# Patient Record
Sex: Male | Born: 1945 | Race: Black or African American | Hispanic: No | Marital: Single | Smoking: Never smoker
Health system: Southern US, Community
[De-identification: ages and names within clinical notes are randomized; demographics above are authoritative.]

## PROBLEM LIST (undated history)

## (undated) DIAGNOSIS — E058 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: Secondary | ICD-10-CM

## (undated) DIAGNOSIS — I639 Cerebral infarction, unspecified: Secondary | ICD-10-CM

## (undated) DIAGNOSIS — Z9981 Dependence on supplemental oxygen: Secondary | ICD-10-CM

## (undated) DIAGNOSIS — R279 Unspecified lack of coordination: Secondary | ICD-10-CM

## (undated) DIAGNOSIS — K649 Unspecified hemorrhoids: Secondary | ICD-10-CM

## (undated) DIAGNOSIS — K5900 Constipation, unspecified: Secondary | ICD-10-CM

## (undated) DIAGNOSIS — D72829 Elevated white blood cell count, unspecified: Secondary | ICD-10-CM

## (undated) DIAGNOSIS — J189 Pneumonia, unspecified organism: Secondary | ICD-10-CM

## (undated) DIAGNOSIS — E870 Hyperosmolality and hypernatremia: Secondary | ICD-10-CM

## (undated) DIAGNOSIS — I2699 Other pulmonary embolism without acute cor pulmonale: Secondary | ICD-10-CM

## (undated) DIAGNOSIS — R0981 Nasal congestion: Secondary | ICD-10-CM

## (undated) DIAGNOSIS — A419 Sepsis, unspecified organism: Secondary | ICD-10-CM

## (undated) DIAGNOSIS — I10 Essential (primary) hypertension: Secondary | ICD-10-CM

## (undated) DIAGNOSIS — H179 Unspecified corneal scar and opacity: Secondary | ICD-10-CM

## (undated) DIAGNOSIS — E1165 Type 2 diabetes mellitus with hyperglycemia: Secondary | ICD-10-CM

## (undated) DIAGNOSIS — M6281 Muscle weakness (generalized): Secondary | ICD-10-CM

## (undated) DIAGNOSIS — J9601 Acute respiratory failure with hypoxia: Secondary | ICD-10-CM

## (undated) DIAGNOSIS — R509 Fever, unspecified: Secondary | ICD-10-CM

## (undated) DIAGNOSIS — N390 Urinary tract infection, site not specified: Secondary | ICD-10-CM

## (undated) DIAGNOSIS — K760 Fatty (change of) liver, not elsewhere classified: Secondary | ICD-10-CM

## (undated) DIAGNOSIS — E785 Hyperlipidemia, unspecified: Secondary | ICD-10-CM

## (undated) DIAGNOSIS — K562 Volvulus: Secondary | ICD-10-CM

## (undated) DIAGNOSIS — R4702 Dysphasia: Secondary | ICD-10-CM

## (undated) DIAGNOSIS — Z741 Need for assistance with personal care: Secondary | ICD-10-CM

## (undated) DIAGNOSIS — G8190 Hemiplegia, unspecified affecting unspecified side: Secondary | ICD-10-CM

## (undated) HISTORY — DX: Constipation, unspecified: K59.00

## (undated) HISTORY — DX: Sepsis, unspecified organism: A41.9

## (undated) HISTORY — DX: Unspecified corneal scar and opacity: H17.9

## (undated) HISTORY — DX: Muscle weakness (generalized): M62.81

## (undated) HISTORY — DX: Hyperosmolality and hypernatremia: E87.0

## (undated) HISTORY — DX: 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: E05.80

## (undated) HISTORY — DX: Hyperlipidemia, unspecified: E78.5

## (undated) HISTORY — DX: Unspecified lack of coordination: R27.9

## (undated) HISTORY — DX: Nasal congestion: R09.81

## (undated) HISTORY — PX: OTHER SURGICAL HISTORY: SHX169

## (undated) HISTORY — DX: Elevated white blood cell count, unspecified: D72.829

## (undated) HISTORY — DX: Fever, unspecified: R50.9

## (undated) HISTORY — DX: Volvulus: K56.2

## (undated) HISTORY — DX: Dependence on supplemental oxygen: Z99.81

## (undated) HISTORY — DX: Unspecified hemorrhoids: K64.9

## (undated) HISTORY — DX: Acute respiratory failure with hypoxia: J96.01

## (undated) HISTORY — DX: Need for assistance with personal care: Z74.1

## (undated) HISTORY — DX: Urinary tract infection, site not specified: N39.0

## (undated) HISTORY — DX: Type 2 diabetes mellitus with hyperglycemia: E11.65

---

## 2002-05-25 ENCOUNTER — Encounter: Payer: Self-pay | Admitting: Internal Medicine

## 2002-05-25 ENCOUNTER — Emergency Department (HOSPITAL_COMMUNITY): Admission: EM | Admit: 2002-05-25 | Discharge: 2002-05-25 | Payer: Self-pay | Admitting: Internal Medicine

## 2002-12-04 ENCOUNTER — Encounter: Payer: Self-pay | Admitting: Internal Medicine

## 2002-12-04 ENCOUNTER — Ambulatory Visit (HOSPITAL_COMMUNITY): Admission: RE | Admit: 2002-12-04 | Discharge: 2002-12-04 | Payer: Self-pay | Admitting: Internal Medicine

## 2002-12-14 ENCOUNTER — Encounter: Payer: Self-pay | Admitting: *Deleted

## 2002-12-14 ENCOUNTER — Emergency Department (HOSPITAL_COMMUNITY): Admission: EM | Admit: 2002-12-14 | Discharge: 2002-12-15 | Payer: Self-pay | Admitting: *Deleted

## 2003-03-22 ENCOUNTER — Ambulatory Visit (HOSPITAL_COMMUNITY): Admission: RE | Admit: 2003-03-22 | Discharge: 2003-03-22 | Payer: Self-pay | Admitting: Internal Medicine

## 2003-03-22 ENCOUNTER — Encounter: Payer: Self-pay | Admitting: Internal Medicine

## 2003-03-27 ENCOUNTER — Ambulatory Visit (HOSPITAL_COMMUNITY): Admission: RE | Admit: 2003-03-27 | Discharge: 2003-03-27 | Payer: Self-pay | Admitting: Internal Medicine

## 2003-03-27 ENCOUNTER — Encounter: Payer: Self-pay | Admitting: Internal Medicine

## 2003-05-09 ENCOUNTER — Inpatient Hospital Stay: Admission: AD | Admit: 2003-05-09 | Discharge: 2003-07-26 | Payer: Self-pay | Admitting: Internal Medicine

## 2003-07-11 ENCOUNTER — Ambulatory Visit (HOSPITAL_COMMUNITY): Admission: RE | Admit: 2003-07-11 | Discharge: 2003-07-11 | Payer: Self-pay | Admitting: Internal Medicine

## 2003-07-26 ENCOUNTER — Inpatient Hospital Stay (HOSPITAL_COMMUNITY): Admission: EM | Admit: 2003-07-26 | Discharge: 2003-08-01 | Payer: Self-pay | Admitting: Emergency Medicine

## 2003-07-26 ENCOUNTER — Ambulatory Visit (HOSPITAL_COMMUNITY): Admission: RE | Admit: 2003-07-26 | Discharge: 2003-07-26 | Payer: Self-pay | Admitting: Internal Medicine

## 2003-08-01 ENCOUNTER — Inpatient Hospital Stay: Admission: AD | Admit: 2003-08-01 | Discharge: 2005-01-03 | Payer: Self-pay | Admitting: Internal Medicine

## 2003-09-10 ENCOUNTER — Emergency Department (HOSPITAL_COMMUNITY): Admission: EM | Admit: 2003-09-10 | Discharge: 2003-09-10 | Payer: Self-pay | Admitting: Emergency Medicine

## 2003-10-24 ENCOUNTER — Ambulatory Visit (HOSPITAL_COMMUNITY): Admission: RE | Admit: 2003-10-24 | Discharge: 2003-10-24 | Payer: Self-pay | Admitting: Internal Medicine

## 2003-11-04 ENCOUNTER — Ambulatory Visit (HOSPITAL_COMMUNITY): Admission: RE | Admit: 2003-11-04 | Discharge: 2003-11-04 | Payer: Self-pay | Admitting: Internal Medicine

## 2004-04-14 ENCOUNTER — Ambulatory Visit (HOSPITAL_COMMUNITY): Admission: RE | Admit: 2004-04-14 | Discharge: 2004-04-14 | Payer: Self-pay | Admitting: Internal Medicine

## 2004-04-16 ENCOUNTER — Ambulatory Visit (HOSPITAL_COMMUNITY): Admission: RE | Admit: 2004-04-16 | Discharge: 2004-04-16 | Payer: Self-pay | Admitting: Internal Medicine

## 2004-04-17 ENCOUNTER — Ambulatory Visit (HOSPITAL_COMMUNITY): Admission: RE | Admit: 2004-04-17 | Discharge: 2004-04-17 | Payer: Self-pay | Admitting: Internal Medicine

## 2004-04-20 ENCOUNTER — Ambulatory Visit (HOSPITAL_COMMUNITY): Admission: RE | Admit: 2004-04-20 | Discharge: 2004-04-20 | Payer: Self-pay | Admitting: Ophthalmology

## 2005-01-03 ENCOUNTER — Inpatient Hospital Stay (HOSPITAL_COMMUNITY): Admission: EM | Admit: 2005-01-03 | Discharge: 2005-01-11 | Payer: Self-pay | Admitting: Emergency Medicine

## 2005-01-11 ENCOUNTER — Inpatient Hospital Stay: Admission: AD | Admit: 2005-01-11 | Discharge: 2009-03-23 | Payer: Self-pay | Admitting: Internal Medicine

## 2005-01-11 DIAGNOSIS — I2782 Chronic pulmonary embolism: Secondary | ICD-10-CM

## 2005-01-18 ENCOUNTER — Emergency Department (HOSPITAL_COMMUNITY): Admission: EM | Admit: 2005-01-18 | Discharge: 2005-01-18 | Payer: Self-pay | Admitting: *Deleted

## 2005-08-23 ENCOUNTER — Ambulatory Visit (HOSPITAL_COMMUNITY): Admission: RE | Admit: 2005-08-23 | Discharge: 2005-08-23 | Payer: Self-pay | Admitting: Ophthalmology

## 2005-08-31 ENCOUNTER — Emergency Department (HOSPITAL_COMMUNITY): Admission: EM | Admit: 2005-08-31 | Discharge: 2005-08-31 | Payer: Self-pay | Admitting: Emergency Medicine

## 2005-09-08 ENCOUNTER — Ambulatory Visit (HOSPITAL_COMMUNITY): Admission: RE | Admit: 2005-09-08 | Discharge: 2005-09-08 | Payer: Self-pay | Admitting: Internal Medicine

## 2006-04-13 ENCOUNTER — Ambulatory Visit: Payer: Self-pay | Admitting: Internal Medicine

## 2006-05-18 ENCOUNTER — Ambulatory Visit: Payer: Self-pay | Admitting: Internal Medicine

## 2006-06-29 ENCOUNTER — Ambulatory Visit: Payer: Self-pay | Admitting: Internal Medicine

## 2006-08-10 ENCOUNTER — Ambulatory Visit: Payer: Self-pay | Admitting: Internal Medicine

## 2007-01-09 ENCOUNTER — Ambulatory Visit (HOSPITAL_COMMUNITY): Admission: RE | Admit: 2007-01-09 | Discharge: 2007-01-09 | Payer: Self-pay | Admitting: Ophthalmology

## 2007-06-22 ENCOUNTER — Encounter: Payer: Self-pay | Admitting: Family Medicine

## 2007-08-17 ENCOUNTER — Ambulatory Visit (HOSPITAL_COMMUNITY): Admission: RE | Admit: 2007-08-17 | Discharge: 2007-08-17 | Payer: Self-pay | Admitting: Internal Medicine

## 2007-11-18 ENCOUNTER — Ambulatory Visit (HOSPITAL_COMMUNITY): Admission: RE | Admit: 2007-11-18 | Discharge: 2007-11-18 | Payer: Self-pay | Admitting: Internal Medicine

## 2007-12-24 ENCOUNTER — Ambulatory Visit (HOSPITAL_COMMUNITY): Admission: RE | Admit: 2007-12-24 | Discharge: 2007-12-24 | Payer: Self-pay | Admitting: Internal Medicine

## 2007-12-28 ENCOUNTER — Emergency Department (HOSPITAL_COMMUNITY): Admission: EM | Admit: 2007-12-28 | Discharge: 2007-12-28 | Payer: Self-pay | Admitting: Emergency Medicine

## 2008-02-06 ENCOUNTER — Ambulatory Visit (HOSPITAL_COMMUNITY): Admission: RE | Admit: 2008-02-06 | Discharge: 2008-02-06 | Payer: Self-pay | Admitting: Internal Medicine

## 2008-02-06 ENCOUNTER — Encounter: Payer: Self-pay | Admitting: Orthopedic Surgery

## 2008-02-13 ENCOUNTER — Ambulatory Visit (HOSPITAL_COMMUNITY): Admission: RE | Admit: 2008-02-13 | Discharge: 2008-02-13 | Payer: Self-pay | Admitting: Internal Medicine

## 2008-02-14 ENCOUNTER — Ambulatory Visit: Payer: Self-pay | Admitting: Orthopedic Surgery

## 2008-02-14 DIAGNOSIS — M7580 Other shoulder lesions, unspecified shoulder: Secondary | ICD-10-CM

## 2008-06-04 ENCOUNTER — Ambulatory Visit (HOSPITAL_COMMUNITY): Admission: RE | Admit: 2008-06-04 | Discharge: 2008-06-04 | Payer: Self-pay | Admitting: Internal Medicine

## 2008-06-13 ENCOUNTER — Ambulatory Visit (HOSPITAL_COMMUNITY): Admission: RE | Admit: 2008-06-13 | Discharge: 2008-06-13 | Payer: Self-pay | Admitting: Internal Medicine

## 2008-06-22 LAB — HEMOGLOBIN A1C: Hemoglobin A1C: 6

## 2008-11-05 ENCOUNTER — Ambulatory Visit (HOSPITAL_COMMUNITY): Admission: RE | Admit: 2008-11-05 | Discharge: 2008-11-05 | Payer: Self-pay | Admitting: Internal Medicine

## 2008-11-08 ENCOUNTER — Ambulatory Visit (HOSPITAL_COMMUNITY): Admission: RE | Admit: 2008-11-08 | Discharge: 2008-11-08 | Payer: Self-pay | Admitting: Internal Medicine

## 2009-03-23 ENCOUNTER — Inpatient Hospital Stay (HOSPITAL_COMMUNITY): Admission: EM | Admit: 2009-03-23 | Discharge: 2009-03-27 | Payer: Self-pay | Admitting: Emergency Medicine

## 2009-03-27 ENCOUNTER — Inpatient Hospital Stay: Admission: AD | Admit: 2009-03-27 | Discharge: 2009-10-31 | Payer: Self-pay | Admitting: Internal Medicine

## 2009-04-26 LAB — HEMOGLOBIN A1C: Hgb A1c MFr Bld: 6 (ref 4.0–6.0)

## 2009-10-30 ENCOUNTER — Ambulatory Visit (HOSPITAL_COMMUNITY): Admission: RE | Admit: 2009-10-30 | Discharge: 2009-10-30 | Payer: Self-pay | Admitting: Internal Medicine

## 2009-10-31 ENCOUNTER — Inpatient Hospital Stay (HOSPITAL_COMMUNITY): Admission: EM | Admit: 2009-10-31 | Discharge: 2009-11-03 | Payer: Self-pay | Admitting: Emergency Medicine

## 2009-11-03 ENCOUNTER — Inpatient Hospital Stay
Admission: AD | Admit: 2009-11-03 | Discharge: 2012-02-03 | Disposition: A | Discharge status: Nursing Home (Includes ICF) | Payer: Self-pay | Source: Home / Self Care | Attending: Internal Medicine | Admitting: Internal Medicine

## 2010-07-12 ENCOUNTER — Encounter: Payer: Self-pay | Admitting: Internal Medicine

## 2010-07-23 NOTE — Letter (Signed)
Summary: rpc chart  rpc chart   Imported By: Curtis Sites 02/06/2010 11:25:19  _____________________________________________________________________  External Attachment:    Type:   Image     Comment:   External Document

## 2010-09-07 LAB — CBC
HCT: 41 % (ref 39.0–52.0)
MCHC: 34 g/dL (ref 30.0–36.0)
MCHC: 34.4 g/dL (ref 30.0–36.0)
MCV: 91.3 fL (ref 78.0–100.0)
Platelets: 254 10*3/uL (ref 150–400)
RBC: 4.45 MIL/uL (ref 4.22–5.81)
RDW: 13.5 % (ref 11.5–15.5)

## 2010-09-07 LAB — PROTIME-INR: Prothrombin Time: 24.3 seconds — ABNORMAL HIGH (ref 11.6–15.2)

## 2010-09-07 LAB — DIFFERENTIAL
Basophils Absolute: 0 10*3/uL (ref 0.0–0.1)
Basophils Absolute: 0 10*3/uL (ref 0.0–0.1)
Basophils Relative: 0 % (ref 0–1)
Eosinophils Relative: 5 % (ref 0–5)
Lymphs Abs: 1.4 10*3/uL (ref 0.7–4.0)
Monocytes Relative: 11 % (ref 3–12)
Neutro Abs: 5.6 10*3/uL (ref 1.7–7.7)
Neutrophils Relative %: 67 % (ref 43–77)

## 2010-09-07 LAB — BASIC METABOLIC PANEL
BUN: 13 mg/dL (ref 6–23)
BUN: 14 mg/dL (ref 6–23)
CO2: 27 mEq/L (ref 19–32)
Calcium: 8.4 mg/dL (ref 8.4–10.5)
Chloride: 103 mEq/L (ref 96–112)
GFR calc Af Amer: >60 mL/min (ref >60)
Potassium: 4.1 mEq/L (ref 3.5–5.1)
Sodium: 137 mEq/L (ref 135–145)

## 2010-09-07 LAB — VANCOMYCIN, TROUGH: Vancomycin Tr: 8.4 ug/mL — ABNORMAL LOW (ref 10.0–20.0)

## 2010-09-08 LAB — CBC
HCT: 41 % (ref 39.0–52.0)
Hemoglobin: 14.1 g/dL (ref 13.0–17.0)
Hemoglobin: 15.2 g/dL (ref 13.0–17.0)
MCHC: 34.3 g/dL (ref 30.0–36.0)
MCHC: 34.3 g/dL (ref 30.0–36.0)
MCV: 91.5 fL (ref 78.0–100.0)
RBC: 4.48 MIL/uL (ref 4.22–5.81)
RBC: 4.84 MIL/uL (ref 4.22–5.81)
WBC: 11.4 10*3/uL — ABNORMAL HIGH (ref 4.0–10.5)

## 2010-09-08 LAB — DIFFERENTIAL
Basophils Relative: 0 % (ref 0–1)
Basophils Relative: 0 % (ref 0–1)
Eosinophils Absolute: 0.1 10*3/uL (ref 0.0–0.7)
Eosinophils Relative: 1 % (ref 0–5)
Eosinophils Relative: 5 % (ref 0–5)
Lymphs Abs: 0.9 10*3/uL (ref 0.7–4.0)
Monocytes Absolute: 0.9 10*3/uL (ref 0.1–1.0)
Monocytes Relative: 10 % (ref 3–12)
Monocytes Relative: 7 % (ref 3–12)
Neutro Abs: 6.4 10*3/uL (ref 1.7–7.7)

## 2010-09-08 LAB — BASIC METABOLIC PANEL
CO2: 27 mEq/L (ref 19–32)
CO2: 28 mEq/L (ref 19–32)
Calcium: 8 mg/dL — ABNORMAL LOW (ref 8.4–10.5)
Calcium: 8.6 mg/dL (ref 8.4–10.5)
Chloride: 101 mEq/L (ref 96–112)
Chloride: 101 mEq/L (ref 96–112)
GFR calc Af Amer: 50 mL/min — ABNORMAL LOW (ref >60)
GFR calc Af Amer: >60 mL/min (ref >60)
Potassium: 3.9 mEq/L (ref 3.5–5.1)
Sodium: 134 mEq/L — ABNORMAL LOW (ref 135–145)
Sodium: 136 mEq/L (ref 135–145)

## 2010-09-08 LAB — CULTURE, BLOOD (ROUTINE X 2)
Culture: NO GROWTH
Culture: NO GROWTH
Report Status: 5182011

## 2010-09-08 LAB — POCT CARDIAC MARKERS: Myoglobin, poc: 211 ng/mL (ref 12–200)

## 2010-09-24 LAB — GLUCOSE, CAPILLARY
Glucose-Capillary: 100 mg/dL — ABNORMAL HIGH (ref 70–99)
Glucose-Capillary: 101 mg/dL — ABNORMAL HIGH (ref 70–99)
Glucose-Capillary: 115 mg/dL — ABNORMAL HIGH (ref 70–99)
Glucose-Capillary: 119 mg/dL — ABNORMAL HIGH (ref 70–99)
Glucose-Capillary: 124 mg/dL — ABNORMAL HIGH (ref 70–99)
Glucose-Capillary: 125 mg/dL — ABNORMAL HIGH (ref 70–99)
Glucose-Capillary: 153 mg/dL — ABNORMAL HIGH (ref 70–99)
Glucose-Capillary: 160 mg/dL — ABNORMAL HIGH (ref 70–99)
Glucose-Capillary: 162 mg/dL — ABNORMAL HIGH (ref 70–99)
Glucose-Capillary: 79 mg/dL (ref 70–99)
Glucose-Capillary: 86 mg/dL (ref 70–99)
Glucose-Capillary: 88 mg/dL (ref 70–99)
Glucose-Capillary: 88 mg/dL (ref 70–99)
Glucose-Capillary: 91 mg/dL (ref 70–99)
Glucose-Capillary: 94 mg/dL (ref 70–99)

## 2010-09-24 LAB — CBC
HCT: 41.8 % (ref 39.0–52.0)
HCT: 42.8 % (ref 39.0–52.0)
Hemoglobin: 14 g/dL (ref 13.0–17.0)
Hemoglobin: 14.5 g/dL (ref 13.0–17.0)
Hemoglobin: 16.5 g/dL (ref 13.0–17.0)
MCHC: 33.4 g/dL (ref 30.0–36.0)
MCHC: 33.8 g/dL (ref 30.0–36.0)
MCHC: 33.9 g/dL (ref 30.0–36.0)
MCHC: 33.9 g/dL (ref 30.0–36.0)
MCV: 92.8 fL (ref 78.0–100.0)
MCV: 93.5 fL (ref 78.0–100.0)
MCV: 94.2 fL (ref 78.0–100.0)
Platelets: 251 K/uL (ref 150–400)
Platelets: 257 K/uL (ref 150–400)
RBC: 4.44 MIL/uL (ref 4.22–5.81)
RBC: 4.61 MIL/uL (ref 4.22–5.81)
RBC: 5.22 MIL/uL (ref 4.22–5.81)
RDW: 13.2 % (ref 11.5–15.5)
RDW: 13.3 % (ref 11.5–15.5)
RDW: 13.6 % (ref 11.5–15.5)
WBC: 10.2 K/uL (ref 4.0–10.5)
WBC: 10.5 K/uL (ref 4.0–10.5)

## 2010-09-24 LAB — PROTIME-INR
INR: 2.3 — ABNORMAL HIGH (ref 0.00–1.49)
INR: 2.4 — ABNORMAL HIGH (ref 0.00–1.49)
INR: 2.8 — ABNORMAL HIGH (ref 0.00–1.49)
INR: 2.83 — ABNORMAL HIGH (ref 0.00–1.49)
INR: 3.25 — ABNORMAL HIGH (ref 0.00–1.49)
Prothrombin Time: 25.1 seconds — ABNORMAL HIGH (ref 11.6–15.2)
Prothrombin Time: 29.3 s — ABNORMAL HIGH (ref 11.6–15.2)
Prothrombin Time: 29.5 s — ABNORMAL HIGH (ref 11.6–15.2)
Prothrombin Time: 32.9 s — ABNORMAL HIGH (ref 11.6–15.2)

## 2010-09-24 LAB — COMPREHENSIVE METABOLIC PANEL WITH GFR
ALT: 142 U/L — ABNORMAL HIGH (ref 0–53)
ALT: 89 U/L — ABNORMAL HIGH (ref 0–53)
AST: 39 U/L — ABNORMAL HIGH (ref 0–37)
AST: 82 U/L — ABNORMAL HIGH (ref 0–37)
Albumin: 3.2 g/dL — ABNORMAL LOW (ref 3.5–5.2)
Albumin: 3.2 g/dL — ABNORMAL LOW (ref 3.5–5.2)
Alkaline Phosphatase: 68 U/L (ref 39–117)
Alkaline Phosphatase: 68 U/L (ref 39–117)
BUN: 12 mg/dL (ref 6–23)
BUN: 14 mg/dL (ref 6–23)
CO2: 27 meq/L (ref 19–32)
CO2: 29 meq/L (ref 19–32)
Calcium: 8.5 mg/dL (ref 8.4–10.5)
Calcium: 8.6 mg/dL (ref 8.4–10.5)
Chloride: 105 meq/L (ref 96–112)
Chloride: 105 meq/L (ref 96–112)
Creatinine, Ser: 1.09 mg/dL (ref 0.4–1.5)
Creatinine, Ser: 1.24 mg/dL (ref 0.4–1.5)
GFR calc non Af Amer: 59 mL/min — ABNORMAL LOW
GFR calc non Af Amer: >60 mL/min
Glucose, Bld: 86 mg/dL (ref 70–99)
Glucose, Bld: 93 mg/dL (ref 70–99)
Potassium: 3.7 meq/L (ref 3.5–5.1)
Potassium: 3.9 meq/L (ref 3.5–5.1)
Sodium: 138 meq/L (ref 135–145)
Sodium: 140 meq/L (ref 135–145)
Total Bilirubin: 1 mg/dL (ref 0.3–1.2)
Total Bilirubin: 1.1 mg/dL (ref 0.3–1.2)
Total Protein: 6.9 g/dL (ref 6.0–8.3)
Total Protein: 7 g/dL (ref 6.0–8.3)

## 2010-09-24 LAB — DIFFERENTIAL
Basophils Absolute: 0 10*3/uL (ref 0.0–0.1)
Basophils Absolute: 0 K/uL (ref 0.0–0.1)
Basophils Relative: 0 % (ref 0–1)
Basophils Relative: 0 % (ref 0–1)
Basophils Relative: 0 % (ref 0–1)
Eosinophils Absolute: 0 10*3/uL (ref 0.0–0.7)
Eosinophils Absolute: 0.1 10*3/uL (ref 0.0–0.7)
Eosinophils Absolute: 0.3 K/uL (ref 0.0–0.7)
Eosinophils Relative: 3 % (ref 0–5)
Eosinophils Relative: 3 % (ref 0–5)
Lymphocytes Relative: 19 % (ref 12–46)
Lymphs Abs: 1.3 10*3/uL (ref 0.7–4.0)
Lymphs Abs: 2 K/uL (ref 0.7–4.0)
Monocytes Absolute: 0.7 K/uL (ref 0.1–1.0)
Monocytes Absolute: 0.8 10*3/uL (ref 0.1–1.0)
Monocytes Absolute: 0.8 10*3/uL (ref 0.1–1.0)
Monocytes Relative: 5 % (ref 3–12)
Monocytes Relative: 6 % (ref 3–12)
Monocytes Relative: 7 % (ref 3–12)
Neutro Abs: 7.2 K/uL (ref 1.7–7.7)
Neutrophils Relative %: 71 % (ref 43–77)
Neutrophils Relative %: 84 % — ABNORMAL HIGH (ref 43–77)

## 2010-09-24 LAB — HEPATITIS PANEL, ACUTE
Hep A IgM: NEGATIVE
Hep B C IgM: NEGATIVE

## 2010-09-24 LAB — HEMOGLOBIN A1C
Hgb A1c MFr Bld: 6 % (ref 4.6–6.1)
Mean Plasma Glucose: 126 mg/dL

## 2010-09-24 LAB — APTT
aPTT: 40 seconds — ABNORMAL HIGH (ref 24–37)
aPTT: 40 seconds — ABNORMAL HIGH (ref 24–37)

## 2010-09-24 LAB — COMPREHENSIVE METABOLIC PANEL
ALT: 212 U/L — ABNORMAL HIGH (ref 0–53)
AST: 47 U/L — ABNORMAL HIGH (ref 0–37)
BUN: 14 mg/dL (ref 6–23)
CO2: 29 mEq/L (ref 19–32)
Calcium: 8.8 mg/dL (ref 8.4–10.5)
Chloride: 106 mEq/L (ref 96–112)
Creatinine, Ser: 1.19 mg/dL (ref 0.4–1.5)
GFR calc Af Amer: >60 mL/min (ref >60)
GFR calc Af Amer: >60 mL/min (ref >60)
GFR calc non Af Amer: >60 mL/min (ref >60)
Glucose, Bld: 168 mg/dL — ABNORMAL HIGH (ref 70–99)
Sodium: 137 mEq/L (ref 135–145)
Total Bilirubin: 0.9 mg/dL (ref 0.3–1.2)
Total Protein: 7 g/dL (ref 6.0–8.3)

## 2010-09-24 LAB — CARDIAC PANEL(CRET KIN+CKTOT+MB+TROPI)
CK, MB: 1.9 ng/mL (ref 0.3–4.0)
CK, MB: 2 ng/mL (ref 0.3–4.0)
Relative Index: 1.6 (ref 0.0–2.5)
Total CK: 122 U/L (ref 7–232)
Total CK: 126 U/L (ref 7–232)
Troponin I: 0.04 ng/mL (ref 0.00–0.06)

## 2010-09-24 LAB — BASIC METABOLIC PANEL
CO2: 29 mEq/L (ref 19–32)
Chloride: 103 mEq/L (ref 96–112)
Creatinine, Ser: 1.25 mg/dL (ref 0.4–1.5)
GFR calc Af Amer: >60 mL/min (ref >60)
Sodium: 136 mEq/L (ref 135–145)

## 2010-09-24 LAB — PHOSPHORUS: Phosphorus: 2.3 mg/dL (ref 2.3–4.6)

## 2010-11-03 NOTE — Consult Note (Signed)
[name] [name], DILLS             ACCOUNT NO.:  [id number]   MEDICAL RECORD NO.:  [id number]          PATIENT TYPE:  EMS   LOCATION:  ED                            FACILITY:  [facility]   PHYSICIAN:  Dalia Heading, M.D.  DATE OF BIRTH:  08-24-1945   DATE OF CONSULTATION:  12/28/2007  DATE OF DISCHARGE:  12/28/2007                                 CONSULTATION   REASON FOR CONSULTATION:  Cholelithiasis, question cholecystitis.   HISTORY OF PRESENT ILLNESS:  The patient is a 65 year old black male who  resides at the [facility] in [place] who was referred to the  emergency room for further evaluation and treatment due to nonspecific  lower abdominal pain.  He states that he has had no nausea or vomiting.  He describes this nonspecific transitory right-sided abdominal pain,  though points to the right lower portion of the abdomen.  He denies any  fever, chills, or jaundice.  He is hungry and is requesting something to  eat.   PAST MEDICAL HISTORY:  1. CHF.  2. CVA with right-sided hemiplegia, partial.  3. History of pulmonary embolus with DVT.  4. Hypertension.  5. Chronic constipation.  6. Diabetes mellitus.   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   CURRENT MEDICATIONS:  Zestril, Spiriva, Normodyne, lactulose, Coumadin,  Aqua Tears, Lipitor, Vicodin, Tylenol, Antivert, Claritin, Levaquin, and  Mucinex.   ALLERGIES:  No known drug allergies.   REVIEW OF SYSTEMS:  Noncontributory.   PHYSICAL EXAMINATION:  The patient is a pleasant black male who is able  to talk with some slurring but is understandable.  His temperature is 98.9, blood pressure 154/94, respirations 24, and  pulse 89.  HEENT examination reveals sight in both eyes, though the left eye has  movement dysfunction.  No scleral icterus is noted.  Lung exam reveals diffuse rhonchi in both lung fields.  Heart examination reveals regular rate and rhythm without S3, S4, or  murmurs.  The abdomen is  distended to soft.  No specific tenderness is noted,  especially in the right upper quadrant.  No hepatosplenomegaly, masses,  or hernia is identified.   White blood cell count 13.1, hematocrit 43, platelet count 395.  MET-7  is unremarkable.  SGOT and SGPT are within normal limits., Lipase is  within normal limits.  Albumin is 2.9.   Ultrasound of the gallbladder revealed question nonmobile gallstones  versus gallbladder polyps.  No other definite upper abnormality was  noted.  No pericholecystic fluid was noted.  No Murphy's sign was noted.  Chest x-ray revealed bibasilar atelectasis with very low lung volumes.  CT scan of the abdomen and pelvis revealed questionable upper limit of  normal gallbladder wall thickening, but again no pericholecystic fluid.  Gaseous distention of the colon was noted.   INR is 2.5.   IMPRESSION:  Abdominal pain of unknown etiology, resolved.  At this  point, I doubt that the patient is having acute cholecystitis given the  physical findings and x-ray examinations.  Given his multiple medical  problems, more positive results would be needed prior to any further  surgical intervention.   PLAN:  Given the patient's multiple medical problems, he is undergoing a  breathing treatment.  He will be evaluated by Dr. Lilian Kapur of [facility]  to see whether or not he needs any further medical intervention.  If he  does not need further medical intervention, he would be transferred back  to the [facility].  Should any questions arise given or concerning his  abdomen, please do not hesitate to call me.      Dalia Heading, M.D.  Electronically Signed     MAJ/MEDQ  D:  12/28/2007  T:  12/29/2007  Job:  [id number]   cc:   [facility]

## 2010-11-03 NOTE — Consult Note (Signed)
[name] [name], [name] [name]             ACCOUNT NO.:  [id number]   MEDICAL RECORD NO.:  [id number]          PATIENT TYPE:  EMS   LOCATION:  ED                            FACILITY:  [facility]   PHYSICIAN:  Skeet Latch, DO    DATE OF BIRTH:  1946-06-21   DATE OF CONSULTATION:  12/28/2007  DATE OF DISCHARGE:  12/28/2007                                 CONSULTATION   CHIEF COMPLAINT:  Abdominal pain.   HISTORY OF PRESENT ILLNESS:  This is a 65 year old male who presents to  the ER with abdominal pain.  [facility]  and was brought to [facility] ER with concerns of abdominal pain.  Abdominal pain is located in his right abdomen.  Upon the exam in the  emergency room, the patient had acute abdominal series performed, which  showed a very little lung volumes with bibasilar atelectasis greater on  left, next light gaseous distention of sigmoid colon, which decompresses  on left lateral decubitus view, and slightly increased stool present in  half the colon without definite signs of bowel obstruction and also  recommended a CT of the abdomen and pelvis.  The patient then had an  ultrasound performed, which was limited due to the bowel gas causing  nonvisualization of multiple intraabdominal contents, question of  nonmobile gallstones versus gallbladder polyps.  CT of his abdomen and  pelvis had questionable cholecystitis.  General surgery was consulted.  I feel that the patient does not need a surgical intervention at this  time.  The patient has been not having severe abdominal pain while in  the emergency room.  He has not received any pain medications secondary  to him complaining of major abdominal pain other than when you have deep  palpation.   PAST MEDICAL HISTORY:  1. CVA with right-sided hemiplegia.  2. History of pulmonary embolus.  3. History of hypertension.  4. Dysphagia.  5. Congestive heart failure.  6. Diabetes.   ALLERGIES:  PENICILLIN.   MEDICATIONS:  1. Lipitor 10 mg daily.  2. Vicodin 5/500 q.6h. as needed.  3. Robitussin DM 15 mL q.4 h. as needed.  4. Fleet Enema as needed.  5. Tylenol 500 mg q.6 h. as needed.  6. Antivert 25 mg 4 times daily as needed.  7. Claritin 10 mg as needed.  8. DuoNeb q.4h. as needed.  9. Lisinopril 10 mg daily.  10.Spiriva 1 inhalation daily.  11.Labetalol 200 mg twice a day.  12.Lactulose 30 mL twice a day.  13.Coumadin 3 mg daily.  14.Aqua Tears apply to left eye four times a day.  15.Levaquin 500 mg daily for 7 days, starting on December 24, 2007.  16.Mucinex 1200 mg b.i.d. for 10 days, started on December 25, 2007.   REVIEW OF SYSTEMS:  Positive for abdominal pain.  NEURO:  Slurred speech  from stroke.  MUSCULOSKELETAL:  Right leg weakness.   PHYSICAL EXAMINATION:  GENERAL:  He is a well developed, well nourished,  well hydrated in no acute distress.  HEENT:  Atraumatic and normocephalic.  NECK:  Soft, supple, nontender, nondistended.  CARDIOVASCULAR:  Regular rate and rhythm.  No murmurs, rubs, or gallops.  RESPIRATORY:  Lungs, coarse breath sounds heard bilaterally, no wheeze.  ABDOMEN:  He has some generalized lower abdominal discomfort on deep  palpation.  No rigidity or guarding.  Positive bowel sounds.  NEURO:  Cranial nerves II-XII grossly intact.  PSYCHIATRIC:  The patient is alert and oriented x3.   LABORATORY DATA:  Lipase is 20, sodium 136, potassium 4.7, chloride 102,  PCO2 27, glucose 116, BUN 18, creatinine 1.46, and calcium 9.9.  White  count 13.1, hemoglobin 14.5, hematocrit 43.0, and platelet count 395.   RADIOLOGIC STUDIES:  Please see HPI.   ASSESSMENT:  1. Abdominal pain.  2. History of cerebrovascular accident.  3. History of hypertension.  4. History of diabetes.  5. History of congestive heart failure.  6. History of chronic constipation.  7. History of questionable pneumonia, back on December 24, 2007, for chest      x-ray.   PLAN:  At this time, we will send  the [facility] back to [facility].  We  will increase his dose of Levaquin to 750 mg at this time.  The patient  does have leukocytosis and his white count needs to be repeated in next  2 days to see if it decreases.  The patient should be continued on  nebulized treatments every 4-6 hours as needed.  Also, we will continue  him on his Mucinex twice a day.  For his abdominal pain, I again spoke  with general surgery, no intervention is needed at this time.  The  patient probably needs a good bowel regimen.  We will continue  lactulose.  The patient probably needs enemas as needed for pain as well  as possibly some MiraLax on a daily basis.  If the patient continues to  have increasing abdominal pain and his breathing does not improve, the  patient can be brought back to the emergency room for another  evaluation.     Skeet Latch, DO  Electronically Signed    SM/MEDQ  D:  12/28/2007  T:  12/29/2007  Job:  [id number]

## 2010-11-06 NOTE — Group Therapy Note (Signed)
[name] [name], [name] [name]             ACCOUNT NO.:  [id number]   MEDICAL RECORD NO.:  [id number]          PATIENT TYPE:  INP   LOCATION:  A209                          FACILITY:  [facility]   PHYSICIAN:  Edward L. Juanetta Gosling, M.D.DATE OF BIRTH:  Jun 28, 1945   DATE OF PROCEDURE:  01/08/2005  DATE OF DISCHARGE:                                   PROGRESS NOTE   PROBLEM LIST:  1.  Pneumonia.  2.  Hypoxemia.  3.  Renal failure.  4.  Status post stroke.   SUBJECTIVE:  [name] seems to be improving.  He has significant response  to Lasix.  The difficulty discussed with Dr. Felecia Shelling is that his Lasix may of  course interfere with his renal function, so it is a balance between his  chest and his renal function.  Otherwise, [name] [name] says he is doing well.   OBJECTIVE:  VITAL SIGNS:  Temperature is 97.7, pulse 84, respirations 20,  blood pressure 112/80.  His O2 saturations 95% on 4 liters.  EXTREMITIES:  He did show some old clot in his leg which we new he had.   ASSESSMENT/PLAN:  His D-dimer is only very slightly elevated and since he  has improved so much clinically,  I do not think he has clinically  significant pulmonary emboli.  I have discussed this at length with Dr.  Felecia Shelling and the plan is to go ahead with his current treatments.  Hold on  anticoagulation and follow.  I am going to be out-of-town over the weekend,  but will be on Monday, July 24.       ELH/MEDQ  D:  01/08/2005  T:  01/08/2005  Job:  [id number]

## 2010-11-06 NOTE — H&P (Signed)
[name] [name] [name]             ACCOUNT NO.:  [id number]   MEDICAL RECORD NO.:  [id number]          PATIENT TYPE:  INP   LOCATION:  A306                          FACILITY:  [facility]   PHYSICIAN:  Tesfaye D. Felecia Shelling, MD   DATE OF BIRTH:  06-07-1946   DATE OF ADMISSION:  01/03/2005  DATE OF DISCHARGE:  LH                                HISTORY & PHYSICAL   CHIEF COMPLAINT:  Weakness and change in mental status.   HISTORY OF PRESENT ILLNESS:  This is a 65 year old male patient with history  of multiple medical illnesses who is currently a resident of [facility] and  brought to the emergency room with above complaint.  The patient was in his  usual state of health until the day of admission when the patient was found  to have more weakness and was unable to feed himself.  He was also more  confused and disoriented.  The patient also had a low-grade fever.  He was  brought to the emergency room where he was evaluated.  His white blood test  showed a leukocytosis of 27,000.  His urinalysis was also abnormal.  The  patient had hypotension with blood pressure ranging systolically below 80.  The patient was given a bolus of fluid and started on IV antibiotics.  He  was admitted as a case of possible urosepsis.   REVIEW OF SYSTEMS:  Currently lethargic and unable to give any history.   PAST MEDICAL HISTORY:  1.  Cerebrovascular accident and right-sided hemiplegia.  2.  History of pulmonary embolism.  3.  History of pneumonia.  4.  Hypertension.  5.  Dysphagia.  6.  History of respiratory failure.   CURRENT MEDICATIONS:  1.  Senokot one tablet p.o. daily.  2.  Colace one tablet daily.  3.  Norvasc 5 mg p.o. daily.  4.  Prevacid 20 mg p.o. daily.  5.  Restoril 10 mg daily.  6.  MiraLax 17 g p.o. daily.  7.  Normodyne 300 mg p.o. b.i.d.  8.  Coumadin 3 mg p.o. daily alternating with 2.5 mg p.o. daily.   SOCIAL HISTORY:  The patient is currently a resident of nursing home.  He  has no  recent history of alcohol or substance abuse.   PHYSICAL EXAMINATION:  GENERAL:  The patient is acutely sick-looking and  lethargic.  VITAL SIGNS:  Blood pressure 87/58, pulse 90, respirations 24, temperature  99.8 degrees Fahrenheit.  HEENT:  Enucleation of the left eye.  NECK:  Supple.  CHEST:  Fair air entry.  Few rhonchi.  HEART:  S1, S2 heard.  No gallops.  ABDOMEN:  Bowel sounds positive.  No organomegaly.  EXTREMITIES:  Left-sided hemiplegia.   LABORATORY DATA AND X-RAY FINDINGS:  On admission, WBC with CBC 27.8,  hemoglobin 12.9, hematocrit 37.6 and platelets 388.  PT 13.3, INR 2.9.  Sodium 135, potassium 4.8, chloride 100, CO2 25, BUN 32, glucose 161,  creatinine 3.1.  Total bilirubin 0.2, Alk phos 67, AST 19, ALT 19, total  protein 7.5, albumin 2.8, calcium 8.2.  Urinalysis with specific gravity  1.030, pH  5.0, wbc 11-20, bacteria many.   ASSESSMENT:  1.  Urosepsis with hypotension.  2.  Acute renal failure.  3.  History of cerebrovascular accident with left-sided hemiplegia.  4.  History of pulmonary embolism on anticoagulation.  5.  Pneumonia.  6.  History of hypertension.   PLAN:  1.  Will continue patient on IV fluids.  2.  Will monitor his electrolytes.  3.  Continue IV antibiotics.  4.  Will do nephrology consult.  5.  Will continue the patient on his regular medications.  6.  Will follow culture results.       TDF/MEDQ  D:  01/04/2005  T:  01/04/2005  Job:  [id number]

## 2010-11-06 NOTE — Group Therapy Note (Signed)
NAMEFERMAN, BASILIO             ACCOUNT NO.:  [id number]   MEDICAL RECORD NO.:  [id number]          PATIENT TYPE:  INP   LOCATION:  A322                          FACILITY:  [facility]   PHYSICIAN:  Edward L. Juanetta Gosling, M.D.DATE OF BIRTH:  July 30, 1945   DATE OF PROCEDURE:  01/07/2005  DATE OF DISCHARGE:                                   PROGRESS NOTE   PROBLEM LIST:  1.  Pneumonia.  2.  Hypoxemia.  3.  Possible pulmonary embolus.   SUBJECTIVE:  Mr. Shropshire says he is doing a little better.  He has no new  complaints.  He is a little more short of breath and the nursing staff had  called thinking he had flash pulmonary edema and ask for another dose of  Lasix and I did give them that order.  Otherwise, his lung scan yesterday  showed some perfusion defects in the right lung which is not where is  pneumonias.  However, his oxygenation is better.   OBJECTIVE:  VITAL SIGNS:  His temperature 98.6, pulse 81, respirations 18,  blood pressure 101/71, O2 saturations 98% now on 50% O2.   His chest x-ray did look a little worse.   PLAN:  Considering all that, rather than put him through a filter unless we  have a little more evidence of a embolus, especially since he has had  improving oxygenation, I have ordered a D-dimer which was 1.38, that is  slightly high, and a venous study of his legs.  If his venous study does not  show anything then I think we should probably hold on having him undergo a  filter unless we have some other evidence of a embolus.       ELH/MEDQ  D:  01/07/2005  T:  01/07/2005  Job:  [id number]

## 2010-11-06 NOTE — Consult Note (Signed)
NAMECOOLIDGE, GOSSARD             ACCOUNT NO.:  [id number]   MEDICAL RECORD NO.:  [id number]          PATIENT TYPE:  INP   LOCATION:  A322                          FACILITY:  [facility]   PHYSICIAN:  Edward L. Juanetta Gosling, M.D.DATE OF BIRTH:  [birth year]   DATE OF CONSULTATION:  01/06/2005  DATE OF DISCHARGE:                                   CONSULTATION   REFERRING PHYSICIAN:  Tesfaye D. Felecia Shelling, M.D.   REASON FOR CONSULTATION:  Hypoxemia.   HISTORY OF PRESENT ILLNESS:  This is a 65 year old, African-American male  who lives at the [facility] and who is brought to the emergency room  because of weakness and change in mental status.  He was not able to feed  himself, was confused, disoriented and had low-grade fever.  When he was  seen in the emergency room, his white blood count was 27,000, urinalysis was  abnormal and he had possible urosepsis, but also has an abnormal chest x-  ray.  Since that time, he has had been markedly hypoxemic and difficult to  oxygenate.   PAST MEDICAL HISTORY:  1.  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  2.  Pulmonary embolism in the past.  3.  Pneumonia in the past.  4.  Hypertension.  5.  Dysphagia.  6.  Respiratory failure.   CURRENT MEDICATIONS:  At the nursing home, Senokot, Colace, Norvasc,  Prevacid, Restoril, MiraLax, Normodyne, Coumadin.   SOCIAL HISTORY:  He has no use of tobacco or alcohol in several years, but  apparently did use tobacco to some extent before that.   PHYSICAL EXAMINATION:  HEENT:  Left eye of his glasses darkened because he  has difficulty with vision.  Mucous membranes are dry.  Nose and throat are  clear.  GENERAL:  He looks acutely ill.  NECK:  Supple without masses.  CHEST:  Fairly clear with markedly decreased breath sounds.  HEART:  Regular.  ABDOMEN:  Soft.  He has left hemiparesis.  GENITOURINARY:  He apparently had worsening renal failure which has been  improving.   ASSESSMENT:  He has hypoxemia.  He does have an  infiltrate on chest x-ray.  He is on 100% oxygen and his oxygen saturations have been better.   RECOMMENDATIONS:  I think we can try to taper his oxygen.  Even though he is  anticoagulated, I think we need to go ahead and get a CT of the chest to  rule out pulmonary embolus, but with his renal function, we are not going to  be able to do that.  I do not think he will be able to fully cooperate for a  ventilation perfusion scan, so I think we should simply treat him as if he  has a pulmonary embolus at this point and perhaps go back later with a  diagnostic look.  The difficulty being that if he has pulmonary emboli on  full anticoagulation, of course, he is going to need a filter.  I will  discuss with radiology the possibility of a ventilation perfusion lung scan,  but I am concerned that he may not be able to do  that.  I want to go ahead  and tentatively write an order and try to reduce his FIO2.  Continue his  other treatments and follow.       ELH/MEDQ  D:  01/06/2005  T:  01/06/2005  Job:  [id number]

## 2010-11-06 NOTE — Group Therapy Note (Signed)
NAMECOHL, BEHRENS             ACCOUNT NO.:  [id number]   MEDICAL RECORD NO.:  [id number]           PATIENT TYPE:  ORB   LOCATION:  S112                          FACILITY:  [facility]   PHYSICIAN:  Hanley Hays. Dechurch, M.D.DATE OF BIRTH:  1946-05-10   DATE OF PROCEDURE:  DATE OF DISCHARGE:                                   PROGRESS NOTE   SUBJECTIVE:  Mr. Obara is seen for routine followup. Overall, he has  remained clinically stable without any new events. He notes that he is  having difficulty hearing, seems to be more the left ear than the right ear,  though he notes both are depressed. He states he has been seen in the past  for his ears, though I do not have any records pertaining to such. He states  he is sleeping well. His p.o. intake has been good. His weight has been  stable. He denies any bowel or bladder complaints. Medication regimen is  reviewed.   OBJECTIVE:  VITAL SIGNS:  Systolic blood pressure is 140/70, pulse is 68 and  regular, respirations are unlabored.  LUNGS:  Clear though diminished.  HEART:  Regular without murmur.  ABDOMEN:  Obese.  EXTREMITIES:  Without clubbing or cyanosis. He has some trace edema on the  left side. He had the left hemiparesis.  HEENT:  His left eye, he is unable to close completely, but no evidence of  irritation. Unfortunately, I am unable to do an ear exam as the otoscope is  not functioning.   ASSESSMENT AND PLAN:  1.  History of cerebral vascular accident with residual left hemiparesis,      remains clinically stable.  2.  Chronic anticoagulation therapy, stable on the same dose, followup has      been arranged.  3.  History of chronic renal insufficiency, will followup BMP.  4.  Hypertension, good control. He may benefit from changing his Norvasc and      Zestril to [place] which may reduce the pill load. I will have to see if      it is less expensive, and if so, we will make that change.  5.  Decrease hearing. Will schedule an  audiology evaluation.  6.  History of anemia. His hemoglobin is 12.9. Will not make any further      changes in his regimen at this time.      Hanley Hays [name], M.D.  Electronically Signed     FED/MEDQ  D:  04/14/2005  T:  04/15/2005  Job:  [id number]

## 2010-11-06 NOTE — Discharge Summary (Signed)
NAME:  [name] [name], [name] [name]                       ACCOUNT NO.:  [id number]   MEDICAL RECORD NO.:  [id number]                   PATIENT TYPE:  INP   LOCATION:  A217                                 FACILITY:  [facility]   PHYSICIAN:  Tesfaye D. Felecia Shelling, M.D.              DATE OF BIRTH:  [birth year]   DATE OF ADMISSION:  07/26/2003  DATE OF DISCHARGE:  08/01/2003                                 DISCHARGE SUMMARY   DISCHARGE DIAGNOSES:  1. Pulmonary embolism.  2. Pneumonia.  3. Status post cerebrovascular accident with right-sided hemiplegia.  4. Hypertension.  5. History of dysphagia.  6. History of respiratory failure.   DISCHARGE MEDICATIONS:  1. Atrovent and Proventil nebulizer q.4h. p.r.n.  2. Pepcid 20 mg p.o. daily.  3. Labetalol 300 mg p.o. t.i.d.  4. Norvasc 5 mg p.o. daily.  5. Lisinopril 10 mg p.o. daily.  6. Colace 100 mg p.o. daily.  7. Senokot 1 tablet p.o. daily.  8. MiraLax 17 gm p.o. daily.  9. Prednisolone eye drop 1 drop q.i.d. to the left eye.  10.      Heliotropin 5% eye drop 1 drop q.i.d. to the left eye.  11.      Zymar eye drop q.i.d.  12.      Coumadin 5 mg p.o. daily.  13.      Sulfizin 500 mg p.o. b.i.d. for 5 days.   DISPOSITION:  The patient will be discharged to the nursing home.   HOSPITAL COURSE:  This is a 65 year old male patient with a history of CVA  with a right-sided hemiplegia.  He was brought to the emergency room due to  shortness of breath and fever.  The patient was evaluated in the emergency  room and was found to have multiple pulmonary embolism and pneumonia.  He  was started on IV antibiotics and nebulizer treatment.  The patient also  received anticoagulation.  Currently he is fully anticoagulated and on  discharge his INR is 2.6.  The patient will be discharged back to the  nursing home and continue oral antibiotics and anticoagulation.     ___________________________________________                                         Eustaquio Maize  Felecia Shelling, M.D.   TDF/MEDQ  D:  08/01/2003  T:  08/01/2003  Job:  [id number]

## 2010-11-06 NOTE — Consult Note (Signed)
NAME:  [name] [name], [name] [name]                       ACCOUNT NO.:  [id number]   MEDICAL RECORD NO.:  [id number]                   PATIENT TYPE:  INP   LOCATION:  IC06                                 FACILITY:  [facility]   PHYSICIAN:  Edward L. Juanetta Gosling, M.D.             DATE OF BIRTH:  [birth year]   DATE OF CONSULTATION:  07/29/2003  DATE OF DISCHARGE:                                   CONSULTATION   REFERRING PHYSICIAN:  Tesfaye D. Felecia Shelling, M.D.   REASON FOR CONSULTATION:  Pulmonary embolism.   HISTORY OF PRESENT ILLNESS:  Mr. Bodmer is a 65 year old who lives at the  [facility].  He had a previous stroke and has had difficulties  for some years.  He has been essentially bed bound for sometime.  He had  started having a fever.  He had spit up a small amount of blood and was sent  to the emergency room.  When he was seen in the emergency room, he had a  temperature of 100.3.  His white count was 15,700, hemoglobin 15.7,  electrolytes essentially normal.  D-dimer was elevated and this prompted a  CT scan of the chest which was performed that showed evidence of multiple  lower lobe bilateral pulmonary emboli.  He did not have DVT in the  extremities.  He was started on heparin and sent to the intensive care unit.  His family says that according to his physician, Dr. Felecia Shelling, he seems to be  doing a little better and plans are made to transfer him from the ICU today.   SOCIAL HISTORY:  He is a resident of the [facility].  Nonsmoker.  He does not drink any alcohol.   FAMILY HISTORY:  Not clear, but apparently includes some problem with COPD.   PAST MEDICAL HISTORY:  1. 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  2. Hypertension.  3. Previous history of pneumonia.  4. Previous history of respiratory failure.  5. PEG tube and tracheostomy in the past.   CURRENT MEDICATIONS:  1. Norvasc 5 mg daily.  2. Lisinopril 5 mg daily.  3. Zantac 150 mg b.i.d.  4. Labetalol 300 mg t.i.d.  5. Senokot one daily.  6. Colace 100 mg daily.  7. Multivitamin daily.  8. Eye drops in left eye.   REVIEW OF SYMPTOMS:  Except as mentioned is essentially negative.   PHYSICAL EXAMINATION:  GENERAL:  Well-developed, well-nourished male who is  in no acute distress now.  VITAL SIGNS:  Blood pressure 144/90, pulse 79 in sinus rhythm.  NECK:  Supple without masses.  CHEST:  Fairly clear now.  HEART:  Regular without gallop.  HEENT:  He has a patch over his left eye.  ABDOMEN:  Soft.  He does have a PEG tube.  NEUROLOGIC:  Changes from his previous stroke and somewhat slurred speech.   ASSESSMENT:  He has a pneumonia, plus bilateral pulmonary emboli with  history of cerebrovascular accident.   RECOMMENDATIONS:  1. Go ahead with full anticoagulation.  He will need anticoagulation for a     period of at least six months.  Considering his situation with difficulty     with ambulation, he may need life long anticoagulation.  2. I would plan to start Coumadin if this has not already been done.  3. Continue with heparin.  4. Continue with antibiotics.      ___________________________________________                                            Oneal Deputy Juanetta Gosling, M.D.   Gwenlyn Found  D:  07/29/2003  T:  07/29/2003  Job:  [id number]

## 2010-11-06 NOTE — Group Therapy Note (Signed)
[name] [name], [name] [name]             ACCOUNT NO.:  [id number]   MEDICAL RECORD NO.:  [id number]          PATIENT TYPE:  ORB   LOCATION:  S112                          FACILITY:  [facility]   PHYSICIAN:  Hanley Hays. Dechurch, M.D.DATE OF BIRTH:  01/23/1946   DATE OF PROCEDURE:  DATE OF DISCHARGE:                                   PROGRESS NOTE   COMPREHENSIVE NOTE:  A 65 year old African-American gentleman with multiple  co-morbidities who is being changed to my service for his ongoing care.   PAST MEDICAL HISTORY:  Remarkable for history of pulmonary embolism and  bilateral lower extremities DVT, status post CVA with left hemiplegia,  history of hypertension, acute and chronic renal failure diagnosed during  last hospital stay, recent urinary tract infection, significant hypoxemia,  and left hypoxemic respiratory failure thought secondary to a left lower  lobe pneumonia.  He apparently required mechanical ventilation during this  episode with a pontine hemorrhage in 2003 and has basically been in the long  term care setting since.   FAMILY MEDICAL HISTORY:  Pertinent for stroke.   SOCIAL HISTORY:  He has been a nursing home resident since his stroke.  Previous history of tobacco abuse but none recently.   MEDICATION REGIMEN:  1.  Senokot daily.  2.  Colace daily.  3.  Norvasc 20 mg daily.  4.  Zestril 10 mg daily.  5.  Zymar eye drops to the left eye daily.  6.  MiraLax daily.  7.  Normodyne 300 mg t.i.d.  8.  Lacrilube four times daily.  9.  Pepcid 20 mg daily.  10. Nebulizer therapy while awake.  11. Antivert p.r.n.  12. Hydrocodone.  13. Tylenol.  14. Coumadin.   REVIEW OF SYSTEMS:  The patient is usually alert, able to make his needs  known.  He participates in activities.  He goes to the dining room.  He is  wheelchair independent.  Apparently, he has had problems with constipation.  He has gained a fair amount of weight since being in the facility.  He has  had no  behavior issues per se.   ALLERGIES:  PENICILLIN reaction unknown.   PHYSICAL EXAMINATION:  Alert, dysarthricbut intelligible speech.  130/78  pulse 76 resp unlabored.  Lungs are diminished with a few scattered rhonchi.  Heart regular abdomen protuberant and obese, soft and nontender. Extremities  without edema.  Left hemiparesis.  Oriented to self, time and circumstance.  Skin without lesion or breakdown   ASSESSMENT/PLAN:  16.  A 65 year old with a remote history of cerebrovascular accident with      residual left hemiparesis who continues needing nursing home assistance,      who has actually remained clinically stable.  He is on chronic Coumadin      therapy whose INR today is 2.1 which will be followed up in one week and      then as needed.  2.  Acute and chronic renal insufficiency during recent hospital stay.  Will      follow with a CMP.  3.  History of pulmonary embolus and bilateral deep venous thromboses.  He      again remains on chronic anticoagulation therapy.  I am not sure if      there is a history of atrial fibrillation or not.  I cannot find it      documented in the record.  4.  Chronic obstructive pulmonary disease with recent hypoxemic respiratory      failure and left lower lobe pneumonia.  He has completed a course of      antibiotics and has returned to his baseline state.  I am going to      change his nebulizers to b.i.d. and q.4h. p.r.n.  5.  Anemia.  I am not sure this has been evaluated in the past.  Will follow      up as an outpatient and further evaluation is indicated.  His anemia is      mild with his most recent hemoglobin being 11.9.  6.  Hypertension.  Blood pressure in this facility are in the 90s to 110s.      I am going to change his Normodyne to b.i.d. and monitor.   The patient has an Scientist, water quality.  Full resuscitative efforts which  will be honored during his stay.  Should there be a change in condition,  this of course, will be  revisited.      Hanley Hays [name], M.D.  Electronically Signed     FED/MEDQ  D:  02/02/2005  T:  02/02/2005  Job:  [id number]

## 2010-11-06 NOTE — Group Therapy Note (Signed)
NAME:  [name] [name], [name] [name]                       ACCOUNT NO.:  [id number]   MEDICAL RECORD NO.:  [id number]                   PATIENT TYPE:  INP   LOCATION:  A217                                 FACILITY:  [facility]   PHYSICIAN:  Edward L. Juanetta Gosling, M.D.             DATE OF BIRTH:  [birth year]   DATE OF PROCEDURE:  DATE OF DISCHARGE:                                   PROGRESS NOTE   PROBLEMS:  Pulmonary embolism.   SUBJECTIVE:  [name] [name] says he is feeling fairly well.  He has no new  complaints.  He is not short of breath right now.   OBJECTIVE:  His hemiparesis is unchanged.  Temperature 99.5, pulse 85,  respirations 24, blood pressure 146/91, O2 saturation 96%.  Chest is clear.  Heart is regular.  Abdomen is soft.   ASSESSMENT:  He seems to have improved greatly.  He is certainly oxygenating  better.  I believe that he is going to need lifetime Coumadin therapy.  At  this point, I will plan to go ahead and sign off as he seems to be doing  better, and plans are well underway.  But, I will be glad to see him, of  course, at any time at your request.      ___________________________________________                                            Oneal Deputy. Juanetta Gosling, M.D.   Gwenlyn Found  D:  07/30/2003  T:  07/30/2003  Job:  [id number]

## 2010-11-06 NOTE — Group Therapy Note (Signed)
[name] [name], [name] [name]             ACCOUNT NO.:  [id number]   MEDICAL RECORD NO.:  [id number]          PATIENT TYPE:  ORB   LOCATION:  S112                          FACILITY:  [facility]   PHYSICIAN:  Hanley Hays. Dechurch, M.D.DATE OF BIRTH:  December 25, 1945   DATE OF PROCEDURE:  06/30/2005  DATE OF DISCHARGE:                                   PROGRESS NOTE   HISTORY OF PRESENT ILLNESS:  [name] [name] has been complaining of some  dizziness.  Specifically, he notes he is fine in his room, but when he is in  the hall, he states things jump up and down.  He has trouble focusing.  He  has no true vertigo.  No nausea.  It seems to be worse with turning of his  head.  Cannot reproduce this in the room, but in the bright hall, it seems  to be an issue.  He, otherwise, feels fine.  There has been no mental status  changes.  He has chronic left hemiparesis and severe dysarthria, but this  has not changed.  He has no new findings neurologically.   PHYSICAL EXAMINATION:  GENERAL APPEARANCE:  He is alert and pleasant.  VITAL SIGNS:  Blood pressures are in the 110-120's.  There is no true  orthostasis noted on formal orthostatic pressures.  HEENT:  Extraocular muscles on his right eye, he appears to have trouble  with lateral abduction, but otherwise, intact.  Pupils are about 1 mm.  The  left eye is fixed with left facial weakness.  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  He  has no nystagmus, and otherwise, unchanged exam.  I cannot do a useful  fundal exam secondary to his pupils being so small.  Unfortunately, we do  not have any other equipment or drops here, and I am not comfortable with  the pupils being constricted.  Otherwise, the patient has remained stable.  LUNGS:  Clear.  ABDOMEN:  Soft.  EXTREMITIES:  No dependent edema in the left lower extremity, but no  significant edema overall, and again, no other changes.   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  Unclear if this is a new finding on his exam or not.  He was seen by  an ophthalmologist or optometrist about a month ago.  We will see if we can  get those records to see if this is more pronounced now or the same.  To be  frank, I did not note it in the past.  We did not have these symptoms.  Given the fact it is exacerbated in late conditions, it may just be a matter  of extraocular muscle issues, and it is difficult to accommodate.  He  continues on Coumadin and he will have a trial of PT/INR due next week.  He  has really had no visual loss per se.  Therefore, I do not think this is an  acute issue.  His blood pressures are well controlled.  He is due  for a  follow up BMP.  I will continue his other medications as we are at this  time.     Hanley Hays [name], M.D.  Electronically Signed    FED/MEDQ  D:  06/30/2005  T:  07/01/2005  Job:  [id number]

## 2010-11-06 NOTE — H&P (Signed)
NAME:  [name] [name], [name] [name]                       ACCOUNT NO.:  [id number]   MEDICAL RECORD NO.:  [id number]                   PATIENT TYPE:  INP   LOCATION:  IC06                                 FACILITY:  [facility]   PHYSICIAN:  Angus G. Renard Matter, M.D.              DATE OF BIRTH:  23-Aug-1945   DATE OF ADMISSION:  07/26/2003  DATE OF DISCHARGE:                                HISTORY & PHYSICAL   A 65 year old African-American male who resides in a nursing facility, [facility].  Apparently, this patient had been running a fever and had spit up a  small quantity of blood, stated that it was a small amount of dark blood  which came out of his mouth.  The patient was evaluated by ED physicians.  Was noted to have a fever of 100.3.  X-rays were obtained, which showed  evidence of cardiomegaly, aortic elongation, and calcification, left basilar  infiltrate.  The patient had lab studies done.  CBC:  WBC 15,700 with  hemoglobin of 15.7 and hematocrit of 47.0.  The patient's chemistries are  sodium 138, potassium 4.1, chloride 107, CO2 23 mEq per liter.  Glucose 172,  BUN 32, creatinine 1.7, calcium 8.8.  Liver enzymes:  SGOT 24, SGPT 27,  alkaline phosphatase 69, bilirubin 1, direct bilirubin 0.1, indirect 0.9.  ABGs:  A pH of 7.407 with a pCO2 of 34.4 and a pO2 of 59.8.  Bicarbonate  21.2.  Patient had D-dimer, which was elevated subsequently.  A CT of the  chest was performed and showed evidence of multiple lower lobe bilateral  pulmonary emboli, atelectasis or infiltrate in the base.  No DVT in the  extremities.  The patient was given in the ED IV Rocephin 1 gm and IV  heparin 5000 units as a bolus and then started on 5000 units per hour.  The  patient was subsequently admitted to ICU with diagnosis of pulmonary  embolization, bronchial pneumonia, history of CVA.   SOCIAL HISTORY:  The patient is a resident of [facility].   FAMILY HISTORY:  See previous record.   PAST  MEDICAL/SURGICAL HISTORY:  Patient has a history of CVA, a pontine  hemorrhage, hypertension, history of pneumonia.  History of respiratory  failure.  Surgical procedures include tracheostomy on May 01, 2002, PEG  tube placement, May 31, 2002.   MEDICATIONS:  1. Norvasc 5 mg daily.  2. Lisinopril 5 mg daily.  3. Zantac 150 mg b.i.d.  4. Labetalol 300 mg t.i.d.  5. Senokot daily.  6. Colace 100 mg daily.  7. Multivitamin daily.  8. Norvasc 5 mg daily.  9. Eye drops to the left eye.   REVIEW OF SYSTEMS:  LUNGS:  Patient either coughed or spit up a small  quantity of blood.  GI:  No diarrhea or vomiting.  GU:  No dysuria or  hematuria.   PHYSICAL EXAMINATION:  VITAL SIGNS:  Blood pressure  127/103, respirations  20, pulse 103.  GENERAL:  An acute and chronically ill male.  HEENT:  Left eye opaque.  NECK:  Supple.  No JVD or thyromegaly.  LUNGS:  Rales over lung fields.  HEART:  Regular rhythm.  No murmurs.  No cardiomegaly.  ABDOMEN:  Patient has a gastrostomy tube in the left upper quadrant.  NEUROLOGIC:  Patient has weakness on the entire right side with slurred  speech.  Facial weakness.  SKIN:  Warm and dry.   DIAGNOSIS:  1. Bronchopneumonia.  2. Multiple bilateral pulmonary emboli.  3. History of cerebrovascular accident.     ___________________________________________                                         Ishmael Holter. Renard Matter, M.D.   AGM/MEDQ  D:  07/26/2003  T:  07/26/2003  Job:  [id number]

## 2010-11-06 NOTE — Op Note (Signed)
NAME:  [name] [name], [name] [name]                       ACCOUNT NO.:  [id number]   MEDICAL RECORD NO.:  [id number]                   PATIENT TYPE:  AMB   LOCATION:  DAY                                  FACILITY:  [facility]   PHYSICIAN:  Lionel December, M.D.                 DATE OF BIRTH:  April 06, 1946   DATE OF PROCEDURE:  11/04/2003  DATE OF DISCHARGE:                                 OPERATIVE REPORT   PROCEDURE PERFORMED:  Endoscopic removal of percutaneous endoscopic  gastrostomy.   INDICATIONS FOR PROCEDURE:  [name] is a 65 year old African-American male  with history of hemorrhagic CVA who had PEG placement December 2003.  His  swallowing function is recovered.  The tube is not being used.  He is having  local infection intermittently.  Therefore Dr. Felecia Shelling requested that it be  removed.  I saw the patient last week in the nursing facility.  He has a  traction pull type tube but it could not be removed.  He is therefore having  it removed endoscopically.  The procedure was reviewed with his son [name]  and brother Mr. Thorner.  Informed consent was obtained from them as he is  felt to be competent.   PREOP MEDICATIONS:  Cetacaine spray for pharyngeal topical anesthesia.  Versed 2 mg IV.   DESCRIPTION OF PROCEDURE:  Procedure performed in endoscopy suite.  The  patient's vital signs and oxygen saturations were monitored during the  procedure and remained stable.  The patient was placed in left lateral  condition and the Olympus video scope was passed per oropharynx without  difficulty into the esophagus.   FINDINGS:  Mucosa of the esophagus normal.  There was a small sliding hiatal  hernia.  Fairly large stomach.  G-tube was in place.  Pyloric channel was  patent.  Angularis, fundus and cardia examined by retroflexion of the scope  and were normal.  Examination of bulbar and postbulbar duodenal mucosa was  also normal.  G-tube was loosened by pushing it inside.  Snare was placed  around it and  tightened.  The G-tube was cut with scissors on outside.  Scope was then withdrawn along with the remnant of this G-tube.  The patient  tolerated the procedure well.   The G-site was covered with sterile dressing.   FINAL DIAGNOSES:  1. Gastrostomy tube removed endoscopically.  2. Small sliding hiatal hernia.   RECOMMENDATIONS:  Usual meds and diet will be resumed this morning.  Will  resume his Lovenox and continue for six doses.      ___________________________________________                                            Lionel December, M.D.   NR/MEDQ  D:  11/04/2003  T:  11/04/2003  Job:  [id number]  cc:   Tesfaye D. Felecia Shelling, M.D.  [address]  [place]  [place] [address]  Fax: [phone]

## 2010-11-06 NOTE — Group Therapy Note (Signed)
NAMEBUCKY, GRIGG             ACCOUNT NO.:  [id number]   MEDICAL RECORD NO.:  [id number]          PATIENT TYPE:  INP   LOCATION:  A209                          FACILITY:  [facility]   PHYSICIAN:  Edward L. Juanetta Gosling, M.D.DATE OF BIRTH:  January 06, 1946   DATE OF PROCEDURE:  01/11/2005  DATE OF DISCHARGE:                                   PROGRESS NOTE   SUBJECTIVE:  Mr. Isidore seems to be generally slowly improving.  His chest x-  ray done on July 22, was better.  His O2 saturation today is excellent. He  has no new complaints.   ASSESSMENT:  He does seem to be improving gradually.   PLAN:  Continue his medications and follow.       ELH/MEDQ  D:  01/11/2005  T:  01/11/2005  Job:  [id number]

## 2010-11-06 NOTE — Group Therapy Note (Signed)
NAMEISSIAC, JAMAR             ACCOUNT NO.:  [id number]   MEDICAL RECORD NO.:  [id number]          PATIENT TYPE:  ORB   LOCATION:  S112                          FACILITY:  [facility]   PHYSICIAN:  Hanley Hays. Dechurch, M.D.DATE OF BIRTH:  [birth year]   DATE OF PROCEDURE:  10/23/2005  DATE OF DISCHARGE:                                   PROGRESS NOTE   Mr. Justiss asked to be seen.  He is complaining of constipation.  He states  he has not had a bowel movement in a week.  He continues to eat.  He denies  any pain or bloating.  He also complains of his right eye jumping.  He  states it is going all the time and complains of it as this moment.  He has  limited vision in his left eye secondary to cataracts and his CVA.  He  otherwise states he is feeling fine.   He is alert, appropriate, able to provide adequate history.  He has a left  hemiparesis, which is unchanged.  His lungs are clear.  His heart is  regular.  His abdomen is soft, flat and nontender.  He has some trace edema  of his left lower extremity, otherwise unremarkable.  The right eye extraocular muscles are intact.  There is no blepharospasm.  The pupils are reactive.  On close evaluation, vision appears intact.   ASSESSMENT AND PLAN:  1.  Constipation.  Will add lactulose to his regimen.  2.  Right eye complaints may be related to the fact that the patient has      monocular vision.  He has been evaluated by ophthalmology and certainly      is not having blepharospasm, which is my initial concern.  I reassured      the patient.  He may benefit from patching the left eye.  I told him it      is unlikely to have any effect on his current vision, and he was      reassured.  3.  History of cerebrovascular accident, now on Lipitor.  Liver and lipid      profile are pending for sometime in June, I believe.  4.  Chronic anticoagulation therapy.  Stable dose.  He has a follow-up INR      due in about four weeks.  5.  Lung lesion.   Unchanged.  Will be monitored by CT.      Hanley Hays [name], M.D.  Electronically Signed     FED/MEDQ  D:  10/23/2005  T:  10/25/2005  Job:  [id number]

## 2010-11-06 NOTE — Consult Note (Signed)
[name] [name], [name] [name]             ACCOUNT NO.:  [id number]   MEDICAL RECORD NO.:  [id number]          PATIENT TYPE:  INP   LOCATION:  A306                          FACILITY:  [facility]   PHYSICIAN:  Jorja Loa, M.D.DATE OF BIRTH:  [birth year]   DATE OF CONSULTATION:  01/04/2005  DATE OF DISCHARGE:                                   CONSULTATION   ATTENDING PHYSICIAN:  Dr. Felecia Shelling.   REASON FOR CONSULTATION:  Renal insufficiency.   [name] [name] is a 65 year old African-American with past medical history of  hypertension, CVA and history of PE from a nursing home.  Presently, was  brought to the hospital because of increased fever and hypertension and was  found to have urosepsis and was admitted to the hospital.  During his  admission, his BUN and creatinine were found to be elevated.  Hence, a  consult is called.  From his note, there is no previous history of renal  insufficiency and no history of, also, kidney stone.  At this moment, even  though the patient is able to talk, he denies any previous __________ renal  insufficiency.   PAST MEDICAL HISTORY:  1.  He has a history of CVA with some dysphagia and bedridden.  2.  History of hypertension.  3.  History of previous respiratory failure, status post trach.  4.  History of also PE and PEG placement.   PAST SURGICAL HISTORY:  As stated above, he has a history of a PEG placement  and history of thoracostomy.   SOCIAL HISTORY:  He is an inhabitant of a nursing home.   MEDICATIONS:  1.  Rocephin 1 gm IV q.24h.  2.  Pepcid 20 mg p.o. daily.  3.  Senna 1 tablet p.o. daily.  4.  He is getting IV fluid at 100 mL per hour.  5.  Warfarin 4 mg p.o. daily.   ALLERGIES:  He is allergic to PENICILLIN.   REVIEW OF SYSTEMS:  At this moment, no complaint.  The patient denies any  shortness of breath, dizziness or lightheadedness.  He denies also any  urgency or frequency.   PHYSICAL EXAMINATION:  VITAL SIGNS:  Temperature 98.8, pulse  88, blood  pressure 112/72, respiratory rate 20.  HEENT:  The patient has lost his vision on the left side.  Dry oral mucosa.  Alert.  CHEST:  Decreased breath sounds, otherwise seems to be clear with no rales,  no rhonchi.  No egophony.  HEART:  Reveals a regular rate and rhythm.  No murmur.  ABDOMEN:  Soft.  Positive bowel sounds.  EXTREMITIES:  No edema.   His input is 600 mL.  Output is 800 mL with -200 mL.  This is of fluid  balance.  His blood work:  His white blood cell count is 26.1, hemoglobin  12.4, hematocrit 36, platelets 361.  His PT and INR are 30.3 and 2.9.  Sodium 134, potassium 4.6, BUN 26, creatinine 2.5.  When he came, his BUN  was 32, and his creatinine was at 3.1.  Calcium 7.9.  UA:  Specific gravity  is 1.003.  He has  trace ketones, moderate blood, 11-20 white blood cells, 11-  20 red blood cells and some bacteria.  A blood culture at this moment is  pending.   ASSESSMENT:  1.  Renal insufficiency.  Seems to be at this moment acute as the patient      has no previous history of renal insufficiency.  The etiology for his      renal failure at this moment seems to be prerenal syndrome versus HTN;      however, because of his high urine specific gravity, most likely it is      prerenal syndrome.  At this moment, his BUN and creatinine seem to be      improving.  2.  Urosepsis.  He is on antibiotics, and his white blood cell count is      improving.  Blood pressure is also better.  He is afebrile.  3.  History of cerebrovascular accident.  The patient is bedbound.  4.  History of hypertension.  BP seems to be stable.  5.  History of pulmonary embolism.  He is on Coumadin with a PT/INR recent      drop.   RECOMMENDATIONS:  I agree with the present management.  We will continue  monitoring his input/output and blood work.  If his renal function does not  improve, probably we will do ultrasound of the kidney.  At this moment, we  will hold further workup.       Jorja Loa, M.D.  Electronically Signed     BB/MEDQ  D:  01/04/2005  T:  01/04/2005  Job:  [id number]

## 2010-11-06 NOTE — Group Therapy Note (Signed)
NAMEBULMARO, FEAGANS             ACCOUNT NO.:  [id number]   MEDICAL RECORD NO.:  [id number]          PATIENT TYPE:  ORB   LOCATION:  S112                          FACILITY:  [facility]   PHYSICIAN:  Hanley Hays. Dechurch, M.D.DATE OF BIRTH:  1945-11-15   DATE OF PROCEDURE:  DATE OF DISCHARGE:                                   PROGRESS NOTE   SUBJECTIVE:  [name] overall is doing well. 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 He overall states he is doing  well. He has no new complaints. He denies any shortness of breath. He has  had occasional sputum production but nothing that has increased. He is  sleeping well and eating well. His stress level is markedly decreased since  his roommate situation has changed. He had a CT to followup question of some  abnormal findings on his chest x-ray but there was no evidence of frank  neoplasm. He did have some pulmonary nodules which are due for followup in 6  months. He carries a diagnosis of diabetes mellitus but I do not have a  recent hemoglobin A1c. He is not on any medications for that. Blood  pressures are well controlled. He has had no hypotension. His medications  include Norvasc 5 daily, Zestril 10 daily, Pepcid 20 daily, Coumadin daily,  Labetalol 300 b.i.d. Albuterol and Atrovent nebs b.i.d., Lacrilube to his  left eye q.i.d., O2 at 2 liters.   PHYSICAL EXAMINATION:  GENERAL:  He is well-developed, well-nourished,  alert, left hemiparesis and facial weakness as in the past, no changes. He  is able to follow commands, verbal he is dysarthric but fluent. LUNGS:  Reveal a few scattered rhonchi but equal breath sounds bilaterally. HEART:  Regular. ABDOMEN:  Soft. EXTREMITIES:  Without clubbing or cyanosis. He has  no edema. NEUROLOGIC:  He has the left hemiparesis as  noted above as well as  left facial weakness.   ASSESSMENT/PLAN:  1.  Diabetes mellitus by history. Will follow with hemoglobin A1c. I see the      patient is not on a statin which may be in his best interest. He denies      any previous history of intolerance. Will check a lipid profile and      liver profile for baseline and proceed from there.  2.  Chronic anticoagulation therapy secondary to multiple CVAs. His INR has      been therapeutic and for the most part stable.  3.  History of cataract status post extraction about 1 month ago. Follows      with Dr. Lita Mains. He seems to have had a good result.  4.  Pulmonary nodules. Followup CT will be due in September 2007.      Hanley Hays [name], M.D.  Electronically Signed     FED/MEDQ  D:  10/05/2005  T:  10/06/2005  Job:  [id number]

## 2010-11-06 NOTE — Discharge Summary (Signed)
[name] [name], [name] [name]             ACCOUNT NO.:  [id number]   MEDICAL RECORD NO.:  [id number]          PATIENT TYPE:  INP   LOCATION:  A209                          FACILITY:  [facility]   PHYSICIAN:  Tesfaye D. Felecia Shelling, MD   DATE OF BIRTH:  May 19, 1946   DATE OF ADMISSION:  01/03/2005  DATE OF DISCHARGE:  07/24/2006LH                                 DISCHARGE SUMMARY   DISCHARGE DIAGNOSES:  1.  Left lower lobe pneumonia.  2.  Hypoxemia.  3.  Urinary tract infection.  4.  Hypotension secondary to multiple medical causes.  5.  Acute renal failure.  6.  History of pulmonary embolism.  7.  History of bilateral lower extremity deep venous thrombosis.  8.  Status post cerebrovascular accident with left-sided hemiplegia.  9.  History of hypertension.   DISCHARGE MEDICATIONS:  1.  Atrovent and Proventil nebulizers q.4h. while awake.  2.  Oxygen 2 L through nasal cannula.  3.  Senokot one tablet p.o. daily.  4.  Colace 100 mg p.o. daily.  5.  Prevacid 20 mg p.o. daily.  6.  Rocephin 1 g IV piggyback x5 more days.  7.  Zymar 0.3% eye drop one drop in the left eye.  8.  Lacri-Lube SOP eye ointment apply to the left eye four times a day.  9.  Norvasc 5 mg p.o. q.d.  10. Labetalol 300 mg t.i.d.  11. Lisinopril 10 mg p.o. q.d.  12. Tylenol 500 mg p.o. q.6h. p.r.n.   DISPOSITION:  Discharged back to [facility].   HOSPITAL COURSE:  This is a 65 year old male patient who was admitted due to  change in mental status and weakness.  The patient was found to have  hypotension, abnormal urinalysis and acute renal failure on admission.  The  patient was admitted with urosepsis with hypotension and acute renal  failure.  He was started on IV antibiotics and IV fluid.  Nephrology consult  was done and his renal function was treated.  While patient was in the  hospital, he started having respiratory symptoms including congestion, cough  and shortness of breath.  His pO2 was very low.  Chest x-ray showed  left  lower lobe infiltrate with a pleural effusion.  VQ scan was suspicious for  pulmonary embolism.  However, the patient was already fully anticoagulated.  He was treated with oxygen therapy and nebulizer treatment.  Over the  hospital stay, the patient gradually improved.  His oxygenation improved to  94 on 4  L.  His oxygen saturation was above 95%.  The patient is back to his  baseline.  Since he was supratherapeutic on his INR, his Coumadin will be on  hold.  Will repeat PT/INR tomorrow and will decide the dose of his Coumadin.  Will continue the patient on IV antibiotics x5 more days.       TDF/MEDQ  D:  01/11/2005  T:  01/11/2005  Job:  [id number]

## 2011-01-11 ENCOUNTER — Ambulatory Visit (HOSPITAL_COMMUNITY): Payer: Medicaid Other | Attending: Internal Medicine

## 2011-01-11 DIAGNOSIS — R059 Cough, unspecified: Secondary | ICD-10-CM | POA: Insufficient documentation

## 2011-01-11 DIAGNOSIS — R0602 Shortness of breath: Secondary | ICD-10-CM | POA: Insufficient documentation

## 2011-01-11 DIAGNOSIS — R05 Cough: Secondary | ICD-10-CM | POA: Insufficient documentation

## 2011-03-18 LAB — PROTIME-INR
INR: 2.5 — ABNORMAL HIGH
Prothrombin Time: 28.3 — ABNORMAL HIGH

## 2011-03-18 LAB — CBC
HCT: 43
MCHC: 33.6
MCV: 92
Platelets: 395

## 2011-03-18 LAB — DIFFERENTIAL
Basophils Absolute: 0.1
Eosinophils Relative: 3
Lymphocytes Relative: 9 — ABNORMAL LOW
Lymphs Abs: 1.2
Neutro Abs: 10.3 — ABNORMAL HIGH

## 2011-03-18 LAB — COMPREHENSIVE METABOLIC PANEL
AST: 34
Albumin: 2.9 — ABNORMAL LOW
BUN: 18
CO2: 27
Calcium: 9
Creatinine, Ser: 1.46
GFR calc Af Amer: 59 — ABNORMAL LOW
GFR calc non Af Amer: 49 — ABNORMAL LOW

## 2011-03-18 LAB — LIPASE, BLOOD: Lipase: 20

## 2011-06-19 ENCOUNTER — Ambulatory Visit (HOSPITAL_COMMUNITY): Payer: Medicare Other | Attending: Internal Medicine

## 2011-06-19 DIAGNOSIS — R059 Cough, unspecified: Secondary | ICD-10-CM | POA: Insufficient documentation

## 2011-06-19 DIAGNOSIS — R05 Cough: Secondary | ICD-10-CM | POA: Insufficient documentation

## 2011-06-19 DIAGNOSIS — R0602 Shortness of breath: Secondary | ICD-10-CM | POA: Insufficient documentation

## 2012-01-20 ENCOUNTER — Ambulatory Visit (HOSPITAL_COMMUNITY): Payer: Medicare Other | Attending: Internal Medicine

## 2012-01-20 DIAGNOSIS — R059 Cough, unspecified: Secondary | ICD-10-CM | POA: Insufficient documentation

## 2012-01-20 DIAGNOSIS — R05 Cough: Secondary | ICD-10-CM | POA: Insufficient documentation

## 2012-02-03 ENCOUNTER — Emergency Department (HOSPITAL_COMMUNITY)
Admission: EM | Admit: 2012-02-03 | Discharge: 2012-02-03 | Disposition: A | Discharge status: Home / Self Care | Payer: Medicare Other | Attending: Emergency Medicine | Admitting: Emergency Medicine

## 2012-02-03 ENCOUNTER — Inpatient Hospital Stay
Admission: RE | Admit: 2012-02-03 | Discharge: 2012-09-05 | Disposition: A | Discharge status: Nursing Home (Includes ICF) | Payer: Medicaid Other | Source: Ambulatory Visit | Attending: Internal Medicine | Admitting: Internal Medicine

## 2012-02-03 ENCOUNTER — Encounter (HOSPITAL_COMMUNITY): Payer: Self-pay | Admitting: *Deleted

## 2012-02-03 DIAGNOSIS — Z7901 Long term (current) use of anticoagulants: Secondary | ICD-10-CM | POA: Insufficient documentation

## 2012-02-03 DIAGNOSIS — S01502A Unspecified open wound of oral cavity, initial encounter: Secondary | ICD-10-CM | POA: Insufficient documentation

## 2012-02-03 DIAGNOSIS — Z86718 Personal history of other venous thrombosis and embolism: Secondary | ICD-10-CM | POA: Insufficient documentation

## 2012-02-03 DIAGNOSIS — I10 Essential (primary) hypertension: Secondary | ICD-10-CM | POA: Insufficient documentation

## 2012-02-03 DIAGNOSIS — R609 Edema, unspecified: Principal | ICD-10-CM

## 2012-02-03 DIAGNOSIS — L819 Disorder of pigmentation, unspecified: Secondary | ICD-10-CM

## 2012-02-03 DIAGNOSIS — S01512A Laceration without foreign body of oral cavity, initial encounter: Secondary | ICD-10-CM

## 2012-02-03 DIAGNOSIS — Z8673 Personal history of transient ischemic attack (TIA), and cerebral infarction without residual deficits: Secondary | ICD-10-CM | POA: Insufficient documentation

## 2012-02-03 DIAGNOSIS — W268XXA Contact with other sharp object(s), not elsewhere classified, initial encounter: Secondary | ICD-10-CM | POA: Insufficient documentation

## 2012-02-03 DIAGNOSIS — Z79899 Other long term (current) drug therapy: Secondary | ICD-10-CM | POA: Insufficient documentation

## 2012-02-03 HISTORY — DX: Essential (primary) hypertension: I10

## 2012-02-03 HISTORY — DX: Dysphasia: R47.02

## 2012-02-03 HISTORY — DX: Pneumonia, unspecified organism: J18.9

## 2012-02-03 HISTORY — DX: Cerebral infarction, unspecified: I63.9

## 2012-02-03 HISTORY — DX: Fatty (change of) liver, not elsewhere classified: K76.0

## 2012-02-03 HISTORY — DX: Other pulmonary embolism without acute cor pulmonale: I26.99

## 2012-02-03 LAB — CBC WITH DIFFERENTIAL/PLATELET
Basophils Absolute: 0 10*3/uL (ref 0.0–0.1)
Lymphocytes Relative: 22 % (ref 12–46)
Lymphs Abs: 1.9 10*3/uL (ref 0.7–4.0)
MCV: 92.2 fL (ref 78.0–100.0)
Neutro Abs: 5.7 10*3/uL (ref 1.7–7.7)
Neutrophils Relative %: 68 % (ref 43–77)
Platelets: 265 10*3/uL (ref 150–400)
RBC: 5.03 MIL/uL (ref 4.22–5.81)
RDW: 13.4 % (ref 11.5–15.5)
WBC: 8.5 10*3/uL (ref 4.0–10.5)

## 2012-02-03 LAB — BASIC METABOLIC PANEL
CO2: 27 mEq/L (ref 19–32)
Calcium: 9.5 mg/dL (ref 8.4–10.5)
Chloride: 103 mEq/L (ref 96–112)
Glucose, Bld: 97 mg/dL (ref 70–99)
Potassium: 4.5 mEq/L (ref 3.5–5.1)
Sodium: 138 mEq/L (ref 135–145)

## 2012-02-03 LAB — PROTIME-INR
INR: 2.06 — ABNORMAL HIGH (ref 0.00–1.49)
Prothrombin Time: 23.6 seconds — ABNORMAL HIGH (ref 11.6–15.2)

## 2012-02-03 LAB — APTT: aPTT: 36 seconds (ref 24–37)

## 2012-02-03 MED ORDER — LIDOCAINE-EPINEPHRINE 2 %-1:100000 IJ SOLN: 1.7000 mL | Freq: Once | INTRAMUSCULAR | Status: DC

## 2012-02-03 MED ORDER — LIDOCAINE-EPINEPHRINE (PF) 2 %-1:200000 IJ SOLN
INTRAMUSCULAR | Status: AC
  Filled 2012-02-03: qty 20

## 2012-02-03 NOTE — ED Notes (Signed)
Staff noticed bleeding to pts mouth around 1030. They have been unable to determine where bleeding is coming from. Staff states pt has new dentures that aren't fitting properly and think this may be the cause. Pt is on coumadin

## 2012-02-03 NOTE — ED Provider Notes (Signed)
History  This chart was scribed for [name] Cooper III, MD by Erskine Emery. This patient was seen in room APA03/APA03 and the patient's care was started at 13:25.   CSN: [id number]  Arrival date & time 02/03/12  1207   First MD Initiated Contact with Patient 02/03/12 1325      Chief Complaint  Patient presents with  . Mouth Injury    (Consider location/radiation/quality/duration/timing/severity/associated sxs/prior Treatment)  Caveat: Pt was unable to talk due to his mouth being full of gauze for the bleeding.  HPI SALAR [name] is a 66 y.o. male who presents to the Emergency Department complaining of bleeding from the mouth since this morning. The staff reports the pt has new dentures that are not fitting properly. Pt has a h/o stroke, pulmonary embolism, HTN, and dysphagia. Pt is on Coumadin.   Past Medical History  Diagnosis Date  . Stroke   . Fatty liver   . Pneumonia   . Dysphasia   . Pulmonary embolism   . Hypertension     Past Surgical History  Procedure Date  . Unable     No family history on file.  History  Substance Use Topics  . Smoking status: Not on file  . Smokeless tobacco: Not on file  . Alcohol Use: No      Review of Systems  Unable to perform ROS: Other  Constitutional: Negative for fever.  HENT: Positive for dental problem.        Bleeding from mouth  Respiratory: Negative for shortness of breath.   Gastrointestinal: Negative for nausea and vomiting.  Skin: Negative for rash.  Neurological: Negative for syncope and weakness.  Psychiatric/Behavioral: The patient is not nervous/anxious.     Allergies  Penicillins  Home Medications  No current outpatient prescriptions on file.  Triage Vitals: BP 142/89  Pulse 80  Temp 98.2 F (36.8 C) (Oral)  Resp 19  Ht 5\' 9"  (1.753 m)  Wt 213 lb (96.616 kg)  BMI 31.45 kg/m2  SpO2 95%  Physical Exam  Nursing note and vitals reviewed. Constitutional: He is oriented to person, place, and  time. He appears well-developed and well-nourished. No distress.  HENT:  Head: Normocephalic and atraumatic.       0.5 cm laceration on mucosa surface above the left lower central molar.  Eyes: EOM are normal. Pupils are equal, round, and reactive to light.  Neck: Neck supple. No tracheal deviation present.  Cardiovascular: Normal rate.   Pulmonary/Chest: Effort normal. No respiratory distress.  Abdominal: Soft. He exhibits no distension.  Musculoskeletal: Normal range of motion. He exhibits no edema.  Neurological: He is alert and oriented to person, place, and time.  Skin: Skin is warm and dry.  Psychiatric: He has a normal mood and affect.    ED Course  Procedures (including critical care time) DIAGNOSTIC STUDIES: Oxygen Saturation is 95% on room air, adequate by my interpretation.    COORDINATION OF CARE: 13:31--I evaluated the patient and we discussed a treatment plan including laceration repair and blood work to which the pt agreed.   13:50--I performed laceration repair. LACERATION REPAIR Performed by: [name] Cooper Consent: Verbal consent obtained. Risks and benefits: risks, benefits and alternatives were discussed Patient identity confirmed: provided demographic data Time out performed prior to procedure Prepped and Draped in normal sterile fashion Wound explored Laceration Location: on the mucosa surface above the left lower central molar Laceration Length: 0.5 cm No Foreign Bodies seen or palpated Anesthesia: local infiltration Local anesthetic: lidocaine  2% with epinephrine Anesthetic total: 4 cc Irrigation method: syringe Amount of cleaning: standard Number of sutures or staples: 3 Technique: interrupted Patient tolerance: Patient tolerated the procedure well with no immediate complications.  I notified the pt that these are the kind of stitches that dissolve and the pt does not need to have them removed.   3:23 PM Results for orders placed during the  hospital encounter of 02/03/12  CBC WITH DIFFERENTIAL      Component Value Range   WBC 8.5  4.0 - 10.5 K/uL   RBC 5.03  4.22 - 5.81 MIL/uL   Hemoglobin 15.7  13.0 - 17.0 g/dL   HCT 14.7  82.9 - 56.2 %   MCV 92.2  78.0 - 100.0 fL   MCH 31.2  26.0 - 34.0 pg   MCHC 33.8  30.0 - 36.0 g/dL   RDW 13.0  86.5 - 78.4 %   Platelets 265  150 - 400 K/uL   Neutrophils Relative 68  43 - 77 %   Neutro Abs 5.7  1.7 - 7.7 K/uL   Lymphocytes Relative 22  12 - 46 %   Lymphs Abs 1.9  0.7 - 4.0 K/uL   Monocytes Relative 7  3 - 12 %   Monocytes Absolute 0.6  0.1 - 1.0 K/uL   Eosinophils Relative 3  0 - 5 %   Eosinophils Absolute 0.2  0.0 - 0.7 K/uL   Basophils Relative 0  0 - 1 %   Basophils Absolute 0.0  0.0 - 0.1 K/uL  BASIC METABOLIC PANEL      Component Value Range   Sodium 138  135 - 145 mEq/L   Potassium 4.5  3.5 - 5.1 mEq/L   Chloride 103  96 - 112 mEq/L   CO2 27  19 - 32 mEq/L   Glucose, Bld 97  70 - 99 mg/dL   BUN 17  6 - 23 mg/dL   Creatinine, Ser 6.96  0.50 - 1.35 mg/dL   Calcium 9.5  8.4 - 29.5 mg/dL   GFR calc non Af Amer 64 (*) >90 mL/min   GFR calc Af Amer 74 (*) >90 mL/min  PROTIME-INR      Component Value Range   Prothrombin Time 23.6 (*) 11.6 - 15.2 seconds   INR 2.06 (*) 0.00 - 1.49  APTT      Component Value Range   aPTT 36  24 - 37 seconds   INR is therapeutic at 2.06.  Other labs good. Not anemic.    15:27--I rechecked the pt who is no longer bleeding. I told him to leave the dentures out because they cut him. I let him know that he would be discharged.   1. Intraoral laceration     I personally performed the services described in this documentation, which was scribed in my presence. The recorded information has been reviewed and considered.  Osvaldo Human, MD      [name] Cooper III, MD 02/03/12 [phone]

## 2012-02-03 NOTE — ED Notes (Signed)
PT called wanting assistance, needing something to absorb continuous bleeding from the mouth.  Gauze given, Pts  Nurse informed.

## 2012-08-10 ENCOUNTER — Ambulatory Visit (INDEPENDENT_AMBULATORY_CARE_PROVIDER_SITE_OTHER): Payer: Self-pay | Admitting: Otolaryngology

## 2012-08-11 ENCOUNTER — Ambulatory Visit (HOSPITAL_COMMUNITY): Payer: Medicare Other | Attending: Internal Medicine

## 2012-08-11 DIAGNOSIS — M79609 Pain in unspecified limb: Secondary | ICD-10-CM | POA: Insufficient documentation

## 2012-09-05 ENCOUNTER — Inpatient Hospital Stay
Admission: RE | Admit: 2012-09-05 | Discharge: 2016-10-21 | Disposition: A | Discharge status: Still In Hospital | Payer: Medicaid Other | Source: Ambulatory Visit | Attending: Internal Medicine | Admitting: Internal Medicine

## 2012-09-05 ENCOUNTER — Emergency Department (HOSPITAL_COMMUNITY): Payer: Medicare Other

## 2012-09-05 ENCOUNTER — Emergency Department (HOSPITAL_COMMUNITY)
Admission: EM | Admit: 2012-09-05 | Discharge: 2012-09-05 | Disposition: A | Discharge status: Home / Self Care | Payer: Medicare Other | Attending: Emergency Medicine | Admitting: Emergency Medicine

## 2012-09-05 ENCOUNTER — Encounter (HOSPITAL_COMMUNITY): Payer: Self-pay | Admitting: Emergency Medicine

## 2012-09-05 DIAGNOSIS — R209 Unspecified disturbances of skin sensation: Secondary | ICD-10-CM | POA: Insufficient documentation

## 2012-09-05 DIAGNOSIS — I10 Essential (primary) hypertension: Secondary | ICD-10-CM | POA: Insufficient documentation

## 2012-09-05 DIAGNOSIS — Z8719 Personal history of other diseases of the digestive system: Secondary | ICD-10-CM | POA: Insufficient documentation

## 2012-09-05 DIAGNOSIS — Z86711 Personal history of pulmonary embolism: Secondary | ICD-10-CM | POA: Insufficient documentation

## 2012-09-05 DIAGNOSIS — Z7901 Long term (current) use of anticoagulants: Secondary | ICD-10-CM | POA: Insufficient documentation

## 2012-09-05 DIAGNOSIS — Z8701 Personal history of pneumonia (recurrent): Secondary | ICD-10-CM | POA: Insufficient documentation

## 2012-09-05 DIAGNOSIS — M79601 Pain in right arm: Secondary | ICD-10-CM

## 2012-09-05 DIAGNOSIS — Z8673 Personal history of transient ischemic attack (TIA), and cerebral infarction without residual deficits: Secondary | ICD-10-CM | POA: Insufficient documentation

## 2012-09-05 DIAGNOSIS — Z79899 Other long term (current) drug therapy: Secondary | ICD-10-CM | POA: Insufficient documentation

## 2012-09-05 LAB — DIFFERENTIAL
Basophils Relative: 0 % (ref 0–1)
Lymphocytes Relative: 29 % (ref 12–46)
Lymphs Abs: 2 10*3/uL (ref 0.7–4.0)
Monocytes Relative: 6 % (ref 3–12)
Neutro Abs: 4.2 10*3/uL (ref 1.7–7.7)
Neutrophils Relative %: 60 % (ref 43–77)

## 2012-09-05 LAB — COMPREHENSIVE METABOLIC PANEL
Albumin: 3.2 g/dL — ABNORMAL LOW (ref 3.5–5.2)
Alkaline Phosphatase: 47 U/L (ref 39–117)
BUN: 15 mg/dL (ref 6–23)
CO2: 28 mEq/L (ref 19–32)
Chloride: 101 mEq/L (ref 96–112)
Glucose, Bld: 95 mg/dL (ref 70–99)
Potassium: 4.1 mEq/L (ref 3.5–5.1)
Total Bilirubin: 0.2 mg/dL — ABNORMAL LOW (ref 0.3–1.2)

## 2012-09-05 LAB — CBC
Hemoglobin: 14.7 g/dL (ref 13.0–17.0)
RBC: 4.87 MIL/uL (ref 4.22–5.81)
WBC: 7 10*3/uL (ref 4.0–10.5)

## 2012-09-05 LAB — APTT: aPTT: 46 seconds — ABNORMAL HIGH (ref 24–37)

## 2012-09-05 LAB — PROTIME-INR: INR: 2.42 — ABNORMAL HIGH (ref 0.00–1.49)

## 2012-09-05 LAB — TROPONIN I: Troponin I: <0.3 ng/mL (ref <0.30)

## 2012-09-05 NOTE — ED Provider Notes (Signed)
History     CSN: [id number]  Arrival date & time 09/05/12  0226   First MD Initiated Contact with Patient 09/05/12 0244      Chief Complaint  Patient presents with  . Numbness    (Consider location/radiation/quality/duration/timing/severity/associated sxs/prior treatment) HPI Comments: 67 year old male who presents from the nursing facility with a complaint of right-sided numbness. According to the medical record the patient has had a history of prior thromboembolic events such as pulmonary embolisms as well as a large stroke which caused significant right-sided deficits as well as facial droop. In fact the history and physical exam from 2005 was recorded by Dr. Renard Matter stating that the patient had a large territory CVA causing facial weakness as well as weakness to the right side of his body and slurred speech.  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 He has resided in a nursing facility since that time. I am unable to get a hold of the nurse at the nursing facility who sent him to the hospital but per the paramedics report the patient had woken this evening with numbness of his right upper extremity. The patient states that when he went to bed earlier in the evening he did not have these symptoms, he does not remember the exact time he went to sleep.  Currently the patient takes Coumadin  The history is provided by the patient, the EMS personnel, the nursing home and medical records.    Past Medical History  Diagnosis Date  . Stroke   . Fatty liver   . Pneumonia   . Dysphasia   . Pulmonary embolism   . Hypertension     Past Surgical History  Procedure Laterality Date  . Unable      No family history on file.  History  Substance Use Topics  . Smoking status: Not on file  . Smokeless tobacco: Not on file  . Alcohol Use: No      Review of Systems  All other systems reviewed and are negative.    Allergies  Penicillins  Home  Medications   Current Outpatient Rx  Name  Route  Sig  Dispense  Refill  . Artificial Tear Ointment (LACRI-LUBE S.O.P.) OINT   Left Eye   Place 1 application into the left eye 2 (two) times daily.          . hydroxypropyl methylcellulose (ISOPTO TEARS) 2.5 % ophthalmic solution   Both Eyes   Place 3 drops into both eyes 2 (two) times daily.         Marland Kitchen ipratropium-albuterol (DUONEB) 0.5-2.5 (3) MG/3ML SOLN   Nebulization   Take 3 mLs by nebulization 2 (two) times daily.         Marland Kitchen labetalol (NORMODYNE) 200 MG tablet   Oral   Take 200 mg by mouth daily.         Marland Kitchen lactulose (CHRONULAC) 10 GM/15ML solution   Oral   Take 10 g by mouth 2 (two) times daily.         Marland Kitchen lisinopril (PRINIVIL,ZESTRIL) 10 MG tablet   Oral   Take 10 mg by mouth daily.         Marland Kitchen warfarin (COUMADIN) 1 MG tablet   Oral   Take by mouth every Monday, Wednesday, and Friday. Takes along with a 3 mg tablet to make 3.5 mg.         . warfarin (COUMADIN) 3 MG tablet   Oral  Take 3 mg by mouth daily.         . sennosides-docusate sodium (SENOKOT-S) 8.6-50 MG tablet   Oral   Take 1 tablet by mouth at bedtime.           BP 148/104  Pulse 83  Temp(Src) 98.2 F (36.8 C) (Oral)  Resp 14  Ht 5\' 10"  (1.778 m)  Wt 215 lb (97.523 kg)  BMI 30.85 kg/m2  SpO2 94%  Physical Exam  Nursing note and vitals reviewed. Constitutional: He appears well-developed and well-nourished. No distress.  HENT:  Head: Normocephalic and atraumatic.  Mouth/Throat: Oropharynx is clear and moist. No oropharyngeal exudate.  Left-sided facial droop  Eyes: Conjunctivae and EOM are normal. Pupils are equal, round, and reactive to light. Right eye exhibits no discharge. Left eye exhibits no discharge. No scleral icterus.  Left eye slightly opaque, pupillary exam normal bilaterally  Neck: Normal range of motion. Neck supple. No JVD present. No thyromegaly present.  Cardiovascular: Normal rate, regular rhythm, normal  heart sounds and intact distal pulses.  Exam reveals no gallop and no friction rub.   No murmur heard. Pulmonary/Chest: Effort normal and breath sounds normal. No respiratory distress. He has no wheezes. He has no rales.  Abdominal: Soft. Bowel sounds are normal. He exhibits no distension and no mass. There is no tenderness.  Musculoskeletal: He exhibits no edema and no tenderness.  Lymphadenopathy:    He has no cervical adenopathy.  Neurological: He is alert.  Left arm and leg with normal grip and strength, right upper extremity with some spasticity, right lower extremity with relatively normal strength, normal sensation to the bilateral lower extremities, slight decreased sensation to the right upper tremor he compared to the normal left upper extremity. Facial droop, patient unable to close his left eye, facial droop and some left  Skin: Skin is warm and dry. No rash noted. No erythema.  Psychiatric: He has a normal mood and affect. His behavior is normal.    ED Course  Procedures (including critical care time)  Labs Reviewed  PROTIME-INR - Abnormal; Notable for the following:    Prothrombin Time 25.2 (*)    INR 2.42 (*)    All other components within normal limits  APTT - Abnormal; Notable for the following:    aPTT 46 (*)    All other components within normal limits  COMPREHENSIVE METABOLIC PANEL - Abnormal; Notable for the following:    Albumin 3.2 (*)    Total Bilirubin 0.2 (*)    GFR calc non Af Amer 60 (*)    GFR calc Af Amer 70 (*)    All other components within normal limits  CBC  DIFFERENTIAL  TROPONIN I  POCT I-STAT TROPONIN I   Ct Head Wo Contrast  09/05/2012  *RADIOLOGY REPORT*  Clinical Data: Patient woke up with right-sided numbness and right- sided weakness.  Previous strokes.  CT HEAD WITHOUT CONTRAST  Technique:  Contiguous axial images were obtained from the base of the skull through the vertex without contrast.  Comparison: 01/03/2005  Findings: Diffuse  cerebral atrophy.  Mild ventricular dilatation consistent with central atrophy.  Low attenuation changes in the deep white matter consistent with small vessel ischemia.  Prominent cisterna magna in the posterior fossa.  No mass effect or midline shift.  No abnormal extra-axial fluid collections.  Low attenuation change and atrophy in the cerebellar hemispheres consistent with old cerebellar infarcts.  Gray-white matter junctions are distinct. Basal cisterns are not effaced.  No  evidence of acute intracranial hemorrhage.  Tortuous and ectatic basilar artery.  Calcifications in the internal carotid arteries.  No depressed skull fractures. Opacification of the right maxillary antrum with membrane thickening in the sphenoid sinuses.  No opacification of the mastoid air cells.  IMPRESSION: Chronic atrophy and small vessel ischemic changes.  No acute intracranial abnormalities.  Probable inflammatory changes in the paranasal sinuses.   Original Report Authenticated By: Burman Nieves, M.D.      1. Right arm numbness       MDM  The patient admits that he is not ambulatory at baseline, he does have a significant right-sided stroke in the past which has left him significantly debilitated. At this time I am unsure how many of the symptoms that we're seeing with right-sided weakness and facial droop he has are new versus old findings however prior medical records suggest that his right-sided weakness is an old finding and the facial droop as old as well. The sensory deficit he is experiencing involves his right upper extremity in a stocking glove distribution. He is still able to move the arm around fairly well. He is unsure how much of this deficiency is related to a prior stroke. Vital signs relatively normal other than mild hypertension, the patient is already on Coumadin. Will check labs and a CT scan to rule out recurrent hemorrhage.   ED ECG REPORT  I personally interpreted this EKG   Date: 09/05/2012    Rate: 79  Rhythm: normal sinus rhythm  QRS Axis: left  Intervals: normal  ST/T Wave abnormalities: normal  Conduction Disutrbances:none  Narrative Interpretation:   Old EKG Reviewed: no prior for comparison  The pt is already on coumadin and therapeutic based on tonight's labs.  He has minimal findings on exam c/w prior documented exam.  There is no indication for further inpatient evaluation but seems it can occur as an outpatient.  His CT shows no signs of acute hemorrhage and has no large area acute infarcts.  Labs unremarkable, stable for d/c back to facility.    Vida Roller, MD 09/05/12 [phone]

## 2012-09-05 NOTE — ED Notes (Signed)
Patient is a resident at the [facility]; patient states he woke up with right sided numbness.

## 2012-09-26 ENCOUNTER — Non-Acute Institutional Stay (SKILLED_NURSING_FACILITY): Payer: Medicare Other | Admitting: Internal Medicine

## 2012-09-26 DIAGNOSIS — J690 Pneumonitis due to inhalation of food and vomit: Secondary | ICD-10-CM | POA: Insufficient documentation

## 2012-09-26 DIAGNOSIS — I639 Cerebral infarction, unspecified: Secondary | ICD-10-CM | POA: Insufficient documentation

## 2012-09-26 DIAGNOSIS — K5900 Constipation, unspecified: Secondary | ICD-10-CM | POA: Insufficient documentation

## 2012-09-26 DIAGNOSIS — J189 Pneumonia, unspecified organism: Secondary | ICD-10-CM

## 2012-09-26 DIAGNOSIS — I2782 Chronic pulmonary embolism: Secondary | ICD-10-CM

## 2012-09-26 DIAGNOSIS — I6350 Cerebral infarction due to unspecified occlusion or stenosis of unspecified cerebral artery: Secondary | ICD-10-CM

## 2012-09-26 DIAGNOSIS — I82409 Acute embolism and thrombosis of unspecified deep veins of unspecified lower extremity: Secondary | ICD-10-CM

## 2012-09-26 DIAGNOSIS — I10 Essential (primary) hypertension: Secondary | ICD-10-CM

## 2012-09-26 HISTORY — DX: Acute embolism and thrombosis of unspecified deep veins of unspecified lower extremity: I82.409

## 2012-09-26 NOTE — Progress Notes (Signed)
Patient ID: [name] [name], male   DOB: 1945-09-18, 67 y.o.   MRN: [id number] Chief complaint-medical management of CVA late effect-hypertension-history DVT-history of pulmonary embolism-history pneumonia-.  History of present illness.  Patient is a pleasant elderly resident with the above diagnosis-he continues to be quite stable.  To the ER recently complaining of right arm numbness-workup in the ER was negative for any acute process-there has been no recurrence ---h speaking with his nurse today he laid on his arm during the night there is some suspicion this is what led to the numbness  In regards to other issues these appear to be stable his weight is stable.  Blood pressure appears to be stable recent systolics in the 120s.  He continues on labetalol lisinopril.  He does have a history of DVT and pulmonary embolism he is on chronic Coumadin and this has been stable as well.-Recent INR was 2.11.  He also has a history of pneumonia but this has been stable for some time he is on nebulizers.  He also has a history of borderline hyperlipidemia he also has a history of fatty liver disease so this has been somewhat conservative treatment recent lipid panel showed cholesterol 151 LDL minimally elevated at 846.  Today he has no acute complaints he does have a dermatology appointment scheduled for some well demarcated dry-looking areas on his skin that apparently have popped up on his arms and back.  He was treated for a fungal infection of his foot recently as well.  Family medical social history as been reviewed.  Medications have been reviewed per MAR.  Review of systems.  As stated in history of present illness.  In general no complaints of fever or chills.  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  Does not complaining of any sore throat.  Respiratory-denies any shortness of breath or cough.  Cardiac-no complaints of chest pain.  GI-no  complaints of nausea vomiting diarrhea constipation or abdominal discomfort.  Muscle skeletal-he does have some right-sided weakness status post CVA this is baseline and unchanged.--He does not complain of any joint pain  Neurologic-- does not complain of headache or dizziness-has not complained of numbness since the ER visit   Physical exam.  Temperature is 97.1 pulse 75 respirations 20 blood pressure 123/70-weight is stable at 212.  In general this is a pleasant elderly male in no distress ambulating in his wheelchair.  The skin is warm and dry he does have some well demarcated slightly raised areas that appeared dry on his arms and back there is no surrounding erythema or drainage.  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  Oropharynx is clear mucous membranes moist.  Chest-does have a minimal amount of rhonchi on expiration-no labored breathing this is baseline.  Heart is regular rate and rhythm without murmur gallop or rub he has trace lower extremity edema with obese changes.  Abdomen obese soft nontender with positive bowel sounds.  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  Neurologic-as stated above that she is able to ambulate in his wheelchair and this is his baseline.  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  Labs.  09/18/2012.  Cholesterol 151 triglycerides 71 LDL 105 HDL 32.  09/18/2012.  INR-2.11.  Liver function  tests within normal limits except albumin of 3.0.  05/17/2012.  The CBC 6.2 hemoglobin 15.5 platelets 271.  Sodium 140 potassium 4 BUN 24 creatinine 1.2.  Assessment and plan.  Number -1-- CVA late effect-this appears to be baseline he does continue on Coumadin updated INR is pending this  appears to be relatively stable-suspect recent numbness was caused by patient's body position-there has been no reoccurrence continue to monitor certainly for any recurrence.--Will update CBC secondary to history high-risk meds--update INR has been scheduled for later this month  #2-hypertension-this appears stable on lisinopril as well as labetalol.--Update BMP  #3 history DVT-again continues on anticoagulation at the INR is pending.  #4-history of pneumonia-this is a stable he is on routine nebulizers  #5-constipation-this appears stable current medications.  He is on lactulose.  #6-hyperlipidemia-again conservative here secondary to history of fatty liver disease-LDL is minimally elevated we'll continue to monitor as needed but at this point we'll not be aggressive

## 2012-10-19 ENCOUNTER — Non-Acute Institutional Stay (SKILLED_NURSING_FACILITY): Payer: Medicare Other | Admitting: Internal Medicine

## 2012-10-19 DIAGNOSIS — I2782 Chronic pulmonary embolism: Secondary | ICD-10-CM

## 2012-10-19 DIAGNOSIS — I6350 Cerebral infarction due to unspecified occlusion or stenosis of unspecified cerebral artery: Secondary | ICD-10-CM

## 2012-10-19 DIAGNOSIS — Z7901 Long term (current) use of anticoagulants: Secondary | ICD-10-CM

## 2012-10-19 DIAGNOSIS — I639 Cerebral infarction, unspecified: Secondary | ICD-10-CM

## 2012-10-19 DIAGNOSIS — I82409 Acute embolism and thrombosis of unspecified deep veins of unspecified lower extremity: Secondary | ICD-10-CM

## 2012-10-21 DIAGNOSIS — Z7901 Long term (current) use of anticoagulants: Secondary | ICD-10-CM | POA: Insufficient documentation

## 2012-10-21 NOTE — Progress Notes (Signed)
Patient ID: [name] [name], male   DOB: [birth year], 67 y.o.   MRN: [id number]  . Chief complaint acute visit secondary to anticoagulation management with history of DVT and pulmonary embolism.  History of present illness.  Patient is a pleasant elderly resident with a history of DVT and pulmonary embolism as well as CVA-he is on chronic anticoagulation management with Coumadin.  INR today is 2.38.  A month ago his INR was 2.1 He continues on alternating doses of 3-3.5 mg a day.  No reports of increased bruising or bleeding.  Family medical social history is reviewed.  Medications reviewed per MAR.  Review of systems.  Gen. no complaints of fever or chills.  Her skin-no reports of increased bruising or bleeding.  Respiratory-does not complaining of shortness of breath or cough.  Cardiac-no complaints of chest pain history of DVT as well as pulmonary embolism.  Muscle skeletal-does not complaining of joint pain today.  Physical exam.  Temperature is 97.0 pulse 81 respirations 20 blood pressure 112/78.  In general this is a well-nourished elderly male in no distress sitting to be in his wheelchair.  The skin is warm and dry did not note any increased bruising or bleeding.  Oropharynx clear mucous membranes moist  Eyes he does have an opacity of his left eye this is baseline I did not note any erythema-or drainage pupil on right appears reactive visual acuity intact.  Heart is regular rate and rhythm without murmur gallop or rub.  Chest he does have some scattered rhonchi this appears to be relatively baseline no labored breathing.  Labs.  As stated in history of present illness.  09/27/2012.  WBC 7.5 hemoglobin 14.9 platelets 252.  Sodium 139 potassium 4.3 BUN 14 creatinine 1.33.  Assessment and plan.  #1-history DVT pulmonary embolism on chronic anticoagulation with Coumadin-this appears to be stable will continue current dose of 3 alternating with 3.5 mg a  day and recheck this in one month.  I note he is not on an antibiotic-hemoglobin has been stable  [id number]

## 2012-11-09 ENCOUNTER — Other Ambulatory Visit: Payer: Self-pay | Admitting: *Deleted

## 2012-11-09 MED ORDER — LACRI-LUBE S.O.P. OP OINT: TOPICAL_OINTMENT | OPHTHALMIC | Status: DC

## 2012-12-13 ENCOUNTER — Non-Acute Institutional Stay (SKILLED_NURSING_FACILITY): Payer: Medicare Other | Admitting: Internal Medicine

## 2012-12-13 DIAGNOSIS — K5900 Constipation, unspecified: Secondary | ICD-10-CM

## 2012-12-13 DIAGNOSIS — Z7901 Long term (current) use of anticoagulants: Secondary | ICD-10-CM

## 2012-12-13 DIAGNOSIS — I6350 Cerebral infarction due to unspecified occlusion or stenosis of unspecified cerebral artery: Secondary | ICD-10-CM

## 2012-12-13 DIAGNOSIS — I2782 Chronic pulmonary embolism: Secondary | ICD-10-CM

## 2012-12-13 DIAGNOSIS — I639 Cerebral infarction, unspecified: Secondary | ICD-10-CM

## 2012-12-13 DIAGNOSIS — I10 Essential (primary) hypertension: Secondary | ICD-10-CM

## 2012-12-13 DIAGNOSIS — I82409 Acute embolism and thrombosis of unspecified deep veins of unspecified lower extremity: Secondary | ICD-10-CM

## 2012-12-13 NOTE — Progress Notes (Signed)
Patient ID: [name] [name], male   DOB: 1945-08-28, 67 y.o.   MRN: [id number]        Chief complaint-medical management of CVA late effect-hypertension-history DVT-history of pulmonary embolism-history pneumonia-.   History of present illness.   Patient is a pleasant elderly resident with the above diagnosis-he continues to be quite stable. His weight is stable hovering around 210 this has been the case for several months. He appears to have variable blood pressures recently a. 162/58 144/98 126/80   He continues on labetalol lisinopril.   He does have a history of DVT and pulmonary embolism he is on chronic Coumadin and this has been stable --INR today is 2.58.   He also has a history of pneumonia but this has been stable for some time he is on nebulizers.   He also has a history of borderline hyperlipidemia he also has a history of fatty liver disease so this has been somewhat conservative treatment recent lipid panel showed cholesterol 151 LDL minimally elevated at 284.   Today he has no acute complaints denies any chest pain shortness of breath or discomfort-continues to ambulate in his wheelchair around the facility    family medical social history has been reviewed per history and physical on 11/05/2009   Medications have been reviewed per [facility].   Review of systems.   As stated in history of present illness.   In general no complaints of fever or chills.   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   Does not complaining of any sore throat.   Respiratory-denies any shortness of breath or cough.   Cardiac-no complaints of chest pain.   GI-no complaints of nausea vomiting diarrhea constipation or abdominal discomfort.   Muscle skeletal-he does have some right-sided weakness status post CVA this is baseline and unchanged.--He does not complain of any joint pain   Neurologic-- does not complain of headache or  dizziness-t     Physical exam.   He is afebrile pulse is 73 respirations 20 blood pressure. 115/78 manual--occasionally has high readings as noted above Weight is stable at 210   In general this is a pleasant elderly male in no distress ambulating in his wheelchair.   The skin is warm and dry  .   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   Oropharynx is clear mucous membranes moist.   Chest-does have a minimal amount of rhonchi on expiration-no labored breathing this is baseline.   Heart is regular rate and rhythm without murmur gallop or rub he has trace lower extremity edema with obese changes.   Abdomen obese soft nontender with positive bowel sounds.   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   Neurologic-as stated above that she is able to ambulate in his wheelchair and this is his baseline.   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   Labs.  12/13/2012.  INR-2.58.  09/27/2012.  WBC 7.5 hemoglobin 14.9 platelets 252.  Sodium 139 potassium 4.3 BUN 14 creatinine 1.33   09/18/2012.   Cholesterol 151 triglycerides 71 LDL 105 HDL 32.   09/18/2012.   INR-2.11.   Liver function tests within normal limits except albumin of 3.0.   05/17/2012.   The CBC 6.2 hemoglobin 15.5 platelets  271.   Sodium 140 potassium 4 BUN 24 creatinine 1.2.   Assessment and plan.   Number -1-- CVA update ate effect-this appears to be baseline he does continue on Coumadin updated INR is pending \--also a CBC since he is on anticoagulation   #2-hypertension-at times does have elevated readings- but this does not appear consistent manually today.115/78-will write order to obtain blood pressures manually x3  with log for review--he does continue on lisinopril as well as labetalol --also will update BMP   #3 history DVT-again continues on anticoagulation at the INR is pending.   #4-history of pneumonia-this is a stable he is on routine nebulizers   #5-constipation-this appears stable current medications.   He is on lactulose.   #6-hyperlipidemia-again conservative here secondary to history of fatty liver disease-LDL is minimally elevated we'll continue to monitor as needed but at this point we'll not be aggressive  Of note Will repeat CBC and BMP for updated values  [id number]

## 2012-12-15 ENCOUNTER — Non-Acute Institutional Stay (SKILLED_NURSING_FACILITY): Payer: Medicare Other | Admitting: Internal Medicine

## 2012-12-15 DIAGNOSIS — I10 Essential (primary) hypertension: Secondary | ICD-10-CM

## 2012-12-15 NOTE — Progress Notes (Signed)
Patient ID: [name] [name], male   DOB: 02/14/1946, 67 y.o.   MRN: [id number]   This is an acute visit.  Level care skilled.  Facility [facility].  Chief complaint-acute visit followup hypertension.  History of present illness.  Patient is a pleasant elderly resident with a history of CVA as well as DVT and pulmonary embolism-.  He also has a history of hypertension he is on labetalol 200 mg a day as well as lisinopril 10 mg a day.  During a recent routine visit noted at times elevated systolics all the blood pressure when taken manually appeared to be stable.  I did order more frequent blood pressure checks they are listed as 140/86 150/88-152/78-I did take it manually today and it was 110/80.  All these apparently were done manually.  He does not complaining of any headache dizziness he appears to be at his baseline.  Family medical social history reviewed.  Medications reviewed  Review of systems-as stated in history of present illness.  Physical exam.  He is afebrile pulse is 72 respirations 18 blood pressure as noted above.  In general this is a well-nourished elderly male in no distress sitting in his wheelchair.  Skin is warm and dry.  Chest no labored breathing some scattered rhonchi that clear somewhat with cough this is baseline.  Heart is regular rate and rhythm without murmur gallop or rub.  Assessment and plan Hypertension.  At this point will order blood pressure checks 2 times a day-still getting variable readings but the blood pressure this afternoon certainly was not elevated-would like to see how he does through out various parts of the day before changing any medications.  [id number]  .  Hypertension

## 2013-02-28 ENCOUNTER — Non-Acute Institutional Stay (SKILLED_NURSING_FACILITY): Payer: Medicare Other | Admitting: Internal Medicine

## 2013-02-28 ENCOUNTER — Ambulatory Visit (HOSPITAL_COMMUNITY)
Admit: 2013-02-28 | Discharge: 2013-02-28 | Disposition: A | Discharge status: Skilled Nursing Facility | Payer: Medicare Other | Source: Skilled Nursing Facility | Attending: Internal Medicine | Admitting: Internal Medicine

## 2013-02-28 DIAGNOSIS — R042 Hemoptysis: Secondary | ICD-10-CM

## 2013-02-28 DIAGNOSIS — R05 Cough: Secondary | ICD-10-CM | POA: Insufficient documentation

## 2013-02-28 DIAGNOSIS — R059 Cough, unspecified: Secondary | ICD-10-CM | POA: Insufficient documentation

## 2013-02-28 DIAGNOSIS — I2782 Chronic pulmonary embolism: Secondary | ICD-10-CM

## 2013-02-28 DIAGNOSIS — Z7901 Long term (current) use of anticoagulants: Secondary | ICD-10-CM

## 2013-02-28 NOTE — Progress Notes (Signed)
Patient ID: [name] [name], male   DOB: [birth year], 67 y.o.   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 He also has a history of a pulmonary embolism and has remained on chronic Coumadin. He has remained stable for quite a period of time.  Apparently overnight he started coughing sputum mixed with blood. He is not complaining of chest pain shortness of breath or palpitations. His INR was last checked on 91 it was 2.24 on 4 mg of Coumadin. There is no suggestion that this is epistaxis, hematemesis. His vital signs are stable.   Past Medical History  Diagnosis Date  . Stroke   . Fatty liver   . Pneumonia   . Dysphasia   . Pulmonary embolism   . Hypertension    Medications; labetalol 200 mg daily lactulose 15 cc twice daily [constipation] duo nebs twice daily, lisinopril 10 mg daily, Coumadin 4 mg daily,   Review of systems; Gen; no weight loss Respiratory; no shortness of breath, no pleuritic chest Cardiac; no exertional chest pain, no palpitations GI; no abnormal pain, no change in bowel habits. No nausea or vomiting GU; no dysuria, no voiding difficulties Musculoskeletal; no joint pain  Physical exam Blood pressure 152/92, temperature is normal, respirations 18, O2 sat 93% HEENT, no evidence of blood coming from either nostril., Oral exam is completely normal Respiratory; shallow air entry at both bases worse on the right Cardiac; heart sounds are normal no murmurs. Abdomen distended no liver no spleen no stigmata of chronic liver disease Extremities; scant edema bilaterally but no evidence of a DVT  Impression/plan #1 hemoptysis this is actually mixed with sputum. Common things would be a bronchitis and/or pneumonitis. I don't see this obviously is coming from another non-pulmonary source. I am going to ask for blood work including a stat PT and  inr. I will order a portable chest x-ray. At this point I am going to try to manage this in the facility. I'm going to start him empirically on doxycycline as it is less likely to have any interaction with Coumadin. I'm going to monitor him carefully over the course of today to see if he needs hospitalization. #2 history of a pulmonary embolism on chronic Coumadin; and looking at his chart I just don't see the reason for the chronic Coumadin vis--vis antiplatelet agents etc. Nevertheless I am certainly not going to alter that today. His INR one week ago was 2.24 not suggestive of this being the obvious cause of his presenting problem

## 2013-03-05 ENCOUNTER — Non-Acute Institutional Stay (SKILLED_NURSING_FACILITY): Payer: Medicare Other | Admitting: Internal Medicine

## 2013-03-05 DIAGNOSIS — R042 Hemoptysis: Secondary | ICD-10-CM

## 2013-03-14 NOTE — Progress Notes (Signed)
Patient ID: [name] [name], male   DOB: 11-26-1945, 67 y.o.   MRN: [id number]           PROGRESS NOTE  DATE:  03/05/2013  FACILITY: [facility]    LEVEL OF CARE:   SNF   Acute Visit   CHIEF COMPLAINT:  Follow up hemoptysis.    HISTORY OF PRESENT ILLNESS:   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  He also has a history of pulmonary embolism and has remained on chronic Coumadin.    Last week, I saw him after he started coughing sputum mixed with blood clots.  He seemed stable at the time.  I went on to do a chest x-ray which showed chronic asymmetric coarse opacity in the left lung.  He remains on Coumadin.  His INR is therapeutic.  Out of concern for acute bronchitis, I put him on doxycycline last week.   The nurses report he tends to eat quickly and ignore his aspiration precautions, and they wondered if at least the coughing part of this was an aspiration type event.    REVIEW OF SYSTEMS:   CHEST/RESPIRATORY:  Both the staff and the patient say he is not coughing.  There have been no further issues.   CARDIAC:   No clear complaints of chest pain.    PHYSICAL EXAMINATION:   GENERAL APPEARANCE:  He looks well.   CHEST/RESPIRATORY:  Clear air entry bilaterally.   CARDIOVASCULAR:  CARDIAC:   Heart sounds are normal.  There are no murmurs.    ASSESSMENT/PLAN:  Hemoptysis/blood clots mixed with sputum.   He seems to be stable right now.  I have reviewed his chest x-ray.  I do not see anything on at least this portable PA x-ray that looks ominous.  For now, I am going to continue to monitor this.  If this is recurrent, he may need a CT scan of the chest.    CPT CODE: 78295

## 2013-04-10 ENCOUNTER — Non-Acute Institutional Stay (SKILLED_NURSING_FACILITY): Payer: Medicare Other | Admitting: Internal Medicine

## 2013-04-10 DIAGNOSIS — I6350 Cerebral infarction due to unspecified occlusion or stenosis of unspecified cerebral artery: Secondary | ICD-10-CM

## 2013-04-10 DIAGNOSIS — L0291 Cutaneous abscess, unspecified: Secondary | ICD-10-CM

## 2013-04-10 DIAGNOSIS — I639 Cerebral infarction, unspecified: Secondary | ICD-10-CM

## 2013-04-10 DIAGNOSIS — I10 Essential (primary) hypertension: Secondary | ICD-10-CM

## 2013-04-10 DIAGNOSIS — L0390 Cellulitis, unspecified: Secondary | ICD-10-CM

## 2013-04-10 DIAGNOSIS — I2782 Chronic pulmonary embolism: Secondary | ICD-10-CM

## 2013-04-10 DIAGNOSIS — I82409 Acute embolism and thrombosis of unspecified deep veins of unspecified lower extremity: Secondary | ICD-10-CM

## 2013-04-10 DIAGNOSIS — Z7901 Long term (current) use of anticoagulants: Secondary | ICD-10-CM

## 2013-04-10 NOTE — Progress Notes (Signed)
Patient ID: [name] [name], male   DOB: [birth year], 67 y.o.   MRN: [id number] This is a routine visit.  Level of care skilled.  Facility [facility].     Chief complaint-medical management of CVA late effect-hypertension-history DVT-history of pulmonary embolism--acute visit right second toe infection? .  History of present illness.  Patient is a pleasant elderly resident with the above diagnosis-he continues to be quite stable.  His weight is stable hovering around 208 this has been the case for several months.  He appears to have variable blood pressures I got 138/92 today I see listed 152/77 there has been variability in the past as well He continues on labetalol lisinopril.  He does have a history of DVT and pulmonary embolism he is on chronic Coumadin and this has been stable --INR today is 2.42.  He also has a history of pneumonia but this has been stable for some time he is on nebulizers Dr. Leanord Hawking did see him last month for some blood-tinged sputum-apparently this has not reoccurred chest x-ray was not alarming we have been monitoring this he has been stable.  He also has a history of borderline hyperlipidemia he also has a history of fatty liver disease so this has been somewhat conservative treatment recent lipid panel showed cholesterol of 161 triglycerides 79 HDL 34 LDL 111.  Today he has no acute complaints denies any chest pain shortness of breath or discomfort-continues to ambulate in his wheelchair around the facility Nursing staff has noted a small lesion that apparently is draining small amount of exudate distal aspect of his second right toe they feel he shoes may need adjustment   family medical social history has been reviewed per history and physical on 11/05/2009   Medications have been reviewed per [facility] .  Review of systems.  As stated in history of present illness.  In general no complaints of fever or chills Skin does not complain of any pain however total apparently   right second toe is draining a small amount of exudate.  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  Does not complaining of any sore throat.  Respiratory-denies any shortness of breath or cough.  Cardiac-no complaints of chest pain.  GI-no complaints of nausea vomiting diarrhea constipation or abdominal discomfort.  Muscle skeletal-he does have some right-sided weakness status post CVA this is baseline and unchanged.--He does not complain of any joint pain  Neurologic-- does not complain of headache or dizziness t  Physical exam.   Temperature is 97.1 pulse 63 respirations 20 blood pressure 138/92 manually today weight is stable at 208.4  Weight is stable at 210  In general this is a pleasant elderly male in no distress ambulating in his wheelchair.  The skin is warm and dry--right second toe there is a small open area distal aspect looks like it's drained a small amount of exudate although I cannot really tell the color this is more on the bandage.    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  Oropharynx is clear mucous membranes moist.  Chest-does have a minimal amount of rhonchi on expiration-no labored breathing this is baseline.  Heart is regular rate and rhythm without murmur gallop or rub he has trace lower extremity edema with obese changes.  Abdomen obese soft nontender with positive bowel sounds.  Muscle skeletal I did not note any deformities he does have right-sided weakness upper and lower extremities does have  contracture of his right hand he does have some movement-this is baseline.  Neurologic-as stated above that she is able to ambulate in his wheelchair and this is his baseline.  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   Labs 04/10/2013.  INR-2.42.  03/14/2013.  Cholesterol 161 triglycerides 79 HDL 34 LDL 111.  Liver function  tests within normal limits except albumin of 3.2.  02/28/2013.  WBC 6.9 hemoglobin 15.4 platelets 254.  Sodium 140 potassium 3.8 BUN 15 creatinine 1.04.  .  12/13/2012.  INR-2.58.  09/27/2012.  WBC 7.5 hemoglobin 14.9 platelets 252.  Sodium 139 potassium 4.3 BUN 14 creatinine 1.33  09/18/2012.  Cholesterol 151 triglycerides 71 LDL 105 HDL 32.  09/18/2012.  INR-2.11.  Liver function tests within normal limits except albumin of 3.0.  05/17/2012.  The CBC 6.2 hemoglobin 15.5 platelets 271.  Sodium 140 potassium 4 BUN 24 creatinine 1.2 .  Assessment and plan.  Number -1-- CVA update ate effect-this appears to be baseline-he continues on Coumadin for anticoagulation-Will recheck INR in 2 weeks #2-hypertension-at times does have elevated readings--will write order to obtain blood pressures manually xone week with log for review--he does continue on lisinopril as well as labetalol - when we have doenthis  before blood pressures were not consistently elevated we'll see what we find this time #3 history DVT-again continues on anticoagulation as noted above  #4-history of pneumonia-this is a stable he is on routine nebulizers  #5-constipation-this appears stable current medications.  He is on lactulose.  #6-hyperlipidemia-again conservative here secondary to history of fatty liver disease-LDL is minimally elevated we'll continue to monitor as needed but at this point we'll not be aggressive  #7-cellulitis infection left toe-Will treat with a short course of doxycycline for 7 days and monitor also nursing will discuss with family about getting some different shoes   [phone]

## 2013-06-22 ENCOUNTER — Ambulatory Visit (HOSPITAL_COMMUNITY): Admission: AD | Admit: 2013-06-22 | Payer: Medicare Other | Source: Ambulatory Visit | Admitting: Internal Medicine

## 2013-06-22 ENCOUNTER — Ambulatory Visit (HOSPITAL_COMMUNITY): Payer: Medicare Other | Attending: Internal Medicine

## 2013-06-22 ENCOUNTER — Non-Acute Institutional Stay (SKILLED_NURSING_FACILITY): Payer: Medicare Other | Admitting: Internal Medicine

## 2013-06-22 DIAGNOSIS — M79609 Pain in unspecified limb: Secondary | ICD-10-CM | POA: Insufficient documentation

## 2013-06-22 DIAGNOSIS — I6350 Cerebral infarction due to unspecified occlusion or stenosis of unspecified cerebral artery: Secondary | ICD-10-CM

## 2013-06-22 DIAGNOSIS — I10 Essential (primary) hypertension: Secondary | ICD-10-CM

## 2013-06-22 DIAGNOSIS — Z7901 Long term (current) use of anticoagulants: Secondary | ICD-10-CM

## 2013-06-22 DIAGNOSIS — I639 Cerebral infarction, unspecified: Secondary | ICD-10-CM

## 2013-06-22 DIAGNOSIS — M25539 Pain in unspecified wrist: Secondary | ICD-10-CM | POA: Insufficient documentation

## 2013-06-22 DIAGNOSIS — I82409 Acute embolism and thrombosis of unspecified deep veins of unspecified lower extremity: Secondary | ICD-10-CM

## 2013-06-22 DIAGNOSIS — M79641 Pain in right hand: Secondary | ICD-10-CM

## 2013-06-24 NOTE — Progress Notes (Signed)
Patient ID: EMIEL KIELTY, male   DOB: 06/08/1946, 68 y.o.   MRN: [id number]     This is a routine visit.  Level of care skilled.  Facility [facility].  Chief complaint-medical management of CVA late effect-hypertension-history DVT-history of pulmonary embolism--acute visit right second toe infection?  .  History of present illness.  Patient is a pleasant elderly resident with the above diagnosis-he continues to be quite stable.  His weight is stable hovering around 209 this has been the case for several months  Blood.  Pressures t appear to be fairly stable with systolics in the 130s most recent diastolic was in the 80s Will continue to monitor   He continues on labetalol lisinopril.  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  He also has a history of borderline hyperlipidemia he also has a history of fatty liver disease so this has been somewhat conservative treatment recent lipid panel showed cholesterol of 161 triglycerides 79 HDL 34 LDL 111.  Today he has no acute complaints denies any chest pain shortness of breath or discomfort other than right hand issues as noted above also says this extends somewhat up his arm at times-continues to ambulate in his wheelchair around the facility    family medical social history has been reviewed per history and physical on 11/05/2009  Medications have been reviewed per [facility]  .  Review of systems.  As stated in history of present illness.  In general no complaints of fever or chills  Skin does not complain of any pain  .  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  Does not complaining of any sore throat.  Respiratory-denies any shortness of breath or cough.  Cardiac-no complaints of chest pain.  GI-no complaints of nausea vomiting diarrhea constipation or abdominal discomfort.  Muscle skeletal-he does have some right-sided weakness status post CVA this is baseline and unchanged.-- complaints of right arm and right hand issues as noted above  Neurologic-- does not complain of headache or dizziness  t  Physical exam.  Temperature 97.5 pulse 77 respirations 18 blood pressure 138/89 weight is stable at 209.8   In general this is a pleasant elderly male in no distress ambulating in his wheelchair.  The skin is warm and dry- e.  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  Oropharynx is clear mucous membranes moist.  Chest-does have a minimal amount of rhonchi on expiration-no labored breathing this is baseline.  Heart is regular rate and rhythm without murmur gallop or rub he has trace lower extremity edema with obese changes.  Abdomen obese soft nontender with positive bowel sounds.  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   Labs  06/20/2013.  INR-2.54.   04/10/2013.  INR-2.42.  03/14/2013.  Cholesterol 161 triglycerides 79  HDL 34 LDL 111.  Liver function tests within normal limits except albumin of 3.2.  02/28/2013.  WBC 6.9 hemoglobin 15.4 platelets 254.  Sodium 140 potassium 3.8 BUN 15 creatinine 1.04. Liver function tests within normal limits except albumin of 3.1  .  12/13/2012.  INR-2.58.  09/27/2012.  WBC 7.5 hemoglobin 14.9 platelets 252.  Sodium 139 potassium 4.3 BUN 14 creatinine 1.33  09/18/2012.  Cholesterol 151 triglycerides 71 LDL 105 HDL 32.  09/18/2012.  INR-2.11.  Liver function tests within normal limits except albumin of 3.0.  05/17/2012.  The CBC 6.2 hemoglobin 15.5 platelets 271.  Sodium 140 potassium 4 BUN 24 creatinine 1.2  .  Assessment and plan.  Number -1-- CVA update ate effect-this appears to be baseline-he continues on Coumadin for anticoagulation-update INR has been ordered  #2-hypertension- Continues to have some variability but I do not see consistent elevations continue to monitor #3 history DVT-again continues on anticoagulation as noted above  #4-history of pneumonia-this is a stable he is on routine nebulizers  #5-constipation-this appears stable current medications.  He is on lactulose.  #6-hyperlipidemia-again conservative here secondary to history of fatty liver disease-LDL is minimally elevated we'll continue to monitor as needed but at this point we'll not be aggressive --Will update CMP #7-right hand and arm issues-Will obtain an x-ray I do not really see any evidence of a DVT or acute changes --he does receive Tylenol for pain which appears to be effective  Of note we'll update CBC CMP for updated values  [id number]

## 2013-09-03 ENCOUNTER — Encounter: Payer: Self-pay | Admitting: *Deleted

## 2013-10-16 ENCOUNTER — Non-Acute Institutional Stay (SKILLED_NURSING_FACILITY): Payer: Medicare Other | Admitting: Internal Medicine

## 2013-10-16 DIAGNOSIS — Z7901 Long term (current) use of anticoagulants: Secondary | ICD-10-CM

## 2013-10-16 DIAGNOSIS — I6350 Cerebral infarction due to unspecified occlusion or stenosis of unspecified cerebral artery: Secondary | ICD-10-CM

## 2013-10-16 DIAGNOSIS — I10 Essential (primary) hypertension: Secondary | ICD-10-CM

## 2013-10-16 DIAGNOSIS — J189 Pneumonia, unspecified organism: Secondary | ICD-10-CM

## 2013-10-16 DIAGNOSIS — I82409 Acute embolism and thrombosis of unspecified deep veins of unspecified lower extremity: Secondary | ICD-10-CM

## 2013-10-16 DIAGNOSIS — I2782 Chronic pulmonary embolism: Secondary | ICD-10-CM

## 2013-10-16 DIAGNOSIS — I639 Cerebral infarction, unspecified: Secondary | ICD-10-CM

## 2013-10-16 NOTE — Progress Notes (Signed)
Patient ID: [name] [name], male   DOB: 08/27/1945, 68 y.o.   MRN: [id number]   This is a routine visit.  Level of care skilled.  [facility]  Chief complaint-medical management of CVA late effect-hypertension-history DVT-history of pulmonary embolism--a  .  History of present illness.  Patient is a pleasant elderly resident with the above diagnosis-he continues to be quite stable.  His weight is stable hovering around 210 this has been the case for several months Blood.  Pressures t appear to be fairly stable with systolics in the 130s most recent diastolic was in the 80s Will continue to monitor -- 138/88  taken manually this evening He continues on labetalol lisinopril.  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  He also has a history of borderline hyperlipidemia he also has a history of fatty liver disease so this has been somewhat conservative treatment recent lipid panel showed cholesterol of 161 triglycerides 79 HDL 34 LDL 111.  Today he has no acute complaints denies any chest pain shortness of breath or discomfort --  -continues to ambulate in his wheelchair around the facility   Family medical social history has been reviewed per history and physical on 11/05/2009   [facility]  [facility]  Review of systems.  As stated in history of present illness.  In general no complaints of fever or chills  Skin does not complain of any pain .  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  Does not complaining of any sore throat.  Respiratory-denies any shortness of breath or cough.  Cardiac-no complaints of chest pain.  GI-no complaints of nausea vomiting diarrhea constipation or  abdominal discomfort.  Muscle skeletal-he does have some right-sided weakness status post CVA this is baseline and unchanged.-- complaints of right arm and right hand issues as noted above  Neurologic-- does not complain of headache or dizziness  t  Physical exam. Temperature 97.2 pulse 72 respirations 20 blood pressure 138/88-weight 210.8 this is stable    In general this is a pleasant elderly male in no distress ambulating in his wheelchair.  The skin is warm and dry-  e.  [name].  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  Oropharynx is clear mucous membranes moist.  Chest-does have a minimal amount of rhonchi on expiration-no labored breathing this is baseline.  Heart is regular rate and rhythm without murmur gallop or rub he has trace lower extremity edema with obese changes.  Abdomen obese soft nontender with positive bowel sounds.  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   Labs 10/08/2013.  INR 2.49.  06/25/2013.  WBC 7.0 hemoglobin 15.0 platelets 273.  Sodium 141 potassium 4.1 BUN 16 creatinine 1.17.  Liver function tests within normal limits except  albumin of 3.2.       06/20/2013.  INR-2.54.  04/10/2013.  INR-2.42.  03/14/2013.  Cholesterol 161 triglycerides 79 HDL 34 LDL 111.  Liver function tests within normal limits except albumin of 3.2.  02/28/2013.  WBC 6.9 hemoglobin 15.4 platelets 254.  Sodium 140 potassium 3.8 BUN 15 creatinine 1.04.  Liver function tests within normal limits except albumin of 3.1  .  12/13/2012.  INR-2.58.  09/27/2012.  WBC 7.5 hemoglobin 14.9 platelets  252.  Sodium 139 potassium 4.3 BUN 14 creatinine 1.33  09/18/2012.  Cholesterol 151 triglycerides 71 LDL 105 HDL 32.  09/18/2012.  INR-2.11.  Liver function tests within normal limits except albumin of 3.0.  05/17/2012.  The CBC 6.2 hemoglobin 15.5 platelets 271.  Sodium 140 potassium 4 BUN 24 creatinine 1.2  .  Assessment and plan.  Number -1-- CVA update ate effect-this appears to be baseline-he continues on Coumadin for anticoagulation-update INR has been ordered  #2-hypertension-  Continues to have some variability but I do not see consistent elevations continue to monitor  #3 history DVT-again continues on anticoagulation as noted above  #4-history of pneumonia-this is a stable he is on routine nebulizers  #5-constipation-this appears stable current medications.  He is on lactulose.  #6-hyperlipidemia-again conservative here secondary to history of fatty liver disease-LDL is minimally elevated we'll continue to monitor as needed but at this point we'll not be aggressive --Will update lipid panel and liver function tests    Of note we'll update CBC CMP lipid panel for updated values   [phone]

## 2013-12-06 ENCOUNTER — Non-Acute Institutional Stay (SKILLED_NURSING_FACILITY): Payer: Medicare Other | Admitting: Internal Medicine

## 2013-12-06 DIAGNOSIS — I10 Essential (primary) hypertension: Secondary | ICD-10-CM

## 2013-12-06 DIAGNOSIS — I2782 Chronic pulmonary embolism: Secondary | ICD-10-CM

## 2013-12-06 DIAGNOSIS — K5900 Constipation, unspecified: Secondary | ICD-10-CM

## 2013-12-06 DIAGNOSIS — I6359 Cerebral infarction due to unspecified occlusion or stenosis of other cerebral artery: Secondary | ICD-10-CM

## 2013-12-06 DIAGNOSIS — I82409 Acute embolism and thrombosis of unspecified deep veins of unspecified lower extremity: Secondary | ICD-10-CM

## 2013-12-06 DIAGNOSIS — Z7901 Long term (current) use of anticoagulants: Secondary | ICD-10-CM

## 2013-12-06 DIAGNOSIS — I6350 Cerebral infarction due to unspecified occlusion or stenosis of unspecified cerebral artery: Secondary | ICD-10-CM

## 2013-12-06 NOTE — Progress Notes (Signed)
Patient ID: [place] T B[place], male   DOB: 1946/04/24, 68 y.o.   MRN: [id number]   This is a routine visit.  Level of care skilled.  [facility]   Chief complaint-medical management of CVA late effect-hypertension-history DVT-history of pulmonary embolism--  .  History of present illness.  Patient is a pleasant elderly resident with the above diagnosis-he continues to be quite stable.  His weight is stable hovering around 205-210 this has been the case for several months Blood.  Pressures t appear to be fairly stable most recently 127/61  He continues on labetalol lisinopril.  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  He also has a history of borderline hyperlipidemia he also has a history of fatty liver disease so this has been somewhat conservative treatment recent lipid panel showed cholesterol of 162 triglycerides 61 HDL 34 LDL 116.  Today he has no acute complaints denies any chest pain shortness of breath or discomfort --  -continues to ambulate in his wheelchair around the facility   Family medical social history has been reviewed per history and physical on 11/05/2009   [facility]  [facility]  Review of systems.  As stated in history of present illness.  In general no complaints of fever or chills  Skin does not complain of any pain .  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  Does not complaining of any sore throat.  Respiratory-denies any shortness of breath or cough.  Cardiac-no complaints of chest pain.  GI-no complaints of nausea vomiting diarrhea constipation or abdominal discomfort.  Muscle skeletal-he does have some right-sided weakness status post CVA this is baseline and unchanged.--  Neurologic-- does  not complain of headache or dizziness  t  Physical exam.  Temperature 97.1 pulse 71 respirations 19 blood pressure 127/61 weight is 206  In general this is a pleasant elderly male in no distress ambulating in his wheelchair.   skin is warm and dry-  e.  [name].  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  Oropharynx is clear mucous membranes moist.  Chest-does have a minimal amount of rhonchi on expiration-no labored breathing this is baseline.  Heart is regular rate and rhythm without murmur gallop or rub he has trace lower extremity edema with obese changes.  Abdomen obese soft nontender with positive bowel sounds.  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  Labs 11/21/2013.  INR-2.42  10/17/2013.  Cholesterol 162 triglycerides 61 HDL 34 LDL 116.  White blood cells 6.8 hemoglobin 15.2 platelets 275.  Sodium 141 potassium 4.1 BUN 16 creatinine 1.16.  Liver function tests within normal limits except albumin of 3.2   10/08/2013.  INR 2.49.  06/25/2013.  WBC 7.0 hemoglobin 15.0 platelets 273.  Sodium 141 potassium 4.1 BUN 16 creatinine 1.17.  Liver function tests within normal limits except  albumin of 3.2.  06/20/2013.  INR-2.54.  04/10/2013.  INR-2.42.  03/14/2013.  Cholesterol 161 triglycerides 79 HDL 34 LDL 111.  Liver function tests within normal limits except albumin of 3.2.  02/28/2013.  WBC 6.9 hemoglobin 15.4 platelets 254.  Sodium 140 potassium 3.8 BUN 15 creatinine 1.04.  Liver function tests within normal limits except albumin of 3.1  .  12/13/2012.  INR-2.58.  09/27/2012.  WBC 7.5  hemoglobin 14.9 platelets 252.  Sodium 139 potassium 4.3 BUN 14 creatinine 1.33  09/18/2012.  Cholesterol 151 triglycerides 71 LDL 105 HDL 32.  09/18/2012.  INR-2.11.  Liver function tests within normal limits except albumin of 3.0.  05/17/2012.  The CBC 6.2 hemoglobin 15.5 platelets 271.  Sodium 140 potassium 4 BUN 24 creatinine 1.2  .  Assessment and plan.  Number -1-- CVA update  late baseline-he continues on Coumadin for anticoagulation-update INR has been ordered  #2-hypertension-  Continues to have some variability but I do not see consistent elevations continue to monitor  #3 history DVT-again continues on anticoagulation as noted above  #4-history of pneumonia-this is a stable he is on routine nebulizers  #5-constipation-this appears stable current medications.  He is on lactulose.  #6-hyperlipidemia-again conservative here secondary to history of fatty liver disease-LDL is minimally elevated we'll continue to monitor as needed but at this point we'll not be aggressive --    [phone]

## 2014-03-20 ENCOUNTER — Non-Acute Institutional Stay (SKILLED_NURSING_FACILITY): Payer: Medicare Other | Admitting: Internal Medicine

## 2014-03-20 DIAGNOSIS — I639 Cerebral infarction, unspecified: Secondary | ICD-10-CM | POA: Insufficient documentation

## 2014-03-20 DIAGNOSIS — Z7901 Long term (current) use of anticoagulants: Secondary | ICD-10-CM

## 2014-03-20 DIAGNOSIS — I82409 Acute embolism and thrombosis of unspecified deep veins of unspecified lower extremity: Secondary | ICD-10-CM

## 2014-03-20 DIAGNOSIS — I10 Essential (primary) hypertension: Secondary | ICD-10-CM

## 2014-03-20 DIAGNOSIS — I6350 Cerebral infarction due to unspecified occlusion or stenosis of unspecified cerebral artery: Secondary | ICD-10-CM

## 2014-03-20 NOTE — Progress Notes (Signed)
Patient ID: [name] [name], male   DOB: [birth year], 68 y.o.   MRN: [id number] This is a routine visit.  Level of care skilled.  Facility [facility].   Chief complaint-medical management of CVA late effect-hypertension-history DVT-history of pulmonary embolism--  .  History of present illness.  Patient is a pleasant elderly resident with the above diagnosis-he continues to be quite stable.  His weight is stable hovering around 205-210 this has been the case for several months .  Pressures t appear to be somewhat variable-most recently 142/93-153/95-123/84-I did take it manually today and and pressure was 140/96  He continues on labetalol 200 mg a day as well as lisinopril 10 mg a day.  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  He also has a history of borderline hyperlipidemia he also has a history of fatty liver disease so this has been somewhat conservative treatment recent lipid panel showed cholesterol of 162 triglycerides 61 HDL 34 LDL 116.   Today he has no acute complaints denies any chest pain shortness of breath or discomfort --  -continues to ambulate in his wheelchair around the facility   Family medical social history has been reviewed per history and physical on 11/05/2009  Medications have been reviewed per [facility]  .  Review of systems.  As stated in history of present illness.  In general no complaints of fever or chills  Skin does not complain of any pain .  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  Does not complaining of any sore throat.  Respiratory-denies any shortness of breath or cough.  Cardiac-no complaints of chest pain.  GI-no complaints of nausea vomiting diarrhea constipation or abdominal discomfort.  Muscle skeletal-he does have some right-sided weakness  status post CVA this is baseline and unchanged.--  Neurologic-- does not complain of headache or dizziness  t  Physical exam.   Temperature 96.0 pulse 76 respirations 18 blood pressure taken manually 140/96 weight is stable at 206  In general this is a pleasant elderly male in no distress ambulating in his wheelchair.  skin is warm and dry-  e.  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  Oropharynx is clear mucous membranes moist.  Chest-does have a minimal amount of rhonchi on expiration-no labored breathing this is baseline.  Heart is regular rate and rhythm without murmur gallop or rub he has trace lower extremity edema with obese changes.  Abdomen obese soft nontender with positive bowel sounds.  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  Labs 03/20/2014.  INR 2.69     11/21/2013.  INR-2.42  10/17/2013.  Cholesterol 162 triglycerides 61 HDL 34 LDL 116.  White blood cells 6.8 hemoglobin 15.2 platelets 275.  Sodium 141 potassium 4.1 BUN 16 creatinine 1.16.  Liver function tests within normal limits except albumin of 3.2   10/08/2013.  INR 2.49.  06/25/2013.  WBC  7.0 hemoglobin 15.0 platelets 273.  Sodium 141 potassium 4.1 BUN 16 creatinine 1.17.  Liver function tests within normal limits except albumin of 3.2.   06/20/2013.  INR-2.54.  04/10/2013.  INR-2.42.  03/14/2013.  Cholesterol 161 triglycerides 79 HDL 34 LDL 111.  Liver function tests within normal limits except albumin of 3.2.  02/28/2013.  WBC 6.9 hemoglobin 15.4 platelets 254.  Sodium 140 potassium 3.8 BUN 15 creatinine 1.04.   Liver function tests within normal limits except albumin of 3.1  .  12/13/2012.  INR-2.58.  09/27/2012.  WBC 7.5 hemoglobin 14.9 platelets 252.  Sodium 139 potassium 4.3 BUN 14 creatinine 1.33  09/18/2012.  Cholesterol 151 triglycerides 71 LDL 105 HDL 32.  09/18/2012.  INR-2.11.  Liver function tests within normal limits except albumin of 3.0.  05/17/2012.  The CBC 6.2 hemoglobin 15.5 platelets 271.  Sodium 140 potassium 4 BUN 24 creatinine 1.2  .  Assessment and plan.   Number -1-- CVA u--there is a baseline--he continues on Coumadin for anticoagulation-update INR has been ordered-continue current Coumadin dose--also will update CBC   #2-hypertension-  Continues to have some variability but appears to be more on the higher end systolically and diastolically-will increase lisinopril to 20 mg a day-continue labetalol-check blood pressures twice a day with a log in provider for review next week   #3 history DVT-again continues on anticoagulation as noted above   #4-history of pneumonia-this is a stable he is on routine nebulizers   #5-constipation-this appears stable current medications.  He is on lactulose.   #6-hyperlipidemia-again conservative here secondary to history of fatty liver disease-LDL is minimally elevated we'll continue to monitor as needed but at this point we'll not be aggressive -- Will update liver function tests--and lipid panel  [phone]

## 2014-05-06 ENCOUNTER — Other Ambulatory Visit: Payer: Self-pay

## 2014-05-06 MED ORDER — IPRATROPIUM-ALBUTEROL 0.5-2.5 (3) MG/3ML IN SOLN: RESPIRATORY_TRACT | Status: DC

## 2014-05-06 NOTE — Telephone Encounter (Signed)
RX faxed to [facility] @ [phone]. Phone number [phone]  

## 2014-07-24 ENCOUNTER — Non-Acute Institutional Stay (SKILLED_NURSING_FACILITY): Payer: Medicare Other | Admitting: Internal Medicine

## 2014-07-24 ENCOUNTER — Encounter (HOSPITAL_COMMUNITY)
Admission: RE | Admit: 2014-07-24 | Discharge: 2014-07-24 | Disposition: A | Discharge status: Home / Self Care | Payer: Medicare Other | Source: Skilled Nursing Facility | Attending: Internal Medicine | Admitting: Internal Medicine

## 2014-07-24 DIAGNOSIS — I10 Essential (primary) hypertension: Secondary | ICD-10-CM

## 2014-07-24 DIAGNOSIS — I639 Cerebral infarction, unspecified: Secondary | ICD-10-CM

## 2014-07-24 DIAGNOSIS — Z7901 Long term (current) use of anticoagulants: Secondary | ICD-10-CM | POA: Insufficient documentation

## 2014-07-24 DIAGNOSIS — I82409 Acute embolism and thrombosis of unspecified deep veins of unspecified lower extremity: Secondary | ICD-10-CM

## 2014-07-24 LAB — PROTIME-INR
INR: 2.02 — ABNORMAL HIGH (ref 0.00–1.49)
PROTHROMBIN TIME: 23 s — AB (ref 11.6–15.2)

## 2014-07-24 NOTE — Progress Notes (Signed)
Patient ID: [name] [name], male   DOB: 1946-02-13, 69 y.o.   MRN: [id number]   This is a routine visit.  Level of care skilled.  [facility]   Chief complaint-medical management of CVA late effect-hypertension-history DVT-history of pulmonary embolism--  .  History of present illness.  Patient is a pleasant elderly resident with the above diagnosis-he continues to be quite stable.  His weight is stable hovering around 205-210 this has been the case for several months .  Pressures t appear to be somewhat variable-most recently 141/85-143/92-I did take it manually today and and pressure was 118/80 He continues on labetalol 200 mg a day as well as lisinopril 20 mg a day.  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  He also has a history of borderline hyperlipidemia he also has a history of fatty liver disease so this has been somewhat conservative treatment recent lipid panel showed cholesterol of 156 triglycerides 62 HDL 33 LDL 111.   Today he has no acute complaints denies any chest pain shortness of breath or discomfort --  -continues to ambulate in his wheelchair around the facility   Family medical social history has been reviewed per history and physical on 11/05/2009   [facility]  [facility]  Review of systems.  As stated in history of present illness.  In general no complaints of fever or chills  Skin does not complain of any pain .  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  Does not complaining of any sore throat.  Respiratory-denies any shortness of breath or cough.  Cardiac-no complaints of chest pain.  GI-no complaints of nausea vomiting diarrhea constipation or abdominal discomfort.  Muscle skeletal-he does have some right-sided weakness status  post CVA this is baseline and unchanged.--  Neurologic-- does not complain of headache or dizziness  t  Physical exam.   Temperature 97.0 pulse 78 respirations 16 blood pressure 118/80 weight is stable at 205.8  In general this is a pleasant elderly male in no distress ambulating in his wheelchair.  skin is warm and dry-  e.  [name].  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  Oropharynx is clear mucous membranes moist.  Chest-does have a minimal amount of rhonchi on expiration-no labored breathing this is baseline.  Heart is regular rate and rhythm without murmur gallop or rub he has trace lower extremity edema with obese changes.  Abdomen obese soft nontender with positive bowel sounds.  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  Labs  Jan 23rd 2016.  INR-2.02.  03/21/2014.  WBC 11.0 hemoglobin 15.1 platelets 280.  Sodium 140 potassium 4.4 BUN 20 creatinine 1.22.  Liver function tests within normal limits except albumin of 3.1.     03/20/2014.  INR 2.69     11/21/2013.  INR-2.42  10/17/2013.  Cholesterol 162 triglycerides 61 HDL 34 LDL 116.  White blood cells 6.8 hemoglobin 15.2 platelets 275.  Sodium 141 potassium 4.1 BUN 16 creatinine 1.16.  Liver function tests within normal limits except albumin of 3.2   10/08/2013.  INR 2.49.  06/25/2013.  WBC 7.0 hemoglobin 15.0 platelets 273.  Sodium 141 potassium 4.1 BUN 16 creatinine 1.17.  Liver function tests within normal limits except albumin of 3.2.   06/20/2013.  INR-2.54.  04/10/2013.  INR-2.42.  03/14/2013.  Cholesterol 161  triglycerides 79 HDL 34 LDL 111.  Liver function tests within normal limits except albumin of 3.2.  02/28/2013.  WBC 6.9 hemoglobin 15.4 platelets 254.  Sodium 140 potassium 3.8 BUN 15 creatinine 1.04.  Liver function tests within normal limits except albumin of 3.1  .  12/13/2012.  INR-2.58.  09/27/2012.  WBC 7.5 hemoglobin 14.9 platelets 252.  Sodium 139 potassium 4.3 BUN 14 creatinine 1.33  09/18/2012.  Cholesterol 151 triglycerides 71 LDL 105 HDL 32.  09/18/2012.  INR-2.11.  Liver function tests within normal limits except albumin of 3.0.  05/17/2012.  The CBC 6.2 hemoglobin 15.5 platelets 271.  Sodium 140 potassium 4 BUN 24 creatinine 1.2  .  Assessment and plan.   Number -1-- CVA --this is a baseline--he continues on Coumadin for anticoagulation-INR is therapeutic this is been quite stable for some time will update this in 2 weeks   #2-hypertension- continues on labetalol and lisinopril-appears to have some variable systolics however manual  pressure this evening was quite unremarkable at this point will monitor-   #3 history DVT-again continues on anticoagulation as noted above--will update an INR next week   #4-history of pneumonia-this is a stable he is on routine nebulizers   #5-constipation-this appears stable current medications.  He is on lactulose.   #6-hyperlipidemia-again conservative here secondary to history of fatty liver disease-LDL is minimally elevated we'll continue to monitor as needed but at this point we'll not be aggressive --   Update a CBC and CMP for updated values  [id number]

## 2014-07-31 ENCOUNTER — Encounter (HOSPITAL_COMMUNITY)
Admission: RE | Admit: 2014-07-31 | Discharge: 2014-07-31 | Disposition: A | Discharge status: Home / Self Care | Payer: Medicare Other | Source: Skilled Nursing Facility | Attending: Internal Medicine | Admitting: Internal Medicine

## 2014-07-31 ENCOUNTER — Other Ambulatory Visit (HOSPITAL_COMMUNITY)
Admission: RE | Admit: 2014-07-31 | Discharge: 2014-07-31 | Disposition: A | Discharge status: Home / Self Care | Payer: Medicare Other | Source: Ambulatory Visit | Attending: Internal Medicine | Admitting: Internal Medicine

## 2014-07-31 DIAGNOSIS — Z7901 Long term (current) use of anticoagulants: Secondary | ICD-10-CM | POA: Diagnosis not present

## 2014-07-31 LAB — COMPREHENSIVE METABOLIC PANEL
ALT: 13 U/L (ref 0–53)
AST: 17 U/L (ref 0–37)
Albumin: 3.3 g/dL — ABNORMAL LOW (ref 3.5–5.2)
Alkaline Phosphatase: 48 U/L (ref 39–117)
Anion gap: 7 (ref 5–15)
BUN: 16 mg/dL (ref 6–23)
CHLORIDE: 103 mmol/L (ref 96–112)
CO2: 30 mmol/L (ref 19–32)
Calcium: 8.7 mg/dL (ref 8.4–10.5)
Creatinine, Ser: 1.15 mg/dL (ref 0.50–1.35)
GFR, EST AFRICAN AMERICAN: 74 mL/min — AB (ref >90)
GFR, EST NON AFRICAN AMERICAN: 64 mL/min — AB (ref >90)
GLUCOSE: 72 mg/dL (ref 70–99)
Potassium: 3.8 mmol/L (ref 3.5–5.1)
SODIUM: 140 mmol/L (ref 135–145)
Total Bilirubin: 0.8 mg/dL (ref 0.3–1.2)
Total Protein: 7 g/dL (ref 6.0–8.3)

## 2014-07-31 LAB — CBC
HCT: 45.1 % (ref 39.0–52.0)
Hemoglobin: 14.7 g/dL (ref 13.0–17.0)
MCH: 30.7 pg (ref 26.0–34.0)
MCHC: 32.6 g/dL (ref 30.0–36.0)
MCV: 94.2 fL (ref 78.0–100.0)
Platelets: 280 10*3/uL (ref 150–400)
RBC: 4.79 MIL/uL (ref 4.22–5.81)
RDW: 13.7 % (ref 11.5–15.5)
WBC: 8.5 10*3/uL (ref 4.0–10.5)

## 2014-07-31 LAB — PROTIME-INR
INR: 2.26 — ABNORMAL HIGH (ref 0.00–1.49)
Prothrombin Time: 25.2 seconds — ABNORMAL HIGH (ref 11.6–15.2)

## 2014-08-13 ENCOUNTER — Encounter (HOSPITAL_COMMUNITY)
Admission: RE | Admit: 2014-08-13 | Discharge: 2014-08-13 | Disposition: A | Discharge status: Home / Self Care | Payer: Medicare Other | Source: Skilled Nursing Facility | Attending: Internal Medicine | Admitting: Internal Medicine

## 2014-08-13 DIAGNOSIS — Z7901 Long term (current) use of anticoagulants: Secondary | ICD-10-CM | POA: Diagnosis not present

## 2014-08-13 LAB — PROTIME-INR
INR: 2.15 — ABNORMAL HIGH (ref 0.00–1.49)
Prothrombin Time: 24.2 seconds — ABNORMAL HIGH (ref 11.6–15.2)

## 2014-08-23 ENCOUNTER — Other Ambulatory Visit (HOSPITAL_COMMUNITY)
Admission: AD | Admit: 2014-08-23 | Discharge: 2014-08-23 | Disposition: A | Discharge status: Home / Self Care | Payer: Medicare Other | Source: Skilled Nursing Facility | Attending: Internal Medicine | Admitting: Internal Medicine

## 2014-08-23 DIAGNOSIS — Z7901 Long term (current) use of anticoagulants: Secondary | ICD-10-CM | POA: Diagnosis present

## 2014-08-23 LAB — PROTIME-INR
INR: 2.5 — ABNORMAL HIGH (ref 0.00–1.49)
PROTHROMBIN TIME: 27.2 s — AB (ref 11.6–15.2)

## 2014-08-27 ENCOUNTER — Encounter (HOSPITAL_COMMUNITY)
Admission: AD | Admit: 2014-08-27 | Discharge: 2014-08-27 | Disposition: A | Discharge status: Home / Self Care | Payer: Medicare Other | Source: Skilled Nursing Facility | Attending: Internal Medicine | Admitting: Internal Medicine

## 2014-08-27 DIAGNOSIS — R69 Illness, unspecified: Secondary | ICD-10-CM | POA: Insufficient documentation

## 2014-08-27 DIAGNOSIS — I82409 Acute embolism and thrombosis of unspecified deep veins of unspecified lower extremity: Secondary | ICD-10-CM | POA: Insufficient documentation

## 2014-08-27 LAB — PROTIME-INR
INR: 2.48 — ABNORMAL HIGH (ref 0.00–1.49)
Prothrombin Time: 27 seconds — ABNORMAL HIGH (ref 11.6–15.2)

## 2014-09-06 ENCOUNTER — Other Ambulatory Visit (HOSPITAL_COMMUNITY)
Admission: AD | Admit: 2014-09-06 | Discharge: 2014-09-06 | Disposition: A | Discharge status: Home / Self Care | Payer: Medicare Other | Source: Skilled Nursing Facility | Attending: Internal Medicine | Admitting: Internal Medicine

## 2014-09-06 DIAGNOSIS — R69 Illness, unspecified: Secondary | ICD-10-CM | POA: Insufficient documentation

## 2014-09-06 DIAGNOSIS — Z7901 Long term (current) use of anticoagulants: Secondary | ICD-10-CM | POA: Diagnosis not present

## 2014-09-06 LAB — PROTIME-INR
INR: 2.05 — ABNORMAL HIGH (ref 0.00–1.49)
Prothrombin Time: 23.3 seconds — ABNORMAL HIGH (ref 11.6–15.2)

## 2014-09-10 ENCOUNTER — Non-Acute Institutional Stay (SKILLED_NURSING_FACILITY): Payer: Medicare Other | Admitting: Internal Medicine

## 2014-09-10 DIAGNOSIS — I82409 Acute embolism and thrombosis of unspecified deep veins of unspecified lower extremity: Secondary | ICD-10-CM | POA: Diagnosis not present

## 2014-09-10 DIAGNOSIS — I10 Essential (primary) hypertension: Secondary | ICD-10-CM

## 2014-09-10 DIAGNOSIS — I2782 Chronic pulmonary embolism: Secondary | ICD-10-CM

## 2014-09-10 DIAGNOSIS — L03031 Cellulitis of right toe: Secondary | ICD-10-CM | POA: Diagnosis not present

## 2014-09-10 DIAGNOSIS — Z8673 Personal history of transient ischemic attack (TIA), and cerebral infarction without residual deficits: Secondary | ICD-10-CM | POA: Diagnosis not present

## 2014-09-10 NOTE — Progress Notes (Signed)
Patient ID: [name] [name], male   DOB: Aug 23, 1945, 69 y.o.   MRN: [id number]   This is a routine visit.  Level of care skilled.  Facility [facility].   Chief complaint-medical management of CVA late effect-hypertension-history DVT-history of pulmonary embolism--  .  History of present illness.  Patient is a pleasant elderly resident with the above diagnosis-he continues to be quite stable.  His weight is stable hovering around 205-210 this has been the case for several months .  His blood pressure appears to be relatively stable most recently 128/72 today-at times he will have somewhat variable systolics but has not really had consistent elevations in the past He continues on labetalol 200 mg a day as well as lisinopril 20 mg a day.  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  He also has a history of borderline hyperlipidemia he also has a history of fatty liver disease so this has been somewhat conservative treatment recent lipid panel showed cholesterol of 156 triglycerides 62 HDL 33 LDL 111.   Today he has no acute complaints denies any chest pain shortness of breath or discomfort --  -continues to ambulate in his wheelchair around the facility Nursing staff has noted a small open area on his right second toe   Family medical social history has been reviewed per history and physical on 11/05/2009   Medications have been reviewed per [facility]  .  Review of systems.  As stated in history of present illness.  In general no complaints of fever or chills  Skin does not complain of any pain--open area on right second toe noted .  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  Does not complaining of any sore throat.  Respiratory-denies any shortness of breath or  cough.  Cardiac-no complaints of chest pain.  GI-no complaints of nausea vomiting diarrhea constipation or abdominal discomfort.  Muscle skeletal-he does have some right-sided weakness status post CVA this is baseline and unchanged.--  Neurologic-- does not complain of headache or dizziness  t  Physical exam.   Thank you 97.3 pulse 73 respirations 18 blood pressure 128/72 weight is stable at 205 O2 sats rations 93% on room air  In general this is a pleasant elderly male in no distress ambulating in his wheelchair.  skin is warm and dry--- on her right second toe I do note on top there is approximately 1 cm in diameter open area that has an erythematous wound bed I do not see any drainage or bleeding possibly some mild tenderness to palpation and possibly a small amount of surrounding erythema  e.  .  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  Oropharynx is clear mucous membranes moist.  Chest-does have a minimal amount of rhonchi on expiration-no labored breathing this is baseline.  Heart is regular rate and rhythm without murmur gallop or rub he has trace lower extremity edema with obese changes.  Abdomen obese soft nontender with positive bowel sounds.  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  Labs  09/06/2014.  INR 2.05.  08/23/2014.  INR 2.5.  02/10/206  Sodium 140 potassium 3.8 BUN 16 creatinine 1.15.  Albumin 3.3 otherwise liver function tests within normal limits.  WBC 8.5 hemoglobin 14.7 platelets 280.    Jan 23rd 2016.  INR-2.02.  03/21/2014.  WBC 11.0 hemoglobin 15.1 platelets  280.  Sodium 140 potassium 4.4 BUN 20 creatinine 1.22.  Liver function tests within normal limits except albumin of 3.1.     03/20/2014.  INR 2.69     11/21/2013.  INR-2.42  10/17/2013.  Cholesterol 162 triglycerides 61 HDL 34 LDL 116.  White blood cells 6.8 hemoglobin 15.2 platelets 275.  Sodium 141 potassium 4.1 BUN 16 creatinine 1.16.  Liver function tests within normal limits except albumin of 3.2   10/08/2013.  INR 2.49.  06/25/2013.  WBC 7.0 hemoglobin 15.0 platelets 273.  Sodium 141 potassium 4.1 BUN 16 creatinine 1.17.  Liver function tests within normal limits except albumin of 3.2.   06/20/2013.  INR-2.54.  04/10/2013.  INR-2.42.  03/14/2013.  Cholesterol 161 triglycerides 79 HDL 34 LDL 111.  Liver function tests within normal limits except albumin of 3.2.  02/28/2013.  WBC 6.9 hemoglobin 15.4 platelets 254.  Sodium 140 potassium 3.8 BUN 15 creatinine 1.04.  Liver function tests within normal limits except albumin of 3.1  .  12/13/2012.  INR-2.58.  09/27/2012.  WBC 7.5 hemoglobin 14.9 platelets 252.  Sodium 139 potassium 4.3 BUN 14 creatinine 1.33  09/18/2012.  Cholesterol 151 triglycerides 71 LDL 105 HDL 32.  09/18/2012.  INR-2.11.  Liver function tests within normal limits except albumin of 3.0.  05/17/2012.  The CBC 6.2 hemoglobin 15.5 platelets 271.  Sodium 140 potassium 4 BUN 24 creatinine 1.2  .  Assessment and plan.   Number -1-- CVA --this is a baseline--he continues on Coumadin for anticoagulation-INR is therapeutic this has been quite stable for some time will update this this week   #2-hypertension- continues on labetalol and lisinopril-this point appears stable -   #3 history DVT-again continues on anticoagulation as noted above--will update an INR this week   #4-history of pneumonia-this is a stable he is on routine nebulizers   #5-constipation-this appears stable current medications.  He is on lactulose.     #6-hyperlipidemia-again conservative here secondary to history of fatty liver disease-LDL is minimally elevated we'll continue to monitor as needed but at this point we'll not be aggressive --    #7-right toe wound-he is receiving a topical antibiotic-this will need monitoring by nursing-will start doxycycline 100 mg twice a day for 7 days and have this monitored closely  [id number]     Patient ID: [name] [name], male   DOB: October 11, 1945, 69 y.o.   MRN: [id number]

## 2014-09-13 ENCOUNTER — Other Ambulatory Visit (HOSPITAL_COMMUNITY)
Admission: AD | Admit: 2014-09-13 | Discharge: 2014-09-13 | Disposition: A | Discharge status: Home / Self Care | Payer: Medicare Other | Source: Skilled Nursing Facility | Attending: Internal Medicine | Admitting: Internal Medicine

## 2014-09-13 DIAGNOSIS — Z7901 Long term (current) use of anticoagulants: Secondary | ICD-10-CM | POA: Insufficient documentation

## 2014-09-13 LAB — PROTIME-INR
INR: 2.17 — ABNORMAL HIGH (ref 0.00–1.49)
Prothrombin Time: 24.4 seconds — ABNORMAL HIGH (ref 11.6–15.2)

## 2014-09-16 ENCOUNTER — Encounter (HOSPITAL_COMMUNITY)
Admission: RE | Admit: 2014-09-16 | Discharge: 2014-09-16 | Disposition: A | Discharge status: Home / Self Care | Payer: Medicare Other | Source: Skilled Nursing Facility | Attending: Internal Medicine | Admitting: Internal Medicine

## 2014-09-16 DIAGNOSIS — R69 Illness, unspecified: Secondary | ICD-10-CM | POA: Diagnosis not present

## 2014-09-16 LAB — PROTIME-INR
INR: 2.07 — ABNORMAL HIGH (ref 0.00–1.49)
Prothrombin Time: 23.4 seconds — ABNORMAL HIGH (ref 11.6–15.2)

## 2014-09-23 ENCOUNTER — Encounter (HOSPITAL_COMMUNITY)
Admission: RE | Admit: 2014-09-23 | Discharge: 2014-09-23 | Disposition: A | Discharge status: Home / Self Care | Payer: Medicare Other | Source: Skilled Nursing Facility | Attending: Internal Medicine | Admitting: Internal Medicine

## 2014-09-23 DIAGNOSIS — Z79899 Other long term (current) drug therapy: Secondary | ICD-10-CM | POA: Insufficient documentation

## 2014-09-23 DIAGNOSIS — Z7901 Long term (current) use of anticoagulants: Secondary | ICD-10-CM | POA: Diagnosis present

## 2014-09-23 LAB — PROTIME-INR
INR: 2.6 — ABNORMAL HIGH (ref 0.00–1.49)
PROTHROMBIN TIME: 28 s — AB (ref 11.6–15.2)

## 2014-10-07 ENCOUNTER — Encounter (HOSPITAL_COMMUNITY)
Admission: RE | Admit: 2014-10-07 | Discharge: 2014-10-07 | Disposition: A | Discharge status: Home / Self Care | Payer: Medicare Other | Source: Skilled Nursing Facility | Attending: Internal Medicine | Admitting: Internal Medicine

## 2014-10-07 DIAGNOSIS — Z7901 Long term (current) use of anticoagulants: Secondary | ICD-10-CM | POA: Diagnosis not present

## 2014-10-07 LAB — PROTIME-INR
INR: 2.17 — ABNORMAL HIGH (ref 0.00–1.49)
PROTHROMBIN TIME: 24.4 s — AB (ref 11.6–15.2)

## 2014-10-08 ENCOUNTER — Non-Acute Institutional Stay (SKILLED_NURSING_FACILITY): Payer: Medicare Other | Admitting: Internal Medicine

## 2014-10-08 DIAGNOSIS — Z7901 Long term (current) use of anticoagulants: Secondary | ICD-10-CM

## 2014-10-08 DIAGNOSIS — I82409 Acute embolism and thrombosis of unspecified deep veins of unspecified lower extremity: Secondary | ICD-10-CM | POA: Diagnosis not present

## 2014-10-08 DIAGNOSIS — I10 Essential (primary) hypertension: Secondary | ICD-10-CM

## 2014-10-08 DIAGNOSIS — I639 Cerebral infarction, unspecified: Secondary | ICD-10-CM

## 2014-10-08 NOTE — Progress Notes (Signed)
Patient ID: ZAKARIAH URWIN, male   DOB: [birth year], 69 y.o.   MRN: [id number]      This is a routine visit.  Level of care skilled.  Facility [facility].   Chief complaint-medical management of CVA late effect-hypertension-history DVT-history of pulmonary embolism--  .  History of present illness.  Patient is a pleasant elderly resident with the above diagnosis-he continues to be quite stable.  His weight is stable hovering around 205-210 this has been the case for several months .  His blood pressure appears to be relatively stable most recently 138/80 today-at times he will have somewhat variable systolics but has not really had consistent elevations in the past He continues on labetalol 200 mg a day as well as lisinopril 20 mg a day.  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  He also has a history of borderline hyperlipidemia he also has a history of fatty liver disease so this has been somewhat conservative treatment recent lipid panel showed cholesterol of 156 triglycerides 62 HDL 33 LDL 111.   Today he has no acute complaints denies any chest pain shortness of breath or discomfort --  -continues to ambulate in his wheelchair around the facility   Family medical social history has been reviewed per history and physical on 11/05/2009   Medications have been reviewed per [facility]  .  Review of systems.  As stated in history of present illness.  In general no complaints of fever or chills  Skin does not complain of any pain--has a small open area on his right second toe that has been followed by nursing this appears to be stable .  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  Does not complaining of any sore throat.  Respiratory-denies any shortness of breath or cough.    Cardiac-no complaints of chest pain.  GI-no complaints of nausea vomiting diarrhea constipation or abdominal discomfort.  Muscle skeletal-he does have some right-sided weakness status post CVA this is baseline and unchanged.--  Neurologic-- does not complain of headache or dizziness  t  Physical exam.    Tincher 98.1 pulse 76 respirations 20 blood pressure 138/80 appears ranges 149/87 2 systolic in the 120s baseline appears to be more the 130s systolically and the 80s diastolically-most recent weight 205 which is stable  In general this is a pleasant elderly male in no distress ambulating in his wheelchair.  skin is warm and dry--- on his right second toe there is a small open area there is no surrounding erythema drainage or sign of infection   .  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  Oropharynx is clear mucous membranes moist.  Chest--Lshallowair entry but I could not really appreciate any congestion this evening-no labored breathing this is baseline.  Heart is regular rate and rhythm without murmur gallop or rub he has trace lower extremity edema wi.  Abdomen obese soft nontender with positive bowel sounds.  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  Neurologic-history  of nerve palsy on the left-again status post CVA some right-sided weakness which is baseline  .  Labs  10/07/2014.  INR 2.17.  07/31/2014.  Sodium 140 potassium 3.8 BUN 16 creatinine 1.15-liver function tests within normal limits except albumin of 3.3.  WBC 8.5 hemoglobin 14.7 platelets 280.    09/06/2014.  INR 2.05.  08/23/2014.  INR 2.5.  02/10/206  Sodium 140 potassium 3.8 BUN 16 creatinine 1.15.  Albumin 3.3 otherwise liver  function tests within normal limits.  WBC 8.5 hemoglobin 14.7 platelets 280.    Jan 23rd 2016.  INR-2.02.  03/21/2014.  WBC 11.0 hemoglobin 15.1 platelets 280.  Sodium 140 potassium 4.4 BUN 20 creatinine 1.22.  Liver function tests within normal limits except albumin of 3.1.     03/20/2014.  INR 2.69     11/21/2013.  INR-2.42  10/17/2013.  Cholesterol 162 triglycerides 61 HDL 34 LDL 116.  White blood cells 6.8 hemoglobin 15.2 platelets 275.  Sodium 141 potassium 4.1 BUN 16 creatinine 1.16.  Liver function tests within normal limits except albumin of 3.2   10/08/2013.  INR 2.49.  06/25/2013.  WBC 7.0 hemoglobin 15.0 platelets 273.  Sodium 141 potassium 4.1 BUN 16 creatinine 1.17.  Liver function tests within normal limits except albumin of 3.2.   06/20/2013.  INR-2.54.  04/10/2013.  INR-2.42.  03/14/2013.  Cholesterol 161 triglycerides 79 HDL 34 LDL 111.  Liver function tests within normal limits except albumin of 3.2.  02/28/2013.  WBC 6.9 hemoglobin 15.4 platelets 254.  Sodium 140 potassium 3.8 BUN 15 creatinine 1.04.  Liver function tests within normal limits except albumin of 3.1  .  12/13/2012.  INR-2.58.  09/27/2012.  WBC 7.5 hemoglobin 14.9 platelets 252.  Sodium 139 potassium 4.3 BUN 14 creatinine 1.33  09/18/2012.  Cholesterol 151 triglycerides 71 LDL 105 HDL 32.  09/18/2012.  INR-2.11.  Liver function tests within normal limits except albumin of 3.0.  05/17/2012.  The CBC 6.2 hemoglobin 15.5 platelets 271.  Sodium 140 potassium 4 BUN 24 creatinine 1.2  .  Assessment and plan.   Number -1-- CVA --this is a baseline--he continues on Coumadin for anticoagulation-INR is therapeutic this has been quite stable for some time --update is pending   #2-hypertension- continues on labetalol and lisinopril-this point appears stable -- variable systolics but I do not see consistent elevations -   #3 history DVT-again continues on anticoagulation  as noted above--update INR is pending   #4-history of pneumonia-this is a stable he is on routine nebulizers   #5-constipation-this appears stable current medications.  He is on lactulose.   #6-hyperlipidemia-again conservative here secondary to history of fatty liver disease-LDL is minimally elevated we'll continue to monitor and update this Day-    #7-open area right second toe-this appears to be fairly stable-wound care is following this   Will update a CBC and metabolic panel as well as a lipid panel  [phone]     Patient ID: [place] T Lacross, male   DOB: 09/12/1945, 69 y.o.   MRN: [id number]

## 2014-10-09 ENCOUNTER — Encounter (HOSPITAL_COMMUNITY)
Admission: RE | Admit: 2014-10-09 | Discharge: 2014-10-09 | Disposition: A | Discharge status: Home / Self Care | Payer: Medicare Other | Source: Skilled Nursing Facility | Attending: Internal Medicine | Admitting: Internal Medicine

## 2014-10-09 DIAGNOSIS — Z7901 Long term (current) use of anticoagulants: Secondary | ICD-10-CM | POA: Diagnosis not present

## 2014-10-09 LAB — BASIC METABOLIC PANEL
ANION GAP: 5 (ref 5–15)
BUN: 17 mg/dL (ref 6–23)
CALCIUM: 8.7 mg/dL (ref 8.4–10.5)
CO2: 30 mmol/L (ref 19–32)
CREATININE: 1.1 mg/dL (ref 0.50–1.35)
Chloride: 104 mmol/L (ref 96–112)
GFR, EST AFRICAN AMERICAN: 78 mL/min — AB (ref >90)
GFR, EST NON AFRICAN AMERICAN: 67 mL/min — AB (ref >90)
Glucose, Bld: 74 mg/dL (ref 70–99)
Potassium: 4.2 mmol/L (ref 3.5–5.1)
Sodium: 139 mmol/L (ref 135–145)

## 2014-10-09 LAB — LIPID PANEL
CHOL/HDL RATIO: 4.6 ratio
Cholesterol: 156 mg/dL (ref 0–200)
HDL: 34 mg/dL — ABNORMAL LOW (ref >39)
LDL CALC: 110 mg/dL — AB (ref 0–99)
Triglycerides: 61 mg/dL (ref <150)
VLDL: 12 mg/dL (ref 0–40)

## 2014-10-09 LAB — CBC WITH DIFFERENTIAL/PLATELET
BASOS ABS: 0 10*3/uL (ref 0.0–0.1)
Basophils Relative: 0 % (ref 0–1)
Eosinophils Absolute: 0.4 10*3/uL (ref 0.0–0.7)
Eosinophils Relative: 5 % (ref 0–5)
HCT: 46.1 % (ref 39.0–52.0)
Hemoglobin: 15.2 g/dL (ref 13.0–17.0)
Lymphocytes Relative: 32 % (ref 12–46)
Lymphs Abs: 2.5 10*3/uL (ref 0.7–4.0)
MCH: 31.2 pg (ref 26.0–34.0)
MCHC: 33 g/dL (ref 30.0–36.0)
MCV: 94.7 fL (ref 78.0–100.0)
Monocytes Absolute: 0.5 10*3/uL (ref 0.1–1.0)
Monocytes Relative: 7 % (ref 3–12)
NEUTROS PCT: 56 % (ref 43–77)
Neutro Abs: 4.3 10*3/uL (ref 1.7–7.7)
Platelets: 229 10*3/uL (ref 150–400)
RBC: 4.87 MIL/uL (ref 4.22–5.81)
RDW: 13.5 % (ref 11.5–15.5)
WBC: 7.7 10*3/uL (ref 4.0–10.5)

## 2014-11-06 ENCOUNTER — Encounter (HOSPITAL_COMMUNITY)
Admission: AD | Admit: 2014-11-06 | Discharge: 2014-11-06 | Disposition: A | Discharge status: Home / Self Care | Payer: Medicare Other | Source: Skilled Nursing Facility | Attending: Internal Medicine | Admitting: Internal Medicine

## 2014-11-06 DIAGNOSIS — I10 Essential (primary) hypertension: Secondary | ICD-10-CM | POA: Insufficient documentation

## 2014-11-06 DIAGNOSIS — Z7901 Long term (current) use of anticoagulants: Secondary | ICD-10-CM | POA: Diagnosis not present

## 2014-11-06 LAB — PROTIME-INR
INR: 2.37 — ABNORMAL HIGH (ref 0.00–1.49)
Prothrombin Time: 26.1 seconds — ABNORMAL HIGH (ref 11.6–15.2)

## 2014-11-15 ENCOUNTER — Non-Acute Institutional Stay (SKILLED_NURSING_FACILITY): Payer: Medicare Other | Admitting: Internal Medicine

## 2014-11-15 ENCOUNTER — Other Ambulatory Visit (HOSPITAL_COMMUNITY)
Admission: AD | Admit: 2014-11-15 | Discharge: 2014-11-15 | Disposition: A | Discharge status: Home / Self Care | Payer: Medicare Other | Source: Skilled Nursing Facility | Attending: Internal Medicine | Admitting: Internal Medicine

## 2014-11-15 DIAGNOSIS — I10 Essential (primary) hypertension: Secondary | ICD-10-CM

## 2014-11-15 DIAGNOSIS — I639 Cerebral infarction, unspecified: Secondary | ICD-10-CM | POA: Diagnosis not present

## 2014-11-15 DIAGNOSIS — I2782 Chronic pulmonary embolism: Secondary | ICD-10-CM

## 2014-11-15 DIAGNOSIS — I82409 Acute embolism and thrombosis of unspecified deep veins of unspecified lower extremity: Secondary | ICD-10-CM | POA: Diagnosis not present

## 2014-11-15 DIAGNOSIS — L03031 Cellulitis of right toe: Secondary | ICD-10-CM

## 2014-11-15 NOTE — Progress Notes (Signed)
Patient ID: [name] [name], male   DOB: 1945/10/19, 69 y.o.   MRN: [id number]      This is a routine visit.  Level of care skilled.  Facility [facility].   Chief complaint-medical management of CVA late effect-hypertension-history DVT-history of pulmonary embolism--  .  History of present illness.  Patient is a pleasant elderly resident with the above diagnosis-he continues to be quite stable.  His weight is stable -- recently 203.6 this has been the case for several months .  His blood pressure appears to be relatively stable most recently 128/77 today-at times he will have somewhat variable systolics but has not really had consistent elevations in the past He continues on labetalol 200 mg a day as well as lisinopril 20 mg a day.  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  He also has a history of borderline hyperlipidemia he also has a history of fatty liver disease so this has been somewhat conservative treatment recent lipid panel showed cholesterol of 156 triglycerides 61 HDL 34 LDL 110  Today he has no acute complaints denies any chest pain shortness of breath or discomfort --  -continues to ambulate in his wheelchair around the facility  Nursing staff has noted a small open area on his right second toe-this was previous treated at one point with doxycycline and appeared to improve-however this appears to have come back now with a small amount of drainage  He has been afebrile does not complain of any acute pain with this  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 :.  Review of systems.  As stated in history of present  illness.  In general no complaints of fever or chills  Skin does not complain of any pain-- Has reappearing open area right second toe.  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  Does not complaining of any sore throat.  Respiratory-denies any shortness of breath or cough.  Cardiac-no complaints of chest pain.  GI-no complaints of nausea vomiting diarrhea constipation or abdominal discomfort.  Muscle skeletal-he does have some right-sided weakness status post CVA this is baseline and unchanged.--  Neurologic-- does not complain of headache or dizziness  t  Physical exam.    Temperature 97.1 pulse 80 respirations 18 blood pressure 128/77 O2 saturation 96% on room air weight is stable at 203.6  In general this is a pleasant elderly male in no distress  skin is warm and dry--- on his right second toe there is an open area this appears to have enlarged-there is a scant amount of drainage some very mild tenderness to palpation of the area there is no surrounding erythema from the lesion .  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  Oropharynx is clear mucous membranes moist.  Chest--shallowair entry some initial respiratory rhonchi cleared largely with cough-no labored breathing this is baseline.  Heart is regular rate and rhythm without murmur gallop or rub he has trace lower extremity edema this is somewhat more pronounced on his right side which is not  new pedal pulse is palpable  Abdomen obese soft nontender with positive bowel sounds.  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    Labs  11/06/2014.  INR-2.37.  10/09/2014.  Cholesterol 156-triglycerides 61-HDL 34-LDL 110.  Sodium 139 potassium 4.2 BUN 17 creatinine 1.1.  WBC 7.7 hemoglobin 15.2 platelets 229.     10/07/2014.  INR 2.17.  07/31/2014.  Sodium 140 potassium 3.8 BUN 16 creatinine 1.15-liver function tests within normal limits except albumin of 3.3.  WBC 8.5 hemoglobin 14.7 platelets 280.    09/06/2014.  INR 2.05.  08/23/2014.  INR 2.5.  02/10/206  Sodium 140 potassium 3.8 BUN 16 creatinine 1.15.  Albumin 3.3 otherwise liver function tests within normal limits.  WBC 8.5 hemoglobin 14.7 platelets 280.    Jan 23rd 2016.  INR-2.02.  03/21/2014.  WBC 11.0 hemoglobin 15.1 platelets 280.  Sodium 140 potassium 4.4 BUN 20 creatinine 1.22.  Liver function tests within normal limits except albumin of 3.1.     03/20/2014.  INR 2.69       .  Assessment and plan.   Number -1-- CVA --this is a baseline--he continues on Coumadin for anticoagulation-INR is therapeutic this has been quite stable for some time --update is pending   #2-hypertension- continues on labetalol and lisinopril-this point appears stable -- variable systolics but I do not see consistent elevations--recent blood pressures 128/77-137/87 -   #3 history DVT-again continues on anticoagulation as noted above--update INR is pending   #4-history of pneumonia-this is a stable he is on routine nebulizers   #5-constipation-this appears stable current medications.  He is on lactulose.   #6-hyperlipidemia-again conservative here secondary to history of fatty liver disease-LDL is minimally elevated we'll continue to monitor --will update liver function tests     #7-open area right second toe-this appears to have regressed now has a small amount of drainage--will culture any drainage----will start doxycycline 100 mg twice a day for 10 days also topical treatment with antibiotics ointment and protect  with dressing     [id number]     Patient ID: [name] [name], male   DOB: [birth year], 69 y.o.   MRN: [id number]

## 2014-11-16 ENCOUNTER — Encounter (HOSPITAL_COMMUNITY)
Admission: RE | Admit: 2014-11-16 | Discharge: 2014-11-16 | Disposition: A | Discharge status: Home / Self Care | Payer: Medicare Other | Source: Skilled Nursing Facility | Attending: Internal Medicine | Admitting: Internal Medicine

## 2014-11-19 ENCOUNTER — Non-Acute Institutional Stay (SKILLED_NURSING_FACILITY): Payer: Medicare Other | Admitting: Internal Medicine

## 2014-11-19 ENCOUNTER — Other Ambulatory Visit (HOSPITAL_COMMUNITY)
Admission: AD | Admit: 2014-11-19 | Discharge: 2014-11-19 | Disposition: A | Discharge status: Home / Self Care | Payer: Medicare Other | Source: Skilled Nursing Facility | Attending: Internal Medicine | Admitting: Internal Medicine

## 2014-11-19 DIAGNOSIS — H538 Other visual disturbances: Secondary | ICD-10-CM

## 2014-11-19 DIAGNOSIS — L03031 Cellulitis of right toe: Secondary | ICD-10-CM | POA: Diagnosis not present

## 2014-11-19 DIAGNOSIS — I69998 Other sequelae following unspecified cerebrovascular disease: Secondary | ICD-10-CM | POA: Diagnosis present

## 2014-11-19 LAB — HEPATIC FUNCTION PANEL
ALT: 18 U/L (ref 17–63)
AST: 20 U/L (ref 15–41)
Albumin: 3.1 g/dL — ABNORMAL LOW (ref 3.5–5.0)
Alkaline Phosphatase: 44 U/L (ref 38–126)
BILIRUBIN DIRECT: 0.1 mg/dL (ref 0.1–0.5)
BILIRUBIN TOTAL: 0.7 mg/dL (ref 0.3–1.2)
Indirect Bilirubin: 0.6 mg/dL (ref 0.3–0.9)
Total Protein: 6.9 g/dL (ref 6.5–8.1)

## 2014-11-19 LAB — WOUND CULTURE: Gram Stain: NONE SEEN

## 2014-11-19 NOTE — Progress Notes (Signed)
Patient ID: [name] [name], male   DOB: [birth year], 69 y.o.   MRN [id number]      This is an acute visit.  Level of care skilled.  Facility [facility].   Chief complaint- Acute visit follow-up right second toe wound -some blurred vision right eye .  History of present illness.  Patient is a pleasant 69 year old male who has a history of a small open area on his right second toe-this was treated several months ago with doxycycline and appeared to have resolved however last week the area had opened up again and had a small amount of drainage-he was started empirically on doxycycline and antibiotic ointment.  The culture has returned showing moderate MRSA-as well as a few Pseudomonas.  He is afebrile  --still has some tenderness around the toe.  Toe itself appears relatively baseline with what I saw last week he still has an erythematous base in tenderness there is no surrounding erythema--. Continues to have a small amount of bloody drainage.  Patient also apparently complained to staff of some recent blurred vision-apparently this is been going on.  several days it has not worsened in that timeframe-he does not complain of any dizziness headache or changes in that respect.  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    -         Family medical social history has been reviewed per history and physical on 11/05/2009   Medications have been reviewed per [facility]  . :.  Review of systems.  As stated in history of present illness.  In general no complaints of fever or chills  Skin right second toe wound as noted above.  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  Does not complaining of any sore throat.  Respiratory-denies any shortness of breath or cough.  Cardiac-no complaints of chest pain.  GI-no  complaints of nausea vomiting diarrhea constipation or abdominal discomfort.  Muscle skeletal-he does have some right-sided weakness status post CVA this is baseline and unchanged.--  Neurologic-- does not complain of headache or dizziness  t  Physical exam.    He is afebrile--98.2 pulse of 80 respirations 20--114/81  In general this is a pleasant elderly male in no distress  skin is warm and dry--- on his right second toe there is an open area approximately a bit larger than 1 cm in diameter there is a small amount of bloody drainage and has an erythematous wound bed cannot really appreciate much surrounding erythema there is some tenderness to palpation of the area   .  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 Has prescription lenses-right eye pupil appears reactive to light sclerae and conjunctivae are clear-visual acuity appears grossly intact although he states he is seeing some blurriness at times  Oropharynx is clear mucous membranes moist.  Chest--shallowair entry some initial respiratory rhonchi cleared largely with cough-no labored breathing this is baseline.  Heart is regular rate and rhythm without murmur gallop or rub he has trace lower extremity edema this is somewhat more pronounced on his right side which is not new pedal pulse is palpable .  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  Labs    11/06/2014.  INR-2.37.  10/09/2014.  Cholesterol 156-triglycerides 61-HDL 34-LDL 110.  Sodium 139 potassium 4.2 BUN 17 creatinine 1.1.  WBC 7.7 hemoglobin 15.2  platelets 229.     10/07/2014.  INR 2.17.  07/31/2014.  Sodium 140 potassium 3.8 BUN 16 creatinine 1.15-liver function tests within normal limits except albumin of 3.3.  WBC 8.5 hemoglobin 14.7 platelets 280.    09/06/2014.  INR 2.05.  08/23/2014.  INR 2.5.  02/10/206  Sodium 140 potassium 3.8 BUN 16 creatinine 1.15.  Albumin 3.3 otherwise liver function tests within normal limits.  WBC 8.5 hemoglobin 14.7 platelets 280.    Jan 23rd 2016.  INR-2.02.  03/21/2014.  WBC 11.0 hemoglobin 15.1 platelets 280.  Sodium 140 potassium 4.4 BUN 20 creatinine 1.22.  Liver function tests within normal limits except albumin of 3.1.     03/20/2014.  INR 2.69       .  Assessment and plan  #1-right second toe wound-at this point will continue the doxycycline as well as topical treatment-he does not really appear to be improving or regressing from previous exam-this will be followed by Dr. Leanord Hawkingobson tomorrow-clinically he appears stable he is afebrile but this will have to be watched carefully again with follow-up by Dr. Leanord Hawkingobson tomorrow--this was discussed with Dr. Leanord Hawkingobson via phone.  He is being treated again with the doxycycline for MRSA   #2 right eye blurrines. --we are arranging follow-up with optometrist as well as ophthalmology-clinically appears stable in this regards apparently this has been somewhat of a gradual situation   [id number]      Patient ID: [name] [name], male   DOB: [birth year], 69 y.o.   MRN: [id number]

## 2014-11-20 ENCOUNTER — Non-Acute Institutional Stay (SKILLED_NURSING_FACILITY): Payer: Medicare Other | Admitting: Internal Medicine

## 2014-11-20 DIAGNOSIS — I739 Peripheral vascular disease, unspecified: Secondary | ICD-10-CM

## 2014-11-20 DIAGNOSIS — L03031 Cellulitis of right toe: Secondary | ICD-10-CM

## 2014-11-20 NOTE — Progress Notes (Signed)
Patient ID: [name] [name], [name] [name]   DOB: Jul 30, 1945, 69 y.o.   MRN: [id number] Facility: [facility] CC: Wound over the Rt. Second toe History: apparently the patient has had a wound on his right second toe may be dating back a month however this healed over only to open again 2 weeks ago. This is been reviewed by our service a culture of this wound grew both Pseudomonas [quinolone resistant] and MRSA [sulfa resistant]. The patient has not been systemically unwell area and he is not a diabetic. I cannot see previous vascular studies. Although he is very physically challenged he does use that foot to push himself around in the wheelchair also using his arm to pull himself along on the rails. Complicating any biotic choices is the fact that he is on Coumadin  Past Medical History  Diagnosis Date  . Stroke   . Fatty liver   . Pneumonia   . Dysphasia   . Pulmonary embolism   . Hypertension    Medications  Guaifenesin when necessary June abscess twice a day and when necessary Labetalol 200 mg twice a day Lactulose 15 ML's twice a day Senokot as at bedtime Lisinopril 20 daily Coumadin 3 mg daily    Physical examination Respiratory clear entry bilaterally Cardiac no signs of congestive heart failure Extremities; I cannot feel pulses below the femoral. Even the femoral was hardly feel although he was up in the chair. Right foot. The there is an ulcer over the PIP of the right second toe. The second toe is swollen and very painful. I'm concerned that there could be underlying infection and the PIP joint here compatible with septic arthritis. There is discoloration over the proximal foot perhaps some cellulitis here as well  Impression/plan #1 wound over the right second toe dorsal aspect of his PIP joint. Culture of this has grown MRSA and pseudomonas. I'm going to give him linezolid and an advanced generation oral Cephalosporium. IV anti-biotics may be necessary #2 likely significant PAD.  He  will need vascular studies and an x-ray. I am concerned that the infection may be outside of the toe and may involve the joint itself. . This Pseudomonas was sensitive to [place]. There is no evidence of renal insufficiency. Careful attention to PT/INR

## 2014-11-21 ENCOUNTER — Ambulatory Visit (HOSPITAL_COMMUNITY): Payer: Medicare Other

## 2014-11-21 ENCOUNTER — Encounter (HOSPITAL_COMMUNITY)
Admission: AD | Admit: 2014-11-21 | Discharge: 2014-11-21 | Disposition: A | Discharge status: Home / Self Care | Payer: Medicare Other | Source: Skilled Nursing Facility | Attending: Internal Medicine | Admitting: Internal Medicine

## 2014-11-21 ENCOUNTER — Ambulatory Visit (HOSPITAL_COMMUNITY)
Admit: 2014-11-21 | Discharge: 2014-11-21 | Disposition: A | Discharge status: Home / Self Care | Payer: Medicare Other | Source: Ambulatory Visit | Attending: Internal Medicine | Admitting: Internal Medicine

## 2014-11-21 DIAGNOSIS — Z7901 Long term (current) use of anticoagulants: Secondary | ICD-10-CM | POA: Diagnosis present

## 2014-11-21 DIAGNOSIS — I745 Embolism and thrombosis of iliac artery: Secondary | ICD-10-CM | POA: Insufficient documentation

## 2014-11-21 DIAGNOSIS — I999 Unspecified disorder of circulatory system: Secondary | ICD-10-CM | POA: Diagnosis present

## 2014-11-21 DIAGNOSIS — I748 Embolism and thrombosis of other arteries: Secondary | ICD-10-CM | POA: Insufficient documentation

## 2014-11-21 LAB — PROTIME-INR
INR: 2.06 — AB (ref 0.00–1.49)
Prothrombin Time: 23.1 seconds — ABNORMAL HIGH (ref 11.6–15.2)

## 2014-11-25 ENCOUNTER — Non-Acute Institutional Stay (SKILLED_NURSING_FACILITY): Payer: Medicare Other | Admitting: Internal Medicine

## 2014-11-25 ENCOUNTER — Encounter (HOSPITAL_COMMUNITY)
Admission: RE | Admit: 2014-11-25 | Discharge: 2014-11-25 | Disposition: A | Discharge status: Home / Self Care | Payer: Medicare Other | Source: Skilled Nursing Facility | Attending: Internal Medicine | Admitting: Internal Medicine

## 2014-11-25 DIAGNOSIS — Z7901 Long term (current) use of anticoagulants: Secondary | ICD-10-CM

## 2014-11-25 DIAGNOSIS — I82409 Acute embolism and thrombosis of unspecified deep veins of unspecified lower extremity: Secondary | ICD-10-CM | POA: Diagnosis not present

## 2014-11-25 DIAGNOSIS — L03031 Cellulitis of right toe: Secondary | ICD-10-CM | POA: Diagnosis not present

## 2014-11-25 LAB — PROTIME-INR
INR: 2.88 — ABNORMAL HIGH (ref 0.00–1.49)
Prothrombin Time: 29.6 seconds — ABNORMAL HIGH (ref 11.6–15.2)

## 2014-11-25 NOTE — Progress Notes (Signed)
Patient ID: [name] [name], male   DOB: [birth year], 69 y.o.   MRN: [id number] This is an acute visit Facility: [facility]  Chief complaint acute visit follow-up anticoagulation management with history of pulmonary embolism DVT-on antibiotic secondary to right second toe wound   History: apparently the patient has had a wound on his right second toe may be dating back a month however this healed over only to open again recentlyo. a culture of this wound grew both Pseudomonas [quinolone resistant] and MRSA [sulfa resistant]  \This is been followed by Dr. Leanord [name] in per nursing appears to be slightly improved today-he is on Zyvox as well as a Cephalosporium.  He continues on Coumadin again with his history of DVT and pulmonary embolism-he is been usually quite stable-and INR today is 2.8 dated appears to be slowly rising he will have to keep an eye on that-there is been no increased bruising or bleeding.    Past Medical History  Diagnosis Date  . Stroke   . Fatty liver   . Pneumonia   . Dysphasia   . Pulmonary embolism   . Hypertension    Med[facility]  Review of systems.  In general does not any fever or chills.  Resp--no shortness of breath or cough.  Cardiac does not complain of chest pain.  Skin as noted in history of present illness there's been no increased bruising or bleeding  GI there's been no nausea vomiting or complaints of abdominal pain.  Muscle skeletal he does not really complain of joint pain or significant toe pain today this has been in issue at times.       Physical examination  Temperature 98.1 pulse 96 respirations 20 blood pressure 138/72 In general pleasant elderly male in no distress sitting comfortably in his wheelchair Respiratory clear entry bilaterally Cardiac no signs of congestive heart failure   Right foot. The there is an ulcer over the PIP of the right second toe. The ulcer appears to be a bit less inflamed appears to be  less tender to palpation than on previous exam-I do not see active drainage-ulcer might be a bit smaller than when I saw initially Neurologic  ntinues again with right-sided weakness status post CVA.  Psych he is alert and oriented pleasant and appropriate does usual  Labs-INR as noted above.-Today 2.88-was 2.06 on June 2  10/09/2014.  WBC 7.5 hemoglobin 15.2 platelets 229.  Sodium 139 potassium 4.2 BUN 17 creatinine 1.1  Impression/plan   #1-chronic anticoagulation history DVT and PE-INR is therapeutic but trending slightly up-this will have to be watched at this point continue current Coumadin dose and recheck in 2 days- #2wound over the right second toe dorsal aspect of his PIP joint. Culture of this has grown MRSA and pseudomonason  linezolid and an advanced generation oral Cephalosporin .   At this point appears stabilized but will have to be watched-Dr. Leanord [name] did order arterial Dopplers which appeared to show significant arterial disease-x-ray was negative for osteomyelitis.  We will await Dr. Jannetta [name]'s input-he may need a vascular consult   [phone]

## 2014-11-27 ENCOUNTER — Encounter (HOSPITAL_COMMUNITY)
Admission: AD | Admit: 2014-11-27 | Discharge: 2014-11-27 | Disposition: A | Discharge status: Home / Self Care | Payer: Medicare Other | Source: Skilled Nursing Facility | Attending: Internal Medicine | Admitting: Internal Medicine

## 2014-11-27 DIAGNOSIS — Z7901 Long term (current) use of anticoagulants: Secondary | ICD-10-CM | POA: Diagnosis not present

## 2014-11-27 LAB — PROTIME-INR
INR: 3.14 — ABNORMAL HIGH (ref 0.00–1.49)
Prothrombin Time: 31.7 seconds — ABNORMAL HIGH (ref 11.6–15.2)

## 2014-11-29 ENCOUNTER — Encounter (HOSPITAL_COMMUNITY)
Admission: AD | Admit: 2014-11-29 | Discharge: 2014-11-29 | Disposition: A | Discharge status: Home / Self Care | Payer: Medicare Other | Source: Skilled Nursing Facility | Attending: Internal Medicine | Admitting: Internal Medicine

## 2014-11-29 DIAGNOSIS — Z7901 Long term (current) use of anticoagulants: Secondary | ICD-10-CM | POA: Diagnosis not present

## 2014-11-29 LAB — PROTIME-INR
INR: 3.07 — AB (ref 0.00–1.49)
Prothrombin Time: 31.1 seconds — ABNORMAL HIGH (ref 11.6–15.2)

## 2014-12-02 DIAGNOSIS — Z7901 Long term (current) use of anticoagulants: Secondary | ICD-10-CM | POA: Diagnosis not present

## 2014-12-02 LAB — PROTIME-INR
INR: 2.78 — ABNORMAL HIGH (ref 0.00–1.49)
Prothrombin Time: 28.9 seconds — ABNORMAL HIGH (ref 11.6–15.2)

## 2014-12-03 ENCOUNTER — Non-Acute Institutional Stay (SKILLED_NURSING_FACILITY): Payer: Medicare Other | Admitting: Internal Medicine

## 2014-12-03 DIAGNOSIS — L299 Pruritus, unspecified: Secondary | ICD-10-CM

## 2014-12-04 ENCOUNTER — Non-Acute Institutional Stay (SKILLED_NURSING_FACILITY): Payer: Medicare Other | Admitting: Internal Medicine

## 2014-12-04 DIAGNOSIS — L03031 Cellulitis of right toe: Secondary | ICD-10-CM | POA: Diagnosis not present

## 2014-12-04 DIAGNOSIS — L97501 Non-pressure chronic ulcer of other part of unspecified foot limited to breakdown of skin: Secondary | ICD-10-CM | POA: Diagnosis not present

## 2014-12-04 DIAGNOSIS — Z7901 Long term (current) use of anticoagulants: Secondary | ICD-10-CM | POA: Diagnosis not present

## 2014-12-04 LAB — COMPREHENSIVE METABOLIC PANEL
ALT: 56 U/L (ref 17–63)
AST: 40 U/L (ref 15–41)
Albumin: 3.2 g/dL — ABNORMAL LOW (ref 3.5–5.0)
Alkaline Phosphatase: 42 U/L (ref 38–126)
Anion gap: 5 (ref 5–15)
BUN: 16 mg/dL (ref 6–20)
CALCIUM: 8.3 mg/dL — AB (ref 8.9–10.3)
CO2: 30 mmol/L (ref 22–32)
CREATININE: 1.1 mg/dL (ref 0.61–1.24)
Chloride: 103 mmol/L (ref 101–111)
GFR calc Af Amer: >60 mL/min (ref >60)
GFR calc non Af Amer: >60 mL/min (ref >60)
Glucose, Bld: 75 mg/dL (ref 65–99)
Potassium: 3.9 mmol/L (ref 3.5–5.1)
Sodium: 138 mmol/L (ref 135–145)
Total Bilirubin: 0.7 mg/dL (ref 0.3–1.2)
Total Protein: 6.7 g/dL (ref 6.5–8.1)

## 2014-12-04 NOTE — Progress Notes (Signed)
Patient ID: [name] [name], male   DOB: [birth year], 69 y.o.   MRN: [id number]     This is an acute visit.  The date is 12/03/2014.  Level care skilled.  Facility [facility].  Chief complaint acute visit follow-up itching.  History of present was.  Patient is a pleasant elderly resident to has been quite stable he is being followed for a right second toe wound completed anabiotic's in the wound apparently is doing better.  Patient has complained of some itching-more so his right arm.  Apparently this been going on for a couple weeks and apparently proceeded his anti-biotics.  He is receiving ciprofloxacin as well as a Linezolin and Cefdinir  He does have a listed history to penicillin but apparently is tolerating his anabiotic's well and again has finished them.  He does not complain of any shortness of breath throat tightening or respiratory distress.  Family medical social history as been reviewed most recently progress note 11/25/2014.  Medications have been reviewed per [facility].  Review of systems.  In general does not complain of any fever or chills back skin again does have some scratch marks on his right arm as well as a right second toe wound that apparently is looking better.  Head ears eyes nose mouth and throat does not complain of any throat tightening or tongue swelling.  Respiratory no complaints of shortness breath or cough.  Cardiac no chest pain.  Neurologic does not complain of dizziness headache.  Skin again is complaining of some itching appears more so his arms right greater than left.  Physical exam.  He is afebrile pulse of 80 respirations of 18.  In general this is a pleasant elderly male in no distress sitting comfortably in his wheelchair.  His skin is warm and dry he does have some excoriation marks on his lower right arm do not see any signs of infection however otherwise a do not see any rashes or abnormalities.  Wound at the distal  aspect of the right second toe appears to be improving toe is less tender I do not see any active drainage the wound appears to be smaller   Oropharynx is clear mucous membranes moist his tongue is not swollen.  Lips there is no edema.  Chest is clear to auscultation there is no labored breathing.  Heart is regular rate and rhythm without murmur gallop or rub.  Extremities I do not note any increased edema from baseline.  Labs.  12/02/2014-INR 2.78.  11/19/2014.  Liver function tests within normal limits except albumen of 3.1.  Peripheral 20th 2016.  Sodium 139 potassium 4.2 BUN 17 creatinine 1.1.  CBC 7.7 hemoglobin 15.2 platelets 229  Assessment and plan.  #1-pruritus itching-it appears he tolerated his antibiotic's well-he is now off them-at this point--will prescribe Benadryl when necessary for itching-I do not see any signs of cellulitis here.  Also he does have a previous history of fatty liver will check liver function tests to make sure they are stable and not contributing to this  If this progresses will have to reevaluate cannot rule out that possibly this is a residual effect of the antibiotic's will have to be monitored  [id number]

## 2014-12-05 DIAGNOSIS — Z7901 Long term (current) use of anticoagulants: Secondary | ICD-10-CM | POA: Diagnosis not present

## 2014-12-05 LAB — PROTIME-INR
INR: 1.78 — ABNORMAL HIGH (ref 0.00–1.49)
Prothrombin Time: 20.6 seconds — ABNORMAL HIGH (ref 11.6–15.2)

## 2014-12-09 ENCOUNTER — Encounter (HOSPITAL_COMMUNITY)
Admission: RE | Admit: 2014-12-09 | Discharge: 2014-12-09 | Disposition: A | Discharge status: Home / Self Care | Payer: Medicare Other | Source: Skilled Nursing Facility | Attending: Internal Medicine | Admitting: Internal Medicine

## 2014-12-09 DIAGNOSIS — Z7901 Long term (current) use of anticoagulants: Secondary | ICD-10-CM | POA: Diagnosis not present

## 2014-12-09 LAB — PROTIME-INR
INR: 1.45 (ref 0.00–1.49)
Prothrombin Time: 17.7 seconds — ABNORMAL HIGH (ref 11.6–15.2)

## 2014-12-13 DIAGNOSIS — Z7901 Long term (current) use of anticoagulants: Secondary | ICD-10-CM | POA: Diagnosis not present

## 2014-12-13 LAB — PROTIME-INR
INR: 1.34 (ref 0.00–1.49)
Prothrombin Time: 16.7 seconds — ABNORMAL HIGH (ref 11.6–15.2)

## 2014-12-15 NOTE — Progress Notes (Signed)
Patient ID: [name] [name], male   DOB: [birth year], 69 y.o.   MRN: [id number]    Facility: [facility] CC: Wound over the Rt. Second toe History: apparently the patient has had a wound on his right second toe may be dating back a month however this healed over only to open again 2 weeks ago. This is been reviewed by our service a culture of this wound grew both Pseudomonas [quinolone resistant] and MRSA [sulfa resistant]. The patient has not been systemically unwell area and he is not a diabetic. I cannot see previous vascular studies. Although he is very physically challenged he does use that foot to push himself around in the wheelchair also using his arm to pull himself along on the rails. Complicating any biotic choices is the fact that he is on Coumadin. We previoulsy gave him a combination of linezolid and an oral third generation cephalosporin. I am seeing th toe in follow up   Past Medical History  Diagnosis Date  . Stroke   . Fatty liver   . Pneumonia   . Dysphasia   . Pulmonary embolism   . Hypertension    Medications  Guaifenesin when necessary June abscess twice a day and when necessary Labetalol 200 mg twice a day Lactulose 15 ML's twice a day Senokot as at bedtime Lisinopril 20 daily Coumadin 3 mg daily    Physical examination Respiratory clear entry bilaterally Cardiac no signs of congestive heart failure Extremities; I cannot feel pulses below the femoral. Even the femoral was hardly feel although he was up in the chair. Right foot. The there is an ulcer over the PIP of the right second toe. This has an escar.. The degree of swelling and pian in the toe is much improved also the erythema.   Impression/plan #1 wound over the right second toe dorsal aspect of his PIP joint. Culture of this has grown MRSA and pseudomonas. The patient has completed antibiotics and the wound and toe are much improved. The wound will need debridement next week.  #2 PAD: his arterial  dopplers wee surprisingly good.   The toe looks much better. Will need a surface debridement next week.

## 2014-12-16 DIAGNOSIS — Z7901 Long term (current) use of anticoagulants: Secondary | ICD-10-CM | POA: Diagnosis not present

## 2014-12-16 LAB — PROTIME-INR
INR: 1.62 — AB (ref 0.00–1.49)
Prothrombin Time: 19.3 seconds — ABNORMAL HIGH (ref 11.6–15.2)

## 2014-12-23 ENCOUNTER — Encounter (HOSPITAL_COMMUNITY)
Admission: AD | Admit: 2014-12-23 | Discharge: 2014-12-23 | Disposition: A | Discharge status: Home / Self Care | Payer: Medicare Other | Source: Skilled Nursing Facility | Attending: Internal Medicine | Admitting: Internal Medicine

## 2014-12-23 DIAGNOSIS — Z7901 Long term (current) use of anticoagulants: Secondary | ICD-10-CM | POA: Insufficient documentation

## 2014-12-23 LAB — PROTIME-INR
INR: 2.33 — ABNORMAL HIGH (ref 0.00–1.49)
PROTHROMBIN TIME: 25.3 s — AB (ref 11.6–15.2)

## 2014-12-25 ENCOUNTER — Encounter (HOSPITAL_COMMUNITY)
Admission: RE | Admit: 2014-12-25 | Discharge: 2014-12-25 | Disposition: A | Discharge status: Home / Self Care | Payer: Medicare Other | Source: Skilled Nursing Facility | Attending: Internal Medicine | Admitting: Internal Medicine

## 2014-12-25 DIAGNOSIS — Z7901 Long term (current) use of anticoagulants: Secondary | ICD-10-CM | POA: Insufficient documentation

## 2014-12-25 LAB — PROTIME-INR
INR: 2.3 — AB (ref 0.00–1.49)
Prothrombin Time: 25.1 seconds — ABNORMAL HIGH (ref 11.6–15.2)

## 2014-12-30 DIAGNOSIS — Z7901 Long term (current) use of anticoagulants: Secondary | ICD-10-CM | POA: Diagnosis not present

## 2014-12-30 LAB — PROTIME-INR
INR: 2.55 — ABNORMAL HIGH (ref 0.00–1.49)
Prothrombin Time: 27.1 seconds — ABNORMAL HIGH (ref 11.6–15.2)

## 2015-01-02 DIAGNOSIS — Z7901 Long term (current) use of anticoagulants: Secondary | ICD-10-CM | POA: Diagnosis not present

## 2015-01-02 LAB — PROTIME-INR
INR: 2.63 — ABNORMAL HIGH (ref 0.00–1.49)
Prothrombin Time: 27.7 seconds — ABNORMAL HIGH (ref 11.6–15.2)

## 2015-01-03 DIAGNOSIS — Z7901 Long term (current) use of anticoagulants: Secondary | ICD-10-CM | POA: Diagnosis not present

## 2015-01-03 LAB — PROTIME-INR
INR: 2.56 — ABNORMAL HIGH (ref 0.00–1.49)
PROTHROMBIN TIME: 27.2 s — AB (ref 11.6–15.2)

## 2015-01-06 DIAGNOSIS — Z7901 Long term (current) use of anticoagulants: Secondary | ICD-10-CM | POA: Diagnosis not present

## 2015-01-06 LAB — PROTIME-INR
INR: 2.62 — ABNORMAL HIGH (ref 0.00–1.49)
PROTHROMBIN TIME: 27.7 s — AB (ref 11.6–15.2)

## 2015-01-13 DIAGNOSIS — Z7901 Long term (current) use of anticoagulants: Secondary | ICD-10-CM | POA: Diagnosis not present

## 2015-01-13 LAB — PROTIME-INR
INR: 3.09 — AB (ref 0.00–1.49)
Prothrombin Time: 31.3 seconds — ABNORMAL HIGH (ref 11.6–15.2)

## 2015-01-16 DIAGNOSIS — Z7901 Long term (current) use of anticoagulants: Secondary | ICD-10-CM | POA: Diagnosis not present

## 2015-01-16 LAB — PROTIME-INR
INR: 2.64 — ABNORMAL HIGH (ref 0.00–1.49)
PROTHROMBIN TIME: 27.8 s — AB (ref 11.6–15.2)

## 2015-01-20 ENCOUNTER — Encounter (HOSPITAL_COMMUNITY)
Admission: AD | Admit: 2015-01-20 | Discharge: 2015-01-20 | Disposition: A | Discharge status: Home / Self Care | Payer: Medicare Other | Source: Skilled Nursing Facility | Attending: Internal Medicine | Admitting: Internal Medicine

## 2015-01-20 DIAGNOSIS — Z7901 Long term (current) use of anticoagulants: Secondary | ICD-10-CM | POA: Insufficient documentation

## 2015-01-20 LAB — PROTIME-INR
INR: 2.37 — AB (ref 0.00–1.49)
Prothrombin Time: 25.6 seconds — ABNORMAL HIGH (ref 11.6–15.2)

## 2015-01-23 DIAGNOSIS — Z7901 Long term (current) use of anticoagulants: Secondary | ICD-10-CM | POA: Diagnosis not present

## 2015-01-23 LAB — PROTIME-INR
INR: 2.2 — AB (ref 0.00–1.49)
Prothrombin Time: 24.3 seconds — ABNORMAL HIGH (ref 11.6–15.2)

## 2015-01-30 DIAGNOSIS — Z7901 Long term (current) use of anticoagulants: Secondary | ICD-10-CM | POA: Diagnosis not present

## 2015-01-30 LAB — PROTIME-INR
INR: 2.07 — AB (ref 0.00–1.49)
Prothrombin Time: 23.2 seconds — ABNORMAL HIGH (ref 11.6–15.2)

## 2015-02-06 DIAGNOSIS — Z7901 Long term (current) use of anticoagulants: Secondary | ICD-10-CM | POA: Diagnosis not present

## 2015-02-06 LAB — PROTIME-INR
INR: 1.97 — ABNORMAL HIGH (ref 0.00–1.49)
Prothrombin Time: 22.3 seconds — ABNORMAL HIGH (ref 11.6–15.2)

## 2015-02-10 ENCOUNTER — Encounter (HOSPITAL_COMMUNITY)
Admission: RE | Admit: 2015-02-10 | Discharge: 2015-02-10 | Disposition: A | Discharge status: Home / Self Care | Payer: Medicare Other | Source: Skilled Nursing Facility | Attending: Internal Medicine | Admitting: Internal Medicine

## 2015-02-10 DIAGNOSIS — Z7901 Long term (current) use of anticoagulants: Secondary | ICD-10-CM | POA: Diagnosis not present

## 2015-02-10 LAB — PROTIME-INR
INR: 2.07 — ABNORMAL HIGH (ref 0.00–1.49)
Prothrombin Time: 23.2 seconds — ABNORMAL HIGH (ref 11.6–15.2)

## 2015-02-14 ENCOUNTER — Non-Acute Institutional Stay (SKILLED_NURSING_FACILITY): Payer: Medicare Other | Admitting: Internal Medicine

## 2015-02-14 DIAGNOSIS — Z7901 Long term (current) use of anticoagulants: Secondary | ICD-10-CM

## 2015-02-14 DIAGNOSIS — I639 Cerebral infarction, unspecified: Secondary | ICD-10-CM | POA: Diagnosis not present

## 2015-02-14 DIAGNOSIS — I10 Essential (primary) hypertension: Secondary | ICD-10-CM

## 2015-02-14 DIAGNOSIS — I2782 Chronic pulmonary embolism: Secondary | ICD-10-CM | POA: Diagnosis not present

## 2015-02-14 NOTE — Progress Notes (Signed)
Patient ID: [name] [name], male   DOB: 01-29-1946, 69 y.o.   MRN: [id number]       This is a routine visit.  Level of care skilled.  Facility [facility].   Chief complaint-medical management of CVA late effect-hypertension-history DVT-history of pulmonary embolism--  .  History of present illness.  Patient is a pleasant elderly resident with the above diagnosis-he continues to be quite stable.  His weight is stable -- recently 203.2 this has been the case for several months .  His blood pressure appears to be relatively stable most recently 127/87--132/85--times he will have somewhat variable systolics but has not really had consistent elevations in the past He continues on labetalol 200 mg a day as well as lisinopril 20 mg a day.  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  He also has a history of borderline hyperlipidemia he also has a history of fatty liver disease so this has been somewhat conservative treatment recent lipid panel showed cholesterol of 156 triglycerides 61 HDL 34 LDL 110  Today he has no acute complaints denies any chest pain shortness of breath or discomfort --  -continues to ambulate in his wheelchair around the facility  Patient was r treated aggressively for An wound on his right second toe-this was followed closely by Dr. Leanord Hawking and wound care-this has essentially resolved-he did have arterial Doppler studies done which  Looked stablerelatively  He has been afebrile does not complain of any acute pain with this  He does have a history CVA with right-sided hemiparesis    Family medical social history has been reviewed per history and physical on 11/05/2009   Medications have been reviewed per [facility]  . :.  Review of systems.  As stated in history of  present illness.  In general no complaints of fever or chills  Skin complaints of rash or itching-right toe issue as noted above   Head ears eyes nose mouth and throat-does have blindness of his left eye.  Does not complaining of any sore throat.  Respiratory-denies any shortness of breath or cough.  Cardiac-no complaints of chest pain.  GI-no complaints of nausea vomiting diarrhea constipation or abdominal discomfort.  Muscle skeletal-he does have some right-sided weakness status post CVA this is baseline and unchanged.--  Neurologic-- does not complain of headache or dizziness  t  Physical exam.     Temperature 97.7 pulse 82 respirations 16 blood pressure 127/87 weight is stable at 203.2   In general this is a pleasant elderly male in no distress  skin is warm and dry---  .  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  Oropharynx is clear mucous membranes moist.  Chest--shallowair entry some initial respiratory rhonchi cleared largely with cough-no labored breathing this is baseline.  Heart is regular rate and rhythm without murmur gallop or rub he has trace lower extremity edema this is somewhat more pronounced on his right side which is not new pedal pulse is palpable  Abdomen obese soft nontender with positive bowel sounds.  Muscle skeletal I did not note any deformities he does have right-sided weakness upper and lower extremities does have contracture of his right hand he does have some movement-  Neurologic-as stated  above that -- is able to ambulate in his wheelchair and this is his baseline.  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  Labs  02/10/2015.  INR 2.0 7  12/04/2014.  Sodium 138 potassium 3.9 BUN 16 creatinine 1.10 CO2 level XXX.  Albumin 3.2 otherwise liver function tests within normal limits.  WBC 7.7 hemoglobin 15.2 platelets 229    11/06/2014.  INR-2.37.  10/09/2014.  Cholesterol 156-triglycerides 61-HDL 34-LDL 110.  Sodium 139 potassium 4.2 BUN 17 creatinine 1.1.  WBC 7.7 hemoglobin 15.2 platelets 229.     10/07/2014.  INR 2.17.  07/31/2014.  Sodium 140 potassium 3.8 BUN 16 creatinine 1.15-liver function tests within normal limits except albumin of 3.3.  WBC 8.5 hemoglobin 14.7 platelets 280.    09/06/2014.  INR 2.05.  08/23/2014.  INR 2.5.  02/10/206  Sodium 140 potassium 3.8 BUN 16 creatinine 1.15.  Albumin 3.3 otherwise liver function tests within normal limits.  WBC 8.5 hemoglobin 14.7 platelets 280.    Jan 23rd 2016.  INR-2.02.  03/21/2014.  WBC 11.0 hemoglobin 15.1 platelets 280.  Sodium 140 potassium 4.4 BUN 20 creatinine 1.22.  Liver function tests within normal limits except albumin of 3.1.     03/20/2014.  INR 2.69       .  Assessment and plan.   Number -1-- CVA --this is a baseline--he continues on Coumadin for anticoagulation-INR is therapeutic this has been quite stable for some time --update is pending   #2-hypertension- continues on labetalol and lisinopril-this point appears stable -- variable systolics but I do not see consistent elevations--recent blood pressures 127/87-132/85  -   #3 history DVT-again continues on anticoagulation as noted above--update INR is pending   #4-history of pneumonia-this is a stable he is on routine nebulizers   #5-constipation-this appears stable current medications.  He is on lactulose.   #6-hyperlipidemia-again conservative here secondary to history of fatty liver disease-LDL is minimally elevated we'll continue to monitor --her function tests continued to be stable  Secondary to patient's anticoagulation use on Coumadin-and history of hypertension will update a CBC and BMP  [id number]

## 2015-02-22 ENCOUNTER — Encounter (HOSPITAL_COMMUNITY): Admit: 2015-02-22 | Payer: Self-pay

## 2015-02-24 ENCOUNTER — Encounter (HOSPITAL_COMMUNITY)
Admission: AD | Admit: 2015-02-24 | Discharge: 2015-02-24 | Disposition: A | Discharge status: Home / Self Care | Payer: Medicare Other | Source: Skilled Nursing Facility | Attending: Internal Medicine | Admitting: Internal Medicine

## 2015-02-24 DIAGNOSIS — Z7901 Long term (current) use of anticoagulants: Secondary | ICD-10-CM | POA: Diagnosis not present

## 2015-02-24 LAB — PROTIME-INR
INR: 1.78 — ABNORMAL HIGH (ref 0.00–1.49)
Prothrombin Time: 20.6 seconds — ABNORMAL HIGH (ref 11.6–15.2)

## 2015-02-24 LAB — CBC WITH DIFFERENTIAL/PLATELET
Basophils Absolute: 0 10*3/uL (ref 0.0–0.1)
Basophils Relative: 0 % (ref 0–1)
EOS PCT: 5 % (ref 0–5)
Eosinophils Absolute: 0.4 10*3/uL (ref 0.0–0.7)
HCT: 44.6 % (ref 39.0–52.0)
HEMOGLOBIN: 14.6 g/dL (ref 13.0–17.0)
LYMPHS ABS: 2.2 10*3/uL (ref 0.7–4.0)
LYMPHS PCT: 27 % (ref 12–46)
MCH: 30.7 pg (ref 26.0–34.0)
MCHC: 32.7 g/dL (ref 30.0–36.0)
MCV: 93.9 fL (ref 78.0–100.0)
MONOS PCT: 9 % (ref 3–12)
Monocytes Absolute: 0.7 10*3/uL (ref 0.1–1.0)
Neutro Abs: 4.9 10*3/uL (ref 1.7–7.7)
Neutrophils Relative %: 59 % (ref 43–77)
PLATELETS: 268 10*3/uL (ref 150–400)
RBC: 4.75 MIL/uL (ref 4.22–5.81)
RDW: 13.6 % (ref 11.5–15.5)
WBC: 8.2 10*3/uL (ref 4.0–10.5)

## 2015-02-24 LAB — BASIC METABOLIC PANEL
Anion gap: 3 — ABNORMAL LOW (ref 5–15)
BUN: 21 mg/dL — ABNORMAL HIGH (ref 6–20)
CHLORIDE: 104 mmol/L (ref 101–111)
CO2: 30 mmol/L (ref 22–32)
Calcium: 8.3 mg/dL — ABNORMAL LOW (ref 8.9–10.3)
Creatinine, Ser: 1.09 mg/dL (ref 0.61–1.24)
GFR calc Af Amer: >60 mL/min (ref >60)
GFR calc non Af Amer: >60 mL/min (ref >60)
Glucose, Bld: 79 mg/dL (ref 65–99)
POTASSIUM: 4 mmol/L (ref 3.5–5.1)
Sodium: 137 mmol/L (ref 135–145)

## 2015-03-03 ENCOUNTER — Encounter (HOSPITAL_COMMUNITY)
Admission: AD | Admit: 2015-03-03 | Discharge: 2015-03-03 | Disposition: A | Discharge status: Home / Self Care | Payer: Medicare Other | Source: Skilled Nursing Facility | Attending: Internal Medicine | Admitting: Internal Medicine

## 2015-03-03 DIAGNOSIS — Z7901 Long term (current) use of anticoagulants: Secondary | ICD-10-CM | POA: Diagnosis not present

## 2015-03-03 LAB — PROTIME-INR
INR: 1.84 — ABNORMAL HIGH (ref 0.00–1.49)
Prothrombin Time: 21.2 seconds — ABNORMAL HIGH (ref 11.6–15.2)

## 2015-03-04 ENCOUNTER — Inpatient Hospital Stay (HOSPITAL_COMMUNITY)
Admit: 2015-03-04 | Discharge: 2015-03-04 | Disposition: A | Discharge status: Home / Self Care | Payer: Medicare Other | Attending: Internal Medicine | Admitting: Internal Medicine

## 2015-03-06 ENCOUNTER — Ambulatory Visit (HOSPITAL_COMMUNITY)
Admission: RE | Admit: 2015-03-06 | Discharge: 2015-03-06 | Disposition: A | Discharge status: Home / Self Care | Payer: Medicare Other | Source: Ambulatory Visit | Attending: Internal Medicine | Admitting: Internal Medicine

## 2015-03-06 ENCOUNTER — Encounter (HOSPITAL_COMMUNITY)
Admission: AD | Admit: 2015-03-06 | Discharge: 2015-03-06 | Disposition: A | Discharge status: Home / Self Care | Payer: Medicare Other | Source: Skilled Nursing Facility | Attending: Internal Medicine | Admitting: Internal Medicine

## 2015-03-06 DIAGNOSIS — M7989 Other specified soft tissue disorders: Secondary | ICD-10-CM | POA: Insufficient documentation

## 2015-03-06 DIAGNOSIS — Z7901 Long term (current) use of anticoagulants: Secondary | ICD-10-CM | POA: Diagnosis not present

## 2015-03-06 DIAGNOSIS — M79601 Pain in right arm: Secondary | ICD-10-CM | POA: Insufficient documentation

## 2015-03-06 LAB — PROTIME-INR
INR: 2.01 — ABNORMAL HIGH (ref 0.00–1.49)
PROTHROMBIN TIME: 22.7 s — AB (ref 11.6–15.2)

## 2015-03-07 ENCOUNTER — Encounter (HOSPITAL_COMMUNITY)
Admission: RE | Admit: 2015-03-07 | Discharge: 2015-03-07 | Disposition: A | Discharge status: Home / Self Care | Payer: Medicare Other | Source: Skilled Nursing Facility | Attending: Internal Medicine | Admitting: Internal Medicine

## 2015-03-07 ENCOUNTER — Non-Acute Institutional Stay (SKILLED_NURSING_FACILITY): Payer: Medicare Other | Admitting: Internal Medicine

## 2015-03-07 DIAGNOSIS — I82409 Acute embolism and thrombosis of unspecified deep veins of unspecified lower extremity: Secondary | ICD-10-CM | POA: Diagnosis not present

## 2015-03-07 DIAGNOSIS — Z7901 Long term (current) use of anticoagulants: Secondary | ICD-10-CM

## 2015-03-07 DIAGNOSIS — R609 Edema, unspecified: Secondary | ICD-10-CM

## 2015-03-07 DIAGNOSIS — Z8673 Personal history of transient ischemic attack (TIA), and cerebral infarction without residual deficits: Secondary | ICD-10-CM

## 2015-03-07 DIAGNOSIS — I10 Essential (primary) hypertension: Secondary | ICD-10-CM | POA: Diagnosis not present

## 2015-03-07 LAB — PROTIME-INR
INR: 2.07 — AB (ref 0.00–1.49)
PROTHROMBIN TIME: 23.2 s — AB (ref 11.6–15.2)

## 2015-03-07 NOTE — Progress Notes (Signed)
Patient ID: [name] [name], male   DOB: 06/23/1945, 69 y.o.   MRN: [id number]        This is a routine-acute  visit.  Level of care skilled.  Facility [facility].   Chief complaint-medical management of CVA late effect-hypertension-history DVT-history of pulmonary embolism--   acute visit secondary to right arm edema cellulitis? .  History of present illness.  Patient is a pleasant elderly resident with the above diagnosis-  acute issue was some concern about right upper extremity edema cellulitis earlier this week-a venous Doppler was obtained which did not show any DVT.   he has been started on doxycycline empirically -.  He apparently is not really having any pain with this from what I can see today.   His vital signs are stable  .  His blood pressure appears to be relatively stable -- most recently 108/75- 144/94 I do not see consistent elevations have somewhat variable systolics but has not really had consistent elevations in the past He continues on labetalol 200 mg a day as well as lisinopril 20 mg a day.  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  He also has a history of borderline hyperlipidemia he also has a history of fatty liver disease so this has been somewhat conservative treatment recent lipid panel showed cholesterol of 156 triglycerides 61 HDL 34 LDL 110  Today he has no acute complaints denies any chest pain shortness of breath or discomfort --  -continues to ambulate in his wheelchair around the facility  Patientpreviouslyr treated aggressively for An wound on his right second toe-this was followed closely by Dr. 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  Doxycycline 100 mg twice a day until September 20.  DuoNeb nebs when necessary.  Labetalol 200 mg daily.  Lisinopril 20 mg daily.    . :.  Review of systems.  As stated in history of present illness.  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  Does not complaining of any sore throat.  Respiratory-denies any shortness of breath or cough.  Cardiac-no complaints of chest pain.  GI-no complaints of nausea vomiting diarrhea constipation or abdominal discomfort.  Muscle skeletal-he does have some right-sided weakness status post CVA this is baseline and unchanged.-- concerns about some right upper extremity edema erythema  Neurologic-- does not complain of headache or dizziness  t  Physical exam.       temperature 98.2 pulse 92 respirations 22 blood pressure 108/75 weight is 209.6 this appears to gain of about 5 pounds over the past month this is thought to be appetite related  In general this is a pleasant elderly male in no distress  skin is warm and dry--- did review his arm I did not se much erythema possibly a small amount mid right arm but this  is fairly minimal it is not really tender to palpation there is some edema here I suspect this might be dependent  .  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  Oropharynx is clear mucous membranes moist.  Chest--shallowair entry   some scattered rhonchi this this appears to be baseline -there is no labored breathing  Heart is regular rate and rhythm without murmur gallop or rub he has trace lower extremity edema this is somewhat more pronounced on his right side which is not new   Abdomen  obese soft nontender with positive bowel sounds.  Muscle skeletal I did not note any deformities he does have right-sided weakness upper and lower extremities does have contracture of his right hand he does have some movement- grip strength is baseline radial pulse intact  Neurologic-as stated above that -- is able to ambulate in his wheelchair and this is his baseline  he is able to move his right arm some grip strength appears to be intact and at baseline there is a positive radial pulse.  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  Labs   02/24/2015.  Sodium 137 potassium 4 BUN 21 creatinine 1.09.  WBC 8.2 hemoglobin 14.6 platelets 268.    02/10/2015.  INR 2.0 7  12/04/2014.  Sodium 138 potassium 3.9 BUN 16 creatinine 1.10 CO2 level XXX.  Albumin 3.2 otherwise liver function tests within normal limits.  WBC 7.7 hemoglobin 15.2 platelets 229   11/06/2014.  INR-2.37.  10/09/2014.  Cholesterol 156-triglycerides 61-HDL 34-LDL 110.  Sodium 139 potassium 4.2 BUN 17 creatinine 1.1.  WBC 7.7 hemoglobin 15.2 platelets 229.     10/07/2014.  INR 2.17.  07/31/2014.  Sodium 140 potassium 3.8 BUN 16 creatinine 1.15-liver function tests within normal limits except albumin of 3.3.  WBC 8.5 hemoglobin 14.7 platelets 280.    09/06/2014.  INR 2.05.  08/23/2014.  INR 2.5.  02/10/206  Sodium 140 potassium 3.8 BUN 16 creatinine 1.15.  Albumin 3.3 otherwise liver function tests within normal limits.  WBC 8.5 hemoglobin 14.7 platelets 280.    Jan 23rd 2016.  INR-2.02.  03/21/2014.  WBC 11.0 hemoglobin 15.1 platelets 280.  Sodium 140 potassium 4.4 BUN 20 creatinine 1.22.  Liver function tests within normal limits except albumin of 3.1.     03/20/2014.  INR 2.69       .  Assessment and plan.   Number -1-- CVA  --this is a baseline--he continues on Coumadin for anticoagulation-INR is therapeutic this has been quite stable for some time --update is pending   #2-hypertension- continues on labetalol and lisinopril-this point appears stable -- variable systolics but I do not see consistent elevations--  -   #3 history DVT-again continues on anticoagulation as noted above--update INR is pending   #4-history of pneumonia-this is a stable he is on routine nebulizers   #5-constipation-this appears stable current medications.  He is on lactulose.   #6-hyperlipidemia-again conservative here secondary to history of fatty liver disease-LDL is minimally elevated we'll continue to monitor --her function tests continue to be stable    #7 right arm cellulitis edema-at this point does not appear really remarkable he is finishing a course of doxycycline will complete this-we'll have to monitor his INR closely since he is on antibiotic - venous Doppler was negative for any clot x-ray of the arm also did not show  anything acute at this point monitor encourage arm elevation  [phone]

## 2015-03-10 ENCOUNTER — Encounter (HOSPITAL_COMMUNITY)
Admission: RE | Admit: 2015-03-10 | Discharge: 2015-03-10 | Disposition: A | Discharge status: Home / Self Care | Payer: Medicare Other | Source: Skilled Nursing Facility | Attending: Internal Medicine | Admitting: Internal Medicine

## 2015-03-10 DIAGNOSIS — Z7901 Long term (current) use of anticoagulants: Secondary | ICD-10-CM | POA: Diagnosis not present

## 2015-03-10 LAB — PROTIME-INR
INR: 2.26 — AB (ref 0.00–1.49)
PROTHROMBIN TIME: 24.8 s — AB (ref 11.6–15.2)

## 2015-03-20 ENCOUNTER — Ambulatory Visit (HOSPITAL_COMMUNITY)
Admission: RE | Admit: 2015-03-20 | Discharge: 2015-03-20 | Disposition: A | Discharge status: Home / Self Care | Payer: Medicare Other | Source: Ambulatory Visit | Attending: Internal Medicine | Admitting: Internal Medicine

## 2015-03-20 DIAGNOSIS — R05 Cough: Secondary | ICD-10-CM | POA: Diagnosis not present

## 2015-03-21 ENCOUNTER — Non-Acute Institutional Stay (SKILLED_NURSING_FACILITY): Payer: Medicare Other | Admitting: Internal Medicine

## 2015-03-21 DIAGNOSIS — J209 Acute bronchitis, unspecified: Secondary | ICD-10-CM | POA: Insufficient documentation

## 2015-03-21 DIAGNOSIS — R05 Cough: Secondary | ICD-10-CM | POA: Diagnosis not present

## 2015-03-21 DIAGNOSIS — R0989 Other specified symptoms and signs involving the circulatory and respiratory systems: Secondary | ICD-10-CM

## 2015-03-21 DIAGNOSIS — Z7901 Long term (current) use of anticoagulants: Secondary | ICD-10-CM

## 2015-03-21 DIAGNOSIS — R059 Cough, unspecified: Secondary | ICD-10-CM

## 2015-03-21 NOTE — Progress Notes (Signed)
Patient ID: [name] [name], male   DOB: July 24, 1945, 69 y.o.   MRN: [id number]         This is an-acute  visit.  Level of care skilled.  Facility [facility].   Chief complaint-  acute visit secondary to cough congestion  .  History of present illness.    patient is a pleasant elderly resident whose been quite stable recently he did recently comport a course of antibiotic for suspected right arm cellulitis which appears to have resolved unremarkably he does have some right-sided weakness status post CVA.  Also has a history COPD and pneumonia which has been quite stable for a considerable amount of time.  He is on duo nebs when necessary.  Apparently yesterday developed some increased cough and congestion and a chest  X-ray was obtained which did not show any acute process.  He was started on duo nebs routinely he does have when necessary Robitussin as well as following up on this.  Currently he denies any fever chills still has a cough productive apparently of some yellowish phlegm-does not complain of increased shortness breath is a ambulating about the facility in his wheelchair which is his baseline.  Vital signs are stable he is afebrile.         Family medical social history has been reviewed per history and physical on 11/05/2009   Medications have been reviewed per [facility]  this includes Coumadin 4 mg daily. .  DuoNeb nebs when necessary.  Labetalol 200 mg daily.  Lisinopril 20 mg daily   has DuoNeb's these have been made routine for short period of time.    . :.  Review of systems.  As stated in history of present illness.  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  Does not complaining of any sore throat.  Respiratory-denies any shortness of breath  -does have a cough with some increased chest congestion per nursing.  Cardiac-no complaints of  chest pain.  GI-no complaints of nausea vomiting diarrhea constipation or abdominal discomfort.  Muscle skeletal-he does have some right-sided weakness status post CVA this is baseline  Neurologic-- does not complain of headache or dizziness  t  Physical exam.      temperature is 97.6 pulse 78 respirations 20 blood pressure 142/60 O2 saturation 93% on room air  In general this is a pleasant elderly male in no distress  Sitting comfortably in his wheelchair skin is warm and dry---   erythema right arm  Appears to have resolved  .  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  Oropharynx is clear mucous membranes moist.  Chest--shallowair entry    continues with diffuse coarse breath sounds--more so on expiration this improved somewhat with cough-there is no labored breathing  Heart is regular rate and rhythm without murmur gallop or rub he has trace lower extremity edema this is somewhat more pronounced on his right side which is not new   Abdomen obese soft nontender with positive bowel sounds.  Muscle skeletal I did not note any deformities he does have right-sided weakness upper and lower extremities does have contracture of his right hand he does have some movement- grip strength is baseline radial pulse intact  Neurologic-as stated above that -- is able to ambulate in his wheelchair and this is his baseline   .  Neurologic-history of nerve  palsy on the left-again status post CVA some right-sided weakness which is baseline   psych he is alert and oriented pleasant and appropriate .  Labs  03/10/2015.  INR 2.26.      02/24/2015.  Sodium 137 potassium 4 BUN 21 creatinine 1.09.  WBC 8.2 hemoglobin 14.6 platelets 268.    02/10/2015.  INR 2.0 7  12/04/2014.  Sodium 138 potassium 3.9 BUN 16 creatinine 1.10 CO2 level XXX.  Albumin 3.2 otherwise liver function tests within normal limits.  WBC 7.7 hemoglobin 15.2 platelets 229    11/06/2014.  INR-2.37.  10/09/2014.  Cholesterol 156-triglycerides 61-HDL 34-LDL 110.  Sodium 139 potassium 4.2 BUN 17 creatinine 1.1.  WBC 7.7 hemoglobin 15.2 platelets 229.     10/07/2014.  INR 2.17.  07/31/2014.  Sodium 140 potassium 3.8 BUN 16 creatinine 1.15-liver function tests within normal limits except albumin of 3.3.  WBC 8.5 hemoglobin 14.7 platelets 280.    09/06/2014.  INR 2.05.  08/23/2014.  INR 2.5.  02/10/206  Sodium 140 potassium 3.8 BUN 16 creatinine 1.15.  Albumin 3.3 otherwise liver function tests within normal limits.  WBC 8.5 hemoglobin 14.7 platelets 280.    Jan 23rd 2016.  INR-2.02.  03/21/2014.  WBC 11.0 hemoglobin 15.1 platelets 280.  Sodium 140 potassium 4.4 BUN 20 creatinine 1.22.  Liver function tests within normal limits except albumin of 3.1.     03/20/2014.  INR 2.69       .  Assessment and plan.    #1- cough-this does continue although chest x-ray does not show any acute process will make Robitussin routine for 72 hours every 6 hour and then when necessary -.  Also will extend routine duo nebs   Every 6 hours for an additional 48 hours and then when necessary   also monitor closely vital signs pulse ox every shift for 72 hours- except for the cough patient appears to be at his baseline today ambulating extensively about the facility in his wheelchair does not complain of shortness of breath but this will have to be watched   #2 anticoagulation management with history of DVT and pulmonary embolism-INR is therapeutic at 2.26 update INR has been ordered       [id number]-- of note greater than 25 minutes spent assessing patient-discussing his status with nursing staff reviewing his chart-- discussing his status previously over the phone with nursing staff- and coordinating formulating a plan of care -of note greater than 50% of time spent coordinating plan of care

## 2015-03-24 ENCOUNTER — Encounter (HOSPITAL_COMMUNITY)
Admission: RE | Admit: 2015-03-24 | Discharge: 2015-03-24 | Disposition: A | Discharge status: Home / Self Care | Payer: Medicare Other | Source: Skilled Nursing Facility | Attending: Internal Medicine | Admitting: Internal Medicine

## 2015-03-24 DIAGNOSIS — Z7901 Long term (current) use of anticoagulants: Secondary | ICD-10-CM | POA: Diagnosis present

## 2015-03-24 LAB — PROTIME-INR
INR: 2.66 — AB (ref 0.00–1.49)
PROTHROMBIN TIME: 28 s — AB (ref 11.6–15.2)

## 2015-04-07 ENCOUNTER — Encounter (HOSPITAL_COMMUNITY)
Admission: RE | Admit: 2015-04-07 | Discharge: 2015-04-07 | Disposition: A | Discharge status: Home / Self Care | Payer: Medicare Other | Source: Skilled Nursing Facility | Attending: Internal Medicine | Admitting: Internal Medicine

## 2015-04-07 DIAGNOSIS — Z7901 Long term (current) use of anticoagulants: Secondary | ICD-10-CM | POA: Diagnosis not present

## 2015-04-07 LAB — PROTIME-INR
INR: 2.85 — ABNORMAL HIGH (ref 0.00–1.49)
PROTHROMBIN TIME: 29.4 s — AB (ref 11.6–15.2)

## 2015-04-21 ENCOUNTER — Encounter (HOSPITAL_COMMUNITY)
Admission: RE | Admit: 2015-04-21 | Discharge: 2015-04-21 | Disposition: A | Discharge status: Home / Self Care | Payer: Medicare Other | Source: Skilled Nursing Facility | Attending: Internal Medicine | Admitting: Internal Medicine

## 2015-04-21 DIAGNOSIS — Z7901 Long term (current) use of anticoagulants: Secondary | ICD-10-CM | POA: Diagnosis not present

## 2015-04-21 LAB — PROTIME-INR
INR: 2.67 — ABNORMAL HIGH (ref 0.00–1.49)
Prothrombin Time: 28 seconds — ABNORMAL HIGH (ref 11.6–15.2)

## 2015-04-28 ENCOUNTER — Encounter (HOSPITAL_COMMUNITY)
Admission: RE | Admit: 2015-04-28 | Discharge: 2015-04-28 | Disposition: A | Discharge status: Home / Self Care | Payer: Medicare Other | Source: Skilled Nursing Facility | Attending: Internal Medicine | Admitting: Internal Medicine

## 2015-04-28 DIAGNOSIS — Z7901 Long term (current) use of anticoagulants: Secondary | ICD-10-CM | POA: Diagnosis not present

## 2015-04-28 LAB — PROTIME-INR
INR: 2.37 — ABNORMAL HIGH (ref 0.00–1.49)
Prothrombin Time: 25.6 seconds — ABNORMAL HIGH (ref 11.6–15.2)

## 2015-05-28 ENCOUNTER — Encounter (HOSPITAL_COMMUNITY)
Admission: RE | Admit: 2015-05-28 | Discharge: 2015-05-28 | Disposition: A | Discharge status: Home / Self Care | Payer: Medicare Other | Source: Skilled Nursing Facility | Attending: Internal Medicine | Admitting: Internal Medicine

## 2015-05-28 ENCOUNTER — Other Ambulatory Visit (HOSPITAL_COMMUNITY)
Admission: RE | Admit: 2015-05-28 | Discharge: 2015-05-28 | Disposition: A | Discharge status: Home / Self Care | Payer: Medicare Other | Source: Skilled Nursing Facility | Attending: Internal Medicine | Admitting: Internal Medicine

## 2015-05-28 DIAGNOSIS — D649 Anemia, unspecified: Secondary | ICD-10-CM | POA: Insufficient documentation

## 2015-05-28 DIAGNOSIS — E785 Hyperlipidemia, unspecified: Secondary | ICD-10-CM | POA: Insufficient documentation

## 2015-05-28 DIAGNOSIS — Z7901 Long term (current) use of anticoagulants: Secondary | ICD-10-CM | POA: Diagnosis present

## 2015-05-28 LAB — PROTIME-INR
INR: 3.33 — ABNORMAL HIGH (ref 0.00–1.49)
Prothrombin Time: 33.1 seconds — ABNORMAL HIGH (ref 11.6–15.2)

## 2015-05-30 ENCOUNTER — Encounter (HOSPITAL_COMMUNITY)
Admission: RE | Admit: 2015-05-30 | Discharge: 2015-05-30 | Disposition: A | Discharge status: Home / Self Care | Payer: Medicare Other | Source: Skilled Nursing Facility | Attending: Internal Medicine | Admitting: Internal Medicine

## 2015-05-30 DIAGNOSIS — E785 Hyperlipidemia, unspecified: Secondary | ICD-10-CM | POA: Diagnosis not present

## 2015-05-30 LAB — PROTIME-INR
INR: 3.85 — AB (ref 0.00–1.49)
PROTHROMBIN TIME: 36.9 s — AB (ref 11.6–15.2)

## 2015-05-31 ENCOUNTER — Other Ambulatory Visit (HOSPITAL_COMMUNITY)
Admission: RE | Admit: 2015-05-31 | Discharge: 2015-05-31 | Disposition: A | Discharge status: Home / Self Care | Payer: Medicare Other | Source: Skilled Nursing Facility | Attending: Internal Medicine | Admitting: Internal Medicine

## 2015-05-31 DIAGNOSIS — D500 Iron deficiency anemia secondary to blood loss (chronic): Secondary | ICD-10-CM | POA: Diagnosis present

## 2015-05-31 DIAGNOSIS — I10 Essential (primary) hypertension: Secondary | ICD-10-CM | POA: Insufficient documentation

## 2015-05-31 LAB — PROTIME-INR
INR: 3.64 — AB (ref 0.00–1.49)
Prothrombin Time: 35.4 seconds — ABNORMAL HIGH (ref 11.6–15.2)

## 2015-06-01 ENCOUNTER — Encounter (HOSPITAL_COMMUNITY)
Admission: RE | Admit: 2015-06-01 | Discharge: 2015-06-01 | Disposition: A | Discharge status: Home / Self Care | Payer: Medicare Other | Source: Skilled Nursing Facility | Attending: Internal Medicine | Admitting: Internal Medicine

## 2015-06-01 DIAGNOSIS — E785 Hyperlipidemia, unspecified: Secondary | ICD-10-CM | POA: Diagnosis not present

## 2015-06-01 LAB — PROTIME-INR
INR: 2.9 — ABNORMAL HIGH (ref 0.00–1.49)
Prothrombin Time: 29.8 seconds — ABNORMAL HIGH (ref 11.6–15.2)

## 2015-06-03 ENCOUNTER — Non-Acute Institutional Stay (SKILLED_NURSING_FACILITY): Payer: Medicare Other | Admitting: Internal Medicine

## 2015-06-03 DIAGNOSIS — Z7901 Long term (current) use of anticoagulants: Secondary | ICD-10-CM

## 2015-06-03 DIAGNOSIS — I10 Essential (primary) hypertension: Secondary | ICD-10-CM

## 2015-06-03 DIAGNOSIS — K5909 Other constipation: Secondary | ICD-10-CM | POA: Diagnosis not present

## 2015-06-03 DIAGNOSIS — Z8673 Personal history of transient ischemic attack (TIA), and cerebral infarction without residual deficits: Secondary | ICD-10-CM | POA: Diagnosis not present

## 2015-06-03 DIAGNOSIS — I2782 Chronic pulmonary embolism: Secondary | ICD-10-CM

## 2015-06-03 NOTE — Progress Notes (Signed)
Patient ID: [name] [name], male   DOB: 06/24/1945, 69 y.o.   MRN: [id number]         This is a routine-  visit.  Level of care skilled.  [facility]   Chief complaint-medical management of CVA late effect-hypertension-history DVT-history of pulmonary embolism--    .  History of present illness.  Patient is a pleasant elderly resident with the above diagnosis-  He has been relatively stable recently-she was treated recently for right arm cellulitis which appears to have resolved venous Doppler was negative for any DVT he does have a history of lower extremity DVT and pulmonary embolisms-he is on chronic Coumadin for this INR therapeutic on December 11 at 2.9 and it had been elevated up to 3.85 on December 9 Coumadin was held he's been restarted on a lower dose at 2.5 mg a day and update INR is pending  He does have a history of hypertension is on lisinopril 20 mg a day as well as labetalol 200 mg daily recent list of blood pressures 144/84-156/89---however I got 134/82 on manual exam this evening this will have to be monitore   Also has a history of pneumonia but this has been stable for some time he is on nebulizers  .  He also has a history of borderline hyperlipidemia he also has a history of fatty liver disease so this has been somewhat conservative treatment recent lipid panel showed cholesterol of 156 triglycerides 61 HDL 34 LDL 110  Today he has no acute complaints denies any chest pain shortness of breath or discomfort --  -continues to ambulate in his wheelchair around the facility    He does have a history CVA with right-sided hemiparesis    Family medical social history has been reviewed per history and physical on 11/05/2009   [facility]  this includes Coumadin 2.5 mg daily.   .  Labetalol 200 mg daily.  Lisinopril 20 mg daily  Artificial tears twice a day.  Guaifenesin DM 10 mL every 6 hours when necessary duo nebs every 6  hours when necessary and routinely twice a day.  Lactulose 15 mL twice a day milk of magnesia daily when necessary.  Senokot S daily.  Tylenol 650 mg every 6 hours when necessary.  Benadryl 12.5 mg every 6 hours when necessary.    . :.  Review of systems.  As stated in history of present illness.  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  Does not complaining of any sore throat.  Respiratory-denies any shortness of breath or cough.  Cardiac-no complaints of chest pain.  GI-no complaints of nausea vomiting diarrhea constipation or abdominal discomfort.  Muscle skeletal-he does have some right-sided weakness status post CVA this is baseline and unchanged.--  Neurologic-- does not complain of headache or dizziness  t  Physical exam.     He is afebrile pulse 64 respirations 18 blood pressure 134/82 taken manually-weight 216.80 continues to gain weight slowly suspect this is appetite related he eats quite well  In general this is a pleasant elderly male in no distress  skin is warm and dry---    .  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  Oropharynx is clear mucous membranes moist.  Chest--shallowair entry   some scattered rhonchi this this appears to be baseline -there  is no labored breathing  Heart is regular rate and rhythm without murmur gallop or rub he has trace lower extremity edema this is somewhat more pronounced on his right side which is not new   Abdomen obese soft nontender with positive bowel sounds.  Muscle skeletal I did not note any deformities he does have right-sided weakness upper and lower extremities does have contracture of his right hand he does have some movement- grip strength is baseline radial pulse intact  Neurologic-as stated above that -- is able to ambulate in his wheelchair and this is his  baseline  he is able to move his right arm some grip strength appears to be intact and at baseline there is a positive radial pulse.  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  Labs  06/01/2015.  INR 2.9 again had been up to 3.85 on December 9.     02/24/2015.  Sodium 137 potassium 4 BUN 21 creatinine 1.09.  WBC 8.2 hemoglobin 14.6 platelets 268.    02/10/2015.  INR 2.0 7  12/04/2014.  Sodium 138 potassium 3.9 BUN 16 creatinine 1.10 CO2 level XXX.  Albumin 3.2 otherwise liver function tests within normal limits.  WBC 7.7 hemoglobin 15.2 platelets 229   11/06/2014.  INR-2.37.  10/09/2014.  Cholesterol 156-triglycerides 61-HDL 34-LDL 110.  Sodium 139 potassium 4.2 BUN 17 creatinine 1.1.  WBC 7.7 hemoglobin 15.2 platelets 229.     10/07/2014.  INR 2.17.  07/31/2014.  Sodium 140 potassium 3.8 BUN 16 creatinine 1.15-liver function tests within normal limits except albumin of 3.3.  WBC 8.5 hemoglobin 14.7 platelets 280.    09/06/2014.  INR 2.05.  08/23/2014.  INR 2.5.  02/10/206  Sodium 140 potassium 3.8 BUN 16 creatinine 1.15.  Albumin 3.3 otherwise liver function tests within normal limits.  WBC 8.5 hemoglobin 14.7 platelets 280.    Jan 23rd 2016.  INR-2.02.  03/21/2014.  WBC 11.0 hemoglobin 15.1 platelets 280.  Sodium 140 potassium 4.4 BUN 20 creatinine 1.22.  Liver function tests within normal limits except albumin of 3.1.     03/20/2014.  INR 2.69       .  Assessment and plan.   Number -1-- CVA --this is a baseline--he continues on Coumadin for anticoagulation-INR is therapeutic now after being slightly supratherapeuticNow on lower dose of Coumadin 2.5 mg a day update INR is pending   #2-hypertension- continues on labetalol and lisinopril-manual reading this evening 134/82 I see  somewhat elevated systolics at times listed in the computer-will order blood pressure checks daily with a log for review he does have a history somewhat variable blood pressures   -   #3 history DVT-again continues on anticoagulation as noted above--update INR is pending   #4-history of pneumonia-this is a stable he is on routine nebulizers as well as when necessary and is on a when necessary expectorant  #5-constipation-this appears stable current medications.  He is on lactulose.   #6-hyperlipidemia-again conservative here secondary to history of fatty liver disease-LDL is minimally elevated we'll continue to monitor -- will update lipid panel and liver function tests      [phone]

## 2015-06-04 ENCOUNTER — Encounter (HOSPITAL_COMMUNITY)
Admission: AD | Admit: 2015-06-04 | Discharge: 2015-06-04 | Disposition: A | Discharge status: Home / Self Care | Payer: Medicare Other | Source: Skilled Nursing Facility | Attending: Internal Medicine | Admitting: Internal Medicine

## 2015-06-04 DIAGNOSIS — E785 Hyperlipidemia, unspecified: Secondary | ICD-10-CM | POA: Diagnosis not present

## 2015-06-04 LAB — COMPREHENSIVE METABOLIC PANEL
ALK PHOS: 47 U/L (ref 38–126)
ALT: 27 U/L (ref 17–63)
AST: 24 U/L (ref 15–41)
Albumin: 3.2 g/dL — ABNORMAL LOW (ref 3.5–5.0)
Anion gap: 6 (ref 5–15)
BILIRUBIN TOTAL: 0.7 mg/dL (ref 0.3–1.2)
BUN: 15 mg/dL (ref 6–20)
CALCIUM: 8.4 mg/dL — AB (ref 8.9–10.3)
CO2: 29 mmol/L (ref 22–32)
CREATININE: 1.17 mg/dL (ref 0.61–1.24)
Chloride: 104 mmol/L (ref 101–111)
GFR calc Af Amer: >60 mL/min (ref >60)
GLUCOSE: 78 mg/dL (ref 65–99)
POTASSIUM: 3.8 mmol/L (ref 3.5–5.1)
Sodium: 139 mmol/L (ref 135–145)
TOTAL PROTEIN: 7.1 g/dL (ref 6.5–8.1)

## 2015-06-04 LAB — CBC WITH DIFFERENTIAL/PLATELET
BASOS ABS: 0 10*3/uL (ref 0.0–0.1)
BASOS PCT: 0 %
EOS ABS: 0.5 10*3/uL (ref 0.0–0.7)
EOS PCT: 6 %
HCT: 47.2 % (ref 39.0–52.0)
Hemoglobin: 15.6 g/dL (ref 13.0–17.0)
Lymphocytes Relative: 29 %
Lymphs Abs: 2.4 10*3/uL (ref 0.7–4.0)
MCH: 30.9 pg (ref 26.0–34.0)
MCHC: 33.1 g/dL (ref 30.0–36.0)
MCV: 93.5 fL (ref 78.0–100.0)
MONO ABS: 0.7 10*3/uL (ref 0.1–1.0)
Monocytes Relative: 9 %
Neutro Abs: 4.7 10*3/uL (ref 1.7–7.7)
Neutrophils Relative %: 56 %
PLATELETS: 277 10*3/uL (ref 150–400)
RBC: 5.05 MIL/uL (ref 4.22–5.81)
RDW: 14 % (ref 11.5–15.5)
WBC: 8.3 10*3/uL (ref 4.0–10.5)

## 2015-06-04 LAB — LIPID PANEL
CHOL/HDL RATIO: 4.3 ratio
CHOLESTEROL: 138 mg/dL (ref 0–200)
HDL: 32 mg/dL — AB (ref >40)
LDL Cholesterol: 93 mg/dL (ref 0–99)
Triglycerides: 66 mg/dL (ref <150)
VLDL: 13 mg/dL (ref 0–40)

## 2015-06-04 LAB — PROTIME-INR
INR: 2.21 — ABNORMAL HIGH (ref 0.00–1.49)
PROTHROMBIN TIME: 24.4 s — AB (ref 11.6–15.2)

## 2015-06-09 ENCOUNTER — Encounter (HOSPITAL_COMMUNITY)
Admission: RE | Admit: 2015-06-09 | Discharge: 2015-06-09 | Disposition: A | Discharge status: Home / Self Care | Payer: Medicare Other | Source: Skilled Nursing Facility | Attending: Internal Medicine | Admitting: Internal Medicine

## 2015-06-09 DIAGNOSIS — E785 Hyperlipidemia, unspecified: Secondary | ICD-10-CM | POA: Diagnosis not present

## 2015-06-09 LAB — PROTIME-INR
INR: 1.62 — ABNORMAL HIGH (ref 0.00–1.49)
Prothrombin Time: 19.3 seconds — ABNORMAL HIGH (ref 11.6–15.2)

## 2015-06-16 ENCOUNTER — Encounter (HOSPITAL_COMMUNITY)
Admission: RE | Admit: 2015-06-16 | Discharge: 2015-06-16 | Disposition: A | Discharge status: Home / Self Care | Payer: Medicare Other | Source: Skilled Nursing Facility | Attending: Internal Medicine | Admitting: Internal Medicine

## 2015-06-16 DIAGNOSIS — E785 Hyperlipidemia, unspecified: Secondary | ICD-10-CM | POA: Diagnosis not present

## 2015-06-16 LAB — PROTIME-INR
INR: 2 — ABNORMAL HIGH (ref 0.00–1.49)
PROTHROMBIN TIME: 22.6 s — AB (ref 11.6–15.2)

## 2015-06-25 DIAGNOSIS — Z961 Presence of intraocular lens: Secondary | ICD-10-CM | POA: Diagnosis not present

## 2015-06-25 DIAGNOSIS — H5203 Hypermetropia, bilateral: Secondary | ICD-10-CM | POA: Diagnosis not present

## 2015-06-25 DIAGNOSIS — H524 Presbyopia: Secondary | ICD-10-CM | POA: Diagnosis not present

## 2015-06-25 DIAGNOSIS — H52223 Regular astigmatism, bilateral: Secondary | ICD-10-CM | POA: Diagnosis not present

## 2015-06-30 ENCOUNTER — Encounter (HOSPITAL_COMMUNITY)
Admission: RE | Admit: 2015-06-30 | Discharge: 2015-06-30 | Disposition: A | Discharge status: Home / Self Care | Payer: Medicare Other | Source: Skilled Nursing Facility | Attending: Internal Medicine | Admitting: Internal Medicine

## 2015-06-30 DIAGNOSIS — Z7901 Long term (current) use of anticoagulants: Secondary | ICD-10-CM | POA: Insufficient documentation

## 2015-06-30 LAB — PROTIME-INR
INR: 1.81 — ABNORMAL HIGH (ref 0.00–1.49)
Prothrombin Time: 20.9 seconds — ABNORMAL HIGH (ref 11.6–15.2)

## 2015-07-07 ENCOUNTER — Encounter (HOSPITAL_COMMUNITY)
Admission: RE | Admit: 2015-07-07 | Discharge: 2015-07-07 | Disposition: A | Discharge status: Home / Self Care | Payer: Medicare Other | Source: Skilled Nursing Facility | Attending: Internal Medicine | Admitting: Internal Medicine

## 2015-07-07 DIAGNOSIS — Z7901 Long term (current) use of anticoagulants: Secondary | ICD-10-CM | POA: Diagnosis not present

## 2015-07-07 LAB — PROTIME-INR
INR: 2.23 — AB (ref 0.00–1.49)
PROTHROMBIN TIME: 24.5 s — AB (ref 11.6–15.2)

## 2015-07-14 ENCOUNTER — Encounter (HOSPITAL_COMMUNITY)
Admission: RE | Admit: 2015-07-14 | Discharge: 2015-07-14 | Disposition: A | Discharge status: Home / Self Care | Payer: Medicare Other | Source: Skilled Nursing Facility | Attending: Internal Medicine | Admitting: Internal Medicine

## 2015-07-14 DIAGNOSIS — B351 Tinea unguium: Secondary | ICD-10-CM | POA: Diagnosis not present

## 2015-07-14 DIAGNOSIS — Z7901 Long term (current) use of anticoagulants: Secondary | ICD-10-CM | POA: Diagnosis not present

## 2015-07-14 DIAGNOSIS — M79672 Pain in left foot: Secondary | ICD-10-CM | POA: Diagnosis not present

## 2015-07-14 DIAGNOSIS — I739 Peripheral vascular disease, unspecified: Secondary | ICD-10-CM | POA: Diagnosis not present

## 2015-07-14 DIAGNOSIS — M79671 Pain in right foot: Secondary | ICD-10-CM | POA: Diagnosis not present

## 2015-07-14 LAB — PROTIME-INR
INR: 2.91 — AB (ref 0.00–1.49)
PROTHROMBIN TIME: 29.9 s — AB (ref 11.6–15.2)

## 2015-07-21 ENCOUNTER — Encounter (HOSPITAL_COMMUNITY)
Admission: RE | Admit: 2015-07-21 | Discharge: 2015-07-21 | Disposition: A | Discharge status: Home / Self Care | Payer: Medicare Other | Source: Skilled Nursing Facility | Attending: Internal Medicine | Admitting: Internal Medicine

## 2015-07-21 DIAGNOSIS — Z7901 Long term (current) use of anticoagulants: Secondary | ICD-10-CM | POA: Diagnosis not present

## 2015-07-21 LAB — PROTIME-INR
INR: 2.26 — AB (ref 0.00–1.49)
PROTHROMBIN TIME: 24.7 s — AB (ref 11.6–15.2)

## 2015-07-28 ENCOUNTER — Encounter (HOSPITAL_COMMUNITY)
Admission: RE | Admit: 2015-07-28 | Discharge: 2015-07-28 | Disposition: A | Discharge status: Home / Self Care | Payer: Medicare Other | Source: Skilled Nursing Facility | Attending: Internal Medicine | Admitting: Internal Medicine

## 2015-07-28 DIAGNOSIS — Z7901 Long term (current) use of anticoagulants: Secondary | ICD-10-CM | POA: Insufficient documentation

## 2015-07-28 DIAGNOSIS — I10 Essential (primary) hypertension: Secondary | ICD-10-CM | POA: Diagnosis not present

## 2015-07-28 LAB — PROTIME-INR
INR: 2.13 — AB (ref 0.00–1.49)
Prothrombin Time: 23.6 seconds — ABNORMAL HIGH (ref 11.6–15.2)

## 2015-08-04 ENCOUNTER — Encounter (HOSPITAL_COMMUNITY)
Admission: RE | Admit: 2015-08-04 | Discharge: 2015-08-04 | Disposition: A | Discharge status: Home / Self Care | Payer: Medicare Other | Source: Skilled Nursing Facility | Attending: Internal Medicine | Admitting: Internal Medicine

## 2015-08-04 DIAGNOSIS — Z7901 Long term (current) use of anticoagulants: Secondary | ICD-10-CM | POA: Diagnosis not present

## 2015-08-04 DIAGNOSIS — I10 Essential (primary) hypertension: Secondary | ICD-10-CM | POA: Diagnosis not present

## 2015-08-04 LAB — PROTIME-INR
INR: 1.89 — AB (ref 0.00–1.49)
Prothrombin Time: 21.6 seconds — ABNORMAL HIGH (ref 11.6–15.2)

## 2015-08-07 ENCOUNTER — Encounter (HOSPITAL_COMMUNITY)
Admission: AD | Admit: 2015-08-07 | Discharge: 2015-08-07 | Disposition: A | Discharge status: Home / Self Care | Payer: Medicare Other | Source: Skilled Nursing Facility | Attending: Internal Medicine | Admitting: Internal Medicine

## 2015-08-07 DIAGNOSIS — Z7901 Long term (current) use of anticoagulants: Secondary | ICD-10-CM | POA: Diagnosis not present

## 2015-08-07 DIAGNOSIS — I10 Essential (primary) hypertension: Secondary | ICD-10-CM | POA: Diagnosis not present

## 2015-08-07 LAB — PROTIME-INR
INR: 2.02 — ABNORMAL HIGH (ref 0.00–1.49)
Prothrombin Time: 22.8 seconds — ABNORMAL HIGH (ref 11.6–15.2)

## 2015-08-11 ENCOUNTER — Encounter (HOSPITAL_COMMUNITY)
Admission: RE | Admit: 2015-08-11 | Discharge: 2015-08-11 | Disposition: A | Discharge status: Home / Self Care | Payer: Medicare Other | Source: Skilled Nursing Facility | Attending: Internal Medicine | Admitting: Internal Medicine

## 2015-08-11 DIAGNOSIS — Z7901 Long term (current) use of anticoagulants: Secondary | ICD-10-CM | POA: Diagnosis not present

## 2015-08-11 DIAGNOSIS — I10 Essential (primary) hypertension: Secondary | ICD-10-CM | POA: Diagnosis not present

## 2015-08-11 LAB — PROTIME-INR
INR: 2.49 — AB (ref 0.00–1.49)
PROTHROMBIN TIME: 26.6 s — AB (ref 11.6–15.2)

## 2015-08-18 ENCOUNTER — Encounter (HOSPITAL_COMMUNITY)
Admission: RE | Admit: 2015-08-18 | Discharge: 2015-08-18 | Disposition: A | Discharge status: Home / Self Care | Payer: Medicare Other | Source: Skilled Nursing Facility | Attending: Internal Medicine | Admitting: Internal Medicine

## 2015-08-18 DIAGNOSIS — I10 Essential (primary) hypertension: Secondary | ICD-10-CM | POA: Diagnosis not present

## 2015-08-18 DIAGNOSIS — Z7901 Long term (current) use of anticoagulants: Secondary | ICD-10-CM | POA: Diagnosis not present

## 2015-08-18 LAB — PROTIME-INR
INR: 2.61 — AB (ref 0.00–1.49)
Prothrombin Time: 27.6 seconds — ABNORMAL HIGH (ref 11.6–15.2)

## 2015-08-18 LAB — APTT: APTT: 41 s — AB (ref 24–37)

## 2015-08-25 ENCOUNTER — Encounter (HOSPITAL_COMMUNITY)
Admission: RE | Admit: 2015-08-25 | Discharge: 2015-08-25 | Disposition: A | Discharge status: Home / Self Care | Payer: Medicare Other | Source: Skilled Nursing Facility | Attending: Internal Medicine | Admitting: Internal Medicine

## 2015-08-25 DIAGNOSIS — G8191 Hemiplegia, unspecified affecting right dominant side: Secondary | ICD-10-CM | POA: Diagnosis not present

## 2015-08-25 DIAGNOSIS — I69998 Other sequelae following unspecified cerebrovascular disease: Secondary | ICD-10-CM | POA: Diagnosis not present

## 2015-08-25 DIAGNOSIS — Z7901 Long term (current) use of anticoagulants: Secondary | ICD-10-CM | POA: Insufficient documentation

## 2015-08-25 DIAGNOSIS — Z79899 Other long term (current) drug therapy: Secondary | ICD-10-CM | POA: Diagnosis not present

## 2015-08-25 DIAGNOSIS — I10 Essential (primary) hypertension: Secondary | ICD-10-CM | POA: Insufficient documentation

## 2015-08-25 DIAGNOSIS — R293 Abnormal posture: Secondary | ICD-10-CM | POA: Insufficient documentation

## 2015-08-25 LAB — PROTIME-INR
INR: 3.5 — ABNORMAL HIGH (ref 0.00–1.49)
Prothrombin Time: 34.4 seconds — ABNORMAL HIGH (ref 11.6–15.2)

## 2015-08-26 ENCOUNTER — Encounter (HOSPITAL_COMMUNITY)
Admission: AD | Admit: 2015-08-26 | Discharge: 2015-08-26 | Disposition: A | Discharge status: Home / Self Care | Payer: Medicare Other | Source: Skilled Nursing Facility | Attending: Internal Medicine | Admitting: Internal Medicine

## 2015-08-26 DIAGNOSIS — I69998 Other sequelae following unspecified cerebrovascular disease: Secondary | ICD-10-CM | POA: Diagnosis not present

## 2015-08-26 DIAGNOSIS — I10 Essential (primary) hypertension: Secondary | ICD-10-CM | POA: Diagnosis not present

## 2015-08-26 DIAGNOSIS — Z79899 Other long term (current) drug therapy: Secondary | ICD-10-CM | POA: Diagnosis not present

## 2015-08-26 DIAGNOSIS — Z7901 Long term (current) use of anticoagulants: Secondary | ICD-10-CM | POA: Diagnosis not present

## 2015-08-26 DIAGNOSIS — G8191 Hemiplegia, unspecified affecting right dominant side: Secondary | ICD-10-CM | POA: Diagnosis not present

## 2015-08-26 DIAGNOSIS — R293 Abnormal posture: Secondary | ICD-10-CM | POA: Diagnosis not present

## 2015-08-26 LAB — PROTIME-INR
INR: 2.86 — AB (ref 0.00–1.49)
PROTHROMBIN TIME: 29.6 s — AB (ref 11.6–15.2)

## 2015-08-27 ENCOUNTER — Encounter (HOSPITAL_COMMUNITY)
Admission: RE | Admit: 2015-08-27 | Discharge: 2015-08-27 | Disposition: A | Discharge status: Home / Self Care | Payer: Medicare Other | Source: Skilled Nursing Facility | Attending: Internal Medicine | Admitting: Internal Medicine

## 2015-08-28 ENCOUNTER — Encounter (HOSPITAL_COMMUNITY)
Admission: RE | Admit: 2015-08-28 | Discharge: 2015-08-28 | Disposition: A | Discharge status: Home / Self Care | Payer: Medicare Other | Source: Skilled Nursing Facility | Attending: Internal Medicine | Admitting: Internal Medicine

## 2015-08-28 DIAGNOSIS — Z79899 Other long term (current) drug therapy: Secondary | ICD-10-CM | POA: Diagnosis not present

## 2015-08-28 DIAGNOSIS — G8191 Hemiplegia, unspecified affecting right dominant side: Secondary | ICD-10-CM | POA: Diagnosis not present

## 2015-08-28 DIAGNOSIS — I69998 Other sequelae following unspecified cerebrovascular disease: Secondary | ICD-10-CM | POA: Diagnosis not present

## 2015-08-28 DIAGNOSIS — Z7901 Long term (current) use of anticoagulants: Secondary | ICD-10-CM | POA: Diagnosis not present

## 2015-08-28 DIAGNOSIS — R293 Abnormal posture: Secondary | ICD-10-CM | POA: Diagnosis not present

## 2015-08-28 DIAGNOSIS — I10 Essential (primary) hypertension: Secondary | ICD-10-CM | POA: Diagnosis not present

## 2015-08-28 LAB — PROTIME-INR
INR: 2.43 — ABNORMAL HIGH (ref 0.00–1.49)
Prothrombin Time: 26.1 seconds — ABNORMAL HIGH (ref 11.6–15.2)

## 2015-09-01 ENCOUNTER — Other Ambulatory Visit (HOSPITAL_COMMUNITY)
Admission: RE | Admit: 2015-09-01 | Discharge: 2015-09-01 | Disposition: A | Discharge status: Home / Self Care | Payer: Medicare Other | Source: Ambulatory Visit | Attending: Internal Medicine | Admitting: Internal Medicine

## 2015-09-01 DIAGNOSIS — Z79899 Other long term (current) drug therapy: Secondary | ICD-10-CM | POA: Insufficient documentation

## 2015-09-01 LAB — PROTIME-INR
INR: 1.91 — AB (ref 0.00–1.49)
PROTHROMBIN TIME: 21.8 s — AB (ref 11.6–15.2)

## 2015-09-04 ENCOUNTER — Encounter (HOSPITAL_COMMUNITY)
Admission: RE | Admit: 2015-09-04 | Discharge: 2015-09-04 | Disposition: A | Discharge status: Home / Self Care | Payer: Medicare Other | Source: Skilled Nursing Facility | Attending: Internal Medicine | Admitting: Internal Medicine

## 2015-09-04 DIAGNOSIS — I69998 Other sequelae following unspecified cerebrovascular disease: Secondary | ICD-10-CM | POA: Diagnosis not present

## 2015-09-04 DIAGNOSIS — G8191 Hemiplegia, unspecified affecting right dominant side: Secondary | ICD-10-CM | POA: Diagnosis not present

## 2015-09-04 DIAGNOSIS — Z7901 Long term (current) use of anticoagulants: Secondary | ICD-10-CM | POA: Diagnosis not present

## 2015-09-04 DIAGNOSIS — Z79899 Other long term (current) drug therapy: Secondary | ICD-10-CM | POA: Diagnosis not present

## 2015-09-04 DIAGNOSIS — I10 Essential (primary) hypertension: Secondary | ICD-10-CM | POA: Diagnosis not present

## 2015-09-04 DIAGNOSIS — R293 Abnormal posture: Secondary | ICD-10-CM | POA: Diagnosis not present

## 2015-09-04 LAB — PROTIME-INR
INR: 1.98 — AB (ref 0.00–1.49)
PROTHROMBIN TIME: 22.4 s — AB (ref 11.6–15.2)

## 2015-09-08 ENCOUNTER — Encounter (HOSPITAL_COMMUNITY)
Admission: RE | Admit: 2015-09-08 | Discharge: 2015-09-08 | Disposition: A | Discharge status: Home / Self Care | Payer: Medicare Other | Source: Skilled Nursing Facility | Attending: Internal Medicine | Admitting: Internal Medicine

## 2015-09-08 DIAGNOSIS — I10 Essential (primary) hypertension: Secondary | ICD-10-CM | POA: Diagnosis not present

## 2015-09-08 DIAGNOSIS — R293 Abnormal posture: Secondary | ICD-10-CM | POA: Diagnosis not present

## 2015-09-08 DIAGNOSIS — Z7901 Long term (current) use of anticoagulants: Secondary | ICD-10-CM | POA: Diagnosis not present

## 2015-09-08 DIAGNOSIS — Z79899 Other long term (current) drug therapy: Secondary | ICD-10-CM | POA: Diagnosis not present

## 2015-09-08 DIAGNOSIS — G8191 Hemiplegia, unspecified affecting right dominant side: Secondary | ICD-10-CM | POA: Diagnosis not present

## 2015-09-08 DIAGNOSIS — I69998 Other sequelae following unspecified cerebrovascular disease: Secondary | ICD-10-CM | POA: Diagnosis not present

## 2015-09-08 LAB — PROTIME-INR
INR: 2.19 — AB (ref 0.00–1.49)
PROTHROMBIN TIME: 24.2 s — AB (ref 11.6–15.2)

## 2015-09-12 ENCOUNTER — Encounter (HOSPITAL_COMMUNITY)
Admission: RE | Admit: 2015-09-12 | Discharge: 2015-09-12 | Disposition: A | Discharge status: Home / Self Care | Payer: Medicare Other | Source: Skilled Nursing Facility | Attending: *Deleted | Admitting: *Deleted

## 2015-09-12 DIAGNOSIS — I69998 Other sequelae following unspecified cerebrovascular disease: Secondary | ICD-10-CM | POA: Diagnosis not present

## 2015-09-12 DIAGNOSIS — Z79899 Other long term (current) drug therapy: Secondary | ICD-10-CM | POA: Diagnosis not present

## 2015-09-12 DIAGNOSIS — I10 Essential (primary) hypertension: Secondary | ICD-10-CM | POA: Diagnosis not present

## 2015-09-12 DIAGNOSIS — Z7901 Long term (current) use of anticoagulants: Secondary | ICD-10-CM | POA: Diagnosis not present

## 2015-09-12 DIAGNOSIS — G8191 Hemiplegia, unspecified affecting right dominant side: Secondary | ICD-10-CM | POA: Diagnosis not present

## 2015-09-12 DIAGNOSIS — R293 Abnormal posture: Secondary | ICD-10-CM | POA: Diagnosis not present

## 2015-09-12 LAB — CBC WITH DIFFERENTIAL/PLATELET
BASOS ABS: 0 10*3/uL (ref 0.0–0.1)
BASOS PCT: 0 %
EOS PCT: 6 %
Eosinophils Absolute: 0.5 10*3/uL (ref 0.0–0.7)
HEMATOCRIT: 44.9 % (ref 39.0–52.0)
Hemoglobin: 14.5 g/dL (ref 13.0–17.0)
LYMPHS PCT: 24 %
Lymphs Abs: 1.9 10*3/uL (ref 0.7–4.0)
MCH: 30.5 pg (ref 26.0–34.0)
MCHC: 32.3 g/dL (ref 30.0–36.0)
MCV: 94.5 fL (ref 78.0–100.0)
MONO ABS: 0.6 10*3/uL (ref 0.1–1.0)
Monocytes Relative: 7 %
NEUTROS ABS: 4.9 10*3/uL (ref 1.7–7.7)
Neutrophils Relative %: 63 %
PLATELETS: 292 10*3/uL (ref 150–400)
RBC: 4.75 MIL/uL (ref 4.22–5.81)
RDW: 13.8 % (ref 11.5–15.5)
WBC: 8 10*3/uL (ref 4.0–10.5)

## 2015-09-12 LAB — COMPREHENSIVE METABOLIC PANEL
ALBUMIN: 3.2 g/dL — AB (ref 3.5–5.0)
ALT: 22 U/L (ref 17–63)
AST: 20 U/L (ref 15–41)
Alkaline Phosphatase: 48 U/L (ref 38–126)
Anion gap: 8 (ref 5–15)
BUN: 18 mg/dL (ref 6–20)
CALCIUM: 8.5 mg/dL — AB (ref 8.9–10.3)
CO2: 29 mmol/L (ref 22–32)
Chloride: 105 mmol/L (ref 101–111)
Creatinine, Ser: 1.07 mg/dL (ref 0.61–1.24)
GFR calc Af Amer: >60 mL/min (ref >60)
GFR calc non Af Amer: >60 mL/min (ref >60)
GLUCOSE: 76 mg/dL (ref 65–99)
POTASSIUM: 4 mmol/L (ref 3.5–5.1)
SODIUM: 142 mmol/L (ref 135–145)
TOTAL PROTEIN: 6.9 g/dL (ref 6.5–8.1)
Total Bilirubin: 0.6 mg/dL (ref 0.3–1.2)

## 2015-09-12 LAB — BASIC METABOLIC PANEL
BUN: 18 mg/dL (ref 4–21)
Creatinine: 1.1 mg/dL (ref <=1.3)
GLUCOSE: 76 mg/dL
Sodium: 142 mmol/L (ref 137–147)

## 2015-09-12 LAB — CBC AND DIFFERENTIAL
Neutrophils Absolute: 63 /uL
WBC: 8 10^3/mL

## 2015-09-12 LAB — HEPATIC FUNCTION PANEL: Bilirubin, Total: 0.6 mg/dL

## 2015-09-15 ENCOUNTER — Encounter (HOSPITAL_COMMUNITY)
Admission: RE | Admit: 2015-09-15 | Discharge: 2015-09-15 | Disposition: A | Discharge status: Home / Self Care | Payer: Medicare Other | Source: Skilled Nursing Facility | Attending: Internal Medicine | Admitting: Internal Medicine

## 2015-09-15 DIAGNOSIS — I10 Essential (primary) hypertension: Secondary | ICD-10-CM | POA: Diagnosis not present

## 2015-09-15 DIAGNOSIS — Z7901 Long term (current) use of anticoagulants: Secondary | ICD-10-CM | POA: Diagnosis not present

## 2015-09-15 DIAGNOSIS — R293 Abnormal posture: Secondary | ICD-10-CM | POA: Diagnosis not present

## 2015-09-15 DIAGNOSIS — G8191 Hemiplegia, unspecified affecting right dominant side: Secondary | ICD-10-CM | POA: Diagnosis not present

## 2015-09-15 DIAGNOSIS — Z79899 Other long term (current) drug therapy: Secondary | ICD-10-CM | POA: Diagnosis not present

## 2015-09-15 DIAGNOSIS — I69998 Other sequelae following unspecified cerebrovascular disease: Secondary | ICD-10-CM | POA: Diagnosis not present

## 2015-09-15 LAB — PROTIME-INR
INR: 2.24 — ABNORMAL HIGH (ref 0.00–1.49)
PROTHROMBIN TIME: 24.6 s — AB (ref 11.6–15.2)
Protime: 24.6 seconds — AB (ref 10.0–13.8)

## 2015-09-22 ENCOUNTER — Encounter (HOSPITAL_COMMUNITY)
Admission: RE | Admit: 2015-09-22 | Discharge: 2015-09-22 | Disposition: A | Discharge status: Home / Self Care | Payer: Medicare Other | Source: Skilled Nursing Facility | Attending: Internal Medicine | Admitting: Internal Medicine

## 2015-09-22 DIAGNOSIS — Z7901 Long term (current) use of anticoagulants: Secondary | ICD-10-CM | POA: Diagnosis not present

## 2015-09-22 DIAGNOSIS — I69998 Other sequelae following unspecified cerebrovascular disease: Secondary | ICD-10-CM | POA: Diagnosis not present

## 2015-09-22 DIAGNOSIS — R293 Abnormal posture: Secondary | ICD-10-CM | POA: Insufficient documentation

## 2015-09-22 DIAGNOSIS — I10 Essential (primary) hypertension: Secondary | ICD-10-CM | POA: Diagnosis not present

## 2015-09-22 DIAGNOSIS — G8191 Hemiplegia, unspecified affecting right dominant side: Secondary | ICD-10-CM | POA: Insufficient documentation

## 2015-09-22 LAB — PROTIME-INR
INR: 2.18 — ABNORMAL HIGH (ref 0.00–1.49)
PROTHROMBIN TIME: 24.1 s — AB (ref 11.6–15.2)

## 2015-09-26 ENCOUNTER — Encounter (HOSPITAL_COMMUNITY)
Admission: RE | Admit: 2015-09-26 | Discharge: 2015-09-26 | Disposition: A | Discharge status: Home / Self Care | Payer: Medicare Other | Source: Skilled Nursing Facility | Attending: Internal Medicine | Admitting: Internal Medicine

## 2015-09-26 DIAGNOSIS — I69998 Other sequelae following unspecified cerebrovascular disease: Secondary | ICD-10-CM | POA: Diagnosis not present

## 2015-09-26 DIAGNOSIS — I10 Essential (primary) hypertension: Secondary | ICD-10-CM | POA: Diagnosis not present

## 2015-09-26 DIAGNOSIS — G8191 Hemiplegia, unspecified affecting right dominant side: Secondary | ICD-10-CM | POA: Diagnosis not present

## 2015-09-26 DIAGNOSIS — R293 Abnormal posture: Secondary | ICD-10-CM | POA: Diagnosis not present

## 2015-09-26 DIAGNOSIS — Z7901 Long term (current) use of anticoagulants: Secondary | ICD-10-CM | POA: Diagnosis not present

## 2015-09-26 LAB — PROTIME-INR
INR: 1.35 (ref 0.00–1.49)
Prothrombin Time: 16.8 seconds — ABNORMAL HIGH (ref 11.6–15.2)

## 2015-09-29 ENCOUNTER — Encounter (HOSPITAL_COMMUNITY)
Admission: RE | Admit: 2015-09-29 | Discharge: 2015-09-29 | Disposition: A | Discharge status: Home / Self Care | Payer: Medicare Other | Source: Skilled Nursing Facility | Attending: Internal Medicine | Admitting: Internal Medicine

## 2015-09-29 DIAGNOSIS — G8191 Hemiplegia, unspecified affecting right dominant side: Secondary | ICD-10-CM | POA: Diagnosis not present

## 2015-09-29 DIAGNOSIS — R293 Abnormal posture: Secondary | ICD-10-CM | POA: Diagnosis not present

## 2015-09-29 DIAGNOSIS — Z7901 Long term (current) use of anticoagulants: Secondary | ICD-10-CM | POA: Diagnosis not present

## 2015-09-29 DIAGNOSIS — I10 Essential (primary) hypertension: Secondary | ICD-10-CM | POA: Diagnosis not present

## 2015-09-29 DIAGNOSIS — I69998 Other sequelae following unspecified cerebrovascular disease: Secondary | ICD-10-CM | POA: Diagnosis not present

## 2015-09-29 LAB — PROTIME-INR
INR: 1.74 — AB (ref 0.00–1.49)
Prothrombin Time: 20.3 seconds — ABNORMAL HIGH (ref 11.6–15.2)

## 2015-10-01 ENCOUNTER — Non-Acute Institutional Stay (SKILLED_NURSING_FACILITY): Payer: Medicare Other | Admitting: Internal Medicine

## 2015-10-01 ENCOUNTER — Encounter (HOSPITAL_COMMUNITY)
Admission: RE | Admit: 2015-10-01 | Discharge: 2015-10-01 | Disposition: A | Discharge status: Home / Self Care | Payer: Medicare Other | Source: Skilled Nursing Facility | Attending: Internal Medicine | Admitting: Internal Medicine

## 2015-10-01 DIAGNOSIS — I2782 Chronic pulmonary embolism: Secondary | ICD-10-CM

## 2015-10-01 DIAGNOSIS — Z8673 Personal history of transient ischemic attack (TIA), and cerebral infarction without residual deficits: Secondary | ICD-10-CM | POA: Diagnosis not present

## 2015-10-01 DIAGNOSIS — Z7901 Long term (current) use of anticoagulants: Secondary | ICD-10-CM

## 2015-10-01 DIAGNOSIS — I69998 Other sequelae following unspecified cerebrovascular disease: Secondary | ICD-10-CM | POA: Diagnosis not present

## 2015-10-01 DIAGNOSIS — I10 Essential (primary) hypertension: Secondary | ICD-10-CM | POA: Diagnosis not present

## 2015-10-01 DIAGNOSIS — R293 Abnormal posture: Secondary | ICD-10-CM | POA: Diagnosis not present

## 2015-10-01 DIAGNOSIS — G8191 Hemiplegia, unspecified affecting right dominant side: Secondary | ICD-10-CM | POA: Diagnosis not present

## 2015-10-01 LAB — PROTIME-INR
INR: 1.94 — ABNORMAL HIGH (ref 0.00–1.49)
PROTHROMBIN TIME: 22 s — AB (ref 11.6–15.2)

## 2015-10-01 NOTE — Progress Notes (Signed)
Patient ID: [name] [name], male   DOB: 1945-12-16, 70 y.o.   MRN: [id number]

## 2015-10-03 ENCOUNTER — Encounter (HOSPITAL_COMMUNITY)
Admission: RE | Admit: 2015-10-03 | Discharge: 2015-10-03 | Disposition: A | Discharge status: Home / Self Care | Payer: Medicare Other | Source: Skilled Nursing Facility | Attending: Internal Medicine | Admitting: Internal Medicine

## 2015-10-03 DIAGNOSIS — I10 Essential (primary) hypertension: Secondary | ICD-10-CM | POA: Diagnosis not present

## 2015-10-03 DIAGNOSIS — R293 Abnormal posture: Secondary | ICD-10-CM | POA: Diagnosis not present

## 2015-10-03 DIAGNOSIS — Z7901 Long term (current) use of anticoagulants: Secondary | ICD-10-CM | POA: Diagnosis not present

## 2015-10-03 DIAGNOSIS — G8191 Hemiplegia, unspecified affecting right dominant side: Secondary | ICD-10-CM | POA: Diagnosis not present

## 2015-10-03 DIAGNOSIS — I69998 Other sequelae following unspecified cerebrovascular disease: Secondary | ICD-10-CM | POA: Diagnosis not present

## 2015-10-03 LAB — PROTIME-INR
INR: 2.11 — ABNORMAL HIGH (ref 0.00–1.49)
Prothrombin Time: 23.5 seconds — ABNORMAL HIGH (ref 11.6–15.2)

## 2015-10-05 DIAGNOSIS — Z8709 Personal history of other diseases of the respiratory system: Secondary | ICD-10-CM | POA: Insufficient documentation

## 2015-10-05 NOTE — Progress Notes (Signed)
Patient ID: [name] [name], male   DOB: [birth year], 70 y.o.   MRN: [id number]          This is a routine-  visit.  Level of care skilled.  [facility]   Chief complaint-medical management of CVA late effect-hypertension-history DVT-history of pulmonary embolism--    .  History of present illness.  Patient is a pleasant elderly resident with the above diagnosis-  He has been relatively stable recently-most acute issue recently was late last year he had some cellulitis of his left arm that resolved with antibiotics.  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     He does have a history of hypertension is on lisinopril 20 mg a day as well as labetalol 200 mg daily recent list of blood pressures 138/88-141/76-I took it manually and got 112/80   Also has a history of pneumonia but this has been stable for some time he is on nebulizers  .  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   .  Labetalol 200 mg daily.  Lisinopril 20 mg daily  Artificial tears twice a day.  Guaifenesin DM 10 mL every 6 hours when necessary duo nebs every 6 hours when necessary and routinely twice a day.  Lactulose 15 mL twice a day milk  of magnesia daily when necessary.  Senokot S daily.  Tylenol 650 mg every 6 hours when necessary.  Benadryl 12.5 mg every 6 hours when necessary.    . :.  Review of systems.  As stated in history of present illness.  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  Does not complaining of any sore throat.  Respiratory-denies any shortness of breath or cough.  Cardiac-no complaints of chest pain.  GI-no complaints of nausea vomiting diarrhea constipation or abdominal discomfort.  Muscle skeletal-he does have some right-sided weakness status post CVA this is baseline and unchanged.--  Neurologic-- does not complain of headache or dizziness  t  Physical exam.     Temperature is 98.0 pulse 80 respirations 18 blood pressure taken manually 112/80 weight is 211.8 this appears to be down about 5 pounds since December and I suspect is desired ll  In general this is a pleasant elderly male in no distress  skin is warm and dry---    .  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  Oropharynx is clear mucous membranes moist.  Chest--shallowair entry   some scattered rhonchi this this appears to be baseline -there is no labored breathing  Heart is regular  rate and rhythm without murmur gallop or rub he has trace lower extremity edema this is somewhat more pronounced on his right side which is not new   Abdomen obese soft nontender with positive bowel sounds.  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  Labs  09/12/2015.  Sodium 142 potassium 4 BUN 18 creatinine 1.073  Albumin 3.2 otherwise liver function tests within normal limits.  WBC 8.0 hemoglobin 14.5 platelets 292.    06/01/2015.  INR 2.9 again had been up to 3.85 on December 9.     02/24/2015.  Sodium 137 potassium 4 BUN 21 creatinine 1.09.  WBC 8.2 hemoglobin 14.6 platelets 268.    02/10/2015.  INR 2.0 7  12/04/2014.  Sodium 138 potassium 3.9 BUN 16 creatinine 1.10 CO2 level XXX.  Albumin 3.2 otherwise liver function tests within normal limits.  WBC 7.7 hemoglobin 15.2 platelets 229   11/06/2014.  INR-2.37.  10/09/2014.  Cholesterol 156-triglycerides 61-HDL 34-LDL 110.  Sodium 139 potassium 4.2 BUN 17 creatinine 1.1.  WBC 7.7 hemoglobin 15.2 platelets 229.     10/07/2014.  INR 2.17.  07/31/2014.  Sodium 140 potassium 3.8 BUN 16 creatinine 1.15-liver function tests within normal limits except albumin of 3.3.  WBC 8.5 hemoglobin 14.7 platelets 280.    09/06/2014.  INR 2.05.  08/23/2014.  INR 2.5.  02/10/206  Sodium 140 potassium 3.8 BUN 16 creatinine 1.15.  Albumin 3.3 otherwise liver function tests within normal limits.  WBC 8.5 hemoglobin 14.7 platelets 280.    Jan 23rd 2016.  INR-2.02.  03/21/2014.  WBC 11.0 hemoglobin 15.1 platelets 280.  Sodium 140 potassium 4.4 BUN 20 creatinine 1.22.  Liver function tests within normal limits except albumin of 3.1.     03/20/2014.  INR 2.69       .  Assessment and plan.   Number -1-- CVA --this is a baseline--he continues on Coumadin for anticoagulation-INR has been stable generally but slightly subtherapeutic recently however it is now rising back in the normal range at 1.94  will continue current dose of 4 mg since it appears to be rising-this will have to be watched however so he does not go supratherapeutic-update INR is scheduled for Friday, April 14    #2-hypertension- continues on labetalol and lisinopril I do not see consistent elevations blood pressure taken manually today 112/80 at this point monitor--renal function appears stable per lab done late last month  -   #3 history DVT-again continues on anticoagulation as noted above--update INR is pending   #4-history of pneumonia-this is a stable he is on routine nebulizers as well as when necessary and is on a when necessary expectorant  #5-constipation-this appears stable current medications.  He is on lactulose.   #6-hyperlipidemia-again conservative here secondary to history of fatty liver disease-LDL is minimally elevated we'll continue to monitor -- will update liver function tests      [phone]

## 2015-10-07 ENCOUNTER — Non-Acute Institutional Stay (SKILLED_NURSING_FACILITY): Payer: Medicare Other | Admitting: Internal Medicine

## 2015-10-07 ENCOUNTER — Encounter (HOSPITAL_COMMUNITY)
Admission: RE | Admit: 2015-10-07 | Discharge: 2015-10-07 | Disposition: A | Discharge status: Home / Self Care | Payer: Medicare Other | Source: Skilled Nursing Facility | Attending: Internal Medicine | Admitting: Internal Medicine

## 2015-10-07 DIAGNOSIS — I69998 Other sequelae following unspecified cerebrovascular disease: Secondary | ICD-10-CM | POA: Diagnosis not present

## 2015-10-07 DIAGNOSIS — Z7901 Long term (current) use of anticoagulants: Secondary | ICD-10-CM | POA: Diagnosis not present

## 2015-10-07 DIAGNOSIS — R29898 Other symptoms and signs involving the musculoskeletal system: Secondary | ICD-10-CM | POA: Diagnosis not present

## 2015-10-07 DIAGNOSIS — Z5181 Encounter for therapeutic drug level monitoring: Secondary | ICD-10-CM | POA: Diagnosis not present

## 2015-10-07 DIAGNOSIS — R293 Abnormal posture: Secondary | ICD-10-CM | POA: Diagnosis not present

## 2015-10-07 DIAGNOSIS — I10 Essential (primary) hypertension: Secondary | ICD-10-CM | POA: Diagnosis not present

## 2015-10-07 DIAGNOSIS — L989 Disorder of the skin and subcutaneous tissue, unspecified: Secondary | ICD-10-CM | POA: Diagnosis not present

## 2015-10-07 DIAGNOSIS — G8191 Hemiplegia, unspecified affecting right dominant side: Secondary | ICD-10-CM | POA: Diagnosis not present

## 2015-10-07 LAB — LIPID PANEL
Cholesterol: 132 mg/dL (ref 0–200)
HDL: 33 mg/dL — ABNORMAL LOW (ref >40)
LDL CALC: 87 mg/dL (ref 0–99)
TRIGLYCERIDES: 62 mg/dL (ref <150)
Total CHOL/HDL Ratio: 4 RATIO
VLDL: 12 mg/dL (ref 0–40)

## 2015-10-07 LAB — PROTIME-INR
INR: 2.59 — AB (ref 0.00–1.49)
Prothrombin Time: 27.4 seconds — ABNORMAL HIGH (ref 11.6–15.2)

## 2015-10-07 NOTE — Progress Notes (Signed)
Patient ID: Scio DesanctisScoville T Bolger, male   DOB: Apr 26, 1946, 70 y.o.   MRN: [id number]                     This is an acute-  visit.  Level of care skilled.  [facility]   Chief complaint- Acute visit secondary to left leg skin lesion-also complaint of right arm weakness--anticoagulation management   .  History of present illness.  Patient is a pleasant 70 year old male who has been quite stable he does have a history of CVA with right-sided weakness he is also on chronic Coumadin with a history of DVT pulmonary embolism and CVA.  INR was recently somewhat subtherapeutic and we have increased his Coumadin up to 4 mg a day and this appears to have risen into normal range was 2.11 on April 14 and today it is 2.59.  Patient also apparently recently has complained of some right arm weakness-at times he has complained of upper extremity weakness and exams of been fairly benign and that was the case today as well as.  Strength appears to be intact but this will have to be monitored  Is not complaining of any pain numbness or distress certainly.  Persists and is also noted a small skin lesion on his inner lower right leg-he does have some venous stasis changes here I don't really see any sign of infection or tenderness in fact he didn't realize it was clear nursing staff just noted a small raised area-appears to be somewhat fluid-related       .  He also has a history of borderline hyperlipidemia he also has a history of fatty liver disease so this has been somewhat conservative treatment recent lipid panel done today actually shows improvement with an LDL of 87 cholesterol 132 triglyceride 62 and HDL 33      Family medical social history has been reviewed per history and physical on 11/05/2009   [facility]  this includes Coumadin 4 mg daily.   .  Labetalol 200 mg daily.  Lisinopril 20 mg daily  Artificial tears twice a day.  Guaifenesin  DM 10 mL every 6 hours when necessary duo nebs every 6 hours when necessary and routinely twice a day.  Lactulose 15 mL twice a day milk of magnesia daily when necessary.  Senokot S daily.  Tylenol 650 mg every 6 hours when necessary.  Benadryl 12.5 mg every 6 hours when necessary.    . :.  Review of systems.  As stated in history of present illness.  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   Respiratory-denies any shortness of breath or cough.  Cardiac-no complaints of chest pain.   Muscle skeletal-he does have some right-sided weakness status post CVA this is baseline and unchanged.--Patient had complain of some increased weakness apparently recently but could not really pick up on this during exam  Neurologic-- does not complain of headache or dizziness  t  Physical exam.     skin is warm and dry--- on inner lower left leg there is a small area that is somewhat raised and appears to be somewhat fluid consistency-this appears to be more of a venous stasis issue it does not appear to be infected it is not tender not warm or erythematous significantly    .  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  Labs  10/07/2015.  Cholesterol 132 triglyceride 62 HDL 33 LDL 87.  INR 2.59.    09/12/2015.  Sodium 142 potassium 4 BUN 18 creatinine 1.073  Albumin 3.2 otherwise liver function tests within normal limits.  WBC 8.0 hemoglobin 14.5 platelets 292.    06/01/2015.  INR 2.9 again had been up to 3.85 on December 9.     02/24/2015.  Sodium 137 potassium 4 BUN 21 creatinine 1.09.  WBC 8.2 hemoglobin 14.6 platelets 268.    02/10/2015.  INR 2.0 7  12/04/2014.  Sodium 138 potassium 3.9 BUN 16 creatinine 1.10 CO2 level XXX.  Albumin 3.2 otherwise liver function tests within normal limits.  WBC 7.7 hemoglobin 15.2 platelets 229   11/06/2014.  INR-2.37.  10/09/2014.  Cholesterol 156-triglycerides 61-HDL 34-LDL 110.  Sodium 139 potassium 4.2 BUN 17 creatinine 1.1.  WBC 7.7 hemoglobin 15.2 platelets 229.     10/07/2014.  INR 2.17.  07/31/2014.  Sodium 140 potassium 3.8 BUN 16 creatinine 1.15-liver function tests within normal limits except albumin of 3.3.  WBC 8.5 hemoglobin 14.7 platelets 280.    09/06/2014.  INR 2.05.  08/23/2014.  INR 2.5.  02/10/206  Sodium 140 potassium 3.8 BUN 16 creatinine 1.15.  Albumin 3.3 otherwise liver function tests within normal limits.  WBC 8.5 hemoglobin 14.7 platelets 280.    Jan 23rd  2016.  INR-2.02.  03/21/2014.  WBC 11.0 hemoglobin 15.1 platelets 280.  Sodium 140 potassium 4.4 BUN 20 creatinine 1.22.  Liver function tests within normal limits except albumin of 3.1.     03/20/2014.  INR 2.69       .  Assessment and plan.   Number -1--left leg lesion-at this point I do not see any sign of cellulitis this was discussed with nursing they will apply Tegaderm and monitor this for any changes this could be venous stasis related.  #2-history of anticoagulation with Coumadin with history of CVA DVT and pulmonary embolism-INR recently has become therapeutic currently 2.59 on 4 mg of Coumadin will recheck this on Friday to ensure stability.  #3 history of right arm weakness-physical exam was quite benign strength appears to be intact-at this point will monitor he's  not having any pain patient at times will complain of this and then resolves-    #4-hyperlipidemia-again conservative here secondary to history of fatty liver disease- LDL now is 24 which has shown improvement-currently on no therapy      [phone]

## 2015-10-10 ENCOUNTER — Encounter (HOSPITAL_COMMUNITY)
Admission: RE | Admit: 2015-10-10 | Discharge: 2015-10-10 | Disposition: A | Discharge status: Home / Self Care | Payer: Medicare Other | Source: Skilled Nursing Facility | Attending: Internal Medicine | Admitting: Internal Medicine

## 2015-10-10 DIAGNOSIS — Z7901 Long term (current) use of anticoagulants: Secondary | ICD-10-CM | POA: Diagnosis not present

## 2015-10-10 DIAGNOSIS — G8191 Hemiplegia, unspecified affecting right dominant side: Secondary | ICD-10-CM | POA: Diagnosis not present

## 2015-10-10 DIAGNOSIS — R293 Abnormal posture: Secondary | ICD-10-CM | POA: Diagnosis not present

## 2015-10-10 DIAGNOSIS — I10 Essential (primary) hypertension: Secondary | ICD-10-CM | POA: Diagnosis not present

## 2015-10-10 DIAGNOSIS — I69998 Other sequelae following unspecified cerebrovascular disease: Secondary | ICD-10-CM | POA: Diagnosis not present

## 2015-10-10 LAB — PROTIME-INR
INR: 2.29 — AB (ref 0.00–1.49)
Prothrombin Time: 25 seconds — ABNORMAL HIGH (ref 11.6–15.2)

## 2015-10-17 ENCOUNTER — Encounter (HOSPITAL_COMMUNITY)
Admission: RE | Admit: 2015-10-17 | Discharge: 2015-10-17 | Disposition: A | Discharge status: Home / Self Care | Payer: Medicare Other | Source: Skilled Nursing Facility | Attending: Internal Medicine | Admitting: Internal Medicine

## 2015-10-17 DIAGNOSIS — I69998 Other sequelae following unspecified cerebrovascular disease: Secondary | ICD-10-CM | POA: Diagnosis not present

## 2015-10-17 DIAGNOSIS — G8191 Hemiplegia, unspecified affecting right dominant side: Secondary | ICD-10-CM | POA: Diagnosis not present

## 2015-10-17 DIAGNOSIS — R293 Abnormal posture: Secondary | ICD-10-CM | POA: Diagnosis not present

## 2015-10-17 DIAGNOSIS — I10 Essential (primary) hypertension: Secondary | ICD-10-CM | POA: Diagnosis not present

## 2015-10-17 DIAGNOSIS — Z7901 Long term (current) use of anticoagulants: Secondary | ICD-10-CM | POA: Diagnosis not present

## 2015-10-17 LAB — PROTIME-INR
INR: 2.69 — AB (ref 0.00–1.49)
PROTHROMBIN TIME: 28.2 s — AB (ref 11.6–15.2)

## 2015-10-24 ENCOUNTER — Encounter (HOSPITAL_COMMUNITY)
Admission: RE | Admit: 2015-10-24 | Discharge: 2015-10-24 | Disposition: A | Discharge status: Home / Self Care | Payer: Medicare Other | Source: Skilled Nursing Facility | Attending: Internal Medicine | Admitting: Internal Medicine

## 2015-10-24 DIAGNOSIS — I10 Essential (primary) hypertension: Secondary | ICD-10-CM | POA: Insufficient documentation

## 2015-10-24 DIAGNOSIS — G8191 Hemiplegia, unspecified affecting right dominant side: Secondary | ICD-10-CM | POA: Diagnosis not present

## 2015-10-24 DIAGNOSIS — R293 Abnormal posture: Secondary | ICD-10-CM | POA: Diagnosis not present

## 2015-10-24 DIAGNOSIS — I69998 Other sequelae following unspecified cerebrovascular disease: Secondary | ICD-10-CM | POA: Diagnosis not present

## 2015-10-24 DIAGNOSIS — Z7901 Long term (current) use of anticoagulants: Secondary | ICD-10-CM | POA: Diagnosis not present

## 2015-10-24 LAB — PROTIME-INR
INR: 2.08 — ABNORMAL HIGH (ref 0.00–1.49)
PROTHROMBIN TIME: 23.3 s — AB (ref 11.6–15.2)

## 2015-10-31 ENCOUNTER — Encounter: Payer: Self-pay | Admitting: Internal Medicine

## 2015-10-31 ENCOUNTER — Non-Acute Institutional Stay (SKILLED_NURSING_FACILITY): Payer: Medicare Other | Admitting: Internal Medicine

## 2015-10-31 ENCOUNTER — Encounter (HOSPITAL_COMMUNITY)
Admission: AD | Admit: 2015-10-31 | Discharge: 2015-10-31 | Disposition: A | Discharge status: Home / Self Care | Payer: Medicare Other | Source: Skilled Nursing Facility | Attending: *Deleted | Admitting: *Deleted

## 2015-10-31 DIAGNOSIS — I69998 Other sequelae following unspecified cerebrovascular disease: Secondary | ICD-10-CM | POA: Diagnosis not present

## 2015-10-31 DIAGNOSIS — G8191 Hemiplegia, unspecified affecting right dominant side: Secondary | ICD-10-CM | POA: Diagnosis not present

## 2015-10-31 DIAGNOSIS — Z7901 Long term (current) use of anticoagulants: Secondary | ICD-10-CM | POA: Diagnosis not present

## 2015-10-31 DIAGNOSIS — I2782 Chronic pulmonary embolism: Secondary | ICD-10-CM

## 2015-10-31 DIAGNOSIS — R293 Abnormal posture: Secondary | ICD-10-CM | POA: Diagnosis not present

## 2015-10-31 DIAGNOSIS — I2609 Other pulmonary embolism with acute cor pulmonale: Secondary | ICD-10-CM

## 2015-10-31 DIAGNOSIS — I10 Essential (primary) hypertension: Secondary | ICD-10-CM | POA: Diagnosis not present

## 2015-10-31 DIAGNOSIS — I639 Cerebral infarction, unspecified: Secondary | ICD-10-CM | POA: Diagnosis not present

## 2015-10-31 LAB — PROTIME-INR
INR: 1.91 — AB (ref 0.00–1.49)
Prothrombin Time: 21.8 seconds — ABNORMAL HIGH (ref 11.6–15.2)

## 2015-10-31 NOTE — Progress Notes (Signed)
Location:  [facility] Room Number: 112/W Place of Service:  SNF [phone](31) Provider:  Edmon CrapeArlo Maylani Embree  Terald SleeperOBSON,MICHAEL GAVIN, MD  Patient Care Team: Maxwell CaulMichael G Robson, MD as PCP - General (Internal Medicine)  Extended Emergency Contact Information Primary Emergency Contact: Venetia MaxonWalker,[name] Jr Address: [address]          [place], [place] [address] [place] Home Phone: [phone] Work Phone: [phone] Relation: Brother  Goals of care: Advanced Directive information Advanced Directives 10/31/2015  Does patient have an advance directive? Yes  Type of Advance Directive -  Does patient want to make changes to advanced directive? No - Patient declined  Copy of advanced directive(s) in chart? Yes     Chief Complaint  Patient presents with  . Acute Visit    For anticoagulants  With history of CVA-DVT-pulmonary embolism  HPI:  Pt is a 70 y.o. 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  He is currently on 4 mg of Coumadin the day-INR today is 1.96-last week it was 2.08 and 2 weeks ago was 2.69.  Clinically he appears to be stable usually is quite stable with his Coumadin dosing as well but lately we've had to make some minor adjustments.  He does not report any increase shortness of breath chest pain bruising or bleeding appears to be at his baseline Past Medical History  Diagnosis Date  . Stroke (HCC)   . Fatty liver   . Pneumonia   . Dysphasia   . Pulmonary embolism (HCC)   . Hypertension    Past Surgical History  Procedure Laterality Date  . Unable      Allergies  Allergen Reactions  . Penicillins     Current Outpatient Prescriptions on File Prior to Visit  Medication Sig Dispense Refill  . Eyelid Cleansers (OCUSOFT EYELID CLEANSING EX) Apply topically. OU two times daily before administering eye drops    . labetalol (NORMODYNE) 200  MG tablet Take 200 mg by mouth daily. For HTN    . lactulose (CHRONULAC) 10 GM/15ML solution Give 15cc by mouth twice daily For constipation    . Liniments (BEN GAY EX) Apply to affected area as needed for pain    . lisinopril (PRINIVIL,ZESTRIL) 10 MG tablet Take 20 mg by mouth daily. For HTN    . sennosides-docusate sodium (SENOKOT-S) 8.6-50 MG tablet Take 1 tablet by mouth at bedtime. For constipation     No current facility-administered medications on file prior to visit.      Review of Systems   General no complaints of fever or chills.  Skin does not report any increased bruising or bleeding.  Eyes does not complain of visual changes Respiratory shortness breath or cough.  Cardiac no chest pain baseline lower extremity edema.  GI does not complain nausea vomiting diarrhea constipation or abdominal discomfort.  Neurologic continues with some right-sided weakness upper and lower extremities with some contracture of his right hand this is baseline does not complain of headache or dizziness.  Psych is not complaining of depression or anxiety continues to ambulate about the facility in his wheelchair  Immunization History  Administered Date(s) Administered  . Influenza-Unspecified 03/28/2014   Pertinent  Health Maintenance Due  Topic Date Due  . PNA vac Low Risk Adult (1 of 2 - PCV13) 10/30/2016 (Originally 11/08/2010)  . COLONOSCOPY  10/30/2025 (Originally 11/08/1995)  . INFLUENZA VACCINE  01/20/2016  No flowsheet data found. Functional Status Survey:    Filed Vitals:   10/31/15 1521  BP: 137/80  Pulse: 80  Temp: 95.9 F (35.5 C)  TempSrc: Oral  Resp: 24  Height:  (1.778 m)  Weight: 213 lb 6.4 oz (96.798 kg)   Body mass index is 30.62 kg/(m^2). Physical Exam   In general this is a pleasant elderly male in no distress.  His skin is warm and dry.  I do not see any evidence of increased bruising or bleeding.  Oropharynx clear mucous membranes  moist.  Chest some slight expiratory rhonchi this is baseline otherwise clear to auscultation no labored breathing.  Heart is regular rate and rhythm somewhat distant heart sounds without murmur gallop or rub has mild baseline lower extremity edema.  Abdomen soft nontender protuberant positive bowel soundsbaseline as well.  Neurologic is grossly intact continues with right-sided weakness which is not new. With some dysarthric speech which is baseline    Psych continues to be alert and oriented pleasant and appropriate  Labs reviewed:  Recent Labs  02/24/15 0625 06/04/15 0715 09/12/15 09/12/15 0715  NA 137 139 142 142  K 4.0 3.8  --  4.0  CL 104 104  --  105  CO2 30 29  --  29  GLUCOSE 79 78  --  76  BUN 21* CREATININE 1.09 1.17 1.1 1.07  CALCIUM 8.3* 8.4*  --  8.5*    Recent Labs  12/04/14 0745 06/04/15 0715 09/12/15 0715  AST 40 24 20  ALT 56 27 22  ALKPHOS 42 47 48  BILITOT 0.7 0.7 0.6  PROT 6.7 7.1 6.9  ALBUMIN 3.2* 3.2* 3.2*    Recent Labs  02/24/15 0625 06/04/15 0715 09/12/15 09/12/15 0715  WBC 8.2 8.3 8.0 8.0  NEUTROABS 4.9 4.7 63 4.9  HGB 14.6 15.6  --  14.5  HCT 44.6 47.2  --  44.9  MCV 93.9 93.5  --  94.5  PLT 268 277  --  292   No results found for: TSH Lab Results  Component Value Date   HGBA1C  03/27/2009    6.0 (NOTE) The ADA recommends the following therapeutic goal for glycemic control related to Hgb A1c measurement: Goal of therapy: <6.5 Hgb A1c  Reference: American Diabetes Association: Clinical Practice Recommendations 2010, Diabetes Care, 2010, 33: (Suppl  1).   Lab Results  Component Value Date   CHOL 132 10/07/2015   HDL 33* 10/07/2015   LDLCALC 87 10/07/2015   TRIG 62 10/07/2015   CHOLHDL 4.0 10/07/2015    Significant Diagnostic Results in last 30 days:  No results found.  Assessment/Plan #1-history of CVA-with distant history DVT and pulmonary embolism-this appears to be stable he is on Coumadin for  anticoagulation continues with baseline right-sided weakness and dysarthric speech-INR slightly subtherapeutic at 1.96 will increase Coumadin slightly up to 4.5 mg on Monday Wednesday Friday continue 4 mg a day all other days-recheck PT/INR in 1 week.  [id number]      London Sheer, CMA [phone]

## 2015-11-07 ENCOUNTER — Encounter (HOSPITAL_COMMUNITY)
Admission: RE | Admit: 2015-11-07 | Discharge: 2015-11-07 | Disposition: A | Discharge status: Home / Self Care | Payer: Medicare Other | Source: Skilled Nursing Facility | Attending: *Deleted | Admitting: *Deleted

## 2015-11-07 DIAGNOSIS — G8191 Hemiplegia, unspecified affecting right dominant side: Secondary | ICD-10-CM | POA: Diagnosis not present

## 2015-11-07 DIAGNOSIS — I10 Essential (primary) hypertension: Secondary | ICD-10-CM | POA: Diagnosis not present

## 2015-11-07 DIAGNOSIS — R293 Abnormal posture: Secondary | ICD-10-CM | POA: Diagnosis not present

## 2015-11-07 DIAGNOSIS — I69998 Other sequelae following unspecified cerebrovascular disease: Secondary | ICD-10-CM | POA: Diagnosis not present

## 2015-11-07 DIAGNOSIS — Z7901 Long term (current) use of anticoagulants: Secondary | ICD-10-CM | POA: Diagnosis not present

## 2015-11-07 LAB — PROTIME-INR
INR: 2.22 — ABNORMAL HIGH (ref 0.00–1.49)
Prothrombin Time: 24.4 seconds — ABNORMAL HIGH (ref 11.6–15.2)

## 2015-11-14 ENCOUNTER — Encounter (HOSPITAL_COMMUNITY)
Admission: RE | Admit: 2015-11-14 | Discharge: 2015-11-14 | Disposition: A | Discharge status: Home / Self Care | Payer: Medicare Other | Source: Skilled Nursing Facility | Attending: *Deleted | Admitting: *Deleted

## 2015-11-14 ENCOUNTER — Non-Acute Institutional Stay (SKILLED_NURSING_FACILITY): Payer: Medicare Other | Admitting: Internal Medicine

## 2015-11-14 DIAGNOSIS — Z7901 Long term (current) use of anticoagulants: Secondary | ICD-10-CM

## 2015-11-14 DIAGNOSIS — G8191 Hemiplegia, unspecified affecting right dominant side: Secondary | ICD-10-CM | POA: Diagnosis not present

## 2015-11-14 DIAGNOSIS — I69998 Other sequelae following unspecified cerebrovascular disease: Secondary | ICD-10-CM | POA: Diagnosis not present

## 2015-11-14 DIAGNOSIS — I638 Other cerebral infarction: Secondary | ICD-10-CM

## 2015-11-14 DIAGNOSIS — I10 Essential (primary) hypertension: Secondary | ICD-10-CM | POA: Diagnosis not present

## 2015-11-14 DIAGNOSIS — I2782 Chronic pulmonary embolism: Secondary | ICD-10-CM

## 2015-11-14 DIAGNOSIS — I6389 Other cerebral infarction: Secondary | ICD-10-CM

## 2015-11-14 DIAGNOSIS — R293 Abnormal posture: Secondary | ICD-10-CM | POA: Diagnosis not present

## 2015-11-14 LAB — PROTIME-INR
INR: 2.93 — AB (ref 0.00–1.49)
Prothrombin Time: 30.1 seconds — ABNORMAL HIGH (ref 11.6–15.2)

## 2015-11-14 NOTE — Progress Notes (Signed)
Patient ID: [name] [name], male   DOB: [birth year], 70 y.o.   MRN: [id number]  Location:  [facility]   Place of Service:  SNF [phone](31) Provider:  Edmon CrapeArlo Dossie Swor  Terald SleeperOBSON,MICHAEL GAVIN, MD  Patient Care Team: Maxwell CaulMichael G Robson, MD as PCP - General (Internal Medicine)  Extended Emergency Contact Information Primary Emergency Contact: Venetia MaxonWalker,[name] Jr Address: [address]          [place], [place] [address] [place] Home Phone: [phone] Work Phone: [phone] Relation: Brother  Goals of care: Advanced Directive information Advanced Directives 10/31/2015  Does patient have an advance directive? Yes  Type of Advance Directive -  Does patient want to make changes to advanced directive? No - Patient declined  Copy of advanced directive(s) in chart? Yes     Chief Complaint  Patient presents with  . Acute Visit  Anticoagulation management with history of CVA-DVT-pulmonary embolism  HPI:  Pt is a 70 y.o. 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  He is currently on 4 mg of Coumadin 4 days a week and 4.5 mg on Monday Wednesday and Friday.  This was secondary to an INR of 1.96--2 weeks ago.  INR has slowly risen and is now up to 2.93 on lab done today  Clinically he appears to be stable usually is quite stable with his Coumadin dosing as well but lately we've had to make some  adjustments.  He does not report any increase shortness of breath chest pain bruising or bleeding appears to be at his baseline Past Medical History  Diagnosis Date  . Stroke (HCC)   . Fatty liver   . Pneumonia   . Dysphasia   . Pulmonary embolism (HCC)   . Hypertension    Past Surgical History  Procedure Laterality Date  . Unable      Allergies  Allergen Reactions  . Penicillins     Current Outpatient Prescriptions on File Prior to Visit  Medication Sig Dispense Refill    . Artificial Tear Ointment (REFRESH LACRI-LUBE OP) Apply thin layer to left eye at bedtime    . Eyelid Cleansers (OCUSOFT EYELID CLEANSING EX) Apply topically. OU two times daily before administering eye drops    . Hypromellose 0.4 % SOLN Artificial tears  Apply one drop in both eyes twice a day    . ipratropium-albuterol (DUONEB) 0.5-2.5 (3) MG/3ML SOLN 1 unit dose vial twice a day VIA NEBULIZER (DX; CHRONIC HYPOXIA )    . labetalol (NORMODYNE) 200 MG tablet Take 200 mg by mouth daily. For HTN    . lactulose (CHRONULAC) 10 GM/15ML solution Give 15cc by mouth twice daily For constipation    . Liniments (BEN GAY EX) Apply to affected area as needed for pain    . lisinopril (PRINIVIL,ZESTRIL) 10 MG tablet Take 20 mg by mouth daily. For HTN    . sennosides-docusate sodium (SENOKOT-S) 8.6-50 MG tablet Take 1 tablet by mouth at bedtime. For constipation    . Vitamins A & D (VITAMIN A & D) ointment Apply to bilat feet and legs q day prn. Once a day    . warfarin (COUMADIN) 4 MG tablet 4 mg. 4 mg by mouth daily     No current facility-administered medications on file prior to visit.      Review of Systems   General no complaints of fever or chills.  Skin does  not report any increased bruising or bleeding.  Eyes does not complain of visual changes Respiratory shortness breath or cough.  Cardiac no chest pain baseline lower extremity edema.  GI does not complain nausea vomiting diarrhea constipation or abdominal discomfort.  Neurologic continues with some right-sided weakness upper and lower extremities with some contracture of his right hand this is baseline does not complain of headache or dizziness.  Psych is not complaining of depression or anxiety continues to ambulate about the facility in his wheelchair  Immunization History  Administered Date(s) Administered  . Influenza-Unspecified 03/28/2014   Pertinent  Health Maintenance Due  Topic Date Due  . PNA vac Low Risk Adult (1 of  2 - PCV13) 10/30/2016 (Originally 11/08/2010)  . COLONOSCOPY  10/30/2025 (Originally 11/08/1995)  . INFLUENZA VACCINE  01/20/2016   No flowsheet data found. Functional Status Survey:    Filed Vitals:   11/16/15 0829  BP: 130/93  Pulse: 82  Temp: 98.5 F (36.9 C)  Resp: 24   There is no weight on file to calculate BMI. Physical Exam   In general this is a pleasant elderly male in no distress.  His skin is warm and dry.  I do not see any evidence of increased bruising or bleeding.  Oropharynx clear mucous membranes moist.  Chest some slight expiratory rhonchi this is baseline and clears somewhat with cough otherwise clear to auscultation no labored breathing.  Heart is regular rate and rhythm somewhat distant heart sounds without murmur gallop or rub has mild baseline lower extremity edema.  Abdomen soft nontender protuberant positive bowel soundsbaseline as well.  Neurologic is grossly intact continues with right-sided weakness which is not new. With some dysarthric speech which is baseline    Psych continues to be alert and oriented pleasant and appropriate  Labs reviewed:  11/14/2015.  INR 2.93.  11/07/2015-INR 2.22.  Previous INR 2 weeks ago was 1.91    Recent Labs  02/24/15 0625 06/04/15 0715 09/12/15 09/12/15 0715  NA 137 139 142 142  K 4.0 3.8  --  4.0  CL 104 104  --  105  CO2 30 29  --  29  GLUCOSE 79 78  --  76  BUN 21* 15 18 18   CREATININE 1.09 1.17 1.1 1.07  CALCIUM 8.3* 8.4*  --  8.5*    Recent Labs  12/04/14 0745 06/04/15 0715 09/12/15 0715  AST 40 24 20  ALT 56 27 22  ALKPHOS 42 47 48  BILITOT 0.7 0.7 0.6  PROT 6.7 7.1 6.9  ALBUMIN 3.2* 3.2* 3.2*    Recent Labs  02/24/15 0625 06/04/15 0715 09/12/15 09/12/15 0715  WBC 8.2 8.3 8.0 8.0  NEUTROABS 4.9 4.7 63 4.9  HGB 14.6 15.6  --  14.5  HCT 44.6 47.2  --  44.9  MCV 93.9 93.5  --  94.5  PLT 268 277  --  292   No results found for: TSH Lab Results  Component Value Date    HGBA1C  03/27/2009    6.0 (NOTE) The ADA recommends the following therapeutic goal for glycemic control related to Hgb A1c measurement: Goal of therapy: <6.5 Hgb A1c  Reference: American Diabetes Association: Clinical Practice Recommendations 2010, Diabetes Care, 2010, 33: (Suppl  1).   Lab Results  Component Value Date   CHOL 132 10/07/2015   HDL 33* 10/07/2015   LDLCALC 87 10/07/2015   TRIG 62 10/07/2015   CHOLHDL 4.0 10/07/2015    Significant Diagnostic Results in last 30  days:  No results found.  Assessment/Plan #1-history of CVA-with distant history DVT and pulmonary embolism-this appears to be stable he is on Coumadin for anticoagulation continues with baseline right-sided weakness and dysarthric speech-INR was slightly subtherapeutic and minor adjustments were made however now INR is high normal-we will reduce Coumadin dose back to 4 mg a day and update a PT INR early next week.    [id number]      Edmon Crape, PA-C [phone]

## 2015-11-16 ENCOUNTER — Encounter: Payer: Self-pay | Admitting: Internal Medicine

## 2015-11-17 ENCOUNTER — Other Ambulatory Visit (HOSPITAL_COMMUNITY)
Admission: RE | Admit: 2015-11-17 | Discharge: 2015-11-17 | Disposition: A | Discharge status: Home / Self Care | Payer: Medicare Other | Source: Skilled Nursing Facility | Attending: Internal Medicine | Admitting: Internal Medicine

## 2015-11-17 DIAGNOSIS — Z7901 Long term (current) use of anticoagulants: Secondary | ICD-10-CM | POA: Insufficient documentation

## 2015-11-17 LAB — PROTIME-INR
INR: 2.87 — ABNORMAL HIGH (ref 0.00–1.49)
Prothrombin Time: 29.6 seconds — ABNORMAL HIGH (ref 11.6–15.2)

## 2015-11-24 ENCOUNTER — Encounter (HOSPITAL_COMMUNITY)
Admission: RE | Admit: 2015-11-24 | Discharge: 2015-11-24 | Disposition: A | Discharge status: Home / Self Care | Payer: Medicare Other | Source: Skilled Nursing Facility | Attending: Internal Medicine | Admitting: Internal Medicine

## 2015-11-24 DIAGNOSIS — I10 Essential (primary) hypertension: Secondary | ICD-10-CM | POA: Diagnosis not present

## 2015-11-24 DIAGNOSIS — Z7901 Long term (current) use of anticoagulants: Secondary | ICD-10-CM | POA: Diagnosis not present

## 2015-11-24 DIAGNOSIS — R293 Abnormal posture: Secondary | ICD-10-CM | POA: Diagnosis not present

## 2015-11-24 DIAGNOSIS — I69998 Other sequelae following unspecified cerebrovascular disease: Secondary | ICD-10-CM | POA: Insufficient documentation

## 2015-11-24 DIAGNOSIS — G8191 Hemiplegia, unspecified affecting right dominant side: Secondary | ICD-10-CM | POA: Insufficient documentation

## 2015-11-24 LAB — PROTIME-INR
INR: 2.14 — AB (ref 0.00–1.49)
PROTHROMBIN TIME: 23.8 s — AB (ref 11.6–15.2)

## 2015-12-01 ENCOUNTER — Other Ambulatory Visit (HOSPITAL_COMMUNITY)
Admission: RE | Admit: 2015-12-01 | Discharge: 2015-12-01 | Disposition: A | Discharge status: Home / Self Care | Payer: Medicare Other | Source: Skilled Nursing Facility | Attending: Internal Medicine | Admitting: Internal Medicine

## 2015-12-01 DIAGNOSIS — I10 Essential (primary) hypertension: Secondary | ICD-10-CM | POA: Diagnosis not present

## 2015-12-01 LAB — PROTIME-INR
INR: 2.59 — AB (ref 0.00–1.49)
PROTHROMBIN TIME: 27.4 s — AB (ref 11.6–15.2)

## 2015-12-08 ENCOUNTER — Encounter (HOSPITAL_COMMUNITY)
Admission: RE | Admit: 2015-12-08 | Discharge: 2015-12-08 | Disposition: A | Discharge status: Home / Self Care | Payer: Medicare Other | Source: Skilled Nursing Facility | Attending: *Deleted | Admitting: *Deleted

## 2015-12-08 DIAGNOSIS — I10 Essential (primary) hypertension: Secondary | ICD-10-CM | POA: Diagnosis not present

## 2015-12-08 DIAGNOSIS — R293 Abnormal posture: Secondary | ICD-10-CM | POA: Diagnosis not present

## 2015-12-08 DIAGNOSIS — Z7901 Long term (current) use of anticoagulants: Secondary | ICD-10-CM | POA: Diagnosis not present

## 2015-12-08 DIAGNOSIS — G8191 Hemiplegia, unspecified affecting right dominant side: Secondary | ICD-10-CM | POA: Diagnosis not present

## 2015-12-08 DIAGNOSIS — I69998 Other sequelae following unspecified cerebrovascular disease: Secondary | ICD-10-CM | POA: Diagnosis not present

## 2015-12-08 LAB — PROTIME-INR
INR: 2.69 — ABNORMAL HIGH (ref 0.00–1.49)
PROTHROMBIN TIME: 28.2 s — AB (ref 11.6–15.2)

## 2015-12-18 ENCOUNTER — Non-Acute Institutional Stay (SKILLED_NURSING_FACILITY): Payer: Medicare Other | Admitting: Internal Medicine

## 2015-12-18 ENCOUNTER — Encounter: Payer: Self-pay | Admitting: Internal Medicine

## 2015-12-18 DIAGNOSIS — Z7901 Long term (current) use of anticoagulants: Secondary | ICD-10-CM

## 2015-12-18 DIAGNOSIS — Z8673 Personal history of transient ischemic attack (TIA), and cerebral infarction without residual deficits: Secondary | ICD-10-CM

## 2015-12-18 DIAGNOSIS — I10 Essential (primary) hypertension: Secondary | ICD-10-CM

## 2015-12-18 NOTE — Progress Notes (Signed)
    Facility Location: [facility] Room Number: 112-W   This is a nursing facility follow up for of chronic medical diagnoses  Interim medical record and care since last [facility] visit was updated with review of diagnostic studies and change in clinical status since last visit were documented.   HPI: The patient has medical diagnoses of hypertension, prior cerebrovascular accident, and history of deep venous thrombosis and pulmonary embolism. He is on warfarin chronic anticoagulation. PT/INR ranges over the last month have been 2.14-2.93. Lipids are current. On 4/18 LDL was 87 and HDL 33. He is not on a statin. Annual monitor was recommended based on stability over the last year. He's had mildly decreased albumin and calcium levels. Most recent labs were 3/24. CBC and differential at that time was normal. No TSH on record.  Comprehensive review of systems is completely negative except for decreased hearing. Constitutional: No fever,significant weight change, fatigue  Eyes: No redness, discharge, pain, vision change ENT/mouth: No nasal congestion,  purulent discharge, earache,change in hearing ,sore throat  Cardiovascular: No chest pain, palpitations,paroxysmal nocturnal dyspnea, claudication, edema  Respiratory: No cough, sputum production,hemoptysis, DOE , significant snoring,apnea  Gastrointestinal: No heartburn,dysphagia,abdominal pain, nausea / vomiting,rectal bleeding, melena,change in bowels Genitourinary: No dysuria,hematuria, pyuria,  incontinence, nocturia Musculoskeletal: No joint stiffness, joint swelling, weakness,pain Dermatologic: No rash, pruritus, change in appearance of skin Neurologic: No dizziness,headache,syncope, seizures, numbness , tingling Psychiatric: No significant anxiety , depression, insomnia, anorexia Endocrine: No change in hair/skin/ nails, excessive thirst, excessive hunger, excessive urination  Hematologic/lymphatic: No significant  bruising, lymphadenopathy,abnormal bleeding Allergy/immunology: No itchy/ watery eyes, significant sneezing, urticaria, angioedema  Physical exam:  Pertinent or positive findings: He is in a wheelchair. His speech is garbled and slurred. Facial asymmetry is present with sagging on the left.  There is significant ptosis on the left. There is also esotropia on the left. He has an upper plate. Hearing is indeed decreased. His hair is very long. He has a mustache. There is decreased range of motion of the right lower and right upper extremity. He has flexion contractures of the right hand he has 1/2+ edema at the sock line.  General appearance:Adequately nourished; no acute distress , increased work of breathing is present.   Lymphatic: No lymphadenopathy about the head, neck, axilla . Ears:  External ear exam shows no significant lesions or deformities.   Nose:  External nasal examination shows no deformity or inflammation. Nasal mucosa are pink and moist without lesions ,exudates Oral exam: lips and gums are healthy appearing. Neck:  No thyromegaly, masses, tenderness noted.    Heart:  Normal rate and regular rhythm. S1 and S2 normal without gallop, murmur, click, rub .  Lungs:Chest clear to auscultation without wheezes, rhonchi,rales , rubs. He does exhibit some upper airway congestion. Abdomen:Bowel sounds are normal. Abdomen is soft and nontender with no organomegaly, hernias,masses. GU: deferred. Extremities:  No cyanosis, clubbing  Neurologic exam : Balance,Rhomberg,finger to nose testing could not be completed due to clinical state Deep tendon reflexes are unequal, hyperreflexic in the right upper extremity Skin: Warm & dry w/o tenting. No significant lesions or rash.    See summary under each active problem in the Problem List with associated updated therapeutic plan

## 2015-12-18 NOTE — Assessment & Plan Note (Addendum)
PT/INR is being maintained in therapeutic range without significant variation in view of cost issues; novel oral anticoagulation will not be initiated CBC

## 2015-12-18 NOTE — Patient Instructions (Addendum)
New orders for Matrix entry. CBC and differential  BMET  TSH  

## 2015-12-18 NOTE — Assessment & Plan Note (Addendum)
Blood pressure adequately controlled; no change BMET

## 2015-12-18 NOTE — Assessment & Plan Note (Signed)
The patient is very mobile in his wheelchair Deficits are fixed and not progressive

## 2015-12-19 ENCOUNTER — Encounter (HOSPITAL_COMMUNITY)
Admission: RE | Admit: 2015-12-19 | Discharge: 2015-12-19 | Disposition: A | Discharge status: Home / Self Care | Payer: Medicare Other | Source: Skilled Nursing Facility | Attending: *Deleted | Admitting: *Deleted

## 2015-12-19 DIAGNOSIS — I10 Essential (primary) hypertension: Secondary | ICD-10-CM | POA: Diagnosis not present

## 2015-12-19 DIAGNOSIS — Z7901 Long term (current) use of anticoagulants: Secondary | ICD-10-CM | POA: Diagnosis not present

## 2015-12-19 DIAGNOSIS — R293 Abnormal posture: Secondary | ICD-10-CM | POA: Diagnosis not present

## 2015-12-19 DIAGNOSIS — I69998 Other sequelae following unspecified cerebrovascular disease: Secondary | ICD-10-CM | POA: Diagnosis not present

## 2015-12-19 DIAGNOSIS — G8191 Hemiplegia, unspecified affecting right dominant side: Secondary | ICD-10-CM | POA: Diagnosis not present

## 2015-12-19 LAB — BASIC METABOLIC PANEL
ANION GAP: 4 — AB (ref 5–15)
BUN: 20 mg/dL (ref 6–20)
CHLORIDE: 103 mmol/L (ref 101–111)
CO2: 30 mmol/L (ref 22–32)
CREATININE: 1.12 mg/dL (ref 0.61–1.24)
Calcium: 8.3 mg/dL — ABNORMAL LOW (ref 8.9–10.3)
GFR calc non Af Amer: >60 mL/min (ref >60)
Glucose, Bld: 75 mg/dL (ref 65–99)
Potassium: 3.8 mmol/L (ref 3.5–5.1)
SODIUM: 137 mmol/L (ref 135–145)

## 2015-12-19 LAB — CBC WITH DIFFERENTIAL/PLATELET
BASOS ABS: 0 10*3/uL (ref 0.0–0.1)
BASOS PCT: 0 %
Eosinophils Absolute: 0.4 10*3/uL (ref 0.0–0.7)
Eosinophils Relative: 4 %
HEMATOCRIT: 45.6 % (ref 39.0–52.0)
HEMOGLOBIN: 15 g/dL (ref 13.0–17.0)
LYMPHS PCT: 29 %
Lymphs Abs: 2.5 10*3/uL (ref 0.7–4.0)
MCH: 31 pg (ref 26.0–34.0)
MCHC: 32.9 g/dL (ref 30.0–36.0)
MCV: 94.2 fL (ref 78.0–100.0)
Monocytes Absolute: 0.7 10*3/uL (ref 0.1–1.0)
Monocytes Relative: 8 %
NEUTROS ABS: 5 10*3/uL (ref 1.7–7.7)
NEUTROS PCT: 59 %
Platelets: 277 10*3/uL (ref 150–400)
RBC: 4.84 MIL/uL (ref 4.22–5.81)
RDW: 13.5 % (ref 11.5–15.5)
WBC: 8.6 10*3/uL (ref 4.0–10.5)

## 2015-12-19 LAB — TSH: TSH: 1.285 u[IU]/mL (ref 0.350–4.500)

## 2015-12-22 ENCOUNTER — Encounter (HOSPITAL_COMMUNITY)
Admission: RE | Admit: 2015-12-22 | Discharge: 2015-12-22 | Disposition: A | Discharge status: Home / Self Care | Payer: Medicare Other | Source: Skilled Nursing Facility | Attending: Internal Medicine | Admitting: Internal Medicine

## 2015-12-22 DIAGNOSIS — R293 Abnormal posture: Secondary | ICD-10-CM | POA: Diagnosis not present

## 2015-12-22 DIAGNOSIS — Z7901 Long term (current) use of anticoagulants: Secondary | ICD-10-CM | POA: Diagnosis not present

## 2015-12-22 DIAGNOSIS — G8191 Hemiplegia, unspecified affecting right dominant side: Secondary | ICD-10-CM | POA: Insufficient documentation

## 2015-12-22 DIAGNOSIS — I10 Essential (primary) hypertension: Secondary | ICD-10-CM | POA: Diagnosis not present

## 2015-12-22 DIAGNOSIS — I69998 Other sequelae following unspecified cerebrovascular disease: Secondary | ICD-10-CM | POA: Insufficient documentation

## 2015-12-22 LAB — PROTIME-INR
INR: 2.04 — AB (ref 0.00–1.49)
Prothrombin Time: 22.9 seconds — ABNORMAL HIGH (ref 11.6–15.2)

## 2015-12-29 ENCOUNTER — Encounter (HOSPITAL_COMMUNITY)
Admission: RE | Admit: 2015-12-29 | Discharge: 2015-12-29 | Disposition: A | Discharge status: Home / Self Care | Payer: Medicare Other | Source: Skilled Nursing Facility | Attending: *Deleted | Admitting: *Deleted

## 2015-12-29 DIAGNOSIS — I10 Essential (primary) hypertension: Secondary | ICD-10-CM | POA: Diagnosis not present

## 2015-12-29 DIAGNOSIS — G8191 Hemiplegia, unspecified affecting right dominant side: Secondary | ICD-10-CM | POA: Diagnosis not present

## 2015-12-29 DIAGNOSIS — B351 Tinea unguium: Secondary | ICD-10-CM | POA: Diagnosis not present

## 2015-12-29 DIAGNOSIS — M79671 Pain in right foot: Secondary | ICD-10-CM | POA: Diagnosis not present

## 2015-12-29 DIAGNOSIS — M79672 Pain in left foot: Secondary | ICD-10-CM | POA: Diagnosis not present

## 2015-12-29 DIAGNOSIS — I69998 Other sequelae following unspecified cerebrovascular disease: Secondary | ICD-10-CM | POA: Diagnosis not present

## 2015-12-29 DIAGNOSIS — Z7901 Long term (current) use of anticoagulants: Secondary | ICD-10-CM | POA: Diagnosis not present

## 2015-12-29 DIAGNOSIS — R293 Abnormal posture: Secondary | ICD-10-CM | POA: Diagnosis not present

## 2015-12-29 LAB — PROTIME-INR
INR: 2.82 — AB (ref 0.00–1.49)
PROTHROMBIN TIME: 29.2 s — AB (ref 11.6–15.2)

## 2015-12-30 DIAGNOSIS — R1312 Dysphagia, oropharyngeal phase: Secondary | ICD-10-CM | POA: Diagnosis not present

## 2015-12-30 DIAGNOSIS — I10 Essential (primary) hypertension: Secondary | ICD-10-CM | POA: Diagnosis not present

## 2015-12-30 DIAGNOSIS — I69998 Other sequelae following unspecified cerebrovascular disease: Secondary | ICD-10-CM | POA: Diagnosis not present

## 2015-12-30 DIAGNOSIS — G8191 Hemiplegia, unspecified affecting right dominant side: Secondary | ICD-10-CM | POA: Diagnosis not present

## 2015-12-31 DIAGNOSIS — I10 Essential (primary) hypertension: Secondary | ICD-10-CM | POA: Diagnosis not present

## 2015-12-31 DIAGNOSIS — G8191 Hemiplegia, unspecified affecting right dominant side: Secondary | ICD-10-CM | POA: Diagnosis not present

## 2015-12-31 DIAGNOSIS — I69998 Other sequelae following unspecified cerebrovascular disease: Secondary | ICD-10-CM | POA: Diagnosis not present

## 2015-12-31 DIAGNOSIS — R1312 Dysphagia, oropharyngeal phase: Secondary | ICD-10-CM | POA: Diagnosis not present

## 2016-01-01 ENCOUNTER — Encounter (HOSPITAL_COMMUNITY)
Admission: RE | Admit: 2016-01-01 | Discharge: 2016-01-01 | Disposition: A | Discharge status: Home / Self Care | Payer: Medicare Other | Source: Skilled Nursing Facility | Attending: *Deleted | Admitting: *Deleted

## 2016-01-01 DIAGNOSIS — G8191 Hemiplegia, unspecified affecting right dominant side: Secondary | ICD-10-CM | POA: Diagnosis not present

## 2016-01-01 DIAGNOSIS — I69998 Other sequelae following unspecified cerebrovascular disease: Secondary | ICD-10-CM | POA: Diagnosis not present

## 2016-01-01 DIAGNOSIS — I10 Essential (primary) hypertension: Secondary | ICD-10-CM | POA: Diagnosis not present

## 2016-01-01 DIAGNOSIS — Z7901 Long term (current) use of anticoagulants: Secondary | ICD-10-CM | POA: Diagnosis not present

## 2016-01-01 DIAGNOSIS — R1312 Dysphagia, oropharyngeal phase: Secondary | ICD-10-CM | POA: Diagnosis not present

## 2016-01-01 DIAGNOSIS — R293 Abnormal posture: Secondary | ICD-10-CM | POA: Diagnosis not present

## 2016-01-01 LAB — PROTIME-INR
INR: 2.74 — ABNORMAL HIGH (ref 0.00–1.49)
Prothrombin Time: 28.6 seconds — ABNORMAL HIGH (ref 11.6–15.2)

## 2016-01-02 DIAGNOSIS — R1312 Dysphagia, oropharyngeal phase: Secondary | ICD-10-CM | POA: Diagnosis not present

## 2016-01-02 DIAGNOSIS — I69998 Other sequelae following unspecified cerebrovascular disease: Secondary | ICD-10-CM | POA: Diagnosis not present

## 2016-01-02 DIAGNOSIS — I10 Essential (primary) hypertension: Secondary | ICD-10-CM | POA: Diagnosis not present

## 2016-01-02 DIAGNOSIS — G8191 Hemiplegia, unspecified affecting right dominant side: Secondary | ICD-10-CM | POA: Diagnosis not present

## 2016-01-05 DIAGNOSIS — R1312 Dysphagia, oropharyngeal phase: Secondary | ICD-10-CM | POA: Diagnosis not present

## 2016-01-05 DIAGNOSIS — I69998 Other sequelae following unspecified cerebrovascular disease: Secondary | ICD-10-CM | POA: Diagnosis not present

## 2016-01-05 DIAGNOSIS — I10 Essential (primary) hypertension: Secondary | ICD-10-CM | POA: Diagnosis not present

## 2016-01-05 DIAGNOSIS — G8191 Hemiplegia, unspecified affecting right dominant side: Secondary | ICD-10-CM | POA: Diagnosis not present

## 2016-01-06 DIAGNOSIS — I69998 Other sequelae following unspecified cerebrovascular disease: Secondary | ICD-10-CM | POA: Diagnosis not present

## 2016-01-06 DIAGNOSIS — I10 Essential (primary) hypertension: Secondary | ICD-10-CM | POA: Diagnosis not present

## 2016-01-06 DIAGNOSIS — R1312 Dysphagia, oropharyngeal phase: Secondary | ICD-10-CM | POA: Diagnosis not present

## 2016-01-06 DIAGNOSIS — G8191 Hemiplegia, unspecified affecting right dominant side: Secondary | ICD-10-CM | POA: Diagnosis not present

## 2016-01-07 ENCOUNTER — Encounter: Payer: Self-pay | Admitting: Internal Medicine

## 2016-01-07 ENCOUNTER — Non-Acute Institutional Stay (SKILLED_NURSING_FACILITY): Payer: Medicare Other | Admitting: Internal Medicine

## 2016-01-07 DIAGNOSIS — K5900 Constipation, unspecified: Secondary | ICD-10-CM | POA: Diagnosis not present

## 2016-01-07 DIAGNOSIS — Z8673 Personal history of transient ischemic attack (TIA), and cerebral infarction without residual deficits: Secondary | ICD-10-CM

## 2016-01-07 DIAGNOSIS — I10 Essential (primary) hypertension: Secondary | ICD-10-CM | POA: Diagnosis not present

## 2016-01-07 DIAGNOSIS — I69998 Other sequelae following unspecified cerebrovascular disease: Secondary | ICD-10-CM | POA: Diagnosis not present

## 2016-01-07 DIAGNOSIS — G8191 Hemiplegia, unspecified affecting right dominant side: Secondary | ICD-10-CM | POA: Diagnosis not present

## 2016-01-07 DIAGNOSIS — Z7901 Long term (current) use of anticoagulants: Secondary | ICD-10-CM | POA: Diagnosis not present

## 2016-01-07 DIAGNOSIS — R1312 Dysphagia, oropharyngeal phase: Secondary | ICD-10-CM | POA: Diagnosis not present

## 2016-01-07 NOTE — Progress Notes (Signed)
Location:   [facility] Room Number: 112/W Place of Service:  SNF [phone]) Provider:  Abigail Miyamoto, MD  Patient Care Team: Pecola Lawless, MD as PCP - General (Internal Medicine)  Extended Emergency Contact Information Primary Emergency Contact: [name] Address: [address]          [place], [place] [address] [place] of [place] Home Phone: [phone] Work Phone: [phone] Relation: Brother  Code Status: Full Code  Goals of care: Advanced Directive information Advanced Directives 01/07/2016  Does patient have an advance directive? Yes  Type of Advance Directive (No Data)  Does patient want to make changes to advanced directive? No - Patient declined  Copy of advanced directive(s) in chart? Yes     Chief Complaint  Patient presents with  . Medical Management of Chronic Issues    Riutine visit    HPI:  Pt is a 70 y.o. male seen today for Medical management of chronic medical issues including history of CVA-pulmonary embolism with DVT-history of chronic anticoagulation management.  He continues to be quite stable ambulates about the facility in his wheelchair-he does have a history of hypertension blood pressure appears to be stable on when available as well as incentive pale most recently 133/87.  He is not complaining of any shortness of breath or chest pain.  In regards to anticoagulation management this is been quite stable on Coumadin most recently 2.7 for a lab done 01/01/2016 updated INR is pending.     Past Medical History  Diagnosis Date  . Stroke (HCC)   . Fatty liver   . Pneumonia   . Dysphasia   . Pulmonary embolism (HCC)   . Hypertension    Past Surgical History  Procedure Laterality Date  . Unable      Allergies  Allergen Reactions  . Penicillins     Outpatient Encounter Prescriptions as of 01/07/2016  Medication Sig  . Eyelid Cleansers (SYSTANE LID WIPES EX) Start occusoft lid scrubs: clean  eyelids of oil and debris BID OU ( each eye ) before administering eye drops.  Marland Kitchen ipratropium-albuterol (DUONEB) 0.5-2.5 (3) MG/3ML SOLN 1 unit dose vial twice a day VIA NEBULIZER (DX; CHRONIC HYPOXIA )  . labetalol (NORMODYNE) 200 MG tablet Take 200 mg by mouth daily. For HTN  . lactulose (CHRONULAC) 10 GM/15ML solution Give 15cc by mouth twice daily For constipation  . Liniments (BEN GAY EX) Apply to affected area as needed for pain  . lisinopril (PRINIVIL,ZESTRIL) 10 MG tablet Take 20 mg by mouth daily. For HTN  . Propylene Glycol-Glycerin (ARTIFICIAL TEARS) 1-0.3 % SOLN Apply to eye. 1 drop in both eyes twice daily  . sennosides-docusate sodium (SENOKOT-S) 8.6-50 MG tablet Take 1 tablet by mouth at bedtime. For constipation  . Vitamins A & D (VITAMIN A & D) ointment Apply to bilat feet and legs q day prn. Once a day  . warfarin (COUMADIN) 1 MG tablet ake 1/2 a tablet along with 3 mg tablet to equal 3.5 mg once a day.  . warfarin (COUMADIN) 3 MG tablet Take  tablet along with the 1/2 mg tablet to have 3.5 mg take once a day.  Cliffton Asters Petrolatum-Mineral Oil (SYSTANE NIGHTTIME) OINT Apply to eye. Apply to left eye at night  . [DISCONTINUED] Eyelid Cleansers (OCUSOFT EYELID CLEANSING EX) Apply topically. OU two times daily before administering eye drops  . [DISCONTINUED] warfarin (COUMADIN) 4 MG tablet 4 mg. 4 mg by mouth daily  No facility-administered encounter medications on file as of 01/07/2016.    Review of Systems   General no complaints of fever or chills.  Skin does not complaining of any increased bruising bleeding or rashes.  Eyes does not complain of visual changes from baseline.  Her straight does not complain of shortness breath or cough.  Cardiac no chest pain has mild lower extremity edema which is baseline.  GI does not complain nausea vomiting diarrhea constipation or abdominal discomfort. Muscle skeletal does not complain of joint pain currently.  Neurologic  continues with right-sided weakness which is baseline upper and lower extremities with some contractures of his right hand-does not complain of headache dizziness or syncopal-type feelings.  Psych continues to be in good spirits pleasant does not complain of depression or anxiety  Immunization History  Administered Date(s) Administered  . Influenza-Unspecified 03/28/2014  . Pneumococcal-Unspecified 11/01/2009   Pertinent  Health Maintenance Due  Topic Date Due  . PNA vac Low Risk Adult (1 of 2 - PCV13) 10/30/2016 (Originally 11/08/2010)  . COLONOSCOPY  10/30/2025 (Originally 11/08/1995)  . INFLUENZA VACCINE  01/20/2016   No flowsheet data found. Functional Status Survey:    Filed Vitals:   01/07/16 1611  BP: 133/87  Pulse: 82  Temp: 98.4 F (36.9 C)  TempSrc: Oral  Resp: 18  Height: 5\' 10"  (1.778 m)  Weight: 213 lb 9.6 oz (96.888 kg)   Body mass index is 30.65 kg/(m^2). Physical Exam   In general this is a pleasant elderly male in no distress.  His skin is warm and dry.  I do not see any evidence of increased bruising or bleeding.  Oropharynx clear mucous membranes moist.  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  Heart is regular rate and rhythm somewhat distant heart sounds without murmur gallop or rub has mild baseline lower extremity edema.  Abdomen soft nontender protuberant positive bowel soundsbaseline as well.  Muscle skeletal-continues with right-sided weakness contracture of his right hand to some extent-this is baseline strength appears preserved left of her lower extremities is ambulatory and wheelchair  Neurologic is grossly intact continues with right-sided weakness which is not new. With some dysarthric speech which is baseline    Psych continues to be alert and oriented pleasant and appropriate  Labs  reviewed:  Recent Labs  06/04/15 0715 09/12/15 09/12/15 0715 12/19/15 0735  NA 139 142 142 137  K 3.8  --  4.0 3.8  CL 104  --  105 103  CO2 29  --  29 30  GLUCOSE 78  --  76 75  BUN 15 18 18 20   CREATININE 1.17 1.1 1.07 1.12  CALCIUM 8.4*  --  8.5* 8.3*    Recent Labs  06/04/15 0715 09/12/15 0715  AST 24 20  ALT 27 22  ALKPHOS 47 48  BILITOT 0.7 0.6  PROT 7.1 6.9  ALBUMIN 3.2* 3.2*    Recent Labs  06/04/15 0715 09/12/15 09/12/15 0715 12/19/15 0735  WBC 8.3 8.0 8.0 8.6  NEUTROABS 4.7 63 4.9 5.0  HGB 15.6  --  14.5 15.0  HCT 47.2  --  44.9 45.6  MCV 93.5  --  94.5 94.2  PLT 277  --  292 277   Lab Results  Component Value Date   TSH 1.285 12/19/2015   Lab Results  Component Value Date   HGBA1C  03/27/2009    6.0 (NOTE) The ADA recommends the following therapeutic goal for glycemic control related to Hgb A1c measurement: Goal of therapy: <6.5 Hgb A1c  Reference: American Diabetes Association: Clinical Practice Recommendations 2010, Diabetes Care, 2010, 33: (Suppl  1).   Lab Results  Component Value Date   CHOL 132 10/07/2015   HDL 33* 10/07/2015   LDLCALC 87 10/07/2015   TRIG 62 10/07/2015   CHOLHDL 4.0 10/07/2015    Significant Diagnostic Results in last 30 days:  No results found.  Assessment/Plan . Number -1-- CVA --this is a baseline--he continues on Coumadin for anticoagulation-INR has been stable  Most recently 2.74 on July 13 Update INR is pending   #2-hypertension- continues on labetalol and lisinopril I do not see consistent elevations    -   #3 history DVT-again continues on anticoagulation as noted above--update INR is pending  #4-history of pneumonia-this is a stable he is on routine nebulizers as well as when necessary and is on a when necessary expectorant  #5-constipation-this appears stable current medications.  He is on lactulose.  #6-hyperlipidemia-again conservative here secondary to history of fatty liver  disease--continue to monitor at periodic intervals     London SheerLuster, Sally C, CMA [phone]

## 2016-01-07 NOTE — Progress Notes (Deleted)
Patient ID: [name] [name], male   DOB: October 06, 1945, 70 y.o.   MRN: [id number]  Location:      Place of Service:    Provider:  ***  Marga Melnick, MD  Patient Care Team: Pecola Lawless, MD as PCP - General (Internal Medicine)  Extended Emergency Contact Information Primary Emergency Contact: [name] Address: [address]          [place], [place] [address] D[place] of [place] Home Phone: [phone] Work Phone: [phone] Relation: Brother  Code Status:  *** Goals of care: Advanced Directive information Advanced Directives 12/18/2015  Does patient have an advance directive? Yes  Type of Advance Directive (No Data)  Does patient want to make changes to advanced directive? No - Patient declined  Copy of advanced directive(s) in chart? Yes     No chief complaint on file.   HPI:  Pt is a 70 y.o. male seen today for medical management of chronic diseases.     Past Medical History  Diagnosis Date  . Stroke (HCC)   . Fatty liver   . Pneumonia   . Dysphasia   . Pulmonary embolism (HCC)   . Hypertension    Past Surgical History  Procedure Laterality Date  . Unable      Allergies  Allergen Reactions  . Penicillins       Medication List    Notice    This visit is during an admission. Changes to the med list made in this visit will be reflected in the After Visit Summary of the admission.      Review of Systems  Immunization History  Administered Date(s) Administered  . Influenza-Unspecified 03/28/2014  . Pneumococcal-Unspecified 11/01/2009   Pertinent  Health Maintenance Due  Topic Date Due  . PNA vac Low Risk Adult (1 of 2 - PCV13) 10/30/2016 (Originally 11/08/2010)  . COLONOSCOPY  10/30/2025 (Originally 11/08/1995)  . INFLUENZA VACCINE  01/20/2016   No flowsheet data found. Functional Status Survey:    There were no vitals filed for this visit. There is no weight on file to calculate BMI. Physical Exam  Labs reviewed:  Recent  Labs  06/04/15 0715 09/12/15 09/12/15 0715 12/19/15 0735  NA 139 142 142 137  K 3.8  --  4.0 3.8  CL 104  --  105 103  CO2 29  --  29 30  GLUCOSE 78  --  76 75  BUN CREATININE 1.17 1.1 1.07 1.12  CALCIUM 8.4*  --  8.5* 8.3*    Recent Labs  06/04/15 0715 09/12/15 0715  AST 24 20  ALT 27 22  ALKPHOS 47 48  BILITOT 0.7 0.6  PROT 7.1 6.9  ALBUMIN 3.2* 3.2*    Recent Labs  06/04/15 0715 09/12/15 09/12/15 0715 12/19/15 0735  WBC 8.3 8.0 8.0 8.6  NEUTROABS 4.7 63 4.9 5.0  HGB 15.6  --  14.5 15.0  HCT 47.2  --  44.9 45.6  MCV 93.5  --  94.5 94.2  PLT 277  --  292 277   Lab Results  Component Value Date   TSH 1.285 12/19/2015   Lab Results  Component Value Date   HGBA1C  03/27/2009    6.0 (NOTE) The ADA recommends the following therapeutic goal for glycemic control related to Hgb A1c measurement: Goal of therapy: <6.5 Hgb A1c  Reference: American Diabetes Association: Clinical Practice Recommendations 2010, Diabetes Care, 2010, 33: (Suppl  1).   Lab Results  Component  Value Date   CHOL 132 10/07/2015   HDL 33* 10/07/2015   LDLCALC 87 10/07/2015   TRIG 62 10/07/2015   CHOLHDL 4.0 10/07/2015    Significant Diagnostic Results in last 30 days:  No results found.  Assessment/Plan There are no diagnoses linked to this encounter.   Family/ staff Communication: ***  Labs/tests ordered:  ***

## 2016-01-07 NOTE — Progress Notes (Signed)
This is a acute visit This encounter was created in error - please disregard.

## 2016-01-08 ENCOUNTER — Encounter (HOSPITAL_COMMUNITY)
Admission: RE | Admit: 2016-01-08 | Discharge: 2016-01-08 | Disposition: A | Discharge status: Home / Self Care | Payer: Medicare Other | Source: Skilled Nursing Facility | Attending: *Deleted | Admitting: *Deleted

## 2016-01-08 DIAGNOSIS — R1312 Dysphagia, oropharyngeal phase: Secondary | ICD-10-CM | POA: Diagnosis not present

## 2016-01-08 DIAGNOSIS — Z7901 Long term (current) use of anticoagulants: Secondary | ICD-10-CM | POA: Diagnosis not present

## 2016-01-08 DIAGNOSIS — I10 Essential (primary) hypertension: Secondary | ICD-10-CM | POA: Diagnosis not present

## 2016-01-08 DIAGNOSIS — I69998 Other sequelae following unspecified cerebrovascular disease: Secondary | ICD-10-CM | POA: Diagnosis not present

## 2016-01-08 DIAGNOSIS — R293 Abnormal posture: Secondary | ICD-10-CM | POA: Diagnosis not present

## 2016-01-08 DIAGNOSIS — G8191 Hemiplegia, unspecified affecting right dominant side: Secondary | ICD-10-CM | POA: Diagnosis not present

## 2016-01-08 LAB — PROTIME-INR
INR: 2.43 — ABNORMAL HIGH (ref 0.00–1.49)
Prothrombin Time: 26.1 seconds — ABNORMAL HIGH (ref 11.6–15.2)

## 2016-01-09 DIAGNOSIS — I69998 Other sequelae following unspecified cerebrovascular disease: Secondary | ICD-10-CM | POA: Diagnosis not present

## 2016-01-09 DIAGNOSIS — R1312 Dysphagia, oropharyngeal phase: Secondary | ICD-10-CM | POA: Diagnosis not present

## 2016-01-09 DIAGNOSIS — I10 Essential (primary) hypertension: Secondary | ICD-10-CM | POA: Diagnosis not present

## 2016-01-09 DIAGNOSIS — G8191 Hemiplegia, unspecified affecting right dominant side: Secondary | ICD-10-CM | POA: Diagnosis not present

## 2016-01-12 DIAGNOSIS — R1312 Dysphagia, oropharyngeal phase: Secondary | ICD-10-CM | POA: Diagnosis not present

## 2016-01-12 DIAGNOSIS — I69998 Other sequelae following unspecified cerebrovascular disease: Secondary | ICD-10-CM | POA: Diagnosis not present

## 2016-01-12 DIAGNOSIS — I10 Essential (primary) hypertension: Secondary | ICD-10-CM | POA: Diagnosis not present

## 2016-01-12 DIAGNOSIS — G8191 Hemiplegia, unspecified affecting right dominant side: Secondary | ICD-10-CM | POA: Diagnosis not present

## 2016-01-13 DIAGNOSIS — I10 Essential (primary) hypertension: Secondary | ICD-10-CM | POA: Diagnosis not present

## 2016-01-13 DIAGNOSIS — I69998 Other sequelae following unspecified cerebrovascular disease: Secondary | ICD-10-CM | POA: Diagnosis not present

## 2016-01-13 DIAGNOSIS — G8191 Hemiplegia, unspecified affecting right dominant side: Secondary | ICD-10-CM | POA: Diagnosis not present

## 2016-01-13 DIAGNOSIS — R1312 Dysphagia, oropharyngeal phase: Secondary | ICD-10-CM | POA: Diagnosis not present

## 2016-01-14 DIAGNOSIS — G8191 Hemiplegia, unspecified affecting right dominant side: Secondary | ICD-10-CM | POA: Diagnosis not present

## 2016-01-14 DIAGNOSIS — I69998 Other sequelae following unspecified cerebrovascular disease: Secondary | ICD-10-CM | POA: Diagnosis not present

## 2016-01-14 DIAGNOSIS — I10 Essential (primary) hypertension: Secondary | ICD-10-CM | POA: Diagnosis not present

## 2016-01-14 DIAGNOSIS — R1312 Dysphagia, oropharyngeal phase: Secondary | ICD-10-CM | POA: Diagnosis not present

## 2016-01-15 DIAGNOSIS — R1312 Dysphagia, oropharyngeal phase: Secondary | ICD-10-CM | POA: Diagnosis not present

## 2016-01-15 DIAGNOSIS — I10 Essential (primary) hypertension: Secondary | ICD-10-CM | POA: Diagnosis not present

## 2016-01-15 DIAGNOSIS — G8191 Hemiplegia, unspecified affecting right dominant side: Secondary | ICD-10-CM | POA: Diagnosis not present

## 2016-01-15 DIAGNOSIS — I69998 Other sequelae following unspecified cerebrovascular disease: Secondary | ICD-10-CM | POA: Diagnosis not present

## 2016-01-16 DIAGNOSIS — R1312 Dysphagia, oropharyngeal phase: Secondary | ICD-10-CM | POA: Diagnosis not present

## 2016-01-16 DIAGNOSIS — G8191 Hemiplegia, unspecified affecting right dominant side: Secondary | ICD-10-CM | POA: Diagnosis not present

## 2016-01-16 DIAGNOSIS — I10 Essential (primary) hypertension: Secondary | ICD-10-CM | POA: Diagnosis not present

## 2016-01-16 DIAGNOSIS — I69998 Other sequelae following unspecified cerebrovascular disease: Secondary | ICD-10-CM | POA: Diagnosis not present

## 2016-01-19 DIAGNOSIS — I10 Essential (primary) hypertension: Secondary | ICD-10-CM | POA: Diagnosis not present

## 2016-01-19 DIAGNOSIS — R1312 Dysphagia, oropharyngeal phase: Secondary | ICD-10-CM | POA: Diagnosis not present

## 2016-01-19 DIAGNOSIS — I69998 Other sequelae following unspecified cerebrovascular disease: Secondary | ICD-10-CM | POA: Diagnosis not present

## 2016-01-19 DIAGNOSIS — G8191 Hemiplegia, unspecified affecting right dominant side: Secondary | ICD-10-CM | POA: Diagnosis not present

## 2016-01-20 DIAGNOSIS — G8191 Hemiplegia, unspecified affecting right dominant side: Secondary | ICD-10-CM | POA: Diagnosis not present

## 2016-01-20 DIAGNOSIS — I69998 Other sequelae following unspecified cerebrovascular disease: Secondary | ICD-10-CM | POA: Diagnosis not present

## 2016-01-20 DIAGNOSIS — R1312 Dysphagia, oropharyngeal phase: Secondary | ICD-10-CM | POA: Diagnosis not present

## 2016-01-20 DIAGNOSIS — I10 Essential (primary) hypertension: Secondary | ICD-10-CM | POA: Diagnosis not present

## 2016-01-21 DIAGNOSIS — I69998 Other sequelae following unspecified cerebrovascular disease: Secondary | ICD-10-CM | POA: Diagnosis not present

## 2016-01-21 DIAGNOSIS — G8191 Hemiplegia, unspecified affecting right dominant side: Secondary | ICD-10-CM | POA: Diagnosis not present

## 2016-01-21 DIAGNOSIS — R1312 Dysphagia, oropharyngeal phase: Secondary | ICD-10-CM | POA: Diagnosis not present

## 2016-01-21 DIAGNOSIS — I10 Essential (primary) hypertension: Secondary | ICD-10-CM | POA: Diagnosis not present

## 2016-01-22 ENCOUNTER — Encounter (HOSPITAL_COMMUNITY)
Admission: RE | Admit: 2016-01-22 | Discharge: 2016-01-22 | Disposition: A | Discharge status: Home / Self Care | Payer: Medicare Other | Source: Skilled Nursing Facility | Attending: Internal Medicine | Admitting: Internal Medicine

## 2016-01-22 DIAGNOSIS — G8191 Hemiplegia, unspecified affecting right dominant side: Secondary | ICD-10-CM | POA: Insufficient documentation

## 2016-01-22 DIAGNOSIS — I10 Essential (primary) hypertension: Secondary | ICD-10-CM | POA: Diagnosis not present

## 2016-01-22 DIAGNOSIS — Z7901 Long term (current) use of anticoagulants: Secondary | ICD-10-CM | POA: Diagnosis not present

## 2016-01-22 DIAGNOSIS — R293 Abnormal posture: Secondary | ICD-10-CM | POA: Insufficient documentation

## 2016-01-22 DIAGNOSIS — R1312 Dysphagia, oropharyngeal phase: Secondary | ICD-10-CM | POA: Diagnosis not present

## 2016-01-22 DIAGNOSIS — I69998 Other sequelae following unspecified cerebrovascular disease: Secondary | ICD-10-CM | POA: Diagnosis not present

## 2016-01-22 LAB — PROTIME-INR
INR: 2.72
PROTHROMBIN TIME: 29.4 s — AB (ref 11.4–15.2)

## 2016-02-05 ENCOUNTER — Encounter (HOSPITAL_COMMUNITY)
Admission: RE | Admit: 2016-02-05 | Discharge: 2016-02-05 | Disposition: A | Discharge status: Home / Self Care | Payer: Medicare Other | Source: Skilled Nursing Facility | Attending: *Deleted | Admitting: *Deleted

## 2016-02-05 DIAGNOSIS — I10 Essential (primary) hypertension: Secondary | ICD-10-CM | POA: Diagnosis not present

## 2016-02-05 DIAGNOSIS — G8191 Hemiplegia, unspecified affecting right dominant side: Secondary | ICD-10-CM | POA: Diagnosis not present

## 2016-02-05 DIAGNOSIS — Z7901 Long term (current) use of anticoagulants: Secondary | ICD-10-CM | POA: Diagnosis not present

## 2016-02-05 DIAGNOSIS — R293 Abnormal posture: Secondary | ICD-10-CM | POA: Diagnosis not present

## 2016-02-05 DIAGNOSIS — I69998 Other sequelae following unspecified cerebrovascular disease: Secondary | ICD-10-CM | POA: Diagnosis not present

## 2016-02-05 LAB — PROTIME-INR
INR: 2.86
Prothrombin Time: 30.6 seconds — ABNORMAL HIGH (ref 11.4–15.2)

## 2016-02-12 ENCOUNTER — Encounter (HOSPITAL_COMMUNITY)
Admission: RE | Admit: 2016-02-12 | Discharge: 2016-02-12 | Disposition: A | Discharge status: Home / Self Care | Payer: Medicare Other | Source: Skilled Nursing Facility | Attending: *Deleted | Admitting: *Deleted

## 2016-02-12 DIAGNOSIS — I69998 Other sequelae following unspecified cerebrovascular disease: Secondary | ICD-10-CM | POA: Diagnosis not present

## 2016-02-12 DIAGNOSIS — Z7901 Long term (current) use of anticoagulants: Secondary | ICD-10-CM | POA: Diagnosis not present

## 2016-02-12 DIAGNOSIS — I10 Essential (primary) hypertension: Secondary | ICD-10-CM | POA: Diagnosis not present

## 2016-02-12 DIAGNOSIS — G8191 Hemiplegia, unspecified affecting right dominant side: Secondary | ICD-10-CM | POA: Diagnosis not present

## 2016-02-12 DIAGNOSIS — R293 Abnormal posture: Secondary | ICD-10-CM | POA: Diagnosis not present

## 2016-02-12 LAB — PROTIME-INR
INR: 2.86
Prothrombin Time: 30.6 seconds — ABNORMAL HIGH (ref 11.4–15.2)

## 2016-02-16 ENCOUNTER — Encounter: Payer: Self-pay | Admitting: Internal Medicine

## 2016-02-16 ENCOUNTER — Non-Acute Institutional Stay (SKILLED_NURSING_FACILITY): Payer: Medicare Other | Admitting: Internal Medicine

## 2016-02-16 DIAGNOSIS — I10 Essential (primary) hypertension: Secondary | ICD-10-CM

## 2016-02-16 DIAGNOSIS — K5900 Constipation, unspecified: Secondary | ICD-10-CM | POA: Diagnosis not present

## 2016-02-16 DIAGNOSIS — I2782 Chronic pulmonary embolism: Secondary | ICD-10-CM

## 2016-02-16 NOTE — Progress Notes (Signed)
Location:   [facility] Room Number: 112/W Place of Service:  SNF [id number](31) Provider:  Clearnce SorrelAnjali,Johnjoseph Rolfe  William Hopper, MD  Patient Care Team: Pecola LawlessWilliam F Hopper, MD as PCP - General (Internal Medicine)  Extended Emergency Contact Information Primary Emergency Contact: Venetia MaxonWalker,[name] Jr Address: [address]          [place], [place] [address] [place] Home Phone: [phone] Work Phone: [phone] Relation: Brother  Code Status:  Full Code Goals of care: Advanced Directive information Advanced Directives 02/16/2016  Does patient have an advance directive? Yes  Type of Advance Directive (No Data)  Does patient want to make changes to advanced directive? No - Patient declined  Copy of advanced directive(s) in chart? Yes     Chief Complaint  Patient presents with  . Medical Management of Chronic Issues    Routine Visit    HPI:  Pt is a 70 y.o. male seen today for medical management of chronic diseases.  Patient is doing good. Did not have any complaints. He said his appetite is good and he is sleeping well.   Past Medical History:  Diagnosis Date  . 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 Fatty liver   . Hypertension   . Pneumonia   . Pulmonary embolism (HCC)   . Stroke [facility](HCC)      Allergies  Allergen Reactions  . Penicillins     Current Outpatient Prescriptions on File Prior to Visit  Medication Sig Dispense Refill  . Eyelid Cleansers (SYSTANE LID WIPES EX) Start occusoft lid scrubs: clean eyelids of oil and debris BID OU ( each eye ) before administering eye drops.    Marland Kitchen. ipratropium-albuterol (DUONEB) 0.5-2.5 (3) MG/3ML SOLN 1 unit dose vial twice a day VIA NEBULIZER (DX; CHRONIC HYPOXIA )    . labetalol (NORMODYNE) 200 MG tablet Take 200 mg by mouth daily. For HTN    . lactulose (CHRONULAC) 10 GM/15ML solution Give 15cc by mouth twice daily For constipation    . Liniments (BEN GAY EX) Apply to affected area as needed for pain    . lisinopril  (PRINIVIL,ZESTRIL) 10 MG tablet Take 20 mg by mouth daily. For HTN    . Propylene Glycol-Glycerin (ARTIFICIAL TEARS) 1-0.3 % SOLN Apply to eye. 1 drop in both eyes twice daily    . sennosides-docusate sodium (SENOKOT-S) 8.6-50 MG tablet Take 1 tablet by mouth at bedtime. For constipation    . Vitamins A & D (VITAMIN A & D) ointment Apply to bilat feet and legs q day prn. Once a day    . warfarin (COUMADIN) 1 MG tablet ake 1/2 a tablet along with 3 mg tablet to equal 3.5 mg once a day.    . warfarin (COUMADIN) 3 MG tablet Take 3mg  tablet along with the 1/2 mg tablet to have 3.5 mg take once a day.    Cliffton Asters. White Petrolatum-Mineral Oil (SYSTANE NIGHTTIME) OINT Apply to eye. Apply to left eye at night     No current facility-administered medications on file prior to visit.     Review of Systems  Constitutional: Negative.   Eyes: Negative for pain, redness and itching.  Respiratory: Negative.   Cardiovascular: Negative.   Gastrointestinal: Negative.     Immunization History  Administered Date(s) Administered  . Influenza-Unspecified 03/28/2014  . Pneumococcal-Unspecified 11/01/2009   Pertinent  Health Maintenance Due  Topic Date Due  . INFLUENZA VACCINE  05/21/2016 (Originally 01/20/2016)  . PNA vac Low Risk Adult (1 of  2 - PCV13) 10/30/2016 (Originally 11/08/2010)  . COLONOSCOPY  10/30/2025 (Originally 11/08/1995)   No flowsheet data found. Functional Status Survey:    Vitals:   02/16/16 1559  BP: 125/90  Pulse: 73  Resp: 20  Temp: 98.3 F (36.8 C)  TempSrc: Oral  Weight: 208 lb 6.4 oz (94.5 kg)   Body mass index is 29.9 kg/m. Physical Exam  Constitutional: He is oriented to person, place, and time. He appears well-developed and well-nourished.  HENT:  Head: Normocephalic.  Cardiovascular: Normal rate, regular rhythm and normal heart sounds.   Pulmonary/Chest: Effort normal and breath sounds normal. No respiratory distress. He has no wheezes. He has no rales.  Abdominal:  Soft. Bowel sounds are normal. He exhibits no distension. There is no tenderness. There is no rebound.  Neurological: He is alert and oriented to person, place, and time.  Patient has 2/5 Rt UE and Rt Le. 4/5 Lt LE and Lt Ue. Severe Dysarthria present.    Labs reviewed:  Recent Labs  06/04/15 0715 09/12/15 09/12/15 0715 12/19/15 0735  NA 139 142 142 137  K 3.8  --  4.0 3.8  CL 104  --  105 103  CO2 29  --  29 30  GLUCOSE 78  --  76 75  BUN 15 18 18 20   CREATININE 1.17 1.1 1.07 1.12  CALCIUM 8.4*  --  8.5* 8.3*    Recent Labs  06/04/15 0715 09/12/15 0715  AST 24 20  ALT 27 22  ALKPHOS 47 48  BILITOT 0.7 0.6  PROT 7.1 6.9  ALBUMIN 3.2* 3.2*    Recent Labs  06/04/15 0715 09/12/15 09/12/15 0715 12/19/15 0735  WBC 8.3 8.0 8.0 8.6  NEUTROABS 4.7 63 4.9 5.0  HGB 15.6  --  14.5 15.0  HCT 47.2  --  44.9 45.6  MCV 93.5  --  94.5 94.2  PLT 277  --  292 277   Lab Results  Component Value Date   TSH 1.285 12/19/2015   Lab Results  Component Value Date   HGBA1C  03/27/2009    6.0 (NOTE) The ADA recommends the following therapeutic goal for glycemic control related to Hgb A1c measurement: Goal of therapy: <6.5 Hgb A1c  Reference: American Diabetes Association: Clinical Practice Recommendations 2010, Diabetes Care, 2010, 33: (Suppl  1).   Lab Results  Component Value Date   CHOL 132 10/07/2015   HDL 33 (L) 10/07/2015   LDLCALC 87 10/07/2015   TRIG 62 10/07/2015   CHOLHDL 4.0 10/07/2015   Assessment/Plan  Essential hypertension  Patient BP is running good. Stable with no change required.  Other chronic pulmonary embolism (HCC) Patient continues to be on Coumadin. Last PT/INR was 2.86  Constipation, unspecified constipation type Doing well Lactulose.  S/P Right hemiplegia  Doing well . Continue supportive therapy.

## 2016-02-26 ENCOUNTER — Encounter (HOSPITAL_COMMUNITY)
Admission: RE | Admit: 2016-02-26 | Discharge: 2016-02-26 | Disposition: A | Discharge status: Home / Self Care | Payer: Medicare Other | Source: Skilled Nursing Facility | Attending: Internal Medicine | Admitting: Internal Medicine

## 2016-02-26 ENCOUNTER — Non-Acute Institutional Stay (SKILLED_NURSING_FACILITY): Payer: Medicare Other | Admitting: Internal Medicine

## 2016-02-26 DIAGNOSIS — Z8673 Personal history of transient ischemic attack (TIA), and cerebral infarction without residual deficits: Secondary | ICD-10-CM | POA: Diagnosis not present

## 2016-02-26 DIAGNOSIS — Z7901 Long term (current) use of anticoagulants: Secondary | ICD-10-CM

## 2016-02-26 DIAGNOSIS — G8191 Hemiplegia, unspecified affecting right dominant side: Secondary | ICD-10-CM | POA: Insufficient documentation

## 2016-02-26 DIAGNOSIS — I10 Essential (primary) hypertension: Secondary | ICD-10-CM | POA: Diagnosis not present

## 2016-02-26 DIAGNOSIS — R293 Abnormal posture: Secondary | ICD-10-CM | POA: Diagnosis not present

## 2016-02-26 DIAGNOSIS — I69998 Other sequelae following unspecified cerebrovascular disease: Secondary | ICD-10-CM | POA: Insufficient documentation

## 2016-02-26 LAB — PROTIME-INR
INR: 3.32
PROTHROMBIN TIME: 34.5 s — AB (ref 11.4–15.2)

## 2016-02-26 NOTE — Progress Notes (Signed)
This is an acute visit.  Level care skilled.  [facility].  Chief complaint-acute visit for anticoagulation management with history of CVA DVT and pulmonary embolism.  History of present illness.  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  He is currently on 3 .5mg  of Coumadin a day and has been stable on this for some time however INR today is mildly elevated at 3.32.  On August 24 he was 2.86.  He was 2.7 to August 3.  There's been no increased bruising or bleeding he appears to be at his baseline and ambulating about facility in his wheelchair.  He has no complaints today   Past Medical History  Diagnosis Date  . Stroke (HCC)   . Fatty liver   . Pneumonia   . Dysphasia   . Pulmonary embolism (HCC)   . Hypertension         Past Surgical History  Procedure Laterality Date  . Unable          Allergies  Allergen Reactions  . Penicillins           Current Outpatient Prescriptions on File Prior to Visit  Medication Sig Dispense Refill  . Artificial Tear Ointment (REFRESH LACRI-LUBE OP) Apply thin layer to left eye at bedtime    . Eyelid Cleansers (OCUSOFT EYELID CLEANSING EX) Apply topically. OU two times daily before administering eye drops    . Hypromellose 0.4 % SOLN Artificial tears  Apply one drop in both eyes twice a day    . ipratropium-albuterol (DUONEB) 0.5-2.5 (3) MG/3ML SOLN 1 unit dose vial twice a day VIA NEBULIZER (DX; CHRONIC HYPOXIA )    . labetalol (NORMODYNE) 200 MG tablet Take 200 mg by mouth daily. For HTN    . lactulose (CHRONULAC) 10 GM/15ML solution Give 15cc by mouth twice daily For constipation    . Liniments (BEN GAY EX) Apply to affected area as needed for pain    . lisinopril (PRINIVIL,ZESTRIL) 10 MG tablet Take 20 mg by mouth daily. For HTN      . sennosides-docusate sodium (SENOKOT-S) 8.6-50 MG tablet Take 1 tablet by mouth at bedtime. For constipation    . Vitamins A & D (VITAMIN A & D) ointment Apply to bilat feet and legs q day prn. Once a day    . warfarin (COUMADIN)3.5 tablet 3.5 mg by mouth daily     No current facility-administered medications on file prior to visit.      Review of Systems   General no complaints of fever or chills.  Skin does not report any increased bruising or bleeding.  Eyes does not complain of visual changes Respiratory shortness breath or cough.  Cardiac no chest pain baseline lower extremity edema.  GI does not complain nausea vomiting diarrhea constipation or abdominal discomfort.  Neurologic continues with some right-sided weakness upper and lower extremities with some contracture of his right hand this is baseline does not complain of headache or dizziness.  Psych is not complaining of depression or anxiety continues to ambulate about the facility in his wheelchair      Immunization History  Administered Date(s) Administered  . Influenza-Unspecified 03/28/2014       Pertinent  Health Maintenance Due  Topic Date Due  . PNA vac Low Risk Adult (1 of 2 - PCV13) 10/30/2016 (Originally 11/08/2010)  . COLONOSCOPY  10/30/2025 (Originally 11/08/1995)  . INFLUENZA VACCINE  01/20/2016   No flowsheet data found. Functional Status Survey:    Temperature is 97.7 pulse 82 respirations 24 blood pressure 109/77  Physical Exam   In general this is a pleasant elderly male in no distress. Ambulatory in his wheelchair  His skin is warm and dry.  I do not see any evidence of increased bruising or bleeding.  Oropharynx clear mucous membranes moist.  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  Heart is regular rate and rhythm somewhat distant heart sounds without murmur gallop or rub has mild baseline lower extremity edema.     Neurologic is grossly intact continues with right-sided weakness which is not new. With some dysarthric speech which is baseline    Psych continues to be alert and oriented pleasant and appropriate  Labs reviewed:  02/26/2016.  INR 3.32.  02/12/2016.  INR 2.86.  02/05/2016.  INR 2.86.    11/14/2015.  INR 2.93.  11/07/2015-INR 2.22.  Previous INR 2 weeks ago was 1.91    Recent Labs (within last 365 days)   Recent Labs  02/24/15 0625 06/04/15 0715 09/12/15 09/12/15 0715  NA 137 139 142 142  K 4.0 3.8  --  4.0  CL 104 104  --  105  CO2 30 29  --  29  GLUCOSE 79 78  --  76  BUN 21* 15 18 18   CREATININE 1.09 1.17 1.1 1.07  CALCIUM 8.3* 8.4*  --  8.5*      Recent Labs (within last 365 days)   Recent Labs  12/04/14 0745 06/04/15 0715 09/12/15 0715  AST 40 24 20  ALT 56 27 22  ALKPHOS 42 47 48  BILITOT 0.7 0.7 0.6  PROT 6.7 7.1 6.9  ALBUMIN 3.2* 3.2* 3.2*      Recent Labs (within last 365 days)   Recent Labs  02/24/15 0625 06/04/15 0715 09/12/15 09/12/15 0715  WBC 8.2 8.3 8.0 8.0  NEUTROABS 4.9 4.7 63 4.9  HGB 14.6 15.6  --  14.5  HCT 44.6 47.2  --  44.9  MCV 93.9 93.5  --  94.5  PLT 268 277  --  292     Recent Labs  No results found for: TSH   Recent Labs        Lab Results  Component Value Date   HGBA1C  03/27/2009    6.0 (NOTE) The ADA recommends the following therapeutic goal for glycemic control related to Hgb A1c measurement: Goal of therapy: <6.5 Hgb A1c  Reference: American Diabetes Association: Clinical Practice Recommendations 2010, Diabetes Care, 2010, 33: (Suppl  1).     Recent Labs       Lab Results  Component Value Date   CHOL 132 10/07/2015   HDL 33* 10/07/2015   LDLCALC 87  10/07/2015   TRIG 62 10/07/2015   CHOLHDL 4.0 10/07/2015      Significant Diagnostic Results in last 30 days:  Imaging Results  No results found.    Assessment/Plan #1-history of CVA-with distant history DVT and pulmonary embolism-this appears to be stable he is on Coumadin for anticoagulation continues with baseline right-sided weakness and dysarthric speech-I INR slightly supratherapeutic today-will hold Coumadin tonight and restart on alternating doses of 3-and 3.5  mg a day-recheck PT/INR on Monday, September 11-this was discussed with Dr. Chales Abrahams.  [id number]

## 2016-03-01 ENCOUNTER — Other Ambulatory Visit (HOSPITAL_COMMUNITY)
Admission: RE | Admit: 2016-03-01 | Discharge: 2016-03-01 | Disposition: A | Discharge status: Home / Self Care | Payer: Medicare Other | Source: Skilled Nursing Facility | Attending: Internal Medicine | Admitting: Internal Medicine

## 2016-03-01 ENCOUNTER — Non-Acute Institutional Stay (SKILLED_NURSING_FACILITY): Payer: Medicare Other | Admitting: Internal Medicine

## 2016-03-01 DIAGNOSIS — I10 Essential (primary) hypertension: Secondary | ICD-10-CM | POA: Diagnosis not present

## 2016-03-01 DIAGNOSIS — Z7901 Long term (current) use of anticoagulants: Secondary | ICD-10-CM | POA: Diagnosis not present

## 2016-03-01 DIAGNOSIS — I638 Other cerebral infarction: Secondary | ICD-10-CM

## 2016-03-01 DIAGNOSIS — I6389 Other cerebral infarction: Secondary | ICD-10-CM

## 2016-03-01 LAB — PROTIME-INR
INR: 2.54
Prothrombin Time: 27.8 seconds — ABNORMAL HIGH (ref 11.4–15.2)

## 2016-03-02 NOTE — Progress Notes (Signed)
This is an acute visit.  Level care skilled.  Facility [facility].  Chief complaint-acute visit for anticoagulation management with history of CVA DVT and pulmonary embolism.  History of present illness.  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  Previously had been on 3.5 mg a day but INR rose up to 3.324 days ago-his Coumadin was held for one day and Coumadin restarted at alternating doses of 3 and 3.5 mg a day INR has normalized at 2.54 today.     There's been no increased bruising or bleeding he appears to be at his baseline and ambulating about facility in his wheelchair.  He has no complaints today       Past Medical History  Diagnosis Date  . Stroke (HCC)   . Fatty liver   . Pneumonia   . Dysphasia   . Pulmonary embolism (HCC)   . Hypertension         Past Surgical History  Procedure Laterality Date  . Unable          Allergies  Allergen Reactions  . Penicillins           Current Outpatient Prescriptions on File Prior to Visit  Medication Sig Dispense Refill  . Artificial Tear Ointment (REFRESH LACRI-LUBE OP) Apply thin layer to left eye at bedtime    . Eyelid Cleansers (OCUSOFT EYELID CLEANSING EX) Apply topically. OU two times daily before administering eye drops    . Hypromellose 0.4 % SOLN Artificial tears Apply one drop in both eyes twice a day    . ipratropium-albuterol (DUONEB) 0.5-2.5 (3) MG/3ML SOLN 1 unit dose vial twice a day VIA NEBULIZER (DX; CHRONIC HYPOXIA )    . labetalol (NORMODYNE) 200 MG tablet Take 200 mg by mouth daily. For HTN    . lactulose (CHRONULAC) 10 GM/15ML solution Give 15cc by mouth twice daily For constipation    . Liniments (BEN GAY EX) Apply to affected area as needed for pain    . lisinopril (PRINIVIL,ZESTRIL) 10 MG  tablet Take 20 mg by mouth daily. For HTN    . sennosides-docusate sodium (SENOKOT-S) 8.6-50 MG tablet Take 1 tablet by mouth at bedtime. For constipation    . Vitamins A & D (VITAMIN A & D) ointment Apply to bilat feet and legs q day prn. Once a day    . Coumadin-3 and 3.5 mg a day -- alternating doses       urina No current facility-administered medications on file prior to visit.      Review of Systems   General no complaints of fever or chills.  Skin does not report any increased bruising or bleeding.  Eyes does not complain of visual changes Respiratory shortness breath or cough.  Cardiac no chest pain baseline lower extremity edema.  GI does not complain nausea vomiting diarrhea constipation or abdominal discomfort.  Neurologic continues with some right-sided weakness upper and lower extremities with some contracture of his right hand this is baseline does not complain of headache or dizziness.  Psych is not complaining of depression or anxiety continues to ambulate about the facility in his wheelchair      Immunization History  Administered Date(s) Administered  . Influenza-Unspecified 03/28/2014       Pertinent Health Maintenance Due  Topic Date Due  . PNA vac Low Risk Adult (1 of 2 -  PCV13) 10/30/2016 (Originally 11/08/2010)  . COLONOSCOPY  10/30/2025 (Originally 11/08/1995)  . INFLUENZA VACCINE  01/20/2016   No flowsheet data found. Functional Status Survey:    He is afebrile pulse 62 respirations 20 blood pressure 128/76  Physical Exam   In general this is a pleasant elderly male in no distress. Ambulatory in his wheelchair  His skin is warm and dry.  I do not see any evidence of increased bruising or bleeding.  Oropharynx clear mucous membranes moist.  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  Heart is regular rate and rhythm somewhat distant heart sounds without murmur gallop or rub has mild baseline lower extremity edema.     Neurologic is grossly intact continues with right-sided weakness which is not new. With some dysarthric speech which is baseline--continues to ambulate well in his wheelchair    Psych continues to be alert and oriented pleasant and appropriate  Labs reviewed:  03/01/2016.  INR 2.54.  02/26/2016-INR 3.32.    02/26/2016.  INR 3.32.  02/12/2016.  INR 2.86.  02/05/2016.  INR 2.86.    11/14/2015.  INR 2.93.  11/07/2015-INR 2.22.  Previous INR 2 weeks ago was 1.91    Recent Labs (within last 365 days)   Recent Labs  02/24/15 0625 06/04/15 0715 09/12/15 09/12/15 0715  NA 137 139 142 142  K 4.0 3.8 --  4.0  CL 104 104 --  105  CO2 30 29 --  29  GLUCOSE 79 78 --  76  BUN 21* 15 18 18   CREATININE 1.09 1.17 1.1 1.07  CALCIUM 8.3* 8.4* --  8.5*      Recent Labs (within last 365 days)   Recent Labs  12/04/14 0745 06/04/15 0715 09/12/15 0715  AST 40 24 20  ALT 56 27 22  ALKPHOS 42 47 48  BILITOT 0.7 0.7 0.6  PROT 6.7 7.1 6.9  ALBUMIN 3.2* 3.2* 3.2*      Recent Labs (within last 365 days)   Recent Labs  02/24/15 0625 06/04/15 0715 09/12/15 09/12/15 0715  WBC 8.2 8.3 8.0 8.0  NEUTROABS 4.9 4.7 63 4.9  HGB 14.6 15.6 --  14.5  HCT 44.6 47.2 --  44.9  MCV 93.9 93.5 --  94.5  PLT 268 277 --  292     Recent Labs  No results found for: TSH   Recent Labs        Lab Results  Component Value Date   HGBA1C  03/27/2009    6.0 (NOTE) The ADA recommends the following therapeutic goal for glycemic control related to Hgb A1c measurement: Goal of therapy: <6.5 Hgb A1c Reference: American Diabetes Association: Clinical Practice  Recommendations 2010, Diabetes Care, 2010, 33: (Suppl 1).     Recent Labs       Lab Results  Component Value Date   CHOL 132 10/07/2015   HDL 33* 10/07/2015   LDLCALC 87 10/07/2015   TRIG 62 10/07/2015   CHOLHDL 4.0 10/07/2015      Significant Diagnostic Results in last 30 days:  Imaging Results  No results found.    Assessment/Plan #1-history of CVA-with distant history DVT and pulmonary embolism-t General he has been quite stable with occasional variations-INR has normalized on alternating doses of 3-1/2-and 3 mg a day  will continue this and recheck an INR in 1 week  2 hypertension I see listed variable systolics ranging from 140s to 109-I checked manually and got 128/76 today I do not see consistent elevations at this point continue current medications including labetalol 200 mg a day and lisinopril 20 mg a day  [id number]

## 2016-03-08 ENCOUNTER — Other Ambulatory Visit (HOSPITAL_COMMUNITY)
Admission: RE | Admit: 2016-03-08 | Discharge: 2016-03-08 | Disposition: A | Discharge status: Home / Self Care | Payer: Medicare Other | Source: Skilled Nursing Facility | Attending: Internal Medicine | Admitting: Internal Medicine

## 2016-03-08 ENCOUNTER — Non-Acute Institutional Stay (SKILLED_NURSING_FACILITY): Payer: Medicare Other | Admitting: Internal Medicine

## 2016-03-08 ENCOUNTER — Encounter: Payer: Self-pay | Admitting: Internal Medicine

## 2016-03-08 DIAGNOSIS — Z8673 Personal history of transient ischemic attack (TIA), and cerebral infarction without residual deficits: Secondary | ICD-10-CM

## 2016-03-08 DIAGNOSIS — I10 Essential (primary) hypertension: Secondary | ICD-10-CM | POA: Insufficient documentation

## 2016-03-08 DIAGNOSIS — Z7901 Long term (current) use of anticoagulants: Secondary | ICD-10-CM

## 2016-03-08 DIAGNOSIS — R791 Abnormal coagulation profile: Secondary | ICD-10-CM | POA: Diagnosis not present

## 2016-03-08 LAB — PROTIME-INR
INR: 3.27
PROTHROMBIN TIME: 34.1 s — AB (ref 11.4–15.2)

## 2016-03-08 NOTE — Progress Notes (Signed)
Location:   [facility] Room Number: 112/W Place of Service:  SNF [phone]) Provider:  Abigail Miyamoto, MD  Patient Care Team: Pecola Lawless, MD as PCP - General (Internal Medicine)  Extended Emergency Contact Information Primary Emergency Contact: [name] Address: [address]          [place], [place] [address] [place] of [place] Home Phone: [phone] Work Phone: [phone] Relation: Brother  Code Status:  Full Code Goals of care: Advanced Directive information Advanced Directives 03/08/2016  Does patient have an advance directive? Yes  Type of Advance Directive (No Data)  Does patient want to make changes to advanced directive? No - Patient declined  Copy of advanced directive(s) in chart? Yes    Chief complaint acute visit for anticoagulation management with history of CVA DVT and pulmonary embolism.--With elevated INR     HPI:  Pt is a 70 y.o. 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  At one point had been on 3.5 mg a day and quite stable but INR rose recently up to 3.32 on 02/26/2016 creatinine was held for a day and restarted on alternating doses of 3 and 3-1/2 mg a day and he did normalize at 2.54 last week.  However it is again elevated today at 3.27.  There is again no increased bruising or bleeding again is at his baseline ambulating about the facility in his wheelchair   Past Medical History:  Diagnosis Date  . Dysphasia   . Fatty liver   . Hypertension   . Pneumonia   . Pulmonary embolism (HCC)   . Stroke [facility])    Past Surgical History:  Procedure Laterality Date  . unable      Allergies  Allergen Reactions  . Penicillins     Current Outpatient Prescriptions on File Prior to Visit  Medication Sig Dispense Refill  . Eyelid Cleansers (SYSTANE LID WIPES EX) Start occusoft lid scrubs: clean  eyelids of oil and debris BID OU ( each eye ) before administering eye drops.    Marland Kitchen ipratropium-albuterol (DUONEB) 0.5-2.5 (3) MG/3ML SOLN 1 unit dose vial twice a day VIA NEBULIZER (DX; CHRONIC HYPOXIA )    . labetalol (NORMODYNE) 200 MG tablet Take 200 mg by mouth daily. For HTN    . lactulose (CHRONULAC) 10 GM/15ML solution Give 15cc by mouth twice daily For constipation    . Liniments (BEN GAY EX) Apply to affected area as needed for pain    . lisinopril (PRINIVIL,ZESTRIL) 10 MG tablet Take 20 mg by mouth daily. For HTN    . Propylene Glycol-Glycerin (ARTIFICIAL TEARS) 1-0.3 % SOLN Apply to eye. 1 drop in both eyes twice daily    . sennosides-docusate sodium (SENOKOT-S) 8.6-50 MG tablet Take 1 tablet by mouth at bedtime. For constipation    . Vitamins A & D (VITAMIN A & D) ointment Apply to bilat feet and legs q day prn. Once a day    . White Petrolatum-Mineral Oil (SYSTANE NIGHTTIME) OINT Apply to eye. Apply to left eye at night     No current facility-administered medications on file prior to visit.     Review of Systems    General no complaints of fever or chills.  Skin does not report any increased bruising or bleeding.  Eyes does not complain of visual changes Respiratory shortness breath or cough.  Cardiac no chest pain baseline lower  extremity edema.  GI does not complain nausea vomiting diarrhea constipation or abdominal discomfort.  Neurologic continues with some right-sided weakness upper and lower extremities with some contracture of his right hand this is baseline does not complain of headache or dizziness.  Psych is not complaining of depression or anxiety continues to ambulate about the facility in his wheelchair  Immunization History  Administered Date(s) Administered  . Influenza-Unspecified 03/28/2014  . Pneumococcal-Unspecified 11/01/2009   Pertinent  Health Maintenance Due  Topic Date Due  . INFLUENZA VACCINE  05/21/2016 (Originally 01/20/2016)  .  PNA vac Low Risk Adult (1 of 2 - PCV13) 10/30/2016 (Originally 11/08/2010)  . COLONOSCOPY  10/30/2025 (Originally 11/08/1995)   No flowsheet data found. Functional Status Survey:    Vitals:   03/08/16 1401  BP: 106/71  Pulse: 81  Resp: (!) 22  Temp: 97.6 F (36.4 C)  TempSrc: Oral   There is no height or weight on file to calculate BMI. Physical Exam   In general this is a pleasant elderly male in no distress. Ambulatory in his wheelchair  His skin is warm and dry.  I do not see any evidence of increased bruising or bleeding.  Oropharynx clear mucous membranes moist.  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  Heart is regular rate and rhythm somewhat distant heart sounds without murmur gallop or rub has mild baseline lower extremity edema.     Neurologic is grossly intact continues with right-sided weakness which is not new. With some dysarthric speech which is baseline--continues to ambulate well in his wheelchair    Psych continues to be alert and oriented pleasant and appropriate  Labs reviewed  INR is as noted above:  Recent Labs  06/04/15 0715 09/12/15 09/12/15 0715 12/19/15 0735  NA 139 142 142 137  K 3.8  --  4.0 3.8  CL 104  --  105 103  CO2 29  --  29 30  GLUCOSE 78  --  76 75  BUN 15 18 18 20   CREATININE 1.17 1.1 1.07 1.12  CALCIUM 8.4*  --  8.5* 8.3*    Recent Labs  06/04/15 0715 09/12/15 0715  AST 24 20  ALT 27 22  ALKPHOS 47 48  BILITOT 0.7 0.6  PROT 7.1 6.9  ALBUMIN 3.2* 3.2*    Recent Labs  06/04/15 0715 09/12/15 09/12/15 0715 12/19/15 0735  WBC 8.3 8.0 8.0 8.6  NEUTROABS 4.7 63 4.9 5.0  HGB 15.6  --  14.5 15.0  HCT 47.2  --  44.9 45.6  MCV 93.5  --  94.5 94.2  PLT 277   --  292 277   Lab Results  Component Value Date   TSH 1.285 12/19/2015   Lab Results  Component Value Date   HGBA1C  03/27/2009    6.0 (NOTE) The ADA recommends the following therapeutic goal for glycemic control related to Hgb A1c measurement: Goal of therapy: <6.5 Hgb A1c  Reference: American Diabetes Association: Clinical Practice Recommendations 2010, Diabetes Care, 2010, 33: (Suppl  1).   Lab Results  Component Value Date   CHOL 132 10/07/2015   HDL 33 (L) 10/07/2015   LDLCALC 87 10/07/2015   TRIG 62 10/07/2015   CHOLHDL 4.0 10/07/2015    Significant Diagnostic Results in last  30 days:  No results found.  Assessment/Plan  1 history CVA with distant history DVT and pulmonary embolism-on chronic Coumadin-patient actually has been stable  usuallywith his Coumadin dose however has had some recent fluctuations-INR is supratherapeutic today at 3.27 despite a slight reduction in his Coumadin dose-will hold his Coumadin tonight and then restart at 3 mg a day have this rechecked on Friday, September 22-.  [id number] .      London Sheer, [place] [phone]

## 2016-03-12 ENCOUNTER — Encounter (HOSPITAL_COMMUNITY)
Admission: RE | Admit: 2016-03-12 | Discharge: 2016-03-12 | Disposition: A | Discharge status: Home / Self Care | Payer: Medicare Other | Source: Skilled Nursing Facility | Attending: *Deleted | Admitting: *Deleted

## 2016-03-12 ENCOUNTER — Encounter: Payer: Self-pay | Admitting: Internal Medicine

## 2016-03-12 DIAGNOSIS — I10 Essential (primary) hypertension: Secondary | ICD-10-CM | POA: Diagnosis not present

## 2016-03-12 DIAGNOSIS — R293 Abnormal posture: Secondary | ICD-10-CM | POA: Diagnosis not present

## 2016-03-12 DIAGNOSIS — I69998 Other sequelae following unspecified cerebrovascular disease: Secondary | ICD-10-CM | POA: Diagnosis not present

## 2016-03-12 DIAGNOSIS — Z7901 Long term (current) use of anticoagulants: Secondary | ICD-10-CM | POA: Diagnosis not present

## 2016-03-12 DIAGNOSIS — G8191 Hemiplegia, unspecified affecting right dominant side: Secondary | ICD-10-CM | POA: Diagnosis not present

## 2016-03-12 LAB — PROTIME-INR
INR: 1.96
Prothrombin Time: 22.6 seconds — ABNORMAL HIGH (ref 11.4–15.2)

## 2016-03-15 ENCOUNTER — Encounter (HOSPITAL_COMMUNITY)
Admission: RE | Admit: 2016-03-15 | Discharge: 2016-03-15 | Disposition: A | Discharge status: Home / Self Care | Payer: Medicare Other | Source: Skilled Nursing Facility | Attending: *Deleted | Admitting: *Deleted

## 2016-03-15 DIAGNOSIS — I10 Essential (primary) hypertension: Secondary | ICD-10-CM | POA: Diagnosis not present

## 2016-03-15 DIAGNOSIS — G8191 Hemiplegia, unspecified affecting right dominant side: Secondary | ICD-10-CM | POA: Diagnosis not present

## 2016-03-15 DIAGNOSIS — Z7901 Long term (current) use of anticoagulants: Secondary | ICD-10-CM | POA: Diagnosis not present

## 2016-03-15 DIAGNOSIS — R293 Abnormal posture: Secondary | ICD-10-CM | POA: Diagnosis not present

## 2016-03-15 DIAGNOSIS — I69998 Other sequelae following unspecified cerebrovascular disease: Secondary | ICD-10-CM | POA: Diagnosis not present

## 2016-03-15 LAB — PROTIME-INR
INR: 1.88
Prothrombin Time: 21.9 seconds — ABNORMAL HIGH (ref 11.4–15.2)

## 2016-03-18 ENCOUNTER — Encounter (HOSPITAL_COMMUNITY)
Admission: RE | Admit: 2016-03-18 | Discharge: 2016-03-18 | Disposition: A | Discharge status: Home / Self Care | Payer: Medicare Other | Source: Skilled Nursing Facility | Attending: *Deleted | Admitting: *Deleted

## 2016-03-18 DIAGNOSIS — Z7901 Long term (current) use of anticoagulants: Secondary | ICD-10-CM | POA: Diagnosis not present

## 2016-03-18 DIAGNOSIS — R293 Abnormal posture: Secondary | ICD-10-CM | POA: Diagnosis not present

## 2016-03-18 DIAGNOSIS — G8191 Hemiplegia, unspecified affecting right dominant side: Secondary | ICD-10-CM | POA: Diagnosis not present

## 2016-03-18 DIAGNOSIS — I69998 Other sequelae following unspecified cerebrovascular disease: Secondary | ICD-10-CM | POA: Diagnosis not present

## 2016-03-18 DIAGNOSIS — I10 Essential (primary) hypertension: Secondary | ICD-10-CM | POA: Diagnosis not present

## 2016-03-18 LAB — PROTIME-INR
INR: 2.18
PROTHROMBIN TIME: 24.6 s — AB (ref 11.4–15.2)

## 2016-03-22 ENCOUNTER — Non-Acute Institutional Stay (SKILLED_NURSING_FACILITY): Payer: Medicare Other | Admitting: Internal Medicine

## 2016-03-22 ENCOUNTER — Encounter: Payer: Self-pay | Admitting: Internal Medicine

## 2016-03-22 DIAGNOSIS — K5900 Constipation, unspecified: Secondary | ICD-10-CM

## 2016-03-22 DIAGNOSIS — Z8673 Personal history of transient ischemic attack (TIA), and cerebral infarction without residual deficits: Secondary | ICD-10-CM

## 2016-03-22 DIAGNOSIS — I10 Essential (primary) hypertension: Secondary | ICD-10-CM

## 2016-03-22 DIAGNOSIS — Z8709 Personal history of other diseases of the respiratory system: Secondary | ICD-10-CM | POA: Diagnosis not present

## 2016-03-22 DIAGNOSIS — Z86718 Personal history of other venous thrombosis and embolism: Secondary | ICD-10-CM

## 2016-03-22 NOTE — Progress Notes (Signed)
Location:   [facility] Room Number: 112/W Place of Service:  SNF [phone]) Provider:  Abigail Miyamoto, MD  Patient Care Team: Pecola Lawless, MD as PCP - General (Internal Medicine)  Extended Emergency Contact Information Primary Emergency Contact: [name] Address: [address]          [place], [place] [address] [place] of [place] Home Phone: [phone] Work Phone: [phone] Relation: Brother  Code Status:  Full Code Goals of care: Advanced Directive information Advanced Directives 03/22/2016  Does patient have an advance directive? Yes  Type of Advance Directive (No Data)  Does patient want to make changes to advanced directive? No - Patient declined  Copy of advanced directive(s) in chart? Yes     Chief Complaint  Patient presents with  . Medical Management of Chronic Issues    Routine Visit   For medical management of chronic medical issues including history CVA-pulmonary embolism with DVT-hypertension-constipation- HPI:  Pt is a 70 y.o. male seen today for medical conditions as noted above.  He continues to be very stable-continues to ambulate about the facility in his wheelchair he has no complaints today does not complain of shortness breath chest pain appears to be at baseline.  Does have a history of hypertension and is on labetalol 200 mg daily as well as lisinopril 20 mg a day recent blood pressures 130/86-119/69-109/77-the highest one that I see is 143/87   He continues on Coumadin with a history of pulmonary embolism DVT and CVA with right-sided hemiparalysis-most recent INR 2.18 on 03/18/2016 updated INR is pending.          Past Medical History:  Diagnosis Date  . Dysphasia   . Fatty liver   . Hypertension   . Pneumonia   . Pulmonary embolism (HCC)   . Stroke [facility])    Past Surgical History:  Procedure Laterality Date  . unable      Allergies  Allergen Reactions  . Penicillins     Current  Outpatient Prescriptions on File Prior to Visit  Medication Sig Dispense Refill  . Eyelid Cleansers (SYSTANE LID WIPES EX) Start occusoft lid scrubs: clean eyelids of oil and debris BID OU ( each eye ) before administering eye drops.    Marland Kitchen ipratropium-albuterol (DUONEB) 0.5-2.5 (3) MG/3ML SOLN 1 unit dose vial twice a day VIA NEBULIZER (DX; CHRONIC HYPOXIA )    . labetalol (NORMODYNE) 200 MG tablet Take 200 mg by mouth daily. For HTN    . lactulose (CHRONULAC) 10 GM/15ML solution Give 15cc by mouth twice daily For constipation    . Liniments (BEN GAY EX) Apply to affected area as needed for pain    . lisinopril (PRINIVIL,ZESTRIL) 10 MG tablet Take 20 mg by mouth daily. For HTN    . Propylene Glycol-Glycerin (ARTIFICIAL TEARS) 1-0.3 % SOLN Apply to eye. 1 drop in both eyes twice daily    . sennosides-docusate sodium (SENOKOT-S) 8.6-50 MG tablet Take 1 tablet by mouth at bedtime. For constipation    . Vitamins A & D (VITAMIN A & D) ointment Apply to bilat feet and legs q day prn. Once a day    . warfarin (COUMADIN) 3 MG tablet Take 3 mg by mouth daily.    Cliffton Asters Petrolatum-Mineral Oil (SYSTANE NIGHTTIME) OINT Apply to eye. Apply to left eye at night     No current facility-administered medications on file prior to visit.     Review of Systems  General no complaints of fever or chills.  Skin does not complaining of any increased bruising bleeding or rashes.  Eyes does not complain of visual changes from baseline.  Resp--does not complain of shortness breath or cough.-  Cardiac no chest pain has mild lower extremity edema which is baseline.  GI does not complain nausea vomiting diarrhea constipation or abdominal discomfort.  Muscle skeletal does not complain of joint pain .  Neurologic continues with right-sided weakness which is baseline upper and lower extremities with some contractures of his right hand-does not complain of headache dizziness or syncopal-type  feelings.  Psych continues to be in good spirits pleasant does not complain of depression or anxiety   Immunization History  Administered Date(s) Administered  . Influenza-Unspecified 03/28/2014  . Pneumococcal-Unspecified 11/01/2009   Pertinent  Health Maintenance Due  Topic Date Due  . INFLUENZA VACCINE  05/21/2016 (Originally 01/20/2016)  . PNA vac Low Risk Adult (1 of 2 - PCV13) 10/30/2016 (Originally 11/08/2010)  . COLONOSCOPY  10/30/2025 (Originally 11/08/1995)   No flowsheet data found. Functional Status Survey:    Vitals:   03/22/16 1545  BP: 130/86  Pulse: 83  Resp: 20  Temp: 97.9 F (36.6 C)  TempSrc: Oral  SpO2: 98%  Weight: 208 lb 6.4 oz (94.5 kg)  Height: 5' 8.5" (1.74 m)   Body mass index is 31.23 kg/m. Physical Exam   In general this is a pleasant elderly male in no distress. Ambulating comfortably in his wheelchair  His skin is warm and dry.  I do not see any evidence of increased bruising or bleeding.  Oropharynx clear mucous membranes moist.  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  Heart is regular rate and rhythm  without murmur gallop or rub has mild baseline lower extremity edema.  Abdomen soft nontender protuberant positive bowel sounds  Muscle skeletal-continues with right-sided weakness contracture of his right hand to some extent but is able to move right upper and lower extremities-this is baseline--- strength appears preserved left upper and  lower extremities is ambulatory and wheelchair  Neurologic is grossly intact continues with right-sided weakness which is not new. With some dysarthric speech which is baseline    Psych continues to be alert and oriented pleasant and appropriate   Labs reviewed:  Recent Labs  06/04/15 0715 09/12/15 09/12/15 0715 12/19/15 0735  NA 139 142  142 137  K 3.8  --  4.0 3.8  CL 104  --  105 103  CO2 29  --  29 30  GLUCOSE 78  --  76 75  BUN 15 18 18 20   CREATININE 1.17 1.1 1.07 1.12  CALCIUM 8.4*  --  8.5* 8.3*    Recent Labs  06/04/15 0715 09/12/15 0715  AST 24 20  ALT 27 22  ALKPHOS 47 48  BILITOT 0.7 0.6  PROT 7.1 6.9  ALBUMIN 3.2* 3.2*    Recent Labs  06/04/15 0715 09/12/15 09/12/15 0715 12/19/15 0735  WBC 8.3 8.0 8.0 8.6  NEUTROABS 4.7 63 4.9 5.0  HGB 15.6  --  14.5 15.0  HCT 47.2  --  44.9 45.6  MCV 93.5  --  94.5 94.2  PLT 277  --  292 277   Lab Results  Component Value Date   TSH 1.285 12/19/2015   Lab Results  Component Value Date   HGBA1C  03/27/2009  6.0 (NOTE) The ADA recommends the following therapeutic goal for glycemic control related to Hgb A1c measurement: Goal of therapy: <6.5 Hgb A1c  Reference: American Diabetes Association: Clinical Practice Recommendations 2010, Diabetes Care, 2010, 33: (Suppl  1).   Lab Results  Component Value Date   CHOL 132 10/07/2015   HDL 33 (L) 10/07/2015   LDLCALC 87 10/07/2015   TRIG 62 10/07/2015   CHOLHDL 4.0 10/07/2015    Significant Diagnostic Results in last 30 days:  No results found.  Assessment/Plan Number -1-- CVA --this is a baseline--he continues on Coumadin for anticoagulation-INR has been stable  Most recently 2.74 on July 13 Update INR is pending   #2-hypertension- continues on labetalol and lisinopril I do not see consistent elevations  -will update a BMP  -   #3 history DVT-again continues on anticoagulation as noted above--update INR is pending-also will update a CBC to make sure hemoglobin is stable  #4-history of pneumonia---bronchitis--this is a stable he is on routine nebulizers as well as when necessary and is on a when necessary expectorant  #5-constipation-this appears stable current medications.  He is on lactulose.  #6-hyperlipidemia-again conservative here secondary to history of fatty liver  disease--continue to monitor at periodic intervals  [phone]

## 2016-03-23 ENCOUNTER — Encounter (HOSPITAL_COMMUNITY)
Admission: RE | Admit: 2016-03-23 | Discharge: 2016-03-23 | Disposition: A | Discharge status: Home / Self Care | Payer: Medicare Other | Source: Skilled Nursing Facility | Attending: Internal Medicine | Admitting: Internal Medicine

## 2016-03-23 DIAGNOSIS — I10 Essential (primary) hypertension: Secondary | ICD-10-CM | POA: Diagnosis not present

## 2016-03-23 DIAGNOSIS — G8191 Hemiplegia, unspecified affecting right dominant side: Secondary | ICD-10-CM | POA: Insufficient documentation

## 2016-03-23 DIAGNOSIS — I69998 Other sequelae following unspecified cerebrovascular disease: Secondary | ICD-10-CM | POA: Insufficient documentation

## 2016-03-23 DIAGNOSIS — Z7901 Long term (current) use of anticoagulants: Secondary | ICD-10-CM | POA: Insufficient documentation

## 2016-03-23 DIAGNOSIS — R293 Abnormal posture: Secondary | ICD-10-CM | POA: Diagnosis not present

## 2016-03-23 LAB — CBC
HEMATOCRIT: 46.5 % (ref 39.0–52.0)
Hemoglobin: 15.5 g/dL (ref 13.0–17.0)
MCH: 31.3 pg (ref 26.0–34.0)
MCHC: 33.3 g/dL (ref 30.0–36.0)
MCV: 93.8 fL (ref 78.0–100.0)
PLATELETS: 262 10*3/uL (ref 150–400)
RBC: 4.96 MIL/uL (ref 4.22–5.81)
RDW: 13.3 % (ref 11.5–15.5)
WBC: 7.4 10*3/uL (ref 4.0–10.5)

## 2016-03-23 LAB — BASIC METABOLIC PANEL
Anion gap: 4 — ABNORMAL LOW (ref 5–15)
BUN: 15 mg/dL (ref 6–20)
CHLORIDE: 106 mmol/L (ref 101–111)
CO2: 28 mmol/L (ref 22–32)
CREATININE: 1.08 mg/dL (ref 0.61–1.24)
Calcium: 8.3 mg/dL — ABNORMAL LOW (ref 8.9–10.3)
GFR calc Af Amer: >60 mL/min (ref >60)
GFR calc non Af Amer: >60 mL/min (ref >60)
Glucose, Bld: 77 mg/dL (ref 65–99)
Potassium: 4.3 mmol/L (ref 3.5–5.1)
SODIUM: 138 mmol/L (ref 135–145)

## 2016-03-25 ENCOUNTER — Other Ambulatory Visit (HOSPITAL_COMMUNITY)
Admission: AD | Admit: 2016-03-25 | Discharge: 2016-03-25 | Disposition: A | Discharge status: Home / Self Care | Payer: Medicare Other | Source: Skilled Nursing Facility | Attending: Internal Medicine | Admitting: Internal Medicine

## 2016-03-25 DIAGNOSIS — I10 Essential (primary) hypertension: Secondary | ICD-10-CM | POA: Diagnosis not present

## 2016-03-25 DIAGNOSIS — G8191 Hemiplegia, unspecified affecting right dominant side: Secondary | ICD-10-CM | POA: Diagnosis not present

## 2016-03-25 LAB — PROTIME-INR
INR: 2.44
PROTHROMBIN TIME: 27 s — AB (ref 11.4–15.2)

## 2016-04-15 ENCOUNTER — Encounter (HOSPITAL_COMMUNITY)
Admission: RE | Admit: 2016-04-15 | Discharge: 2016-04-15 | Disposition: A | Discharge status: Home / Self Care | Payer: Medicare Other | Source: Skilled Nursing Facility | Attending: *Deleted | Admitting: *Deleted

## 2016-04-15 DIAGNOSIS — I69998 Other sequelae following unspecified cerebrovascular disease: Secondary | ICD-10-CM | POA: Diagnosis not present

## 2016-04-15 DIAGNOSIS — R293 Abnormal posture: Secondary | ICD-10-CM | POA: Diagnosis not present

## 2016-04-15 DIAGNOSIS — Z7901 Long term (current) use of anticoagulants: Secondary | ICD-10-CM | POA: Diagnosis not present

## 2016-04-15 DIAGNOSIS — I10 Essential (primary) hypertension: Secondary | ICD-10-CM | POA: Diagnosis not present

## 2016-04-15 DIAGNOSIS — G8191 Hemiplegia, unspecified affecting right dominant side: Secondary | ICD-10-CM | POA: Diagnosis not present

## 2016-04-15 LAB — PROTIME-INR
INR: 3.64
Prothrombin Time: 37.1 seconds — ABNORMAL HIGH (ref 11.4–15.2)

## 2016-04-19 ENCOUNTER — Encounter: Payer: Self-pay | Admitting: Internal Medicine

## 2016-04-19 ENCOUNTER — Non-Acute Institutional Stay (SKILLED_NURSING_FACILITY): Payer: Medicare Other | Admitting: Internal Medicine

## 2016-04-19 ENCOUNTER — Encounter (HOSPITAL_COMMUNITY)
Admission: RE | Admit: 2016-04-19 | Discharge: 2016-04-19 | Disposition: A | Discharge status: Home / Self Care | Payer: Medicare Other | Source: Skilled Nursing Facility | Attending: *Deleted | Admitting: *Deleted

## 2016-04-19 DIAGNOSIS — I10 Essential (primary) hypertension: Secondary | ICD-10-CM | POA: Diagnosis not present

## 2016-04-19 DIAGNOSIS — I824Y9 Acute embolism and thrombosis of unspecified deep veins of unspecified proximal lower extremity: Secondary | ICD-10-CM | POA: Diagnosis not present

## 2016-04-19 DIAGNOSIS — I2782 Chronic pulmonary embolism: Secondary | ICD-10-CM | POA: Diagnosis not present

## 2016-04-19 DIAGNOSIS — J410 Simple chronic bronchitis: Secondary | ICD-10-CM

## 2016-04-19 DIAGNOSIS — I69998 Other sequelae following unspecified cerebrovascular disease: Secondary | ICD-10-CM | POA: Diagnosis not present

## 2016-04-19 DIAGNOSIS — I639 Cerebral infarction, unspecified: Secondary | ICD-10-CM

## 2016-04-19 DIAGNOSIS — G8191 Hemiplegia, unspecified affecting right dominant side: Secondary | ICD-10-CM | POA: Diagnosis not present

## 2016-04-19 DIAGNOSIS — R293 Abnormal posture: Secondary | ICD-10-CM | POA: Diagnosis not present

## 2016-04-19 DIAGNOSIS — Z7901 Long term (current) use of anticoagulants: Secondary | ICD-10-CM

## 2016-04-19 LAB — PROTIME-INR
INR: 1.55
PROTHROMBIN TIME: 18.7 s — AB (ref 11.4–15.2)

## 2016-04-19 NOTE — Progress Notes (Signed)
Location:   [facility] Room Number: 112/W Place of Service:  SNF [phone](31) Provider:  Pollyann SavoyAnjali,Gupta  Anjali L Gupta, MD  Patient Care Team: Mahlon GammonAnjali L Gupta, [name]  Extended Emergency Contact Information Primary Emergency Contact: Venetia MaxonWalker,[name] Jr Address: [address]          [place], [place] [address] [place] Home Phone: [phone] Work Phone: [phone] Relation: Brother  Code Status:  Full Code Goals of care: Advanced Directive information Advanced Directives 04/19/2016  Does patient have an advance directive? Yes  Type of Advance Directive (No Data)  Does patient want to make changes to advanced directive? No - Patient declined  Copy of advanced directive(s) in chart? Yes     Chief Complaint  Patient presents with  . Acute Visit    F/U Coumadin    HPI:  Pt is a 70 y.o. male seen today for an acute visit for Adjusting the Dose of Coumadin. Patient had been stable INR on 3.5 mg of Coumadin since 09/24. But Last Thurs his INR was 3.64. His coumadin was held and today his INR is 1.55 Patient is On chronic Coumadin for DVT , PE and H/O CVA. Patient also has h/o Hypertensiion. Patient was only c/o Dry cough and says he has Cold In his chest. No fever chills or SOB.    Past Medical History:  Diagnosis Date  . Dysphasia   . Fatty liver   . Hypertension   . Pneumonia   . Pulmonary embolism (HCC)   . Stroke [facility](HCC)    Past Surgical History:  Procedure Laterality Date  . unable      Allergies  Allergen Reactions  . Penicillins     Current Outpatient Prescriptions on File Prior to Visit  Medication Sig Dispense Refill  . Eyelid Cleansers (SYSTANE LID WIPES EX) Start occusoft lid scrubs: clean eyelids of oil and debris BID OU ( each eye ) before administering eye drops.    Marland Kitchen. ipratropium-albuterol (DUONEB) 0.5-2.5 (3) MG/3ML SOLN 1 unit dose vial twice a day VIA NEBULIZER (DX; CHRONIC HYPOXIA )    .  labetalol (NORMODYNE) 200 MG tablet Take 200 mg by mouth daily. For HTN    . lactulose (CHRONULAC) 10 GM/15ML solution Give 15cc by mouth twice daily For constipation    . Liniments (BEN GAY EX) Apply to affected area as needed for pain    . lisinopril (PRINIVIL,ZESTRIL) 10 MG tablet Take 20 mg by mouth daily. For HTN    . sennosides-docusate sodium (SENOKOT-S) 8.6-50 MG tablet Take 1 tablet by mouth at bedtime. For constipation    . Vitamins A & D (VITAMIN A & D) ointment Apply to bilat feet and legs q day prn. Once a day    . White Petrolatum-Mineral Oil (SYSTANE NIGHTTIME) OINT Apply to eye. Apply to left eye at night     No current facility-administered medications on file prior to visit.      Review of Systems  Constitutional: Negative for activity change, appetite change, chills, diaphoresis, fatigue, fever and unexpected weight change.  Respiratory: Positive for cough and chest tightness. Negative for apnea, shortness of breath, wheezing and stridor.   Cardiovascular: Positive for leg swelling. Negative for chest pain.    Immunization History  Administered Date(s) Administered  . Influenza-Unspecified 03/28/2014, 03/23/2016  . Pneumococcal-Unspecified 11/01/2009, 03/30/2016   Pertinent  Health Maintenance Due  Topic Date Due  . COLONOSCOPY  10/30/2025 (Originally 11/08/1995)  .  PNA vac Low Risk Adult (2 of 2 - PCV13) 03/30/2017  . INFLUENZA VACCINE  Completed   No flowsheet data found. Functional Status Survey:    Vitals:   04/19/16 1342  BP: 129/85  Pulse: 73  Resp: 18  Temp: 97.9 F (36.6 C)  TempSrc: Oral   There is no height or weight on file to calculate BMI. Physical Exam  Constitutional: He appears well-developed and well-nourished.  HENT:  Head: Normocephalic.  Mouth/Throat: Oropharynx is clear and moist.  Cardiovascular: Normal rate, regular rhythm and normal heart sounds.   Pulmonary/Chest: Effort normal.  Rales B/L  Abdominal: Soft. Bowel sounds are  normal. He exhibits no distension. There is no tenderness. There is no rebound.  Musculoskeletal:  Edema with chronic changes Left LE more then Right LE.    Labs reviewed:  Recent Labs  09/12/15 0715 12/19/15 0735 03/23/16 0730  NA 142 137 138  K 4.0 3.8 4.3  CL 105 103 106  CO2 29 30 28   GLUCOSE 76 75 77  BUN 18 20 15   CREATININE 1.07 1.12 1.08  CALCIUM 8.5* 8.3* 8.3*    Recent Labs  06/04/15 0715 09/12/15 0715  AST 24 20  ALT 27 22  ALKPHOS 47 48  BILITOT 0.7 0.6  PROT 7.1 6.9  ALBUMIN 3.2* 3.2*    Recent Labs  09/12/15 09/12/15 0715 12/19/15 0735 03/23/16 0730  WBC 8.0 8.0 8.6 7.4  NEUTROABS 63 4.9 5.0  --   HGB  --  14.5 15.0 15.5  HCT  --  44.9 45.6 46.5  MCV  --  94.5 94.2 93.8  PLT  --  292 277 262   Lab Results  Component Value Date   TSH 1.285 12/19/2015   Lab Results  Component Value Date   HGBA1C  03/27/2009    6.0 (NOTE) The ADA recommends the following therapeutic goal for glycemic control related to Hgb A1c measurement: Goal of therapy: <6.5 Hgb A1c  Reference: American Diabetes Association: Clinical Practice Recommendations 2010, Diabetes Care, 2010, 33: (Suppl  1).   Lab Results  Component Value Date   CHOL 132 10/07/2015   HDL 33 (L) 10/07/2015   LDLCALC 87 10/07/2015   TRIG 62 10/07/2015   CHOLHDL 4.0 10/07/2015    Significant Diagnostic Results in last 30 days:  No results found.  Assessment/Plan  Chronic Coumadin therapy due to H/O DVT and PE and CVA.  Looking at his dose will decrease the dose by almost 2mg  over the week. So will start him back on 3 Mg fro 5 days and 3.5 mg for 2 days. Review INR in 1 week.  Chronic Bronchitis Will increase his Nebs for 4 times a day for few days.  Hypertension Well Controlled.  Family/ staff Communication:   Labs/tests ordered:

## 2016-04-21 ENCOUNTER — Encounter: Payer: Self-pay | Admitting: Internal Medicine

## 2016-04-21 NOTE — Progress Notes (Signed)
This encounter was created in error - please disregard.

## 2016-04-22 ENCOUNTER — Encounter: Payer: Self-pay | Admitting: Internal Medicine

## 2016-04-22 NOTE — Progress Notes (Signed)
This encounter was created in error - please disregard.

## 2016-04-26 ENCOUNTER — Encounter (HOSPITAL_COMMUNITY)
Admission: RE | Admit: 2016-04-26 | Discharge: 2016-04-26 | Disposition: A | Discharge status: Home / Self Care | Payer: Medicare Other | Source: Skilled Nursing Facility | Attending: Internal Medicine | Admitting: Internal Medicine

## 2016-04-26 DIAGNOSIS — Z7901 Long term (current) use of anticoagulants: Secondary | ICD-10-CM | POA: Insufficient documentation

## 2016-04-26 DIAGNOSIS — I69998 Other sequelae following unspecified cerebrovascular disease: Secondary | ICD-10-CM | POA: Insufficient documentation

## 2016-04-26 DIAGNOSIS — G8191 Hemiplegia, unspecified affecting right dominant side: Secondary | ICD-10-CM | POA: Insufficient documentation

## 2016-04-26 DIAGNOSIS — I10 Essential (primary) hypertension: Secondary | ICD-10-CM | POA: Diagnosis not present

## 2016-04-26 DIAGNOSIS — R293 Abnormal posture: Secondary | ICD-10-CM | POA: Diagnosis not present

## 2016-04-26 LAB — PROTIME-INR
INR: 2.22
PROTHROMBIN TIME: 25 s — AB (ref 11.4–15.2)

## 2016-04-29 ENCOUNTER — Encounter: Payer: Self-pay | Admitting: Internal Medicine

## 2016-04-29 ENCOUNTER — Non-Acute Institutional Stay (SKILLED_NURSING_FACILITY): Payer: Medicare Other | Admitting: Internal Medicine

## 2016-04-29 DIAGNOSIS — J411 Mucopurulent chronic bronchitis: Secondary | ICD-10-CM | POA: Diagnosis not present

## 2016-04-29 DIAGNOSIS — I10 Essential (primary) hypertension: Secondary | ICD-10-CM

## 2016-04-29 DIAGNOSIS — I2782 Chronic pulmonary embolism: Secondary | ICD-10-CM

## 2016-04-29 DIAGNOSIS — I639 Cerebral infarction, unspecified: Secondary | ICD-10-CM | POA: Diagnosis not present

## 2016-04-29 NOTE — Progress Notes (Signed)
Location:   [facility] Room Number: 112/W Place of Service:  SNF [phone](31) Provider:  Pollyann SavoyAnjali,Vi Whitesel L Rei Medlen, MD  Patient Care Team: Mahlon GammonAnjali L Jalynne Persico, [name]  Extended Emergency Contact Information Primary Emergency Contact: Venetia MaxonWalker,[name] Jr Address: [address]          [place], [place] [address] [place] Home Phone: [phone] Work Phone: [phone] Relation: Brother  Code Status:  Full Code Goals of care: Advanced Directive information Advanced Directives 04/29/2016  Does patient have an advance directive? Yes  Type of Advance Directive (No Data)  Does patient want to make changes to advanced directive? No - Patient declined  Copy of advanced directive(s) in chart? Yes     Chief Complaint  Patient presents with  . Medical Management of Chronic Issues    Routine Visit    HPI:  Pt is a 70 y.o. male seen today for medical management of chronic diseases.  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 Hypertension and COPD. Patient Coumadin was adjusted as her INR was high but only complain patient has is of cough productive white sputum. He denies any Chest pain or fever or chills or SOB with cough. It doesn't look like Nebs help him that much. He is doing well other wise with weight has been stable at 210-204 lbs. Appetite is good. He gets around in his wheel chair. BP mostly well controlled.   Past Medical History:  Diagnosis Date  . Dysphasia   . Fatty liver   . Hypertension   . Pneumonia   . Pulmonary embolism (HCC)   . Stroke [facility](HCC)    Past Surgical History:  Procedure Laterality Date  . unable      Allergies  Allergen Reactions  . Penicillins     Current Outpatient Prescriptions on File Prior to Visit  Medication Sig Dispense Refill  . Eyelid Cleansers (SYSTANE LID WIPES EX) Start occusoft lid scrubs: clean eyelids of oil and  debris BID OU ( each eye ) before administering eye drops.    Marland Kitchen. ipratropium-albuterol (DUONEB) 0.5-2.5 (3) MG/3ML SOLN 1 unit dose vial twice a day VIA NEBULIZER (DX; CHRONIC HYPOXIA )    . labetalol (NORMODYNE) 200 MG tablet Take 200 mg by mouth daily. For HTN    . lactulose (CHRONULAC) 10 GM/15ML solution Give 15cc by mouth twice daily For constipation    . Liniments (BEN GAY EX) Apply to affected area as needed for pain    . lisinopril (PRINIVIL,ZESTRIL) 10 MG tablet Take 20 mg by mouth daily. For HTN    . polyvinyl alcohol (LIQUIFILM TEARS) 1.4 % ophthalmic solution Place 1 drop into both eyes 2 (two) times daily.    . sennosides-docusate sodium (SENOKOT-S) 8.6-50 MG tablet Take 1 tablet by mouth at bedtime. For constipation    . Vitamins A & D (VITAMIN A & D) ointment Apply to bilat feet and legs q day prn. Once a day    . White Petrolatum-Mineral Oil (SYSTANE NIGHTTIME) OINT Apply to eye. Apply to left eye at night     No current facility-administered medications on file prior to visit.     Review of Systems  Constitutional: Negative for activity change, appetite change, chills, diaphoresis, fatigue, fever and unexpected weight change.  HENT: Positive for congestion, drooling and postnasal drip. Negative for rhinorrhea, sinus pain and sinus pressure.   Respiratory:  Positive for cough. Negative for apnea, choking, chest tightness, shortness of breath and stridor.   Cardiovascular: Positive for leg swelling. Negative for chest pain and palpitations.  Gastrointestinal: Negative for abdominal distention, abdominal pain, anal bleeding, constipation, diarrhea and nausea.  Musculoskeletal: Negative.   Neurological: Negative.   Psychiatric/Behavioral: Negative.     Immunization History  Administered Date(s) Administered  . Influenza-Unspecified 03/28/2014, 03/23/2016  . Pneumococcal-Unspecified 11/01/2009, 03/30/2016   Pertinent  Health Maintenance Due  Topic Date Due  . COLONOSCOPY   10/30/2025 (Originally 11/08/1995)  . PNA vac Low Risk Adult (2 of 2 - PCV13) 03/30/2017  . INFLUENZA VACCINE  Completed   No flowsheet data found. Functional Status Survey:    Vitals:   04/29/16 1223  BP: (!) 152/88  Pulse: 74  Resp: (!) 22  Temp: 98.7 F (37.1 C)  TempSrc: Oral  SpO2: 93%   There is no height or weight on file to calculate BMI. Physical Exam  Constitutional: He is oriented to person, place, and time. He appears well-developed and well-nourished.  HENT:  Head: Normocephalic.  Mouth/Throat: Oropharynx is clear and moist.  Cardiovascular: Normal rate, regular rhythm and normal heart sounds.   No murmur heard. Pulmonary/Chest: Effort normal. No respiratory distress. He has wheezes. He has no rales. He exhibits no tenderness.  Abdominal: Soft. He exhibits no distension. There is no tenderness. There is no rebound and no guarding.  Musculoskeletal: He exhibits edema.  Neurological: He is alert and oriented to person, place, and time.  Psychiatric: He has a normal mood and affect. His behavior is normal. Judgment and thought content normal.    Labs reviewed:  Recent Labs  09/12/15 0715 12/19/15 0735 03/23/16 0730  NA 142 137 138  K 4.0 3.8 4.3  CL 105 103 106  CO2 29 30 28   GLUCOSE 76 75 77  BUN 18 20 15   CREATININE 1.07 1.12 1.08  CALCIUM 8.5* 8.3* 8.3*    Recent Labs  06/04/15 0715 09/12/15 0715  AST 24 20  ALT 27 22  ALKPHOS 47 48  BILITOT 0.7 0.6  PROT 7.1 6.9  ALBUMIN 3.2* 3.2*    Recent Labs  09/12/15 09/12/15 0715 12/19/15 0735 03/23/16 0730  WBC 8.0 8.0 8.6 7.4  NEUTROABS 63 4.9 5.0  --   HGB  --  14.5 15.0 15.5  HCT  --  44.9 45.6 46.5  MCV  --  94.5 94.2 93.8  PLT  --  292 277 262   Lab Results  Component Value Date   TSH 1.285 12/19/2015   Lab Results  Component Value Date   HGBA1C  03/27/2009    6.0 (NOTE) The ADA recommends the following therapeutic goal for glycemic control related to Hgb A1c measurement: Goal  of therapy: <6.5 Hgb A1c  Reference: American Diabetes Association: Clinical Practice Recommendations 2010, Diabetes Care, 2010, 33: (Suppl  1).   Lab Results  Component Value Date   CHOL 132 10/07/2015   HDL 33 (L) 10/07/2015   LDLCALC 87 10/07/2015   TRIG 62 10/07/2015   CHOLHDL 4.0 10/07/2015    Significant Diagnostic Results in last 30 days:  No results found.  Assessment/Plan   Chronic bronchitis (HCC) Will start him on Zithromax Also claritin and Mucinex as needed for cough.   Essential hypertension BP well controlled. No changes required.  S/P CVA Stable Continue Coumadin and supportive care   Chronic pulmonary embolism  On chronic Coumadin Repeat INR pending.     Family/ staff Communication:  Labs/tests ordered:

## 2016-05-01 ENCOUNTER — Encounter (HOSPITAL_COMMUNITY)
Admission: RE | Admit: 2016-05-01 | Discharge: 2016-05-01 | Disposition: A | Discharge status: Home / Self Care | Payer: Medicare Other | Source: Skilled Nursing Facility | Attending: *Deleted | Admitting: *Deleted

## 2016-05-01 DIAGNOSIS — I10 Essential (primary) hypertension: Secondary | ICD-10-CM | POA: Diagnosis not present

## 2016-05-01 DIAGNOSIS — R293 Abnormal posture: Secondary | ICD-10-CM | POA: Diagnosis not present

## 2016-05-01 DIAGNOSIS — I69998 Other sequelae following unspecified cerebrovascular disease: Secondary | ICD-10-CM | POA: Diagnosis not present

## 2016-05-01 DIAGNOSIS — Z7901 Long term (current) use of anticoagulants: Secondary | ICD-10-CM | POA: Diagnosis not present

## 2016-05-01 DIAGNOSIS — G8191 Hemiplegia, unspecified affecting right dominant side: Secondary | ICD-10-CM | POA: Diagnosis not present

## 2016-05-01 LAB — PROTIME-INR
INR: 2.73
Prothrombin Time: 29.5 seconds — ABNORMAL HIGH (ref 11.4–15.2)

## 2016-05-03 ENCOUNTER — Encounter (HOSPITAL_COMMUNITY)
Admission: RE | Admit: 2016-05-03 | Discharge: 2016-05-03 | Disposition: A | Discharge status: Home / Self Care | Payer: Medicare Other | Source: Skilled Nursing Facility | Attending: *Deleted | Admitting: *Deleted

## 2016-05-03 ENCOUNTER — Encounter: Payer: Self-pay | Admitting: Internal Medicine

## 2016-05-03 DIAGNOSIS — G8191 Hemiplegia, unspecified affecting right dominant side: Secondary | ICD-10-CM | POA: Diagnosis not present

## 2016-05-03 DIAGNOSIS — I10 Essential (primary) hypertension: Secondary | ICD-10-CM | POA: Diagnosis not present

## 2016-05-03 DIAGNOSIS — R293 Abnormal posture: Secondary | ICD-10-CM | POA: Diagnosis not present

## 2016-05-03 DIAGNOSIS — Z7901 Long term (current) use of anticoagulants: Secondary | ICD-10-CM | POA: Diagnosis not present

## 2016-05-03 DIAGNOSIS — I69998 Other sequelae following unspecified cerebrovascular disease: Secondary | ICD-10-CM | POA: Diagnosis not present

## 2016-05-03 LAB — PROTIME-INR
INR: 2.33
Prothrombin Time: 26 seconds — ABNORMAL HIGH (ref 11.4–15.2)

## 2016-05-10 ENCOUNTER — Encounter (HOSPITAL_COMMUNITY)
Admission: RE | Admit: 2016-05-10 | Discharge: 2016-05-10 | Disposition: A | Discharge status: Home / Self Care | Payer: Medicare Other | Source: Skilled Nursing Facility | Attending: Internal Medicine | Admitting: Internal Medicine

## 2016-05-10 DIAGNOSIS — Z7901 Long term (current) use of anticoagulants: Secondary | ICD-10-CM | POA: Diagnosis not present

## 2016-05-10 DIAGNOSIS — R293 Abnormal posture: Secondary | ICD-10-CM | POA: Diagnosis not present

## 2016-05-10 DIAGNOSIS — I69998 Other sequelae following unspecified cerebrovascular disease: Secondary | ICD-10-CM | POA: Diagnosis not present

## 2016-05-10 DIAGNOSIS — I10 Essential (primary) hypertension: Secondary | ICD-10-CM | POA: Diagnosis not present

## 2016-05-10 DIAGNOSIS — G8191 Hemiplegia, unspecified affecting right dominant side: Secondary | ICD-10-CM | POA: Diagnosis not present

## 2016-05-10 LAB — PROTIME-INR
INR: 3.24
Prothrombin Time: 33.8 seconds — ABNORMAL HIGH (ref 11.4–15.2)

## 2016-05-11 ENCOUNTER — Encounter (HOSPITAL_COMMUNITY)
Admission: RE | Admit: 2016-05-11 | Discharge: 2016-05-11 | Disposition: A | Discharge status: Home / Self Care | Payer: Medicare Other | Source: Skilled Nursing Facility | Attending: *Deleted | Admitting: *Deleted

## 2016-05-11 DIAGNOSIS — Z7901 Long term (current) use of anticoagulants: Secondary | ICD-10-CM | POA: Diagnosis not present

## 2016-05-11 DIAGNOSIS — R293 Abnormal posture: Secondary | ICD-10-CM | POA: Diagnosis not present

## 2016-05-11 DIAGNOSIS — I69998 Other sequelae following unspecified cerebrovascular disease: Secondary | ICD-10-CM | POA: Diagnosis not present

## 2016-05-11 DIAGNOSIS — G8191 Hemiplegia, unspecified affecting right dominant side: Secondary | ICD-10-CM | POA: Diagnosis not present

## 2016-05-11 DIAGNOSIS — I10 Essential (primary) hypertension: Secondary | ICD-10-CM | POA: Diagnosis not present

## 2016-05-11 LAB — PROTIME-INR
INR: 3.5
Prothrombin Time: 36 seconds — ABNORMAL HIGH (ref 11.4–15.2)

## 2016-05-13 ENCOUNTER — Other Ambulatory Visit (HOSPITAL_COMMUNITY)
Admission: RE | Admit: 2016-05-13 | Discharge: 2016-05-13 | Disposition: A | Discharge status: Home / Self Care | Payer: Medicare Other | Source: Skilled Nursing Facility | Attending: Internal Medicine | Admitting: Internal Medicine

## 2016-05-13 ENCOUNTER — Non-Acute Institutional Stay (SKILLED_NURSING_FACILITY): Payer: Medicare Other | Admitting: Internal Medicine

## 2016-05-13 DIAGNOSIS — Z7901 Long term (current) use of anticoagulants: Secondary | ICD-10-CM | POA: Insufficient documentation

## 2016-05-13 DIAGNOSIS — I2782 Chronic pulmonary embolism: Secondary | ICD-10-CM | POA: Diagnosis not present

## 2016-05-13 DIAGNOSIS — I10 Essential (primary) hypertension: Secondary | ICD-10-CM | POA: Diagnosis not present

## 2016-05-13 DIAGNOSIS — Z8673 Personal history of transient ischemic attack (TIA), and cerebral infarction without residual deficits: Secondary | ICD-10-CM | POA: Diagnosis not present

## 2016-05-13 LAB — PROTIME-INR
INR: 1.94
PROTHROMBIN TIME: 22.4 s — AB (ref 11.4–15.2)

## 2016-05-13 NOTE — Progress Notes (Signed)
This is an acute visit.  Level care skilled.  [facility]  Chief complaint--acute visit follow-up anticoagulation management.  History of present illness.  Patient is a pleasant 70 year old male who is on chronic anticoagulation with Coumadin with a history of chronic pulmonary embolism as well as CVA.  Usually he's been quite stable but recently was noted his INR went up to-3.5 on 05/11/2016 previous INRs had largely been within normal range 10 days ago was 2.33.  He had been on 3 mg a day for 5 days a week with 3.5 mg 2 days a week--.  His Coumadin was held 2 days ago secondary to the elevated INR in it has come down to 1.94 today.  There's been no increased bruising or bleeding patient appears to be at his baseline vital signs have been stable Past Medical History:  Diagnosis Date  . Dysphasia   . Fatty liver   . Hypertension   . Pneumonia   . Pulmonary embolism (HCC)   . Stroke [facility](HCC)         Past Surgical History:  Procedure Laterality Date  . unable          Allergies  Allergen Reactions  . Penicillins           Current Outpatient Prescriptions on File Prior to Visit  Medication Sig Dispense Refill  . Eyelid Cleansers (SYSTANE LID WIPES EX) Start occusoft lid scrubs: clean eyelids of oil and debris BID OU ( each eye ) before administering eye drops.    Marland Kitchen. ipratropium-albuterol (DUONEB) 0.5-2.5 (3) MG/3ML SOLN 1 unit dose vial twice a day VIA NEBULIZER (DX; CHRONIC HYPOXIA )    . labetalol (NORMODYNE) 200 MG tablet Take 200 mg by mouth daily. For HTN    . lactulose (CHRONULAC) 10 GM/15ML solution Give 15cc by mouth twice daily For constipation    . Liniments (BEN GAY EX) Apply to affected area as needed for pain    . lisinopril (PRINIVIL,ZESTRIL) 10 MG tablet Take 20 mg by mouth daily. For HTN    . polyvinyl alcohol (LIQUIFILM TEARS) 1.4 % ophthalmic solution Place 1 drop into both eyes 2 (two) times daily.    .  sennosides-docusate sodium (SENOKOT-S) 8.6-50 MG tablet Take 1 tablet by mouth at bedtime. For constipation    . Vitamins A & D (VITAMIN A & D) ointment Apply to bilat feet and legs q day prn. Once a day    . White Petrolatum-Mineral Oil (SYSTANE NIGHTTIME) OINT Apply to eye. Apply to left eye at night     No current facility-administered medications on file prior to visit.     Review of Systems  Constitutional: Negative for activity change, appetite change, chills, diaphoresis, fatigue, fever and unexpected weight change.  HENT: Positive for  drooling  . Negative for rhinorrhea, sinus pain and sinus pressure.   Respiratory: Negative for cough Negative for apnea, choking, chest tightness, shortness of breath and stridor.   Cardiovascular: Positive for leg swelling. Negative for chest pain and palpitations.  Gastrointestinal: Negative for abdominal distention, abdominal pain, anal bleeding, constipation, diarrhea and nausea.  Musculoskeletal: Negative.   Neurological: Negative.   Psychiatric/Behavioral: Negative.         Immunization History  Administered Date(s) Administered  . Influenza-Unspecified 03/28/2014, 03/23/2016  . Pneumococcal-Unspecified 11/01/2009, 03/30/2016       Pertinent  Health Maintenance Due  Topic Date Due  . COLONOSCOPY  10/30/2025 (Originally 11/08/1995)  . PNA vac Low Risk Adult (2 of  2 - PCV13) 03/30/2017  . INFLUENZA VACCINE  Completed   No flowsheet data found. Functional Status Survey:      Temperature 97.0 pulse 74 respirations 20 blood pressure 115/74 Physical Exam  Constitutional: . He appears well-developed and well-nourished.  Skin is warm and dry I do not note any increased bruising or bleeding HENT:  Head: Normocephalic.  Mouth/Throat: Oropharynx is clear and moist.  Cardiovascular: Normal rate, regular rhythm and normal heart sounds.   No murmur heard. Pulmonary/Chest: Effort normal. No respiratory distress. He has rhonci  which are baseline. He has no rales. He exhibits no tenderness.  Abdominal: Soft. He exhibits no distension. There is no tenderness. There is no rebound and no guarding.  Musculoskeletal: He exhibits edema.  Neurological: He is alert and oriented to person, place, and time.  Has chronic right-sided weakness with contracture of his right hand but is capable some movement on the right side  Psychiatric: He has a normal mood and affect. His behavior is normal. Judgment and thought content normal.    Labs reviewed:  05/13/2016.  INR 1.94.  05/11/2016.  INR 3.5.    Recent Labs (within last 365 days)   Recent Labs  09/12/15 0715 12/19/15 0735 03/23/16 0730  NA 142 137 138  K 4.0 3.8 4.3  CL 105 103 106  CO2 29 30 28   GLUCOSE 76 75 77  BUN 18 20 15   CREATININE 1.07 1.12 1.08  CALCIUM 8.5* 8.3* 8.3*      Recent Labs (within last 365 days)   Recent Labs  06/04/15 0715 09/12/15 0715  AST 24 20  ALT 27 22  ALKPHOS 47 48  BILITOT 0.7 0.6  PROT 7.1 6.9  ALBUMIN 3.2* 3.2*      Recent Labs (within last 365 days)   Recent Labs  09/12/15 09/12/15 0715 12/19/15 0735 03/23/16 0730  WBC 8.0 8.0 8.6 7.4  NEUTROABS 63 4.9 5.0  --   HGB  --  14.5 15.0 15.5  HCT  --  44.9 45.6 46.5  MCV  --  94.5 94.2 93.8  PLT  --  292 277 262     Recent Labs       Lab Results  Component Value Date   TSH 1.285 12/19/2015     Recent Labs        Lab Results  Component Value Date   HGBA1C  03/27/2009    6.0 (NOTE) The ADA recommends the following therapeutic goal for glycemic control related to Hgb A1c measurement: Goal of therapy: <6.5 Hgb A1c  Reference: American Diabetes Association: Clinical Practice Recommendations 2010, Diabetes Care, 2010, 33: (Suppl  1).     Recent Labs       Lab Results  Component Value Date   CHOL 132 10/07/2015   HDL 33 (L) 10/07/2015   LDLCALC 87 10/07/2015   TRIG 62 10/07/2015   CHOLHDL 4.0 10/07/2015      Assessment and plan.  Anticoagulation management with history of CVA and chronic pulmonary embolus INR was supratherapeutic 2 days ago 3.5  Coumadin was held and INR is now down to 1.94-will restart Coumadin at 3 mg a day-and recheck this in 2 days  Hypertension this appears stable on labetalol 200 mg a day-lisinopril 20 mg a day-recent blood pressures 115/74-133/82-at this point will monitor  [id number]

## 2016-05-15 ENCOUNTER — Encounter (HOSPITAL_COMMUNITY)
Admission: RE | Admit: 2016-05-15 | Discharge: 2016-05-15 | Disposition: A | Discharge status: Home / Self Care | Payer: Medicare Other | Source: Skilled Nursing Facility | Attending: Pediatrics | Admitting: Pediatrics

## 2016-05-15 DIAGNOSIS — I69998 Other sequelae following unspecified cerebrovascular disease: Secondary | ICD-10-CM | POA: Diagnosis not present

## 2016-05-15 DIAGNOSIS — I10 Essential (primary) hypertension: Secondary | ICD-10-CM | POA: Diagnosis not present

## 2016-05-15 DIAGNOSIS — G8191 Hemiplegia, unspecified affecting right dominant side: Secondary | ICD-10-CM | POA: Diagnosis not present

## 2016-05-15 DIAGNOSIS — Z7901 Long term (current) use of anticoagulants: Secondary | ICD-10-CM | POA: Diagnosis not present

## 2016-05-15 DIAGNOSIS — R293 Abnormal posture: Secondary | ICD-10-CM | POA: Diagnosis not present

## 2016-05-15 LAB — PROTIME-INR
INR: 1.76
Prothrombin Time: 20.8 s — ABNORMAL HIGH (ref 11.4–15.2)

## 2016-05-17 ENCOUNTER — Encounter (HOSPITAL_COMMUNITY)
Admission: RE | Admit: 2016-05-17 | Discharge: 2016-05-17 | Disposition: A | Discharge status: Home / Self Care | Payer: Medicare Other | Source: Skilled Nursing Facility | Attending: *Deleted | Admitting: *Deleted

## 2016-05-17 DIAGNOSIS — Z7901 Long term (current) use of anticoagulants: Secondary | ICD-10-CM | POA: Diagnosis not present

## 2016-05-17 DIAGNOSIS — I10 Essential (primary) hypertension: Secondary | ICD-10-CM | POA: Diagnosis not present

## 2016-05-17 DIAGNOSIS — R293 Abnormal posture: Secondary | ICD-10-CM | POA: Diagnosis not present

## 2016-05-17 DIAGNOSIS — I69998 Other sequelae following unspecified cerebrovascular disease: Secondary | ICD-10-CM | POA: Diagnosis not present

## 2016-05-17 DIAGNOSIS — G8191 Hemiplegia, unspecified affecting right dominant side: Secondary | ICD-10-CM | POA: Diagnosis not present

## 2016-05-17 LAB — PROTIME-INR
INR: 1.99
Prothrombin Time: 22.9 seconds — ABNORMAL HIGH (ref 11.4–15.2)

## 2016-05-24 ENCOUNTER — Encounter (HOSPITAL_COMMUNITY)
Admission: AD | Admit: 2016-05-24 | Discharge: 2016-05-24 | Disposition: A | Discharge status: Home / Self Care | Payer: Medicare Other | Source: Skilled Nursing Facility | Attending: Internal Medicine | Admitting: Internal Medicine

## 2016-05-24 DIAGNOSIS — I10 Essential (primary) hypertension: Secondary | ICD-10-CM | POA: Insufficient documentation

## 2016-05-24 DIAGNOSIS — R293 Abnormal posture: Secondary | ICD-10-CM | POA: Diagnosis not present

## 2016-05-24 DIAGNOSIS — G8191 Hemiplegia, unspecified affecting right dominant side: Secondary | ICD-10-CM | POA: Insufficient documentation

## 2016-05-24 DIAGNOSIS — Z7901 Long term (current) use of anticoagulants: Secondary | ICD-10-CM | POA: Diagnosis not present

## 2016-05-24 DIAGNOSIS — I69998 Other sequelae following unspecified cerebrovascular disease: Secondary | ICD-10-CM | POA: Insufficient documentation

## 2016-05-24 LAB — PROTIME-INR
INR: 2.27
PROTHROMBIN TIME: 25.4 s — AB (ref 11.4–15.2)

## 2016-05-28 ENCOUNTER — Non-Acute Institutional Stay (SKILLED_NURSING_FACILITY): Payer: Medicare Other | Admitting: Internal Medicine

## 2016-05-28 ENCOUNTER — Encounter: Payer: Self-pay | Admitting: Internal Medicine

## 2016-05-28 DIAGNOSIS — K5900 Constipation, unspecified: Secondary | ICD-10-CM

## 2016-05-28 DIAGNOSIS — Z8673 Personal history of transient ischemic attack (TIA), and cerebral infarction without residual deficits: Secondary | ICD-10-CM

## 2016-05-28 DIAGNOSIS — I10 Essential (primary) hypertension: Secondary | ICD-10-CM

## 2016-05-28 DIAGNOSIS — Z8709 Personal history of other diseases of the respiratory system: Secondary | ICD-10-CM

## 2016-05-28 DIAGNOSIS — I824Y9 Acute embolism and thrombosis of unspecified deep veins of unspecified proximal lower extremity: Secondary | ICD-10-CM

## 2016-05-28 NOTE — Progress Notes (Signed)
Location:    [facility] Room Number: 112/W Place of Service:  SNF [phone](31) Provider:  Pollyann SavoyAnjali,Gupta  Anjali L Gupta, MD  Patient Care Team: Mahlon GammonAnjali L Gupta, [name]  Extended Emergency Contact Information Primary Emergency Contact: Venetia MaxonWalker,[name] Jr Address: [address]          [place], [place] [address] [place] Home Phone: [phone] Work Phone: [phone] Relation: Brother  Code Status:  Full Code Goals of care: Advanced Directive information Advanced Directives 05/28/2016  Does Patient Have a Medical Advance Directive? Yes  Type of Advance Directive (No Data)  Does patient want to make changes to medical advance directive? No - Patient declined  Copy of Healthcare Power of Attorney in Chart? -     Chief Complaint  Patient presents with  . Medical Management of Chronic Issues    Routine Visit  for medical m chronic anticoagulation with Coumadin with history of DVT and pulmonary embolism as well as CVA with right-sided hemiparalysis.  Hypertension-COPD-  HPI:  Pt is a 70 y.o. male seen today for medical management of chronic diseases. as noted above.  He continues to be quite stable.  He ambulates about facility in his wheelchair continues to be pleasant-she denies any issues today nursing staff has not noted any recent either.  Last month he did receive a short course of Zithromax for suspected URI with increased cough and congestion as well as nasal drainage this appears to have resolved.  He is also on Coumadin with a history again of DVT and pulmonary embolism-usually he's been quite stable recently he has had some fluctuations in Coumadin actually had to be held for short period-it has been restarted at 3.5 mg a day and this appears to be stable with an INR of 27 on lab done on December 4-and updated INR is pending.       Past Medical History:  Diagnosis Date  . Dysphasia   . Fatty liver   .  Hypertension   . Pneumonia   . Pulmonary embolism (HCC)   . Stroke [facility](HCC)    Past Surgical History:  Procedure Laterality Date  . unable      Allergies  Allergen Reactions  . Penicillins     Current Outpatient Prescriptions on File Prior to Visit  Medication Sig Dispense Refill  . Eyelid Cleansers (SYSTANE LID WIPES EX) Start occusoft lid scrubs: clean eyelids of oil and debris BID OU ( each eye ) before administering eye drops.    Marland Kitchen. ipratropium-albuterol (DUONEB) 0.5-2.5 (3) MG/3ML SOLN 1 unit dose vial twice a day VIA NEBULIZER (DX; CHRONIC HYPOXIA )    . labetalol (NORMODYNE) 200 MG tablet Take 200 mg by mouth daily. For HTN    . lactulose (CHRONULAC) 10 GM/15ML solution Give 15cc by mouth twice daily For constipation    . Liniments (BEN GAY EX) Apply to affected area as needed for pain    . lisinopril (PRINIVIL,ZESTRIL) 10 MG tablet Take 20 mg by mouth daily. For HTN    . polyvinyl alcohol (LIQUIFILM TEARS) 1.4 % ophthalmic solution Place 1 drop into both eyes 2 (two) times daily.    . sennosides-docusate sodium (SENOKOT-S) 8.6-50 MG tablet Take 1 tablet by mouth at bedtime. For constipation    . warfarin (COUMADIN) 1 MG tablet Give 1/2 tablet by mouth along with 3 mg once a day on Mon    . warfarin (COUMADIN) 3 MG tablet Give 3 mg  by mouth along with 0.5 mg on Mon    . warfarin (COUMADIN) 3 MG tablet Give 3 mg by mouth once a day on Sun,Tue,Wed,Thu,Fri,Sat    . White Petrolatum-Mineral Oil (SYSTANE NIGHTTIME) OINT Apply to eye. Apply to left eye at night     No current facility-administered medications on file prior to visit.      Review of Systems   General no complaints of fever or chills.  Skin does not complaining of any increased bruising bleeding or rashes.  Eyes does not complain of visual changes from baseline.  Resp--does not complain of shortness breath or cough.-Recently treated for a URI  Cardiac no chest pain has mild lower extremity edema which is  baseline.  GI does not complain nausea vomiting diarrhea constipation or abdominal discomfort.  Muscle skeletal does not complain of joint pain .  Neurologic continues with right-sided weakness which is baseline upper and lower extremities with some contractures of his right hand-does not complain of headache dizziness or syncopal-type feelings.  Psych continues to be in good spirits pleasant does not complain of depression or anxiety   Immunization History  Administered Date(s) Administered  . Influenza-Unspecified 03/28/2014, 03/23/2016  . Pneumococcal-Unspecified 11/01/2009, 03/30/2016   Pertinent  Health Maintenance Due  Topic Date Due  . COLONOSCOPY  10/30/2025 (Originally 11/08/1995)  . PNA vac Low Risk Adult (2 of 2 - PCV13) 03/30/2017  . INFLUENZA VACCINE  Completed   No flowsheet data found. Functional Status Survey:     Temperature 97.2 pulse 84 respirations 20 blood pressure 129/84-132/86-weight is stable at 206.6 pounds  Physical Exam In general this is a pleasant elderly male in no distress. Ambulating comfortably in his wheelchair  His skin is warm and dry.  I do not see any evidence of increased bruising or bleeding.  Oropharynx clear mucous membranes moist.  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  Heart is regular rate and rhythm  without murmur gallop or rub has mild baseline lower extremity edema  Heart sounds are somewhat distant a.  Abdomen soft nontender protuberant positive bowel sounds  Muscle skeletal-continues with right-sided weakness contracture of his right hand to some extent but is able to move right upper and lower extremities-this is baseline--- strength appears preserved left upper and  lower extremities is ambulatory in wheelchair  Neurologic is grossly intact continues  with right-sided weakness which is not new. With some dysarthric speech which is baseline    Psych continues to be alert and oriented pleasant and appropriate   Labs reviewed:  Recent Labs  09/12/15 0715 12/19/15 0735 03/23/16 0730  NA 142 137 138  K 4.0 3.8 4.3  CL 105 103 106  CO2 29 30 28   GLUCOSE 76 75 77  BUN 18 20 15   CREATININE 1.07 1.12 1.08  CALCIUM 8.5* 8.3* 8.3*    Recent Labs  06/04/15 0715 09/12/15 0715  AST 24 20  ALT 27 22  ALKPHOS 47 48  BILITOT 0.7 0.6  PROT 7.1 6.9  ALBUMIN 3.2* 3.2*    Recent Labs  09/12/15 09/12/15 0715 12/19/15 0735 03/23/16 0730  WBC 8.0 8.0 8.6 7.4  NEUTROABS 63 4.9 5.0  --   HGB  --  14.5 15.0 15.5  HCT  --  44.9 45.6 46.5  MCV  --  94.5 94.2 93.8  PLT  --  292 277  262   Lab Results  Component Value Date   TSH 1.285 12/19/2015   Lab Results  Component Value Date   HGBA1C  03/27/2009    6.0 (NOTE) The ADA recommends the following therapeutic goal for glycemic control related to Hgb A1c measurement: Goal of therapy: <6.5 Hgb A1c  Reference: American Diabetes Association: Clinical Practice Recommendations 2010, Diabetes Care, 2010, 33: (Suppl  1).   Lab Results  Component Value Date   CHOL 132 10/07/2015   HDL 33 (L) 10/07/2015   LDLCALC 87 10/07/2015   TRIG 62 10/07/2015   CHOLHDL 4.0 10/07/2015    Significant Diagnostic Results in last 30 days:  No results found.  Assessment/Plan :   Number -1-- CVA --this is a baseline--he continues on Coumadin for anticoagulation--INR appears to have stabilized Most recently 2.27-- Update INR is pending   #2-hypertension- continues on labetalol and lisinopril I do not see consistent elevations -recent blood pressures 132/86-133/82  -   #3 history DVT-again continues on anticoagulation as noted above--update INR is pending-  #4-history of pneumonia---bronchitis--this is a stable he is on routine nebulizers as well as when necessary and is on a  when necessary expectorant--- recently completed a course of Zithromax for suspected URI  #5-constipation-this appears stable current medications.  He is on lactulose.  #6-hyperlipidemia-again conservative here secondary to history of fatty liver disease--continue to monitor at periodic intervals--last lipid panel in April 2017 shows stability cholesterol 132 triglyceride 62 LDL 87 HDL of 33 recommendation was to check these annually  [id number]

## 2016-06-07 ENCOUNTER — Encounter (HOSPITAL_COMMUNITY)
Admission: RE | Admit: 2016-06-07 | Discharge: 2016-06-07 | Disposition: A | Discharge status: Home / Self Care | Payer: Medicare Other | Source: Skilled Nursing Facility | Attending: Internal Medicine | Admitting: Internal Medicine

## 2016-06-07 DIAGNOSIS — I10 Essential (primary) hypertension: Secondary | ICD-10-CM | POA: Diagnosis not present

## 2016-06-07 DIAGNOSIS — I69998 Other sequelae following unspecified cerebrovascular disease: Secondary | ICD-10-CM | POA: Diagnosis not present

## 2016-06-07 DIAGNOSIS — Z7901 Long term (current) use of anticoagulants: Secondary | ICD-10-CM | POA: Insufficient documentation

## 2016-06-07 DIAGNOSIS — R293 Abnormal posture: Secondary | ICD-10-CM | POA: Diagnosis not present

## 2016-06-07 DIAGNOSIS — G8191 Hemiplegia, unspecified affecting right dominant side: Secondary | ICD-10-CM | POA: Diagnosis not present

## 2016-06-07 LAB — PROTIME-INR
INR: 2.49
Prothrombin Time: 27.4 seconds — ABNORMAL HIGH (ref 11.4–15.2)

## 2016-06-17 DIAGNOSIS — M79675 Pain in left toe(s): Secondary | ICD-10-CM | POA: Diagnosis not present

## 2016-06-17 DIAGNOSIS — B351 Tinea unguium: Secondary | ICD-10-CM | POA: Diagnosis not present

## 2016-06-17 DIAGNOSIS — M79674 Pain in right toe(s): Secondary | ICD-10-CM | POA: Diagnosis not present

## 2016-06-17 DIAGNOSIS — I70203 Unspecified atherosclerosis of native arteries of extremities, bilateral legs: Secondary | ICD-10-CM | POA: Diagnosis not present

## 2016-06-21 ENCOUNTER — Encounter (HOSPITAL_COMMUNITY)
Admission: RE | Admit: 2016-06-21 | Discharge: 2016-06-21 | Disposition: A | Discharge status: Home / Self Care | Payer: Medicare Other | Source: Skilled Nursing Facility | Attending: Internal Medicine | Admitting: Internal Medicine

## 2016-06-21 DIAGNOSIS — I69998 Other sequelae following unspecified cerebrovascular disease: Secondary | ICD-10-CM | POA: Diagnosis not present

## 2016-06-21 DIAGNOSIS — G8191 Hemiplegia, unspecified affecting right dominant side: Secondary | ICD-10-CM | POA: Insufficient documentation

## 2016-06-21 DIAGNOSIS — I10 Essential (primary) hypertension: Secondary | ICD-10-CM | POA: Diagnosis not present

## 2016-06-21 DIAGNOSIS — Z7901 Long term (current) use of anticoagulants: Secondary | ICD-10-CM | POA: Insufficient documentation

## 2016-06-21 DIAGNOSIS — R293 Abnormal posture: Secondary | ICD-10-CM | POA: Insufficient documentation

## 2016-06-21 LAB — PROTIME-INR
INR: 4.28 — AB
Prothrombin Time: 43.3 seconds — ABNORMAL HIGH (ref 11.4–15.2)

## 2016-06-22 ENCOUNTER — Encounter (HOSPITAL_COMMUNITY)
Admission: RE | Admit: 2016-06-22 | Discharge: 2016-06-22 | Disposition: A | Discharge status: Home / Self Care | Payer: Medicare Other | Source: Skilled Nursing Facility | Attending: Internal Medicine | Admitting: Internal Medicine

## 2016-06-22 DIAGNOSIS — Z7901 Long term (current) use of anticoagulants: Secondary | ICD-10-CM | POA: Diagnosis not present

## 2016-06-22 DIAGNOSIS — I69998 Other sequelae following unspecified cerebrovascular disease: Secondary | ICD-10-CM | POA: Diagnosis not present

## 2016-06-22 DIAGNOSIS — R293 Abnormal posture: Secondary | ICD-10-CM | POA: Diagnosis not present

## 2016-06-22 DIAGNOSIS — G8191 Hemiplegia, unspecified affecting right dominant side: Secondary | ICD-10-CM | POA: Diagnosis not present

## 2016-06-22 DIAGNOSIS — I10 Essential (primary) hypertension: Secondary | ICD-10-CM | POA: Diagnosis not present

## 2016-06-22 LAB — PROTIME-INR
INR: 3.38
Prothrombin Time: 35 seconds — ABNORMAL HIGH (ref 11.4–15.2)

## 2016-06-24 ENCOUNTER — Encounter (HOSPITAL_COMMUNITY)
Admission: RE | Admit: 2016-06-24 | Discharge: 2016-06-24 | Disposition: A | Discharge status: Home / Self Care | Payer: Medicare Other | Source: Skilled Nursing Facility | Attending: Internal Medicine | Admitting: Internal Medicine

## 2016-06-24 DIAGNOSIS — G8191 Hemiplegia, unspecified affecting right dominant side: Secondary | ICD-10-CM | POA: Diagnosis not present

## 2016-06-24 DIAGNOSIS — I10 Essential (primary) hypertension: Secondary | ICD-10-CM | POA: Diagnosis not present

## 2016-06-24 DIAGNOSIS — I69998 Other sequelae following unspecified cerebrovascular disease: Secondary | ICD-10-CM | POA: Diagnosis not present

## 2016-06-24 DIAGNOSIS — L308 Other specified dermatitis: Secondary | ICD-10-CM | POA: Diagnosis not present

## 2016-06-24 DIAGNOSIS — I872 Venous insufficiency (chronic) (peripheral): Secondary | ICD-10-CM | POA: Diagnosis not present

## 2016-06-24 DIAGNOSIS — R293 Abnormal posture: Secondary | ICD-10-CM | POA: Diagnosis not present

## 2016-06-24 DIAGNOSIS — Z7901 Long term (current) use of anticoagulants: Secondary | ICD-10-CM | POA: Diagnosis not present

## 2016-06-24 LAB — PROTIME-INR
INR: 1.96
PROTHROMBIN TIME: 22.6 s — AB (ref 11.4–15.2)

## 2016-06-28 ENCOUNTER — Encounter (HOSPITAL_COMMUNITY)
Admission: RE | Admit: 2016-06-28 | Discharge: 2016-06-28 | Disposition: A | Discharge status: Home / Self Care | Payer: Medicare Other | Source: Skilled Nursing Facility | Attending: Internal Medicine | Admitting: Internal Medicine

## 2016-06-28 DIAGNOSIS — Z7901 Long term (current) use of anticoagulants: Secondary | ICD-10-CM | POA: Diagnosis not present

## 2016-06-28 DIAGNOSIS — I10 Essential (primary) hypertension: Secondary | ICD-10-CM | POA: Diagnosis not present

## 2016-06-28 DIAGNOSIS — G8191 Hemiplegia, unspecified affecting right dominant side: Secondary | ICD-10-CM | POA: Diagnosis not present

## 2016-06-28 DIAGNOSIS — R293 Abnormal posture: Secondary | ICD-10-CM | POA: Diagnosis not present

## 2016-06-28 DIAGNOSIS — I69998 Other sequelae following unspecified cerebrovascular disease: Secondary | ICD-10-CM | POA: Diagnosis not present

## 2016-06-28 LAB — PROTIME-INR
INR: 2.06
Prothrombin Time: 23.5 seconds — ABNORMAL HIGH (ref 11.4–15.2)

## 2016-07-05 ENCOUNTER — Encounter (HOSPITAL_COMMUNITY)
Admission: RE | Admit: 2016-07-05 | Discharge: 2016-07-05 | Disposition: A | Discharge status: Home / Self Care | Payer: Medicare Other | Source: Skilled Nursing Facility | Attending: Internal Medicine | Admitting: Internal Medicine

## 2016-07-05 DIAGNOSIS — Z7901 Long term (current) use of anticoagulants: Secondary | ICD-10-CM | POA: Diagnosis not present

## 2016-07-05 DIAGNOSIS — I10 Essential (primary) hypertension: Secondary | ICD-10-CM | POA: Diagnosis not present

## 2016-07-05 DIAGNOSIS — I69998 Other sequelae following unspecified cerebrovascular disease: Secondary | ICD-10-CM | POA: Diagnosis not present

## 2016-07-05 DIAGNOSIS — G8191 Hemiplegia, unspecified affecting right dominant side: Secondary | ICD-10-CM | POA: Diagnosis not present

## 2016-07-05 DIAGNOSIS — R293 Abnormal posture: Secondary | ICD-10-CM | POA: Diagnosis not present

## 2016-07-05 LAB — PROTIME-INR
INR: 2.48
PROTHROMBIN TIME: 27.3 s — AB (ref 11.4–15.2)

## 2016-07-09 DIAGNOSIS — G8191 Hemiplegia, unspecified affecting right dominant side: Secondary | ICD-10-CM | POA: Diagnosis not present

## 2016-07-09 DIAGNOSIS — Z993 Dependence on wheelchair: Secondary | ICD-10-CM | POA: Diagnosis not present

## 2016-07-09 DIAGNOSIS — I69998 Other sequelae following unspecified cerebrovascular disease: Secondary | ICD-10-CM | POA: Diagnosis not present

## 2016-07-09 DIAGNOSIS — R293 Abnormal posture: Secondary | ICD-10-CM | POA: Diagnosis not present

## 2016-07-09 DIAGNOSIS — I10 Essential (primary) hypertension: Secondary | ICD-10-CM | POA: Diagnosis not present

## 2016-07-13 DIAGNOSIS — G8191 Hemiplegia, unspecified affecting right dominant side: Secondary | ICD-10-CM | POA: Diagnosis not present

## 2016-07-13 DIAGNOSIS — R293 Abnormal posture: Secondary | ICD-10-CM | POA: Diagnosis not present

## 2016-07-13 DIAGNOSIS — I69998 Other sequelae following unspecified cerebrovascular disease: Secondary | ICD-10-CM | POA: Diagnosis not present

## 2016-07-13 DIAGNOSIS — Z993 Dependence on wheelchair: Secondary | ICD-10-CM | POA: Diagnosis not present

## 2016-07-13 DIAGNOSIS — I10 Essential (primary) hypertension: Secondary | ICD-10-CM | POA: Diagnosis not present

## 2016-07-14 DIAGNOSIS — I10 Essential (primary) hypertension: Secondary | ICD-10-CM | POA: Diagnosis not present

## 2016-07-14 DIAGNOSIS — Z993 Dependence on wheelchair: Secondary | ICD-10-CM | POA: Diagnosis not present

## 2016-07-14 DIAGNOSIS — R293 Abnormal posture: Secondary | ICD-10-CM | POA: Diagnosis not present

## 2016-07-14 DIAGNOSIS — G8191 Hemiplegia, unspecified affecting right dominant side: Secondary | ICD-10-CM | POA: Diagnosis not present

## 2016-07-14 DIAGNOSIS — I69998 Other sequelae following unspecified cerebrovascular disease: Secondary | ICD-10-CM | POA: Diagnosis not present

## 2016-07-16 DIAGNOSIS — G8191 Hemiplegia, unspecified affecting right dominant side: Secondary | ICD-10-CM | POA: Diagnosis not present

## 2016-07-16 DIAGNOSIS — I10 Essential (primary) hypertension: Secondary | ICD-10-CM | POA: Diagnosis not present

## 2016-07-16 DIAGNOSIS — Z993 Dependence on wheelchair: Secondary | ICD-10-CM | POA: Diagnosis not present

## 2016-07-16 DIAGNOSIS — R293 Abnormal posture: Secondary | ICD-10-CM | POA: Diagnosis not present

## 2016-07-16 DIAGNOSIS — I69998 Other sequelae following unspecified cerebrovascular disease: Secondary | ICD-10-CM | POA: Diagnosis not present

## 2016-07-19 ENCOUNTER — Encounter (HOSPITAL_COMMUNITY)
Admission: RE | Admit: 2016-07-19 | Discharge: 2016-07-19 | Disposition: A | Discharge status: Home / Self Care | Payer: Medicare Other | Source: Skilled Nursing Facility | Attending: *Deleted | Admitting: *Deleted

## 2016-07-19 DIAGNOSIS — I10 Essential (primary) hypertension: Secondary | ICD-10-CM | POA: Diagnosis not present

## 2016-07-19 DIAGNOSIS — I69998 Other sequelae following unspecified cerebrovascular disease: Secondary | ICD-10-CM | POA: Diagnosis not present

## 2016-07-19 DIAGNOSIS — Z7901 Long term (current) use of anticoagulants: Secondary | ICD-10-CM | POA: Diagnosis not present

## 2016-07-19 DIAGNOSIS — G8191 Hemiplegia, unspecified affecting right dominant side: Secondary | ICD-10-CM | POA: Diagnosis not present

## 2016-07-19 DIAGNOSIS — R293 Abnormal posture: Secondary | ICD-10-CM | POA: Diagnosis not present

## 2016-07-19 DIAGNOSIS — Z993 Dependence on wheelchair: Secondary | ICD-10-CM | POA: Diagnosis not present

## 2016-07-19 LAB — PROTIME-INR
INR: 2.98
PROTHROMBIN TIME: 31.6 s — AB (ref 11.4–15.2)

## 2016-07-21 DIAGNOSIS — Z993 Dependence on wheelchair: Secondary | ICD-10-CM | POA: Diagnosis not present

## 2016-07-21 DIAGNOSIS — I69998 Other sequelae following unspecified cerebrovascular disease: Secondary | ICD-10-CM | POA: Diagnosis not present

## 2016-07-21 DIAGNOSIS — I10 Essential (primary) hypertension: Secondary | ICD-10-CM | POA: Diagnosis not present

## 2016-07-21 DIAGNOSIS — R293 Abnormal posture: Secondary | ICD-10-CM | POA: Diagnosis not present

## 2016-07-21 DIAGNOSIS — G8191 Hemiplegia, unspecified affecting right dominant side: Secondary | ICD-10-CM | POA: Diagnosis not present

## 2016-07-22 ENCOUNTER — Encounter (HOSPITAL_COMMUNITY)
Admission: RE | Admit: 2016-07-22 | Discharge: 2016-07-22 | Disposition: A | Discharge status: Home / Self Care | Payer: Medicare Other | Source: Skilled Nursing Facility | Attending: Internal Medicine | Admitting: Internal Medicine

## 2016-07-22 DIAGNOSIS — R293 Abnormal posture: Secondary | ICD-10-CM | POA: Insufficient documentation

## 2016-07-22 DIAGNOSIS — Z7901 Long term (current) use of anticoagulants: Secondary | ICD-10-CM | POA: Insufficient documentation

## 2016-07-22 DIAGNOSIS — I69998 Other sequelae following unspecified cerebrovascular disease: Secondary | ICD-10-CM | POA: Diagnosis not present

## 2016-07-22 DIAGNOSIS — G8191 Hemiplegia, unspecified affecting right dominant side: Secondary | ICD-10-CM | POA: Diagnosis not present

## 2016-07-22 DIAGNOSIS — I10 Essential (primary) hypertension: Secondary | ICD-10-CM | POA: Diagnosis not present

## 2016-07-22 LAB — PROTIME-INR
INR: 2.83
PROTHROMBIN TIME: 30.3 s — AB (ref 11.4–15.2)

## 2016-07-23 DIAGNOSIS — I10 Essential (primary) hypertension: Secondary | ICD-10-CM | POA: Diagnosis not present

## 2016-07-23 DIAGNOSIS — G8191 Hemiplegia, unspecified affecting right dominant side: Secondary | ICD-10-CM | POA: Diagnosis not present

## 2016-07-23 DIAGNOSIS — Z993 Dependence on wheelchair: Secondary | ICD-10-CM | POA: Diagnosis not present

## 2016-07-23 DIAGNOSIS — I69998 Other sequelae following unspecified cerebrovascular disease: Secondary | ICD-10-CM | POA: Diagnosis not present

## 2016-07-23 DIAGNOSIS — R293 Abnormal posture: Secondary | ICD-10-CM | POA: Diagnosis not present

## 2016-07-26 DIAGNOSIS — R293 Abnormal posture: Secondary | ICD-10-CM | POA: Diagnosis not present

## 2016-07-26 DIAGNOSIS — I69998 Other sequelae following unspecified cerebrovascular disease: Secondary | ICD-10-CM | POA: Diagnosis not present

## 2016-07-26 DIAGNOSIS — I10 Essential (primary) hypertension: Secondary | ICD-10-CM | POA: Diagnosis not present

## 2016-07-26 DIAGNOSIS — G8191 Hemiplegia, unspecified affecting right dominant side: Secondary | ICD-10-CM | POA: Diagnosis not present

## 2016-07-26 DIAGNOSIS — Z993 Dependence on wheelchair: Secondary | ICD-10-CM | POA: Diagnosis not present

## 2016-07-27 ENCOUNTER — Non-Acute Institutional Stay (SKILLED_NURSING_FACILITY): Payer: Medicare Other | Admitting: Internal Medicine

## 2016-07-27 ENCOUNTER — Encounter: Payer: Self-pay | Admitting: Internal Medicine

## 2016-07-27 DIAGNOSIS — J208 Acute bronchitis due to other specified organisms: Secondary | ICD-10-CM

## 2016-07-27 DIAGNOSIS — I2782 Chronic pulmonary embolism: Secondary | ICD-10-CM | POA: Diagnosis not present

## 2016-07-27 DIAGNOSIS — R509 Fever, unspecified: Secondary | ICD-10-CM

## 2016-07-27 DIAGNOSIS — I639 Cerebral infarction, unspecified: Secondary | ICD-10-CM | POA: Diagnosis not present

## 2016-07-27 DIAGNOSIS — R05 Cough: Secondary | ICD-10-CM | POA: Diagnosis not present

## 2016-07-27 DIAGNOSIS — R0989 Other specified symptoms and signs involving the circulatory and respiratory systems: Secondary | ICD-10-CM | POA: Diagnosis not present

## 2016-07-27 DIAGNOSIS — I10 Essential (primary) hypertension: Secondary | ICD-10-CM | POA: Diagnosis not present

## 2016-07-27 NOTE — Progress Notes (Signed)
Location:   [facility] Room Number: 112/W Place of Service:  SNF [phone]) Provider:  Pollyann Savoy, MD  [name], [name]  Extended Emergency Contact Information Primary Emergency Contact: [name] Address: [address]          [place], [place] [address] [place] Home Phone: [phone] Work Phone: [phone] Relation: Brother  Code Status:  Full Code Goals of care: Advanced Directive information Advanced Directives 07/27/2016  Does Patient Have a Medical Advance Directive? Yes  Type of Advance Directive (No Data)  Does patient want to make changes to medical advance directive? No - Patient declined  Copy of Healthcare Power of Attorney in Chart? -     Chief Complaint  Patient presents with  . Medical Management of Chronic Issues    Routine Visit    HPI:  Pt is a 71 y.o. male seen today for medical management of chronic diseases.    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 Hypertension and COPD.On reviewing his Chart he has been on Chronic Coumadin since 2014 and has been continued due to his CVA .  Patient has been doing well in facility. He did not have any new complains today. He did have low grade fever this morning and has this productive cough again. He denies any SOB, Chest pain. He is eating well and has no Chills or Myalgia. Patient weight has been around 199 lbs which is 2 lbs less then his baseline weight. He moves around in the wheel chair. Past Medical History:  Diagnosis Date  . Dysphasia   . Fatty liver   . Hypertension   . Pneumonia   . Pulmonary embolism (HCC)   . Stroke [facility])    Past Surgical History:  Procedure Laterality Date  . unable      Allergies  Allergen Reactions  . Penicillins     Current Outpatient Prescriptions on File Prior to Visit  Medication Sig  Dispense Refill  . dextromethorphan 15 MG/5ML syrup Take 5 mLs by mouth every 6 (six) hours as needed for cough.    . Eyelid Cleansers (SYSTANE LID WIPES EX) Start occusoft lid scrubs: clean eyelids of oil and debris BID OU ( each eye ) before administering eye drops.    Marland Kitchen ipratropium-albuterol (DUONEB) 0.5-2.5 (3) MG/3ML SOLN 1 unit dose vial twice a day VIA NEBULIZER (DX; CHRONIC HYPOXIA )    . labetalol (NORMODYNE) 200 MG tablet Take 200 mg by mouth daily. For HTN    . lactulose (CHRONULAC) 10 GM/15ML solution Give 15cc by mouth twice daily For constipation    . lisinopril (PRINIVIL,ZESTRIL) 10 MG tablet Take 20 mg by mouth daily. For HTN    . loratadine (CLARITIN) 10 MG tablet Take 10 mg by mouth daily.    . polyvinyl alcohol (LIQUIFILM TEARS) 1.4 % ophthalmic solution Place 1 drop into both eyes 2 (two) times daily.    . sennosides-docusate sodium (SENOKOT-S) 8.6-50 MG tablet Take 1 tablet by mouth at bedtime. For constipation    . White Petrolatum-Mineral Oil (SYSTANE NIGHTTIME) OINT Apply to eye. Apply to left eye at night     No current facility-administered medications on file prior to visit.      Review of Systems  Constitutional: Negative.   HENT: Positive for congestion, drooling, postnasal drip and rhinorrhea.   Respiratory:  Positive for cough. Negative for apnea, chest tightness and shortness of breath.   Cardiovascular: Positive for leg swelling. Negative for chest pain and palpitations.  Gastrointestinal: Negative.   Genitourinary: Negative for dysuria and frequency.  Musculoskeletal: Negative.   Skin: Negative.   Neurological: Negative.   Psychiatric/Behavioral: Negative.     Immunization History  Administered Date(s) Administered  . Influenza-Unspecified 03/28/2014, 03/23/2016  . Pneumococcal-Unspecified 11/01/2009, 03/30/2016   Pertinent  Health Maintenance Due  Topic Date Due  . COLONOSCOPY  10/30/2025 (Originally 11/08/1995)  . PNA vac Low Risk Adult (2 of 2 -  PCV13) 03/30/2017  . INFLUENZA VACCINE  Completed   No flowsheet data found. Functional Status Survey:    Vitals:   07/27/16 1010  BP: (!) 142/78  Pulse: 74  Resp: 20  Temp: 99.1 F (37.3 C)  TempSrc: Oral  SpO2: 99%   There is no height or weight on file to calculate BMI. Physical Exam  Constitutional: He appears well-developed and well-nourished.  HENT:  Head: Normocephalic.  Mouth/Throat: Oropharynx is clear and moist.  Eyes: Pupils are equal, round, and reactive to light.  Neck: Neck supple.  Cardiovascular: Normal rate, regular rhythm and normal heart sounds.   No murmur heard. Pulmonary/Chest: Effort normal.  Bilateral Expiratory Wheezing  Abdominal: Soft. Bowel sounds are normal. He exhibits no distension. There is no tenderness. There is no rebound.  Musculoskeletal:  Bilateral Edema with Chronic venous stasis.  Lymphadenopathy:    He has no cervical adenopathy.  Neurological: He is alert.  Patient has  2/5 on his Right upper and Lower extremities.  Skin:  Chronic dryness and Venous stasis changes in LE  Psychiatric: He has a normal mood and affect. His behavior is normal.    Labs reviewed:  Recent Labs  09/12/15 0715 12/19/15 0735 03/23/16 0730  NA 142 137 138  K 4.0 3.8 4.3  CL 105 103 106  CO2 29 30 28   GLUCOSE 76 75 77  BUN 18 20 15   CREATININE 1.07 1.12 1.08  CALCIUM 8.5* 8.3* 8.3*    Recent Labs  09/12/15 0715  AST 20  ALT 22  ALKPHOS 48  BILITOT 0.6  PROT 6.9  ALBUMIN 3.2*    Recent Labs  09/12/15 09/12/15 0715 12/19/15 0735 03/23/16 0730  WBC 8.0 8.0 8.6 7.4  NEUTROABS 63 4.9 5.0  --   HGB  --  14.5 15.0 15.5  HCT  --  44.9 45.6 46.5  MCV  --  94.5 94.2 93.8  PLT  --  292 277 262   Lab Results  Component Value Date   TSH 1.285 12/19/2015   Lab Results  Component Value Date   HGBA1C  03/27/2009    6.0 (NOTE) The ADA recommends the following therapeutic goal for glycemic control related to Hgb A1c measurement: Goal  of therapy: <6.5 Hgb A1c  Reference: American Diabetes Association: Clinical Practice Recommendations 2010, Diabetes Care, 2010, 33: (Suppl  1).   Lab Results  Component Value Date   CHOL 132 10/07/2015   HDL 33 (L) 10/07/2015   LDLCALC 87 10/07/2015   TRIG 62 10/07/2015   CHOLHDL 4.0 10/07/2015    Significant Diagnostic Results in last 30 days:  No results found.  Assessment/Plan  Low grade fever  With worsening Cough and Low grade temp will get C Xray to rule out Pneumonia. Check CBC If patient spike again will do Flu screening . Right now patient seems stable.  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 His INR  Has been stable on adjusted dose of Coumadin.  S/P CVA with Right Hemiplegia BP stable. On Chronic Coumadin Check Fasting lipid profile to monitor LDL was less then 100 in 09/2015. Not on any statin.    Family/ staff Communication:   Labs/tests ordered:  CBC, CMP, Fasting Lipid, Chest Xray.

## 2016-07-28 ENCOUNTER — Encounter (HOSPITAL_COMMUNITY)
Admission: RE | Admit: 2016-07-28 | Discharge: 2016-07-28 | Disposition: A | Discharge status: Home / Self Care | Payer: Medicare Other | Source: Skilled Nursing Facility | Attending: Internal Medicine | Admitting: Internal Medicine

## 2016-07-28 DIAGNOSIS — Z993 Dependence on wheelchair: Secondary | ICD-10-CM | POA: Diagnosis not present

## 2016-07-28 DIAGNOSIS — G8191 Hemiplegia, unspecified affecting right dominant side: Secondary | ICD-10-CM | POA: Diagnosis not present

## 2016-07-28 DIAGNOSIS — R293 Abnormal posture: Secondary | ICD-10-CM | POA: Insufficient documentation

## 2016-07-28 DIAGNOSIS — I10 Essential (primary) hypertension: Secondary | ICD-10-CM | POA: Insufficient documentation

## 2016-07-28 DIAGNOSIS — Z7901 Long term (current) use of anticoagulants: Secondary | ICD-10-CM | POA: Insufficient documentation

## 2016-07-28 DIAGNOSIS — I69998 Other sequelae following unspecified cerebrovascular disease: Secondary | ICD-10-CM | POA: Insufficient documentation

## 2016-07-28 LAB — LIPID PANEL
Cholesterol: 117 mg/dL (ref 0–200)
HDL: 33 mg/dL — ABNORMAL LOW (ref >40)
LDL CALC: 75 mg/dL (ref 0–99)
TRIGLYCERIDES: 45 mg/dL (ref <150)
Total CHOL/HDL Ratio: 3.5 RATIO
VLDL: 9 mg/dL (ref 0–40)

## 2016-07-28 LAB — CBC WITH DIFFERENTIAL/PLATELET
Basophils Absolute: 0 10*3/uL (ref 0.0–0.1)
Basophils Relative: 0 %
EOS ABS: 0.3 10*3/uL (ref 0.0–0.7)
EOS PCT: 4 %
HCT: 44.8 % (ref 39.0–52.0)
Hemoglobin: 14.9 g/dL (ref 13.0–17.0)
LYMPHS ABS: 2.1 10*3/uL (ref 0.7–4.0)
LYMPHS PCT: 25 %
MCH: 30.8 pg (ref 26.0–34.0)
MCHC: 33.3 g/dL (ref 30.0–36.0)
MCV: 92.8 fL (ref 78.0–100.0)
MONO ABS: 0.6 10*3/uL (ref 0.1–1.0)
Monocytes Relative: 8 %
Neutro Abs: 5.4 10*3/uL (ref 1.7–7.7)
Neutrophils Relative %: 63 %
PLATELETS: 278 10*3/uL (ref 150–400)
RBC: 4.83 MIL/uL (ref 4.22–5.81)
RDW: 13.4 % (ref 11.5–15.5)
WBC: 8.4 10*3/uL (ref 4.0–10.5)

## 2016-07-28 LAB — COMPREHENSIVE METABOLIC PANEL
ALT: 29 U/L (ref 17–63)
AST: 23 U/L (ref 15–41)
Albumin: 3.3 g/dL — ABNORMAL LOW (ref 3.5–5.0)
Alkaline Phosphatase: 59 U/L (ref 38–126)
Anion gap: 9 (ref 5–15)
BUN: 19 mg/dL (ref 6–20)
CHLORIDE: 102 mmol/L (ref 101–111)
CO2: 27 mmol/L (ref 22–32)
CREATININE: 1.07 mg/dL (ref 0.61–1.24)
Calcium: 8.5 mg/dL — ABNORMAL LOW (ref 8.9–10.3)
GFR calc Af Amer: >60 mL/min (ref >60)
Glucose, Bld: 87 mg/dL (ref 65–99)
POTASSIUM: 4 mmol/L (ref 3.5–5.1)
SODIUM: 138 mmol/L (ref 135–145)
Total Bilirubin: 0.4 mg/dL (ref 0.3–1.2)
Total Protein: 7 g/dL (ref 6.5–8.1)

## 2016-07-28 LAB — PROTIME-INR
INR: 3.96
PROTHROMBIN TIME: 39.7 s — AB (ref 11.4–15.2)

## 2016-07-29 ENCOUNTER — Encounter (HOSPITAL_COMMUNITY)
Admission: RE | Admit: 2016-07-29 | Discharge: 2016-07-29 | Disposition: A | Discharge status: Home / Self Care | Payer: Medicare Other | Source: Skilled Nursing Facility | Attending: Pediatrics | Admitting: Pediatrics

## 2016-07-29 DIAGNOSIS — Z7901 Long term (current) use of anticoagulants: Secondary | ICD-10-CM | POA: Insufficient documentation

## 2016-07-29 DIAGNOSIS — G8191 Hemiplegia, unspecified affecting right dominant side: Secondary | ICD-10-CM | POA: Insufficient documentation

## 2016-07-29 DIAGNOSIS — I10 Essential (primary) hypertension: Secondary | ICD-10-CM | POA: Diagnosis not present

## 2016-07-29 DIAGNOSIS — R293 Abnormal posture: Secondary | ICD-10-CM | POA: Insufficient documentation

## 2016-07-29 DIAGNOSIS — I69998 Other sequelae following unspecified cerebrovascular disease: Secondary | ICD-10-CM | POA: Insufficient documentation

## 2016-07-29 LAB — PROTIME-INR
INR: 3.13
PROTHROMBIN TIME: 32.9 s — AB (ref 11.4–15.2)

## 2016-07-30 ENCOUNTER — Encounter (HOSPITAL_COMMUNITY)
Admission: RE | Admit: 2016-07-30 | Discharge: 2016-07-30 | Disposition: A | Discharge status: Home / Self Care | Payer: Medicare Other | Source: Skilled Nursing Facility | Attending: Internal Medicine | Admitting: Internal Medicine

## 2016-07-30 DIAGNOSIS — Z993 Dependence on wheelchair: Secondary | ICD-10-CM | POA: Diagnosis not present

## 2016-07-30 DIAGNOSIS — Z7901 Long term (current) use of anticoagulants: Secondary | ICD-10-CM | POA: Insufficient documentation

## 2016-07-30 DIAGNOSIS — I69998 Other sequelae following unspecified cerebrovascular disease: Secondary | ICD-10-CM | POA: Diagnosis not present

## 2016-07-30 DIAGNOSIS — I10 Essential (primary) hypertension: Secondary | ICD-10-CM | POA: Insufficient documentation

## 2016-07-30 DIAGNOSIS — G8191 Hemiplegia, unspecified affecting right dominant side: Secondary | ICD-10-CM | POA: Insufficient documentation

## 2016-07-30 DIAGNOSIS — R293 Abnormal posture: Secondary | ICD-10-CM | POA: Diagnosis not present

## 2016-07-30 LAB — PROTIME-INR
INR: 2.47
PROTHROMBIN TIME: 27.2 s — AB (ref 11.4–15.2)

## 2016-08-02 ENCOUNTER — Encounter (HOSPITAL_COMMUNITY)
Admission: RE | Admit: 2016-08-02 | Discharge: 2016-08-02 | Disposition: A | Discharge status: Home / Self Care | Payer: Medicare Other | Source: Skilled Nursing Facility | Attending: Internal Medicine | Admitting: Internal Medicine

## 2016-08-02 DIAGNOSIS — G8191 Hemiplegia, unspecified affecting right dominant side: Secondary | ICD-10-CM | POA: Insufficient documentation

## 2016-08-02 DIAGNOSIS — Z7901 Long term (current) use of anticoagulants: Secondary | ICD-10-CM | POA: Diagnosis not present

## 2016-08-02 DIAGNOSIS — I10 Essential (primary) hypertension: Secondary | ICD-10-CM | POA: Insufficient documentation

## 2016-08-02 DIAGNOSIS — R293 Abnormal posture: Secondary | ICD-10-CM | POA: Insufficient documentation

## 2016-08-02 DIAGNOSIS — I69998 Other sequelae following unspecified cerebrovascular disease: Secondary | ICD-10-CM | POA: Diagnosis not present

## 2016-08-02 DIAGNOSIS — Z993 Dependence on wheelchair: Secondary | ICD-10-CM | POA: Diagnosis not present

## 2016-08-02 LAB — PROTIME-INR
INR: 1.6
Prothrombin Time: 19.2 seconds — ABNORMAL HIGH (ref 11.4–15.2)

## 2016-08-06 DIAGNOSIS — G8191 Hemiplegia, unspecified affecting right dominant side: Secondary | ICD-10-CM | POA: Diagnosis not present

## 2016-08-06 DIAGNOSIS — I69998 Other sequelae following unspecified cerebrovascular disease: Secondary | ICD-10-CM | POA: Diagnosis not present

## 2016-08-06 DIAGNOSIS — I10 Essential (primary) hypertension: Secondary | ICD-10-CM | POA: Diagnosis not present

## 2016-08-06 DIAGNOSIS — Z993 Dependence on wheelchair: Secondary | ICD-10-CM | POA: Diagnosis not present

## 2016-08-06 DIAGNOSIS — R293 Abnormal posture: Secondary | ICD-10-CM | POA: Diagnosis not present

## 2016-08-09 DIAGNOSIS — I69998 Other sequelae following unspecified cerebrovascular disease: Secondary | ICD-10-CM | POA: Diagnosis not present

## 2016-08-09 DIAGNOSIS — Z993 Dependence on wheelchair: Secondary | ICD-10-CM | POA: Diagnosis not present

## 2016-08-09 DIAGNOSIS — R293 Abnormal posture: Secondary | ICD-10-CM | POA: Diagnosis not present

## 2016-08-09 DIAGNOSIS — I10 Essential (primary) hypertension: Secondary | ICD-10-CM | POA: Diagnosis not present

## 2016-08-09 DIAGNOSIS — G8191 Hemiplegia, unspecified affecting right dominant side: Secondary | ICD-10-CM | POA: Diagnosis not present

## 2016-08-11 DIAGNOSIS — Z993 Dependence on wheelchair: Secondary | ICD-10-CM | POA: Diagnosis not present

## 2016-08-11 DIAGNOSIS — R293 Abnormal posture: Secondary | ICD-10-CM | POA: Diagnosis not present

## 2016-08-11 DIAGNOSIS — G8191 Hemiplegia, unspecified affecting right dominant side: Secondary | ICD-10-CM | POA: Diagnosis not present

## 2016-08-11 DIAGNOSIS — I10 Essential (primary) hypertension: Secondary | ICD-10-CM | POA: Diagnosis not present

## 2016-08-11 DIAGNOSIS — I69998 Other sequelae following unspecified cerebrovascular disease: Secondary | ICD-10-CM | POA: Diagnosis not present

## 2016-08-13 DIAGNOSIS — G8191 Hemiplegia, unspecified affecting right dominant side: Secondary | ICD-10-CM | POA: Diagnosis not present

## 2016-08-13 DIAGNOSIS — Z993 Dependence on wheelchair: Secondary | ICD-10-CM | POA: Diagnosis not present

## 2016-08-13 DIAGNOSIS — I69998 Other sequelae following unspecified cerebrovascular disease: Secondary | ICD-10-CM | POA: Diagnosis not present

## 2016-08-13 DIAGNOSIS — R293 Abnormal posture: Secondary | ICD-10-CM | POA: Diagnosis not present

## 2016-08-13 DIAGNOSIS — I10 Essential (primary) hypertension: Secondary | ICD-10-CM | POA: Diagnosis not present

## 2016-08-16 ENCOUNTER — Encounter (HOSPITAL_COMMUNITY)
Admission: RE | Admit: 2016-08-16 | Discharge: 2016-08-16 | Disposition: A | Discharge status: Home / Self Care | Payer: Medicare Other | Source: Skilled Nursing Facility | Attending: Internal Medicine | Admitting: Internal Medicine

## 2016-08-16 DIAGNOSIS — R293 Abnormal posture: Secondary | ICD-10-CM | POA: Diagnosis not present

## 2016-08-16 DIAGNOSIS — I69998 Other sequelae following unspecified cerebrovascular disease: Secondary | ICD-10-CM | POA: Diagnosis not present

## 2016-08-16 DIAGNOSIS — Z7901 Long term (current) use of anticoagulants: Secondary | ICD-10-CM | POA: Insufficient documentation

## 2016-08-16 DIAGNOSIS — G8191 Hemiplegia, unspecified affecting right dominant side: Secondary | ICD-10-CM | POA: Insufficient documentation

## 2016-08-16 DIAGNOSIS — Z993 Dependence on wheelchair: Secondary | ICD-10-CM | POA: Diagnosis not present

## 2016-08-16 DIAGNOSIS — I10 Essential (primary) hypertension: Secondary | ICD-10-CM | POA: Insufficient documentation

## 2016-08-16 LAB — PROTIME-INR
INR: 2.25
Prothrombin Time: 25.2 seconds — ABNORMAL HIGH (ref 11.4–15.2)

## 2016-08-18 DIAGNOSIS — I10 Essential (primary) hypertension: Secondary | ICD-10-CM | POA: Diagnosis not present

## 2016-08-18 DIAGNOSIS — R293 Abnormal posture: Secondary | ICD-10-CM | POA: Diagnosis not present

## 2016-08-18 DIAGNOSIS — I69998 Other sequelae following unspecified cerebrovascular disease: Secondary | ICD-10-CM | POA: Diagnosis not present

## 2016-08-18 DIAGNOSIS — G8191 Hemiplegia, unspecified affecting right dominant side: Secondary | ICD-10-CM | POA: Diagnosis not present

## 2016-08-18 DIAGNOSIS — Z993 Dependence on wheelchair: Secondary | ICD-10-CM | POA: Diagnosis not present

## 2016-08-23 ENCOUNTER — Encounter (HOSPITAL_COMMUNITY)
Admission: RE | Admit: 2016-08-23 | Discharge: 2016-08-23 | Disposition: A | Discharge status: Home / Self Care | Payer: Medicare Other | Source: Skilled Nursing Facility | Attending: Internal Medicine | Admitting: Internal Medicine

## 2016-08-23 DIAGNOSIS — Z993 Dependence on wheelchair: Secondary | ICD-10-CM | POA: Diagnosis not present

## 2016-08-23 DIAGNOSIS — R791 Abnormal coagulation profile: Secondary | ICD-10-CM | POA: Diagnosis not present

## 2016-08-23 DIAGNOSIS — I69998 Other sequelae following unspecified cerebrovascular disease: Secondary | ICD-10-CM | POA: Diagnosis not present

## 2016-08-23 DIAGNOSIS — R293 Abnormal posture: Secondary | ICD-10-CM | POA: Diagnosis not present

## 2016-08-23 DIAGNOSIS — G8191 Hemiplegia, unspecified affecting right dominant side: Secondary | ICD-10-CM | POA: Diagnosis not present

## 2016-08-23 DIAGNOSIS — I10 Essential (primary) hypertension: Secondary | ICD-10-CM | POA: Diagnosis not present

## 2016-08-23 LAB — PROTIME-INR
INR: 2.23
Prothrombin Time: 25.1 seconds — ABNORMAL HIGH (ref 11.4–15.2)

## 2016-08-25 DIAGNOSIS — R293 Abnormal posture: Secondary | ICD-10-CM | POA: Diagnosis not present

## 2016-08-25 DIAGNOSIS — I10 Essential (primary) hypertension: Secondary | ICD-10-CM | POA: Diagnosis not present

## 2016-08-25 DIAGNOSIS — I69998 Other sequelae following unspecified cerebrovascular disease: Secondary | ICD-10-CM | POA: Diagnosis not present

## 2016-08-25 DIAGNOSIS — Z993 Dependence on wheelchair: Secondary | ICD-10-CM | POA: Diagnosis not present

## 2016-08-25 DIAGNOSIS — G8191 Hemiplegia, unspecified affecting right dominant side: Secondary | ICD-10-CM | POA: Diagnosis not present

## 2016-08-27 DIAGNOSIS — I69998 Other sequelae following unspecified cerebrovascular disease: Secondary | ICD-10-CM | POA: Diagnosis not present

## 2016-08-27 DIAGNOSIS — Z993 Dependence on wheelchair: Secondary | ICD-10-CM | POA: Diagnosis not present

## 2016-08-27 DIAGNOSIS — R293 Abnormal posture: Secondary | ICD-10-CM | POA: Diagnosis not present

## 2016-08-27 DIAGNOSIS — I10 Essential (primary) hypertension: Secondary | ICD-10-CM | POA: Diagnosis not present

## 2016-08-27 DIAGNOSIS — G8191 Hemiplegia, unspecified affecting right dominant side: Secondary | ICD-10-CM | POA: Diagnosis not present

## 2016-08-30 ENCOUNTER — Encounter (HOSPITAL_COMMUNITY)
Admission: RE | Admit: 2016-08-30 | Discharge: 2016-08-30 | Disposition: A | Discharge status: Home / Self Care | Payer: Medicare Other | Source: Skilled Nursing Facility | Attending: Internal Medicine | Admitting: Internal Medicine

## 2016-08-30 DIAGNOSIS — I69998 Other sequelae following unspecified cerebrovascular disease: Secondary | ICD-10-CM | POA: Diagnosis not present

## 2016-08-30 DIAGNOSIS — I10 Essential (primary) hypertension: Secondary | ICD-10-CM | POA: Diagnosis not present

## 2016-08-30 DIAGNOSIS — G8191 Hemiplegia, unspecified affecting right dominant side: Secondary | ICD-10-CM | POA: Diagnosis not present

## 2016-08-30 DIAGNOSIS — Z7901 Long term (current) use of anticoagulants: Secondary | ICD-10-CM | POA: Insufficient documentation

## 2016-08-30 DIAGNOSIS — Z993 Dependence on wheelchair: Secondary | ICD-10-CM | POA: Diagnosis not present

## 2016-08-30 DIAGNOSIS — R293 Abnormal posture: Secondary | ICD-10-CM | POA: Diagnosis not present

## 2016-08-30 LAB — PROTIME-INR
INR: 2.88
Prothrombin Time: 30.8 seconds — ABNORMAL HIGH (ref 11.4–15.2)

## 2016-09-01 DIAGNOSIS — I69998 Other sequelae following unspecified cerebrovascular disease: Secondary | ICD-10-CM | POA: Diagnosis not present

## 2016-09-01 DIAGNOSIS — Z993 Dependence on wheelchair: Secondary | ICD-10-CM | POA: Diagnosis not present

## 2016-09-01 DIAGNOSIS — R293 Abnormal posture: Secondary | ICD-10-CM | POA: Diagnosis not present

## 2016-09-01 DIAGNOSIS — G8191 Hemiplegia, unspecified affecting right dominant side: Secondary | ICD-10-CM | POA: Diagnosis not present

## 2016-09-01 DIAGNOSIS — I10 Essential (primary) hypertension: Secondary | ICD-10-CM | POA: Diagnosis not present

## 2016-09-02 ENCOUNTER — Encounter (HOSPITAL_COMMUNITY)
Admission: RE | Admit: 2016-09-02 | Discharge: 2016-09-02 | Disposition: A | Discharge status: Home / Self Care | Payer: Medicare Other | Source: Skilled Nursing Facility | Attending: Internal Medicine | Admitting: Internal Medicine

## 2016-09-02 DIAGNOSIS — Z7901 Long term (current) use of anticoagulants: Secondary | ICD-10-CM | POA: Insufficient documentation

## 2016-09-02 DIAGNOSIS — G8191 Hemiplegia, unspecified affecting right dominant side: Secondary | ICD-10-CM | POA: Insufficient documentation

## 2016-09-02 DIAGNOSIS — I10 Essential (primary) hypertension: Secondary | ICD-10-CM | POA: Diagnosis not present

## 2016-09-02 LAB — PROTIME-INR
INR: 3.02
Prothrombin Time: 32 seconds — ABNORMAL HIGH (ref 11.4–15.2)

## 2016-09-03 DIAGNOSIS — R293 Abnormal posture: Secondary | ICD-10-CM | POA: Diagnosis not present

## 2016-09-03 DIAGNOSIS — G8191 Hemiplegia, unspecified affecting right dominant side: Secondary | ICD-10-CM | POA: Diagnosis not present

## 2016-09-03 DIAGNOSIS — I69998 Other sequelae following unspecified cerebrovascular disease: Secondary | ICD-10-CM | POA: Diagnosis not present

## 2016-09-03 DIAGNOSIS — Z993 Dependence on wheelchair: Secondary | ICD-10-CM | POA: Diagnosis not present

## 2016-09-03 DIAGNOSIS — I10 Essential (primary) hypertension: Secondary | ICD-10-CM | POA: Diagnosis not present

## 2016-09-06 ENCOUNTER — Encounter (HOSPITAL_COMMUNITY)
Admission: RE | Admit: 2016-09-06 | Discharge: 2016-09-06 | Disposition: A | Discharge status: Home / Self Care | Payer: Medicare Other | Source: Skilled Nursing Facility | Attending: Internal Medicine | Admitting: Internal Medicine

## 2016-09-06 DIAGNOSIS — Z7901 Long term (current) use of anticoagulants: Secondary | ICD-10-CM | POA: Diagnosis not present

## 2016-09-06 LAB — PROTIME-INR
INR: 2.85
Prothrombin Time: 30.5 seconds — ABNORMAL HIGH (ref 11.4–15.2)

## 2016-09-07 DIAGNOSIS — Z993 Dependence on wheelchair: Secondary | ICD-10-CM | POA: Diagnosis not present

## 2016-09-07 DIAGNOSIS — G8191 Hemiplegia, unspecified affecting right dominant side: Secondary | ICD-10-CM | POA: Diagnosis not present

## 2016-09-07 DIAGNOSIS — I69998 Other sequelae following unspecified cerebrovascular disease: Secondary | ICD-10-CM | POA: Diagnosis not present

## 2016-09-07 DIAGNOSIS — R293 Abnormal posture: Secondary | ICD-10-CM | POA: Diagnosis not present

## 2016-09-07 DIAGNOSIS — I10 Essential (primary) hypertension: Secondary | ICD-10-CM | POA: Diagnosis not present

## 2016-09-08 ENCOUNTER — Encounter (HOSPITAL_COMMUNITY)
Admission: RE | Admit: 2016-09-08 | Discharge: 2016-09-08 | Disposition: A | Discharge status: Home / Self Care | Payer: Medicare Other | Source: Skilled Nursing Facility | Attending: Internal Medicine | Admitting: Internal Medicine

## 2016-09-08 DIAGNOSIS — I69998 Other sequelae following unspecified cerebrovascular disease: Secondary | ICD-10-CM | POA: Diagnosis not present

## 2016-09-08 DIAGNOSIS — I10 Essential (primary) hypertension: Secondary | ICD-10-CM | POA: Diagnosis not present

## 2016-09-08 DIAGNOSIS — Z7901 Long term (current) use of anticoagulants: Secondary | ICD-10-CM | POA: Insufficient documentation

## 2016-09-08 DIAGNOSIS — G8191 Hemiplegia, unspecified affecting right dominant side: Secondary | ICD-10-CM | POA: Diagnosis not present

## 2016-09-08 DIAGNOSIS — Z993 Dependence on wheelchair: Secondary | ICD-10-CM | POA: Diagnosis not present

## 2016-09-08 DIAGNOSIS — R293 Abnormal posture: Secondary | ICD-10-CM | POA: Diagnosis not present

## 2016-09-08 LAB — PROTIME-INR
INR: 2.53
PROTHROMBIN TIME: 27.7 s — AB (ref 11.4–15.2)

## 2016-09-15 ENCOUNTER — Encounter (HOSPITAL_COMMUNITY)
Admission: RE | Admit: 2016-09-15 | Discharge: 2016-09-15 | Disposition: A | Discharge status: Home / Self Care | Payer: Medicare Other | Source: Skilled Nursing Facility | Attending: Internal Medicine | Admitting: Internal Medicine

## 2016-09-15 DIAGNOSIS — Z791 Long term (current) use of non-steroidal anti-inflammatories (NSAID): Secondary | ICD-10-CM | POA: Insufficient documentation

## 2016-09-15 LAB — PROTIME-INR
INR: 1.93
Prothrombin Time: 22.4 seconds — ABNORMAL HIGH (ref 11.4–15.2)

## 2016-09-16 DIAGNOSIS — I10 Essential (primary) hypertension: Secondary | ICD-10-CM | POA: Diagnosis not present

## 2016-09-16 DIAGNOSIS — G8191 Hemiplegia, unspecified affecting right dominant side: Secondary | ICD-10-CM | POA: Diagnosis not present

## 2016-09-16 DIAGNOSIS — Z993 Dependence on wheelchair: Secondary | ICD-10-CM | POA: Diagnosis not present

## 2016-09-16 DIAGNOSIS — I69998 Other sequelae following unspecified cerebrovascular disease: Secondary | ICD-10-CM | POA: Diagnosis not present

## 2016-09-16 DIAGNOSIS — R293 Abnormal posture: Secondary | ICD-10-CM | POA: Diagnosis not present

## 2016-09-17 DIAGNOSIS — Z993 Dependence on wheelchair: Secondary | ICD-10-CM | POA: Diagnosis not present

## 2016-09-17 DIAGNOSIS — G8191 Hemiplegia, unspecified affecting right dominant side: Secondary | ICD-10-CM | POA: Diagnosis not present

## 2016-09-17 DIAGNOSIS — R293 Abnormal posture: Secondary | ICD-10-CM | POA: Diagnosis not present

## 2016-09-17 DIAGNOSIS — I10 Essential (primary) hypertension: Secondary | ICD-10-CM | POA: Diagnosis not present

## 2016-09-17 DIAGNOSIS — I69998 Other sequelae following unspecified cerebrovascular disease: Secondary | ICD-10-CM | POA: Diagnosis not present

## 2016-09-20 DIAGNOSIS — I639 Cerebral infarction, unspecified: Secondary | ICD-10-CM | POA: Diagnosis not present

## 2016-09-20 DIAGNOSIS — I69328 Other speech and language deficits following cerebral infarction: Secondary | ICD-10-CM | POA: Diagnosis not present

## 2016-09-20 DIAGNOSIS — G8191 Hemiplegia, unspecified affecting right dominant side: Secondary | ICD-10-CM | POA: Diagnosis not present

## 2016-09-20 DIAGNOSIS — R293 Abnormal posture: Secondary | ICD-10-CM | POA: Diagnosis not present

## 2016-09-20 DIAGNOSIS — I69998 Other sequelae following unspecified cerebrovascular disease: Secondary | ICD-10-CM | POA: Diagnosis not present

## 2016-09-20 DIAGNOSIS — Z993 Dependence on wheelchair: Secondary | ICD-10-CM | POA: Diagnosis not present

## 2016-09-20 DIAGNOSIS — I10 Essential (primary) hypertension: Secondary | ICD-10-CM | POA: Diagnosis not present

## 2016-09-21 DIAGNOSIS — I10 Essential (primary) hypertension: Secondary | ICD-10-CM | POA: Diagnosis not present

## 2016-09-21 DIAGNOSIS — I69998 Other sequelae following unspecified cerebrovascular disease: Secondary | ICD-10-CM | POA: Diagnosis not present

## 2016-09-21 DIAGNOSIS — I69328 Other speech and language deficits following cerebral infarction: Secondary | ICD-10-CM | POA: Diagnosis not present

## 2016-09-21 DIAGNOSIS — R293 Abnormal posture: Secondary | ICD-10-CM | POA: Diagnosis not present

## 2016-09-21 DIAGNOSIS — G8191 Hemiplegia, unspecified affecting right dominant side: Secondary | ICD-10-CM | POA: Diagnosis not present

## 2016-09-21 DIAGNOSIS — I639 Cerebral infarction, unspecified: Secondary | ICD-10-CM | POA: Diagnosis not present

## 2016-09-22 ENCOUNTER — Encounter (HOSPITAL_COMMUNITY)
Admission: AD | Admit: 2016-09-22 | Discharge: 2016-09-22 | Disposition: A | Discharge status: Home / Self Care | Payer: Medicare Other | Source: Skilled Nursing Facility | Attending: Internal Medicine | Admitting: Internal Medicine

## 2016-09-22 DIAGNOSIS — G8191 Hemiplegia, unspecified affecting right dominant side: Secondary | ICD-10-CM | POA: Insufficient documentation

## 2016-09-22 DIAGNOSIS — I10 Essential (primary) hypertension: Secondary | ICD-10-CM | POA: Insufficient documentation

## 2016-09-22 DIAGNOSIS — I69328 Other speech and language deficits following cerebral infarction: Secondary | ICD-10-CM | POA: Diagnosis not present

## 2016-09-22 DIAGNOSIS — I69998 Other sequelae following unspecified cerebrovascular disease: Secondary | ICD-10-CM | POA: Insufficient documentation

## 2016-09-22 DIAGNOSIS — R293 Abnormal posture: Secondary | ICD-10-CM | POA: Diagnosis not present

## 2016-09-22 DIAGNOSIS — I639 Cerebral infarction, unspecified: Secondary | ICD-10-CM | POA: Diagnosis not present

## 2016-09-22 DIAGNOSIS — Z7901 Long term (current) use of anticoagulants: Secondary | ICD-10-CM | POA: Diagnosis not present

## 2016-09-22 LAB — PROTIME-INR
INR: 2.29
PROTHROMBIN TIME: 25.6 s — AB (ref 11.4–15.2)

## 2016-09-23 DIAGNOSIS — I69328 Other speech and language deficits following cerebral infarction: Secondary | ICD-10-CM | POA: Diagnosis not present

## 2016-09-23 DIAGNOSIS — R293 Abnormal posture: Secondary | ICD-10-CM | POA: Diagnosis not present

## 2016-09-23 DIAGNOSIS — I69998 Other sequelae following unspecified cerebrovascular disease: Secondary | ICD-10-CM | POA: Diagnosis not present

## 2016-09-23 DIAGNOSIS — G8191 Hemiplegia, unspecified affecting right dominant side: Secondary | ICD-10-CM | POA: Diagnosis not present

## 2016-09-23 DIAGNOSIS — I10 Essential (primary) hypertension: Secondary | ICD-10-CM | POA: Diagnosis not present

## 2016-09-23 DIAGNOSIS — I639 Cerebral infarction, unspecified: Secondary | ICD-10-CM | POA: Diagnosis not present

## 2016-09-24 DIAGNOSIS — I69328 Other speech and language deficits following cerebral infarction: Secondary | ICD-10-CM | POA: Diagnosis not present

## 2016-09-24 DIAGNOSIS — I10 Essential (primary) hypertension: Secondary | ICD-10-CM | POA: Diagnosis not present

## 2016-09-24 DIAGNOSIS — I639 Cerebral infarction, unspecified: Secondary | ICD-10-CM | POA: Diagnosis not present

## 2016-09-24 DIAGNOSIS — I69998 Other sequelae following unspecified cerebrovascular disease: Secondary | ICD-10-CM | POA: Diagnosis not present

## 2016-09-24 DIAGNOSIS — R293 Abnormal posture: Secondary | ICD-10-CM | POA: Diagnosis not present

## 2016-09-24 DIAGNOSIS — G8191 Hemiplegia, unspecified affecting right dominant side: Secondary | ICD-10-CM | POA: Diagnosis not present

## 2016-09-27 ENCOUNTER — Non-Acute Institutional Stay (SKILLED_NURSING_FACILITY): Payer: Medicare Other | Admitting: Internal Medicine

## 2016-09-27 ENCOUNTER — Encounter: Payer: Self-pay | Admitting: Internal Medicine

## 2016-09-27 DIAGNOSIS — G8191 Hemiplegia, unspecified affecting right dominant side: Secondary | ICD-10-CM | POA: Diagnosis not present

## 2016-09-27 DIAGNOSIS — I69998 Other sequelae following unspecified cerebrovascular disease: Secondary | ICD-10-CM | POA: Diagnosis not present

## 2016-09-27 DIAGNOSIS — I824Y9 Acute embolism and thrombosis of unspecified deep veins of unspecified proximal lower extremity: Secondary | ICD-10-CM

## 2016-09-27 DIAGNOSIS — I10 Essential (primary) hypertension: Secondary | ICD-10-CM

## 2016-09-27 DIAGNOSIS — I69328 Other speech and language deficits following cerebral infarction: Secondary | ICD-10-CM | POA: Diagnosis not present

## 2016-09-27 DIAGNOSIS — I639 Cerebral infarction, unspecified: Secondary | ICD-10-CM

## 2016-09-27 DIAGNOSIS — Z8709 Personal history of other diseases of the respiratory system: Secondary | ICD-10-CM

## 2016-09-27 DIAGNOSIS — Z8719 Personal history of other diseases of the digestive system: Secondary | ICD-10-CM

## 2016-09-27 DIAGNOSIS — R293 Abnormal posture: Secondary | ICD-10-CM | POA: Diagnosis not present

## 2016-09-27 NOTE — Progress Notes (Signed)
Location:   [facility] Room Number: 112/W Place of Service:  SNF [phone]) Provider:  Sabino Dick, MD  [name], [name]  Extended Emergency Contact Information Primary Emergency Contact: [name] Address: [address]          [place], [place] [address] [place] Home Phone: [phone] Work Phone: [phone] Relation: Brother  Code Status:  Full Code Goals of care: Advanced Directive information Advanced Directives 09/27/2016  Does Patient Have a Medical Advance Directive? Yes  Type of Advance Directive (No Data)  Does patient want to make changes to medical advance directive? No - Patient declined  Copy of Healthcare Power of Attorney in Chart? -     Chief Complaint  Patient presents with  . Medical Management of Chronic Issues    Routine Visit  For medical management of chronic medical issues including history of CVA-pulmonary embolism DVT-hypertension-COPD-constipation-  HPI:  Pt is a 71 y.o. male seen today for medical management of chronic diseases as noted above.  Patient has been a long-term resident of this facility continues to be quite stable he is on chronic Coumadin with a history of CVA as well as pulmonary embolism and DVT INR most recently therapeutic at 2.29 update INR is pending.  He does have a history of hypertension this has been stable as well on labetalol as well as lisinopril was recent blood pressure 106/47.  There is a COPD at times he does have an URI but has not had one in quite a while he is on duo nebs occasionally will need antibiotic for URI but this is not really an issue currently.  Is also had some mild weight loss which actually I feel is desired he's lost about 5 pounds in the last several weeks he had had fairly significant weight gain previously.  Does have a history of constipation he is on lactulose and Senokot and appears  to be tolerating this well.  Currently patient has no complaints he continues with right-sided weakness from CVA but is very motivated and ambulates about in his wheelchair using his left arm largely to propel himself.  .     Past Medical History:  Diagnosis Date  . Dysphasia   . Fatty liver   . Hypertension   . Pneumonia   . Pulmonary embolism (HCC)   . Stroke [facility])    Past Surgical History:  Procedure Laterality Date  . unable      Allergies  Allergen Reactions  . Penicillins     Current Outpatient Prescriptions on File Prior to Visit  Medication Sig Dispense Refill  . augmented betamethasone dipropionate (DIPROLENE AF) 0.05 % cream Apply only to legs and hands    . Eyelid Cleansers (SYSTANE LID WIPES EX) Start occusoft lid scrubs: clean eyelids of oil and debris BID OU ( each eye ) before administering eye drops.    Marland Kitchen ipratropium-albuterol (DUONEB) 0.5-2.5 (3) MG/3ML SOLN 1 unit dose vial twice a day VIA NEBULIZER (DX; CHRONIC HYPOXIA )    . labetalol (NORMODYNE) 200 MG tablet Take 200 mg by mouth daily. For HTN    . lactulose (CHRONULAC) 10 GM/15ML solution Give 15cc by mouth twice daily For constipation    . lisinopril (PRINIVIL,ZESTRIL) 10 MG tablet Take 20 mg by mouth daily. For HTN    . loratadine (CLARITIN) 10 MG tablet Take 10 mg by mouth daily.    Marland Kitchen  polyvinyl alcohol (LIQUIFILM TEARS) 1.4 % ophthalmic solution Place 1 drop into both eyes 2 (two) times daily.    . sennosides-docusate sodium (SENOKOT-S) 8.6-50 MG tablet Take 1 tablet by mouth at bedtime. For constipation    . White Petrolatum-Mineral Oil (SYSTANE NIGHTTIME) OINT Apply to eye. Apply to left eye at night     No current facility-administered medications on file prior to visit.      Review of Systems  General no complaints of fever or chills. Has gradually lost a small amount of weight which is desired  Skin does not complaining of any increased bruising bleeding or rashes.  Eyes does not  complain of visual changes from baseline.  Resp--does not complain of shortness breath or cough.--  Cardiac no chest pain has mild lower extremity edema which is baseline.  GI does not complain nausea vomiting diarrhea constipation or abdominal discomfort.  Muscle skeletal does not complain of joint pain .  Neurologic continues with right-sided weakness which is baseline upper and lower extremities with some contractures of his right hand-does not complain of headache dizziness or syncopal-type feelings.  Psych continues to be in good spirits pleasant does not complain of depression or anxiety    Immunization History  Administered Date(s) Administered  . Influenza-Unspecified 03/28/2014, 03/23/2016  . Pneumococcal-Unspecified 11/01/2009, 03/30/2016   Pertinent  Health Maintenance Due  Topic Date Due  . COLONOSCOPY  10/30/2025 (Originally 11/08/1995)  . INFLUENZA VACCINE  01/19/2017  . PNA vac Low Risk Adult (2 of 2 - PCV13) 03/30/2017   No flowsheet data found. Functional Status Survey:    Vitals:   09/27/16 1437  BP: (!) 106/47  Pulse: 86  Resp: (!) 22  Temp: 99.4 F (37.4 C)  TempSrc: Oral  SpO2: 92%  Weight is 194 pounds There is no height or weight on file to calculate BMI. Physical Exam In general this is a pleasant elderly male in no distress.Ambulating comfortably in his wheelchair  His skin is warm and dry.  I do not see any evidence of increased bruising or bleeding.  Oropharynx clear mucous membranes moist.  Eyes continues to have opacity of left eye with history of palsy right eye acuity appears grossly intact  Chest  Somewhat shallow air entry with some bronchial sounds there is no labored breathing this is relatively baseline exam  Heart is regular rate and rhythm without murmur gallop or rub has mild baseline lower extremity edema     Abdomen soft nontender protuberant positive bowel sounds  Muscle skeletal-continues with  right-sided weakness contracture of his right hand to some extentbut is able to move right upper and lower extremities-this is baseline---strength appears preserved left upper and lower extremities is ambulatory in wheelchair  Neurologic is grossly intact continues with right-sided weakness which is not new. With some dysarthric speech which is baseline    Labs reviewed:  Recent Labs  12/19/15 0735 03/23/16 0730 07/28/16 0700  NA 137 138 138  K 3.8 4.3 4.0  CL 103 106 102  CO2 GLUCOSE 75 77 87  BUN CREATININE 1.12 1.08 1.07  CALCIUM 8.3* 8.3* 8.5*    Recent Labs  07/28/16 0700  AST 23  ALT 29  ALKPHOS 59  BILITOT 0.4  PROT 7.0  ALBUMIN 3.3*    Recent Labs  12/19/15 0735 03/23/16 0730 07/28/16 0700  WBC 8.6 7.4 8.4  NEUTROABS 5.0  --  5.4  HGB 15.0 15.5 14.9  HCT 45.6  46.5 44.8  MCV 94.2 93.8 92.8  PLT 277 262 278   Lab Results  Component Value Date   TSH 1.285 12/19/2015   Lab Results  Component Value Date   HGBA1C  03/27/2009    6.0 (NOTE) The ADA recommends the following therapeutic goal for glycemic control related to Hgb A1c measurement: Goal of therapy: <6.5 Hgb A1c  Reference: American Diabetes Association: Clinical Practice Recommendations 2010, Diabetes Care, 2010, 33: (Suppl  1).   Lab Results  Component Value Date   CHOL 117 07/28/2016   HDL 33 (L) 07/28/2016   LDLCALC 75 07/28/2016   TRIG 45 07/28/2016   CHOLHDL 3.5 07/28/2016    Significant Diagnostic Results in last 30 days:  No results found.  Assessment/Plan  1 history CVA this appears stable continues with left-sided deficits which are baseline-he is on Coumadin for anticoagulation.  #2 history of PE and DVT again he continues on Coumadin which has been therapeutic as tolerated this well long term.  #3 hypertension this appears stable on labetalol and lisinopril at this point will monitor.  #5 history COPD continues with duo nebs-this is stable  currently lung exam actually appeared stable to slightly improved from previous exams.  #6 history of mild weight loss again this appears to be desired.  #7 history of constipation is on lactulose and Senokot this appears to be stable as well.  Number 8-- history of hyperlipidemia? He does have a history of fatty liver disease-lipid panel done in February 2018 showed an LDL of 75 which appears to be stable he is not on a statin:     [id number]

## 2016-09-28 DIAGNOSIS — I69328 Other speech and language deficits following cerebral infarction: Secondary | ICD-10-CM | POA: Diagnosis not present

## 2016-09-28 DIAGNOSIS — G8191 Hemiplegia, unspecified affecting right dominant side: Secondary | ICD-10-CM | POA: Diagnosis not present

## 2016-09-28 DIAGNOSIS — I639 Cerebral infarction, unspecified: Secondary | ICD-10-CM | POA: Diagnosis not present

## 2016-09-28 DIAGNOSIS — I69998 Other sequelae following unspecified cerebrovascular disease: Secondary | ICD-10-CM | POA: Diagnosis not present

## 2016-09-28 DIAGNOSIS — R293 Abnormal posture: Secondary | ICD-10-CM | POA: Diagnosis not present

## 2016-09-28 DIAGNOSIS — I10 Essential (primary) hypertension: Secondary | ICD-10-CM | POA: Diagnosis not present

## 2016-09-29 DIAGNOSIS — I639 Cerebral infarction, unspecified: Secondary | ICD-10-CM | POA: Diagnosis not present

## 2016-09-29 DIAGNOSIS — I69998 Other sequelae following unspecified cerebrovascular disease: Secondary | ICD-10-CM | POA: Diagnosis not present

## 2016-09-29 DIAGNOSIS — R293 Abnormal posture: Secondary | ICD-10-CM | POA: Diagnosis not present

## 2016-09-29 DIAGNOSIS — I69328 Other speech and language deficits following cerebral infarction: Secondary | ICD-10-CM | POA: Diagnosis not present

## 2016-09-29 DIAGNOSIS — I10 Essential (primary) hypertension: Secondary | ICD-10-CM | POA: Diagnosis not present

## 2016-09-29 DIAGNOSIS — G8191 Hemiplegia, unspecified affecting right dominant side: Secondary | ICD-10-CM | POA: Diagnosis not present

## 2016-09-30 DIAGNOSIS — I639 Cerebral infarction, unspecified: Secondary | ICD-10-CM | POA: Diagnosis not present

## 2016-09-30 DIAGNOSIS — I69328 Other speech and language deficits following cerebral infarction: Secondary | ICD-10-CM | POA: Diagnosis not present

## 2016-09-30 DIAGNOSIS — I69998 Other sequelae following unspecified cerebrovascular disease: Secondary | ICD-10-CM | POA: Diagnosis not present

## 2016-09-30 DIAGNOSIS — I10 Essential (primary) hypertension: Secondary | ICD-10-CM | POA: Diagnosis not present

## 2016-09-30 DIAGNOSIS — G8191 Hemiplegia, unspecified affecting right dominant side: Secondary | ICD-10-CM | POA: Diagnosis not present

## 2016-09-30 DIAGNOSIS — R293 Abnormal posture: Secondary | ICD-10-CM | POA: Diagnosis not present

## 2016-10-01 DIAGNOSIS — R293 Abnormal posture: Secondary | ICD-10-CM | POA: Diagnosis not present

## 2016-10-01 DIAGNOSIS — G8191 Hemiplegia, unspecified affecting right dominant side: Secondary | ICD-10-CM | POA: Diagnosis not present

## 2016-10-01 DIAGNOSIS — I639 Cerebral infarction, unspecified: Secondary | ICD-10-CM | POA: Diagnosis not present

## 2016-10-01 DIAGNOSIS — I69998 Other sequelae following unspecified cerebrovascular disease: Secondary | ICD-10-CM | POA: Diagnosis not present

## 2016-10-01 DIAGNOSIS — I69328 Other speech and language deficits following cerebral infarction: Secondary | ICD-10-CM | POA: Diagnosis not present

## 2016-10-01 DIAGNOSIS — I10 Essential (primary) hypertension: Secondary | ICD-10-CM | POA: Diagnosis not present

## 2016-10-04 DIAGNOSIS — I639 Cerebral infarction, unspecified: Secondary | ICD-10-CM | POA: Diagnosis not present

## 2016-10-04 DIAGNOSIS — R293 Abnormal posture: Secondary | ICD-10-CM | POA: Diagnosis not present

## 2016-10-04 DIAGNOSIS — I69998 Other sequelae following unspecified cerebrovascular disease: Secondary | ICD-10-CM | POA: Diagnosis not present

## 2016-10-04 DIAGNOSIS — I10 Essential (primary) hypertension: Secondary | ICD-10-CM | POA: Diagnosis not present

## 2016-10-04 DIAGNOSIS — G8191 Hemiplegia, unspecified affecting right dominant side: Secondary | ICD-10-CM | POA: Diagnosis not present

## 2016-10-04 DIAGNOSIS — I69328 Other speech and language deficits following cerebral infarction: Secondary | ICD-10-CM | POA: Diagnosis not present

## 2016-10-05 DIAGNOSIS — I69328 Other speech and language deficits following cerebral infarction: Secondary | ICD-10-CM | POA: Diagnosis not present

## 2016-10-05 DIAGNOSIS — I69998 Other sequelae following unspecified cerebrovascular disease: Secondary | ICD-10-CM | POA: Diagnosis not present

## 2016-10-05 DIAGNOSIS — I10 Essential (primary) hypertension: Secondary | ICD-10-CM | POA: Diagnosis not present

## 2016-10-05 DIAGNOSIS — G8191 Hemiplegia, unspecified affecting right dominant side: Secondary | ICD-10-CM | POA: Diagnosis not present

## 2016-10-05 DIAGNOSIS — R293 Abnormal posture: Secondary | ICD-10-CM | POA: Diagnosis not present

## 2016-10-05 DIAGNOSIS — I639 Cerebral infarction, unspecified: Secondary | ICD-10-CM | POA: Diagnosis not present

## 2016-10-06 ENCOUNTER — Encounter (HOSPITAL_COMMUNITY)
Admission: RE | Admit: 2016-10-06 | Discharge: 2016-10-06 | Disposition: A | Discharge status: Home / Self Care | Payer: Medicare Other | Source: Skilled Nursing Facility | Attending: Internal Medicine | Admitting: Internal Medicine

## 2016-10-06 DIAGNOSIS — Z7901 Long term (current) use of anticoagulants: Secondary | ICD-10-CM | POA: Insufficient documentation

## 2016-10-06 DIAGNOSIS — Z993 Dependence on wheelchair: Secondary | ICD-10-CM | POA: Diagnosis not present

## 2016-10-06 DIAGNOSIS — I69998 Other sequelae following unspecified cerebrovascular disease: Secondary | ICD-10-CM | POA: Insufficient documentation

## 2016-10-06 DIAGNOSIS — G8191 Hemiplegia, unspecified affecting right dominant side: Secondary | ICD-10-CM | POA: Diagnosis not present

## 2016-10-06 DIAGNOSIS — I10 Essential (primary) hypertension: Secondary | ICD-10-CM | POA: Insufficient documentation

## 2016-10-06 DIAGNOSIS — I69328 Other speech and language deficits following cerebral infarction: Secondary | ICD-10-CM | POA: Diagnosis not present

## 2016-10-06 DIAGNOSIS — R293 Abnormal posture: Secondary | ICD-10-CM | POA: Diagnosis not present

## 2016-10-06 DIAGNOSIS — I639 Cerebral infarction, unspecified: Secondary | ICD-10-CM | POA: Diagnosis not present

## 2016-10-06 LAB — PROTIME-INR
INR: 2.68
PROTHROMBIN TIME: 29.1 s — AB (ref 11.4–15.2)

## 2016-10-07 DIAGNOSIS — I10 Essential (primary) hypertension: Secondary | ICD-10-CM | POA: Diagnosis not present

## 2016-10-07 DIAGNOSIS — I639 Cerebral infarction, unspecified: Secondary | ICD-10-CM | POA: Diagnosis not present

## 2016-10-07 DIAGNOSIS — R293 Abnormal posture: Secondary | ICD-10-CM | POA: Diagnosis not present

## 2016-10-07 DIAGNOSIS — I69998 Other sequelae following unspecified cerebrovascular disease: Secondary | ICD-10-CM | POA: Diagnosis not present

## 2016-10-07 DIAGNOSIS — G8191 Hemiplegia, unspecified affecting right dominant side: Secondary | ICD-10-CM | POA: Diagnosis not present

## 2016-10-07 DIAGNOSIS — I69328 Other speech and language deficits following cerebral infarction: Secondary | ICD-10-CM | POA: Diagnosis not present

## 2016-10-11 DIAGNOSIS — G8191 Hemiplegia, unspecified affecting right dominant side: Secondary | ICD-10-CM | POA: Diagnosis not present

## 2016-10-11 DIAGNOSIS — I639 Cerebral infarction, unspecified: Secondary | ICD-10-CM | POA: Diagnosis not present

## 2016-10-11 DIAGNOSIS — I10 Essential (primary) hypertension: Secondary | ICD-10-CM | POA: Diagnosis not present

## 2016-10-11 DIAGNOSIS — I69998 Other sequelae following unspecified cerebrovascular disease: Secondary | ICD-10-CM | POA: Diagnosis not present

## 2016-10-11 DIAGNOSIS — R293 Abnormal posture: Secondary | ICD-10-CM | POA: Diagnosis not present

## 2016-10-11 DIAGNOSIS — I69328 Other speech and language deficits following cerebral infarction: Secondary | ICD-10-CM | POA: Diagnosis not present

## 2016-10-12 DIAGNOSIS — I10 Essential (primary) hypertension: Secondary | ICD-10-CM | POA: Diagnosis not present

## 2016-10-12 DIAGNOSIS — G8191 Hemiplegia, unspecified affecting right dominant side: Secondary | ICD-10-CM | POA: Diagnosis not present

## 2016-10-12 DIAGNOSIS — I69998 Other sequelae following unspecified cerebrovascular disease: Secondary | ICD-10-CM | POA: Diagnosis not present

## 2016-10-12 DIAGNOSIS — I69328 Other speech and language deficits following cerebral infarction: Secondary | ICD-10-CM | POA: Diagnosis not present

## 2016-10-12 DIAGNOSIS — I639 Cerebral infarction, unspecified: Secondary | ICD-10-CM | POA: Diagnosis not present

## 2016-10-12 DIAGNOSIS — R293 Abnormal posture: Secondary | ICD-10-CM | POA: Diagnosis not present

## 2016-10-13 ENCOUNTER — Encounter (HOSPITAL_COMMUNITY)
Admission: RE | Admit: 2016-10-13 | Discharge: 2016-10-13 | Disposition: A | Discharge status: Home / Self Care | Payer: Medicare Other | Source: Skilled Nursing Facility | Attending: *Deleted | Admitting: *Deleted

## 2016-10-13 DIAGNOSIS — I10 Essential (primary) hypertension: Secondary | ICD-10-CM | POA: Diagnosis not present

## 2016-10-13 DIAGNOSIS — I639 Cerebral infarction, unspecified: Secondary | ICD-10-CM | POA: Diagnosis not present

## 2016-10-13 DIAGNOSIS — I69998 Other sequelae following unspecified cerebrovascular disease: Secondary | ICD-10-CM | POA: Diagnosis not present

## 2016-10-13 DIAGNOSIS — Z7901 Long term (current) use of anticoagulants: Secondary | ICD-10-CM | POA: Diagnosis not present

## 2016-10-13 DIAGNOSIS — R293 Abnormal posture: Secondary | ICD-10-CM | POA: Diagnosis not present

## 2016-10-13 DIAGNOSIS — I69328 Other speech and language deficits following cerebral infarction: Secondary | ICD-10-CM | POA: Diagnosis not present

## 2016-10-13 DIAGNOSIS — G8191 Hemiplegia, unspecified affecting right dominant side: Secondary | ICD-10-CM | POA: Diagnosis not present

## 2016-10-13 LAB — PROTIME-INR
INR: 3.02
Prothrombin Time: 32 seconds — ABNORMAL HIGH (ref 11.4–15.2)

## 2016-10-14 DIAGNOSIS — R293 Abnormal posture: Secondary | ICD-10-CM | POA: Diagnosis not present

## 2016-10-14 DIAGNOSIS — I69998 Other sequelae following unspecified cerebrovascular disease: Secondary | ICD-10-CM | POA: Diagnosis not present

## 2016-10-14 DIAGNOSIS — I10 Essential (primary) hypertension: Secondary | ICD-10-CM | POA: Diagnosis not present

## 2016-10-14 DIAGNOSIS — I639 Cerebral infarction, unspecified: Secondary | ICD-10-CM | POA: Diagnosis not present

## 2016-10-14 DIAGNOSIS — I69328 Other speech and language deficits following cerebral infarction: Secondary | ICD-10-CM | POA: Diagnosis not present

## 2016-10-14 DIAGNOSIS — G8191 Hemiplegia, unspecified affecting right dominant side: Secondary | ICD-10-CM | POA: Diagnosis not present

## 2016-10-15 ENCOUNTER — Encounter (HOSPITAL_COMMUNITY)
Admission: RE | Admit: 2016-10-15 | Discharge: 2016-10-15 | Disposition: A | Discharge status: Home / Self Care | Payer: Medicare Other | Source: Skilled Nursing Facility | Attending: *Deleted | Admitting: *Deleted

## 2016-10-15 DIAGNOSIS — I69998 Other sequelae following unspecified cerebrovascular disease: Secondary | ICD-10-CM | POA: Diagnosis not present

## 2016-10-15 DIAGNOSIS — I69328 Other speech and language deficits following cerebral infarction: Secondary | ICD-10-CM | POA: Diagnosis not present

## 2016-10-15 DIAGNOSIS — G8191 Hemiplegia, unspecified affecting right dominant side: Secondary | ICD-10-CM | POA: Diagnosis not present

## 2016-10-15 DIAGNOSIS — I639 Cerebral infarction, unspecified: Secondary | ICD-10-CM | POA: Diagnosis not present

## 2016-10-15 DIAGNOSIS — Z7901 Long term (current) use of anticoagulants: Secondary | ICD-10-CM | POA: Diagnosis not present

## 2016-10-15 DIAGNOSIS — R293 Abnormal posture: Secondary | ICD-10-CM | POA: Diagnosis not present

## 2016-10-15 DIAGNOSIS — I10 Essential (primary) hypertension: Secondary | ICD-10-CM | POA: Diagnosis not present

## 2016-10-15 LAB — PROTIME-INR
INR: 2.81
PROTHROMBIN TIME: 30.2 s — AB (ref 11.4–15.2)

## 2016-10-16 DIAGNOSIS — R0902 Hypoxemia: Secondary | ICD-10-CM | POA: Diagnosis not present

## 2016-10-16 DIAGNOSIS — R0602 Shortness of breath: Secondary | ICD-10-CM | POA: Diagnosis not present

## 2016-10-18 ENCOUNTER — Encounter: Payer: Self-pay | Admitting: Internal Medicine

## 2016-10-18 ENCOUNTER — Encounter (HOSPITAL_COMMUNITY)
Admission: RE | Admit: 2016-10-18 | Discharge: 2016-10-18 | Disposition: A | Discharge status: Home / Self Care | Payer: Medicare Other | Source: Skilled Nursing Facility | Attending: *Deleted | Admitting: *Deleted

## 2016-10-18 ENCOUNTER — Non-Acute Institutional Stay (SKILLED_NURSING_FACILITY): Payer: Medicare Other | Admitting: Internal Medicine

## 2016-10-18 DIAGNOSIS — J208 Acute bronchitis due to other specified organisms: Secondary | ICD-10-CM

## 2016-10-18 DIAGNOSIS — R938 Abnormal findings on diagnostic imaging of other specified body structures: Secondary | ICD-10-CM

## 2016-10-18 DIAGNOSIS — Z7901 Long term (current) use of anticoagulants: Secondary | ICD-10-CM

## 2016-10-18 DIAGNOSIS — R9389 Abnormal findings on diagnostic imaging of other specified body structures: Secondary | ICD-10-CM

## 2016-10-18 LAB — PROTIME-INR
INR: 2.58
PROTHROMBIN TIME: 28.2 s — AB (ref 11.4–15.2)

## 2016-10-18 NOTE — Progress Notes (Signed)
Location:   [facility] Room Number: 112/W Place of Service:  SNF [phone]) Provider:  Edmon Crape  Mahlon Gammon, MD  [name], [name]  Extended Emergency Contact Information Primary Emergency Contact: [name] Address: [address]          [place], [place] [address] [place] of [place] Home Phone: [phone] Work Phone: [phone] Relation: Brother  Code Status:  Full Code Goals of care: Advanced Directive information Advanced Directives 10/18/2016  Does Patient Have a Medical Advance Directive? Yes  Type of Advance Directive (No Data)  Does patient want to make changes to medical advance directive? No - Patient declined  Copy of Healthcare Power of Attorney in Chart? -    Chief complaint-acute visit secondary to suspected pneumonia HPI:  Pt is a 71 y.o. male seen today for an acute visit for follow-up of increased cough and congestion-x-ray was done over the weekend which showed reticular nodular opacities in the right lower lobe that suggested developing infiltrate.  He does have a history of COPD is on nebulizers routinely twice day as well as Robitussin when necessary-is also on Claritin with a history of allergic rhinitis.  Currently his vital signs are stable continues to ambulate about facility in his wheelchair does not appear to have increased shortness of breath from baseline he is afebrile O2 sats ration is in the low 90s on room air  He has been started on azithromycin he responded well to this last time.  He does not really have any complaints today   He is on Coumadin with a history of CVA as well as DVT pulmonary embolism in the past-INR is therapeutic today at 2.58 he is currently on 2 mg of Coumadin a day   Past Medical History:  Diagnosis Date  . Dysphasia   . Fatty liver   . Hypertension   . Pneumonia   . Pulmonary embolism (HCC)   . Stroke [facility])    Past  Surgical History:  Procedure Laterality Date  . unable      Allergies  Allergen Reactions  . Penicillins     Outpatient Encounter Prescriptions as of 10/18/2016  Medication Sig  . acetaminophen (TYLENOL) 325 MG tablet Take 650 mg by mouth every 6 (six) hours as needed.  Marland Kitchen azithromycin (ZITHROMAX) 250 MG tablet Take 250 mg by mouth daily.  . betamethasone dipropionate (DIPROLENE) 0.05 % cream Apply to legs and hands NOT TO FACE, GROIN,AXILLA TRY CERAVAE FIRST twice  day  . Dextromethorphan-Guaifenesin (ROBITUSSIN DM PO) Give 15 ml by mouth for cough every 6 hours as needed prn  . Eyelid Cleansers (SYSTANE LID WIPES EX) Start occusoft lid scrubs: clean eyelids of oil and debris BID OU ( each eye ) before administering eye drops.  Marland Kitchen ipratropium-albuterol (DUONEB) 0.5-2.5 (3) MG/3ML SOLN 1 unit dose vial twice a day VIA NEBULIZER (DX; CHRONIC HYPOXIA )  . labetalol (NORMODYNE) 200 MG tablet Take 200 mg by mouth daily. For HTN  . lactulose (CHRONULAC) 10 GM/15ML solution Give 15cc by mouth twice daily For constipation  . lisinopril (PRINIVIL,ZESTRIL) 10 MG tablet Take 20 mg by mouth daily. For HTN  . loratadine (CLARITIN) 10 MG tablet Take 10 mg by mouth daily.  . polyvinyl alcohol (LIQUIFILM TEARS) 1.4 % ophthalmic solution Place 1 drop into both eyes 2 (two) times daily.  . sennosides-docusate sodium (SENOKOT-S) 8.6-50 MG tablet Take 1 tablet by mouth at bedtime.  For constipation  . warfarin (COUMADIN) 2 MG tablet Give 2 mg by mouth once a day  . White Petrolatum-Mineral Oil (SYSTANE NIGHTTIME) OINT Apply to eye. Apply to left eye at night  . [DISCONTINUED] augmented betamethasone dipropionate (DIPROLENE AF) 0.05 % cream Apply only to legs and hands  . [DISCONTINUED] warfarin (COUMADIN) 2.5 MG tablet Give 2.5 mg by mouth once a day on Mon, Wed, Fri   No facility-administered encounter medications on file as of 10/18/2016.     Review of Systems   General no complaints of fever or chills.     Skin does not complaining of any increased bruising bleeding or rashes.  Eyes does not complain of visual changes from baseline.  Resp--does not complain of shortness breath or greatly increased cough-again chest x-ray was concerning for pneumonia however--  Cardiac no chest pain has mild lower extremity edema which is baseline.  GI does not complain nausea vomiting diarrhea constipation or abdominal discomfort.  Muscle skeletal does not complain of joint pain .  Neurologic continues with right-sided weakness which is baseline upper and lower extremities with some contractures of his right hand-does not complain of headache dizziness or syncopal-type feelings.  Psych continues to be in good spirits pleasant does not complain of depression or anxiety   Immunization History  Administered Date(s) Administered  . Influenza-Unspecified 03/28/2014, 03/23/2016  . Pneumococcal-Unspecified 11/01/2009, 03/30/2016   Pertinent  Health Maintenance Due  Topic Date Due  . COLONOSCOPY  10/30/2025 (Originally 11/08/1995)  . INFLUENZA VACCINE  01/19/2017  . PNA vac Low Risk Adult (2 of 2 - PCV13) 03/30/2017   No flowsheet data found. Functional Status Survey:    Vitals:   10/18/16 1140  BP: (!) 107/55  Pulse: 87  Resp: 18  Temp: 97.5 F (36.4 C)  TempSrc: Oral  SpO2: 91%    Physical Exam   In general this is a pleasant elderly male in no distress.Ambulating comfortably in his wheelchair  His skin is warm and dry.  I do not see any evidence of increased bruising or bleeding.  Oropharynx clear mucous membranes moist.  Eyes continues to have opacity of left eye with history of palsy right eye acuity appears grossly intact  Chest  Somewhat shallow air entry with increased rhonchi and chest congestion  Diffuse  -there is no labored breathing or sign of distress   Heart is regular rate and rhythm without murmur gallop or rub has mild baseline lower extremity  edema     Abdomen soft nontender protuberant positive bowel sounds  Muscle skeletal-continues with right-sided weakness contracture of his right hand to some extentbut is able to move right upper and lower extremities-this is baseline---strength appears preserved left upper and lower extremities is ambulatory inwheelchair  Neurologic is grossly intact continues with right-sided weakness which is not new. With some dysarthric speech which is baseline  Psych he is alert and oriented pleasant and appropriate  Labs reviewed:  Recent Labs  12/19/15 0735 03/23/16 0730 07/28/16 0700  NA 137 138 138  K 3.8 4.3 4.0  CL 103 106 102  CO2 GLUCOSE 75 77 87  BUN CREATININE 1.12 1.08 1.07  CALCIUM 8.3* 8.3* 8.5*    Recent Labs  07/28/16 0700  AST 23  ALT 29  ALKPHOS 59  BILITOT 0.4  PROT 7.0  ALBUMIN 3.3*    Recent Labs  12/19/15 0735 03/23/16 0730 07/28/16 0700  WBC 8.6 7.4 8.4  NEUTROABS 5.0  --  5.4  HGB 15.0 15.5 14.9  HCT 45.6 46.5 44.8  MCV 94.2 93.8 92.8  PLT 277 262 278   Lab Results  Component Value Date   TSH 1.285 12/19/2015   Lab Results  Component Value Date   HGBA1C  03/27/2009    6.0 (NOTE) The ADA recommends the following therapeutic goal for glycemic control related to Hgb A1c measurement: Goal of therapy: <6.5 Hgb A1c  Reference: American Diabetes Association: Clinical Practice Recommendations 2010, Diabetes Care, 2010, 33: (Suppl  1).   Lab Results  Component Value Date   CHOL 117 07/28/2016   HDL 33 (L) 07/28/2016   LDLCALC 75 07/28/2016   TRIG 45 07/28/2016   CHOLHDL 3.5 07/28/2016    Significant Diagnostic Results in last 30 days:  No results found.  Assessment/Plan  Infiltrate suspicious for pneumonia on chest x-ray-he has been started on azithromycin will be on this through May 2-also has duo nebs routinely twice a day Will increased this to every 6 hours routinely for 72 hours then back to twice a  day routine and every 6 hours when necessary.  Continues on Robitussin   every 6 hours when necessary this will have to be encouraged.  #2 in regards anticoagulation management-INR is therapeutic but this will update but since he is on an antibiotic will update an INR on Thursday, May 3 INR is therapeutic.  [id number]      London Sheer, CMA [phone]

## 2016-10-19 ENCOUNTER — Non-Acute Institutional Stay (SKILLED_NURSING_FACILITY): Payer: Medicare Other | Admitting: Internal Medicine

## 2016-10-19 ENCOUNTER — Encounter (HOSPITAL_COMMUNITY)
Admission: RE | Admit: 2016-10-19 | Discharge: 2016-10-19 | Disposition: A | Discharge status: Home / Self Care | Payer: Medicare Other | Source: Skilled Nursing Facility | Attending: Internal Medicine | Admitting: Internal Medicine

## 2016-10-19 ENCOUNTER — Encounter: Payer: Self-pay | Admitting: Internal Medicine

## 2016-10-19 DIAGNOSIS — I69328 Other speech and language deficits following cerebral infarction: Secondary | ICD-10-CM | POA: Diagnosis not present

## 2016-10-19 DIAGNOSIS — I639 Cerebral infarction, unspecified: Secondary | ICD-10-CM | POA: Diagnosis not present

## 2016-10-19 DIAGNOSIS — J441 Chronic obstructive pulmonary disease with (acute) exacerbation: Secondary | ICD-10-CM

## 2016-10-19 DIAGNOSIS — I638 Other cerebral infarction: Secondary | ICD-10-CM

## 2016-10-19 DIAGNOSIS — I6389 Other cerebral infarction: Secondary | ICD-10-CM

## 2016-10-19 DIAGNOSIS — Z993 Dependence on wheelchair: Secondary | ICD-10-CM | POA: Diagnosis not present

## 2016-10-19 DIAGNOSIS — R0902 Hypoxemia: Secondary | ICD-10-CM | POA: Diagnosis not present

## 2016-10-19 DIAGNOSIS — I10 Essential (primary) hypertension: Secondary | ICD-10-CM | POA: Insufficient documentation

## 2016-10-19 DIAGNOSIS — J449 Chronic obstructive pulmonary disease, unspecified: Secondary | ICD-10-CM | POA: Insufficient documentation

## 2016-10-19 DIAGNOSIS — I2782 Chronic pulmonary embolism: Secondary | ICD-10-CM

## 2016-10-19 DIAGNOSIS — D500 Iron deficiency anemia secondary to blood loss (chronic): Secondary | ICD-10-CM | POA: Insufficient documentation

## 2016-10-19 DIAGNOSIS — I69998 Other sequelae following unspecified cerebrovascular disease: Secondary | ICD-10-CM | POA: Diagnosis not present

## 2016-10-19 DIAGNOSIS — Z7901 Long term (current) use of anticoagulants: Secondary | ICD-10-CM | POA: Insufficient documentation

## 2016-10-19 DIAGNOSIS — G8191 Hemiplegia, unspecified affecting right dominant side: Secondary | ICD-10-CM | POA: Diagnosis not present

## 2016-10-19 DIAGNOSIS — R293 Abnormal posture: Secondary | ICD-10-CM | POA: Diagnosis not present

## 2016-10-19 DIAGNOSIS — R0602 Shortness of breath: Secondary | ICD-10-CM | POA: Diagnosis not present

## 2016-10-19 LAB — CBC WITH DIFFERENTIAL/PLATELET
BASOS ABS: 0 10*3/uL (ref 0.0–0.1)
Basophils Relative: 0 %
EOS ABS: 0 10*3/uL (ref 0.0–0.7)
EOS PCT: 0 %
HCT: 50.2 % (ref 39.0–52.0)
HEMOGLOBIN: 16.4 g/dL (ref 13.0–17.0)
LYMPHS PCT: 12 %
Lymphs Abs: 1.4 10*3/uL (ref 0.7–4.0)
MCH: 30.1 pg (ref 26.0–34.0)
MCHC: 32.7 g/dL (ref 30.0–36.0)
MCV: 92.1 fL (ref 78.0–100.0)
Monocytes Absolute: 0.4 10*3/uL (ref 0.1–1.0)
Monocytes Relative: 4 %
NEUTROS PCT: 85 %
Neutro Abs: 9.7 10*3/uL — ABNORMAL HIGH (ref 1.7–7.7)
PLATELETS: 349 10*3/uL (ref 150–400)
RBC: 5.45 MIL/uL (ref 4.22–5.81)
RDW: 13.4 % (ref 11.5–15.5)
WBC: 11.5 10*3/uL — AB (ref 4.0–10.5)

## 2016-10-19 LAB — COMPREHENSIVE METABOLIC PANEL
ALBUMIN: 4 g/dL (ref 3.5–5.0)
ALK PHOS: 74 U/L (ref 38–126)
ALT: 39 U/L (ref 17–63)
AST: 29 U/L (ref 15–41)
Anion gap: 12 (ref 5–15)
BUN: 29 mg/dL — ABNORMAL HIGH (ref 6–20)
CALCIUM: 9.6 mg/dL (ref 8.9–10.3)
CO2: 25 mmol/L (ref 22–32)
Chloride: 103 mmol/L (ref 101–111)
Creatinine, Ser: 1.13 mg/dL (ref 0.61–1.24)
GFR calc non Af Amer: >60 mL/min (ref >60)
Glucose, Bld: 172 mg/dL — ABNORMAL HIGH (ref 65–99)
Potassium: 4.3 mmol/L (ref 3.5–5.1)
SODIUM: 140 mmol/L (ref 135–145)
TOTAL PROTEIN: 9 g/dL — AB (ref 6.5–8.1)
Total Bilirubin: 0.7 mg/dL (ref 0.3–1.2)

## 2016-10-19 LAB — PROTIME-INR
INR: 2.43
Prothrombin Time: 26.8 seconds — ABNORMAL HIGH (ref 11.4–15.2)

## 2016-10-19 NOTE — Progress Notes (Signed)
Location:   [facility] Room Number: 112/W Place of Service:  SNF [phone]) Provider:  Pollyann Savoy, MD  [name], [name]  Extended Emergency Contact Information Primary Emergency Contact: [name] Address: [address]          [place], [place] [address] [place] of [place] Home Phone: [phone] Work Phone: [phone] Relation: Brother  Code Status:  Full Code Goals of care: Advanced Directive information Advanced Directives 10/19/2016  Does Patient Have a Medical Advance Directive? Yes  Type of Advance Directive (No Data)  Does patient want to make changes to medical advance directive? No - Patient declined  Copy of Healthcare Power of Attorney in Chart? -     Chief Complaint  Patient presents with  . Acute Visit    Increased drooling and right side of mouth drooping with Coughing     HPI:  Pt is a 71 y.o. male seen today for an acute visit for increased Coughing.  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 Hypertension and COPD.On reviewing his Chart he has been on Chronic Coumadin since 2014 and has been continued due to his CVA .  Patient is usually stable in facility but over the weekend they noticed increasing cough and Chest Xray was ordered. It showed ? Infiltrate in Right Lower lobe. He was seen by Arlo yesterday and started on Z pack. But Nurse noticed this morning that he is little Hypoxic with POX of 88% on RA. Coughing more and sounds more congested. patient is Afebrile. Has Productive cough. Denies any SOB. No chest pain. He was not complaining of anything else.   Past Medical History:  Diagnosis Date  . Dysphasia   . Fatty liver   . Hypertension   . Pneumonia   . Pulmonary embolism (HCC)   . Stroke [facility])    Past Surgical History:  Procedure Laterality Date  . unable       Allergies  Allergen Reactions  . Penicillins     Outpatient Encounter Prescriptions as of 10/19/2016  Medication Sig  . acetaminophen (TYLENOL) 325 MG tablet Take 650 mg by mouth every 6 (six) hours as needed.  Marland Kitchen azithromycin (ZITHROMAX) 250 MG tablet Take 250 mg by mouth daily.  . betamethasone dipropionate (DIPROLENE) 0.05 % cream Apply to legs and hands NOT TO FACE, GROIN,AXILLA TRY CERAVAE FIRST twice  day  . Dextromethorphan-Guaifenesin (ROBITUSSIN DM PO) Give 15 ml by mouth for cough every 6 hours as needed prn  . Eyelid Cleansers (SYSTANE LID WIPES EX) Start occusoft lid scrubs: clean eyelids of oil and debris BID OU ( each eye ) before administering eye drops.  Marland Kitchen ipratropium-albuterol (DUONEB) 0.5-2.5 (3) MG/3ML SOLN Give 1 inhalation every 6 hours from 10/18/2016-10/20/2016 then give twice a day  . labetalol (NORMODYNE) 200 MG tablet Take 200 mg by mouth daily. For HTN  . lactulose (CHRONULAC) 10 GM/15ML solution Give 15cc by mouth twice daily For constipation  . lisinopril (PRINIVIL,ZESTRIL) 10 MG tablet Take 20 mg by mouth daily. For HTN  . loratadine (CLARITIN) 10 MG tablet Take 10 mg by mouth daily.  . polyvinyl alcohol (LIQUIFILM TEARS) 1.4 % ophthalmic solution Place 1 drop into both eyes 2 (two) times daily.  . sennosides-docusate sodium (SENOKOT-S) 8.6-50 MG tablet Take 1 tablet by mouth at bedtime. For constipation  . warfarin (  COUMADIN) 2 MG tablet Give 2 mg by mouth once a day  . White Petrolatum-Mineral Oil (SYSTANE NIGHTTIME) OINT Apply to eye. Apply to left eye at night  . [DISCONTINUED] ipratropium-albuterol (DUONEB) 0.5-2.5 (3) MG/3ML SOLN 1 unit dose vial twice a day VIA NEBULIZER (DX; CHRONIC HYPOXIA )   No facility-administered encounter medications on file as of 10/19/2016.      Review of Systems  Review of Systems  Constitutional: Negative for activity change, appetite change, chills, diaphoresis, fatigue and fever.  HENT: Negative for mouth sores, postnasal  drip, rhinorrhea, sinus pain and sore throat.   Respiratory: Negative for apnea, , chest tightness, shortness of breath and wheezing.   Cardiovascular: Negative for chest pain, palpitations and leg swelling.  Gastrointestinal: Negative for abdominal distention, abdominal pain, constipation, diarrhea, nausea and vomiting.  Genitourinary: Negative for dysuria and frequency.  Musculoskeletal: Negative for arthralgias, joint swelling and myalgias.  Skin: Negative for rash.  Neurological: Negative for dizziness, syncope, weakness, light-headedness and numbness.  Psychiatric/Behavioral: Negative for behavioral problems, confusion and sleep disturbance.     Immunization History  Administered Date(s) Administered  . Influenza-Unspecified 03/28/2014, 03/23/2016  . Pneumococcal-Unspecified 11/01/2009, 03/30/2016   Pertinent  Health Maintenance Due  Topic Date Due  . COLONOSCOPY  10/30/2025 (Originally 11/08/1995)  . INFLUENZA VACCINE  01/19/2017  . PNA vac Low Risk Adult (2 of 2 - PCV13) 03/30/2017   No flowsheet data found. Functional Status Survey:    Vitals:   10/19/16 1031  BP: (!) 140/102 148/99  Pulse: (!) 113/118  Resp: (!) 24  Temp: 97.5 F (36.4 C)  TempSrc: Axillary  SpO2: (!) 88%/95%   There is no height or weight on file to calculate BMI. Physical Exam  Constitutional: He appears well-developed and well-nourished.  HENT:  Head: Normocephalic.  Mouth/Throat: Oropharynx is clear and moist.  Eyes: Pupils are equal, round, and reactive to light.  Neck: Neck supple.  Cardiovascular: Regular rhythm.  Tachycardia present.   No murmur heard. Pulmonary/Chest: Effort normal.  Patient has Rales in Both Lower lung bases.  Abdominal: Soft. Bowel sounds are normal. He exhibits no distension. There is no tenderness. There is no rebound.  Musculoskeletal: He exhibits edema.  Neurological: He is alert.  Patient continues to have Right Upper and Lower extremity deficit. Left UE  and LE was normal strength Did not see any new neuro Deficit.  Skin: Skin is warm and dry.  Psychiatric: He has a normal mood and affect. His behavior is normal. Thought content normal.    Labs reviewed:  Recent Labs  12/19/15 0735 03/23/16 0730 07/28/16 0700  NA 137 138 138  K 3.8 4.3 4.0  CL 103 106 102  CO2 GLUCOSE 75 77 87  BUN CREATININE 1.12 1.08 1.07  CALCIUM 8.3* 8.3* 8.5*    Recent Labs  07/28/16 0700  AST 23  ALT 29  ALKPHOS 59  BILITOT 0.4  PROT 7.0  ALBUMIN 3.3*    Recent Labs  12/19/15 0735 03/23/16 0730 07/28/16 0700  WBC 8.6 7.4 8.4  NEUTROABS 5.0  --  5.4  HGB 15.0 15.5 14.9  HCT 45.6 46.5 44.8  MCV 94.2 93.8 92.8  PLT 277 262 278   Lab Results  Component Value Date   TSH 1.285 12/19/2015   Lab Results  Component Value Date   HGBA1C  03/27/2009    6.0 (NOTE) The ADA recommends the following therapeutic goal for glycemic control related to Hgb  A1c measurement: Goal of therapy: <6.5 Hgb A1c  Reference: American Diabetes Association: Clinical Practice Recommendations 2010, Diabetes Care, 2010, 33: (Suppl  1).   Lab Results  Component Value Date   CHOL 117 07/28/2016   HDL 33 (L) 07/28/2016   LDLCALC 75 07/28/2016   TRIG 45 07/28/2016   CHOLHDL 3.5 07/28/2016    Significant Diagnostic Results in last 30 days:  No results found.  Assessment/Plan  COPD exacerbation Stat Labs were done including Chest Xray Chest xray did not show any Infiltrate. CBC showed increased White Count with left shift.  Will start him On Levaquin to give Broader Coverage.  Start on prednisone 40 mg for 5 days Continue Oxygen  Follow Vitals and POX. Reevaluate him tomorrow. Chronic PE On Coumadin and is therapeutic.     Family/ staff Communication:   Labs/tests ordered:

## 2016-10-20 ENCOUNTER — Non-Acute Institutional Stay (SKILLED_NURSING_FACILITY): Payer: Medicare Other | Admitting: Internal Medicine

## 2016-10-20 ENCOUNTER — Encounter: Payer: Self-pay | Admitting: Internal Medicine

## 2016-10-20 ENCOUNTER — Encounter (HOSPITAL_COMMUNITY)
Admission: AD | Admit: 2016-10-20 | Discharge: 2016-10-20 | Disposition: A | Discharge status: Home / Self Care | Payer: Medicare Other | Source: Skilled Nursing Facility | Attending: *Deleted | Admitting: *Deleted

## 2016-10-20 DIAGNOSIS — I69328 Other speech and language deficits following cerebral infarction: Secondary | ICD-10-CM | POA: Diagnosis not present

## 2016-10-20 DIAGNOSIS — D72829 Elevated white blood cell count, unspecified: Secondary | ICD-10-CM | POA: Diagnosis not present

## 2016-10-20 DIAGNOSIS — J441 Chronic obstructive pulmonary disease with (acute) exacerbation: Secondary | ICD-10-CM | POA: Diagnosis not present

## 2016-10-20 DIAGNOSIS — R000 Tachycardia, unspecified: Secondary | ICD-10-CM

## 2016-10-20 DIAGNOSIS — I69998 Other sequelae following unspecified cerebrovascular disease: Secondary | ICD-10-CM | POA: Diagnosis not present

## 2016-10-20 DIAGNOSIS — R293 Abnormal posture: Secondary | ICD-10-CM | POA: Diagnosis not present

## 2016-10-20 DIAGNOSIS — G8191 Hemiplegia, unspecified affecting right dominant side: Secondary | ICD-10-CM | POA: Diagnosis not present

## 2016-10-20 DIAGNOSIS — I639 Cerebral infarction, unspecified: Secondary | ICD-10-CM | POA: Diagnosis not present

## 2016-10-20 DIAGNOSIS — I10 Essential (primary) hypertension: Secondary | ICD-10-CM | POA: Diagnosis not present

## 2016-10-20 LAB — URINALYSIS, ROUTINE W REFLEX MICROSCOPIC
BILIRUBIN URINE: NEGATIVE
GLUCOSE, UA: NEGATIVE mg/dL
Leukocytes, UA: NEGATIVE
NITRITE: POSITIVE — AB
PH: 5.5 (ref 5.0–8.0)
Protein, ur: 100 mg/dL — AB
Specific Gravity, Urine: 1.025 (ref 1.005–1.030)

## 2016-10-20 LAB — URINALYSIS, MICROSCOPIC (REFLEX)

## 2016-10-20 NOTE — Progress Notes (Signed)
Location:   [facility] Room Number: 112/W Place of Service:  SNF [phone]) Provider:  Edmon Crape  Mahlon Gammon, MD  [name], [name]  Extended Emergency Contact Information Primary Emergency Contact: [name] Address: [address]          [place], [place] [address] D[place] of [place] Home Phone: [phone] Work Phone: [phone] Relation: Brother  Code Status:  Full Code Goals of care: Advanced Directive information Advanced Directives 10/20/2016  Does Patient Have a Medical Advance Directive? Yes  Type of Advance Directive (No Data)  Does patient want to make changes to medical advance directive? No - Patient declined  Copy of Healthcare Power of Attorney in Chart? -     Acute visit-follow-up suspected COPD exacerbation  HPI:  Pt is a 71 y.o. male seen today for an acute visit for follow-up of COPD.  He was seen earlier this week for chest x-ray which showed a possible infiltrate in the right lower lobe.  I saw him yesterday and started him on a Z-Pak which she responded to apparently well in the past.  However the next day he was noted to have some increase in drooling and some hypoxia.  O2 sat was in the high 80s.  He was coughing more and sounded more congested.  Dr. Chales Abrahams did assess him and did switch his antibiotic to Levaquin he was also started on a short course of prednisone for the increased congestion.   He was continued on his nebulizers every 6 hours routinely as well as when necessary Robitussin.  Today's vital signs are stable O2 saturations are in the 90s nursing feels he is doing better he's back ambulating in his wheelchair in the hallway which is his baseline-when asked he says he feels fine.  He is on Coumadin with a history DVT pulmonary embolism and CVA--INR has been therapeutic but we will need to keep an eye on this since he is on Levaquin.  Is also  noted his pulse at times is above 100 he is receiving duo nebs-suspect this is possibly related to his nebulizers.  He is not complaining of any chest pain or palpitations     Past Medical History:  Diagnosis Date  . Dysphasia   . Fatty liver   . Hypertension   . Pneumonia   . Pulmonary embolism (HCC)   . Stroke [facility])    Past Surgical History:  Procedure Laterality Date  . unable      Allergies  Allergen Reactions  . Penicillins     Outpatient Encounter Prescriptions as of 10/20/2016  Medication Sig  . acetaminophen (TYLENOL) 325 MG tablet Take 650 mg by mouth every 6 (six) hours as needed.  Marland Kitchen azithromycin (ZITHROMAX) 250 MG tablet Take 250 mg by mouth daily.  . betamethasone dipropionate (DIPROLENE) 0.05 % cream Apply to legs and hands NOT TO FACE, GROIN,AXILLA TRY CERAVAE FIRST twice  day  . Dextromethorphan-Guaifenesin (ROBITUSSIN DM PO) Give 15 ml by mouth for cough every 6 hours as needed prn  . Eyelid Cleansers (SYSTANE LID WIPES EX) Start occusoft lid scrubs: clean eyelids of oil and debris BID OU ( each eye ) before administering eye drops.  Marland Kitchen ipratropium-albuterol (DUONEB) 0.5-2.5 (3) MG/3ML SOLN Give 1 inhalation every 6 hours from 10/18/2016-10/20/2016 then give twice a day  . labetalol (NORMODYNE) 200 MG tablet Take 200 mg by mouth daily. For HTN  . lactulose (CHRONULAC)  10 GM/15ML solution Give 15cc by mouth twice daily For constipation  . levofloxacin (LEVAQUIN) 500 MG tablet Take 500 mg by mouth daily.  Marland Kitchen lisinopril (PRINIVIL,ZESTRIL) 10 MG tablet Take 20 mg by mouth daily. For HTN  . loratadine (CLARITIN) 10 MG tablet Take 10 mg by mouth daily.  . polyvinyl alcohol (LIQUIFILM TEARS) 1.4 % ophthalmic solution Place 1 drop into both eyes 2 (two) times daily.  . predniSONE (DELTASONE) 20 MG tablet Take 40 mg by mouth daily with breakfast.  . sennosides-docusate sodium (SENOKOT-S) 8.6-50 MG tablet Take 1 tablet by mouth at bedtime. For constipation  . warfarin  (COUMADIN) 2 MG tablet Give 2 mg by mouth once a day  . White Petrolatum-Mineral Oil (SYSTANE NIGHTTIME) OINT Apply to eye. Apply to left eye at night   No facility-administered encounter medications on file as of 10/20/2016.     Review of Systems General no complaints of fever or chills.  Skin does not complaining of any increased bruising bleeding or rashes.  Eyes does not complain of visual changes from baseline.  Resp--does not complain of shortness breath or greatly increased cough-still has congestion--  Cardiac no chest pain has mild lower extremity edema which is baseline.  GI does not complain nausea vomiting diarrhea constipation or abdominal discomfort.  Muscle skeletal does not complain of joint pain is back ambulating in the hallway in his wheelchair.  Neurologic continues with right-sided weakness which is baseline upper and lower extremities with some contractures of his right hand-does not complain of headache dizziness or syncopal-type feelings.  Psych continues to be in good spirits pleasant does not complain of depression or anxiety  Immunization History  Administered Date(s) Administered  . Influenza-Unspecified 03/28/2014, 03/23/2016  . Pneumococcal-Unspecified 11/01/2009, 03/30/2016   Pertinent  Health Maintenance Due  Topic Date Due  . COLONOSCOPY  10/30/2025 (Originally 11/08/1995)  . INFLUENZA VACCINE  01/19/2017  . PNA vac Low Risk Adult (2 of 2 - PCV13) 03/30/2017   No flowsheet data found. Functional Status Survey:    Vitals:   10/20/16 1235  BP: (!) 135/92  Pulse: (!) 112  Resp: (!) 24  Temp: 97.9 F (36.6 C)  TempSrc: Oral  SpO2: 94%  Note I took his pulse manually and got 94  Physical Exam  In general this is a pleasant elderly male in no distress.Ambulating comfortably in his wheelchair  His skin is warm and dry.  I do not see any evidence of increased bruising or bleeding.  Oropharynx clear mucous membranes  moist.  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  He is back ambulating in the hallway today in his wheelchair  Neurologic is grossly intact continues with right-sided weakness which is not new. With some dysarthric speech which is baseline  Psych he is alert and oriented pleasant and appropriate   Labs reviewed:  Recent Labs  03/23/16 0730 07/28/16 0700 10/19/16 1150  NA 138 138 140  K 4.3 4.0 4.3  CL  106 102 103  CO2 GLUCOSE 77 87 172*  BUN 15 19 29*  CREATININE 1.08 1.07 1.13  CALCIUM 8.3* 8.5* 9.6    Recent Labs  07/28/16 0700 10/19/16 1150  AST 23 29  ALT 29 39  ALKPHOS 59 74  BILITOT 0.4 0.7  PROT 7.0 9.0*  ALBUMIN 3.3* 4.0    Recent Labs  12/19/15 0735 03/23/16 0730 07/28/16 0700 10/19/16 1150  WBC 8.6 7.4 8.4 11.5*  NEUTROABS 5.0  --  5.4 9.7*  HGB 15.0 15.5 14.9 16.4  HCT 45.6 46.5 44.8 50.2  MCV 94.2 93.8 92.8 92.1  PLT 277 262 278 349   Lab Results  Component Value Date   TSH 1.285 12/19/2015   Lab Results  Component Value Date   HGBA1C  03/27/2009    6.0 (NOTE) The ADA recommends the following therapeutic goal for glycemic control related to Hgb A1c measurement: Goal of therapy: <6.5 Hgb A1c  Reference: American Diabetes Association: Clinical Practice Recommendations 2010, Diabetes Care, 2010, 33: (Suppl  1).   Lab Results   Component Value Date   CHOL 117 07/28/2016   HDL 33 (L) 07/28/2016   LDLCALC 75 07/28/2016   TRIG 45 07/28/2016   CHOLHDL 3.5 07/28/2016    Significant Diagnostic Results in last 30 days:  No results found.  Assessment/Plan .COPD exacerbation O2 saturations have improved he is back ambulating in the hallway in his wheelchair-he still has congestion-he has on day 2 of 5 of prednisone will continue-is also been started on Levaquin   Continues on nebulizers at times is somewhat tachycardic will switch nebulizers from duo nebs to Atrovent every 6 hours routinely for for 72 hours and then when necessary.  Also he is on Coumadin again with a history CVA DVT and PE-will check an INR tomorrow since he is on Levaquin-last INR was therapeutic at 2.43 on lab done yesterday.  He also has a mildly elevated white count of 11.5 on lab done yesterday-urinalysis was done which shows positive nitrites and negative leukocytes 0-5 white blood cells and few bacteria will await final culture results.  .  Also will update a CBC with differential tomorrow.  [id number]       London Sheer, CMA [phone]

## 2016-10-21 ENCOUNTER — Telehealth: Payer: Self-pay

## 2016-10-21 ENCOUNTER — Emergency Department (HOSPITAL_COMMUNITY): Payer: Medicare Other

## 2016-10-21 ENCOUNTER — Encounter: Payer: Self-pay | Admitting: Internal Medicine

## 2016-10-21 ENCOUNTER — Inpatient Hospital Stay (HOSPITAL_COMMUNITY)
Admission: EM | Admit: 2016-10-21 | Discharge: 2016-10-23 | DRG: 388 | Disposition: A | Discharge status: Skilled Nursing Facility | Payer: Medicare Other | Attending: Family Medicine | Admitting: Family Medicine

## 2016-10-21 ENCOUNTER — Encounter (HOSPITAL_COMMUNITY)
Admission: RE | Admit: 2016-10-21 | Discharge: 2016-10-21 | Disposition: A | Discharge status: Home / Self Care | Payer: Medicare Other | Source: Skilled Nursing Facility | Attending: Internal Medicine | Admitting: Internal Medicine

## 2016-10-21 ENCOUNTER — Encounter (HOSPITAL_COMMUNITY)
Admission: EM | Disposition: A | Discharge status: Skilled Nursing Facility | Payer: Self-pay | Source: Home / Self Care | Attending: Family Medicine

## 2016-10-21 ENCOUNTER — Encounter (HOSPITAL_COMMUNITY): Payer: Self-pay

## 2016-10-21 ENCOUNTER — Other Ambulatory Visit: Payer: Self-pay

## 2016-10-21 DIAGNOSIS — K567 Ileus, unspecified: Secondary | ICD-10-CM | POA: Diagnosis present

## 2016-10-21 DIAGNOSIS — H02402 Unspecified ptosis of left eyelid: Secondary | ICD-10-CM | POA: Diagnosis present

## 2016-10-21 DIAGNOSIS — Z7951 Long term (current) use of inhaled steroids: Secondary | ICD-10-CM

## 2016-10-21 DIAGNOSIS — R778 Other specified abnormalities of plasma proteins: Secondary | ICD-10-CM

## 2016-10-21 DIAGNOSIS — R471 Dysarthria and anarthria: Secondary | ICD-10-CM | POA: Diagnosis present

## 2016-10-21 DIAGNOSIS — I82409 Acute embolism and thrombosis of unspecified deep veins of unspecified lower extremity: Secondary | ICD-10-CM | POA: Diagnosis present

## 2016-10-21 DIAGNOSIS — R0602 Shortness of breath: Secondary | ICD-10-CM | POA: Diagnosis not present

## 2016-10-21 DIAGNOSIS — R748 Abnormal levels of other serum enzymes: Secondary | ICD-10-CM

## 2016-10-21 DIAGNOSIS — I824Y9 Acute embolism and thrombosis of unspecified deep veins of unspecified proximal lower extremity: Secondary | ICD-10-CM | POA: Diagnosis not present

## 2016-10-21 DIAGNOSIS — J189 Pneumonia, unspecified organism: Secondary | ICD-10-CM | POA: Diagnosis present

## 2016-10-21 DIAGNOSIS — Z880 Allergy status to penicillin: Secondary | ICD-10-CM | POA: Diagnosis not present

## 2016-10-21 DIAGNOSIS — R000 Tachycardia, unspecified: Secondary | ICD-10-CM | POA: Diagnosis present

## 2016-10-21 DIAGNOSIS — Z7901 Long term (current) use of anticoagulants: Secondary | ICD-10-CM | POA: Diagnosis not present

## 2016-10-21 DIAGNOSIS — N390 Urinary tract infection, site not specified: Secondary | ICD-10-CM | POA: Diagnosis present

## 2016-10-21 DIAGNOSIS — R7989 Other specified abnormal findings of blood chemistry: Secondary | ICD-10-CM | POA: Diagnosis not present

## 2016-10-21 DIAGNOSIS — Z86718 Personal history of other venous thrombosis and embolism: Secondary | ICD-10-CM | POA: Diagnosis not present

## 2016-10-21 DIAGNOSIS — R0682 Tachypnea, not elsewhere classified: Secondary | ICD-10-CM | POA: Diagnosis not present

## 2016-10-21 DIAGNOSIS — I10 Essential (primary) hypertension: Secondary | ICD-10-CM | POA: Insufficient documentation

## 2016-10-21 DIAGNOSIS — R293 Abnormal posture: Secondary | ICD-10-CM | POA: Insufficient documentation

## 2016-10-21 DIAGNOSIS — K562 Volvulus: Secondary | ICD-10-CM | POA: Diagnosis not present

## 2016-10-21 DIAGNOSIS — I69354 Hemiplegia and hemiparesis following cerebral infarction affecting left non-dominant side: Secondary | ICD-10-CM

## 2016-10-21 DIAGNOSIS — G8191 Hemiplegia, unspecified affecting right dominant side: Secondary | ICD-10-CM | POA: Insufficient documentation

## 2016-10-21 DIAGNOSIS — J440 Chronic obstructive pulmonary disease with acute lower respiratory infection: Secondary | ICD-10-CM | POA: Diagnosis present

## 2016-10-21 DIAGNOSIS — K6389 Other specified diseases of intestine: Secondary | ICD-10-CM | POA: Diagnosis not present

## 2016-10-21 DIAGNOSIS — J441 Chronic obstructive pulmonary disease with (acute) exacerbation: Secondary | ICD-10-CM

## 2016-10-21 DIAGNOSIS — R1900 Intra-abdominal and pelvic swelling, mass and lump, unspecified site: Secondary | ICD-10-CM | POA: Diagnosis present

## 2016-10-21 DIAGNOSIS — R140 Abdominal distension (gaseous): Secondary | ICD-10-CM | POA: Diagnosis not present

## 2016-10-21 DIAGNOSIS — R791 Abnormal coagulation profile: Secondary | ICD-10-CM | POA: Diagnosis present

## 2016-10-21 DIAGNOSIS — N179 Acute kidney failure, unspecified: Secondary | ICD-10-CM | POA: Diagnosis present

## 2016-10-21 DIAGNOSIS — I69328 Other speech and language deficits following cerebral infarction: Secondary | ICD-10-CM | POA: Insufficient documentation

## 2016-10-21 DIAGNOSIS — K5900 Constipation, unspecified: Secondary | ICD-10-CM | POA: Diagnosis present

## 2016-10-21 DIAGNOSIS — Z79899 Other long term (current) drug therapy: Secondary | ICD-10-CM | POA: Diagnosis not present

## 2016-10-21 DIAGNOSIS — Z1623 Resistance to quinolones and fluoroquinolones: Secondary | ICD-10-CM | POA: Diagnosis present

## 2016-10-21 DIAGNOSIS — R1312 Dysphagia, oropharyngeal phase: Secondary | ICD-10-CM | POA: Diagnosis present

## 2016-10-21 DIAGNOSIS — Z7952 Long term (current) use of systemic steroids: Secondary | ICD-10-CM

## 2016-10-21 DIAGNOSIS — R509 Fever, unspecified: Secondary | ICD-10-CM | POA: Diagnosis not present

## 2016-10-21 DIAGNOSIS — A419 Sepsis, unspecified organism: Secondary | ICD-10-CM | POA: Diagnosis not present

## 2016-10-21 DIAGNOSIS — J449 Chronic obstructive pulmonary disease, unspecified: Secondary | ICD-10-CM | POA: Diagnosis present

## 2016-10-21 DIAGNOSIS — Z86711 Personal history of pulmonary embolism: Secondary | ICD-10-CM

## 2016-10-21 DIAGNOSIS — Z993 Dependence on wheelchair: Secondary | ICD-10-CM | POA: Insufficient documentation

## 2016-10-21 HISTORY — PX: FLEXIBLE SIGMOIDOSCOPY: SHX5431

## 2016-10-21 HISTORY — DX: Hemiplegia, unspecified affecting unspecified side: G81.90

## 2016-10-21 LAB — COMPREHENSIVE METABOLIC PANEL
ALT: 45 U/L (ref 17–63)
AST: 38 U/L (ref 15–41)
Albumin: 3.5 g/dL (ref 3.5–5.0)
Alkaline Phosphatase: 59 U/L (ref 38–126)
Anion gap: 9 (ref 5–15)
BILIRUBIN TOTAL: 0.7 mg/dL (ref 0.3–1.2)
BUN: 51 mg/dL — AB (ref 6–20)
CO2: 26 mmol/L (ref 22–32)
Calcium: 9.1 mg/dL (ref 8.9–10.3)
Chloride: 106 mmol/L (ref 101–111)
Creatinine, Ser: 1.72 mg/dL — ABNORMAL HIGH (ref 0.61–1.24)
GFR, EST AFRICAN AMERICAN: 45 mL/min — AB (ref >60)
GFR, EST NON AFRICAN AMERICAN: 39 mL/min — AB (ref >60)
Glucose, Bld: 163 mg/dL — ABNORMAL HIGH (ref 65–99)
POTASSIUM: 3.7 mmol/L (ref 3.5–5.1)
Sodium: 141 mmol/L (ref 135–145)
TOTAL PROTEIN: 8.2 g/dL — AB (ref 6.5–8.1)

## 2016-10-21 LAB — CBC WITH DIFFERENTIAL/PLATELET
BASOS ABS: 0 10*3/uL (ref 0.0–0.1)
BASOS ABS: 0 10*3/uL (ref 0.0–0.1)
BASOS PCT: 0 %
Basophils Relative: 0 %
EOS ABS: 0 10*3/uL (ref 0.0–0.7)
EOS ABS: 0 10*3/uL (ref 0.0–0.7)
EOS PCT: 0 %
EOS PCT: 0 %
HCT: 47 % (ref 39.0–52.0)
HEMATOCRIT: 46.5 % (ref 39.0–52.0)
Hemoglobin: 15.2 g/dL (ref 13.0–17.0)
Hemoglobin: 15.4 g/dL (ref 13.0–17.0)
LYMPHS PCT: 12 %
Lymphocytes Relative: 14 %
Lymphs Abs: 2 10*3/uL (ref 0.7–4.0)
Lymphs Abs: 2 10*3/uL (ref 0.7–4.0)
MCH: 30.2 pg (ref 26.0–34.0)
MCH: 30.3 pg (ref 26.0–34.0)
MCHC: 32.7 g/dL (ref 30.0–36.0)
MCHC: 32.8 g/dL (ref 30.0–36.0)
MCV: 92.3 fL (ref 78.0–100.0)
MCV: 92.5 fL (ref 78.0–100.0)
MONO ABS: 1.2 10*3/uL — AB (ref 0.1–1.0)
MONOS PCT: 8 %
Monocytes Absolute: 1.4 10*3/uL — ABNORMAL HIGH (ref 0.1–1.0)
Monocytes Relative: 8 %
NEUTROS PCT: 80 %
Neutro Abs: 11.4 10*3/uL — ABNORMAL HIGH (ref 1.7–7.7)
Neutro Abs: 13 10*3/uL — ABNORMAL HIGH (ref 1.7–7.7)
Neutrophils Relative %: 78 %
PLATELETS: 334 10*3/uL (ref 150–400)
PLATELETS: 347 10*3/uL (ref 150–400)
RBC: 5.04 MIL/uL (ref 4.22–5.81)
RBC: 5.08 MIL/uL (ref 4.22–5.81)
RDW: 13.7 % (ref 11.5–15.5)
RDW: 13.6 % (ref 11.5–15.5)
WBC: 14.5 10*3/uL — ABNORMAL HIGH (ref 4.0–10.5)
WBC: 16.4 10*3/uL — AB (ref 4.0–10.5)

## 2016-10-21 LAB — I-STAT CG4 LACTIC ACID, ED
LACTIC ACID, VENOUS: 1.2 mmol/L (ref 0.5–1.9)
Lactic Acid, Venous: 1.51 mmol/L (ref 0.5–1.9)

## 2016-10-21 LAB — TROPONIN I
Troponin I: 0.06 ng/mL (ref <0.03)
Troponin I: 0.05 ng/mL (ref <0.03)

## 2016-10-21 LAB — PROTIME-INR
INR: 2.9
Prothrombin Time: 30.9 seconds — ABNORMAL HIGH (ref 11.4–15.2)

## 2016-10-21 LAB — MRSA PCR SCREENING: MRSA BY PCR: NEGATIVE

## 2016-10-21 LAB — I-STAT CREATININE, ED: Creatinine, Ser: 0.6 mg/dL — ABNORMAL LOW (ref 0.61–1.24)

## 2016-10-21 LAB — BRAIN NATRIURETIC PEPTIDE: B Natriuretic Peptide: 58 pg/mL (ref 0.0–100.0)

## 2016-10-21 SURGERY — SIGMOIDOSCOPY, FLEXIBLE
Anesthesia: Moderate Sedation

## 2016-10-21 MED ORDER — WARFARIN - PHARMACIST DOSING INPATIENT: Freq: Every day | Status: DC

## 2016-10-21 MED ORDER — IPRATROPIUM-ALBUTEROL 0.5-2.5 (3) MG/3ML IN SOLN
3.0000 mL | RESPIRATORY_TRACT | Status: DC
Start: 2016-10-21 — End: 2016-10-21
  Administered 2016-10-21 (×2): 3 mL via RESPIRATORY_TRACT
  Filled 2016-10-21 (×2): qty 3

## 2016-10-21 MED ORDER — IPRATROPIUM-ALBUTEROL 0.5-2.5 (3) MG/3ML IN SOLN
3.0000 mL | Freq: Three times a day (TID) | RESPIRATORY_TRACT | Status: DC
  Administered 2016-10-21 – 2016-10-23 (×6): 3 mL via RESPIRATORY_TRACT
  Filled 2016-10-21 (×6): qty 3

## 2016-10-21 MED ORDER — IPRATROPIUM-ALBUTEROL 0.5-2.5 (3) MG/3ML IN SOLN: 3.0000 mL | Freq: Three times a day (TID) | RESPIRATORY_TRACT | Status: DC

## 2016-10-21 MED ORDER — LABETALOL HCL 200 MG PO TABS
200.0000 mg | ORAL_TABLET | Freq: Every day | ORAL | Status: DC
  Administered 2016-10-21 – 2016-10-23 (×3): 200 mg via ORAL
  Filled 2016-10-21 (×3): qty 1

## 2016-10-21 MED ORDER — LISINOPRIL 10 MG PO TABS
20.0000 mg | ORAL_TABLET | Freq: Every day | ORAL | Status: DC
  Administered 2016-10-21 – 2016-10-23 (×3): 20 mg via ORAL
  Filled 2016-10-21 (×3): qty 2

## 2016-10-21 MED ORDER — SODIUM CHLORIDE 0.9 % IV SOLN: INTRAVENOUS | Status: DC

## 2016-10-21 MED ORDER — SODIUM CHLORIDE 0.9 % IV SOLN
INTRAVENOUS | Status: AC
  Administered 2016-10-21: 19:00:00 via INTRAVENOUS

## 2016-10-21 MED ORDER — CIPROFLOXACIN IN D5W 400 MG/200ML IV SOLN
400.0000 mg | Freq: Two times a day (BID) | INTRAVENOUS | Status: DC
  Administered 2016-10-21 – 2016-10-22 (×2): 400 mg via INTRAVENOUS
  Filled 2016-10-21 (×2): qty 200

## 2016-10-21 MED ORDER — IPRATROPIUM BROMIDE 0.02 % IN SOLN: 0.5000 mg | Freq: Four times a day (QID) | RESPIRATORY_TRACT | Status: DC

## 2016-10-21 MED ORDER — VANCOMYCIN HCL IN DEXTROSE 1-5 GM/200ML-% IV SOLN
1000.0000 mg | Freq: Once | INTRAVENOUS | Status: AC
Start: 2016-10-21 — End: 2016-10-21
  Administered 2016-10-21: 1000 mg via INTRAVENOUS
  Filled 2016-10-21: qty 200

## 2016-10-21 MED ORDER — SODIUM CHLORIDE 0.9 % IV BOLUS (SEPSIS)
2000.0000 mL | Freq: Once | INTRAVENOUS | Status: AC
  Administered 2016-10-21: 2000 mL via INTRAVENOUS

## 2016-10-21 MED ORDER — SODIUM CHLORIDE 0.9 % IV BOLUS (SEPSIS)
1000.0000 mL | Freq: Once | INTRAVENOUS | Status: AC
  Administered 2016-10-21: 1000 mL via INTRAVENOUS

## 2016-10-21 MED ORDER — DEXTROSE 5 % IV SOLN
2.0000 g | Freq: Once | INTRAVENOUS | Status: AC
  Administered 2016-10-21: 2 g via INTRAVENOUS
  Filled 2016-10-21 (×2): qty 2

## 2016-10-21 MED ORDER — ACETAMINOPHEN 650 MG RE SUPP
650.0000 mg | Freq: Once | RECTAL | Status: AC
  Administered 2016-10-21: 650 mg via RECTAL
  Filled 2016-10-21: qty 1

## 2016-10-21 MED ORDER — WARFARIN SODIUM 1 MG PO TABS
1.0000 mg | ORAL_TABLET | Freq: Once | ORAL | Status: AC
  Administered 2016-10-21: 1 mg via ORAL
  Filled 2016-10-21: qty 1

## 2016-10-21 NOTE — ED Notes (Signed)
Dr. Karilyn Cotaehman requesting pt be brought to endo asap.  He said he would give the nurses report.

## 2016-10-21 NOTE — Progress Notes (Signed)
ANTICOAGULATION CONSULT NOTE - Initial Consult  Pharmacy Consult for Coumadin Indication: History DVT  Allergies  Allergen Reactions  . Penicillins     Has patient had a PCN reaction causing immediate rash, facial/tongue/throat swelling, SOB or lightheadedness with hypotension: unknown Has patient had a PCN reaction causing severe rash involving mucus membranes or skin necrosis: unknown Has patient had a PCN reaction that required hospitalization: unknown Has patient had a PCN reaction occurring within the last 10 years: unknown If all of the above answers are "NO", then may proceed with Cephalosporin use.     Patient Measurements: Height: 5\' 10"  (177.8 cm) Weight: 208 lb (94.3 kg) IBW/kg (Calculated) : 73 Heparin Dosing Weight:   Vital Signs: Temp: 97.8 F (36.6 C) (05/03 1413) Temp Source: Oral (05/03 1413) BP: 122/84 (05/03 1525) Pulse Rate: 94 (05/03 1525)  Labs:  Recent Labs  10/19/16 1150 10/21/16 0700 10/21/16 0914 10/21/16 1401  HGB 16.4 15.2 15.4  --   HCT 50.2 46.5 47.0  --   PLT 349 334 347  --   LABPROT 26.8* 30.9*  --   --   INR 2.43 2.90  --   --   CREATININE 1.13  --  1.72* 0.60*  TROPONINI  --   --  0.06*  --     Estimated Creatinine Clearance: 99 mL/min (A) (by C-G formula based on SCr of 0.6 mg/dL (L)).   Medical History: Past Medical History:  Diagnosis Date  . Dysphasia   . Fatty liver   . Hemiplegia (HCC)   . Hypertension   . Pneumonia   . Pulmonary embolism (HCC)   . Stroke [facility](HCC)     Medications:  Prescriptions Prior to Admission  Medication Sig Dispense Refill Last Dose  . acetaminophen (TYLENOL) 325 MG tablet Take 650 mg by mouth every 6 (six) hours as needed for moderate pain.    unknown  . betamethasone dipropionate (DIPROLENE) 0.05 % cream Apply 1 application topically 2 (two) times daily. Apply to legs and hands NOT TO FACE, GROIN,AXILLA TRY CERAVAE FIRST twice  day    10/20/2016 at Unknown time  .  Dextromethorphan-Guaifenesin (ROBITUSSIN DM PO) Take 15 mLs by mouth every 6 (six) hours as needed (cough).    unknown  . Eyelid Cleansers (SYSTANE LID WIPES EX) Apply 1 application topically 2 (two) times daily. Start occusoft lid scrubs: clean eyelids of oil and debris BID OU ( each eye ) before administering eye drops.    10/20/2016 at Unknown time  . ipratropium (ATROVENT) 0.02 % nebulizer solution Take 0.5 mg by nebulization every 6 (six) hours.   10/21/2016 at Unknown time  . labetalol (NORMODYNE) 200 MG tablet Take 200 mg by mouth daily. For HTN   10/20/2016 at 0800  . lactulose (CHRONULAC) 10 GM/15ML solution Give 15cc by mouth twice daily For constipation   10/20/2016 at Unknown time  . levofloxacin (LEVAQUIN) 500 MG tablet Take 500 mg by mouth daily.   10/20/2016 at Unknown time  . lisinopril (PRINIVIL,ZESTRIL) 20 MG tablet Take 20 mg by mouth daily.   10/20/2016 at Unknown time  . loratadine (CLARITIN) 10 MG tablet Take 10 mg by mouth daily.   10/20/2016 at Unknown time  . polyvinyl alcohol (LIQUIFILM TEARS) 1.4 % ophthalmic solution Place 1 drop into both eyes 2 (two) times daily.   10/20/2016 at Unknown time  . Pramoxine HCl (CERAVE ITCH RELIEF EX) Apply 1 application topically daily as needed (itching).   unknown  . predniSONE (DELTASONE) 20  MG tablet Take 40 mg by mouth daily with breakfast.   10/20/2016 at Unknown time  . sennosides-docusate sodium (SENOKOT-S) 8.6-50 MG tablet Take 1 tablet by mouth at bedtime. For constipation   10/20/2016 at Unknown time  . warfarin (COUMADIN) 2 MG tablet Take 2 mg by mouth daily at 6 PM. Give 2 mg by mouth once a day   10/20/2016 at 1700  . White Petrolatum-Mineral Oil (SYSTANE NIGHTTIME) OINT Place 1 application into the left eye at bedtime. Apply to left eye at night    10/20/2016 at Unknown time    Assessment: Continuation of Coumadin PTA INR 2.9 today  Goal of Therapy:  INR 2-3 Monitor platelets by anticoagulation protocol: Yes   Plan:  Coumadin 1 mg po x 1  dose tonight (lower than home dose due to INR higher range of therapeutic INR/PT daily  Raquel James, Brenda Cowher [place] 10/21/2016,6:38 PM

## 2016-10-21 NOTE — Consult Note (Signed)
Reason for Consult: 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Referring Physician: ER, Dr. Shirley Friar is an 71 y.o. male.  HPI: Patient is a 71 year old black male who was referred to the emergency room from the [facility] for evaluation and treatment of abdominal swelling and discomfort. A CT scan of the abdomen was being performed for kidney disease and the patient was found to have a sigmoid volvulus. I was consulted for further evaluation and treatment. Patient is not very communicative. In talking to the son, the patient has not had an episode like this before. He has had episodes of constipation in the past.  Past Medical History:  Diagnosis Date  . Dysphasia   . Fatty liver   . Hemiplegia ([place])   . Hypertension   . Pneumonia   . Pulmonary embolism ([place])   . Stroke [facility])     Past Surgical History:  Procedure Laterality Date  . unable      No family history on file.  Social History:  reports that he has never smoked. He has never used smokeless tobacco. He reports that he does not drink alcohol or use drugs.  Allergies:  Allergies  Allergen Reactions  . Penicillins     Has patient had a PCN reaction causing immediate rash, facial/tongue/throat swelling, SOB or lightheadedness with hypotension: unknown Has patient had a PCN reaction causing severe rash involving mucus membranes or skin necrosis: unknown Has patient had a PCN reaction that required hospitalization: unknown Has patient had a PCN reaction occurring within the last 10 years: unknown If all of the above answers are "NO", then may proceed with Cephalosporin use.     Medications:  Prior to Admission:  (Not in a hospital admission) Scheduled: . ipratropium-albuterol  3 mL Nebulization Q4H    Results for orders placed or performed during the hospital encounter of 10/21/16 (from the past 48 hour(s))  Comprehensive metabolic panel     Status: Abnormal   Collection Time: 10/21/16  9:14 AM  Result Value Ref  Range   Sodium 141 135 - 145 mmol/L   Potassium 3.7 3.5 - 5.1 mmol/L   Chloride 106 101 - 111 mmol/L   CO2 26 22 - 32 mmol/L   Glucose, Bld 163 (H) 65 - 99 mg/dL   BUN 51 (H) 6 - 20 mg/dL   Creatinine, Ser 1.72 (H) 0.61 - 1.24 mg/dL   Calcium 9.1 8.9 - 10.3 mg/dL   Total Protein 8.2 (H) 6.5 - 8.1 g/dL   Albumin 3.5 3.5 - 5.0 g/dL   AST 38 15 - 41 U/L   ALT 45 17 - 63 U/L   Alkaline Phosphatase 59 38 - 126 U/L   Total Bilirubin 0.7 0.3 - 1.2 mg/dL   GFR calc non Af Amer 39 (L) >60 mL/min   GFR calc Af Amer 45 (L) >60 mL/min    Comment: (NOTE) The eGFR has been calculated using the CKD EPI equation. This calculation has not been validated in all clinical situations. eGFR's persistently <60 mL/min signify possible Chronic Kidney Disease.    Anion gap 9 5 - 15  CBC WITH DIFFERENTIAL     Status: Abnormal   Collection Time: 10/21/16  9:14 AM  Result Value Ref Range   WBC 16.4 (H) 4.0 - 10.5 K/uL   RBC 5.08 4.22 - 5.81 MIL/uL   Hemoglobin 15.4 13.0 - 17.0 g/dL   HCT 47.0 39.0 - 52.0 %   MCV 92.5 78.0 - 100.0 fL  MCH 30.3 26.0 - 34.0 pg   MCHC 32.8 30.0 - 36.0 g/dL   RDW 13.6 11.5 - 15.5 %   Platelets 347 150 - 400 K/uL   Neutrophils Relative % 80 %   Neutro Abs 13.0 (H) 1.7 - 7.7 K/uL   Lymphocytes Relative 12 %   Lymphs Abs 2.0 0.7 - 4.0 K/uL   Monocytes Relative 8 %   Monocytes Absolute 1.4 (H) 0.1 - 1.0 K/uL   Eosinophils Relative 0 %   Eosinophils Absolute 0.0 0.0 - 0.7 K/uL   Basophils Relative 0 %   Basophils Absolute 0.0 0.0 - 0.1 K/uL  Blood Culture (routine x 2)     Status: None (Preliminary result)   Collection Time: 10/21/16  9:14 AM  Result Value Ref Range   Specimen Description BLOOD RIGHT HAND    Special Requests      BOTTLES DRAWN AEROBIC AND ANAEROBIC Blood Culture adequate volume   Culture PENDING    Report Status PENDING   Troponin I     Status: Abnormal   Collection Time: 10/21/16  9:14 AM  Result Value Ref Range   Troponin I 0.06 (HH) <0.03 ng/mL     Comment: CRITICAL RESULT CALLED TO, READ BACK BY AND VERIFIED WITH: MITCHELL,M AT 9:55AM ON 10/21/16 BY FESTERMAN,C   Brain natriuretic peptide     Status: None   Collection Time: 10/21/16  9:14 AM  Result Value Ref Range   B Natriuretic Peptide 58.0 0.0 - 100.0 pg/mL  I-Stat CG4 Lactic Acid, ED  (not at  [facility])     Status: None   Collection Time: 10/21/16  9:26 AM  Result Value Ref Range   Lactic Acid, Venous 1.51 0.5 - 1.9 mmol/L  Blood Culture (routine x 2)     Status: None (Preliminary result)   Collection Time: 10/21/16  9:27 AM  Result Value Ref Range   Specimen Description BLOOD RIGHT FOREARM    Special Requests      BOTTLES DRAWN AEROBIC AND ANAEROBIC Blood Culture adequate volume   Culture PENDING    Report Status PENDING   I-Stat CG4 Lactic Acid, ED  (not at  [facility])     Status: None   Collection Time: 10/21/16 12:36 PM  Result Value Ref Range   Lactic Acid, Venous 1.20 0.5 - 1.9 mmol/L    Dg Chest Port 1 View  Result Date: 10/21/2016 CLINICAL DATA:  Shortness of breath and fevers EXAM: PORTABLE CHEST 1 VIEW COMPARISON:  03/20/2015 FINDINGS: Cardiac shadow is mildly enlarged but stable. Overall poor inspiratory effort is noted. Some basilar atelectasis is seen likely related to the poor inspiratory effort. No sizable effusion is seen. No bony abnormality is noted. IMPRESSION: Poor inspiratory effort with basilar atelectasis. Electronically Signed   By: Inez Catalina M.D.   On: 10/21/2016 09:47   Ct Renal Stone Study  Result Date: 10/21/2016 CLINICAL DATA:  Abdominal distention and shortness of Breath EXAM: CT ABDOMEN AND PELVIS WITHOUT CONTRAST TECHNIQUE: Multidetector CT imaging of the abdomen and pelvis was performed following the standard protocol without IV contrast. COMPARISON:  12/28/2007 FINDINGS: Lower chest: Bibasilar consolidation is noted right greater than left. Hepatobiliary: With the exception of a small cyst in the right lobe of the liver the liver and  gallbladder are within normal limits. Pancreas: Unremarkable. No pancreatic ductal dilatation or surrounding inflammatory changes. Spleen: Normal in size without focal abnormality. Adrenals/Urinary Tract: The adrenal glands are within normal limits. Renal vascular calcifications are noted. Mild  fullness of the left collecting system is seen which may be related to edema from recently passed stone as no definitive stone is identified. The bladder is well distended. Stomach/Bowel: The colon is distended with air and fecal material. There is an area of focal narrowing in the sigmoid colon best seen on image number 54 of series 2 and image 53 of series 7. There are changes consistent with a mobile sigmoid and early sigmoid volvulus. The colon proximal to the area of narrowing in the sigmoid is significantly dilated. On the coronal imaging significant rotation of the sigmoid and its associated vascularity is noted. The appendix is within normal limits. No other bowel abnormality is seen. Vascular/Lymphatic: Aortic atherosclerosis. No enlarged abdominal or pelvic lymph nodes. Reproductive: Prostate is unremarkable. Other: Small fat containing umbilical hernia is noted. Musculoskeletal: Degenerative changes of lumbar spine are seen. No acute bony abnormality is noted. IMPRESSION: Changes consistent with sigmoid volume ileus and proximal colonic obstructive change. The distal most: Beyond the volvulus is decompressed. Surgical consultation is recommended. Mild fullness of the left renal collecting system without definitive stone. These results were called by telephone at the time of interpretation on 10/21/2016 at 11:32 am to Dr. Merrily Pew , who verbally acknowledged these results. Electronically Signed   By: Inez Catalina M.D.   On: 10/21/2016 11:33    ROS:  Review of systems not obtained due to patient factors.  Blood pressure 126/83, pulse (!) 107, temperature 97.8 F (36.6 C), temperature source Rectal, resp.  rate (!) 21, height 5' 10"  (1.778 m), weight 208 lb (94.3 kg), SpO2 96 %. Physical Exam: Comfortable appearing black male in no acute distress. Head is normocephalic, atraumatic. Lungs clear auscultation with equal breath sounds bilaterally. Heart examination reveals a regular rate and rhythm without S3, S4, murmurs. Mild tachycardia is noted. Abdomen is distended but not tense. No tenderness was elicited on examination. Limited bowel sounds are present. An umbilical hernia is present. Could not assess hepatosplenomegaly due to body habitus. CT scan images personally reviewed. Labs reviewed.  Assessment/Plan: Impression: Sigmoid volvulus, history of CVA, chronic anticoagulation, history of pulmonary embolism Plan: Dr. Laural Golden of GI has been consulted and will try to do a decompressive colonoscopy to reduce the volvulus. I did have an extensive discussion with the son that if surgery was required, he would have a colostomy.  The patient has a high risk for surgical intervention due to his comorbidities. The son understood this. He is a full code at the present time. INR is elevated, and this will be addressed should the patient require surgery. Further management is pending colonoscopy results.  Aviva Signs 10/21/2016, 1:11 PM

## 2016-10-21 NOTE — ED Notes (Signed)
Pt states he does not know reaction to penicillin. States that his throat has never closed because of it. Dr. Christia ReadingMesner Okay with starting maxipime.

## 2016-10-21 NOTE — ED Notes (Signed)
EKG given to Dr. Clayborne DanaMesner by nurse tech.

## 2016-10-21 NOTE — Telephone Encounter (Signed)
This is a patient of [facility], who was [facility]. Hospital discharge from [facility] on 10/21/2016

## 2016-10-21 NOTE — ED Notes (Signed)
Dr. Mesner at bedside   

## 2016-10-21 NOTE — Consult Note (Signed)
Reason for Consult: Volvulus Referring Physician:  Patient is on Coumadin  [name] [name] is an 71 y.o. male.  HPI: Patient seen in the ED. Brother in room. Patient is a resident of [facility].  Hx of old CVA. Paralysis left side. Brother states patient presently being treated for pneumonia at the [facility]. Covered with Z pak Noticed abdominal distention Sunday. Transferred to [facility]. CT scan revealed: IMPRESSION: Changes consistent with sigmoid volume ileus and proximal colonic obstructive change. The distal most: Beyond the volvulus is decompressed. Surgical consultation is recommended. No nausea or vomiting. Did have one BM yesterday. Has had fever.     Past Medical History:  Diagnosis Date  . Dysphasia   . Fatty liver   . Hemiplegia ([place])   . Hypertension   . Pneumonia   . Pulmonary embolism ([place])   . Stroke [facility])     Past Surgical History:  Procedure Laterality Date  . unable      No family history on file.  Social History:  reports that he has never smoked. He has never used smokeless tobacco. He reports that he does not drink alcohol or use drugs.  Allergies:  Allergies  Allergen Reactions  . Penicillins     Has patient had a PCN reaction causing immediate rash, facial/tongue/throat swelling, SOB or lightheadedness with hypotension: unknown Has patient had a PCN reaction causing severe rash involving mucus membranes or skin necrosis: unknown Has patient had a PCN reaction that required hospitalization: unknown Has patient had a PCN reaction occurring within the last 10 years: unknown If all of the above answers are "NO", then may proceed with Cephalosporin use.     Medications: I have reviewed the patient's current medications.  Results for orders placed or performed during the hospital encounter of 10/21/16 (from the past 48 hour(s))  Comprehensive metabolic panel     Status: Abnormal   Collection Time: 10/21/16  9:14 AM  Result Value Ref Range   Sodium  141 135 - 145 mmol/L   Potassium 3.7 3.5 - 5.1 mmol/L   Chloride 106 101 - 111 mmol/L   CO2 26 22 - 32 mmol/L   Glucose, Bld 163 (H) 65 - 99 mg/dL   BUN 51 (H) 6 - 20 mg/dL   Creatinine, Ser 1.72 (H) 0.61 - 1.24 mg/dL   Calcium 9.1 8.9 - 10.3 mg/dL   Total Protein 8.2 (H) 6.5 - 8.1 g/dL   Albumin 3.5 3.5 - 5.0 g/dL   AST 38 15 - 41 U/L   ALT 45 17 - 63 U/L   Alkaline Phosphatase 59 38 - 126 U/L   Total Bilirubin 0.7 0.3 - 1.2 mg/dL   GFR calc non Af Amer 39 (L) >60 mL/min   GFR calc Af Amer 45 (L) >60 mL/min    Comment: (NOTE) The eGFR has been calculated using the CKD EPI equation. This calculation has not been validated in all clinical situations. eGFR's persistently <60 mL/min signify possible Chronic Kidney Disease.    Anion gap 9 5 - 15  CBC WITH DIFFERENTIAL     Status: Abnormal   Collection Time: 10/21/16  9:14 AM  Result Value Ref Range   WBC 16.4 (H) 4.0 - 10.5 K/uL   RBC 5.08 4.22 - 5.81 MIL/uL   Hemoglobin 15.4 13.0 - 17.0 g/dL   HCT 47.0 39.0 - 52.0 %   MCV 92.5 78.0 - 100.0 fL   MCH 30.3 26.0 - 34.0 pg   MCHC 32.8 30.0 -  36.0 g/dL   RDW 13.6 11.5 - 15.5 %   Platelets 347 150 - 400 K/uL   Neutrophils Relative % 80 %   Neutro Abs 13.0 (H) 1.7 - 7.7 K/uL   Lymphocytes Relative 12 %   Lymphs Abs 2.0 0.7 - 4.0 K/uL   Monocytes Relative 8 %   Monocytes Absolute 1.4 (H) 0.1 - 1.0 K/uL   Eosinophils Relative 0 %   Eosinophils Absolute 0.0 0.0 - 0.7 K/uL   Basophils Relative 0 %   Basophils Absolute 0.0 0.0 - 0.1 K/uL  Blood Culture (routine x 2)     Status: None (Preliminary result)   Collection Time: 10/21/16  9:14 AM  Result Value Ref Range   Specimen Description BLOOD RIGHT HAND    Special Requests      BOTTLES DRAWN AEROBIC AND ANAEROBIC Blood Culture adequate volume   Culture PENDING    Report Status PENDING   Troponin I     Status: Abnormal   Collection Time: 10/21/16  9:14 AM  Result Value Ref Range   Troponin I 0.06 (HH) <0.03 ng/mL    Comment:  CRITICAL RESULT CALLED TO, READ BACK BY AND VERIFIED WITH: MITCHELL,M AT 9:55AM ON 10/21/16 BY FESTERMAN,C   Brain natriuretic peptide     Status: None   Collection Time: 10/21/16  9:14 AM  Result Value Ref Range   B Natriuretic Peptide 58.0 0.0 - 100.0 pg/mL  I-Stat CG4 Lactic Acid, ED  (not at  [facility])     Status: None   Collection Time: 10/21/16  9:26 AM  Result Value Ref Range   Lactic Acid, Venous 1.51 0.5 - 1.9 mmol/L  Blood Culture (routine x 2)     Status: None (Preliminary result)   Collection Time: 10/21/16  9:27 AM  Result Value Ref Range   Specimen Description BLOOD RIGHT FOREARM    Special Requests      BOTTLES DRAWN AEROBIC AND ANAEROBIC Blood Culture adequate volume   Culture PENDING    Report Status PENDING   I-Stat CG4 Lactic Acid, ED  (not at  [facility])     Status: None   Collection Time: 10/21/16 12:36 PM  Result Value Ref Range   Lactic Acid, Venous 1.20 0.5 - 1.9 mmol/L    Dg Chest Port 1 View  Result Date: 10/21/2016 CLINICAL DATA:  Shortness of breath and fevers EXAM: PORTABLE CHEST 1 VIEW COMPARISON:  03/20/2015 FINDINGS: Cardiac shadow is mildly enlarged but stable. Overall poor inspiratory effort is noted. Some basilar atelectasis is seen likely related to the poor inspiratory effort. No sizable effusion is seen. No bony abnormality is noted. IMPRESSION: Poor inspiratory effort with basilar atelectasis. Electronically Signed   By: Inez Catalina M.D.   On: 10/21/2016 09:47   Ct Renal Stone Study  Result Date: 10/21/2016 CLINICAL DATA:  Abdominal distention and shortness of Breath EXAM: CT ABDOMEN AND PELVIS WITHOUT CONTRAST TECHNIQUE: Multidetector CT imaging of the abdomen and pelvis was performed following the standard protocol without IV contrast. COMPARISON:  12/28/2007 FINDINGS: Lower chest: Bibasilar consolidation is noted right greater than left. Hepatobiliary: With the exception of a small cyst in the right lobe of the liver the liver and gallbladder are within  normal limits. Pancreas: Unremarkable. No pancreatic ductal dilatation or surrounding inflammatory changes. Spleen: Normal in size without focal abnormality. Adrenals/Urinary Tract: The adrenal glands are within normal limits. Renal vascular calcifications are noted. Mild fullness of the left collecting system is seen which may be related  to edema from recently passed stone as no definitive stone is identified. The bladder is well distended. Stomach/Bowel: The colon is distended with air and fecal material. There is an area of focal narrowing in the sigmoid colon best seen on image number 54 of series 2 and image 53 of series 7. There are changes consistent with a mobile sigmoid and early sigmoid volvulus. The colon proximal to the area of narrowing in the sigmoid is significantly dilated. On the coronal imaging significant rotation of the sigmoid and its associated vascularity is noted. The appendix is within normal limits. No other bowel abnormality is seen. Vascular/Lymphatic: Aortic atherosclerosis. No enlarged abdominal or pelvic lymph nodes. Reproductive: Prostate is unremarkable. Other: Small fat containing umbilical hernia is noted. Musculoskeletal: Degenerative changes of lumbar spine are seen. No acute bony abnormality is noted. IMPRESSION: Changes consistent with sigmoid volume ileus and proximal colonic obstructive change. The distal most: Beyond the volvulus is decompressed. Surgical consultation is recommended. Mild fullness of the left renal collecting system without definitive stone. These results were called by telephone at the time of interpretation on 10/21/2016 at 11:32 am to Dr. Merrily Pew , who verbally acknowledged these results. Electronically Signed   By: Inez Catalina M.D.   On: 10/21/2016 11:33    ROS Blood pressure 110/67, pulse (!) 110, temperature 97.8 F (36.6 C), temperature source Rectal, resp. rate (!) 26, height 5' 10" (1.778 m), weight 208 lb (94.3 kg), SpO2 94 %. Physical  Exam Alert and oriented. Skin warm and dry. Oral mucosa is moist.   . Sclera anicteric, conjunctivae is pink. Thyroid not enlarged. No cervical lymphadenopathy. Lungs: Rhonchi noted.  Heart regular rate and rhythm.  Abdomen is soft. Bowel sounds are positive. No hepatomegaly.Abdominal distention, slightly tense. masses felt. No tenderness.  No edema to lower extremities.        Assessment/Plan:Volvulus. Dr. Laural Golden is aware. Plan for colonoscopy today.    , W 10/21/2016, 12:51 PM

## 2016-10-21 NOTE — Progress Notes (Signed)
Informed by ED nurse, Eustace QuailMegan Mitchell, that creatinine result 0.6 was not correct for this patient. Apparently, this patient's information was matched with another patient's sample.

## 2016-10-21 NOTE — ED Notes (Signed)
I-stat creatinine resulted in pt chart five minutes before transport to Endoscopy, no one in ED recalls running this lab, nor was one ordered on pt. Unable to recall who ran I-stat on pt. Endoscopy denies running I-stat on pt.

## 2016-10-21 NOTE — ED Notes (Signed)
Carla DrapeUpdated Clare on Dept 300 floor that creatinine that resulted on this patient at 1357 today was ran in error by a tech in ED.  Lowella DandyClare will update attending on this issue.

## 2016-10-21 NOTE — Op Note (Addendum)
[facility] Patient Name: [name] AlbertsScoville Vivar Procedure Date: 10/21/2016 2:44 PM MRN: [id number] Date of Birth: [birth year] Attending MD: Lionel DecemberNajeeb Adeleine Pask , MD CSN: [id number] Age: 71 Admit Type: Inpatient Procedure:                Flexible Sigmoidoscopy Indications:              For therapy of sigmoid volvulus Providers:                Lionel DecemberNajeeb Lovenia Debruler, MD, Nena PolioLisa Moore, RN, Burke Keelsrisann Tilley,                            Technician Referring MD:             Marily MemosJason Mesner, MD Medicines:                no sedation administered for the procedure. Complications:            No immediate complications. Estimated Blood Loss:     Estimated blood loss: none. Procedure:                Pre-Anesthesia Assessment:                           - Prior to the procedure, a History and Physical                            was performed, and patient medications and                            allergies were reviewed. The patient's tolerance of                            previous anesthesia was also reviewed. The risks                            and benefits of the procedure and the sedation                            options and risks were discussed with the patient.                            All questions were answered, and informed consent                            was obtained. Prior Anticoagulants: The patient                            last took Coumadin (warfarin) 1 day prior to the                            procedure. ASA Grade Assessment: IV - A patient                            with severe systemic disease that is a constant  threat to life. After reviewing the risks and                            benefits, the patient was deemed in satisfactory                            condition to undergo the procedure.                           After obtaining informed consent, the scope was                            passed under direct vision. The EC-[id number] ([id number])                             scope was introduced through the anus and advanced                            to the the sigmoid colon. The flexible                            sigmoidoscopy was accomplished without difficulty.                            The patient tolerated the procedure well. The                            quality of the bowel preparation was adequate. Scope In: 3:12:39 PM Scope Out: 3:18:55 PM Total Procedure Duration: 0 hours 6 minutes 16 seconds  Findings:      The perianal and digital rectal examinations were normal.      A volvulus, with viable appearing mucosa, was found in the recto-sigmoid       colon. Decompression of the volvulus was attempted and was successful,       with complete decompression achieved.      A few medium-mouthed diverticula were found in the sigmoid colon. Impression:               - Volvulus. Successful complete decompression                            achieved.                           - Few diverticula at sigmoid col                           - No specimens collected. Moderate Sedation:      None Recommendation:           - Admit the patient to hospital ward for ongoing                            care.                           - Resume previous diet today.                           -  polyethylene glycol 17 g by mouth daily.                           - Repeat intervention on as-needed basis Procedure Code(s):        --- Professional ---                           [phone], Sigmoidoscopy, flexible; with decompression                            (for pathologic distention) (eg, volvulus,                            megacolon), including placement of decompression                            tube, when performed Diagnosis Code(s):        --- Professional ---                           U04.5, Volvulus CPT copyright 2016 [employer]. All rights reserved. The codes documented in this report are preliminary and upon coder review may  be revised to meet  current compliance requirements. Lionel December, MD Lionel December, MD 10/21/2016 3:31:58 PM This report has been signed electronically. Number of Addenda: 0

## 2016-10-21 NOTE — ED Notes (Signed)
CRITICAL VALUE ALERT  Critical value received: troponin 0.06 Date of notification:  10/21/16 Time of notification:  0959  Critical value read back:Yes.    Nurse who received alert:  Gertha CalkinMeagan Chey Cho.,rn   MD notified (1st page):  Dr. Clayborne DanaMesner

## 2016-10-21 NOTE — Progress Notes (Signed)
Pharmacy Antibiotic Note  [name] [name] is a 71 y.o. male admitted on 10/21/2016 with UTI.  Pharmacy has been consulted for Cipro dosing.  Plan: Cipro 400 mg IV every 12 hours  Height: 5\' 10"  (177.8 cm) Weight: 208 lb (94.3 kg) IBW/kg (Calculated) : 73  Temp (24hrs), Avg:98.8 F (37.1 C), Min:97.8 F (36.6 C), Max:101.2 F (38.4 C)   Recent Labs Lab 10/19/16 1150 10/21/16 0700 10/21/16 0914 10/21/16 0926 10/21/16 1236 10/21/16 1401  WBC 11.5* 14.5* 16.4*  --   --   --   CREATININE 1.13  --  1.72*  --   --  0.60*  LATICACIDVEN  --   --   --  1.51 1.20  --     Estimated Creatinine Clearance: 99 mL/min (A) (by C-G formula based on SCr of 0.6 mg/dL (L)).    Allergies  Allergen Reactions  . Penicillins     Has patient had a PCN reaction causing immediate rash, facial/tongue/throat swelling, SOB or lightheadedness with hypotension: unknown Has patient had a PCN reaction causing severe rash involving mucus membranes or skin necrosis: unknown Has patient had a PCN reaction that required hospitalization: unknown Has patient had a PCN reaction occurring within the last 10 years: unknown If all of the above answers are "NO", then may proceed with Cephalosporin use.     Antimicrobials this admission: Cipro  5/3 >>    >>     Thank you for allowing pharmacy to be a part of this patient's care.  Josephine Igoittman, Israel Werts Bennett 10/21/2016 6:37 PM

## 2016-10-21 NOTE — H&P (Signed)
History and Physical    [place] DesanctisScoville T Haddaway ZOX:[id number] DOB: Oct 05, 1945 DOA: 10/21/2016  PCP: Mahlon GammonAnjali L Gupta, MD  Patient coming from: [facility]  Chief Complaint: abdominal distention  HPI: [name] [name] is a 71 y.o. male with medical history significant of CVA with left sided hemiparesis, HTN, DVT that presents with abdominal distention.  Brother is bedside and does not know how long this distention has been going on.  Per son patient was recently diagnosed with pneumonia for which he was getting treatment at [facility].  Brother states this morning a nurse from [facility] called him and stated patient's pneumonia was worsening.    Brother does not know if patient had any fevers, chills, abdominal pain, shortness of breath.  Patient has a history of constipation for which his brother states he needs to be monitored for stool frequently.  Is on coumadin chronically for history of DVT.   ED Course: Laboratory values showing WBC of 16.4, BUN/Cr of 51/0.60, lactic acid of 1.20, UA showing positive nitrite, few bacteria, Urine Culture on 10/20/16 showing 60k colonies of GNR, blood culture pending.  CT renal study showing volvulus.  General surgery consulted as well gastroenterology.  Patient underwent decompression colonoscopy on 10/21/16.  INR of 2.9  Review of Systems: Unable to elicit ROS 2/2 drowsiness as patient is s/p colonoscopy and seen immediately following procedure.   Past Medical History:  Diagnosis Date  . Dysphasia   . Fatty liver   . Hemiplegia (HCC)   . Hypertension   . Pneumonia   . Pulmonary embolism (HCC)   . Stroke [facility](HCC)     Past Surgical History:  Procedure Laterality Date  . unable       reports that he has never smoked. He has never used smokeless tobacco. He reports that he does not drink alcohol or use drugs.  Allergies  Allergen Reactions  . Penicillins     Has patient had a PCN reaction causing immediate rash, facial/tongue/throat  swelling, SOB or lightheadedness with hypotension: unknown Has patient had a PCN reaction causing severe rash involving mucus membranes or skin necrosis: unknown Has patient had a PCN reaction that required hospitalization: unknown Has patient had a PCN reaction occurring within the last 10 years: unknown If all of the above answers are "NO", then may proceed with Cephalosporin use.     No family history on file.   Prior to Admission medications   Medication Sig Start Date End Date Taking? Authorizing Provider  acetaminophen (TYLENOL) 325 MG tablet Take 650 mg by mouth every 6 (six) hours as needed for moderate pain.    Yes Historical Provider, MD  betamethasone dipropionate (DIPROLENE) 0.05 % cream Apply 1 application topically 2 (two) times daily. Apply to legs and hands NOT TO FACE, GROIN,AXILLA TRY CERAVAE FIRST twice  day    Yes Historical Provider, MD  Dextromethorphan-Guaifenesin (ROBITUSSIN DM PO) Take 15 mLs by mouth every 6 (six) hours as needed (cough).    Yes Historical Provider, MD  Eyelid Cleansers (SYSTANE LID WIPES EX) Apply 1 application topically 2 (two) times daily. Start occusoft lid scrubs: clean eyelids of oil and debris BID OU ( each eye ) before administering eye drops.    Yes Historical Provider, MD  ipratropium (ATROVENT) 0.02 % nebulizer solution Take 0.5 mg by nebulization every 6 (six) hours.   Yes Historical Provider, MD  labetalol (NORMODYNE) 200 MG tablet Take 200 mg by mouth daily. For HTN   Yes Historical  Provider, MD  lactulose (CHRONULAC) 10 GM/15ML solution Give 15cc by mouth twice daily For constipation   Yes Historical Provider, MD  levofloxacin (LEVAQUIN) 500 MG tablet Take 500 mg by mouth daily.   Yes Historical Provider, MD  lisinopril (PRINIVIL,ZESTRIL) 20 MG tablet Take 20 mg by mouth daily.   Yes Historical Provider, MD  loratadine (CLARITIN) 10 MG tablet Take 10 mg by mouth daily.   Yes Historical Provider, MD  polyvinyl alcohol (LIQUIFILM TEARS)  1.4 % ophthalmic solution Place 1 drop into both eyes 2 (two) times daily.   Yes Historical Provider, MD  Pramoxine HCl (CERAVE ITCH RELIEF EX) Apply 1 application topically daily as needed (itching).   Yes Historical Provider, MD  predniSONE (DELTASONE) 20 MG tablet Take 40 mg by mouth daily with breakfast.   Yes Historical Provider, MD  sennosides-docusate sodium (SENOKOT-S) 8.6-50 MG tablet Take 1 tablet by mouth at bedtime. For constipation   Yes Historical Provider, MD  warfarin (COUMADIN) 2 MG tablet Take 2 mg by mouth daily at 6 PM. Give 2 mg by mouth once a day   Yes Historical Provider, MD  White Petrolatum-Mineral Oil (SYSTANE NIGHTTIME) OINT Place 1 application into the left eye at bedtime. Apply to left eye at night    Yes Historical Provider, MD    Physical Exam: Vitals:   10/21/16 1515 10/21/16 1517 10/21/16 1520 10/21/16 1525  BP: 115/71  107/65   Pulse: 98  96 94  Resp: (!) 21  (!) 24 (!) 24  Temp:      TempSrc:      SpO2: 94% 94% 94% 95%  Weight:      Height:          Constitutional: NAD, calm, comfortable Vitals:   10/21/16 1515 10/21/16 1517 10/21/16 1520 10/21/16 1525  BP: 115/71  107/65   Pulse: 98  96 94  Resp: (!) 21  (!) 24 (!) 24  Temp:      TempSrc:      SpO2: 94% 94% 94% 95%  Weight:      Height:       Eyes: PERRL, lids and conjunctivae normal ENMT: Mucous membranes are moist. Posterior pharynx clear of any exudate or lesions.Normal dentition.  Neck: normal, supple, no masses, no thyromegaly Respiratory: rales in the middle and lower left lung field  Cardiovascular: Regular rate and rhythm, no murmurs / rubs / gallops. No extremity edema. 2+ pedal pulses. No carotid bruits.  Abdomen: no tenderness, no masses palpated. No hepatosplenomegaly. Bowel sounds positive. Still slightly distended Musculoskeletal: contracture of the left upper extremity, paralysis of left upper and lower extremity Skin: no rashes, lesions, ulcers. No induration Neurologic:  left side facial droop.  Left side paralysis.  Speech is mumbled.Marland Kitchen  Psychiatric: unable to elicit 2/2 anesthesia     Labs on Admission: I have personally reviewed following labs and imaging studies  CBC:  Recent Labs Lab 10/19/16 1150 10/21/16 0700 10/21/16 0914  WBC 11.5* 14.5* 16.4*  NEUTROABS 9.7* 11.4* 13.0*  HGB 16.4 15.2 15.4  HCT 50.2 46.5 47.0  MCV 92.1 92.3 92.5  PLT 349 334 347   Basic Metabolic Panel:  Recent Labs Lab 10/19/16 1150 10/21/16 0914 10/21/16 1401  NA 140 141  --   K 4.3 3.7  --   CL 103 106  --   CO2 25 26  --   GLUCOSE 172* 163*  --   BUN 29* 51*  --   CREATININE 1.13 1.72* 0.60*  CALCIUM 9.6 9.1  --    GFR: Estimated Creatinine Clearance: 99 mL/min (A) (by C-G formula based on SCr of 0.6 mg/dL (L)). Liver Function Tests:  Recent Labs Lab 10/19/16 1150 10/21/16 0914  AST 29 38  ALT 39 45  ALKPHOS 74 59  BILITOT 0.7 0.7  PROT 9.0* 8.2*  ALBUMIN 4.0 3.5   No results for input(s): LIPASE, AMYLASE in the last 168 hours. No results for input(s): AMMONIA in the last 168 hours. Coagulation Profile:  Recent Labs Lab 10/15/16 1015 10/18/16 0800 10/19/16 1150 10/21/16 0700  INR 2.81 2.58 2.43 2.90   Cardiac Enzymes:  Recent Labs Lab 10/21/16 0914  TROPONINI 0.06*   BNP (last 3 results) No results for input(s): PROBNP in the last 8760 hours. HbA1C: No results for input(s): HGBA1C in the last 72 hours. CBG: No results for input(s): GLUCAP in the last 168 hours. Lipid Profile: No results for input(s): CHOL, HDL, LDLCALC, TRIG, CHOLHDL, LDLDIRECT in the last 72 hours. Thyroid Function Tests: No results for input(s): TSH, T4TOTAL, FREET4, T3FREE, THYROIDAB in the last 72 hours. Anemia Panel: No results for input(s): VITAMINB12, FOLATE, FERRITIN, TIBC, IRON, RETICCTPCT in the last 72 hours. Urine analysis:    Component Value Date/Time   COLORURINE YELLOW 10/20/2016 0230   APPEARANCEUR CLEAR 10/20/2016 0230   LABSPEC  1.025 10/20/2016 0230   PHURINE 5.5 10/20/2016 0230   GLUCOSEU NEGATIVE 10/20/2016 0230   HGBUR SMALL (A) 10/20/2016 0230   BILIRUBINUR NEGATIVE 10/20/2016 0230   KETONESUR TRACE (A) 10/20/2016 0230   PROTEINUR 100 (A) 10/20/2016 0230   NITRITE POSITIVE (A) 10/20/2016 0230   LEUKOCYTESUR NEGATIVE 10/20/2016 0230   Sepsis Labs: !!!!!!!!!!!!!!!!!!!!!!!!!!!!!!!!!!!!!!!!!!!! @LABRCNTIP (procalcitonin:4,lacticidven:4) ) Recent Results (from the past 240 hour(s))  Culture, Urine     Status: Abnormal (Preliminary result)   Collection Time: 10/20/16  2:30 AM  Result Value Ref Range Status   Specimen Description URINE, CLEAN CATCH  Final   Special Requests NONE  Final   Culture 60,000 COLONIES/mL CITROBACTER KOSERI (A)  Final   Report Status PENDING  Incomplete  Blood Culture (routine x 2)     Status: None (Preliminary result)   Collection Time: 10/21/16  9:14 AM  Result Value Ref Range Status   Specimen Description BLOOD RIGHT HAND  Final   Special Requests   Final    BOTTLES DRAWN AEROBIC AND ANAEROBIC Blood Culture adequate volume   Culture PENDING  Incomplete   Report Status PENDING  Incomplete  Blood Culture (routine x 2)     Status: None (Preliminary result)   Collection Time: 10/21/16  9:27 AM  Result Value Ref Range Status   Specimen Description BLOOD RIGHT FOREARM  Final   Special Requests   Final    BOTTLES DRAWN AEROBIC AND ANAEROBIC Blood Culture adequate volume   Culture PENDING  Incomplete   Report Status PENDING  Incomplete     Radiological Exams on Admission: Dg Chest Port 1 View  Result Date: 10/21/2016 CLINICAL DATA:  Shortness of breath and fevers EXAM: PORTABLE CHEST 1 VIEW COMPARISON:  03/20/2015 FINDINGS: Cardiac shadow is mildly enlarged but stable. Overall poor inspiratory effort is noted. Some basilar atelectasis is seen likely related to the poor inspiratory effort. No sizable effusion is seen. No bony abnormality is noted. IMPRESSION: Poor inspiratory effort  with basilar atelectasis. Electronically Signed   By: Alcide Clever M.D.   On: 10/21/2016 09:47   Ct Renal Stone Study  Result Date: 10/21/2016 CLINICAL DATA:  Abdominal distention  and shortness of Breath EXAM: CT ABDOMEN AND PELVIS WITHOUT CONTRAST TECHNIQUE: Multidetector CT imaging of the abdomen and pelvis was performed following the standard protocol without IV contrast. COMPARISON:  12/28/2007 FINDINGS: Lower chest: Bibasilar consolidation is noted right greater than left. Hepatobiliary: With the exception of a small cyst in the right lobe of the liver the liver and gallbladder are within normal limits. Pancreas: Unremarkable. No pancreatic ductal dilatation or surrounding inflammatory changes. Spleen: Normal in size without focal abnormality. Adrenals/Urinary Tract: The adrenal glands are within normal limits. Renal vascular calcifications are noted. Mild fullness of the left collecting system is seen which may be related to edema from recently passed stone as no definitive stone is identified. The bladder is well distended. Stomach/Bowel: The colon is distended with air and fecal material. There is an area of focal narrowing in the sigmoid colon best seen on image number 54 of series 2 and image 53 of series 7. There are changes consistent with a mobile sigmoid and early sigmoid volvulus. The colon proximal to the area of narrowing in the sigmoid is significantly dilated. On the coronal imaging significant rotation of the sigmoid and its associated vascularity is noted. The appendix is within normal limits. No other bowel abnormality is seen. Vascular/Lymphatic: Aortic atherosclerosis. No enlarged abdominal or pelvic lymph nodes. Reproductive: Prostate is unremarkable. Other: Small fat containing umbilical hernia is noted. Musculoskeletal: Degenerative changes of lumbar spine are seen. No acute bony abnormality is noted. IMPRESSION: Changes consistent with sigmoid volume ileus and proximal colonic  obstructive change. The distal most: Beyond the volvulus is decompressed. Surgical consultation is recommended. Mild fullness of the left renal collecting system without definitive stone. These results were called by telephone at the time of interpretation on 10/21/2016 at 11:32 am to Dr. Marily Memos , who verbally acknowledged these results. Electronically Signed   By: Alcide Clever M.D.   On: 10/21/2016 11:33    EKG: Independently reviewed. Sinus tachycardia  Assessment/Plan Active Problems:   HTN (hypertension)   DVT (deep venous thrombosis) (HCC)   Constipation   COPD (chronic obstructive pulmonary disease) (HCC)   Volvulus (HCC)     Volvulus - Gen Surg and Gastroenterology consulted - colonoscope decompression - per Dr. Karilyn Cota starting miralax and senna - can start soft diet with nectar thick liquids - monitor for recurrence of volvulus  HTN - can restart home medications in am pending BP  DVT - pharmacy to dose coumadin - monitor INR - may need vitamin K if patient needs reversal for surgery  COPD - unclear what baseline is - currently on supplemental oxygen for previously diagnosed pneumonia  UTI - will start cipro for UTI - follow up on culture and sensitivites  Elevated troponin - repeat troponin pending - troponins x 3 - ekg in am  AKI - likely related to UTI and volvulus as well as poor PO intake - repeat BMP in am - continue with IVF at 68ml/hr  H/o CVA and hemiplegia - PT/OT/SLP ordered     DVT prophylaxis:  coumadin  Code Status: Full code  Family Communication: Brother bedside  Disposition Plan: likely discharge back to [facility] when improved  Consults called: Gastroenterology, Gen Surg  Admission status: Admit to telemetry, inpatient    Katrinka Blazing MD [facility] Pager [phone]  If 7PM-7AM, please contact night-coverage [email or web address] Password [email or web address]  10/21/2016, 4:01 PM

## 2016-10-21 NOTE — Progress Notes (Signed)
Brief flexible sigmoidoscopy note.  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decompressed.   Can begin oral feeding/ Use polyethylene glycol in place of lactulose.

## 2016-10-21 NOTE — ED Provider Notes (Addendum)
[facility]-EMERG[facility]Y DEPT Provider Note   CSN: [id number] Arrival date & time: 10/21/16  4098  By signing my name below, I, Majel Homer, attest that this documentation has been prepared under the direction and in the presence of Marily Memos, MD . Electronically Signed: Majel Homer, Scribe. 10/21/2016. 2:55 PM.  History   Chief Complaint Chief Complaint  Patient presents with  . Shortness of Breath  . Code Sepsis   The history is provided by the patient, the EMS personnel and the nursing home. No language interpreter was used.   HPI Comments: [name] [name] is a 71 y.o. male with PMHx of HTN, pneumonia, PE, and stroke in 2002, brought in by EMS to the Emergency Department from [facility] for an evaluation of gradually worsening, shortness of breath that began ~1 week ago and worsened this morning. Per EMS, pt was seen by his PCP on 10/19/16 and was prescribed Levaquin for a URI. Pt was evaluated by nursing staff this morning and was noted to be increasingly short of breath with associated diaphoresis, congestion and tactile fever (TMAX 101.2 rectally in the ED). Pt also reports associated abdominal distension. He states he does not normally use any home O2 at baseline. He denies any abdominal pain, nausea, vomiting or leg swelling.   Past Medical History:  Diagnosis Date  . Dysphasia   . Fatty liver   . Hemiplegia (HCC)   . Hypertension   . Pneumonia   . Pulmonary embolism (HCC)   . Stroke [facility])     Patient Active Problem List   Diagnosis Date Noted  . Volvulus (HCC) 10/21/2016  . COPD (chronic obstructive pulmonary disease) (HCC) 10/19/2016  . Arm weakness 10/07/2015  . Skin lesion 10/07/2015  . History of bronchitis 10/05/2015  . Cough 03/21/2015  . Acute bronchitis 03/21/2015  . Edema 03/07/2015  . History of stroke 09/10/2014  . Stroke/cerebrovascular accident (HCC) 03/20/2014  . Cellulitis 04/10/2013  . Long term current use of anticoagulant therapy 10/21/2012  .  Cerebral infarction (HCC) 09/26/2012  . HTN (hypertension) 09/26/2012  . DVT (deep venous thrombosis) (HCC) 09/26/2012  . Chronic pulmonary embolism (HCC) 09/26/2012  . Pneumonia 09/26/2012  . Constipation 09/26/2012  . IMPINGEMENT SYNDROME 02/14/2008    Past Surgical History:  Procedure Laterality Date  . unable      Home Medications    Prior to Admission medications   Medication Sig Start Date End Date Taking? Authorizing Provider  acetaminophen (TYLENOL) 325 MG tablet Take 650 mg by mouth every 6 (six) hours as needed for moderate pain.    Yes Historical Provider, MD  betamethasone dipropionate (DIPROLENE) 0.05 % cream Apply 1 application topically 2 (two) times daily. Apply to legs and hands NOT TO FACE, GROIN,AXILLA TRY CERAVAE FIRST twice  day    Yes Historical Provider, MD  Dextromethorphan-Guaifenesin (ROBITUSSIN DM PO) Take 15 mLs by mouth every 6 (six) hours as needed (cough).    Yes Historical Provider, MD  Eyelid Cleansers (SYSTANE LID WIPES EX) Apply 1 application topically 2 (two) times daily. Start occusoft lid scrubs: clean eyelids of oil and debris BID OU ( each eye ) before administering eye drops.    Yes Historical Provider, MD  ipratropium (ATROVENT) 0.02 % nebulizer solution Take 0.5 mg by nebulization every 6 (six) hours.   Yes Historical Provider, MD  labetalol (NORMODYNE) 200 MG tablet Take 200 mg by mouth daily. For HTN   Yes Historical Provider, MD  lactulose (CHRONULAC) 10 GM/15ML solution Give 15cc  by mouth twice daily For constipation   Yes Historical Provider, MD  levofloxacin (LEVAQUIN) 500 MG tablet Take 500 mg by mouth daily.   Yes Historical Provider, MD  lisinopril (PRINIVIL,ZESTRIL) 20 MG tablet Take 20 mg by mouth daily.   Yes Historical Provider, MD  loratadine (CLARITIN) 10 MG tablet Take 10 mg by mouth daily.   Yes Historical Provider, MD  polyvinyl alcohol (LIQUIFILM TEARS) 1.4 % ophthalmic solution Place 1 drop into both eyes 2 (two) times daily.    Yes Historical Provider, MD  Pramoxine HCl (CERAVE ITCH RELIEF EX) Apply 1 application topically daily as needed (itching).   Yes Historical Provider, MD  predniSONE (DELTASONE) 20 MG tablet Take 40 mg by mouth daily with breakfast.   Yes Historical Provider, MD  sennosides-docusate sodium (SENOKOT-S) 8.6-50 MG tablet Take 1 tablet by mouth at bedtime. For constipation   Yes Historical Provider, MD  warfarin (COUMADIN) 2 MG tablet Take 2 mg by mouth daily at 6 PM. Give 2 mg by mouth once a day   Yes Historical Provider, MD  White Petrolatum-Mineral Oil (SYSTANE NIGHTTIME) OINT Place 1 application into the left eye at bedtime. Apply to left eye at night    Yes Historical Provider, MD    Family History No family history on file.  Social History Social History  Substance Use Topics  . Smoking status: Never Smoker  . Smokeless tobacco: Never Used  . Alcohol use No     Allergies   Penicillins   Review of Systems Review of Systems  Constitutional: Positive for diaphoresis and fever.  HENT: Positive for congestion.   Respiratory: Positive for shortness of breath.   Cardiovascular: Negative for leg swelling.  Gastrointestinal: Positive for abdominal distention. Negative for abdominal pain, nausea and vomiting.  All other systems reviewed and are negative.  Physical Exam Updated Vital Signs BP (!) 130/102 (BP Location: Left Arm)   Pulse (!) 136   Temp (!) 101.2 F (38.4 C) (Rectal)   Resp (!) 28   Ht 5\' 10"  (1.778 m)   Wt 208 lb (94.3 kg)   SpO2 100%   BMI 29.84 kg/m   Physical Exam  Constitutional: He is oriented to person, place, and time. He appears well-developed and well-nourished.  HENT:  Head: Normocephalic and atraumatic.  Eyes: EOM are normal.  Neck: Normal range of motion.  Cardiovascular: Normal heart sounds and intact distal pulses.  Tachycardia present.   Tachycardic  Pulmonary/Chest:  Rhonchorous breath sounds, left bigger than right. Tachypnea.     Abdominal: Soft. He exhibits distension. There is no tenderness.  Abdomen distended but non-tender.   Musculoskeletal: Normal range of motion.  Neurological: He is alert and oriented to person, place, and time.  Right sided residual weakness   Skin: Skin is warm and dry.  Skin is warm to the touch  Psychiatric: He has a normal mood and affect. Judgment normal.  Nursing note and vitals reviewed.  ED Treatments / Results  DIAGNOSTIC STUDIES:  Oxygen Saturation is 100% on N/C, normal by my interpretation.    COORDINATION OF CARE:  9:10 AM Discussed treatment plan with pt at bedside and pt agreed to plan.  Labs (all labs ordered are listed, but only abnormal results are displayed) Labs Reviewed  COMPREHENSIVE METABOLIC PANEL - Abnormal; Notable for the following:       Result Value   Glucose, Bld 163 (*)    BUN 51 (*)    Creatinine, Ser 1.72 (*)  Total Protein 8.2 (*)    GFR calc non Af Amer 39 (*)    GFR calc Af Amer 45 (*)    All other components within normal limits  CBC WITH DIFFERENTIAL/PLATELET - Abnormal; Notable for the following:    WBC 16.4 (*)    Neutro Abs 13.0 (*)    Monocytes Absolute 1.4 (*)    All other components within normal limits  TROPONIN I - Abnormal; Notable for the following:    Troponin I 0.06 (*)    All other components within normal limits  I-STAT CREATININE, ED - Abnormal; Notable for the following:    Creatinine, Ser 0.60 (*)    All other components within normal limits  CULTURE, BLOOD (ROUTINE X 2)  CULTURE, BLOOD (ROUTINE X 2)  BRAIN NATRIURETIC PEPTIDE  URINALYSIS, ROUTINE W REFLEX MICROSCOPIC  I-STAT CG4 LACTIC ACID, ED  I-STAT CG4 LACTIC ACID, ED    EKG  EKG Interpretation None       Radiology Dg Chest Port 1 View  Result Date: 10/21/2016 CLINICAL DATA:  Shortness of breath and fevers EXAM: PORTABLE CHEST 1 VIEW COMPARISON:  03/20/2015 FINDINGS: Cardiac shadow is mildly enlarged but stable. Overall poor inspiratory effort  is noted. Some basilar atelectasis is seen likely related to the poor inspiratory effort. No sizable effusion is seen. No bony abnormality is noted. IMPRESSION: Poor inspiratory effort with basilar atelectasis. Electronically Signed   By: Alcide CleverMark  Lukens M.D.   On: 10/21/2016 09:47   Ct Renal Stone Study  Result Date: 10/21/2016 CLINICAL DATA:  Abdominal distention and shortness of Breath EXAM: CT ABDOMEN AND PELVIS WITHOUT CONTRAST TECHNIQUE: Multidetector CT imaging of the abdomen and pelvis was performed following the standard protocol without IV contrast. COMPARISON:  12/28/2007 FINDINGS: Lower chest: Bibasilar consolidation is noted right greater than left. Hepatobiliary: With the exception of a small cyst in the right lobe of the liver the liver and gallbladder are within normal limits. Pancreas: Unremarkable. No pancreatic ductal dilatation or surrounding inflammatory changes. Spleen: Normal in size without focal abnormality. Adrenals/Urinary Tract: The adrenal glands are within normal limits. Renal vascular calcifications are noted. Mild fullness of the left collecting system is seen which may be related to edema from recently passed stone as no definitive stone is identified. The bladder is well distended. Stomach/Bowel: The colon is distended with air and fecal material. There is an area of focal narrowing in the sigmoid colon best seen on image number 54 of series 2 and image 53 of series 7. There are changes consistent with a mobile sigmoid and early sigmoid volvulus. The colon proximal to the area of narrowing in the sigmoid is significantly dilated. On the coronal imaging significant rotation of the sigmoid and its associated vascularity is noted. The appendix is within normal limits. No other bowel abnormality is seen. Vascular/Lymphatic: Aortic atherosclerosis. No enlarged abdominal or pelvic lymph nodes. Reproductive: Prostate is unremarkable. Other: Small fat containing umbilical hernia is noted.  Musculoskeletal: Degenerative changes of lumbar spine are seen. No acute bony abnormality is noted. IMPRESSION: Changes consistent with sigmoid volume ileus and proximal colonic obstructive change. The distal most: Beyond the volvulus is decompressed. Surgical consultation is recommended. Mild fullness of the left renal collecting system without definitive stone. These results were called by telephone at the time of interpretation on 10/21/2016 at 11:32 am to Dr. Marily MemosJASON Toddy Boyd , who verbally acknowledged these results. Electronically Signed   By: Alcide CleverMark  Lukens M.D.   On: 10/21/2016 11:33  Procedures Procedures (including critical care time)  CRITICAL CARE Performed by: Marily Memos Total critical care time: 32 minutes Critical care time was exclusive of separately billable procedures and treating other patients. Critical care was necessary to treat or prevent imminent or life-threatening deterioration. Critical care was time spent personally by me on the following activities: development of treatment plan with patient and/or surrogate as well as nursing, discussions with consultants, evaluation of patient's response to treatment, examination of patient, obtaining history from patient or surrogate, ordering and performing treatments and interventions, ordering and review of laboratory studies, ordering and review of radiographic studies, pulse oximetry and re-evaluation of patient's condition.   Medications Ordered in ED Medications  ipratropium-albuterol (DUONEB) 0.5-2.5 (3) MG/3ML nebulizer solution 3 mL ( Nebulization Automatically Held 10/29/16 2000)  0.9 %  sodium chloride infusion (not administered)  vancomycin (VANCOCIN) IVPB 1000 mg/200 mL premix (0 mg Intravenous Stopped 10/21/16 1043)  ceFEPIme (MAXIPIME) 2 g in dextrose 5 % 50 mL IVPB (0 g Intravenous Stopped 10/21/16 1019)  acetaminophen (TYLENOL) suppository 650 mg (650 mg Rectal Given 10/21/16 0932)  sodium chloride 0.9 % bolus 2,000 mL (0  mLs Intravenous Stopped 10/21/16 1032)  sodium chloride 0.9 % bolus 1,000 mL (0 mLs Intravenous Stopped 10/21/16 1347)    Initial Impression / Assessment and Plan / ED Course  I have reviewed the triage vital signs and the nursing notes.  Pertinent labs & imaging results that were available during my care of the patient were reviewed by me and considered in my medical decision making (see chart for details).     71 year old male here for sepsis while on antibiotics secondary to presumed pneumonia as an outpatient. Hass history of stroke. He had last BM yesterday but he is found to have sigmoid volvulus here. Discussed case with Dr. Lovell Sheehan with surgery who recommends gastroenterology consultation and attempted decompression prior to any surgical intervention. I discussed with Dr. Karilyn Cota who will take to colonoscopy later today. I will discuss with the hospitalist for admission. Troponin elevated but suspect it is more from demand second to his significant dehydration/AKI/tachycardia. Doubt ACS.  HR improved with fluids and antibiotics. Patient appears well on reexam.  I personally performed the services described in this documentation, which was scribed in my presence. The recorded information has been reviewed and is accurate.    Final Clinical Impressions(s) / ED Diagnoses   Final diagnoses:  Sepsis, due to unspecified organism (HCC)  Volvulus (HCC)  Elevated troponin     Marily Memos, MD 10/21/16 1455    Marily Memos, MD 10/21/16 1456

## 2016-10-21 NOTE — ED Triage Notes (Signed)
PT sent by EMS from [facility] for SOB and fever. Nursing at SNF reports pt was started on Levaquin this week for URI. PT hx of CVA and is [facility]. PT had an Atrovent nebulizer this am with no relief.

## 2016-10-22 ENCOUNTER — Encounter (HOSPITAL_COMMUNITY): Payer: Self-pay | Admitting: Internal Medicine

## 2016-10-22 DIAGNOSIS — K5900 Constipation, unspecified: Secondary | ICD-10-CM

## 2016-10-22 DIAGNOSIS — A419 Sepsis, unspecified organism: Secondary | ICD-10-CM

## 2016-10-22 LAB — BASIC METABOLIC PANEL
Anion gap: 6 (ref 5–15)
BUN: 35 mg/dL — AB (ref 6–20)
CO2: 26 mmol/L (ref 22–32)
Calcium: 8.4 mg/dL — ABNORMAL LOW (ref 8.9–10.3)
Chloride: 112 mmol/L — ABNORMAL HIGH (ref 101–111)
Creatinine, Ser: 1.09 mg/dL (ref 0.61–1.24)
GFR calc Af Amer: >60 mL/min (ref >60)
GLUCOSE: 97 mg/dL (ref 65–99)
POTASSIUM: 4.1 mmol/L (ref 3.5–5.1)
Sodium: 144 mmol/L (ref 135–145)

## 2016-10-22 LAB — CBC
HCT: 40.7 % (ref 39.0–52.0)
Hemoglobin: 13.1 g/dL (ref 13.0–17.0)
MCH: 29.9 pg (ref 26.0–34.0)
MCHC: 32.2 g/dL (ref 30.0–36.0)
MCV: 92.9 fL (ref 78.0–100.0)
PLATELETS: 254 10*3/uL (ref 150–400)
RBC: 4.38 MIL/uL (ref 4.22–5.81)
RDW: 13.7 % (ref 11.5–15.5)
WBC: 10.6 10*3/uL — ABNORMAL HIGH (ref 4.0–10.5)

## 2016-10-22 LAB — URINE CULTURE

## 2016-10-22 LAB — TROPONIN I
TROPONIN I: 0.03 ng/mL — AB (ref <0.03)
TROPONIN I: 0.05 ng/mL — AB (ref <0.03)

## 2016-10-22 LAB — PROTIME-INR
INR: 4.28
Prothrombin Time: 41.9 seconds — ABNORMAL HIGH (ref 11.4–15.2)

## 2016-10-22 MED ORDER — SODIUM CHLORIDE 0.9 % IV SOLN: 250.0000 mL | INTRAVENOUS | Status: DC | PRN

## 2016-10-22 MED ORDER — SODIUM CHLORIDE 0.9 % IV SOLN
1.0000 g | Freq: Three times a day (TID) | INTRAVENOUS | Status: DC
  Administered 2016-10-22 – 2016-10-23 (×3): 1 g via INTRAVENOUS
  Filled 2016-10-22 (×10): qty 1

## 2016-10-22 MED ORDER — RESOURCE THICKENUP CLEAR PO POWD
ORAL | Status: DC | PRN
  Administered 2016-10-22: 18:00:00 via ORAL
  Filled 2016-10-22: qty 125

## 2016-10-22 MED ORDER — SODIUM CHLORIDE 0.9% FLUSH: 3.0000 mL | INTRAVENOUS | Status: DC | PRN

## 2016-10-22 MED ORDER — FLEET ENEMA 7-19 GM/118ML RE ENEM
1.0000 | ENEMA | Freq: Once | RECTAL | Status: AC
  Administered 2016-10-22: 1 via RECTAL

## 2016-10-22 MED ORDER — SODIUM CHLORIDE 0.9% FLUSH
3.0000 mL | Freq: Two times a day (BID) | INTRAVENOUS | Status: DC
  Administered 2016-10-22 – 2016-10-23 (×3): 3 mL via INTRAVENOUS

## 2016-10-22 MED ORDER — POLYETHYLENE GLYCOL 3350 17 G PO PACK
17.0000 g | PACK | Freq: Every day | ORAL | Status: DC
  Administered 2016-10-22 – 2016-10-23 (×2): 17 g via ORAL
  Filled 2016-10-22 (×2): qty 1

## 2016-10-22 NOTE — Progress Notes (Signed)
PROGRESS NOTE    [name] [name]  FAO:[id number] DOB: 1946/02/08 DOA: 10/21/2016 PCP: Mahlon Gammon, MD    Brief Narrative:  [name] [name] is a 71 y.o. male with medical history significant of CVA with left sided hemiparesis, HTN, DVT that presents with abdominal distention.  Brother is bedside and does not know how long this distention has been going on.  Per son patient was recently diagnosed with pneumonia for which he was getting treatment at [facility].  Brother states this morning a nurse from [facility] called him and stated patient's pneumonia was worsening.    Brother does not know if patient had any fevers, chills, abdominal pain, shortness of breath.  Patient has a history of constipation for which his brother states he needs to be monitored for stool frequently.  Is on coumadin chronically for history of DVT.    Assessment & Plan:   Active Problems:   HTN (hypertension)   DVT (deep venous thrombosis) (HCC)   Constipation   COPD (chronic obstructive pulmonary disease) (HCC)   Volvulus (HCC)   Volvulus - Gen Surg and Gastroenterology consulted - colonoscope decompression yesterday - continue miralax and senna - can start soft diet with nectar thick liquids - will need to discontinue lactulose permanently  HTN - can restart home medications in am pending BP  DVT - pharmacy to dose coumadin - INR of 4.28 today - will need to consider vitamin K if patient requires surgical intervention  COPD - unclear what baseline is in terms of supplemental oxygen - currently on supplemental oxygen for previously diagnosed pneumonia  UTI - resistant to cipro - will place on merropenem  Elevated troponin - decreased - likely related to tachycardia and acute volvulus  AKI - Cr improved today to 1.09 - repeat BMP in am  H/o CVA and hemiplegia - PT/OT/SLP ordered - may need to consider barium swallow outpatient    DVT prophylaxis:  coumadin    Code Status: Full code  Family Communication: Brother bedside  Disposition Plan: likely discharge back to [facility] when improved    Consultants:   Gastroenterology  Gen Surg  Procedures:   Colonoscopy with decompression  Antimicrobials:   Ciprofloxacin 5/3>5/4  Meropenem 5/4>    Subjective: Patient is awake.  Abdomen is nontender.  Patient asking to eat.  Patient brother stating he is concerned patient is aspirating secondary to eating too quickly.  Objective: Vitals:   10/21/16 2143 10/22/16 0654 10/22/16 0811 10/22/16 1335  BP: 123/74 134/72    Pulse: 97 89    Resp: 18 18    Temp: 97.9 F (36.6 C) 98.1 F (36.7 C)    TempSrc: Oral Oral    SpO2: 96% 96% 96% 95%  Weight:      Height:        Intake/Output Summary (Last 24 hours) at 10/22/16 1624 Last data filed at 10/22/16 1334  Gross per 24 hour  Intake              723 ml  Output                0 ml  Net              723 ml   Filed Weights   10/21/16 0909  Weight: 94.3 kg (208 lb)    Examination:  General exam: Appears calm and comfortable  Respiratory system: Respiratory effort normal. Bibasilar crackles Cardiovascular system: S1 & S2 heard, RRR. No JVD,  murmurs, rubs, gallops or clicks. No pedal edema. Gastrointestinal system: Abdomen is slightly distended, soft and nontender. No organomegaly or masses felt. Normal bowel sounds heard. Central nervous system: Alert. Left side facial droop (chronic) Extremities: contracture of the right upper extremity at elbow Skin: No rashes, lesions or ulcers Psychiatry:  Mood & affect appropriate. Questionable insight    Data Reviewed: I have personally reviewed following labs and imaging studies  CBC:  Recent Labs Lab 10/19/16 1150 10/21/16 0700 10/21/16 0914 10/22/16 0612  WBC 11.5* 14.5* 16.4* 10.6*  NEUTROABS 9.7* 11.4* 13.0*  --   HGB 16.4 15.2 15.4 13.1  HCT 50.2 46.5 47.0 40.7  MCV 92.1 92.3 92.5 92.9  PLT 349 334 347 254   Basic  Metabolic Panel:  Recent Labs Lab 10/19/16 1150 10/21/16 0914 10/21/16 1401 10/22/16 0612  NA 140 141  --  144  K 4.3 3.7  --  4.1  CL 103 106  --  112*  CO2 25 26  --  26  GLUCOSE 172* 163*  --  97  BUN 29* 51*  --  35*  CREATININE 1.13 1.72* 0.60* 1.09  CALCIUM 9.6 9.1  --  8.4*   GFR: Estimated Creatinine Clearance: 72.7 mL/min (by C-G formula based on SCr of 1.09 mg/dL). Liver Function Tests:  Recent Labs Lab 10/19/16 1150 10/21/16 0914  AST 29 38  ALT 39 45  ALKPHOS 74 59  BILITOT 0.7 0.7  PROT 9.0* 8.2*  ALBUMIN 4.0 3.5   No results for input(s): LIPASE, AMYLASE in the last 168 hours. No results for input(s): AMMONIA in the last 168 hours. Coagulation Profile:  Recent Labs Lab 10/18/16 0800 10/19/16 1150 10/21/16 0700 10/22/16 0612  INR 2.58 2.43 2.90 4.28*   Cardiac Enzymes:  Recent Labs Lab 10/21/16 0914 10/21/16 1940 10/22/16 0015 10/22/16 0612  TROPONINI 0.06* 0.05* 0.05* 0.03*   BNP (last 3 results) No results for input(s): PROBNP in the last 8760 hours. HbA1C: No results for input(s): HGBA1C in the last 72 hours. CBG: No results for input(s): GLUCAP in the last 168 hours. Lipid Profile: No results for input(s): CHOL, HDL, LDLCALC, TRIG, CHOLHDL, LDLDIRECT in the last 72 hours. Thyroid Function Tests: No results for input(s): TSH, T4TOTAL, FREET4, T3FREE, THYROIDAB in the last 72 hours. Anemia Panel: No results for input(s): VITAMINB12, FOLATE, FERRITIN, TIBC, IRON, RETICCTPCT in the last 72 hours. Sepsis Labs:  Recent Labs Lab 10/21/16 1610 10/21/16 1236  LATICACIDVEN 1.51 1.20    Recent Results (from the past 240 hour(s))  Culture, Urine     Status: Abnormal   Collection Time: 10/20/16  2:30 AM  Result Value Ref Range Status   Specimen Description URINE, CLEAN CATCH  Final   Special Requests NONE  Final   Culture 60,000 COLONIES/mL CITROBACTER KOSERI (A)  Final   Report Status 10/22/2016 FINAL  Final   Organism ID,  Bacteria CITROBACTER KOSERI (A)  Final      Susceptibility   Citrobacter koseri - MIC*    CEFAZOLIN >=64 RESISTANT Resistant     CEFTRIAXONE 32 INTERMEDIATE Intermediate     CIPROFLOXACIN >=4 RESISTANT Resistant     GENTAMICIN <=1 SENSITIVE Sensitive     IMIPENEM 1 SENSITIVE Sensitive     NITROFURANTOIN 64 INTERMEDIATE Intermediate     TRIMETH/SULFA <=20 SENSITIVE Sensitive     PIP/TAZO 32 INTERMEDIATE Intermediate     * 60,000 COLONIES/mL CITROBACTER KOSERI  Blood Culture (routine x 2)     Status: None (Preliminary result)  Collection Time: 10/21/16  9:14 AM  Result Value Ref Range Status   Specimen Description BLOOD RIGHT HAND  Final   Special Requests   Final    BOTTLES DRAWN AEROBIC AND ANAEROBIC Blood Culture adequate volume   Culture NO GROWTH < 24 HOURS  Final   Report Status PENDING  Incomplete  Blood Culture (routine x 2)     Status: None (Preliminary result)   Collection Time: 10/21/16  9:27 AM  Result Value Ref Range Status   Specimen Description BLOOD RIGHT FOREARM  Final   Special Requests   Final    BOTTLES DRAWN AEROBIC AND ANAEROBIC Blood Culture adequate volume   Culture NO GROWTH < 24 HOURS  Final   Report Status PENDING  Incomplete  MRSA PCR Screening     Status: None   Collection Time: 10/21/16  4:43 PM  Result Value Ref Range Status   MRSA by PCR NEGATIVE NEGATIVE Final    Comment:        The GeneXpert MRSA Assay (FDA approved for NASAL specimens only), is one component of a comprehensive MRSA colonization surveillance program. It is not intended to diagnose MRSA infection nor to guide or monitor treatment for MRSA infections.          Radiology Studies: Dg Chest Port 1 View  Result Date: 10/21/2016 CLINICAL DATA:  Shortness of breath and fevers EXAM: PORTABLE CHEST 1 VIEW COMPARISON:  03/20/2015 FINDINGS: Cardiac shadow is mildly enlarged but stable. Overall poor inspiratory effort is noted. Some basilar atelectasis is seen likely related to  the poor inspiratory effort. No sizable effusion is seen. No bony abnormality is noted. IMPRESSION: Poor inspiratory effort with basilar atelectasis. Electronically Signed   By: Alcide CleverMark  Lukens M.D.   On: 10/21/2016 09:47   Ct Renal Stone Study  Result Date: 10/21/2016 CLINICAL DATA:  Abdominal distention and shortness of Breath EXAM: CT ABDOMEN AND PELVIS WITHOUT CONTRAST TECHNIQUE: Multidetector CT imaging of the abdomen and pelvis was performed following the standard protocol without IV contrast. COMPARISON:  12/28/2007 FINDINGS: Lower chest: Bibasilar consolidation is noted right greater than left. Hepatobiliary: With the exception of a small cyst in the right lobe of the liver the liver and gallbladder are within normal limits. Pancreas: Unremarkable. No pancreatic ductal dilatation or surrounding inflammatory changes. Spleen: Normal in size without focal abnormality. Adrenals/Urinary Tract: The adrenal glands are within normal limits. Renal vascular calcifications are noted. Mild fullness of the left collecting system is seen which may be related to edema from recently passed stone as no definitive stone is identified. The bladder is well distended. Stomach/Bowel: The colon is distended with air and fecal material. There is an area of focal narrowing in the sigmoid colon best seen on image number 54 of series 2 and image 53 of series 7. There are changes consistent with a mobile sigmoid and early sigmoid volvulus. The colon proximal to the area of narrowing in the sigmoid is significantly dilated. On the coronal imaging significant rotation of the sigmoid and its associated vascularity is noted. The appendix is within normal limits. No other bowel abnormality is seen. Vascular/Lymphatic: Aortic atherosclerosis. No enlarged abdominal or pelvic lymph nodes. Reproductive: Prostate is unremarkable. Other: Small fat containing umbilical hernia is noted. Musculoskeletal: Degenerative changes of lumbar spine are  seen. No acute bony abnormality is noted. IMPRESSION: Changes consistent with sigmoid volume ileus and proximal colonic obstructive change. The distal most: Beyond the volvulus is decompressed. Surgical consultation is recommended. Mild fullness of the  left renal collecting system without definitive stone. These results were called by telephone at the time of interpretation on 10/21/2016 at 11:32 am to Dr. Marily Memos , who verbally acknowledged these results. Electronically Signed   By: Alcide Clever M.D.   On: 10/21/2016 11:33        Scheduled Meds: . ipratropium-albuterol  3 mL Nebulization TID  . labetalol  200 mg Oral Daily  . lisinopril  20 mg Oral Daily  . sodium chloride flush  3 mL Intravenous Q12H  . Warfarin - Pharmacist Dosing Inpatient   Does not apply q1800   Continuous Infusions: . sodium chloride    . meropenem (MERREM) IV 1 g (10/22/16 1334)     LOS: 1 day    Time spent: 30 minutes    Katrinka Blazing, MD [facility] Pager [phone]  If 7PM-7AM, please contact night-coverage [email or web address] Password [email or web address] 10/22/2016, 4:24 PM

## 2016-10-22 NOTE — Evaluation (Signed)
Clinical/Bedside Swallow Evaluation Patient Details  Name: [name] [name] MRN: [id number] Date of Birth: Nov 26, 1945  Today's Date: 10/22/2016 Time: SLP Start Time (ACUTE ONLY): 1540 SLP Stop Time (ACUTE ONLY): 1605 SLP Time Calculation (min) (ACUTE ONLY): 25 min  Past Medical History:  Past Medical History:  Diagnosis Date  . Dysphasia   . Fatty liver   . Hemiplegia (HCC)   . Hypertension   . Pneumonia   . Pulmonary embolism (HCC)   . Stroke [facility](HCC)    Past Surgical History:  Past Surgical History:  Procedure Laterality Date  . FLEXIBLE SIGMOIDOSCOPY N/A 10/21/2016   Procedure: FLEXIBLE SIGMOIDOSCOPY;  Surgeon: Malissa Hippoehman, Najeeb U, MD;  Location: [facility];  Service: Endoscopy;  Laterality: N/A;  . unable     HPI:  Pt is a 71 y.o. male with PMH of CVA with L side hemiparesis, HTN, DVT who presented to ED on 5/3 with abdominal distention. Pt was recently diagnosed with PNA and was being treated at [facility]. CXR showed poor inspiratory effort with basilar atelectassis. Pt currently on a soft diet with honey-thick liquids; hx of dysphagia is noted but no previous SLP visits in chart. Bedside swallow eval and cog-linguistic evals ordered.  Pt [facility] He recently advanced to Dysphagia 2 diet and honey thick liquids. They have worked on the following compensatory strategies: slow rate, small bites, alternate bites/ sips. She recommended full supervision due to impulsivity with meals.   Assessment / Plan / Recommendation Clinical Impression  Pt demonstrated overt s/s of aspiration, primarily during initial trials of honey thick liquids by cup. Noted that pt was somewhat impulsive with taking fast sips, followed by prolonged, productive cough. Just prior to beginning eval, pt reportedly had a choking episode with lunch tray (chopped meat). **Pt was previously on dysphagia 1/ honey thick liquid diet and had just recently begun dysphagia 2 diet per SLP at SNF. Pt  later provided 1/2 tsp sized trials of honey thick liquids and puree consistency resulting in a delayed cough x1 which was unproductive. This pt with chronic dysphagia is at an increased risk of aspiration. Recommend downgrading diet to dysphagia 1 (puree) and honey-thick liquids by 1/2 teaspoon, meds crushed in puree, FULL supervision for feeding as pt is impulsive and with decreased safety awareness when it comes to swallow function. Pt would benefit from an MBS to objectively evaluate swallow function; however, this may not be completed until after the weekend. If pt is discharged prior to that, he may have an MBS as an outpatient. Will continue to follow for diet tolerance.  SLP Visit Diagnosis: Dysphagia, unspecified (R13.10)    Aspiration Risk  Moderate aspiration risk    Diet Recommendation Dysphagia 1 (Puree);Honey-thick liquid   Liquid Administration via: Spoon Medication Administration: Crushed with puree Supervision: Staff to assist with self feeding;Full supervision/cueing for compensatory strategies Compensations: Slow rate;Small sips/bites;Follow solids with liquid Postural Changes: Seated upright at 90 degrees;Remain upright for at least 30 minutes after po intake    Other  Recommendations Oral Care Recommendations: Oral care before and after PO Other Recommendations: Order thickener from pharmacy;Have oral suction available   Follow up Recommendations Skilled Nursing facility      Frequency and Duration min 2x/week  2 weeks       Prognosis Prognosis for Safe Diet Advancement: Guarded Barriers to Reach Goals: Time post onset;Severity of deficits      Swallow Study   General HPI: Pt is a 71 y.o.  male with PMH of CVA with L side hemiparesis, HTN, DVT who presented to ED on 5/3 with abdominal distention. Pt was recently diagnosed with PNA and was being treated at [facility] CXR showed poor inspiratory effort with basilar atelectassis. Pt currently on a soft diet with  honey-thick liquids; hx of dysphagia is noted but no previous SLP visits in chart. Bedside swallow eval and cog-linguistic evals ordered.  Pt is followed by SLP Lauris Poag at [facility]. He recently advanced to Dysphagia 2 diet and honey thick liquids. They have worked on the following compensatory strategies: slow rate, small bites, alternate bites/ sips. She recommended full supervision due to impulsivity with meals. Type of Study: Bedside Swallow Evaluation Previous Swallow Assessment: none in chart, receiving SLP services at SNF Diet Prior to this Study: Honey-thick liquids;Other (Comment) ("soft") Temperature Spikes Noted: No Respiratory Status: Nasal cannula History of Recent Intubation: No Behavior/Cognition: Alert;Cooperative Oral Cavity Assessment: Excessive secretions Oral Care Completed by SLP: No Oral Cavity - Dentition: Poor condition Vision: Functional for self-feeding Self-Feeding Abilities: Needs assist Patient Positioning: Upright in bed Baseline Vocal Quality: Wet Volitional Cough: Strong Volitional Swallow: Able to elicit    Oral/Motor/Sensory Function Overall Oral Motor/Sensory Function: Moderate impairment Facial ROM: Reduced left;Suspected CN VII (facial) dysfunction Facial Symmetry: Abnormal symmetry left Facial Strength: Reduced left Lingual ROM: Within Functional Limits   Ice Chips Ice chips: Not tested   Thin Liquid Thin Liquid: Not tested    Nectar Thick Nectar Thick Liquid: Not tested   Honey Thick Honey Thick Liquid: Impaired Presentation: Cup;Spoon Pharyngeal Phase Impairments: Wet Vocal Quality;Cough - Immediate;Cough - Delayed   Puree Puree: Impaired Presentation: Spoon Pharyngeal Phase Impairments: Cough - Delayed   Solid   GO   Solid: Not tested        Metro Kung, MA, CCC-SLP 10/22/2016,4:20 PM

## 2016-10-22 NOTE — Progress Notes (Signed)
ANTICOAGULATION CONSULT NOTE   Pharmacy Consult for Coumadin Indication: History DVT  Allergies  Allergen Reactions  . Penicillins     Has patient had a PCN reaction causing immediate rash, facial/tongue/throat swelling, SOB or lightheadedness with hypotension: unknown Has patient had a PCN reaction causing severe rash involving mucus membranes or skin necrosis: unknown Has patient had a PCN reaction that required hospitalization: unknown Has patient had a PCN reaction occurring within the last 10 years: unknown If all of the above answers are "NO", then may proceed with Cephalosporin use.     Patient Measurements: Height: 5\' 10"  (177.8 cm) Weight: 208 lb (94.3 kg) IBW/kg (Calculated) : 73  Vital Signs: Temp: 98.1 F (36.7 C) (05/04 0654) Temp Source: Oral (05/04 0654) BP: 134/72 (05/04 0654) Pulse Rate: 89 (05/04 0654)  Labs:  Recent Labs  10/19/16 1150 10/21/16 0700  10/21/16 0914 10/21/16 1401 10/21/16 1940 10/22/16 0015 10/22/16 0612  HGB 16.4 15.2  --  15.4  --   --   --  13.1  HCT 50.2 46.5  --  47.0  --   --   --  40.7  PLT 349 334  --  347  --   --   --  254  LABPROT 26.8* 30.9*  --   --   --   --   --  41.9*  INR 2.43 2.90  --   --   --   --   --  4.28*  CREATININE 1.13  --   --  1.72* 0.60*  --   --  1.09  TROPONINI  --   --   < > 0.06*  --  0.05* 0.05* 0.03*  < > = values in this interval not displayed.  Estimated Creatinine Clearance: 72.7 mL/min (by C-G formula based on SCr of 1.09 mg/dL).   Medical History: Past Medical History:  Diagnosis Date  . Dysphasia   . Fatty liver   . Hemiplegia (HCC)   . Hypertension   . Pneumonia   . Pulmonary embolism (HCC)   . Stroke [facility](HCC)     Medications:  Prescriptions Prior to Admission  Medication Sig Dispense Refill Last Dose  . acetaminophen (TYLENOL) 325 MG tablet Take 650 mg by mouth every 6 (six) hours as needed for moderate pain.    unknown  . betamethasone dipropionate (DIPROLENE) 0.05 % cream  Apply 1 application topically 2 (two) times daily. Apply to legs and hands NOT TO FACE, GROIN,AXILLA TRY CERAVAE FIRST twice  day    10/20/2016 at Unknown time  . Dextromethorphan-Guaifenesin (ROBITUSSIN DM PO) Take 15 mLs by mouth every 6 (six) hours as needed (cough).    unknown  . Eyelid Cleansers (SYSTANE LID WIPES EX) Apply 1 application topically 2 (two) times daily. Start occusoft lid scrubs: clean eyelids of oil and debris BID OU ( each eye ) before administering eye drops.    10/20/2016 at Unknown time  . ipratropium (ATROVENT) 0.02 % nebulizer solution Take 0.5 mg by nebulization every 6 (six) hours.   10/21/2016 at Unknown time  . labetalol (NORMODYNE) 200 MG tablet Take 200 mg by mouth daily. For HTN   10/20/2016 at 0800  . lactulose (CHRONULAC) 10 GM/15ML solution Give 15cc by mouth twice daily For constipation   10/20/2016 at Unknown time  . levofloxacin (LEVAQUIN) 500 MG tablet Take 500 mg by mouth daily.   10/20/2016 at Unknown time  . lisinopril (PRINIVIL,ZESTRIL) 20 MG tablet Take 20 mg by mouth daily.  10/20/2016 at Unknown time  . loratadine (CLARITIN) 10 MG tablet Take 10 mg by mouth daily.   10/20/2016 at Unknown time  . polyvinyl alcohol (LIQUIFILM TEARS) 1.4 % ophthalmic solution Place 1 drop into both eyes 2 (two) times daily.   10/20/2016 at Unknown time  . Pramoxine HCl (CERAVE ITCH RELIEF EX) Apply 1 application topically daily as needed (itching).   unknown  . predniSONE (DELTASONE) 20 MG tablet Take 40 mg by mouth daily with breakfast.   10/20/2016 at Unknown time  . sennosides-docusate sodium (SENOKOT-S) 8.6-50 MG tablet Take 1 tablet by mouth at bedtime. For constipation   10/20/2016 at Unknown time  . warfarin (COUMADIN) 2 MG tablet Take 2 mg by mouth daily at 6 PM. Give 2 mg by mouth once a day   10/20/2016 at 1700  . White Petrolatum-Mineral Oil (SYSTANE NIGHTTIME) OINT Place 1 application into the left eye at bedtime. Apply to left eye at night    10/20/2016 at Unknown time     Assessment: Continuation of Coumadin for history of DVT. INR today is supratherapeutic. Lower than home dose given yesterday but patient also started on antibiotics.   Goal of Therapy:  INR 2-3 Monitor platelets by anticoagulation protocol: Yes   Plan:  No Coumadin today INR/PT daily Monitor for s/s of bleeding  Elder Cyphers, BS Loura Back, BCPS Clinical Pharmacist Pager [phone] 10/22/2016,11:21 AM

## 2016-10-22 NOTE — Progress Notes (Addendum)
Pharmacy Antibiotic Note  [name] [name] is a 71 y.o. male admitted on 10/21/2016 with UTI.  Pharmacy has been consulted for Merrem dosing. [facility]ed [facility] on levaquin for PNA and last dose documented given 5/2. Urine cx 5/2 shows Citrobacter 60,000 CFU/ml which is resistant to cipro. Antibiotics changed according to sensitivities.  Plan: Merrem 1gm IV q8h F/U cxs and clinical progress Monitor V/S and labs Deescalate as indicated  Height: 5\' 10"  (177.8 cm) Weight: 208 lb (94.3 kg) IBW/kg (Calculated) : 73  Temp (24hrs), Avg:98 F (36.7 C), Min:97.8 F (36.6 C), Max:98.2 F (36.8 C)   Recent Labs Lab 10/19/16 1150 10/21/16 0700 10/21/16 0914 10/21/16 0926 10/21/16 1236 10/21/16 1401 10/22/16 0612  WBC 11.5* 14.5* 16.4*  --   --   --  10.6*  CREATININE 1.13  --  1.72*  --   --  0.60* 1.09  LATICACIDVEN  --   --   --  1.51 1.20  --   --     Estimated Creatinine Clearance: 72.7 mL/min (by C-G formula based on SCr of 1.09 mg/dL).    Allergies  Allergen Reactions  . Penicillins     Has patient had a PCN reaction causing immediate rash, facial/tongue/throat swelling, SOB or lightheadedness with hypotension: unknown Has patient had a PCN reaction causing severe rash involving mucus membranes or skin necrosis: unknown Has patient had a PCN reaction that required hospitalization: unknown Has patient had a PCN reaction occurring within the last 10 years: unknown If all of the above answers are "NO", then may proceed with Cephalosporin use. 5/3 Tolerated Cefepime x 1 dose in ED.     Antimicrobials this admission: Cipro  5/3 >> 5/4 Merrem 5/4  >>   Microbiology: 5/2 UCx: Citrobacter s- Gentamicin, imipenem, and septra I- Ceftriaxone and nitrofurantioin R- Cipro, Cefazolin 5/3 BCx: ngtd 5/3 MRSA PCR: negative   Thank you for allowing pharmacy to be a part of this patient's care.  Elder CyphersLorie Helyne Genther, BS Pharm D, [place] Clinical Pharmacist Pager  [phone] 10/22/2016 11:40 AM

## 2016-10-22 NOTE — Care Management Important Message (Signed)
Important Message  Patient Details  Name: [name] [name] MRN: [id number] Date of Birth: 02-Jan-1946   Medicare Important Message Given:  Yes    Malcolm MetroChildress, Jaydeen Odor Demske, RN 10/22/2016, 9:44 AM

## 2016-10-22 NOTE — Progress Notes (Signed)
Dr. Patty Sermonsehman's successful decompression endoscopically appreciated. Will follow peripherally with you.

## 2016-10-22 NOTE — Clinical Social Work Note (Signed)
Clinical Social Work Assessment  Patient Details  Name: [name] [name] MRN: [id number] Date of Birth: 1946-05-25  Date of referral:  10/22/16               Reason for consult:  Discharge Planning (return to facility)                Permission sought to share information with:  Case Manager, Magazine features editoracility Contact Representative, Family Supports Permission granted to share information::  Yes, Verbal Permission Granted  Name::        Agency::  [facility]   Relationship::  brother  Contact Information:     Housing/Transportation Living arrangements for the past 2 months:  Skilled Building surveyorursing Facility Source of Information:  Patient, Medical Team, Sports coachCase Manager, Facility Patient Interpreter Needed:  None Criminal Activity/Legal Involvement Pertinent to Current Situation/Hospitalization:  No - Comment as needed Significant Relationships:  Adult Children, Other Family Members Lives with:  Facility Resident Do you feel safe going back to the place where you live?  Yes Need for family participation in patient care:  Yes (Comment)  Care giving concerns:  [facility]. Plans to return at discharge.   Social Worker assessment / plan:  Assessment completed. No concerns with care or needs. [facility]. Updated [facility] of patient progress, no barriers with return.  Employment status:  Retired Health and safety inspectornsurance information:  Medicare PT Recommendations:  Not assessed at this time Information / Referral to community resources:     Patient/Family's Response to care:  Agreeable  Patient/Family's Understanding of and Emotional Response to Diagnosis, Current Treatment, and Prognosis:  Appears to understand reason for admission and needs for treatment.   Emotional Assessment Appearance:  Appears stated age Attitude/Demeanor/Rapport:    Affect (typically observed):  Accepting, Adaptable Orientation:  Oriented to Self, Oriented to Place Alcohol / Substance use:   Not Applicable Psych involvement (Current and /or in the community):  No (Comment)  Discharge Needs  Concerns to be addressed:  No discharge needs identified Readmission within the last 30 days:  No Current discharge risk:  None Barriers to Discharge:  No Barriers Identified   Raye SorrowCoble, Adanya Sosinski N, LCSW 10/22/2016, 9:53 AM

## 2016-10-22 NOTE — Evaluation (Signed)
Physical Therapy Evaluation Patient Details Name: [name] [name] MRN: [id number] DOB: Aug 02, 1945 Today's Date: 10/22/2016   History of Present Illness  71 y.o. male with medical history significant of CVA with left sided hemiparesis, HTN, DVT that presents with abdominal distention.  Brother is bedside and does not know how long this distention has been going on.  Per son patient was recently diagnosed with pneumonia for which he was getting treatment at [facility].  Brother states this morning a nurse from [facility] called him and stated patient's pneumonia was worsening.    Brother does not know if patient had any fevers, chills, abdominal pain, shortness of breath.  Patient has a history of constipation for which his brother states he needs to be monitored for stool frequently.  Is on coumadin chronically for history of DVT. CT renal study showing volvulus s/p 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decompressed. 10/21/2016  Clinical Impression  Pt received in bed, and was agreeable to PT evaluation.  Pt lives at [facility], where he states that he receives assistance for bathing, but is independent with dressing.  He also states that he mobilizes independently in the w/c, and is able to transfer into the w/c independently.  Brother arrived at end of evaluation, and is unable to confirm this information.  During PT evaluation today, he required Max A for supine<>sit, and Total A for sit<>supine due to dense chronic R hemiplegia.  He required assistance for static sitting balance due to LOB to the right.  Recommend he return to [facility] with PT.      Follow Up Recommendations SNF    Equipment Recommendations  None recommended by PT    Recommendations for Other Services       Precautions / Restrictions Precautions Precautions: Fall Precaution Comments: due to immobility Restrictions Weight Bearing Restrictions: No      Mobility  Bed Mobility Overal bed mobility: Needs  Assistance Bed Mobility: Rolling;Supine to Sit;Sit to Supine Rolling: Max assist   Supine to sit: Max assist;HOB elevated Sit to supine: Total assist   General bed mobility comments: Pt demonstrates righ sided lean when sitting on the EOB.    Transfers Overall transfer level:  (NA due to poor static sitting balance)                  Ambulation/Gait                Stairs            Wheelchair Mobility    Modified Rankin (Stroke Patients Only)       Balance                                             Pertinent Vitals/Pain Pain Assessment: No/denies pain    Home Living     Available Help at Discharge: Skilled Nursing Facility           Home Equipment: Wheelchair - manual      Prior Function     Gait / Transfers Assistance Needed: Pt mobilizes in the w/c using both UE and LE.  Pt reports that he is independent with transfers in/out of the w/c.   ADL's / Homemaking Assistance Needed: pt reports that he is independent with dressing, assistance for bathing.          Hand Dominance  Extremity/Trunk Assessment   Upper Extremity Assessment Upper Extremity Assessment: RUE deficits/detail;LUE deficits/detail RUE Deficits / Details: hemiparesis LUE Deficits / Details: Ataxic    Lower Extremity Assessment Lower Extremity Assessment: RLE deficits/detail;LLE deficits/detail RLE Deficits / Details: hemiparesis with foot drop LLE Deficits / Details: Ataxic       Communication      Cognition Arousal/Alertness: Awake/alert Behavior During Therapy: WFL for tasks assessed/performed                                          General Comments      Exercises General Exercises - Upper Extremity Shoulder Flexion: AROM;Left;5 reps General Exercises - Lower Extremity Long Arc Quad: Left;5 reps;AROM;Limitations Long Arc Quad Limitations: limited ROM due to poor sitting balance.    Assessment/Plan     PT Assessment Patient needs continued PT services  PT Problem List Decreased strength;Decreased range of motion;Decreased activity tolerance;Decreased balance;Decreased mobility;Decreased coordination;Decreased knowledge of use of DME;Decreased safety awareness;Decreased knowledge of precautions       PT Treatment Interventions DME instruction;Gait training;Functional mobility training;Therapeutic activities;Therapeutic exercise;Balance training;Patient/family education    PT Goals (Current goals can be found in the Care Plan section)  Acute Rehab PT Goals Patient Stated Goal: [facility] PT Goal Formulation: With patient/family Time For Goal Achievement: 11/05/16 Potential to Achieve Goals: Fair    Frequency Min 3X/week   Barriers to discharge Decreased caregiver support      Co-evaluation               AM-PAC PT "6 Clicks" Daily Activity  Outcome Measure Difficulty turning over in bed (including adjusting bedclothes, sheets and blankets)?: A Lot Difficulty moving from lying on back to sitting on the side of the bed? : A Lot Difficulty sitting down on and standing up from a chair with arms (e.g., wheelchair, bedside commode, etc,.)?: Total Help needed moving to and from a bed to chair (including a wheelchair)?: Total Help needed walking in hospital room?: Total Help needed climbing 3-5 steps with a railing? : Total 6 Click Score: 8    End of Session Equipment Utilized During Treatment: Oxygen Activity Tolerance: Patient limited by fatigue Patient left: in bed;with call bell/phone within reach;with family/visitor present Nurse Communication: Mobility status PT Visit Diagnosis: History of falling (Z91.81);Muscle weakness (generalized) (M62.81);Other abnormalities of gait and mobility (R26.89)    Time: 1432-1510 PT Time Calculation (min) (ACUTE ONLY): 38 min   Charges:   PT Evaluation $PT Eval Low Complexity: 1 Procedure PT Treatments $Therapeutic  Activity: 23-37 mins   PT G Codes:   PT G-Codes **NOT FOR INPATIENT CLASS** Functional Assessment Tool Used: AM-PAC 6 Clicks Basic Mobility;Clinical judgement Functional Limitation: Mobility: Walking and moving around Mobility: Walking and Moving Around Current Status (W1191(G8978): At least 40 percent but less than 60 percent impaired, limited or restricted Mobility: Walking and Moving Around Goal Status [phone](G8979): At least 20 percent but less than 40 percent impaired, limited or restricted    Beth Denim Kalmbach, PT, DPT X: [phone]

## 2016-10-22 NOTE — Progress Notes (Signed)
  Subjective:  Patient states he did not eat much of his lunch because he had tickle in his throat. He denies abdominal pain. He says he did pass small amount of flatus earlier today and needed have a small bowel movement earlier today.   Objective: Blood pressure 134/72, pulse 89, temperature 98.1 F (36.7 C), temperature source Oral, resp. rate 18, height 5\' 10"  (1.778 m), weight 208 lb (94.3 kg), SpO2 95 %. Patient is alert and in no acute distress.  He has drooping of left eyelid and ectropion of lower eyelid. Abdomen is distended but not as much as yesterday. Bowel sounds are normal. On palpation it is soft and nontender.  Labs/studies Results:   Recent Labs  10/21/16 0700 10/21/16 0914 10/22/16 0612  WBC 14.5* 16.4* 10.6*  HGB 15.2 15.4 13.1  HCT 46.5 47.0 40.7  PLT 334 347 254    BMET   Recent Labs  10/21/16 0914 10/21/16 1401 10/22/16 0612  NA 141  --  144  K 3.7  --  4.1  CL 106  --  112*  CO2 26  --  26  GLUCOSE 163*  --  97  BUN 51*  --  35*  CREATININE 1.72* 0.60* 1.09  CALCIUM 9.1  --  8.4*    LFT   Recent Labs  10/21/16 0914  PROT 8.2*  ALBUMIN 3.5  AST 38  ALT 45  ALKPHOS 59  BILITOT 0.7    PT/INR   Recent Labs  10/21/16 0700 10/22/16 0612  LABPROT 30.9* 41.9*  INR 2.90 4.28*     Assessment:  #1. Sigmoid volvulus. Status post flexible sigmoidoscopy with decompression. Abdomen is more distended than yesterday but not tense or tender. He is at risk for recurrent volvulus. He needs to be washed for another 24 hours before he is discharged. #2. Oropharyngeal dysphagia. Appears to be chronic problem but they have gotten worse lately. Being addressed by Dr. Rinaldo RatelKadolph.   Recommendations:  Polyethylene glycol 17 g by mouth daily at bedtime. Fleet enema tonight. Encourage patient to use bedside commode twice daily. KUB in a.m.

## 2016-10-22 NOTE — Progress Notes (Addendum)
PT GIVEN FLEETS ENEMA THEN UP WITH HOYER LIFT TO BSC. PT HAD MOD LIGHT BROWN LIQUID STOOL THAT CONTAINED SOME MUCUS.

## 2016-10-23 ENCOUNTER — Inpatient Hospital Stay (HOSPITAL_COMMUNITY): Payer: Medicare Other

## 2016-10-23 LAB — PROTIME-INR
INR: 3.16
Prothrombin Time: 33.2 seconds — ABNORMAL HIGH (ref 11.4–15.2)

## 2016-10-23 MED ORDER — SODIUM CHLORIDE 0.9 % IV SOLN
1.0000 g | Freq: Three times a day (TID) | INTRAVENOUS | Status: DC
  Administered 2016-10-23: 1 g via INTRAVENOUS
  Filled 2016-10-23 (×7): qty 1

## 2016-10-23 MED ORDER — POLYETHYLENE GLYCOL 3350 17 G PO PACK: 17.0000 g | PACK | Freq: Every day | ORAL | 0 refills | Status: DC

## 2016-10-23 MED ORDER — SULFAMETHOXAZOLE-TRIMETHOPRIM 200-40 MG/5ML PO SUSP
20.0000 mL | Freq: Two times a day (BID) | ORAL | Status: DC
  Filled 2016-10-23 (×4): qty 20

## 2016-10-23 MED ORDER — SULFAMETHOXAZOLE-TRIMETHOPRIM 200-40 MG/5ML PO SUSP
20.0000 mL | Freq: Two times a day (BID) | ORAL | Status: DC
  Filled 2016-10-23 (×5): qty 20

## 2016-10-23 MED ORDER — SULFAMETHOXAZOLE-TRIMETHOPRIM 200-40 MG/5ML PO SUSP: 20.0000 mL | Freq: Two times a day (BID) | ORAL | 0 refills | Status: DC

## 2016-10-23 MED ORDER — WARFARIN SODIUM 2 MG PO TABS: 2.0000 mg | ORAL_TABLET | Freq: Every day | ORAL | Status: DC

## 2016-10-23 NOTE — Progress Notes (Signed)
RT (R) arrived to pt room at 9 am to do portable KUB but pt. Was eating breakfast and is a high aspiration risk per nurse.  Nurse said that pt would need at least 15 minutes of sitting upright before being able to have exam done

## 2016-10-23 NOTE — Progress Notes (Signed)
[facility]  [facility]  Transfer cleared by SW.  Belongings and copy of AVS sent with patient, patient in NAD at this time.

## 2016-10-23 NOTE — Progress Notes (Signed)
ANTICOAGULATION CONSULT NOTE   Pharmacy Consult for Coumadin Indication: History DVT  Allergies  Allergen Reactions  . Penicillins     Has patient had a PCN reaction causing immediate rash, facial/tongue/throat swelling, SOB or lightheadedness with hypotension: unknown Has patient had a PCN reaction causing severe rash involving mucus membranes or skin necrosis: unknown Has patient had a PCN reaction that required hospitalization: unknown Has patient had a PCN reaction occurring within the last 10 years: unknown If all of the above answers are "NO", then may proceed with Cephalosporin use. 5/3 Tolerated Cefepime x 1 dose in ED.     Patient Measurements: Height: 5\' 10"  (177.8 cm) Weight: 208 lb (94.3 kg) IBW/kg (Calculated) : 73  Vital Signs: Temp: 98.4 F (36.9 C) (05/05 0551) Temp Source: Oral (05/05 0551) BP: 144/81 (05/05 0551) Pulse Rate: 95 (05/05 0551)  Labs:  Recent Labs  10/21/16 0700  10/21/16 0914 10/21/16 1401 10/21/16 1940 10/22/16 0015 10/22/16 0612 10/23/16 0632  HGB 15.2  --  15.4  --   --   --  13.1  --   HCT 46.5  --  47.0  --   --   --  40.7  --   PLT 334  --  347  --   --   --  254  --   LABPROT 30.9*  --   --   --   --   --  41.9* 33.2*  INR 2.90  --   --   --   --   --  4.28* 3.16  CREATININE  --   --  1.72* 0.60*  --   --  1.09  --   TROPONINI  --   < > 0.06*  --  0.05* 0.05* 0.03*  --   < > = values in this interval not displayed.  Estimated Creatinine Clearance: 72.7 mL/min (by C-G formula based on SCr of 1.09 mg/dL).   Medical History: Past Medical History:  Diagnosis Date  . Dysphasia   . Fatty liver   . Hemiplegia (HCC)   . Hypertension   . Pneumonia   . Pulmonary embolism (HCC)   . Stroke [facility](HCC)     Medications:  Prescriptions Prior to Admission  Medication Sig Dispense Refill Last Dose  . acetaminophen (TYLENOL) 325 MG tablet Take 650 mg by mouth every 6 (six) hours as needed for moderate pain.    unknown  . betamethasone  dipropionate (DIPROLENE) 0.05 % cream Apply 1 application topically 2 (two) times daily. Apply to legs and hands NOT TO FACE, GROIN,AXILLA TRY CERAVAE FIRST twice  day    10/20/2016 at Unknown time  . Dextromethorphan-Guaifenesin (ROBITUSSIN DM PO) Take 15 mLs by mouth every 6 (six) hours as needed (cough).    unknown  . Eyelid Cleansers (SYSTANE LID WIPES EX) Apply 1 application topically 2 (two) times daily. Start occusoft lid scrubs: clean eyelids of oil and debris BID OU ( each eye ) before administering eye drops.    10/20/2016 at Unknown time  . ipratropium (ATROVENT) 0.02 % nebulizer solution Take 0.5 mg by nebulization every 6 (six) hours.   10/21/2016 at Unknown time  . labetalol (NORMODYNE) 200 MG tablet Take 200 mg by mouth daily. For HTN   10/20/2016 at 0800  . lactulose (CHRONULAC) 10 GM/15ML solution Give 15cc by mouth twice daily For constipation   10/20/2016 at Unknown time  . levofloxacin (LEVAQUIN) 500 MG tablet Take 500 mg by mouth daily.   10/20/2016 at Unknown  time  . lisinopril (PRINIVIL,ZESTRIL) 20 MG tablet Take 20 mg by mouth daily.   10/20/2016 at Unknown time  . loratadine (CLARITIN) 10 MG tablet Take 10 mg by mouth daily.   10/20/2016 at Unknown time  . polyvinyl alcohol (LIQUIFILM TEARS) 1.4 % ophthalmic solution Place 1 drop into both eyes 2 (two) times daily.   10/20/2016 at Unknown time  . Pramoxine HCl (CERAVE ITCH RELIEF EX) Apply 1 application topically daily as needed (itching).   unknown  . predniSONE (DELTASONE) 20 MG tablet Take 40 mg by mouth daily with breakfast.   10/20/2016 at Unknown time  . sennosides-docusate sodium (SENOKOT-S) 8.6-50 MG tablet Take 1 tablet by mouth at bedtime. For constipation   10/20/2016 at Unknown time  . warfarin (COUMADIN) 2 MG tablet Take 2 mg by mouth daily at 6 PM. Give 2 mg by mouth once a day   10/20/2016 at 1700  . White Petrolatum-Mineral Oil (SYSTANE NIGHTTIME) OINT Place 1 application into the left eye at bedtime. Apply to left eye at night     10/20/2016 at Unknown time    Assessment: Continuation of Coumadin for history of DVT. INR today is supratherapeutic. Lower than home dose given yesterday but patient also started on antibiotics. INR is 3.16 and trending down now. Will use caution in dosing coumadin with start of septra DS for UTI( citrobacter in urine,limited treatment options orally based on susceptibilities) which has significant drug interaction resulting in increased warfarin exposure.   Goal of Therapy:  INR 2-3 Monitor platelets by anticoagulation protocol: Yes   Plan:  No Coumadin today INR/PT daily Monitor for s/s of bleeding  Elder Cyphers, BS Loura Back, BCPS Clinical Pharmacist Pager [phone] 10/23/2016,10:12 AM

## 2016-10-23 NOTE — Progress Notes (Signed)
  Subjective:  Patient denies abdominal pain nausea or vomiting. He did pass flatus and had small bowel movement when he was assisted to bedside commode. According to nursing staff he ate all of his breakfast.  Objective: Blood pressure (!) 144/81, pulse 95, temperature 98.4 F (36.9 C), temperature source Oral, resp. rate 18, height 5\' 10"  (1.778 m), weight 208 lb (94.3 kg), SpO2 93 %. Patient is alert and in no acute distress. He has dysarthria. He also has left-sided facial weakness with drooping of left upper eyelid. Abdomen is distended. Bowel sounds are normal. It is soft and nontender with small umbilicus hernia which is reducible. Rectal examination reveals distended rectum without stool.   Labs/studies Results:   Recent Labs  10/21/16 0700 10/21/16 0914 10/22/16 0612  WBC 14.5* 16.4* 10.6*  HGB 15.2 15.4 13.1  HCT 46.5 47.0 40.7  PLT 334 347 254    BMET   Recent Labs  10/21/16 0914 10/21/16 1401 10/22/16 0612  NA 141  --  144  K 3.7  --  4.1  CL 106  --  112*  CO2 26  --  26  GLUCOSE 163*  --  97  BUN 51*  --  35*  CREATININE 1.72* 0.60* 1.09  CALCIUM 9.1  --  8.4*     PT/INR   Recent Labs  10/22/16 0612 10/23/16 0632  LABPROT 41.9* 33.2*  INR 4.28* 3.16    Portable KUB reveals diffuse colonic distention.  Assessment:  #1. Sigmoid volvulus. Patient underwent endoscopic decompression 2 days ago. He is having bowel movement and passing some flatus. 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 Patient is at increased risk for recurrent sigmoid volvulus.   Recommendations:  Continue polyethylene glycol daily. Patient will need to be turned in bed frequently and encouraged to pass flatus. Rectal tube if available can be used on as needed basis. Patient should be assisted to bedside commode twice daily for at least 15 minutes each time.

## 2016-10-23 NOTE — Discharge Summary (Addendum)
Physician Discharge Summary  [name] [name]:[id number] DOB: 1946/01/13 DOA: 10/21/2016  PCP: Mahlon Gammon, MD  Admit date: 10/21/2016 Discharge date: 10/23/2016  Admitted From: SNF- [facility] Disposition:  SNF- [facility]  Recommendations for Outpatient Follow-up:  1. Follow up with PCP in 1-2 weeks 2. Get INR checked within 2 days of starting Septra 3. Patient must be placed on bedside commode at least twice a day for at least 15 minutes each time 4. Please obtain BMP/CBC in one week 5. Patient needs outpatient barium swallow study 6. Dysphagia 2 diet 7. 5 more days of septra  Home Health: No  Equipment/Devices: Oxygen 2L   Discharge Condition: Stable CODE STATUS: Full Code Diet recommendation: Heart Healthy   Brief/Interim Summary: [name] [name] a 71 y.o.malewith medical history significant of CVA with left sided hemiparesis, HTN, DVT that presents with abdominal distention. Brother is bedside and does not know how long this distention has been going on. Per son patient was recently diagnosed with pneumonia for which he was getting treatment at [facility]. Brother states this morning a nurse from [facility] called him and stated patient's pneumonia was worsening. Brother does not know if patient had any fevers, chills, abdominal pain, shortness of breath. Patient has a history of constipation for which his brother states he needs to be monitored for stool frequently. Is on coumadin chronically for history of DVT.  Patient found to have volvulus on CT scan of abdomen.  General surgery and Gastroenterology consulted.  Patient underwent colonoscope decompression on 10/21/16.  He was treated with Meropenem for his urinary tract infection.  KUB performed on 10/23/16 showed dilated loops of bowel.  Discussed findings with Dr. Karilyn Cota and believe this could be a chronic issue.  Patient was stable for discharge with new bowel regimen and prescription for antibiotics for  urinary tract infection.  Discharge Diagnoses:  Active Problems:   HTN (hypertension)   DVT (deep venous thrombosis) (HCC)   Constipation   COPD (chronic obstructive pulmonary disease) (HCC)   Volvulus (HCC)    Discharge Instructions  Discharge Instructions    Call MD for:  difficulty breathing, headache or visual disturbances    Complete by:  As directed    Call MD for:  extreme fatigue    Complete by:  As directed    Call MD for:  hives    Complete by:  As directed    Call MD for:  persistant dizziness or light-headedness    Complete by:  As directed    Call MD for:  persistant nausea and vomiting    Complete by:  As directed    Call MD for:  severe uncontrolled pain    Complete by:  As directed    Call MD for:  temperature >100.4    Complete by:  As directed    Discharge instructions    Complete by:  As directed    Need referral for barium swallow Patient needs to be placed on bedside commode 2x/day for at least 15 minutes each time Stop lactulose Continue miralax and senna Patient at risk for recurrence of volvulus- please monitor for abdominal distention and pain Hold coumadin on 5/5 and 5/6- get INR on 5/7 and restart according (needs close monitoring on septra)   Increase activity slowly    Complete by:  As directed      Allergies as of 10/23/2016      Reactions   Penicillins    Has patient had  a PCN reaction causing immediate rash, facial/tongue/throat swelling, SOB or lightheadedness with hypotension: unknown Has patient had a PCN reaction causing severe rash involving mucus membranes or skin necrosis: unknown Has patient had a PCN reaction that required hospitalization: unknown Has patient had a PCN reaction occurring within the last 10 years: unknown If all of the above answers are "NO", then may proceed with Cephalosporin use. 5/3 Tolerated Cefepime x 1 dose in ED.      Medication List    STOP taking these medications   lactulose 10 GM/15ML  solution Commonly known as:  CHRONULAC   levofloxacin 500 MG tablet Commonly known as:  LEVAQUIN     TAKE these medications   acetaminophen 325 MG tablet Commonly known as:  TYLENOL Take 650 mg by mouth every 6 (six) hours as needed for moderate pain.   betamethasone dipropionate 0.05 % cream Commonly known as:  DIPROLENE Apply 1 application topically 2 (two) times daily. Apply to legs and hands NOT TO FACE, GROIN,AXILLA TRY CERAVAE FIRST twice  day   CERAVE [facility] RELIEF EX Apply 1 application topically daily as needed (itching).   ipratropium 0.02 % nebulizer solution Commonly known as:  ATROVENT Take 0.5 mg by nebulization every 6 (six) hours.   labetalol 200 MG tablet Commonly known as:  NORMODYNE Take 200 mg by mouth daily. For HTN   lisinopril 20 MG tablet Commonly known as:  PRINIVIL,ZESTRIL Take 20 mg by mouth daily.   loratadine 10 MG tablet Commonly known as:  CLARITIN Take 10 mg by mouth daily.   polyethylene glycol packet Commonly known as:  MIRALAX / GLYCOLAX Take 17 g by mouth daily. Start taking on:  10/24/2016   polyvinyl alcohol 1.4 % ophthalmic solution Commonly known as:  LIQUIFILM TEARS Place 1 drop into both eyes 2 (two) times daily.   predniSONE 20 MG tablet Commonly known as:  DELTASONE Take 40 mg by mouth daily with breakfast.   ROBITUSSIN DM PO Take 15 mLs by mouth every 6 (six) hours as needed (cough).   sennosides-docusate sodium 8.6-50 MG tablet Commonly known as:  SENOKOT-S Take 1 tablet by mouth at bedtime. For constipation   sulfamethoxazole-trimethoprim 200-40 MG/5ML suspension Commonly known as:  BACTRIM,SEPTRA Take 20 mLs by mouth every 12 (twelve) hours.   SYSTANE LID WIPES EX Apply 1 application topically 2 (two) times daily. Start occusoft lid scrubs: clean eyelids of oil and debris BID OU ( each eye ) before administering eye drops.   SYSTANE NIGHTTIME Oint Place 1 application into the left eye at bedtime. Apply to left  eye at night   warfarin 2 MG tablet Commonly known as:  COUMADIN Take 1 tablet (2 mg total) by mouth daily at 6 PM. Give 2 mg by mouth once a day Start taking on:  10/25/2016       Allergies  Allergen Reactions  . Penicillins     Has patient had a PCN reaction causing immediate rash, facial/tongue/throat swelling, SOB or lightheadedness with hypotension: unknown Has patient had a PCN reaction causing severe rash involving mucus membranes or skin necrosis: unknown Has patient had a PCN reaction that required hospitalization: unknown Has patient had a PCN reaction occurring within the last 10 years: unknown If all of the above answers are "NO", then may proceed with Cephalosporin use. 5/3 Tolerated Cefepime x 1 dose in ED.     Consultations:  General Surgery  Gastroenterology   Procedures/Studies: Dg Chest Port 1 View  Result Date: 10/21/2016 CLINICAL  DATA:  Shortness of breath and fevers EXAM: PORTABLE CHEST 1 VIEW COMPARISON:  03/20/2015 FINDINGS: Cardiac shadow is mildly enlarged but stable. Overall poor inspiratory effort is noted. Some basilar atelectasis is seen likely related to the poor inspiratory effort. No sizable effusion is seen. No bony abnormality is noted. IMPRESSION: Poor inspiratory effort with basilar atelectasis. Electronically Signed   By: Alcide Clever M.D.   On: 10/21/2016 09:47   Dg Abd Portable 1v  Result Date: 10/23/2016 CLINICAL DATA:  Followup sigmoid volvulus. EXAM: PORTABLE ABDOMEN - 1 VIEW COMPARISON:  None. FINDINGS: There is continued diffuse abnormal gaseous distension of the large bowel with marked gaseous distension of the sigmoid colon. No free intraperitoneal air identified. IMPRESSION: 1. Persistent abnormal distension of the bowel loops particularly the sigmoid colon. Recurrent sigmoid volvulus would be difficult to exclude based on plain film radiographs and if the patient is clinically obstructed consider repeat CT of the abdomen and pelvis.  Electronically Signed   By: Signa Kell M.D.   On: 10/23/2016 10:24   Ct Renal Stone Study  Result Date: 10/21/2016 CLINICAL DATA:  Abdominal distention and shortness of Breath EXAM: CT ABDOMEN AND PELVIS WITHOUT CONTRAST TECHNIQUE: Multidetector CT imaging of the abdomen and pelvis was performed following the standard protocol without IV contrast. COMPARISON:  12/28/2007 FINDINGS: Lower chest: Bibasilar consolidation is noted right greater than left. Hepatobiliary: With the exception of a small cyst in the right lobe of the liver the liver and gallbladder are within normal limits. Pancreas: Unremarkable. No pancreatic ductal dilatation or surrounding inflammatory changes. Spleen: Normal in size without focal abnormality. Adrenals/Urinary Tract: The adrenal glands are within normal limits. Renal vascular calcifications are noted. Mild fullness of the left collecting system is seen which may be related to edema from recently passed stone as no definitive stone is identified. The bladder is well distended. Stomach/Bowel: The colon is distended with air and fecal material. There is an area of focal narrowing in the sigmoid colon best seen on image number 54 of series 2 and image 53 of series 7. There are changes consistent with a mobile sigmoid and early sigmoid volvulus. The colon proximal to the area of narrowing in the sigmoid is significantly dilated. On the coronal imaging significant rotation of the sigmoid and its associated vascularity is noted. The appendix is within normal limits. No other bowel abnormality is seen. Vascular/Lymphatic: Aortic atherosclerosis. No enlarged abdominal or pelvic lymph nodes. Reproductive: Prostate is unremarkable. Other: Small fat containing umbilical hernia is noted. Musculoskeletal: Degenerative changes of lumbar spine are seen. No acute bony abnormality is noted. IMPRESSION: Changes consistent with sigmoid volume ileus and proximal colonic obstructive change. The distal  most: Beyond the volvulus is decompressed. Surgical consultation is recommended. Mild fullness of the left renal collecting system without definitive stone. These results were called by telephone at the time of interpretation on 10/21/2016 at 11:32 am to Dr. Marily Memos , who verbally acknowledged these results. Electronically Signed   By: Alcide Clever M.D.   On: 10/21/2016 11:33      Subjective: Patient seen this am.  Reports he is getting up to use the bathroom.  Denies any abdominal pain.  Reports he passed flatus last night.  Discharge Exam: Vitals:   10/22/16 2147 10/23/16 0551  BP: 115/73 (!) 144/81  Pulse: 96 95  Resp: 18 18  Temp: 98.4 F (36.9 C) 98.4 F (36.9 C)   Vitals:   10/22/16 2037 10/22/16 2147 10/23/16 0551 10/23/16 1511  BP:  115/73 (!) 144/81   Pulse:  96 95   Resp:  18 18   Temp:  98.4 F (36.9 C) 98.4 F (36.9 C)   TempSrc:  Oral Oral   SpO2: 92% 96% 93% (!) 87%  Weight:      Height:        General: Pt is alert, awake, not in acute distress Cardiovascular: RRR, S1/S2 +, no rubs, no gallops Respiratory: CTA bilaterally, no wheezing, no rhonchi Abdominal: Soft, NT, slightly distended, bowel sounds + Extremities: no edema, no cyanosis, contracture of the right side most prominent of the upper extremity at the elbow, right sided hemiplegia    The results of significant diagnostics from this hospitalization (including imaging, microbiology, ancillary and laboratory) are listed below for reference.     Microbiology: Recent Results (from the past 240 hour(s))  Culture, Urine     Status: Abnormal   Collection Time: 10/20/16  2:30 AM  Result Value Ref Range Status   Specimen Description URINE, CLEAN CATCH  Final   Special Requests NONE  Final   Culture 60,000 COLONIES/mL CITROBACTER KOSERI (A)  Final   Report Status 10/22/2016 FINAL  Final   Organism ID, Bacteria CITROBACTER KOSERI (A)  Final      Susceptibility   Citrobacter koseri - MIC*     CEFAZOLIN >=64 RESISTANT Resistant     CEFTRIAXONE 32 INTERMEDIATE Intermediate     CIPROFLOXACIN >=4 RESISTANT Resistant     GENTAMICIN <=1 SENSITIVE Sensitive     IMIPENEM 1 SENSITIVE Sensitive     NITROFURANTOIN 64 INTERMEDIATE Intermediate     TRIMETH/SULFA <=20 SENSITIVE Sensitive     PIP/TAZO 32 INTERMEDIATE Intermediate     * 60,000 COLONIES/mL CITROBACTER KOSERI  Blood Culture (routine x 2)     Status: None (Preliminary result)   Collection Time: 10/21/16  9:14 AM  Result Value Ref Range Status   Specimen Description BLOOD RIGHT HAND  Final   Special Requests   Final    BOTTLES DRAWN AEROBIC AND ANAEROBIC Blood Culture adequate volume   Culture NO GROWTH 2 DAYS  Final   Report Status PENDING  Incomplete  Blood Culture (routine x 2)     Status: None (Preliminary result)   Collection Time: 10/21/16  9:27 AM  Result Value Ref Range Status   Specimen Description BLOOD RIGHT FOREARM  Final   Special Requests   Final    BOTTLES DRAWN AEROBIC AND ANAEROBIC Blood Culture adequate volume   Culture NO GROWTH 2 DAYS  Final   Report Status PENDING  Incomplete  MRSA PCR Screening     Status: None   Collection Time: 10/21/16  4:43 PM  Result Value Ref Range Status   MRSA by PCR NEGATIVE NEGATIVE Final    Comment:        The GeneXpert MRSA Assay (FDA approved for NASAL specimens only), is one component of a comprehensive MRSA colonization surveillance program. It is not intended to diagnose MRSA infection nor to guide or monitor treatment for MRSA infections.      Labs: BNP (last 3 results)  Recent Labs  10/21/16 0914  BNP 58.0   Basic Metabolic Panel:  Recent Labs Lab 10/19/16 1150 10/21/16 0914 10/21/16 1401 10/22/16 0612  NA 140 141  --  144  K 4.3 3.7  --  4.1  CL 103 106  --  112*  CO2 25 26  --  26  GLUCOSE 172* 163*  --  97  BUN  29* 51*  --  35*  CREATININE 1.13 1.72* 0.60* 1.09  CALCIUM 9.6 9.1  --  8.4*   Liver Function Tests:  Recent Labs Lab  10/19/16 1150 10/21/16 0914  AST 29 38  ALT 39 45  ALKPHOS 74 59  BILITOT 0.7 0.7  PROT 9.0* 8.2*  ALBUMIN 4.0 3.5   No results for input(s): LIPASE, AMYLASE in the last 168 hours. No results for input(s): AMMONIA in the last 168 hours. CBC:  Recent Labs Lab 10/19/16 1150 10/21/16 0700 10/21/16 0914 10/22/16 0612  WBC 11.5* 14.5* 16.4* 10.6*  NEUTROABS 9.7* 11.4* 13.0*  --   HGB 16.4 15.2 15.4 13.1  HCT 50.2 46.5 47.0 40.7  MCV 92.1 92.3 92.5 92.9  PLT 349 334 347 254   Cardiac Enzymes:  Recent Labs Lab 10/21/16 0914 10/21/16 1940 10/22/16 0015 10/22/16 0612  TROPONINI 0.06* 0.05* 0.05* 0.03*   BNP: Invalid input(s): POCBNP CBG: No results for input(s): GLUCAP in the last 168 hours. D-Dimer No results for input(s): DDIMER in the last 72 hours. Hgb A1c No results for input(s): HGBA1C in the last 72 hours. Lipid Profile No results for input(s): CHOL, HDL, LDLCALC, TRIG, CHOLHDL, LDLDIRECT in the last 72 hours. Thyroid function studies No results for input(s): TSH, T4TOTAL, T3FREE, THYROIDAB in the last 72 hours.  Invalid input(s): FREET3 Anemia work up No results for input(s): VITAMINB12, FOLATE, FERRITIN, TIBC, IRON, RETICCTPCT in the last 72 hours. Urinalysis    Component Value Date/Time   COLORURINE YELLOW 10/20/2016 0230   APPEARANCEUR CLEAR 10/20/2016 0230   LABSPEC 1.025 10/20/2016 0230   PHURINE 5.5 10/20/2016 0230   GLUCOSEU NEGATIVE 10/20/2016 0230   HGBUR SMALL (A) 10/20/2016 0230   BILIRUBINUR NEGATIVE 10/20/2016 0230   KETONESUR TRACE (A) 10/20/2016 0230   PROTEINUR 100 (A) 10/20/2016 0230   NITRITE POSITIVE (A) 10/20/2016 0230   LEUKOCYTESUR NEGATIVE 10/20/2016 0230   Sepsis Labs Invalid input(s): PROCALCITONIN,  WBC,  LACTICIDVEN Microbiology Recent Results (from the past 240 hour(s))  Culture, Urine     Status: Abnormal   Collection Time: 10/20/16  2:30 AM  Result Value Ref Range Status   Specimen Description URINE, CLEAN CATCH   Final   Special Requests NONE  Final   Culture 60,000 COLONIES/mL CITROBACTER KOSERI (A)  Final   Report Status 10/22/2016 FINAL  Final   Organism ID, Bacteria CITROBACTER KOSERI (A)  Final      Susceptibility   Citrobacter koseri - MIC*    CEFAZOLIN >=64 RESISTANT Resistant     CEFTRIAXONE 32 INTERMEDIATE Intermediate     CIPROFLOXACIN >=4 RESISTANT Resistant     GENTAMICIN <=1 SENSITIVE Sensitive     IMIPENEM 1 SENSITIVE Sensitive     NITROFURANTOIN 64 INTERMEDIATE Intermediate     TRIMETH/SULFA <=20 SENSITIVE Sensitive     PIP/TAZO 32 INTERMEDIATE Intermediate     * 60,000 COLONIES/mL CITROBACTER KOSERI  Blood Culture (routine x 2)     Status: None (Preliminary result)   Collection Time: 10/21/16  9:14 AM  Result Value Ref Range Status   Specimen Description BLOOD RIGHT HAND  Final   Special Requests   Final    BOTTLES DRAWN AEROBIC AND ANAEROBIC Blood Culture adequate volume   Culture NO GROWTH 2 DAYS  Final   Report Status PENDING  Incomplete  Blood Culture (routine x 2)     Status: None (Preliminary result)   Collection Time: 10/21/16  9:27 AM  Result Value Ref Range Status   Specimen  Description BLOOD RIGHT FOREARM  Final   Special Requests   Final    BOTTLES DRAWN AEROBIC AND ANAEROBIC Blood Culture adequate volume   Culture NO GROWTH 2 DAYS  Final   Report Status PENDING  Incomplete  MRSA PCR Screening     Status: None   Collection Time: 10/21/16  4:43 PM  Result Value Ref Range Status   MRSA by PCR NEGATIVE NEGATIVE Final    Comment:        The GeneXpert MRSA Assay (FDA approved for NASAL specimens only), is one component of a comprehensive MRSA colonization surveillance program. It is not intended to diagnose MRSA infection nor to guide or monitor treatment for MRSA infections.      Time coordinating discharge: 40 minutes  SIGNED:   Katrinka Blazing, MD  [facility] 10/23/2016, 3:39 PM Pager [phone] If 7PM-7AM, please contact  night-coverage [email or web address] Password [email or web address]

## 2016-10-23 NOTE — Clinical Social Work Note (Signed)
[facility] Report number given to [employer] [facility]. [facility]. CSW is signing off as no further needs identified.  Corlis HoveJeneya Briya Lookabaugh, LCSWA, LCASA Weekend Social Work [phone]

## 2016-10-25 ENCOUNTER — Encounter (HOSPITAL_COMMUNITY)
Admission: RE | Disposition: A | Discharge status: Home / Self Care | Payer: Self-pay | Source: Ambulatory Visit | Attending: Internal Medicine

## 2016-10-25 ENCOUNTER — Encounter: Payer: Self-pay | Admitting: Internal Medicine

## 2016-10-25 ENCOUNTER — Encounter (HOSPITAL_COMMUNITY)
Admission: RE | Admit: 2016-10-25 | Discharge: 2016-10-25 | Disposition: A | Discharge status: Home / Self Care | Payer: Medicare Other | Source: Skilled Nursing Facility | Attending: *Deleted | Admitting: *Deleted

## 2016-10-25 ENCOUNTER — Encounter (HOSPITAL_COMMUNITY): Payer: Self-pay | Admitting: *Deleted

## 2016-10-25 ENCOUNTER — Ambulatory Visit (HOSPITAL_COMMUNITY)
Admission: RE | Admit: 2016-10-25 | Discharge: 2016-10-25 | Disposition: A | Discharge status: Home / Self Care | Payer: Medicare Other | Source: Ambulatory Visit | Attending: Internal Medicine | Admitting: Internal Medicine

## 2016-10-25 ENCOUNTER — Other Ambulatory Visit (INDEPENDENT_AMBULATORY_CARE_PROVIDER_SITE_OTHER): Payer: Self-pay | Admitting: Internal Medicine

## 2016-10-25 ENCOUNTER — Non-Acute Institutional Stay (SKILLED_NURSING_FACILITY): Payer: Medicare Other | Admitting: Internal Medicine

## 2016-10-25 DIAGNOSIS — K760 Fatty (change of) liver, not elsewhere classified: Secondary | ICD-10-CM | POA: Insufficient documentation

## 2016-10-25 DIAGNOSIS — R140 Abdominal distension (gaseous): Secondary | ICD-10-CM | POA: Diagnosis not present

## 2016-10-25 DIAGNOSIS — I69359 Hemiplegia and hemiparesis following cerebral infarction affecting unspecified side: Secondary | ICD-10-CM | POA: Insufficient documentation

## 2016-10-25 DIAGNOSIS — E876 Hypokalemia: Secondary | ICD-10-CM | POA: Diagnosis not present

## 2016-10-25 DIAGNOSIS — K562 Volvulus: Secondary | ICD-10-CM | POA: Insufficient documentation

## 2016-10-25 DIAGNOSIS — Z7901 Long term (current) use of anticoagulants: Secondary | ICD-10-CM

## 2016-10-25 DIAGNOSIS — I10 Essential (primary) hypertension: Secondary | ICD-10-CM

## 2016-10-25 DIAGNOSIS — I2782 Chronic pulmonary embolism: Secondary | ICD-10-CM

## 2016-10-25 DIAGNOSIS — Z79899 Other long term (current) drug therapy: Secondary | ICD-10-CM | POA: Insufficient documentation

## 2016-10-25 DIAGNOSIS — Z9889 Other specified postprocedural states: Secondary | ICD-10-CM | POA: Insufficient documentation

## 2016-10-25 DIAGNOSIS — K429 Umbilical hernia without obstruction or gangrene: Secondary | ICD-10-CM | POA: Diagnosis not present

## 2016-10-25 DIAGNOSIS — K598 Other specified functional intestinal disorders: Secondary | ICD-10-CM | POA: Diagnosis not present

## 2016-10-25 DIAGNOSIS — Z7952 Long term (current) use of systemic steroids: Secondary | ICD-10-CM | POA: Insufficient documentation

## 2016-10-25 DIAGNOSIS — K6389 Other specified diseases of intestine: Secondary | ICD-10-CM | POA: Diagnosis not present

## 2016-10-25 DIAGNOSIS — Z86711 Personal history of pulmonary embolism: Secondary | ICD-10-CM | POA: Insufficient documentation

## 2016-10-25 DIAGNOSIS — J441 Chronic obstructive pulmonary disease with (acute) exacerbation: Secondary | ICD-10-CM

## 2016-10-25 DIAGNOSIS — R1900 Intra-abdominal and pelvic swelling, mass and lump, unspecified site: Secondary | ICD-10-CM | POA: Diagnosis not present

## 2016-10-25 DIAGNOSIS — I2699 Other pulmonary embolism without acute cor pulmonale: Secondary | ICD-10-CM | POA: Diagnosis not present

## 2016-10-25 DIAGNOSIS — K624 Stenosis of anus and rectum: Secondary | ICD-10-CM | POA: Diagnosis not present

## 2016-10-25 DIAGNOSIS — Z880 Allergy status to penicillin: Secondary | ICD-10-CM | POA: Insufficient documentation

## 2016-10-25 DIAGNOSIS — K5939 Other megacolon: Secondary | ICD-10-CM | POA: Diagnosis not present

## 2016-10-25 DIAGNOSIS — K56699 Other intestinal obstruction unspecified as to partial versus complete obstruction: Secondary | ICD-10-CM | POA: Diagnosis not present

## 2016-10-25 DIAGNOSIS — E785 Hyperlipidemia, unspecified: Secondary | ICD-10-CM | POA: Diagnosis not present

## 2016-10-25 DIAGNOSIS — I69354 Hemiplegia and hemiparesis following cerebral infarction affecting left non-dominant side: Secondary | ICD-10-CM | POA: Diagnosis not present

## 2016-10-25 HISTORY — PX: FLEXIBLE SIGMOIDOSCOPY: SHX5431

## 2016-10-25 LAB — PROTIME-INR
INR: 1.75
Prothrombin Time: 20.7 seconds — ABNORMAL HIGH (ref 11.4–15.2)

## 2016-10-25 SURGERY — SIGMOIDOSCOPY, FLEXIBLE
Anesthesia: Moderate Sedation

## 2016-10-25 MED ORDER — STERILE WATER FOR IRRIGATION IR SOLN
Status: DC | PRN
  Administered 2016-10-25: 18:00:00

## 2016-10-25 NOTE — Progress Notes (Signed)
This encounter was created in error - please disregard.

## 2016-10-25 NOTE — Progress Notes (Addendum)
Provider: Einar Crow  Location:   [facility] Room Number: 112/W Place of Service:  SNF (31)  PCP: Mahlon Gammon, MD [name], [name]  Extended Emergency Contact Information Primary Emergency Contact: [name] Address: [address]          [place], [place] [address] [place] of [place] Home Phone: [phone] Mobile Phone: [phone] Relation: Brother  Code Status: Full Code Goals of Care: Advanced Directive information Advanced Directives 10/25/2016  Does Patient Have a Medical Advance Directive? Yes  Type of Advance Directive (No Data)  Does patient want to make changes to medical advance directive? No - Patient declined  Copy of Healthcare Power of Attorney in Chart? -      Chief Complaint  Patient presents with  . Readmit To SNF    HPI: Patient is a 71 y.o. male seen today for Readmission to SNF for Long term Placement Patient is Long term resident of facility.  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 Hypertension and COPD.He has been on Chronic Coumadin since 2014 and has been continued due to his CVA .  Patient was evaluated initially in the facility for COPD . He was started on Antibiotics and Prednisone. But he started having Distension and abdominal discomfort with worsening SOB. Patient had CT scan done which showed Sigmoid Volvulus. GI was consulted and they were able to Decompress the Colon done by Dr Karilyn Cota without him needing surgery. Patient also had KUB which showed dilated loops of Bowel. 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 Patient also had some problems with swallowing in the hospital and now is on Dysphagia diet. He is also on Antibiotics for ? UTI. Patient is sitting in the wheelchair. Denies any Nausea or  vomiting. Ate very good breakfast this morning. He also had bowel movement this morning. He says his breathing is better. But continous to have cough with mucus.     Past Medical History:  Diagnosis Date  . Dysphasia   . Fatty liver   . Hemiplegia (HCC)   . Hypertension   . Pneumonia   . Pulmonary embolism (HCC)   . Stroke [facility])    Past Surgical History:  Procedure Laterality Date  . FLEXIBLE SIGMOIDOSCOPY N/A 10/21/2016   Procedure: FLEXIBLE SIGMOIDOSCOPY;  Surgeon: Malissa Hippo, MD;  Location: [facility];  Service: Endoscopy;  Laterality: N/A;  . unable      reports that he has never smoked. He has never used smokeless tobacco. He reports that he does not drink alcohol or use drugs. Social History   Social History  . Marital status: Single    Spouse name: N/A  . Number of children: N/A  . Years of education: N/A   Occupational History  . Not on file.   Social History Main Topics  . Smoking status: Never Smoker  . Smokeless tobacco: Never Used  . Alcohol use No  . Drug use: No  . Sexual activity: Not on file   Other Topics Concern  . Not on file   Social History Narrative  . No narrative on file    Functional Status Survey:    History reviewed. No pertinent family history.  Health Maintenance  Topic Date Due  . COLONOSCOPY  10/30/2025 (Originally 11/08/1995)  .  TETANUS/TDAP  10/30/2025 (Originally 11/07/1964)  . INFLUENZA VACCINE  01/19/2017  . PNA vac Low Risk Adult (2 of 2 - PCV13) 03/30/2017  . Hepatitis C Screening  Completed    Allergies  Allergen Reactions  . Penicillins     Has patient had a PCN reaction causing immediate rash, facial/tongue/throat swelling, SOB or lightheadedness with hypotension: unknown Has patient had a PCN reaction causing severe rash involving mucus membranes or skin necrosis: unknown Has patient had a PCN reaction that required hospitalization: unknown Has patient had a PCN reaction occurring within the last 10  years: unknown If all of the above answers are "NO", then may proceed with Cephalosporin use. 5/3 Tolerated Cefepime x 1 dose in ED.     Allergies as of 10/25/2016      Reactions   Penicillins    Has patient had a PCN reaction causing immediate rash, facial/tongue/throat swelling, SOB or lightheadedness with hypotension: unknown Has patient had a PCN reaction causing severe rash involving mucus membranes or skin necrosis: unknown Has patient had a PCN reaction that required hospitalization: unknown Has patient had a PCN reaction occurring within the last 10 years: unknown If all of the above answers are "NO", then may proceed with Cephalosporin use. 5/3 Tolerated Cefepime x 1 dose in ED.      Medication List       Accurate as of 10/25/16 10:31 AM. Always use your most recent med list.          acetaminophen 325 MG tablet Commonly known as:  TYLENOL Take 650 mg by mouth every 6 (six) hours as needed for moderate pain.   betamethasone dipropionate 0.05 % cream Commonly known as:  DIPROLENE Apply 1 application topically 2 (two) times daily. Apply to legs and hands NOT TO FACE, GROIN,AXILLA TRY CERAVAE FIRST twice  day   CERAVE [facility] RELIEF EX Apply 1 application topically daily as needed (itching).   ipratropium 0.02 % nebulizer solution Commonly known as:  ATROVENT Take 0.5 mg by nebulization every 6 (six) hours.   labetalol 200 MG tablet Commonly known as:  NORMODYNE Take 200 mg by mouth daily. For HTN   lisinopril 20 MG tablet Commonly known as:  PRINIVIL,ZESTRIL Take 20 mg by mouth daily.   loratadine 10 MG tablet Commonly known as:  CLARITIN Take 10 mg by mouth daily.   polyethylene glycol packet Commonly known as:  MIRALAX / GLYCOLAX Take 17 g by mouth daily.   polyvinyl alcohol 1.4 % ophthalmic solution Commonly known as:  LIQUIFILM TEARS Place 1 drop into both eyes 2 (two) times daily.   predniSONE 20 MG tablet Commonly known as:  DELTASONE Take 40 mg by  mouth daily with breakfast.   ROBITUSSIN DM PO Take 15 mLs by mouth every 6 (six) hours as needed (cough).   sennosides-docusate sodium 8.6-50 MG tablet Commonly known as:  SENOKOT-S Take 1 tablet by mouth at bedtime. For constipation   sulfamethoxazole-trimethoprim 200-40 MG/5ML suspension Commonly known as:  BACTRIM,SEPTRA Take 20 mLs by mouth every 12 (twelve) hours.   SYSTANE LID WIPES EX Apply 1 application topically 2 (two) times daily. Start occusoft lid scrubs: clean eyelids of oil and debris BID OU ( each eye ) before administering eye drops.   SYSTANE NIGHTTIME Oint Place 1 application into the left eye at bedtime. Apply to left eye at night   warfarin 2 MG tablet Commonly known as:  COUMADIN Take 1 tablet (2 mg total) by mouth daily at 6 PM.  Give 2 mg by mouth once a day       Review of Systems  Review of Systems  Constitutional: Negative for activity change, appetite change, chills, diaphoresis, fatigue and fever.  HENT: Negative for mouth sores, postnasal drip, rhinorrhea, sinus pain and sore throat.   Respiratory: Negative for apnea, chest tightness, shortness of breath and wheezing.   Cardiovascular: Negative for chest pain, palpitations and leg swelling.  Gastrointestinal: Negative for  constipation, diarrhea, nausea and vomiting.  Genitourinary: Negative for dysuria and frequency.  Musculoskeletal: Negative for arthralgias, joint swelling and myalgias.  Skin: Negative for rash.  Neurological: Negative for dizziness, syncope, weakness, light-headedness and numbness.  Psychiatric/Behavioral: Negative for behavioral problems, confusion and sleep disturbance.     Vitals:   10/25/16 1002  BP: (!) 153/95  Pulse: 99  Resp: (!) 24  Temp: 98.5 F (36.9 C)  TempSrc: Oral  SpO2: 94%   There is no height or weight on file to calculate BMI. Physical Exam  Constitutional: He appears well-developed and well-nourished.  HENT:  Head: Normocephalic.    Mouth/Throat: Oropharynx is clear and moist.  Has Facial droop.  Eyes: Right eye exhibits no discharge. Left eye exhibits no discharge.  Cardiovascular: Normal rate, regular rhythm and normal heart sounds.   Pulmonary/Chest: Effort normal.  Pateint Contiue to have b/l rales and Expiratory Wheezing.  Abdominal:  His Abdomen stays distended. Has some lower abdomen tenderness. BS present.  Musculoskeletal: He exhibits no edema.  Neurological: He is alert.  Continues to have left Hemapheresis and facial droop.  Skin: Skin is warm and dry. No rash noted. No erythema.  Psychiatric: He has a normal mood and affect. His behavior is normal. Thought content normal.    Labs reviewed: Basic Metabolic Panel:  Recent Labs  86/57/8403/07/09 1150 10/21/16 0914 10/21/16 1401 10/22/16 0612  NA 140 141  --  144  K 4.3 3.7  --  4.1  CL 103 106  --  112*  CO2 25 26  --  26  GLUCOSE 172* 163*  --  97  BUN 29* 51*  --  35*  CREATININE 1.13 1.72* 0.60* 1.09  CALCIUM 9.6 9.1  --  8.4*   Liver Function Tests:  Recent Labs  07/28/16 0700 10/19/16 1150 10/21/16 0914  AST 23 29 38  ALT 29 39 45  ALKPHOS 59 74 59  BILITOT 0.4 0.7 0.7  PROT 7.0 9.0* 8.2*  ALBUMIN 3.3* 4.0 3.5   No results for input(s): LIPASE, AMYLASE in the last 8760 hours. No results for input(s): AMMONIA in the last 8760 hours. CBC:  Recent Labs  10/19/16 1150 10/21/16 0700 10/21/16 0914 10/22/16 0612  WBC 11.5* 14.5* 16.4* 10.6*  NEUTROABS 9.7* 11.4* 13.0*  --   HGB 16.4 15.2 15.4 13.1  HCT 50.2 46.5 47.0 40.7  MCV 92.1 92.3 92.5 92.9  PLT 349 334 347 254   Cardiac Enzymes:  Recent Labs  10/21/16 1940 10/22/16 0015 10/22/16 0612  TROPONINI 0.05* 0.05* 0.03*   BNP: Invalid input(s): POCBNP Lab Results  Component Value Date   HGBA1C  03/27/2009    6.0 (NOTE) The ADA recommends the following therapeutic goal for glycemic control related to Hgb A1c measurement: Goal of therapy: <6.5 Hgb A1c  Reference:  American Diabetes Association: Clinical Practice Recommendations 2010, Diabetes Care, 2010, 33: (Suppl  1).   Lab Results  Component Value Date   TSH 1.285 12/19/2015   No results found for: VITAMINB12 No results found for: FOLATE No  results found for: IRON, TIBC, FERRITIN  Imaging and Procedures obtained prior to SNF admission: No results found.  Assessment/Plan Volvulus  Patient continues to have distended Abdomen.  Will repeat Xray of abdomen. He is suppose to be using Bedside commode which he is.  H/O PE and DVT On coumadin INR was 1.75 He is back on his Baseline dose. Would not make any changes to his dose. Follow his INR.   COPD  Patient continues to have expiratory wheezing. Continue Duo Neb Will taper his steroid.  UTI He is on Septra for 3 more days.  Essential hypertension BP mildly elevated. But continue Labetalol and Lisinopril. Acute renal insufficiency BUN and Creat coming to baseline.will follow labs Dyphagia D/W the Speech therapy. At this time he is on highest Dysphagic diet and according to her he has not had any problems with Dysphagia.And at this time for him there is nothing else can be done with his Oral diet .So will not do Barium swallow and continue to monitor him clinically.     Family/ staff Communication:   Labs/tests ordered: Xray of abdomen, CBC,BMP, INR  Total time spent in this patient care encounter was 45_ minutes; greater than 50% of the visit spent counseling patient and coordinating care for problems addressed at this encounter.

## 2016-10-25 NOTE — Op Note (Signed)
[facility] Patient Name: [name] [name] Procedure Date: 10/25/2016 5:09 PM MRN: [id number] Date of Birth: May 14, 1946 Attending MD: Lionel December , MD CSN: [id number] Age: 71 Admit Type: Inpatient Procedure:                Flexible Sigmoidoscopy Indications:              For therapy of sigmoid volvulus Providers:                Lionel December, MD, Nena Polio, RN, Ina Homes,                            Technician Referring MD:             Einar Crow, MD Medicines:                None Complications:            No immediate complications. Estimated Blood Loss:     Estimated blood loss: none. Procedure:                Pre-Anesthesia Assessment:                           - Prior to the procedure, a History and Physical                            was performed, and patient medications and                            allergies were reviewed. The patient's tolerance of                            previous anesthesia was also reviewed. The risks                            and benefits of the procedure and the sedation                            options and risks were discussed with the patient.                            All questions were answered, and informed consent                            was obtained. Prior Anticoagulants: The patient                            last took Coumadin (warfarin) on the day of the                            procedure. ASA Grade Assessment: III - A patient                            with severe systemic disease. After reviewing the  risks and benefits, the patient was deemed in                            satisfactory condition to undergo the procedure.                           After obtaining informed consent, the scope was                            passed under direct vision. The EC-[id number]                            ([id number]) scope was introduced through the anus                            and advanced to the the  descending colon. The                            flexible sigmoidoscopy was accomplished without                            difficulty. The patient tolerated the procedure                            well. The quality of the bowel preparation was and                            prepped exam. Scope In: 5:46:04 PM Scope Out: 5:52:51 PM Total Procedure Duration: 0 hours 6 minutes 47 seconds  Findings:      The perianal and digital rectal examinations were normal.      A volvulus, with viable appearing mucosa, was found in the recto-sigmoid       colon. Decompression of the volvulus was attempted and was successful,       with complete decompression achieved. No biopsies or other specimens       were collected for this exam.      The lumen of the sigmoid colon was significantly dilated. Impression:               - Volvulus. Successful complete decompression                            achieved. No specimens collected.                           - Dilated in the sigmoid colon. Moderate Sedation:      none Recommendation:           - Return patient to [facility] for ongoing care.                           - Continue present medications.                           - Resume Coumadin (warfarin) at prior dose today.                           -  Post sigmoid volvulus decompression KUB.                           - Rectal tube every night for one hour for 1 week.                           Comment: Patient is at increased risk for recurrent                            volvulus. Procedure Code(s):        --- Professional ---                           [phone], Sigmoidoscopy, flexible; with decompression                            (for pathologic distention) (eg, volvulus,                            megacolon), including placement of decompression                            tube, when performed Diagnosis Code(s):        --- Professional ---                           K56.2, Volvulus                            K59.39, Other megacolon CPT copyright 2016 American Medical Association. All rights reserved. The codes documented in this report are preliminary and upon coder review may  be revised to meet current compliance requirements. Lionel DecemberNajeeb Rehman, MD Lionel DecemberNajeeb Rehman, MD 10/25/2016 6:06:13 PM This report has been signed electronically. Number of Addenda: 0

## 2016-10-25 NOTE — Discharge Instructions (Signed)
Resume usual diet and medications. Rectal tube every evening for 1 hour for 1 week. Encouraged patient to pass flatus. Continue turning patient in bed every 2-3 hours and assist to bedside commode at least twice daily..   Flexible Sigmoidoscopy, Care After This sheet gives you information about how to care for yourself after your procedure. Your health care provider may also give you more specific instructions. If you have problems or questions, contact your health care provider. What can I expect after the procedure? After the procedure, it is common to have:  Abdominal cramping or pain.  Bloating.  A small amount of rectal bleeding if you had a biopsy. Follow these instructions at home:  Take over-the-counter and prescription medicines only as told by your health care provider.  Do not drive for 24 hours if you received a medicine to help you relax (sedative).  Keep all follow-up visits as told by your health care provider. This is important. Contact a health care provider if:  You have abdominal pain or cramping that gets worse or is not helped with medicine.  You continue to have small amounts of rectal bleeding after 24 hours.  You have nausea or vomiting.  You feel weak or dizzy.  You have a fever. Get help right away if:  You pass large blood clots or see a large amount of blood in the toilet after having a bowel movement.  You have nausea or vomiting for more than 24 hours after the procedure.

## 2016-10-25 NOTE — H&P (Addendum)
[name] [name] is an 71 y.o. male.   Chief Complaint: Patient is here for flexible sigmoidoscopy to decompress sigmoid volvulus. HPI: Patient is 71 year old African-American male with history of CVA and limited physically activity was admitted this facility last week with sigmoid volvulus. It was easily decompressed on 10/21/2016. He did fine for next 2 days and was discharged back to nursing home two days ago. Today he was noted to have a distended abdomen. He has been passing liquid stools but no flatus. Nursing staff have been turning over in bed and also get him on bedside commode twice a day. He ate maybe 50% of his breakfast and on office lunch. She has not had nausea or vomiting. He denies abdominal pain but he complains of back pain. Patient is anticoagulated.   Past Medical History:  Diagnosis Date  . Dysphasia   . Fatty liver   . Hemiplegia (HCC)   . Hypertension   . Pneumonia   . Pulmonary embolism (HCC)   . Stroke [facility])     Past Surgical History:  Procedure Laterality Date  . FLEXIBLE SIGMOIDOSCOPY N/A 10/21/2016   Procedure: FLEXIBLE SIGMOIDOSCOPY;  Surgeon: Malissa Hippo, MD;  [facility];  Service: Endoscopy;  Laterality: N/A;  . unable      No family history on file. Social History:  reports that he has never smoked. He has never used smokeless tobacco. He reports that he does not drink alcohol or use drugs.  Allergies:  Allergies  Allergen Reactions  . Penicillins     Has patient had a PCN reaction causing immediate rash, facial/tongue/throat swelling, SOB or lightheadedness with hypotension: unknown Has patient had a PCN reaction causing severe rash involving mucus membranes or skin necrosis: unknown Has patient had a PCN reaction that required hospitalization: unknown Has patient had a PCN reaction occurring within the last 10 years: unknown If all of the above answers are "NO", then may proceed with Cephalosporin use. 5/3 Tolerated Cefepime x 1  dose in ED.     Medications Prior to Admission  Medication Sig Dispense Refill  . acetaminophen (TYLENOL) 325 MG tablet Take 650 mg by mouth every 6 (six) hours as needed for moderate pain.     Marland Kitchen warfarin (COUMADIN) 2 MG tablet Take 1 tablet (2 mg total) by mouth daily at 6 PM. Give 2 mg by mouth once a day    . betamethasone dipropionate (DIPROLENE) 0.05 % cream Apply 1 application topically 2 (two) times daily. Apply to legs and hands NOT TO FACE, GROIN,AXILLA TRY CERAVAE FIRST twice  day     . Dextromethorphan-Guaifenesin (ROBITUSSIN DM PO) Take 15 mLs by mouth every 6 (six) hours as needed (cough).     . Eyelid Cleansers (SYSTANE LID WIPES EX) Apply 1 application topically 2 (two) times daily. Start occusoft lid scrubs: clean eyelids of oil and debris BID OU ( each eye ) before administering eye drops.     Marland Kitchen ipratropium (ATROVENT) 0.02 % nebulizer solution Take 0.5 mg by nebulization every 6 (six) hours.    Marland Kitchen labetalol (NORMODYNE) 200 MG tablet Take 200 mg by mouth daily. For HTN    . lisinopril (PRINIVIL,ZESTRIL) 20 MG tablet Take 20 mg by mouth daily.    Marland Kitchen loratadine (CLARITIN) 10 MG tablet Take 10 mg by mouth daily.    . polyethylene glycol (MIRALAX / GLYCOLAX) packet Take 17 g by mouth daily. 14 each 0  . polyvinyl alcohol (LIQUIFILM TEARS) 1.4 % ophthalmic solution Place  1 drop into both eyes 2 (two) times daily.    . Pramoxine HCl (CERAVE ITCH RELIEF EX) Apply 1 application topically daily as needed (itching).    . predniSONE (DELTASONE) 20 MG tablet Take 40 mg by mouth daily with breakfast.    . sennosides-docusate sodium (SENOKOT-S) 8.6-50 MG tablet Take 1 tablet by mouth at bedtime. For constipation    . sulfamethoxazole-trimethoprim (BACTRIM,SEPTRA) 200-40 MG/5ML suspension Take 20 mLs by mouth every 12 (twelve) hours. 100 mL 0  . White Petrolatum-Mineral Oil (SYSTANE NIGHTTIME) OINT Place 1 application into the left eye at bedtime. Apply to left eye at night       Results for  orders placed or performed during the hospital encounter of 10/25/16 (from the past 48 hour(s))  Protime-INR     Status: Abnormal   Collection Time: 10/25/16  7:00 AM  Result Value Ref Range   Prothrombin Time 20.7 (H) 11.4 - 15.2 seconds   INR 1.75    No results found.  ROS  There were no vitals taken for this visit. Physical Exam  Constitutional: He appears well-developed and well-nourished.  HENT:  He has drooping of left eyelid. He has left-sided facial weakness.  Eyes: Conjunctivae are normal. No scleral icterus.  Neck: No thyromegaly present.  Cardiovascular: Normal rate, regular rhythm and normal heart sounds.   No murmur heard. Respiratory: Effort normal and breath sounds normal.  Breath sounds diminished at bases but no rales or rhonchi.  GI:  Abdomen is markedly distended with small umbilical hernia. Percussion note is very tympanitic but it is nontender. Bowel sounds are hypoactive.  Musculoskeletal: He exhibits edema.  He has 1+ pitting edema to his left slightly more on the left side.  Lymphadenopathy:    He has no cervical adenopathy.  Neurological: He is alert.  Patient has dysarthria and his speech is somewhat difficult to comprehend.  Skin: Skin is warm and dry.     Assessment/Plan Sigmoid volvulus. Second episode. Patient also has underlying medical. Flexible sigmoidoscopy via decompression of sigmoid volvulus.  Patient's condition reviewed with his brother Gabriel RungJoe over the phone.  Lionel DecemberNajeeb Melford Tullier, MD 10/25/2016, 5:30 PM

## 2016-10-26 ENCOUNTER — Encounter: Payer: Self-pay | Admitting: Internal Medicine

## 2016-10-26 LAB — CULTURE, BLOOD (ROUTINE X 2)
CULTURE: NO GROWTH
Culture: NO GROWTH
Special Requests: ADEQUATE
Special Requests: ADEQUATE

## 2016-10-26 NOTE — Progress Notes (Signed)
Location:   [facility] Room Number: 112/W Place of Service:  SNF [phone]) Provider:  Lenon CurtAnjali,Gupta  Gupta, Freddie BreechAnjali L, MD  Patient Care Team: Mahlon GammonGupta, Anjali L, [name]  Extended Emergency Contact Information Primary Emergency Contact: Venetia MaxonWalker,[name] Jr Address: [address]          [place], [place] [address] [place] Home Phone: [phone] Mobile Phone: [phone] Relation: Brother  Code Status:  Full Code Goals of care: Advanced Directive information Advanced Directives 10/26/2016  Does Patient Have a Medical Advance Directive? Yes  Type of Advance Directive (No Data)  Does patient want to make changes to medical advance directive? No - Patient declined  Copy of Healthcare Power of Attorney in Chart? -     Chief Complaint  Patient presents with  . Acute Visit    F/U from ER visit    HPI:  Pt is a 71 y.o. male seen today for an acute visit for    Past Medical History:  Diagnosis Date  . Dysphasia   . Fatty liver   . Hemiplegia (HCC)   . Hypertension   . Pneumonia   . Pulmonary embolism (HCC)   . Stroke [facility](HCC)    Past Surgical History:  Procedure Laterality Date  . FLEXIBLE SIGMOIDOSCOPY N/A 10/21/2016   Procedure: FLEXIBLE SIGMOIDOSCOPY;  Surgeon: Malissa Hippoehman, Najeeb U, MD;  Location: [facility];  Service: Endoscopy;  Laterality: N/A;  . unable      Allergies  Allergen Reactions  . Penicillins     Has patient had a PCN reaction causing immediate rash, facial/tongue/throat swelling, SOB or lightheadedness with hypotension: unknown Has patient had a PCN reaction causing severe rash involving mucus membranes or skin necrosis: unknown Has patient had a PCN reaction that required hospitalization: unknown Has patient had a PCN reaction occurring within the last 10 years: unknown If all of the above answers are "NO", then may proceed with Cephalosporin use. 5/3 Tolerated Cefepime x 1 dose in ED.      Outpatient Encounter Prescriptions as of 10/26/2016  Medication Sig  . acetaminophen (TYLENOL) 325 MG tablet Take 650 mg by mouth every 6 (six) hours as needed for moderate pain.   Marland Kitchen. betamethasone dipropionate (DIPROLENE) 0.05 % cream Apply 1 application topically 2 (two) times daily. Apply to legs and hands NOT TO FACE, GROIN,AXILLA TRY CERAVAE FIRST twice  day   . Dextromethorphan-Guaifenesin (ROBITUSSIN DM PO) Take 15 mLs by mouth every 6 (six) hours as needed (cough).   . Emollient (CERAVE) CREA Apply 1 application topically daily as needed for itching once a day  . Eyelid Cleansers (SYSTANE LID WIPES EX) Apply 1 application topically 2 (two) times daily. Start occusoft lid scrubs: clean eyelids of oil and debris BID OU ( each eye ) before administering eye drops.   Marland Kitchen. ipratropium (ATROVENT) 0.02 % nebulizer solution Take 0.5 mg by nebulization every 6 (six) hours.  Marland Kitchen. labetalol (NORMODYNE) 200 MG tablet Take 200 mg by mouth daily. For HTN  . lisinopril (PRINIVIL,ZESTRIL) 20 MG tablet Take 20 mg by mouth daily.  Marland Kitchen. loratadine (CLARITIN) 10 MG tablet Take 10 mg by mouth daily.  . polyethylene glycol (MIRALAX / GLYCOLAX) packet Take 17 g by mouth daily.  . polyvinyl alcohol (LIQUIFILM TEARS) 1.4 % ophthalmic solution Place 1 drop into both eyes 2 (two) times daily.  . predniSONE (DELTASONE) 20 MG tablet Take 40 mg by mouth daily with breakfast.  . sennosides-docusate sodium (  SENOKOT-S) 8.6-50 MG tablet Take 1 tablet by mouth at bedtime. For constipation  . sulfamethoxazole-trimethoprim (BACTRIM,SEPTRA) 200-40 MG/5ML suspension Take 20 mLs by mouth every 12 (twelve) hours.  Marland Kitchen warfarin (COUMADIN) 2 MG tablet Take 1 tablet (2 mg total) by mouth daily at 6 PM. Give 2 mg by mouth once a day  . White Petrolatum-Mineral Oil (SYSTANE NIGHTTIME) OINT Place 1 application into the left eye at bedtime. Apply to left eye at night   . [DISCONTINUED] Pramoxine HCl (CERAVE ITCH RELIEF EX) Apply 1 application  topically daily as needed (itching).   No facility-administered encounter medications on file as of 10/26/2016.     Review of Systems  Immunization History  Administered Date(s) Administered  . Influenza-Unspecified 03/28/2014, 03/23/2016  . Pneumococcal-Unspecified 11/01/2009, 03/30/2016   Pertinent  Health Maintenance Due  Topic Date Due  . COLONOSCOPY  10/30/2025 (Originally 11/08/1995)  . INFLUENZA VACCINE  01/19/2017  . PNA vac Low Risk Adult (2 of 2 - PCV13) 03/30/2017   No flowsheet data found. Functional Status Survey:    Vitals:   10/26/16 0854  BP: 122/77  Pulse: 92  Resp: 20  Temp: 97.6 F (36.4 C)  TempSrc: Oral  SpO2: 95%   There is no height or weight on file to calculate BMI. Physical Exam  Labs reviewed:  Recent Labs  10/19/16 1150 10/21/16 0914 10/21/16 1401 10/22/16 0612  NA 140 141  --  144  K 4.3 3.7  --  4.1  CL 103 106  --  112*  CO2 25 26  --  26  GLUCOSE 172* 163*  --  97  BUN 29* 51*  --  35*  CREATININE 1.13 1.72* 0.60* 1.09  CALCIUM 9.6 9.1  --  8.4*    Recent Labs  07/28/16 0700 10/19/16 1150 10/21/16 0914  AST 23 29 38  ALT 29 39 45  ALKPHOS 59 74 59  BILITOT 0.4 0.7 0.7  PROT 7.0 9.0* 8.2*  ALBUMIN 3.3* 4.0 3.5    Recent Labs  10/19/16 1150 10/21/16 0700 10/21/16 0914 10/22/16 0612  WBC 11.5* 14.5* 16.4* 10.6*  NEUTROABS 9.7* 11.4* 13.0*  --   HGB 16.4 15.2 15.4 13.1  HCT 50.2 46.5 47.0 40.7  MCV 92.1 92.3 92.5 92.9  PLT 349 334 347 254   Lab Results  Component Value Date   TSH 1.285 12/19/2015   Lab Results  Component Value Date   HGBA1C  03/27/2009    6.0 (NOTE) The ADA recommends the following therapeutic goal for glycemic control related to Hgb A1c measurement: Goal of therapy: <6.5 Hgb A1c  Reference: American Diabetes Association: Clinical Practice Recommendations 2010, Diabetes Care, 2010, 33: (Suppl  1).   Lab Results  Component Value Date   CHOL 117 07/28/2016   HDL 33 (L) 07/28/2016    LDLCALC 75 07/28/2016   TRIG 45 07/28/2016   CHOLHDL 3.5 07/28/2016    Significant Diagnostic Results in last 30 days:  Dg Abd 1 View  Result Date: 10/25/2016 CLINICAL DATA:  Status post sigmoid volvulus decompression. EXAM: ABDOMEN - 1 VIEW COMPARISON:  10/23/2016.  Scout image from abdomen CT of 10/31/2016. FINDINGS: Diffuse gaseous colonic distention persists but appears slightly improved in the interval. Degenerative changes noted lumbar spine. Bones are demineralized. Phleboliths are seen over the anatomic pelvis. IMPRESSION: Persistent but slightly decreased diffuse gaseous colonic dilatation. Electronically Signed   By: Kennith Center M.D.   On: 10/25/2016 18:34   Dg [facility] 1 View  Result  Date: 10/21/2016 CLINICAL DATA:  Shortness of breath and fevers EXAM: PORTABLE CHEST 1 VIEW COMPARISON:  03/20/2015 FINDINGS: Cardiac shadow is mildly enlarged but stable. Overall poor inspiratory effort is noted. Some basilar atelectasis is seen likely related to the poor inspiratory effort. No sizable effusion is seen. No bony abnormality is noted. IMPRESSION: Poor inspiratory effort with basilar atelectasis. Electronically Signed   By: Alcide Clever M.D.   On: 10/21/2016 09:47   Dg Abd Portable 1v  Result Date: 10/23/2016 CLINICAL DATA:  Followup sigmoid volvulus. EXAM: PORTABLE ABDOMEN - 1 VIEW COMPARISON:  None. FINDINGS: There is continued diffuse abnormal gaseous distension of the large bowel with marked gaseous distension of the sigmoid colon. No free intraperitoneal air identified. IMPRESSION: 1. Persistent abnormal distension of the bowel loops particularly the sigmoid colon. Recurrent sigmoid volvulus would be difficult to exclude based on plain film radiographs and if the patient is clinically obstructed consider repeat CT of the abdomen and pelvis. Electronically Signed   By: Signa Kell M.D.   On: 10/23/2016 10:24   Ct Renal Stone Study  Result Date: 10/21/2016 CLINICAL DATA:  Abdominal  distention and shortness of Breath EXAM: CT ABDOMEN AND PELVIS WITHOUT CONTRAST TECHNIQUE: Multidetector CT imaging of the abdomen and pelvis was performed following the standard protocol without IV contrast. COMPARISON:  12/28/2007 FINDINGS: Lower chest: Bibasilar consolidation is noted right greater than left. Hepatobiliary: With the exception of a small cyst in the right lobe of the liver the liver and gallbladder are within normal limits. Pancreas: Unremarkable. No pancreatic ductal dilatation or surrounding inflammatory changes. Spleen: Normal in size without focal abnormality. Adrenals/Urinary Tract: The adrenal glands are within normal limits. Renal vascular calcifications are noted. Mild fullness of the left collecting system is seen which may be related to edema from recently passed stone as no definitive stone is identified. The bladder is well distended. Stomach/Bowel: The colon is distended with air and fecal material. There is an area of focal narrowing in the sigmoid colon best seen on image number 54 of series 2 and image 53 of series 7. There are changes consistent with a mobile sigmoid and early sigmoid volvulus. The colon proximal to the area of narrowing in the sigmoid is significantly dilated. On the coronal imaging significant rotation of the sigmoid and its associated vascularity is noted. The appendix is within normal limits. No other bowel abnormality is seen. Vascular/Lymphatic: Aortic atherosclerosis. No enlarged abdominal or pelvic lymph nodes. Reproductive: Prostate is unremarkable. Other: Small fat containing umbilical hernia is noted. Musculoskeletal: Degenerative changes of lumbar spine are seen. No acute bony abnormality is noted. IMPRESSION: Changes consistent with sigmoid volume ileus and proximal colonic obstructive change. The distal most: Beyond the volvulus is decompressed. Surgical consultation is recommended. Mild fullness of the left renal collecting system without definitive  stone. These results were called by telephone at the time of interpretation on 10/21/2016 at 11:32 am to Dr. Marily Memos , who verbally acknowledged these results. Electronically Signed   By: Alcide Clever M.D.   On: 10/21/2016 11:33    Assessment/Plan There are no diagnoses linked to this encounter.   Family/ staff Communication:   Labs/tests ordered:     This encounter was created in error - please disregard.

## 2016-10-27 ENCOUNTER — Inpatient Hospital Stay (HOSPITAL_COMMUNITY)
Admission: EM | Admit: 2016-10-27 | Discharge: 2016-10-31 | DRG: 388 | Disposition: A | Discharge status: Skilled Nursing Facility | Payer: Medicare Other | Attending: Internal Medicine | Admitting: Internal Medicine

## 2016-10-27 ENCOUNTER — Emergency Department (HOSPITAL_COMMUNITY): Payer: Medicare Other

## 2016-10-27 ENCOUNTER — Encounter (HOSPITAL_COMMUNITY): Payer: Self-pay | Admitting: Emergency Medicine

## 2016-10-27 DIAGNOSIS — Z79899 Other long term (current) drug therapy: Secondary | ICD-10-CM | POA: Diagnosis not present

## 2016-10-27 DIAGNOSIS — K429 Umbilical hernia without obstruction or gangrene: Secondary | ICD-10-CM | POA: Diagnosis present

## 2016-10-27 DIAGNOSIS — I639 Cerebral infarction, unspecified: Secondary | ICD-10-CM | POA: Diagnosis present

## 2016-10-27 DIAGNOSIS — K624 Stenosis of anus and rectum: Secondary | ICD-10-CM | POA: Diagnosis not present

## 2016-10-27 DIAGNOSIS — R140 Abdominal distension (gaseous): Secondary | ICD-10-CM | POA: Diagnosis not present

## 2016-10-27 DIAGNOSIS — I679 Cerebrovascular disease, unspecified: Secondary | ICD-10-CM

## 2016-10-27 DIAGNOSIS — I2699 Other pulmonary embolism without acute cor pulmonale: Secondary | ICD-10-CM | POA: Diagnosis present

## 2016-10-27 DIAGNOSIS — K6389 Other specified diseases of intestine: Secondary | ICD-10-CM | POA: Diagnosis not present

## 2016-10-27 DIAGNOSIS — I10 Essential (primary) hypertension: Secondary | ICD-10-CM | POA: Diagnosis present

## 2016-10-27 DIAGNOSIS — I69354 Hemiplegia and hemiparesis following cerebral infarction affecting left non-dominant side: Secondary | ICD-10-CM | POA: Diagnosis not present

## 2016-10-27 DIAGNOSIS — Z880 Allergy status to penicillin: Secondary | ICD-10-CM

## 2016-10-27 DIAGNOSIS — E876 Hypokalemia: Secondary | ICD-10-CM | POA: Diagnosis not present

## 2016-10-27 DIAGNOSIS — Z86711 Personal history of pulmonary embolism: Secondary | ICD-10-CM | POA: Diagnosis not present

## 2016-10-27 DIAGNOSIS — E785 Hyperlipidemia, unspecified: Secondary | ICD-10-CM | POA: Diagnosis present

## 2016-10-27 DIAGNOSIS — Z7901 Long term (current) use of anticoagulants: Secondary | ICD-10-CM | POA: Diagnosis not present

## 2016-10-27 DIAGNOSIS — Z993 Dependence on wheelchair: Secondary | ICD-10-CM | POA: Diagnosis not present

## 2016-10-27 DIAGNOSIS — K56699 Other intestinal obstruction unspecified as to partial versus complete obstruction: Principal | ICD-10-CM | POA: Diagnosis present

## 2016-10-27 DIAGNOSIS — K56609 Unspecified intestinal obstruction, unspecified as to partial versus complete obstruction: Secondary | ICD-10-CM | POA: Diagnosis not present

## 2016-10-27 DIAGNOSIS — K562 Volvulus: Secondary | ICD-10-CM | POA: Diagnosis present

## 2016-10-27 DIAGNOSIS — R293 Abnormal posture: Secondary | ICD-10-CM | POA: Diagnosis not present

## 2016-10-27 DIAGNOSIS — J189 Pneumonia, unspecified organism: Secondary | ICD-10-CM | POA: Diagnosis not present

## 2016-10-27 DIAGNOSIS — J449 Chronic obstructive pulmonary disease, unspecified: Secondary | ICD-10-CM | POA: Diagnosis not present

## 2016-10-27 DIAGNOSIS — L8990 Pressure ulcer of unspecified site, unspecified stage: Secondary | ICD-10-CM | POA: Insufficient documentation

## 2016-10-27 DIAGNOSIS — G8191 Hemiplegia, unspecified affecting right dominant side: Secondary | ICD-10-CM

## 2016-10-27 DIAGNOSIS — K5939 Other megacolon: Secondary | ICD-10-CM | POA: Diagnosis not present

## 2016-10-27 LAB — CBC WITH DIFFERENTIAL/PLATELET
BASOS ABS: 0 10*3/uL (ref 0.0–0.1)
BASOS PCT: 0 %
EOS ABS: 0.4 10*3/uL (ref 0.0–0.7)
Eosinophils Relative: 3 %
HCT: 41.7 % (ref 39.0–52.0)
Hemoglobin: 13.5 g/dL (ref 13.0–17.0)
Lymphocytes Relative: 16 %
Lymphs Abs: 2.3 10*3/uL (ref 0.7–4.0)
MCH: 29.5 pg (ref 26.0–34.0)
MCHC: 32.4 g/dL (ref 30.0–36.0)
MCV: 91.2 fL (ref 78.0–100.0)
MONOS PCT: 8 %
Monocytes Absolute: 1.1 10*3/uL — ABNORMAL HIGH (ref 0.1–1.0)
NEUTROS ABS: 10.3 10*3/uL — AB (ref 1.7–7.7)
NEUTROS PCT: 73 %
Platelets: 282 10*3/uL (ref 150–400)
RBC: 4.57 MIL/uL (ref 4.22–5.81)
RDW: 13.4 % (ref 11.5–15.5)
WBC: 14.2 10*3/uL — AB (ref 4.0–10.5)

## 2016-10-27 LAB — PROTIME-INR
INR: 2.13
PROTHROMBIN TIME: 24.2 s — AB (ref 11.4–15.2)

## 2016-10-27 LAB — TSH: TSH: <0.01 u[IU]/mL — ABNORMAL LOW (ref 0.350–4.500)

## 2016-10-27 LAB — BASIC METABOLIC PANEL
Anion gap: 8 (ref 5–15)
BUN: 29 mg/dL — ABNORMAL HIGH (ref 6–20)
CALCIUM: 8.6 mg/dL — AB (ref 8.9–10.3)
CO2: 27 mmol/L (ref 22–32)
CREATININE: 1.33 mg/dL — AB (ref 0.61–1.24)
Chloride: 105 mmol/L (ref 101–111)
GFR calc Af Amer: >60 mL/min (ref >60)
GFR, EST NON AFRICAN AMERICAN: 53 mL/min — AB (ref >60)
Glucose, Bld: 110 mg/dL — ABNORMAL HIGH (ref 65–99)
Potassium: 2.9 mmol/L — ABNORMAL LOW (ref 3.5–5.1)
Sodium: 140 mmol/L (ref 135–145)

## 2016-10-27 MED ORDER — SYSTANE LID WIPES EX PADS: MEDICATED_PAD | Freq: Two times a day (BID) | CUTANEOUS | Status: DC

## 2016-10-27 MED ORDER — ARTIFICIAL TEARS OPHTHALMIC OINT
1.0000 "application " | TOPICAL_OINTMENT | Freq: Every day | OPHTHALMIC | Status: DC
  Administered 2016-10-27 – 2016-10-30 (×4): 1 via OPHTHALMIC
  Filled 2016-10-27: qty 3.5

## 2016-10-27 MED ORDER — KCL IN DEXTROSE-NACL 40-5-0.9 MEQ/L-%-% IV SOLN
INTRAVENOUS | Status: DC
  Administered 2016-10-27 – 2016-10-30 (×5): via INTRAVENOUS

## 2016-10-27 MED ORDER — IPRATROPIUM BROMIDE 0.02 % IN SOLN
0.5000 mg | Freq: Four times a day (QID) | RESPIRATORY_TRACT | Status: DC
  Administered 2016-10-27 – 2016-10-31 (×14): 0.5 mg via RESPIRATORY_TRACT
  Filled 2016-10-27 (×15): qty 2.5

## 2016-10-27 MED ORDER — IOPAMIDOL (ISOVUE-300) INJECTION 61%: 50.0000 mL | Freq: Once | INTRAVENOUS | Status: DC | PRN

## 2016-10-27 MED ORDER — LISINOPRIL 10 MG PO TABS
20.0000 mg | ORAL_TABLET | Freq: Every day | ORAL | Status: DC
  Administered 2016-10-27 – 2016-10-31 (×4): 20 mg via ORAL
  Filled 2016-10-27 (×4): qty 2

## 2016-10-27 MED ORDER — SULFAMETHOXAZOLE-TRIMETHOPRIM 200-40 MG/5ML PO SUSP
20.0000 mL | Freq: Two times a day (BID) | ORAL | Status: DC
  Filled 2016-10-27 (×6): qty 20

## 2016-10-27 MED ORDER — LIDOCAINE HCL 2 % EX GEL
CUTANEOUS | Status: AC
  Filled 2016-10-27: qty 10

## 2016-10-27 MED ORDER — PREDNISONE 20 MG PO TABS
40.0000 mg | ORAL_TABLET | Freq: Every day | ORAL | Status: DC
  Administered 2016-10-29 – 2016-10-31 (×3): 40 mg via ORAL
  Filled 2016-10-27 (×3): qty 2

## 2016-10-27 MED ORDER — ORAL CARE MOUTH RINSE
15.0000 mL | Freq: Two times a day (BID) | OROMUCOSAL | Status: DC
  Administered 2016-10-27 – 2016-10-31 (×8): 15 mL via OROMUCOSAL

## 2016-10-27 MED ORDER — POTASSIUM CHLORIDE 10 MEQ/100ML IV SOLN
10.0000 meq | INTRAVENOUS | Status: AC
  Administered 2016-10-27 (×3): 10 meq via INTRAVENOUS
  Filled 2016-10-27: qty 100

## 2016-10-27 MED ORDER — ACETAMINOPHEN 325 MG PO TABS: 650.0000 mg | ORAL_TABLET | Freq: Four times a day (QID) | ORAL | Status: DC | PRN

## 2016-10-27 MED ORDER — SODIUM CHLORIDE 0.9 % IV SOLN
INTRAVENOUS | Status: DC
  Administered 2016-10-27: 06:00:00 via INTRAVENOUS

## 2016-10-27 MED ORDER — POLYVINYL ALCOHOL 1.4 % OP SOLN
1.0000 [drp] | Freq: Two times a day (BID) | OPHTHALMIC | Status: DC
  Administered 2016-10-27 – 2016-10-31 (×7): 1 [drp] via OPHTHALMIC
  Filled 2016-10-27 (×2): qty 15

## 2016-10-27 MED ORDER — FENTANYL CITRATE (PF) 100 MCG/2ML IJ SOLN
50.0000 ug | Freq: Once | INTRAMUSCULAR | Status: AC
  Administered 2016-10-27: 50 ug via INTRAVENOUS
  Filled 2016-10-27: qty 2

## 2016-10-27 MED ORDER — LABETALOL HCL 200 MG PO TABS
200.0000 mg | ORAL_TABLET | Freq: Every day | ORAL | Status: DC
  Administered 2016-10-27 – 2016-10-31 (×4): 200 mg via ORAL
  Filled 2016-10-27 (×4): qty 1

## 2016-10-27 NOTE — Progress Notes (Signed)
Patient denies abdominal pain. He is passing flatus and had a large bowel movement. Abdomen is distended but much softer. Advance diet to full liquids. Portable KUB in a.m.

## 2016-10-27 NOTE — Progress Notes (Signed)
Patient ID: [name] [name], [name] [name]   DOB: [birth year], 71 y.o.   MRN: [id number] [facility] with abdominal distention. Patient states he had a BM yesterday. Has not passed flatus. Underwent a Flex sigmoid 10/25/2016 by Dr. Karilyn Cotaehman for decompression of volvulus. Underwent flex sigmoid 10/21/2016 for same.  Coumadin on hold per records Hx of CVA Blood pressure 138/89, pulse 99, temperature 98.2 F (36.8 C), temperature source Oral, resp. rate 18, height 5\' 10"  (1.778 m), weight 205 lb (93 kg), SpO2 97 %. Alert. Skin dry. Abdomen distended but does have BS.  Assessment: Hx of volvulus.Colonic stricture.  Plan: Patient needs surgical consult. Rectal tube. I discussed with Dr. Karilyn Cotaehman.

## 2016-10-27 NOTE — ED Notes (Signed)
Report given to Megan RN

## 2016-10-27 NOTE — ED Notes (Signed)
Patient transported to CT 

## 2016-10-27 NOTE — Progress Notes (Signed)
Patient seen in emergency room. 18 French red rubber catheter advanced without any difficulty. Significant amount of air escaped suggesting that catheter-tipped this proximal to sigmoid volvulus. Will leave catheter in for now. Clear liquids.

## 2016-10-27 NOTE — ED Provider Notes (Signed)
[facility]Y DEPT Provider Note   CSN: [id number] Arrival date & time: 10/27/16  0340  Time seen 04:10 AM   History   Chief Complaint Chief Complaint  Patient presents with  . 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 male.  HPI  patient was sent to the emergency department by his nursing facility for complaints of abdominal pain. When I go in the room and asked the patient how he is feeling he states "I need to use the bathroom". He denies any nausea or vomiting. Review of his record shows he has a history of volvulus. He was admitted on May 3 and Dr. Karilyn Cota did a colonoscope  decompression on 5/3. He was discharged on May 5. He had recurrence of the volvulus on May 7 and was decompressed again by flexible sigmoidoscopy. 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  PCP Mahlon Gammon, MD   Past Medical History:  Diagnosis Date  . Dysphasia   . Fatty liver   . Hemiplegia (HCC)   . Hypertension   . Pneumonia   . Pulmonary embolism (HCC)   . Stroke [facility])     Patient Active Problem List   Diagnosis Date Noted  . 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 Volvulus (HCC) 10/21/2016  . COPD (chronic obstructive pulmonary disease) (HCC) 10/19/2016  . Skin lesion 10/07/2015  . History of bronchitis 10/05/2015  . Acute bronchitis 03/21/2015  . Edema 03/07/2015  . Stroke/cerebrovascular accident (HCC) 03/20/2014  . Cellulitis 04/10/2013  . Long term current use of anticoagulant therapy 10/21/2012  . Cerebral infarction (HCC) 09/26/2012  . HTN (hypertension) 09/26/2012  . DVT (deep venous thrombosis) (HCC) 09/26/2012  . Chronic pulmonary embolism (HCC) 09/26/2012  . Pneumonia 09/26/2012  . Constipation 09/26/2012  . IMPINGEMENT SYNDROME 02/14/2008    Past Surgical History:  Procedure Laterality Date  . FLEXIBLE SIGMOIDOSCOPY N/A 10/21/2016   Procedure: FLEXIBLE SIGMOIDOSCOPY;  Surgeon:  Malissa Hippo, MD;  Location: [facility] ENDO SUITE;  Service: Endoscopy;  Laterality: N/A;  . unable         Home Medications    Prior to Admission medications   Medication Sig Start Date End Date Taking? Authorizing Provider  acetaminophen (TYLENOL) 325 MG tablet Take 650 mg by mouth every 6 (six) hours as needed for moderate pain.     [provider]  betamethasone dipropionate (DIPROLENE) 0.05 % cream Apply 1 application topically 2 (two) times daily. Apply to legs and hands NOT TO FACE, GROIN,AXILLA TRY CERAVAE FIRST twice  day     [provider]  Dextromethorphan-Guaifenesin (ROBITUSSIN DM PO) Take 15 mLs by mouth every 6 (six) hours as needed (cough).     [provider]  Emollient (CERAVE) CREA Apply 1 application topically daily as needed for itching once a day    [provider]  Eyelid Cleansers (SYSTANE LID WIPES EX) Apply 1 application topically 2 (two) times daily. Start occusoft lid scrubs: clean eyelids of oil and debris BID OU ( each eye ) before administering eye drops.     [provider]  ipratropium (ATROVENT) 0.02 % nebulizer solution Take 0.5 mg by nebulization every 6 (six) hours.    [provider]  labetalol (NORMODYNE) 200 MG tablet Take 200 mg by mouth daily. For HTN    [provider]  lisinopril (PRINIVIL,ZESTRIL) 20 MG tablet Take 20 mg by mouth daily.  [provider]  loratadine (CLARITIN) 10 MG tablet Take 10 mg by mouth daily.    [provider]  polyethylene glycol (MIRALAX / GLYCOLAX) packet Take 17 g by mouth daily. 10/24/16   Filbert Schilder, MD  polyvinyl alcohol (LIQUIFILM TEARS) 1.4 % ophthalmic solution Place 1 drop into both eyes 2 (two) times daily.    [provider]  predniSONE (DELTASONE) 20 MG tablet Take 40 mg by mouth daily with breakfast.    [provider]  sennosides-docusate sodium (SENOKOT-S) 8.6-50 MG tablet Take 1 tablet by mouth at  bedtime. For constipation    [provider]  sulfamethoxazole-trimethoprim (BACTRIM,SEPTRA) 200-40 MG/5ML suspension Take 20 mLs by mouth every 12 (twelve) hours. 10/23/16 10/28/16  Filbert Schilder, MD  warfarin (COUMADIN) 2 MG tablet Take 1 tablet (2 mg total) by mouth daily at 6 PM. Give 2 mg by mouth once a day 10/25/16   Filbert Schilder, MD  White Petrolatum-Mineral Oil (SYSTANE NIGHTTIME) OINT Place 1 application into the left eye at bedtime. Apply to left eye at night     [provider]    Family History History reviewed. No pertinent family history.  Social History Social History  Substance Use Topics  . Smoking status: Never Smoker  . Smokeless tobacco: Never Used  . Alcohol use No  lives in a NH Uses a wheelchair   Allergies   Penicillins   Review of Systems Review of Systems  Unable to perform ROS: Other     Physical Exam Updated Vital Signs BP 116/81 (BP Location: Left Arm)   Pulse 98   Temp 98.2 F (36.8 C) (Oral)   Resp 19   Ht 5\' 10"  (1.778 m)   Wt 205 lb (93 kg)   SpO2 95%   BMI 29.41 kg/m   Vital signs normal    Physical Exam  Constitutional: He appears well-developed and well-nourished. He appears distressed.  HENT:  Head: Normocephalic and atraumatic.  Left Ear: External ear normal.  Mouth/Throat: Oropharynx is clear and moist.  Eyes: Conjunctivae and EOM are normal. Pupils are equal, round, and reactive to light.  Neck: Normal range of motion. Neck supple.  Cardiovascular: Normal rate and regular rhythm.  Exam reveals no gallop and no friction rub.   No murmur heard. Pulmonary/Chest: Effort normal and breath sounds normal. No respiratory distress. He has no wheezes. He has no rales.  Abdominal: He exhibits distension.  Patient has marked distention of his abdomen which is extremely tight to palpation. He has marked diminished bowel sounds diffusely.  Musculoskeletal:  Patient has left hemiparesis  Neurological:  He is alert.  Patient has left facial droop, he has left hemiparesis     ED Treatments / Results  Labs (all labs ordered are listed, but only abnormal results are displayed) Results for orders placed or performed during the hospital encounter of 10/27/16  Basic metabolic panel  Result Value Ref Range   Sodium 140 135 - 145 mmol/L   Potassium 2.9 (L) 3.5 - 5.1 mmol/L   Chloride 105 101 - 111 mmol/L   CO2 27 22 - 32 mmol/L   Glucose, Bld 110 (H) 65 - 99 mg/dL   BUN 29 (H) 6 - 20 mg/dL   Creatinine, Ser 9.14 (H) 0.61 - 1.24 mg/dL   Calcium 8.6 (L) 8.9 - 10.3 mg/dL   GFR calc non Af Amer 53 (L) >60 mL/min   GFR calc Af Amer >60 >60 mL/min   Anion gap  8 5 - 15  CBC with Differential  Result Value Ref Range   WBC 14.2 (H) 4.0 - 10.5 K/uL   RBC 4.57 4.22 - 5.81 MIL/uL   Hemoglobin 13.5 13.0 - 17.0 g/dL   HCT 16.141.7 09.639.0 - 04.552.0 %   MCV 91.2 78.0 - 100.0 fL   MCH 29.5 26.0 - 34.0 pg   MCHC 32.4 30.0 - 36.0 g/dL   RDW 40.913.4 81.111.5 - 91.415.5 %   Platelets 282 150 - 400 K/uL   Neutrophils Relative % 73 %   Neutro Abs 10.3 (H) 1.7 - 7.7 K/uL   Lymphocytes Relative 16 %   Lymphs Abs 2.3 0.7 - 4.0 K/uL   Monocytes Relative 8 %   Monocytes Absolute 1.1 (H) 0.1 - 1.0 K/uL   Eosinophils Relative 3 %   Eosinophils Absolute 0.4 0.0 - 0.7 K/uL   Basophils Relative 0 %   Basophils Absolute 0.0 0.0 - 0.1 K/uL   Laboratory interpretation all normal except leukocytosis, hypokalemia   EKG  EKG Interpretation None       Radiology  Ct Abdomen Pelvis Wo Contrast  Result Date: 10/27/2016 CLINICAL DATA:  Abdominal pain and distention.  Recurrent volvulus. EXAM: CT ABDOMEN AND PELVIS WITHOUT CONTRAST TECHNIQUE: Multidetector CT imaging of the abdomen and pelvis was performed following the standard protocol without IV contrast. COMPARISON:  10/21/2016 FINDINGS: Lower chest: Atelectasis or consolidation in the lung bases, greater on the right. Can't exclude pneumonia. Coronary artery and aortic  calcifications. Hepatobiliary: No focal liver abnormality is seen. No gallstones, gallbladder wall thickening, or biliary dilatation. Pancreas: Unremarkable. No pancreatic ductal dilatation or surrounding inflammatory changes. Spleen: Normal in size without focal abnormality. Adrenals/Urinary Tract: Adrenal glands are unremarkable. Kidneys are normal, without renal calculi, focal lesion, or hydronephrosis. Bladder is unremarkable. Stomach/Bowel: Stomach and small bowel are decompressed. There is prominent gaseous distention of the colon with scattered stool throughout the colon. Contrast material was instilled through a rectal tube. There is only a small amount of contrast material in the colon. There is a fairly tight area of narrowing at the rectosigmoid junction through which the catheter passes. The rectum itself is decompressed. Transition zone does not appear to be acutely twisted at this time and may have been reduced by the rectal catheter. There is suggestion of a stricture at this location with prominent proximal distention similar to previous study. No discrete mass lesion is appreciated. Vascular/Lymphatic: Aortic atherosclerosis. No enlarged abdominal or pelvic lymph nodes. Reproductive: Prostate is unremarkable. Other: No free air or free fluid in the abdomen. Moderate-sized periumbilical hernia containing fat. No bowel herniation. Musculoskeletal: Degenerative changes in the spine. No destructive bone lesions. IMPRESSION: Prominent gaseous distention of the colon to the rectosigmoid junction where there appears to be a focal stricture. No acute twisting at the level of the stricture at this time. A rectal catheter was placed for contrast administration in the catheter passes through the stricture into the dilated colon. The rectum is decompressed. Electronically Signed   By: Burman NievesWilliam  Stevens M.D.   On: 10/27/2016 06:43     Dg Abdomen Acute W/chest  Result Date: 10/27/2016 CLINICAL DATA:   Abdominal pain and distention.  History of volvulus. EXAM: DG ABDOMEN ACUTE W/ 1V CHEST COMPARISON:  10/25/2016 FINDINGS: Shallow inspiration with linear atelectasis in the lung bases. Heart size and pulmonary vascularity are normal for technique. No pneumothorax. Calcified aorta. Diffuse gaseous distention of the colon is similar to previous study. 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is not excluded. No  free intra-abdominal air is demonstrated. No radiopaque stones. Calcified phleboliths in the pelvis. Degenerative changes in the spine. IMPRESSION: Shallow inspiration with atelectasis in the lung bases. Persistent diffuse gaseous distention of the colon similar to previous studies. Electronically Signed   By: Burman Nieves M.D.   On: 10/27/2016 04:41     Dg Abd 1 View  Result Date: 10/25/2016 CLINICAL DATA:  Status post sigmoid volvulus decompression.IMPRESSION: Persistent but slightly decreased diffuse gaseous colonic dilatation. Electronically Signed   By: Kennith Center M.D.   On: 10/25/2016 18:34     Dg Chest Port 1 View  Result Date: 10/21/2016 CLINICAL DATA:  Shortness of breath and fevers EXAM: PORTABLE CHEST 1 VIEW COMPARISON:  03/20/2015 FINDINGS: Cardiac shadow is mildly enlarged but stable. Overall poor inspiratory effort is noted. Some basilar atelectasis is seen likely related to the poor inspiratory effort. No sizable effusion is seen. No bony abnormality is noted. IMPRESSION: Poor inspiratory effort with basilar atelectasis. Electronically Signed   By: Alcide Clever M.D.   On: 10/21/2016 09:47    Ct Renal Stone Study  Result Date: 10/21/2016 CLINICAL DATA:  Abdominal distention and shortness of Breath  IMPRESSION: Changes consistent with sigmoid volume ileus and proximal colonic obstructive change. The distal most: Beyond the volvulus is decompressed. Surgical consultation is recommended. Mild fullness of the left renal collecting system without definitive stone. These results were called by telephone at  the time of interpretation on 10/21/2016 at 11:32 am to Dr. Marily Memos , who verbally acknowledged these results. Electronically Signed   By: Alcide Clever M.D.   On: 10/21/2016 11:33       Procedures Procedures (including critical care time)  Medications Ordered in ED Medications  0.9 %  sodium chloride infusion ( Intravenous New Bag/Given 10/27/16 0608)  iopamidol (ISOVUE-300) 61 % injection 50 mL (not administered)  potassium chloride 10 mEq in 100 mL IVPB (not administered)  fentaNYL (SUBLIMAZE) injection 50 mcg (50 mcg Intravenous Given 10/27/16 0608)     Initial Impression / Assessment and Plan / ED Course  I have reviewed the triage vital signs and the nursing notes.  Pertinent labs & imaging results that were available during my care of the patient were reviewed by me and considered in my medical decision making (see chart for details).  05:33 AM Dr Darrick Penna, GI, we discussed his xrays including the last 2 and his CT. There is no measurement of the degree of dilatation of his bowel. She is going to call radiologist then call me back.   05:40 AM Dr Darrick Penna, states she has discussed with the radiologist and they want to get an Abd/Pelvis CT scan with rectal contrast  05:47 AM Dr Darrick Penna, called back and asks to have the hospitalist admit.  06:32 AM Dr Darrick Penna, called back and states the preliminary reading on the CT scan is that the patient has a stricture. Dr Karilyn Cota will see this morning, most likely will need surgery consult.   Patient was started on IV potassium runs for his hypokalemia since he is currently nothing by mouth.  07:06 AM Dr Conley Rolls, hospitalist will admit  Final Clinical Impressions(s) / ED Diagnoses   Final diagnoses:  Rectal stricture  Hypokalemia    Plan admission  Devoria Albe, MD, Concha Pyo, MD 10/27/16 [phone]

## 2016-10-27 NOTE — H&P (Addendum)
History and Physical    [name] [name] ZOX:[id number] DOB: June 14, 1946 DOA: 10/27/2016  PCP: Mahlon Gammon, MD  Patient coming from: SNF.   Chief Complaint:  Abdominal distention.  HPI: [name] [name] is an 71 y.o. 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  He was seen by Dr Lovell Sheehan and Dr Renae Fickle, and had Flex sig with successful decompression of his volvulus.  At that time, he may have had requried surgery, but would be a poor surgical candidate given multiple co morbities.  Evaluation in the ER today showed persistent leukocytosis with WBC of 14K, Cr of 1.3 and K of 2.9.  He was given IV K supplement. A CT showed suspicious for rectosigmoid stricture, though the catheter was able to be passed thru, and no evidence of volvulus.  EDP spoke with Dr Renae Fickle, who will see patient today, and hospitalist was asked to admit him for same.    ED Course:  See above.  Rewiew of Systems: Unable.   Past Medical History:  Diagnosis Date  . Dysphasia   . Fatty liver   . Hemiplegia (HCC)   . Hypertension   . Pneumonia   . Pulmonary embolism (HCC)   . Stroke [facility])      Past Surgical History:  Procedure Laterality Date  . FLEXIBLE SIGMOIDOSCOPY N/A 10/21/2016   Procedure: FLEXIBLE SIGMOIDOSCOPY;  Surgeon: Malissa Hippo, MD;  Location: [facility];  Service: Endoscopy;  Laterality: N/A;  . unable       reports that he has never smoked. He has never used smokeless tobacco. He reports that he does not drink alcohol or use drugs.  Allergies  Allergen Reactions  . Penicillins     Has patient had a PCN reaction causing immediate rash, facial/tongue/throat swelling, SOB or lightheadedness with hypotension: unknown Has patient had a PCN reaction causing severe rash involving mucus membranes or skin necrosis: unknown Has patient had a PCN reaction that required hospitalization:  unknown Has patient had a PCN reaction occurring within the last 10 years: unknown If all of the above answers are "NO", then may proceed with Cephalosporin use. 5/3 Tolerated Cefepime x 1 dose in ED.     History reviewed. No pertinent family history.   Prior to Admission medications   Medication Sig Start Date End Date Taking? Authorizing Provider  acetaminophen (TYLENOL) 325 MG tablet Take 650 mg by mouth every 6 (six) hours as needed for moderate pain.     [provider]  betamethasone dipropionate (DIPROLENE) 0.05 % cream Apply 1 application topically 2 (two) times daily. Apply to legs and hands NOT TO FACE, GROIN,AXILLA TRY CERAVAE FIRST twice  day     [provider]  Dextromethorphan-Guaifenesin (ROBITUSSIN DM PO) Take 15 mLs by mouth every 6 (six) hours as needed (cough).     [provider]  Emollient (CERAVE) CREA Apply 1 application topically daily as needed for itching once a day    [provider]  Eyelid Cleansers (SYSTANE LID WIPES EX) Apply 1 application topically 2 (two) times daily. Start occusoft lid scrubs: clean eyelids of oil and debris BID OU ( each eye ) before administering eye drops.     [provider]  ipratropium (ATROVENT) 0.02 % nebulizer solution Take 0.5 mg by nebulization every 6 (six) hours.    [provider]  labetalol (NORMODYNE) 200 MG tablet Take  200 mg by mouth daily. For HTN    [provider]  lisinopril (PRINIVIL,ZESTRIL) 20 MG tablet Take 20 mg by mouth daily.    [provider]  loratadine (CLARITIN) 10 MG tablet Take 10 mg by mouth daily.    [provider]  polyethylene glycol (MIRALAX / GLYCOLAX) packet Take 17 g by mouth daily. 10/24/16   Filbert Schilder, MD  polyvinyl alcohol (LIQUIFILM TEARS) 1.4 % ophthalmic solution Place 1 drop into both eyes 2 (two) times daily.    [provider]  predniSONE (DELTASONE) 20 MG tablet Take 40 mg by mouth daily  with breakfast.    [provider]  sennosides-docusate sodium (SENOKOT-S) 8.6-50 MG tablet Take 1 tablet by mouth at bedtime. For constipation    [provider]  sulfamethoxazole-trimethoprim (BACTRIM,SEPTRA) 200-40 MG/5ML suspension Take 20 mLs by mouth every 12 (twelve) hours. 10/23/16 10/28/16  Filbert Schilder, MD  warfarin (COUMADIN) 2 MG tablet Take 1 tablet (2 mg total) by mouth daily at 6 PM. Give 2 mg by mouth once a day 10/25/16   Filbert Schilder, MD  White Petrolatum-Mineral Oil (SYSTANE NIGHTTIME) OINT Place 1 application into the left eye at bedtime. Apply to left eye at night     [provider]    Physical Exam: Vitals:   10/27/16 0400 10/27/16 0507 10/27/16 0530 10/27/16 0600  BP: 128/84 120/83 129/89 117/88  Pulse: 99 94 92 90  Resp: 18 20 17 18   Temp:      TempSrc:      SpO2: 95% 93% 97% 97%  Weight:      Height:          Constitutional: NAD, calm, comfortable Vitals:   10/27/16 0400 10/27/16 0507 10/27/16 0530 10/27/16 0600  BP: 128/84 120/83 129/89 117/88  Pulse: 99 94 92 90  Resp: 18 20 17 18   Temp:      TempSrc:      SpO2: 95% 93% 97% 97%  Weight:      Height:       Eyes: PERRL, lids and conjunctivae normal ENMT: Mucous membranes are moist. Posterior pharynx clear of any exudate or lesions.Normal dentition.  Neck: normal, supple, no masses, no thyromegaly Respiratory: clear to auscultation bilaterally, no wheezing, no crackles. Normal respiratory effort. No accessory muscle use.  Cardiovascular: Regular rate and rhythm, no murmurs / rubs / gallops. No extremity edema. 2+ pedal pulses. No carotid bruits.  Abdomen: distended.  Tender diffusely.  No hepatosplenomegaly. Bowel sounds positive.  Musculoskeletal: no clubbing / cyanosis. No joint deformity upper and lower extremities. Good ROM, no contractures. Normal muscle tone.  Skin: no rashes, lesions, ulcers. No induration Neurologic: CN 2-12 grossly intact. Sensation  intact, DTR normal. Strength 5/5 in all 4.  Psychiatric: Normal judgment and insight. Alert and oriented x 3. Normal mood.    Labs on Admission: I have personally reviewed following labs and imaging studies  CBC:  Recent Labs Lab 10/21/16 0700 10/21/16 0914 10/22/16 0612 10/27/16 0527  WBC 14.5* 16.4* 10.6* 14.2*  NEUTROABS 11.4* 13.0*  --  10.3*  HGB 15.2 15.4 13.1 13.5  HCT 46.5 47.0 40.7 41.7  MCV 92.3 92.5 92.9 91.2  PLT 334 347 254 282   Basic Metabolic Panel:  Recent Labs Lab 10/21/16 0914 10/21/16 1401 10/22/16 0612 10/27/16 0527  NA 141  --  144 140  K 3.7  --  4.1 2.9*  CL 106  --  112* 105  CO2 26  --  26 27  GLUCOSE 163*  --  97 110*  BUN 51*  --  35* 29*  CREATININE 1.72* 0.60* 1.09 1.33*  CALCIUM 9.1  --  8.4* 8.6*   GFR: Estimated Creatinine Clearance: 59.2 mL/min (A) (by C-G formula based on SCr of 1.33 mg/dL (H)). Liver Function Tests:  Recent Labs Lab 10/21/16 0914  AST 38  ALT 45  ALKPHOS 59  BILITOT 0.7  PROT 8.2*  ALBUMIN 3.5   Coagulation Profile:  Recent Labs Lab 10/21/16 0700 10/22/16 0612 10/23/16 0632 10/25/16 0700  INR 2.90 4.28* 3.16 1.75   Cardiac Enzymes:  Recent Labs Lab 10/21/16 0914 10/21/16 1940 10/22/16 0015 10/22/16 0612  TROPONINI 0.06* 0.05* 0.05* 0.03*   Urine analysis:    Component Value Date/Time   COLORURINE YELLOW 10/20/2016 0230   APPEARANCEUR CLEAR 10/20/2016 0230   LABSPEC 1.025 10/20/2016 0230   PHURINE 5.5 10/20/2016 0230   GLUCOSEU NEGATIVE 10/20/2016 0230   HGBUR SMALL (A) 10/20/2016 0230   BILIRUBINUR NEGATIVE 10/20/2016 0230   KETONESUR TRACE (A) 10/20/2016 0230   PROTEINUR 100 (A) 10/20/2016 0230   NITRITE POSITIVE (A) 10/20/2016 0230   LEUKOCYTESUR NEGATIVE 10/20/2016 0230   Recent Results (from the past 240 hour(s))  Culture, Urine     Status: Abnormal   Collection Time: 10/20/16  2:30 AM  Result Value Ref Range Status   Specimen Description URINE, CLEAN CATCH  Final    Special Requests NONE  Final   Culture 60,000 COLONIES/mL CITROBACTER KOSERI (A)  Final   Report Status 10/22/2016 FINAL  Final   Organism ID, Bacteria CITROBACTER KOSERI (A)  Final      Susceptibility   Citrobacter koseri - MIC*    CEFAZOLIN >=64 RESISTANT Resistant     CEFTRIAXONE 32 INTERMEDIATE Intermediate     CIPROFLOXACIN >=4 RESISTANT Resistant     GENTAMICIN <=1 SENSITIVE Sensitive     IMIPENEM 1 SENSITIVE Sensitive     NITROFURANTOIN 64 INTERMEDIATE Intermediate     TRIMETH/SULFA <=20 SENSITIVE Sensitive     PIP/TAZO 32 INTERMEDIATE Intermediate     * 60,000 COLONIES/mL CITROBACTER KOSERI  Blood Culture (routine x 2)     Status: None   Collection Time: 10/21/16  9:14 AM  Result Value Ref Range Status   Specimen Description BLOOD RIGHT HAND  Final   Special Requests   Final    BOTTLES DRAWN AEROBIC AND ANAEROBIC Blood Culture adequate volume   Culture NO GROWTH 5 DAYS  Final   Report Status 10/26/2016 FINAL  Final  Blood Culture (routine x 2)     Status: None   Collection Time: 10/21/16  9:27 AM  Result Value Ref Range Status   Specimen Description BLOOD RIGHT FOREARM  Final   Special Requests   Final    BOTTLES DRAWN AEROBIC AND ANAEROBIC Blood Culture adequate volume   Culture NO GROWTH 5 DAYS  Final   Report Status 10/26/2016 FINAL  Final  MRSA PCR Screening     Status: None   Collection Time: 10/21/16  4:43 PM  Result Value Ref Range Status   MRSA by PCR NEGATIVE NEGATIVE Final    Comment:        The GeneXpert MRSA Assay (FDA approved for NASAL specimens only), is one component of a comprehensive MRSA colonization surveillance program. It is not intended to diagnose MRSA infection nor to guide or monitor treatment for MRSA infections.      Radiological Exams on Admission: Ct Abdomen Pelvis Wo Contrast  Result Date: 10/27/2016 CLINICAL DATA:  Abdominal pain and distention.  Recurrent volvulus. EXAM: CT ABDOMEN AND PELVIS WITHOUT CONTRAST TECHNIQUE:  Multidetector CT imaging of the abdomen and pelvis was performed following the standard protocol without IV contrast. COMPARISON:  10/21/2016 FINDINGS: Lower chest: Atelectasis or consolidation in the lung bases, greater on the right. Can't exclude pneumonia. Coronary artery and aortic calcifications. Hepatobiliary: No focal liver abnormality is seen. No gallstones, gallbladder wall thickening, or biliary dilatation. Pancreas: Unremarkable. No pancreatic ductal dilatation or surrounding inflammatory changes. Spleen: Normal in size without focal abnormality. Adrenals/Urinary Tract: Adrenal glands are unremarkable. Kidneys are normal, without renal calculi, focal lesion, or hydronephrosis. Bladder is unremarkable. Stomach/Bowel: Stomach and small bowel are decompressed. There is prominent gaseous distention of the colon with scattered stool throughout the colon. Contrast material was instilled through a rectal tube. There is only a small amount of contrast material in the colon. There is a fairly tight area of narrowing at the rectosigmoid junction through which the catheter passes. The rectum itself is decompressed. Transition zone does not appear to be acutely twisted at this time and may have been reduced by the rectal catheter. There is suggestion of a stricture at this location with prominent proximal distention similar to previous study. No discrete mass lesion is appreciated. Vascular/Lymphatic: Aortic atherosclerosis. No enlarged abdominal or pelvic lymph nodes. Reproductive: Prostate is unremarkable. Other: No free air or free fluid in the abdomen. Moderate-sized periumbilical hernia containing fat. No bowel herniation. Musculoskeletal: Degenerative changes in the spine. No destructive bone lesions. IMPRESSION: Prominent gaseous distention of the colon to the rectosigmoid junction where there appears to be a focal stricture. No acute twisting at the level of the stricture at this time. A rectal catheter was  placed for contrast administration in the catheter passes through the stricture into the dilated colon. The rectum is decompressed. Electronically Signed   By: Burman Nieves M.D.   On: 10/27/2016 06:43   Dg Abd 1 View  Result Date: 10/25/2016 CLINICAL DATA:  Status post sigmoid volvulus decompression. EXAM: ABDOMEN - 1 VIEW COMPARISON:  10/23/2016.  Scout image from abdomen CT of 10/31/2016. FINDINGS: Diffuse gaseous colonic distention persists but appears slightly improved in the interval. Degenerative changes noted lumbar spine. Bones are demineralized. Phleboliths are seen over the anatomic pelvis. IMPRESSION: Persistent but slightly decreased diffuse gaseous colonic dilatation. Electronically Signed   By: Kennith Center M.D.   On: 10/25/2016 18:34   Dg Abdomen Acute W/chest  Result Date: 10/27/2016 CLINICAL DATA:  Abdominal pain and distention.  History of volvulus. EXAM: DG ABDOMEN ACUTE W/ 1V CHEST COMPARISON:  10/25/2016 FINDINGS: Shallow inspiration with linear atelectasis in the lung bases. Heart size and pulmonary vascularity are normal for technique. No pneumothorax. Calcified aorta. Diffuse gaseous distention of the colon is similar to previous study. 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is not excluded. No free intra-abdominal air is demonstrated. No radiopaque stones. Calcified phleboliths in the pelvis. Degenerative changes in the spine. IMPRESSION: Shallow inspiration with atelectasis in the lung bases. Persistent diffuse gaseous distention of the colon similar to previous studies. Electronically Signed   By: Burman Nieves M.D.   On: 10/27/2016 04:41    EKG: Independently reviewed.   Assessment/Plan Principal Problem:   Colonic obstruction (HCC) Active Problems:   Stroke (HCC)   Pulmonary embolism (HCC)   Hypertension   Hemiplegia (HCC)   Hypokalemia    PLAN:   Colonic obstruction:  Recent resolved volvulus.  CT today showed suspicion of distal colonic stricture with  significant gasous  distention.  Will make NPO, place NGT, and give IVF.  Dr Renae Fickleheman will see him.  Will hold Coumadin in case he will need surgical intervention.  WIll consult Dr Lovell SheehanJenkins who had seen him last admission.  He is a poor surgical candidate.    Hypokalemia;  Will supplement.    HTN:  Continue with his meds.   Place on telemetry.  Prior CVA:  Verbal paucity.  Slight dysarthria.    Hx of PE:  Will benefit with longterm anticoagulation.  Coumadin being held at this time.    DVT prophylaxis: SCD.  Code Status: FULL CODE.  Family Communication: None at bedside.  Disposition Plan: SNF. Consults called: GI.  Dr Jettie BoozeField and Dr Lovell SheehanJenkins. Admission status: Inpatient.    Witt Plitt MD FACP. [facility]  If 7PM-7AM, please contact night-coverage [email or web address] Password [email or web address]  10/27/2016, 7:55 AM

## 2016-10-27 NOTE — ED Notes (Signed)
Pt back from x-ray.

## 2016-10-27 NOTE — ED Notes (Signed)
Pt back from CT

## 2016-10-27 NOTE — ED Triage Notes (Signed)
Pt sent over by [facility] for abd pain. Nursing staff states pt does not have any bowel sound and history of ileus.

## 2016-10-27 NOTE — ED Notes (Signed)
Patient transported to X-ray 

## 2016-10-27 NOTE — Progress Notes (Signed)
3:06 PM Patient is a readmission from a recent DC. Patient from [facility]. Plans for return at discharge. Will Need FL2 at discharge. Patient planning for surgical consult.  Will follow up with discharge planning and assist with transition.     Clinical Social Work Assessment  Patient Details  Name: [name] [name] MRN: [id number] Date of Birth: 1945/12/20  Date of referral:  10/22/16               Reason for consult:  Discharge Planning (return to facility)                       Permission sought to share information with:  Case Manager, Magazine features editoracility Contact Representative, Family Supports Permission granted to share information::  Yes, Verbal Permission Granted             Name::                   Agency::  [facility]              Relationship::  brother             Contact Information:     Housing/Transportation Living arrangements for the past 2 months:  Skilled Building surveyorursing Facility Source of Information:  Patient, Medical Team, Sports coachCase Manager, Facility Patient Interpreter Needed:  None Criminal Activity/Legal Involvement Pertinent to Current Situation/Hospitalization:  No - Comment as needed Significant Relationships:  Adult Children, Other Family Members Lives with:  Facility Resident Do you feel safe going back to the place where you live?  Yes Need for family participation in patient care:  Yes (Comment)  Care giving concerns:  Patient is a L[facility]. Plans to return at discharge.   Social Worker assessment / plan:  Assessment completed. No concerns with care or needs. [facility]. Updated [facility] of patient progress, no barriers with return.  Employment status:  Retired Health and safety inspectornsurance information:  Medicare PT Recommendations:  Not assessed at this time Information / Referral to community resources:     Patient/Family's Response to care:  Agreeable  Patient/Family's Understanding of and Emotional Response to Diagnosis,  Current Treatment, and Prognosis:  Appears to understand reason for admission and needs for treatment.   Emotional Assessment Appearance:  Appears stated age Attitude/Demeanor/Rapport:    Affect (typically observed):  Accepting, Adaptable Orientation:  Oriented to Self, Oriented to Place Alcohol / Substance use:  Not Applicable Psych involvement (Current and /or in the community):  No (Comment)  Discharge Needs  Concerns to be addressed:  No discharge needs identified Readmission within the last 30 days:  No Current discharge risk:  None Barriers to Discharge:  No Barriers Identified   Raye SorrowCoble, Lowen Mansouri N, LCSW 10/22/2016, 9:53 AM

## 2016-10-28 ENCOUNTER — Inpatient Hospital Stay (HOSPITAL_COMMUNITY): Payer: Medicare Other

## 2016-10-28 ENCOUNTER — Encounter (HOSPITAL_COMMUNITY)
Admission: RE | Admit: 2016-10-28 | Discharge: 2016-10-28 | Disposition: A | Discharge status: Home / Self Care | Payer: Medicare Other | Source: Skilled Nursing Facility | Attending: Internal Medicine | Admitting: Internal Medicine

## 2016-10-28 DIAGNOSIS — I69328 Other speech and language deficits following cerebral infarction: Secondary | ICD-10-CM | POA: Insufficient documentation

## 2016-10-28 DIAGNOSIS — J189 Pneumonia, unspecified organism: Secondary | ICD-10-CM | POA: Insufficient documentation

## 2016-10-28 DIAGNOSIS — G8191 Hemiplegia, unspecified affecting right dominant side: Secondary | ICD-10-CM | POA: Insufficient documentation

## 2016-10-28 DIAGNOSIS — K624 Stenosis of anus and rectum: Secondary | ICD-10-CM

## 2016-10-28 DIAGNOSIS — Z993 Dependence on wheelchair: Secondary | ICD-10-CM | POA: Insufficient documentation

## 2016-10-28 DIAGNOSIS — Z7901 Long term (current) use of anticoagulants: Secondary | ICD-10-CM | POA: Insufficient documentation

## 2016-10-28 DIAGNOSIS — L8990 Pressure ulcer of unspecified site, unspecified stage: Secondary | ICD-10-CM | POA: Insufficient documentation

## 2016-10-28 DIAGNOSIS — I10 Essential (primary) hypertension: Secondary | ICD-10-CM | POA: Insufficient documentation

## 2016-10-28 LAB — BASIC METABOLIC PANEL
Anion gap: 3 — ABNORMAL LOW (ref 5–15)
BUN: 16 mg/dL (ref 6–20)
CALCIUM: 7.9 mg/dL — AB (ref 8.9–10.3)
CO2: 31 mmol/L (ref 22–32)
CREATININE: 0.97 mg/dL (ref 0.61–1.24)
Chloride: 110 mmol/L (ref 101–111)
GFR calc non Af Amer: >60 mL/min (ref >60)
GLUCOSE: 109 mg/dL — AB (ref 65–99)
Potassium: 3.1 mmol/L — ABNORMAL LOW (ref 3.5–5.1)
Sodium: 144 mmol/L (ref 135–145)

## 2016-10-28 LAB — CBC
HCT: 37.6 % — ABNORMAL LOW (ref 39.0–52.0)
Hemoglobin: 11.8 g/dL — ABNORMAL LOW (ref 13.0–17.0)
MCH: 29.4 pg (ref 26.0–34.0)
MCHC: 31.4 g/dL (ref 30.0–36.0)
MCV: 93.8 fL (ref 78.0–100.0)
PLATELETS: 247 10*3/uL (ref 150–400)
RBC: 4.01 MIL/uL — AB (ref 4.22–5.81)
RDW: 13.4 % (ref 11.5–15.5)
WBC: 13.2 10*3/uL — ABNORMAL HIGH (ref 4.0–10.5)

## 2016-10-28 LAB — PROTIME-INR
INR: 2.34
PROTHROMBIN TIME: 26.1 s — AB (ref 11.4–15.2)

## 2016-10-28 MED ORDER — WARFARIN SODIUM 1 MG PO TABS
1.0000 mg | ORAL_TABLET | Freq: Once | ORAL | Status: AC
  Administered 2016-10-28: 1 mg via ORAL
  Filled 2016-10-28: qty 1

## 2016-10-28 MED ORDER — WARFARIN - PHARMACIST DOSING INPATIENT
Freq: Every day | Status: DC
  Administered 2016-10-28: 18:00:00

## 2016-10-28 NOTE — Progress Notes (Signed)
PROGRESS NOTE    [name] [name]  AVW:[id number] DOB: 10/16/1945 DOA: 10/27/2016 PCP: Mahlon Gammon, MD    Brief Narrative: [name] [name] is an 71 y.o. 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  He is unlikely to require surgery, but he did have aspiration yesterday and was made NPO pending speech re evaluation.    Assessment & Plan:   Principal Problem:   Colonic obstruction (HCC) Active Problems:   Stroke (HCC)   Pulmonary embolism (HCC)   Hypertension   Hemiplegia (HCC)   Hypokalemia   Pressure injury of skin   Colonic obstruction:  Recent resolved volvulus.  CT today showed suspicion of distal colonic stricture with significant gasous distention, but he has improved, and did not require NGT.  He was advanced on his diet, unfortunately, he may have some aspiration.   He was made NPO again, and we will request speech's re-eval and wil follow recommendation.   He is a poor surgical candidate, and unlikely to require surgery, so will restart his Coumadin per pharmacy's dosing.   Hypokalemia;  Will supplement.    HTN:  Continue with his meds.   Place on telemetry.  Prior CVA:  Verbal paucity.  Slight dysarthria.    Hx of PE:  Will benefit with longterm anticoagulation. Coumadin held, will restart it today.  DVT prophylaxis: Coumadin.  Code Status: FULL CODE.  Family Communication: None.  Disposition Plan: SNF.  Consultants:   GI and surgery.   Procedures:   None.   Antimicrobials: Anti-infectives    Start     Dose/Rate Route Frequency Ordered Stop   10/27/16 1130  sulfamethoxazole-trimethoprim (BACTRIM,SEPTRA) 200-40 MG/5ML suspension 20 mL     20 mL Oral Every 12 hours 10/27/16 1116         Subjective:  Wants to eat.  Objective: Vitals:   10/27/16 2227 10/28/16 0307 10/28/16 0651 10/28/16 0904  BP:  106/62  124/73   Pulse: 77  86   Resp: 18  18   Temp: 98.2 F (36.8 C)  98.1 F (36.7 C)   TempSrc: Oral  Oral   SpO2: 100% 100% 100% 99%  Weight:      Height:        Intake/Output Summary (Last 24 hours) at 10/28/16 1205 Last data filed at 10/28/16 0843  Gross per 24 hour  Intake           353.75 ml  Output                0 ml  Net           353.75 ml   Filed Weights   10/27/16 0342 10/27/16 1053  Weight: 93 kg (205 lb) 93 kg (205 lb)    Examination:  General exam: Appears calm and comfortable  Respiratory system: Clear to auscultation. Respiratory effort normal. Cardiovascular system: S1 & S2 heard, RRR. No JVD, murmurs, rubs, gallops or clicks. No pedal edema. Gastrointestinal system: Abdomen is nondistended, soft and nontender. No organomegaly or masses felt. Normal bowel sounds heard. Central nervous system: Alert and oriented. No focal neurological deficits. Extremities: Symmetric 5 x 5 power. Skin: No rashes, lesions or ulcers Psychiatry: Judgement and insight appear normal. Mood & affect appropriate.   Data Reviewed: I have personally reviewed following labs and imaging studies  CBC:  Recent Labs Lab 10/22/16 0612  10/27/16 0527 10/28/16 0505  WBC 10.6* 14.2* 13.2*  NEUTROABS  --  10.3*  --   HGB 13.1 13.5 11.8*  HCT 40.7 41.7 37.6*  MCV 92.9 91.2 93.8  PLT 254 282 247   Basic Metabolic Panel:  Recent Labs Lab 10/21/16 1401 10/22/16 0612 10/27/16 0527 10/28/16 0505  NA  --  144 140 144  K  --  4.1 2.9* 3.1*  CL  --  112* 105 110  CO2  --  26 27 31   GLUCOSE  --  97 110* 109*  BUN  --  35* 29* 16  CREATININE 0.60* 1.09 1.33* 0.97  CALCIUM  --  8.4* 8.6* 7.9*   GFR: Coagulation Profile:  Recent Labs Lab 10/22/16 0612 10/23/16 0632 10/25/16 0700 10/27/16 0527 10/28/16 0505  INR 4.28* 3.16 1.75 2.13 2.34   Cardiac Enzymes:  Recent Labs Lab 10/21/16 1940 10/22/16 0015 10/22/16 0612  TROPONINI 0.05* 0.05* 0.03*   Thyroid  Function Tests:  Recent Labs  10/27/16 0527  TSH <0.010*   Sepsis Labs:  Recent Labs Lab 10/21/16 1236  LATICACIDVEN 1.20    Recent Results (from the past 240 hour(s))  Culture, Urine     Status: Abnormal   Collection Time: 10/20/16  2:30 AM  Result Value Ref Range Status   Specimen Description URINE, CLEAN CATCH  Final   Special Requests NONE  Final   Culture 60,000 COLONIES/mL CITROBACTER KOSERI (A)  Final   Report Status 10/22/2016 FINAL  Final   Organism ID, Bacteria CITROBACTER KOSERI (A)  Final      Susceptibility   Citrobacter koseri - MIC*    CEFAZOLIN >=64 RESISTANT Resistant     CEFTRIAXONE 32 INTERMEDIATE Intermediate     CIPROFLOXACIN >=4 RESISTANT Resistant     GENTAMICIN <=1 SENSITIVE Sensitive     IMIPENEM 1 SENSITIVE Sensitive     NITROFURANTOIN 64 INTERMEDIATE Intermediate     TRIMETH/SULFA <=20 SENSITIVE Sensitive     PIP/TAZO 32 INTERMEDIATE Intermediate     * 60,000 COLONIES/mL CITROBACTER KOSERI  Blood Culture (routine x 2)     Status: None   Collection Time: 10/21/16  9:14 AM  Result Value Ref Range Status   Specimen Description BLOOD RIGHT HAND  Final   Special Requests   Final    BOTTLES DRAWN AEROBIC AND ANAEROBIC Blood Culture adequate volume   Culture NO GROWTH 5 DAYS  Final   Report Status 10/26/2016 FINAL  Final  Blood Culture (routine x 2)     Status: None   Collection Time: 10/21/16  9:27 AM  Result Value Ref Range Status   Specimen Description BLOOD RIGHT FOREARM  Final   Special Requests   Final    BOTTLES DRAWN AEROBIC AND ANAEROBIC Blood Culture adequate volume   Culture NO GROWTH 5 DAYS  Final   Report Status 10/26/2016 FINAL  Final  MRSA PCR Screening     Status: None   Collection Time: 10/21/16  4:43 PM  Result Value Ref Range Status   MRSA by PCR NEGATIVE NEGATIVE Final    Comment:        The GeneXpert MRSA Assay (FDA approved for NASAL specimens only), is one component of a comprehensive MRSA colonization surveillance  program. It is not intended to diagnose MRSA infection nor to guide or monitor treatment for MRSA infections.      Radiology Studies: Ct Abdomen Pelvis Wo Contrast  Result Date: 10/27/2016 CLINICAL DATA:  Abdominal pain and distention.  Recurrent volvulus.  EXAM: CT ABDOMEN AND PELVIS WITHOUT CONTRAST TECHNIQUE: Multidetector CT imaging of the abdomen and pelvis was performed following the standard protocol without IV contrast. COMPARISON:  10/21/2016 FINDINGS: Lower chest: Atelectasis or consolidation in the lung bases, greater on the right. Can't exclude pneumonia. Coronary artery and aortic calcifications. Hepatobiliary: No focal liver abnormality is seen. No gallstones, gallbladder wall thickening, or biliary dilatation. Pancreas: Unremarkable. No pancreatic ductal dilatation or surrounding inflammatory changes. Spleen: Normal in size without focal abnormality. Adrenals/Urinary Tract: Adrenal glands are unremarkable. Kidneys are normal, without renal calculi, focal lesion, or hydronephrosis. Bladder is unremarkable. Stomach/Bowel: Stomach and small bowel are decompressed. There is prominent gaseous distention of the colon with scattered stool throughout the colon. Contrast material was instilled through a rectal tube. There is only a small amount of contrast material in the colon. There is a fairly tight area of narrowing at the rectosigmoid junction through which the catheter passes. The rectum itself is decompressed. Transition zone does not appear to be acutely twisted at this time and may have been reduced by the rectal catheter. There is suggestion of a stricture at this location with prominent proximal distention similar to previous study. No discrete mass lesion is appreciated. Vascular/Lymphatic: Aortic atherosclerosis. No enlarged abdominal or pelvic lymph nodes. Reproductive: Prostate is unremarkable. Other: No free air or free fluid in the abdomen. Moderate-sized periumbilical hernia  containing fat. No bowel herniation. Musculoskeletal: Degenerative changes in the spine. No destructive bone lesions. IMPRESSION: Prominent gaseous distention of the colon to the rectosigmoid junction where there appears to be a focal stricture. No acute twisting at the level of the stricture at this time. A rectal catheter was placed for contrast administration in the catheter passes through the stricture into the dilated colon. The rectum is decompressed. Electronically Signed   By: Burman NievesWilliam  Stevens M.D.   On: 10/27/2016 06:43   Dg Abd 1 View  Result Date: 10/28/2016 CLINICAL DATA:  Abdominal distension. EXAM: ABDOMEN - 1 VIEW COMPARISON:  10/27/2016 FINDINGS: There is persistent prominent gaseous distension of the sigmoid colon, estimated at 13 cm maximal diameter. The maximal of the sigmoid colon diameter has not significantly changed from yesterday's radiographs, however the descending and more proximal colon now appear nondilated with only a relatively small amount of gas present. Limited assessment for free air on this supine study. No dilated small bowel loops are seen. Bibasilar lung opacities are again partially visualized. Lumbar spondylosis is noted. IMPRESSION: Persistent gaseous distension of the sigmoid colon. Interval resolution of more proximal colonic distention. Electronically Signed   By: Sebastian AcheAllen  Grady M.D.   On: 10/28/2016 08:08   Dg Abdomen Acute W/chest  Result Date: 10/27/2016 CLINICAL DATA:  Abdominal pain and distention.  History of volvulus. EXAM: DG ABDOMEN ACUTE W/ 1V CHEST COMPARISON:  10/25/2016 FINDINGS: Shallow inspiration with linear atelectasis in the lung bases. Heart size and pulmonary vascularity are normal for technique. No pneumothorax. Calcified aorta. Diffuse gaseous distention of the colon is similar to previous study. 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is not excluded. No free intra-abdominal air is demonstrated. No radiopaque stones. Calcified phleboliths in the pelvis. Degenerative  changes in the spine. IMPRESSION: Shallow inspiration with atelectasis in the lung bases. Persistent diffuse gaseous distention of the colon similar to previous studies. Electronically Signed   By: Burman NievesWilliam  Stevens M.D.   On: 10/27/2016 04:41    Scheduled Meds: . artificial tears  1 application Left Eye QHS  . ipratropium  0.5 mg Nebulization Q6H  . labetalol  200 mg  Oral Daily  . lisinopril  20 mg Oral Daily  . mouth rinse  15 mL Mouth Rinse BID  . polyvinyl alcohol  1 drop Both Eyes BID  . predniSONE  40 mg Oral Q breakfast  . sulfamethoxazole-trimethoprim  20 mL Oral Q12H   Continuous Infusions: . dextrose 5 % and 0.9 % NaCl with KCl 40 mEq/L 75 mL/hr at 10/27/16 2330     LOS: 1 day   Navid Lenzen, MD FACP Hospitalist.   If 7PM-7AM, please contact night-coverage [email or web address] Password [email or web address] 10/28/2016, 12:05 PM

## 2016-10-28 NOTE — Progress Notes (Signed)
Patient made NPO last night - patient got very strangled with full liquid dinner (honey thick liquids).  Crackles in bilat lungs - wet, congested cough.  Awaiting speech eval

## 2016-10-28 NOTE — Progress Notes (Signed)
ANTICOAGULATION CONSULT NOTE - Initial Consult  Pharmacy Consult for coumadin Indication: DVT  Allergies  Allergen Reactions  . Penicillins     Has patient had a PCN reaction causing immediate rash, facial/tongue/throat swelling, SOB or lightheadedness with hypotension: unknown Has patient had a PCN reaction causing severe rash involving mucus membranes or skin necrosis: unknown Has patient had a PCN reaction that required hospitalization: unknown Has patient had a PCN reaction occurring within the last 10 years: unknown If all of the above answers are "NO", then may proceed with Cephalosporin use. 5/3 Tolerated Cefepime x 1 dose in ED.     Patient Measurements: Height: 5\' 10"  (177.8 cm) Weight: 205 lb (93 kg) IBW/kg (Calculated) : 73   Vital Signs: Temp: 98.1 F (36.7 C) (05/10 0651) Temp Source: Oral (05/10 0651) BP: 124/73 (05/10 0651) Pulse Rate: 86 (05/10 0651)  Labs:  Recent Labs  10/27/16 0527 10/28/16 0505  HGB 13.5 11.8*  HCT 41.7 37.6*  PLT 282 247  LABPROT 24.2* 26.1*  INR 2.13 2.34  CREATININE 1.33* 0.97    Estimated Creatinine Clearance: 81.2 mL/min (by C-G formula based on SCr of 0.97 mg/dL).   Medical History: Past Medical History:  Diagnosis Date  . Dysphasia   . Fatty liver   . Hemiplegia (HCC)   . Hypertension   . Pneumonia   . Pulmonary embolism (HCC)   . Stroke [facility](HCC)     Medications:  Prescriptions Prior to Admission  Medication Sig Dispense Refill Last Dose  . acetaminophen (TYLENOL) 325 MG tablet Take 650 mg by mouth every 6 (six) hours as needed for moderate pain.    10/25/2016 at Unknown time  . Eyelid Cleansers (SYSTANE LID WIPES EX) Apply 1 application topically 2 (two) times daily. Start occusoft lid scrubs: clean eyelids of oil and debris BID OU ( each eye ) before administering eye drops.    10/26/2016 at Unknown time  . ipratropium (ATROVENT) 0.02 % nebulizer solution Take 0.5 mg by nebulization every 6 (six) hours.   10/27/2016  at 0000  . labetalol (NORMODYNE) 200 MG tablet Take 200 mg by mouth daily. For HTN   10/26/2016 at Unknown time  . lactulose (CHRONULAC) 10 GM/15ML solution Take 10 g by mouth 2 (two) times daily.   10/26/2016 at Unknown time  . lisinopril (PRINIVIL,ZESTRIL) 20 MG tablet Take 20 mg by mouth daily.   10/26/2016 at Unknown time  . loratadine (CLARITIN) 10 MG tablet Take 10 mg by mouth daily.   10/26/2016 at Unknown time  . polyvinyl alcohol (LIQUIFILM TEARS) 1.4 % ophthalmic solution Place 1 drop into both eyes 2 (two) times daily.   10/26/2016 at 0900  . predniSONE (DELTASONE) 20 MG tablet Take 40 mg by mouth daily with breakfast.   10/26/2016 at Unknown time  . sennosides-docusate sodium (SENOKOT-S) 8.6-50 MG tablet Take 1 tablet by mouth at bedtime. For constipation   10/26/2016 at Unknown time  . sulfamethoxazole-trimethoprim (BACTRIM,SEPTRA) 200-40 MG/5ML suspension Take 20 mLs by mouth every 12 (twelve) hours. 100 mL 0 10/26/2016 at Unknown time  . warfarin (COUMADIN) 2 MG tablet Take 1 tablet (2 mg total) by mouth daily at 6 PM. Give 2 mg by mouth once a day   10/26/2016 at 1800  . Dextromethorphan-Guaifenesin (ROBITUSSIN DM PO) Take 15 mLs by mouth every 6 (six) hours as needed (cough).    unknown  . Emollient (CERAVE) CREA Apply 1 application topically daily as needed for itching once a day   unknown  .  polyethylene glycol (MIRALAX / GLYCOLAX) packet Take 17 g by mouth daily. 14 each 0 10/24/2016  . White Petrolatum-Mineral Oil (SYSTANE NIGHTTIME) OINT Place 1 application into the left eye at bedtime. Apply to left eye at night    10/21/2016    Assessment: 71 yo man to continue coumadin for h/o PE.  INR today 2.34.  Last dose of coumadin 5/8.  He is also on Bactrim starting 5/5 but has not been given here due to NPO status.    Goal of Therapy:  INR 2-3 Monitor platelets by anticoagulation protocol: Yes   Plan:  Coumadin 1mg  po today Daily PT/INR Monitor for bleeding complications D/C Bactrim?  Talbert Cage Poteet 10/28/2016,12:27 PM

## 2016-10-28 NOTE — Progress Notes (Signed)
Modified Barium Swallow Progress Note  Patient Details  Name: [name] [name] T Andujo MRN: [id number] Date of Birth: Feb 12, 1946  Today's Date: 10/28/2016  Modified Barium Swallow completed.  Full report located under Chart Review in the Imaging Section.  Brief recommendations include the following:  Clinical Impression  Pt presents with moderate oral phase dysphagia with reduced lip closure resulting in labial spillage with liquids, left buccal and lingual weakness resulting in decreased oral bolus cohesiveness with premature spillage of liquids; moderate pharyngeal phase dysphagia characterized by delay in swallow initiation with swallow trigger after filling the valleculae and spilling to pyriforms across consistencies and textures, reduced tongue base retraction, and reduced laryngeal closure resulting in penetration with nectars before and during the swallow as well as gross, silent aspiration of nectars after the swallow (delayed cough eventually elicited). SLP instructed Pt to cough and he was able to produce strong cough, however it is unlikely that he was able to remove all of aspirated material. Pt with improved safety with honey-thick liquids (no penetration or aspiration) and puree. Swallow trigger was at the level of the pyriforms with mechanical soft textures. Pt with mild vallecular and pyriform residue across consistencies/textures and benefited from repeat/dry swallow after initial bite/sip. Recommend D1/puree with HTL via cup sips. Crush meds as able in puree and no straws. Pt will need 100% supervision with all po intake. Recommend f/u at SNF for diet tolerance and reinforcement of strategies.    Swallow Evaluation Recommendations       SLP Diet Recommendations: Dysphagia 1 (Puree) solids;Honey thick liquids   Liquid Administration via: Cup;No straw   Medication Administration: Crushed with puree   Supervision: Full supervision/cueing for compensatory strategies   Compensations:  Slow rate;Multiple dry swallows after each bite/sip;Clear throat intermittently   Postural Changes: Remain semi-upright after after feeds/meals (Comment);Seated upright at 90 degrees   Oral Care Recommendations: Oral care BID;Staff/trained caregiver to provide oral care   Other Recommendations: Order thickener from pharmacy;Prohibited food (jello, ice cream, thin soups);Clarify dietary restrictions  Thank you,  Havery MorosDabney Porter, CCC-SLP [phone]  PORTER,DABNEY 10/28/2016,6:53 PM

## 2016-10-28 NOTE — Progress Notes (Signed)
  Subjective:  Patient denies nausea vomiting or abdominal pain. According to nursing staff he has been passing flatus and also pass large amount of liquid stool.  Objective: Blood pressure 124/66, pulse 84, temperature 97.5 F (36.4 C), temperature source Oral, resp. rate 18, height 5\' 10"  (1.778 m), weight 205 lb (93 kg), SpO2 100 %. Patient is alert and in no acute distress. He has ptosis involving left eye and left-sided facial weakness. His speech is impaired. Abdomen is full but not distended or tense anymore. Bowel sounds are normal. On palpation it's very soft and nontender. He has small umbilicus hernia which is easily reducible.  Labs/studies Results:   Recent Labs  10/27/16 0527 10/28/16 0505  WBC 14.2* 13.2*  HGB 13.5 11.8*  HCT 41.7 37.6*  PLT 282 247    BMET   Recent Labs  10/27/16 0527 10/28/16 0505  NA 140 144  K 2.9* 3.1*  CL 105 110  CO2 27 31  GLUCOSE 110* 109*  BUN 29* 16  CREATININE 1.33* 0.97  CALCIUM 8.6* 7.9*      Recent Labs  10/27/16 0527 10/28/16 0505  LABPROT 24.2* 26.1*  INR 2.13 2.34    KUB from this morning reveals significant reduction in colonic gas. Some gas noted in sigmoid colon.  Assessment:  #1. Recurrent sigmoid volvulus. He underwent endoscopic decompression twice and third time rectal tube did the trick. Rectal tube came out with his bowel movement. Abdominal exam is now normal. He is at risk for recurrent sigmoid volvulus. Patient is high risk for sigmoid colon resection. #2. Patient is nothing by mouth and speech pathology consultation has been requested as there is concern for aspiration. #3. Hypokalemia. He is receiving KCl and IV fluids.  Recommendations:  Place rectal tube while he is having diarrhea.

## 2016-10-28 NOTE — Care Management Note (Signed)
Case Management Note  Patient Details  Name: [name] [name] MRN: [id number] Date of Birth: 1945/07/04  Subjective/Objective:                  Pt [facility] with obstruction. [facility]   Action[facility]Plan[facility] CSW will follow and arrange for return to facility when ready.   Expected Discharge Date:       10/29/2016           Expected Discharge Plan:  Skilled Nursing Facility  In-House Referral:  Clinical Social Work  Discharge planning Services  CM Consult  Post Acute Care Choice:  NA Choice offered to:  NA  Status of Service:  Completed, signed off  Malcolm MetroChildress, Petrice Beedy Demske, RN 10/28/2016, 12:54 PM

## 2016-10-29 ENCOUNTER — Encounter (HOSPITAL_COMMUNITY)
Admission: RE | Admit: 2016-10-29 | Discharge: 2016-10-29 | Disposition: A | Discharge status: Home / Self Care | Payer: Medicare Other | Source: Skilled Nursing Facility | Attending: Internal Medicine | Admitting: Internal Medicine

## 2016-10-29 DIAGNOSIS — Z993 Dependence on wheelchair: Secondary | ICD-10-CM | POA: Insufficient documentation

## 2016-10-29 DIAGNOSIS — Z7901 Long term (current) use of anticoagulants: Secondary | ICD-10-CM | POA: Insufficient documentation

## 2016-10-29 DIAGNOSIS — R293 Abnormal posture: Secondary | ICD-10-CM | POA: Insufficient documentation

## 2016-10-29 DIAGNOSIS — G8191 Hemiplegia, unspecified affecting right dominant side: Secondary | ICD-10-CM | POA: Insufficient documentation

## 2016-10-29 DIAGNOSIS — R140 Abdominal distension (gaseous): Secondary | ICD-10-CM

## 2016-10-29 DIAGNOSIS — I10 Essential (primary) hypertension: Secondary | ICD-10-CM | POA: Insufficient documentation

## 2016-10-29 DIAGNOSIS — I69328 Other speech and language deficits following cerebral infarction: Secondary | ICD-10-CM | POA: Insufficient documentation

## 2016-10-29 LAB — BASIC METABOLIC PANEL
ANION GAP: 4 — AB (ref 5–15)
BUN: 9 mg/dL (ref 6–20)
CALCIUM: 7.9 mg/dL — AB (ref 8.9–10.3)
CO2: 28 mmol/L (ref 22–32)
CREATININE: 0.89 mg/dL (ref 0.61–1.24)
Chloride: 115 mmol/L — ABNORMAL HIGH (ref 101–111)
GFR calc Af Amer: >60 mL/min (ref >60)
GLUCOSE: 96 mg/dL (ref 65–99)
Potassium: 2.6 mmol/L — CL (ref 3.5–5.1)
Sodium: 147 mmol/L — ABNORMAL HIGH (ref 135–145)

## 2016-10-29 LAB — MAGNESIUM: Magnesium: 1.6 mg/dL — ABNORMAL LOW (ref 1.7–2.4)

## 2016-10-29 LAB — CBC
HEMATOCRIT: 37.4 % — AB (ref 39.0–52.0)
Hemoglobin: 12.1 g/dL — ABNORMAL LOW (ref 13.0–17.0)
MCH: 30.1 pg (ref 26.0–34.0)
MCHC: 32.4 g/dL (ref 30.0–36.0)
MCV: 93 fL (ref 78.0–100.0)
PLATELETS: 263 10*3/uL (ref 150–400)
RBC: 4.02 MIL/uL — ABNORMAL LOW (ref 4.22–5.81)
RDW: 13.5 % (ref 11.5–15.5)
WBC: 9.9 10*3/uL (ref 4.0–10.5)

## 2016-10-29 LAB — PROTIME-INR
INR: 3.02
Prothrombin Time: 32 seconds — ABNORMAL HIGH (ref 11.4–15.2)

## 2016-10-29 MED ORDER — MAGNESIUM SULFATE 2 GM/50ML IV SOLN
2.0000 g | Freq: Once | INTRAVENOUS | Status: AC
  Administered 2016-10-29: 2 g via INTRAVENOUS
  Filled 2016-10-29: qty 50

## 2016-10-29 MED ORDER — POTASSIUM CHLORIDE 10 MEQ/100ML IV SOLN
10.0000 meq | INTRAVENOUS | Status: AC
  Administered 2016-10-29 (×3): 10 meq via INTRAVENOUS
  Filled 2016-10-29 (×3): qty 100

## 2016-10-29 MED ORDER — POTASSIUM CHLORIDE CRYS ER 20 MEQ PO TBCR
30.0000 meq | EXTENDED_RELEASE_TABLET | Freq: Once | ORAL | Status: AC
  Administered 2016-10-29: 07:00:00 30 meq via ORAL
  Filled 2016-10-29: qty 1

## 2016-10-29 NOTE — Progress Notes (Signed)
ANTICOAGULATION CONSULT NOTE   Pharmacy Consult for coumadin Indication: DVT  Allergies  Allergen Reactions  . Penicillins     Has patient had a PCN reaction causing immediate rash, facial/tongue/throat swelling, SOB or lightheadedness with hypotension: unknown Has patient had a PCN reaction causing severe rash involving mucus membranes or skin necrosis: unknown Has patient had a PCN reaction that required hospitalization: unknown Has patient had a PCN reaction occurring within the last 10 years: unknown If all of the above answers are "NO", then may proceed with Cephalosporin use. 5/3 Tolerated Cefepime x 1 dose in ED.     Patient Measurements: Height: 5\' 10"  (177.8 cm) Weight: 205 lb (93 kg) IBW/kg (Calculated) : 73   Vital Signs: Temp: 98.7 F (37.1 C) (05/11 0431) Temp Source: Oral (05/11 0431) BP: 142/72 (05/11 0431) Pulse Rate: 84 (05/11 0431)  Labs:  Recent Labs  10/27/16 0527 10/28/16 0505 10/29/16 0412  HGB 13.5 11.8* 12.1*  HCT 41.7 37.6* 37.4*  PLT 282 247 263  LABPROT 24.2* 26.1* 32.0*  INR 2.13 2.34 3.02  CREATININE 1.33* 0.97 0.89    Estimated Creatinine Clearance: 88.5 mL/min (by C-G formula based on SCr of 0.89 mg/dL).   Medical History: Past Medical History:  Diagnosis Date  . Dysphasia   . Fatty liver   . Hemiplegia (HCC)   . Hypertension   . Pneumonia   . Pulmonary embolism (HCC)   . Stroke [facility])     Medications:  Prescriptions Prior to Admission  Medication Sig Dispense Refill Last Dose  . acetaminophen (TYLENOL) 325 MG tablet Take 650 mg by mouth every 6 (six) hours as needed for moderate pain.    10/25/2016 at Unknown time  . Eyelid Cleansers (SYSTANE LID WIPES EX) Apply 1 application topically 2 (two) times daily. Start occusoft lid scrubs: clean eyelids of oil and debris BID OU ( each eye ) before administering eye drops.    10/27/2016 at Unknown time  . ipratropium (ATROVENT) 0.02 % nebulizer solution Take 0.5 mg by nebulization  every 6 (six) hours.   10/27/2016 at 0000  . labetalol (NORMODYNE) 200 MG tablet Take 200 mg by mouth daily. For HTN   10/26/2016 at Unknown time  . lactulose (CHRONULAC) 10 GM/15ML solution Take 10 g by mouth 2 (two) times daily.   10/26/2016 at Unknown time  . lisinopril (PRINIVIL,ZESTRIL) 20 MG tablet Take 20 mg by mouth daily.   10/26/2016 at Unknown time  . loratadine (CLARITIN) 10 MG tablet Take 10 mg by mouth daily.   10/26/2016 at Unknown time  . polyvinyl alcohol (LIQUIFILM TEARS) 1.4 % ophthalmic solution Place 1 drop into both eyes 2 (two) times daily.   10/26/2016 at 0900  . predniSONE (DELTASONE) 20 MG tablet Take 40 mg by mouth daily with breakfast.   10/26/2016 at Unknown time  . sennosides-docusate sodium (SENOKOT-S) 8.6-50 MG tablet Take 1 tablet by mouth at bedtime. For constipation   10/26/2016 at Unknown time  . [EXPIRED] sulfamethoxazole-trimethoprim (BACTRIM,SEPTRA) 200-40 MG/5ML suspension Take 20 mLs by mouth every 12 (twelve) hours. 100 mL 0 10/26/2016 at Unknown time  . warfarin (COUMADIN) 2 MG tablet Take 1 tablet (2 mg total) by mouth daily at 6 PM. Give 2 mg by mouth once a day   10/26/2016 at 1800  . Dextromethorphan-Guaifenesin (ROBITUSSIN DM PO) Take 15 mLs by mouth every 6 (six) hours as needed (cough).    unknown  . Emollient (CERAVE) CREA Apply 1 application topically daily as needed for itching  once a day   unknown  . polyethylene glycol (MIRALAX / GLYCOLAX) packet Take 17 g by mouth daily. 14 each 0 10/24/2016  . White Petrolatum-Mineral Oil (SYSTANE NIGHTTIME) OINT Place 1 application into the left eye at bedtime. Apply to left eye at night    10/21/2016    Assessment: 71 yo man to continue coumadin for h/o PE.  INR today 3.02.  He is very sensitive to coumadin with his GI issues and NPO status  Goal of Therapy:  INR 2-3 Monitor platelets by anticoagulation protocol: Yes   Plan:  No coumadin today Daily PT/INR Monitor for bleeding complications  [name] [name]  Poteet 10/29/2016,8:34 AM

## 2016-10-29 NOTE — Progress Notes (Signed)
Critical Potassium 2.6-informed MD. New orders placed & followed.

## 2016-10-29 NOTE — Progress Notes (Signed)
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  He is unlikely to require surgery, but he did have aspiration yesterday and was made NPO pending speech re evaluation.  Speech made D1/HTL and he was able to tolerate it.  He has some liquid stool, and Dr Renae Fickleheman is discontinuing his rectal tube today.  He will likely be able to be discharged tomorrow.    Assessment & Plan:   Principal Problem:   Colonic obstruction (HCC) Active Problems:   Stroke (HCC)   Pulmonary embolism (HCC)   Hypertension   Hemiplegia (HCC)   Hypokalemia   Pressure injury of skin  Colonic obstruction: Recent resolved volvulus. CT today showed suspicion of distal colonic stricture with significant gasous distention, but he has improved, and did not require NGT.  He was advanced on his diet, unfortunately, he may have some aspiration.   He was made NPO again, and we will request speech's re-eval and wil follow recommendation. He is a poor surgical candidate, and unlikely to require surgery, so will restart his Coumadin per pharmacy's dosing.   Hypokalemia; Will supplement.   HTN: Continue with his meds. Place on telemetry.  Prior CVA: Verbal paucity. Slight dysarthria.   Hx of PE: Will benefit with longterm anticoagulation. Coumadin held, will restart it today.  DVT prophylaxis: Coumadin.  Code Status: FULL CODE.  Family Communication: None.  Disposition Plan: SNF.  Consultants:   GI and surgery.    Procedures:   Rectal tube decompression.  Antimicrobials: Anti-infectives    Start     Dose/Rate Route Frequency Ordered Stop   10/27/16 1130  sulfamethoxazole-trimethoprim (BACTRIM,SEPTRA)  200-40 MG/5ML suspension 20 mL  Status:  Discontinued     20 mL Oral Every 12 hours 10/27/16 1116 10/28/16 1256       Subjective:  No complaints.   Objective: Vitals:   10/29/16 0431 10/29/16 0819 10/29/16 1349 10/29/16 1400  BP: (!) 142/72   128/74  Pulse: 84   78  Resp: 18   19  Temp: 98.7 F (37.1 C)   98.7 F (37.1 C)  TempSrc: Oral   Oral  SpO2: 97% 95% 95% 98%  Weight:      Height:        Intake/Output Summary (Last 24 hours) at 10/29/16 1657 Last data filed at 10/29/16 1549  Gross per 24 hour  Intake          4313.75 ml  Output                0 ml  Net          4313.75 ml   Filed Weights   10/27/16 0342 10/27/16 1053  Weight: 93 kg (205 lb) 93 kg (205 lb)    Examination:  General exam: Appears calm and comfortable  Respiratory system: Clear to auscultation. Respiratory effort normal. Cardiovascular system: S1 & S2 heard, RRR. No JVD, murmurs, rubs, gallops or clicks. No pedal edema. Gastrointestinal system: Abdomen is nondistended, soft and nontender. No organomegaly or masses felt. Normal bowel sounds heard. Central nervous system: Alert and oriented. No focal neurological deficits. Extremities: Symmetric 5 x 5 power. Skin: No rashes, lesions or ulcers Psychiatry: Judgement and insight appear normal. Mood &  affect appropriate.   Data Reviewed: I have personally reviewed following labs and imaging studies  CBC:  Recent Labs Lab 10/27/16 0527 10/28/16 0505 10/29/16 0412  WBC 14.2* 13.2* 9.9  NEUTROABS 10.3*  --   --   HGB 13.5 11.8* 12.1*  HCT 41.7 37.6* 37.4*  MCV 91.2 93.8 93.0  PLT 282 247 263   Basic Metabolic Panel:  Recent Labs Lab 10/27/16 0527 10/28/16 0505 10/29/16 0412  NA 140 144 147*  K 2.9* 3.1* 2.6*  CL 105 110 115*  CO2 27 31 28   GLUCOSE 110* 109* 96  BUN 29* 16 9  CREATININE 1.33* 0.97 0.89  CALCIUM 8.6* 7.9* 7.9*  MG  --   --  1.6*   Coagulation Profile:  Recent Labs Lab 10/23/16 0632 10/25/16 0700  10/27/16 0527 10/28/16 0505 10/29/16 0412  INR 3.16 1.75 2.13 2.34 3.02   Thyroid Function Tests:  Recent Labs  10/27/16 0527  TSH <0.010*    Recent Results (from the past 240 hour(s))  Culture, Urine     Status: Abnormal   Collection Time: 10/20/16  2:30 AM  Result Value Ref Range Status   Specimen Description URINE, CLEAN CATCH  Final   Special Requests NONE  Final   Culture 60,000 COLONIES/mL CITROBACTER KOSERI (A)  Final   Report Status 10/22/2016 FINAL  Final   Organism ID, Bacteria CITROBACTER KOSERI (A)  Final      Susceptibility   Citrobacter koseri - MIC*    CEFAZOLIN >=64 RESISTANT Resistant     CEFTRIAXONE 32 INTERMEDIATE Intermediate     CIPROFLOXACIN >=4 RESISTANT Resistant     GENTAMICIN <=1 SENSITIVE Sensitive     IMIPENEM 1 SENSITIVE Sensitive     NITROFURANTOIN 64 INTERMEDIATE Intermediate     TRIMETH/SULFA <=20 SENSITIVE Sensitive     PIP/TAZO 32 INTERMEDIATE Intermediate     * 60,000 COLONIES/mL CITROBACTER KOSERI  Blood Culture (routine x 2)     Status: None   Collection Time: 10/21/16  9:14 AM  Result Value Ref Range Status   Specimen Description BLOOD RIGHT HAND  Final   Special Requests   Final    BOTTLES DRAWN AEROBIC AND ANAEROBIC Blood Culture adequate volume   Culture NO GROWTH 5 DAYS  Final   Report Status 10/26/2016 FINAL  Final  Blood Culture (routine x 2)     Status: None   Collection Time: 10/21/16  9:27 AM  Result Value Ref Range Status   Specimen Description BLOOD RIGHT FOREARM  Final   Special Requests   Final    BOTTLES DRAWN AEROBIC AND ANAEROBIC Blood Culture adequate volume   Culture NO GROWTH 5 DAYS  Final   Report Status 10/26/2016 FINAL  Final  MRSA PCR Screening     Status: None   Collection Time: 10/21/16  4:43 PM  Result Value Ref Range Status   MRSA by PCR NEGATIVE NEGATIVE Final    Comment:        The GeneXpert MRSA Assay (FDA approved for NASAL specimens only), is one component of a comprehensive MRSA  colonization surveillance program. It is not intended to diagnose MRSA infection nor to guide or monitor treatment for MRSA infections.      Radiology Studies: Dg Abd 1 View  Result Date: 10/28/2016 CLINICAL DATA:  Abdominal distension. EXAM: ABDOMEN - 1 VIEW COMPARISON:  10/27/2016 FINDINGS: There is persistent prominent gaseous distension of the sigmoid colon, estimated at 13 cm maximal diameter. The maximal of the sigmoid colon  diameter has not significantly changed from yesterday's radiographs, however the descending and more proximal colon now appear nondilated with only a relatively small amount of gas present. Limited assessment for free air on this supine study. No dilated small bowel loops are seen. Bibasilar lung opacities are again partially visualized. Lumbar spondylosis is noted. IMPRESSION: Persistent gaseous distension of the sigmoid colon. Interval resolution of more proximal colonic distention. Electronically Signed   By: Sebastian Ache M.D.   On: 10/28/2016 08:08   Dg Swallowing Func-speech Pathology  Result Date: 10/28/2016 Objective Swallowing Evaluation: Type of Study: MBS-Modified Barium Swallow Study Patient Details Name: [name] [name] MRN: [id number] Date of Birth: 07-03-1945 Today's Date: 10/28/2016 Time: SLP Start Time (ACUTE ONLY): 1704-SLP Stop Time (ACUTE ONLY): 1736 SLP Time Calculation (min) (ACUTE ONLY): 32 min Past Medical History: Past Medical History: Diagnosis Date . Dysphasia  . Fatty liver  . Hemiplegia (HCC)  . Hypertension  . Pneumonia  . Pulmonary embolism (HCC)  . Stroke [facility])  Past Surgical History: Past Surgical History: Procedure Laterality Date . FLEXIBLE SIGMOIDOSCOPY N/A 10/21/2016  Procedure: FLEXIBLE SIGMOIDOSCOPY;  Surgeon: Malissa Hippo, MD;  Location: [facility];  Service: Endoscopy;  Laterality: N/A; . unable   HPI: [employer] is an 71 y.o. 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  He is unlikely to require surgery, but he did have aspiration yesterday and was made NPO pending speech re evaluation.   Subjective: "Ok" Assessment / Plan / Recommendation CHL IP CLINICAL IMPRESSIONS 10/28/2016 Clinical Impression Pt presents with moderate oral phase dysphagia with reduced lip closure resulting in labial spillage with liquids, left buccal and lingual weakness resulting in decreased oral bolus cohesiveness with premature spillage of liquids; moderate pharyngeal phase dysphagia characterized by delay in swallow initiation with swallow trigger after filling the valleculae and spilling to pyriforms across consistencies and textures, reduced tongue base retraction, and reduced laryngeal closure resulting in penetration with nectars before and during the swallow as well as gross, silent aspiration of nectars after the swallow (delayed cough eventually elicited). SLP instructed Pt to cough and he was able to produce strong cough, however it is unlikely that he was able to remove all of aspirated material. Pt with improved safety with honey-thick liquids (no penetration or aspiration) and puree. Swallow trigger was at the level of the pyriforms with mechanical soft textures. Pt with mild vallecular and pyriform residue across consistencies/textures and benefited from repeat/dry swallow after initial bite/sip. Recommend D1/puree with HTL via cup sips. Crush meds as able in puree and no straws. Pt will need 100% supervision with all po intake. Recommend f/u at SNF for diet tolerance and reinforcement of strategies.  SLP Visit Diagnosis Dysphagia, oropharyngeal phase (R13.12) Attention and concentration deficit following -- Frontal lobe and executive function deficit following -- Impact on safety and function Moderate aspiration risk   CHL IP TREATMENT RECOMMENDATION 10/28/2016 Treatment Recommendations Defer treatment  plan to f/u with SLP   Prognosis 10/28/2016 Prognosis for Safe Diet Advancement Guarded Barriers to Reach Goals Time post onset;Severity of deficits Barriers/Prognosis Comment -- CHL IP DIET RECOMMENDATION 10/28/2016 SLP Diet Recommendations Dysphagia 1 (Puree) solids;Honey thick liquids Liquid Administration via Cup;No straw Medication Administration Crushed with puree Compensations Slow rate;Multiple dry swallows after each bite/sip;Clear throat intermittently Postural Changes Remain semi-upright after after feeds/meals (Comment);Seated upright at 90 degrees  CHL IP OTHER RECOMMENDATIONS 10/28/2016 Recommended Consults -- Oral Care Recommendations Oral care BID;Staff/trained caregiver to provide oral care Other Recommendations Order thickener from pharmacy;Prohibited food (jello, ice cream, thin soups);Clarify dietary restrictions   CHL IP FOLLOW UP RECOMMENDATIONS 10/28/2016 Follow up Recommendations Skilled Nursing facility   [facility] IP FREQUENCY AND DURATION 10/22/2016 Speech Therapy Frequency (ACUTE ONLY) min 2x/week Treatment Duration 2 weeks      CHL IP ORAL PHASE 10/28/2016 Oral Phase Impaired Oral - Pudding Teaspoon -- Oral - Pudding Cup -- Oral - Honey Teaspoon -- Oral - Honey Cup -- Oral - Nectar Teaspoon -- Oral - Nectar Cup Lingual/palatal residue;Piecemeal swallowing;Decreased bolus cohesion;Premature spillage Oral - Nectar Straw -- Oral - Thin Teaspoon -- Oral - Thin Cup -- Oral - Thin Straw -- Oral - Puree -- Oral - Mech Soft Impaired mastication;Piecemeal swallowing;Delayed oral transit;Decreased bolus cohesion Oral - Regular -- Oral - Multi-Consistency -- Oral - Pill -- Oral Phase - Comment --  CHL IP PHARYNGEAL PHASE 10/28/2016 Pharyngeal Phase Impaired Pharyngeal- Pudding Teaspoon -- Pharyngeal -- Pharyngeal- Pudding Cup -- Pharyngeal -- Pharyngeal- Honey Teaspoon -- Pharyngeal -- Pharyngeal- Honey Cup Delayed swallow initiation-vallecula;Pharyngeal residue - valleculae;Reduced tongue base retraction  Pharyngeal -- Pharyngeal- Nectar Teaspoon -- Pharyngeal -- Pharyngeal- Nectar Cup Delayed swallow initiation-vallecula;Delayed swallow initiation-pyriform sinuses;Reduced epiglottic inversion;Reduced airway/laryngeal closure;Reduced tongue base retraction;Penetration/Aspiration before swallow;Penetration/Aspiration during swallow;Penetration/Apiration after swallow;Significant aspiration (Amount);Pharyngeal residue - valleculae;Pharyngeal residue - pyriform Pharyngeal Material enters airway, passes BELOW cords without attempt by patient to eject out (silent aspiration);Material enters airway, passes BELOW cords and not ejected out despite cough attempt by patient;Material enters airway, CONTACTS cords and then ejected out Pharyngeal- Nectar Straw -- Pharyngeal -- Pharyngeal- Thin Teaspoon -- Pharyngeal -- Pharyngeal- Thin Cup -- Pharyngeal -- Pharyngeal- Thin Straw -- Pharyngeal -- Pharyngeal- Puree Delayed swallow initiation-vallecula;Reduced tongue base retraction;Pharyngeal residue - valleculae Pharyngeal -- Pharyngeal- Mechanical Soft Pharyngeal residue - valleculae;Delayed swallow initiation-pyriform sinuses;Reduced epiglottic inversion;Reduced tongue base retraction Pharyngeal -- Pharyngeal- Regular -- Pharyngeal -- Pharyngeal- Multi-consistency -- Pharyngeal -- Pharyngeal- Pill -- Pharyngeal -- Pharyngeal Comment --  CHL IP CERVICAL ESOPHAGEAL PHASE 10/28/2016 Cervical Esophageal Phase WFL Pudding Teaspoon -- Pudding Cup -- Honey Teaspoon -- Honey Cup -- Nectar Teaspoon -- Nectar Cup -- Nectar Straw -- Thin Teaspoon -- Thin Cup -- Thin Straw -- Puree -- Mechanical Soft -- Regular -- Multi-consistency -- Pill -- Cervical Esophageal Comment -- Thank you, Havery Moros, CCC-SLP [phone] No flowsheet data found. PORTER,DABNEY 10/28/2016, 6:55 PM               Scheduled Meds: . artificial tears  1 application Left Eye QHS  . ipratropium  0.5 mg Nebulization Q6H  . labetalol  200 mg Oral Daily  .  lisinopril  20 mg Oral Daily  . mouth rinse  15 mL Mouth Rinse BID  . polyvinyl alcohol  1 drop Both Eyes BID  . predniSONE  40 mg Oral Q breakfast  . Warfarin - Pharmacist Dosing Inpatient   Does not apply q1800   Continuous Infusions: . dextrose 5 % and 0.9 % NaCl with KCl 40 mEq/L Stopped (10/29/16 1549)     LOS: 2 days   Mechel Haggard, MD FACP Hospitalist.   If 7PM-7AM, please contact night-coverage [email or web address] Password [facility] 10/29/2016, 4:57 PM

## 2016-10-29 NOTE — Progress Notes (Signed)
Patient has no complaints. He denies nausea vomiting abdominal pain. According to nursing staff he ate all of his lunch. Abdomen is full with normal bowel sounds. On palpation is soft and nontender. 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  Rectal tube has thick liquid stool.  Assessment:   Recurrent sigmoid volvulus. He underwent endoscopic decompression twice and third time he was decompressed with red rubber catheter. Rectal tube will be removed later today..  Recommendations:  Continue polyethylene glycol 17 g by mouth daily at bedtime. No lactulose or stimulant laxatives. Patient needs to be assisted to bedside commode twice daily. Can use 18 French red rubber catheter for decompression if volvulus recurs.  Will sign off.

## 2016-10-30 LAB — CBC
HCT: 37.6 % — ABNORMAL LOW (ref 39.0–52.0)
Hemoglobin: 12.1 g/dL — ABNORMAL LOW (ref 13.0–17.0)
MCH: 29.7 pg (ref 26.0–34.0)
MCHC: 32.2 g/dL (ref 30.0–36.0)
MCV: 92.2 fL (ref 78.0–100.0)
Platelets: 280 10*3/uL (ref 150–400)
RBC: 4.08 MIL/uL — ABNORMAL LOW (ref 4.22–5.81)
RDW: 13.3 % (ref 11.5–15.5)
WBC: 12.3 10*3/uL — ABNORMAL HIGH (ref 4.0–10.5)

## 2016-10-30 LAB — BASIC METABOLIC PANEL
Anion gap: 4 — ABNORMAL LOW (ref 5–15)
BUN: 12 mg/dL (ref 6–20)
CHLORIDE: 114 mmol/L — AB (ref 101–111)
CO2: 27 mmol/L (ref 22–32)
Calcium: 7.9 mg/dL — ABNORMAL LOW (ref 8.9–10.3)
Creatinine, Ser: 0.79 mg/dL (ref 0.61–1.24)
GFR calc Af Amer: >60 mL/min (ref >60)
GFR calc non Af Amer: >60 mL/min (ref >60)
GLUCOSE: 104 mg/dL — AB (ref 65–99)
POTASSIUM: 2.7 mmol/L — AB (ref 3.5–5.1)
Sodium: 145 mmol/L (ref 135–145)

## 2016-10-30 LAB — PROTIME-INR
INR: 1.9
Prothrombin Time: 22.1 seconds — ABNORMAL HIGH (ref 11.4–15.2)

## 2016-10-30 LAB — POTASSIUM: Potassium: 3.5 mmol/L (ref 3.5–5.1)

## 2016-10-30 MED ORDER — MAGNESIUM SULFATE 2 GM/50ML IV SOLN
2.0000 g | Freq: Once | INTRAVENOUS | Status: AC
  Administered 2016-10-30: 2 g via INTRAVENOUS
  Filled 2016-10-30: qty 50

## 2016-10-30 MED ORDER — POTASSIUM CHLORIDE 10 MEQ/100ML IV SOLN
10.0000 meq | INTRAVENOUS | Status: AC
  Administered 2016-10-30 (×4): 10 meq via INTRAVENOUS
  Filled 2016-10-30 (×4): qty 100

## 2016-10-30 MED ORDER — WARFARIN SODIUM 1 MG PO TABS
1.0000 mg | ORAL_TABLET | Freq: Once | ORAL | Status: AC
  Administered 2016-10-30: 1 mg via ORAL
  Filled 2016-10-30: qty 1

## 2016-10-30 MED ORDER — WARFARIN - PHARMACIST DOSING INPATIENT
Status: DC
  Administered 2016-10-30: 16:00:00

## 2016-10-30 MED ORDER — POTASSIUM CHLORIDE ER 10 MEQ PO TBCR: 10.0000 meq | EXTENDED_RELEASE_TABLET | Freq: Three times a day (TID) | ORAL | 1 refills | Status: DC

## 2016-10-30 NOTE — Progress Notes (Addendum)
Pt is discharged per Dr.  Jeanene Erballed SW on call for the weekend, left VM, [facility]   Paged Dr Conley RollsLe via Loretha StaplerAMION to notify awaiting SW call back  No return call back from SW, will work on in the AM.  Dr Conley RollsLe notified.

## 2016-10-30 NOTE — Progress Notes (Signed)
ANTICOAGULATION CONSULT NOTE   Pharmacy Consult for coumadin Indication: DVT  Allergies  Allergen Reactions  . Penicillins     Has patient had a PCN reaction causing immediate rash, facial/tongue/throat swelling, SOB or lightheadedness with hypotension: unknown Has patient had a PCN reaction causing severe rash involving mucus membranes or skin necrosis: unknown Has patient had a PCN reaction that required hospitalization: unknown Has patient had a PCN reaction occurring within the last 10 years: unknown If all of the above answers are "NO", then may proceed with Cephalosporin use. 5/3 Tolerated Cefepime x 1 dose in ED.     Patient Measurements: Height: 5\' 10"  (177.8 cm) Weight: 205 lb (93 kg) IBW/kg (Calculated) : 73   Vital Signs: Temp: 97.4 F (36.3 C) (05/12 0550) Temp Source: Oral (05/12 0550) BP: 118/56 (05/12 0550) Pulse Rate: 77 (05/12 0550)  Labs:  Recent Labs  10/28/16 0505 10/29/16 0412 10/30/16 0603  HGB 11.8* 12.1* 12.1*  HCT 37.6* 37.4* 37.6*  PLT 247 263 280  LABPROT 26.1* 32.0* 22.1*  INR 2.34 3.02 1.90  CREATININE 0.97 0.89 0.79    Estimated Creatinine Clearance: 98.4 mL/min (by C-G formula based on SCr of 0.79 mg/dL).   Medical History: Past Medical History:  Diagnosis Date  . Dysphasia   . Fatty liver   . Hemiplegia (HCC)   . Hypertension   . Pneumonia   . Pulmonary embolism (HCC)   . Stroke [facility])     Medications:  Prescriptions Prior to Admission  Medication Sig Dispense Refill Last Dose  . acetaminophen (TYLENOL) 325 MG tablet Take 650 mg by mouth every 6 (six) hours as needed for moderate pain.    10/25/2016 at Unknown time  . Eyelid Cleansers (SYSTANE LID WIPES EX) Apply 1 application topically 2 (two) times daily. Start occusoft lid scrubs: clean eyelids of oil and debris BID OU ( each eye ) before administering eye drops.    10/27/2016 at Unknown time  . ipratropium (ATROVENT) 0.02 % nebulizer solution Take 0.5 mg by nebulization  every 6 (six) hours.   10/27/2016 at 0000  . labetalol (NORMODYNE) 200 MG tablet Take 200 mg by mouth daily. For HTN   10/26/2016 at Unknown time  . lactulose (CHRONULAC) 10 GM/15ML solution Take 10 g by mouth 2 (two) times daily.   10/26/2016 at Unknown time  . lisinopril (PRINIVIL,ZESTRIL) 20 MG tablet Take 20 mg by mouth daily.   10/26/2016 at Unknown time  . loratadine (CLARITIN) 10 MG tablet Take 10 mg by mouth daily.   10/26/2016 at Unknown time  . polyvinyl alcohol (LIQUIFILM TEARS) 1.4 % ophthalmic solution Place 1 drop into both eyes 2 (two) times daily.   10/26/2016 at 0900  . predniSONE (DELTASONE) 20 MG tablet Take 40 mg by mouth daily with breakfast.   10/26/2016 at Unknown time  . sennosides-docusate sodium (SENOKOT-S) 8.6-50 MG tablet Take 1 tablet by mouth at bedtime. For constipation   10/26/2016 at Unknown time  . [EXPIRED] sulfamethoxazole-trimethoprim (BACTRIM,SEPTRA) 200-40 MG/5ML suspension Take 20 mLs by mouth every 12 (twelve) hours. 100 mL 0 10/26/2016 at Unknown time  . warfarin (COUMADIN) 2 MG tablet Take 1 tablet (2 mg total) by mouth daily at 6 PM. Give 2 mg by mouth once a day   10/26/2016 at 1800  . Dextromethorphan-Guaifenesin (ROBITUSSIN DM PO) Take 15 mLs by mouth every 6 (six) hours as needed (cough).    unknown  . Emollient (CERAVE) CREA Apply 1 application topically daily as needed for itching  once a day   unknown  . polyethylene glycol (MIRALAX / GLYCOLAX) packet Take 17 g by mouth daily. 14 each 0 10/24/2016  . White Petrolatum-Mineral Oil (SYSTANE NIGHTTIME) OINT Place 1 application into the left eye at bedtime. Apply to left eye at night    10/21/2016    Assessment: 71 yo man to continue coumadin for h/o PE.   He is very sensitive to coumadin with his GI issues and NPO status. Diet - DYS 1, aspiration cautions. INR 1.9 today after no Coumadin yesterday.   Goal of Therapy:  INR 2-3 Monitor platelets by anticoagulation protocol: Yes   Plan:  Coumadin 1 mg po x 1 dose  today. Daily PT/INR Monitor for bleeding complications  Raquel Jamesittman, Neriyah Cercone Bennett 10/30/2016,9:27 AM

## 2016-10-30 NOTE — Discharge Summary (Signed)
Physician Discharge Summary  [name] [name] ZOX:[id number] DOB: Apr 19, 1946 DOA: 10/27/2016  PCP: Mahlon Gammon, MD  Admit date: 10/27/2016 Discharge date: 10/30/2016  Admitted From: SNF Disposition:  SNF  Recommendations for Outpatient Follow-up:  1. Follow up with PCP in 1-2 weeks 2. Check Basic metabolic profile, Mag, and INR on Monday.   Home Health: None.  Equipment/Devices: None.  Discharge Condition: No abdominal pain, distention, nausea, or vomiting.  K of 3.5 mE/L.  CODE STATUS:FULL CODE>  Diet recommendation: D1, with HTL.   Watch patient while eating.  Sit upright.  Eat slow and clear mouth with each bites.   Brief/Interim Summary: Patient was admitted by me on Oct 27, 2016 for abdominal distention.  As per my prior H and P:  " [name] [name] is an 71 y.o. 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  He was seen by Dr Lovell Sheehan and Dr Renae Fickle, and had Flex sig with successful decompression of his volvulus.  At that time, he may have had requried surgery, but would be a poor surgical candidate given multiple co morbities.  Evaluation in the ER today showed persistent leukocytosis with WBC of 14K, Cr of 1.3 and K of 2.9.  He was given IV K supplement. A CT showed suspicious for rectosigmoid stricture, though the catheter was able to be passed thru, and no evidence of volvulus.  EDP spoke with Dr Renae Fickle, who will see patient today, and hospitalist was asked to admit him for same.    HOSPITAL COURSE:  Patient was admitted and we had surgery and GI consultations.  Dr Renae Fickle felt that his CT scan was the same as previously, and that he has current volvulus.  He did not require surgery, and his Coumadin, held on admission, was resumed.  Pharmacy warned that he is sensitive to coumadin, especially when he was NPO, so he was given 1mg  of Coumadin.  He will resume his 2mg  per day as  before, now that he can eat.  But we should check it carefully.  His abdominal distention resolved when GI placed a rectal tube.  Dr Renae Fickle OF GI RECOMMENDED THAT A FRENCH 18 G RED RUBBER CATHETER BE PLACED FOR DECOMPRESSION IF HE SHOULD HAVE VOLVULUS AGAIN.  He should be assisted to bedside commode BID, and continue miralax at bedtime daily.   Don't use lactulose or stimulant laxative.  He was found to have low K, and he was repleted.  His last K was 3.5.  But he will need daily K supplement, as his total body K deficit is huge.  He was given 2 g of IV Mag, though his level was not that low.  I spoke with his brother Gabriel Rung, and we are in agreement he is now ready for discharge back to his SNF.  GI has signed off as well.  Thank you for allowing me to participate in his care.   Good Day.    Discharge Diagnoses:  Principal Problem:   Colonic obstruction (HCC) Active Problems:   Stroke (HCC)   Pulmonary embolism (HCC)   Hypertension   Hemiplegia (HCC)   Hypokalemia   Pressure injury of skin  Discharge Instructions  Discharge Instructions    Diet - low sodium heart healthy    Complete by:  As directed    Discharge instructions    Complete by:  As directed    Follow  up with PCP Next week.  Will need to have Basic metabolic profile check early next week, and Magesium, along with routine INR for Coumadin.  If abdominal distention is found, Dr Renae Fickle recommended insertion of the red rectal tube for decompression.   Increase activity slowly    Complete by:  As directed      Allergies as of 10/30/2016      Reactions   Penicillins    Has patient had a PCN reaction causing immediate rash, facial/tongue/throat swelling, SOB or lightheadedness with hypotension: unknown Has patient had a PCN reaction causing severe rash involving mucus membranes or skin necrosis: unknown Has patient had a PCN reaction that required hospitalization: unknown Has patient had a PCN reaction occurring within the last 10  years: unknown If all of the above answers are "NO", then may proceed with Cephalosporin use. 5/3 Tolerated Cefepime x 1 dose in ED.      Medication List    STOP taking these medications   azithromycin 250 MG tablet Commonly known as:  ZITHROMAX   loratadine 10 MG tablet Commonly known as:  CLARITIN   ROBITUSSIN DM PO   sulfamethoxazole-trimethoprim 200-40 MG/5ML suspension Commonly known as:  BACTRIM,SEPTRA   Lactulose.    TAKE these medications   acetaminophen 325 MG tablet Commonly known as:  TYLENOL Take 650 mg by mouth every 6 (six) hours as needed for moderate pain.   CERAVE Crea Apply 1 application topically daily as needed for itching once a day   ipratropium 0.02 % nebulizer solution Commonly known as:  ATROVENT Take 0.5 mg by nebulization every 6 (six) hours.   labetalol 200 MG tablet Commonly known as:  NORMODYNE Take 200 mg by mouth daily. For HTN     lisinopril 20 MG tablet Commonly known as:  PRINIVIL,ZESTRIL Take 20 mg by mouth daily.   polyethylene glycol packet Commonly known as:  MIRALAX / GLYCOLAX Take 17 g by mouth daily.   polyvinyl alcohol 1.4 % ophthalmic solution Commonly known as:  LIQUIFILM TEARS Place 1 drop into both eyes 2 (two) times daily.   potassium chloride 10 MEQ tablet Commonly known as:  K-DUR Take 1 tablet (10 mEq total) by mouth 3 (three) times daily.   predniSONE 20 MG tablet Commonly known as:  DELTASONE Take 40 mg by mouth daily with breakfast.   sennosides-docusate sodium 8.6-50 MG tablet Commonly known as:  SENOKOT-S Take 1 tablet by mouth at bedtime. For constipation   SYSTANE LID WIPES EX Apply 1 application topically 2 (two) times daily. Start occusoft lid scrubs: clean eyelids of oil and debris BID OU ( each eye ) before administering eye drops.   SYSTANE NIGHTTIME Oint Place 1 application into the left eye at bedtime. Apply to left eye at night   warfarin 2 MG tablet Commonly known as:   COUMADIN Take 1 tablet (2 mg total) by mouth daily at 6 PM. Give 2 mg by mouth once a day       Allergies  Allergen Reactions  . Penicillins     Has patient had a PCN reaction causing immediate rash, facial/tongue/throat swelling, SOB or lightheadedness with hypotension: unknown Has patient had a PCN reaction causing severe rash involving mucus membranes or skin necrosis: unknown Has patient had a PCN reaction that required hospitalization: unknown Has patient had a PCN reaction occurring within the last 10 years: unknown If all of the above answers are "NO", then may proceed with Cephalosporin use. 5/3 Tolerated Cefepime x 1  dose in ED.     Consultations:  GI and Surgery.    Procedures/Studies: Ct Abdomen Pelvis Wo Contrast  Result Date: 10/27/2016 CLINICAL DATA:  Abdominal pain and distention.  Recurrent volvulus. EXAM: CT ABDOMEN AND PELVIS WITHOUT CONTRAST TECHNIQUE: Multidetector CT imaging of the abdomen and pelvis was performed following the standard protocol without IV contrast. COMPARISON:  10/21/2016 FINDINGS: Lower chest: Atelectasis or consolidation in the lung bases, greater on the right. Can't exclude pneumonia. Coronary artery and aortic calcifications. Hepatobiliary: No focal liver abnormality is seen. No gallstones, gallbladder wall thickening, or biliary dilatation. Pancreas: Unremarkable. No pancreatic ductal dilatation or surrounding inflammatory changes. Spleen: Normal in size without focal abnormality. Adrenals/Urinary Tract: Adrenal glands are unremarkable. Kidneys are normal, without renal calculi, focal lesion, or hydronephrosis. Bladder is unremarkable. Stomach/Bowel: Stomach and small bowel are decompressed. There is prominent gaseous distention of the colon with scattered stool throughout the colon. Contrast material was instilled through a rectal tube. There is only a small amount of contrast material in the colon. There is a fairly tight area of narrowing at the  rectosigmoid junction through which the catheter passes. The rectum itself is decompressed. Transition zone does not appear to be acutely twisted at this time and may have been reduced by the rectal catheter. There is suggestion of a stricture at this location with prominent proximal distention similar to previous study. No discrete mass lesion is appreciated. Vascular/Lymphatic: Aortic atherosclerosis. No enlarged abdominal or pelvic lymph nodes. Reproductive: Prostate is unremarkable. Other: No free air or free fluid in the abdomen. Moderate-sized periumbilical hernia containing fat. No bowel herniation. Musculoskeletal: Degenerative changes in the spine. No destructive bone lesions. IMPRESSION: Prominent gaseous distention of the colon to the rectosigmoid junction where there appears to be a focal stricture. No acute twisting at the level of the stricture at this time. A rectal catheter was placed for contrast administration in the catheter passes through the stricture into the dilated colon. The rectum is decompressed. Electronically Signed   By: Burman Nieves M.D.   On: 10/27/2016 06:43   Dg Abd 1 View  Result Date: 10/28/2016 CLINICAL DATA:  Abdominal distension. EXAM: ABDOMEN - 1 VIEW COMPARISON:  10/27/2016 FINDINGS: There is persistent prominent gaseous distension of the sigmoid colon, estimated at 13 cm maximal diameter. The maximal of the sigmoid colon diameter has not significantly changed from yesterday's radiographs, however the descending and more proximal colon now appear nondilated with only a relatively small amount of gas present. Limited assessment for free air on this supine study. No dilated small bowel loops are seen. Bibasilar lung opacities are again partially visualized. Lumbar spondylosis is noted. IMPRESSION: Persistent gaseous distension of the sigmoid colon. Interval resolution of more proximal colonic distention. Electronically Signed   By: Sebastian Ache M.D.   On: 10/28/2016  08:08   Dg Abd 1 View  Result Date: 10/25/2016 CLINICAL DATA:  Status post sigmoid volvulus decompression. EXAM: ABDOMEN - 1 VIEW COMPARISON:  10/23/2016.  Scout image from abdomen CT of 10/31/2016. FINDINGS: Diffuse gaseous colonic distention persists but appears slightly improved in the interval. Degenerative changes noted lumbar spine. Bones are demineralized. Phleboliths are seen over the anatomic pelvis. IMPRESSION: Persistent but slightly decreased diffuse gaseous colonic dilatation. Electronically Signed   By: Kennith Center M.D.   On: 10/25/2016 18:34   Dg Chest Port 1 View  Result Date: 10/21/2016 CLINICAL DATA:  Shortness of breath and fevers EXAM: PORTABLE CHEST 1 VIEW COMPARISON:  03/20/2015 FINDINGS: Cardiac shadow is  mildly enlarged but stable. Overall poor inspiratory effort is noted. Some basilar atelectasis is seen likely related to the poor inspiratory effort. No sizable effusion is seen. No bony abnormality is noted. IMPRESSION: Poor inspiratory effort with basilar atelectasis. Electronically Signed   By: Alcide CleverMark  Lukens M.D.   On: 10/21/2016 09:47   Dg Abdomen Acute W/chest  Result Date: 10/27/2016 CLINICAL DATA:  Abdominal pain and distention.  History of volvulus. EXAM: DG ABDOMEN ACUTE W/ 1V CHEST COMPARISON:  10/25/2016 FINDINGS: Shallow inspiration with linear atelectasis in the lung bases. Heart size and pulmonary vascularity are normal for technique. No pneumothorax. Calcified aorta. Diffuse gaseous distention of the colon is similar to previous study. 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is not excluded. No free intra-abdominal air is demonstrated. No radiopaque stones. Calcified phleboliths in the pelvis. Degenerative changes in the spine. IMPRESSION: Shallow inspiration with atelectasis in the lung bases. Persistent diffuse gaseous distention of the colon similar to previous studies. Electronically Signed   By: Burman NievesWilliam  Stevens M.D.   On: 10/27/2016 04:41   Dg Abd Portable 1v  Result Date:  10/23/2016 CLINICAL DATA:  Followup sigmoid volvulus. EXAM: PORTABLE ABDOMEN - 1 VIEW COMPARISON:  None. FINDINGS: There is continued diffuse abnormal gaseous distension of the large bowel with marked gaseous distension of the sigmoid colon. No free intraperitoneal air identified. IMPRESSION: 1. Persistent abnormal distension of the bowel loops particularly the sigmoid colon. Recurrent sigmoid volvulus would be difficult to exclude based on plain film radiographs and if the patient is clinically obstructed consider repeat CT of the abdomen and pelvis. Electronically Signed   By: Signa Kellaylor  Stroud M.D.   On: 10/23/2016 10:24   Dg Swallowing Func-speech Pathology  Result Date: 10/28/2016 Objective Swallowing Evaluation: Type of Study: MBS-Modified Barium Swallow Study Patient Details Name: [place] T Mickley MRN: [id number] Date of Birth: Jul 06, 1945 Today's Date: 10/28/2016 Time: SLP Start Time (ACUTE ONLY): 1704-SLP Stop Time (ACUTE ONLY): 1736 SLP Time Calculation (min) (ACUTE ONLY): 32 min Past Medical History: Past Medical History: Diagnosis Date . Dysphasia  . Fatty liver  . Hemiplegia (HCC)  . Hypertension  . Pneumonia  . Pulmonary embolism (HCC)  . Stroke [facility](HCC)  Past Surgical History: Past Surgical History: Procedure Laterality Date . FLEXIBLE SIGMOIDOSCOPY N/A 10/21/2016  Procedure: FLEXIBLE SIGMOIDOSCOPY;  Surgeon: Malissa Hippoehman, Najeeb U, MD;  Location: [facility];  Service: Endoscopy;  Laterality: N/A; . unable   HPI: Mickie KayScoville T Dan HumphreysWalker is an 71 y.o. 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  He is unlikely to require surgery, but he did have aspiration yesterday and was made NPO pending speech re evaluation.   Subjective: "Ok" Assessment / Plan / Recommendation CHL IP CLINICAL IMPRESSIONS 10/28/2016 Clinical Impression Pt presents with moderate oral phase dysphagia with  reduced lip closure resulting in labial spillage with liquids, left buccal and lingual weakness resulting in decreased oral bolus cohesiveness with premature spillage of liquids; moderate pharyngeal phase dysphagia characterized by delay in swallow initiation with swallow trigger after filling the valleculae and spilling to pyriforms across consistencies and textures, reduced tongue base retraction, and reduced laryngeal closure resulting in penetration with nectars before and during the swallow as well as gross, silent aspiration of nectars after the swallow (delayed cough eventually elicited). SLP instructed Pt to cough and he was able to produce strong cough, however it is unlikely that he was able to remove  all of aspirated material. Pt with improved safety with honey-thick liquids (no penetration or aspiration) and puree. Swallow trigger was at the level of the pyriforms with mechanical soft textures. Pt with mild vallecular and pyriform residue across consistencies/textures and benefited from repeat/dry swallow after initial bite/sip. Recommend D1/puree with HTL via cup sips. Crush meds as able in puree and no straws. Pt will need 100% supervision with all po intake. Recommend f/u at SNF for diet tolerance and reinforcement of strategies.  SLP Visit Diagnosis Dysphagia, oropharyngeal phase (R13.12) Attention and concentration deficit following -- Frontal lobe and executive function deficit following -- Impact on safety and function Moderate aspiration risk   CHL IP TREATMENT RECOMMENDATION 10/28/2016 Treatment Recommendations Defer treatment plan to f/u with SLP   Prognosis 10/28/2016 Prognosis for Safe Diet Advancement Guarded Barriers to Reach Goals Time post onset;Severity of deficits Barriers/Prognosis Comment -- CHL IP DIET RECOMMENDATION 10/28/2016 SLP Diet Recommendations Dysphagia 1 (Puree) solids;Honey thick liquids Liquid Administration via Cup;No straw Medication Administration Crushed with puree  Compensations Slow rate;Multiple dry swallows after each bite/sip;Clear throat intermittently Postural Changes Remain semi-upright after after feeds/meals (Comment);Seated upright at 90 degrees   CHL IP OTHER RECOMMENDATIONS 10/28/2016 Recommended Consults -- Oral Care Recommendations Oral care BID;Staff/trained caregiver to provide oral care Other Recommendations Order thickener from pharmacy;Prohibited food (jello, ice cream, thin soups);Clarify dietary restrictions   [facility] IP FOLLOW UP RECOMMENDATIONS 10/28/2016 Follow up Recommendations Skilled Nursing facility   [facility] IP FREQUENCY AND DURATION 10/22/2016 Speech Therapy Frequency (ACUTE ONLY) min 2x/week Treatment Duration 2 weeks      CHL IP ORAL PHASE 10/28/2016 Oral Phase Impaired Oral - Pudding Teaspoon -- Oral - Pudding Cup -- Oral - Honey Teaspoon -- Oral - Honey Cup -- Oral - Nectar Teaspoon -- Oral - Nectar Cup Lingual/palatal residue;Piecemeal swallowing;Decreased bolus cohesion;Premature spillage Oral - Nectar Straw -- Oral - Thin Teaspoon -- Oral - Thin Cup -- Oral - Thin Straw -- Oral - Puree -- Oral - Mech Soft Impaired mastication;Piecemeal swallowing;Delayed oral transit;Decreased bolus cohesion Oral - Regular -- Oral - Multi-Consistency -- Oral - Pill -- Oral Phase - Comment --  CHL IP PHARYNGEAL PHASE 10/28/2016 Pharyngeal Phase Impaired Pharyngeal- Pudding Teaspoon -- Pharyngeal -- Pharyngeal- Pudding Cup -- Pharyngeal -- Pharyngeal- Honey Teaspoon -- Pharyngeal -- Pharyngeal- Honey Cup Delayed swallow initiation-vallecula;Pharyngeal residue - valleculae;Reduced tongue base retraction Pharyngeal -- Pharyngeal- Nectar Teaspoon -- Pharyngeal -- Pharyngeal- Nectar Cup Delayed swallow initiation-vallecula;Delayed swallow initiation-pyriform sinuses;Reduced epiglottic inversion;Reduced airway/laryngeal closure;Reduced tongue base retraction;Penetration/Aspiration before swallow;Penetration/Aspiration during swallow;Penetration/Apiration after  swallow;Significant aspiration (Amount);Pharyngeal residue - valleculae;Pharyngeal residue - pyriform Pharyngeal Material enters airway, passes BELOW cords without attempt by patient to eject out (silent aspiration);Material enters airway, passes BELOW cords and not ejected out despite cough attempt by patient;Material enters airway, CONTACTS cords and then ejected out Pharyngeal- Nectar Straw -- Pharyngeal -- Pharyngeal- Thin Teaspoon -- Pharyngeal -- Pharyngeal- Thin Cup -- Pharyngeal -- Pharyngeal- Thin Straw -- Pharyngeal -- Pharyngeal- Puree Delayed swallow initiation-vallecula;Reduced tongue base retraction;Pharyngeal residue - valleculae Pharyngeal -- Pharyngeal- Mechanical Soft Pharyngeal residue - valleculae;Delayed swallow initiation-pyriform sinuses;Reduced epiglottic inversion;Reduced tongue base retraction Pharyngeal -- Pharyngeal- Regular -- Pharyngeal -- Pharyngeal- Multi-consistency -- Pharyngeal -- Pharyngeal- Pill -- Pharyngeal -- Pharyngeal Comment --  CHL IP CERVICAL ESOPHAGEAL PHASE 10/28/2016 Cervical Esophageal Phase WFL Pudding Teaspoon -- Pudding Cup -- Honey Teaspoon -- Honey Cup -- Nectar Teaspoon -- Nectar Cup -- Nectar Straw -- Thin Teaspoon -- Thin Cup -- Thin Straw -- Puree -- Mechanical Soft --  Regular -- Multi-consistency -- Pill -- Cervical Esophageal Comment -- Thank you, Havery Moros, CCC-SLP [phone] No flowsheet data found. PORTER,DABNEY 10/28/2016, 6:55 PM              Ct Renal Stone Study  Result Date: 10/21/2016 CLINICAL DATA:  Abdominal distention and shortness of Breath EXAM: CT ABDOMEN AND PELVIS WITHOUT CONTRAST TECHNIQUE: Multidetector CT imaging of the abdomen and pelvis was performed following the standard protocol without IV contrast. COMPARISON:  12/28/2007 FINDINGS: Lower chest: Bibasilar consolidation is noted right greater than left. Hepatobiliary: With the exception of a small cyst in the right lobe of the liver the liver and gallbladder are within normal  limits. Pancreas: Unremarkable. No pancreatic ductal dilatation or surrounding inflammatory changes. Spleen: Normal in size without focal abnormality. Adrenals/Urinary Tract: The adrenal glands are within normal limits. Renal vascular calcifications are noted. Mild fullness of the left collecting system is seen which may be related to edema from recently passed stone as no definitive stone is identified. The bladder is well distended. Stomach/Bowel: The colon is distended with air and fecal material. There is an area of focal narrowing in the sigmoid colon best seen on image number 54 of series 2 and image 53 of series 7. There are changes consistent with a mobile sigmoid and early sigmoid volvulus. The colon proximal to the area of narrowing in the sigmoid is significantly dilated. On the coronal imaging significant rotation of the sigmoid and its associated vascularity is noted. The appendix is within normal limits. No other bowel abnormality is seen. Vascular/Lymphatic: Aortic atherosclerosis. No enlarged abdominal or pelvic lymph nodes. Reproductive: Prostate is unremarkable. Other: Small fat containing umbilical hernia is noted. Musculoskeletal: Degenerative changes of lumbar spine are seen. No acute bony abnormality is noted. IMPRESSION: Changes consistent with sigmoid volume ileus and proximal colonic obstructive change. The distal most: Beyond the volvulus is decompressed. Surgical consultation is recommended. Mild fullness of the left renal collecting system without definitive stone. These results were called by telephone at the time of interpretation on 10/21/2016 at 11:32 am to Dr. Marily Memos , who verbally acknowledged these results. Electronically Signed   By: Alcide Clever M.D.   On: 10/21/2016 11:33       Subjective:   Discharge Exam: Vitals:   10/30/16 0550 10/30/16 1300  BP: (!) 118/56 130/62  Pulse: 77 81  Resp: 18 20  Temp: 97.4 F (36.3 C) 98 F (36.7 C)   Vitals:   10/30/16  0550 10/30/16 0717 10/30/16 1300 10/30/16 1402  BP: (!) 118/56  130/62   Pulse: 77  81   Resp: 18  20   Temp: 97.4 F (36.3 C)  98 F (36.7 C)   TempSrc: Oral  Oral   SpO2: 97% 97% 99% 97%  Weight:      Height:        General: Pt is alert, awake, not in acute distress Cardiovascular: RRR, S1/S2 +, no rubs, no gallops Respiratory: CTA bilaterally, no wheezing, no rhonchi Abdominal: Soft, NT, ND, bowel sounds + Extremities: no edema, no cyanosis    The results of significant diagnostics from this hospitalization (including imaging, microbiology, ancillary and laboratory) are listed below for reference.     Microbiology: Recent Results (from the past 240 hour(s))  Blood Culture (routine x 2)     Status: None   Collection Time: 10/21/16  9:14 AM  Result Value Ref Range Status   Specimen Description BLOOD RIGHT HAND  Final   Special Requests  Final    BOTTLES DRAWN AEROBIC AND ANAEROBIC Blood Culture adequate volume   Culture NO GROWTH 5 DAYS  Final   Report Status 10/26/2016 FINAL  Final  Blood Culture (routine x 2)     Status: None   Collection Time: 10/21/16  9:27 AM  Result Value Ref Range Status   Specimen Description BLOOD RIGHT FOREARM  Final   Special Requests   Final    BOTTLES DRAWN AEROBIC AND ANAEROBIC Blood Culture adequate volume   Culture NO GROWTH 5 DAYS  Final   Report Status 10/26/2016 FINAL  Final  MRSA PCR Screening     Status: None   Collection Time: 10/21/16  4:43 PM  Result Value Ref Range Status   MRSA by PCR NEGATIVE NEGATIVE Final    Comment:        The GeneXpert MRSA Assay (FDA approved for NASAL specimens only), is one component of a comprehensive MRSA colonization surveillance program. It is not intended to diagnose MRSA infection nor to guide or monitor treatment for MRSA infections.      Labs: BNP (last 3 results)  Recent Labs  10/21/16 0914  BNP 58.0   Basic Metabolic Panel:  Recent Labs Lab 10/27/16 0527  10/28/16 0505 10/29/16 0412 10/30/16 0603 10/30/16 1523  NA 140 144 147* 145  --   K 2.9* 3.1* 2.6* 2.7* 3.5  CL 105 110 115* 114*  --   CO2 27 31 28 27   --   GLUCOSE 110* 109* 96 104*  --   BUN 29* 16 9 12   --   CREATININE 1.33* 0.97 0.89 0.79  --   CALCIUM 8.6* 7.9* 7.9* 7.9*  --   MG  --   --  1.6*  --   --    Liver Function Tests: No results for input(s): AST, ALT, ALKPHOS, BILITOT, PROT, ALBUMIN in the last 168 hours. No results for input(s): LIPASE, AMYLASE in the last 168 hours. No results for input(s): AMMONIA in the last 168 hours. CBC:  Recent Labs Lab 10/27/16 0527 10/28/16 0505 10/29/16 0412 10/30/16 0603  WBC 14.2* 13.2* 9.9 12.3*  NEUTROABS 10.3*  --   --   --   HGB 13.5 11.8* 12.1* 12.1*  HCT 41.7 37.6* 37.4* 37.6*  MCV 91.2 93.8 93.0 92.2  PLT 282 247 263 280   Cardiac Enzymes: No results for input(s): CKTOTAL, CKMB, CKMBINDEX, TROPONINI in the last 168 hours. BNP: Invalid input(s): POCBNP CBG: No results for input(s): GLUCAP in the last 168 hours. D-Dimer No results for input(s): DDIMER in the last 72 hours. Hgb A1c No results for input(s): HGBA1C in the last 72 hours. Lipid Profile No results for input(s): CHOL, HDL, LDLCALC, TRIG, CHOLHDL, LDLDIRECT in the last 72 hours. Thyroid function studies No results for input(s): TSH, T4TOTAL, T3FREE, THYROIDAB in the last 72 hours.  Invalid input(s): FREET3 Anemia work up No results for input(s): VITAMINB12, FOLATE, FERRITIN, TIBC, IRON, RETICCTPCT in the last 72 hours. Urinalysis    Component Value Date/Time   COLORURINE YELLOW 10/20/2016 0230   APPEARANCEUR CLEAR 10/20/2016 0230   LABSPEC 1.025 10/20/2016 0230   PHURINE 5.5 10/20/2016 0230   GLUCOSEU NEGATIVE 10/20/2016 0230   HGBUR SMALL (A) 10/20/2016 0230   BILIRUBINUR NEGATIVE 10/20/2016 0230   KETONESUR TRACE (A) 10/20/2016 0230   PROTEINUR 100 (A) 10/20/2016 0230   NITRITE POSITIVE (A) 10/20/2016 0230   LEUKOCYTESUR NEGATIVE  10/20/2016 0230   Sepsis Labs Invalid input(s): PROCALCITONIN,  WBC,  LACTICIDVEN  Microbiology Recent Results (from the past 240 hour(s))  Blood Culture (routine x 2)     Status: None   Collection Time: 10/21/16  9:14 AM  Result Value Ref Range Status   Specimen Description BLOOD RIGHT HAND  Final   Special Requests   Final    BOTTLES DRAWN AEROBIC AND ANAEROBIC Blood Culture adequate volume   Culture NO GROWTH 5 DAYS  Final   Report Status 10/26/2016 FINAL  Final  Blood Culture (routine x 2)     Status: None   Collection Time: 10/21/16  9:27 AM  Result Value Ref Range Status   Specimen Description BLOOD RIGHT FOREARM  Final   Special Requests   Final    BOTTLES DRAWN AEROBIC AND ANAEROBIC Blood Culture adequate volume   Culture NO GROWTH 5 DAYS  Final   Report Status 10/26/2016 FINAL  Final  MRSA PCR Screening     Status: None   Collection Time: 10/21/16  4:43 PM  Result Value Ref Range Status   MRSA by PCR NEGATIVE NEGATIVE Final    Comment:        The GeneXpert MRSA Assay (FDA approved for NASAL specimens only), is one component of a comprehensive MRSA colonization surveillance program. It is not intended to diagnose MRSA infection nor to guide or monitor treatment for MRSA infections.      Time coordinating discharge: Over 30 minutes  SIGNED:   Houston Siren, MD FACP [facility] 10/30/2016, 4:43 PM   If 7PM-7AM, please contact night-coverage [email or web address] Password [email or web address]

## 2016-10-30 NOTE — Progress Notes (Signed)
CRITICAL VALUE ALERT  Critical value received: Potassium 2.7  Date of notification:10/30/2016  Time of notification:  0738  Critical value read back:Yes  Nurse who received alert:  Karl LukeLisa Calyse Murcia, RN  MD notified (1st page):  Dr.Le  Time of first page:  0740  MD notified (2nd page):  Time of second page:  Responding MD:   Time MD responded:

## 2016-10-31 ENCOUNTER — Inpatient Hospital Stay
Admission: RE | Admit: 2016-10-31 | Discharge: 2016-11-03 | Disposition: A | Discharge status: Other Acute Inpatient Hospital | Payer: Medicare Other | Source: Ambulatory Visit | Attending: Internal Medicine | Admitting: Internal Medicine

## 2016-10-31 DIAGNOSIS — Z880 Allergy status to penicillin: Secondary | ICD-10-CM | POA: Diagnosis not present

## 2016-10-31 DIAGNOSIS — G8191 Hemiplegia, unspecified affecting right dominant side: Secondary | ICD-10-CM | POA: Diagnosis not present

## 2016-10-31 DIAGNOSIS — I2782 Chronic pulmonary embolism: Secondary | ICD-10-CM | POA: Diagnosis not present

## 2016-10-31 DIAGNOSIS — R109 Unspecified abdominal pain: Secondary | ICD-10-CM | POA: Diagnosis not present

## 2016-10-31 DIAGNOSIS — Z86718 Personal history of other venous thrombosis and embolism: Secondary | ICD-10-CM | POA: Diagnosis not present

## 2016-10-31 DIAGNOSIS — G8190 Hemiplegia, unspecified affecting unspecified side: Secondary | ICD-10-CM | POA: Diagnosis present

## 2016-10-31 DIAGNOSIS — I10 Essential (primary) hypertension: Secondary | ICD-10-CM | POA: Diagnosis not present

## 2016-10-31 DIAGNOSIS — Z993 Dependence on wheelchair: Secondary | ICD-10-CM | POA: Diagnosis not present

## 2016-10-31 DIAGNOSIS — Z79899 Other long term (current) drug therapy: Secondary | ICD-10-CM | POA: Diagnosis not present

## 2016-10-31 DIAGNOSIS — J189 Pneumonia, unspecified organism: Secondary | ICD-10-CM | POA: Diagnosis not present

## 2016-10-31 DIAGNOSIS — I639 Cerebral infarction, unspecified: Secondary | ICD-10-CM | POA: Diagnosis not present

## 2016-10-31 DIAGNOSIS — Z8709 Personal history of other diseases of the respiratory system: Secondary | ICD-10-CM | POA: Diagnosis not present

## 2016-10-31 DIAGNOSIS — K8020 Calculus of gallbladder without cholecystitis without obstruction: Secondary | ICD-10-CM | POA: Diagnosis not present

## 2016-10-31 DIAGNOSIS — J441 Chronic obstructive pulmonary disease with (acute) exacerbation: Secondary | ICD-10-CM | POA: Diagnosis not present

## 2016-10-31 DIAGNOSIS — R293 Abnormal posture: Secondary | ICD-10-CM | POA: Diagnosis not present

## 2016-10-31 DIAGNOSIS — J449 Chronic obstructive pulmonary disease, unspecified: Secondary | ICD-10-CM | POA: Diagnosis present

## 2016-10-31 DIAGNOSIS — Z86711 Personal history of pulmonary embolism: Secondary | ICD-10-CM | POA: Diagnosis not present

## 2016-10-31 DIAGNOSIS — R471 Dysarthria and anarthria: Secondary | ICD-10-CM | POA: Diagnosis present

## 2016-10-31 DIAGNOSIS — R140 Abdominal distension (gaseous): Secondary | ICD-10-CM | POA: Diagnosis not present

## 2016-10-31 DIAGNOSIS — E876 Hypokalemia: Secondary | ICD-10-CM | POA: Diagnosis not present

## 2016-10-31 DIAGNOSIS — K56609 Unspecified intestinal obstruction, unspecified as to partial versus complete obstruction: Secondary | ICD-10-CM | POA: Diagnosis not present

## 2016-10-31 DIAGNOSIS — R4702 Dysphasia: Secondary | ICD-10-CM | POA: Diagnosis present

## 2016-10-31 DIAGNOSIS — Z7901 Long term (current) use of anticoagulants: Secondary | ICD-10-CM | POA: Diagnosis not present

## 2016-10-31 DIAGNOSIS — K760 Fatty (change of) liver, not elsewhere classified: Secondary | ICD-10-CM | POA: Diagnosis present

## 2016-10-31 DIAGNOSIS — I69391 Dysphagia following cerebral infarction: Secondary | ICD-10-CM | POA: Diagnosis not present

## 2016-10-31 DIAGNOSIS — K562 Volvulus: Secondary | ICD-10-CM | POA: Diagnosis not present

## 2016-10-31 DIAGNOSIS — Z7952 Long term (current) use of systemic steroids: Secondary | ICD-10-CM | POA: Diagnosis not present

## 2016-10-31 DIAGNOSIS — Q438 Other specified congenital malformations of intestine: Secondary | ICD-10-CM | POA: Diagnosis not present

## 2016-10-31 LAB — PROTIME-INR
INR: 1.49
Prothrombin Time: 18.2 seconds — ABNORMAL HIGH (ref 11.4–15.2)

## 2016-10-31 LAB — CBC
HEMATOCRIT: 38.2 % — AB (ref 39.0–52.0)
HEMOGLOBIN: 12.3 g/dL — AB (ref 13.0–17.0)
MCH: 29.7 pg (ref 26.0–34.0)
MCHC: 32.2 g/dL (ref 30.0–36.0)
MCV: 92.3 fL (ref 78.0–100.0)
Platelets: 277 10*3/uL (ref 150–400)
RBC: 4.14 MIL/uL — ABNORMAL LOW (ref 4.22–5.81)
RDW: 13.7 % (ref 11.5–15.5)
WBC: 11.8 10*3/uL — ABNORMAL HIGH (ref 4.0–10.5)

## 2016-10-31 MED ORDER — WARFARIN SODIUM 2 MG PO TABS: 2.0000 mg | ORAL_TABLET | Freq: Once | ORAL | Status: DC

## 2016-10-31 NOTE — Progress Notes (Signed)
Informed by SW on call for weekend that she [facility] today [facility] Social worker has indicated that she will contact me at approximately 10.00 a.m to update on patient's discharge status. Will continue to follow up on patient's discharge.

## 2016-10-31 NOTE — Clinical Social Work Note (Addendum)
Clinical Social Worker facilitated patient discharge including contacting patient family and facility to confirm patient discharge plans. Clinical information faxed to facility and family agreeable with plan. Pt to be transported  Via tunnel. RN to call report to [phone] prior to discharge.  Clinical Social Worker will sign off for now as social work intervention is no longer needed. Please consult us again if new need arises.  Abbee Cremeens B. Gean QuintBrown,MSW, LCSWA Clinical Social Work Dept Weekend Social Worker [phone] 10:26 AM

## 2016-10-31 NOTE — Clinical Social Work Placement (Signed)
   CLINICAL SOCIAL WORK PLACEMENT  NOTE  Date:  10/31/2016  Patient Details  Name: [name] [name] MRN: [id number] Date of Birth: October 08, 1945  Clinical Social Work is seeking post-discharge placement for this patient at the Skilled  Nursing Facility level of care (*CSW will initial, date and re-position this form in  chart as items are completed):  Yes   Patient/family provided with [facility] Clinical Social Work Department's list of facilities offering this level of care within the geographic area requested by the patient (or if unable, by the patient's family).  Yes   Patient/family informed of their freedom to choose among providers that offer the needed level of care, that participate in Medicare, Medicaid or managed care program needed by the patient, have an available bed and are willing to accept the patient.  Yes   Patient/family informed of [facility] ownership interest [facility] and [facility], as well as of the fact that they are under no obligation to receive care at these facilities.  PASRR submitted to EDS on       PASRR number received on       Existing PASRR number confirmed on 10/31/16     FL2 transmitted to all facilities in geographic area requested by pt/family on       FL2 transmitted to all facilities within larger geographic area on       Patient informed that his/her managed care company has contracts with or will negotiate with certain facilities, including the following:            Patient/family informed of bed offers received.  Patient [facility] bed at  [facility]     Physician recommends and patient chooses bed at      [facility]  [facility] on 10/31/16.  Patient to be transferred to facility by  Sharin Mons(PTAR)     Patient family notified on 10/31/16 of transfer.  Name of family member notified:  Emergency Contact     PHYSICIAN       Additional Comment:     _______________________________________________ Norlene DuelBROWN, Carlethia Mesquita B, LCSWA 10/31/2016, 10:29 AM

## 2016-10-31 NOTE — Progress Notes (Signed)
ANTICOAGULATION CONSULT NOTE   Pharmacy Consult for coumadin Indication: DVT  Allergies  Allergen Reactions  . Penicillins     Has patient had a PCN reaction causing immediate rash, facial/tongue/throat swelling, SOB or lightheadedness with hypotension: unknown Has patient had a PCN reaction causing severe rash involving mucus membranes or skin necrosis: unknown Has patient had a PCN reaction that required hospitalization: unknown Has patient had a PCN reaction occurring within the last 10 years: unknown If all of the above answers are "NO", then may proceed with Cephalosporin use. 5/3 Tolerated Cefepime x 1 dose in ED.     Patient Measurements: Height: 5\' 10"  (177.8 cm) Weight: 205 lb (93 kg) IBW/kg (Calculated) : 73   Vital Signs: Temp: 97.9 F (36.6 C) (05/13 0637) Temp Source: Oral (05/13 0637) BP: 144/79 (05/13 6962) Pulse Rate: 85 (05/13 0637)  Labs:  Recent Labs  10/29/16 0412 10/30/16 0603 10/31/16 0638  HGB 12.1* 12.1* 12.3*  HCT 37.4* 37.6* 38.2*  PLT 263 280 277  LABPROT 32.0* 22.1* 18.2*  INR 3.02 1.90 1.49  CREATININE 0.89 0.79  --     Estimated Creatinine Clearance: 98.4 mL/min (by C-G formula based on SCr of 0.79 mg/dL).   Medical History: Past Medical History:  Diagnosis Date  . Dysphasia   . Fatty liver   . Hemiplegia (HCC)   . Hypertension   . Pneumonia   . Pulmonary embolism (HCC)   . Stroke [facility])     Medications:  Prescriptions Prior to Admission  Medication Sig Dispense Refill Last Dose  . acetaminophen (TYLENOL) 325 MG tablet Take 650 mg by mouth every 6 (six) hours as needed for moderate pain.    10/25/2016 at Unknown time  . Eyelid Cleansers (SYSTANE LID WIPES EX) Apply 1 application topically 2 (two) times daily. Start occusoft lid scrubs: clean eyelids of oil and debris BID OU ( each eye ) before administering eye drops.    10/27/2016 at Unknown time  . ipratropium (ATROVENT) 0.02 % nebulizer solution Take 0.5 mg by nebulization  every 6 (six) hours.   10/27/2016 at 0000  . labetalol (NORMODYNE) 200 MG tablet Take 200 mg by mouth daily. For HTN   10/26/2016 at Unknown time  . lactulose (CHRONULAC) 10 GM/15ML solution Take 10 g by mouth 2 (two) times daily.   10/26/2016 at Unknown time  . lisinopril (PRINIVIL,ZESTRIL) 20 MG tablet Take 20 mg by mouth daily.   10/26/2016 at Unknown time  . loratadine (CLARITIN) 10 MG tablet Take 10 mg by mouth daily.   10/26/2016 at Unknown time  . polyvinyl alcohol (LIQUIFILM TEARS) 1.4 % ophthalmic solution Place 1 drop into both eyes 2 (two) times daily.   10/26/2016 at 0900  . predniSONE (DELTASONE) 20 MG tablet Take 40 mg by mouth daily with breakfast.   10/26/2016 at Unknown time  . sennosides-docusate sodium (SENOKOT-S) 8.6-50 MG tablet Take 1 tablet by mouth at bedtime. For constipation   10/26/2016 at Unknown time  . [EXPIRED] sulfamethoxazole-trimethoprim (BACTRIM,SEPTRA) 200-40 MG/5ML suspension Take 20 mLs by mouth every 12 (twelve) hours. 100 mL 0 10/26/2016 at Unknown time  . warfarin (COUMADIN) 2 MG tablet Take 1 tablet (2 mg total) by mouth daily at 6 PM. Give 2 mg by mouth once a day   10/26/2016 at 1800  . Dextromethorphan-Guaifenesin (ROBITUSSIN DM PO) Take 15 mLs by mouth every 6 (six) hours as needed (cough).    unknown  . Emollient (CERAVE) CREA Apply 1 application topically daily as needed  for itching once a day   unknown  . polyethylene glycol (MIRALAX / GLYCOLAX) packet Take 17 g by mouth daily. 14 each 0 10/24/2016  . White Petrolatum-Mineral Oil (SYSTANE NIGHTTIME) OINT Place 1 application into the left eye at bedtime. Apply to left eye at night    10/21/2016    Assessment: 71 yo man to continue coumadin for h/o PE.   He is very sensitive to coumadin with his GI issues and NPO status. Diet - DYS 1, aspiration cautions. Patient now able to eat, plan to restart Coumadin 2 mg po daily INR 1.49 today after Coumadin 1 mg yesterday.    Goal of Therapy:  INR 2-3 Monitor platelets by  anticoagulation protocol: Yes   Plan:  Coumadin 2 mg po x 1 dose today. Daily PT/INR Monitor for bleeding complications  Raquel Jamesittman, Marnette Perkins Bennett 10/31/2016,8:38 AM

## 2016-10-31 NOTE — Progress Notes (Signed)
Pt's IV catheter removed and intact. Pt's IV site clean dry and intact. Report called and given to Trey PaulaBetty Ash, [facility]. All questions were answered and no further questions at this time. Pt in stable condition and in no acute distress at time of discharge. [facility] escorted to [facility] by nurse tech.

## 2016-11-01 ENCOUNTER — Non-Acute Institutional Stay (SKILLED_NURSING_FACILITY): Payer: Medicare Other | Admitting: Internal Medicine

## 2016-11-01 ENCOUNTER — Telehealth: Payer: Self-pay

## 2016-11-01 ENCOUNTER — Encounter (HOSPITAL_COMMUNITY)
Admission: RE | Admit: 2016-11-01 | Discharge: 2016-11-01 | Disposition: A | Discharge status: Home / Self Care | Payer: Medicare Other | Source: Skilled Nursing Facility | Attending: Internal Medicine | Admitting: Internal Medicine

## 2016-11-01 ENCOUNTER — Encounter (HOSPITAL_COMMUNITY): Payer: Self-pay | Admitting: Internal Medicine

## 2016-11-01 DIAGNOSIS — K562 Volvulus: Secondary | ICD-10-CM | POA: Diagnosis not present

## 2016-11-01 DIAGNOSIS — I2782 Chronic pulmonary embolism: Secondary | ICD-10-CM | POA: Diagnosis not present

## 2016-11-01 DIAGNOSIS — I10 Essential (primary) hypertension: Secondary | ICD-10-CM | POA: Insufficient documentation

## 2016-11-01 DIAGNOSIS — E876 Hypokalemia: Secondary | ICD-10-CM

## 2016-11-01 DIAGNOSIS — J189 Pneumonia, unspecified organism: Secondary | ICD-10-CM | POA: Insufficient documentation

## 2016-11-01 DIAGNOSIS — Z8709 Personal history of other diseases of the respiratory system: Secondary | ICD-10-CM | POA: Diagnosis not present

## 2016-11-01 DIAGNOSIS — Z7901 Long term (current) use of anticoagulants: Secondary | ICD-10-CM | POA: Insufficient documentation

## 2016-11-01 DIAGNOSIS — G8191 Hemiplegia, unspecified affecting right dominant side: Secondary | ICD-10-CM | POA: Insufficient documentation

## 2016-11-01 DIAGNOSIS — I69328 Other speech and language deficits following cerebral infarction: Secondary | ICD-10-CM | POA: Insufficient documentation

## 2016-11-01 DIAGNOSIS — Z993 Dependence on wheelchair: Secondary | ICD-10-CM | POA: Insufficient documentation

## 2016-11-01 LAB — CBC WITH DIFFERENTIAL/PLATELET
BASOS ABS: 0 10*3/uL (ref 0.0–0.1)
Basophils Relative: 0 %
EOS PCT: 1 %
Eosinophils Absolute: 0.1 10*3/uL (ref 0.0–0.7)
HEMATOCRIT: 40.2 % (ref 39.0–52.0)
HEMOGLOBIN: 13.2 g/dL (ref 13.0–17.0)
LYMPHS PCT: 25 %
Lymphs Abs: 2.6 10*3/uL (ref 0.7–4.0)
MCH: 30.1 pg (ref 26.0–34.0)
MCHC: 32.8 g/dL (ref 30.0–36.0)
MCV: 91.6 fL (ref 78.0–100.0)
Monocytes Absolute: 0.6 10*3/uL (ref 0.1–1.0)
Monocytes Relative: 6 %
NEUTROS ABS: 7.1 10*3/uL (ref 1.7–7.7)
NEUTROS PCT: 68 %
PLATELETS: 310 10*3/uL (ref 150–400)
RBC: 4.39 MIL/uL (ref 4.22–5.81)
RDW: 13.7 % (ref 11.5–15.5)
WBC: 10.5 10*3/uL (ref 4.0–10.5)

## 2016-11-01 LAB — BASIC METABOLIC PANEL
Anion gap: 7 (ref 5–15)
BUN: 14 mg/dL (ref 6–20)
CALCIUM: 8.1 mg/dL — AB (ref 8.9–10.3)
CO2: 29 mmol/L (ref 22–32)
CREATININE: 0.85 mg/dL (ref 0.61–1.24)
Chloride: 110 mmol/L (ref 101–111)
GFR calc Af Amer: >60 mL/min (ref >60)
Glucose, Bld: 82 mg/dL (ref 65–99)
Potassium: 2.8 mmol/L — ABNORMAL LOW (ref 3.5–5.1)
SODIUM: 146 mmol/L — AB (ref 135–145)

## 2016-11-01 LAB — PROTIME-INR
INR: 1.31
Prothrombin Time: 16.4 seconds — ABNORMAL HIGH (ref 11.4–15.2)

## 2016-11-01 LAB — MAGNESIUM: MAGNESIUM: 1.8 mg/dL (ref 1.7–2.4)

## 2016-11-01 NOTE — Telephone Encounter (Signed)
This is a patient of [facility], who [facility]. Hospital discharge from [facility] on 10/31/2016

## 2016-11-01 NOTE — Progress Notes (Signed)
Location:   [facility] Room Number: 112/W Place of Service:  SNF [phone]) Provider:  Sabino Dick, MD  [name], [name]  Extended Emergency Contact Information Primary Emergency Contact: [name] Address: [address]          [place], [place] [address] D[place] of [place] Home Phone: [phone] Mobile Phone: [phone] Relation: Brother  Code Status:  Full Code Goals of care: Advanced Directive information Advanced Directives 11/01/2016  Does Patient Have a Medical Advance Directive? Yes  Type of Advance Directive (No Data)  Does patient want to make changes to medical advance directive? No - Patient declined  Copy of Healthcare Power of Attorney in Chart? -  Would patient like information on creating a medical advance directive? No - Patient declined     Chief Complaint  Patient presents with  . Acute Visit  posthospitalization for sigmoid volvulus  HPI:  Pt is a 71 y.o. 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  He is status post decompression with rectal tube.  He is followed by gastroenterology.  And apparently clinical status has improved with successful passing  his stool.  He was seen by surgery and refused any surgical intervention.  Recommendation for nightly decompression with rectal tube when he is back in skilled nursing.  Continue on MiraLAX.  He also had significant hypokalemia this was replaced and will need follow-up. 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  He also has a history of COPD which is been relatively stable as well as hypertension blood pressures apparently were well controlled  in the hospital blood pressures 137/57 today.  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  Continues to be at risk for aspiration.  Currently he has no complaints he still continues to have somewhat of a protuberant abdomen but at this point is not complaining of abdominal pain although he says he does not quite feel like himself.       Past Medical History:  Diagnosis Date  . Dysphasia   . Fatty liver   . Hemiplegia (HCC)   . Hypertension   . Pneumonia   . Pulmonary embolism (HCC)   . Stroke [facility])    Past Surgical History:  Procedure Laterality Date  . FLEXIBLE SIGMOIDOSCOPY N/A 10/21/2016   Procedure: FLEXIBLE SIGMOIDOSCOPY;  Surgeon: Malissa Hippo, MD;  [facility]: [facility];  Service: Endoscopy;  Laterality: N/A;  . FLEXIBLE SIGMOIDOSCOPY N/A 10/25/2016   Procedure: FLEXIBLE SIGMOIDOSCOPY;  Surgeon: Malissa Hippo, MD;  Location: [facility];  Service: Endoscopy;  Laterality: N/A;  . unable      Allergies  Allergen Reactions  . Penicillins     Has patient had a PCN reaction causing immediate rash, facial/tongue/throat swelling, SOB or lightheadedness with hypotension: unknown Has patient had a PCN reaction causing severe rash involving mucus membranes or skin necrosis: unknown Has patient had a PCN reaction that required hospitalization: unknown Has patient had a PCN reaction occurring within the last 10 years: unknown If all of the above answers are "NO", then may proceed with Cephalosporin use. 5/3  Tolerated Cefepime x 1 dose in ED.     Outpatient Encounter Prescriptions as of 11/01/2016  Medication Sig  . acetaminophen (TYLENOL) 325 MG tablet Take 650 mg by mouth every 6 (six) hours as needed for moderate pain.   Marland Kitchen betamethasone dipropionate (DIPROLENE) 0.05 % cream Apply to legs and hands. Do not apply to face groin, axilla. Try Cerave first twice a day.  . Emollient (CERAVE) CREA Apply 1  application topically daily as needed for itching once a day  . Eyelid Cleansers (SYSTANE LID WIPES EX) Apply 1 application topically 2 (two) times daily. Start occusoft lid scrubs: clean eyelids of oil and debris BID OU ( each eye ) before administering eye drops.   Marland Kitchen ipratropium (ATROVENT) 0.02 % nebulizer solution Take 0.5 mg by nebulization every 6 (six) hours.  Marland Kitchen labetalol (NORMODYNE) 200 MG tablet Take 200 mg by mouth daily. For HTN  . lisinopril (PRINIVIL,ZESTRIL) 20 MG tablet Take 20 mg by mouth daily.  . polyvinyl alcohol (LIQUIFILM TEARS) 1.4 % ophthalmic solution Place 1 drop into both eyes 2 (two) times daily.  . potassium chloride (K-DUR) 10 MEQ tablet Take 1 tablet (10 mEq total) by mouth 3 (three) times daily.  . predniSONE (DELTASONE) 20 MG tablet Take 40 mg by mouth daily with breakfast.  . sennosides-docusate sodium (SENOKOT-S) 8.6-50 MG tablet Take 1 tablet by mouth at bedtime. For constipation  . warfarin (COUMADIN) 3 MG tablet Take 3 mg by mouth daily.  Cliffton Asters Petrolatum-Mineral Oil (SYSTANE NIGHTTIME) OINT Place 1 application into the left eye at bedtime. Apply to left eye at night   . [DISCONTINUED] polyethylene glycol (MIRALAX / GLYCOLAX) packet Take 17 g by mouth daily.  . [DISCONTINUED] warfarin (COUMADIN) 2 MG tablet Take 1 tablet (2 mg total) by mouth daily at 6 PM. Give 2 mg by mouth once a day   No facility-administered encounter medications on file as of 11/01/2016.     Review of Systems  General no complaints of fever or chills.Says he just does not feel quite like himself  Skin does not complaining of any increased bruising bleeding or rashes. Has a small open area on his right heel   Eyes does not complain of visual changes from baseline.  Resp--does not complain of shortness breath or greatly increased cough-still has congestion-appears relatively baseline-  Cardiac no chest pain has mild lower extremity edema which is baseline.  GI E history as  noted above complains of some mild abdominal discomfort does not describe this as acute apparently he has had bowel movements-does not complain of nausea or vomiting  Muscle skeletal does not complain of joint pain .  Neurologic continues with right-sided weakness which is baseline upper and lower extremities with some contractures of his right hand-does not complain of headache dizziness or syncopal-type feelings.  Psych continues to be in good spirits pleasant does not complain of depression or anxiety    Immunization History  Administered Date(s) Administered  . Influenza-Unspecified 03/28/2014, 03/23/2016  . Pneumococcal-Unspecified 11/01/2009, 03/30/2016   Pertinent  Health Maintenance Due  Topic Date Due  . COLONOSCOPY  10/30/2025 (Originally 11/08/1995)  . INFLUENZA VACCINE  01/19/2017  . PNA vac Low Risk Adult (2 of 2 - PCV13) 03/30/2017   No flowsheet data found. Functional Status Survey:    Vitals:   11/01/16 1453  BP: (!) 137/57  Pulse: 77  Resp: (!) 22  Temp: 98.3 F (36.8 C)  TempSrc: Oral  SpO2: 98%    Physical  Exam   In general this is a pleasant elderly male in no distress. But appears weaker than I seen him previously  His skin is warm and dry.-She does have a small open area on his right heel with an erythematous wound bed I do not really see any drainage surrounding erythema odor or sign of infection  I do not see any evidence of increased bruising or bleeding.  Oropharynx clear mucous membranes moist.  Eyes continues to have opacity of left eye with history of palsyright eye acuity appears grossly intact  Chest  Somewhat shallow air entry w With continued congestion some rales/rhonci--no labored breathing   Heart is regular rate and rhythm without murmur gallop or rub has mild baseline lower extremity edema     Abdomen soft nontender protuberant positive bowel sounds--- tympanic sounding in all 4 fields  Muscle  skeletal-continues with right-sided weakness contracture of his right hand to some extentbut is able to move right upper and lower extremities-this is baseline---strength appears preserved left upper and lower extremities is ambulatory inwheelchair.  He is back ambulating in the hallway today in his wheelchair  Neurologic is grossly intact continues with right-sided weakness which is not new. With some dysarthric speech which is baseline  Psych he is alert and oriented pleasant and appropriate  Labs reviewed:  Recent Labs  10/29/16 0412 10/30/16 0603 10/30/16 1523 11/01/16 0700  NA 147* 145  --  146*  K 2.6* 2.7* 3.5 2.8*  CL 115* 114*  --  110  CO2 28 27  --  29  GLUCOSE 96 104*  --  82  BUN 9 12  --  14  CREATININE 0.89 0.79  --  0.85  CALCIUM 7.9* 7.9*  --  8.1*  MG 1.6*  --   --  1.8    Recent Labs  07/28/16 0700 10/19/16 1150 10/21/16 0914  AST 23 29 38  ALT 29 39 45  ALKPHOS 59 74 59  BILITOT 0.4 0.7 0.7  PROT 7.0 9.0* 8.2*  ALBUMIN 3.3* 4.0 3.5    Recent Labs  10/21/16 0914  10/27/16 0527  10/30/16 0603 10/31/16 0638 11/01/16 0700  WBC 16.4*  < > 14.2*  < > 12.3* 11.8* 10.5  NEUTROABS 13.0*  --  10.3*  --   --   --  7.1  HGB 15.4  < > 13.5  < > 12.1* 12.3* 13.2  HCT 47.0  < > 41.7  < > 37.6* 38.2* 40.2  MCV 92.5  < > 91.2  < > 92.2 92.3 91.6  PLT 347  < > 282  < > 280 277 310  < > = values in this interval not displayed. Lab Results  Component Value Date   TSH <0.010 (L) 10/27/2016   Lab Results  Component Value Date   HGBA1C  03/27/2009    6.0 (NOTE) The ADA recommends the following therapeutic goal for glycemic control related to Hgb A1c measurement: Goal of therapy: <6.5 Hgb A1c  Reference: American Diabetes Association: Clinical Practice Recommendations 2010, Diabetes Care, 2010, 33: (Suppl  1).   Lab Results  Component Value Date   CHOL 117 07/28/2016   HDL 33 (L) 07/28/2016   LDLCALC 75 07/28/2016   TRIG 45 07/28/2016    CHOLHDL 3.5 07/28/2016    Significant Diagnostic Results in last 30 days:  Ct Abdomen Pelvis Wo Contrast  Result Date: 10/27/2016 CLINICAL DATA:  Abdominal pain and distention.  Recurrent volvulus. EXAM: CT ABDOMEN AND PELVIS WITHOUT  CONTRAST TECHNIQUE: Multidetector CT imaging of the abdomen and pelvis was performed following the standard protocol without IV contrast. COMPARISON:  10/21/2016 FINDINGS: Lower chest: Atelectasis or consolidation in the lung bases, greater on the right. Can't exclude pneumonia. Coronary artery and aortic calcifications. Hepatobiliary: No focal liver abnormality is seen. No gallstones, gallbladder wall thickening, or biliary dilatation. Pancreas: Unremarkable. No pancreatic ductal dilatation or surrounding inflammatory changes. Spleen: Normal in size without focal abnormality. Adrenals/Urinary Tract: Adrenal glands are unremarkable. Kidneys are normal, without renal calculi, focal lesion, or hydronephrosis. Bladder is unremarkable. Stomach/Bowel: Stomach and small bowel are decompressed. There is prominent gaseous distention of the colon with scattered stool throughout the colon. Contrast material was instilled through a rectal tube. There is only a small amount of contrast material in the colon. There is a fairly tight area of narrowing at the rectosigmoid junction through which the catheter passes. The rectum itself is decompressed. Transition zone does not appear to be acutely twisted at this time and may have been reduced by the rectal catheter. There is suggestion of a stricture at this location with prominent proximal distention similar to previous study. No discrete mass lesion is appreciated. Vascular/Lymphatic: Aortic atherosclerosis. No enlarged abdominal or pelvic lymph nodes. Reproductive: Prostate is unremarkable. Other: No free air or free fluid in the abdomen. Moderate-sized periumbilical hernia containing fat. No bowel herniation. Musculoskeletal: Degenerative  changes in the spine. No destructive bone lesions. IMPRESSION: Prominent gaseous distention of the colon to the rectosigmoid junction where there appears to be a focal stricture. No acute twisting at the level of the stricture at this time. A rectal catheter was placed for contrast administration in the catheter passes through the stricture into the dilated colon. The rectum is decompressed. Electronically Signed   By: Burman Nieves M.D.   On: 10/27/2016 06:43   Dg Abd 1 View  Result Date: 10/28/2016 CLINICAL DATA:  Abdominal distension. EXAM: ABDOMEN - 1 VIEW COMPARISON:  10/27/2016 FINDINGS: There is persistent prominent gaseous distension of the sigmoid colon, estimated at 13 cm maximal diameter. The maximal of the sigmoid colon diameter has not significantly changed from yesterday's radiographs, however the descending and more proximal colon now appear nondilated with only a relatively small amount of gas present. Limited assessment for free air on this supine study. No dilated small bowel loops are seen. Bibasilar lung opacities are again partially visualized. Lumbar spondylosis is noted. IMPRESSION: Persistent gaseous distension of the sigmoid colon. Interval resolution of more proximal colonic distention. Electronically Signed   By: Sebastian Ache M.D.   On: 10/28/2016 08:08   Dg Abd 1 View  Result Date: 10/25/2016 CLINICAL DATA:  Status post sigmoid volvulus decompression. EXAM: ABDOMEN - 1 VIEW COMPARISON:  10/23/2016.  Scout image from abdomen CT of 10/31/2016. FINDINGS: Diffuse gaseous colonic distention persists but appears slightly improved in the interval. Degenerative changes noted lumbar spine. Bones are demineralized. Phleboliths are seen over the anatomic pelvis. IMPRESSION: Persistent but slightly decreased diffuse gaseous colonic dilatation. Electronically Signed   By: Kennith Center M.D.   On: 10/25/2016 18:34   Dg Chest Port 1 View  Result Date: 10/21/2016 CLINICAL DATA:  Shortness of  breath and fevers EXAM: PORTABLE CHEST 1 VIEW COMPARISON:  03/20/2015 FINDINGS: Cardiac shadow is mildly enlarged but stable. Overall poor inspiratory effort is noted. Some basilar atelectasis is seen likely related to the poor inspiratory effort. No sizable effusion is seen. No bony abnormality is noted. IMPRESSION: Poor inspiratory effort with basilar atelectasis. Electronically Signed  By: Alcide CleverMark  Lukens M.D.   On: 10/21/2016 09:47   Dg Abdomen Acute W/chest  Result Date: 10/27/2016 CLINICAL DATA:  Abdominal pain and distention.  History of volvulus. EXAM: DG ABDOMEN ACUTE W/ 1V CHEST COMPARISON:  10/25/2016 FINDINGS: Shallow inspiration with linear atelectasis in the lung bases. Heart size and pulmonary vascularity are normal for technique. No pneumothorax. Calcified aorta. Diffuse gaseous distention of the colon is similar to previous study. 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is not excluded. No free intra-abdominal air is demonstrated. No radiopaque stones. Calcified phleboliths in the pelvis. Degenerative changes in the spine. IMPRESSION: Shallow inspiration with atelectasis in the lung bases. Persistent diffuse gaseous distention of the colon similar to previous studies. Electronically Signed   By: Burman NievesWilliam  Stevens M.D.   On: 10/27/2016 04:41   Dg Abd Portable 1v  Result Date: 10/23/2016 CLINICAL DATA:  Followup sigmoid volvulus. EXAM: PORTABLE ABDOMEN - 1 VIEW COMPARISON:  None. FINDINGS: There is continued diffuse abnormal gaseous distension of the large bowel with marked gaseous distension of the sigmoid colon. No free intraperitoneal air identified. IMPRESSION: 1. Persistent abnormal distension of the bowel loops particularly the sigmoid colon. Recurrent sigmoid volvulus would be difficult to exclude based on plain film radiographs and if the patient is clinically obstructed consider repeat CT of the abdomen and pelvis. Electronically Signed   By: Signa Kellaylor  Stroud M.D.   On: 10/23/2016 10:24   Dg Swallowing  Func-speech Pathology  Result Date: 10/28/2016 Objective Swallowing Evaluation: Type of Study: MBS-Modified Barium Swallow Study Patient Details Name: [place] DesanctisScoville T Behunin MRN: [id number] Date of Birth: [birth year] Today's Date: 10/28/2016 Time: SLP Start Time (ACUTE ONLY): 1704-SLP Stop Time (ACUTE ONLY): 1736 SLP Time Calculation (min) (ACUTE ONLY): 32 min Past Medical History: Past Medical History: Diagnosis Date . Dysphasia  . Fatty liver  . Hemiplegia (HCC)  . Hypertension  . Pneumonia  . Pulmonary embolism (HCC)  . Stroke [facility](HCC)  Past Surgical History: Past Surgical History: Procedure Laterality Date . FLEXIBLE SIGMOIDOSCOPY N/A 10/21/2016  Procedure: FLEXIBLE SIGMOIDOSCOPY;  Surgeon: Malissa Hippoehman, Najeeb U, MD;  [facility];  Service: Endoscopy;  Laterality: N/A; . unable   HPI: Mickie KayScoville T Dan HumphreysWalker is an 71 y.o. 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  He is unlikely to require surgery, but he did have aspiration yesterday and was made NPO pending speech re evaluation.   Subjective: "Ok" Assessment / Plan / Recommendation CHL IP CLINICAL IMPRESSIONS 10/28/2016 Clinical Impression Pt presents with moderate oral phase dysphagia with reduced lip closure resulting in labial spillage with liquids, left buccal and lingual weakness resulting in decreased oral bolus cohesiveness with premature spillage of liquids; moderate pharyngeal phase dysphagia characterized by delay in swallow initiation with swallow trigger after filling the valleculae and spilling to pyriforms across consistencies and textures, reduced tongue base retraction, and reduced laryngeal closure resulting in penetration with nectars before and during the swallow as well as gross, silent aspiration of nectars after the swallow (delayed cough eventually elicited). SLP instructed Pt to cough and he was able  to produce strong cough, however it is unlikely that he was able to remove all of aspirated material. Pt with improved safety with honey-thick liquids (no penetration or aspiration) and puree. Swallow trigger was at the level of the pyriforms with mechanical soft textures. Pt with mild vallecular and pyriform residue across consistencies/textures and benefited from repeat/dry  swallow after initial bite/sip. Recommend D1/puree with HTL via cup sips. Crush meds as able in puree and no straws. Pt will need 100% supervision with all po intake. Recommend f/u at SNF for diet tolerance and reinforcement of strategies.  SLP Visit Diagnosis Dysphagia, oropharyngeal phase (R13.12) Attention and concentration deficit following -- Frontal lobe and executive function deficit following -- Impact on safety and function Moderate aspiration risk   CHL IP TREATMENT RECOMMENDATION 10/28/2016 Treatment Recommendations Defer treatment plan to f/u with SLP   Prognosis 10/28/2016 Prognosis for Safe Diet Advancement Guarded Barriers to Reach Goals Time post onset;Severity of deficits Barriers/Prognosis Comment -- CHL IP DIET RECOMMENDATION 10/28/2016 SLP Diet Recommendations Dysphagia 1 (Puree) solids;Honey thick liquids Liquid Administration via Cup;No straw Medication Administration Crushed with puree Compensations Slow rate;Multiple dry swallows after each bite/sip;Clear throat intermittently Postural Changes Remain semi-upright after after feeds/meals (Comment);Seated upright at 90 degrees   CHL IP OTHER RECOMMENDATIONS 10/28/2016 Recommended Consults -- Oral Care Recommendations Oral care BID;Staff/trained caregiver to provide oral care Other Recommendations Order thickener from pharmacy;Prohibited food (jello, ice cream, thin soups);Clarify dietary restrictions   CHL IP FOLLOW UP RECOMMENDATIONS 10/28/2016 Follow up Recommendations Skilled Nursing facility   [facility] IP FREQUENCY AND DURATION 10/22/2016 Speech Therapy Frequency (ACUTE ONLY) min  2x/week Treatment Duration 2 weeks      CHL IP ORAL PHASE 10/28/2016 Oral Phase Impaired Oral - Pudding Teaspoon -- Oral - Pudding Cup -- Oral - Honey Teaspoon -- Oral - Honey Cup -- Oral - Nectar Teaspoon -- Oral - Nectar Cup Lingual/palatal residue;Piecemeal swallowing;Decreased bolus cohesion;Premature spillage Oral - Nectar Straw -- Oral - Thin Teaspoon -- Oral - Thin Cup -- Oral - Thin Straw -- Oral - Puree -- Oral - Mech Soft Impaired mastication;Piecemeal swallowing;Delayed oral transit;Decreased bolus cohesion Oral - Regular -- Oral - Multi-Consistency -- Oral - Pill -- Oral Phase - Comment --  CHL IP PHARYNGEAL PHASE 10/28/2016 Pharyngeal Phase Impaired Pharyngeal- Pudding Teaspoon -- Pharyngeal -- Pharyngeal- Pudding Cup -- Pharyngeal -- Pharyngeal- Honey Teaspoon -- Pharyngeal -- Pharyngeal- Honey Cup Delayed swallow initiation-vallecula;Pharyngeal residue - valleculae;Reduced tongue base retraction Pharyngeal -- Pharyngeal- Nectar Teaspoon -- Pharyngeal -- Pharyngeal- Nectar Cup Delayed swallow initiation-vallecula;Delayed swallow initiation-pyriform sinuses;Reduced epiglottic inversion;Reduced airway/laryngeal closure;Reduced tongue base retraction;Penetration/Aspiration before swallow;Penetration/Aspiration during swallow;Penetration/Apiration after swallow;Significant aspiration (Amount);Pharyngeal residue - valleculae;Pharyngeal residue - pyriform Pharyngeal Material enters airway, passes BELOW cords without attempt by patient to eject out (silent aspiration);Material enters airway, passes BELOW cords and not ejected out despite cough attempt by patient;Material enters airway, CONTACTS cords and then ejected out Pharyngeal- Nectar Straw -- Pharyngeal -- Pharyngeal- Thin Teaspoon -- Pharyngeal -- Pharyngeal- Thin Cup -- Pharyngeal -- Pharyngeal- Thin Straw -- Pharyngeal -- Pharyngeal- Puree Delayed swallow initiation-vallecula;Reduced tongue base retraction;Pharyngeal residue - valleculae Pharyngeal --  Pharyngeal- Mechanical Soft Pharyngeal residue - valleculae;Delayed swallow initiation-pyriform sinuses;Reduced epiglottic inversion;Reduced tongue base retraction Pharyngeal -- Pharyngeal- Regular -- Pharyngeal -- Pharyngeal- Multi-consistency -- Pharyngeal -- Pharyngeal- Pill -- Pharyngeal -- Pharyngeal Comment --  CHL IP CERVICAL ESOPHAGEAL PHASE 10/28/2016 Cervical Esophageal Phase WFL Pudding Teaspoon -- Pudding Cup -- Honey Teaspoon -- Honey Cup -- Nectar Teaspoon -- Nectar Cup -- Nectar Straw -- Thin Teaspoon -- Thin Cup -- Thin Straw -- Puree -- Mechanical Soft -- Regular -- Multi-consistency -- Pill -- Cervical Esophageal Comment -- Thank you, Havery Moros, CCC-SLP [phone] No flowsheet data found. PORTER,DABNEY 10/28/2016, 6:55 PM              Ct Renal Stone Study  Result  Date: 10/21/2016 CLINICAL DATA:  Abdominal distention and shortness of Breath EXAM: CT ABDOMEN AND PELVIS WITHOUT CONTRAST TECHNIQUE: Multidetector CT imaging of the abdomen and pelvis was performed following the standard protocol without IV contrast. COMPARISON:  12/28/2007 FINDINGS: Lower chest: Bibasilar consolidation is noted right greater than left. Hepatobiliary: With the exception of a small cyst in the right lobe of the liver the liver and gallbladder are within normal limits. Pancreas: Unremarkable. No pancreatic ductal dilatation or surrounding inflammatory changes. Spleen: Normal in size without focal abnormality. Adrenals/Urinary Tract: The adrenal glands are within normal limits. Renal vascular calcifications are noted. Mild fullness of the left collecting system is seen which may be related to edema from recently passed stone as no definitive stone is identified. The bladder is well distended. Stomach/Bowel: The colon is distended with air and fecal material. There is an area of focal narrowing in the sigmoid colon best seen on image number 54 of series 2 and image 53 of series 7. There are changes consistent with a  mobile sigmoid and early sigmoid volvulus. The colon proximal to the area of narrowing in the sigmoid is significantly dilated. On the coronal imaging significant rotation of the sigmoid and its associated vascularity is noted. The appendix is within normal limits. No other bowel abnormality is seen. Vascular/Lymphatic: Aortic atherosclerosis. No enlarged abdominal or pelvic lymph nodes. Reproductive: Prostate is unremarkable. Other: Small fat containing umbilical hernia is noted. Musculoskeletal: Degenerative changes of lumbar spine are seen. No acute bony abnormality is noted. IMPRESSION: Changes consistent with sigmoid volume ileus and proximal colonic obstructive change. The distal most: Beyond the volvulus is decompressed. Surgical consultation is recommended. Mild fullness of the left renal collecting system without definitive stone. These results were called by telephone at the time of interpretation on 10/21/2016 at 11:32 am to Dr. Marily Memos , who verbally acknowledged these results. Electronically Signed   By: Alcide Clever M.D.   On: 10/21/2016 11:33    Assessment/Plan  #1 history of sigmoid volvulus--again there is for rectal tube as noted above-he is also on Senokot daily at bedtime with recommendation for low dose MiraLAX as well at this point will monitor it continues to be quite fragile in this regards.  #2 history of hypokalemia potassium is low at 2.8 today Will supplement with 40 mEq twice a day this will need to be rechecked tomorrow.  #3 history of hypertension this appears relatively stable on lisinopril as well as labetalol.  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  #5 history of COPD-bronchitis continues on Atrovent every 6 hours as well as prednisone this appears to be  relatively baseline.  [id number] note greater than 40 minutes spent assessing patient-discussing his status with nursing staff-reviewing his chart-reviewing his labs-and coordinating and formulating a plan of care-of note greater than 50% of time spent coordinating plan of care

## 2016-11-02 ENCOUNTER — Encounter: Payer: Self-pay | Admitting: Internal Medicine

## 2016-11-02 ENCOUNTER — Encounter (HOSPITAL_COMMUNITY)
Admission: RE | Admit: 2016-11-02 | Discharge: 2016-11-02 | Disposition: A | Discharge status: Home / Self Care | Payer: Medicare Other | Source: Skilled Nursing Facility | Attending: Internal Medicine | Admitting: Internal Medicine

## 2016-11-02 DIAGNOSIS — G8191 Hemiplegia, unspecified affecting right dominant side: Secondary | ICD-10-CM | POA: Insufficient documentation

## 2016-11-02 DIAGNOSIS — Z993 Dependence on wheelchair: Secondary | ICD-10-CM | POA: Insufficient documentation

## 2016-11-02 DIAGNOSIS — I69328 Other speech and language deficits following cerebral infarction: Secondary | ICD-10-CM | POA: Insufficient documentation

## 2016-11-02 DIAGNOSIS — J189 Pneumonia, unspecified organism: Secondary | ICD-10-CM | POA: Insufficient documentation

## 2016-11-02 DIAGNOSIS — I10 Essential (primary) hypertension: Secondary | ICD-10-CM | POA: Insufficient documentation

## 2016-11-02 DIAGNOSIS — E875 Hyperkalemia: Secondary | ICD-10-CM | POA: Insufficient documentation

## 2016-11-02 LAB — PROTIME-INR
INR: 1.23
PROTHROMBIN TIME: 15.6 s — AB (ref 11.4–15.2)

## 2016-11-02 LAB — BASIC METABOLIC PANEL
Anion gap: 6 (ref 5–15)
BUN: 12 mg/dL (ref 6–20)
CO2: 30 mmol/L (ref 22–32)
CREATININE: 0.86 mg/dL (ref 0.61–1.24)
Calcium: 8.4 mg/dL — ABNORMAL LOW (ref 8.9–10.3)
Chloride: 111 mmol/L (ref 101–111)
GFR calc Af Amer: >60 mL/min (ref >60)
Glucose, Bld: 101 mg/dL — ABNORMAL HIGH (ref 65–99)
Potassium: 3.3 mmol/L — ABNORMAL LOW (ref 3.5–5.1)
Sodium: 147 mmol/L — ABNORMAL HIGH (ref 135–145)

## 2016-11-02 NOTE — Progress Notes (Signed)
Provider:  Einar CrowAnjali,Jakala Herford Location:   [facility] Room Number: 112/W Place of Service:  SNF (31)  PCP: Mahlon GammonGupta, Shyquan Stallbaumer L, MD Patient Care Team: Mahlon GammonGupta, Omar Orrego L, [name]  Extended Emergency Contact Information Primary Emergency Contact: Venetia MaxonWalker,[name] Jr Address: [address]          [place], [place] [address] [place] Home Phone: [phone] Mobile Phone: [phone] Relation: Brother  Code Status: Full Code Goals of Care: Advanced Directive information Advanced Directives 11/02/2016  Does Patient Have a Medical Advance Directive? Yes  Type of Advance Directive (No Data)  Does patient want to make changes to medical advance directive? No - Patient declined  Copy of Healthcare Power of Attorney in Chart? -  Would patient like information on creating a medical advance directive? No - Patient declined      Chief Complaint  Patient presents with  . Readmit To SNF    HPI: Patient is a 71 y.o. male seen today for admission to  Past Medical History:  Diagnosis Date  . Dysphasia   . Fatty liver   . Hemiplegia (HCC)   . Hypertension   . Pneumonia   . Pulmonary embolism (HCC)   . Stroke [facility](HCC)    Past Surgical History:  Procedure Laterality Date  . FLEXIBLE SIGMOIDOSCOPY N/A 10/21/2016   Procedure: FLEXIBLE SIGMOIDOSCOPY;  Surgeon: Malissa Hippoehman, Najeeb U, MD;  Location: [facility];  Service: Endoscopy;  Laterality: N/A;  . FLEXIBLE SIGMOIDOSCOPY N/A 10/25/2016   Procedure: FLEXIBLE SIGMOIDOSCOPY;  Surgeon: Malissa Hippoehman, Najeeb U, MD;  Location: [facility];  Service: Endoscopy;  Laterality: N/A;  . unable      reports that he has never smoked. He has never used smokeless tobacco. He reports that he does not drink alcohol or use drugs. Social History   Social History  . Marital status: Single    Spouse name: N/A  . Number of children: N/A  . Years of education: N/A   Occupational History  . Not on file.   Social  History Main Topics  . Smoking status: Never Smoker  . Smokeless tobacco: Never Used  . Alcohol use No  . Drug use: No  . Sexual activity: Not on file   Other Topics Concern  . Not on file   Social History Narrative  . No narrative on file    Functional Status Survey:    History reviewed. No pertinent family history.  Health Maintenance  Topic Date Due  . COLONOSCOPY  10/30/2025 (Originally 11/08/1995)  . TETANUS/TDAP  10/30/2025 (Originally [birth year]/1964)  . INFLUENZA VACCINE  01/19/2017  . PNA vac Low Risk Adult (2 of 2 - PCV13) 03/30/2017  . Hepatitis C Screening  Completed    Allergies  Allergen Reactions  . Penicillins     Has patient had a PCN reaction causing immediate rash, facial/tongue/throat swelling, SOB or lightheadedness with hypotension: unknown Has patient had a PCN reaction causing severe rash involving mucus membranes or skin necrosis: unknown Has patient had a PCN reaction that required hospitalization: unknown Has patient had a PCN reaction occurring within the last 10 years: unknown If all of the above answers are "NO", then may proceed with Cephalosporin use. 5/3 Tolerated Cefepime x 1 dose in ED.     Outpatient Encounter Prescriptions as of 11/02/2016  Medication Sig  . acetaminophen (TYLENOL) 325 MG tablet Take 650 mg by mouth every 6 (six) hours as needed for moderate pain.   .Marland Kitchen  betamethasone dipropionate (DIPROLENE) 0.05 % cream Apply to legs and hands. Do not apply to face groin, axilla. Try Cerave first twice a day.  . Emollient (CERAVE) CREA Apply 1 application topically daily as needed for itching once a day  . Eyelid Cleansers (SYSTANE LID WIPES EX) Apply 1 application topically 2 (two) times daily. Start occusoft lid scrubs: clean eyelids of oil and debris BID OU ( each eye ) before administering eye drops.   Marland Kitchen ipratropium (ATROVENT) 0.02 % nebulizer solution Take 0.5 mg by nebulization every 6 (six) hours.  Marland Kitchen labetalol (NORMODYNE) 200 MG tablet  Take 200 mg by mouth daily. For HTN  . lisinopril (PRINIVIL,ZESTRIL) 20 MG tablet Take 20 mg by mouth daily.  . polyethylene glycol (MIRALAX / GLYCOLAX) packet Take 17 g by mouth at bedtime.  . polyvinyl alcohol (LIQUIFILM TEARS) 1.4 % ophthalmic solution Place 1 drop into both eyes 2 (two) times daily.  . potassium chloride (K-DUR) 10 MEQ tablet Take 1 tablet (10 mEq total) by mouth 3 (three) times daily.  . predniSONE (DELTASONE) 20 MG tablet Take 40 mg by mouth daily with breakfast.  . sennosides-docusate sodium (SENOKOT-S) 8.6-50 MG tablet Take 1 tablet by mouth at bedtime. For constipation  . warfarin (COUMADIN) 3 MG tablet Take 3 mg by mouth daily.  Cliffton Asters Petrolatum-Mineral Oil (SYSTANE NIGHTTIME) OINT Place 1 application into the left eye at bedtime. Apply to left eye at night    No facility-administered encounter medications on file as of 11/02/2016.      Review of Systems  Vitals:   11/02/16 0929  BP: (!) 178/84  Pulse: 80  Resp: (!) 22  Temp: 97.8 F (36.6 C)  TempSrc: Oral  SpO2: 93%   There is no height or weight on file to calculate BMI. Physical Exam  Labs reviewed: Basic Metabolic Panel:  Recent Labs  09/81/19 0412 10/30/16 0603 10/30/16 1523 11/01/16 0700 11/02/16 0730  NA 147* 145  --  146* 147*  K 2.6* 2.7* 3.5 2.8* 3.3*  CL 115* 114*  --  110 111  CO2 28 27  --  29 30  GLUCOSE 96 104*  --  82 101*  BUN 9 12  --  14 12  CREATININE 0.89 0.79  --  0.85 0.86  CALCIUM 7.9* 7.9*  --  8.1* 8.4*  MG 1.6*  --   --  1.8  --    Liver Function Tests:  Recent Labs  07/28/16 0700 10/19/16 1150 10/21/16 0914  AST 23 29 38  ALT 29 39 45  ALKPHOS 59 74 59  BILITOT 0.4 0.7 0.7  PROT 7.0 9.0* 8.2*  ALBUMIN 3.3* 4.0 3.5   No results for input(s): LIPASE, AMYLASE in the last 8760 hours. No results for input(s): AMMONIA in the last 8760 hours. CBC:  Recent Labs  10/21/16 0914  10/27/16 0527  10/30/16 0603 10/31/16 0638 11/01/16 0700  WBC 16.4*   < > 14.2*  < > 12.3* 11.8* 10.5  NEUTROABS 13.0*  --  10.3*  --   --   --  7.1  HGB 15.4  < > 13.5  < > 12.1* 12.3* 13.2  HCT 47.0  < > 41.7  < > 37.6* 38.2* 40.2  MCV 92.5  < > 91.2  < > 92.2 92.3 91.6  PLT 347  < > 282  < > 280 277 310  < > = values in this interval not displayed. Cardiac Enzymes:  Recent Labs  10/21/16 1940  10/22/16 0015 10/22/16 0612  TROPONINI 0.05* 0.05* 0.03*   BNP: Invalid input(s): POCBNP Lab Results  Component Value Date   HGBA1C  03/27/2009    6.0 (NOTE) The ADA recommends the following therapeutic goal for glycemic control related to Hgb A1c measurement: Goal of therapy: <6.5 Hgb A1c  Reference: American Diabetes Association: Clinical Practice Recommendations 2010, Diabetes Care, 2010, 33: (Suppl  1).   Lab Results  Component Value Date   TSH <0.010 (L) 10/27/2016   No results found for: VITAMINB12 No results found for: FOLATE No results found for: IRON, TIBC, FERRITIN  Imaging and Procedures obtained prior to SNF admission: No results found.  Assessment/Plan There are no diagnoses linked to this encounter.   Family/ staff Communication:   Labs/tests ordered:

## 2016-11-03 ENCOUNTER — Encounter (HOSPITAL_COMMUNITY): Payer: Self-pay | Admitting: *Deleted

## 2016-11-03 ENCOUNTER — Emergency Department (HOSPITAL_COMMUNITY): Payer: Medicare Other

## 2016-11-03 ENCOUNTER — Inpatient Hospital Stay (HOSPITAL_COMMUNITY)
Admission: EM | Admit: 2016-11-03 | Discharge: 2016-11-05 | DRG: 389 | Disposition: A | Discharge status: Skilled Nursing Facility | Payer: Medicare Other | Attending: Internal Medicine | Admitting: Internal Medicine

## 2016-11-03 DIAGNOSIS — Z7952 Long term (current) use of systemic steroids: Secondary | ICD-10-CM | POA: Diagnosis not present

## 2016-11-03 DIAGNOSIS — G8191 Hemiplegia, unspecified affecting right dominant side: Secondary | ICD-10-CM | POA: Diagnosis not present

## 2016-11-03 DIAGNOSIS — Z86711 Personal history of pulmonary embolism: Secondary | ICD-10-CM

## 2016-11-03 DIAGNOSIS — I69328 Other speech and language deficits following cerebral infarction: Secondary | ICD-10-CM | POA: Diagnosis not present

## 2016-11-03 DIAGNOSIS — I69391 Dysphagia following cerebral infarction: Secondary | ICD-10-CM | POA: Diagnosis not present

## 2016-11-03 DIAGNOSIS — Z86718 Personal history of other venous thrombosis and embolism: Secondary | ICD-10-CM | POA: Diagnosis not present

## 2016-11-03 DIAGNOSIS — Z79899 Other long term (current) drug therapy: Secondary | ICD-10-CM | POA: Diagnosis not present

## 2016-11-03 DIAGNOSIS — J441 Chronic obstructive pulmonary disease with (acute) exacerbation: Secondary | ICD-10-CM | POA: Diagnosis not present

## 2016-11-03 DIAGNOSIS — I639 Cerebral infarction, unspecified: Secondary | ICD-10-CM | POA: Diagnosis not present

## 2016-11-03 DIAGNOSIS — I10 Essential (primary) hypertension: Secondary | ICD-10-CM | POA: Diagnosis not present

## 2016-11-03 DIAGNOSIS — R109 Unspecified abdominal pain: Secondary | ICD-10-CM | POA: Diagnosis not present

## 2016-11-03 DIAGNOSIS — R140 Abdominal distension (gaseous): Secondary | ICD-10-CM

## 2016-11-03 DIAGNOSIS — Z7901 Long term (current) use of anticoagulants: Secondary | ICD-10-CM

## 2016-11-03 DIAGNOSIS — K56609 Unspecified intestinal obstruction, unspecified as to partial versus complete obstruction: Secondary | ICD-10-CM | POA: Diagnosis not present

## 2016-11-03 DIAGNOSIS — G8190 Hemiplegia, unspecified affecting unspecified side: Secondary | ICD-10-CM | POA: Diagnosis present

## 2016-11-03 DIAGNOSIS — K760 Fatty (change of) liver, not elsewhere classified: Secondary | ICD-10-CM | POA: Diagnosis present

## 2016-11-03 DIAGNOSIS — Q438 Other specified congenital malformations of intestine: Secondary | ICD-10-CM | POA: Diagnosis not present

## 2016-11-03 DIAGNOSIS — Z880 Allergy status to penicillin: Secondary | ICD-10-CM

## 2016-11-03 DIAGNOSIS — R293 Abnormal posture: Secondary | ICD-10-CM | POA: Diagnosis not present

## 2016-11-03 DIAGNOSIS — Z993 Dependence on wheelchair: Secondary | ICD-10-CM | POA: Diagnosis not present

## 2016-11-03 DIAGNOSIS — E876 Hypokalemia: Secondary | ICD-10-CM | POA: Diagnosis present

## 2016-11-03 DIAGNOSIS — K562 Volvulus: Secondary | ICD-10-CM | POA: Diagnosis not present

## 2016-11-03 DIAGNOSIS — R279 Unspecified lack of coordination: Secondary | ICD-10-CM | POA: Diagnosis not present

## 2016-11-03 DIAGNOSIS — R471 Dysarthria and anarthria: Secondary | ICD-10-CM | POA: Diagnosis present

## 2016-11-03 DIAGNOSIS — R4702 Dysphasia: Secondary | ICD-10-CM | POA: Diagnosis present

## 2016-11-03 DIAGNOSIS — J189 Pneumonia, unspecified organism: Secondary | ICD-10-CM | POA: Diagnosis not present

## 2016-11-03 DIAGNOSIS — J449 Chronic obstructive pulmonary disease, unspecified: Secondary | ICD-10-CM | POA: Diagnosis present

## 2016-11-03 DIAGNOSIS — R1312 Dysphagia, oropharyngeal phase: Secondary | ICD-10-CM | POA: Diagnosis not present

## 2016-11-03 DIAGNOSIS — K8020 Calculus of gallbladder without cholecystitis without obstruction: Secondary | ICD-10-CM | POA: Diagnosis not present

## 2016-11-03 DIAGNOSIS — M6281 Muscle weakness (generalized): Secondary | ICD-10-CM | POA: Diagnosis not present

## 2016-11-03 LAB — DIFFERENTIAL
Basophils Absolute: 0 10*3/uL (ref 0.0–0.1)
Basophils Relative: 0 %
EOS PCT: 2 %
Eosinophils Absolute: 0.3 10*3/uL (ref 0.0–0.7)
LYMPHS PCT: 13 %
Lymphs Abs: 2 10*3/uL (ref 0.7–4.0)
MONO ABS: 1.2 10*3/uL — AB (ref 0.1–1.0)
MONOS PCT: 8 %
Neutro Abs: 11.7 10*3/uL — ABNORMAL HIGH (ref 1.7–7.7)
Neutrophils Relative %: 77 %

## 2016-11-03 LAB — PROTIME-INR
INR: 1.4
Prothrombin Time: 17.3 seconds — ABNORMAL HIGH (ref 11.4–15.2)

## 2016-11-03 LAB — COMPREHENSIVE METABOLIC PANEL
ALT: 42 U/L (ref 17–63)
AST: 26 U/L (ref 15–41)
Albumin: 2.9 g/dL — ABNORMAL LOW (ref 3.5–5.0)
Alkaline Phosphatase: 51 U/L (ref 38–126)
Anion gap: 5 (ref 5–15)
BUN: 17 mg/dL (ref 6–20)
CHLORIDE: 110 mmol/L (ref 101–111)
CO2: 29 mmol/L (ref 22–32)
Calcium: 8.3 mg/dL — ABNORMAL LOW (ref 8.9–10.3)
Creatinine, Ser: 0.95 mg/dL (ref 0.61–1.24)
GFR calc Af Amer: >60 mL/min (ref >60)
GFR calc non Af Amer: >60 mL/min (ref >60)
Glucose, Bld: 147 mg/dL — ABNORMAL HIGH (ref 65–99)
POTASSIUM: 3.1 mmol/L — AB (ref 3.5–5.1)
Sodium: 144 mmol/L (ref 135–145)
Total Bilirubin: 0.4 mg/dL (ref 0.3–1.2)
Total Protein: 6.4 g/dL — ABNORMAL LOW (ref 6.5–8.1)

## 2016-11-03 LAB — CBC
HEMATOCRIT: 41.5 % (ref 39.0–52.0)
Hemoglobin: 13.5 g/dL (ref 13.0–17.0)
MCH: 29.8 pg (ref 26.0–34.0)
MCHC: 32.5 g/dL (ref 30.0–36.0)
MCV: 91.6 fL (ref 78.0–100.0)
Platelets: 300 10*3/uL (ref 150–400)
RBC: 4.53 MIL/uL (ref 4.22–5.81)
RDW: 13.8 % (ref 11.5–15.5)
WBC: 15.1 10*3/uL — ABNORMAL HIGH (ref 4.0–10.5)

## 2016-11-03 LAB — LIPASE, BLOOD: Lipase: 17 U/L (ref 11–51)

## 2016-11-03 MED ORDER — RESOURCE THICKENUP CLEAR PO POWD
ORAL | Status: DC | PRN
  Filled 2016-11-03: qty 125

## 2016-11-03 MED ORDER — SYSTANE LID WIPES EX PADS: MEDICATED_PAD | Freq: Two times a day (BID) | CUTANEOUS | Status: DC

## 2016-11-03 MED ORDER — ACETAMINOPHEN 325 MG PO TABS: 650.0000 mg | ORAL_TABLET | Freq: Four times a day (QID) | ORAL | Status: DC | PRN

## 2016-11-03 MED ORDER — LISINOPRIL 10 MG PO TABS
20.0000 mg | ORAL_TABLET | Freq: Every day | ORAL | Status: DC
  Administered 2016-11-03 – 2016-11-05 (×3): 20 mg via ORAL
  Filled 2016-11-03 (×3): qty 2

## 2016-11-03 MED ORDER — SENNOSIDES-DOCUSATE SODIUM 8.6-50 MG PO TABS
1.0000 | ORAL_TABLET | Freq: Every day | ORAL | Status: DC
  Administered 2016-11-03 – 2016-11-04 (×2): 1 via ORAL
  Filled 2016-11-03 (×2): qty 1

## 2016-11-03 MED ORDER — POTASSIUM CHLORIDE ER 10 MEQ PO TBCR
40.0000 meq | EXTENDED_RELEASE_TABLET | Freq: Two times a day (BID) | ORAL | Status: DC
  Administered 2016-11-03 – 2016-11-04 (×2): 40 meq via ORAL
  Filled 2016-11-03 (×6): qty 4

## 2016-11-03 MED ORDER — ARTIFICIAL TEARS OPHTHALMIC OINT
TOPICAL_OINTMENT | Freq: Every day | OPHTHALMIC | Status: DC
  Administered 2016-11-03 – 2016-11-04 (×2): via OPHTHALMIC
  Filled 2016-11-03: qty 3.5

## 2016-11-03 MED ORDER — ALBUTEROL SULFATE (2.5 MG/3ML) 0.083% IN NEBU: 2.5000 mg | INHALATION_SOLUTION | RESPIRATORY_TRACT | Status: DC | PRN

## 2016-11-03 MED ORDER — IPRATROPIUM-ALBUTEROL 0.5-2.5 (3) MG/3ML IN SOLN
3.0000 mL | Freq: Three times a day (TID) | RESPIRATORY_TRACT | Status: DC
  Administered 2016-11-04 – 2016-11-05 (×5): 3 mL via RESPIRATORY_TRACT
  Filled 2016-11-03 (×5): qty 3

## 2016-11-03 MED ORDER — POLYETHYLENE GLYCOL 3350 17 G PO PACK
17.0000 g | PACK | Freq: Every day | ORAL | Status: DC
  Administered 2016-11-03: 17 g via ORAL
  Filled 2016-11-03: qty 1

## 2016-11-03 MED ORDER — LACTULOSE 10 GM/15ML PO SOLN
20.0000 g | Freq: Two times a day (BID) | ORAL | Status: DC
  Administered 2016-11-03: 20 g via ORAL
  Filled 2016-11-03: qty 30

## 2016-11-03 MED ORDER — IPRATROPIUM-ALBUTEROL 0.5-2.5 (3) MG/3ML IN SOLN
3.0000 mL | Freq: Four times a day (QID) | RESPIRATORY_TRACT | Status: DC
  Administered 2016-11-03: 3 mL via RESPIRATORY_TRACT
  Filled 2016-11-03: qty 3

## 2016-11-03 MED ORDER — POTASSIUM CHLORIDE 10 MEQ/100ML IV SOLN
10.0000 meq | INTRAVENOUS | Status: AC
  Administered 2016-11-03 (×4): 10 meq via INTRAVENOUS
  Filled 2016-11-03: qty 100

## 2016-11-03 MED ORDER — SYSTANE NIGHTTIME OP OINT: 1.0000 "application " | TOPICAL_OINTMENT | Freq: Every day | OPHTHALMIC | Status: DC

## 2016-11-03 MED ORDER — POLYVINYL ALCOHOL 1.4 % OP SOLN
1.0000 [drp] | Freq: Two times a day (BID) | OPHTHALMIC | Status: DC
  Administered 2016-11-03 – 2016-11-04 (×3): 1 [drp] via OPHTHALMIC
  Filled 2016-11-03: qty 15

## 2016-11-03 MED ORDER — ONDANSETRON HCL 4 MG PO TABS: 4.0000 mg | ORAL_TABLET | Freq: Four times a day (QID) | ORAL | Status: DC | PRN

## 2016-11-03 MED ORDER — ACETAMINOPHEN 650 MG RE SUPP: 650.0000 mg | Freq: Four times a day (QID) | RECTAL | Status: DC | PRN

## 2016-11-03 MED ORDER — ENOXAPARIN SODIUM 100 MG/ML ~~LOC~~ SOLN
1.0000 mg/kg | Freq: Two times a day (BID) | SUBCUTANEOUS | Status: DC
  Administered 2016-11-03 – 2016-11-05 (×4): 95 mg via SUBCUTANEOUS
  Filled 2016-11-03 (×4): qty 1

## 2016-11-03 MED ORDER — LABETALOL HCL 200 MG PO TABS
200.0000 mg | ORAL_TABLET | Freq: Every day | ORAL | Status: DC
  Administered 2016-11-03 – 2016-11-05 (×3): 200 mg via ORAL
  Filled 2016-11-03 (×3): qty 1

## 2016-11-03 MED ORDER — POTASSIUM CHLORIDE IN NACL 40-0.9 MEQ/L-% IV SOLN
INTRAVENOUS | Status: DC
  Administered 2016-11-03: 50 mL/h via INTRAVENOUS

## 2016-11-03 MED ORDER — IPRATROPIUM BROMIDE 0.02 % IN SOLN: 0.5000 mg | Freq: Four times a day (QID) | RESPIRATORY_TRACT | Status: DC

## 2016-11-03 MED ORDER — ONDANSETRON HCL 4 MG/2ML IJ SOLN: 4.0000 mg | Freq: Four times a day (QID) | INTRAMUSCULAR | Status: DC | PRN

## 2016-11-03 NOTE — ED Notes (Signed)
1061f red rubber catheter with no balloon inserted into rectum without resistance with expulsion of gas and some liquid stool after removal of rectal tube.  Per Dr. Karilyn Cotaehman, leave this taped in place to continue to decompress.  Hooked to foley bag to allow drainage of stool.  Pt tolerated this well and reports pain is improving.

## 2016-11-03 NOTE — ED Notes (Signed)
Materials called for 5073f red rubber catheter.

## 2016-11-03 NOTE — Progress Notes (Signed)
ANTICOAGULATION CONSULT NOTE  Pharmacy Consult for Lovenox Indication: Hx pulmonary embolus and DVT  Allergies  Allergen Reactions  . Penicillins     Has patient had a PCN reaction causing immediate rash, facial/tongue/throat swelling, SOB or lightheadedness with hypotension: unknown Has patient had a PCN reaction causing severe rash involving mucus membranes or skin necrosis: unknown Has patient had a PCN reaction that required hospitalization: unknown Has patient had a PCN reaction occurring within the last 10 years: unknown If all of the above answers are "NO", then may proceed with Cephalosporin use. 5/3 Tolerated Cefepime x 1 dose in ED.    Patient Measurements: Height: 5\' 10"  (177.8 cm) Weight: 205 lb (93 kg) IBW/kg (Calculated) : 73  Vital Signs: Temp: 97.7 F (36.5 C) (05/16 1600) Temp Source: Oral (05/16 1600) BP: 149/81 (05/16 1600) Pulse Rate: 87 (05/16 1600)  Labs:  Recent Labs  11/01/16 0700 11/02/16 0730 11/03/16 0455 11/03/16 0721  HGB 13.2  --  13.5  --   HCT 40.2  --  41.5  --   PLT 310  --  300  --   LABPROT 16.4* 15.6*  --  17.3*  INR 1.31 1.23  --  1.40  CREATININE 0.85 0.86 0.95  --    Estimated Creatinine Clearance: 82.9 mL/min (by C-G formula based on SCr of 0.95 mg/dL).  Medical History: Past Medical History:  Diagnosis Date  . Dysphasia   . Fatty liver   . Hemiplegia (HCC)   . Hypertension   . Pneumonia   . Pulmonary embolism (HCC)   . Stroke [facility](HCC)    Medications:  Prescriptions Prior to Admission  Medication Sig Dispense Refill Last Dose  . acetaminophen (TYLENOL) 325 MG tablet Take 650 mg by mouth every 6 (six) hours as needed for moderate pain.    Past Week at Unknown time  . betamethasone dipropionate (DIPROLENE) 0.05 % cream Apply to legs and hands. Do not apply to face groin, axilla. Try Cerave first twice a day.   11/02/2016 at Unknown time  . Eyelid Cleansers (SYSTANE LID WIPES EX) Apply 1 application topically 2 (two)  times daily. Start occusoft lid scrubs: clean eyelids of oil and debris BID OU ( each eye ) before administering eye drops.    11/02/2016 at Unknown time  . ipratropium (ATROVENT) 0.02 % nebulizer solution Take 0.5 mg by nebulization every 6 (six) hours.   11/03/2016 at Unknown time  . labetalol (NORMODYNE) 200 MG tablet Take 200 mg by mouth daily. For HTN   11/02/2016 at Unknown time  . lactulose (CHRONULAC) 10 GM/15ML solution Take 20 g by mouth 2 (two) times daily.   Past Week at Unknown time  . lisinopril (PRINIVIL,ZESTRIL) 20 MG tablet Take 20 mg by mouth daily.   11/02/2016 at Unknown time  . loratadine (CLARITIN) 10 MG tablet Take 10 mg by mouth daily.   Past Week at Unknown time  . polyethylene glycol (MIRALAX / GLYCOLAX) packet Take 17 g by mouth at bedtime.   11/02/2016 at Unknown time  . polyvinyl alcohol (LIQUIFILM TEARS) 1.4 % ophthalmic solution Place 1 drop into both eyes 2 (two) times daily.   11/02/2016 at Unknown time  . potassium chloride (K-DUR) 10 MEQ tablet Take 1 tablet (10 mEq total) by mouth 3 (three) times daily. (Patient taking differently: Take 40 mEq by mouth 2 (two) times daily. ) 30 tablet 1 11/02/2016 at Unknown time  . predniSONE (DELTASONE) 20 MG tablet Take 40 mg by mouth daily with  breakfast.   11/02/2016 at Unknown time  . sennosides-docusate sodium (SENOKOT-S) 8.6-50 MG tablet Take 1 tablet by mouth at bedtime. For constipation   11/02/2016 at Unknown time  . warfarin (COUMADIN) 3 MG tablet Take 3 mg by mouth daily.   11/02/2016 at 1700  . White Petrolatum-Mineral Oil (SYSTANE NIGHTTIME) OINT Place 1 application into the left eye at bedtime. Apply to left eye at night    11/02/2016 at Unknown time  . Emollient (CERAVE) CREA Apply 1 application topically daily as needed for itching once a day   unknown   Assessment: Okay for Protocol, INR below goal. Lovenox bridge until potential surgical plans finalized.  Recent elevated INR last admission.   Goal of Therapy:  Systemic  Anticoagulation.  Monitor platelets by anticoagulation protocol: Yes   Plan:  Lovenox 1mg /kg SQ every 12 hours. Monitor for signs and symptoms of bleeding.  CBC every 48 hours.  Mady Gemma 11/03/2016,6:28 PM

## 2016-11-03 NOTE — Progress Notes (Signed)
Referring Provider: Michele Mcalpine, DO Primary Care Physician:  Mahlon Gammon, MD Primary Gastroenterologist:  Dr. Karilyn Cota  Reason for Consultation:    Recurrent sigmoid volvulus.  HPI:   Patient is 71 year old African-American male resident of pain center where he has been cared for for several years. 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 Patient was treated for sigmoid volvulus on 10/21/2016. He was easily decompressed at flexible sigmoidoscopy. He had to be decompressed again 4 days later. He returned last week and was easily decompressed with rectal catheter. He was discharged on 10/30/2016. His stools have been loose and he had flexiseal. in place. Patient was noted to have develop abdominal distention yesterday and he was also complaining of pain and he was not passing stool while Flexi-Seal like he was before. Patient was therefore transferred to emergency room for evaluation. He was felt to have recurrent sigmoid volvulus. Dr. Clarene Duke contacted me. I advised that decompression be attempted by placing rectal tube which she was able to do without any difficulty. Patient has been hospitalized for further management. He denies abdominal pain nausea or vomiting.    Past Medical History:  Diagnosis Date  . Dysphasia   . Fatty liver   . Hemiplegia (HCC)   . Hypertension   . Pneumonia   . Pulmonary embolism (HCC)   . Stroke [facility])     Past Surgical History:  Procedure Laterality Date  . FLEXIBLE SIGMOIDOSCOPY N/A 10/21/2016   Procedure: FLEXIBLE SIGMOIDOSCOPY;  Surgeon: Malissa Hippo, MD;  Location: [facility];  Service: Endoscopy;  Laterality: N/A;  . FLEXIBLE SIGMOIDOSCOPY N/A 10/25/2016   Procedure: FLEXIBLE SIGMOIDOSCOPY;  Surgeon: Malissa Hippo, MD;  Location: [facility];  Service: Endoscopy;  Laterality: N/A;  . unable      Prior to Admission medications   Medication Sig Start Date End Date Taking? Authorizing Provider   acetaminophen (TYLENOL) 325 MG tablet Take 650 mg by mouth every 6 (six) hours as needed for moderate pain.    Yes [provider]  betamethasone dipropionate (DIPROLENE) 0.05 % cream Apply to legs and hands. Do not apply to face groin, axilla. Try Cerave first twice a day.   Yes [provider]  Eyelid Cleansers (SYSTANE LID WIPES EX) Apply 1 application topically 2 (two) times daily. Start occusoft lid scrubs: clean eyelids of oil and debris BID OU ( each eye ) before administering eye drops.    Yes [provider]  ipratropium (ATROVENT) 0.02 % nebulizer solution Take 0.5 mg by nebulization every 6 (six) hours.   Yes [provider]  labetalol (NORMODYNE) 200 MG tablet Take 200 mg by mouth daily. For HTN   Yes [provider]  lactulose (CHRONULAC) 10 GM/15ML solution Take 20 g by mouth 2 (two) times daily.   Yes [provider]  lisinopril (PRINIVIL,ZESTRIL) 20 MG tablet Take 20 mg by mouth daily.   Yes [provider]  loratadine (CLARITIN) 10 MG tablet Take 10 mg by mouth daily.   Yes [provider]  polyethylene glycol (MIRALAX / GLYCOLAX) packet Take 17 g by mouth at bedtime.   Yes [provider]  polyvinyl alcohol (LIQUIFILM TEARS) 1.4 % ophthalmic solution Place 1 drop into both eyes 2 (two) times daily.   Yes [provider]  potassium chloride (K-DUR) 10 MEQ tablet Take 1 tablet (10 mEq total) by mouth 3 (three) times daily. Patient taking differently: Take 40 mEq by mouth 2 (  two) times daily.  10/30/16  Yes Houston Siren, MD  predniSONE (DELTASONE) 20 MG tablet Take 40 mg by mouth daily with breakfast.   Yes [provider]  sennosides-docusate sodium (SENOKOT-S) 8.6-50 MG tablet Take 1 tablet by mouth at bedtime. For constipation   Yes [provider]  warfarin (COUMADIN) 3 MG tablet Take 3 mg by mouth daily.   Yes [provider]  White Petrolatum-Mineral Oil (SYSTANE  NIGHTTIME) OINT Place 1 application into the left eye at bedtime. Apply to left eye at night    Yes [provider]  Emollient (CERAVE) CREA Apply 1 application topically daily as needed for itching once a day    [provider]    Current Facility-Administered Medications  Medication Dose Route Frequency Provider Last Rate Last Dose  . 0.9 % NaCl with KCl 40 mEq / L  infusion   Intravenous Continuous Erick Blinks, MD      . acetaminophen (TYLENOL) tablet 650 mg  650 mg Oral Q6H PRN Erick Blinks, MD       Or  . acetaminophen (TYLENOL) suppository 650 mg  650 mg Rectal Q6H PRN Erick Blinks, MD      . ipratropium (ATROVENT) nebulizer solution 0.5 mg  0.5 mg Nebulization Q6H Memon, Jehanzeb, MD      . labetalol (NORMODYNE) tablet 200 mg  200 mg Oral Daily Memon, Durward Mallard, MD      . lactulose (CHRONULAC) 10 GM/15ML solution 20 g  20 g Oral BID Erick Blinks, MD      . lisinopril (PRINIVIL,ZESTRIL) tablet 20 mg  20 mg Oral Daily Memon, Durward Mallard, MD      . ondansetron (ZOFRAN) tablet 4 mg  4 mg Oral Q6H PRN Erick Blinks, MD       Or  . ondansetron (ZOFRAN) injection 4 mg  4 mg Intravenous Q6H PRN Erick Blinks, MD      . polyethylene glycol (MIRALAX / GLYCOLAX) packet 17 g  17 g Oral QHS Memon, Jehanzeb, MD      . polyvinyl alcohol (LIQUIFILM TEARS) 1.4 % ophthalmic solution 1 drop  1 drop Both Eyes BID Memon, Durward Mallard, MD      . potassium chloride (K-DUR) CR tablet 40 mEq  40 mEq Oral BID Memon, Durward Mallard, MD      . potassium chloride 10 mEq in 100 mL IVPB  10 mEq Intravenous Q1 Hr x 4 Memon, Jehanzeb, MD      . sennosides-docusate sodium (SENOKOT-S) 8.6-50 MG tablet 1 tablet  1 tablet Oral QHS Erick Blinks, MD      . Frazier Butt LID WIPES PADS   Topical BID Erick Blinks, MD      . SYSTANE NIGHTTIME OINT 1 application  1 application Left Eye QHS Erick Blinks, MD        Allergies as of 11/03/2016 - Review Complete 11/03/2016  Allergen Reaction Noted  .  Penicillins      History reviewed. No pertinent family history.  Social History   Social History  . Marital status: Single    Spouse name: N/A  . Number of children: N/A  . Years of education: N/A   Occupational History  . Not on file.   Social History Main Topics  . Smoking status: Never Smoker  . Smokeless tobacco: Never Used  . Alcohol use No  . Drug use: No  . Sexual activity: Not on file   Other Topics Concern  . Not on file   Social History Narrative  .  No narrative on file    Review of Systems: See HPI, otherwise normal ROS  Physical Exam: Temp:  [98.3 F (36.8 C)] 98.3 F (36.8 C) (05/16 0442) Pulse Rate:  [86-98] 86 (05/16 1200) Resp:  [14-20] 14 (05/16 0900) BP: (104-169)/(73-117) 109/77 (05/16 1200) SpO2:  [91 %-99 %] 99 % (05/16 1200) Weight:  [205 lb (93 kg)] 205 lb (93 kg) (05/16 0440) Last BM Date: 11/03/16  Patient is alert and appears comfortable in bed. He has impaired difficult to understand speech. Left facial weakness noted. Abdomen is full but not tense or tender. Bowel sounds are normal. Aggression noticed tympanitic. No organomegaly or masses noted. 1+ pitting edema noted to both legs.  There is over 200 mL of thick liquid stool and plastic bag.  Lab Results:  Recent Labs  11/01/16 0700 11/03/16 0455  WBC 10.5 15.1*  HGB 13.2 13.5  HCT 40.2 41.5  PLT 310 300   BMET  Recent Labs  11/01/16 0700 11/02/16 0730 11/03/16 0455  NA 146* 147* 144  K 2.8* 3.3* 3.1*  CL 110 111 110  CO2 29 30 29   GLUCOSE 82 101* 147*  BUN 14 12 17   CREATININE 0.85 0.86 0.95  CALCIUM 8.1* 8.4* 8.3*   LFT  Recent Labs  11/03/16 0455  PROT 6.4*  ALBUMIN 2.9*  AST 26  ALT 42  ALKPHOS 51  BILITOT 0.4   PT/INR  Recent Labs  11/02/16 0730 11/03/16 0721  LABPROT 15.6* 17.3*  INR 1.23 1.40    Studies/Results: Dg Abd 1 View  Result Date: 11/03/2016 CLINICAL DATA:  Abdominal bloating. EXAM: ABDOMEN - 1 VIEW COMPARISON:  CT scan  and radiographs of same day. FINDINGS: There remains severe dilatation of the sigmoid colon consistent with sigmoid volvulus as described on prior CT scan. No small bowel dilatation is noted. Rectal tube is noted. Proximal colonic dilatation noted on prior exam appears to be significantly improved. IMPRESSION: Rectal tube is noted. Proximal colonic dilatation noted on prior exam appears to be significantly improved, although there is continued severe dilatation the sigmoid colon consistent with sigmoid volvulus as described on prior CT scan. Electronically Signed   By: Lupita RaiderJames  Green Jr, M.D.   On: 11/03/2016 10:54   Dg Abd Acute W/chest  Result Date: 11/03/2016 CLINICAL DATA:  Abdominal pain for 2 days.  Abdominal distension. EXAM: DG ABDOMEN ACUTE W/ 1V CHEST COMPARISON:  Abdominal radiograph 10/28/2016, abdominal CT 10/27/2016 FINDINGS: Very low lung volumes with bibasilar atelectasis. Heart size accentuated by low lung volumes. There is tortuosity of the thoracic aorta. No large pleural effusion. Diffuse gas just distention of colon, increased colonic distention from prior exam. Sigmoid distention is similar to prior exam. No evidence of free air. No small bowel dilatation. No radiopaque calculi. No acute osseous abnormalities are seen. IMPRESSION: Progressive proximal colonic distention, significant sigmoid distention is similar. Bowel gas pattern appears similar to CT 10/27/2016. No evidence of free air. Low lung volumes with bibasilar atelectasis. Electronically Signed   By: Rubye OaksMelanie  Ehinger M.D.   On: 11/03/2016 06:06   Ct Renal Stone Study  Result Date: 11/03/2016 CLINICAL DATA:  Abdominal pain 2 days. Distended abdomen. History of volvulus. EXAM: CT ABDOMEN AND PELVIS WITHOUT CONTRAST TECHNIQUE: Multidetector CT imaging of the abdomen and pelvis was performed following the standard protocol without IV contrast. COMPARISON:  KUB 11/03/2016.  CT abdomen pelvis 10/27/2016 FINDINGS: Lower chest: Mild  bibasilar atelectasis and small bilateral effusions. Cardiac enlargement.  Extensive coronary calcification. Hepatobiliary: Contracted  gallbladder with multiple gallstones. No gallbladder wall thickening or biliary dilatation. 15 mm hepatic cyst on the right. Pancreas: Negative Spleen: Negative Adrenals/Urinary Tract: Negative for renal mass or obstruction. Small bilateral renal calculi. Negative urinary bladder. Stomach/Bowel: Severe colonic dilatation with multiple air-fluid levels. Transition point in the rectosigmoid compatible with sigmoid volvulus. This is the same location with a similar appearance to the CT of 10/21/2016. Rectal catheter is in place. Rectum decompressed. Layering oral contrast is seen in the colon, from recent swallowing function study. Small hiatal hernia.  Negative for small bowel dilatation. Vascular/Lymphatic: Extensive atherosclerotic disease. No aortic aneurysm. Negative for lymphadenopathy. Reproductive: Normal prostate Other: Mild presacral soft tissue edema, increased from the prior study. Negative for abscess. Musculoskeletal: Disc degeneration and spondylosis in the lower lumbar spine. No acute skeletal abnormality. 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 This appearance is similar to the CT of 10/21/2016. Cholelithiasis Atherosclerotic disease.  Extensive coronary artery calcification Small hiatal hernia Small bilateral renal calculi without renal obstruction. Electronically Signed   By: Marlan Palau M.D.   On: 11/03/2016 08:26  CT images reviewed.  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 Initially he was decompressed at flexible sigmoidoscopy and last week rectal tube did the trick. He now returns with recurrence and has quickly decompressed with rectal tube that was placed by Dr. Clarene Duke in emergency room. Definite in therapy for this condition would be  sigmoid colon resection but patient is deemed to be high risk. Will wait for input from Dr. Lovell Sheehan of surgical service. In the meantime he will be treated with rectal tube when necessary.  Mild presacral soft tissue edema most likely related to flexi-seal.  Patient appears to have difficulty clearing his throat of secretions and will benefit from bedside oropharyngeal suction set up.  Patient's condition discussed with his brother Mr. Marc Leichter who is a power of attorney.  Recommendations;  Advance diet heart healthy. Leave rectal tube in for 24 hours. Oropharyngeal suction when necessary.   LOS: 0 days   Yeilyn Gent  11/03/2016, 4:27 PM

## 2016-11-03 NOTE — ED Notes (Signed)
Pt. To x-ray .

## 2016-11-03 NOTE — ED Notes (Signed)
Significant decompression of abdomen.  Abdomen is soft and nontender at this time with active bowel sounds.  Pt reports he is more comfortable.  Straight catheter left taped to leg hooked to drainage bag.  tan, liquid stool noted in bag.

## 2016-11-03 NOTE — ED Triage Notes (Signed)
Pt c/o abdominal pain x 2 days; pt has distended abdomen and states he is unable to have a BM; pt has flexiseal in with small amt of liquid brown stool

## 2016-11-03 NOTE — ED Provider Notes (Signed)
[facility] DEPT Provider Note   CSN: [id number] Arrival date & time: 11/03/16  0436     History   Chief Complaint Chief Complaint  Patient presents with  . Abdominal Pain    HPI [name] [name] is a 71 y.o. male.  The history is provided by the nursing home. 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  Abdominal Pain    Pt was seen at 0705. Per NH report: Pt c/o abd pain x2 days. Pt states he is unable to have a BM. Pt has significant hx of recurrent volvulus and was most recently discharged from the hospital 4 days ago.   Past Medical History:  Diagnosis Date  . Dysphasia   . Fatty liver   . Hemiplegia (HCC)   . Hypertension   . Pneumonia   . Pulmonary embolism (HCC)   . Stroke [facility])     Patient Active Problem List   Diagnosis Date Noted  . Pressure injury of skin 10/28/2016  . Colonic obstruction (HCC) 10/27/2016  . Stroke (HCC) 10/27/2016  . Pulmonary embolism (HCC) 10/27/2016  . Hypertension 10/27/2016  . Hemiplegia (HCC) 10/27/2016  . Hypokalemia 10/27/2016  . 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 Volvulus (HCC) 10/21/2016  . COPD (chronic obstructive pulmonary disease) (HCC) 10/19/2016  . Skin lesion 10/07/2015  . History of bronchitis 10/05/2015  . Acute bronchitis 03/21/2015  . Edema 03/07/2015  . Stroke/cerebrovascular accident (HCC) 03/20/2014  . Cellulitis 04/10/2013  . Long term current use of anticoagulant therapy 10/21/2012  . Cerebral infarction (HCC) 09/26/2012  . HTN (hypertension) 09/26/2012  . DVT (deep venous thrombosis) (HCC) 09/26/2012  . Chronic pulmonary embolism (HCC) 09/26/2012  . Pneumonia 09/26/2012  . Constipation 09/26/2012  . IMPINGEMENT SYNDROME 02/14/2008    Past Surgical History:  Procedure Laterality Date  . FLEXIBLE SIGMOIDOSCOPY N/A 10/21/2016   Procedure: FLEXIBLE SIGMOIDOSCOPY;  Surgeon: Malissa Hippo, MD;  Location: [facility];  Service: Endoscopy;   Laterality: N/A;  . FLEXIBLE SIGMOIDOSCOPY N/A 10/25/2016   Procedure: FLEXIBLE SIGMOIDOSCOPY;  Surgeon: Malissa Hippo, MD;  Location: [facility];  Service: Endoscopy;  Laterality: N/A;  . unable         Home Medications    Prior to Admission medications   Medication Sig Start Date End Date Taking? Authorizing Provider  acetaminophen (TYLENOL) 325 MG tablet Take 650 mg by mouth every 6 (six) hours as needed for moderate pain.     [provider]  betamethasone dipropionate (DIPROLENE) 0.05 % cream Apply to legs and hands. Do not apply to face groin, axilla. Try Cerave first twice a day.    [provider]  Emollient (CERAVE) CREA Apply 1 application topically daily as needed for itching once a day    [provider]  Eyelid Cleansers (SYSTANE LID WIPES EX) Apply 1 application topically 2 (two) times daily. Start occusoft lid scrubs: clean eyelids of oil and debris BID OU ( each eye ) before administering eye drops.     [provider]  ipratropium (ATROVENT) 0.02 % nebulizer solution Take 0.5 mg by nebulization every 6 (six) hours.    [provider]  labetalol (NORMODYNE) 200 MG tablet Take 200 mg by mouth daily. For HTN    [provider]  lisinopril (PRINIVIL,ZESTRIL) 20 MG tablet Take 20 mg by mouth daily.    [provider]  polyethylene glycol (MIRALAX / GLYCOLAX) packet Take 17 g by mouth at bedtime.  [provider]  polyvinyl alcohol (LIQUIFILM TEARS) 1.4 % ophthalmic solution Place 1 drop into both eyes 2 (two) times daily.    [provider]  potassium chloride (K-DUR) 10 MEQ tablet Take 1 tablet (10 mEq total) by mouth 3 (three) times daily. 10/30/16   Houston Siren, MD  predniSONE (DELTASONE) 20 MG tablet Take 40 mg by mouth daily with breakfast.    [provider]  sennosides-docusate sodium (SENOKOT-S) 8.6-50 MG tablet Take 1 tablet by mouth at bedtime. For constipation    [provider]  warfarin (COUMADIN) 3 MG tablet Take 3 mg by mouth daily.    [provider]  White Petrolatum-Mineral Oil (SYSTANE NIGHTTIME) OINT Place 1 application into the left eye at bedtime. Apply to left eye at night     [provider]    Family History History reviewed. No pertinent family history.  Social History Social History  Substance Use Topics  . Smoking status: Never Smoker  . Smokeless tobacco: Never Used  . Alcohol use No     Allergies   Penicillins   Review of Systems Review of Systems  Unable to perform ROS: Other  Gastrointestinal: Positive for abdominal pain.     Physical Exam Updated Vital Signs BP (!) 159/111 (BP Location: Right Arm)   Pulse 98   Temp 98.3 F (36.8 C) (Oral)   Resp 15   Ht 5\' 10"  (1.778 m)   Wt 205 lb (93 kg)   SpO2 98%   BMI 29.41 kg/m   Physical Exam 0705: Physical examination:  Nursing notes reviewed; Vital signs and O2 SAT reviewed;  Constitutional: Well developed, Well nourished, Well hydrated, In no acute distress; Head:  Normocephalic, atraumatic; Eyes: EOMI, PERRL, No scleral icterus; ENMT: Mouth and pharynx normal, Mucous membranes moist; Neck: Supple, Full range of motion, No lymphadenopathy; Cardiovascular: Regular rate and rhythm, No gallop; Respiratory: Breath sounds clear & equal bilaterally, No wheezes.  Speaking full sentences with ease, Normal respiratory effort/excursion; Chest: Nontender, Movement normal; Abdomen: +generalized TTP, tense, distended, high pitched bowel sounds. Flexi-seal with small amount liquid brown stool.; Genitourinary: No CVA tenderness; Extremities: Pulses normal, No tenderness, No edema, No calf edema or asymmetry.; Neuro: Awake, alert. +left facial droop, slurred speech and left sided hemiparesis per hx..; Skin: Color normal, Warm, Dry.   ED Treatments / Results  Labs (all labs ordered are listed, but only abnormal results are displayed)   EKG  EKG  Interpretation None       Radiology   Procedures Procedures (including critical care time)  Medications Ordered in ED Medications - No data to display   Initial Impression / Assessment and Plan / ED Course  I have reviewed the triage vital signs and the nursing notes.  Pertinent labs & imaging results that were available during my care of the patient were reviewed by me and considered in my medical decision making (see chart for details).  MDM Reviewed: previous chart, nursing note and vitals Reviewed previous: labs and CT scan Interpretation: labs, x-ray and CT scan    Results for orders placed or performed during the hospital encounter of 11/03/16  Lipase, blood  Result Value Ref Range   Lipase 17 11 - 51 U/L  Comprehensive metabolic panel  Result Value Ref Range   Sodium 144 135 - 145 mmol/L   Potassium 3.1 (L) 3.5 - 5.1 mmol/L   Chloride 110 101 - 111 mmol/L   CO2 29 22 - 32 mmol/L  Glucose, Bld 147 (H) 65 - 99 mg/dL   BUN 17 6 - 20 mg/dL   Creatinine, Ser 1.610.95 0.61 - 1.24 mg/dL   Calcium 8.3 (L) 8.9 - 10.3 mg/dL   Total Protein 6.4 (L) 6.5 - 8.1 g/dL   Albumin 2.9 (L) 3.5 - 5.0 g/dL   AST 26 15 - 41 U/L   ALT 42 17 - 63 U/L   Alkaline Phosphatase 51 38 - 126 U/L   Total Bilirubin 0.4 0.3 - 1.2 mg/dL   GFR calc non Af Amer >60 >60 mL/min   GFR calc Af Amer >60 >60 mL/min   Anion gap 5 5 - 15  CBC  Result Value Ref Range   WBC 15.1 (H) 4.0 - 10.5 K/uL   RBC 4.53 4.22 - 5.81 MIL/uL   Hemoglobin 13.5 13.0 - 17.0 g/dL   HCT 09.641.5 04.539.0 - 40.952.0 %   MCV 91.6 78.0 - 100.0 fL   MCH 29.8 26.0 - 34.0 pg   MCHC 32.5 30.0 - 36.0 g/dL   RDW 81.113.8 91.411.5 - 78.215.5 %   Platelets 300 150 - 400 K/uL  Protime-INR  Result Value Ref Range   Prothrombin Time 17.3 (H) 11.4 - 15.2 seconds   INR 1.40   Differential  Result Value Ref Range   Neutrophils Relative % 77 %   Neutro Abs 11.7 (H) 1.7 - 7.7 K/uL   Lymphocytes Relative 13 %   Lymphs Abs 2.0 0.7 - 4.0 K/uL    Monocytes Relative 8 %   Monocytes Absolute 1.2 (H) 0.1 - 1.0 K/uL   Eosinophils Relative 2 %   Eosinophils Absolute 0.3 0.0 - 0.7 K/uL   Basophils Relative 0 %   Basophils Absolute 0.0 0.0 - 0.1 K/uL   Dg Abd Acute W/chest Result Date: 11/03/2016 CLINICAL DATA:  Abdominal pain for 2 days.  Abdominal distension. EXAM: DG ABDOMEN ACUTE W/ 1V CHEST COMPARISON:  Abdominal radiograph 10/28/2016, abdominal CT 10/27/2016 FINDINGS: Very low lung volumes with bibasilar atelectasis. Heart size accentuated by low lung volumes. There is tortuosity of the thoracic aorta. No large pleural effusion. Diffuse gas just distention of colon, increased colonic distention from prior exam. Sigmoid distention is similar to prior exam. No evidence of free air. No small bowel dilatation. No radiopaque calculi. No acute osseous abnormalities are seen. IMPRESSION: Progressive proximal colonic distention, significant sigmoid distention is similar. Bowel gas pattern appears similar to CT 10/27/2016. No evidence of free air. Low lung volumes with bibasilar atelectasis. Electronically Signed   By: Rubye OaksMelanie  Ehinger M.D.   On: 11/03/2016 06:06      Ct Renal Stone Study Result Date: 11/03/2016 CLINICAL DATA:  Abdominal pain 2 days. Distended abdomen. History of volvulus. EXAM: CT ABDOMEN AND PELVIS WITHOUT CONTRAST TECHNIQUE: Multidetector CT imaging of the abdomen and pelvis was performed following the standard protocol without IV contrast. COMPARISON:  KUB 11/03/2016.  CT abdomen pelvis 10/27/2016 FINDINGS: Lower chest: Mild bibasilar atelectasis and small bilateral effusions. Cardiac enlargement.  Extensive coronary calcification. Hepatobiliary: Contracted gallbladder with multiple gallstones. No gallbladder wall thickening or biliary dilatation. 15 mm hepatic cyst on the right. Pancreas: Negative Spleen: Negative Adrenals/Urinary Tract: Negative for renal mass or obstruction. Small bilateral renal calculi. Negative urinary bladder.  Stomach/Bowel: Severe colonic dilatation with multiple air-fluid levels. Transition point in the rectosigmoid compatible with sigmoid volvulus. This is the same location with a similar appearance to the CT of 10/21/2016. Rectal catheter is in place. Rectum decompressed. Layering oral contrast is seen  in the colon, from recent swallowing function study. Small hiatal hernia.  Negative for small bowel dilatation. Vascular/Lymphatic: Extensive atherosclerotic disease. No aortic aneurysm. Negative for lymphadenopathy. Reproductive: Normal prostate Other: Mild presacral soft tissue edema, increased from the prior study. Negative for abscess. Musculoskeletal: Disc degeneration and spondylosis in the lower lumbar spine. No acute skeletal abnormality. 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 This appearance is similar to the CT of 10/21/2016. Cholelithiasis Atherosclerotic disease.  Extensive coronary artery calcification Small hiatal hernia Small bilateral renal calculi without renal obstruction. Electronically Signed   By: Marlan Palau M.D.   On: 11/03/2016 08:26    0900:  T/C to GI Dr. Karilyn Cota, case discussed, including:  HPI, pertinent PM/SHx, VS/PE, dx testing, ED course and treatment:  Requests to pass 37F red rubber catheter into rectum to release air, leave catheter in place, d/c pt back to NH.   1010:  Rubber catheter passed with large amount of air and stool passed. Abd now soft, NT, +umbilical hernia is easily reducible. Will obtain f/u AXR.   Dg Abd 1 View Result Date: 11/03/2016 CLINICAL DATA:  Abdominal bloating. EXAM: ABDOMEN - 1 VIEW COMPARISON:  CT scan and radiographs of same day. FINDINGS: There remains severe dilatation of the sigmoid colon consistent with sigmoid volvulus as described on prior CT scan. No small bowel dilatation is noted. Rectal tube is noted. Proximal colonic dilatation noted on prior exam appears to be significantly improved.  IMPRESSION: Rectal tube is noted. Proximal colonic dilatation noted on prior exam appears to be significantly improved, although there is continued severe dilatation the sigmoid colon consistent with sigmoid volvulus as described on prior CT scan. Electronically Signed   By: Lupita Raider, M.D.   On: 11/03/2016 10:54    1110:  Repeat AXR as above.  T/C to GI Dr. Karilyn Cota, case discussed, including:  HPI, pertinent PM/SHx, VS/PE, dx testing, ED course and treatment:  Requests to admit to [facility], pt will need surgical consult for definitive tx.    1140:  T/C to [facility] Dr. Kerry Hough, case discussed, including:  HPI, pertinent PM/SHx, VS/PE, dx testing, ED course and treatment:  Agreeable to admit.             Final Clinical Impressions(s) / ED Diagnoses   Final diagnoses:  None    New Prescriptions New Prescriptions   No medications on file      Samuel Jester, DO 11/09/16 [id number]

## 2016-11-03 NOTE — H&P (Signed)
History and Physical    SILVESTER REIERSON ZOX:[id number] DOB: 1945/11/30 DOA: 11/03/2016  PCP: Mahlon Gammon, MD  Patient coming from: SNF  I have personally briefly reviewed patient's old medical records in [facility]  Chief Complaint: abdominal pain  HPI: [name] [name] is a 71 y.o. male with medical history significant of sigmoid volvulus, who has had multiple admissions for the same. This will be his third admission in the past 2 weeks. Patient presents with worsening pain in his abdomen for the past 2 days. This is associated with abdominal distention. He has not had any bowel movements or passing flatus. He does not have any vomiting or nausea. He has not had any fever. No cough or worsening shortness of breath  ED Course: Emergency room evaluation indicated distended abdomen. Imaging indicated recurrent colonic distention volvulus. Case was discussed with gastroenterology who recommended placing a rectal tube. This was placed and patient had passage of flatus and stool. Symptoms had improved. Follow-up imaging showed persistent volvulus. Recommendations were for admission and surgical evaluation.  Review of Systems: As per HPI otherwise 10 point review of systems negative.    Past Medical History:  Diagnosis Date  . Dysphasia   . Fatty liver   . Hemiplegia (HCC)   . Hypertension   . Pneumonia   . Pulmonary embolism (HCC)   . Stroke [facility])     Past Surgical History:  Procedure Laterality Date  . FLEXIBLE SIGMOIDOSCOPY N/A 10/21/2016   Procedure: FLEXIBLE SIGMOIDOSCOPY;  Surgeon: Malissa Hippo, MD;  [facility];  Service: Endoscopy;  Laterality: N/A;  . FLEXIBLE SIGMOIDOSCOPY N/A 10/25/2016   Procedure: FLEXIBLE SIGMOIDOSCOPY;  Surgeon: Malissa Hippo, MD;  Location: [facility];  Service: Endoscopy;  Laterality: N/A;  . unable       reports that he has never smoked. He has never used smokeless tobacco. He reports that he does not drink alcohol or  use drugs.  Allergies  Allergen Reactions  . Penicillins     Has patient had a PCN reaction causing immediate rash, facial/tongue/throat swelling, SOB or lightheadedness with hypotension: unknown Has patient had a PCN reaction causing severe rash involving mucus membranes or skin necrosis: unknown Has patient had a PCN reaction that required hospitalization: unknown Has patient had a PCN reaction occurring within the last 10 years: unknown If all of the above answers are "NO", then may proceed with Cephalosporin use. 5/3 Tolerated Cefepime x 1 dose in ED.     Family history: Family history reviewed and not pertinent  Prior to Admission medications   Medication Sig Start Date End Date Taking? Authorizing Provider  acetaminophen (TYLENOL) 325 MG tablet Take 650 mg by mouth every 6 (six) hours as needed for moderate pain.    Yes [provider]  betamethasone dipropionate (DIPROLENE) 0.05 % cream Apply to legs and hands. Do not apply to face groin, axilla. Try Cerave first twice a day.   Yes [provider]  Eyelid Cleansers (SYSTANE LID WIPES EX) Apply 1 application topically 2 (two) times daily. Start occusoft lid scrubs: clean eyelids of oil and debris BID OU ( each eye ) before administering eye drops.    Yes [provider]  ipratropium (ATROVENT) 0.02 % nebulizer solution Take 0.5 mg by nebulization every 6 (six) hours.   Yes [provider]  labetalol (NORMODYNE) 200 MG tablet Take 200 mg by mouth daily. For HTN   Yes [provider]  lactulose (CHRONULAC)  10 GM/15ML solution Take 20 g by mouth 2 (two) times daily.   Yes [provider]  lisinopril (PRINIVIL,ZESTRIL) 20 MG tablet Take 20 mg by mouth daily.   Yes [provider]  loratadine (CLARITIN) 10 MG tablet Take 10 mg by mouth daily.   Yes [provider]  polyethylene glycol (MIRALAX / GLYCOLAX) packet Take 17 g by mouth at bedtime.   Yes [provider]  polyvinyl alcohol (LIQUIFILM TEARS) 1.4 % ophthalmic solution Place 1 drop into both eyes 2 (two) times daily.   Yes [provider]  potassium chloride (K-DUR) 10 MEQ tablet Take 1 tablet (10 mEq total) by mouth 3 (three) times daily. Patient taking differently: Take 40 mEq by mouth 2 (two) times daily.  10/30/16  Yes Houston SirenLe, Peter, MD  predniSONE (DELTASONE) 20 MG tablet Take 40 mg by mouth daily with breakfast.   Yes [provider]  sennosides-docusate sodium (SENOKOT-S) 8.6-50 MG tablet Take 1 tablet by mouth at bedtime. For constipation   Yes [provider]  warfarin (COUMADIN) 3 MG tablet Take 3 mg by mouth daily.   Yes [provider]  White Petrolatum-Mineral Oil (SYSTANE NIGHTTIME) OINT Place 1 application into the left eye at bedtime. Apply to left eye at night    Yes [provider]  Emollient (CERAVE) CREA Apply 1 application topically daily as needed for itching once a day    [provider]    Physical Exam: Vitals:   11/03/16 0700 11/03/16 0900 11/03/16 1100 11/03/16 1200  BP: (!) 159/111 (!) 168/106 104/73 109/77  Pulse: 98 92 89 86  Resp: 15 14    Temp:      TempSrc:      SpO2: 98% 98% 98% 99%  Weight:      Height:        Constitutional: NAD, calm, comfortable Vitals:   11/03/16 0700 11/03/16 0900 11/03/16 1100 11/03/16 1200  BP: (!) 159/111 (!) 168/106 104/73 109/77  Pulse: 98 92 89 86  Resp: 15 14    Temp:      TempSrc:      SpO2: 98% 98% 98% 99%  Weight:      Height:       Eyes: PERRL, lids and conjunctivae normal ENMT: Mucous membranes are moist. Posterior pharynx clear of any exudate or lesions.Normal dentition.  Neck: normal, supple, no masses, no thyromegaly Respiratory: clear to auscultation bilaterally, no wheezing, no crackles. Normal respiratory effort. No accessory muscle use.  Cardiovascular: Regular rate and rhythm, no murmurs / rubs / gallops. trace extremity edema. 2+ pedal  pulses. No carotid bruits.  Abdomen: no tenderness, no masses palpated. No hepatosplenomegaly. Bowel sounds positive.  Musculoskeletal: no clubbing / cyanosis. No joint deformity upper and lower extremities. Contracture of left upper extremity Skin: no rashes, lesions, ulcers. No induration Neurologic: left sided facial droop with left hemiparesis. Speech is dysarthric Psychiatric: Normal judgment and insight. Alert and oriented x 3. Normal mood.   Labs on Admission: I have personally reviewed following labs and imaging studies  CBC:  Recent Labs Lab 10/29/16 0412 10/30/16 0603 10/31/16 0638 11/01/16 0700 11/03/16 0455  WBC 9.9 12.3* 11.8* 10.5 15.1*  NEUTROABS  --   --   --  7.1 11.7*  HGB 12.1* 12.1* 12.3* 13.2 13.5  HCT 37.4* 37.6* 38.2* 40.2 41.5  MCV 93.0 92.2 92.3 91.6 91.6  PLT 263 280 277 310 300   Basic Metabolic Panel:  Recent Labs Lab 10/29/16 0412  10/30/16 0603 10/30/16 1523 11/01/16 0700 11/02/16 0730 11/03/16 0455  NA 147* 145  --  146* 147* 144  K 2.6* 2.7* 3.5 2.8* 3.3* 3.1*  CL 115* 114*  --  110 111 110  CO2 28 27  --  29 30 29   GLUCOSE 96 104*  --  82 101* 147*  BUN 9 12  --  14 12 17   CREATININE 0.89 0.79  --  0.85 0.86 0.95  CALCIUM 7.9* 7.9*  --  8.1* 8.4* 8.3*  MG 1.6*  --   --  1.8  --   --    GFR: Estimated Creatinine Clearance: 82.9 mL/min (by C-G formula based on SCr of 0.95 mg/dL). Liver Function Tests:  Recent Labs Lab 11/03/16 0455  AST 26  ALT 42  ALKPHOS 51  BILITOT 0.4  PROT 6.4*  ALBUMIN 2.9*    Recent Labs Lab 11/03/16 0455  LIPASE 17   No results for input(s): AMMONIA in the last 168 hours. Coagulation Profile:  Recent Labs Lab 10/30/16 0603 10/31/16 0638 11/01/16 0700 11/02/16 0730 11/03/16 0721  INR 1.90 1.49 1.31 1.23 1.40   Cardiac Enzymes: No results for input(s): CKTOTAL, CKMB, CKMBINDEX, TROPONINI in the last 168 hours. BNP (last 3 results) No results for input(s): PROBNP in the last 8760  hours. HbA1C: No results for input(s): HGBA1C in the last 72 hours. CBG: No results for input(s): GLUCAP in the last 168 hours. Lipid Profile: No results for input(s): CHOL, HDL, LDLCALC, TRIG, CHOLHDL, LDLDIRECT in the last 72 hours. Thyroid Function Tests: No results for input(s): TSH, T4TOTAL, FREET4, T3FREE, THYROIDAB in the last 72 hours. Anemia Panel: No results for input(s): VITAMINB12, FOLATE, FERRITIN, TIBC, IRON, RETICCTPCT in the last 72 hours. Urine analysis:    Component Value Date/Time   COLORURINE YELLOW 10/20/2016 0230   APPEARANCEUR CLEAR 10/20/2016 0230   LABSPEC 1.025 10/20/2016 0230   PHURINE 5.5 10/20/2016 0230   GLUCOSEU NEGATIVE 10/20/2016 0230   HGBUR SMALL (A) 10/20/2016 0230   BILIRUBINUR NEGATIVE 10/20/2016 0230   KETONESUR TRACE (A) 10/20/2016 0230   PROTEINUR 100 (A) 10/20/2016 0230   NITRITE POSITIVE (A) 10/20/2016 0230   LEUKOCYTESUR NEGATIVE 10/20/2016 0230    Radiological Exams on Admission: Dg Abd 1 View  Result Date: 11/03/2016 CLINICAL DATA:  Abdominal bloating. EXAM: ABDOMEN - 1 VIEW COMPARISON:  CT scan and radiographs of same day. FINDINGS: There remains severe dilatation of the sigmoid colon consistent with sigmoid volvulus as described on prior CT scan. No small bowel dilatation is noted. Rectal tube is noted. Proximal colonic dilatation noted on prior exam appears to be significantly improved. IMPRESSION: Rectal tube is noted. Proximal colonic dilatation noted on prior exam appears to be significantly improved, although there is continued severe dilatation the sigmoid colon consistent with sigmoid volvulus as described on prior CT scan. Electronically Signed   By: Lupita Raider, M.D.   On: 11/03/2016 10:54   Dg Abd Acute W/chest  Result Date: 11/03/2016 CLINICAL DATA:  Abdominal pain for 2 days.  Abdominal distension. EXAM: DG ABDOMEN ACUTE W/ 1V CHEST COMPARISON:  Abdominal radiograph 10/28/2016, abdominal CT 10/27/2016 FINDINGS: Very low  lung volumes with bibasilar atelectasis. Heart size accentuated by low lung volumes. There is tortuosity of the thoracic aorta. No large pleural effusion. Diffuse gas just distention of colon, increased colonic distention from prior exam. Sigmoid distention is similar to prior exam. No evidence of free air. No small bowel dilatation. No radiopaque calculi. No acute osseous  abnormalities are seen. IMPRESSION: Progressive proximal colonic distention, significant sigmoid distention is similar. Bowel gas pattern appears similar to CT 10/27/2016. No evidence of free air. Low lung volumes with bibasilar atelectasis. Electronically Signed   By: Rubye Oaks M.D.   On: 11/03/2016 06:06   Ct Renal Stone Study  Result Date: 11/03/2016 CLINICAL DATA:  Abdominal pain 2 days. Distended abdomen. History of volvulus. EXAM: CT ABDOMEN AND PELVIS WITHOUT CONTRAST TECHNIQUE: Multidetector CT imaging of the abdomen and pelvis was performed following the standard protocol without IV contrast. COMPARISON:  KUB 11/03/2016.  CT abdomen pelvis 10/27/2016 FINDINGS: Lower chest: Mild bibasilar atelectasis and small bilateral effusions. Cardiac enlargement.  Extensive coronary calcification. Hepatobiliary: Contracted gallbladder with multiple gallstones. No gallbladder wall thickening or biliary dilatation. 15 mm hepatic cyst on the right. Pancreas: Negative Spleen: Negative Adrenals/Urinary Tract: Negative for renal mass or obstruction. Small bilateral renal calculi. Negative urinary bladder. Stomach/Bowel: Severe colonic dilatation with multiple air-fluid levels. Transition point in the rectosigmoid compatible with sigmoid volvulus. This is the same location with a similar appearance to the CT of 10/21/2016. Rectal catheter is in place. Rectum decompressed. Layering oral contrast is seen in the colon, from recent swallowing function study. Small hiatal hernia.  Negative for small bowel dilatation. Vascular/Lymphatic: Extensive  atherosclerotic disease. No aortic aneurysm. Negative for lymphadenopathy. Reproductive: Normal prostate Other: Mild presacral soft tissue edema, increased from the prior study. Negative for abscess. Musculoskeletal: Disc degeneration and spondylosis in the lower lumbar spine. No acute skeletal abnormality. 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 This appearance is similar to the CT of 10/21/2016. Cholelithiasis Atherosclerotic disease.  Extensive coronary artery calcification Small hiatal hernia Small bilateral renal calculi without renal obstruction. Electronically Signed   By: Marlan Palau M.D.   On: 11/03/2016 08:26    Assessment/Plan Active Problems:   HTN (hypertension)   COPD (chronic obstructive pulmonary disease) (HCC)   Volvulus (HCC)   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(HCC)   Colonic obstruction (HCC)   Hypokalemia   1. Recurrent sigmoid volvulus. Status post decompression with rectal tube. Gastroenterology has been consulted. We'll also consult general surgery to see if surgical management may be indicated. 2. Hypokalemia. Replace, check magnesium 3. History of DVT and pulmonary embolus. Continue on anticoagulation with full dose Lovenox. 4. COPD . no evidence of wheezing. Continue on bronchodilators. Will discontinue steroids. 5. Hypertension. Continue on lisinopril and labetalol. 6. Dysphagia from previous stroke. Continue on dysphagia 1 and thick liquids.   DVT prophylaxis: lovenox Code Status: full code Family Communication: no family present Disposition Plan: return to SNF on discharge Consults called: gastroenterology, general surgery Admission status: inpatient, Link Snuffer MD [facility] Pager [phone]  If 7PM-7AM, please contact night-coverage [email or web address] Password [email or web address]  11/03/2016, 4:21 PM

## 2016-11-03 NOTE — ED Notes (Signed)
[facility] Phone Number   [phone] Home [phone] Cell

## 2016-11-03 NOTE — ED Notes (Signed)
Pt. Decompressed successfully

## 2016-11-04 ENCOUNTER — Encounter (HOSPITAL_COMMUNITY)
Admission: RE | Admit: 2016-11-04 | Discharge: 2016-11-04 | Disposition: A | Discharge status: Home / Self Care | Payer: Medicare Other | Source: Skilled Nursing Facility | Attending: Internal Medicine | Admitting: Internal Medicine

## 2016-11-04 DIAGNOSIS — Z993 Dependence on wheelchair: Secondary | ICD-10-CM | POA: Insufficient documentation

## 2016-11-04 DIAGNOSIS — I69328 Other speech and language deficits following cerebral infarction: Secondary | ICD-10-CM | POA: Insufficient documentation

## 2016-11-04 DIAGNOSIS — I10 Essential (primary) hypertension: Secondary | ICD-10-CM | POA: Insufficient documentation

## 2016-11-04 DIAGNOSIS — Z7901 Long term (current) use of anticoagulants: Secondary | ICD-10-CM | POA: Insufficient documentation

## 2016-11-04 DIAGNOSIS — J189 Pneumonia, unspecified organism: Secondary | ICD-10-CM | POA: Insufficient documentation

## 2016-11-04 DIAGNOSIS — K56609 Unspecified intestinal obstruction, unspecified as to partial versus complete obstruction: Secondary | ICD-10-CM

## 2016-11-04 DIAGNOSIS — G8191 Hemiplegia, unspecified affecting right dominant side: Secondary | ICD-10-CM | POA: Insufficient documentation

## 2016-11-04 DIAGNOSIS — E876 Hypokalemia: Secondary | ICD-10-CM | POA: Insufficient documentation

## 2016-11-04 LAB — CBC
HCT: 39.8 % (ref 39.0–52.0)
HEMOGLOBIN: 12.7 g/dL — AB (ref 13.0–17.0)
MCH: 29.7 pg (ref 26.0–34.0)
MCHC: 31.9 g/dL (ref 30.0–36.0)
MCV: 93 fL (ref 78.0–100.0)
Platelets: 264 10*3/uL (ref 150–400)
RBC: 4.28 MIL/uL (ref 4.22–5.81)
RDW: 13.9 % (ref 11.5–15.5)
WBC: 14 10*3/uL — ABNORMAL HIGH (ref 4.0–10.5)

## 2016-11-04 LAB — BASIC METABOLIC PANEL
ANION GAP: 5 (ref 5–15)
BUN: 12 mg/dL (ref 6–20)
CALCIUM: 7.9 mg/dL — AB (ref 8.9–10.3)
CO2: 31 mmol/L (ref 22–32)
Chloride: 107 mmol/L (ref 101–111)
Creatinine, Ser: 0.86 mg/dL (ref 0.61–1.24)
GFR calc Af Amer: >60 mL/min (ref >60)
GFR calc non Af Amer: >60 mL/min (ref >60)
GLUCOSE: 90 mg/dL (ref 65–99)
Potassium: 2.9 mmol/L — ABNORMAL LOW (ref 3.5–5.1)
Sodium: 143 mmol/L (ref 135–145)

## 2016-11-04 LAB — MAGNESIUM: MAGNESIUM: 1.6 mg/dL — AB (ref 1.7–2.4)

## 2016-11-04 MED ORDER — POTASSIUM CHLORIDE 10 MEQ/100ML IV SOLN
10.0000 meq | INTRAVENOUS | Status: AC
  Administered 2016-11-04 (×6): 10 meq via INTRAVENOUS
  Filled 2016-11-04 (×5): qty 100

## 2016-11-04 MED ORDER — MAGNESIUM SULFATE 2 GM/50ML IV SOLN
2.0000 g | Freq: Once | INTRAVENOUS | Status: AC
  Administered 2016-11-04: 2 g via INTRAVENOUS
  Filled 2016-11-04: qty 50

## 2016-11-04 MED ORDER — POTASSIUM CHLORIDE ER 10 MEQ PO TBCR
40.0000 meq | EXTENDED_RELEASE_TABLET | Freq: Two times a day (BID) | ORAL | Status: DC
  Filled 2016-11-04 (×7): qty 4

## 2016-11-04 MED ORDER — POTASSIUM CHLORIDE CRYS ER 20 MEQ PO TBCR
40.0000 meq | EXTENDED_RELEASE_TABLET | ORAL | Status: AC
Start: 2016-11-04 — End: 2016-11-04
  Administered 2016-11-04 (×3): 40 meq via ORAL
  Filled 2016-11-04 (×3): qty 2

## 2016-11-04 MED ORDER — WARFARIN - PHARMACIST DOSING INPATIENT
Status: DC
  Administered 2016-11-05: 16:00:00

## 2016-11-04 MED ORDER — WARFARIN SODIUM 2 MG PO TABS
3.0000 mg | ORAL_TABLET | Freq: Once | ORAL | Status: AC
  Administered 2016-11-04: 3 mg via ORAL
  Filled 2016-11-04: qty 1

## 2016-11-04 NOTE — Consult Note (Signed)
Reason for Consult: Recurrent sigmoid volvulus Referring Physician: Dr. Roderic Palau, Rehman  [name] [name] is an 71 y.o. male.  HPI: Patient is a 71 year old black male who I have seen in the recent past for sigmoid volvulus. He has been undergoing treatment with a rectal tube for decompression. He is having recurrent episodes of the volvulus. Patient currently has no pain. He has multiple medical problems including dysphasia, CVA, pulmonary embolism history, hemiplegia. Is on chronic anticoagulation. Patient has had multiple bowel movements.  Past Medical History:  Diagnosis Date  . Dysphasia   . Fatty liver   . Hemiplegia ([place])   . Hypertension   . Pneumonia   . Pulmonary embolism ([place])   . Stroke [facility])     Past Surgical History:  Procedure Laterality Date  . FLEXIBLE SIGMOIDOSCOPY N/A 10/21/2016   Procedure: FLEXIBLE SIGMOIDOSCOPY;  Surgeon: Rogene Houston, MD;  [facility];  Service: Endoscopy;  Laterality: N/A;  . FLEXIBLE SIGMOIDOSCOPY N/A 10/25/2016   Procedure: FLEXIBLE SIGMOIDOSCOPY;  Surgeon: Rogene Houston, MD;  [facility];  Service: Endoscopy;  Laterality: N/A;  . unable      History reviewed. No pertinent family history.  Social History:  reports that he has never smoked. He has never used smokeless tobacco. He reports that he does not drink alcohol or use drugs.  Allergies:  Allergies  Allergen Reactions  . Penicillins     Has patient had a PCN reaction causing immediate rash, facial/tongue/throat swelling, SOB or lightheadedness with hypotension: unknown Has patient had a PCN reaction causing severe rash involving mucus membranes or skin necrosis: unknown Has patient had a PCN reaction that required hospitalization: unknown Has patient had a PCN reaction occurring within the last 10 years: unknown If all of the above answers are "NO", then may proceed with Cephalosporin use. 5/3 Tolerated Cefepime x 1 dose in ED.     Medications:   Scheduled: . artificial tears   Left Eye QHS  . enoxaparin (LOVENOX) injection  1 mg/kg Subcutaneous Q12H  . ipratropium-albuterol  3 mL Nebulization TID  . labetalol  200 mg Oral Daily  . lisinopril  20 mg Oral Daily  . polyethylene glycol  17 g Oral QHS  . polyvinyl alcohol  1 drop Both Eyes BID  . potassium chloride  40 mEq Oral BID  . senna-docusate  1 tablet Oral QHS    Results for orders placed or performed during the hospital encounter of 11/03/16 (from the past 48 hour(s))  Lipase, blood     Status: None   Collection Time: 11/03/16  4:55 AM  Result Value Ref Range   Lipase 17 11 - 51 U/L  Comprehensive metabolic panel     Status: Abnormal   Collection Time: 11/03/16  4:55 AM  Result Value Ref Range   Sodium 144 135 - 145 mmol/L   Potassium 3.1 (L) 3.5 - 5.1 mmol/L   Chloride 110 101 - 111 mmol/L   CO2 29 22 - 32 mmol/L   Glucose, Bld 147 (H) 65 - 99 mg/dL   BUN 17 6 - 20 mg/dL   Creatinine, Ser 0.95 0.61 - 1.24 mg/dL   Calcium 8.3 (L) 8.9 - 10.3 mg/dL   Total Protein 6.4 (L) 6.5 - 8.1 g/dL   Albumin 2.9 (L) 3.5 - 5.0 g/dL   AST 26 15 - 41 U/L   ALT 42 17 - 63 U/L   Alkaline Phosphatase 51 38 - 126 U/L   Total Bilirubin  0.4 0.3 - 1.2 mg/dL   GFR calc non Af Amer >60 >60 mL/min   GFR calc Af Amer >60 >60 mL/min    Comment: (NOTE) The eGFR has been calculated using the CKD EPI equation. This calculation has not been validated in all clinical situations. eGFR's persistently <60 mL/min signify possible Chronic Kidney Disease.    Anion gap 5 5 - 15  CBC     Status: Abnormal   Collection Time: 11/03/16  4:55 AM  Result Value Ref Range   WBC 15.1 (H) 4.0 - 10.5 K/uL   RBC 4.53 4.22 - 5.81 MIL/uL   Hemoglobin 13.5 13.0 - 17.0 g/dL   HCT 41.5 39.0 - 52.0 %   MCV 91.6 78.0 - 100.0 fL   MCH 29.8 26.0 - 34.0 pg   MCHC 32.5 30.0 - 36.0 g/dL   RDW 13.8 11.5 - 15.5 %   Platelets 300 150 - 400 K/uL  Differential     Status: Abnormal   Collection Time: 11/03/16  4:55  AM  Result Value Ref Range   Neutrophils Relative % 77 %   Neutro Abs 11.7 (H) 1.7 - 7.7 K/uL   Lymphocytes Relative 13 %   Lymphs Abs 2.0 0.7 - 4.0 K/uL   Monocytes Relative 8 %   Monocytes Absolute 1.2 (H) 0.1 - 1.0 K/uL   Eosinophils Relative 2 %   Eosinophils Absolute 0.3 0.0 - 0.7 K/uL   Basophils Relative 0 %   Basophils Absolute 0.0 0.0 - 0.1 K/uL  Protime-INR     Status: Abnormal   Collection Time: 11/03/16  7:21 AM  Result Value Ref Range   Prothrombin Time 17.3 (H) 11.4 - 15.2 seconds   INR 1.40   Magnesium     Status: Abnormal   Collection Time: 11/04/16  4:38 AM  Result Value Ref Range   Magnesium 1.6 (L) 1.7 - 2.4 mg/dL  Basic metabolic panel     Status: Abnormal   Collection Time: 11/04/16  4:38 AM  Result Value Ref Range   Sodium 143 135 - 145 mmol/L   Potassium 2.9 (L) 3.5 - 5.1 mmol/L   Chloride 107 101 - 111 mmol/L   CO2 31 22 - 32 mmol/L   Glucose, Bld 90 65 - 99 mg/dL   BUN 12 6 - 20 mg/dL   Creatinine, Ser 0.86 0.61 - 1.24 mg/dL   Calcium 7.9 (L) 8.9 - 10.3 mg/dL   GFR calc non Af Amer >60 >60 mL/min   GFR calc Af Amer >60 >60 mL/min    Comment: (NOTE) The eGFR has been calculated using the CKD EPI equation. This calculation has not been validated in all clinical situations. eGFR's persistently <60 mL/min signify possible Chronic Kidney Disease.    Anion gap 5 5 - 15  CBC     Status: Abnormal   Collection Time: 11/04/16  4:38 AM  Result Value Ref Range   WBC 14.0 (H) 4.0 - 10.5 K/uL   RBC 4.28 4.22 - 5.81 MIL/uL   Hemoglobin 12.7 (L) 13.0 - 17.0 g/dL   HCT 39.8 39.0 - 52.0 %   MCV 93.0 78.0 - 100.0 fL   MCH 29.7 26.0 - 34.0 pg   MCHC 31.9 30.0 - 36.0 g/dL   RDW 13.9 11.5 - 15.5 %   Platelets 264 150 - 400 K/uL    Dg Abd 1 View  Result Date: 11/03/2016 CLINICAL DATA:  Abdominal bloating. EXAM: ABDOMEN - 1 VIEW COMPARISON:  CT scan  and radiographs of same day. FINDINGS: There remains severe dilatation of the sigmoid colon consistent with  sigmoid volvulus as described on prior CT scan. No small bowel dilatation is noted. Rectal tube is noted. Proximal colonic dilatation noted on prior exam appears to be significantly improved. IMPRESSION: Rectal tube is noted. Proximal colonic dilatation noted on prior exam appears to be significantly improved, although there is continued severe dilatation the sigmoid colon consistent with sigmoid volvulus as described on prior CT scan. Electronically Signed   By: Marijo Conception, M.D.   On: 11/03/2016 10:54   Dg Abd Acute W/chest  Result Date: 11/03/2016 CLINICAL DATA:  Abdominal pain for 2 days.  Abdominal distension. EXAM: DG ABDOMEN ACUTE W/ 1V CHEST COMPARISON:  Abdominal radiograph 10/28/2016, abdominal CT 10/27/2016 FINDINGS: Very low lung volumes with bibasilar atelectasis. Heart size accentuated by low lung volumes. There is tortuosity of the thoracic aorta. No large pleural effusion. Diffuse gas just distention of colon, increased colonic distention from prior exam. Sigmoid distention is similar to prior exam. No evidence of free air. No small bowel dilatation. No radiopaque calculi. No acute osseous abnormalities are seen. IMPRESSION: Progressive proximal colonic distention, significant sigmoid distention is similar. Bowel gas pattern appears similar to CT 10/27/2016. No evidence of free air. Low lung volumes with bibasilar atelectasis. Electronically Signed   By: Jeb Levering M.D.   On: 11/03/2016 06:06   Ct Renal Stone Study  Result Date: 11/03/2016 CLINICAL DATA:  Abdominal pain 2 days. Distended abdomen. History of volvulus. EXAM: CT ABDOMEN AND PELVIS WITHOUT CONTRAST TECHNIQUE: Multidetector CT imaging of the abdomen and pelvis was performed following the standard protocol without IV contrast. COMPARISON:  KUB 11/03/2016.  CT abdomen pelvis 10/27/2016 FINDINGS: Lower chest: Mild bibasilar atelectasis and small bilateral effusions. Cardiac enlargement.  Extensive coronary calcification.  Hepatobiliary: Contracted gallbladder with multiple gallstones. No gallbladder wall thickening or biliary dilatation. 15 mm hepatic cyst on the right. Pancreas: Negative Spleen: Negative Adrenals/Urinary Tract: Negative for renal mass or obstruction. Small bilateral renal calculi. Negative urinary bladder. Stomach/Bowel: Severe colonic dilatation with multiple air-fluid levels. Transition point in the rectosigmoid compatible with sigmoid volvulus. This is the same location with a similar appearance to the CT of 10/21/2016. Rectal catheter is in place. Rectum decompressed. Layering oral contrast is seen in the colon, from recent swallowing function study. Small hiatal hernia.  Negative for small bowel dilatation. Vascular/Lymphatic: Extensive atherosclerotic disease. No aortic aneurysm. Negative for lymphadenopathy. Reproductive: Normal prostate Other: Mild presacral soft tissue edema, increased from the prior study. Negative for abscess. Musculoskeletal: Disc degeneration and spondylosis in the lower lumbar spine. No acute skeletal abnormality. 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 This appearance is similar to the CT of 10/21/2016. Cholelithiasis Atherosclerotic disease.  Extensive coronary artery calcification Small hiatal hernia Small bilateral renal calculi without renal obstruction. Electronically Signed   By: Franchot Gallo M.D.   On: 11/03/2016 08:26    ROS:  Pertinent items are noted in HPI.  Blood pressure 110/75, pulse 77, temperature 98.5 F (36.9 C), temperature source Oral, resp. rate 18, height _0  (1.778 m), weight 205 lb (93 kg), SpO2 98 %. Physical Exam: Pleasant black male in no acute distress. Head is normocephalic, atraumatic. Does have a right facial droop. Lungs clear to auscultation with breath sounds bilaterally. Heart exam revealed a regular rate and rhythm with occasional dropped beats. Abdomen is soft, nontender,  nondistended. No masses are noted. Bowel sounds are present.  KUB images personally reviewed  Assessment/Plan: Recurrent sigmoid volvulus Plan: I have discussed surgical options with patient. He is at high risk for surgical intervention given his CVA, chronic anticoagulation, history of PE. Patient states that he wants no surgical intervention. He realizes that his situation could worsen and a rectal tube may not decompress him. I will follow peripherally with you.  Aviva Signs 11/04/2016, 8:32 AM

## 2016-11-04 NOTE — Progress Notes (Signed)
PROGRESS NOTE    [name] [name]  ZOX:[id number] DOB: [birth year] DOA: 11/03/2016 PCP: Mahlon Gammon, MD    Brief Narrative:  71 year old male with a history of recurrent sigmoid volvulus, presents to the emergency room with abdominal distention and pain. He was found to have recurrent volvulus which was decompressed with a rectal tube. He was seen by general surgery and refused any surgical management.   Assessment & Plan:   Active Problems:   HTN (hypertension)   COPD (chronic obstructive pulmonary disease) (HCC)   Volvulus (HCC)   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(HCC)   Colonic obstruction (HCC)   Hypokalemia   1. Recurrent sigmoid volvulus. Status post decompression with rectal tube. Gastroenterology following. Overall clinical status has improved and he is passing stool per rectal tube. He was seen by general surgery and refused any surgical intervention. Discussed with Dr. Karilyn Cota, and the patient may need nightly decompression with rectal tube at nursing facility. 2. Hypokalemia. Replace 3. History of DVT and pulmonary embolus. Continue on anticoagulation with Coumadin and Lovenox bridge 4. COPD . no evidence of wheezing. Continue on bronchodilators. Will discontinue steroids. 5. Hypertension. Continue on lisinopril and labetalol. 6. Dysphagia from previous stroke. Continue on dysphagia 1 and thick liquids. He is high risk for aspiration   DVT prophylaxis: lovenox/coumadin Code Status: full code Family Communication: no family present Disposition Plan: discharge back to SNF   Consultants:   Gastroenterology  Gen. Surgery  Procedures:    Antimicrobials:       Subjective: No abdominal pain or vomiting  Objective: Vitals:   11/04/16 0600 11/04/16 0739 11/04/16 1435 11/04/16 1500  BP: 110/75  124/68   Pulse: 77  87   Resp: 18  16   Temp: 98.5 F (36.9 C)  98.2 F (36.8 C)   TempSrc: Oral  Axillary   SpO2: 98% 98% 100% 98%  Weight:      Height:         Intake/Output Summary (Last 24 hours) at 11/04/16 1711 Last data filed at 11/04/16 1213  Gross per 24 hour  Intake             1569 ml  Output                0 ml  Net             1569 ml   Filed Weights   11/03/16 0440 11/03/16 1600  Weight: 93 kg (205 lb) 93 kg (205 lb)    Examination:  General exam: Appears calm and comfortable  Respiratory system: bilateral rhonchi. Respiratory effort normal. Cardiovascular system: S1 & S2 heard, RRR. No JVD, murmurs, rubs, gallops or clicks. No pedal edema. Gastrointestinal system: Abdomen is nondistended, soft and nontender. No organomegaly or masses felt. Normal bowel sounds heard. Extremities: no cyanosis or clubbing Skin: No rashes, lesions or ulcers Psychiatry: Judgement and insight appear normal. Mood & affect appropriate.     Data Reviewed: I have personally reviewed following labs and imaging studies  CBC:  Recent Labs Lab 10/30/16 0603 10/31/16 0638 11/01/16 0700 11/03/16 0455 11/04/16 0438  WBC 12.3* 11.8* 10.5 15.1* 14.0*  NEUTROABS  --   --  7.1 11.7*  --   HGB 12.1* 12.3* 13.2 13.5 12.7*  HCT 37.6* 38.2* 40.2 41.5 39.8  MCV 92.2 92.3 91.6 91.6 93.0  PLT 280 277 310 300 264   Basic Metabolic Panel:  Recent Labs Lab 10/29/16 0412 10/30/16 0603 10/30/16 1523 11/01/16 0700 11/02/16 0730 11/03/16 0455  11/04/16 0438  NA 147* 145  --  146* 147* 144 143  K 2.6* 2.7* 3.5 2.8* 3.3* 3.1* 2.9*  CL 115* 114*  --  110 111 110 107  CO2 28 27  --  29 30 29 31   GLUCOSE 96 104*  --  82 101* 147* 90  BUN 9 12  --  14 12 17 12   CREATININE 0.89 0.79  --  0.85 0.86 0.95 0.86  CALCIUM 7.9* 7.9*  --  8.1* 8.4* 8.3* 7.9*  MG 1.6*  --   --  1.8  --   --  1.6*   GFR: Estimated Creatinine Clearance: 91.6 mL/min (by C-G formula based on SCr of 0.86 mg/dL). Liver Function Tests:  Recent Labs Lab 11/03/16 0455  AST 26  ALT 42  ALKPHOS 51  BILITOT 0.4  PROT 6.4*  ALBUMIN 2.9*    Recent Labs Lab 11/03/16 0455   LIPASE 17   No results for input(s): AMMONIA in the last 168 hours. Coagulation Profile:  Recent Labs Lab 10/30/16 0603 10/31/16 0638 11/01/16 0700 11/02/16 0730 11/03/16 0721  INR 1.90 1.49 1.31 1.23 1.40   Cardiac Enzymes: No results for input(s): CKTOTAL, CKMB, CKMBINDEX, TROPONINI in the last 168 hours. BNP (last 3 results) No results for input(s): PROBNP in the last 8760 hours. HbA1C: No results for input(s): HGBA1C in the last 72 hours. CBG: No results for input(s): GLUCAP in the last 168 hours. Lipid Profile: No results for input(s): CHOL, HDL, LDLCALC, TRIG, CHOLHDL, LDLDIRECT in the last 72 hours. Thyroid Function Tests: No results for input(s): TSH, T4TOTAL, FREET4, T3FREE, THYROIDAB in the last 72 hours. Anemia Panel: No results for input(s): VITAMINB12, FOLATE, FERRITIN, TIBC, IRON, RETICCTPCT in the last 72 hours. Sepsis Labs: No results for input(s): PROCALCITON, LATICACIDVEN in the last 168 hours.  No results found for this or any previous visit (from the past 240 hour(s)).       Radiology Studies: Dg Abd 1 View  Result Date: 11/03/2016 CLINICAL DATA:  Abdominal bloating. EXAM: ABDOMEN - 1 VIEW COMPARISON:  CT scan and radiographs of same day. FINDINGS: There remains severe dilatation of the sigmoid colon consistent with sigmoid volvulus as described on prior CT scan. No small bowel dilatation is noted. Rectal tube is noted. Proximal colonic dilatation noted on prior exam appears to be significantly improved. IMPRESSION: Rectal tube is noted. Proximal colonic dilatation noted on prior exam appears to be significantly improved, although there is continued severe dilatation the sigmoid colon consistent with sigmoid volvulus as described on prior CT scan. Electronically Signed   By: Lupita Raider, M.D.   On: 11/03/2016 10:54   Dg Abd Acute W/chest  Result Date: 11/03/2016 CLINICAL DATA:  Abdominal pain for 2 days.  Abdominal distension. EXAM: DG ABDOMEN  ACUTE W/ 1V CHEST COMPARISON:  Abdominal radiograph 10/28/2016, abdominal CT 10/27/2016 FINDINGS: Very low lung volumes with bibasilar atelectasis. Heart size accentuated by low lung volumes. There is tortuosity of the thoracic aorta. No large pleural effusion. Diffuse gas just distention of colon, increased colonic distention from prior exam. Sigmoid distention is similar to prior exam. No evidence of free air. No small bowel dilatation. No radiopaque calculi. No acute osseous abnormalities are seen. IMPRESSION: Progressive proximal colonic distention, significant sigmoid distention is similar. Bowel gas pattern appears similar to CT 10/27/2016. No evidence of free air. Low lung volumes with bibasilar atelectasis. Electronically Signed   By: Rubye Oaks M.D.   On: 11/03/2016 06:06  Ct Renal Stone Study  Result Date: 11/03/2016 CLINICAL DATA:  Abdominal pain 2 days. Distended abdomen. History of volvulus. EXAM: CT ABDOMEN AND PELVIS WITHOUT CONTRAST TECHNIQUE: Multidetector CT imaging of the abdomen and pelvis was performed following the standard protocol without IV contrast. COMPARISON:  KUB 11/03/2016.  CT abdomen pelvis 10/27/2016 FINDINGS: Lower chest: Mild bibasilar atelectasis and small bilateral effusions. Cardiac enlargement.  Extensive coronary calcification. Hepatobiliary: Contracted gallbladder with multiple gallstones. No gallbladder wall thickening or biliary dilatation. 15 mm hepatic cyst on the right. Pancreas: Negative Spleen: Negative Adrenals/Urinary Tract: Negative for renal mass or obstruction. Small bilateral renal calculi. Negative urinary bladder. Stomach/Bowel: Severe colonic dilatation with multiple air-fluid levels. Transition point in the rectosigmoid compatible with sigmoid volvulus. This is the same location with a similar appearance to the CT of 10/21/2016. Rectal catheter is in place. Rectum decompressed. Layering oral contrast is seen in the colon, from recent swallowing  function study. Small hiatal hernia.  Negative for small bowel dilatation. Vascular/Lymphatic: Extensive atherosclerotic disease. No aortic aneurysm. Negative for lymphadenopathy. Reproductive: Normal prostate Other: Mild presacral soft tissue edema, increased from the prior study. Negative for abscess. Musculoskeletal: Disc degeneration and spondylosis in the lower lumbar spine. No acute skeletal abnormality. 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 This appearance is similar to the CT of 10/21/2016. Cholelithiasis Atherosclerotic disease.  Extensive coronary artery calcification Small hiatal hernia Small bilateral renal calculi without renal obstruction. Electronically Signed   By: Marlan Palauharles  Clark M.D.   On: 11/03/2016 08:26        Scheduled Meds: . artificial tears   Left Eye QHS  . enoxaparin (LOVENOX) injection  1 mg/kg Subcutaneous Q12H  . ipratropium-albuterol  3 mL Nebulization TID  . labetalol  200 mg Oral Daily  . lisinopril  20 mg Oral Daily  . polyvinyl alcohol  1 drop Both Eyes BID  . potassium chloride  40 mEq Oral BID  . potassium chloride  40 mEq Oral Q3H  . senna-docusate  1 tablet Oral QHS   Continuous Infusions: . magnesium sulfate 1 - 4 g bolus IVPB    . potassium chloride       LOS: 1 day    Time spent: 25mins    MEMON,JEHANZEB, MD [facility] Pager [phone]  If 7PM-7AM, please contact night-coverage [email or web address] Password [email or web address] 11/04/2016, 5:11 PM

## 2016-11-04 NOTE — Progress Notes (Signed)
ANTICOAGULATION CONSULT NOTE  Pharmacy Consult for Lovenox Indication: Hx pulmonary embolus and DVT  Allergies  Allergen Reactions  . Penicillins     Has patient had a PCN reaction causing immediate rash, facial/tongue/throat swelling, SOB or lightheadedness with hypotension: unknown Has patient had a PCN reaction causing severe rash involving mucus membranes or skin necrosis: unknown Has patient had a PCN reaction that required hospitalization: unknown Has patient had a PCN reaction occurring within the last 10 years: unknown If all of the above answers are "NO", then may proceed with Cephalosporin use. 5/3 Tolerated Cefepime x 1 dose in ED.    Patient Measurements: Height: 5\' 10"  (177.8 cm) Weight: 205 lb (93 kg) IBW/kg (Calculated) : 73  Vital Signs: Temp: 98.2 F (36.8 C) (05/17 1435) Temp Source: Axillary (05/17 1435) BP: 124/68 (05/17 1435) Pulse Rate: 87 (05/17 1435)  Labs:  Recent Labs  11/02/16 0730 11/03/16 0455 11/03/16 0721 11/04/16 0438  HGB  --  13.5  --  12.7*  HCT  --  41.5  --  39.8  PLT  --  300  --  264  LABPROT 15.6*  --  17.3*  --   INR 1.23  --  1.40  --   CREATININE 0.86 0.95  --  0.86   Estimated Creatinine Clearance: 91.6 mL/min (by C-G formula based on SCr of 0.86 mg/dL).  Medical History: Past Medical History:  Diagnosis Date  . Dysphasia   . Fatty liver   . Hemiplegia (HCC)   . Hypertension   . Pneumonia   . Pulmonary embolism (HCC)   . Stroke [facility])    Medications:  Prescriptions Prior to Admission  Medication Sig Dispense Refill Last Dose  . acetaminophen (TYLENOL) 325 MG tablet Take 650 mg by mouth every 6 (six) hours as needed for moderate pain.    Past Week at Unknown time  . betamethasone dipropionate (DIPROLENE) 0.05 % cream Apply to legs and hands. Do not apply to face groin, axilla. Try Cerave first twice a day.   11/02/2016 at Unknown time  . Eyelid Cleansers (SYSTANE LID WIPES EX) Apply 1 application topically 2  (two) times daily. Start occusoft lid scrubs: clean eyelids of oil and debris BID OU ( each eye ) before administering eye drops.    11/02/2016 at Unknown time  . ipratropium (ATROVENT) 0.02 % nebulizer solution Take 0.5 mg by nebulization every 6 (six) hours.   11/03/2016 at Unknown time  . labetalol (NORMODYNE) 200 MG tablet Take 200 mg by mouth daily. For HTN   11/02/2016 at Unknown time  . lactulose (CHRONULAC) 10 GM/15ML solution Take 20 g by mouth 2 (two) times daily.   Past Week at Unknown time  . lisinopril (PRINIVIL,ZESTRIL) 20 MG tablet Take 20 mg by mouth daily.   11/02/2016 at Unknown time  . loratadine (CLARITIN) 10 MG tablet Take 10 mg by mouth daily.   Past Week at Unknown time  . polyethylene glycol (MIRALAX / GLYCOLAX) packet Take 17 g by mouth at bedtime.   11/02/2016 at Unknown time  . polyvinyl alcohol (LIQUIFILM TEARS) 1.4 % ophthalmic solution Place 1 drop into both eyes 2 (two) times daily.   11/02/2016 at Unknown time  . potassium chloride (K-DUR) 10 MEQ tablet Take 1 tablet (10 mEq total) by mouth 3 (three) times daily. (Patient taking differently: Take 40 mEq by mouth 2 (two) times daily. ) 30 tablet 1 11/02/2016 at Unknown time  . predniSONE (DELTASONE) 20 MG tablet Take 40 mg  by mouth daily with breakfast.   11/02/2016 at Unknown time  . sennosides-docusate sodium (SENOKOT-S) 8.6-50 MG tablet Take 1 tablet by mouth at bedtime. For constipation   11/02/2016 at Unknown time  . warfarin (COUMADIN) 3 MG tablet Take 3 mg by mouth daily.   11/02/2016 at 1700  . White Petrolatum-Mineral Oil (SYSTANE NIGHTTIME) OINT Place 1 application into the left eye at bedtime. Apply to left eye at night    11/02/2016 at Unknown time  . Emollient (CERAVE) CREA Apply 1 application topically daily as needed for itching once a day   unknown   Assessment: Okay for Protocol, INR below goal. Lovenox bridge until potential surgical plans finalized.  Home Coumadin dose reportedly 3mg  daily.  Goal of Therapy:   Systemic Anticoagulation.  Monitor platelets by anticoagulation protocol: Yes   Plan:  Lovenox 1mg /kg SQ every 12 hours until INR at goal Coumadin 3mg  today x 1 INR daily Monitor for signs and symptoms of bleeding.  CBC every 48 hours.  Valrie HartHall, Juline Sanderford A 11/04/2016,5:20 PM

## 2016-11-04 NOTE — Clinical Social Work Note (Signed)
Clinical Social Work Assessment  Patient Details  Name: [place] Des[place]is[place] T Schiavi MRN: [id number] Date of Birth: 1946/01/18  Date of referral:  11/04/16               Reason for consult:  Discharge Planning                Permission sought to share information with:    Permission granted to share information::     Name::        Agency::  [facility]  Relationship::     Contact Information:     Housing/Transportation Living arrangements for the past 2 months:  [facility] Source of Information:  Facility Patient Interpreter Needed:  None Criminal Activity/Legal Involvement Pertinent to Current Situation/Hospitalization:  No - Comment as needed Significant Relationships:  Siblings Lives with:  Facility Resident Do you feel safe going back to the place where you live?  Yes Need for family participation in patient care:  Yes (Comment)  Care giving concerns:  None identified.    Social Worker assessment / plan:  [facility]  His brother is supportive and visits. Patient receives assistance with ADLs from staff. He is able to feed himself. He uses a wheelchair and is able to propel himself around the facility.  Patient can return.   Employment status:  Retired Health and safety inspectornsurance information:  Medicare PT Recommendations:  Not assessed at this time (Assessment pending) Information / Referral to community resources:     Patient/Family's Response to care:  Patient agreeble to return.   Patient/Family's Understanding of and Emotional Response to Diagnosis, Current Treatment, and Prognosis:  Patient understands diagnosis, treatment and prognosis.   Emotional Assessment Appearance:  Appears stated age Attitude/Demeanor/Rapport:    Affect (typically observed):  Calm Orientation:  Oriented to Self, Oriented to Place Alcohol / Substance use:  Not Applicable Psych involvement (Current and /or in the community):  No (Comment)  Discharge Needs   Concerns to be addressed:  Discharge Planning Concerns Readmission within the last 30 days:  Yes Current discharge risk:  None Barriers to Discharge:  No Barriers Identified   Annice NeedySettle, Julionna Marczak D, LCSW 11/04/2016, 1:29 PM

## 2016-11-04 NOTE — Progress Notes (Signed)
  Subjective:  Patient has no complaints. He states he ate all of his breakfast and lunch. He denies abdominal pain nausea or vomiting.  Objective: Blood pressure 124/68, pulse 87, temperature 98.2 F (36.8 C), temperature source Axillary, resp. rate 16, height 5\' 10"  (1.778 m), weight 205 lb (93 kg), SpO2 100 %. Patient is upright and reclining chair and appears to be comfortable. Abdomen is full with normal bowel sounds and soft on palpation. Lower extremity edema remains unchanged.  Labs/studies Results:   Recent Labs  11/03/16 0455 11/04/16 0438  WBC 15.1* 14.0*  HGB 13.5 12.7*  HCT 41.5 39.8  PLT 300 264    BMET   Recent Labs  11/02/16 0730 11/03/16 0455 11/04/16 0438  NA 147* 144 143  K 3.3* 3.1* 2.9*  CL 111 110 107  CO2 30 29 31   GLUCOSE 101* 147* 90  BUN 12 17 12   CREATININE 0.86 0.95 0.86  CALCIUM 8.4* 8.3* 7.9*    LFT   Recent Labs  11/03/16 0455  PROT 6.4*  ALBUMIN 2.9*  AST 26  ALT 42  ALKPHOS 51  BILITOT 0.4    PT/INR   Recent Labs  11/02/16 0730 11/03/16 0721  LABPROT 15.6* 17.3*  INR 1.23 1.40     Assessment:  #1. Sigmoid volvulus. It appears he has been effectively decompressed with rectal tube. He is still passing liquid stool and therefore polyethylene glycol can be held for a day or 2. I noted patient has been on lactulose even though it was recommended that this medicine should not be used as it is likely to increase risk of wall bolus volvulus. Since patient has elected not to undergo surgery he may have to be treated with intermittent or daily rectal tube for 1-2 hours.  #2. Hypokalemia. Patient is receiving KCl and IV fluids. Hypokalemia is corrected slowly because of GI losses.   Recommendations:  Lactulose has been discontinued. Hold MiraLAX for now. Will remove rectal tube in stool becomes semi-formed.

## 2016-11-04 NOTE — Care Management Note (Signed)
Case Management Note  Patient Details  Name: [name] [name] MRN: [id number] Date of Birth: June 07, 1946  Subjective/Objective:  Ad[facility] sigm[facility] and GI consults pending. [facility]                   Action/Plan: CSW consulted to make arrangements for return to SNF .   Expected Discharge Date:      11/05/2016            Expected Discharge Plan:  Skilled Nursing Facility  In-House Referral:  Clinical Social Work  Discharge planning Services  CM Consult  Post Acute Care Choice:    Choice offered to:     DME Arranged:    DME Agency:     HH Arranged:    HH Agency:     Status of Service:  In process, will continue to follow  If discussed at Long Length of Stay Meetings, dates discussed:    Additional Comments:  Lane Kjos, Chrystine OilerSharley Diane, RN 11/04/2016, 8:57 AM

## 2016-11-04 NOTE — Evaluation (Signed)
Physical Therapy Evaluation Patient Details Name: ANNA LIVERS MRN: [id number] DOB: 29-Jan-1946 Today's Date: 11/04/2016   History of Present Illness  71 year old black male who I have seen in the recent past for sigmoid volvulus. He has been undergoing treatment with a rectal tube for decompression. He is having recurrent episodes of the volvulus. Patient currently has no pain. He has multiple medical problems including dysphasia, CVA, pulmonary embolism history, Dense R hemiplegia. Is on chronic anticoagulation. Patient has had multiple bowel movements.  Imaging indicated recurrent colonic distention volvulus.  Rectal tube was placed and patient had passage of flatus and stool. Symptoms had improved.  He is not a candidate for surgery due to multiple co-morbidities.      Clinical Impression  Pt received in bed, and was agreeable to PT evaluation.  Pt has been at the [facility] and states that he uses a lift to stand and get into the w/c, and he then propel's the w/c on his own.  During today's PT evaluation, he required Max A for supine<>sit and demonstrated poor static sitting balance on the EOB.  Therefore, Maxi move utilized to transfer pt bed<>chair.  Pt is recommended to return to [facility] to continue to progress with strength and mobility .      Follow Up Recommendations SNF    Equipment Recommendations  None recommended by PT    Recommendations for Other Services       Precautions / Restrictions Precautions Precaution Comments: due to immobility Restrictions Weight Bearing Restrictions: No      Mobility  Bed Mobility Overal bed mobility: Needs Assistance Bed Mobility: Rolling Rolling: Min assist (Min A going to the right, and Max A going to the left. )   Supine to sit: Mod assist;HOB elevated (increased time with use of bed pad to assist hips to the EOB. ) Sit to supine: Total assist   General bed mobility comments: Pt demonstrates unsteadiness while sitting on  the EOB.  He requires constant assistance due to LOB either direction.  Due to this unsteadiness, he is not safe to attempt SPT to the chair, and therefore, he was returned to supine, and Maxi move utilized for transfer.   Transfers Overall transfer level: Needs assistance                  Ambulation/Gait                Stairs            Wheelchair Mobility    Modified Rankin (Stroke Patients Only)       Balance Overall balance assessment: Needs assistance Sitting-balance support: Single extremity supported;Feet supported Sitting balance-Leahy Scale: Poor Sitting balance - Comments: Pt is very unsteady, and quickly looses balance in any direction with limited righting reaction.                                       Pertinent Vitals/Pain Pain Assessment: 0-10 Pain Location: Abdominal discomfort    Home Living     Available Help at Discharge: Skilled Nursing Facility           Home Equipment: Wheelchair - manual      Prior Function     Gait / Transfers Assistance Needed: Pt mobilizes in the w/c using both UE and LE.  Pt reports that the staff have been using a lift to stand and assist him  into the w/c.   ADL's / Homemaking Assistance Needed: assistance for dressing and bathing.         Hand Dominance        Extremity/Trunk Assessment   Upper Extremity Assessment Upper Extremity Assessment: Generalized weakness RUE Deficits / Details: Dense R hemiparesis LUE Deficits / Details: Ataxic    Lower Extremity Assessment Lower Extremity Assessment: RLE deficits/detail;Generalized weakness RLE Deficits / Details: hemiparesis with foot drop LLE Deficits / Details: Ataxic       Communication   Communication: Other (comment) (dysphagia)  Cognition Arousal/Alertness: Awake/alert Behavior During Therapy: WFL for tasks assessed/performed Overall Cognitive Status: Within Functional Limits for tasks assessed                                         General Comments      Exercises     Assessment/Plan    PT Assessment Patient needs continued PT services  PT Problem List Decreased strength;Decreased range of motion;Decreased activity tolerance;Decreased balance;Decreased mobility;Decreased coordination;Decreased knowledge of use of DME;Decreased safety awareness;Decreased knowledge of precautions       PT Treatment Interventions DME instruction;Gait training;Functional mobility training;Therapeutic activities;Therapeutic exercise;Balance training;Patient/family education    PT Goals (Current goals can be found in the Care Plan section)  Acute Rehab PT Goals Patient Stated Goal: To go back to [facility] PT Goal Formulation: With patient/family Time For Goal Achievement: 11/05/16 Potential to Achieve Goals: Fair    Frequency Min 3X/week   Barriers to discharge        Co-evaluation               AM-PAC PT "6 Clicks" Daily Activity  Outcome Measure Difficulty turning over in bed (including adjusting bedclothes, sheets and blankets)?: A Lot Difficulty moving from lying on back to sitting on the side of the bed? : A Lot Difficulty sitting down on and standing up from a chair with arms (e.g., wheelchair, bedside commode, etc,.)?: Total Help needed moving to and from a bed to chair (including a wheelchair)?: Total Help needed walking in hospital room?: Total Help needed climbing 3-5 steps with a railing? : Total 6 Click Score: 8    End of Session Equipment Utilized During Treatment: Oxygen (rectal foley catheter) Activity Tolerance: Patient tolerated treatment well Patient left: in chair;with call bell/phone within reach Nurse Communication: Mobility status;Need for lift equipment Leotis Shames(Lauren, RN notified of pt's mobility status. Use Maxi move for transfer. ) PT Visit Diagnosis: Muscle weakness (generalized) (M62.81);Other abnormalities of gait and mobility (R26.89);History of falling  (Z91.81)    Time: [id number] PT Time Calculation (min) (ACUTE ONLY): 46 min   Charges:   PT Evaluation $PT Eval Low Complexity: 1 Procedure PT Treatments $Therapeutic Activity: 23-37 mins   PT G Codes:   PT G-Codes **NOT FOR INPATIENT CLASS** Functional Assessment Tool Used: AM-PAC 6 Clicks Basic Mobility Functional Limitation: Mobility: Walking and moving around Mobility: Walking and Moving Around Current Status ([id number](G8978): At least 60 percent but less than 80 percent impaired, limited or restricted Mobility: Walking and Moving Around Goal Status [phone](G8979): At least 40 percent but less than 60 percent impaired, limited or restricted    Beth Caidan Hubbert, PT, DPT X: [phone]

## 2016-11-05 ENCOUNTER — Inpatient Hospital Stay
Admission: RE | Admit: 2016-11-05 | Discharge: 2019-05-09 | Disposition: A | Discharge status: Nursing Home (Includes ICF) | Payer: Medicare Other | Source: Ambulatory Visit | Attending: Internal Medicine | Admitting: Internal Medicine

## 2016-11-05 ENCOUNTER — Inpatient Hospital Stay (HOSPITAL_COMMUNITY): Payer: Medicare Other

## 2016-11-05 DIAGNOSIS — H6123 Impacted cerumen, bilateral: Secondary | ICD-10-CM | POA: Diagnosis not present

## 2016-11-05 DIAGNOSIS — R293 Abnormal posture: Secondary | ICD-10-CM | POA: Diagnosis not present

## 2016-11-05 DIAGNOSIS — J441 Chronic obstructive pulmonary disease with (acute) exacerbation: Secondary | ICD-10-CM | POA: Diagnosis not present

## 2016-11-05 DIAGNOSIS — I10 Essential (primary) hypertension: Secondary | ICD-10-CM | POA: Diagnosis not present

## 2016-11-05 DIAGNOSIS — G8191 Hemiplegia, unspecified affecting right dominant side: Secondary | ICD-10-CM | POA: Diagnosis not present

## 2016-11-05 DIAGNOSIS — I639 Cerebral infarction, unspecified: Secondary | ICD-10-CM | POA: Diagnosis not present

## 2016-11-05 DIAGNOSIS — I2782 Chronic pulmonary embolism: Secondary | ICD-10-CM | POA: Diagnosis not present

## 2016-11-05 DIAGNOSIS — M6281 Muscle weakness (generalized): Secondary | ICD-10-CM | POA: Diagnosis not present

## 2016-11-05 DIAGNOSIS — Z5181 Encounter for therapeutic drug level monitoring: Secondary | ICD-10-CM | POA: Diagnosis not present

## 2016-11-05 DIAGNOSIS — Z993 Dependence on wheelchair: Secondary | ICD-10-CM | POA: Diagnosis not present

## 2016-11-05 DIAGNOSIS — R1312 Dysphagia, oropharyngeal phase: Secondary | ICD-10-CM | POA: Diagnosis not present

## 2016-11-05 DIAGNOSIS — L89612 Pressure ulcer of right heel, stage 2: Secondary | ICD-10-CM | POA: Diagnosis not present

## 2016-11-05 DIAGNOSIS — E876 Hypokalemia: Secondary | ICD-10-CM | POA: Diagnosis not present

## 2016-11-05 DIAGNOSIS — J449 Chronic obstructive pulmonary disease, unspecified: Secondary | ICD-10-CM | POA: Diagnosis not present

## 2016-11-05 DIAGNOSIS — R279 Unspecified lack of coordination: Secondary | ICD-10-CM | POA: Diagnosis not present

## 2016-11-05 DIAGNOSIS — R059 Cough, unspecified: Principal | ICD-10-CM

## 2016-11-05 DIAGNOSIS — K56609 Unspecified intestinal obstruction, unspecified as to partial versus complete obstruction: Secondary | ICD-10-CM | POA: Diagnosis not present

## 2016-11-05 DIAGNOSIS — J189 Pneumonia, unspecified organism: Secondary | ICD-10-CM | POA: Diagnosis not present

## 2016-11-05 DIAGNOSIS — I69328 Other speech and language deficits following cerebral infarction: Secondary | ICD-10-CM | POA: Diagnosis not present

## 2016-11-05 DIAGNOSIS — R197 Diarrhea, unspecified: Secondary | ICD-10-CM | POA: Diagnosis not present

## 2016-11-05 DIAGNOSIS — I638 Other cerebral infarction: Secondary | ICD-10-CM | POA: Diagnosis not present

## 2016-11-05 DIAGNOSIS — K562 Volvulus: Secondary | ICD-10-CM | POA: Diagnosis not present

## 2016-11-05 DIAGNOSIS — R05 Cough: Principal | ICD-10-CM

## 2016-11-05 DIAGNOSIS — Z7901 Long term (current) use of anticoagulants: Secondary | ICD-10-CM | POA: Diagnosis not present

## 2016-11-05 LAB — POTASSIUM: POTASSIUM: 3.2 mmol/L — AB (ref 3.5–5.1)

## 2016-11-05 LAB — BASIC METABOLIC PANEL
ANION GAP: 5 (ref 5–15)
BUN: 11 mg/dL (ref 6–20)
CALCIUM: 7.6 mg/dL — AB (ref 8.9–10.3)
CO2: 28 mmol/L (ref 22–32)
Chloride: 108 mmol/L (ref 101–111)
Creatinine, Ser: 0.89 mg/dL (ref 0.61–1.24)
Glucose, Bld: 83 mg/dL (ref 65–99)
POTASSIUM: 2.9 mmol/L — AB (ref 3.5–5.1)
Sodium: 141 mmol/L (ref 135–145)

## 2016-11-05 LAB — CBC
HEMATOCRIT: 35.7 % — AB (ref 39.0–52.0)
Hemoglobin: 11.8 g/dL — ABNORMAL LOW (ref 13.0–17.0)
MCH: 30.3 pg (ref 26.0–34.0)
MCHC: 33.1 g/dL (ref 30.0–36.0)
MCV: 91.5 fL (ref 78.0–100.0)
Platelets: 266 10*3/uL (ref 150–400)
RBC: 3.9 MIL/uL — AB (ref 4.22–5.81)
RDW: 14 % (ref 11.5–15.5)
WBC: 9.9 10*3/uL (ref 4.0–10.5)

## 2016-11-05 LAB — PROTIME-INR
INR: 1.59
Prothrombin Time: 19.1 seconds — ABNORMAL HIGH (ref 11.4–15.2)

## 2016-11-05 MED ORDER — WARFARIN SODIUM 2 MG PO TABS
3.0000 mg | ORAL_TABLET | Freq: Once | ORAL | Status: AC
  Administered 2016-11-05: 18:00:00 3 mg via ORAL
  Filled 2016-11-05: qty 1

## 2016-11-05 MED ORDER — POTASSIUM CHLORIDE CRYS ER 20 MEQ PO TBCR
40.0000 meq | EXTENDED_RELEASE_TABLET | ORAL | Status: AC
  Administered 2016-11-05 (×2): 40 meq via ORAL
  Filled 2016-11-05 (×2): qty 2

## 2016-11-05 MED ORDER — POTASSIUM CHLORIDE CRYS ER 20 MEQ PO TBCR
40.0000 meq | EXTENDED_RELEASE_TABLET | Freq: Two times a day (BID) | ORAL | Status: DC
  Filled 2016-11-05 (×6): qty 4

## 2016-11-05 MED ORDER — POTASSIUM CHLORIDE ER 10 MEQ PO TBCR: 40.0000 meq | EXTENDED_RELEASE_TABLET | Freq: Two times a day (BID) | ORAL | Status: DC

## 2016-11-05 MED ORDER — POTASSIUM CHLORIDE 10 MEQ/100ML IV SOLN
10.0000 meq | INTRAVENOUS | Status: AC
  Administered 2016-11-05 (×6): 10 meq via INTRAVENOUS
  Filled 2016-11-05: qty 100

## 2016-11-05 MED ORDER — ENOXAPARIN SODIUM 100 MG/ML ~~LOC~~ SOLN: 1.0000 mg/kg | Freq: Two times a day (BID) | SUBCUTANEOUS | Status: DC

## 2016-11-05 NOTE — Progress Notes (Signed)
ANTICOAGULATION CONSULT NOTE - follow up  Pharmacy Consult for Lovenox >> Warfarin (home med) Indication: Hx pulmonary embolus and DVT  Allergies  Allergen Reactions  . Penicillins     Has patient had a PCN reaction causing immediate rash, facial/tongue/throat swelling, SOB or lightheadedness with hypotension: unknown Has patient had a PCN reaction causing severe rash involving mucus membranes or skin necrosis: unknown Has patient had a PCN reaction that required hospitalization: unknown Has patient had a PCN reaction occurring within the last 10 years: unknown If all of the above answers are "NO", then may proceed with Cephalosporin use. 5/3 Tolerated Cefepime x 1 dose in ED.    Patient Measurements: Height: 5\' 10"  (177.8 cm) Weight: 205 lb (93 kg) IBW/kg (Calculated) : 73  Vital Signs: Temp: 97.5 F (36.4 C) (05/18 0500) Temp Source: Oral (05/18 0500) BP: 117/67 (05/18 0500) Pulse Rate: 66 (05/18 0500)  Labs:  Recent Labs  11/03/16 0455 11/03/16 0721 11/04/16 0438 11/05/16 0416  HGB 13.5  --  12.7* 11.8*  HCT 41.5  --  39.8 35.7*  PLT 300  --  264 266  LABPROT  --  17.3*  --  19.1*  INR  --  1.40  --  1.59  CREATININE 0.95  --  0.86 0.89   Estimated Creatinine Clearance: 88.5 mL/min (by C-G formula based on SCr of 0.89 mg/dL).  Medical History: Past Medical History:  Diagnosis Date  . Dysphasia   . Fatty liver   . Hemiplegia (HCC)   . Hypertension   . Pneumonia   . Pulmonary embolism (HCC)   . Stroke [facility])    Medications:  Prescriptions Prior to Admission  Medication Sig Dispense Refill Last Dose  . acetaminophen (TYLENOL) 325 MG tablet Take 650 mg by mouth every 6 (six) hours as needed for moderate pain.    Past Week at Unknown time  . betamethasone dipropionate (DIPROLENE) 0.05 % cream Apply to legs and hands. Do not apply to face groin, axilla. Try Cerave first twice a day.   11/02/2016 at Unknown time  . Eyelid Cleansers (SYSTANE LID WIPES EX) Apply 1  application topically 2 (two) times daily. Start occusoft lid scrubs: clean eyelids of oil and debris BID OU ( each eye ) before administering eye drops.    11/02/2016 at Unknown time  . ipratropium (ATROVENT) 0.02 % nebulizer solution Take 0.5 mg by nebulization every 6 (six) hours.   11/03/2016 at Unknown time  . labetalol (NORMODYNE) 200 MG tablet Take 200 mg by mouth daily. For HTN   11/02/2016 at Unknown time  . lactulose (CHRONULAC) 10 GM/15ML solution Take 20 g by mouth 2 (two) times daily.   Past Week at Unknown time  . lisinopril (PRINIVIL,ZESTRIL) 20 MG tablet Take 20 mg by mouth daily.   11/02/2016 at Unknown time  . loratadine (CLARITIN) 10 MG tablet Take 10 mg by mouth daily.   Past Week at Unknown time  . polyethylene glycol (MIRALAX / GLYCOLAX) packet Take 17 g by mouth at bedtime.   11/02/2016 at Unknown time  . polyvinyl alcohol (LIQUIFILM TEARS) 1.4 % ophthalmic solution Place 1 drop into both eyes 2 (two) times daily.   11/02/2016 at Unknown time  . potassium chloride (K-DUR) 10 MEQ tablet Take 1 tablet (10 mEq total) by mouth 3 (three) times daily. (Patient taking differently: Take 40 mEq by mouth 2 (two) times daily. ) 30 tablet 1 11/02/2016 at Unknown time  . predniSONE (DELTASONE) 20 MG tablet Take 40  mg by mouth daily with breakfast.   11/02/2016 at Unknown time  . sennosides-docusate sodium (SENOKOT-S) 8.6-50 MG tablet Take 1 tablet by mouth at bedtime. For constipation   11/02/2016 at Unknown time  . warfarin (COUMADIN) 3 MG tablet Take 3 mg by mouth daily.   11/02/2016 at 1700  . White Petrolatum-Mineral Oil (SYSTANE NIGHTTIME) OINT Place 1 application into the left eye at bedtime. Apply to left eye at night    11/02/2016 at Unknown time  . Emollient (CERAVE) CREA Apply 1 application topically daily as needed for itching once a day   unknown   Assessment: Okay for Protocol, INR below goal but now trending up.  Lovenox bridge until INR at goal.  Home Coumadin dose reportedly 3mg   daily.  No bleeding reported.  CBC appears stable.    Goal of Therapy:  INR 2-3 Monitor platelets by anticoagulation protocol: Yes   Plan:  Lovenox 1mg /kg SQ every 12 hours until INR at goal Coumadin 3mg  today x 1 INR daily Monitor for signs and symptoms of bleeding.  CBC every 48 hours.  Margo AyeHall, Zaynah Chawla A 11/05/2016,7:51 AM

## 2016-11-05 NOTE — Progress Notes (Signed)
MD notified of potassium level 2.9 via text.

## 2016-11-05 NOTE — Progress Notes (Signed)
Physical Therapy Treatment Patient Details Name: Rosedale DesanctisScoville T Calles MRN: [id number] DOB: [birth year] Today's Date: 11/05/2016    History of Present Illness 71 year old black male who I have seen in the recent past for sigmoid volvulus. He has been undergoing treatment with a rectal tube for decompression. He is having recurrent episodes of the volvulus. Patient currently has no pain. He has multiple medical problems including dysphasia, CVA, pulmonary embolism history, Dense R hemiplegia. Is on chronic anticoagulation. Patient has had multiple bowel movements.  Imaging indicated recurrent colonic distention volvulus.  Rectal tube was placed and patient had passage of flatus and stool. Symptoms had improved.  He is not a candidate for surgery due to multiple co-morbidities.      PT Comments    Worked on bed mobility today, however pt continues to require Min/Mod A for rolling each direction due to previous hemiparesis.  No w/c available today to practice w/c mobility, therefore Maxi move utilized to assist pt with t/f into the chair.  Continue to recommend SNF.   Follow Up Recommendations  SNF     Equipment Recommendations  None recommended by PT    Recommendations for Other Services       Precautions / Restrictions Precautions Precautions: Fall Precaution Comments: due to immobility Restrictions Weight Bearing Restrictions: No    Mobility  Bed Mobility Overal bed mobility: Needs Assistance Bed Mobility: Rolling Rolling: Min assist;Mod assist            Transfers Overall transfer level: Needs assistance                  Ambulation/Gait                 Stairs            Wheelchair Mobility    Modified Rankin (Stroke Patients Only)       Balance                                            Cognition Arousal/Alertness: Awake/alert Behavior During Therapy: WFL for tasks assessed/performed Overall Cognitive Status: Within  Functional Limits for tasks assessed                                        Exercises      General Comments        Pertinent Vitals/Pain Pain Assessment: No/denies pain    Home Living                      Prior Function            PT Goals (current goals can now be found in the care plan section) Acute Rehab PT Goals Patient Stated Goal: [facility] to [facility] PT Goal Formulation: With patient/family Time For Goal Achievement: 11/05/16 Potential to Achieve Goals: Fair Progress towards PT goals: Not progressing toward goals - comment    Frequency    Min 3X/week      PT Plan Current plan remains appropriate    Co-evaluation              AM-PAC PT "6 Clicks" Daily Activity  Outcome Measure  Difficulty turning over in bed (including adjusting bedclothes, sheets and blankets)?: A Lot Difficulty moving from lying on  back to sitting on the side of the bed? : A Lot Difficulty sitting down on and standing up from a chair with arms (e.g., wheelchair, bedside commode, etc,.)?: Total Help needed moving to and from a bed to chair (including a wheelchair)?: Total Help needed walking in hospital room?: Total Help needed climbing 3-5 steps with a railing? : Total 6 Click Score: 8    End of Session Equipment Utilized During Treatment: Oxygen Activity Tolerance: Patient tolerated treatment well Patient left: in chair;with call bell/phone within reach Nurse Communication: Mobility status;Need for lift equipment PT Visit Diagnosis: Muscle weakness (generalized) (M62.81);Other abnormalities of gait and mobility (R26.89);History of falling (Z91.81)     Time: 1610-9604 PT Time Calculation (min) (ACUTE ONLY): 31 min  Charges:  $Therapeutic Activity: 8-22 mins                    G Codes:       Beth Morrison Mcbryar, PT, DPT X: [phone]

## 2016-11-05 NOTE — Progress Notes (Signed)
  Subjective:  Patient has no complaints. He denies abdominal pain. He states he has very good appetite and ate all of his breakfast.  Objective: Blood pressure 117/67, pulse 66, temperature 97.5 F (36.4 C), temperature source Oral, resp. rate 18, height 5\' 10"  (1.778 m), weight 205 lb (93 kg), SpO2 93 %. He is alert and appears to be comfortable. He has dysarthria and his speech is difficult to comprehend. Abdomen is full and not distended or tense anymore. Bowel sounds are normal. On palpation abdomen is soft and nontender without organomegaly or masses. Rectal tube has been removed.  Labs/studies Results:   Recent Labs  11/03/16 0455 11/04/16 0438 11/05/16 0416  WBC 15.1* 14.0* 9.9  HGB 13.5 12.7* 11.8*  HCT 41.5 39.8 35.7*  PLT 300 264 266    BMET   Recent Labs  11/03/16 0455 11/04/16 0438 11/05/16 0416  NA 144 143 141  K 3.1* 2.9* 2.9*  CL 110 107 108  CO2 29 31 28   GLUCOSE 147* 90 83  BUN 17 12 11   CREATININE 0.95 0.86 0.89  CALCIUM 8.3* 7.9* 7.6*    LFT   Recent Labs  11/03/16 0455  PROT 6.4*  ALBUMIN 2.9*  AST 26  ALT 42  ALKPHOS 51  BILITOT 0.4    PT/INR   Recent Labs  11/03/16 0721 11/05/16 0416  LABPROT 17.3* 19.1*  INR 1.40 1.59     Assessment:  #1. Sigmoid volvulus. Abdominal exam is normal. Because of redundant sigmoid colon and he is at risk for recurrent volvulus. A rectal tube every night for a week or so might help. He should not be given lactulose as it will cause flatulence and increased risk of recurrent volvulus. He can go back on polyethylene glycol starting tomorrow.  #2. Hypokalemia.  Serum potassium remains low. He is getting IV potassium. He must of lost potassium via GI tract because of diarrhea.   Recommendations:  Resume polyethylene glycol at 17 g daily starting on 11/06/2016. Can titrate dose. Past rectal tube every evening for 1 hour for 7 days and then after as needed(18 French red rubber catheter). Patient  should be assisted to bedside commode twice daily and Colace to pass stool and flatus.

## 2016-11-05 NOTE — Clinical Social Work Note (Signed)
L[facility]. LCSW  Sent clinicals via epic hub.    LCSW advised attempted contact with patient's brother listed on chart and received a fast busy signal with each number.   LCSW signing off.       Tarshia Kot, Juleen ChinaHeather D, LCSW

## 2016-11-05 NOTE — Progress Notes (Signed)
[facility] given [facility]. Voiced to her that I would administer the patients coumadin and one more dose of potassium before I bring him over.  She verbalized understanding.  Patient is ready be transferred. He is in stable condition.

## 2016-11-05 NOTE — Discharge Summary (Signed)
Physician Discharge Summary  [name] [name] ZOX:[id number] DOB: 04/27/1946 DOA: 11/03/2016  PCP: Mahlon Gammon, MD  Admit date: 11/03/2016 Discharge date: 11/05/2016  Admitted From: SNF Disposition:  SNF  Recommendations for Outpatient Follow-up:  1. Follow up with PCP in 1-2 weeks 2. Please obtain BMP/CBC in one week, follow up potassium 3. Insert rectal tube (18 French red rubber catheter) every evening for 1 hour for 7 days and then after as needed 4. Discontinue lovenox once INR is therapeutic   Discharge Condition: stable CODE STATUS: full code Diet recommendation: Dysphagia 1 with honey thick liquids  Brief/Interim Summary: 71 year old male with a history of recurrent sigmoid volvulus, presents to the emergency room with abdominal distention and pain. He was found to have recurrent volvulus which was decompressed with a rectal tube. He was seen by general surgery and refused any surgical management.  Discharge Diagnoses:  Active Problems:   HTN (hypertension)   COPD (chronic obstructive pulmonary disease) (HCC)   Volvulus (HCC)   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(HCC)   Colonic obstruction (HCC)   Hypokalemia  1. Recurrent sigmoid volvulus. Status post decompression with rectal tube. Gastroenterology following. Overall clinical status has improved and he is passing stool. He was seen by general surgery and refused any surgical intervention. Discussed with Dr. Karilyn Cota, and the patient will need nightly decompression with rectal tube at nursing facility. Continue on daily miralax, which can be titrated for daily bowel moveements 2. Hypokalemia. Replaced, continue to follow at SNF 3. History of DVT and pulmonary embolus. Continue on anticoagulation with Coumadin and Lovenox bridge. Discontinue lovenox once INR is therapetuic 4. COPD . no evidence of wheezing. Continue on bronchodilators.  5. Hypertension. Continue on lisinopril and labetalol. 6. Dysphagia from previous stroke. Continue on  dysphagia 1 and thick liquids. He is high risk for aspiration  Discharge Instructions  Discharge Instructions    Diet - low sodium heart healthy    Complete by:  As directed    Increase activity slowly    Complete by:  As directed      Allergies as of 11/05/2016      Reactions   Penicillins    Has patient had a PCN reaction causing immediate rash, facial/tongue/throat swelling, SOB or lightheadedness with hypotension: unknown Has patient had a PCN reaction causing severe rash involving mucus membranes or skin necrosis: unknown Has patient had a PCN reaction that required hospitalization: unknown Has patient had a PCN reaction occurring within the last 10 years: unknown If all of the above answers are "NO", then may proceed with Cephalosporin use. 5/3 Tolerated Cefepime x 1 dose in ED.      Medication List    STOP taking these medications   lactulose 10 GM/15ML solution Commonly known as:  CHRONULAC   predniSONE 20 MG tablet Commonly known as:  DELTASONE     TAKE these medications   acetaminophen 325 MG tablet Commonly known as:  TYLENOL Take 650 mg by mouth every 6 (six) hours as needed for moderate pain.   betamethasone dipropionate 0.05 % cream Commonly known as:  DIPROLENE Apply to legs and hands. Do not apply to face groin, axilla. Try Cerave first twice a day.   CERAVE Crea Apply 1 application topically daily as needed for itching once a day   enoxaparin 100 MG/ML injection Commonly known as:  LOVENOX Inject 0.95 mLs (95 mg total) into the skin every 12 (twelve) hours.   ipratropium 0.02 % nebulizer solution Commonly known as:  ATROVENT Take  0.5 mg by nebulization every 6 (six) hours.   labetalol 200 MG tablet Commonly known as:  NORMODYNE Take 200 mg by mouth daily. For HTN   lisinopril 20 MG tablet Commonly known as:  PRINIVIL,ZESTRIL Take 20 mg by mouth daily.   loratadine 10 MG tablet Commonly known as:  CLARITIN Take 10 mg by mouth daily.    polyethylene glycol packet Commonly known as:  MIRALAX / GLYCOLAX Take 17 g by mouth at bedtime.   polyvinyl alcohol 1.4 % ophthalmic solution Commonly known as:  LIQUIFILM TEARS Place 1 drop into both eyes 2 (two) times daily.   potassium chloride 10 MEQ tablet Commonly known as:  K-DUR Take 4 tablets (40 mEq total) by mouth 2 (two) times daily.   sennosides-docusate sodium 8.6-50 MG tablet Commonly known as:  SENOKOT-S Take 1 tablet by mouth at bedtime. For constipation   SYSTANE LID WIPES EX Apply 1 application topically 2 (two) times daily. Start occusoft lid scrubs: clean eyelids of oil and debris BID OU ( each eye ) before administering eye drops.   SYSTANE NIGHTTIME Oint Place 1 application into the left eye at bedtime. Apply to left eye at night   warfarin 3 MG tablet Commonly known as:  COUMADIN Take 3 mg by mouth daily.      Contact information for after-discharge care    Destination    [facility] .   Specialty:  Skilled Nursing Facility Contact information: [address] [place] [address] [phone]             Allergies  Allergen Reactions  . Penicillins     Has patient had a PCN reaction causing immediate rash, facial/tongue/throat swelling, SOB or lightheadedness with hypotension: unknown Has patient had a PCN reaction causing severe rash involving mucus membranes or skin necrosis: unknown Has patient had a PCN reaction that required hospitalization: unknown Has patient had a PCN reaction occurring within the last 10 years: unknown If all of the above answers are "NO", then may proceed with Cephalosporin use. 5/3 Tolerated Cefepime x 1 dose in ED.     Consultations:  Gastroenterology  Gen Surgery   Procedures/Studies: Ct Abdomen Pelvis Wo Contrast  Result Date: 10/27/2016 CLINICAL DATA:  Abdominal pain and distention.  Recurrent volvulus. EXAM: CT ABDOMEN AND PELVIS WITHOUT CONTRAST TECHNIQUE:  Multidetector CT imaging of the abdomen and pelvis was performed following the standard protocol without IV contrast. COMPARISON:  10/21/2016 FINDINGS: Lower chest: Atelectasis or consolidation in the lung bases, greater on the right. Can't exclude pneumonia. Coronary artery and aortic calcifications. Hepatobiliary: No focal liver abnormality is seen. No gallstones, gallbladder wall thickening, or biliary dilatation. Pancreas: Unremarkable. No pancreatic ductal dilatation or surrounding inflammatory changes. Spleen: Normal in size without focal abnormality. Adrenals/Urinary Tract: Adrenal glands are unremarkable. Kidneys are normal, without renal calculi, focal lesion, or hydronephrosis. Bladder is unremarkable. Stomach/Bowel: Stomach and small bowel are decompressed. There is prominent gaseous distention of the colon with scattered stool throughout the colon. Contrast material was instilled through a rectal tube. There is only a small amount of contrast material in the colon. There is a fairly tight area of narrowing at the rectosigmoid junction through which the catheter passes. The rectum itself is decompressed. Transition zone does not appear to be acutely twisted at this time and may have been reduced by the rectal catheter. There is suggestion of a stricture at this location with prominent proximal distention similar to previous study. No discrete mass  lesion is appreciated. Vascular/Lymphatic: Aortic atherosclerosis. No enlarged abdominal or pelvic lymph nodes. Reproductive: Prostate is unremarkable. Other: No free air or free fluid in the abdomen. Moderate-sized periumbilical hernia containing fat. No bowel herniation. Musculoskeletal: Degenerative changes in the spine. No destructive bone lesions. IMPRESSION: Prominent gaseous distention of the colon to the rectosigmoid junction where there appears to be a focal stricture. No acute twisting at the level of the stricture at this time. A rectal catheter was  placed for contrast administration in the catheter passes through the stricture into the dilated colon. The rectum is decompressed. Electronically Signed   By: Burman Nieves M.D.   On: 10/27/2016 06:43   Dg Abd 1 View  Result Date: 11/05/2016 CLINICAL DATA:  Recent sigmoid volvulus EXAM: ABDOMEN - 1 VIEW COMPARISON:  Nov 03, 2016 FINDINGS: The previous sigmoid volvulus is no longer evident. There is mild dilatation of several loops of colon which may indicate a degree of colonic ileus. No free air. No air-fluid levels. There are phleboliths in the pelvis. IMPRESSION: Suspect a degree of colonic ileus. No frank volvulus evident by radiography. No bowel obstruction or free air apparent. Electronically Signed   By: Bretta Bang III M.D.   On: 11/05/2016 10:37   Dg Abd 1 View  Result Date: 11/03/2016 CLINICAL DATA:  Abdominal bloating. EXAM: ABDOMEN - 1 VIEW COMPARISON:  CT scan and radiographs of same day. FINDINGS: There remains severe dilatation of the sigmoid colon consistent with sigmoid volvulus as described on prior CT scan. No small bowel dilatation is noted. Rectal tube is noted. Proximal colonic dilatation noted on prior exam appears to be significantly improved. IMPRESSION: Rectal tube is noted. Proximal colonic dilatation noted on prior exam appears to be significantly improved, although there is continued severe dilatation the sigmoid colon consistent with sigmoid volvulus as described on prior CT scan. Electronically Signed   By: Lupita Raider, M.D.   On: 11/03/2016 10:54   Dg Abd 1 View  Result Date: 10/28/2016 CLINICAL DATA:  Abdominal distension. EXAM: ABDOMEN - 1 VIEW COMPARISON:  10/27/2016 FINDINGS: There is persistent prominent gaseous distension of the sigmoid colon, estimated at 13 cm maximal diameter. The maximal of the sigmoid colon diameter has not significantly changed from yesterday's radiographs, however the descending and more proximal colon now appear nondilated with  only a relatively small amount of gas present. Limited assessment for free air on this supine study. No dilated small bowel loops are seen. Bibasilar lung opacities are again partially visualized. Lumbar spondylosis is noted. IMPRESSION: Persistent gaseous distension of the sigmoid colon. Interval resolution of more proximal colonic distention. Electronically Signed   By: Sebastian Ache M.D.   On: 10/28/2016 08:08   Dg Abd 1 View  Result Date: 10/25/2016 CLINICAL DATA:  Status post sigmoid volvulus decompression. EXAM: ABDOMEN - 1 VIEW COMPARISON:  10/23/2016.  Scout image from abdomen CT of 10/31/2016. FINDINGS: Diffuse gaseous colonic distention persists but appears slightly improved in the interval. Degenerative changes noted lumbar spine. Bones are demineralized. Phleboliths are seen over the anatomic pelvis. IMPRESSION: Persistent but slightly decreased diffuse gaseous colonic dilatation. Electronically Signed   By: Kennith Center M.D.   On: 10/25/2016 18:34   Dg Chest Port 1 View  Result Date: 10/21/2016 CLINICAL DATA:  Shortness of breath and fevers EXAM: PORTABLE CHEST 1 VIEW COMPARISON:  03/20/2015 FINDINGS: Cardiac shadow is mildly enlarged but stable. Overall poor inspiratory effort is noted. Some basilar atelectasis is seen likely related to the poor  inspiratory effort. No sizable effusion is seen. No bony abnormality is noted. IMPRESSION: Poor inspiratory effort with basilar atelectasis. Electronically Signed   By: Alcide CleverMark  Lukens M.D.   On: 10/21/2016 09:47   Dg Abd Acute W/chest  Result Date: 11/03/2016 CLINICAL DATA:  Abdominal pain for 2 days.  Abdominal distension. EXAM: DG ABDOMEN ACUTE W/ 1V CHEST COMPARISON:  Abdominal radiograph 10/28/2016, abdominal CT 10/27/2016 FINDINGS: Very low lung volumes with bibasilar atelectasis. Heart size accentuated by low lung volumes. There is tortuosity of the thoracic aorta. No large pleural effusion. Diffuse gas just distention of colon, increased colonic  distention from prior exam. Sigmoid distention is similar to prior exam. No evidence of free air. No small bowel dilatation. No radiopaque calculi. No acute osseous abnormalities are seen. IMPRESSION: Progressive proximal colonic distention, significant sigmoid distention is similar. Bowel gas pattern appears similar to CT 10/27/2016. No evidence of free air. Low lung volumes with bibasilar atelectasis. Electronically Signed   By: Rubye OaksMelanie  Ehinger M.D.   On: 11/03/2016 06:06   Dg Abdomen Acute W/chest  Result Date: 10/27/2016 CLINICAL DATA:  Abdominal pain and distention.  History of volvulus. EXAM: DG ABDOMEN ACUTE W/ 1V CHEST COMPARISON:  10/25/2016 FINDINGS: Shallow inspiration with linear atelectasis in the lung bases. Heart size and pulmonary vascularity are normal for technique. No pneumothorax. Calcified aorta. Diffuse gaseous distention of the colon is similar to previous study. 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is not excluded. No free intra-abdominal air is demonstrated. No radiopaque stones. Calcified phleboliths in the pelvis. Degenerative changes in the spine. IMPRESSION: Shallow inspiration with atelectasis in the lung bases. Persistent diffuse gaseous distention of the colon similar to previous studies. Electronically Signed   By: Burman NievesWilliam  Stevens M.D.   On: 10/27/2016 04:41   Dg Abd Portable 1v  Result Date: 10/23/2016 CLINICAL DATA:  Followup sigmoid volvulus. EXAM: PORTABLE ABDOMEN - 1 VIEW COMPARISON:  None. FINDINGS: There is continued diffuse abnormal gaseous distension of the large bowel with marked gaseous distension of the sigmoid colon. No free intraperitoneal air identified. IMPRESSION: 1. Persistent abnormal distension of the bowel loops particularly the sigmoid colon. Recurrent sigmoid volvulus would be difficult to exclude based on plain film radiographs and if the patient is clinically obstructed consider repeat CT of the abdomen and pelvis. Electronically Signed   By: Signa Kellaylor  Stroud M.D.   On:  10/23/2016 10:24   Dg Swallowing Func-speech Pathology  Result Date: 10/28/2016 Objective Swallowing Evaluation: Type of Study: MBS-Modified Barium Swallow Study Patient Details Name: [place] Des[place]is[place] T Dunleavy MRN: [id number] Date of Birth: November 27, 1945 Today's Date: 10/28/2016 Time: SLP Start Time (ACUTE ONLY): 1704-SLP Stop Time (ACUTE ONLY): 1736 SLP Time Calculation (min) (ACUTE ONLY): 32 min Past Medical History: Past Medical History: Diagnosis Date . Dysphasia  . Fatty liver  . Hemiplegia (HCC)  . Hypertension  . Pneumonia  . Pulmonary embolism (HCC)  . Stroke [facility](HCC)  Past Surgical History: Past Surgical History: Procedure Laterality Date . FLEXIBLE SIGMOIDOSCOPY N/A 10/21/2016  Procedure: FLEXIBLE SIGMOIDOSCOPY;  Surgeon: Malissa Hippoehman, Najeeb U, MD;  Location: [facility];  Service: Endoscopy;  Laterality: N/A; . unable   HPI: Mickie KayScoville T Dan HumphreysWalker is an 71 y.o. 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  He is unlikely to require surgery, but he did have aspiration yesterday and was made NPO pending speech re evaluation.   Subjective: "Ok" Assessment / Plan /  Recommendation CHL IP CLINICAL IMPRESSIONS 10/28/2016 Clinical Impression Pt presents with moderate oral phase dysphagia with reduced lip closure resulting in labial spillage with liquids, left buccal and lingual weakness resulting in decreased oral bolus cohesiveness with premature spillage of liquids; moderate pharyngeal phase dysphagia characterized by delay in swallow initiation with swallow trigger after filling the valleculae and spilling to pyriforms across consistencies and textures, reduced tongue base retraction, and reduced laryngeal closure resulting in penetration with nectars before and during the swallow as well as gross, silent aspiration of nectars after the swallow (delayed cough eventually elicited). SLP  instructed Pt to cough and he was able to produce strong cough, however it is unlikely that he was able to remove all of aspirated material. Pt with improved safety with honey-thick liquids (no penetration or aspiration) and puree. Swallow trigger was at the level of the pyriforms with mechanical soft textures. Pt with mild vallecular and pyriform residue across consistencies/textures and benefited from repeat/dry swallow after initial bite/sip. Recommend D1/puree with HTL via cup sips. Crush meds as able in puree and no straws. Pt will need 100% supervision with all po intake. Recommend f/u at SNF for diet tolerance and reinforcement of strategies.  SLP Visit Diagnosis Dysphagia, oropharyngeal phase (R13.12) Attention and concentration deficit following -- Frontal lobe and executive function deficit following -- Impact on safety and function Moderate aspiration risk   CHL IP TREATMENT RECOMMENDATION 10/28/2016 Treatment Recommendations Defer treatment plan to f/u with SLP   Prognosis 10/28/2016 Prognosis for Safe Diet Advancement Guarded Barriers to Reach Goals Time post onset;Severity of deficits Barriers/Prognosis Comment -- CHL IP DIET RECOMMENDATION 10/28/2016 SLP Diet Recommendations Dysphagia 1 (Puree) solids;Honey thick liquids Liquid Administration via Cup;No straw Medication Administration Crushed with puree Compensations Slow rate;Multiple dry swallows after each bite/sip;Clear throat intermittently Postural Changes Remain semi-upright after after feeds/meals (Comment);Seated upright at 90 degrees   CHL IP OTHER RECOMMENDATIONS 10/28/2016 Recommended Consults -- Oral Care Recommendations Oral care BID;Staff/trained caregiver to provide oral care Other Recommendations Order thickener from pharmacy;Prohibited food (jello, ice cream, thin soups);Clarify dietary restrictions   CHL IP FOLLOW UP RECOMMENDATIONS 10/28/2016 Follow up Recommendations Skilled Nursing facility   [facility] IP FREQUENCY AND DURATION 10/22/2016  Speech Therapy Frequency (ACUTE ONLY) min 2x/week Treatment Duration 2 weeks      CHL IP ORAL PHASE 10/28/2016 Oral Phase Impaired Oral - Pudding Teaspoon -- Oral - Pudding Cup -- Oral - Honey Teaspoon -- Oral - Honey Cup -- Oral - Nectar Teaspoon -- Oral - Nectar Cup Lingual/palatal residue;Piecemeal swallowing;Decreased bolus cohesion;Premature spillage Oral - Nectar Straw -- Oral - Thin Teaspoon -- Oral - Thin Cup -- Oral - Thin Straw -- Oral - Puree -- Oral - Mech Soft Impaired mastication;Piecemeal swallowing;Delayed oral transit;Decreased bolus cohesion Oral - Regular -- Oral - Multi-Consistency -- Oral - Pill -- Oral Phase - Comment --  CHL IP PHARYNGEAL PHASE 10/28/2016 Pharyngeal Phase Impaired Pharyngeal- Pudding Teaspoon -- Pharyngeal -- Pharyngeal- Pudding Cup -- Pharyngeal -- Pharyngeal- Honey Teaspoon -- Pharyngeal -- Pharyngeal- Honey Cup Delayed swallow initiation-vallecula;Pharyngeal residue - valleculae;Reduced tongue base retraction Pharyngeal -- Pharyngeal- Nectar Teaspoon -- Pharyngeal -- Pharyngeal- Nectar Cup Delayed swallow initiation-vallecula;Delayed swallow initiation-pyriform sinuses;Reduced epiglottic inversion;Reduced airway/laryngeal closure;Reduced tongue base retraction;Penetration/Aspiration before swallow;Penetration/Aspiration during swallow;Penetration/Apiration after swallow;Significant aspiration (Amount);Pharyngeal residue - valleculae;Pharyngeal residue - pyriform Pharyngeal Material enters airway, passes BELOW cords without attempt by patient to eject out (silent aspiration);Material enters airway, passes BELOW cords and not ejected out despite cough attempt by patient;Material enters airway, CONTACTS cords  and then ejected out Pharyngeal- Nectar Straw -- Pharyngeal -- Pharyngeal- Thin Teaspoon -- Pharyngeal -- Pharyngeal- Thin Cup -- Pharyngeal -- Pharyngeal- Thin Straw -- Pharyngeal -- Pharyngeal- Puree Delayed swallow initiation-vallecula;Reduced tongue base  retraction;Pharyngeal residue - valleculae Pharyngeal -- Pharyngeal- Mechanical Soft Pharyngeal residue - valleculae;Delayed swallow initiation-pyriform sinuses;Reduced epiglottic inversion;Reduced tongue base retraction Pharyngeal -- Pharyngeal- Regular -- Pharyngeal -- Pharyngeal- Multi-consistency -- Pharyngeal -- Pharyngeal- Pill -- Pharyngeal -- Pharyngeal Comment --  CHL IP CERVICAL ESOPHAGEAL PHASE 10/28/2016 Cervical Esophageal Phase WFL Pudding Teaspoon -- Pudding Cup -- Honey Teaspoon -- Honey Cup -- Nectar Teaspoon -- Nectar Cup -- Nectar Straw -- Thin Teaspoon -- Thin Cup -- Thin Straw -- Puree -- Mechanical Soft -- Regular -- Multi-consistency -- Pill -- Cervical Esophageal Comment -- Thank you, Havery Moros, CCC-SLP [phone] No flowsheet data found. PORTER,DABNEY 10/28/2016, 6:55 PM              Ct Renal Stone Study  Result Date: 11/03/2016 CLINICAL DATA:  Abdominal pain 2 days. Distended abdomen. History of volvulus. EXAM: CT ABDOMEN AND PELVIS WITHOUT CONTRAST TECHNIQUE: Multidetector CT imaging of the abdomen and pelvis was performed following the standard protocol without IV contrast. COMPARISON:  KUB 11/03/2016.  CT abdomen pelvis 10/27/2016 FINDINGS: Lower chest: Mild bibasilar atelectasis and small bilateral effusions. Cardiac enlargement.  Extensive coronary calcification. Hepatobiliary: Contracted gallbladder with multiple gallstones. No gallbladder wall thickening or biliary dilatation. 15 mm hepatic cyst on the right. Pancreas: Negative Spleen: Negative Adrenals/Urinary Tract: Negative for renal mass or obstruction. Small bilateral renal calculi. Negative urinary bladder. Stomach/Bowel: Severe colonic dilatation with multiple air-fluid levels. Transition point in the rectosigmoid compatible with sigmoid volvulus. This is the same location with a similar appearance to the CT of 10/21/2016. Rectal catheter is in place. Rectum decompressed. Layering oral contrast is seen in the colon,  from recent swallowing function study. Small hiatal hernia.  Negative for small bowel dilatation. Vascular/Lymphatic: Extensive atherosclerotic disease. No aortic aneurysm. Negative for lymphadenopathy. Reproductive: Normal prostate Other: Mild presacral soft tissue edema, increased from the prior study. Negative for abscess. Musculoskeletal: Disc degeneration and spondylosis in the lower lumbar spine. No acute skeletal abnormality. 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 This appearance is similar to the CT of 10/21/2016. Cholelithiasis Atherosclerotic disease.  Extensive coronary artery calcification Small hiatal hernia Small bilateral renal calculi without renal obstruction. Electronically Signed   By: Marlan Palau M.D.   On: 11/03/2016 08:26   Ct Renal Stone Study  Result Date: 10/21/2016 CLINICAL DATA:  Abdominal distention and shortness of Breath EXAM: CT ABDOMEN AND PELVIS WITHOUT CONTRAST TECHNIQUE: Multidetector CT imaging of the abdomen and pelvis was performed following the standard protocol without IV contrast. COMPARISON:  12/28/2007 FINDINGS: Lower chest: Bibasilar consolidation is noted right greater than left. Hepatobiliary: With the exception of a small cyst in the right lobe of the liver the liver and gallbladder are within normal limits. Pancreas: Unremarkable. No pancreatic ductal dilatation or surrounding inflammatory changes. Spleen: Normal in size without focal abnormality. Adrenals/Urinary Tract: The adrenal glands are within normal limits. Renal vascular calcifications are noted. Mild fullness of the left collecting system is seen which may be related to edema from recently passed stone as no definitive stone is identified. The bladder is well distended. Stomach/Bowel: The colon is distended with air and fecal material. There is an area of focal narrowing in the sigmoid colon best seen on image number 54 of series 2 and image 53 of  series 7. There are changes consistent with a mobile sigmoid and early sigmoid volvulus. The colon proximal to the area of narrowing in the sigmoid is significantly dilated. On the coronal imaging significant rotation of the sigmoid and its associated vascularity is noted. The appendix is within normal limits. No other bowel abnormality is seen. Vascular/Lymphatic: Aortic atherosclerosis. No enlarged abdominal or pelvic lymph nodes. Reproductive: Prostate is unremarkable. Other: Small fat containing umbilical hernia is noted. Musculoskeletal: Degenerative changes of lumbar spine are seen. No acute bony abnormality is noted. IMPRESSION: Changes consistent with sigmoid volume ileus and proximal colonic obstructive change. The distal most: Beyond the volvulus is decompressed. Surgical consultation is recommended. Mild fullness of the left renal collecting system without definitive stone. These results were called by telephone at the time of interpretation on 10/21/2016 at 11:32 am to Dr. Marily Memos , who verbally acknowledged these results. Electronically Signed   By: Alcide Clever M.D.   On: 10/21/2016 11:33        Subjective: No abdominal pain, no vomiting  Discharge Exam: Vitals:   11/05/16 0500 11/05/16 1456  BP: 117/67 (!) 142/70  Pulse: 66 79  Resp: 18 18  Temp: 97.5 F (36.4 C) 98.6 F (37 C)   Vitals:   11/05/16 0500 11/05/16 0727 11/05/16 1444 11/05/16 1456  BP: 117/67   (!) 142/70  Pulse: 66   79  Resp: 18   18  Temp: 97.5 F (36.4 C)   98.6 F (37 C)  TempSrc: Oral   Oral  SpO2: 100% 93% 93% 97%  Weight:      Height:        General: Pt is alert, awake, not in acute distress Cardiovascular: RRR, S1/S2 +, no rubs, no gallops Respiratory: bilateral rhonchi Abdominal: Soft, NT, ND, bowel sounds + Extremities: no edema, no cyanosis    The results of significant diagnostics from this hospitalization (including imaging, microbiology, ancillary and laboratory) are listed below  for reference.     Microbiology: No results found for this or any previous visit (from the past 240 hour(s)).   Labs: BNP (last 3 results)  Recent Labs  10/21/16 0914  BNP 58.0   Basic Metabolic Panel:  Recent Labs Lab 11/01/16 0700 11/02/16 0730 11/03/16 0455 11/04/16 0438 11/05/16 0416 11/05/16 1456  NA 146* 147* 144 143 141  --   K 2.8* 3.3* 3.1* 2.9* 2.9* 3.2*  CL 110 111 110 107 108  --   CO2 29 30 29 31 28   --   GLUCOSE 82 101* 147* 90 83  --   BUN 14 12 17 12 11   --   CREATININE 0.85 0.86 0.95 0.86 0.89  --   CALCIUM 8.1* 8.4* 8.3* 7.9* 7.6*  --   MG 1.8  --   --  1.6*  --   --    Liver Function Tests:  Recent Labs Lab 11/03/16 0455  AST 26  ALT 42  ALKPHOS 51  BILITOT 0.4  PROT 6.4*  ALBUMIN 2.9*    Recent Labs Lab 11/03/16 0455  LIPASE 17   No results for input(s): AMMONIA in the last 168 hours. CBC:  Recent Labs Lab 10/31/16 0638 11/01/16 0700 11/03/16 0455 11/04/16 0438 11/05/16 0416  WBC 11.8* 10.5 15.1* 14.0* 9.9  NEUTROABS  --  7.1 11.7*  --   --   HGB 12.3* 13.2 13.5 12.7* 11.8*  HCT 38.2* 40.2 41.5 39.8 35.7*  MCV 92.3 91.6 91.6 93.0 91.5  PLT 277 310  300 264 266   Cardiac Enzymes: No results for input(s): CKTOTAL, CKMB, CKMBINDEX, TROPONINI in the last 168 hours. BNP: Invalid input(s): POCBNP CBG: No results for input(s): GLUCAP in the last 168 hours. D-Dimer No results for input(s): DDIMER in the last 72 hours. Hgb A1c No results for input(s): HGBA1C in the last 72 hours. Lipid Profile No results for input(s): CHOL, HDL, LDLCALC, TRIG, CHOLHDL, LDLDIRECT in the last 72 hours. Thyroid function studies No results for input(s): TSH, T4TOTAL, T3FREE, THYROIDAB in the last 72 hours.  Invalid input(s): FREET3 Anemia work up No results for input(s): VITAMINB12, FOLATE, FERRITIN, TIBC, IRON, RETICCTPCT in the last 72 hours. Urinalysis    Component Value Date/Time   COLORURINE YELLOW 10/20/2016 0230   APPEARANCEUR  CLEAR 10/20/2016 0230   LABSPEC 1.025 10/20/2016 0230   PHURINE 5.5 10/20/2016 0230   GLUCOSEU NEGATIVE 10/20/2016 0230   HGBUR SMALL (A) 10/20/2016 0230   BILIRUBINUR NEGATIVE 10/20/2016 0230   KETONESUR TRACE (A) 10/20/2016 0230   PROTEINUR 100 (A) 10/20/2016 0230   NITRITE POSITIVE (A) 10/20/2016 0230   LEUKOCYTESUR NEGATIVE 10/20/2016 0230   Sepsis Labs Invalid input(s): PROCALCITONIN,  WBC,  LACTICIDVEN Microbiology No results found for this or any previous visit (from the past 240 hour(s)).   Time coordinating discharge: Over 30 minutes  SIGNED:   Erick Blinks, MD  [facility] 11/05/2016, 3:30 PM Pager   If 7PM-7AM, please contact night-coverage [email or web address] Password [email or web address]

## 2016-11-05 NOTE — NC FL2 (Signed)
[place] MEDICAID FL2 LEVEL OF CARE SCREENING TOOL     IDENTIFICATION  Patient Name: [name] [name] Birthdate: 07-07-1945 Sex: male Admission Date (Current Location): 11/03/2016  [facility] [place]   Facility and Address:  [facility],  [address], [place] [id number]      Provider Number: [id number]  Attending Physician Name and Address:  Erick BlinksMemon, Jehanzeb, MD  Relative Name and Phone Number:       Current Level of Care: Hospital Recommended Level of Care: Skilled Nursing Facility Prior Approval Number:    Date Approved/Denied:   PASRR Number:    Discharge Plan: SNF    Current Diagnoses: Patient Active Problem List   Diagnosis Date Noted  . Pressure injury of skin 10/28/2016  . Colonic obstruction (HCC) 10/27/2016  . Stroke (HCC) 10/27/2016  . Pulmonary embolism (HCC) 10/27/2016  . Hypertension 10/27/2016  . Hemiplegia (HCC) 10/27/2016  . Hypokalemia 10/27/2016  . 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 Volvulus (HCC) 10/21/2016  . COPD (chronic obstructive pulmonary disease) (HCC) 10/19/2016  . Skin lesion 10/07/2015  . History of bronchitis 10/05/2015  . Acute bronchitis 03/21/2015  . Edema 03/07/2015  . Stroke/cerebrovascular accident (HCC) 03/20/2014  . Cellulitis 04/10/2013  . Long term current use of anticoagulant therapy 10/21/2012  . Cerebral infarction (HCC) 09/26/2012  . HTN (hypertension) 09/26/2012  . DVT (deep venous thrombosis) (HCC) 09/26/2012  . Chronic pulmonary embolism (HCC) 09/26/2012  . Pneumonia 09/26/2012  . Constipation 09/26/2012  . IMPINGEMENT SYNDROME 02/14/2008    Orientation RESPIRATION BLADDER Height & Weight     Self, Place  O2 (3.5L) Incontinent Weight: 205 lb (93 kg) Height:  5\' 10"  (177.8 cm)  BEHAVIORAL SYMPTOMS/MOOD NEUROLOGICAL BOWEL NUTRITION STATUS      Incontinent Diet (Diet Dys 1. Honey Thick)  AMBULATORY STATUS COMMUNICATION OF NEEDS Skin   Extensive Assist Verbally PU  Stage and Appropriate Care (Stage III, right foot lateral)                       Personal Care Assistance Level of Assistance  Bathing, Feeding, Dressing Bathing Assistance: Maximum assistance Feeding assistance: Independent Dressing Assistance: Maximum assistance     Functional Limitations Info  Sight, Hearing, Speech Sight Info: Adequate Hearing Info: Adequate Speech Info: Impaired    SPECIAL CARE FACTORS FREQUENCY  PT (By licensed PT)                    Contractures      Additional Factors Info  Allergies   Allergies Info: Penicillins           Current Medications (11/05/2016):  This is the current hospital active medication list Current Facility-Administered Medications  Medication Dose Route Frequency Provider Last Rate Last Dose  . acetaminophen (TYLENOL) tablet 650 mg  650 mg Oral Q6H PRN Erick BlinksMemon, Jehanzeb, MD       Or  . acetaminophen (TYLENOL) suppository 650 mg  650 mg Rectal Q6H PRN Erick BlinksMemon, Jehanzeb, MD      . albuterol (PROVENTIL) (2.5 MG/3ML) 0.083% nebulizer solution 2.5 mg  2.5 mg Nebulization Q2H PRN Erick BlinksMemon, Jehanzeb, MD      . artificial tears (LACRILUBE) ophthalmic ointment   Left Eye QHS Memon, Jehanzeb, MD      . enoxaparin (LOVENOX) injection 95 mg  1 mg/kg Subcutaneous Q12H Erick BlinksMemon, Jehanzeb, MD   95 mg at 11/05/16 1156  . ipratropium-albuterol (DUONEB) 0.5-2.5 (3) MG/3ML nebulizer solution 3 mL  3  mL Nebulization TID Erick Blinks, MD   3 mL at 11/05/16 1443  . labetalol (NORMODYNE) tablet 200 mg  200 mg Oral Daily Erick Blinks, MD   200 mg at 11/05/16 1157  . lisinopril (PRINIVIL,ZESTRIL) tablet 20 mg  20 mg Oral Daily Erick Blinks, MD   20 mg at 11/05/16 1157  . ondansetron (ZOFRAN) tablet 4 mg  4 mg Oral Q6H PRN Erick Blinks, MD       Or  . ondansetron (ZOFRAN) injection 4 mg  4 mg Intravenous Q6H PRN Erick Blinks, MD      . polyvinyl alcohol (LIQUIFILM TEARS) 1.4 % ophthalmic solution 1 drop  1 drop Both Eyes BID Erick Blinks, MD   1 drop at 11/04/16 2146  . potassium chloride SA (K-DUR,KLOR-CON) CR tablet 40 mEq  40 mEq Oral Q3H Erick Blinks, MD   40 mEq at 11/05/16 1156  . potassium chloride SA (K-DUR,KLOR-CON) CR tablet 40 mEq  40 mEq Oral BID Erick Blinks, MD      . RESOURCE THICKENUP CLEAR   Oral PRN Erick Blinks, MD      . senna-docusate (Senokot-S) tablet 1 tablet  1 tablet Oral QHS Erick Blinks, MD   1 tablet at 11/04/16 2146  . warfarin (COUMADIN) tablet 3 mg  3 mg Oral Once Erick Blinks, MD      . Warfarin - Pharmacist Dosing Inpatient   Does not apply Q24H Erick Blinks, MD         Discharge Medications: Please see discharge summary for a list of discharge medications.  Relevant Imaging Results:  Relevant Lab Results:   Additional Information    Avondre Richens, Juleen China, LCSW

## 2016-11-06 ENCOUNTER — Other Ambulatory Visit (HOSPITAL_COMMUNITY)
Admission: RE | Admit: 2016-11-06 | Discharge: 2016-11-06 | Disposition: A | Discharge status: Home / Self Care | Payer: No Typology Code available for payment source | Source: Skilled Nursing Facility | Attending: Internal Medicine | Admitting: Internal Medicine

## 2016-11-06 DIAGNOSIS — E876 Hypokalemia: Secondary | ICD-10-CM | POA: Insufficient documentation

## 2016-11-06 LAB — CBC
HEMATOCRIT: 40.2 % (ref 39.0–52.0)
Hemoglobin: 13.3 g/dL (ref 13.0–17.0)
MCH: 30.1 pg (ref 26.0–34.0)
MCHC: 33.1 g/dL (ref 30.0–36.0)
MCV: 91 fL (ref 78.0–100.0)
PLATELETS: 256 10*3/uL (ref 150–400)
RBC: 4.42 MIL/uL (ref 4.22–5.81)
RDW: 14 % (ref 11.5–15.5)
WBC: 7.3 10*3/uL (ref 4.0–10.5)

## 2016-11-06 LAB — PROTIME-INR
INR: 1.56
Prothrombin Time: 18.9 seconds — ABNORMAL HIGH (ref 11.4–15.2)

## 2016-11-06 LAB — BASIC METABOLIC PANEL
ANION GAP: 6 (ref 5–15)
BUN: 7 mg/dL (ref 6–20)
CHLORIDE: 105 mmol/L (ref 101–111)
CO2: 29 mmol/L (ref 22–32)
CREATININE: 0.85 mg/dL (ref 0.61–1.24)
Calcium: 7.9 mg/dL — ABNORMAL LOW (ref 8.9–10.3)
GFR calc non Af Amer: >60 mL/min (ref >60)
Glucose, Bld: 94 mg/dL (ref 65–99)
POTASSIUM: 2.7 mmol/L — AB (ref 3.5–5.1)
SODIUM: 140 mmol/L (ref 135–145)

## 2016-11-07 ENCOUNTER — Other Ambulatory Visit (HOSPITAL_COMMUNITY)
Admission: RE | Admit: 2016-11-07 | Discharge: 2016-11-07 | Disposition: A | Discharge status: Home / Self Care | Payer: No Typology Code available for payment source | Source: Skilled Nursing Facility | Attending: Internal Medicine | Admitting: Internal Medicine

## 2016-11-07 DIAGNOSIS — Z7901 Long term (current) use of anticoagulants: Secondary | ICD-10-CM | POA: Insufficient documentation

## 2016-11-07 LAB — PROTIME-INR
INR: 1.93
Prothrombin Time: 22.3 seconds — ABNORMAL HIGH (ref 11.4–15.2)

## 2016-11-07 LAB — BASIC METABOLIC PANEL
ANION GAP: 4 — AB (ref 5–15)
BUN: 9 mg/dL (ref 6–20)
CO2: 29 mmol/L (ref 22–32)
Calcium: 8 mg/dL — ABNORMAL LOW (ref 8.9–10.3)
Chloride: 111 mmol/L (ref 101–111)
Creatinine, Ser: 0.83 mg/dL (ref 0.61–1.24)
GFR calc Af Amer: >60 mL/min (ref >60)
Glucose, Bld: 84 mg/dL (ref 65–99)
POTASSIUM: 3 mmol/L — AB (ref 3.5–5.1)
SODIUM: 144 mmol/L (ref 135–145)

## 2016-11-08 ENCOUNTER — Encounter: Payer: Self-pay | Admitting: Internal Medicine

## 2016-11-08 ENCOUNTER — Encounter (HOSPITAL_COMMUNITY)
Admission: RE | Admit: 2016-11-08 | Discharge: 2016-11-08 | Disposition: A | Discharge status: Home / Self Care | Payer: No Typology Code available for payment source | Source: Skilled Nursing Facility | Attending: Pediatrics | Admitting: Pediatrics

## 2016-11-08 ENCOUNTER — Non-Acute Institutional Stay (SKILLED_NURSING_FACILITY): Payer: Medicare Other | Admitting: Internal Medicine

## 2016-11-08 DIAGNOSIS — J189 Pneumonia, unspecified organism: Secondary | ICD-10-CM | POA: Insufficient documentation

## 2016-11-08 DIAGNOSIS — Z7901 Long term (current) use of anticoagulants: Secondary | ICD-10-CM | POA: Diagnosis not present

## 2016-11-08 DIAGNOSIS — E876 Hypokalemia: Secondary | ICD-10-CM | POA: Diagnosis not present

## 2016-11-08 DIAGNOSIS — I639 Cerebral infarction, unspecified: Secondary | ICD-10-CM

## 2016-11-08 DIAGNOSIS — I2782 Chronic pulmonary embolism: Secondary | ICD-10-CM

## 2016-11-08 DIAGNOSIS — Z993 Dependence on wheelchair: Secondary | ICD-10-CM | POA: Insufficient documentation

## 2016-11-08 DIAGNOSIS — I69328 Other speech and language deficits following cerebral infarction: Secondary | ICD-10-CM | POA: Diagnosis not present

## 2016-11-08 DIAGNOSIS — I10 Essential (primary) hypertension: Secondary | ICD-10-CM | POA: Insufficient documentation

## 2016-11-08 DIAGNOSIS — K562 Volvulus: Secondary | ICD-10-CM | POA: Diagnosis not present

## 2016-11-08 DIAGNOSIS — J441 Chronic obstructive pulmonary disease with (acute) exacerbation: Secondary | ICD-10-CM

## 2016-11-08 DIAGNOSIS — L89612 Pressure ulcer of right heel, stage 2: Secondary | ICD-10-CM | POA: Diagnosis not present

## 2016-11-08 DIAGNOSIS — G8191 Hemiplegia, unspecified affecting right dominant side: Secondary | ICD-10-CM | POA: Insufficient documentation

## 2016-11-08 LAB — BASIC METABOLIC PANEL
Anion gap: 7 (ref 5–15)
BUN: 5 mg/dL — ABNORMAL LOW (ref 6–20)
CALCIUM: 8.1 mg/dL — AB (ref 8.9–10.3)
CHLORIDE: 104 mmol/L (ref 101–111)
CO2: 30 mmol/L (ref 22–32)
Creatinine, Ser: 0.79 mg/dL (ref 0.61–1.24)
GFR calc non Af Amer: >60 mL/min (ref >60)
Glucose, Bld: 73 mg/dL (ref 65–99)
POTASSIUM: 3.1 mmol/L — AB (ref 3.5–5.1)
SODIUM: 141 mmol/L (ref 135–145)

## 2016-11-08 LAB — PROTIME-INR
INR: 1.84
Prothrombin Time: 21.5 seconds — ABNORMAL HIGH (ref 11.4–15.2)

## 2016-11-08 NOTE — Progress Notes (Signed)
Provider: Einar Crow  Location:   [facility] Room Number: 112/W Place of Service:  SNF (31)  PCP: Mahlon Gammon, MD [name], [name]  Extended Emergency Contact Information Primary Emergency Contact: [name] Address: [address]          [place], [place] [address] [place] of [place] Home Phone: [phone] Mobile Phone: [phone] Relation: Brother  Code Status: Full Code Goals of Care: Advanced Directive information Advanced Directives 11/08/2016  Does Patient Have a Medical Advance Directive? Yes  Type of Advance Directive (No Data)  Does patient want to make changes to medical advance directive? No - Patient declined  Copy of Healthcare Power of Attorney in Chart? -  Would patient like information on creating a medical advance directive? No - Patient declined      Chief Complaint  Patient presents with  . Readmit To SNF    HPI: Patient is a 71 y.o. male seen today for Readmission to SNF for therapy and Long term care.  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 This is his fourth admission as patient  Gets better with Decompression and he has recurrence. He was evaluated by Surgery this admission and he refused surgery and Surgeons think he is high risk for surgery. He was managed Conservatively By Dr Karilyn Cota this admission. He had rectal tube placed for decompression. He was also maintained on Miralax and Lactulose was d;ced . He also had hypokalemia which is now supplemented with Potassium.  Patient is doing much better. He seems to be at his baseline now . He says his Pain is gone. He is also on high Dysphagic diet but tolerating well. Is still having Loose stools. Continues to have productive Cough. SOB is  resolved and better. Patient is eating well.   Past Medical History:  Diagnosis Date  . Dysphasia   . Fatty liver   . Hemiplegia (HCC)   . Hypertension   . Pneumonia   . Pulmonary embolism (HCC)   . Stroke [facility])    Past Surgical History:  Procedure Laterality Date  . FLEXIBLE SIGMOIDOSCOPY N/A 10/21/2016   Procedure: FLEXIBLE SIGMOIDOSCOPY;  Surgeon: Malissa Hippo, MD;  Location: [facility];  Service: Endoscopy;  Laterality: N/A;  . FLEXIBLE SIGMOIDOSCOPY N/A 10/25/2016   Procedure: FLEXIBLE SIGMOIDOSCOPY;  Surgeon: Malissa Hippo, MD;  Location: [facility];  Service: Endoscopy;  Laterality: N/A;  . unable      reports that he has never smoked. He has never used smokeless tobacco. He reports that he does not drink alcohol or use drugs. Social History   Social History  . Marital status: Single    Spouse name: N/A  . Number of children: N/A  . Years of education: N/A   Occupational History  . Not on file.   Social History Main Topics  . Smoking status: Never Smoker  . Smokeless tobacco: Never Used  . Alcohol use No  . Drug use: No  . Sexual activity: Not on file   Other Topics Concern  . Not on file   Social History Narrative  . No narrative on file    Functional Status Survey:    History reviewed. No pertinent family history.  Health Maintenance  Topic Date Due  . COLONOSCOPY  10/30/2025 (Originally 11/08/1995)  . TETANUS/TDAP  10/30/2025 (Originally 11/07/1964)  . INFLUENZA VACCINE  01/19/2017  . PNA vac Low Risk Adult (2 of 2 - PCV13) 03/30/2017  . Hepatitis C Screening  Completed    Allergies  Allergen Reactions  . Penicillins     Has patient had a PCN reaction causing immediate rash, facial/tongue/throat swelling, SOB or lightheadedness with hypotension: unknown Has patient had a PCN reaction causing severe rash involving mucus membranes or skin necrosis: unknown Has patient had a PCN reaction that required hospitalization: unknown Has  patient had a PCN reaction occurring within the last 10 years: unknown If all of the above answers are "NO", then may proceed with Cephalosporin use. 5/3 Tolerated Cefepime x 1 dose in ED.     Allergies as of 11/08/2016      Reactions   Penicillins    Has patient had a PCN reaction causing immediate rash, facial/tongue/throat swelling, SOB or lightheadedness with hypotension: unknown Has patient had a PCN reaction causing severe rash involving mucus membranes or skin necrosis: unknown Has patient had a PCN reaction that required hospitalization: unknown Has patient had a PCN reaction occurring within the last 10 years: unknown If all of the above answers are "NO", then may proceed with Cephalosporin use. 5/3 Tolerated Cefepime x 1 dose in ED.      Medication List    Notice   This visit is during an admission. Changes to the med list made in this visit will be reflected in the After Visit Summary of the admission.     Review of Systems  Constitutional: Negative.   HENT: Positive for drooling. Negative for congestion, postnasal drip and rhinorrhea.   Respiratory: Positive for cough and wheezing. Negative for apnea, shortness of breath and stridor.   Cardiovascular: Positive for leg swelling. Negative for chest pain and palpitations.  Gastrointestinal: Negative for abdominal distention, abdominal pain, constipation and nausea.  Genitourinary: Negative.   Musculoskeletal: Negative.   Skin: Negative.   Neurological: Negative.   Psychiatric/Behavioral: Negative.     Vitals:   11/08/16 1102  BP: 138/89  Pulse: 80  Resp: 20  Temp: 97.6 F (36.4 C)  TempSrc: Oral   There is no height or weight on file to calculate BMI. Physical Exam  Constitutional: He is oriented to person, place, and time. He appears well-developed and well-nourished.  HENT:  Head: Normocephalic.  Mouth/Throat: Oropharynx is clear and moist.  Eyes: Pupils are equal, round, and reactive to light.  Neck:  Neck supple.  Cardiovascular: Normal rate, regular rhythm and normal heart sounds.   No murmur heard. Pulmonary/Chest: Effort normal.  Has Expiratory wheezing B/L  Abdominal: Soft. Bowel sounds are normal. He exhibits no distension. There is no tenderness. There is no rebound.  Musculoskeletal:  Has Moderate edema B/L  Neurological: He is alert and oriented to person, place, and time.  Right sided Hemiparesis.  Skin: Skin is warm and dry.  Patient has stage 2 heel Ulcer on right leg  Psychiatric: He has a normal mood and affect. His behavior is normal. Thought content normal.    Labs reviewed: Basic Metabolic Panel:  Recent Labs  16/03/9604/11/18 0412  11/01/16 0700  11/04/16 0438  11/06/16 0900 11/07/16 0425 11/08/16 0700  NA 147*  < > 146*  < > 143  < > 140 144 141  K 2.6*  < > 2.8*  < > 2.9*  < > 2.7* 3.0* 3.1*  CL 115*  < > 110  < >  107  < > 105 111 104  CO2 28  < > 29  < > 31  < > 29 29 30   GLUCOSE 96  < > 82  < > 90  < > 94 84 73  BUN 9  < > 14  < > 12  < > 7 9 5*  CREATININE 0.89  < > 0.85  < > 0.86  < > 0.85 0.83 0.79  CALCIUM 7.9*  < > 8.1*  < > 7.9*  < > 7.9* 8.0* 8.1*  MG 1.6*  --  1.8  --  1.6*  --   --   --   --   < > = values in this interval not displayed. Liver Function Tests:  Recent Labs  10/19/16 1150 10/21/16 0914 11/03/16 0455  AST 29 38 26  ALT 39 45 42  ALKPHOS 74 59 51  BILITOT 0.7 0.7 0.4  PROT 9.0* 8.2* 6.4*  ALBUMIN 4.0 3.5 2.9*    Recent Labs  11/03/16 0455  LIPASE 17   No results for input(s): AMMONIA in the last 8760 hours. CBC:  Recent Labs  10/27/16 0527  11/01/16 0700 11/03/16 0455 11/04/16 0438 11/05/16 0416 11/06/16 0900  WBC 14.2*  < > 10.5 15.1* 14.0* 9.9 7.3  NEUTROABS 10.3*  --  7.1 11.7*  --   --   --   HGB 13.5  < > 13.2 13.5 12.7* 11.8* 13.3  HCT 41.7  < > 40.2 41.5 39.8 35.7* 40.2  MCV 91.2  < > 91.6 91.6 93.0 91.5 91.0  PLT 282  < > 310 300 264 266 256  < > = values in this interval not displayed. Cardiac  Enzymes:  Recent Labs  10/21/16 1940 10/22/16 0015 10/22/16 0612  TROPONINI 0.05* 0.05* 0.03*   BNP: Invalid input(s): POCBNP Lab Results  Component Value Date   HGBA1C  03/27/2009    6.0 (NOTE) The ADA recommends the following therapeutic goal for glycemic control related to Hgb A1c measurement: Goal of therapy: <6.5 Hgb A1c  Reference: American Diabetes Association: Clinical Practice Recommendations 2010, Diabetes Care, 2010, 33: (Suppl  1).   Lab Results  Component Value Date   TSH <0.010 (L) 10/27/2016   No results found for: VITAMINB12 No results found for: FOLATE No results found for: IRON, TIBC, FERRITIN  Imaging and Procedures obtained prior to SNF admission: No results found.  Assessment/Plan 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To be managed conservatively. He is on Miralax. And per Nurses is still having semisolid stools. He is also on Rectal tube at night. To be followed closely by Dr Karilyn Cota. Right Now patient is doing and feeling much better but stays at risk of recurrence.  COPD Contiues to have expiratory wheezing. Will restart his Nebs. Slowly tapering Oxygen  H/O DVT and PE On coumadin. Last INR today was 1.84. Continue Lovenox till tomorrow . Repeat INR.tomorrow. Continue on 3 mg of coumadin.  S/P CVA Patient stable. BP controlled. He is now on Dyphagic Diet and doing well.  Hypokalemia On 60 Meq QD Repeat BMP  LE edema Patient has more edema then usual. His weight from 2 months is slightly elevated from 194 to 198 lbs. Will not start lasix yet. Will Continue to follow him clinically and let him stabilize . Follow in few days.  Decubitus ulcer of right heel, stage 3 Per wound care nurse Trying to Keep Pressure off the Heel. On Dry Dressing.  Essential hypertension BP mildly elevated. But continue Labetalol  and Lisinopril   Family/ staff Communication:   Labs/tests ordered: BMP, CBC, INR Total time spent in this patient care encounter was 45_ minutes;  greater than 50% of the visit spent counseling patient and coordinating care for problems addressed at this encounter.

## 2016-11-08 NOTE — Progress Notes (Signed)
This encounter was created in error - please disregard.

## 2016-11-09 ENCOUNTER — Encounter (HOSPITAL_COMMUNITY)
Admission: RE | Admit: 2016-11-09 | Discharge: 2016-11-09 | Disposition: A | Discharge status: Home / Self Care | Payer: Medicare Other | Source: Skilled Nursing Facility | Attending: Internal Medicine | Admitting: Internal Medicine

## 2016-11-09 DIAGNOSIS — J189 Pneumonia, unspecified organism: Secondary | ICD-10-CM | POA: Insufficient documentation

## 2016-11-09 DIAGNOSIS — E876 Hypokalemia: Secondary | ICD-10-CM | POA: Insufficient documentation

## 2016-11-09 DIAGNOSIS — Z993 Dependence on wheelchair: Secondary | ICD-10-CM | POA: Insufficient documentation

## 2016-11-09 DIAGNOSIS — I69328 Other speech and language deficits following cerebral infarction: Secondary | ICD-10-CM | POA: Insufficient documentation

## 2016-11-09 DIAGNOSIS — I10 Essential (primary) hypertension: Secondary | ICD-10-CM | POA: Insufficient documentation

## 2016-11-09 DIAGNOSIS — G8191 Hemiplegia, unspecified affecting right dominant side: Secondary | ICD-10-CM | POA: Insufficient documentation

## 2016-11-09 LAB — BASIC METABOLIC PANEL
Anion gap: 7 (ref 5–15)
BUN: 6 mg/dL (ref 6–20)
CHLORIDE: 103 mmol/L (ref 101–111)
CO2: 31 mmol/L (ref 22–32)
CREATININE: 0.81 mg/dL (ref 0.61–1.24)
Calcium: 8.3 mg/dL — ABNORMAL LOW (ref 8.9–10.3)
GFR calc non Af Amer: >60 mL/min (ref >60)
Glucose, Bld: 88 mg/dL (ref 65–99)
POTASSIUM: 3 mmol/L — AB (ref 3.5–5.1)
Sodium: 141 mmol/L (ref 135–145)

## 2016-11-09 LAB — PROTIME-INR
INR: 1.8
Prothrombin Time: 21.1 seconds — ABNORMAL HIGH (ref 11.4–15.2)

## 2016-11-11 ENCOUNTER — Encounter (HOSPITAL_COMMUNITY)
Admission: RE | Admit: 2016-11-11 | Discharge: 2016-11-11 | Disposition: A | Discharge status: Home / Self Care | Payer: Medicare Other | Source: Skilled Nursing Facility | Attending: Internal Medicine | Admitting: Internal Medicine

## 2016-11-11 DIAGNOSIS — I10 Essential (primary) hypertension: Secondary | ICD-10-CM | POA: Insufficient documentation

## 2016-11-11 DIAGNOSIS — M6281 Muscle weakness (generalized): Secondary | ICD-10-CM | POA: Insufficient documentation

## 2016-11-11 DIAGNOSIS — R1312 Dysphagia, oropharyngeal phase: Secondary | ICD-10-CM | POA: Insufficient documentation

## 2016-11-11 DIAGNOSIS — R279 Unspecified lack of coordination: Secondary | ICD-10-CM | POA: Insufficient documentation

## 2016-11-11 DIAGNOSIS — I69328 Other speech and language deficits following cerebral infarction: Secondary | ICD-10-CM | POA: Insufficient documentation

## 2016-11-11 LAB — BASIC METABOLIC PANEL
Anion gap: 7 (ref 5–15)
BUN: 10 mg/dL (ref 6–20)
CALCIUM: 8.2 mg/dL — AB (ref 8.9–10.3)
CO2: 29 mmol/L (ref 22–32)
CREATININE: 0.9 mg/dL (ref 0.61–1.24)
Chloride: 105 mmol/L (ref 101–111)
GFR calc Af Amer: >60 mL/min (ref >60)
Glucose, Bld: 81 mg/dL (ref 65–99)
Potassium: 3.7 mmol/L (ref 3.5–5.1)
Sodium: 141 mmol/L (ref 135–145)

## 2016-11-13 ENCOUNTER — Other Ambulatory Visit (HOSPITAL_COMMUNITY)
Admission: RE | Admit: 2016-11-13 | Discharge: 2016-11-13 | Disposition: A | Discharge status: Home / Self Care | Payer: No Typology Code available for payment source | Source: Other Acute Inpatient Hospital | Attending: Internal Medicine | Admitting: Internal Medicine

## 2016-11-13 DIAGNOSIS — E876 Hypokalemia: Secondary | ICD-10-CM | POA: Insufficient documentation

## 2016-11-13 LAB — BASIC METABOLIC PANEL
ANION GAP: 6 (ref 5–15)
BUN: 14 mg/dL (ref 6–20)
CHLORIDE: 104 mmol/L (ref 101–111)
CO2: 28 mmol/L (ref 22–32)
CREATININE: 0.93 mg/dL (ref 0.61–1.24)
Calcium: 8.5 mg/dL — ABNORMAL LOW (ref 8.9–10.3)
GFR calc Af Amer: >60 mL/min (ref >60)
GFR calc non Af Amer: >60 mL/min (ref >60)
Glucose, Bld: 81 mg/dL (ref 65–99)
Potassium: 4.3 mmol/L (ref 3.5–5.1)
Sodium: 138 mmol/L (ref 135–145)

## 2016-11-14 ENCOUNTER — Other Ambulatory Visit (HOSPITAL_COMMUNITY)
Admission: RE | Admit: 2016-11-14 | Discharge: 2016-11-14 | Disposition: A | Discharge status: Home / Self Care | Payer: No Typology Code available for payment source | Source: Skilled Nursing Facility | Attending: Internal Medicine | Admitting: Internal Medicine

## 2016-11-14 ENCOUNTER — Encounter (HOSPITAL_COMMUNITY)
Admission: RE | Admit: 2016-11-14 | Discharge: 2016-11-14 | Disposition: A | Discharge status: Home / Self Care | Payer: Medicare Other | Source: Skilled Nursing Facility | Attending: *Deleted | Admitting: *Deleted

## 2016-11-14 DIAGNOSIS — E876 Hypokalemia: Secondary | ICD-10-CM | POA: Insufficient documentation

## 2016-11-14 LAB — BASIC METABOLIC PANEL
ANION GAP: 5 (ref 5–15)
BUN: 14 mg/dL (ref 6–20)
CHLORIDE: 103 mmol/L (ref 101–111)
CO2: 28 mmol/L (ref 22–32)
Calcium: 8.4 mg/dL — ABNORMAL LOW (ref 8.9–10.3)
Creatinine, Ser: 0.95 mg/dL (ref 0.61–1.24)
GFR calc Af Amer: >60 mL/min (ref >60)
Glucose, Bld: 81 mg/dL (ref 65–99)
POTASSIUM: 4.2 mmol/L (ref 3.5–5.1)
SODIUM: 136 mmol/L (ref 135–145)

## 2016-11-16 ENCOUNTER — Encounter (HOSPITAL_COMMUNITY)
Admission: RE | Admit: 2016-11-16 | Discharge: 2016-11-16 | Disposition: A | Discharge status: Home / Self Care | Payer: Medicare Other | Source: Skilled Nursing Facility | Attending: Internal Medicine | Admitting: Internal Medicine

## 2016-11-16 DIAGNOSIS — R1312 Dysphagia, oropharyngeal phase: Secondary | ICD-10-CM | POA: Insufficient documentation

## 2016-11-16 DIAGNOSIS — I69328 Other speech and language deficits following cerebral infarction: Secondary | ICD-10-CM | POA: Insufficient documentation

## 2016-11-16 DIAGNOSIS — E876 Hypokalemia: Secondary | ICD-10-CM | POA: Insufficient documentation

## 2016-11-16 DIAGNOSIS — I10 Essential (primary) hypertension: Secondary | ICD-10-CM | POA: Insufficient documentation

## 2016-11-16 DIAGNOSIS — M6281 Muscle weakness (generalized): Secondary | ICD-10-CM | POA: Insufficient documentation

## 2016-11-16 DIAGNOSIS — R279 Unspecified lack of coordination: Secondary | ICD-10-CM | POA: Insufficient documentation

## 2016-11-16 LAB — BASIC METABOLIC PANEL
ANION GAP: 7 (ref 5–15)
BUN: 13 mg/dL (ref 6–20)
CO2: 30 mmol/L (ref 22–32)
CREATININE: 0.97 mg/dL (ref 0.61–1.24)
Calcium: 9 mg/dL (ref 8.9–10.3)
Chloride: 103 mmol/L (ref 101–111)
GFR calc Af Amer: >60 mL/min (ref >60)
GLUCOSE: 90 mg/dL (ref 65–99)
Potassium: 4.3 mmol/L (ref 3.5–5.1)
Sodium: 140 mmol/L (ref 135–145)

## 2016-11-16 LAB — PROTIME-INR
INR: 1.59
PROTHROMBIN TIME: 19.1 s — AB (ref 11.4–15.2)

## 2016-11-20 ENCOUNTER — Encounter (HOSPITAL_COMMUNITY)
Admission: RE | Admit: 2016-11-20 | Discharge: 2016-11-20 | Disposition: A | Discharge status: Home / Self Care | Payer: Medicare Other | Source: Skilled Nursing Facility | Attending: Internal Medicine | Admitting: Internal Medicine

## 2016-11-20 DIAGNOSIS — R293 Abnormal posture: Secondary | ICD-10-CM | POA: Insufficient documentation

## 2016-11-20 DIAGNOSIS — I69998 Other sequelae following unspecified cerebrovascular disease: Secondary | ICD-10-CM | POA: Insufficient documentation

## 2016-11-20 DIAGNOSIS — I10 Essential (primary) hypertension: Secondary | ICD-10-CM | POA: Insufficient documentation

## 2016-11-20 DIAGNOSIS — G8191 Hemiplegia, unspecified affecting right dominant side: Secondary | ICD-10-CM | POA: Insufficient documentation

## 2016-11-20 DIAGNOSIS — Z7901 Long term (current) use of anticoagulants: Secondary | ICD-10-CM | POA: Insufficient documentation

## 2016-11-20 LAB — BASIC METABOLIC PANEL
ANION GAP: 6 (ref 5–15)
BUN: 15 mg/dL (ref 6–20)
CO2: 28 mmol/L (ref 22–32)
Calcium: 8.9 mg/dL (ref 8.9–10.3)
Chloride: 107 mmol/L (ref 101–111)
Creatinine, Ser: 0.97 mg/dL (ref 0.61–1.24)
Glucose, Bld: 91 mg/dL (ref 65–99)
POTASSIUM: 4.2 mmol/L (ref 3.5–5.1)
SODIUM: 141 mmol/L (ref 135–145)

## 2016-11-22 ENCOUNTER — Encounter (HOSPITAL_COMMUNITY)
Admission: RE | Admit: 2016-11-22 | Discharge: 2016-11-22 | Disposition: A | Discharge status: Home / Self Care | Payer: Medicare Other | Source: Skilled Nursing Facility | Attending: Internal Medicine | Admitting: Internal Medicine

## 2016-11-22 DIAGNOSIS — I69328 Other speech and language deficits following cerebral infarction: Secondary | ICD-10-CM | POA: Insufficient documentation

## 2016-11-22 DIAGNOSIS — Z7901 Long term (current) use of anticoagulants: Secondary | ICD-10-CM | POA: Insufficient documentation

## 2016-11-22 DIAGNOSIS — R279 Unspecified lack of coordination: Secondary | ICD-10-CM | POA: Insufficient documentation

## 2016-11-22 DIAGNOSIS — M6281 Muscle weakness (generalized): Secondary | ICD-10-CM | POA: Insufficient documentation

## 2016-11-22 DIAGNOSIS — R1312 Dysphagia, oropharyngeal phase: Secondary | ICD-10-CM | POA: Insufficient documentation

## 2016-11-22 DIAGNOSIS — I10 Essential (primary) hypertension: Secondary | ICD-10-CM | POA: Insufficient documentation

## 2016-11-22 LAB — BASIC METABOLIC PANEL
Anion gap: 9 (ref 5–15)
BUN: 18 mg/dL (ref 6–20)
CHLORIDE: 106 mmol/L (ref 101–111)
CO2: 27 mmol/L (ref 22–32)
Calcium: 9.1 mg/dL (ref 8.9–10.3)
Creatinine, Ser: 0.98 mg/dL (ref 0.61–1.24)
GFR calc Af Amer: >60 mL/min (ref >60)
GFR calc non Af Amer: >60 mL/min (ref >60)
Glucose, Bld: 83 mg/dL (ref 65–99)
POTASSIUM: 4 mmol/L (ref 3.5–5.1)
Sodium: 142 mmol/L (ref 135–145)

## 2016-11-22 LAB — PROTIME-INR
INR: 2.87
Prothrombin Time: 30.7 seconds — ABNORMAL HIGH (ref 11.4–15.2)

## 2016-11-23 ENCOUNTER — Encounter: Payer: Self-pay | Admitting: Internal Medicine

## 2016-11-23 ENCOUNTER — Non-Acute Institutional Stay (SKILLED_NURSING_FACILITY): Payer: Medicare Other | Admitting: Internal Medicine

## 2016-11-23 ENCOUNTER — Other Ambulatory Visit (HOSPITAL_COMMUNITY)
Admission: AD | Admit: 2016-11-23 | Discharge: 2016-11-23 | Disposition: A | Discharge status: Home / Self Care | Payer: Medicare Other | Source: Skilled Nursing Facility | Attending: Internal Medicine | Admitting: Internal Medicine

## 2016-11-23 DIAGNOSIS — J441 Chronic obstructive pulmonary disease with (acute) exacerbation: Secondary | ICD-10-CM | POA: Diagnosis not present

## 2016-11-23 DIAGNOSIS — K562 Volvulus: Secondary | ICD-10-CM | POA: Diagnosis not present

## 2016-11-23 DIAGNOSIS — R197 Diarrhea, unspecified: Secondary | ICD-10-CM

## 2016-11-23 DIAGNOSIS — I639 Cerebral infarction, unspecified: Secondary | ICD-10-CM | POA: Insufficient documentation

## 2016-11-23 LAB — C DIFFICILE QUICK SCREEN W PCR REFLEX
C DIFFICILE (CDIFF) INTERP: NOT DETECTED
C DIFFICILE (CDIFF) TOXIN: NEGATIVE
C DIFFICLE (CDIFF) ANTIGEN: NEGATIVE

## 2016-11-23 NOTE — Progress Notes (Signed)
Location:   [facility] Room Number: 112/W Place of Service:  SNF [phone]) Provider:  Sabino Dick, MD  [name], [name]  Extended Emergency Contact Information Primary Emergency Contact: [name] Address: [address]          [place], [place] [address] [place] of [place] Home Phone: [phone] Mobile Phone: [phone] Relation: Brother  Code Status:  Full Code Goals of care: Advanced Directive information Advanced Directives 11/08/2016  Does Patient Have a Medical Advance Directive? Yes  Type of Advance Directive (No Data)  Does patient want to make changes to medical advance directive? No - Patient declined  Copy of Healthcare Power of Attorney in Chart? -  Would patient like information on creating a medical advance directive? No - Patient declined     Chief Complaint  Patient presents with  . Acute Visit    Diarrhea    HPI:  Pt is a 71 y.o. male seen today for an acute visit for Complaints of diarrhea.  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  He was evaluated by surgery and he refused surgery and surgeons thought that he would be a poor surgical candidate.  He was managed conservatively by Dr. Margretta Sidle had a rectal tube placed as needed for decompression and was maintained on MiraLAX his lactulose was discontinued he is now onsennosides-docusate--once a day-in addition to the MiraLAX once a day.  He has enjoyed now. If stability which is encouraging-however he is complaining of diarrhea-it appears he is somewhat irritated with the diarrhea-he says he does not have any abdominal pain  his appetite appears to be good but he is bothered by the diarrhea.  He does have a history of significant hypokalemia this is been replenished aggressively and this appears to have normalize recently he is now on 40 mEq twice a day potassium and he has another metabolic panel scheduled for tomorrow-in regards to Coumadin his INR yesterday was 2.87 we reduced his dose slightly secondary to it rising and we have an update INR scheduled for tomorrow as well.  Currently he is back to his baseline vital signs appear to be stable he is afebrile is ambulating in the wheelchair in the hallway which he usually does at baseline   Past Medical History:  Diagnosis Date  . Dysphasia   . Fatty liver   . Hemiplegia (HCC)   . Hypertension   . Pneumonia   . Pulmonary embolism (HCC)   . Stroke [facility])    Past Surgical History:  Procedure Laterality Date  . FLEXIBLE SIGMOIDOSCOPY N/A 10/21/2016   Procedure: FLEXIBLE SIGMOIDOSCOPY;  Surgeon: Malissa Hippo, MD;  [facility];  Service: Endoscopy;  Laterality: N/A;  . FLEXIBLE SIGMOIDOSCOPY N/A 10/25/2016   Procedure: FLEXIBLE SIGMOIDOSCOPY;  Surgeon: Malissa Hippo, MD;  Location: [facility];  Service: Endoscopy;  Laterality: N/A;  . unable      Allergies  Allergen Reactions  . Penicillins     Has patient had a PCN reaction causing immediate rash, facial/tongue/throat swelling, SOB or lightheadedness with hypotension: unknown Has patient had a PCN reaction causing severe rash involving mucus membranes or skin  necrosis: unknown Has patient had a PCN reaction that required hospitalization: unknown Has patient had a PCN reaction occurring within the last 10 years: unknown If all of the above answers are "NO", then may proceed with Cephalosporin use. 5/3 Tolerated Cefepime x 1 dose in ED.     Outpatient Encounter Prescriptions as of 11/23/2016  Medication Sig  . acetaminophen (TYLENOL) 325 MG tablet Take 650 mg by mouth every 6 (six) hours  as needed for moderate pain.   Marland Kitchen betamethasone dipropionate (DIPROLENE) 0.05 % cream Apply to legs and hands. Do not apply to face groin, axilla. Try Cerave first twice a day.  . Emollient (CERAVE) CREA Apply 1 application topically daily as needed for itching once a day  . Eyelid Cleansers (SYSTANE LID WIPES EX) Apply 1 application topically 2 (two) times daily. Start occusoft lid scrubs: clean eyelids of oil and debris BID OU ( each eye ) before administering eye drops.   Marland Kitchen ipratropium (ATROVENT) 0.02 % nebulizer solution Take 0.5 mg by nebulization every 6 (six) hours.  Marland Kitchen labetalol (NORMODYNE) 200 MG tablet Take 200 mg by mouth daily. For HTN  . lisinopril (PRINIVIL,ZESTRIL) 20 MG tablet Take 20 mg by mouth daily.  Marland Kitchen loratadine (CLARITIN) 10 MG tablet Take 10 mg by mouth daily.  . polyethylene glycol (MIRALAX / GLYCOLAX) packet Take 17 g by mouth at bedtime.  . polyvinyl alcohol (LIQUIFILM TEARS) 1.4 % ophthalmic solution Place 1 drop into both eyes 2 (two) times daily.  . potassium chloride (K-DUR,KLOR-CON) 10 MEQ tablet Take 40 mEq by mouth 2 (two) times daily.   . sennosides-docusate sodium (SENOKOT-S) 8.6-50 MG tablet Take 1 tablet by mouth at bedtime. For constipation  . warfarin (COUMADIN) 3 MG tablet Take 3 mg by mouth daily.  Cliffton Asters Petrolatum-Mineral Oil (SYSTANE NIGHTTIME) OINT Place 1 application into the left eye at bedtime. Apply to left eye at night   . [DISCONTINUED] enoxaparin (LOVENOX) 100 MG/ML injection Inject 0.95 mLs (95 mg total) into the skin every 12 (twelve) hours.   No facility-administered encounter medications on file as of 11/23/2016.     Review of Systems   General no complaints of fever or chills.Says he feels well  Skin does not complaining of any increased bruising bleeding or rashes. Has a small open area on his right heel which is followed by nursing  Eyes does not complain of visual changes from baseline.  Resp--does not complain of shortness  breath or greatly increased cough-congestion appears improved with use of nebulizers  Cardiac no chest pain has mild lower extremity edema which is baseline.  GI Is complaining of diarrhea does not complain of any abdominal pain nausea vomiting or constipation  Muscle skeletal does not complain of joint pain .  Neurologic continues with right-sided weakness which is baseline upper and lower extremities with some contractures of his right hand-does not complain of headache dizziness or syncopal-type feelings.  Psych continues to be in good spirits pleasant does not complain of depression or anxiety  Immunization History  Administered Date(s) Administered  . Influenza-Unspecified 03/28/2014, 03/23/2016  . Pneumococcal-Unspecified 11/01/2009, 03/30/2016   Pertinent  Health Maintenance Due  Topic Date Due  . COLONOSCOPY  10/30/2025 (Originally 11/08/1995)  . INFLUENZA VACCINE  01/19/2017  . PNA vac Low Risk Adult (2 of 2 - PCV13) 03/30/2017   No flowsheet data found. Functional Status Survey:   Physical Exam Temperature is 96.6 pulse is 90 respirations are 24 blood pressure 124/71 weight is 187.2 apparently  had some fairly significant weight gain but now has lost some weight this appears to have moderated  In general this is a pleasant elderly male in no distress.  appears to be back at his baseline and ambulating with his wheelchair in the hallway  His skin is warm and dry.-Does have an ulcer on his right heel which is followed by nursing     Oropharynx clear mucous membranes moist.  Eyes continues to have opacity of left eye with history of palsyright eye acuity appears grossly intact  Chest  Somewhat shallow air entry with minimal congestion noted on exam this appears to have improved compared to previous exam there is no labored breathing   Heart is regular rate and rhythm without murmur gallop or rub has mild baseline lower extremity  edema     Abdomen soft nontender protuberant positive bowel sounds---   Muscle skeletal-continues with right-sided weakness contracture of his right hand to some extentbut is able to move right upper and lower extremities-this is baseline---strength appears preserved left upper and lower extremities is ambulatory inwheelchair.  r  Neurologic is grossly intact continues with right-sided weakness which is not new. With some dysarthric speech which is baseline  Psych he is alert and oriented pleasant and appropriate  Labs reviewed:  Recent Labs  10/29/16 0412  11/01/16 0700  11/04/16 0438  11/16/16 0715 11/20/16 0625 11/22/16 0700  NA 147*  < > 146*  < > 143  < > 140 141 142  K 2.6*  < > 2.8*  < > 2.9*  < > 4.3 4.2 4.0  CL 115*  < > 110  < > 107  < > 103 107 106  CO2 28  < > 29  < > 31  < > 30 28 27   GLUCOSE 96  < > 82  < > 90  < > 90 91 83  BUN 9  < > 14  < > 12  < > 13 15 18   CREATININE 0.89  < > 0.85  < > 0.86  < > 0.97 0.97 0.98  CALCIUM 7.9*  < > 8.1*  < > 7.9*  < > 9.0 8.9 9.1  MG 1.6*  --  1.8  --  1.6*  --   --   --   --   < > = values in this interval not displayed.  Recent Labs  10/19/16 1150 10/21/16 0914 11/03/16 0455  AST 29 38 26  ALT 39 45 42  ALKPHOS 74 59 51  BILITOT 0.7 0.7 0.4  PROT 9.0* 8.2* 6.4*  ALBUMIN 4.0 3.5 2.9*    Recent Labs  10/27/16 0527  11/01/16 0700 11/03/16 0455 11/04/16 0438 11/05/16 0416 11/06/16 0900  WBC 14.2*  < > 10.5 15.1* 14.0* 9.9 7.3  NEUTROABS 10.3*  --  7.1 11.7*  --   --   --   HGB 13.5  < > 13.2 13.5 12.7* 11.8* 13.3  HCT 41.7  < > 40.2 41.5 39.8 35.7* 40.2  MCV 91.2  < > 91.6 91.6 93.0 91.5 91.0  PLT 282  < > 310 300 264 266 256  < > = values in this interval not displayed. Lab Results  Component Value Date   TSH <0.010 (L) 10/27/2016   Lab Results  Component Value Date   HGBA1C  03/27/2009    6.0 (NOTE) The ADA recommends the following therapeutic goal for glycemic control related to Hgb  A1c measurement: Goal of therapy: <6.5 Hgb  A1c  Reference: American Diabetes Association: Clinical Practice Recommendations 2010, Diabetes Care, 2010, 33: (Suppl  1).   Lab Results  Component Value Date   CHOL 117 07/28/2016   HDL 33 (L) 07/28/2016   LDLCALC 75 07/28/2016   TRIG 45 07/28/2016   CHOLHDL 3.5 07/28/2016    Significant Diagnostic Results in last 30 days:  Ct Abdomen Pelvis Wo Contrast  Result Date: 10/27/2016 CLINICAL DATA:  Abdominal pain and distention.  Recurrent volvulus. EXAM: CT ABDOMEN AND PELVIS WITHOUT CONTRAST TECHNIQUE: Multidetector CT imaging of the abdomen and pelvis was performed following the standard protocol without IV contrast. COMPARISON:  10/21/2016 FINDINGS: Lower chest: Atelectasis or consolidation in the lung bases, greater on the right. Can't exclude pneumonia. Coronary artery and aortic calcifications. Hepatobiliary: No focal liver abnormality is seen. No gallstones, gallbladder wall thickening, or biliary dilatation. Pancreas: Unremarkable. No pancreatic ductal dilatation or surrounding inflammatory changes. Spleen: Normal in size without focal abnormality. Adrenals/Urinary Tract: Adrenal glands are unremarkable. Kidneys are normal, without renal calculi, focal lesion, or hydronephrosis. Bladder is unremarkable. Stomach/Bowel: Stomach and small bowel are decompressed. There is prominent gaseous distention of the colon with scattered stool throughout the colon. Contrast material was instilled through a rectal tube. There is only a small amount of contrast material in the colon. There is a fairly tight area of narrowing at the rectosigmoid junction through which the catheter passes. The rectum itself is decompressed. Transition zone does not appear to be acutely twisted at this time and may have been reduced by the rectal catheter. There is suggestion of a stricture at this location with prominent proximal distention similar to previous study. No discrete mass  lesion is appreciated. Vascular/Lymphatic: Aortic atherosclerosis. No enlarged abdominal or pelvic lymph nodes. Reproductive: Prostate is unremarkable. Other: No free air or free fluid in the abdomen. Moderate-sized periumbilical hernia containing fat. No bowel herniation. Musculoskeletal: Degenerative changes in the spine. No destructive bone lesions. IMPRESSION: Prominent gaseous distention of the colon to the rectosigmoid junction where there appears to be a focal stricture. No acute twisting at the level of the stricture at this time. A rectal catheter was placed for contrast administration in the catheter passes through the stricture into the dilated colon. The rectum is decompressed. Electronically Signed   By: Burman Nieves M.D.   On: 10/27/2016 06:43   Dg Abd 1 View  Result Date: 11/05/2016 CLINICAL DATA:  Recent sigmoid volvulus EXAM: ABDOMEN - 1 VIEW COMPARISON:  Nov 03, 2016 FINDINGS: The previous sigmoid volvulus is no longer evident. There is mild dilatation of several loops of colon which may indicate a degree of colonic ileus. No free air. No air-fluid levels. There are phleboliths in the pelvis. IMPRESSION: Suspect a degree of colonic ileus. No frank volvulus evident by radiography. No bowel obstruction or free air apparent. Electronically Signed   By: Bretta Bang III M.D.   On: 11/05/2016 10:37   Dg Abd 1 View  Result Date: 11/03/2016 CLINICAL DATA:  Abdominal bloating. EXAM: ABDOMEN - 1 VIEW COMPARISON:  CT scan and radiographs of same day. FINDINGS: There remains severe dilatation of the sigmoid colon consistent with sigmoid volvulus as described on prior CT scan. No small bowel dilatation is noted. Rectal tube is noted. Proximal colonic dilatation noted on prior exam appears to be significantly improved. IMPRESSION: Rectal tube is noted. Proximal colonic dilatation noted on prior exam appears to be significantly improved, although there is continued severe dilatation the sigmoid  colon consistent with sigmoid volvulus  as described on prior CT scan. Electronically Signed   By: Lupita Raider, M.D.   On: 11/03/2016 10:54   Dg Abd 1 View  Result Date: 10/28/2016 CLINICAL DATA:  Abdominal distension. EXAM: ABDOMEN - 1 VIEW COMPARISON:  10/27/2016 FINDINGS: There is persistent prominent gaseous distension of the sigmoid colon, estimated at 13 cm maximal diameter. The maximal of the sigmoid colon diameter has not significantly changed from yesterday's radiographs, however the descending and more proximal colon now appear nondilated with only a relatively small amount of gas present. Limited assessment for free air on this supine study. No dilated small bowel loops are seen. Bibasilar lung opacities are again partially visualized. Lumbar spondylosis is noted. IMPRESSION: Persistent gaseous distension of the sigmoid colon. Interval resolution of more proximal colonic distention. Electronically Signed   By: Sebastian Ache M.D.   On: 10/28/2016 08:08   Dg Abd 1 View  Result Date: 10/25/2016 CLINICAL DATA:  Status post sigmoid volvulus decompression. EXAM: ABDOMEN - 1 VIEW COMPARISON:  10/23/2016.  Scout image from abdomen CT of 10/31/2016. FINDINGS: Diffuse gaseous colonic distention persists but appears slightly improved in the interval. Degenerative changes noted lumbar spine. Bones are demineralized. Phleboliths are seen over the anatomic pelvis. IMPRESSION: Persistent but slightly decreased diffuse gaseous colonic dilatation. Electronically Signed   By: Kennith Center M.D.   On: 10/25/2016 18:34   Dg Abd Acute W/chest  Result Date: 11/03/2016 CLINICAL DATA:  Abdominal pain for 2 days.  Abdominal distension. EXAM: DG ABDOMEN ACUTE W/ 1V CHEST COMPARISON:  Abdominal radiograph 10/28/2016, abdominal CT 10/27/2016 FINDINGS: Very low lung volumes with bibasilar atelectasis. Heart size accentuated by low lung volumes. There is tortuosity of the thoracic aorta. No large pleural effusion. Diffuse  gas just distention of colon, increased colonic distention from prior exam. Sigmoid distention is similar to prior exam. No evidence of free air. No small bowel dilatation. No radiopaque calculi. No acute osseous abnormalities are seen. IMPRESSION: Progressive proximal colonic distention, significant sigmoid distention is similar. Bowel gas pattern appears similar to CT 10/27/2016. No evidence of free air. Low lung volumes with bibasilar atelectasis. Electronically Signed   By: Rubye Oaks M.D.   On: 11/03/2016 06:06   Dg Abdomen Acute W/chest  Result Date: 10/27/2016 CLINICAL DATA:  Abdominal pain and distention.  History of volvulus. EXAM: DG ABDOMEN ACUTE W/ 1V CHEST COMPARISON:  10/25/2016 FINDINGS: Shallow inspiration with linear atelectasis in the lung bases. Heart size and pulmonary vascularity are normal for technique. No pneumothorax. Calcified aorta. Diffuse gaseous distention of the colon is similar to previous study. 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is not excluded. No free intra-abdominal air is demonstrated. No radiopaque stones. Calcified phleboliths in the pelvis. Degenerative changes in the spine. IMPRESSION: Shallow inspiration with atelectasis in the lung bases. Persistent diffuse gaseous distention of the colon similar to previous studies. Electronically Signed   By: Burman Nieves M.D.   On: 10/27/2016 04:41   Dg Swallowing Func-speech Pathology  Result Date: 10/28/2016 Objective Swallowing Evaluation: Type of Study: MBS-Modified Barium Swallow Study Patient Details Name: JAVAD SALVA MRN: [id number] Date of Birth: 02-17-1946 Today's Date: 10/28/2016 Time: SLP Start Time (ACUTE ONLY): 1704-SLP Stop Time (ACUTE ONLY): 1736 SLP Time Calculation (min) (ACUTE ONLY): 32 min Past Medical History: Past Medical History: Diagnosis Date . Dysphasia  . Fatty liver  . Hemiplegia (HCC)  . Hypertension  . Pneumonia  . Pulmonary embolism (HCC)  . Stroke [facility])  Past Surgical History: Past Surgical History:  Procedure Laterality Date .  FLEXIBLE SIGMOIDOSCOPY N/A 10/21/2016  Procedure: FLEXIBLE SIGMOIDOSCOPY;  Surgeon: Malissa Hippo, MD;  [facility];  Service: Endoscopy;  Laterality: N/A; . unable   HPI: Yaqub Arney Fritsch is an 72 y.o. 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  He is unlikely to require surgery, but he did have aspiration yesterday and was made NPO pending speech re evaluation.   Subjective: "Ok" Assessment / Plan / Recommendation CHL IP CLINICAL IMPRESSIONS 10/28/2016 Clinical Impression Pt presents with moderate oral phase dysphagia with reduced lip closure resulting in labial spillage with liquids, left buccal and lingual weakness resulting in decreased oral bolus cohesiveness with premature spillage of liquids; moderate pharyngeal phase dysphagia characterized by delay in swallow initiation with swallow trigger after filling the valleculae and spilling to pyriforms across consistencies and textures, reduced tongue base retraction, and reduced laryngeal closure resulting in penetration with nectars before and during the swallow as well as gross, silent aspiration of nectars after the swallow (delayed cough eventually elicited). SLP instructed Pt to cough and he was able to produce strong cough, however it is unlikely that he was able to remove all of aspirated material. Pt with improved safety with honey-thick liquids (no penetration or aspiration) and puree. Swallow trigger was at the level of the pyriforms with mechanical soft textures. Pt with mild vallecular and pyriform residue across consistencies/textures and benefited from repeat/dry swallow after initial bite/sip. Recommend D1/puree with HTL via cup sips. Crush meds as able in puree and no straws. Pt will need 100% supervision with all po intake. Recommend f/u at SNF for diet  tolerance and reinforcement of strategies.  SLP Visit Diagnosis Dysphagia, oropharyngeal phase (R13.12) Attention and concentration deficit following -- Frontal lobe and executive function deficit following -- Impact on safety and function Moderate aspiration risk   CHL IP TREATMENT RECOMMENDATION 10/28/2016 Treatment Recommendations Defer treatment plan to f/u with SLP   Prognosis 10/28/2016 Prognosis for Safe Diet Advancement Guarded Barriers to Reach Goals Time post onset;Severity of deficits Barriers/Prognosis Comment -- CHL IP DIET RECOMMENDATION 10/28/2016 SLP Diet Recommendations Dysphagia 1 (Puree) solids;Honey thick liquids Liquid Administration via Cup;No straw Medication Administration Crushed with puree Compensations Slow rate;Multiple dry swallows after each bite/sip;Clear throat intermittently Postural Changes Remain semi-upright after after feeds/meals (Comment);Seated upright at 90 degrees   CHL IP OTHER RECOMMENDATIONS 10/28/2016 Recommended Consults -- Oral Care Recommendations Oral care BID;Staff/trained caregiver to provide oral care Other Recommendations Order thickener from pharmacy;Prohibited food (jello, ice cream, thin soups);Clarify dietary restrictions   CHL IP FOLLOW UP RECOMMENDATIONS 10/28/2016 Follow up Recommendations Skilled Nursing facility   [facility] IP FREQUENCY AND DURATION 10/22/2016 Speech Therapy Frequency (ACUTE ONLY) min 2x/week Treatment Duration 2 weeks      CHL IP ORAL PHASE 10/28/2016 Oral Phase Impaired Oral - Pudding Teaspoon -- Oral - Pudding Cup -- Oral - Honey Teaspoon -- Oral - Honey Cup -- Oral - Nectar Teaspoon -- Oral - Nectar Cup Lingual/palatal residue;Piecemeal swallowing;Decreased bolus cohesion;Premature spillage Oral - Nectar Straw -- Oral - Thin Teaspoon -- Oral - Thin Cup -- Oral - Thin Straw -- Oral - Puree -- Oral - Mech Soft Impaired mastication;Piecemeal swallowing;Delayed oral transit;Decreased bolus cohesion Oral - Regular -- Oral - Multi-Consistency -- Oral -  Pill -- Oral Phase - Comment --  CHL IP PHARYNGEAL PHASE 10/28/2016 Pharyngeal Phase Impaired Pharyngeal- Pudding Teaspoon -- Pharyngeal --  Pharyngeal- Pudding Cup -- Pharyngeal -- Pharyngeal- Honey Teaspoon -- Pharyngeal -- Pharyngeal- Honey Cup Delayed swallow initiation-vallecula;Pharyngeal residue - valleculae;Reduced tongue base retraction Pharyngeal -- Pharyngeal- Nectar Teaspoon -- Pharyngeal -- Pharyngeal- Nectar Cup Delayed swallow initiation-vallecula;Delayed swallow initiation-pyriform sinuses;Reduced epiglottic inversion;Reduced airway/laryngeal closure;Reduced tongue base retraction;Penetration/Aspiration before swallow;Penetration/Aspiration during swallow;Penetration/Apiration after swallow;Significant aspiration (Amount);Pharyngeal residue - valleculae;Pharyngeal residue - pyriform Pharyngeal Material enters airway, passes BELOW cords without attempt by patient to eject out (silent aspiration);Material enters airway, passes BELOW cords and not ejected out despite cough attempt by patient;Material enters airway, CONTACTS cords and then ejected out Pharyngeal- Nectar Straw -- Pharyngeal -- Pharyngeal- Thin Teaspoon -- Pharyngeal -- Pharyngeal- Thin Cup -- Pharyngeal -- Pharyngeal- Thin Straw -- Pharyngeal -- Pharyngeal- Puree Delayed swallow initiation-vallecula;Reduced tongue base retraction;Pharyngeal residue - valleculae Pharyngeal -- Pharyngeal- Mechanical Soft Pharyngeal residue - valleculae;Delayed swallow initiation-pyriform sinuses;Reduced epiglottic inversion;Reduced tongue base retraction Pharyngeal -- Pharyngeal- Regular -- Pharyngeal -- Pharyngeal- Multi-consistency -- Pharyngeal -- Pharyngeal- Pill -- Pharyngeal -- Pharyngeal Comment --  CHL IP CERVICAL ESOPHAGEAL PHASE 10/28/2016 Cervical Esophageal Phase WFL Pudding Teaspoon -- Pudding Cup -- Honey Teaspoon -- Honey Cup -- Nectar Teaspoon -- Nectar Cup -- Nectar Straw -- Thin Teaspoon -- Thin Cup -- Thin Straw -- Puree -- Mechanical Soft  -- Regular -- Multi-consistency -- Pill -- Cervical Esophageal Comment -- Thank you, Havery Moros, CCC-SLP [phone] No flowsheet data found. PORTER,DABNEY 10/28/2016, 6:55 PM              Ct Renal Stone Study  Result Date: 11/03/2016 CLINICAL DATA:  Abdominal pain 2 days. Distended abdomen. History of volvulus. EXAM: CT ABDOMEN AND PELVIS WITHOUT CONTRAST TECHNIQUE: Multidetector CT imaging of the abdomen and pelvis was performed following the standard protocol without IV contrast. COMPARISON:  [facility] 11/03/2016.  CT abdomen pelvis 10/27/2016 FINDINGS: Lower chest: Mild bibasilar atelectasis and small bilateral effusions. Cardiac enlargement.  Extensive coronary calcification. Hepatobiliary: Contracted gallbladder with multiple gallstones. No gallbladder wall thickening or biliary dilatation. 15 mm hepatic cyst on the right. Pancreas: Negative Spleen: Negative Adrenals/Urinary Tract: Negative for renal mass or obstruction. Small bilateral renal calculi. Negative urinary bladder. Stomach/Bowel: Severe colonic dilatation with multiple air-fluid levels. Transition point in the rectosigmoid compatible with sigmoid volvulus. This is the same location with a similar appearance to the CT of 10/21/2016. Rectal catheter is in place. Rectum decompressed. Layering oral contrast is seen in the colon, from recent swallowing function study. Small hiatal hernia.  Negative for small bowel dilatation. Vascular/Lymphatic: Extensive atherosclerotic disease. No aortic aneurysm. Negative for lymphadenopathy. Reproductive: Normal prostate Other: Mild presacral soft tissue edema, increased from the prior study. Negative for abscess. Musculoskeletal: Disc degeneration and spondylosis in the lower lumbar spine. No acute skeletal abnormality. 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 This appearance is similar to the CT of 10/21/2016. Cholelithiasis Atherosclerotic disease.  Extensive  coronary artery calcification Small hiatal hernia Small bilateral renal calculi without renal obstruction. Electronically Signed   By: Marlan Palau M.D.   On: 11/03/2016 08:26    Assessment/Plan  #1-diarrhea--this is challenging with patient's history of sigmoid volvulus-clinically he appears to be doing quite well back at his baseline but he is bothered by the diarrhea-we'll obtain a C. difficile culture-also will order follow up with Dr. Dionicia Abler for his recommendations in regards to this-he continues on MiraLAX as well as sennosides  Dulcolax--both of these are once a day in the evening-at this point would be hesitant to be too aggressive discontinuing any of  these secondary to stability --so would appreciate Dr. Patty Sermonsehman's input-- Regards hypokalemia this is been replenished and we do have a BMP pending for tomorrow.--Also will update a CBC with differential to keep eye on his white count  Clinically he appears to be doing well.  This plan was discussed with Dr. Chales AbrahamsGupta   #2 COPD this appears improved with reinitiation of the neulizers.  #3 hypokalemia as noted above this is being supplemented has normalized update lab is pending.    [id number]

## 2016-11-24 ENCOUNTER — Encounter (HOSPITAL_COMMUNITY)
Admission: RE | Admit: 2016-11-24 | Discharge: 2016-11-24 | Disposition: A | Discharge status: Home / Self Care | Payer: Medicare Other | Source: Skilled Nursing Facility | Attending: Internal Medicine | Admitting: Internal Medicine

## 2016-11-24 DIAGNOSIS — M6281 Muscle weakness (generalized): Secondary | ICD-10-CM | POA: Insufficient documentation

## 2016-11-24 DIAGNOSIS — I10 Essential (primary) hypertension: Secondary | ICD-10-CM | POA: Insufficient documentation

## 2016-11-24 DIAGNOSIS — R1312 Dysphagia, oropharyngeal phase: Secondary | ICD-10-CM | POA: Insufficient documentation

## 2016-11-24 DIAGNOSIS — I69328 Other speech and language deficits following cerebral infarction: Secondary | ICD-10-CM | POA: Insufficient documentation

## 2016-11-24 DIAGNOSIS — R279 Unspecified lack of coordination: Secondary | ICD-10-CM | POA: Insufficient documentation

## 2016-11-24 LAB — CBC WITH DIFFERENTIAL/PLATELET
BASOS ABS: 0 10*3/uL (ref 0.0–0.1)
Basophils Relative: 0 %
EOS PCT: 7 %
Eosinophils Absolute: 0.6 10*3/uL (ref 0.0–0.7)
HEMATOCRIT: 44.3 % (ref 39.0–52.0)
HEMOGLOBIN: 14.4 g/dL (ref 13.0–17.0)
LYMPHS ABS: 2.5 10*3/uL (ref 0.7–4.0)
LYMPHS PCT: 29 %
MCH: 30 pg (ref 26.0–34.0)
MCHC: 32.5 g/dL (ref 30.0–36.0)
MCV: 92.3 fL (ref 78.0–100.0)
Monocytes Absolute: 0.6 10*3/uL (ref 0.1–1.0)
Monocytes Relative: 7 %
NEUTROS ABS: 5 10*3/uL (ref 1.7–7.7)
NEUTROS PCT: 57 %
PLATELETS: 308 10*3/uL (ref 150–400)
RBC: 4.8 MIL/uL (ref 4.22–5.81)
RDW: 14.7 % (ref 11.5–15.5)
WBC: 8.6 10*3/uL (ref 4.0–10.5)

## 2016-11-24 LAB — BASIC METABOLIC PANEL
ANION GAP: 6 (ref 5–15)
BUN: 15 mg/dL (ref 6–20)
CO2: 30 mmol/L (ref 22–32)
CREATININE: 0.94 mg/dL (ref 0.61–1.24)
Calcium: 9.1 mg/dL (ref 8.9–10.3)
Chloride: 104 mmol/L (ref 101–111)
GFR calc non Af Amer: >60 mL/min (ref >60)
Glucose, Bld: 82 mg/dL (ref 65–99)
Potassium: 4.3 mmol/L (ref 3.5–5.1)
SODIUM: 140 mmol/L (ref 135–145)

## 2016-11-24 LAB — PROTIME-INR
INR: 2.98
Prothrombin Time: 31.6 seconds — ABNORMAL HIGH (ref 11.4–15.2)

## 2016-11-26 ENCOUNTER — Encounter (HOSPITAL_COMMUNITY)
Admission: RE | Admit: 2016-11-26 | Discharge: 2016-11-26 | Disposition: A | Discharge status: Home / Self Care | Payer: Medicare Other | Source: Skilled Nursing Facility | Attending: Internal Medicine | Admitting: Internal Medicine

## 2016-11-26 DIAGNOSIS — R1312 Dysphagia, oropharyngeal phase: Secondary | ICD-10-CM | POA: Insufficient documentation

## 2016-11-26 DIAGNOSIS — M6281 Muscle weakness (generalized): Secondary | ICD-10-CM | POA: Insufficient documentation

## 2016-11-26 DIAGNOSIS — I10 Essential (primary) hypertension: Secondary | ICD-10-CM | POA: Insufficient documentation

## 2016-11-26 DIAGNOSIS — I69328 Other speech and language deficits following cerebral infarction: Secondary | ICD-10-CM | POA: Insufficient documentation

## 2016-11-26 DIAGNOSIS — R279 Unspecified lack of coordination: Secondary | ICD-10-CM | POA: Insufficient documentation

## 2016-11-26 LAB — BASIC METABOLIC PANEL
Anion gap: 6 (ref 5–15)
BUN: 15 mg/dL (ref 6–20)
CO2: 28 mmol/L (ref 22–32)
CREATININE: 0.93 mg/dL (ref 0.61–1.24)
Calcium: 8.8 mg/dL — ABNORMAL LOW (ref 8.9–10.3)
Chloride: 104 mmol/L (ref 101–111)
GFR calc Af Amer: >60 mL/min (ref >60)
GLUCOSE: 86 mg/dL (ref 65–99)
POTASSIUM: 4.1 mmol/L (ref 3.5–5.1)
SODIUM: 138 mmol/L (ref 135–145)

## 2016-11-26 LAB — PROTIME-INR
INR: 2.66
Prothrombin Time: 28.9 seconds — ABNORMAL HIGH (ref 11.4–15.2)

## 2016-11-29 ENCOUNTER — Encounter (HOSPITAL_COMMUNITY)
Admission: RE | Admit: 2016-11-29 | Discharge: 2016-11-29 | Disposition: A | Discharge status: Home / Self Care | Payer: Medicare Other | Source: Skilled Nursing Facility | Attending: *Deleted | Admitting: *Deleted

## 2016-11-29 LAB — PROTIME-INR
INR: 2.25
Prothrombin Time: 25.3 seconds — ABNORMAL HIGH (ref 11.4–15.2)

## 2016-12-06 ENCOUNTER — Encounter (HOSPITAL_COMMUNITY)
Admission: RE | Admit: 2016-12-06 | Discharge: 2016-12-06 | Disposition: A | Discharge status: Home / Self Care | Payer: Medicare Other | Source: Skilled Nursing Facility | Attending: *Deleted | Admitting: *Deleted

## 2016-12-06 LAB — PROTIME-INR
INR: 1.86
PROTHROMBIN TIME: 21.7 s — AB (ref 11.4–15.2)

## 2016-12-07 ENCOUNTER — Encounter: Payer: Self-pay | Admitting: Internal Medicine

## 2016-12-07 ENCOUNTER — Non-Acute Institutional Stay (SKILLED_NURSING_FACILITY): Payer: Medicare Other | Admitting: Internal Medicine

## 2016-12-07 DIAGNOSIS — J441 Chronic obstructive pulmonary disease with (acute) exacerbation: Secondary | ICD-10-CM

## 2016-12-07 DIAGNOSIS — K562 Volvulus: Secondary | ICD-10-CM

## 2016-12-07 DIAGNOSIS — I2782 Chronic pulmonary embolism: Secondary | ICD-10-CM

## 2016-12-07 DIAGNOSIS — I10 Essential (primary) hypertension: Secondary | ICD-10-CM

## 2016-12-07 NOTE — Progress Notes (Signed)
Location:   [facility] Room Number: 112/W Place of Service:  SNF [phone]) Provider:  Lenon Curt, Freddie Breech, MD  [name], [name]  Extended Emergency Contact Information Primary Emergency Contact: [name] Address: [address]          [place], [place] [address] [place] of [place] Home Phone: [phone] Mobile Phone: [phone] Relation: Brother  Code Status:  Full Code Goals of care: Advanced Directive information Advanced Directives 12/07/2016  Does Patient Have a Medical Advance Directive? Yes  Type of Advance Directive (No Data)  Does patient want to make changes to medical advance directive? No - Patient declined  Copy of Healthcare Power of Attorney in Chart? -  Would patient like information on creating a medical advance directive? No - Patient declined     Chief Complaint  Patient presents with  . Medical Management of Chronic Issues    Routine Visit    HPI:  Pt is a 71 y.o. male seen today for medical management of chronic diseases.   Patient is Long term Resident of the SNF. 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 His last admission was in 11/08/16. And he has been doing well in facility since then. He Denies any Abdominal pain. No distension. No Nausea or vomiting. His only Complain today is Diarrhea. He says he is going to the bathroom 2-3 times a adya and wants to know if something can be done for that. His breathing is staying stable. Denies any cough or SOB. Doing well with his Dysphagic Diet. He has lost almost 14 lbs Since 11/02/2016.He is down to 186 lbs. His appetite is good according to Nurses.  Past Medical History:  Diagnosis  Date  . Dysphasia   . Fatty liver   . Hemiplegia (HCC)   . Hypertension   . Pneumonia   . Pulmonary embolism (HCC)   . Stroke [facility])    Past Surgical History:  Procedure Laterality Date  . FLEXIBLE SIGMOIDOSCOPY N/A 10/21/2016   Procedure: FLEXIBLE SIGMOIDOSCOPY;  Surgeon: Malissa Hippo, MD;  [facility];  Service: Endoscopy;  Laterality: N/A;  . FLEXIBLE SIGMOIDOSCOPY N/A 10/25/2016   Procedure: FLEXIBLE SIGMOIDOSCOPY;  Surgeon: Malissa Hippo, MD;  Location: [facility];  Service: Endoscopy;  Laterality: N/A;  . unable      Allergies  Allergen Reactions  . Penicillins     Has patient had a PCN reaction causing immediate rash, facial/tongue/throat swelling, SOB or lightheadedness with hypotension: unknown Has patient had a PCN reaction causing severe rash involving mucus membranes or skin necrosis: unknown Has patient had a PCN reaction that required hospitalization: unknown Has patient had a PCN reaction occurring within the last 10 years: unknown If all of the above answers are "NO", then may proceed with Cephalosporin use. 5/3 Tolerated Cefepime x 1 dose in ED.     Outpatient Encounter Prescriptions as of 12/07/2016  Medication Sig  . acetaminophen (TYLENOL) 325 MG tablet Take 650 mg by mouth every 6 (six) hours as needed for moderate pain.   Marland Kitchen betamethasone dipropionate (DIPROLENE) 0.05 % cream Apply to legs and hands. Do not apply to face groin, axilla. Try Cerave first twice a day.  . Emollient (CERAVE) CREA Apply  1 application topically daily as needed for itching once a day  . Eyelid Cleansers (SYSTANE LID WIPES EX) Apply 1 application topically 2 (two) times daily. Start occusoft lid scrubs: clean eyelids of oil and debris BID OU ( each eye ) before administering eye drops.   Marland Kitchen. ipratropium (ATROVENT) 0.02 % nebulizer solution Take 0.5 mg by nebulization every 6 (six) hours.  Marland Kitchen. labetalol (NORMODYNE) 200 MG tablet Take 200 mg by mouth daily. For HTN  .  lisinopril (PRINIVIL,ZESTRIL) 20 MG tablet Take 20 mg by mouth daily.  Marland Kitchen. loratadine (CLARITIN) 10 MG tablet Take 10 mg by mouth daily.  . polyethylene glycol (MIRALAX / GLYCOLAX) packet Take 17 g by mouth at bedtime.  . polyvinyl alcohol (LIQUIFILM TEARS) 1.4 % ophthalmic solution Place 1 drop into both eyes 2 (two) times daily.  . potassium chloride (K-DUR,KLOR-CON) 10 MEQ tablet Take 40 mEq by mouth 2 (two) times daily.   . sennosides-docusate sodium (SENOKOT-S) 8.6-50 MG tablet Take 1 tablet by mouth at bedtime. For constipation  . warfarin (COUMADIN) 2.5 MG tablet Take 2.5 mg by mouth daily.  Cliffton Asters. White Petrolatum-Mineral Oil (SYSTANE NIGHTTIME) OINT Place 1 application into the left eye at bedtime. Apply to left eye at night   . [DISCONTINUED] warfarin (COUMADIN) 3 MG tablet Take 3 mg by mouth daily.   No facility-administered encounter medications on file as of 12/07/2016.      Review of Systems  Review of Systems  Constitutional: Negative for activity change, appetite change, chills, diaphoresis, fatigue and fever.  HENT: Negative for mouth sores, postnasal drip, rhinorrhea, sinus pain and sore throat.   Respiratory: Negative for apnea, cough, chest tightness, shortness of breath and wheezing.   Cardiovascular: Negative for chest pain, palpitations and leg swelling.  Gastrointestinal: Negative for abdominal distention, abdominal pain, nausea and vomiting.  Genitourinary: Negative for dysuria and frequency.  Musculoskeletal: Negative for arthralgias, joint swelling and myalgias.  Skin: Negative for rash.  Neurological: Negative for dizziness, syncope, weakness, light-headedness and numbness.  Psychiatric/Behavioral: Negative for behavioral problems, confusion and sleep disturbance.    Immunization History  Administered Date(s) Administered  . Influenza-Unspecified 03/28/2014, 03/23/2016  . Pneumococcal-Unspecified 11/01/2009, 03/30/2016   Pertinent  Health Maintenance Due  Topic  Date Due  . COLONOSCOPY  10/30/2025 (Originally 11/08/1995)  . INFLUENZA VACCINE  01/19/2017  . PNA vac Low Risk Adult (2 of 2 - PCV13) 03/30/2017   No flowsheet data found. Functional Status Survey:    Vitals:   12/06/16 0928  BP: 115/75  Pulse: 88  Resp: (!) 28  Temp: 97.2 F (36.2 C)  TempSrc: Oral  SpO2: 92%  Weight: 186 lb 6.4 oz (84.6 kg)  Height: 5' 8.5" (1.74 m)   Body mass index is 27.93 kg/m. Physical Exam  Constitutional: He is oriented to person, place, and time. He appears well-developed and well-nourished.  HENT:  Head: Normocephalic.  Mouth/Throat: Oropharynx is clear and moist.  Eyes: Pupils are equal, round, and reactive to light.  Neck: Neck supple.  Cardiovascular: Normal rate, regular rhythm and normal heart sounds.   No murmur heard. Pulmonary/Chest: Effort normal and breath sounds normal. No respiratory distress. He has no wheezes. He has no rales.  Abdominal: Soft. Bowel sounds are normal. He exhibits no distension. There is no tenderness. There is no rebound.  Musculoskeletal: He exhibits no edema.  Neurological: He is alert and oriented to person, place, and time.  Skin: Skin is warm and dry.  Psychiatric: He has a normal mood  and affect. His behavior is normal.    Labs reviewed:  Recent Labs  10/29/16 0412  11/01/16 0700  11/04/16 0438  11/22/16 0700 11/24/16 0718 11/26/16 0700  NA 147*  < > 146*  < > 143  < > 142 140 138  K 2.6*  < > 2.8*  < > 2.9*  < > 4.0 4.3 4.1  CL 115*  < > 110  < > 107  < > 106 104 104  CO2 28  < > 29  < > 31  < > 27 30 28   GLUCOSE 96  < > 82  < > 90  < > 83 82 86  BUN 9  < > 14  < > 12  < > 18 15 15   CREATININE 0.89  < > 0.85  < > 0.86  < > 0.98 0.94 0.93  CALCIUM 7.9*  < > 8.1*  < > 7.9*  < > 9.1 9.1 8.8*  MG 1.6*  --  1.8  --  1.6*  --   --   --   --   < > = values in this interval not displayed.  Recent Labs  10/19/16 1150 10/21/16 0914 11/03/16 0455  AST 29 38 26  ALT 39 45 42  ALKPHOS 74 59 51    BILITOT 0.7 0.7 0.4  PROT 9.0* 8.2* 6.4*  ALBUMIN 4.0 3.5 2.9*    Recent Labs  11/01/16 0700 11/03/16 0455  11/05/16 0416 11/06/16 0900 11/24/16 0718  WBC 10.5 15.1*  < > 9.9 7.3 8.6  NEUTROABS 7.1 11.7*  --   --   --  5.0  HGB 13.2 13.5  < > 11.8* 13.3 14.4  HCT 40.2 41.5  < > 35.7* 40.2 44.3  MCV 91.6 91.6  < > 91.5 91.0 92.3  PLT 310 300  < > 266 256 308  < > = values in this interval not displayed. Lab Results  Component Value Date   TSH <0.010 (L) 10/27/2016   Lab Results  Component Value Date   HGBA1C  03/27/2009    6.0 (NOTE) The ADA recommends the following therapeutic goal for glycemic control related to Hgb A1c measurement: Goal of therapy: <6.5 Hgb A1c  Reference: American Diabetes Association: Clinical Practice Recommendations 2010, Diabetes Care, 2010, 33: (Suppl  1).   Lab Results  Component Value Date   CHOL 117 07/28/2016   HDL 33 (L) 07/28/2016   LDLCALC 75 07/28/2016   TRIG 45 07/28/2016   CHOLHDL 3.5 07/28/2016    Significant Diagnostic Results in last 30 days:  No results found.  Assessment/Plan  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Patient is doing well. We will continue Miralax and Senna. No changes right now. His stool was negative for C Diff. Will make appointment with Dr Karilyn Cota for follow up.  COPD He is doing well. Does not require oxygen anymore.   H/O DVT and PE On coumadin. Last INR today was 1.86  Has been stable and mostly above 2 on 2.5 mg of Coumadin. Continue to monitor INR.  S/P CVA Patient stable. BP controlled. He is now on Dyphagic Diet and doing well. LDL was 75 in 02/18.   Hypokalemia On 80 Meq QD Will repeat BMP in 2 weeks.   Decubitus ulcer of right heel, Now completely Healed.  Essential hypertension BP well controlled.  continue Labetalol and Lisinopril     Family/ staff Communication:   Labs/tests ordered:

## 2016-12-13 ENCOUNTER — Encounter (HOSPITAL_COMMUNITY)
Admission: RE | Admit: 2016-12-13 | Discharge: 2016-12-13 | Disposition: A | Discharge status: Home / Self Care | Payer: Medicare Other | Source: Skilled Nursing Facility | Attending: Pediatrics | Admitting: Pediatrics

## 2016-12-13 LAB — PROTIME-INR
INR: 1.71
PROTHROMBIN TIME: 20.3 s — AB (ref 11.4–15.2)

## 2016-12-13 NOTE — Progress Notes (Signed)
This encounter was created in error - please disregard.

## 2016-12-16 ENCOUNTER — Encounter: Payer: Self-pay | Admitting: Internal Medicine

## 2016-12-16 ENCOUNTER — Non-Acute Institutional Stay (SKILLED_NURSING_FACILITY): Payer: Medicare Other | Admitting: Internal Medicine

## 2016-12-16 DIAGNOSIS — I10 Essential (primary) hypertension: Secondary | ICD-10-CM | POA: Diagnosis not present

## 2016-12-16 DIAGNOSIS — H6123 Impacted cerumen, bilateral: Secondary | ICD-10-CM

## 2016-12-16 DIAGNOSIS — E876 Hypokalemia: Secondary | ICD-10-CM

## 2016-12-16 DIAGNOSIS — Z7901 Long term (current) use of anticoagulants: Secondary | ICD-10-CM

## 2016-12-16 NOTE — Progress Notes (Addendum)
Location:   [facility] Room Number: 112/W Place of Service:  SNF [phone]) Provider:  Sabino Dick, MD  [name], [name]  Extended Emergency Contact Information Primary Emergency Contact: [name] [name] Address: [address]          [place], [place] [address] [name] [name] of [place] Home Phone: [phone] Mobile Phone: [phone] Relation: Brother  Code Status:  Full Code Goals of care: Advanced Directive information Advanced Directives 12/16/2016  Does Patient Have a Medical Advance Directive? Yes  Type of Advance Directive (No Data)  Does patient want to make changes to medical advance directive? No - Patient declined  Copy of Healthcare Power of Attorney in Chart? -  Would patient like information on creating a medical advance directive? No - Patient declined     Chief Complaint  Patient presents with  . Acute Visit    Left ear   Says he cannot really hear out of his left ear  HPI:  Pt is a 71 y.o. male seen today for an acute visit for complaints of not being able to hear out of his left ear-she says this is been going on for a few days she does not complain of any pain here no difficulty swallowing no associated issues.  Recent other acute issue is recurrent sigmoid volvulus in a-required decompression by Dr. Karilyn Cota and recurrent admissions-was not a surgical candidate he is been managed conservatively.  After some initial rehospitalizations C appears to be stabilized now apparently is having regular bowel movements at one point had diarrhea but per nursing this has almost resolved  He also has a history COPD which is been stable CVA with right-sided hemiparesis a history of PE and DVT on chronic Coumadin and INR slowly rising at 1.71 update lab is pending.  Currently he is not complaining of any abdominal pain shortness of breath fever or chills-no complaint is he can't hear  much out of his left ear     Past Medical History:  Diagnosis Date  . Dysphasia   . Fatty liver   . Hemiplegia (HCC)   . Hypertension   . Pneumonia   . Pulmonary embolism (HCC)   . Stroke [facility])    Past Surgical History:  Procedure Laterality Date  . FLEXIBLE SIGMOIDOSCOPY N/A 10/21/2016   Procedure: FLEXIBLE SIGMOIDOSCOPY;  Surgeon: Malissa Hippo, MD;  [facility];  Service: Endoscopy;  Laterality: N/A;  . FLEXIBLE SIGMOIDOSCOPY N/A 10/25/2016   Procedure: FLEXIBLE SIGMOIDOSCOPY;  Surgeon: Malissa Hippo, MD;  [facility];  Service: Endoscopy;  Laterality: N/A;  . unable      Allergies  Allergen Reactions  . Penicillins     Has patient had a PCN reaction causing immediate rash, facial/tongue/throat swelling, SOB or lightheadedness with hypotension: unknown Has patient had a PCN reaction causing severe rash involving mucus membranes or skin necrosis: unknown Has patient had a PCN reaction that required hospitalization: unknown Has patient had a PCN reaction occurring within the last 10 years: unknown If all of the above answers are "NO", then may proceed with Cephalosporin use. 5/3 Tolerated Cefepime x 1 dose in ED.     Outpatient Encounter Prescriptions as of 12/16/2016  Medication Sig  . acetaminophen (TYLENOL) 325 MG tablet Take 650 mg by mouth every 6 (six) hours as needed for moderate pain.   Marland Kitchen betamethasone dipropionate (DIPROLENE) 0.05 % cream Apply to  legs and hands. Do not apply to face groin, axilla. Try Cerave first twice a day.  . Emollient (CERAVE) CREA Apply 1 application topically daily as needed for itching once a day  . Eyelid Cleansers (SYSTANE LID WIPES EX) Apply 1 application topically 2 (two) times daily. Start occusoft lid scrubs: clean eyelids of oil and debris BID OU ( each eye ) before administering eye drops.   Marland Kitchen ipratropium (ATROVENT) 0.02 % nebulizer solution Take 0.5 mg by nebulization every 6 (six) hours.  Marland Kitchen labetalol  (NORMODYNE) 200 MG tablet Take 200 mg by mouth daily. For HTN  . lisinopril (PRINIVIL,ZESTRIL) 20 MG tablet Take 20 mg by mouth daily.  Marland Kitchen loratadine (CLARITIN) 10 MG tablet Take 10 mg by mouth daily.  . polyethylene glycol (MIRALAX / GLYCOLAX) packet Take 17 g by mouth at bedtime.  . polyvinyl alcohol (LIQUIFILM TEARS) 1.4 % ophthalmic solution Place 1 drop into both eyes 2 (two) times daily.  . potassium chloride (K-DUR,KLOR-CON) 10 MEQ tablet Take 40 mEq by mouth 2 (two) times daily.   . sennosides-docusate sodium (SENOKOT-S) 8.6-50 MG tablet Take 1 tablet by mouth at bedtime. For constipation  . warfarin (COUMADIN) 2.5 MG tablet Give once a day on Mon,Wed, Fri, Sat 5:00 pm  . warfarin (COUMADIN) 3 MG tablet Give once a day on Sun, Tue, Thu, at 5:00 PM  . White Petrolatum-Mineral Oil (SYSTANE NIGHTTIME) OINT Place 1 application into the left eye at bedtime. Apply to left eye at night   . [DISCONTINUED] warfarin (COUMADIN) 2.5 MG tablet Take 2.5 mg by mouth daily.   No facility-administered encounter medications on file as of 12/16/2016.     Review of Systems   General no complaints of fever or chills.Says he feels well  Skin does not complaining of any increased bruising bleeding or rashes.  Eyes does not complain of visual changes from baseline.  Ears says he hears okay out of his right ear but has difficulty hearing out of his left he does not complain of any pain however  Resp--does not complain of shortness breath or greatly increased cough-congestion appears improved with use of nebulizers  Cardiac no chest pain has mild lower extremity edema which is baseline.  GI  Per  nursing diarrhea has essentially resolved is not complaining of any abdominal pain apparently is having some  regular bowel movements  Muscle skeletal does not complain of joint pain .  Neurologic continues with right-sided weakness which is baseline upper and lower extremities with some contractures  of his right hand-does not complain of headache dizziness or syncopal-type feelings.  Psych continues to be in good spirits pleasant does not complain of depression or anxiety  Immunization History  Administered Date(s) Administered  . Influenza-Unspecified 03/28/2014, 03/23/2016  . Pneumococcal-Unspecified 11/01/2009, 03/30/2016   Pertinent  Health Maintenance Due  Topic Date Due  . COLONOSCOPY  10/30/2025 (Originally 11/08/1995)  . INFLUENZA VACCINE  01/19/2017  . PNA vac Low Risk Adult (2 of 2 - PCV13) 03/30/2017   No flowsheet data found. Functional Status Survey:    Vitals:   12/16/16 1430  BP: 117/73  Pulse: 85  Resp: 18  Temp: 98.7 F (37.1 C)  TempSrc: Oral  SpO2: 96%    Physical Exam In general this is a pleasant elderly male in no distress. appears to be back at his baseline and ambulating with his wheelchair in the hallway  His skin is warm and dry.-Does have an ulcer on his right heel which is  followed by nursing     Oropharynx clear mucous membranes moist.  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  Left ear has significantly increased wax TM is partially visualized but has a significant amount of wax buildup  again do not see any sign of redness or drainage or exudate Chest  Somewhat shallow air entry with minimal congestion noted on exam this appears to have improved compared to previous exam there is no labored breathing   Heart is regular rate and rhythm without murmur gallop or rub has mild baseline lower extremity edema     Abdomen soft nontender protuberant positive bowel sounds---  Muscle skeletal-continues with right-sided weakness contracture of his right hand to some extentbut is able to move right upper and lower extremities-this is  baseline---strength appears preserved left upper and lower extremities is ambulatory inwheelchair.  r  Neurologic is grossly intact continues with right-sided weakness which is not new. With some dysarthric speech which is baseline  Psych he is alert and oriented pleasant and appropriate Labs reviewed:  Recent Labs  10/29/16 0412  11/01/16 0700  11/04/16 0438  11/22/16 0700 11/24/16 0718 11/26/16 0700  NA 147*  < > 146*  < > 143  < > 142 140 138  K 2.6*  < > 2.8*  < > 2.9*  < > 4.0 4.3 4.1  CL 115*  < > 110  < > 107  < > 106 104 104  CO2 28  < > 29  < > 31  < > 27 30 28   GLUCOSE 96  < > 82  < > 90  < > 83 82 86  BUN 9  < > 14  < > 12  < > 18 15 15   CREATININE 0.89  < > 0.85  < > 0.86  < > 0.98 0.94 0.93  CALCIUM 7.9*  < > 8.1*  < > 7.9*  < > 9.1 9.1 8.8*  MG 1.6*  --  1.8  --  1.6*  --   --   --   --   < > = values in this interval not displayed.  Recent Labs  10/19/16 1150 10/21/16 0914 11/03/16 0455  AST 29 38 26  ALT 39 45 42  ALKPHOS 74 59 51  BILITOT 0.7 0.7 0.4  PROT 9.0* 8.2* 6.4*  ALBUMIN 4.0 3.5 2.9*    Recent Labs  11/01/16 0700 11/03/16 0455  11/05/16 0416 11/06/16 0900 11/24/16 0718  WBC 10.5 15.1*  < > 9.9 7.3 8.6  NEUTROABS 7.1 11.7*  --   --   --  5.0  HGB 13.2 13.5  < > 11.8* 13.3 14.4  HCT 40.2 41.5  < > 35.7* 40.2 44.3  MCV 91.6 91.6  < > 91.5 91.0 92.3  PLT 310 300  < > 266 256 308  < > = values in this interval not displayed. Lab Results  Component Value Date   TSH <0.010 (L) 10/27/2016   Lab Results  Component Value Date   HGBA1C  03/27/2009    6.0 (NOTE) The ADA recommends the following therapeutic goal for glycemic control related to Hgb A1c measurement: Goal of therapy: <6.5 Hgb A1c  Reference: American Diabetes Association: Clinical Practice Recommendations 2010, Diabetes Care, 2010, 33: (Suppl  1).  Lab Results  Component Value Date   CHOL 117 07/28/2016   HDL 33 (L) 07/28/2016   LDLCALC 75 07/28/2016   TRIG 45  07/28/2016   CHOLHDL 3.5 07/28/2016    Significant Diagnostic Results in last 30 days:  No results found. In Assessment/Plan  1 cerumen buildup-will treat with the Brox bilaterally for 3 days and flush with warm H2O on day 4 monitor again the wax filled of this more significant on the left which I suspect is contributing to the hearing loss-monitor for improvement changes.  Number 2I do note he does have a history of hypokalemia -- it had been as low as 2.6 on May 11-he is now on aggressive potassium supplementation 40 mEq twice a day-we'll update this tomorrow to ensure stability of this has normalized and recently has been fairly consistently in the fours.  #3 hypertension this appears stable he is on Synthroid 20 mg a day. As well as labetalol 200 mg daily recent blood pressures 112/82-132/85   #4-chronic anticoagulation on Coumadin with history of DVT and PE-INR is slowly rising at 1.71 update INR is been ordered continues on alternating doses of 3-and 2.5 mg a day.--- Update INR is pending for July 2  [id number]

## 2016-12-17 ENCOUNTER — Encounter (HOSPITAL_COMMUNITY)
Admission: RE | Admit: 2016-12-17 | Discharge: 2016-12-17 | Disposition: A | Discharge status: Home / Self Care | Payer: Medicare Other | Source: Skilled Nursing Facility | Attending: Internal Medicine | Admitting: Internal Medicine

## 2016-12-17 DIAGNOSIS — I10 Essential (primary) hypertension: Secondary | ICD-10-CM | POA: Insufficient documentation

## 2016-12-17 DIAGNOSIS — R1312 Dysphagia, oropharyngeal phase: Secondary | ICD-10-CM | POA: Insufficient documentation

## 2016-12-17 DIAGNOSIS — R279 Unspecified lack of coordination: Secondary | ICD-10-CM | POA: Insufficient documentation

## 2016-12-17 DIAGNOSIS — I69328 Other speech and language deficits following cerebral infarction: Secondary | ICD-10-CM | POA: Insufficient documentation

## 2016-12-17 DIAGNOSIS — M6281 Muscle weakness (generalized): Secondary | ICD-10-CM | POA: Insufficient documentation

## 2016-12-17 LAB — BASIC METABOLIC PANEL
ANION GAP: 10 (ref 5–15)
BUN: 23 mg/dL — ABNORMAL HIGH (ref 6–20)
CALCIUM: 9 mg/dL (ref 8.9–10.3)
CO2: 26 mmol/L (ref 22–32)
CREATININE: 1.07 mg/dL (ref 0.61–1.24)
Chloride: 104 mmol/L (ref 101–111)
Glucose, Bld: 113 mg/dL — ABNORMAL HIGH (ref 65–99)
Potassium: 4.7 mmol/L (ref 3.5–5.1)
SODIUM: 140 mmol/L (ref 135–145)

## 2016-12-20 ENCOUNTER — Encounter (INDEPENDENT_AMBULATORY_CARE_PROVIDER_SITE_OTHER): Payer: Self-pay | Admitting: Internal Medicine

## 2016-12-20 ENCOUNTER — Ambulatory Visit (INDEPENDENT_AMBULATORY_CARE_PROVIDER_SITE_OTHER): Payer: Medicare Other | Admitting: Internal Medicine

## 2016-12-20 ENCOUNTER — Non-Acute Institutional Stay (SKILLED_NURSING_FACILITY): Payer: Medicare Other | Admitting: Internal Medicine

## 2016-12-20 ENCOUNTER — Encounter (HOSPITAL_COMMUNITY)
Admission: RE | Admit: 2016-12-20 | Discharge: 2016-12-20 | Disposition: A | Discharge status: Home / Self Care | Payer: Medicare Other | Source: Skilled Nursing Facility | Attending: Internal Medicine | Admitting: Internal Medicine

## 2016-12-20 ENCOUNTER — Encounter: Payer: Self-pay | Admitting: Internal Medicine

## 2016-12-20 VITALS — BP 124/84 | HR 72 | Temp 98.0°F | Ht 70.0 in | Wt 188.0 lb

## 2016-12-20 DIAGNOSIS — K562 Volvulus: Secondary | ICD-10-CM | POA: Diagnosis not present

## 2016-12-20 DIAGNOSIS — E876 Hypokalemia: Secondary | ICD-10-CM | POA: Diagnosis not present

## 2016-12-20 DIAGNOSIS — I638 Other cerebral infarction: Secondary | ICD-10-CM

## 2016-12-20 DIAGNOSIS — G8191 Hemiplegia, unspecified affecting right dominant side: Secondary | ICD-10-CM | POA: Insufficient documentation

## 2016-12-20 DIAGNOSIS — Z7901 Long term (current) use of anticoagulants: Secondary | ICD-10-CM | POA: Diagnosis not present

## 2016-12-20 DIAGNOSIS — I10 Essential (primary) hypertension: Secondary | ICD-10-CM | POA: Insufficient documentation

## 2016-12-20 DIAGNOSIS — I2782 Chronic pulmonary embolism: Secondary | ICD-10-CM | POA: Diagnosis not present

## 2016-12-20 DIAGNOSIS — Z5181 Encounter for therapeutic drug level monitoring: Secondary | ICD-10-CM | POA: Diagnosis not present

## 2016-12-20 DIAGNOSIS — I69998 Other sequelae following unspecified cerebrovascular disease: Secondary | ICD-10-CM | POA: Insufficient documentation

## 2016-12-20 DIAGNOSIS — R293 Abnormal posture: Secondary | ICD-10-CM | POA: Insufficient documentation

## 2016-12-20 LAB — PROTIME-INR
INR: 1.52
PROTHROMBIN TIME: 18.5 s — AB (ref 11.4–15.2)

## 2016-12-20 NOTE — Progress Notes (Signed)
Location:   [facility] Room Number: 112/W Place of Service:  SNF [phone]) Provider:  Sabino DickArlo Hazelyn Kallen  Gupta, Anjali L, MD  Patient Care Team: Mahlon GammonGupta, Anjali L, [name]  Extended Emergency Contact Information Primary Emergency Contact: Venetia MaxonWalker,[name] Jr Address: [address]          [place], [place] [address] [place] Home Phone: [phone] Mobile Phone: [phone] Relation: Brother  Code Status:  Full Code Goals of care: Advanced Directive information Advanced Directives 12/20/2016  Does Patient Have a Medical Advance Directive? Yes  Type of Advance Directive (No Data)  Does patient want to make changes to medical advance directive? No - Patient declined  Copy of Healthcare Power of Attorney in Chart? -  Would patient like information on creating a medical advance directive? No - Patient declined     Chief Complaint  Patient presents with  . Acute Visit    INR       Acute visit secondary to anticoagulation  Management wtthhistory of DVT pulmonary embolism andCVA  HPI:  Pt is a 71 y.o. male seen today for an acute visit for follow-up  Of anticoagulation with history as noted above.  INR has been somewhat subtherapeutic recently it is 1.52 today It was 1.71 last week.  He is on alternating doses of 2.5 and 3 mg a day.  He does not report any increased bruising or bleeding appears to be stable in this regards. 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  He also has a recent history of hypokalemia which is being supplemented and this appears to normalize the potassium of 4.7 on lab done on June 29-previously had been 4.1 and 4.3 so this has stabilized which is encouraging.      Past Medical  History:  Diagnosis Date  . Dysphasia   . Fatty liver   . Hemiplegia (HCC)   . Hypertension   . Pneumonia   . Pulmonary embolism (HCC)   . 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(HCC)   . Stroke [facility](HCC)    Past Surgical History:  Procedure Laterality Date  . FLEXIBLE SIGMOIDOSCOPY N/A 10/21/2016   Procedure: FLEXIBLE SIGMOIDOSCOPY;  Surgeon: Malissa Hippoehman, Najeeb U, MD;  [facility];  Service: Endoscopy;  Laterality: N/A;  . FLEXIBLE SIGMOIDOSCOPY N/A 10/25/2016   Procedure: FLEXIBLE SIGMOIDOSCOPY;  Surgeon: Malissa Hippoehman, Najeeb U, MD;  Location: [facility];  Service: Endoscopy;  Laterality: N/A;  . unable      Allergies  Allergen Reactions  . Penicillins     Has patient had a PCN reaction causing immediate rash, facial/tongue/throat swelling, SOB or lightheadedness with hypotension: unknown Has patient had a PCN reaction causing severe rash involving mucus membranes or skin necrosis: unknown Has patient had a PCN reaction that required hospitalization: unknown Has patient had a PCN reaction occurring within the last 10 years: unknown If all of the above answers are "NO", then may proceed with Cephalosporin use. 5/3 Tolerated Cefepime x 1 dose in ED.     Outpatient Encounter Prescriptions as of 12/20/2016  Medication Sig  . acetaminophen (TYLENOL) 325 MG tablet Take 650 mg by mouth every 6 (six) hours as needed for moderate pain.   Marland Kitchen. betamethasone dipropionate (DIPROLENE) 0.05 % cream Apply to legs and hands.  Do not apply to face groin, axilla. Try Cerave first twice a day.  . Emollient (CERAVE) CREA Apply 1 application topically daily as needed for itching once a day  . Eyelid Cleansers (SYSTANE LID WIPES EX) Apply 1 application topically 2 (two) times daily. Start occusoft lid scrubs: clean eyelids of oil and debris BID OU ( each eye ) before administering eye drops.   Marland Kitchen ipratropium (ATROVENT) 0.02 % nebulizer solution Take 0.5 mg by nebulization every 6 (six) hours.  Marland Kitchen labetalol (NORMODYNE) 200 MG  tablet Take 200 mg by mouth daily. For HTN  . lisinopril (PRINIVIL,ZESTRIL) 20 MG tablet Take 20 mg by mouth daily.  Marland Kitchen loratadine (CLARITIN) 10 MG tablet Take 10 mg by mouth daily.  . polyethylene glycol (MIRALAX / GLYCOLAX) packet Take 17 g by mouth at bedtime.  . polyvinyl alcohol (LIQUIFILM TEARS) 1.4 % ophthalmic solution Place 1 drop into both eyes 2 (two) times daily.  . potassium chloride (K-DUR,KLOR-CON) 10 MEQ tablet Take 40 mEq by mouth 2 (two) times daily.   . sennosides-docusate sodium (SENOKOT-S) 8.6-50 MG tablet Take 1 tablet by mouth at bedtime. For constipation  . warfarin (COUMADIN) 2.5 MG tablet Give once a day on Mon,Wed, Fri, Sat 5:00 pm  . warfarin (COUMADIN) 3 MG tablet Give once a day on Sun, Tue, Thu, at 5:00 PM  . White Petrolatum-Mineral Oil (SYSTANE NIGHTTIME) OINT Place 1 application into the left eye at bedtime. Apply to left eye at night    No facility-administered encounter medications on file as of 12/20/2016.     Review of Systems General no complaints of fever or chills.Says he feels well  Skin does not complaining of any increased bruising bleeding or rashes.  Eyes does not complain of visual changes from baseline.    Resp--does not complain of shortness breath or greatly increased cough-congestion appears improved with use of nebulizers  Cardiac no chest pain has mild lower extremity edema which is baseline.   GI-does not really complaining of increased diarrhea anymore but is having regular bowel movements 2-3 a day apparently-does not complain of abdominal pain nausea or vomiting  Muscle skeletal does not complain of joint pain .  Neurologic continues with right-sided weakness which is baseline upper and lower extremities with some contractures of his right hand-does not complain of headache dizziness or syncopal-type feelings.  Psych continues to be in good spirits pleasant does not complain of depression or anxiety  Immunization  History  Administered Date(s) Administered  . Influenza-Unspecified 03/28/2014, 03/23/2016  . Pneumococcal-Unspecified 11/01/2009, 03/30/2016   Pertinent  Health Maintenance Due  Topic Date Due  . COLONOSCOPY  10/30/2025 (Originally 11/08/1995)  . INFLUENZA VACCINE  01/19/2017  . PNA vac Low Risk Adult (2 of 2 - PCV13) 03/30/2017   No flowsheet data found. Functional Status Survey:    Vitals:   12/20/16 1131  BP: 110/70  Pulse: 98  Resp: 20  Temp: 97.3 F (36.3 C)  TempSrc: Oral  SpO2: 93%    Physical Exam In general this is a pleasant elderly male in no distress.  His skin is warm and dry.- I do not note any increased bruising     Oropharynx clear mucous membranes moist.  Eyes continues to have opacity of left eye with history of palsyright eye acuity appears grossly intact     Chest  Somewhat shallow airentry with minimal diffuse coarse breath sounds there is no labored breathing   Heart is regular rate and rhythm without murmur gallop  or rub has mild baseline lower extremity edema     Abdomen soft nontender protuberant positive bowel sounds---  Muscle skeletal-continues with right-sided weakness contracture of his right hand to some extentbut is able to move right upper and lower extremities-this is baseline---strength appears preserved left upper and lower extremities is ambulatory inwheelchair.  r  Neurologic is grossly intact continues with right-sided weakness which is not new. With some dysarthric speech which is baseline  Psych he is alert and oriented pleasant and appropriate Labs reviewed:  Recent Labs  10/29/16 0412  11/01/16 0700  11/04/16 0438  11/24/16 0718 11/26/16 0700 12/17/16 1105  NA 147*  < > 146*  < > 143  < > 140 138 140  K 2.6*  < > 2.8*  < > 2.9*  < > 4.3 4.1 4.7  CL 115*  < > 110  < > 107  < > 104 104 104  CO2 28  < > 29  < > 31  < > 30 28 26   GLUCOSE 96  < > 82  < > 90  < > 82 86 113*  BUN  9  < > 14  < > 12  < > 15 15 23*  CREATININE 0.89  < > 0.85  < > 0.86  < > 0.94 0.93 1.07  CALCIUM 7.9*  < > 8.1*  < > 7.9*  < > 9.1 8.8* 9.0  MG 1.6*  --  1.8  --  1.6*  --   --   --   --   < > = values in this interval not displayed.  Recent Labs  10/19/16 1150 10/21/16 0914 11/03/16 0455  AST 29 38 26  ALT 39 45 42  ALKPHOS 74 59 51  BILITOT 0.7 0.7 0.4  PROT 9.0* 8.2* 6.4*  ALBUMIN 4.0 3.5 2.9*    Recent Labs  11/01/16 0700 11/03/16 0455  11/05/16 0416 11/06/16 0900 11/24/16 0718  WBC 10.5 15.1*  < > 9.9 7.3 8.6  NEUTROABS 7.1 11.7*  --   --   --  5.0  HGB 13.2 13.5  < > 11.8* 13.3 14.4  HCT 40.2 41.5  < > 35.7* 40.2 44.3  MCV 91.6 91.6  < > 91.5 91.0 92.3  PLT 310 300  < > 266 256 308  < > = values in this interval not displayed. Lab Results  Component Value Date   TSH <0.010 (L) 10/27/2016   Lab Results  Component Value Date   HGBA1C  03/27/2009    6.0 (NOTE) The ADA recommends the following therapeutic goal for glycemic control related to Hgb A1c measurement: Goal of therapy: <6.5 Hgb A1c  Reference: American Diabetes Association: Clinical Practice Recommendations 2010, Diabetes Care, 2010, 33: (Suppl  1).   Lab Results  Component Value Date   CHOL 117 07/28/2016   HDL 33 (L) 07/28/2016   LDLCALC 75 07/28/2016   TRIG 45 07/28/2016   CHOLHDL 3.5 07/28/2016    Significant Diagnostic Results in last 30 days:  No results found.  Assessment/Plan  Assessment and plan.  #1 anticoagulation management was subtherapeutic INR-clinically appears stable will increase his Coumadin dose to 4 mg tonight and 3 mg by mouth daily and update a PT/INR on Thursday, July 5-at times he has gone supratherapeutic so will be somewhat cautious clinically he is stable.  Does have a distant history of pulmonary embolism and DVT as well as CVA-.  #2 history hypokalemia this is being supplemented currently with  potassium 40 mEq twice a day-this has normalized now 4.7-update  metabolic panel is pending for follow-up.  #3-history of sigmoid volvulus-he does have GI follow-up this has stabilized-she is having regular bowel movement laxative appears to be tolerating this well.  [id number]

## 2016-12-20 NOTE — Progress Notes (Signed)
Subjective:    Patient ID: [name] [name], male    DOB: 1945/07/23, 71 y.o.   MRN: [id number] [facility]. HPI Here today for f/u. Hx of sigmoid volvulus. Last admission for sigmoid volvulus was in May of this year. Has had several admission for same. Found not to be a surgical candidate. He states he is doing good. He is having 2-3 stools a day.  He is not having any abdominal pain.  Appetite is good.     Review of Systems Past Medical History:  Diagnosis Date  . Dysphasia   . Fatty liver   . Hemiplegia (HCC)   . Hypertension   . Pneumonia   . Pulmonary embolism (HCC)   . Stroke [facility](HCC)     Past Surgical History:  Procedure Laterality Date  . FLEXIBLE SIGMOIDOSCOPY N/A 10/21/2016   Procedure: FLEXIBLE SIGMOIDOSCOPY;  Surgeon: Malissa Hippoehman, Najeeb U, MD;  [facility];  Service: Endoscopy;  Laterality: N/A;  . FLEXIBLE SIGMOIDOSCOPY N/A 10/25/2016   Procedure: FLEXIBLE SIGMOIDOSCOPY;  Surgeon: Malissa Hippoehman, Najeeb U, MD;  [facility];  Service: Endoscopy;  Laterality: N/A;  . unable      Allergies  Allergen Reactions  . Penicillins     Has patient had a PCN reaction causing immediate rash, facial/tongue/throat swelling, SOB or lightheadedness with hypotension: unknown Has patient had a PCN reaction causing severe rash involving mucus membranes or skin necrosis: unknown Has patient had a PCN reaction that required hospitalization: unknown Has patient had a PCN reaction occurring within the last 10 years: unknown If all of the above answers are "NO", then may proceed with Cephalosporin use. 5/3 Tolerated Cefepime x 1 dose in ED.     Current Outpatient Prescriptions on File Prior to Visit  Medication Sig Dispense Refill  . acetaminophen (TYLENOL) 325 MG tablet Take 650 mg by mouth every 6 (six) hours as needed for moderate pain.     Marland Kitchen. betamethasone dipropionate (DIPROLENE) 0.05 % cream Apply to legs and hands. Do not apply to face groin, axilla. Try  Cerave first twice a day.    . Emollient (CERAVE) CREA Apply 1 application topically daily as needed for itching once a day    . Eyelid Cleansers (SYSTANE LID WIPES EX) Apply 1 application topically 2 (two) times daily. Start occusoft lid scrubs: clean eyelids of oil and debris BID OU ( each eye ) before administering eye drops.     Marland Kitchen. ipratropium (ATROVENT) 0.02 % nebulizer solution Take 0.5 mg by nebulization every 6 (six) hours.    Marland Kitchen. labetalol (NORMODYNE) 200 MG tablet Take 200 mg by mouth daily. For HTN    . lisinopril (PRINIVIL,ZESTRIL) 20 MG tablet Take 20 mg by mouth daily.    Marland Kitchen. loratadine (CLARITIN) 10 MG tablet Take 10 mg by mouth daily.    . polyethylene glycol (MIRALAX / GLYCOLAX) packet Take 17 g by mouth at bedtime.    . polyvinyl alcohol (LIQUIFILM TEARS) 1.4 % ophthalmic solution Place 1 drop into both eyes 2 (two) times daily.    . potassium chloride (K-DUR,KLOR-CON) 10 MEQ tablet Take 40 mEq by mouth 2 (two) times daily.     . sennosides-docusate sodium (SENOKOT-S) 8.6-50 MG tablet Take 1 tablet by mouth at bedtime. For constipation    . warfarin (COUMADIN) 2.5 MG tablet Give once a day on Mon,Wed, Fri, Sat 5:00 pm    . warfarin (COUMADIN) 3 MG tablet Give once a day on Sun, Tue, Thu, at  5:00 PM    . White Petrolatum-Mineral Oil (SYSTANE NIGHTTIME) OINT Place 1 application into the left eye at bedtime. Apply to left eye at night      No current facility-administered medications on file prior to visit.         Objective:   Physical Exam Blood pressure 124/84, pulse 72, height 5\' 10"  (1.778 m), weight 188 lb (85.3 kg). Patient examined from w/c. Weight is stated per patient.   Alert and oriented. Skin warm and dry. Oral mucosa is moist.   . Sclera anicteric, conjunctivae is pink. Thyroid not enlarged. No cervical lymphadenopathy. Lungs clear. Heart regular rate and rhythm.  Abdomen is soft. Bowel sounds are positive. No hepatomegaly. No abdominal masses felt. No tenderness.             Assessment & Plan:  Sigmoid volvulus. He seems to be doing well. Sitting in w/c. Abdomen is soft. Brother is present in room.  OV as needed.

## 2016-12-20 NOTE — Patient Instructions (Signed)
OV in as needed.  

## 2016-12-21 ENCOUNTER — Other Ambulatory Visit (HOSPITAL_COMMUNITY)
Admission: RE | Admit: 2016-12-21 | Discharge: 2016-12-21 | Disposition: A | Discharge status: Home / Self Care | Payer: Medicare Other | Source: Skilled Nursing Facility | Attending: Internal Medicine | Admitting: Internal Medicine

## 2016-12-21 DIAGNOSIS — R279 Unspecified lack of coordination: Secondary | ICD-10-CM | POA: Insufficient documentation

## 2016-12-21 DIAGNOSIS — M6281 Muscle weakness (generalized): Secondary | ICD-10-CM | POA: Insufficient documentation

## 2016-12-21 DIAGNOSIS — I10 Essential (primary) hypertension: Secondary | ICD-10-CM | POA: Insufficient documentation

## 2016-12-21 DIAGNOSIS — R1312 Dysphagia, oropharyngeal phase: Secondary | ICD-10-CM | POA: Insufficient documentation

## 2016-12-21 LAB — COMPREHENSIVE METABOLIC PANEL
ALK PHOS: 58 U/L (ref 38–126)
ALT: 29 U/L (ref 17–63)
AST: 23 U/L (ref 15–41)
Albumin: 3.2 g/dL — ABNORMAL LOW (ref 3.5–5.0)
Anion gap: 6 (ref 5–15)
BILIRUBIN TOTAL: 0.4 mg/dL (ref 0.3–1.2)
BUN: 28 mg/dL — AB (ref 6–20)
CHLORIDE: 106 mmol/L (ref 101–111)
CO2: 27 mmol/L (ref 22–32)
CREATININE: 1.16 mg/dL (ref 0.61–1.24)
Calcium: 8.5 mg/dL — ABNORMAL LOW (ref 8.9–10.3)
GFR calc Af Amer: >60 mL/min (ref >60)
Glucose, Bld: 88 mg/dL (ref 65–99)
Potassium: 4.7 mmol/L (ref 3.5–5.1)
Sodium: 139 mmol/L (ref 135–145)
Total Protein: 7 g/dL (ref 6.5–8.1)

## 2016-12-21 LAB — CBC WITH DIFFERENTIAL/PLATELET
Basophils Absolute: 0 10*3/uL (ref 0.0–0.1)
Basophils Relative: 0 %
Eosinophils Absolute: 0.6 10*3/uL (ref 0.0–0.7)
Eosinophils Relative: 8 %
HEMATOCRIT: 44.1 % (ref 39.0–52.0)
HEMOGLOBIN: 13.9 g/dL (ref 13.0–17.0)
LYMPHS ABS: 2.4 10*3/uL (ref 0.7–4.0)
LYMPHS PCT: 31 %
MCH: 29.4 pg (ref 26.0–34.0)
MCHC: 31.5 g/dL (ref 30.0–36.0)
MCV: 93.4 fL (ref 78.0–100.0)
MONOS PCT: 7 %
Monocytes Absolute: 0.5 10*3/uL (ref 0.1–1.0)
NEUTROS ABS: 4.2 10*3/uL (ref 1.7–7.7)
NEUTROS PCT: 54 %
Platelets: 280 10*3/uL (ref 150–400)
RBC: 4.72 MIL/uL (ref 4.22–5.81)
RDW: 14.2 % (ref 11.5–15.5)
WBC: 7.8 10*3/uL (ref 4.0–10.5)

## 2016-12-21 LAB — PROTIME-INR
INR: 1.59
Prothrombin Time: 19.1 seconds — ABNORMAL HIGH (ref 11.4–15.2)

## 2016-12-23 ENCOUNTER — Other Ambulatory Visit (HOSPITAL_COMMUNITY)
Admission: RE | Admit: 2016-12-23 | Discharge: 2016-12-23 | Disposition: A | Discharge status: Home / Self Care | Payer: Medicare Other | Source: Skilled Nursing Facility | Attending: Internal Medicine | Admitting: Internal Medicine

## 2016-12-23 DIAGNOSIS — Z7901 Long term (current) use of anticoagulants: Secondary | ICD-10-CM | POA: Insufficient documentation

## 2016-12-23 LAB — PROTIME-INR
INR: 1.83
PROTHROMBIN TIME: 21.4 s — AB (ref 11.4–15.2)

## 2016-12-27 ENCOUNTER — Encounter (HOSPITAL_COMMUNITY)
Admission: RE | Admit: 2016-12-27 | Discharge: 2016-12-27 | Disposition: A | Discharge status: Home / Self Care | Payer: Medicare Other | Source: Skilled Nursing Facility | Attending: *Deleted | Admitting: *Deleted

## 2016-12-27 LAB — PROTIME-INR
INR: 2.35
Prothrombin Time: 26.1 seconds — ABNORMAL HIGH (ref 11.4–15.2)

## 2016-12-30 ENCOUNTER — Other Ambulatory Visit (HOSPITAL_COMMUNITY)
Admission: RE | Admit: 2016-12-30 | Discharge: 2016-12-30 | Disposition: A | Discharge status: Home / Self Care | Payer: Medicare Other | Source: Skilled Nursing Facility | Attending: Internal Medicine | Admitting: Internal Medicine

## 2016-12-30 DIAGNOSIS — M6281 Muscle weakness (generalized): Secondary | ICD-10-CM | POA: Insufficient documentation

## 2016-12-30 DIAGNOSIS — R1312 Dysphagia, oropharyngeal phase: Secondary | ICD-10-CM | POA: Insufficient documentation

## 2016-12-30 DIAGNOSIS — Z7901 Long term (current) use of anticoagulants: Secondary | ICD-10-CM | POA: Insufficient documentation

## 2016-12-30 DIAGNOSIS — I69328 Other speech and language deficits following cerebral infarction: Secondary | ICD-10-CM | POA: Insufficient documentation

## 2016-12-30 DIAGNOSIS — R279 Unspecified lack of coordination: Secondary | ICD-10-CM | POA: Insufficient documentation

## 2016-12-30 DIAGNOSIS — I10 Essential (primary) hypertension: Secondary | ICD-10-CM | POA: Insufficient documentation

## 2016-12-30 LAB — PROTIME-INR
INR: 2.13
Prothrombin Time: 24.1 seconds — ABNORMAL HIGH (ref 11.4–15.2)

## 2017-01-03 ENCOUNTER — Encounter: Payer: Self-pay | Admitting: Internal Medicine

## 2017-01-03 ENCOUNTER — Non-Acute Institutional Stay (SKILLED_NURSING_FACILITY): Payer: Medicare Other | Admitting: Internal Medicine

## 2017-01-03 DIAGNOSIS — I2782 Chronic pulmonary embolism: Secondary | ICD-10-CM | POA: Diagnosis not present

## 2017-01-03 DIAGNOSIS — J441 Chronic obstructive pulmonary disease with (acute) exacerbation: Secondary | ICD-10-CM

## 2017-01-03 DIAGNOSIS — I639 Cerebral infarction, unspecified: Secondary | ICD-10-CM

## 2017-01-03 DIAGNOSIS — E876 Hypokalemia: Secondary | ICD-10-CM | POA: Diagnosis not present

## 2017-01-03 DIAGNOSIS — I10 Essential (primary) hypertension: Secondary | ICD-10-CM

## 2017-01-03 DIAGNOSIS — K562 Volvulus: Secondary | ICD-10-CM

## 2017-01-03 NOTE — Progress Notes (Signed)
Location:   [facility] Room Number: 112/W Place of Service:  SNF [phone]) Provider:  Candida Peeling, Freddie Breech, MD  [name], [name]  Extended Emergency Contact Information Primary Emergency Contact: [name] Address: [address]          [place], [place] [address] [place] Home Phone: [phone] Mobile Phone: [phone] Relation: Brother  Code Status:  Full Code Goals of care: Advanced Directive information Advanced Directives 01/03/2017  Does Patient Have a Medical Advance Directive? Yes  Type of Advance Directive (No Data)  Does patient want to make changes to medical advance directive? No - Patient declined  Copy of Healthcare Power of Attorney in Chart? -  Would patient like information on creating a medical advance directive? No - Patient declined     Chief Complaint  Patient presents with  . 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    HPI:  Pt is a 71 y.o. male seen today for medical management of chronic diseases. As noted above.  Most recent acute issue has been a recurrent sigmoid volvulus which required hospitalization-  However this appears to have stabilized he is now having regular bowel movements at one point required a rectal tube-surgical intervention was thought to be necessary.  He is receiving MiraLAX as well as Senokot and this appears to be working he does have GI follow-up as needed  He also recently had significant hypokalemia this is being aggressively supplemented to has normalized here over the past month -his potassium is 4.7 on lab done July 3.  Also recently had a subtherapeutic INR but this has been stabilized INR is 2.13  on lab done on July 12 he is now on 3 mg Coumadin a day-she is on Coumadin with a history of DVT and pulmonary embolism in the past he also has a history of CVA with right-sided hemiparalysis.  Currently he is sitting in his wheelchair comfortably he does not really have any acute complaints follow signs appear to be stable.  Has have a history of hypertension recent blood pressures 126/74-119/71-he is on labetalol 200 mg once a day as well as lisinopril 20 mg a day   Past Medical History:  Diagnosis Date  . Dysphasia   . Fatty liver   . Hemiplegia (HCC)   . Hypertension   . Pneumonia   . Pulmonary embolism (HCC)   . 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(HCC)   . Stroke [facility])    Past Surgical History:  Procedure Laterality Date  . FLEXIBLE SIGMOIDOSCOPY N/A 10/21/2016   Procedure: FLEXIBLE SIGMOIDOSCOPY;  Surgeon: Malissa Hippo, MD;  [facility]: [facility];  Service: Endoscopy;  Laterality: N/A;  . FLEXIBLE SIGMOIDOSCOPY N/A 10/25/2016   Procedure: FLEXIBLE SIGMOIDOSCOPY;  Surgeon: Malissa Hippo, MD;  Location: [facility];  Service: Endoscopy;  Laterality: N/A;  . unable      Allergies  Allergen Reactions  . Penicillins     Has patient had a PCN reaction causing immediate rash, facial/tongue/throat swelling, SOB or lightheadedness with hypotension: unknown Has patient had a PCN reaction causing severe rash involving mucus membranes or skin necrosis: unknown Has patient had a PCN reaction that required hospitalization: unknown Has patient had a PCN reaction occurring within the last 10 years: unknown  If all of the above answers are "NO", then may proceed with Cephalosporin use. 5/3 Tolerated Cefepime x 1 dose in ED.     Outpatient Encounter Prescriptions as of 01/03/2017  Medication Sig  . acetaminophen (TYLENOL) 325 MG tablet Take 650 mg by mouth every 6 (six) hours as needed for moderate pain.   Marland Kitchen. betamethasone dipropionate (DIPROLENE) 0.05 % cream Apply to legs and hands. Do not apply to  face groin, axilla. Try Cerave first twice a day.  . Emollient (CERAVE) CREA Apply 1 application topically daily as needed for itching once a day  . Eyelid Cleansers (SYSTANE LID WIPES EX) Apply 1 application topically 2 (two) times daily. Start occusoft lid scrubs: clean eyelids of oil and debris BID OU ( each eye ) before administering eye drops.   Marland Kitchen. ipratropium (ATROVENT) 0.02 % nebulizer solution Take 0.5 mg by nebulization every 6 (six) hours.  Marland Kitchen. labetalol (NORMODYNE) 200 MG tablet Take 200 mg by mouth daily. For HTN  . lisinopril (PRINIVIL,ZESTRIL) 20 MG tablet Take 20 mg by mouth daily.  Marland Kitchen. loratadine (CLARITIN) 10 MG tablet Take 10 mg by mouth daily.  . polyethylene glycol (MIRALAX / GLYCOLAX) packet Take 17 g by mouth at bedtime.  . polyvinyl alcohol (LIQUIFILM TEARS) 1.4 % ophthalmic solution Place 1 drop into both eyes 2 (two) times daily.  . potassium chloride (K-DUR,KLOR-CON) 10 MEQ tablet Take 40 mEq by mouth 2 (two) times daily.   . sennosides-docusate sodium (SENOKOT-S) 8.6-50 MG tablet Take 1 tablet by mouth at bedtime. For constipation  . warfarin (COUMADIN) 3 MG tablet Take 3 mg by mouth daily.   Cliffton Asters. White Petrolatum-Mineral Oil (SYSTANE NIGHTTIME) OINT Place 1 application into the left eye at bedtime. Apply to left eye at night   . [DISCONTINUED] warfarin (COUMADIN) 2.5 MG tablet Give once a day on Mon,Wed, Fri, Sat 5:00 pm   No facility-administered encounter medications on file as of 01/03/2017.       Review of Systems General no complaints of fever or chills.  Skin does not complaining of any increased bruising bleeding or rashes.  Eyes does not complain of visual changes from baseline.    Resp--does not complain of shortness breath or greatly increased cough-congestion appears improved with use of nebulizers--has some chronic sounding congestion which is not new  Cardiac no chest pain has mild lower extremity edema which is baseline.   GI-does not really  complaining of increased diarrhea anymore is having regular bowel movements -does not complain of abdominal pain nausea or vomiting  Muscle skeletal does not complain of joint pain .  Neurologic continues with right-sided weakness which is baseline upper and lower extremities with some contractures of his right hand-does not complain of headache dizziness or syncopal-type feelings--does have some grip strength in right hand  Psych continues to be in good spirits pleasant does not complain of depression or anxiety  Immunization History  Administered Date(s) Administered  . Influenza-Unspecified 03/28/2014, 03/23/2016  . Pneumococcal-Unspecified 11/01/2009, 03/30/2016   Pertinent  Health Maintenance Due  Topic Date Due  . COLONOSCOPY  10/30/2025 (Originally 11/08/1995)  . INFLUENZA VACCINE  01/19/2017  . PNA vac Low Risk Adult (2 of 2 - PCV13) 03/30/2017   No flowsheet data found. Functional Status Survey:    Vitals:   01/02/17 1524  BP: 126/74  Pulse: 80  Resp: 20  Temp: 97.6 F (36.4 C)  TempSrc: Oral  SpO2: 95%  Weight is 190.4 pounds Physical Exam In general this is  a pleasant elderly male in no distress.  His skin is warm and dry.- I do not note any increased bruising     Oropharynx clear mucous membranes moist.  Eyes continues to have opacity of left eye with history of palsyright eye acuity appears grossly intact     Chest  Somewhat shallow airentry with  mild baselinel diffuse coarse breath sounds there is no labored breathing   Heart is regular rate and rhythm without murmur gallop or rub has mild baseline lower extremity edema     Abdomen soft nontender protuberant positive bowel sounds---  Muscle skeletal-continues with right-sided weakness contracture of his right hand to some extentbut is able to move right upper and lower extremities-this is baseline---strength appears preserved left upper and lower extremities is  ambulatory inwheelchair.  r  Neurologic is grossly intact continues with right-sided weakness which is not new. With some dysarthric speech which is baseline  Psych he is alert and oriented pleasant and appropriate  Labs reviewed:  Recent Labs  10/29/16 0412  11/01/16 0700  11/04/16 0438  11/26/16 0700 12/17/16 1105 12/21/16 0500  NA 147*  < > 146*  < > 143  < > 138 140 139  K 2.6*  < > 2.8*  < > 2.9*  < > 4.1 4.7 4.7  CL 115*  < > 110  < > 107  < > 104 104 106  CO2 28  < > 29  < > 31  < > 28 26 27   GLUCOSE 96  < > 82  < > 90  < > 86 113* 88  BUN 9  < > 14  < > 12  < > 15 23* 28*  CREATININE 0.89  < > 0.85  < > 0.86  < > 0.93 1.07 1.16  CALCIUM 7.9*  < > 8.1*  < > 7.9*  < > 8.8* 9.0 8.5*  MG 1.6*  --  1.8  --  1.6*  --   --   --   --   < > = values in this interval not displayed.  Recent Labs  10/21/16 0914 11/03/16 0455 12/21/16 0500  AST 38 26 23  ALT 45 42 29  ALKPHOS 59 51 58  BILITOT 0.7 0.4 0.4  PROT 8.2* 6.4* 7.0  ALBUMIN 3.5 2.9* 3.2*    Recent Labs  11/03/16 0455  11/06/16 0900 11/24/16 0718 12/21/16 0500  WBC 15.1*  < > 7.3 8.6 7.8  NEUTROABS 11.7*  --   --  5.0 4.2  HGB 13.5  < > 13.3 14.4 13.9  HCT 41.5  < > 40.2 44.3 44.1  MCV 91.6  < > 91.0 92.3 93.4  PLT 300  < > 256 308 280  < > = values in this interval not displayed. Lab Results  Component Value Date   TSH <0.010 (L) 10/27/2016   Lab Results  Component Value Date   HGBA1C  03/27/2009    6.0 (NOTE) The ADA recommends the following therapeutic goal for glycemic control related to Hgb A1c measurement: Goal of therapy: <6.5 Hgb A1c  Reference: American Diabetes Association: Clinical Practice Recommendations 2010, Diabetes Care, 2010, 33: (Suppl  1).   Lab Results  Component Value Date   CHOL 117 07/28/2016   HDL 33 (L) 07/28/2016   LDLCALC 75 07/28/2016   TRIG 45 07/28/2016   CHOLHDL 3.5 07/28/2016    Significant Diagnostic Results in last 30 days:  No results  found.  Assessment/Plan  #1-history  of sigmoid bolus this appears to be significantly improved he is on MiraLAX and senna as point will monitor.  #2 history CVA with right-sided hemiparalysis this is baseline continues on Coumadin for anticoagulation LDL was 75 on lab done in February we'll update a lipid panel  3-history of DVT and pulmonary embolism again he is on Coumadin and INR is now therapeutic 13 update INR has been ordered for July 19.  #4 history of hypertension this appears stable on Lopressor and labetalol.  #5 history of hypokalemia he is on aggressive supplementation 40 mEq potassium-twice a day this has normalized with potassium of 4.7 on lab done July 3 will update this when INR is drawn later this week.  #6 history of COPD this is stable on when necessary nebs as some chronic congestion but this is not increased from baseline.  Again will update a BMP and lipid panel when INR is drawn later this week.  [id number]

## 2017-01-06 ENCOUNTER — Encounter (HOSPITAL_COMMUNITY)
Admission: RE | Admit: 2017-01-06 | Discharge: 2017-01-06 | Disposition: A | Discharge status: Home / Self Care | Payer: Medicare Other | Source: Skilled Nursing Facility | Attending: Internal Medicine | Admitting: Internal Medicine

## 2017-01-06 DIAGNOSIS — M6281 Muscle weakness (generalized): Secondary | ICD-10-CM | POA: Diagnosis not present

## 2017-01-06 DIAGNOSIS — R1312 Dysphagia, oropharyngeal phase: Secondary | ICD-10-CM | POA: Diagnosis not present

## 2017-01-06 DIAGNOSIS — I69328 Other speech and language deficits following cerebral infarction: Secondary | ICD-10-CM | POA: Insufficient documentation

## 2017-01-06 DIAGNOSIS — R279 Unspecified lack of coordination: Secondary | ICD-10-CM | POA: Diagnosis not present

## 2017-01-06 DIAGNOSIS — I10 Essential (primary) hypertension: Secondary | ICD-10-CM | POA: Diagnosis not present

## 2017-01-06 LAB — IRON AND TIBC
Iron: 75 ug/dL (ref 45–182)
SATURATION RATIOS: 28 % (ref 17.9–39.5)
TIBC: 272 ug/dL (ref 250–450)
UIBC: 197 ug/dL

## 2017-01-06 LAB — PROTIME-INR
INR: 1.94
Prothrombin Time: 22.4 seconds — ABNORMAL HIGH (ref 11.4–15.2)

## 2017-01-06 LAB — FOLATE: FOLATE: 10.1 ng/mL (ref >5.9)

## 2017-01-06 LAB — BASIC METABOLIC PANEL
ANION GAP: 8 (ref 5–15)
BUN: 21 mg/dL — ABNORMAL HIGH (ref 6–20)
CALCIUM: 8.8 mg/dL — AB (ref 8.9–10.3)
CO2: 27 mmol/L (ref 22–32)
Chloride: 104 mmol/L (ref 101–111)
Creatinine, Ser: 1.03 mg/dL (ref 0.61–1.24)
Glucose, Bld: 86 mg/dL (ref 65–99)
POTASSIUM: 4.4 mmol/L (ref 3.5–5.1)
SODIUM: 139 mmol/L (ref 135–145)

## 2017-01-06 LAB — FERRITIN: Ferritin: 87 ng/mL (ref 24–336)

## 2017-01-06 LAB — VITAMIN B12: Vitamin B-12: 247 pg/mL (ref 180–914)

## 2017-01-07 LAB — FOLATE RBC
Folate, Hemolysate: 310.6 ng/mL
Folate, RBC: 698 ng/mL (ref >498)
Hematocrit: 44.5 % (ref 37.5–51.0)

## 2017-01-10 ENCOUNTER — Encounter (HOSPITAL_COMMUNITY)
Admission: RE | Admit: 2017-01-10 | Discharge: 2017-01-10 | Disposition: A | Discharge status: Home / Self Care | Payer: Medicare Other | Source: Skilled Nursing Facility | Attending: Internal Medicine | Admitting: Internal Medicine

## 2017-01-10 DIAGNOSIS — G8191 Hemiplegia, unspecified affecting right dominant side: Secondary | ICD-10-CM | POA: Diagnosis not present

## 2017-01-10 DIAGNOSIS — R293 Abnormal posture: Secondary | ICD-10-CM | POA: Diagnosis not present

## 2017-01-10 DIAGNOSIS — I10 Essential (primary) hypertension: Secondary | ICD-10-CM | POA: Diagnosis not present

## 2017-01-10 DIAGNOSIS — I69998 Other sequelae following unspecified cerebrovascular disease: Secondary | ICD-10-CM | POA: Diagnosis not present

## 2017-01-10 DIAGNOSIS — Z7901 Long term (current) use of anticoagulants: Secondary | ICD-10-CM | POA: Diagnosis not present

## 2017-01-10 LAB — PROTIME-INR
INR: 2.19
PROTHROMBIN TIME: 24.7 s — AB (ref 11.4–15.2)

## 2017-01-19 ENCOUNTER — Encounter: Payer: Self-pay | Admitting: Internal Medicine

## 2017-01-19 ENCOUNTER — Non-Acute Institutional Stay (SKILLED_NURSING_FACILITY): Payer: Medicare Other | Admitting: Internal Medicine

## 2017-01-19 DIAGNOSIS — E876 Hypokalemia: Secondary | ICD-10-CM | POA: Diagnosis not present

## 2017-01-19 DIAGNOSIS — H6123 Impacted cerumen, bilateral: Secondary | ICD-10-CM

## 2017-01-19 NOTE — Progress Notes (Signed)
Location:   [facility] Room Number: 112/W Place of Service:  SNF [phone]) Provider:  Sabino DickArlo Leara Rawl  Gupta, Anjali L, MD  Patient Care Team: Mahlon GammonGupta, Anjali L, [name]  Extended Emergency Contact Information Primary Emergency Contact: Venetia MaxonWalker,[name] Jr Address: [address]          [place], [place] [address] [place] Home Phone: [phone] Mobile Phone: [phone] Relation: Brother  Code Status:  Full Code Goals of care: Advanced Directive information Advanced Directives 01/19/2017  Does Patient Have a Medical Advance Directive? Yes  Type of Advance Directive (No Data)  Does patient want to make changes to medical advance directive? No - Patient declined  Copy of Healthcare Power of Attorney in Chart? -  Would patient like information on creating a medical advance directive? No - Patient declined     Chief Complaint  Patient presents with  . Acute Visit    cerumen in ears    HPI:  Pt is a 71 y.o. male seen today for an acute visit for Complaints of decreased hearing today in his right ear-I saw him several weeks ago for complaints of decreased hearing in his left ear-he was found to have a significant amount of wax buildup more so on his left ear versus the right he did receive a course ofBebrox--  However the last couple days has complained of decreased hearing in his right ear he says the hearing in his left ear has improved.  She does not complaining of any pain here-vital signs appear to be stable he is not running a fever otherwise says he is doing all right.  I do note he did have a history of hypokalemia in the recent past but this has normalized on potassium supplementation we will update a BMP when he gets an INR done on August 6.  He is on chronic Coumadin with a history of CVA and DVT and pulmonary embolism-INR is therapeutic at 2.19 on lab done on July 23  He also has a recent history of sigmoid  volvulus he did not require surgical intervention he is on MiraLAX as well as Senokot and this appears to have stabilized-this was quite a concern earlier this year Required recurrent hospitalizations --but he has really responded well to the current therapy and is back at his baseline - Past Medical History:  Diagnosis Date  . Dysphasia   . Fatty liver   . Hemiplegia (HCC)   . Hypertension   . Pneumonia   . Pulmonary embolism (HCC)   . 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(HCC)   . Stroke [facility](HCC)    Past Surgical History:  Procedure Laterality Date  . FLEXIBLE SIGMOIDOSCOPY N/A 10/21/2016   Procedure: FLEXIBLE SIGMOIDOSCOPY;  Surgeon: Malissa Hippoehman, Najeeb U, MD;  [facility]: [facility];  Service: Endoscopy;  Laterality: N/A;  . FLEXIBLE SIGMOIDOSCOPY N/A 10/25/2016   Procedure: FLEXIBLE SIGMOIDOSCOPY;  Surgeon: Malissa Hippoehman, Najeeb U, MD;  Location: [facility];  Service: Endoscopy;  Laterality: N/A;  . unable      Allergies  Allergen Reactions  . Penicillins     Has patient had a PCN reaction causing immediate rash, facial/tongue/throat swelling, SOB or lightheadedness with hypotension: unknown Has patient had a PCN reaction causing severe rash involving mucus membranes or skin necrosis: unknown Has patient had a PCN reaction that required hospitalization: unknown Has patient had a PCN reaction occurring within the last 10 years: unknown If all of the above answers are "NO",  then may proceed with Cephalosporin use. 5/3 Tolerated Cefepime x 1 dose in ED.     Outpatient Encounter Prescriptions as of 01/19/2017  Medication Sig  . acetaminophen (TYLENOL) 325 MG tablet Take 650 mg by mouth every 6 (six) hours as needed for moderate pain.   Marland Kitchen betamethasone dipropionate (DIPROLENE) 0.05 % cream Apply to legs and hands. Do not apply to face groin, axilla. Try Cerave first twice a day.  . Emollient (CERAVE) CREA Apply 1 application topically daily as needed for itching once a day  . Eyelid Cleansers (SYSTANE LID WIPES  EX) Apply 1 application topically 2 (two) times daily. Start occusoft lid scrubs: clean eyelids of oil and debris BID OU ( each eye ) before administering eye drops.   Marland Kitchen ipratropium (ATROVENT) 0.02 % nebulizer solution Take 0.5 mg by nebulization every 6 (six) hours.  Marland Kitchen labetalol (NORMODYNE) 200 MG tablet Take 200 mg by mouth daily. For HTN  . lisinopril (PRINIVIL,ZESTRIL) 20 MG tablet Take 20 mg by mouth daily.  Marland Kitchen loratadine (CLARITIN) 10 MG tablet Take 10 mg by mouth daily.  . polyethylene glycol (MIRALAX / GLYCOLAX) packet Take 17 g by mouth at bedtime.  . polyvinyl alcohol (LIQUIFILM TEARS) 1.4 % ophthalmic solution Place 1 drop into both eyes 2 (two) times daily.  . potassium chloride (K-DUR,KLOR-CON) 10 MEQ tablet Take 40 mEq by mouth 2 (two) times daily.   . sennosides-docusate sodium (SENOKOT-S) 8.6-50 MG tablet Take 1 tablet by mouth at bedtime. For constipation  . warfarin (COUMADIN) 3 MG tablet Take 3 mg by mouth daily.   Cliffton Asters Petrolatum-Mineral Oil (SYSTANE NIGHTTIME) OINT Place 1 application into the left eye at bedtime. Apply to left eye at night    No facility-administered encounter medications on file as of 01/19/2017.     Review of Systems General no complaints of fever or chills.  Skin does not complaining of any increased bruising bleeding or rashes.   Ears is complaining of decreased hearing in his right ear says the hearing in his left ear is improved is not complaining of any ear pain  Resp--does not complain of shortness breath or greatly increased cough-congestion appears improved with use of nebulizers--has some chronic sounding congestion which is not new  Cardiac no chest pain has mild lower extremity edema which is baseline.   GI-does not really complaining of increased diarrhea anymore is having regular bowel movements -does not complain of abdominal pain nausea or vomiting  Muscle skeletal does not complain of joint pain .    Immunization  History  Administered Date(s) Administered  . Influenza-Unspecified 03/28/2014, 03/23/2016  . Pneumococcal-Unspecified 11/01/2009, 03/30/2016   Pertinent  Health Maintenance Due  Topic Date Due  . INFLUENZA VACCINE  05/21/2017 (Originally 01/19/2017)  . COLONOSCOPY  10/30/2025 (Originally 11/08/1995)  . PNA vac Low Risk Adult (2 of 2 - PCV13) 03/30/2017   No flowsheet data found. Functional Status Survey:    Vitals:   01/19/17 1529  BP: 105/62  Pulse: 90  Resp: 20  Temp: 98.2 F (36.8 C)  TempSrc: Oral  SpO2: 91%   There is no height or weight on file to calculate BMI. Physical Exam In general this is a pleasant elderly male in no distress ambulating in his wheelchair.  His skin is warm and dry.  Ears he has a moderate amount of wax in his right ear tympanic membrane is visualized and pearly gray I do not see any erythema or exudate.  Left ear appears considerably  improved compared to previous visits-tympanic membrane is pearly gray and visualized he still has some wax around the edges but this is significant   ess than previous exam   Heart is regular rate and rhythm without murmur gallop or rub he has chronic lower extremity edema which appears baseline.  Chest continue with shallow air entry with some bronchial sounds there is no labored breathing this is relatively baseline as well.   Neurologic-right-sided weakness at baseline he has some dysarthric speech with his baseline-continues to ambulate well in facility  With  his wheelchair-  Psych he continues to be pleasant and appropriate.       Labs reviewed:  Recent Labs  10/29/16 0412  11/01/16 0700  11/04/16 0438  12/17/16 1105 12/21/16 0500 01/06/17 0715  NA 147*  < > 146*  < > 143  < > 140 139 139  K 2.6*  < > 2.8*  < > 2.9*  < > 4.7 4.7 4.4  CL 115*  < > 110  < > 107  < > 104 106 104  CO2 28  < > 29  < > 31  < > 26 27 27   GLUCOSE 96  < > 82  < > 90  < > 113* 88 86  BUN 9  < > 14  < > 12  < > 23*  28* 21*  CREATININE 0.89  < > 0.85  < > 0.86  < > 1.07 1.16 1.03  CALCIUM 7.9*  < > 8.1*  < > 7.9*  < > 9.0 8.5* 8.8*  MG 1.6*  --  1.8  --  1.6*  --   --   --   --   < > = values in this interval not displayed.  Recent Labs  10/21/16 0914 11/03/16 0455 12/21/16 0500  AST 38 26 23  ALT 45 42 29  ALKPHOS 59 51 58  BILITOT 0.7 0.4 0.4  PROT 8.2* 6.4* 7.0  ALBUMIN 3.5 2.9* 3.2*    Recent Labs  11/03/16 0455  11/06/16 0900 11/24/16 0718 12/21/16 0500 01/06/17 0715  WBC 15.1*  < > 7.3 8.6 7.8  --   NEUTROABS 11.7*  --   --  5.0 4.2  --   HGB 13.5  < > 13.3 14.4 13.9  --   HCT 41.5  < > 40.2 44.3 44.1 44.5  MCV 91.6  < > 91.0 92.3 93.4  --   PLT 300  < > 256 308 280  --   < > = values in this interval not displayed. Lab Results  Component Value Date   TSH <0.010 (L) 10/27/2016   Lab Results  Component Value Date   HGBA1C  03/27/2009    6.0 (NOTE) The ADA recommends the following therapeutic goal for glycemic control related to Hgb A1c measurement: Goal of therapy: <6.5 Hgb A1c  Reference: American Diabetes Association: Clinical Practice Recommendations 2010, Diabetes Care, 2010, 33: (Suppl  1).   Lab Results  Component Value Date   CHOL 117 07/28/2016   HDL 33 (L) 07/28/2016   LDLCALC 75 07/28/2016   TRIG 45 07/28/2016   CHOLHDL 3.5 07/28/2016    Significant Diagnostic Results in last 30 days:  No results found.  Assessment/Plan T #1-wax impaction-will order flushing of both ears-wax buildup appears improved but I suspect he would benefit from additional flushing if this does not improve we will do another course of the proximal.  The wax does appear considerably softer than  when I initially assessed in.  #2 history hypokalemia this is being supplemented aggressively with potassium will order a metabolic panel when and INR is drawn back on August 6--this has normalized now for an extended period of time which is reassuring.  [id number]

## 2017-01-24 ENCOUNTER — Encounter (HOSPITAL_COMMUNITY)
Admission: RE | Admit: 2017-01-24 | Discharge: 2017-01-24 | Disposition: A | Discharge status: Home / Self Care | Payer: Medicare Other | Source: Skilled Nursing Facility | Attending: Internal Medicine | Admitting: Internal Medicine

## 2017-01-24 DIAGNOSIS — I10 Essential (primary) hypertension: Secondary | ICD-10-CM | POA: Diagnosis not present

## 2017-01-24 DIAGNOSIS — G8191 Hemiplegia, unspecified affecting right dominant side: Secondary | ICD-10-CM | POA: Diagnosis not present

## 2017-01-24 DIAGNOSIS — I69998 Other sequelae following unspecified cerebrovascular disease: Secondary | ICD-10-CM | POA: Insufficient documentation

## 2017-01-24 DIAGNOSIS — R293 Abnormal posture: Secondary | ICD-10-CM | POA: Insufficient documentation

## 2017-01-24 DIAGNOSIS — Z7901 Long term (current) use of anticoagulants: Secondary | ICD-10-CM | POA: Diagnosis not present

## 2017-01-24 LAB — BASIC METABOLIC PANEL
ANION GAP: 9 (ref 5–15)
BUN: 25 mg/dL — AB (ref 6–20)
CALCIUM: 9.1 mg/dL (ref 8.9–10.3)
CO2: 25 mmol/L (ref 22–32)
CREATININE: 1.12 mg/dL (ref 0.61–1.24)
Chloride: 106 mmol/L (ref 101–111)
GFR calc Af Amer: >60 mL/min (ref >60)
GLUCOSE: 89 mg/dL (ref 65–99)
Potassium: 4.9 mmol/L (ref 3.5–5.1)
Sodium: 140 mmol/L (ref 135–145)

## 2017-01-24 LAB — PROTIME-INR
INR: 2.28
PROTHROMBIN TIME: 25.6 s — AB (ref 11.4–15.2)

## 2017-02-07 ENCOUNTER — Encounter (HOSPITAL_COMMUNITY)
Admission: RE | Admit: 2017-02-07 | Discharge: 2017-02-07 | Disposition: A | Discharge status: Home / Self Care | Payer: Medicare Other | Source: Skilled Nursing Facility | Attending: *Deleted | Admitting: *Deleted

## 2017-02-07 ENCOUNTER — Encounter: Payer: Self-pay | Admitting: Internal Medicine

## 2017-02-07 ENCOUNTER — Non-Acute Institutional Stay (SKILLED_NURSING_FACILITY): Payer: Medicare Other | Admitting: Internal Medicine

## 2017-02-07 DIAGNOSIS — J441 Chronic obstructive pulmonary disease with (acute) exacerbation: Secondary | ICD-10-CM

## 2017-02-07 DIAGNOSIS — I10 Essential (primary) hypertension: Secondary | ICD-10-CM | POA: Diagnosis not present

## 2017-02-07 DIAGNOSIS — I69998 Other sequelae following unspecified cerebrovascular disease: Secondary | ICD-10-CM | POA: Diagnosis not present

## 2017-02-07 DIAGNOSIS — I824Y9 Acute embolism and thrombosis of unspecified deep veins of unspecified proximal lower extremity: Secondary | ICD-10-CM | POA: Diagnosis not present

## 2017-02-07 DIAGNOSIS — R293 Abnormal posture: Secondary | ICD-10-CM | POA: Diagnosis not present

## 2017-02-07 DIAGNOSIS — K562 Volvulus: Secondary | ICD-10-CM

## 2017-02-07 DIAGNOSIS — G8191 Hemiplegia, unspecified affecting right dominant side: Secondary | ICD-10-CM | POA: Diagnosis not present

## 2017-02-07 DIAGNOSIS — Z7901 Long term (current) use of anticoagulants: Secondary | ICD-10-CM | POA: Diagnosis not present

## 2017-02-07 DIAGNOSIS — I639 Cerebral infarction, unspecified: Secondary | ICD-10-CM

## 2017-02-07 LAB — PROTIME-INR
INR: 2.06
PROTHROMBIN TIME: 23.5 s — AB (ref 11.4–15.2)

## 2017-02-07 NOTE — Progress Notes (Signed)
Location:   [facility] Room Number: 112/W Place of Service:  SNF [phone]) Provider:  Lenon Curt, Freddie Breech, MD  [name], [name]  Extended Emergency Contact Information Primary Emergency Contact: [name] Address: [address]          [place], [place] [address] D[place] of [place] Home Phone: [phone] Mobile Phone: [phone] Relation: Brother  Code Status:  Full Code Goals of care: Advanced Directive information Advanced Directives 02/07/2017  Does Patient Have a Medical Advance Directive? Yes  Type of Advance Directive (No Data)  Does patient want to make changes to medical advance directive? No - Patient declined  Copy of Healthcare Power of Attorney in Chart? -  Would patient like information on creating a medical advance directive? No - Patient declined     Chief Complaint  Patient presents with  . Medical Management of Chronic Issues    Routine Visit    HPI:  Pt is a 71 y.o. male seen today for medical management of chronic diseases.    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 His last admission was in 11/08/16. And he has been doing well in facility since then. He Denies any Abdominal pain. No distension. No Nausea or Vomiting.  He is c/o Cough with no Sputum. No SOB . He is not on Oxygen anymore. He does have discharge from his Left eye and it is red. Denies any pain or itching. No Change in his vision. His weight today is 186 lbs which is stable.     Past Medical History:  Diagnosis Date  . Dysphasia   . Fatty liver   . Hemiplegia (HCC)   . Hypertension   . Pneumonia   . Pulmonary embolism (HCC)   . 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(HCC)   .  Stroke [facility])    Past Surgical History:  Procedure Laterality Date  . FLEXIBLE SIGMOIDOSCOPY N/A 10/21/2016   Procedure: FLEXIBLE SIGMOIDOSCOPY;  Surgeon: Malissa Hippo, MD;  Location: [facility];  Service: Endoscopy;  Laterality: N/A;  . FLEXIBLE SIGMOIDOSCOPY N/A 10/25/2016   Procedure: FLEXIBLE SIGMOIDOSCOPY;  Surgeon: Malissa Hippo, MD;  Location: [facility];  Service: Endoscopy;  Laterality: N/A;  . unable      Allergies  Allergen Reactions  . Penicillins     Has patient had a PCN reaction causing immediate rash, facial/tongue/throat swelling, SOB or lightheadedness with hypotension: unknown Has patient had a PCN reaction causing severe rash involving mucus membranes or skin necrosis: unknown Has patient had a PCN reaction that required hospitalization: unknown Has patient had a PCN reaction occurring within the last 10 years: unknown If all of the above answers are "NO", then may proceed with Cephalosporin use. 5/3 Tolerated Cefepime x 1 dose in ED.     Outpatient Encounter Prescriptions as of 02/07/2017  Medication Sig  . acetaminophen (TYLENOL) 325 MG tablet Take 650 mg by mouth every 6 (six) hours as needed for moderate pain.   Marland Kitchen betamethasone dipropionate (DIPROLENE) 0.05 % cream Apply to legs and hands. Do not apply to face groin, axilla. Try Cerave first twice a day.  . Emollient (CERAVE) CREA Apply 1 application topically daily as needed for itching once a day  . Eyelid  Cleansers (SYSTANE LID WIPES EX) Apply 1 application topically 2 (two) times daily. Start occusoft lid scrubs: clean eyelids of oil and debris BID OU ( each eye ) before administering eye drops.   Marland Kitchen ipratropium (ATROVENT) 0.02 % nebulizer solution Take 0.5 mg by nebulization every 6 (six) hours.  Marland Kitchen labetalol (NORMODYNE) 200 MG tablet Take 200 mg by mouth daily. For HTN  . lisinopril (PRINIVIL,ZESTRIL) 20 MG tablet Take 20 mg by mouth daily.  Marland Kitchen loratadine (CLARITIN) 10 MG tablet Take 10 mg by mouth  daily.  . polyethylene glycol (MIRALAX / GLYCOLAX) packet Take 17 g by mouth at bedtime.  . polyvinyl alcohol (LIQUIFILM TEARS) 1.4 % ophthalmic solution Place 1 drop into both eyes 2 (two) times daily.  . potassium chloride (K-DUR,KLOR-CON) 10 MEQ tablet Take 40 mEq by mouth 2 (two) times daily.   . sennosides-docusate sodium (SENOKOT-S) 8.6-50 MG tablet Take 1 tablet by mouth at bedtime. For constipation  . warfarin (COUMADIN) 3 MG tablet Take 3 mg by mouth daily.   Cliffton Asters Petrolatum-Mineral Oil (SYSTANE NIGHTTIME) OINT Place 1 application into the left eye at bedtime. Apply to left eye at night    No facility-administered encounter medications on file as of 02/07/2017.      Review of Systems  Constitutional: Negative.   HENT: Negative.   Eyes: Positive for discharge. Negative for pain, itching and visual disturbance.  Respiratory: Positive for cough. Negative for apnea, chest tightness and shortness of breath.   Cardiovascular: Negative.   Gastrointestinal: Negative for abdominal distention, abdominal pain, diarrhea and nausea.  Genitourinary: Negative.   Musculoskeletal: Negative.   Skin: Negative.   Neurological: Negative.   Psychiatric/Behavioral: Negative.     Immunization History  Administered Date(s) Administered  . Influenza-Unspecified 03/28/2014, 03/23/2016  . Pneumococcal-Unspecified 11/01/2009, 03/30/2016   Pertinent  Health Maintenance Due  Topic Date Due  . INFLUENZA VACCINE  05/21/2017 (Originally 01/19/2017)  . COLONOSCOPY  10/30/2025 (Originally 11/08/1995)  . PNA vac Low Risk Adult (2 of 2 - PCV13) 03/30/2017   No flowsheet data found. Functional Status Survey:    Vitals:   02/07/17 1434  BP: 123/81  Pulse: 86  Resp: 20  Temp: (!) 97.3 F (36.3 C)  TempSrc: Oral  SpO2: (!) 20%  Weight: 186 lb 12.8 oz (84.7 kg)  Height: 5' 8.5" (1.74 m)   Body mass index is 27.99 kg/m. Physical Exam  Constitutional: He appears well-developed and well-nourished.    HENT:  Head: Normocephalic.  Mouth/Throat: Oropharynx is clear and moist.  Eyes:  Redness of Left Eye with discharge  Neck: Neck supple.  Cardiovascular: Normal rate and normal heart sounds.   Pulmonary/Chest:  Patient has Bilateral Rhonchi and Expiratory wheezing.  Abdominal: Soft. Bowel sounds are normal. He exhibits no distension. There is no tenderness. There is no rebound.  Musculoskeletal: He exhibits edema.  Neurological: He is alert.  Has Left hemiparesis  Skin: Skin is warm and dry.  Psychiatric: He has a normal mood and affect. His behavior is normal. Thought content normal.    Labs reviewed:  Recent Labs  10/29/16 0412  11/01/16 0700  11/04/16 0438  12/21/16 0500 01/06/17 0715 01/24/17 1045  NA 147*  < > 146*  < > 143  < > 139 139 140  K 2.6*  < > 2.8*  < > 2.9*  < > 4.7 4.4 4.9  CL 115*  < > 110  < > 107  < > 106 104 106  CO2 28  < >  29  < > 31  < > 27 27 25   GLUCOSE 96  < > 82  < > 90  < > 88 86 89  BUN 9  < > 14  < > 12  < > 28* 21* 25*  CREATININE 0.89  < > 0.85  < > 0.86  < > 1.16 1.03 1.12  CALCIUM 7.9*  < > 8.1*  < > 7.9*  < > 8.5* 8.8* 9.1  MG 1.6*  --  1.8  --  1.6*  --   --   --   --   < > = values in this interval not displayed.  Recent Labs  10/21/16 0914 11/03/16 0455 12/21/16 0500  AST 38 26 23  ALT 45 42 29  ALKPHOS 59 51 58  BILITOT 0.7 0.4 0.4  PROT 8.2* 6.4* 7.0  ALBUMIN 3.5 2.9* 3.2*    Recent Labs  11/03/16 0455  11/06/16 0900 11/24/16 0718 12/21/16 0500 01/06/17 0715  WBC 15.1*  < > 7.3 8.6 7.8  --   NEUTROABS 11.7*  --   --  5.0 4.2  --   HGB 13.5  < > 13.3 14.4 13.9  --   HCT 41.5  < > 40.2 44.3 44.1 44.5  MCV 91.6  < > 91.0 92.3 93.4  --   PLT 300  < > 256 308 280  --   < > = values in this interval not displayed. Lab Results  Component Value Date   TSH <0.010 (L) 10/27/2016   Lab Results  Component Value Date   HGBA1C  03/27/2009    6.0 (NOTE) The ADA recommends the following therapeutic goal for glycemic  control related to Hgb A1c measurement: Goal of therapy: <6.5 Hgb A1c  Reference: American Diabetes Association: Clinical Practice Recommendations 2010, Diabetes Care, 2010, 33: (Suppl  1).   Lab Results  Component Value Date   CHOL 117 07/28/2016   HDL 33 (L) 07/28/2016   LDLCALC 75 07/28/2016   TRIG 45 07/28/2016   CHOLHDL 3.5 07/28/2016    Significant Diagnostic Results in last 30 days:  No results found.  Assessment/Plan  COPD Exacerbation  Start on Duo Nebs and Spiriva again Discontinue Atrovent Dont think he needs Antibiotics right now. POX QD  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Patient is doing well. We will continue Miralax and Senna. Was seen by GI and no Changes made  H/O DVT and PE On coumadin. Last INR today was 2.06  Has been stable and mostly above 2 on 3 mg of Coumadin. Continue to monitor INR.  S/P CVA Patient stable. BP controlled. He is now on Dyphagic Diet and doing well. LDL was 75 in 02/18.   Hypokalemia On 80 Meq QD Last BMP the Potassium was 4.9. Will decrease the dose to 40 meq QD  Conjunctivitis Will start him on Ocuflox  Essential hypertension BP well controlled.  continue Labetalol and Lisinopril   Family/ staff Communication:   Labs/tests ordered:  BMP in 2 Weeks with INR   Total time spent in this patient care encounter was 45_ minutes; greater than 50% of the visit spent counseling patient, reviewing records , Labs and coordinating care for problems addressed at this encounter.

## 2017-02-21 ENCOUNTER — Encounter (HOSPITAL_COMMUNITY)
Admission: RE | Admit: 2017-02-21 | Discharge: 2017-02-21 | Disposition: A | Discharge status: Home / Self Care | Payer: Medicare Other | Source: Skilled Nursing Facility | Attending: Internal Medicine | Admitting: Internal Medicine

## 2017-02-21 DIAGNOSIS — R293 Abnormal posture: Secondary | ICD-10-CM | POA: Insufficient documentation

## 2017-02-21 DIAGNOSIS — G8191 Hemiplegia, unspecified affecting right dominant side: Secondary | ICD-10-CM | POA: Diagnosis not present

## 2017-02-21 DIAGNOSIS — I69998 Other sequelae following unspecified cerebrovascular disease: Secondary | ICD-10-CM | POA: Diagnosis not present

## 2017-02-21 DIAGNOSIS — I10 Essential (primary) hypertension: Secondary | ICD-10-CM | POA: Insufficient documentation

## 2017-02-21 DIAGNOSIS — Z7901 Long term (current) use of anticoagulants: Secondary | ICD-10-CM | POA: Insufficient documentation

## 2017-02-21 LAB — PROTIME-INR
INR: 2.25
Prothrombin Time: 24.7 seconds — ABNORMAL HIGH (ref 11.4–15.2)

## 2017-02-21 LAB — BRAIN NATRIURETIC PEPTIDE: B Natriuretic Peptide: 18 pg/mL (ref 0.0–100.0)

## 2017-03-04 ENCOUNTER — Encounter: Payer: Self-pay | Admitting: Internal Medicine

## 2017-03-04 NOTE — Progress Notes (Signed)
Location:   [facility] Room Number: 112/W Place of Service:  SNF [phone]) Provider:  Candida Peeling, Freddie Breech, MD  [name], [name]  Extended Emergency Contact Information Primary Emergency Contact: [name] Address: [address]          [place], [place] [address] [place] Home Phone: [phone] Mobile Phone: [phone] Relation: Brother  Code Status:  Full Code Goals of care: Advanced Directive information Advanced Directives 03/04/2017  Does Patient Have a Medical Advance Directive? Yes  Type of Advance Directive (No Data)  Does patient want to make changes to medical advance directive? No - Patient declined  Copy of Healthcare Power of Attorney in Chart? -  Would patient like information on creating a medical advance directive? No - Patient declined     Chief Complaint  Patient presents with  . Medical Management of Chronic Issues    Routine Visit    HPI:  Pt is a 71 y.o. male seen today for medical management of chronic diseases.     Past Medical History:  Diagnosis Date  . Dysphasia   . Fatty liver   . Hemiplegia (HCC)   . Hypertension   . Pneumonia   . Pulmonary embolism (HCC)   . 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(HCC)   . Stroke [facility])    Past Surgical History:  Procedure Laterality Date  . FLEXIBLE SIGMOIDOSCOPY N/A 10/21/2016   Procedure: FLEXIBLE SIGMOIDOSCOPY;  Surgeon: Malissa Hippo, MD;  [facility];  Service: Endoscopy;  Laterality: N/A;  . FLEXIBLE SIGMOIDOSCOPY N/A 10/25/2016   Procedure: FLEXIBLE SIGMOIDOSCOPY;  Surgeon: Malissa Hippo, MD;  Location: [facility];  Service: Endoscopy;  Laterality: N/A;  . unable      Allergies  Allergen Reactions  . Penicillins     Has patient had a PCN reaction causing immediate rash, facial/tongue/throat swelling, SOB or lightheadedness with hypotension: unknown Has patient had a PCN reaction causing  severe rash involving mucus membranes or skin necrosis: unknown Has patient had a PCN reaction that required hospitalization: unknown Has patient had a PCN reaction occurring within the last 10 years: unknown If all of the above answers are "NO", then may proceed with Cephalosporin use. 5/3 Tolerated Cefepime x 1 dose in ED.     Outpatient Encounter Prescriptions as of 03/04/2017  Medication Sig  . acetaminophen (TYLENOL) 325 MG tablet Take 650 mg by mouth every 6 (six) hours as needed for moderate pain.   Marland Kitchen betamethasone dipropionate (DIPROLENE) 0.05 % cream Apply to legs and hands. Do not apply to face groin, axilla. Try Cerave first twice a day.  . carboxymethylcellulose (REFRESH PLUS) 0.5 % SOLN Place 1 drop into both eyes 2 (two) times daily.  . Emollient (CERAVE) CREA Apply 1 application topically daily as needed for itching once a day  . Eyelid Cleansers (SYSTANE LID WIPES EX) Apply 1 application topically 2 (two) times daily. Start occusoft lid scrubs: clean eyelids of oil and debris BID OU ( each eye ) before administering eye drops.   Marland Kitchen ipratropium (ATROVENT) 0.02 % nebulizer solution Take 0.5 mg by nebulization every 6 (six) hours.  Marland Kitchen labetalol (NORMODYNE) 200 MG tablet Take 200 mg by mouth daily. For HTN  . lisinopril (PRINIVIL,ZESTRIL) 20 MG tablet Take 20 mg by mouth daily.  Marland Kitchen loratadine (CLARITIN) 10 MG tablet Take 10 mg by mouth daily.  . polyethylene glycol (MIRALAX / GLYCOLAX) packet  Take 17 g by mouth at bedtime.  . potassium chloride (K-DUR,KLOR-CON) 10 MEQ tablet Take 40 mEq by mouth daily.   . sennosides-docusate sodium (SENOKOT-S) 8.6-50 MG tablet Take 1 tablet by mouth at bedtime. For constipation  . tiotropium (SPIRIVA HANDIHALER) 18 MCG inhalation capsule Place 18 mcg into inhaler and inhale daily.  Marland Kitchen warfarin (COUMADIN) 3 MG tablet Take 3 mg by mouth daily.   Cliffton Asters Petrolatum-Mineral Oil (SYSTANE NIGHTTIME) OINT Place 1 application into the left eye at bedtime.  Apply to left eye at night   . [DISCONTINUED] polyvinyl alcohol (LIQUIFILM TEARS) 1.4 % ophthalmic solution Place 1 drop into both eyes 2 (two) times daily.   No facility-administered encounter medications on file as of 03/04/2017.      Review of Systems  Immunization History  Administered Date(s) Administered  . Influenza-Unspecified 03/28/2014, 03/23/2016  . Pneumococcal-Unspecified 11/01/2009, 03/30/2016   Pertinent  Health Maintenance Due  Topic Date Due  . INFLUENZA VACCINE  05/21/2017 (Originally 01/19/2017)  . COLONOSCOPY  10/30/2025 (Originally 11/08/1995)  . PNA vac Low Risk Adult (2 of 2 - PCV13) 03/30/2017   No flowsheet data found. Functional Status Survey:    Vitals:   03/04/17 1410  BP: (!) 99/51  Pulse: 90  Resp: 18  Temp: 98.6 F (37 C)  TempSrc: Oral  SpO2: 94%   There is no height or weight on file to calculate BMI. Physical Exam  Labs reviewed:  Recent Labs  10/29/16 0412  11/01/16 0700  11/04/16 0438  12/21/16 0500 01/06/17 0715 01/24/17 1045  NA 147*  < > 146*  < > 143  < > 139 139 140  K 2.6*  < > 2.8*  < > 2.9*  < > 4.7 4.4 4.9  CL 115*  < > 110  < > 107  < > 106 104 106  CO2 28  < > 29  < > 31  < > GLUCOSE 96  < > 82  < > 90  < > 88 86 89  BUN 9  < > 14  < > 12  < > 28* 21* 25*  CREATININE 0.89  < > 0.85  < > 0.86  < > 1.16 1.03 1.12  CALCIUM 7.9*  < > 8.1*  < > 7.9*  < > 8.5* 8.8* 9.1  MG 1.6*  --  1.8  --  1.6*  --   --   --   --   < > = values in this interval not displayed.  Recent Labs  10/21/16 0914 11/03/16 0455 12/21/16 0500  AST 38 26 23  ALT 45 42 29  ALKPHOS 59 51 58  BILITOT 0.7 0.4 0.4  PROT 8.2* 6.4* 7.0  ALBUMIN 3.5 2.9* 3.2*    Recent Labs  11/03/16 0455  11/06/16 0900 11/24/16 0718 12/21/16 0500 01/06/17 0715  WBC 15.1*  < > 7.3 8.6 7.8  --   NEUTROABS 11.7*  --   --  5.0 4.2  --   HGB 13.5  < > 13.3 14.4 13.9  --   HCT 41.5  < > 40.2 44.3 44.1 44.5  MCV 91.6  < > 91.0 92.3 93.4  --   PLT  300  < > 256 308 280  --   < > = values in this interval not displayed. Lab Results  Component Value Date   TSH <0.010 (L) 10/27/2016   Lab Results  Component Value Date   HGBA1C  03/27/2009    6.0 (NOTE) The ADA recommends the following therapeutic goal for glycemic control related to Hgb A1c measurement: Goal of therapy: <6.5 Hgb A1c  Reference: American Diabetes Association: Clinical Practice Recommendations 2010, Diabetes Care, 2010, 33: (Suppl  1).   Lab Results  Component Value Date   CHOL 117 07/28/2016   HDL 33 (L) 07/28/2016   LDLCALC 75 07/28/2016   TRIG 45 07/28/2016   CHOLHDL 3.5 07/28/2016    Significant Diagnostic Results in last 30 days:  No results found.  Assessment/Plan There are no diagnoses linked to this encounter.   Family/ staff Communication:   Labs/tests ordered:       This encounter was created in error - please disregard.

## 2017-03-07 ENCOUNTER — Encounter (HOSPITAL_COMMUNITY)
Admission: RE | Admit: 2017-03-07 | Discharge: 2017-03-07 | Disposition: A | Discharge status: Home / Self Care | Payer: Medicare Other | Source: Skilled Nursing Facility | Attending: *Deleted | Admitting: *Deleted

## 2017-03-07 ENCOUNTER — Encounter: Payer: Self-pay | Admitting: Internal Medicine

## 2017-03-07 ENCOUNTER — Non-Acute Institutional Stay (SKILLED_NURSING_FACILITY): Payer: Medicare Other | Admitting: Internal Medicine

## 2017-03-07 DIAGNOSIS — I2782 Chronic pulmonary embolism: Secondary | ICD-10-CM | POA: Diagnosis not present

## 2017-03-07 DIAGNOSIS — I10 Essential (primary) hypertension: Secondary | ICD-10-CM | POA: Diagnosis not present

## 2017-03-07 DIAGNOSIS — I69998 Other sequelae following unspecified cerebrovascular disease: Secondary | ICD-10-CM | POA: Diagnosis not present

## 2017-03-07 DIAGNOSIS — G8191 Hemiplegia, unspecified affecting right dominant side: Secondary | ICD-10-CM | POA: Diagnosis not present

## 2017-03-07 DIAGNOSIS — Z7901 Long term (current) use of anticoagulants: Secondary | ICD-10-CM | POA: Diagnosis not present

## 2017-03-07 DIAGNOSIS — R609 Edema, unspecified: Secondary | ICD-10-CM | POA: Diagnosis not present

## 2017-03-07 DIAGNOSIS — J441 Chronic obstructive pulmonary disease with (acute) exacerbation: Secondary | ICD-10-CM

## 2017-03-07 DIAGNOSIS — I639 Cerebral infarction, unspecified: Secondary | ICD-10-CM | POA: Diagnosis not present

## 2017-03-07 DIAGNOSIS — I69328 Other speech and language deficits following cerebral infarction: Secondary | ICD-10-CM | POA: Diagnosis not present

## 2017-03-07 DIAGNOSIS — K562 Volvulus: Secondary | ICD-10-CM | POA: Diagnosis not present

## 2017-03-07 DIAGNOSIS — R293 Abnormal posture: Secondary | ICD-10-CM | POA: Diagnosis not present

## 2017-03-07 LAB — PROTIME-INR
INR: 3.06
Prothrombin Time: 31.4 seconds — ABNORMAL HIGH (ref 11.4–15.2)

## 2017-03-07 NOTE — Progress Notes (Signed)
Location:   [facility] Room Number: 112/W Place of Service:  SNF [phone]) Provider:  Candida Peeling, Freddie Breech, MD  [name], [name]  Extended Emergency Contact Information Primary Emergency Contact: [name] [name] Address: [address]          [place], [place] [address] Darden Amber of [place] Home Phone: [phone] Mobile Phone: [phone] Relation: Brother  Code Status:  Full Code Goals of care: Advanced Directive information Advanced Directives 03/07/2017  Does Patient Have a Medical Advance Directive? Yes  Type of Advance Directive (No Data)  Does patient want to make changes to medical advance directive? No - Patient declined  Copy of Healthcare Power of Attorney in Chart? -  Would patient like information on creating a medical advance directive? No - Patient declined     Chief Complaint  Patient presents with  . Medical Management of Chronic Issues    Routine Visit  For medical management of chronic medical conditions including COPD-history CVA-pulmonary embolism and DVT-hypertension-recurrent sigmoid volvulus-hypokalemia  HPI:  Pt is a 71 y.o. [name] seen today for medical management of chronic diseases as noted above-he has had appear to stability most recent acute issue was a recurrent sigmoid volvulus which required several hospital admissions he was not thought to be a surgical candidate and this appears to have stabilized significantly he is receiving senna as well as MiraLAX and this appears to be effective he is having regular bowel movements now.  His weight appears to be relatively stable in the mid 180s he has no complaints today.  Regards COPD continues on duo nebs as needed as well as Spiriva he has some chronic congested breath sounds but this is not new he does not complain of any shortness of breath or increased cough.  He is on Coumadin with a history of CVA as well as  distant history of pulmonary embolism DVT INR is supratherapeutic today at 3.06 Dr. Chales Abrahams has assess this and Coumadin will be held for a couple days and INR will be rechecked in 2 days.  There's been no increased bruising or bleeding.  Regards to hypertension this appears relatively stable he has quite a bit of variability today was list is 99/50 one I checked it later that day and he was 150/82-I do not see consistent elevations or lows at this point will monitor he is on lisinopril as well as labetalol.  He also has a history of hyperkalemia potassium dose was recently reduced last potassium was 4.9 back on August 6 we will recheck this to ensure stability.    Currently he is lying in bed comfortably taken a nap be has no complaints.     Past Medical History:  Diagnosis Date  . Dysphasia   . Fatty liver   . Hemiplegia (HCC)   . Hypertension   . Pneumonia   . Pulmonary embolism (HCC)   . 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(HCC)   . Stroke [facility])    Past Surgical History:  Procedure Laterality Date  . FLEXIBLE SIGMOIDOSCOPY N/A 10/21/2016   Procedure: FLEXIBLE SIGMOIDOSCOPY;  Surgeon: Malissa Hippo, MD;  [facility];  Service: Endoscopy;  Laterality: N/A;  . FLEXIBLE SIGMOIDOSCOPY N/A 10/25/2016   Procedure: FLEXIBLE SIGMOIDOSCOPY;  Surgeon: Malissa Hippo, MD;  Location: [facility];  Service: Endoscopy;  Laterality: N/A;  . unable      Allergies  Allergen Reactions  . Penicillins  Has patient had a PCN reaction causing immediate rash, facial/tongue/throat swelling, SOB or lightheadedness with hypotension: unknown Has patient had a PCN reaction causing severe rash involving mucus membranes or skin necrosis: unknown Has patient had a PCN reaction that required hospitalization: unknown Has patient had a PCN reaction occurring within the last 10 years: unknown If all of the above answers are "NO", then may proceed with Cephalosporin use. 5/3 Tolerated Cefepime x 1 dose in ED.      Outpatient Encounter Prescriptions as of 03/07/2017  Medication Sig  . acetaminophen (TYLENOL) 325 MG tablet Take 650 mg by mouth every 6 (six) hours as needed for moderate pain.   Marland Kitchen betamethasone dipropionate (DIPROLENE) 0.05 % cream Apply to legs and hands. Do not apply to face groin, axilla. Try Cerave first twice a day.  . carboxymethylcellulose (REFRESH PLUS) 0.5 % SOLN Place 1 drop into both eyes 2 (two) times daily.  . Emollient (CERAVE) CREA Apply 1 application topically daily as needed for itching once a day  . Eyelid Cleansers (SYSTANE LID WIPES EX) Apply 1 application topically 2 (two) times daily. Start occusoft lid scrubs: clean eyelids of oil and debris BID OU ( each eye ) before administering eye drops.   Marland Kitchen ipratropium (ATROVENT) 0.02 % nebulizer solution Take 0.5 mg by nebulization every 6 (six) hours.  Marland Kitchen labetalol (NORMODYNE) 200 MG tablet Take 200 mg by mouth daily. For HTN  . lisinopril (PRINIVIL,ZESTRIL) 20 MG tablet Take 20 mg by mouth daily.  Marland Kitchen loratadine (CLARITIN) 10 MG tablet Take 10 mg by mouth daily.  . polyethylene glycol (MIRALAX / GLYCOLAX) packet Take 17 g by mouth at bedtime.  . potassium chloride (K-DUR,KLOR-CON) 10 MEQ tablet Take 40 mEq by mouth daily.   . sennosides-docusate sodium (SENOKOT-S) 8.6-50 MG tablet Take 1 tablet by mouth at bedtime. For constipation  . tiotropium (SPIRIVA HANDIHALER) 18 MCG inhalation capsule Place 18 mcg into inhaler and inhale daily.  Marland Kitchen warfarin (COUMADIN) 3 MG tablet Take 3 mg by mouth daily.   Cliffton Asters Petrolatum-Mineral Oil (SYSTANE NIGHTTIME) OINT Place 1 application into the left eye at bedtime. Apply to left eye at night    No facility-administered encounter medications on file as of 03/07/2017.      Review of Systems  General is not complaining of any fever or chills.  Skin is not diaphoretic does not complain of rashes or itching.  Head ears eyes nose mouth and throat does not complaining any visual changes he  does over opacity of his left eye which is chronic-does not complain of sore throat or difficulty swallowing is on a dysphagia diet.  Respiratory does not complaining of any shortness of breath or cough on baseline he does have a history COPD but is not oxygen dependent.  Cardiac he denies any chest pain or palpitations has some lower extremity edema  on the left but does not complain of pain  GI is not complaining of any abdominal discomfort nausea vomiting diarrhea constipation again does have a history of sigmoid volvulus which is stabilized.  GU does not complain of dysuria.  Muscle skeletal has right-sided weakness with a history CVA but does not complaining of joint pain at times will complain of right arm pain with a mild contracture of his hand but is not complaining of that this evening.  Neurologic again history CVA with right-sided weakness is not complaining of headache dizziness or syncope continues to ambulate about facility in his wheelchair.  Psych does not  complain of any depression or anxiety appears to do well with supportive care is motivated to get out of bed and ambulate in his wheelchair   Immunization History  Administered Date(s) Administered  . Influenza-Unspecified 03/28/2014, 03/23/2016  . Pneumococcal-Unspecified 11/01/2009, 03/30/2016   Pertinent  Health Maintenance Due  Topic Date Due  . INFLUENZA VACCINE  05/21/2017 (Originally 01/19/2017)  . COLONOSCOPY  10/30/2025 (Originally 11/08/1995)  . PNA vac Low Risk Adult (2 of 2 - PCV13) 03/30/2017   No flowsheet data found. Functional Status Survey:    Vitals:   03/07/17 1453  BP: (!) 99/51  Pulse: 90  Resp: 18  Temp: 98.6 F (37 C)  TempSrc: Oral  SpO2: 94%   Manual blood pressure as noted above was 150/82-weight is relatively stable at 183.6 Physical Exam Gen. this is a pleasant elderly [name] in no distress lying comfortably in bed.  His skin is warm and dry.  Eyes has opacity in the left eye a  history of pulse which is not new right eye visual acuity appears grossly intact pupils reactive to light.  Oropharynx is clear mucous membranes moist.  Chest he does have scattered rhonchi which is baseline there is no labored breathing.  Heart is regular rate and rhythm without murmur gallop or rub he does have some increased left lower extremity edema-with somewhat reduced pedal pulse there is no tenderness to palpation or erythema.  Abdomen is soft nontender with active bowel sounds it is soft.  Muscle skeletal continues with right-sided weakness but is able to move his right upper and lower extremities weaker than the left however-there is a mild contracture of his right hand.  Continues to ambulate in wheelchair and does well with this  Neurologic again continues with right-sided weakness which is chronic does have dysarthric speech which also is chronic moves left upper and lower extremities at baseline and actuallyambulates in  wheelchair using his left side and it does quite well  Psych he is alert and oriented pleasant and appropriate Labs reviewed:  Recent Labs  10/29/16 0412  11/01/16 0700  11/04/16 0438  12/21/16 0500 01/06/17 0715 01/24/17 1045  NA 147*  < > 146*  < > 143  < > 139 139 140  K 2.6*  < > 2.8*  < > 2.9*  < > 4.7 4.4 4.9  CL 115*  < > 110  < > 107  < > 106 104 106  CO2 28  < > 29  < > 31  < > GLUCOSE 96  < > 82  < > 90  < > 88 86 89  BUN 9  < > 14  < > 12  < > 28* 21* 25*  CREATININE 0.89  < > 0.85  < > 0.86  < > 1.16 1.03 1.12  CALCIUM 7.9*  < > 8.1*  < > 7.9*  < > 8.5* 8.8* 9.1  MG 1.6*  --  1.8  --  1.6*  --   --   --   --   < > = values in this interval not displayed.  Recent Labs  10/21/16 0914 11/03/16 0455 12/21/16 0500  AST 38 26 23  ALT 45 42 29  ALKPHOS 59 51 58  BILITOT 0.7 0.4 0.4  PROT 8.2* 6.4* 7.0  ALBUMIN 3.5 2.9* 3.2*    Recent Labs  11/03/16 0455  11/06/16 0900 11/24/16 0718 12/21/16 0500 01/06/17 0715    WBC 15.1*  < >  7.3 8.6 7.8  --   NEUTROABS 11.7*  --   --  5.0 4.2  --   HGB 13.5  < > 13.3 14.4 13.9  --   HCT 41.5  < > 40.2 44.3 44.1 44.5  MCV 91.6  < > 91.0 92.3 93.4  --   PLT 300  < > 256 308 280  --   < > = values in this interval not displayed. Lab Results  Component Value Date   TSH <0.010 (L) 10/27/2016   Lab Results  Component Value Date   HGBA1C  03/27/2009    6.0 (NOTE) The ADA recommends the following therapeutic goal for glycemic control related to Hgb A1c measurement: Goal of therapy: <6.5 Hgb A1c  Reference: American Diabetes Association: Clinical Practice Recommendations 2010, Diabetes Care, 2010, 33: (Suppl  1).   Lab Results  Component Value Date   CHOL 117 07/28/2016   HDL 33 (L) 07/28/2016   LDLCALC 75 07/28/2016   TRIG 45 07/28/2016   CHOLHDL 3.5 07/28/2016    Significant Diagnostic Results in last 30 days:  No results found.  Assessment/Plan    #1 history of     I recurrent sigmoid volvulus this was quite an issue earlier this year but this appears to have markedly stabilized he is on MiraLAX and Senokotshe is followed by GI but this appears stabilized appears he is eating better having regular bowel movements which is encouraging   #2 history of CVA with right-sided weakness he appears to be doing well with supportive care still is motivated often ambulating in the wheelchair in the hallway-he is on Coumadin INR is somewhat supratherapeutic at 3.06 today this was assessed by Dr. Chales Abrahams and Coumadin will be held for 2 days and INR rechecked very  #3 history of pulmonary embolism and DVT past again he is on chronic Coumadin as noted above   number 4 history hypertension appears has some variability manual reading today was somewhat elevated at 150/82-year-earlier reading was somewhat low at 99/51 at this point will monitor not consistent highs or lows he is not symptomatic of significant hypotension per nursing-will continue labetalol and lisinopril  at current doses for now and monitor.  #5-history of COPD this appears stable on Spiriva-also is due nebs as needed when necessary.  Is not complaining of any shortness breath or increased cough.  #6 history hypokalemia potassium was reduced last month secondary to him not being on a diuretic and concern for hyperkalemia-will update a BMP tomorrow to ensure stability.  #7-history of increased left lower extremity edema he is already anticoagulated as noted above with Coumadin but edema appears to be increased on the left will update a venous Doppler to rule out any DVT despite being on anticoagulation.  Number 8 hyperlipidemia? In the past so was some question of patient having elevated lipidsl statin was not thought to be a good option because of possible fatty liver disease--LDL was 75 back on lab in February we will update this--her function tests back in July 2018 were fairly unremarkable albumin was 3.2 otherwise within normal limits   #9 allergic rhinitis continues on Claritin at times says has been in issue complicated with his COPD but the Claritin appears to have helped long-term    [id number]-of note greater than 35 minutes spent assessing patient-reviewing his chart-discussing his status with nursing staff-reviewing his labs-and coordinating and formulating plan of care for numerous diagnoses-of note greater than 50% of time spent coordinating plan of care

## 2017-03-08 ENCOUNTER — Non-Acute Institutional Stay (SKILLED_NURSING_FACILITY): Payer: Medicare Other | Admitting: Internal Medicine

## 2017-03-08 ENCOUNTER — Encounter: Payer: Self-pay | Admitting: Internal Medicine

## 2017-03-08 DIAGNOSIS — I2782 Chronic pulmonary embolism: Secondary | ICD-10-CM

## 2017-03-08 DIAGNOSIS — J449 Chronic obstructive pulmonary disease, unspecified: Secondary | ICD-10-CM | POA: Diagnosis not present

## 2017-03-08 DIAGNOSIS — E876 Hypokalemia: Secondary | ICD-10-CM | POA: Diagnosis not present

## 2017-03-08 DIAGNOSIS — R0989 Other specified symptoms and signs involving the circulatory and respiratory systems: Secondary | ICD-10-CM | POA: Diagnosis not present

## 2017-03-08 DIAGNOSIS — R600 Localized edema: Secondary | ICD-10-CM | POA: Diagnosis not present

## 2017-03-08 DIAGNOSIS — I10 Essential (primary) hypertension: Secondary | ICD-10-CM

## 2017-03-08 DIAGNOSIS — R05 Cough: Secondary | ICD-10-CM | POA: Diagnosis not present

## 2017-03-08 NOTE — Progress Notes (Signed)
Location:   [facility] Room Number: 112/W Place of Service:  SNF [phone]) Provider:  Lenon Curt, Freddie Breech, MD  [name], [name]  Extended Emergency Contact Information Primary Emergency Contact: [name] Address: [address]          [place], [place] [address] [place] of [place] Home Phone: [phone] Mobile Phone: [phone] Relation: Brother  Code Status:  Full Code Goals of care: Advanced Directive information Advanced Directives 03/08/2017  Does Patient Have a Medical Advance Directive? Yes  Type of Advance Directive (No Data)  Does patient want to make changes to medical advance directive? No - Patient declined  Copy of Healthcare Power of Attorney in Chart? -  Would patient like information on creating a medical advance directive? No - Patient declined     Chief Complaint  Patient presents with  . Acute Visit    Low O2 Stats    HPI:  Pt is a 71 y.o. male seen today for an acute visit for episode of SOB last Nite.  He Has h/o COPD, CVA with Right Sided Hemiparesis, h/o PE and DVT on chronic Coumadin therapy, Hypertension and recurrent Sigmoid Volvulus, Dysphagia.  Per Night Nurses and Patient he got SOB last nite. His POX was in 80 on RA. He now feels better. POX is 93 % on RA. He continues to have cough which is chronic. He denies any SOB No fever or chills His weight is 183 lbs. Which is less then his 186 lbs. He is on dysphagic Diet.  Past Medical History:  Diagnosis Date  . Dysphasia   . Fatty liver   . Hemiplegia (HCC)   . Hypertension   . Pneumonia   . Pulmonary embolism (HCC)   . 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(HCC)   . Stroke [facility])    Past Surgical History:  Procedure Laterality Date  . FLEXIBLE SIGMOIDOSCOPY N/A 10/21/2016   Procedure: FLEXIBLE SIGMOIDOSCOPY;  Surgeon: Malissa Hippo, MD;  [facility];  Service: Endoscopy;  Laterality: N/A;  .  FLEXIBLE SIGMOIDOSCOPY N/A 10/25/2016   Procedure: FLEXIBLE SIGMOIDOSCOPY;  Surgeon: Malissa Hippo, MD;  [facility];  Service: Endoscopy;  Laterality: N/A;  . unable      Allergies  Allergen Reactions  . Penicillins     Has patient had a PCN reaction causing immediate rash, facial/tongue/throat swelling, SOB or lightheadedness with hypotension: unknown Has patient had a PCN reaction causing severe rash involving mucus membranes or skin necrosis: unknown Has patient had a PCN reaction that required hospitalization: unknown Has patient had a PCN reaction occurring within the last 10 years: unknown If all of the above answers are "NO", then may proceed with Cephalosporin use. 5/3 Tolerated Cefepime x 1 dose in ED.     Outpatient Encounter Prescriptions as of 03/08/2017  Medication Sig  . acetaminophen (TYLENOL) 325 MG tablet Take 650 mg by mouth every 6 (six) hours as needed for moderate pain.   Marland Kitchen betamethasone dipropionate (DIPROLENE) 0.05 % cream Apply to legs and hands. Do not apply to face groin, axilla. Try Cerave first twice a day.  . carboxymethylcellulose (REFRESH PLUS) 0.5 % SOLN Place 1 drop into both eyes 2 (two) times daily.  . Emollient (CERAVE) CREA Apply 1 application topically daily as needed for itching once a day  . Eyelid Cleansers (SYSTANE LID WIPES EX) Apply 1 application topically 2 (two) times daily. Start occusoft lid  scrubs: clean eyelids of oil and debris BID OU ( each eye ) before administering eye drops.   Marland Kitchen ipratropium (ATROVENT) 0.02 % nebulizer solution Take 0.5 mg by nebulization every 6 (six) hours.  Marland Kitchen labetalol (NORMODYNE) 200 MG tablet Take 200 mg by mouth daily. For HTN  . lisinopril (PRINIVIL,ZESTRIL) 20 MG tablet Take 20 mg by mouth daily.  Marland Kitchen loratadine (CLARITIN) 10 MG tablet Take 10 mg by mouth daily.  . polyethylene glycol (MIRALAX / GLYCOLAX) packet Take 17 g by mouth at bedtime.  . potassium chloride (K-DUR,KLOR-CON) 10 MEQ tablet Take  40 mEq by mouth daily.   . sennosides-docusate sodium (SENOKOT-S) 8.6-50 MG tablet Take 1 tablet by mouth at bedtime. For constipation  . tiotropium (SPIRIVA HANDIHALER) 18 MCG inhalation capsule Place 18 mcg into inhaler and inhale daily.  Cliffton Asters Petrolatum-Mineral Oil (SYSTANE NIGHTTIME) OINT Place 1 application into the left eye at bedtime. Apply to left eye at night   . [DISCONTINUED] warfarin (COUMADIN) 3 MG tablet Take 3 mg by mouth daily.    No facility-administered encounter medications on file as of 03/08/2017.      Review of Systems  Constitutional: Negative.   HENT: Negative.   Respiratory: Positive for cough. Negative for chest tightness and shortness of breath.   Cardiovascular: Positive for leg swelling.  Gastrointestinal: Negative.   Genitourinary: Negative.   Musculoskeletal: Negative.     Immunization History  Administered Date(s) Administered  . Influenza-Unspecified 03/28/2014, 03/23/2016  . Pneumococcal-Unspecified 11/01/2009, 03/30/2016   Pertinent  Health Maintenance Due  Topic Date Due  . INFLUENZA VACCINE  05/21/2017 (Originally 01/19/2017)  . COLONOSCOPY  10/30/2025 (Originally 11/08/1995)  . PNA vac Low Risk Adult (2 of 2 - PCV13) 03/30/2017   No flowsheet data found. Functional Status Survey:    Vitals:   03/08/17 1021  BP: 104/65  Pulse: 89  Resp: (!) 22  Temp: (!) 97.1 F (36.2 C)  TempSrc: Oral  SpO2: 93%   There is no height or weight on file to calculate BMI. Physical Exam  Constitutional: He appears well-developed.  HENT:  Head: Normocephalic.  Mouth/Throat: Oropharynx is clear and moist.  Neck: Neck supple.  Cardiovascular: Normal rate and normal heart sounds.   Pulmonary/Chest: Effort normal and breath sounds normal. He has no wheezes. He has no rales.  Abdominal: Soft. Bowel sounds are normal. He exhibits no distension. There is no tenderness. There is no rebound.  Musculoskeletal:  Mild edema Bilateral with Chronic venous  Stasis  Neurological: He is alert.  Skin: Skin is warm and dry.  Psychiatric: He has a normal mood and affect. His behavior is normal.    Labs reviewed:  Recent Labs  10/29/16 0412  11/01/16 0700  11/04/16 0438  12/21/16 0500 01/06/17 0715 01/24/17 1045  NA 147*  < > 146*  < > 143  < > 139 139 140  K 2.6*  < > 2.8*  < > 2.9*  < > 4.7 4.4 4.9  CL 115*  < > 110  < > 107  < > 106 104 106  CO2 28  < > 29  < > 31  < > GLUCOSE 96  < > 82  < > 90  < > 88 86 89  BUN 9  < > 14  < > 12  < > 28* 21* 25*  CREATININE 0.89  < > 0.85  < > 0.86  < > 1.16 1.03 1.12  CALCIUM 7.9*  < >  8.1*  < > 7.9*  < > 8.5* 8.8* 9.1  MG 1.6*  --  1.8  --  1.6*  --   --   --   --   < > = values in this interval not displayed.  Recent Labs  10/21/16 0914 11/03/16 0455 12/21/16 0500  AST 38 26 23  ALT 45 42 29  ALKPHOS 59 51 58  BILITOT 0.7 0.4 0.4  PROT 8.2* 6.4* 7.0  ALBUMIN 3.5 2.9* 3.2*    Recent Labs  11/03/16 0455  11/06/16 0900 11/24/16 0718 12/21/16 0500 01/06/17 0715  WBC 15.1*  < > 7.3 8.6 7.8  --   NEUTROABS 11.7*  --   --  5.0 4.2  --   HGB 13.5  < > 13.3 14.4 13.9  --   HCT 41.5  < > 40.2 44.3 44.1 44.5  MCV 91.6  < > 91.0 92.3 93.4  --   PLT 300  < > 256 308 280  --   < > = values in this interval not displayed. Lab Results  Component Value Date   TSH <0.010 (L) 10/27/2016   Lab Results  Component Value Date   HGBA1C  03/27/2009    6.0 (NOTE) The ADA recommends the following therapeutic goal for glycemic control related to Hgb A1c measurement: Goal of therapy: <6.5 Hgb A1c  Reference: American Diabetes Association: Clinical Practice Recommendations 2010, Diabetes Care, 2010, 33: (Suppl  1).   Lab Results  Component Value Date   CHOL 117 07/28/2016   HDL 33 (L) 07/28/2016   LDLCALC 75 07/28/2016   TRIG 45 07/28/2016   CHOLHDL 3.5 07/28/2016    Significant Diagnostic Results in last 30 days:  No results found.  Assessment/Plan COPD  Patient stable.  Continue on Spiriva. Chest Xray was negative for any infiltrate. His BNP 2 weeks ago was normal.   Essential hypertension Patient BP has been running on lower side and with his Cough will discontinue Lisinopril. Continue Labetalol For now   Hypokalemia On Potassium supplement Repeat BMP pending Chronic PE On Coumadin. Repeat INR pending as it was 3 yesterday. Low TSH Repeat TSH and free T3 T4  Family/ staff Communication:   Labs/tests ordered:  TSH, BMP, CBC, INR

## 2017-03-09 ENCOUNTER — Encounter (HOSPITAL_COMMUNITY)
Admission: RE | Admit: 2017-03-09 | Discharge: 2017-03-09 | Disposition: A | Discharge status: Home / Self Care | Payer: Medicare Other | Source: Skilled Nursing Facility | Attending: Internal Medicine | Admitting: Internal Medicine

## 2017-03-09 DIAGNOSIS — Z7901 Long term (current) use of anticoagulants: Secondary | ICD-10-CM | POA: Insufficient documentation

## 2017-03-09 DIAGNOSIS — J449 Chronic obstructive pulmonary disease, unspecified: Secondary | ICD-10-CM | POA: Insufficient documentation

## 2017-03-09 DIAGNOSIS — I10 Essential (primary) hypertension: Secondary | ICD-10-CM | POA: Insufficient documentation

## 2017-03-09 DIAGNOSIS — M6281 Muscle weakness (generalized): Secondary | ICD-10-CM | POA: Insufficient documentation

## 2017-03-09 DIAGNOSIS — R1312 Dysphagia, oropharyngeal phase: Secondary | ICD-10-CM | POA: Diagnosis not present

## 2017-03-09 DIAGNOSIS — I69328 Other speech and language deficits following cerebral infarction: Secondary | ICD-10-CM | POA: Diagnosis not present

## 2017-03-09 DIAGNOSIS — K562 Volvulus: Secondary | ICD-10-CM | POA: Insufficient documentation

## 2017-03-09 DIAGNOSIS — E876 Hypokalemia: Secondary | ICD-10-CM | POA: Diagnosis not present

## 2017-03-09 LAB — CBC WITH DIFFERENTIAL/PLATELET
Basophils Absolute: 0 10*3/uL (ref 0.0–0.1)
Basophils Relative: 0 %
EOS ABS: 0.5 10*3/uL (ref 0.0–0.7)
Eosinophils Relative: 6 %
HEMATOCRIT: 46.2 % (ref 39.0–52.0)
HEMOGLOBIN: 15.1 g/dL (ref 13.0–17.0)
Lymphocytes Relative: 29 %
Lymphs Abs: 2.3 10*3/uL (ref 0.7–4.0)
MCH: 29.9 pg (ref 26.0–34.0)
MCHC: 32.7 g/dL (ref 30.0–36.0)
MCV: 91.5 fL (ref 78.0–100.0)
MONO ABS: 0.5 10*3/uL (ref 0.1–1.0)
MONOS PCT: 7 %
NEUTROS PCT: 58 %
Neutro Abs: 4.5 10*3/uL (ref 1.7–7.7)
Platelets: 269 10*3/uL (ref 150–400)
RBC: 5.05 MIL/uL (ref 4.22–5.81)
RDW: 12.7 % (ref 11.5–15.5)
WBC: 7.7 10*3/uL (ref 4.0–10.5)

## 2017-03-09 LAB — LIPID PANEL
CHOL/HDL RATIO: 3.4 ratio
CHOLESTEROL: 106 mg/dL (ref 0–200)
HDL: 31 mg/dL — ABNORMAL LOW (ref >40)
LDL CALC: 67 mg/dL (ref 0–99)
Triglycerides: 42 mg/dL (ref <150)
VLDL: 8 mg/dL (ref 0–40)

## 2017-03-09 LAB — PROTIME-INR
INR: 1.93
PROTHROMBIN TIME: 21.9 s — AB (ref 11.4–15.2)

## 2017-03-09 LAB — BASIC METABOLIC PANEL
Anion gap: 8 (ref 5–15)
BUN: 24 mg/dL — AB (ref 6–20)
CHLORIDE: 105 mmol/L (ref 101–111)
CO2: 26 mmol/L (ref 22–32)
CREATININE: 0.99 mg/dL (ref 0.61–1.24)
Calcium: 8.7 mg/dL — ABNORMAL LOW (ref 8.9–10.3)
GFR calc Af Amer: >60 mL/min (ref >60)
GFR calc non Af Amer: >60 mL/min (ref >60)
GLUCOSE: 84 mg/dL (ref 65–99)
POTASSIUM: 4 mmol/L (ref 3.5–5.1)
Sodium: 139 mmol/L (ref 135–145)

## 2017-03-09 LAB — TSH

## 2017-03-10 LAB — T3: T3 TOTAL: 163 ng/dL (ref 71–180)

## 2017-03-10 LAB — T4: T4, Total: 11.3 ug/dL (ref 4.5–12.0)

## 2017-03-11 ENCOUNTER — Non-Acute Institutional Stay (SKILLED_NURSING_FACILITY): Payer: Medicare Other | Admitting: Internal Medicine

## 2017-03-11 ENCOUNTER — Encounter (HOSPITAL_COMMUNITY)
Admission: RE | Admit: 2017-03-11 | Discharge: 2017-03-11 | Disposition: A | Discharge status: Home / Self Care | Payer: Medicare Other | Source: Skilled Nursing Facility | Attending: Internal Medicine | Admitting: Internal Medicine

## 2017-03-11 ENCOUNTER — Encounter: Payer: Self-pay | Admitting: Internal Medicine

## 2017-03-11 DIAGNOSIS — K562 Volvulus: Secondary | ICD-10-CM | POA: Insufficient documentation

## 2017-03-11 DIAGNOSIS — Z7901 Long term (current) use of anticoagulants: Secondary | ICD-10-CM | POA: Diagnosis not present

## 2017-03-11 DIAGNOSIS — E876 Hypokalemia: Secondary | ICD-10-CM | POA: Diagnosis not present

## 2017-03-11 DIAGNOSIS — M6281 Muscle weakness (generalized): Secondary | ICD-10-CM | POA: Insufficient documentation

## 2017-03-11 DIAGNOSIS — I639 Cerebral infarction, unspecified: Secondary | ICD-10-CM | POA: Diagnosis not present

## 2017-03-11 DIAGNOSIS — I10 Essential (primary) hypertension: Secondary | ICD-10-CM

## 2017-03-11 DIAGNOSIS — I69328 Other speech and language deficits following cerebral infarction: Secondary | ICD-10-CM | POA: Diagnosis not present

## 2017-03-11 DIAGNOSIS — R1312 Dysphagia, oropharyngeal phase: Secondary | ICD-10-CM | POA: Insufficient documentation

## 2017-03-11 DIAGNOSIS — J449 Chronic obstructive pulmonary disease, unspecified: Secondary | ICD-10-CM | POA: Insufficient documentation

## 2017-03-11 LAB — PROTIME-INR
INR: 1.27
PROTHROMBIN TIME: 15.8 s — AB (ref 11.4–15.2)

## 2017-03-11 NOTE — Progress Notes (Signed)
Location:   [facility] Room Number: 112/W Place of Service:  SNF [id number]) Provider:  Sabino Dick, MD  [name], [name]  Extended Emergency Contact Information Primary Emergency Contact: [name] Address: [address]          [place], [place] [address] D[place] of [place] Home Phone: [phone] Mobile Phone: [phone] Relation: Brother  Code Status:  Full Code Goals of care: Advanced Directive information Advanced Directives 03/11/2017  Does Patient Have a Medical Advance Directive? Yes  Type of Advance Directive (No Data)  Does patient want to make changes to medical advance directive? No - Patient declined  Copy of Healthcare Power of Attorney in Chart? -  Would patient like information on creating a medical advance directive? No - Patient declined     Chief Complaint  Patient presents with  . Acute Visit    F/U INR  With subtherapeutic INR  HPI:  Pt is a 71 y.o. male seen today for an acute visit for follow-up on no of 1.27.    He is on chronic anticoagulation with Coumadin with history of CVA pulmonary embolism and DVT in the past-INR was supratherapeutic earlier this week at 3.06 back on the 18th and was held for a couple days and then restarted yesterday with an INR 1.93 he was started on 2-1/2 mg-baseline had been 3 mg before he went supratherapeutic.  He was also seen earlier this week for possible hypoxia and cough but this apparently was baseline a chest x-ray was unremarkable he does not complain of any increased cough or shortness of breath today.     Past Medical History:  Diagnosis Date  . Dysphasia   . Fatty liver   . Hemiplegia (HCC)   . Hypertension   . Pneumonia   . Pulmonary embolism (HCC)   . 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(HCC)   . Stroke [facility])    Past Surgical History:  Procedure Laterality Date  . FLEXIBLE SIGMOIDOSCOPY N/A 10/21/2016   Procedure: FLEXIBLE SIGMOIDOSCOPY;  Surgeon: Malissa Hippo, MD;  Location: [facility];  Service: Endoscopy;  Laterality: N/A;  . FLEXIBLE SIGMOIDOSCOPY N/A 10/25/2016   Procedure: FLEXIBLE SIGMOIDOSCOPY;  Surgeon: Malissa Hippo, MD;  Location: [facility];  Service: Endoscopy;  Laterality: N/A;  . unable      Allergies  Allergen Reactions  . Penicillins     Has patient had a PCN reaction causing immediate rash, facial/tongue/throat swelling, SOB or lightheadedness with hypotension: unknown Has patient had a PCN reaction causing severe rash involving mucus membranes or skin necrosis: unknown Has patient had a PCN reaction that required hospitalization: unknown Has patient had a PCN reaction occurring within the last 10 years: unknown If all of the above answers are "NO", then may proceed with Cephalosporin use. 5/3 Tolerated Cefepime x 1 dose in ED.     Outpatient Encounter Prescriptions as of 03/11/2017  Medication Sig  . acetaminophen (TYLENOL) 325 MG tablet Take 650 mg by mouth every 6 (six) hours as needed for moderate pain.   Marland Kitchen betamethasone dipropionate (DIPROLENE) 0.05 % cream Apply to legs and hands. Do not apply to face groin, axilla. Try Cerave first twice a day.  . carboxymethylcellulose (REFRESH PLUS) 0.5 % SOLN Place 1 drop into both eyes 2 (two) times daily.  . Emollient (CERAVE) CREA Apply 1 application topically daily as needed for itching once a day  . Eyelid  Cleansers (SYSTANE LID WIPES EX) Apply 1 application topically 2 (two) times daily. Start occusoft lid scrubs: clean eyelids of oil and debris BID OU ( each eye ) before administering eye drops.   Marland Kitchen ipratropium (ATROVENT) 0.02 % nebulizer solution Take 0.5 mg by nebulization every 6 (six) hours.  Marland Kitchen labetalol (NORMODYNE) 200 MG tablet Take 200 mg by mouth daily. For HTN  . lisinopril (PRINIVIL,ZESTRIL) 20 MG tablet Take 20 mg by mouth daily.  Marland Kitchen loratadine (CLARITIN) 10 MG tablet Take 10 mg by mouth daily.  .  polyethylene glycol (MIRALAX / GLYCOLAX) packet Take 17 g by mouth at bedtime.  . potassium chloride (K-DUR,KLOR-CON) 10 MEQ tablet Take 40 mEq by mouth daily.   . sennosides-docusate sodium (SENOKOT-S) 8.6-50 MG tablet Take 1 tablet by mouth at bedtime. For constipation  . tiotropium (SPIRIVA HANDIHALER) 18 MCG inhalation capsule Place 18 mcg into inhaler and inhale daily.  Marland Kitchen warfarin (COUMADIN) 1 MG tablet Take 0.5 mg by mouth one time only at 6 PM. On 03/11/2017  . warfarin (COUMADIN) 3 MG tablet Take 3 mg by mouth daily.  Cliffton Asters Petrolatum-Mineral Oil (SYSTANE NIGHTTIME) OINT Place 1 application into the left eye at bedtime. Apply to left eye at night    No facility-administered encounter medications on file as of 03/11/2017.     Review of Systems   General does not complaining any fever or chills.  Skin does not complain increased bruising or bleeding.  Head ears eyes nose mouth and throat does not complain of visual changes beyond baseline has prescription lenses as chronic opacity of the left eye.  Respirations not complaining of any shortness breath or increased cough.  Cardiac denies chest pain or increased lower extremity edema from baseline.  GI does not complain of any abdominal discomfort nausea vomiting diarrhea or constipation.  Muscle skeletal continues with right-sided weakness status post CVA continues to ambulate in a wheelchair.  Neurologic has right-sided weakness status post CVA but does not complaining any dizziness headache or numbness.    Immunization History  Administered Date(s) Administered  . Influenza-Unspecified 03/28/2014, 03/23/2016  . Pneumococcal-Unspecified 11/01/2009, 03/30/2016   Pertinent  Health Maintenance Due  Topic Date Due  . INFLUENZA VACCINE  05/21/2017 (Originally 01/19/2017)  . COLONOSCOPY  10/30/2025 (Originally 11/08/1995)  . PNA vac Low Risk Adult (2 of 2 - PCV13) 03/30/2017   No flowsheet data found. Functional Status  Survey:    Vitals:   03/11/17 1624  BP: (!) 160/96  Pulse: 72  Resp: 18    Physical Exam   In general this is a pleasant elderly male in no distress.  His skin is warm and dry do not note any increased bruising or bleeding.  Eyes does have opacity of the left eye chronic sclera and conjunctiva did not show any increased erythema.  He has prescription lenses.  Oropharynx is clear mucous membranes moist I do not note any bleeding.  Chest-he does have some baseline rhonchi on expiration which is not new is no labored breathing  Heart is regular rate and rhythm without murmur gallop or rub he has chronic lower extremity edema.  Abdomen is soft nontender with positive bowel sounds.  Muscle skeletal again has right-sided weakness does have some movement of his right arm and leg does have contracture of his right hand area.  Neurologic as noted above he does have somewhat slurred speech but understandable and has right-sided weakness but does ambulate well in a wheelchair largely using his left  extremities for propulsion.  Psych he is alert and oriented pleasant and appropriate    Labs reviewed:  Recent Labs  10/29/16 0412  11/01/16 0700  11/04/16 0438  01/06/17 0715 01/24/17 1045 03/09/17 0711  NA 147*  < > 146*  < > 143  < > 139 140 139  K 2.6*  < > 2.8*  < > 2.9*  < > 4.4 4.9 4.0  CL 115*  < > 110  < > 107  < > 104 106 105  CO2 28  < > 29  < > 31  < > GLUCOSE 96  < > 82  < > 90  < > 86 89 84  BUN 9  < > 14  < > 12  < > 21* 25* 24*  CREATININE 0.89  < > 0.85  < > 0.86  < > 1.03 1.12 0.99  CALCIUM 7.9*  < > 8.1*  < > 7.9*  < > 8.8* 9.1 8.7*  MG 1.6*  --  1.8  --  1.6*  --   --   --   --   < > = values in this interval not displayed.  Recent Labs  10/21/16 0914 11/03/16 0455 12/21/16 0500  AST 38 26 23  ALT 45 42 29  ALKPHOS 59 51 58  BILITOT 0.7 0.4 0.4  PROT 8.2* 6.4* 7.0  ALBUMIN 3.5 2.9* 3.2*    Recent Labs  11/24/16 0718 12/21/16 0500  01/06/17 0715 03/09/17 0711  WBC 8.6 7.8  --  7.7  NEUTROABS 5.0 4.2  --  4.5  HGB 14.4 13.9  --  15.1  HCT 44.3 44.1 44.5 46.2  MCV 92.3 93.4  --  91.5  PLT 308 280  --  269   Lab Results  Component Value Date   TSH <0.010 (L) 03/09/2017   Lab Results  Component Value Date   HGBA1C  03/27/2009    6.0 (NOTE) The ADA recommends the following therapeutic goal for glycemic control related to Hgb A1c measurement: Goal of therapy: <6.5 Hgb A1c  Reference: American Diabetes Association: Clinical Practice Recommendations 2010, Diabetes Care, 2010, 33: (Suppl  1).   Lab Results  Component Value Date   CHOL 106 03/09/2017   HDL 31 (L) 03/09/2017   LDLCALC 67 03/09/2017   TRIG 42 03/09/2017   CHOLHDL 3.4 03/09/2017    Significant Diagnostic Results in last 30 days:  No results found.  Assessment/Plan  1 subtherapeutic INR with history of PE and CVA-this is in contact 7 elevated INR earlier this week Coumadin was held for a couple days suspect there is a residual effect-baseline Coumadin previously was 3 but he became again somewhat supratherapeutic-he did receive 2-1/2 mg of Coumadin last night but INR continues to go down 1.27-will give him a boost of 3.5 mg of Coumadin tonight and then 3 mg daily through the weekend and recheck an INR on Monday, September 24.  Clinically appears stable.  #2-history of cough hypoxia history of COPD again has previously suspect Dr. Chales Abrahams this appears to have not been an acute issue chest x-ray was negative he does not complain of any increased shortness breath or cough beyond baseline today. He does continue on Spiriva as well as nebulizers when necessary  #3-hypertension-systolic is somewhat elevated at 160 today however he just got put to bed after using the lift and I suspect there is some transitory hypertension from all the movement-he is on labetalol 200  mg a day as well as lisinopril 20 mg a day.  Dr. Chales Abrahams had considered eliminating  lisinopril because of some lower blood pressures at times but with elevated reading today Will continue the lisinopril for now and monitor.  [id number]  Of note greater than 25 minutes spent assessing patient-reviewing his chart-reviewing his labs-discussing his status with nursing staff-and coordinating and formulating plan of care-of note greater than 50% of time spent coordinating  plan of care with input as noted above

## 2017-03-14 ENCOUNTER — Encounter (HOSPITAL_COMMUNITY)
Admission: RE | Admit: 2017-03-14 | Discharge: 2017-03-14 | Disposition: A | Discharge status: Home / Self Care | Payer: Medicare Other | Source: Skilled Nursing Facility | Attending: *Deleted | Admitting: *Deleted

## 2017-03-14 DIAGNOSIS — I69998 Other sequelae following unspecified cerebrovascular disease: Secondary | ICD-10-CM | POA: Diagnosis not present

## 2017-03-14 DIAGNOSIS — G8191 Hemiplegia, unspecified affecting right dominant side: Secondary | ICD-10-CM | POA: Diagnosis not present

## 2017-03-14 DIAGNOSIS — I10 Essential (primary) hypertension: Secondary | ICD-10-CM | POA: Diagnosis not present

## 2017-03-14 DIAGNOSIS — Z7901 Long term (current) use of anticoagulants: Secondary | ICD-10-CM | POA: Diagnosis not present

## 2017-03-14 DIAGNOSIS — R293 Abnormal posture: Secondary | ICD-10-CM | POA: Diagnosis not present

## 2017-03-14 LAB — PROTIME-INR
INR: 2.04
Prothrombin Time: 22.9 seconds — ABNORMAL HIGH (ref 11.4–15.2)

## 2017-03-17 ENCOUNTER — Non-Acute Institutional Stay (SKILLED_NURSING_FACILITY): Payer: Medicare Other

## 2017-03-17 DIAGNOSIS — Z0000 Encounter for general adult medical examination without abnormal findings: Secondary | ICD-10-CM | POA: Diagnosis not present

## 2017-03-17 NOTE — Patient Instructions (Signed)
[name] [name] , Thank you for taking time to come for your Medicare Wellness Visit. I appreciate your ongoing commitment to your health goals. Please review the following plan we discussed and let me know if I can assist you in the future.   Screening recommendations/referrals: Colonoscopy excluded, pt long term Recommended yearly ophthalmology/optometry visit for glaucoma screening and checkup Recommended yearly dental visit for hygiene and checkup  Vaccinations: Influenza vaccine due Pneumococcal vaccine up to date Tdap vaccine due Shingles vaccine not in records    Advanced directives: Need a copy for chart  Conditions/risks identified: None  Next appointment: Dr. Chales Abrahams makes rounds  Preventive Care 65 Years and Older, Male Preventive care refers to lifestyle choices and visits with your health care provider that can promote health and wellness. What does preventive care include?  A yearly physical exam. This is also called an annual well check.  Dental exams once or twice a year.  Routine eye exams. Ask your health care provider how often you should have your eyes checked.  Personal lifestyle choices, including:  Daily care of your teeth and gums.  Regular physical activity.  Eating a healthy diet.  Avoiding tobacco and drug use.  Limiting alcohol use.  Practicing safe sex.  Taking low doses of aspirin every day.  Taking vitamin and mineral supplements as recommended by your health care provider. What happens during an annual well check? The services and screenings done by your health care provider during your annual well check will depend on your age, overall health, lifestyle risk factors, and family history of disease. Counseling  Your health care provider may ask you questions about your:  Alcohol use.  Tobacco use.  Drug use.  Emotional well-being.  Home and relationship well-being.  Sexual activity.  Eating habits.  History of falls.  Memory  and ability to understand (cognition).  Work and work Astronomer. Screening  You may have the following tests or measurements:  Height, weight, and BMI.  Blood pressure.  Lipid and cholesterol levels. These may be checked every 5 years, or more frequently if you are over 72 years old.  Skin check.  Lung cancer screening. You may have this screening every year starting at age 62 if you have a 30-pack-year history of smoking and currently smoke or have quit within the past 15 years.  Fecal occult blood test (FOBT) of the stool. You may have this test every year starting at age 4.  Flexible sigmoidoscopy or colonoscopy. You may have a sigmoidoscopy every 5 years or a colonoscopy every 10 years starting at age 78.  Prostate cancer screening. Recommendations will vary depending on your family history and other risks.  Hepatitis C blood test.  Hepatitis B blood test.  Sexually transmitted disease (STD) testing.  Diabetes screening. This is done by checking your blood sugar (glucose) after you have not eaten for a while (fasting). You may have this done every 1-3 years.  Abdominal aortic aneurysm (AAA) screening. You may need this if you are a current or former smoker.  Osteoporosis. You may be screened starting at age 52 if you are at high risk. Talk with your health care provider about your test results, treatment options, and if necessary, the need for more tests. Vaccines  Your health care provider may recommend certain vaccines, such as:  Influenza vaccine. This is recommended every year.  Tetanus, diphtheria, and acellular pertussis (Tdap, Td) vaccine. You may need a Td booster every 10 years.  Zoster vaccine. You  may need this after age 98.  Pneumococcal 13-valent conjugate (PCV13) vaccine. One dose is recommended after age 18.  Pneumococcal polysaccharide (PPSV23) vaccine. One dose is recommended after age 63. Talk to your health care provider about which screenings  and vaccines you need and how often you need them. This information is not intended to replace advice given to you by your health care provider. Make sure you discuss any questions you have with your health care provider. Document Released: 07/04/2015 Document Revised: 02/25/2016 Document Reviewed: 04/08/2015 Elsevier Interactive Patient Education  2017 [place] Prevention in the Home Falls can cause injuries. They can happen to people of all ages. There are many things you can do to make your home safe and to help prevent falls. What can I do on the outside of my home?  Regularly fix the edges of walkways and driveways and fix any cracks.  Remove anything that might make you trip as you walk through a door, such as a raised step or threshold.  Trim any bushes or trees on the path to your home.  Use bright outdoor lighting.  Clear any walking paths of anything that might make someone trip, such as rocks or tools.  Regularly check to see if handrails are loose or broken. Make sure that both sides of any steps have handrails.  Any raised decks and porches should have guardrails on the edges.  Have any leaves, snow, or ice cleared regularly.  Use sand or salt on walking paths during winter.  Clean up any spills in your garage right away. This includes oil or grease spills. What can I do in the bathroom?  Use night lights.  Install grab bars by the toilet and in the tub and shower. Do not use towel bars as grab bars.  Use non-skid mats or decals in the tub or shower.  If you need to sit down in the shower, use a plastic, non-slip stool.  Keep the floor dry. Clean up any water that spills on the floor as soon as it happens.  Remove soap buildup in the tub or shower regularly.  Attach bath mats securely with double-sided non-slip rug tape.  Do not have throw rugs and other things on the floor that can make you trip. What can I do in the bedroom?  Use night  lights.  Make sure that you have a light by your bed that is easy to reach.  Do not use any sheets or blankets that are too big for your bed. They should not hang down onto the floor.  Have a firm chair that has side arms. You can use this for support while you get dressed.  Do not have throw rugs and other things on the floor that can make you trip. What can I do in the kitchen?  Clean up any spills right away.  Avoid walking on wet floors.  Keep items that you use a lot in easy-to-reach places.  If you need to reach something above you, use a strong step stool that has a grab bar.  Keep electrical cords out of the way.  Do not use floor polish or wax that makes floors slippery. If you must use wax, use non-skid floor wax.  Do not have throw rugs and other things on the floor that can make you trip. What can I do with my stairs?  Do not leave any items on the stairs.  Make sure that there are handrails on both  sides of the stairs and use them. Fix handrails that are broken or loose. Make sure that handrails are as long as the stairways.  Check any carpeting to make sure that it is firmly attached to the stairs. Fix any carpet that is loose or worn.  Avoid having throw rugs at the top or bottom of the stairs. If you do have throw rugs, attach them to the floor with carpet tape.  Make sure that you have a light switch at the top of the stairs and the bottom of the stairs. If you do not have them, ask someone to add them for you. What else can I do to help prevent falls?  Wear shoes that:  Do not have high heels.  Have rubber bottoms.  Are comfortable and fit you well.  Are closed at the toe. Do not wear sandals.  If you use a stepladder:  Make sure that it is fully opened. Do not climb a closed stepladder.  Make sure that both sides of the stepladder are locked into place.  Ask someone to hold it for you, if possible.  Clearly mark and make sure that you can  see:  Any grab bars or handrails.  First and last steps.  Where the edge of each step is.  Use tools that help you move around (mobility aids) if they are needed. These include:  Canes.  Walkers.  Scooters.  Crutches.  Turn on the lights when you go into a dark area. Replace any light bulbs as soon as they burn out.  Set up your furniture so you have a clear path. Avoid moving your furniture around.  If any of your floors are uneven, fix them.  If there are any pets around you, be aware of where they are.  Review your medicines with your doctor. Some medicines can make you feel dizzy. This can increase your chance of falling. Ask your doctor what other things that you can do to help prevent falls. This information is not intended to replace advice given to you by your health care provider. Make sure you discuss any questions you have with your health care provider. Document Released: 04/03/2009 Document Revised: 11/13/2015 Document Reviewed: 07/12/2014 Elsevier Interactive Patient Education  2017 [employer].

## 2017-03-17 NOTE — Progress Notes (Signed)
Subjective:   [name] [name] is a 71 y.o. male who presents for an Initial Medicare Annual Wellness Visit    Objective:    Today's Vitals   03/17/17 1151  BP: (!) 148/70  Pulse: 79  Temp: (!) 97.5 F (36.4 C)  TempSrc: Oral  SpO2: 93%  Weight: 187 lb (84.8 kg)  Height:  (1.753 m)   Body mass index is 27.62 kg/m.  Current Medications (verified) Outpatient Encounter Prescriptions as of 03/17/2017  Medication Sig  . acetaminophen (TYLENOL) 325 MG tablet Take 650 mg by mouth every 6 (six) hours as needed for moderate pain.   Marland Kitchen betamethasone dipropionate (DIPROLENE) 0.05 % cream Apply to legs and hands. Do not apply to face groin, axilla. Try Cerave first twice a day.  . carboxymethylcellulose (REFRESH PLUS) 0.5 % SOLN Place 1 drop into both eyes 2 (two) times daily.  . Emollient (CERAVE) CREA Apply 1 application topically daily as needed for itching once a day  . Eyelid Cleansers (SYSTANE LID WIPES EX) Apply 1 application topically 2 (two) times daily. Start occusoft lid scrubs: clean eyelids of oil and debris BID OU ( each eye ) before administering eye drops.   Marland Kitchen ipratropium (ATROVENT) 0.02 % nebulizer solution Take 0.5 mg by nebulization every 6 (six) hours.  Marland Kitchen labetalol (NORMODYNE) 200 MG tablet Take 200 mg by mouth daily. For HTN  . lisinopril (PRINIVIL,ZESTRIL) 20 MG tablet Take 20 mg by mouth daily.  Marland Kitchen loratadine (CLARITIN) 10 MG tablet Take 10 mg by mouth daily.  . polyethylene glycol (MIRALAX / GLYCOLAX) packet Take 17 g by mouth at bedtime.  . potassium chloride (K-DUR,KLOR-CON) 10 MEQ tablet Take 40 mEq by mouth daily.   . sennosides-docusate sodium (SENOKOT-S) 8.6-50 MG tablet Take 1 tablet by mouth at bedtime. For constipation  . tiotropium (SPIRIVA HANDIHALER) 18 MCG inhalation capsule Place 18 mcg into inhaler and inhale daily.  Marland Kitchen warfarin (COUMADIN) 1 MG tablet Take 0.5 mg by mouth one time only at 6 PM. On 03/11/2017  . warfarin (COUMADIN) 3 MG tablet Take 3  mg by mouth daily.  Cliffton Asters Petrolatum-Mineral Oil (SYSTANE NIGHTTIME) OINT Place 1 application into the left eye at bedtime. Apply to left eye at night    No facility-administered encounter medications on file as of 03/17/2017.     Allergies (verified) Penicillins   History: Past Medical History:  Diagnosis Date  . Dysphasia   . Fatty liver   . Hemiplegia (HCC)   . Hypertension   . Pneumonia   . Pulmonary embolism (HCC)   . 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(HCC)   . Stroke [facility])    Past Surgical History:  Procedure Laterality Date  . FLEXIBLE SIGMOIDOSCOPY N/A 10/21/2016   Procedure: FLEXIBLE SIGMOIDOSCOPY;  Surgeon: Malissa Hippo, MD;  [facility];  Service: Endoscopy;  Laterality: N/A;  . FLEXIBLE SIGMOIDOSCOPY N/A 10/25/2016   Procedure: FLEXIBLE SIGMOIDOSCOPY;  Surgeon: Malissa Hippo, MD;  Location: [facility];  Service: Endoscopy;  Laterality: N/A;  . unable     History reviewed. No pertinent family history. Social History   Occupational History  . Not on file.   Social History Main Topics  . Smoking status: Never Smoker  . Smokeless tobacco: Never Used  . Alcohol use No  . Drug use: No  . Sexual activity: Not on file   Tobacco Counseling Counseling given: Not Answered   Activities of Daily Living In your present state of health, do you have  any difficulty performing the following activities: 03/17/2017 11/03/2016  Hearing? N -  Vision? Y -  Difficulty concentrating or making decisions? N -  Walking or climbing stairs? Y -  Dressing or bathing? Y -  Doing errands, shopping? Y N  Preparing Food and eating ? Y -  Using the Toilet? Y -  In the past six months, have you accidently leaked urine? Y -  Do you have problems with loss of bowel control? Y -  Managing your Medications? Y -  Managing your Finances? Y -  Housekeeping or managing your Housekeeping? Y -  Some recent data might be hidden    Immunizations and Health Maintenance Immunization History   Administered Date(s) Administered  . Influenza-Unspecified 03/28/2014, 03/23/2016  . Pneumococcal-Unspecified 11/01/2009, 03/30/2016   There are no preventive care reminders to display for this patient.  [name], [name]  Indicate any recent Medical Services you may have received from other than [facility] providers in the past year (date may be approximate).    Assessment:   This is a routine wellness examination for [name] [name].   Hearing/Vision screen No exam data present  Dietary issues and exercise activities discussed: Current Exercise Habits: The patient does not participate in regular exercise at present, Exercise limited by: orthopedic condition(s)  Goals    None     Depression Screen PHQ 2/9 Scores 03/17/2017  PHQ - 2 Score 0    Fall Risk Fall Risk  03/17/2017  Falls in the past year? No    Cognitive Function:     6CIT Screen 03/17/2017  What Year? 4 points  What month? 0 points  What time? 0 points  Count back from 20 0 points  Months in reverse 4 points  Repeat phrase 6 points  Total Score 14    Screening Tests Health Maintenance  Topic Date Due  . INFLUENZA VACCINE  05/21/2017 (Originally 01/19/2017)  . COLONOSCOPY  10/30/2025 (Originally 11/08/1995)  . TETANUS/TDAP  10/30/2025 (Originally 11/07/1964)  . PNA vac Low Risk Adult (2 of 2 - PCV13) 03/30/2017  . Hepatitis C Screening  Completed        Plan:    I have personally reviewed and addressed the Medicare Annual Wellness questionnaire and have noted the following in the patient's chart:  A. Medical and social history B. Use of alcohol, tobacco or illicit drugs  C. Current medications and supplements D. Functional ability and status E.  Nutritional status F.  Physical activity G. Advance directives H. List of other physicians I.  Hospitalizations, surgeries, and ER visits in previous 12 months J.  Vitals K. Screenings to include hearing,  vision, cognitive, depression L. Referrals and appointments - none  In addition, I have reviewed and discussed with patient certain preventive protocols, quality metrics, and best practice recommendations. A written personalized care plan for preventive services as well as general preventive health recommendations were provided to patient.  See attached scanned questionnaire for additional information.   Signed,   Annetta Maw, RN Nurse Health Advisor   Quick Notes   Health Maintenance: Flu vaccine, TDAP due     Abnormal Screen:6 CIT-14     Patient Concerns: None     Nurse Concerns: None

## 2017-03-21 ENCOUNTER — Encounter (HOSPITAL_COMMUNITY)
Admission: RE | Admit: 2017-03-21 | Discharge: 2017-03-21 | Disposition: A | Discharge status: Home / Self Care | Payer: Medicare Other | Source: Skilled Nursing Facility | Attending: Internal Medicine | Admitting: Internal Medicine

## 2017-03-21 DIAGNOSIS — I10 Essential (primary) hypertension: Secondary | ICD-10-CM | POA: Insufficient documentation

## 2017-03-21 DIAGNOSIS — Z7901 Long term (current) use of anticoagulants: Secondary | ICD-10-CM | POA: Insufficient documentation

## 2017-03-21 DIAGNOSIS — I69998 Other sequelae following unspecified cerebrovascular disease: Secondary | ICD-10-CM | POA: Diagnosis not present

## 2017-03-21 DIAGNOSIS — G8191 Hemiplegia, unspecified affecting right dominant side: Secondary | ICD-10-CM | POA: Diagnosis not present

## 2017-03-21 DIAGNOSIS — R293 Abnormal posture: Secondary | ICD-10-CM | POA: Diagnosis not present

## 2017-03-21 LAB — PROTIME-INR
INR: 3.17
PROTHROMBIN TIME: 32.3 s — AB (ref 11.4–15.2)

## 2017-03-23 DIAGNOSIS — I639 Cerebral infarction, unspecified: Secondary | ICD-10-CM | POA: Diagnosis not present

## 2017-03-23 DIAGNOSIS — R293 Abnormal posture: Secondary | ICD-10-CM | POA: Diagnosis not present

## 2017-03-23 DIAGNOSIS — M6281 Muscle weakness (generalized): Secondary | ICD-10-CM | POA: Diagnosis not present

## 2017-03-23 DIAGNOSIS — I69328 Other speech and language deficits following cerebral infarction: Secondary | ICD-10-CM | POA: Diagnosis not present

## 2017-03-24 ENCOUNTER — Encounter (HOSPITAL_COMMUNITY)
Admission: RE | Admit: 2017-03-24 | Discharge: 2017-03-24 | Disposition: A | Discharge status: Home / Self Care | Payer: Medicare Other | Source: Skilled Nursing Facility | Attending: Internal Medicine | Admitting: Internal Medicine

## 2017-03-24 DIAGNOSIS — I69328 Other speech and language deficits following cerebral infarction: Secondary | ICD-10-CM | POA: Insufficient documentation

## 2017-03-24 DIAGNOSIS — K562 Volvulus: Secondary | ICD-10-CM | POA: Insufficient documentation

## 2017-03-24 DIAGNOSIS — I10 Essential (primary) hypertension: Secondary | ICD-10-CM | POA: Diagnosis not present

## 2017-03-24 DIAGNOSIS — M6281 Muscle weakness (generalized): Secondary | ICD-10-CM | POA: Insufficient documentation

## 2017-03-24 DIAGNOSIS — R1312 Dysphagia, oropharyngeal phase: Secondary | ICD-10-CM | POA: Diagnosis not present

## 2017-03-24 DIAGNOSIS — R293 Abnormal posture: Secondary | ICD-10-CM | POA: Insufficient documentation

## 2017-03-24 DIAGNOSIS — Z7901 Long term (current) use of anticoagulants: Secondary | ICD-10-CM | POA: Insufficient documentation

## 2017-03-24 LAB — PROTIME-INR
INR: 2.25
PROTHROMBIN TIME: 24.7 s — AB (ref 11.4–15.2)

## 2017-03-31 ENCOUNTER — Encounter (HOSPITAL_COMMUNITY)
Admission: RE | Admit: 2017-03-31 | Discharge: 2017-03-31 | Disposition: A | Discharge status: Home / Self Care | Payer: Medicare Other | Source: Skilled Nursing Facility | Attending: *Deleted | Admitting: *Deleted

## 2017-03-31 DIAGNOSIS — I69998 Other sequelae following unspecified cerebrovascular disease: Secondary | ICD-10-CM | POA: Diagnosis not present

## 2017-03-31 DIAGNOSIS — I10 Essential (primary) hypertension: Secondary | ICD-10-CM | POA: Diagnosis not present

## 2017-03-31 DIAGNOSIS — R293 Abnormal posture: Secondary | ICD-10-CM | POA: Diagnosis not present

## 2017-03-31 DIAGNOSIS — G8191 Hemiplegia, unspecified affecting right dominant side: Secondary | ICD-10-CM | POA: Diagnosis not present

## 2017-03-31 DIAGNOSIS — Z7901 Long term (current) use of anticoagulants: Secondary | ICD-10-CM | POA: Diagnosis not present

## 2017-03-31 LAB — PROTIME-INR
INR: 2.15
PROTHROMBIN TIME: 23.8 s — AB (ref 11.4–15.2)

## 2017-04-05 ENCOUNTER — Encounter: Payer: Self-pay | Admitting: Internal Medicine

## 2017-04-05 ENCOUNTER — Non-Acute Institutional Stay (SKILLED_NURSING_FACILITY): Payer: Medicare Other | Admitting: Internal Medicine

## 2017-04-05 ENCOUNTER — Ambulatory Visit (INDEPENDENT_AMBULATORY_CARE_PROVIDER_SITE_OTHER): Payer: Medicare Other | Admitting: "Endocrinology

## 2017-04-05 VITALS — BP 131/85 | HR 105

## 2017-04-05 DIAGNOSIS — E0590 Thyrotoxicosis, unspecified without thyrotoxic crisis or storm: Secondary | ICD-10-CM

## 2017-04-05 DIAGNOSIS — I10 Essential (primary) hypertension: Secondary | ICD-10-CM | POA: Diagnosis not present

## 2017-04-05 DIAGNOSIS — H1031 Unspecified acute conjunctivitis, right eye: Secondary | ICD-10-CM | POA: Diagnosis not present

## 2017-04-05 DIAGNOSIS — I6389 Other cerebral infarction: Secondary | ICD-10-CM

## 2017-04-05 NOTE — Progress Notes (Signed)
Subjective:    Patient ID:  [name], male    DOB: April 26, 1946, PCP Mahlon Gammon, MD   Past Medical History:  Diagnosis Date  . Dysphasia   . Fatty liver   . Hemiplegia (HCC)   . Hypertension   . Pneumonia   . Pulmonary embolism (HCC)   . 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(HCC)   . Stroke [facility])    Past Surgical History:  Procedure Laterality Date  . FLEXIBLE SIGMOIDOSCOPY N/A 10/21/2016   Procedure: FLEXIBLE SIGMOIDOSCOPY;  Surgeon: Malissa Hippo, MD;  [facility];  Service: Endoscopy;  Laterality: N/A;  . FLEXIBLE SIGMOIDOSCOPY N/A 10/25/2016   Procedure: FLEXIBLE SIGMOIDOSCOPY;  Surgeon: Malissa Hippo, MD;  [facility];  Service: Endoscopy;  Laterality: N/A;  . unable     Social History   Social History  . Marital status: Single    Spouse name: N/A  . Number of children: N/A  . Years of education: N/A   Social History Main Topics  . Smoking status: Never Smoker  . Smokeless tobacco: Never Used  . Alcohol use No  . Drug use: No  . Sexual activity: Not on file   Other Topics Concern  . Not on file   Social History Narrative  . No narrative on file   Outpatient Encounter Prescriptions as of 04/05/2017  Medication Sig  . acetaminophen (TYLENOL) 325 MG tablet Take 650 mg by mouth every 6 (six) hours as needed for moderate pain.   Marland Kitchen betamethasone dipropionate (DIPROLENE) 0.05 % cream Apply to legs and hands. Do not apply to face groin, axilla. Try Cerave first twice a day.  . carboxymethylcellulose (REFRESH PLUS) 0.5 % SOLN Place 1 drop into both eyes 2 (two) times daily.  . Emollient (CERAVE) CREA Apply 1 application topically daily as needed for itching once a day  . Eyelid Cleansers (SYSTANE LID WIPES EX) Apply 1 application topically 2 (two) times daily. Start occusoft lid scrubs: clean eyelids of oil and debris BID OU ( each eye ) before administering eye drops.   Marland Kitchen ipratropium (ATROVENT) 0.02 % nebulizer solution Take 0.5 mg by  nebulization every 6 (six) hours.  Marland Kitchen labetalol (NORMODYNE) 200 MG tablet Take 200 mg by mouth daily. For HTN  . lisinopril (PRINIVIL,ZESTRIL) 20 MG tablet Take 20 mg by mouth daily.  Marland Kitchen loratadine (CLARITIN) 10 MG tablet Take 10 mg by mouth daily.  . polyethylene glycol (MIRALAX / GLYCOLAX) packet Take 17 g by mouth at bedtime.  . potassium chloride (K-DUR,KLOR-CON) 10 MEQ tablet Take 40 mEq by mouth daily.   . sennosides-docusate sodium (SENOKOT-S) 8.6-50 MG tablet Take 1 tablet by mouth at bedtime. For constipation  . tiotropium (SPIRIVA HANDIHALER) 18 MCG inhalation capsule Place 18 mcg into inhaler and inhale daily.  Marland Kitchen warfarin (COUMADIN) 1 MG tablet Take 0.5 mg by mouth one time only at 6 PM. On 03/11/2017  . warfarin (COUMADIN) 3 MG tablet Take 3 mg by mouth daily.  Cliffton Asters Petrolatum-Mineral Oil (SYSTANE NIGHTTIME) OINT Place 1 application into the left eye at bedtime. Apply to left eye at night    No facility-administered encounter medications on file as of 04/05/2017.    ALLERGIES: Allergies  Allergen Reactions  . Penicillins     Has patient had a PCN reaction causing immediate rash, facial/tongue/throat swelling, SOB or lightheadedness with hypotension: unknown Has patient had a PCN reaction causing severe rash involving mucus membranes or skin necrosis: unknown Has patient had a  PCN reaction that required hospitalization: unknown Has patient had a PCN reaction occurring within the last 10 years: unknown If all of the above answers are "NO", then may proceed with Cephalosporin use. 5/3 Tolerated Cefepime x 1 dose in ED.     VACCINATION STATUS: Immunization History  Administered Date(s) Administered  . Influenza-Unspecified 03/28/2014, 03/23/2016, 03/25/2017  . Pneumococcal Conjugate-13 04/21/2015  . Pneumococcal-Unspecified 11/01/2009, 03/30/2016  . Tdap 03/17/2017    HPI Jospeh SULO JANCZAK is 71 y.o. male who presents today with a medical history as above. He has multiple  medical problems including stroke with right-sided hemiplegia, nursing home resident. - He also has dysphagia, was found to be losing weight. Routine lab work showed suppressed TSH of <0.010 in May 2018. On subsequent repeat labs from 03/09/2017 TSH remained suppressed at less than 0.01, total T3 high normal at 163 and total T4 was high normal at 11.3. he is being seen in consultation for hyperthyroidism requested by Mahlon Gammon, MD.   He is unable to provide history, accompanied by his brother not aware of the details of his medical problems. he has been dealing with symptoms of  loss of weight, sleep disturbance. 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   Review of Systems  Constitutional: + weight loss ( stable since July), + fatigue, no subjective hyperthermia, no subjective hypothermia Eyes: no blurry vision, no xerophthalmia ENT: no sore throat, no nodules palpated in throat, no dysphagia/odynophagia, no hoarseness Cardiovascular: no Chest Pain, no Shortness of Breath, no palpitations, no leg swelling Respiratory: no cough, no SOB Gastrointestinal: no Nausea/Vomiting/Diarhhea Musculoskeletal: + Wheelchair-bound  Skin: no rashes Neurological: no tremors, no numbness, no tingling, no dizziness Psychiatric: no depression, no anxiety  Objective:    BP 131/85   Pulse (!) 105   Wt Readings from Last 3 Encounters:  03/17/17 187 lb (84.8 kg)  02/07/17 186 lb 12.8 oz (84.7 kg)  12/20/16 188 lb (85.3 kg)    Physical Exam  Constitutional: + Appropriate weight for height, not in acute distress, + + wheelchair-bound, with stiff and spastic right upper and lower extremities Eyes: PERRLA, EOMI, no exophthalmos, + drooling ENT: moist mucous membranes, no thyromegaly, no cervical lymphadenopathy Cardiovascular: normal precordial activity, + tachycardic at 105, no  Murmur/Rubs/Gallops Respiratory:  adequate breathing efforts, no gross chest deformity, Clear to auscultation bilaterally Gastrointestinal: abdomen soft, Non -tender, No distension, Bowel Sounds present Musculoskeletal: + + wheelchair-bound, with stiff and spastic right upper and lower extremities Skin: moist, warm, no rashes Neurological: no tremor with outstretched  Left hand, Deep tendon reflexes normal in all four extremities.  CMP ( most recent) CMP     Component Value Date/Time   NA 139 03/09/2017 0711   NA 142 09/12/2015   K 4.0 03/09/2017 0711   CL 105 03/09/2017 0711   CO2 26 03/09/2017 0711   GLUCOSE 84 03/09/2017 0711   BUN 24 (H) 03/09/2017 0711   BUN 18 09/12/2015   CREATININE 0.99 03/09/2017 0711   CALCIUM 8.7 (L) 03/09/2017 0711   PROT 7.0 12/21/2016 0500   ALBUMIN 3.2 (L) 12/21/2016 0500   AST 23 12/21/2016 0500   ALT 29 12/21/2016 0500   ALKPHOS 58 12/21/2016 0500   BILITOT 0.4 12/21/2016 0500   GFRNONAA >60 03/09/2017 0711   GFRAA >60 03/09/2017 0711     Diabetic Labs (most recent): Lab Results  Component Value Date  HGBA1C  03/27/2009    6.0 (NOTE) The ADA recommends the following therapeutic goal for glycemic control related to Hgb A1c measurement: Goal of therapy: <6.5 Hgb A1c  Reference: American Diabetes Association: Clinical Practice Recommendations 2010, Diabetes Care, 2010, 33: (Suppl  1).     Lipid Panel ( most recent) Lipid Panel     Component Value Date/Time   CHOL 106 03/09/2017 0712   TRIG 42 03/09/2017 0712   HDL 31 (L) 03/09/2017 0712   CHOLHDL 3.4 03/09/2017 0712   VLDL 8 03/09/2017 0712   LDLCALC 67 03/09/2017 0712     Results for GEORGIOS, KINA (MRN [id number]) as of 04/05/2017 09:30  Ref. Range 12/19/2015 07:35 10/27/2016 05:27 03/09/2017 07:11 03/09/2017 07:12  TSH Latest Ref Range: 0.350 - 4.500 uIU/mL 1.285 <0.010 (L)  <0.010 (L)  Triiodothyronine (T3) Latest Ref Range: 71 - 180 ng/dL   782   Thyroxine (T4) Latest Ref  Range: 4.5 - 12.0 ug/dL   95.6     Assessment & Plan:   1. Subclinical hyperthyroidism  - KAEDAN RICHERT  is being seen at a kind request of Mahlon Gammon, MD. - I have reviewed his available thyroid records and clinically evaluated the patient. - Based on my reviews, he has subclinical hyperthyroidism, with some subtle symptoms including tachycardia, not significant weight loss.  - Given his multiple medical problems including stroke, he may benefit from treatment if he has hyperthyroidism. - He will need confirmatory thyroid uptake and scan to help with  decision-making.  - He will return in 10 days for reevaluation.   - I did not initiate any new prescriptions today.  - I advised patient to maintain close follow up with Mahlon Gammon, MD for primary care needs. Follow up plan: Return in about 10 days (around 04/15/2017) for follow up with thyroid uptake and scan.  Marquis Lunch, MD [facility] Phone: [phone]  Fax: [phone]   04/05/2017, 9:40 AM This note was partially dictated with voice recognition software. Similar sounding words can be transcribed inadequately or may not  be corrected upon review.

## 2017-04-05 NOTE — Progress Notes (Signed)
Location:   [facility] Room Number: 112/W Place of Service:  SNF [phone]) Provider:  Sabino Dick, MD  [name], [name]  Extended Emergency Contact Information Primary Emergency Contact: [name] Address: [address]          [place], [place] [address] [place] Home Phone: [phone] Mobile Phone: [phone] Relation: Brother  Code Status:  Full Code Goals of care: Advanced Directive information Advanced Directives 04/05/2017  Does Patient Have a Medical Advance Directive? Yes  Type of Advance Directive (No Data)  Does patient want to make changes to medical advance directive? No - Patient declined  Copy of Healthcare Power of Attorney in Chart? No - copy requested  Would patient like information on creating a medical advance directive? No - Patient declined     Chief complaint-acute visit secondary to suspected conjunctivitis-  HPI:  Pt is a 71 y.o. 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  These conditions appear to be stable he also has an opacity of his left eye-today his right eye has some increased erythema is not really complaining of any pain nursing staff has not really noted any exudate but there is some clear drainage.  He denies any visual changes with the eye.  He does have a history of COPD--is on Spiriva-he was seen last month for possible increased shortness of breath but chest x-ray was negative and he appears to have been stable the past few weeks he does have somewhat of a chronic cough but is not complaining of any increased shortness breath or cough  beyond baseline he does have somewhat chronic congested breath sounds which have not really change per nursing  Vital signs are stable he is afebrile O2 sats ration is 93% on room air Past Medical History:  Diagnosis Date  . Dysphasia   . Fatty liver   . Hemiplegia (HCC)   . Hypertension   . Pneumonia   . Pulmonary embolism (HCC)   . 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(HCC)   . Stroke [facility])    Past Surgical History:  Procedure Laterality Date  . FLEXIBLE SIGMOIDOSCOPY N/A 10/21/2016   Procedure: FLEXIBLE SIGMOIDOSCOPY;  Surgeon: Malissa Hippo, MD;  [facility];  Service: Endoscopy;  Laterality: N/A;  . FLEXIBLE SIGMOIDOSCOPY N/A 10/25/2016   Procedure: FLEXIBLE SIGMOIDOSCOPY;  Surgeon: Malissa Hippo, MD;  Location: [facility];  Service: Endoscopy;  Laterality: N/A;  . unable      Allergies  Allergen Reactions  . Penicillins     Has patient had a PCN reaction causing immediate rash, facial/tongue/throat swelling, SOB or lightheadedness with hypotension: unknown Has patient had a PCN reaction causing severe rash involving mucus membranes or skin necrosis: unknown Has patient had a PCN reaction that required hospitalization: unknown Has patient had a PCN reaction occurring within the last 10 years: unknown If all of the above answers are "NO", then may proceed with Cephalosporin use. 5/3 Tolerated Cefepime x 1 dose in ED.     Outpatient Encounter Prescriptions as of 04/05/2017  Medication Sig  . acetaminophen (TYLENOL) 325 MG tablet Take 650 mg by mouth every  6 (six) hours as needed for moderate pain.   Marland Kitchen betamethasone dipropionate (DIPROLENE) 0.05 % cream Apply to legs and hands. Do not apply to face groin, axilla. Try Cerave first twice a day.  . carboxymethylcellulose (REFRESH PLUS) 0.5 % SOLN Place 1 drop into both eyes 2 (two) times daily.  . Emollient (CERAVE) CREA Apply 1 application topically daily as needed for itching once a day  . Eyelid Cleansers (SYSTANE LID WIPES  EX) Apply 1 application topically 2 (two) times daily. Start occusoft lid scrubs: clean eyelids of oil and debris BID OU ( each eye ) before administering eye drops.   Marland Kitchen ipratropium (ATROVENT) 0.02 % nebulizer solution Take 0.5 mg by nebulization every 6 (six) hours.  Marland Kitchen labetalol (NORMODYNE) 200 MG tablet Take 200 mg by mouth daily. For HTN  . lisinopril (PRINIVIL,ZESTRIL) 20 MG tablet Take 20 mg by mouth daily.  Marland Kitchen loratadine (CLARITIN) 10 MG tablet Take 10 mg by mouth daily.  . polyethylene glycol (MIRALAX / GLYCOLAX) packet Take 17 g by mouth at bedtime.  . potassium chloride (K-DUR,KLOR-CON) 10 MEQ tablet Take 40 mEq by mouth daily.   . sennosides-docusate sodium (SENOKOT-S) 8.6-50 MG tablet Take 1 tablet by mouth at bedtime. For constipation  . tiotropium (SPIRIVA HANDIHALER) 18 MCG inhalation capsule Place 18 mcg into inhaler and inhale daily.  Marland Kitchen warfarin (COUMADIN) 3 MG tablet Take 3 mg by mouth daily.  Cliffton Asters Petrolatum-Mineral Oil (SYSTANE NIGHTTIME) OINT Place 1 application into the left eye at bedtime. Apply to left eye at night   . [DISCONTINUED] warfarin (COUMADIN) 1 MG tablet Take 0.5 mg by mouth one time only at 6 PM. On 03/11/2017   No facility-administered encounter medications on file as of 04/05/2017.     Review of Systems   In general is not complaining of fever or chills.  Skin does not complain of diaphoresis or itching.  Head ears eyes nose mouth and throat-does not complaining of any sore throat is noted to have an erythematous right eye does not complain of pain or visual changes  Respiratory does not complaining of any increased shortness breath has somewhat of a chronic cough which is not worsened area  Cardiac does not complain of chest pain has baseline lower extremity edema.  GI does not complaining of any abdominal pain nausea vomiting diarrhea constipation continues to have a good appetite  Musculoskeletal does not complain of joint pain does have a  history of  right-sided weakness with history of CVA.  Neurologic does not complain of any headache dizziness or syncope.  Psych continues to be in good spirits does not complain of overt anxiety or depression really  Immunization History  Administered Date(s) Administered  . Influenza-Unspecified 03/28/2014, 03/23/2016, 03/25/2017  . Pneumococcal Conjugate-13 04/21/2015  . Pneumococcal-Unspecified 11/01/2009, 03/30/2016  . Tdap 03/17/2017   Pertinent  Health Maintenance Due  Topic Date Due  . COLONOSCOPY  10/30/2025 (Originally 11/08/1995)  . INFLUENZA VACCINE  Completed  . PNA vac Low Risk Adult  Completed    Functional Status Survey:    Vitals:   04/05/17 1350  BP: 130/78  Pulse: 94  Resp: 18  Temp: 98.7 F (37.1 C)  TempSrc: Oral  SpO2: 93%    Physical Exam   In general this is a pleasant elderly male in no distress sitting comfortably in his wheelchair.  His skin is warm and dry.  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  Chest he does have some scattered rhonchi which appears to be baseline there is no labored breathing.  Heart is regular rate and rhythm without murmur gallop or rub he has baseline lower extremity edema.  Abdomen is obese soft nontender with active bowel sounds.  Musculoskeletal continues with right-sided weakness which is baseline with contracture of his right arm he does again have some movement of his right-sided extremities left-sided extremities appear to be strength intact.  Neurologic as noted above in right-sided weakness he does have some slurring of his speech which is baseline.  Psych he is alert and oriented pleasant and appropriate  Labs reviewed:  Recent Labs  10/29/16 0412  11/01/16 0700  11/04/16 0438   01/06/17 0715 01/24/17 1045 03/09/17 0711  NA 147*  < > 146*  < > 143  < > 139 140 139  K 2.6*  < > 2.8*  < > 2.9*  < > 4.4 4.9 4.0  CL 115*  < > 110  < > 107  < > 104 106 105  CO2 28  < > 29  < > 31  < > GLUCOSE 96  < > 82  < > 90  < > 86 89 84  BUN 9  < > 14  < > 12  < > 21* 25* 24*  CREATININE 0.89  < > 0.85  < > 0.86  < > 1.03 1.12 0.99  CALCIUM 7.9*  < > 8.1*  < > 7.9*  < > 8.8* 9.1 8.7*  MG 1.6*  --  1.8  --  1.6*  --   --   --   --   < > = values in this interval not displayed.  Recent Labs  10/21/16 0914 11/03/16 0455 12/21/16 0500  AST 38 26 23  ALT 45 42 29  ALKPHOS 59 51 58  BILITOT 0.7 0.4 0.4  PROT 8.2* 6.4* 7.0  ALBUMIN 3.5 2.9* 3.2*    Recent Labs  11/24/16 0718 12/21/16 0500 01/06/17 0715 03/09/17 0711  WBC 8.6 7.8  --  7.7  NEUTROABS 5.0 4.2  --  4.5  HGB 14.4 13.9  --  15.1  HCT 44.3 44.1 44.5 46.2  MCV 92.3 93.4  --  91.5  PLT 308 280  --  269   Lab Results  Component Value Date   TSH <0.010 (L) 03/09/2017   Lab Results  Component Value Date   HGBA1C  03/27/2009    6.0 (NOTE) The ADA recommends the following therapeutic goal for glycemic control related to Hgb A1c measurement: Goal of therapy: <6.5 Hgb A1c  Reference: American Diabetes Association: Clinical Practice Recommendations 2010, Diabetes Care, 2010, 33: (Suppl  1).   Lab Results  Component Value Date   CHOL 106 03/09/2017   HDL 31 (L) 03/09/2017   LDLCALC 67 03/09/2017   TRIG 42 03/09/2017   CHOLHDL 3.4 03/09/2017    Significant Diagnostic Results in last 30 days:  No results found.  Assessment/Plan  # #1 conjunctivitis-at this point will treat as allergic conjunctivitis and start Patanol eyedrops 1 drop twice a day for 7 days-monitor for resolution-will apply this to the right eye.  Will have to be monitored however for effectiveness clinically he is not having any distress here visual changes or acute changes otherwise.  #2-hypertension-blood pressure today  appears to be stable at 130/78 I previous systolic 140s-he is on labetalol as well as lisinopril-at one point consideration was to DC the lisinopril secondary to increased cough-will rediscuss this with Dr. Chales Abrahams.  Clinically blood pressures generally are stable per nursing in it is today-suspect cough is somewhat due to his chronic COPD but again we will address this with Dr. Chales Abrahams to see if there may be another alternative.  #3 history of subclinical hyperthyroidism patient has been noted to have a depressed TSH was less than 0.010 on lab done last month-he has been seen by endocrinology actually saw endocrinologist earlier today and thought to have subclinical hyperthyroidism and a thyroid scan with uptake is scheduled apparently for later this month.  Of note T3 on lab was 163 T4 was 11.3.  Clinically he does not appear to be overtly symptomatic and stable weight and vital signs but again will await endocrinology workup.  [id number] note greater than 25 minutes spent assessing patient reviewing his chart reviewing his labs as well as consult note from endocrinology and coordinating and formulating a plan of care-notew greater than 50% of time spent coordinating plan of care

## 2017-04-06 ENCOUNTER — Encounter (HOSPITAL_COMMUNITY)
Admission: RE | Admit: 2017-04-06 | Discharge: 2017-04-06 | Disposition: A | Discharge status: Home / Self Care | Payer: Medicare Other | Source: Skilled Nursing Facility | Attending: Internal Medicine | Admitting: Internal Medicine

## 2017-04-06 DIAGNOSIS — I69328 Other speech and language deficits following cerebral infarction: Secondary | ICD-10-CM | POA: Insufficient documentation

## 2017-04-06 DIAGNOSIS — R1312 Dysphagia, oropharyngeal phase: Secondary | ICD-10-CM | POA: Insufficient documentation

## 2017-04-06 DIAGNOSIS — R293 Abnormal posture: Secondary | ICD-10-CM | POA: Diagnosis not present

## 2017-04-06 DIAGNOSIS — I10 Essential (primary) hypertension: Secondary | ICD-10-CM | POA: Diagnosis not present

## 2017-04-06 DIAGNOSIS — M6281 Muscle weakness (generalized): Secondary | ICD-10-CM | POA: Diagnosis not present

## 2017-04-06 DIAGNOSIS — K562 Volvulus: Secondary | ICD-10-CM | POA: Diagnosis not present

## 2017-04-06 LAB — BASIC METABOLIC PANEL
Anion gap: 9 (ref 5–15)
BUN: 21 mg/dL — AB (ref 6–20)
CHLORIDE: 100 mmol/L — AB (ref 101–111)
CO2: 28 mmol/L (ref 22–32)
CREATININE: 1.03 mg/dL (ref 0.61–1.24)
Calcium: 8.8 mg/dL — ABNORMAL LOW (ref 8.9–10.3)
GFR calc Af Amer: >60 mL/min (ref >60)
GFR calc non Af Amer: >60 mL/min (ref >60)
Glucose, Bld: 93 mg/dL (ref 65–99)
Potassium: 4.1 mmol/L (ref 3.5–5.1)
Sodium: 137 mmol/L (ref 135–145)

## 2017-04-06 LAB — PROTIME-INR
INR: 2.36
Prothrombin Time: 25.6 seconds — ABNORMAL HIGH (ref 11.4–15.2)

## 2017-04-07 ENCOUNTER — Non-Acute Institutional Stay (SKILLED_NURSING_FACILITY): Payer: Medicare Other | Admitting: Internal Medicine

## 2017-04-07 ENCOUNTER — Encounter: Payer: Self-pay | Admitting: Internal Medicine

## 2017-04-07 ENCOUNTER — Encounter (HOSPITAL_COMMUNITY)
Admission: RE | Admit: 2017-04-07 | Discharge: 2017-04-07 | Disposition: A | Discharge status: Home / Self Care | Payer: Medicare Other | Source: Skilled Nursing Facility | Attending: Internal Medicine | Admitting: Internal Medicine

## 2017-04-07 DIAGNOSIS — R293 Abnormal posture: Secondary | ICD-10-CM | POA: Insufficient documentation

## 2017-04-07 DIAGNOSIS — K562 Volvulus: Secondary | ICD-10-CM

## 2017-04-07 DIAGNOSIS — M6281 Muscle weakness (generalized): Secondary | ICD-10-CM | POA: Insufficient documentation

## 2017-04-07 DIAGNOSIS — J410 Simple chronic bronchitis: Secondary | ICD-10-CM

## 2017-04-07 DIAGNOSIS — E876 Hypokalemia: Secondary | ICD-10-CM | POA: Diagnosis not present

## 2017-04-07 DIAGNOSIS — E0590 Thyrotoxicosis, unspecified without thyrotoxic crisis or storm: Secondary | ICD-10-CM

## 2017-04-07 DIAGNOSIS — I2782 Chronic pulmonary embolism: Secondary | ICD-10-CM

## 2017-04-07 DIAGNOSIS — I69328 Other speech and language deficits following cerebral infarction: Secondary | ICD-10-CM | POA: Insufficient documentation

## 2017-04-07 DIAGNOSIS — I10 Essential (primary) hypertension: Secondary | ICD-10-CM

## 2017-04-07 DIAGNOSIS — R1312 Dysphagia, oropharyngeal phase: Secondary | ICD-10-CM | POA: Insufficient documentation

## 2017-04-07 DIAGNOSIS — Z7901 Long term (current) use of anticoagulants: Secondary | ICD-10-CM | POA: Insufficient documentation

## 2017-04-07 NOTE — Progress Notes (Signed)
Location:   [facility] Room Number: 112/W Place of Service:  SNF [id number]([id number]) Provider:  Lenon CurtAnjali,Kelse Ploch  Ashlei Chinchilla, Freddie BreechAnjali L, MD  Patient Care Team: Mahlon GammonGupta, Beulah Matusek L, [name]  Extended Emergency Contact Information Primary Emergency Contact: Venetia MaxonWalker,[name] Jr Address: [address]          [place], [place] [address] [place] Home Phone: [phone] Mobile Phone: [phone] Relation: Brother  Code Status:  Full Code Goals of care: Advanced Directive information Advanced Directives 04/07/2017  Does Patient Have a Medical Advance Directive? Yes  Type of Advance Directive (No Data)  Does patient want to make changes to medical advance directive? No - Patient declined  Copy of Healthcare Power of Attorney in Chart? No - copy requested  Would patient like information on creating a medical advance directive? No - Patient declined     Chief Complaint  Patient presents with  . Medical Management of Chronic Issues    Routine Visit    HPI:  Pt is a 71 y.o. male seen today for medical management of chronic diseases.    He Has h/o COPD, CVA with Right Sided Hemiparesis, h/o PE and DVT on chronic Coumadin therapy, Hypertension, Recurrent Sigmoid Volvulus, And recent diagnosis of Subclinical Hyperthyroidism.  Patient Has been doing well in Facility. He did have Acute Conjunctivitis and is on Patanol. But continues to have redness and discharge from the Eye.  He also Continues to have cough with Productive sputum but no SOB. No Fever or Chills. No Chest pain. He was recently seen by Dr Fransico HimNida for Subclinical Hyperthyroidism and plan is for him to ger Thyroid Uptake scan. He continues to be stable from Volvulus. Moving his Bowels well and no abdominal pain. His weigh is 182 lbs which is less from 186 lbs from last visit.    Past Medical History:  Diagnosis Date  . Dysphasia   . Fatty liver   . Hemiplegia (HCC)   . Hypertension     . Pneumonia   . Pulmonary embolism (HCC)   . 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(HCC)   . Stroke [facility](HCC)    Past Surgical History:  Procedure Laterality Date  . FLEXIBLE SIGMOIDOSCOPY N/A 10/21/2016   Procedure: FLEXIBLE SIGMOIDOSCOPY;  Surgeon: Malissa Hippoehman, Najeeb U, MD;  [facility];  Service: Endoscopy;  Laterality: N/A;  . FLEXIBLE SIGMOIDOSCOPY N/A 10/25/2016   Procedure: FLEXIBLE SIGMOIDOSCOPY;  Surgeon: Malissa Hippoehman, Najeeb U, MD;  Location: [facility];  Service: Endoscopy;  Laterality: N/A;  . unable      Allergies  Allergen Reactions  . Penicillins     Has patient had a PCN reaction causing immediate rash, facial/tongue/throat swelling, SOB or lightheadedness with hypotension: unknown Has patient had a PCN reaction causing severe rash involving mucus membranes or skin necrosis: unknown Has patient had a PCN reaction that required hospitalization: unknown Has patient had a PCN reaction occurring within the last 10 years: unknown If all of the above answers are "NO", then may proceed with Cephalosporin use. 5/3 Tolerated Cefepime x 1 dose in ED.     Outpatient Encounter Prescriptions as of 04/07/2017  Medication Sig  . acetaminophen (TYLENOL) 325 MG tablet Take 650 mg by mouth every 6 (six) hours as needed for moderate pain.   Marland Kitchen. betamethasone dipropionate (DIPROLENE) 0.05 % cream Apply to legs and hands. Do not apply to face groin, axilla. Try Cerave first twice a day.  . carboxymethylcellulose (REFRESH PLUS) 0.5 % SOLN Place  1 drop into both eyes 2 (two) times daily.  . Emollient (CERAVE) CREA Apply 1 application topically daily as needed for itching once a day  . Eyelid Cleansers (SYSTANE LID WIPES EX) Apply 1 application topically 2 (two) times daily. Start occusoft lid scrubs: clean eyelids of oil and debris BID OU ( each eye ) before administering eye drops.   Marland Kitchen ipratropium (ATROVENT) 0.02 % nebulizer solution Take 0.5 mg by nebulization every 6 (six) hours.  Marland Kitchen labetalol (NORMODYNE) 200  MG tablet Take 200 mg by mouth daily. For HTN  . lisinopril (PRINIVIL,ZESTRIL) 20 MG tablet Take 20 mg by mouth daily.  Marland Kitchen loratadine (CLARITIN) 10 MG tablet Take 10 mg by mouth daily.  Marland Kitchen olopatadine (PATANOL) 0.1 % ophthalmic solution Place 1 drop into both eyes 2 (two) times daily.  . polyethylene glycol (MIRALAX / GLYCOLAX) packet Take 17 g by mouth at bedtime.  . potassium chloride (K-DUR,KLOR-CON) 10 MEQ tablet Take 40 mEq by mouth daily.   . sennosides-docusate sodium (SENOKOT-S) 8.6-50 MG tablet Take 1 tablet by mouth at bedtime. For constipation  . tiotropium (SPIRIVA HANDIHALER) 18 MCG inhalation capsule Place 18 mcg into inhaler and inhale daily.  Marland Kitchen warfarin (COUMADIN) 3 MG tablet Take 3 mg by mouth daily.  Cliffton Asters Petrolatum-Mineral Oil (SYSTANE NIGHTTIME) OINT Place 1 application into the left eye at bedtime. Apply to left eye at night    No facility-administered encounter medications on file as of 04/07/2017.      Review of Systems  Review of Systems  Constitutional: Negative for activity change, appetite change, chills, diaphoresis, fatigue and fever.  HENT: Negative for mouth sores, postnasal drip, rhinorrhea, sinus pain and sore throat.   Respiratory: Positive for  cough, Cardiovascular: Negative for chest pain, palpitations and leg swelling.  Gastrointestinal: Negative for abdominal distention, abdominal pain, constipation, diarrhea, nausea and vomiting.  Genitourinary: Negative for dysuria and frequency.  Musculoskeletal: Negative for arthralgias, joint swelling and myalgias.  Skin: Negative for rash.  Neurological: Negative for dizziness, syncope, weakness, light-headedness and numbness.  Psychiatric/Behavioral: Negative for behavioral problems, confusion and sleep disturbance.     Immunization History  Administered Date(s) Administered  . Influenza-Unspecified 03/28/2014, 03/23/2016, 03/25/2017  . Pneumococcal Conjugate-13 04/21/2015  . Pneumococcal-Unspecified  11/01/2009, 03/30/2016  . Tdap 03/17/2017   Pertinent  Health Maintenance Due  Topic Date Due  . COLONOSCOPY  10/30/2025 (Originally 11/08/1995)  . INFLUENZA VACCINE  Completed  . PNA vac Low Risk Adult  Completed   Fall Risk  04/05/2017 03/17/2017  Falls in the past year? No No  Risk for fall due to : Impaired balance/gait;Impaired mobility;Medication side effect;Impaired vision;History of fall(s) -   Functional Status Survey:    Vitals:   04/07/17 0940  BP: 109/62  Pulse: 86  Resp: (!) 22  Temp: (!) 97 F (36.1 C)  TempSrc: Oral  SpO2: 93%  Weight: 181 lb 12.8 oz (82.5 kg)  Height: 5' 8.5" (1.74 m)   Body mass index is 27.24 kg/m. Physical Exam  Constitutional: He is oriented to person, place, and time. He appears well-developed and well-nourished.  HENT:  Head: Normocephalic.  Mouth/Throat: Oropharynx is clear and moist.  Eyes: Pupils are equal, round, and reactive to light.  His Right Eye is still Red  Neck: Neck supple.  Cardiovascular: Normal rate and normal heart sounds.   Pulmonary/Chest: Effort normal. No respiratory distress. He has wheezes. He has rales.  Patient has Expiratory Wheezing.  Abdominal: Soft. Bowel sounds are normal. He exhibits  no distension. There is no tenderness. There is no rebound.  Musculoskeletal: He exhibits edema.  Neurological: He is alert and oriented to person, place, and time.  Skin: Skin is warm and dry.  Psychiatric: He has a normal mood and affect. His behavior is normal. Thought content normal.    Labs reviewed:  Recent Labs  10/29/16 0412  11/01/16 0700  11/04/16 0438  01/24/17 1045 03/09/17 0711 04/06/17 0701  NA 147*  < > 146*  < > 143  < > 140 139 137  K 2.6*  < > 2.8*  < > 2.9*  < > 4.9 4.0 4.1  CL 115*  < > 110  < > 107  < > 106 105 100*  CO2 28  < > 29  < > 31  < > 25 26 28   GLUCOSE 96  < > 82  < > 90  < > 89 84 93  BUN 9  < > 14  < > 12  < > 25* 24* 21*  CREATININE 0.89  < > 0.85  < > 0.86  < > 1.12 0.99  1.03  CALCIUM 7.9*  < > 8.1*  < > 7.9*  < > 9.1 8.7* 8.8*  MG 1.6*  --  1.8  --  1.6*  --   --   --   --   < > = values in this interval not displayed.  Recent Labs  10/21/16 0914 11/03/16 0455 12/21/16 0500  AST 38 26 23  ALT 45 42 29  ALKPHOS 59 51 58  BILITOT 0.7 0.4 0.4  PROT 8.2* 6.4* 7.0  ALBUMIN 3.5 2.9* 3.2*    Recent Labs  11/24/16 0718 12/21/16 0500 01/06/17 0715 03/09/17 0711  WBC 8.6 7.8  --  7.7  NEUTROABS 5.0 4.2  --  4.5  HGB 14.4 13.9  --  15.1  HCT 44.3 44.1 44.5 46.2  MCV 92.3 93.4  --  91.5  PLT 308 280  --  269   Lab Results  Component Value Date   TSH <0.010 (L) 03/09/2017   Lab Results  Component Value Date   HGBA1C  03/27/2009    6.0 (NOTE) The ADA recommends the following therapeutic goal for glycemic control related to Hgb A1c measurement: Goal of therapy: <6.5 Hgb A1c  Reference: American Diabetes Association: Clinical Practice Recommendations 2010, Diabetes Care, 2010, 33: (Suppl  1).   Lab Results  Component Value Date   CHOL 106 03/09/2017   HDL 31 (L) 03/09/2017   LDLCALC 67 03/09/2017   TRIG 42 03/09/2017   CHOLHDL 3.4 03/09/2017    Significant Diagnostic Results in last 30 days:  No results found.  Assessment/Plan  COPD Patient on Spiriva Will start him On Symbicort BID. Continue Duo Nebs as needed.  Essential hypertension Discontinue Lisinopril as BP has been running low.  Will Continue Labetolol  Conjunctivitis Will start him On Ocuflox and Continue Pentol  Recurrent PE and chronic DVT On Coumadin Adjusted dose for His INR. Was 2.36 yesterday.  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resolved. Continue on Miralax  Subclinical hyperthyroidism Follow Up With Endocrinology  Hypokalemia On Potassium supplement  Aggressive treatment  His H/o Volvulus.  S/P CVA Patient stable. BP controlled. He is now on Dyphagic Diet and doing well. LDL was 67 in 09/18.  No ton Any statin.  Family/ staff Communication:   Labs/tests  ordered:    Total time spent in this patient care encounter was 45_ minutes; greater than 50% of the  visit spent counseling patient, reviewing records , Labs and coordinating care for problems addressed at this encounter.

## 2017-04-11 ENCOUNTER — Encounter (HOSPITAL_COMMUNITY)
Admit: 2017-04-11 | Discharge: 2017-04-11 | Disposition: A | Discharge status: Home / Self Care | Payer: Medicare Other | Attending: "Endocrinology | Admitting: "Endocrinology

## 2017-04-11 ENCOUNTER — Encounter (HOSPITAL_COMMUNITY): Payer: Self-pay

## 2017-04-11 DIAGNOSIS — E0590 Thyrotoxicosis, unspecified without thyrotoxic crisis or storm: Secondary | ICD-10-CM

## 2017-04-12 ENCOUNTER — Encounter (HOSPITAL_COMMUNITY): Payer: Medicare Other

## 2017-04-18 ENCOUNTER — Encounter (HOSPITAL_COMMUNITY)
Admit: 2017-04-18 | Discharge: 2017-04-18 | Disposition: A | Discharge status: Home / Self Care | Payer: Medicare Other | Attending: "Endocrinology | Admitting: "Endocrinology

## 2017-04-18 ENCOUNTER — Encounter (HOSPITAL_COMMUNITY): Payer: Self-pay

## 2017-04-18 DIAGNOSIS — M6281 Muscle weakness (generalized): Secondary | ICD-10-CM | POA: Diagnosis not present

## 2017-04-18 DIAGNOSIS — I639 Cerebral infarction, unspecified: Secondary | ICD-10-CM | POA: Diagnosis not present

## 2017-04-18 DIAGNOSIS — I69328 Other speech and language deficits following cerebral infarction: Secondary | ICD-10-CM | POA: Diagnosis not present

## 2017-04-18 DIAGNOSIS — R293 Abnormal posture: Secondary | ICD-10-CM | POA: Diagnosis not present

## 2017-04-18 DIAGNOSIS — E0590 Thyrotoxicosis, unspecified without thyrotoxic crisis or storm: Secondary | ICD-10-CM | POA: Insufficient documentation

## 2017-04-18 MED ORDER — SODIUM IODIDE I-123 7.4 MBQ CAPS
363.0000 | ORAL_CAPSULE | Freq: Once | ORAL | Status: AC
  Administered 2017-04-18: 363 via ORAL

## 2017-04-19 ENCOUNTER — Encounter (HOSPITAL_COMMUNITY)
Admit: 2017-04-19 | Discharge: 2017-04-19 | Disposition: A | Discharge status: Home / Self Care | Payer: Medicare Other | Attending: "Endocrinology | Admitting: "Endocrinology

## 2017-04-19 DIAGNOSIS — R293 Abnormal posture: Secondary | ICD-10-CM | POA: Diagnosis not present

## 2017-04-19 DIAGNOSIS — I639 Cerebral infarction, unspecified: Secondary | ICD-10-CM | POA: Diagnosis not present

## 2017-04-19 DIAGNOSIS — E0590 Thyrotoxicosis, unspecified without thyrotoxic crisis or storm: Secondary | ICD-10-CM | POA: Diagnosis not present

## 2017-04-19 DIAGNOSIS — I69328 Other speech and language deficits following cerebral infarction: Secondary | ICD-10-CM | POA: Diagnosis not present

## 2017-04-19 DIAGNOSIS — M6281 Muscle weakness (generalized): Secondary | ICD-10-CM | POA: Diagnosis not present

## 2017-04-20 ENCOUNTER — Encounter (HOSPITAL_COMMUNITY)
Admission: RE | Admit: 2017-04-20 | Discharge: 2017-04-20 | Disposition: A | Discharge status: Home / Self Care | Payer: Medicare Other | Source: Skilled Nursing Facility | Attending: Internal Medicine | Admitting: Internal Medicine

## 2017-04-20 ENCOUNTER — Ambulatory Visit (INDEPENDENT_AMBULATORY_CARE_PROVIDER_SITE_OTHER): Payer: Medicare Other | Admitting: "Endocrinology

## 2017-04-20 ENCOUNTER — Encounter: Payer: Self-pay | Admitting: "Endocrinology

## 2017-04-20 ENCOUNTER — Encounter: Payer: Self-pay | Admitting: Internal Medicine

## 2017-04-20 ENCOUNTER — Non-Acute Institutional Stay (SKILLED_NURSING_FACILITY): Payer: Medicare Other | Admitting: Internal Medicine

## 2017-04-20 VITALS — BP 139/80 | HR 94 | Ht 70.0 in

## 2017-04-20 DIAGNOSIS — R293 Abnormal posture: Secondary | ICD-10-CM | POA: Insufficient documentation

## 2017-04-20 DIAGNOSIS — I10 Essential (primary) hypertension: Secondary | ICD-10-CM | POA: Diagnosis not present

## 2017-04-20 DIAGNOSIS — I69328 Other speech and language deficits following cerebral infarction: Secondary | ICD-10-CM | POA: Insufficient documentation

## 2017-04-20 DIAGNOSIS — R1312 Dysphagia, oropharyngeal phase: Secondary | ICD-10-CM | POA: Diagnosis not present

## 2017-04-20 DIAGNOSIS — Z7901 Long term (current) use of anticoagulants: Secondary | ICD-10-CM | POA: Diagnosis not present

## 2017-04-20 DIAGNOSIS — I739 Peripheral vascular disease, unspecified: Secondary | ICD-10-CM | POA: Diagnosis not present

## 2017-04-20 DIAGNOSIS — E0590 Thyrotoxicosis, unspecified without thyrotoxic crisis or storm: Secondary | ICD-10-CM

## 2017-04-20 DIAGNOSIS — I639 Cerebral infarction, unspecified: Secondary | ICD-10-CM

## 2017-04-20 DIAGNOSIS — K562 Volvulus: Secondary | ICD-10-CM | POA: Insufficient documentation

## 2017-04-20 DIAGNOSIS — M2042 Other hammer toe(s) (acquired), left foot: Secondary | ICD-10-CM | POA: Diagnosis not present

## 2017-04-20 DIAGNOSIS — I6389 Other cerebral infarction: Secondary | ICD-10-CM | POA: Diagnosis not present

## 2017-04-20 DIAGNOSIS — B351 Tinea unguium: Secondary | ICD-10-CM | POA: Diagnosis not present

## 2017-04-20 DIAGNOSIS — M2041 Other hammer toe(s) (acquired), right foot: Secondary | ICD-10-CM | POA: Diagnosis not present

## 2017-04-20 DIAGNOSIS — M6281 Muscle weakness (generalized): Secondary | ICD-10-CM | POA: Diagnosis not present

## 2017-04-20 LAB — PROTIME-INR
INR: 3.33
Prothrombin Time: 33.5 seconds — ABNORMAL HIGH (ref 11.4–15.2)

## 2017-04-20 NOTE — Progress Notes (Signed)
Subjective:    Patient ID: [name] [name], male    DOB: 1946-01-24, PCP Mahlon Gammon, MD   Past Medical History:  Diagnosis Date  . Dysphasia   . Fatty liver   . Hemiplegia (HCC)   . Hypertension   . Pneumonia   . Pulmonary embolism (HCC)   . 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(HCC)   . Stroke [facility])    Past Surgical History:  Procedure Laterality Date  . FLEXIBLE SIGMOIDOSCOPY N/A 10/21/2016   Procedure: FLEXIBLE SIGMOIDOSCOPY;  Surgeon: Malissa Hippo, MD;  [facility];  Service: Endoscopy;  Laterality: N/A;  . FLEXIBLE SIGMOIDOSCOPY N/A 10/25/2016   Procedure: FLEXIBLE SIGMOIDOSCOPY;  Surgeon: Malissa Hippo, MD;  Location[facility];  Service: Endoscopy;  Laterality: N/A;  . unable     Social History   Social History  . Marital status: Single    Spouse name: N/A  . Number of children: N/A  . Years of education: N/A   Social History Main Topics  . Smoking status: Never Smoker  . Smokeless tobacco: Never Used  . Alcohol use No  . Drug use: No  . Sexual activity: Not Asked   Other Topics Concern  . None   Social History Narrative  . None   Outpatient Encounter Prescriptions as of 04/20/2017  Medication Sig  . acetaminophen (TYLENOL) 325 MG tablet Take 650 mg by mouth every 6 (six) hours as needed for moderate pain.   Marland Kitchen betamethasone dipropionate (DIPROLENE) 0.05 % cream Apply to legs and hands. Do not apply to face groin, axilla. Try Cerave first twice a day.  . carboxymethylcellulose (REFRESH PLUS) 0.5 % SOLN Place 1 drop into both eyes 2 (two) times daily.  . Emollient (CERAVE) CREA Apply 1 application topically daily as needed for itching once a day  . Eyelid Cleansers (SYSTANE LID WIPES EX) Apply 1 application topically 2 (two) times daily. Start occusoft lid scrubs: clean eyelids of oil and debris BID OU ( each eye ) before administering eye drops.   Marland Kitchen ipratropium (ATROVENT) 0.02 % nebulizer solution Take 0.5 mg by nebulization every 6 (six) hours.   Marland Kitchen labetalol (NORMODYNE) 200 MG tablet Take 200 mg by mouth daily. For HTN  . loratadine (CLARITIN) 10 MG tablet Take 10 mg by mouth daily.  . polyethylene glycol (MIRALAX / GLYCOLAX) packet Take 17 g by mouth at bedtime.  . potassium chloride (K-DUR,KLOR-CON) 10 MEQ tablet Take 40 mEq by mouth daily.   . sennosides-docusate sodium (SENOKOT-S) 8.6-50 MG tablet Take 1 tablet by mouth at bedtime. For constipation  . tiotropium (SPIRIVA HANDIHALER) 18 MCG inhalation capsule Place 18 mcg into inhaler and inhale daily.  Marland Kitchen warfarin (COUMADIN) 3 MG tablet Take 3 mg by mouth daily.  Cliffton Asters Petrolatum-Mineral Oil (SYSTANE NIGHTTIME) OINT Place 1 application into the left eye at bedtime. Apply to left eye at night   . [DISCONTINUED] lisinopril (PRINIVIL,ZESTRIL) 20 MG tablet Take 20 mg by mouth daily.  . [DISCONTINUED] olopatadine (PATANOL) 0.1 % ophthalmic solution Place 1 drop into both eyes 2 (two) times daily.   No facility-administered encounter medications on file as of 04/20/2017.    ALLERGIES: Allergies  Allergen Reactions  . Penicillins     Has patient had a PCN reaction causing immediate rash, facial/tongue/throat swelling, SOB or lightheadedness with hypotension: unknown Has patient had a PCN reaction causing severe rash involving mucus membranes or skin necrosis: unknown Has patient had a PCN reaction that required hospitalization: unknown  Has patient had a PCN reaction occurring within the last 10 years: unknown If all of the above answers are "NO", then may proceed with Cephalosporin use. 5/3 Tolerated Cefepime x 1 dose in ED.     VACCINATION STATUS: Immunization History  Administered Date(s) Administered  . Influenza-Unspecified 03/28/2014, 03/23/2016, 03/25/2017  . Pneumococcal Conjugate-13 04/21/2015  . Pneumococcal-Unspecified 11/01/2009, 03/30/2016  . Tdap 03/17/2017    HPI [name] [name] is 71 y.o. male who presents today with a medical history as above. He has  multiple medical problems including stroke with right-sided hemiplegia, nursing home resident. - He also has dysphagia, was found to be losing weight. Routine lab work showed suppressed TSH of <0.010 in May 2018. On subsequent repeat labs from 03/09/2017 TSH remained suppressed at less than 0.01, total T3 high normal at 163 and total T4 was high normal at 11.3. - He was sent for thyroid uptake and scan which showed nonfocal elevated uptake of 42%.  He is unable to provide history, accompanied by his brother not aware of the details of his medical problems. he has been dealing with symptoms of  loss of weight, sleep disturbance. 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   Review of Systems  Constitutional: + weight loss ( lost 6 more pounds since last visit), + fatigue, no subjective hyperthermia, no subjective hypothermia Eyes: no blurry vision, no xerophthalmia ENT: no sore throat, no nodules palpated in throat, no dysphagia/odynophagia, no hoarseness Cardiovascular: no Chest Pain, no Shortness of Breath, no palpitations, no leg swelling Respiratory: no cough, no SOB Gastrointestinal: no Nausea/Vomiting/Diarhhea Musculoskeletal: + Wheelchair-bound  Skin: no rashes Neurological: no tremors, no numbness, no tingling, no dizziness Psychiatric: no depression, no anxiety  Objective:    BP 139/80   Pulse 94   Ht 5\' 10"  (1.778 m)   Wt Readings from Last 3 Encounters:  04/07/17 181 lb 12.8 oz (82.5 kg)  03/17/17 187 lb (84.8 kg)  02/07/17 186 lb 12.8 oz (84.7 kg)    Physical Exam  Constitutional:  not in acute distress,  + wheelchair-bound, with stiff and spastic right upper and lower extremities Eyes: PERRLA, EOMI, no exophthalmos, + drooling ENT: moist mucous membranes, no thyromegaly, no cervical lymphadenopathy Cardiovascular: normal precordial activity, + tachycardic at 105, no  Murmur/Rubs/Gallops Respiratory:  adequate breathing efforts, no gross chest deformity, Clear to auscultation bilaterally Gastrointestinal: abdomen soft, Non -tender, No distension, Bowel Sounds present Musculoskeletal: + + wheelchair-bound, with stiff and spastic right upper and lower extremities Skin: moist, warm, no rashes Neurological: no tremor with outstretched  Left hand, Deep tendon reflexes normal in all four extremities.  CMP ( most recent) CMP     Component Value Date/Time   NA 137 04/06/2017 0701   NA 142 09/12/2015   K 4.1 04/06/2017 0701   CL 100 (L) 04/06/2017 0701   CO2 28 04/06/2017 0701   GLUCOSE 93 04/06/2017 0701   BUN 21 (H) 04/06/2017 0701   BUN 18 09/12/2015   CREATININE 1.03 04/06/2017 0701   CALCIUM 8.8 (L) 04/06/2017 0701   PROT 7.0 12/21/2016 0500   ALBUMIN 3.2 (L) 12/21/2016 0500   AST 23 12/21/2016 0500   ALT 29 12/21/2016 0500   ALKPHOS 58 12/21/2016 0500   BILITOT 0.4 12/21/2016 0500   GFRNONAA >60 04/06/2017 0701   GFRAA >60 04/06/2017 0701     Diabetic Labs (most recent): Lab Results  Component  Value Date   HGBA1C  03/27/2009    6.0 (NOTE) The ADA recommends the following therapeutic goal for glycemic control related to Hgb A1c measurement: Goal of therapy: <6.5 Hgb A1c  Reference: American Diabetes Association: Clinical Practice Recommendations 2010, Diabetes Care, 2010, 33: (Suppl  1).     Lipid Panel ( most recent) Lipid Panel     Component Value Date/Time   CHOL 106 03/09/2017 0712   TRIG 42 03/09/2017 0712   HDL 31 (L) 03/09/2017 0712   CHOLHDL 3.4 03/09/2017 0712   VLDL 8 03/09/2017 0712   LDLCALC 67 03/09/2017 0712     Results for TREVIONE, WERT (MRN [id number]) as of 04/05/2017 09:30  Ref. Range 12/19/2015 07:35 10/27/2016 05:27 03/09/2017 07:11 03/09/2017 07:12  TSH Latest Ref Range: 0.350 - 4.500 uIU/mL 1.285 <0.010 (L)  <0.010 (L)  Triiodothyronine (T3) Latest Ref Range: 71 - 180 ng/dL   409   Thyroxine (T4) Latest Ref  Range: 4.5 - 12.0 ug/dL   81.1    Thyroid uptake and scan showed nonfocal, elevated uptake of 42%   Assessment & Plan:   1.  hyperthyroidism  - His thyroid uptake and scan is consistent with hyperthyroidism, nonfocal elevated uptake of 42%. - He is a poor historian, has some subtle symptoms including tachycardia, significant weight loss.  - Given his multiple medical problems including stroke, he will benefit from treatment for hyperthyroidism. - I discussed options of therapy with him. The best option would be thyroid ablation with I-131 with subsequent replacement with appropriate dose thyroid hormone. - I will arrange for  thyroid ablation with I-131. He will return in 9 weeks with repeat thyroid function tests for evaluation. - He is already on a beta blocker, labetalol 200 mg daily, pulse rate is at 94. I did not initiate any new medications today.   - I advised patient to maintain close follow up with Mahlon Gammon, MD for primary care needs. Follow up plan: Return in about 9 weeks (around 06/22/2017) for follow up with labs after I131 therapy, follow up with pre-visit labs.  Marquis Lunch, MD [facility] Phone: [phone]  Fax: [phone]   04/20/2017, 2:34 PM This note was partially dictated with voice recognition software. Similar sounding words can be transcribed inadequately or may not  be corrected upon review.

## 2017-04-20 NOTE — Progress Notes (Signed)
Location:   [facility] Room Number: 112/W Place of Service:  SNF [phone]) Provider:  Sabino Dick, MD  [name], [name]  Extended Emergency Contact Information Primary Emergency Contact: [name] Address: [address]          [place], [place] [address] [place] Home Phone: [phone] Mobile Phone: [phone] Relation: Brother  Code Status:  Full Code Goals of care: Advanced Directive information Advanced Directives 04/20/2017  Does Patient Have a Medical Advance Directive? Yes  Type of Advance Directive (No Data)  Does patient want to make changes to medical advance directive? No - Patient declined  Copy of Healthcare Power of Attorney in Chart? No - copy requested  Would patient like information on creating a medical advance directive? No - Patient declined    Chief complaint-acute visit secondary to elevated INR on chronic Coumadin with history of CVA-DVT pulmonary embolism   HPI:  Pt is a 71 y.o. male seen today for an acute visit for elevated INR  of 3.33 today.  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  Previous INR was 2.362 weeks ago.  He is on Coumadin 3 mg a day and has been relatively stable recently on this.  There has been no increased bruising or bleeding.      Past Medical History:  Diagnosis Date  . Dysphasia   . Fatty liver   . Hemiplegia (HCC)   . Hypertension   . Pneumonia   . Pulmonary embolism (HCC)   . 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(HCC)   . Stroke [facility])    Past Surgical History:  Procedure Laterality Date  . FLEXIBLE SIGMOIDOSCOPY N/A 10/21/2016   Procedure: FLEXIBLE SIGMOIDOSCOPY;  Surgeon: Malissa Hippo, MD;  [facility];  Service: Endoscopy;  Laterality: N/A;  . FLEXIBLE SIGMOIDOSCOPY N/A 10/25/2016   Procedure: FLEXIBLE  SIGMOIDOSCOPY;  Surgeon: Malissa Hippo, MD;  [facility];  Service: Endoscopy;  Laterality: N/A;  . unable      Allergies  Allergen Reactions  . Penicillins     Has patient had a PCN reaction causing immediate rash, facial/tongue/throat swelling, SOB or lightheadedness with hypotension: unknown Has patient had a PCN reaction causing severe rash involving mucus membranes or skin necrosis: unknown Has patient had a PCN reaction that required hospitalization: unknown Has patient had a PCN reaction occurring within the last 10 years: unknown If all of the above answers are "NO", then may proceed with Cephalosporin use. 5/3 Tolerated Cefepime x 1 dose in ED.     Outpatient Encounter Prescriptions as of 04/20/2017  Medication Sig  . acetaminophen (TYLENOL) 325 MG tablet Take 650 mg by mouth every 6 (six) hours as needed for moderate pain.   Marland Kitchen betamethasone dipropionate (DIPROLENE) 0.05 % cream Apply to legs and hands. Do not apply to face groin, axilla. Try Cerave first twice a day.  . budesonide-formoterol (SYMBICORT) 80-4.5 MCG/ACT inhaler Inhale 2 puffs into the lungs 2 (two) times daily.  . carboxymethylcellulose (REFRESH PLUS) 0.5 % SOLN Place 1 drop into both eyes 2 (two) times daily.  . Emollient (CERAVE) CREA Apply 1 application topically daily as needed for itching once a day  . Eyelid Cleansers (SYSTANE LID WIPES EX) Apply 1 application topically 2 (two) times daily. Start occusoft lid scrubs: clean eyelids of  oil and debris BID OU ( each eye ) before administering eye drops.   Marland Kitchen. ipratropium (ATROVENT) 0.02 % nebulizer solution Take 0.5 mg by nebulization every 6 (six) hours.  Marland Kitchen. labetalol (NORMODYNE) 200 MG tablet Take 200 mg by mouth daily. For HTN  . loratadine (CLARITIN) 10 MG tablet Take 10 mg by mouth daily.  . polyethylene glycol (MIRALAX / GLYCOLAX) packet Take 17 g by mouth at bedtime.  . potassium chloride (K-DUR,KLOR-CON) 10 MEQ tablet Take 40 mEq by mouth  daily.   . sennosides-docusate sodium (SENOKOT-S) 8.6-50 MG tablet Take 1 tablet by mouth at bedtime. For constipation  . tiotropium (SPIRIVA HANDIHALER) 18 MCG inhalation capsule Place 18 mcg into inhaler and inhale daily.  Marland Kitchen. warfarin (COUMADIN) 3 MG tablet Take 3 mg by mouth daily.  Cliffton Asters. White Petrolatum-Mineral Oil (SYSTANE NIGHTTIME) OINT Place 1 application into the left eye at bedtime. Apply to left eye at night   . [DISCONTINUED] lisinopril (PRINIVIL,ZESTRIL) 20 MG tablet Take 20 mg by mouth daily.  . [DISCONTINUED] olopatadine (PATANOL) 0.1 % ophthalmic solution Place 1 drop into both eyes 2 (two) times daily.   No facility-administered encounter medications on file as of 04/20/2017.     Review of Systems   In general is not complaining of any increased fevers or chills.  Skin does not complain of any increased bruising or bleeding nursing has not noted any either.  Head ears eyes nose mouth and throat does not complain of visual changes beyond baseline has prescription lenses and opacity of his left eye.  Respiratory does not complain of shortness breath or cough as some chronic rhonchi which is relatively unchanged.  Cardiac does not complain of chest pain.  GU does not complaining of dysuria.  GI does not complain of abdominal pain does have a history of sigmoid volvulus which was significant earlier but he has any a nice recovery from this.  Musculoskeletal continues with right-sided weakness with history of CVA at baseline does not complaining headache or dizziness or syncope.  Psych does not complain of depression or anxiety continues to ambulate about facility in his wheelchair with a good attitude  Immunization History  Administered Date(s) Administered  . Influenza-Unspecified 03/28/2014, 03/23/2016, 03/25/2017  . Pneumococcal Conjugate-13 04/21/2015  . Pneumococcal-Unspecified 11/01/2009, 03/30/2016  . Tdap 03/17/2017   Pertinent  Health Maintenance Due  Topic  Date Due  . COLONOSCOPY  10/30/2025 (Originally 11/08/1995)  . INFLUENZA VACCINE  Completed  . PNA vac Low Risk Adult  Completed   Fall Risk  04/05/2017 03/17/2017  Falls in the past year? No No  Risk for fall due to : Impaired balance/gait;Impaired mobility;Medication side effect;Impaired vision;History of fall(s) -   Functional Status Survey:    Vitals:   04/20/17 1231  BP: 139/80  Pulse: 94  He is afebrile respirations are 19  Physical Exam In general this is a pleasant elderly male in no distress sitting comfortably in his wheelchair.  Skin is warm and dry do not note any increased bruising or bleeding.  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  Oropharynx is clear mucous membranes moist.  Chest has some scattered expiratory wheezing rhonchi which is baseline there is no labored breathing.  Heart is regular rate and rhythm without murmur gallop or rub he has chronic lower extremity edema bilaterally.  Muscle skeletal continues right-sided weakness and contracture of his right arm-has again some movement of his  right-sided extremities left-sided extremities appear to be at baseline strength intact and uses his leftt arm essentially to ambulate about facility in his wheelchair  Neurologic as noted above he continues with some slurring of his speech which is baseline.  Psych he is alert and oriented pleasant and appropriate   Labs reviewed:  Recent Labs  10/29/16 0412  11/01/16 0700  11/04/16 0438  01/24/17 1045 03/09/17 0711 04/06/17 0701  NA 147*  < > 146*  < > 143  < > 140 139 137  K 2.6*  < > 2.8*  < > 2.9*  < > 4.9 4.0 4.1  CL 115*  < > 110  < > 107  < > 106 105 100*  CO2 28  < > 29  < > 31  < > 25 26 28   GLUCOSE 96  < > 82  < > 90  < > 89 84 93  BUN 9  < > 14  < > 12  < > 25* 24* 21*  CREATININE 0.89  < > 0.85  < > 0.86  < > 1.12 0.99 1.03  CALCIUM 7.9*  < > 8.1*  < > 7.9*  < >  9.1 8.7* 8.8*  MG 1.6*  --  1.8  --  1.6*  --   --   --   --   < > = values in this interval not displayed.  Recent Labs  10/21/16 0914 11/03/16 0455 12/21/16 0500  AST 38 26 23  ALT 45 42 29  ALKPHOS 59 51 58  BILITOT 0.7 0.4 0.4  PROT 8.2* 6.4* 7.0  ALBUMIN 3.5 2.9* 3.2*    Recent Labs  11/24/16 0718 12/21/16 0500 01/06/17 0715 03/09/17 0711  WBC 8.6 7.8  --  7.7  NEUTROABS 5.0 4.2  --  4.5  HGB 14.4 13.9  --  15.1  HCT 44.3 44.1 44.5 46.2  MCV 92.3 93.4  --  91.5  PLT 308 280  --  269   Lab Results  Component Value Date   TSH <0.010 (L) 03/09/2017   Lab Results  Component Value Date   HGBA1C  03/27/2009    6.0 (NOTE) The ADA recommends the following therapeutic goal for glycemic control related to Hgb A1c measurement: Goal of therapy: <6.5 Hgb A1c  Reference: American Diabetes Association: Clinical Practice Recommendations 2010, Diabetes Care, 2010, 33: (Suppl  1).   Lab Results  Component Value Date   CHOL 106 03/09/2017   HDL 31 (L) 03/09/2017   LDLCALC 67 03/09/2017   TRIG 42 03/09/2017   CHOLHDL 3.4 03/09/2017    Significant Diagnostic Results in last 30 days:  Nm Thyroid Sng Uptake W/imaging  Result Date: 04/19/2017 CLINICAL DATA:  Subclinical hyperthyroidism. EXAM: THYROID SCAN AND UPTAKE - 24 HOURS TECHNIQUE: Following the per oral administration of I 123 sodium iodide, the patient returned at 24 hours and uptake measurements were acquired with the uptake probe centered on the neck. Thyroid imaging was performed following the intravenous administration of the Tc-67m Pertechnetate. RADIOPHARMACEUTICALS:  363 MicroCuries I 123 sodium iodide orally. COMPARISON:  None FINDINGS: The thyroid scan demonstrates homogeneous uptake in the thyroid gland. No hot or cold nodules are demonstrated. The 24 hour uptake was calculated at 41.9%.  Normal range is 5-35%. IMPRESSION: Elevated 24 hour iodine 123 uptake at 41.9%. No hot or cold thyroid nodules are  demonstrated. Electronically Signed   By: Rudie Meyer M.D.   On: 04/19/2017 16:37    Assessment/Plan  #  1-anticoagulation management with history of CVA-PE-DVT-INR is mildly elevated today will reduce his dose down to 2.5 mg of Coumadin a day and recheck this on Friday, November 2-he does not show evidence of any increased bruising or bleeding he has been quite stable actually on 3 mg previously   [id number]   r.      London Sheer, [place] [phone]

## 2017-04-21 ENCOUNTER — Non-Acute Institutional Stay (SKILLED_NURSING_FACILITY): Payer: Medicare Other | Admitting: Internal Medicine

## 2017-04-21 ENCOUNTER — Encounter: Payer: Self-pay | Admitting: Internal Medicine

## 2017-04-21 DIAGNOSIS — J410 Simple chronic bronchitis: Secondary | ICD-10-CM | POA: Diagnosis not present

## 2017-04-21 DIAGNOSIS — R279 Unspecified lack of coordination: Secondary | ICD-10-CM | POA: Diagnosis not present

## 2017-04-21 DIAGNOSIS — M6281 Muscle weakness (generalized): Secondary | ICD-10-CM | POA: Diagnosis not present

## 2017-04-21 DIAGNOSIS — H1031 Unspecified acute conjunctivitis, right eye: Secondary | ICD-10-CM | POA: Diagnosis not present

## 2017-04-21 DIAGNOSIS — I69328 Other speech and language deficits following cerebral infarction: Secondary | ICD-10-CM | POA: Diagnosis not present

## 2017-04-21 DIAGNOSIS — I2782 Chronic pulmonary embolism: Secondary | ICD-10-CM | POA: Diagnosis not present

## 2017-04-21 DIAGNOSIS — R293 Abnormal posture: Secondary | ICD-10-CM | POA: Diagnosis not present

## 2017-04-21 DIAGNOSIS — I639 Cerebral infarction, unspecified: Secondary | ICD-10-CM | POA: Diagnosis not present

## 2017-04-21 NOTE — Progress Notes (Signed)
Location:   [facility] Room Number: 112/W Place of Service:  SNF [id number]) Provider:  Lenon Curt, Freddie Breech, MD  [name], [name]  Extended Emergency Contact Information Primary Emergency Contact: [name] [name] Address: [address]          [place], [place] [address] [name] [name] of [place] Home Phone: [phone] Mobile Phone: [phone] Relation: Brother  Code Status:  Full Code Goals of care: Advanced Directive information Advanced Directives 04/21/2017  Does Patient Have a Medical Advance Directive? Yes  Type of Advance Directive (No Data)  Does patient want to make changes to medical advance directive? No - Patient declined  Copy of Healthcare Power of Attorney in Chart? No - copy requested  Would patient like information on creating a medical advance directive? No - Patient declined     Chief Complaint  Patient presents with  . Acute Visit    Irritation of the left eye    HPI:  Pt is a 71 y.o. male seen today for an acute visit for Red Eye.   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 He denies any pain or Itching in his eye.  He had Redness of left eye before and was treated with ocuflox and Patanol     Past Medical History:  Diagnosis Date  . Dysphasia   . Fatty liver   . Hemiplegia (HCC)   . Hypertension   . Pneumonia   . Pulmonary embolism (HCC)   . 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(HCC)   . Stroke [facility])    Past Surgical History:  Procedure Laterality Date  . FLEXIBLE SIGMOIDOSCOPY N/A 10/21/2016   Procedure: FLEXIBLE SIGMOIDOSCOPY;  Surgeon: [name] Hippo, MD;  Location: [facility];  Service: Endoscopy;  Laterality: N/A;  . FLEXIBLE SIGMOIDOSCOPY N/A 10/25/2016   Procedure: FLEXIBLE SIGMOIDOSCOPY;  Surgeon: [name] Hippo, MD;  Location: [facility];  Service: Endoscopy;  Laterality: N/A;  . unable      Allergies  Allergen Reactions  . Penicillins     Has patient had a PCN reaction causing immediate rash, facial/tongue/throat swelling, SOB or lightheadedness with hypotension: unknown Has patient had a PCN reaction causing severe rash involving mucus membranes or skin necrosis: unknown Has patient had a PCN reaction that required hospitalization: unknown Has patient had a PCN reaction occurring within the last 10 years: unknown If all of the above answers are "NO", then may proceed with Cephalosporin use. 5/3 Tolerated Cefepime x 1 dose in ED.     Outpatient Encounter Prescriptions as of 04/21/2017  Medication Sig  . acetaminophen (TYLENOL) 325 MG tablet Take 650 mg by mouth every 6 (six) hours as needed for moderate pain.   Marland Kitchen betamethasone dipropionate (DIPROLENE) 0.05 % cream Apply to legs and hands. Do not apply to face groin, axilla. Try Cerave first twice a day.  . budesonide-formoterol (SYMBICORT) 80-4.5 MCG/ACT inhaler Inhale 2 puffs into the lungs 2 (two) times daily.  . carboxymethylcellulose (REFRESH PLUS) 0.5 % SOLN Place 1 drop into both eyes 2 (two) times daily.  . Emollient (CERAVE) CREA Apply 1 application topically daily as needed for itching once a day  . Eyelid Cleansers (SYSTANE LID WIPES EX) Apply 1 application topically 2 (two) times daily. Start occusoft lid  scrubs: clean eyelids of oil and debris BID OU ( each eye ) before administering eye drops.   Marland Kitchen ipratropium (ATROVENT) 0.02 % nebulizer solution Take 0.5 mg by nebulization every 6 (six) hours.  Marland Kitchen labetalol (NORMODYNE) 200 MG tablet Take 200 mg by mouth daily. For HTN  . loratadine (CLARITIN) 10 MG tablet Take 10 mg by mouth daily.  . polyethylene glycol (MIRALAX / GLYCOLAX) packet Take 17 g by mouth at bedtime.  . potassium chloride (K-DUR,KLOR-CON) 10 MEQ tablet Take  40 mEq by mouth daily.   . sennosides-docusate sodium (SENOKOT-S) 8.6-50 MG tablet Take 1 tablet by mouth at bedtime. For constipation  . tiotropium (SPIRIVA HANDIHALER) 18 MCG inhalation capsule Place 18 mcg into inhaler and inhale daily.  Marland Kitchen warfarin (COUMADIN) 2.5 MG tablet Take 2.5 mg by mouth daily.  Cliffton Asters Petrolatum-Mineral Oil (SYSTANE NIGHTTIME) OINT Place 1 application into the left eye at bedtime. Apply to left eye at night   . [DISCONTINUED] warfarin (COUMADIN) 3 MG tablet Take 3 mg by mouth daily.   No facility-administered encounter medications on file as of 04/21/2017.      Review of Systems  Constitutional: Negative.   HENT: Negative.   Eyes: Positive for redness. Negative for photophobia, pain, itching and visual disturbance.  Respiratory: Negative.   Cardiovascular: Negative.   Gastrointestinal: Negative.   Musculoskeletal: Negative.   Neurological: Negative.   All other systems reviewed and are negative.   Immunization History  Administered Date(s) Administered  . Influenza-Unspecified 03/28/2014, 03/23/2016, 03/25/2017  . Pneumococcal Conjugate-13 04/21/2015  . Pneumococcal-Unspecified 11/01/2009, 03/30/2016  . Tdap 03/17/2017   Pertinent  Health Maintenance Due  Topic Date Due  . COLONOSCOPY  10/30/2025 (Originally 11/08/1995)  . INFLUENZA VACCINE  Completed  . PNA vac Low Risk Adult  Completed   Fall Risk  04/05/2017 03/17/2017  Falls in the past year? No No  Risk for fall due to : Impaired balance/gait;Impaired mobility;Medication side effect;Impaired vision;History of fall(s) -   Functional Status Survey:    Vitals:   04/21/17 1023  BP: 138/76  Pulse: 92  Resp: (!) 22  Temp: 98.4 F (36.9 C)  TempSrc: Oral  SpO2: 93%   There is no height or weight on file to calculate BMI. Physical Exam  Constitutional: He appears well-developed and well-nourished.  HENT:  Head: Normocephalic.  Mouth/Throat: Oropharynx is clear and moist.  Eyes: Pupils  are equal, round, and reactive to light. EOM are normal. Right eye exhibits no discharge. Left eye exhibits discharge. Right conjunctiva is not injected. Left conjunctiva is injected.  Neck: Neck supple.  Cardiovascular: Normal rate and normal heart sounds.   Pulmonary/Chest: Effort normal and breath sounds normal. No respiratory distress. He has no wheezes. He has no rales.  Abdominal: Soft. Bowel sounds are normal. He exhibits no distension. There is no tenderness. There is no rebound.  Musculoskeletal: He exhibits edema.  Neurological: He is alert.  Skin: Skin is warm and dry.    Labs reviewed:  Recent Labs  10/29/16 0412  11/01/16 0700  11/04/16 0438  01/24/17 1045 03/09/17 0711 04/06/17 0701  NA 147*  < > 146*  < > 143  < > 140 139 137  K 2.6*  < > 2.8*  < > 2.9*  < > 4.9 4.0 4.1  CL 115*  < > 110  < > 107  < > 106 105 100*  CO2 28  < > 29  < > 31  < > 25 26 28  GLUCOSE 96  < > 82  < > 90  < > 89 84 93  BUN 9  < > 14  < > 12  < > 25* 24* 21*  CREATININE 0.89  < > 0.85  < > 0.86  < > 1.12 0.99 1.03  CALCIUM 7.9*  < > 8.1*  < > 7.9*  < > 9.1 8.7* 8.8*  MG 1.6*  --  1.8  --  1.6*  --   --   --   --   < > = values in this interval not displayed.  Recent Labs  10/21/16 0914 11/03/16 0455 12/21/16 0500  AST 38 26 23  ALT 45 42 29  ALKPHOS 59 51 58  BILITOT 0.7 0.4 0.4  PROT 8.2* 6.4* 7.0  ALBUMIN 3.5 2.9* 3.2*    Recent Labs  11/24/16 0718 12/21/16 0500 01/06/17 0715 03/09/17 0711  WBC 8.6 7.8  --  7.7  NEUTROABS 5.0 4.2  --  4.5  HGB 14.4 13.9  --  15.1  HCT 44.3 44.1 44.5 46.2  MCV 92.3 93.4  --  91.5  PLT 308 280  --  269   Lab Results  Component Value Date   TSH <0.010 (L) 03/09/2017   Lab Results  Component Value Date   HGBA1C  03/27/2009    6.0 (NOTE) The ADA recommends the following therapeutic goal for glycemic control related to Hgb A1c measurement: Goal of therapy: <6.5 Hgb A1c  Reference: American Diabetes Association: Clinical Practice  Recommendations 2010, Diabetes Care, 2010, 33: (Suppl  1).   Lab Results  Component Value Date   CHOL 106 03/09/2017   HDL 31 (L) 03/09/2017   LDLCALC 67 03/09/2017   TRIG 42 03/09/2017   CHOLHDL 3.4 03/09/2017    Significant Diagnostic Results in last 30 days:  Nm Thyroid Sng Uptake W/imaging  Result Date: 04/19/2017 CLINICAL DATA:  Subclinical hyperthyroidism. EXAM: THYROID SCAN AND UPTAKE - 24 HOURS TECHNIQUE: Following the per oral administration of I 123 sodium iodide, the patient returned at 24 hours and uptake measurements were acquired with the uptake probe centered on the neck. Thyroid imaging was performed following the intravenous administration of the Tc-332m Pertechnetate. RADIOPHARMACEUTICALS:  363 MicroCuries I 123 sodium iodide orally. COMPARISON:  None FINDINGS: The thyroid scan demonstrates homogeneous uptake in the thyroid gland. No hot or cold nodules are demonstrated. The 24 hour uptake was calculated at 41.9%.  Normal range is 5-35%. IMPRESSION: Elevated 24 hour iodine 123 uptake at 41.9%. No hot or cold thyroid nodules are demonstrated. Electronically Signed   By: Rudie MeyerP.  Gallerani M.D.   On: 04/19/2017 16:37    Assessment/Plan  Right Eye Conjunctivitis Will Start him on Ocuflox. Also d/w nurses to Cut and clean his Nails.  Subclinical hyperthyroidism Patient would get Thyroid Ablation with I 131 per Dr Fransico HimNida  COPD Per Nurses having difficulty using inhalers.  Will try them with Spacer  Recurrent PE and DVT Coumadin dose decreased to adjust INR. Family/ staff Communication:   Labs/tests ordered:

## 2017-04-22 ENCOUNTER — Encounter (HOSPITAL_COMMUNITY)
Admission: RE | Admit: 2017-04-22 | Discharge: 2017-04-22 | Disposition: A | Discharge status: Home / Self Care | Payer: Medicare Other | Source: Skilled Nursing Facility | Attending: Internal Medicine | Admitting: Internal Medicine

## 2017-04-22 DIAGNOSIS — M6281 Muscle weakness (generalized): Secondary | ICD-10-CM | POA: Diagnosis not present

## 2017-04-22 DIAGNOSIS — K562 Volvulus: Secondary | ICD-10-CM | POA: Insufficient documentation

## 2017-04-22 DIAGNOSIS — I69328 Other speech and language deficits following cerebral infarction: Secondary | ICD-10-CM | POA: Diagnosis not present

## 2017-04-22 DIAGNOSIS — I10 Essential (primary) hypertension: Secondary | ICD-10-CM | POA: Insufficient documentation

## 2017-04-22 DIAGNOSIS — R1312 Dysphagia, oropharyngeal phase: Secondary | ICD-10-CM | POA: Diagnosis not present

## 2017-04-22 DIAGNOSIS — R293 Abnormal posture: Secondary | ICD-10-CM | POA: Diagnosis not present

## 2017-04-22 DIAGNOSIS — I639 Cerebral infarction, unspecified: Secondary | ICD-10-CM | POA: Diagnosis not present

## 2017-04-22 DIAGNOSIS — R279 Unspecified lack of coordination: Secondary | ICD-10-CM | POA: Diagnosis not present

## 2017-04-22 DIAGNOSIS — Z7901 Long term (current) use of anticoagulants: Secondary | ICD-10-CM | POA: Insufficient documentation

## 2017-04-22 LAB — PROTIME-INR
INR: 3.09
PROTHROMBIN TIME: 31.6 s — AB (ref 11.4–15.2)

## 2017-04-25 ENCOUNTER — Encounter (HOSPITAL_COMMUNITY)
Admission: RE | Admit: 2017-04-25 | Discharge: 2017-04-25 | Disposition: A | Discharge status: Home / Self Care | Payer: Medicare Other | Source: Skilled Nursing Facility | Attending: *Deleted | Admitting: *Deleted

## 2017-04-25 DIAGNOSIS — I69328 Other speech and language deficits following cerebral infarction: Secondary | ICD-10-CM | POA: Diagnosis not present

## 2017-04-25 DIAGNOSIS — R293 Abnormal posture: Secondary | ICD-10-CM | POA: Diagnosis not present

## 2017-04-25 DIAGNOSIS — Z7901 Long term (current) use of anticoagulants: Secondary | ICD-10-CM | POA: Diagnosis not present

## 2017-04-25 DIAGNOSIS — I10 Essential (primary) hypertension: Secondary | ICD-10-CM | POA: Diagnosis not present

## 2017-04-25 DIAGNOSIS — I639 Cerebral infarction, unspecified: Secondary | ICD-10-CM | POA: Diagnosis not present

## 2017-04-25 DIAGNOSIS — R1312 Dysphagia, oropharyngeal phase: Secondary | ICD-10-CM | POA: Diagnosis not present

## 2017-04-25 DIAGNOSIS — M6281 Muscle weakness (generalized): Secondary | ICD-10-CM | POA: Diagnosis not present

## 2017-04-25 DIAGNOSIS — R279 Unspecified lack of coordination: Secondary | ICD-10-CM | POA: Diagnosis not present

## 2017-04-25 LAB — PROTIME-INR
INR: 2.14
PROTHROMBIN TIME: 23.7 s — AB (ref 11.4–15.2)

## 2017-04-27 DIAGNOSIS — R293 Abnormal posture: Secondary | ICD-10-CM | POA: Diagnosis not present

## 2017-04-27 DIAGNOSIS — R279 Unspecified lack of coordination: Secondary | ICD-10-CM | POA: Diagnosis not present

## 2017-04-27 DIAGNOSIS — I69328 Other speech and language deficits following cerebral infarction: Secondary | ICD-10-CM | POA: Diagnosis not present

## 2017-04-27 DIAGNOSIS — I639 Cerebral infarction, unspecified: Secondary | ICD-10-CM | POA: Diagnosis not present

## 2017-04-27 DIAGNOSIS — M6281 Muscle weakness (generalized): Secondary | ICD-10-CM | POA: Diagnosis not present

## 2017-04-28 DIAGNOSIS — I639 Cerebral infarction, unspecified: Secondary | ICD-10-CM | POA: Diagnosis not present

## 2017-04-28 DIAGNOSIS — I69328 Other speech and language deficits following cerebral infarction: Secondary | ICD-10-CM | POA: Diagnosis not present

## 2017-04-28 DIAGNOSIS — R293 Abnormal posture: Secondary | ICD-10-CM | POA: Diagnosis not present

## 2017-04-28 DIAGNOSIS — M6281 Muscle weakness (generalized): Secondary | ICD-10-CM | POA: Diagnosis not present

## 2017-04-28 DIAGNOSIS — R279 Unspecified lack of coordination: Secondary | ICD-10-CM | POA: Diagnosis not present

## 2017-04-29 ENCOUNTER — Encounter: Payer: Self-pay | Admitting: Internal Medicine

## 2017-04-29 ENCOUNTER — Non-Acute Institutional Stay (SKILLED_NURSING_FACILITY): Payer: Medicare Other | Admitting: Internal Medicine

## 2017-04-29 DIAGNOSIS — M6281 Muscle weakness (generalized): Secondary | ICD-10-CM | POA: Diagnosis not present

## 2017-04-29 DIAGNOSIS — E0590 Thyrotoxicosis, unspecified without thyrotoxic crisis or storm: Secondary | ICD-10-CM

## 2017-04-29 DIAGNOSIS — R279 Unspecified lack of coordination: Secondary | ICD-10-CM | POA: Diagnosis not present

## 2017-04-29 DIAGNOSIS — J410 Simple chronic bronchitis: Secondary | ICD-10-CM | POA: Diagnosis not present

## 2017-04-29 DIAGNOSIS — I10 Essential (primary) hypertension: Secondary | ICD-10-CM | POA: Diagnosis not present

## 2017-04-29 DIAGNOSIS — K562 Volvulus: Secondary | ICD-10-CM

## 2017-04-29 DIAGNOSIS — E876 Hypokalemia: Secondary | ICD-10-CM

## 2017-04-29 DIAGNOSIS — R293 Abnormal posture: Secondary | ICD-10-CM | POA: Diagnosis not present

## 2017-04-29 DIAGNOSIS — H109 Unspecified conjunctivitis: Secondary | ICD-10-CM

## 2017-04-29 DIAGNOSIS — I639 Cerebral infarction, unspecified: Secondary | ICD-10-CM | POA: Diagnosis not present

## 2017-04-29 DIAGNOSIS — I69328 Other speech and language deficits following cerebral infarction: Secondary | ICD-10-CM | POA: Diagnosis not present

## 2017-04-29 NOTE — Progress Notes (Signed)
Location:   [facility] Room Number: 112/W Place of Service:  SNF [id number]) Provider:  Candida Peeling, Freddie Breech, MD  [name], [name]  Extended Emergency Contact Information Primary Emergency Contact: [name] Address: [address]          [place], [place] [address] Darden Amber of [place] Home Phone: [phone] Mobile Phone: [phone] Relation: Brother  Code Status:  Full Code Goals of care: Advanced Directive information Advanced Directives 04/29/2017  Does Patient Have a Medical Advance Directive? Yes  Type of Advance Directive (No Data)  Does patient want to make changes to medical advance directive? No - Patient declined  Copy of Healthcare Power of Attorney in Chart? No - copy requested  Would patient like information on creating a medical advance directive? No - Patient declined     Chief Complaint  Patient presents with  . Medical Management of Chronic Issues    Routine Visit   For medical management of chronic medical conditions including history of CVA with right-sidedhemiparesis-COPD-history of sigmoid volvulus-subclinical hyperthyroidism-history of DVT and PE-hypertension-hypokalemia-Conjunctivitis  HPI:  Pt is a 71 y.o. male seen today for medical management of chronic diseases.  As noted above-most recent acute issue has been a somewhat persistently erythematous left eye he does have a history of left eye opacity and blindness-i treated with Patanol in the past as well asa Ocuflox  Which  appears to be more effective-she's finished a course of this but the I am somewhat erythematous againand will  Restart this I do not see any drainage at this point.  Another acute issue earlier this year was recurrent sigmoid volvulus which actually required some hospitalizations-this was treated conservatively in the hospital with decompression-he has now been stable for several months which  is encouraging he is on MiraLAX.  He also has a history of COPD and continues on Spiriva and Singul as well as duo nneeded he continues ave some chrchest congestion but es any increase shortness of breath or cough from baseline and continues to ambulate about facility in wheelchair.  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  INR is therapeutic at 2.14 on lab done earlier this week update INR is pending for next week.  He does have a history of hypertension currently on labetalol had been on lisinopril as well but this was discontinued secondary to lower blood pressures-I got a manual reading of 140/80 today-this appears comparable to listed readings bin the computer-will order these checks daily however to keep an eye on consistency  He also noted to have some weight loss and suspect some of this was due to the sigmoid volvulus issues earlier this year-he was also found to have a low TSH and is now followed by endocrinology and apparently has had thyroid ablation-and updated labs are pending again this is followed closely by [name].  He also has a history of hyperkalemia he is on potassium supplementation potassium was 4.1 on lab done last month and has been stable now for an extended period of time.  He also has a history of allergic rhinitis has been on Claritin and this continues to be stable as well.      Past Medical History:  Diagnosis Date  . Dysphasia   . Fatty liver   .  Hemiplegia (HCC)   . Hypertension   . Pneumonia   . Pulmonary embolism (HCC)   . 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(HCC)   . Stroke [facility])    Past Surgical History:  Procedure Laterality Date  . unable      Allergies  Allergen Reactions  . Penicillins     Has patient had a PCN reaction causing immediate rash, facial/tongue/throat  swelling, SOB or lightheadedness with hypotension: unknown Has patient had a PCN reaction causing severe rash involving mucus membranes or skin necrosis: unknown Has patient had a PCN reaction that required hospitalization: unknown Has patient had a PCN reaction occurring within the last 10 years: unknown If all of the above answers are "NO", then may proceed with Cephalosporin use. 5/3 Tolerated Cefepime x 1 dose in ED.     Outpatient Encounter Medications as of 04/29/2017  Medication Sig  . acetaminophen (TYLENOL) 325 MG tablet Take 650 mg by mouth every 6 (six) hours as needed for moderate pain.   Marland Kitchen betamethasone dipropionate (DIPROLENE) 0.05 % cream Apply to legs and hands. Do not apply to face groin, axilla. Try Cerave first twice a day.  . budesonide-formoterol (SYMBICORT) 80-4.5 MCG/ACT inhaler Inhale 2 puffs into the lungs 2 (two) times daily.  . carboxymethylcellulose (REFRESH PLUS) 0.5 % SOLN Place 1 drop into both eyes 2 (two) times daily.  . Emollient (CERAVE) CREA Apply 1 application topically daily as needed for itching once a day  . Eyelid Cleansers (SYSTANE LID WIPES EX) Apply 1 application topically 2 (two) times daily. Start occusoft lid scrubs: clean eyelids of oil and debris BID OU ( each eye ) before administering eye drops.   Marland Kitchen ipratropium (ATROVENT) 0.02 % nebulizer solution Take 0.5 mg by nebulization every 6 (six) hours.  Marland Kitchen labetalol (NORMODYNE) 200 MG tablet Take 200 mg by mouth daily. For HTN  . loratadine (CLARITIN) 10 MG tablet Take 10 mg by mouth daily.  . polyethylene glycol (MIRALAX / GLYCOLAX) packet Take 17 g by mouth at bedtime.  . potassium chloride (K-DUR,KLOR-CON) 10 MEQ tablet Take 40 mEq by mouth daily.   . sennosides-docusate sodium (SENOKOT-S) 8.6-50 MG tablet Take 1 tablet by mouth at bedtime. For constipation  . tiotropium (SPIRIVA HANDIHALER) 18 MCG inhalation capsule Place 18 mcg into inhaler and inhale daily.  Marland Kitchen warfarin (COUMADIN) 2.5 MG tablet  Take 2.5 mg by mouth daily.  Cliffton Asters Petrolatum-Mineral Oil (SYSTANE NIGHTTIME) OINT Place 1 application into the left eye at bedtime. Apply to left eye at night    No facility-administered encounter medications on file as of 04/29/2017.      Review of Systems   In general is not complaining of any fever or chills continues to lose a slight amount of weight.  Skin does not complain of rashes or itching.  Head ears nose mouth and throat-does not complain of sore throat-continues to complain of some left eye irritation with erythema as noted above.  Does not complain of acute visual changes in the right eye  Respiratory Is not complaining of any increased shortness breath or cough-does have a history of COPD with some chronic congested breath sounds at baseline  Cardiac does not complain of chest pain has baseline lower extremity edema most noticeable iemity.  GI does not complain of abdominal pain nausea vomiting diarrhea or constipation appears to have a fairly good appetite does not complain of abdominal pain or constipation does have a history of sigmoid volvulus  GU does not complain of dysuria.  My skeletal skeletal is not complaining of any joint pain has a history of right-sided weakness-with  History of CVA.  Neurologic is not complaining of dizziness headache syncope  Psychi--continues to be in good spirits continues to ambulate about facility in his wheelchair does not complain of anxiety or depression  Immunization History  Administered Date(s) Administered  . Influenza-Unspecified 03/28/2014, 03/23/2016, 03/25/2017  . Pneumococcal Conjugate-13 04/21/2015  . Pneumococcal-Unspecified 11/01/2009, 03/30/2016  . Tdap 03/17/2017   Pertinent  Health Maintenance Due  Topic Date Due  . COLONOSCOPY  10/30/2025 (Originally 11/08/1995)  . INFLUENZA VACCINE  Completed  . PNA vac Low Risk Adult  Completed   Fall Risk  04/05/2017 03/17/2017  Falls in the past year? No No  Risk  for fall due to : Impaired balance/gait;Impaired mobility;Medication side effect;Impaired vision;History of fall(s) -   Functional Status Survey:    Vitals:   04/29/17 1120  BP: (!) 141/90  Pulse: 85  Resp: 20  Temp: 98.1 F (36.7 C)  TempSrc: Oral  SpO2: 94%  Weight: 179 lb 12.8 oz (81.6 kg)  Height: 5' 8.5" (1.74 m)  of note manual blood pressure was 140/80 Body mass index is 26.94 kg/m. Physical Exam   In general this is a pleasant elderly male in no distress sitting in his wheelchair.  His skinis warm and dry.  Eyes-- I has prescription lenses with a chronic opacity of his left eye the conjunctiva is erythematous but I do not note any drainage from the area or erythema of the orbital site-right eye appears to be baseline conjunctiva is clear visual acuity appears grossly intact.  Chest--c ontinues with scattered rhonchi which clear fairly significantly with cough-this is baseline there is no labored breathing  Heart is regular rate and rhythm somewhat distant heart sounds-he has baseline lower extremity edema left greater than right  Abdomen is obese soft nontender with positive bowel sounds  Musculoskeletal continues with right-sided weakness with contracture of his right arm hand-does have movement of his right-sided extremities but quite limit-left-sidede xtremities appear to be intact strength wise ambulates about facility using his left-sided extremities  Neurologic as noted above continues with some slurred speech at baseline.  Psych he is alert and oriented pleasant and appropriate    Labs reviewed: Recent Labs    10/29/16 0412  11/01/16 0700  11/04/16 0438  01/24/17 1045 03/09/17 0711 04/06/17 0701  NA 147*   < > 146*   < > 143   < > 140 139 137  K 2.6*   < > 2.8*   < > 2.9*   < > 4.9 4.0 4.1  CL 115*   < > 110   < > 107   < > 106 105 100*  CO2 28   < > 29   < > 31   < > 25 26 28   GLUCOSE 96   < > 82   < > 90   < > 89 84 93  BUN 9   < > 14   < > 12    < > 25* 24* 21*  CREATININE 0.89   < > 0.85   < > 0.86   < > 1.12 0.99 1.03  CALCIUM 7.9*   < > 8.1*   < > 7.9*   < > 9.1 8.7* 8.8*  MG 1.6*  --  1.8  --  1.6*  --   --   --   --    < > = values  in this interval not displayed.   Recent Labs    10/21/16 0914 11/03/16 0455 12/21/16 0500  AST 38 26 23  ALT 45 42 29  ALKPHOS 59 51 58  BILITOT 0.7 0.4 0.4  PROT 8.2* 6.4* 7.0  ALBUMIN 3.5 2.9* 3.2*   Recent Labs    11/24/16 0718 12/21/16 0500 01/06/17 0715 03/09/17 0711  WBC 8.6 7.8  --  7.7  NEUTROABS 5.0 4.2  --  4.5  HGB 14.4 13.9  --  15.1  HCT 44.3 44.1 44.5 46.2  MCV 92.3 93.4  --  91.5  PLT 308 280  --  269   Lab Results  Component Value Date   TSH <0.010 (L) 03/09/2017   Lab Results  Component Value Date   HGBA1C  03/27/2009    6.0 (NOTE) The ADA recommends the following therapeutic goal for glycemic control related to Hgb A1c measurement: Goal of therapy: <6.5 Hgb A1c  Reference: American Diabetes Association: Clinical Practice Recommendations 2010, Diabetes Care, 2010, 33: (Suppl  1).   Lab Results  Component Value Date   CHOL 106 03/09/2017   HDL 31 (L) 03/09/2017   LDLCALC 67 03/09/2017   TRIG 42 03/09/2017   CHOLHDL 3.4 03/09/2017    Significant Diagnostic Results in last 30 days:  Nm Thyroid Sng Uptake W/imaging  Result Date: 04/19/2017 CLINICAL DATA:  Subclinical hyperthyroidism. EXAM: THYROID SCAN AND UPTAKE - 24 HOURS TECHNIQUE: Following the per oral administration of I 123 sodium iodide, the patient returned at 24 hours and uptake measurements were acquired with the uptake probe centered on the neck. Thyroid imaging was performed following the intravenous administration of the Tc-10834m Pertechnetate. RADIOPHARMACEUTICALS:  363 MicroCuries I 123 sodium iodide orally. COMPARISON:  None FINDINGS: The thyroid scan demonstrates homogeneous uptake in the thyroid gland. No hot or cold nodules are demonstrated. The 24 hour uptake was calculated at 41.9%.   Normal range is 5-35%. IMPRESSION: Elevated 24 hour iodine 123 uptake at 41.9%. No hot or cold thyroid nodules are demonstrated. Electronically Signed   By: Rudie MeyerP.  Gallerani M.D.   On: 04/19/2017 16:37    Assessment/Plan     #1 history of CVA-continues with right-sided hemiparalysisat baseline but continues to be motivated and ambulates using his left-sided extremities continues on Coumadin INR is therapeutic update INR is pending for next week  #2 history of subclinical hyperthyroidism this is followed closely by endocrinology as noted above- have recommended thyroid ablation and they are following labs which have been scheduled--TSH was less than 0.01 in September 2018-he has losweight but this has moderated lately.  #3 history COPD continues on Spiriva and Singulair as well as duo nebs when necessary-physical exam was baseline today he is not complaining of any increased shortness of breath or cough from baseline  #4-history of DVT and pulmonary embolism again continues on Coumadin as noted above INR is therapeutic  #5 hypertension as noted above systolics appear to be around 140-lisinopril was discontinued because of hypotension concerns he is on labetalol-at this point will order daily checks  and have them reviewed  #6-history of sigmoid volvulus this is stabilized on MiraLAX again this was a signt issue earlier this week requiring hospitalizations and surgical consult again nonsurgical option appears to be doing well with the Miralax  #7-hypokalemia  Potassium supplementation and this is been stable now for an extended riod of time um was 4.1 on recent mid October  Number a history of allergic rhinitis he is on Claritin this is  been stable.  #9 history of recurrentconjunctivitis hought to be bacterial will restart a course of Ocuflox and monitor I do not see any drainage to culture--but this will ha be watched arently Ocuflex has worked well in the past suspect he will need an extended  coursesounds  [id number]-of note greater than 40 minutes spent assessing patient--reviewing his chart-reviewing his labs-and coordinating and formulating a plan of care for numerous diagnoses of note greater than 50% of time spent coordinating plan of care

## 2017-04-30 DIAGNOSIS — M6281 Muscle weakness (generalized): Secondary | ICD-10-CM | POA: Diagnosis not present

## 2017-04-30 DIAGNOSIS — I639 Cerebral infarction, unspecified: Secondary | ICD-10-CM | POA: Diagnosis not present

## 2017-04-30 DIAGNOSIS — R293 Abnormal posture: Secondary | ICD-10-CM | POA: Diagnosis not present

## 2017-04-30 DIAGNOSIS — I69328 Other speech and language deficits following cerebral infarction: Secondary | ICD-10-CM | POA: Diagnosis not present

## 2017-04-30 DIAGNOSIS — R279 Unspecified lack of coordination: Secondary | ICD-10-CM | POA: Diagnosis not present

## 2017-05-02 ENCOUNTER — Encounter (HOSPITAL_COMMUNITY)
Admission: RE | Admit: 2017-05-02 | Discharge: 2017-05-02 | Disposition: A | Discharge status: Home / Self Care | Payer: Medicare Other | Source: Skilled Nursing Facility | Attending: *Deleted | Admitting: *Deleted

## 2017-05-02 DIAGNOSIS — M6281 Muscle weakness (generalized): Secondary | ICD-10-CM | POA: Diagnosis not present

## 2017-05-02 DIAGNOSIS — Z7901 Long term (current) use of anticoagulants: Secondary | ICD-10-CM | POA: Diagnosis not present

## 2017-05-02 DIAGNOSIS — I639 Cerebral infarction, unspecified: Secondary | ICD-10-CM | POA: Diagnosis not present

## 2017-05-02 DIAGNOSIS — R1312 Dysphagia, oropharyngeal phase: Secondary | ICD-10-CM | POA: Diagnosis not present

## 2017-05-02 DIAGNOSIS — R293 Abnormal posture: Secondary | ICD-10-CM | POA: Diagnosis not present

## 2017-05-02 DIAGNOSIS — I69328 Other speech and language deficits following cerebral infarction: Secondary | ICD-10-CM | POA: Diagnosis not present

## 2017-05-02 DIAGNOSIS — R279 Unspecified lack of coordination: Secondary | ICD-10-CM | POA: Diagnosis not present

## 2017-05-02 DIAGNOSIS — I10 Essential (primary) hypertension: Secondary | ICD-10-CM | POA: Diagnosis not present

## 2017-05-02 LAB — PROTIME-INR
INR: 1.74
Prothrombin Time: 20.2 seconds — ABNORMAL HIGH (ref 11.4–15.2)

## 2017-05-03 DIAGNOSIS — R279 Unspecified lack of coordination: Secondary | ICD-10-CM | POA: Diagnosis not present

## 2017-05-03 DIAGNOSIS — R293 Abnormal posture: Secondary | ICD-10-CM | POA: Diagnosis not present

## 2017-05-03 DIAGNOSIS — I69328 Other speech and language deficits following cerebral infarction: Secondary | ICD-10-CM | POA: Diagnosis not present

## 2017-05-03 DIAGNOSIS — I639 Cerebral infarction, unspecified: Secondary | ICD-10-CM | POA: Diagnosis not present

## 2017-05-03 DIAGNOSIS — M6281 Muscle weakness (generalized): Secondary | ICD-10-CM | POA: Diagnosis not present

## 2017-05-04 DIAGNOSIS — M6281 Muscle weakness (generalized): Secondary | ICD-10-CM | POA: Diagnosis not present

## 2017-05-04 DIAGNOSIS — R279 Unspecified lack of coordination: Secondary | ICD-10-CM | POA: Diagnosis not present

## 2017-05-04 DIAGNOSIS — I639 Cerebral infarction, unspecified: Secondary | ICD-10-CM | POA: Diagnosis not present

## 2017-05-04 DIAGNOSIS — R293 Abnormal posture: Secondary | ICD-10-CM | POA: Diagnosis not present

## 2017-05-04 DIAGNOSIS — I69328 Other speech and language deficits following cerebral infarction: Secondary | ICD-10-CM | POA: Diagnosis not present

## 2017-05-05 DIAGNOSIS — R293 Abnormal posture: Secondary | ICD-10-CM | POA: Diagnosis not present

## 2017-05-05 DIAGNOSIS — M6281 Muscle weakness (generalized): Secondary | ICD-10-CM | POA: Diagnosis not present

## 2017-05-05 DIAGNOSIS — R279 Unspecified lack of coordination: Secondary | ICD-10-CM | POA: Diagnosis not present

## 2017-05-05 DIAGNOSIS — I639 Cerebral infarction, unspecified: Secondary | ICD-10-CM | POA: Diagnosis not present

## 2017-05-05 DIAGNOSIS — I69328 Other speech and language deficits following cerebral infarction: Secondary | ICD-10-CM | POA: Diagnosis not present

## 2017-05-06 DIAGNOSIS — R279 Unspecified lack of coordination: Secondary | ICD-10-CM | POA: Diagnosis not present

## 2017-05-06 DIAGNOSIS — M6281 Muscle weakness (generalized): Secondary | ICD-10-CM | POA: Diagnosis not present

## 2017-05-06 DIAGNOSIS — R293 Abnormal posture: Secondary | ICD-10-CM | POA: Diagnosis not present

## 2017-05-06 DIAGNOSIS — I69328 Other speech and language deficits following cerebral infarction: Secondary | ICD-10-CM | POA: Diagnosis not present

## 2017-05-06 DIAGNOSIS — I639 Cerebral infarction, unspecified: Secondary | ICD-10-CM | POA: Diagnosis not present

## 2017-05-08 DIAGNOSIS — R279 Unspecified lack of coordination: Secondary | ICD-10-CM | POA: Diagnosis not present

## 2017-05-08 DIAGNOSIS — I639 Cerebral infarction, unspecified: Secondary | ICD-10-CM | POA: Diagnosis not present

## 2017-05-08 DIAGNOSIS — M6281 Muscle weakness (generalized): Secondary | ICD-10-CM | POA: Diagnosis not present

## 2017-05-08 DIAGNOSIS — R293 Abnormal posture: Secondary | ICD-10-CM | POA: Diagnosis not present

## 2017-05-08 DIAGNOSIS — I69328 Other speech and language deficits following cerebral infarction: Secondary | ICD-10-CM | POA: Diagnosis not present

## 2017-05-09 ENCOUNTER — Encounter (HOSPITAL_COMMUNITY)
Admission: RE | Admit: 2017-05-09 | Discharge: 2017-05-09 | Disposition: A | Discharge status: Home / Self Care | Payer: Medicare Other | Source: Skilled Nursing Facility | Attending: *Deleted | Admitting: *Deleted

## 2017-05-09 DIAGNOSIS — I10 Essential (primary) hypertension: Secondary | ICD-10-CM | POA: Diagnosis not present

## 2017-05-09 DIAGNOSIS — R279 Unspecified lack of coordination: Secondary | ICD-10-CM | POA: Diagnosis not present

## 2017-05-09 DIAGNOSIS — R293 Abnormal posture: Secondary | ICD-10-CM | POA: Diagnosis not present

## 2017-05-09 DIAGNOSIS — I639 Cerebral infarction, unspecified: Secondary | ICD-10-CM | POA: Diagnosis not present

## 2017-05-09 DIAGNOSIS — M6281 Muscle weakness (generalized): Secondary | ICD-10-CM | POA: Diagnosis not present

## 2017-05-09 DIAGNOSIS — R1312 Dysphagia, oropharyngeal phase: Secondary | ICD-10-CM | POA: Diagnosis not present

## 2017-05-09 DIAGNOSIS — I69328 Other speech and language deficits following cerebral infarction: Secondary | ICD-10-CM | POA: Diagnosis not present

## 2017-05-09 DIAGNOSIS — Z7901 Long term (current) use of anticoagulants: Secondary | ICD-10-CM | POA: Diagnosis not present

## 2017-05-09 LAB — PROTIME-INR
INR: 2
PROTHROMBIN TIME: 22.5 s — AB (ref 11.4–15.2)

## 2017-05-10 DIAGNOSIS — I639 Cerebral infarction, unspecified: Secondary | ICD-10-CM | POA: Diagnosis not present

## 2017-05-10 DIAGNOSIS — R293 Abnormal posture: Secondary | ICD-10-CM | POA: Diagnosis not present

## 2017-05-10 DIAGNOSIS — I69328 Other speech and language deficits following cerebral infarction: Secondary | ICD-10-CM | POA: Diagnosis not present

## 2017-05-10 DIAGNOSIS — M6281 Muscle weakness (generalized): Secondary | ICD-10-CM | POA: Diagnosis not present

## 2017-05-10 DIAGNOSIS — R279 Unspecified lack of coordination: Secondary | ICD-10-CM | POA: Diagnosis not present

## 2017-05-11 DIAGNOSIS — R293 Abnormal posture: Secondary | ICD-10-CM | POA: Diagnosis not present

## 2017-05-11 DIAGNOSIS — I69328 Other speech and language deficits following cerebral infarction: Secondary | ICD-10-CM | POA: Diagnosis not present

## 2017-05-11 DIAGNOSIS — R279 Unspecified lack of coordination: Secondary | ICD-10-CM | POA: Diagnosis not present

## 2017-05-11 DIAGNOSIS — M6281 Muscle weakness (generalized): Secondary | ICD-10-CM | POA: Diagnosis not present

## 2017-05-11 DIAGNOSIS — I639 Cerebral infarction, unspecified: Secondary | ICD-10-CM | POA: Diagnosis not present

## 2017-05-12 DIAGNOSIS — R293 Abnormal posture: Secondary | ICD-10-CM | POA: Diagnosis not present

## 2017-05-12 DIAGNOSIS — R279 Unspecified lack of coordination: Secondary | ICD-10-CM | POA: Diagnosis not present

## 2017-05-12 DIAGNOSIS — I639 Cerebral infarction, unspecified: Secondary | ICD-10-CM | POA: Diagnosis not present

## 2017-05-12 DIAGNOSIS — I69328 Other speech and language deficits following cerebral infarction: Secondary | ICD-10-CM | POA: Diagnosis not present

## 2017-05-12 DIAGNOSIS — M6281 Muscle weakness (generalized): Secondary | ICD-10-CM | POA: Diagnosis not present

## 2017-05-15 DIAGNOSIS — M6281 Muscle weakness (generalized): Secondary | ICD-10-CM | POA: Diagnosis not present

## 2017-05-15 DIAGNOSIS — I69328 Other speech and language deficits following cerebral infarction: Secondary | ICD-10-CM | POA: Diagnosis not present

## 2017-05-15 DIAGNOSIS — R279 Unspecified lack of coordination: Secondary | ICD-10-CM | POA: Diagnosis not present

## 2017-05-15 DIAGNOSIS — I639 Cerebral infarction, unspecified: Secondary | ICD-10-CM | POA: Diagnosis not present

## 2017-05-15 DIAGNOSIS — R293 Abnormal posture: Secondary | ICD-10-CM | POA: Diagnosis not present

## 2017-05-16 ENCOUNTER — Encounter (HOSPITAL_COMMUNITY)
Admission: RE | Admit: 2017-05-16 | Discharge: 2017-05-16 | Disposition: A | Discharge status: Home / Self Care | Payer: Medicare Other | Source: Skilled Nursing Facility | Attending: Internal Medicine | Admitting: Internal Medicine

## 2017-05-16 DIAGNOSIS — R293 Abnormal posture: Secondary | ICD-10-CM | POA: Diagnosis not present

## 2017-05-16 DIAGNOSIS — Z7901 Long term (current) use of anticoagulants: Secondary | ICD-10-CM | POA: Diagnosis not present

## 2017-05-16 DIAGNOSIS — I639 Cerebral infarction, unspecified: Secondary | ICD-10-CM | POA: Diagnosis not present

## 2017-05-16 DIAGNOSIS — R279 Unspecified lack of coordination: Secondary | ICD-10-CM | POA: Diagnosis not present

## 2017-05-16 DIAGNOSIS — M6281 Muscle weakness (generalized): Secondary | ICD-10-CM | POA: Diagnosis not present

## 2017-05-16 DIAGNOSIS — I69328 Other speech and language deficits following cerebral infarction: Secondary | ICD-10-CM | POA: Diagnosis not present

## 2017-05-16 DIAGNOSIS — R1312 Dysphagia, oropharyngeal phase: Secondary | ICD-10-CM | POA: Diagnosis not present

## 2017-05-16 DIAGNOSIS — I10 Essential (primary) hypertension: Secondary | ICD-10-CM | POA: Diagnosis not present

## 2017-05-16 LAB — PROTIME-INR
INR: 2.39
Prothrombin Time: 25.9 seconds — ABNORMAL HIGH (ref 11.4–15.2)

## 2017-05-17 DIAGNOSIS — R293 Abnormal posture: Secondary | ICD-10-CM | POA: Diagnosis not present

## 2017-05-17 DIAGNOSIS — I69328 Other speech and language deficits following cerebral infarction: Secondary | ICD-10-CM | POA: Diagnosis not present

## 2017-05-17 DIAGNOSIS — I639 Cerebral infarction, unspecified: Secondary | ICD-10-CM | POA: Diagnosis not present

## 2017-05-17 DIAGNOSIS — M6281 Muscle weakness (generalized): Secondary | ICD-10-CM | POA: Diagnosis not present

## 2017-05-17 DIAGNOSIS — R279 Unspecified lack of coordination: Secondary | ICD-10-CM | POA: Diagnosis not present

## 2017-05-18 DIAGNOSIS — R279 Unspecified lack of coordination: Secondary | ICD-10-CM | POA: Diagnosis not present

## 2017-05-18 DIAGNOSIS — R293 Abnormal posture: Secondary | ICD-10-CM | POA: Diagnosis not present

## 2017-05-18 DIAGNOSIS — I639 Cerebral infarction, unspecified: Secondary | ICD-10-CM | POA: Diagnosis not present

## 2017-05-18 DIAGNOSIS — M6281 Muscle weakness (generalized): Secondary | ICD-10-CM | POA: Diagnosis not present

## 2017-05-18 DIAGNOSIS — I69328 Other speech and language deficits following cerebral infarction: Secondary | ICD-10-CM | POA: Diagnosis not present

## 2017-05-19 ENCOUNTER — Other Ambulatory Visit: Payer: Self-pay | Admitting: "Endocrinology

## 2017-05-19 DIAGNOSIS — R293 Abnormal posture: Secondary | ICD-10-CM | POA: Diagnosis not present

## 2017-05-19 DIAGNOSIS — I639 Cerebral infarction, unspecified: Secondary | ICD-10-CM | POA: Diagnosis not present

## 2017-05-19 DIAGNOSIS — M6281 Muscle weakness (generalized): Secondary | ICD-10-CM | POA: Diagnosis not present

## 2017-05-19 DIAGNOSIS — R279 Unspecified lack of coordination: Secondary | ICD-10-CM | POA: Diagnosis not present

## 2017-05-19 DIAGNOSIS — I69328 Other speech and language deficits following cerebral infarction: Secondary | ICD-10-CM | POA: Diagnosis not present

## 2017-05-19 MED ORDER — METHIMAZOLE 5 MG PO TABS: 5.0000 mg | ORAL_TABLET | Freq: Every day | ORAL | 2 refills | Status: DC

## 2017-05-20 DIAGNOSIS — I639 Cerebral infarction, unspecified: Secondary | ICD-10-CM | POA: Diagnosis not present

## 2017-05-20 DIAGNOSIS — M6281 Muscle weakness (generalized): Secondary | ICD-10-CM | POA: Diagnosis not present

## 2017-05-20 DIAGNOSIS — I69328 Other speech and language deficits following cerebral infarction: Secondary | ICD-10-CM | POA: Diagnosis not present

## 2017-05-20 DIAGNOSIS — R293 Abnormal posture: Secondary | ICD-10-CM | POA: Diagnosis not present

## 2017-05-20 DIAGNOSIS — R279 Unspecified lack of coordination: Secondary | ICD-10-CM | POA: Diagnosis not present

## 2017-05-21 DIAGNOSIS — R279 Unspecified lack of coordination: Secondary | ICD-10-CM | POA: Diagnosis not present

## 2017-05-21 DIAGNOSIS — I69328 Other speech and language deficits following cerebral infarction: Secondary | ICD-10-CM | POA: Diagnosis not present

## 2017-05-21 DIAGNOSIS — I639 Cerebral infarction, unspecified: Secondary | ICD-10-CM | POA: Diagnosis not present

## 2017-05-21 DIAGNOSIS — R293 Abnormal posture: Secondary | ICD-10-CM | POA: Diagnosis not present

## 2017-05-21 DIAGNOSIS — M6281 Muscle weakness (generalized): Secondary | ICD-10-CM | POA: Diagnosis not present

## 2017-05-22 DIAGNOSIS — I69328 Other speech and language deficits following cerebral infarction: Secondary | ICD-10-CM | POA: Diagnosis not present

## 2017-05-22 DIAGNOSIS — R279 Unspecified lack of coordination: Secondary | ICD-10-CM | POA: Diagnosis not present

## 2017-05-22 DIAGNOSIS — I639 Cerebral infarction, unspecified: Secondary | ICD-10-CM | POA: Diagnosis not present

## 2017-05-22 DIAGNOSIS — R293 Abnormal posture: Secondary | ICD-10-CM | POA: Diagnosis not present

## 2017-05-22 DIAGNOSIS — M6281 Muscle weakness (generalized): Secondary | ICD-10-CM | POA: Diagnosis not present

## 2017-05-23 DIAGNOSIS — I639 Cerebral infarction, unspecified: Secondary | ICD-10-CM | POA: Diagnosis not present

## 2017-05-23 DIAGNOSIS — R293 Abnormal posture: Secondary | ICD-10-CM | POA: Diagnosis not present

## 2017-05-23 DIAGNOSIS — R279 Unspecified lack of coordination: Secondary | ICD-10-CM | POA: Diagnosis not present

## 2017-05-23 DIAGNOSIS — M6281 Muscle weakness (generalized): Secondary | ICD-10-CM | POA: Diagnosis not present

## 2017-05-23 DIAGNOSIS — I69328 Other speech and language deficits following cerebral infarction: Secondary | ICD-10-CM | POA: Diagnosis not present

## 2017-05-24 DIAGNOSIS — R293 Abnormal posture: Secondary | ICD-10-CM | POA: Diagnosis not present

## 2017-05-24 DIAGNOSIS — R279 Unspecified lack of coordination: Secondary | ICD-10-CM | POA: Diagnosis not present

## 2017-05-24 DIAGNOSIS — I69328 Other speech and language deficits following cerebral infarction: Secondary | ICD-10-CM | POA: Diagnosis not present

## 2017-05-24 DIAGNOSIS — I639 Cerebral infarction, unspecified: Secondary | ICD-10-CM | POA: Diagnosis not present

## 2017-05-24 DIAGNOSIS — M6281 Muscle weakness (generalized): Secondary | ICD-10-CM | POA: Diagnosis not present

## 2017-05-25 DIAGNOSIS — R279 Unspecified lack of coordination: Secondary | ICD-10-CM | POA: Diagnosis not present

## 2017-05-25 DIAGNOSIS — I639 Cerebral infarction, unspecified: Secondary | ICD-10-CM | POA: Diagnosis not present

## 2017-05-25 DIAGNOSIS — R293 Abnormal posture: Secondary | ICD-10-CM | POA: Diagnosis not present

## 2017-05-25 DIAGNOSIS — I69328 Other speech and language deficits following cerebral infarction: Secondary | ICD-10-CM | POA: Diagnosis not present

## 2017-05-25 DIAGNOSIS — M6281 Muscle weakness (generalized): Secondary | ICD-10-CM | POA: Diagnosis not present

## 2017-05-26 DIAGNOSIS — I69328 Other speech and language deficits following cerebral infarction: Secondary | ICD-10-CM | POA: Diagnosis not present

## 2017-05-26 DIAGNOSIS — R293 Abnormal posture: Secondary | ICD-10-CM | POA: Diagnosis not present

## 2017-05-26 DIAGNOSIS — M6281 Muscle weakness (generalized): Secondary | ICD-10-CM | POA: Diagnosis not present

## 2017-05-26 DIAGNOSIS — R279 Unspecified lack of coordination: Secondary | ICD-10-CM | POA: Diagnosis not present

## 2017-05-26 DIAGNOSIS — I639 Cerebral infarction, unspecified: Secondary | ICD-10-CM | POA: Diagnosis not present

## 2017-05-27 DIAGNOSIS — M6281 Muscle weakness (generalized): Secondary | ICD-10-CM | POA: Diagnosis not present

## 2017-05-27 DIAGNOSIS — I639 Cerebral infarction, unspecified: Secondary | ICD-10-CM | POA: Diagnosis not present

## 2017-05-27 DIAGNOSIS — I69328 Other speech and language deficits following cerebral infarction: Secondary | ICD-10-CM | POA: Diagnosis not present

## 2017-05-27 DIAGNOSIS — R293 Abnormal posture: Secondary | ICD-10-CM | POA: Diagnosis not present

## 2017-05-27 DIAGNOSIS — R279 Unspecified lack of coordination: Secondary | ICD-10-CM | POA: Diagnosis not present

## 2017-05-28 DIAGNOSIS — R293 Abnormal posture: Secondary | ICD-10-CM | POA: Diagnosis not present

## 2017-05-28 DIAGNOSIS — R279 Unspecified lack of coordination: Secondary | ICD-10-CM | POA: Diagnosis not present

## 2017-05-28 DIAGNOSIS — M6281 Muscle weakness (generalized): Secondary | ICD-10-CM | POA: Diagnosis not present

## 2017-05-28 DIAGNOSIS — I69328 Other speech and language deficits following cerebral infarction: Secondary | ICD-10-CM | POA: Diagnosis not present

## 2017-05-28 DIAGNOSIS — I639 Cerebral infarction, unspecified: Secondary | ICD-10-CM | POA: Diagnosis not present

## 2017-05-30 ENCOUNTER — Encounter (HOSPITAL_COMMUNITY)
Admission: RE | Admit: 2017-05-30 | Discharge: 2017-05-30 | Disposition: A | Discharge status: Home / Self Care | Payer: Medicare Other | Source: Skilled Nursing Facility | Attending: Internal Medicine | Admitting: Internal Medicine

## 2017-05-30 DIAGNOSIS — R293 Abnormal posture: Secondary | ICD-10-CM | POA: Insufficient documentation

## 2017-05-30 DIAGNOSIS — G8191 Hemiplegia, unspecified affecting right dominant side: Secondary | ICD-10-CM | POA: Insufficient documentation

## 2017-05-30 DIAGNOSIS — I69998 Other sequelae following unspecified cerebrovascular disease: Secondary | ICD-10-CM | POA: Insufficient documentation

## 2017-05-30 DIAGNOSIS — Z7901 Long term (current) use of anticoagulants: Secondary | ICD-10-CM | POA: Diagnosis not present

## 2017-05-30 DIAGNOSIS — I10 Essential (primary) hypertension: Secondary | ICD-10-CM | POA: Insufficient documentation

## 2017-05-30 LAB — PROTIME-INR
INR: 1.72
PROTHROMBIN TIME: 20 s — AB (ref 11.4–15.2)

## 2017-05-31 DIAGNOSIS — R279 Unspecified lack of coordination: Secondary | ICD-10-CM | POA: Diagnosis not present

## 2017-05-31 DIAGNOSIS — R293 Abnormal posture: Secondary | ICD-10-CM | POA: Diagnosis not present

## 2017-05-31 DIAGNOSIS — I69328 Other speech and language deficits following cerebral infarction: Secondary | ICD-10-CM | POA: Diagnosis not present

## 2017-05-31 DIAGNOSIS — I639 Cerebral infarction, unspecified: Secondary | ICD-10-CM | POA: Diagnosis not present

## 2017-05-31 DIAGNOSIS — M6281 Muscle weakness (generalized): Secondary | ICD-10-CM | POA: Diagnosis not present

## 2017-06-01 DIAGNOSIS — R279 Unspecified lack of coordination: Secondary | ICD-10-CM | POA: Diagnosis not present

## 2017-06-01 DIAGNOSIS — R293 Abnormal posture: Secondary | ICD-10-CM | POA: Diagnosis not present

## 2017-06-01 DIAGNOSIS — I639 Cerebral infarction, unspecified: Secondary | ICD-10-CM | POA: Diagnosis not present

## 2017-06-01 DIAGNOSIS — M6281 Muscle weakness (generalized): Secondary | ICD-10-CM | POA: Diagnosis not present

## 2017-06-01 DIAGNOSIS — I69328 Other speech and language deficits following cerebral infarction: Secondary | ICD-10-CM | POA: Diagnosis not present

## 2017-06-02 DIAGNOSIS — I639 Cerebral infarction, unspecified: Secondary | ICD-10-CM | POA: Diagnosis not present

## 2017-06-02 DIAGNOSIS — M6281 Muscle weakness (generalized): Secondary | ICD-10-CM | POA: Diagnosis not present

## 2017-06-02 DIAGNOSIS — R293 Abnormal posture: Secondary | ICD-10-CM | POA: Diagnosis not present

## 2017-06-02 DIAGNOSIS — R279 Unspecified lack of coordination: Secondary | ICD-10-CM | POA: Diagnosis not present

## 2017-06-02 DIAGNOSIS — I69328 Other speech and language deficits following cerebral infarction: Secondary | ICD-10-CM | POA: Diagnosis not present

## 2017-06-03 DIAGNOSIS — R293 Abnormal posture: Secondary | ICD-10-CM | POA: Diagnosis not present

## 2017-06-03 DIAGNOSIS — I69328 Other speech and language deficits following cerebral infarction: Secondary | ICD-10-CM | POA: Diagnosis not present

## 2017-06-03 DIAGNOSIS — I639 Cerebral infarction, unspecified: Secondary | ICD-10-CM | POA: Diagnosis not present

## 2017-06-03 DIAGNOSIS — R279 Unspecified lack of coordination: Secondary | ICD-10-CM | POA: Diagnosis not present

## 2017-06-03 DIAGNOSIS — M6281 Muscle weakness (generalized): Secondary | ICD-10-CM | POA: Diagnosis not present

## 2017-06-04 DIAGNOSIS — I69328 Other speech and language deficits following cerebral infarction: Secondary | ICD-10-CM | POA: Diagnosis not present

## 2017-06-04 DIAGNOSIS — I639 Cerebral infarction, unspecified: Secondary | ICD-10-CM | POA: Diagnosis not present

## 2017-06-04 DIAGNOSIS — R279 Unspecified lack of coordination: Secondary | ICD-10-CM | POA: Diagnosis not present

## 2017-06-04 DIAGNOSIS — M6281 Muscle weakness (generalized): Secondary | ICD-10-CM | POA: Diagnosis not present

## 2017-06-04 DIAGNOSIS — R293 Abnormal posture: Secondary | ICD-10-CM | POA: Diagnosis not present

## 2017-06-06 ENCOUNTER — Encounter (HOSPITAL_COMMUNITY)
Admission: RE | Admit: 2017-06-06 | Discharge: 2017-06-06 | Disposition: A | Discharge status: Home / Self Care | Payer: Medicare Other | Source: Skilled Nursing Facility | Attending: *Deleted | Admitting: *Deleted

## 2017-06-06 DIAGNOSIS — Z7901 Long term (current) use of anticoagulants: Secondary | ICD-10-CM | POA: Diagnosis not present

## 2017-06-06 DIAGNOSIS — R279 Unspecified lack of coordination: Secondary | ICD-10-CM | POA: Diagnosis not present

## 2017-06-06 DIAGNOSIS — M6281 Muscle weakness (generalized): Secondary | ICD-10-CM | POA: Diagnosis not present

## 2017-06-06 DIAGNOSIS — I69998 Other sequelae following unspecified cerebrovascular disease: Secondary | ICD-10-CM | POA: Diagnosis not present

## 2017-06-06 DIAGNOSIS — I10 Essential (primary) hypertension: Secondary | ICD-10-CM | POA: Diagnosis not present

## 2017-06-06 DIAGNOSIS — I639 Cerebral infarction, unspecified: Secondary | ICD-10-CM | POA: Diagnosis not present

## 2017-06-06 DIAGNOSIS — G8191 Hemiplegia, unspecified affecting right dominant side: Secondary | ICD-10-CM | POA: Diagnosis not present

## 2017-06-06 DIAGNOSIS — I69328 Other speech and language deficits following cerebral infarction: Secondary | ICD-10-CM | POA: Diagnosis not present

## 2017-06-06 DIAGNOSIS — R293 Abnormal posture: Secondary | ICD-10-CM | POA: Diagnosis not present

## 2017-06-06 LAB — PROTIME-INR
INR: 1.77
PROTHROMBIN TIME: 20.4 s — AB (ref 11.4–15.2)

## 2017-06-07 DIAGNOSIS — R279 Unspecified lack of coordination: Secondary | ICD-10-CM | POA: Diagnosis not present

## 2017-06-07 DIAGNOSIS — R293 Abnormal posture: Secondary | ICD-10-CM | POA: Diagnosis not present

## 2017-06-07 DIAGNOSIS — I69328 Other speech and language deficits following cerebral infarction: Secondary | ICD-10-CM | POA: Diagnosis not present

## 2017-06-07 DIAGNOSIS — I639 Cerebral infarction, unspecified: Secondary | ICD-10-CM | POA: Diagnosis not present

## 2017-06-07 DIAGNOSIS — M6281 Muscle weakness (generalized): Secondary | ICD-10-CM | POA: Diagnosis not present

## 2017-06-08 DIAGNOSIS — R279 Unspecified lack of coordination: Secondary | ICD-10-CM | POA: Diagnosis not present

## 2017-06-08 DIAGNOSIS — I69328 Other speech and language deficits following cerebral infarction: Secondary | ICD-10-CM | POA: Diagnosis not present

## 2017-06-08 DIAGNOSIS — R293 Abnormal posture: Secondary | ICD-10-CM | POA: Diagnosis not present

## 2017-06-08 DIAGNOSIS — I639 Cerebral infarction, unspecified: Secondary | ICD-10-CM | POA: Diagnosis not present

## 2017-06-08 DIAGNOSIS — M6281 Muscle weakness (generalized): Secondary | ICD-10-CM | POA: Diagnosis not present

## 2017-06-09 DIAGNOSIS — R293 Abnormal posture: Secondary | ICD-10-CM | POA: Diagnosis not present

## 2017-06-09 DIAGNOSIS — I69328 Other speech and language deficits following cerebral infarction: Secondary | ICD-10-CM | POA: Diagnosis not present

## 2017-06-09 DIAGNOSIS — M6281 Muscle weakness (generalized): Secondary | ICD-10-CM | POA: Diagnosis not present

## 2017-06-09 DIAGNOSIS — R279 Unspecified lack of coordination: Secondary | ICD-10-CM | POA: Diagnosis not present

## 2017-06-09 DIAGNOSIS — I639 Cerebral infarction, unspecified: Secondary | ICD-10-CM | POA: Diagnosis not present

## 2017-06-10 DIAGNOSIS — R293 Abnormal posture: Secondary | ICD-10-CM | POA: Diagnosis not present

## 2017-06-10 DIAGNOSIS — I639 Cerebral infarction, unspecified: Secondary | ICD-10-CM | POA: Diagnosis not present

## 2017-06-10 DIAGNOSIS — R279 Unspecified lack of coordination: Secondary | ICD-10-CM | POA: Diagnosis not present

## 2017-06-10 DIAGNOSIS — M6281 Muscle weakness (generalized): Secondary | ICD-10-CM | POA: Diagnosis not present

## 2017-06-10 DIAGNOSIS — I69328 Other speech and language deficits following cerebral infarction: Secondary | ICD-10-CM | POA: Diagnosis not present

## 2017-06-13 ENCOUNTER — Other Ambulatory Visit (HOSPITAL_COMMUNITY)
Admission: RE | Admit: 2017-06-13 | Discharge: 2017-06-13 | Disposition: A | Discharge status: Home / Self Care | Payer: Medicare Other | Source: Skilled Nursing Facility | Attending: Internal Medicine | Admitting: Internal Medicine

## 2017-06-13 DIAGNOSIS — I10 Essential (primary) hypertension: Secondary | ICD-10-CM | POA: Insufficient documentation

## 2017-06-13 DIAGNOSIS — R1312 Dysphagia, oropharyngeal phase: Secondary | ICD-10-CM | POA: Insufficient documentation

## 2017-06-13 DIAGNOSIS — I69328 Other speech and language deficits following cerebral infarction: Secondary | ICD-10-CM | POA: Diagnosis not present

## 2017-06-13 DIAGNOSIS — K562 Volvulus: Secondary | ICD-10-CM | POA: Diagnosis not present

## 2017-06-13 DIAGNOSIS — R293 Abnormal posture: Secondary | ICD-10-CM | POA: Insufficient documentation

## 2017-06-13 LAB — PROTIME-INR
INR: 1.85
Prothrombin Time: 21.2 seconds — ABNORMAL HIGH (ref 11.4–15.2)

## 2017-06-15 ENCOUNTER — Encounter (HOSPITAL_COMMUNITY)
Admission: RE | Admit: 2017-06-15 | Discharge: 2017-06-15 | Disposition: A | Discharge status: Home / Self Care | Payer: Medicare Other | Source: Skilled Nursing Facility | Attending: *Deleted | Admitting: *Deleted

## 2017-06-15 DIAGNOSIS — I10 Essential (primary) hypertension: Secondary | ICD-10-CM | POA: Diagnosis not present

## 2017-06-15 DIAGNOSIS — I69998 Other sequelae following unspecified cerebrovascular disease: Secondary | ICD-10-CM | POA: Diagnosis not present

## 2017-06-15 DIAGNOSIS — R293 Abnormal posture: Secondary | ICD-10-CM | POA: Diagnosis not present

## 2017-06-15 DIAGNOSIS — G8191 Hemiplegia, unspecified affecting right dominant side: Secondary | ICD-10-CM | POA: Diagnosis not present

## 2017-06-15 DIAGNOSIS — Z7901 Long term (current) use of anticoagulants: Secondary | ICD-10-CM | POA: Diagnosis not present

## 2017-06-15 LAB — TSH: TSH: 0 u[IU]/mL — AB (ref 0.350–4.500)

## 2017-06-15 LAB — T4, FREE: FREE T4: 1.06 ng/dL (ref 0.61–1.12)

## 2017-06-16 ENCOUNTER — Encounter (HOSPITAL_COMMUNITY)
Admission: RE | Admit: 2017-06-16 | Discharge: 2017-06-16 | Disposition: A | Discharge status: Home / Self Care | Payer: Medicare Other | Source: Skilled Nursing Facility | Attending: *Deleted | Admitting: *Deleted

## 2017-06-16 DIAGNOSIS — I10 Essential (primary) hypertension: Secondary | ICD-10-CM | POA: Diagnosis not present

## 2017-06-16 DIAGNOSIS — G8191 Hemiplegia, unspecified affecting right dominant side: Secondary | ICD-10-CM | POA: Diagnosis not present

## 2017-06-16 DIAGNOSIS — R293 Abnormal posture: Secondary | ICD-10-CM | POA: Diagnosis not present

## 2017-06-16 DIAGNOSIS — Z7901 Long term (current) use of anticoagulants: Secondary | ICD-10-CM | POA: Diagnosis not present

## 2017-06-16 DIAGNOSIS — I69998 Other sequelae following unspecified cerebrovascular disease: Secondary | ICD-10-CM | POA: Diagnosis not present

## 2017-06-16 LAB — PROTIME-INR
INR: 1.8
Prothrombin Time: 20.7 seconds — ABNORMAL HIGH (ref 11.4–15.2)

## 2017-06-20 ENCOUNTER — Encounter (HOSPITAL_COMMUNITY)
Admission: RE | Admit: 2017-06-20 | Discharge: 2017-06-20 | Disposition: A | Discharge status: Home / Self Care | Payer: Medicare Other | Source: Skilled Nursing Facility | Attending: *Deleted | Admitting: *Deleted

## 2017-06-20 DIAGNOSIS — Z7901 Long term (current) use of anticoagulants: Secondary | ICD-10-CM | POA: Diagnosis not present

## 2017-06-20 DIAGNOSIS — G8191 Hemiplegia, unspecified affecting right dominant side: Secondary | ICD-10-CM | POA: Diagnosis not present

## 2017-06-20 DIAGNOSIS — R293 Abnormal posture: Secondary | ICD-10-CM | POA: Diagnosis not present

## 2017-06-20 DIAGNOSIS — I69998 Other sequelae following unspecified cerebrovascular disease: Secondary | ICD-10-CM | POA: Diagnosis not present

## 2017-06-20 DIAGNOSIS — I10 Essential (primary) hypertension: Secondary | ICD-10-CM | POA: Diagnosis not present

## 2017-06-20 LAB — PROTIME-INR
INR: 1.75
PROTHROMBIN TIME: 20.3 s — AB (ref 11.4–15.2)

## 2017-06-22 ENCOUNTER — Ambulatory Visit: Payer: Medicare Other | Admitting: "Endocrinology

## 2017-06-22 ENCOUNTER — Encounter: Payer: Self-pay | Admitting: "Endocrinology

## 2017-06-22 ENCOUNTER — Ambulatory Visit (INDEPENDENT_AMBULATORY_CARE_PROVIDER_SITE_OTHER): Payer: Medicare Other | Admitting: "Endocrinology

## 2017-06-22 VITALS — BP 161/98 | HR 72 | Ht 70.0 in | Wt 185.0 lb

## 2017-06-22 DIAGNOSIS — E0590 Thyrotoxicosis, unspecified without thyrotoxic crisis or storm: Secondary | ICD-10-CM

## 2017-06-22 NOTE — Progress Notes (Signed)
Subjective:    Patient ID: [name] [name], male    DOB: 04/20/1946, PCP [name] [name], [name] [name], [name] [name]   Past Medical History:  Diagnosis Date  . Dysphasia   . Fatty liver   . Hemiplegia (HCC)   . Hypertension   . Pneumonia   . Pulmonary embolism (HCC)   . 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(HCC)   . Stroke [name] [name](HCC)    Past Surgical History:  Procedure Laterality Date  . FLEXIBLE SIGMOIDOSCOPY N/A 10/21/2016   Procedure: FLEXIBLE SIGMOIDOSCOPY;  Surgeon: [name] [name], [name] [name], [name] [name];  [facility];  Service: Endoscopy;  Laterality: N/A;  . FLEXIBLE SIGMOIDOSCOPY N/A 10/25/2016   Procedure: FLEXIBLE SIGMOIDOSCOPY;  Surgeon: [name] [name], [name] [name], [name] [name];  [facility];  Service: Endoscopy;  Laterality: N/A;  . unable     Social History   Socioeconomic History  . Marital status: Single    Spouse name: None  . Number of children: None  . Years of education: None  . Highest education level: None  Social Needs  . Financial resource strain: None  . Food insecurity - worry: None  . Food insecurity - inability: None  . Transportation needs - medical: None  . Transportation needs - non-medical: None  Occupational History  . None  Tobacco Use  . Smoking status: Never Smoker  . Smokeless tobacco: Never Used  Substance and Sexual Activity  . Alcohol use: No    Alcohol/week: 0.0 oz  . Drug use: No  . Sexual activity: None  Other Topics Concern  . None  Social History Narrative  . None   Outpatient Encounter Medications as of 06/22/2017  Medication Sig  . acetaminophen (TYLENOL) 325 MG tablet Take 650 mg by mouth every 6 (six) hours as needed for moderate pain.   Marland Kitchen. betamethasone dipropionate (DIPROLENE) 0.05 % cream Apply to legs and hands. Do not apply to face groin, axilla. Try Cerave first twice a day.  . budesonide-formoterol (SYMBICORT) 80-4.5 MCG/ACT inhaler Inhale 2 puffs into the lungs 2 (two) times daily.  . carboxymethylcellulose (REFRESH PLUS) 0.5 % SOLN Place 1 drop into both eyes 2  (two) times daily.  . Emollient (CERAVE) CREA Apply 1 application topically daily as needed for itching once a day  . Eyelid Cleansers (SYSTANE LID WIPES EX) Apply 1 application topically 2 (two) times daily. Start occusoft lid scrubs: clean eyelids of oil and debris BID OU ( each eye ) before administering eye drops.   Marland Kitchen. ipratropium (ATROVENT) 0.02 % nebulizer solution Take 0.5 mg by nebulization every 6 (six) hours.  Marland Kitchen. labetalol (NORMODYNE) 200 MG tablet Take 200 mg by mouth daily. For HTN  . loratadine (CLARITIN) 10 MG tablet Take 10 mg by mouth daily.  . methimazole (TAPAZOLE) 5 MG tablet Take 1 tablet (5 mg total) by mouth daily.  . polyethylene glycol (MIRALAX / GLYCOLAX) packet Take 17 g by mouth at bedtime.  . potassium chloride (K-DUR,KLOR-CON) 10 MEQ tablet Take 40 mEq by mouth daily.   . sennosides-docusate sodium (SENOKOT-S) 8.6-50 MG tablet Take 1 tablet by mouth at bedtime. For constipation  . tiotropium (SPIRIVA HANDIHALER) 18 MCG inhalation capsule Place 18 mcg into inhaler and inhale daily.  Marland Kitchen. warfarin (COUMADIN) 2.5 MG tablet Take 2.5 mg by mouth daily.  [name] [name]. White Petrolatum-Mineral Oil (SYSTANE NIGHTTIME) OINT Place 1 application into the left eye at bedtime. Apply to left eye at night    No facility-administered encounter medications on file as of 06/22/2017.    ALLERGIES:  Allergies  Allergen Reactions  . Penicillins     Has patient had a PCN reaction causing immediate rash, facial/tongue/throat swelling, SOB or lightheadedness with hypotension: unknown Has patient had a PCN reaction causing severe rash involving mucus membranes or skin necrosis: unknown Has patient had a PCN reaction that required hospitalization: unknown Has patient had a PCN reaction occurring within the last 10 years: unknown If all of the above answers are "NO", then may proceed with Cephalosporin use. 5/3 Tolerated Cefepime x 1 dose in ED.     VACCINATION STATUS: Immunization History  Administered  Date(s) Administered  . Influenza-Unspecified 03/28/2014, 03/23/2016, 03/25/2017  . Pneumococcal Conjugate-13 04/21/2015  . Pneumococcal-Unspecified 11/01/2009, 03/30/2016  . Tdap 03/17/2017    HPI [name] [name] is 72 y.o. male who presents today with a medical history as above. - He was initiated on low-dose methimazole 5 mg by mouth daily for hyperthyroidism. Due to logistical problems from nursing home, he was not treated with radioactive iodine therapy as planned. -  He has multiple medical problems including stroke with right-sided hemiplegia, nursing home resident. - He also has dysphagia, was found to be losing weight, currently reversing [name] [name] he has gained 4 pounds since last visit- a good development for him.  - He was sent for thyroid uptake and scan which showed nonfocal elevated uptake of 42%.  He is unable to provide history, accompanied by his brother not aware of the details of his medical problems. he has been dealing with symptoms of  loss of weight, sleep disturbance. 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   Review of Systems  Constitutional: + weight gain + fatigue, no subjective hyperthermia, no subjective hypothermia, + wheelchair-bound.  Eyes: no blurry vision, no xerophthalmia ENT: no sore throat, no nodules palpated in throat, no dysphagia/odynophagia, no hoarseness Cardiovascular: no Chest Pain, no Shortness of Breath, no palpitations, no leg swelling Respiratory: no cough, no SOB Gastrointestinal: no Nausea/Vomiting/Diarhhea Musculoskeletal: + Wheelchair-bound ,  stiff muscles on extremities.  Skin: no rashes Neurological: no tremors, no numbness, no tingling, no dizziness Psychiatric: + depression, no anxiety  Objective:    BP (!) 161/98   Pulse 72   Ht 5\' 10"  (1.778 m)   Wt 185 lb (83.9 kg)   BMI 26.54 kg/m   Wt Readings from Last 3 Encounters:   06/22/17 185 lb (83.9 kg)  04/29/17 179 lb 12.8 oz (81.6 kg)  04/07/17 181 lb 12.8 oz (82.5 kg)    Physical Exam  Constitutional:  not in acute distress,  + wheelchair-bound, with stiff and spastic right upper and lower extremities Eyes: PERRLA, EOMI, no exophthalmos, + drooling ENT: moist mucous membranes, no thyromegaly, no cervical lymphadenopathy Cardiovascular: normal precordial activity,  tachycardia has resolved to pulse rate of 72 ,  no Murmur/Rubs/Gallops Respiratory:  adequate breathing efforts, no gross chest deformity, Clear to auscultation bilaterally Gastrointestinal: abdomen soft, Non -tender, No distension, Bowel Sounds present Musculoskeletal: + + Wheelchair-bound, with stiff and spastic right upper and lower extremities Skin: moist, warm, no rashes Neurological: No tremors on outstretched hands,  Deep tendon reflexes normal in all four extremities.  CMP ( most recent) CMP     Component Value Date/Time   NA 137 04/06/2017 0701   NA 142 09/12/2015   K 4.1 04/06/2017 0701   CL 100 ([name]) 04/06/2017 0701   CO2 28 04/06/2017 0701   GLUCOSE 93 04/06/2017 0701  BUN 21 (H) 04/06/2017 0701   BUN 18 09/12/2015   CREATININE 1.03 04/06/2017 0701   CALCIUM 8.8 ([name]) 04/06/2017 0701   PROT 7.0 12/21/2016 0500   ALBUMIN 3.2 ([name]) 12/21/2016 0500   AST 23 12/21/2016 0500   ALT 29 12/21/2016 0500   ALKPHOS 58 12/21/2016 0500   BILITOT 0.4 12/21/2016 0500   GFRNONAA >60 04/06/2017 0701   GFRAA >60 04/06/2017 0701     Diabetic Labs (most recent): Lab Results  Component Value Date   HGBA1C  03/27/2009    6.0 (NOTE) The ADA recommends the following therapeutic goal for glycemic control related to Hgb A1c measurement: Goal of therapy: <6.5 Hgb A1c  Reference: American Diabetes Association: Clinical Practice Recommendations 2010, Diabetes Care, 2010, 33: (Suppl  1).   HGBA1C 6 06/22/2008     Lipid Panel ( most recent) Lipid Panel     Component Value Date/Time   CHOL 106  03/09/2017 0712   TRIG 42 03/09/2017 0712   HDL 31 ([name]) 03/09/2017 0712   CHOLHDL 3.4 03/09/2017 0712   VLDL 8 03/09/2017 0712   LDLCALC 67 03/09/2017 0712    Results for SMOKEY, MELOTT (MRN [id number]) as of 06/22/2017 09:28  Ref. Range 03/09/2017 07:11 03/09/2017 07:12 06/15/2017 09:44  TSH Latest Ref Range: 0.350 - 4.500 uIU/mL  <0.010 ([name]) 0.000 ([name])  Triiodothyronine (T3) Latest Ref Range: 71 - 180 ng/dL 409    W1,XBJY(NWGNFA) Latest Ref Range: 0.61 - 1.12 ng/dL   2.13  Thyroxine (T4) Latest Ref Range: 4.5 - 12.0 ug/dL 08.6      Thyroid uptake and scan showed nonfocal, elevated uptake of 42%   Assessment & Plan:   1.  Hyperthyroidism 2. Tachycardia  - His thyroid uptake and scan is consistent with hyperthyroidism, nonfocal elevated uptake of 42%. Due to some logistical problems from nursing home, he was not given radioactive iodine therapy, however he is responding to treatment with methimazole. - I advised him to continue methimazole 5 mg by mouth daily with plan to repeat thyroid function test in 3 months. Free T4 is acceptable at 1.06, TSH still lagging. No need to make adjustment of the dose of methimazole for now. - Regarding tachycardia: He is already on a beta blocker, labetalol 200 mg daily, pulse rate is controlled at 72.  - I advised patient to maintain close follow up with [name] Gammon, [name] [name] for primary care needs. Follow up plan: Return in about 3 months (around 09/20/2017) for follow up with pre-visit labs.  Marquis Lunch, [name] [name] [facility] Phone: [phone]  Fax: [phone]   06/22/2017, 10:12 AM This note was partially dictated with voice recognition software. Similar sounding words can be transcribed inadequately or may not  be corrected upon review.

## 2017-06-23 ENCOUNTER — Encounter (HOSPITAL_COMMUNITY)
Admission: RE | Admit: 2017-06-23 | Discharge: 2017-06-23 | Disposition: A | Discharge status: Home / Self Care | Payer: Medicare Other | Source: Skilled Nursing Facility | Attending: Internal Medicine | Admitting: Internal Medicine

## 2017-06-23 DIAGNOSIS — R1312 Dysphagia, oropharyngeal phase: Secondary | ICD-10-CM | POA: Insufficient documentation

## 2017-06-23 DIAGNOSIS — R293 Abnormal posture: Secondary | ICD-10-CM | POA: Diagnosis not present

## 2017-06-23 DIAGNOSIS — R279 Unspecified lack of coordination: Secondary | ICD-10-CM | POA: Insufficient documentation

## 2017-06-23 DIAGNOSIS — M6281 Muscle weakness (generalized): Secondary | ICD-10-CM | POA: Insufficient documentation

## 2017-06-23 DIAGNOSIS — I10 Essential (primary) hypertension: Secondary | ICD-10-CM | POA: Insufficient documentation

## 2017-06-23 DIAGNOSIS — I69328 Other speech and language deficits following cerebral infarction: Secondary | ICD-10-CM | POA: Insufficient documentation

## 2017-06-23 LAB — PROTIME-INR
INR: 2.09
Prothrombin Time: 23.3 seconds — ABNORMAL HIGH (ref 11.4–15.2)

## 2017-06-27 DIAGNOSIS — H179 Unspecified corneal scar and opacity: Secondary | ICD-10-CM | POA: Diagnosis not present

## 2017-06-28 ENCOUNTER — Encounter (HOSPITAL_COMMUNITY)
Admission: RE | Admit: 2017-06-28 | Discharge: 2017-06-28 | Disposition: A | Discharge status: Home / Self Care | Payer: Medicare Other | Source: Skilled Nursing Facility | Attending: Pediatrics | Admitting: Pediatrics

## 2017-06-28 DIAGNOSIS — I69328 Other speech and language deficits following cerebral infarction: Secondary | ICD-10-CM | POA: Insufficient documentation

## 2017-06-28 DIAGNOSIS — R293 Abnormal posture: Secondary | ICD-10-CM | POA: Diagnosis not present

## 2017-06-28 DIAGNOSIS — M6281 Muscle weakness (generalized): Secondary | ICD-10-CM | POA: Diagnosis not present

## 2017-06-28 DIAGNOSIS — R279 Unspecified lack of coordination: Secondary | ICD-10-CM | POA: Insufficient documentation

## 2017-06-28 DIAGNOSIS — R1312 Dysphagia, oropharyngeal phase: Secondary | ICD-10-CM | POA: Insufficient documentation

## 2017-06-28 DIAGNOSIS — I10 Essential (primary) hypertension: Secondary | ICD-10-CM | POA: Diagnosis not present

## 2017-06-28 LAB — PROTIME-INR
INR: 2.28
Prothrombin Time: 24.9 seconds — ABNORMAL HIGH (ref 11.4–15.2)

## 2017-06-30 DIAGNOSIS — I739 Peripheral vascular disease, unspecified: Secondary | ICD-10-CM | POA: Diagnosis not present

## 2017-06-30 DIAGNOSIS — B351 Tinea unguium: Secondary | ICD-10-CM | POA: Diagnosis not present

## 2017-06-30 DIAGNOSIS — R262 Difficulty in walking, not elsewhere classified: Secondary | ICD-10-CM | POA: Diagnosis not present

## 2017-07-05 ENCOUNTER — Encounter (HOSPITAL_COMMUNITY)
Admission: RE | Admit: 2017-07-05 | Discharge: 2017-07-05 | Disposition: A | Discharge status: Home / Self Care | Payer: Medicare Other | Source: Skilled Nursing Facility | Attending: *Deleted | Admitting: *Deleted

## 2017-07-05 DIAGNOSIS — R1312 Dysphagia, oropharyngeal phase: Secondary | ICD-10-CM | POA: Diagnosis not present

## 2017-07-05 DIAGNOSIS — I69328 Other speech and language deficits following cerebral infarction: Secondary | ICD-10-CM | POA: Diagnosis not present

## 2017-07-05 DIAGNOSIS — M6281 Muscle weakness (generalized): Secondary | ICD-10-CM | POA: Diagnosis not present

## 2017-07-05 DIAGNOSIS — R279 Unspecified lack of coordination: Secondary | ICD-10-CM | POA: Diagnosis not present

## 2017-07-05 DIAGNOSIS — R293 Abnormal posture: Secondary | ICD-10-CM | POA: Diagnosis not present

## 2017-07-05 DIAGNOSIS — I10 Essential (primary) hypertension: Secondary | ICD-10-CM | POA: Diagnosis not present

## 2017-07-05 LAB — PROTIME-INR
INR: 1.92
Prothrombin Time: 21.8 seconds — ABNORMAL HIGH (ref 11.4–15.2)

## 2017-07-07 ENCOUNTER — Encounter: Payer: Self-pay | Admitting: Internal Medicine

## 2017-07-07 ENCOUNTER — Non-Acute Institutional Stay (SKILLED_NURSING_FACILITY): Payer: Medicare Other | Admitting: Internal Medicine

## 2017-07-07 DIAGNOSIS — I639 Cerebral infarction, unspecified: Secondary | ICD-10-CM

## 2017-07-07 DIAGNOSIS — I2782 Chronic pulmonary embolism: Secondary | ICD-10-CM

## 2017-07-07 DIAGNOSIS — J410 Simple chronic bronchitis: Secondary | ICD-10-CM | POA: Diagnosis not present

## 2017-07-07 DIAGNOSIS — K562 Volvulus: Secondary | ICD-10-CM | POA: Diagnosis not present

## 2017-07-07 DIAGNOSIS — I10 Essential (primary) hypertension: Secondary | ICD-10-CM | POA: Diagnosis not present

## 2017-07-07 NOTE — Progress Notes (Signed)
Location:   [facility] Room Number: 112/W Place of Service:  SNF [phone]) Provider:  Pollyann Savoy, MD  [name], [name]  Extended Emergency Contact Information Primary Emergency Contact: [name] Address: [address]          [place], [place] [address] [place] Home Phone: [phone] Mobile Phone: [phone] Relation: Brother  Code Status:  Full Code Goals of care: Advanced Directive information Advanced Directives 07/07/2017  Does Patient Have a Medical Advance Directive? Yes  Type of Advance Directive (No Data)  Does patient want to make changes to medical advance directive? No - Patient declined  Copy of Healthcare Power of Attorney in Chart? No - copy requested  Would patient like information on creating a medical advance directive? No - Patient declined     Chief Complaint  Patient presents with  . Medical Management of Chronic Issues    Routine Visit    HPI:  Pt is a 72 y.o. male seen today for medical management of chronic diseases.    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  Patient is doing well in facility. He did develop Corneal Abrasion and is getting follow up with Ophthalmology. He also follows with Dr Fransico Him for Hyperthyroidism and is now on Methimazole. He is stable for his sigmoid Volvulus . He is moving his bowels well. Eating well. His weight is b/w 180-184 lbs. And stable. Patient did not have any complains and Nurses did not have any New issues.    Past Medical History:  Diagnosis Date  . Dysphasia   . Fatty liver   . Hemiplegia (HCC)   . Hypertension   . Pneumonia   . Pulmonary embolism (HCC)   . 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(HCC)   . Stroke [facility])    Past  Surgical History:  Procedure Laterality Date  . FLEXIBLE SIGMOIDOSCOPY N/A 10/21/2016   Procedure: FLEXIBLE SIGMOIDOSCOPY;  Surgeon: Malissa Hippo, MD;  [facility];  Service: Endoscopy;  Laterality: N/A;  . FLEXIBLE SIGMOIDOSCOPY N/A 10/25/2016   Procedure: FLEXIBLE SIGMOIDOSCOPY;  Surgeon: Malissa Hippo, MD;  [facility];  Service: Endoscopy;  Laterality: N/A;  . unable      Allergies  Allergen Reactions  . Penicillins     Has patient had a PCN reaction causing immediate rash, facial/tongue/throat swelling, SOB or lightheadedness with hypotension: unknown Has patient had a PCN reaction causing severe rash involving mucus membranes or skin necrosis: unknown Has patient had a PCN reaction that required hospitalization: unknown Has patient had a PCN reaction occurring within the last 10 years: unknown If all of the above answers are "NO", then may proceed with Cephalosporin use. 5/3 Tolerated Cefepime x 1 dose in ED.     Outpatient Encounter Medications as of 07/07/2017  Medication Sig  . acetaminophen (TYLENOL) 325 MG tablet Take 650 mg by mouth every 6 (six) hours as needed for moderate pain.   Marland Kitchen betamethasone dipropionate (DIPROLENE) 0.05 % cream Apply to legs and hands. Do not apply to face groin, axilla. Try Cerave first twice a day.  . budesonide-formoterol (SYMBICORT) 80-4.5 MCG/ACT inhaler Inhale 2 puffs into the lungs 2 (two) times daily.  . carboxymethylcellulose (REFRESH PLUS) 0.5 % SOLN Place 1 drop into both eyes 2 (  two) times daily.  . Emollient (CERAVE) CREA Apply 1 application topically daily as needed for itching once a day  . erythromycin ophthalmic ointment appy 1 ribbon ophthalmic (eye) 5 times a day  . Eyelid Cleansers (SYSTANE LID WIPES EX) Apply 1 application topically 2 (two) times daily. Start occusoft lid scrubs: clean eyelids of oil and debris BID OU ( each eye ) before administering eye drops.   Marland Kitchen ipratropium (ATROVENT) 0.02 %  nebulizer solution Take 0.5 mg by nebulization every 6 (six) hours.  Marland Kitchen ketotifen (ZADITOR) 0.025 % ophthalmic solution Place 1 drop into the left eye 3 (three) times daily.  Marland Kitchen labetalol (NORMODYNE) 200 MG tablet Take 200 mg by mouth daily. For HTN  . loratadine (CLARITIN) 10 MG tablet Take 10 mg by mouth daily.  . methimazole (TAPAZOLE) 5 MG tablet Take 1 tablet (5 mg total) by mouth daily.  . polyethylene glycol (MIRALAX / GLYCOLAX) packet Take 17 g by mouth at bedtime.  . potassium chloride (K-DUR,KLOR-CON) 10 MEQ tablet Take 40 mEq by mouth daily.   . sennosides-docusate sodium (SENOKOT-S) 8.6-50 MG tablet Take 1 tablet by mouth at bedtime. For constipation  . tiotropium (SPIRIVA HANDIHALER) 18 MCG inhalation capsule Place 18 mcg into inhaler and inhale daily.  Marland Kitchen warfarin (COUMADIN) 1 MG tablet Take 1/2 of ( 1 mg ) tablet by mouth along with 3 mg to = 3.5 mg once a day  . warfarin (COUMADIN) 3 MG tablet Take 3 mg tablet by mouth along with 1/2 tablet of (1 mg) to = 3.5 mg once a day  . [DISCONTINUED] warfarin (COUMADIN) 2.5 MG tablet Take 2.5 mg by mouth daily.  . [DISCONTINUED] White Petrolatum-Mineral Oil (SYSTANE NIGHTTIME) OINT Place 1 application into the left eye at bedtime. Apply to left eye at night    No facility-administered encounter medications on file as of 07/07/2017.      Review of Systems  Review of Systems  Constitutional: Negative for activity change, appetite change, chills, diaphoresis, fatigue and fever.  HENT: Negative for mouth sores, postnasal drip, rhinorrhea, sinus pain and sore throat.   Respiratory: Negative for apnea, cough, chest tightness, shortness of breath and wheezing.   Cardiovascular: Negative for chest pain, palpitations and leg swelling.  Gastrointestinal: Negative for abdominal distention, abdominal pain, constipation, diarrhea, nausea and vomiting.  Genitourinary: Negative for dysuria and frequency.  Musculoskeletal: Negative for arthralgias, joint  swelling and myalgias.  Skin: Negative for rash.  Neurological: Negative for dizziness, syncope, weakness, light-headedness and numbness.  Psychiatric/Behavioral: Negative for behavioral problems, confusion and sleep disturbance.     Immunization History  Administered Date(s) Administered  . Influenza-Unspecified 03/28/2014, 03/23/2016, 03/25/2017  . Pneumococcal Conjugate-13 04/21/2015  . Pneumococcal-Unspecified 11/01/2009, 03/30/2016  . Tdap 03/17/2017   Pertinent  Health Maintenance Due  Topic Date Due  . COLONOSCOPY  10/30/2025 (Originally 11/08/1995)  . INFLUENZA VACCINE  Completed  . PNA vac Low Risk Adult  Completed   Fall Risk  04/05/2017 03/17/2017  Falls in the past year? No No  Risk for fall due to : Impaired balance/gait;Impaired mobility;Medication side effect;Impaired vision;History of fall(s) -   Functional Status Survey:    Vitals:   07/07/17 0928  BP: 131/77  Pulse: 91  Resp: 18  Temp: (!) 97.5 F (36.4 C)  TempSrc: Oral  SpO2: 93%  Weight: 180 lb 12.8 oz (82 kg)  Height: 5' 8.5" (1.74 m)   Body mass index is 27.09 kg/m. Physical Exam  Constitutional: He appears well-developed and well-nourished.  HENT:  Head: Normocephalic.  Mouth/Throat: Oropharynx is clear and moist.  Eyes:  Continues to have redness in Left Eye. No Photophobia or Pain.  Neck: Neck supple.  Cardiovascular: Normal rate and normal heart sounds. An irregular rhythm present.  Pulmonary/Chest: Effort normal and breath sounds normal. No respiratory distress. He has no wheezes. He has no rales.  Abdominal: Soft. Bowel sounds are normal. He exhibits no distension. There is no tenderness. There is no rebound.  Musculoskeletal:  Chronic Venous Changes Bilateral  Neurological: He is alert.  Skin: Skin is warm and dry.  Psychiatric: He has a normal mood and affect. His behavior is normal. Thought content normal.    Labs reviewed: Recent Labs    10/29/16 0412  11/01/16 0700   11/04/16 0438  01/24/17 1045 03/09/17 0711 04/06/17 0701  NA 147*   < > 146*   < > 143   < > 140 139 137  K 2.6*   < > 2.8*   < > 2.9*   < > 4.9 4.0 4.1  CL 115*   < > 110   < > 107   < > 106 105 100*  CO2 28   < > 29   < > 31   < > 25 26 28   GLUCOSE 96   < > 82   < > 90   < > 89 84 93  BUN 9   < > 14   < > 12   < > 25* 24* 21*  CREATININE 0.89   < > 0.85   < > 0.86   < > 1.12 0.99 1.03  CALCIUM 7.9*   < > 8.1*   < > 7.9*   < > 9.1 8.7* 8.8*  MG 1.6*  --  1.8  --  1.6*  --   --   --   --    < > = values in this interval not displayed.   Recent Labs    10/21/16 0914 11/03/16 0455 12/21/16 0500  AST 38 26 23  ALT 45 42 29  ALKPHOS 59 51 58  BILITOT 0.7 0.4 0.4  PROT 8.2* 6.4* 7.0  ALBUMIN 3.5 2.9* 3.2*   Recent Labs    11/24/16 0718 12/21/16 0500 01/06/17 0715 03/09/17 0711  WBC 8.6 7.8  --  7.7  NEUTROABS 5.0 4.2  --  4.5  HGB 14.4 13.9  --  15.1  HCT 44.3 44.1 44.5 46.2  MCV 92.3 93.4  --  91.5  PLT 308 280  --  269   Lab Results  Component Value Date   TSH 0.000 (L) 06/15/2017   Lab Results  Component Value Date   HGBA1C  03/27/2009    6.0 (NOTE) The ADA recommends the following therapeutic goal for glycemic control related to Hgb A1c measurement: Goal of therapy: <6.5 Hgb A1c  Reference: American Diabetes Association: Clinical Practice Recommendations 2010, Diabetes Care, 2010, 33: (Suppl  1).   Lab Results  Component Value Date   CHOL 106 03/09/2017   HDL 31 (L) 03/09/2017   LDLCALC 67 03/09/2017   TRIG 42 03/09/2017   CHOLHDL 3.4 03/09/2017    Significant Diagnostic Results in last 30 days:  No results found.  Assessment/Plan COPD Patient on Spiriva and Symbicort and has been stable His Cough is resolved and No wheezing.  Essential hypertension Lisinopril was discontinued due to low BP.  His BP is now controlled on Labetalol.  Corneal Abrasion On Erythromycin eye drops And  follow up with Opthamology  Recurrent PE and chronic DVT On  Coumadin INR is 1.92 His dose was adjusted. Follow INR  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resolved. Continue on Miralax  Subclinical hyperthyroidism Follow Up With Endocrinology On Tapazole  Hypokalemia On Potassium supplement  Aggressive treatment  His H/o Volvulus. Repeat Labs  S/P CVA Patient stable. BP controlled. He is now on Dyphagic Diet and doing well. LDL was 67 in 09/18.  No ton Any statin. Repeat Lipid Profile     Family/ staff Communication:   Labs/tests ordered:

## 2017-07-11 DIAGNOSIS — H179 Unspecified corneal scar and opacity: Secondary | ICD-10-CM | POA: Diagnosis not present

## 2017-07-12 ENCOUNTER — Non-Acute Institutional Stay (SKILLED_NURSING_FACILITY): Payer: Medicare Other | Admitting: Internal Medicine

## 2017-07-12 ENCOUNTER — Encounter: Payer: Self-pay | Admitting: Internal Medicine

## 2017-07-12 ENCOUNTER — Encounter (HOSPITAL_COMMUNITY)
Admission: RE | Admit: 2017-07-12 | Discharge: 2017-07-12 | Disposition: A | Discharge status: Home / Self Care | Payer: Medicare Other | Source: Skilled Nursing Facility | Attending: Internal Medicine | Admitting: Internal Medicine

## 2017-07-12 DIAGNOSIS — R279 Unspecified lack of coordination: Secondary | ICD-10-CM | POA: Diagnosis not present

## 2017-07-12 DIAGNOSIS — I2782 Chronic pulmonary embolism: Secondary | ICD-10-CM | POA: Diagnosis not present

## 2017-07-12 DIAGNOSIS — R293 Abnormal posture: Secondary | ICD-10-CM | POA: Diagnosis not present

## 2017-07-12 DIAGNOSIS — M6281 Muscle weakness (generalized): Secondary | ICD-10-CM | POA: Diagnosis not present

## 2017-07-12 DIAGNOSIS — I10 Essential (primary) hypertension: Secondary | ICD-10-CM | POA: Insufficient documentation

## 2017-07-12 DIAGNOSIS — Z7901 Long term (current) use of anticoagulants: Secondary | ICD-10-CM | POA: Insufficient documentation

## 2017-07-12 DIAGNOSIS — I69328 Other speech and language deficits following cerebral infarction: Secondary | ICD-10-CM | POA: Diagnosis not present

## 2017-07-12 DIAGNOSIS — S0502XS Injury of conjunctiva and corneal abrasion without foreign body, left eye, sequela: Secondary | ICD-10-CM

## 2017-07-12 DIAGNOSIS — R1312 Dysphagia, oropharyngeal phase: Secondary | ICD-10-CM | POA: Diagnosis not present

## 2017-07-12 DIAGNOSIS — I639 Cerebral infarction, unspecified: Secondary | ICD-10-CM | POA: Diagnosis not present

## 2017-07-12 DIAGNOSIS — I824Y9 Acute embolism and thrombosis of unspecified deep veins of unspecified proximal lower extremity: Secondary | ICD-10-CM | POA: Diagnosis not present

## 2017-07-12 LAB — PROTIME-INR
INR: 2.17
PROTHROMBIN TIME: 24 s — AB (ref 11.4–15.2)

## 2017-07-12 NOTE — Progress Notes (Signed)
Location:   [facility] Room Number: 112/W Place of Service:  SNF [phone]) Provider:  Pollyann SavoyAnjali, Gupta  Gupta, Anjali L, MD  Patient Care Team: Mahlon GammonGupta, Anjali L, [name]  Extended Emergency Contact Information Primary Emergency Contact: Venetia MaxonWalker,[name] Jr Address: [address]          [place], [place] [address] [place] Home Phone: [phone] Mobile Phone: [phone] Relation: Brother  Code Status:  Full Code Goals of care: Advanced Directive information Advanced Directives 07/12/2017  Does Patient Have a Medical Advance Directive? Yes  Type of Advance Directive (No Data)  Does patient want to make changes to medical advance directive? No - Patient declined  Copy of Healthcare Power of Attorney in Chart? No - copy requested  Would patient like information on creating a medical advance directive? No - Patient declined     Chief Complaint  Patient presents with  . Acute Visit    Acute Visit    HPI:  Pt is a 72 y.o. male seen today for an acute visit to review the orders for his corneal surgery.  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 Patient has been seeing ophthalmology for corneal abrasion.  Their plan is to stitch that eye closed. Due to his inability to close his eyes properly it has been causing him to have abrasions in that eye and recurrent infections.  Patient says he cannot see well from that eye anyway and he understands the procedure.  The patient is on Coumadin and it has to be held for 5 days before the surgery.  The surgery is planned for February 25. Past Medical History:  Diagnosis Date  . Dysphasia   . Fatty liver   . Hemiplegia (HCC)   . Hypertension   . Pneumonia   . Pulmonary embolism (HCC)   . 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(HCC)    . Stroke [facility](HCC)    Past Surgical History:  Procedure Laterality Date  . FLEXIBLE SIGMOIDOSCOPY N/A 10/21/2016   Procedure: FLEXIBLE SIGMOIDOSCOPY;  Surgeon: Malissa Hippoehman, Najeeb U, MD;  [facility];  Service: Endoscopy;  Laterality: N/A;  . FLEXIBLE SIGMOIDOSCOPY N/A 10/25/2016   Procedure: FLEXIBLE SIGMOIDOSCOPY;  Surgeon: Malissa Hippoehman, Najeeb U, MD;  Location: [facility];  Service: Endoscopy;  Laterality: N/A;  . unable      Allergies  Allergen Reactions  . Penicillins     Has patient had a PCN reaction causing immediate rash, facial/tongue/throat swelling, SOB or lightheadedness with hypotension: unknown Has patient had a PCN reaction causing severe rash involving mucus membranes or skin necrosis: unknown Has patient had a PCN reaction that required hospitalization: unknown Has patient had a PCN reaction occurring within the last 10 years: unknown If all of the above answers are "NO", then may proceed with Cephalosporin use. 5/3 Tolerated Cefepime x 1 dose in ED.     Outpatient Encounter Medications as of 07/12/2017  Medication Sig  . acetaminophen (TYLENOL) 325 MG tablet Take 650 mg by mouth every 6 (six) hours as needed for moderate pain.   Marland Kitchen. betamethasone dipropionate (DIPROLENE) 0.05 % cream Apply to legs and hands. Do not apply to face groin, axilla. Try Cerave first twice a day.  . budesonide-formoterol (SYMBICORT) 80-4.5 MCG/ACT inhaler Inhale 2 puffs into the lungs 2 (two) times daily.  . carboxymethylcellulose (REFRESH PLUS)  0.5 % SOLN Place 1 drop into both eyes 2 (two) times daily.  . Emollient (CERAVE) CREA Apply 1 application topically daily as needed for itching once a day  . erythromycin ophthalmic ointment appy 1 ribbon ophthalmic (eye) 5 times a day  . Eyelid Cleansers (SYSTANE LID WIPES EX) Apply 1 application topically 2 (two) times daily. Start occusoft lid scrubs: clean eyelids of oil and debris BID OU ( each eye ) before administering eye drops.   Marland Kitchen ipratropium  (ATROVENT) 0.02 % nebulizer solution Take 0.5 mg by nebulization every 6 (six) hours.  Marland Kitchen labetalol (NORMODYNE) 200 MG tablet Take 200 mg by mouth daily. For HTN  . loratadine (CLARITIN) 10 MG tablet Take 10 mg by mouth daily.  . methimazole (TAPAZOLE) 5 MG tablet Take 1 tablet (5 mg total) by mouth daily.  . polyethylene glycol (MIRALAX / GLYCOLAX) packet Take 17 g by mouth at bedtime.  . potassium chloride (K-DUR,KLOR-CON) 10 MEQ tablet Take 40 mEq by mouth daily.   . sennosides-docusate sodium (SENOKOT-S) 8.6-50 MG tablet Take 1 tablet by mouth at bedtime. For constipation  . tiotropium (SPIRIVA HANDIHALER) 18 MCG inhalation capsule Place 18 mcg into inhaler and inhale daily.  Marland Kitchen warfarin (COUMADIN) 1 MG tablet Take 1/2 of ( 1 mg ) tablet by mouth along with 3 mg to = 3.5 mg once a day  . warfarin (COUMADIN) 3 MG tablet Take 3 mg tablet by mouth along with 1/2 tablet of (1 mg) to = 3.5 mg once a day  . White Petrolatum-Mineral Oil (SYSTANE NIGHTTIME) OINT Apply line to left eye at bedtime  . [DISCONTINUED] ketotifen (ZADITOR) 0.025 % ophthalmic solution Place 1 drop into the left eye 3 (three) times daily.   No facility-administered encounter medications on file as of 07/12/2017.      Review of Systems  Immunization History  Administered Date(s) Administered  . Influenza-Unspecified 03/28/2014, 03/23/2016, 03/25/2017  . Pneumococcal Conjugate-13 04/21/2015  . Pneumococcal-Unspecified 11/01/2009, 03/30/2016  . Tdap 03/17/2017   Pertinent  Health Maintenance Due  Topic Date Due  . COLONOSCOPY  10/30/2025 (Originally 11/08/1995)  . INFLUENZA VACCINE  Completed  . PNA vac Low Risk Adult  Completed   Fall Risk  04/05/2017 03/17/2017  Falls in the past year? No No  Risk for fall due to : Impaired balance/gait;Impaired mobility;Medication side effect;Impaired vision;History of fall(s) -   Functional Status Survey:    Vitals:   07/12/17 0954  BP: 140/88  Pulse: 85  Resp: 20  Temp:  (!) 97.5 F (36.4 C)  TempSrc: Oral  SpO2: 93%   There is no height or weight on file to calculate BMI. Physical Exam  Constitutional: He appears well-developed and well-nourished.  HENT:  Head: Normocephalic.  Mouth/Throat: Oropharynx is clear and moist.  Neck: Neck supple.  Cardiovascular: Normal rate and normal heart sounds.  Pulmonary/Chest: Effort normal. He has rales.  Abdominal: Soft. Bowel sounds are normal. He exhibits no distension. There is no tenderness. There is no rebound.  Musculoskeletal:  Chronic Venous changes.  Neurological: He is alert.  Psychiatric: He has a normal mood and affect. His behavior is normal.    Labs reviewed: Recent Labs    10/29/16 0412  11/01/16 0700  11/04/16 0438  01/24/17 1045 03/09/17 0711 04/06/17 0701  NA 147*   < > 146*   < > 143   < > 140 139 137  K 2.6*   < > 2.8*   < > 2.9*   < >  4.9 4.0 4.1  CL 115*   < > 110   < > 107   < > 106 105 100*  CO2 28   < > 29   < > 31   < > 25 26 28   GLUCOSE 96   < > 82   < > 90   < > 89 84 93  BUN 9   < > 14   < > 12   < > 25* 24* 21*  CREATININE 0.89   < > 0.85   < > 0.86   < > 1.12 0.99 1.03  CALCIUM 7.9*   < > 8.1*   < > 7.9*   < > 9.1 8.7* 8.8*  MG 1.6*  --  1.8  --  1.6*  --   --   --   --    < > = values in this interval not displayed.   Recent Labs    10/21/16 0914 11/03/16 0455 12/21/16 0500  AST 38 26 23  ALT 45 42 29  ALKPHOS 59 51 58  BILITOT 0.7 0.4 0.4  PROT 8.2* 6.4* 7.0  ALBUMIN 3.5 2.9* 3.2*   Recent Labs    11/24/16 0718 12/21/16 0500 01/06/17 0715 03/09/17 0711  WBC 8.6 7.8  --  7.7  NEUTROABS 5.0 4.2  --  4.5  HGB 14.4 13.9  --  15.1  HCT 44.3 44.1 44.5 46.2  MCV 92.3 93.4  --  91.5  PLT 308 280  --  269   Lab Results  Component Value Date   TSH 0.000 (L) 06/15/2017   Lab Results  Component Value Date   HGBA1C  03/27/2009    6.0 (NOTE) The ADA recommends the following therapeutic goal for glycemic control related to Hgb A1c measurement: Goal of  therapy: <6.5 Hgb A1c  Reference: American Diabetes Association: Clinical Practice Recommendations 2010, Diabetes Care, 2010, 33: (Suppl  1).   Lab Results  Component Value Date   CHOL 106 03/09/2017   HDL 31 (L) 03/09/2017   LDLCALC 67 03/09/2017   TRIG 42 03/09/2017   CHOLHDL 3.4 03/09/2017    Significant Diagnostic Results in last 30 days:  No results found.  Assessment/Plan Patient with multiple problems including history of recurrent PE and DVT with left CVA leading to right hemiparesis in 2015 Due to above-mentioned  problems patient has been on chronic Coumadin.  By my assessment patient is intermediate risk. Will hold his Coumadin 5 days before surgery and start him on heparinLMW 40 mg BID Restart Coumadin 24 hours after surgery with  Bridging with LMW heparin until desired INR is achieved Orders were written and D/W the In charge Nurse. And patient   Family/ staff Communication:   Labs/tests ordered:

## 2017-07-13 DIAGNOSIS — S0500XA Injury of conjunctiva and corneal abrasion without foreign body, unspecified eye, initial encounter: Secondary | ICD-10-CM | POA: Insufficient documentation

## 2017-07-19 ENCOUNTER — Encounter (HOSPITAL_COMMUNITY)
Admission: RE | Admit: 2017-07-19 | Discharge: 2017-07-19 | Disposition: A | Discharge status: Home / Self Care | Payer: Medicare Other | Source: Skilled Nursing Facility | Attending: Internal Medicine | Admitting: Internal Medicine

## 2017-07-19 DIAGNOSIS — Z7901 Long term (current) use of anticoagulants: Secondary | ICD-10-CM | POA: Insufficient documentation

## 2017-07-19 DIAGNOSIS — I69328 Other speech and language deficits following cerebral infarction: Secondary | ICD-10-CM | POA: Insufficient documentation

## 2017-07-19 DIAGNOSIS — I10 Essential (primary) hypertension: Secondary | ICD-10-CM | POA: Insufficient documentation

## 2017-07-19 DIAGNOSIS — K562 Volvulus: Secondary | ICD-10-CM | POA: Insufficient documentation

## 2017-07-19 DIAGNOSIS — M6281 Muscle weakness (generalized): Secondary | ICD-10-CM | POA: Insufficient documentation

## 2017-07-19 DIAGNOSIS — R1312 Dysphagia, oropharyngeal phase: Secondary | ICD-10-CM | POA: Diagnosis not present

## 2017-07-19 DIAGNOSIS — R293 Abnormal posture: Secondary | ICD-10-CM | POA: Insufficient documentation

## 2017-07-19 LAB — COMPREHENSIVE METABOLIC PANEL
ALK PHOS: 79 U/L (ref 38–126)
ALT: 29 U/L (ref 17–63)
ANION GAP: 13 (ref 5–15)
AST: 25 U/L (ref 15–41)
Albumin: 3.3 g/dL — ABNORMAL LOW (ref 3.5–5.0)
BUN: 20 mg/dL (ref 6–20)
CALCIUM: 8.9 mg/dL (ref 8.9–10.3)
CO2: 24 mmol/L (ref 22–32)
CREATININE: 1.02 mg/dL (ref 0.61–1.24)
Chloride: 102 mmol/L (ref 101–111)
Glucose, Bld: 80 mg/dL (ref 65–99)
Potassium: 4.2 mmol/L (ref 3.5–5.1)
SODIUM: 139 mmol/L (ref 135–145)
TOTAL PROTEIN: 7.3 g/dL (ref 6.5–8.1)
Total Bilirubin: 0.7 mg/dL (ref 0.3–1.2)

## 2017-07-19 LAB — CBC WITH DIFFERENTIAL/PLATELET
Basophils Absolute: 0 10*3/uL (ref 0.0–0.1)
Basophils Relative: 0 %
EOS ABS: 0.4 10*3/uL (ref 0.0–0.7)
Eosinophils Relative: 5 %
HEMATOCRIT: 48.6 % (ref 39.0–52.0)
HEMOGLOBIN: 15.5 g/dL (ref 13.0–17.0)
LYMPHS ABS: 2.4 10*3/uL (ref 0.7–4.0)
Lymphocytes Relative: 32 %
MCH: 29.9 pg (ref 26.0–34.0)
MCHC: 31.9 g/dL (ref 30.0–36.0)
MCV: 93.8 fL (ref 78.0–100.0)
MONOS PCT: 8 %
Monocytes Absolute: 0.6 10*3/uL (ref 0.1–1.0)
NEUTROS ABS: 4.1 10*3/uL (ref 1.7–7.7)
NEUTROS PCT: 55 %
Platelets: 297 10*3/uL (ref 150–400)
RBC: 5.18 MIL/uL (ref 4.22–5.81)
RDW: 14.4 % (ref 11.5–15.5)
WBC: 7.5 10*3/uL (ref 4.0–10.5)

## 2017-07-19 LAB — LIPID PANEL
CHOL/HDL RATIO: 3.4 ratio
Cholesterol: 134 mg/dL (ref 0–200)
HDL: 39 mg/dL — AB (ref >40)
LDL Cholesterol: 85 mg/dL (ref 0–99)
TRIGLYCERIDES: 52 mg/dL (ref <150)
VLDL: 10 mg/dL (ref 0–40)

## 2017-07-19 LAB — PROTIME-INR
INR: 1.75
PROTHROMBIN TIME: 20.3 s — AB (ref 11.4–15.2)

## 2017-07-26 ENCOUNTER — Encounter (HOSPITAL_COMMUNITY)
Admission: RE | Admit: 2017-07-26 | Discharge: 2017-07-26 | Disposition: A | Discharge status: Home / Self Care | Payer: Medicare Other | Source: Skilled Nursing Facility | Attending: Internal Medicine | Admitting: Internal Medicine

## 2017-07-26 DIAGNOSIS — M6281 Muscle weakness (generalized): Secondary | ICD-10-CM | POA: Diagnosis not present

## 2017-07-26 DIAGNOSIS — K562 Volvulus: Secondary | ICD-10-CM | POA: Diagnosis not present

## 2017-07-26 DIAGNOSIS — I10 Essential (primary) hypertension: Secondary | ICD-10-CM | POA: Insufficient documentation

## 2017-07-26 DIAGNOSIS — R293 Abnormal posture: Secondary | ICD-10-CM | POA: Diagnosis not present

## 2017-07-26 DIAGNOSIS — R1312 Dysphagia, oropharyngeal phase: Secondary | ICD-10-CM | POA: Diagnosis not present

## 2017-07-26 DIAGNOSIS — I69328 Other speech and language deficits following cerebral infarction: Secondary | ICD-10-CM | POA: Insufficient documentation

## 2017-07-26 LAB — PROTIME-INR
INR: 2.03
Prothrombin Time: 22.8 seconds — ABNORMAL HIGH (ref 11.4–15.2)

## 2017-07-28 ENCOUNTER — Non-Acute Institutional Stay (SKILLED_NURSING_FACILITY): Payer: Medicare Other | Admitting: Internal Medicine

## 2017-07-28 ENCOUNTER — Encounter: Payer: Self-pay | Admitting: Internal Medicine

## 2017-07-28 DIAGNOSIS — I639 Cerebral infarction, unspecified: Secondary | ICD-10-CM

## 2017-07-28 DIAGNOSIS — J410 Simple chronic bronchitis: Secondary | ICD-10-CM

## 2017-07-28 DIAGNOSIS — S0502XS Injury of conjunctiva and corneal abrasion without foreign body, left eye, sequela: Secondary | ICD-10-CM | POA: Diagnosis not present

## 2017-07-28 DIAGNOSIS — I10 Essential (primary) hypertension: Secondary | ICD-10-CM

## 2017-07-28 DIAGNOSIS — E0590 Thyrotoxicosis, unspecified without thyrotoxic crisis or storm: Secondary | ICD-10-CM | POA: Diagnosis not present

## 2017-07-28 DIAGNOSIS — K562 Volvulus: Secondary | ICD-10-CM | POA: Diagnosis not present

## 2017-07-28 NOTE — Progress Notes (Signed)
Location:   [facility] Room Number: 112/W Place of Service:  SNF [phone]) Provider:  Sabino Dick, MD  [name], [name]  Extended Emergency Contact Information Primary Emergency Contact: [name] Address: [address]          [place], [place] [address] [place] Home Phone: [phone] Mobile Phone: [phone] Relation: Brother  Code Status: Full Code  Goals of care: Advanced Directive information Advanced Directives 07/28/2017  Does Patient Have a Medical Advance Directive? Yes  Type of Advance Directive (No Data)  Does patient want to make changes to medical advance directive? No - Patient declined  Copy of Healthcare Power of Attorney in Chart? No - copy requested  Would patient like information on creating a medical advance directive? No - Patient declined     Chief Complaint  Patient presents with  . Medical Management of Chronic Issues    Routine Visit  For medical management of chronic medical conditions including history of COPD CVA with right-sided hemiparesis history of pulmonary embolism and DVT on chronic Coumadin-hypertension- recent history of sigmoid volvulus-subclinical hyperthyroidism-corneal abrasion-  HPI:  Pt is a 72 y.o. male seen today for medical management of chronic diseases as noted above.  Most recent acute issue has been a left corneal abrasion he is followed by ophthalmology and actually does have a procedure scheduled for later this month.  To have the left eye stitched closed--- There are already orders to hold the Coumadin for 5 days before the surgery on February 25.  And will be bridged on heparin LMV  Patient is not able to see out of that eye  He   currently is not complaining of any discomfort in that regards.  Patient also has a history of hyperthyroidism is now on Tapazole he is followed by endocrinology.  He also at one  point had a sigmoid volvulus which involved some hospitalizations-however this has significantly stabilized he did not have surgery-he is on laxatives including MiraLAX and senna and this appears to be working well.  His weight appears to be relatively stable at 181 pounds.  In regards to COPD continues on Spiriva and Symbicort and has duo nebs as needed occasionally he will have an ex aspiration but this is been stable recently.  Does have a history of CVA with right-sided hemiparesis which is baseline he is on Coumadin for anticoagulation INR has been therapeutic he also has a history of pulmonary embolism and DVT in the past again another indication for him being on Coumadin.  Most recent INR was therapeutic at 2.03 update INR is pending.  Regards to hypertension he is on labetalol lisinopril was discontinued secondary to hypotension concerns I got a blood pressure 120/78 today- some listed readings still show systolic above 140 but this does not appear to be consistent and apparently he did develop hypotension with the addition of the lisinopril  Currently he is sitting in his wheelchair comfortably and has no complaints continues to ambulate about facility in the wheelchair is still motivated to ambulate in his wheelchair   .   Past Medical History:  Diagnosis Date  . Dysphasia   . Fatty liver   . Hemiplegia (HCC)   . Hypertension   . Pneumonia   . Pulmonary embolism (HCC)   . 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(HCC)   . Stroke [facility])    Past Surgical History:  Procedure Laterality Date  .  FLEXIBLE SIGMOIDOSCOPY N/A 10/21/2016   Procedure: FLEXIBLE SIGMOIDOSCOPY;  Surgeon: Malissa Hippoehman, Najeeb U, MD;  [facility];  Service: Endoscopy;  Laterality: N/A;  . FLEXIBLE SIGMOIDOSCOPY N/A 10/25/2016   Procedure: FLEXIBLE SIGMOIDOSCOPY;  Surgeon: Malissa Hippoehman, Najeeb U, MD;  [facility];  Service: Endoscopy;  Laterality: N/A;  . unable      Allergies  Allergen Reactions  . Penicillins      Has patient had a PCN reaction causing immediate rash, facial/tongue/throat swelling, SOB or lightheadedness with hypotension: unknown Has patient had a PCN reaction causing severe rash involving mucus membranes or skin necrosis: unknown Has patient had a PCN reaction that required hospitalization: unknown Has patient had a PCN reaction occurring within the last 10 years: unknown If all of the above answers are "NO", then may proceed with Cephalosporin use. 5/3 Tolerated Cefepime x 1 dose in ED.     Outpatient Encounter Medications as of 07/28/2017  Medication Sig  . acetaminophen (TYLENOL) 325 MG tablet Take 650 mg by mouth every 6 (six) hours as needed for moderate pain.   Marland Kitchen. betamethasone dipropionate (DIPROLENE) 0.05 % cream Apply to legs and hands. Do not apply to face groin, axilla. Try Cerave first twice a day.  . budesonide-formoterol (SYMBICORT) 80-4.5 MCG/ACT inhaler Inhale 2 puffs into the lungs 2 (two) times daily.  . carboxymethylcellulose (REFRESH PLUS) 0.5 % SOLN Place 1 drop into both eyes 2 (two) times daily.  . Emollient (CERAVE) CREA Apply 1 application topically daily as needed for itching once a day  . enoxaparin (LOVENOX) 40 MG/0.4ML injection Inject 40 mg into the skin 2 (two) times daily. From 08/10/2017-08/14/2017 PRE/POST surgery  . enoxaparin (LOVENOX) 40 MG/0.4ML injection Inject 40 mg into the skin 2 (two) times daily. MD to D/C when INR is therapeutic start date 08/16/2017  . erythromycin ophthalmic ointment appy 1 ribbon ophthalmic (eye) 5 times a day  . Eyelid Cleansers (SYSTANE LID WIPES EX) Apply 1 application topically 2 (two) times daily. Start occusoft lid scrubs: clean eyelids of oil and debris BID OU ( each eye ) before administering eye drops.   Marland Kitchen. ipratropium (ATROVENT) 0.02 % nebulizer solution Take 0.5 mg by nebulization every 6 (six) hours.  Marland Kitchen. labetalol (NORMODYNE) 200 MG tablet Take 200 mg by mouth daily. For HTN  . loratadine (CLARITIN) 10 MG tablet Take  10 mg by mouth daily.  . methimazole (TAPAZOLE) 5 MG tablet Take 1 tablet (5 mg total) by mouth daily.  . polyethylene glycol (MIRALAX / GLYCOLAX) packet Take 17 g by mouth at bedtime.  . potassium chloride (K-DUR,KLOR-CON) 10 MEQ tablet Take 40 mEq by mouth daily.   . sennosides-docusate sodium (SENOKOT-S) 8.6-50 MG tablet Take 1 tablet by mouth at bedtime. For constipation  . tiotropium (SPIRIVA HANDIHALER) 18 MCG inhalation capsule Place 18 mcg into inhaler and inhale daily.  Marland Kitchen. warfarin (COUMADIN) 1 MG tablet Take 1/2 of ( 1 mg ) tablet by mouth along with 3 mg to = 3.5 mg once a day  . warfarin (COUMADIN) 3 MG tablet Take 3 mg tablet by mouth along with 1/2 tablet of (1 mg) to = 3.5 mg once a day  . warfarin (COUMADIN) 4 MG tablet Take 4 mg tablet by mouth once a day on Tues., Thus.,  . White Petrolatum-Mineral Oil (SYSTANE NIGHTTIME) OINT Apply line to left eye at bedtime   No facility-administered encounter medications on file as of 07/28/2017.      Review of Systems  In general is not complain of any fever or chills his weight appears to be stabilized.  Skin does not complain of rashes or itching.  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  Respiratory is not complaining of shortness of breath or cough does have a history of COPD with some chronic congestion.  Cardiac does not complain of chest pain has some mild lower extremity edema which appears to be baseline.  GI is not complaining of abdominal discomfort nausea vomiting diarrhea constipation apparently has a good appetite.  He is having regular bowel movements.  GU does not complain of dysuria.  Muscular skeletal does not currently complain of joint pain does have a history of right-sided weakness again with a history of CVA.  Neurologic is not complaining of dizziness headache or syncope and continues with baseline  right-sided weakness.  And psych continues to be pleasant and appropriate largely alert and oriented nursing staff does not report increased anxiety or depressive symptoms  Immunization History  Administered Date(s) Administered  . Influenza-Unspecified 03/28/2014, 03/23/2016, 03/25/2017  . Pneumococcal Conjugate-13 04/21/2015  . Pneumococcal-Unspecified 11/01/2009, 03/30/2016  . Tdap 03/17/2017   Pertinent  Health Maintenance Due  Topic Date Due  . COLONOSCOPY  10/30/2025 (Originally 11/08/1995)  . INFLUENZA VACCINE  Completed  . PNA vac Low Risk Adult  Completed   Fall Risk  04/05/2017 03/17/2017  Falls in the past year? No No  Risk for fall due to : Impaired balance/gait;Impaired mobility;Medication side effect;Impaired vision;History of fall(s) -   Functional Status Survey:    Vitals:   07/28/17 1530  BP: 136/80  Pulse: 70  Resp: 20  Temp: (!) 97.4 F (36.3 C)  TempSrc: Oral  SpO2: 96%  Weight: 181 lb 6.4 oz (82.3 kg)  Height: 5' 8.5" (1.74 m)  Of note manual blood pressure was 120/78- pulse was 84 Body mass index is 27.18 kg/m. Physical Exam   In general this is a pleasant elderly male in no distress sitting comfortably in his wheelchair.  His skin is warm and dry he does have an opacity with some erythema of his left eye.  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  Oropharynx is clear mucous membranes moist.  Chest he does have some scattered rales which is baseline there is no labored breathing.  Heart is regular rate and rhythm without murmur gallop or rub he has mild baseline lower extremity edema bilaterally.  Abdomen is soft nontender with active bowel sounds.  Musculoskeletal continues with right-sided weakness with contracture of his right arm and hand.  Has limited motion of his right-sided extremities-left side extremities appear to be intact and this is how he ambulates in his  wheelchair in the hallway.  Neurologic as noted above has slurred speech which is baseline.  Psych again he is alert and oriented pleasant and appropriate  Labs reviewed: Recent Labs    10/29/16 0412  11/01/16 0700  11/04/16 0438  03/09/17 0711 04/06/17 0701 07/19/17 0709  NA 147*   < > 146*   < > 143   < > 139 137 139  K 2.6*   < > 2.8*   < > 2.9*   < > 4.0 4.1 4.2  CL 115*   < > 110   < > 107   < > 105 100* 102  CO2  28   < > 29   < > 31   < > 26 28 24   GLUCOSE 96   < > 82   < > 90   < > 84 93 80  BUN 9   < > 14   < > 12   < > 24* 21* 20  CREATININE 0.89   < > 0.85   < > 0.86   < > 0.99 1.03 1.02  CALCIUM 7.9*   < > 8.1*   < > 7.9*   < > 8.7* 8.8* 8.9  MG 1.6*  --  1.8  --  1.6*  --   --   --   --    < > = values in this interval not displayed.   Recent Labs    11/03/16 0455 12/21/16 0500 07/19/17 0709  AST 26 23 25   ALT 42 29 29  ALKPHOS 51 58 79  BILITOT 0.4 0.4 0.7  PROT 6.4* 7.0 7.3  ALBUMIN 2.9* 3.2* 3.3*   Recent Labs    12/21/16 0500 01/06/17 0715 03/09/17 0711 07/19/17 0709  WBC 7.8  --  7.7 7.5  NEUTROABS 4.2  --  4.5 4.1  HGB 13.9  --  15.1 15.5  HCT 44.1 44.5 46.2 48.6  MCV 93.4  --  91.5 93.8  PLT 280  --  269 297   Lab Results  Component Value Date   TSH 0.000 (L) 06/15/2017   Lab Results  Component Value Date   HGBA1C  03/27/2009    6.0 (NOTE) The ADA recommends the following therapeutic goal for glycemic control related to Hgb A1c measurement: Goal of therapy: <6.5 Hgb A1c  Reference: American Diabetes Association: Clinical Practice Recommendations 2010, Diabetes Care, 2010, 33: (Suppl  1).   Lab Results  Component Value Date   CHOL 134 07/19/2017   HDL 39 (L) 07/19/2017   LDLCALC 85 07/19/2017   TRIG 52 07/19/2017   CHOLHDL 3.4 07/19/2017    Significant Diagnostic Results in last 30 days:  No results found.  Assessment/Plan  #1 history of COPD this appears relatively stable on Spiriva and Symbicort he has duo nebs as needed  there have been no recent exasperations at this point monitor.  2.  History of CVA with right-sided hemiparesis this appears at baseline he does well with supportive care--continues on Coumadin.  3.  History of pulmonary embolism DVT in the past again he is on Coumadin INR has been therapeutic update INR is pending there is been no increased bruising or bleeding.  4.  Hypertension this appears relatively stable he does not appear to have consistent elevations blood pressure today was 120/78 taken manually he is on labetalol lisinopril is been DC'd because of hypotension concerns.  Is #5 history of sigmoid volvulus again this was quite an issue initially requiring hospitalizations but this has stabilized now for an extended period of time he is on MiraLAX and senna and is having regular bowel movements his weight appears to have stabilized.  6.  History of subclinical hyperthyroidism he is followed by endocrinology he is on Tapazole.  Per review of most recent endocrinology note T4 was acceptable at 1.06 TSH was still lagging but no need for adjustment-he has not been tachycardic pulse was 84 on exam today  7.  History of left corneal abrasion as noted above he will have his left eyelid stitched later this month by ophthalmology-patient is comfortable with this he does not have site in that  eye.--He continues on erythromycin eyedrops per ophthalmology   8.  History of hypokalemia again there is suggestion to aggressively treat this with his history of sigmoid volvulus last potassium was 4.2 on lab done last week will monitor this periodically he is on 40 mEq of potassium a day.  9.  History of allergic rhinitis he is on Claritan--and has been on this for an extended period of time and tolerated it well apparently it is helping.   [id number]

## 2017-08-02 ENCOUNTER — Encounter (HOSPITAL_COMMUNITY)
Admission: RE | Admit: 2017-08-02 | Discharge: 2017-08-02 | Disposition: A | Discharge status: Home / Self Care | Payer: Medicare Other | Source: Skilled Nursing Facility | Attending: Internal Medicine | Admitting: Internal Medicine

## 2017-08-02 DIAGNOSIS — R1312 Dysphagia, oropharyngeal phase: Secondary | ICD-10-CM | POA: Insufficient documentation

## 2017-08-02 DIAGNOSIS — I69328 Other speech and language deficits following cerebral infarction: Secondary | ICD-10-CM | POA: Insufficient documentation

## 2017-08-02 DIAGNOSIS — R293 Abnormal posture: Secondary | ICD-10-CM | POA: Diagnosis not present

## 2017-08-02 DIAGNOSIS — I10 Essential (primary) hypertension: Secondary | ICD-10-CM | POA: Insufficient documentation

## 2017-08-02 DIAGNOSIS — K562 Volvulus: Secondary | ICD-10-CM | POA: Diagnosis not present

## 2017-08-02 DIAGNOSIS — M6281 Muscle weakness (generalized): Secondary | ICD-10-CM | POA: Diagnosis not present

## 2017-08-02 LAB — PROTIME-INR
INR: 2.12
Prothrombin Time: 23.6 seconds — ABNORMAL HIGH (ref 11.4–15.2)

## 2017-08-09 ENCOUNTER — Encounter (HOSPITAL_COMMUNITY)
Admission: RE | Admit: 2017-08-09 | Discharge: 2017-08-09 | Disposition: A | Discharge status: Home / Self Care | Payer: Medicare Other | Source: Skilled Nursing Facility | Attending: Internal Medicine | Admitting: Internal Medicine

## 2017-08-09 DIAGNOSIS — Z7901 Long term (current) use of anticoagulants: Secondary | ICD-10-CM | POA: Diagnosis not present

## 2017-08-09 DIAGNOSIS — R1312 Dysphagia, oropharyngeal phase: Secondary | ICD-10-CM | POA: Diagnosis not present

## 2017-08-09 DIAGNOSIS — K562 Volvulus: Secondary | ICD-10-CM | POA: Diagnosis not present

## 2017-08-09 DIAGNOSIS — I69328 Other speech and language deficits following cerebral infarction: Secondary | ICD-10-CM | POA: Diagnosis not present

## 2017-08-09 DIAGNOSIS — I10 Essential (primary) hypertension: Secondary | ICD-10-CM | POA: Diagnosis not present

## 2017-08-09 DIAGNOSIS — R293 Abnormal posture: Secondary | ICD-10-CM | POA: Diagnosis not present

## 2017-08-09 DIAGNOSIS — M6281 Muscle weakness (generalized): Secondary | ICD-10-CM | POA: Insufficient documentation

## 2017-08-09 LAB — PROTIME-INR
INR: 2.31
PROTHROMBIN TIME: 25.2 s — AB (ref 11.4–15.2)

## 2017-08-15 ENCOUNTER — Encounter (HOSPITAL_COMMUNITY)
Admission: RE | Admit: 2017-08-15 | Discharge: 2017-08-15 | Disposition: A | Discharge status: Home / Self Care | Payer: Medicare Other | Source: Skilled Nursing Facility

## 2017-08-15 DIAGNOSIS — I69328 Other speech and language deficits following cerebral infarction: Secondary | ICD-10-CM | POA: Diagnosis not present

## 2017-08-15 DIAGNOSIS — R293 Abnormal posture: Secondary | ICD-10-CM | POA: Diagnosis not present

## 2017-08-15 DIAGNOSIS — H179 Unspecified corneal scar and opacity: Secondary | ICD-10-CM | POA: Diagnosis not present

## 2017-08-15 DIAGNOSIS — M6281 Muscle weakness (generalized): Secondary | ICD-10-CM | POA: Diagnosis not present

## 2017-08-15 DIAGNOSIS — I10 Essential (primary) hypertension: Secondary | ICD-10-CM | POA: Diagnosis not present

## 2017-08-15 DIAGNOSIS — R1312 Dysphagia, oropharyngeal phase: Secondary | ICD-10-CM | POA: Diagnosis not present

## 2017-08-15 DIAGNOSIS — K562 Volvulus: Secondary | ICD-10-CM | POA: Diagnosis not present

## 2017-08-15 LAB — PROTIME-INR
INR: 1.07
Prothrombin Time: 13.9 seconds (ref 11.4–15.2)

## 2017-08-18 ENCOUNTER — Encounter (HOSPITAL_COMMUNITY)
Admission: RE | Admit: 2017-08-18 | Discharge: 2017-08-18 | Disposition: A | Discharge status: Home / Self Care | Payer: Medicare Other | Source: Skilled Nursing Facility | Attending: Internal Medicine | Admitting: Internal Medicine

## 2017-08-18 DIAGNOSIS — Z7901 Long term (current) use of anticoagulants: Secondary | ICD-10-CM | POA: Insufficient documentation

## 2017-08-18 DIAGNOSIS — R1312 Dysphagia, oropharyngeal phase: Secondary | ICD-10-CM | POA: Insufficient documentation

## 2017-08-18 DIAGNOSIS — K562 Volvulus: Secondary | ICD-10-CM | POA: Diagnosis not present

## 2017-08-18 DIAGNOSIS — I69328 Other speech and language deficits following cerebral infarction: Secondary | ICD-10-CM | POA: Diagnosis not present

## 2017-08-18 DIAGNOSIS — R293 Abnormal posture: Secondary | ICD-10-CM | POA: Insufficient documentation

## 2017-08-18 DIAGNOSIS — M6281 Muscle weakness (generalized): Secondary | ICD-10-CM | POA: Insufficient documentation

## 2017-08-18 DIAGNOSIS — I10 Essential (primary) hypertension: Secondary | ICD-10-CM | POA: Insufficient documentation

## 2017-08-18 LAB — PROTIME-INR
INR: 1.31
Prothrombin Time: 16.2 seconds — ABNORMAL HIGH (ref 11.4–15.2)

## 2017-08-22 ENCOUNTER — Encounter (HOSPITAL_COMMUNITY)
Admission: RE | Admit: 2017-08-22 | Discharge: 2017-08-22 | Disposition: A | Discharge status: Home / Self Care | Payer: Medicare Other | Source: Skilled Nursing Facility | Attending: Internal Medicine | Admitting: Internal Medicine

## 2017-08-22 DIAGNOSIS — G8191 Hemiplegia, unspecified affecting right dominant side: Secondary | ICD-10-CM | POA: Insufficient documentation

## 2017-08-22 DIAGNOSIS — R293 Abnormal posture: Secondary | ICD-10-CM | POA: Diagnosis not present

## 2017-08-22 DIAGNOSIS — I10 Essential (primary) hypertension: Secondary | ICD-10-CM | POA: Insufficient documentation

## 2017-08-22 DIAGNOSIS — I69998 Other sequelae following unspecified cerebrovascular disease: Secondary | ICD-10-CM | POA: Insufficient documentation

## 2017-08-22 DIAGNOSIS — Z7901 Long term (current) use of anticoagulants: Secondary | ICD-10-CM | POA: Insufficient documentation

## 2017-08-22 LAB — PROTIME-INR
INR: 2.1
PROTHROMBIN TIME: 23.4 s — AB (ref 11.4–15.2)

## 2017-08-23 ENCOUNTER — Encounter (HOSPITAL_COMMUNITY)
Admission: RE | Admit: 2017-08-23 | Discharge: 2017-08-23 | Disposition: A | Discharge status: Home / Self Care | Payer: Medicare Other | Source: Skilled Nursing Facility | Attending: Internal Medicine | Admitting: Internal Medicine

## 2017-08-23 DIAGNOSIS — I10 Essential (primary) hypertension: Secondary | ICD-10-CM | POA: Insufficient documentation

## 2017-08-23 DIAGNOSIS — R1312 Dysphagia, oropharyngeal phase: Secondary | ICD-10-CM | POA: Insufficient documentation

## 2017-08-23 DIAGNOSIS — K562 Volvulus: Secondary | ICD-10-CM | POA: Insufficient documentation

## 2017-08-23 DIAGNOSIS — M6281 Muscle weakness (generalized): Secondary | ICD-10-CM | POA: Insufficient documentation

## 2017-08-23 DIAGNOSIS — I69328 Other speech and language deficits following cerebral infarction: Secondary | ICD-10-CM | POA: Insufficient documentation

## 2017-08-23 DIAGNOSIS — R293 Abnormal posture: Secondary | ICD-10-CM | POA: Insufficient documentation

## 2017-08-23 DIAGNOSIS — Z7901 Long term (current) use of anticoagulants: Secondary | ICD-10-CM | POA: Insufficient documentation

## 2017-08-26 IMAGING — DX DG ABDOMEN 1V
2 series · 2 of 2 positions shown · non-contrast
Comparison: CT scan and radiographs of same day.

CLINICAL DATA: Abdominal bloating.

EXAM:
ABDOMEN - 1 VIEW

[abdomen erect]
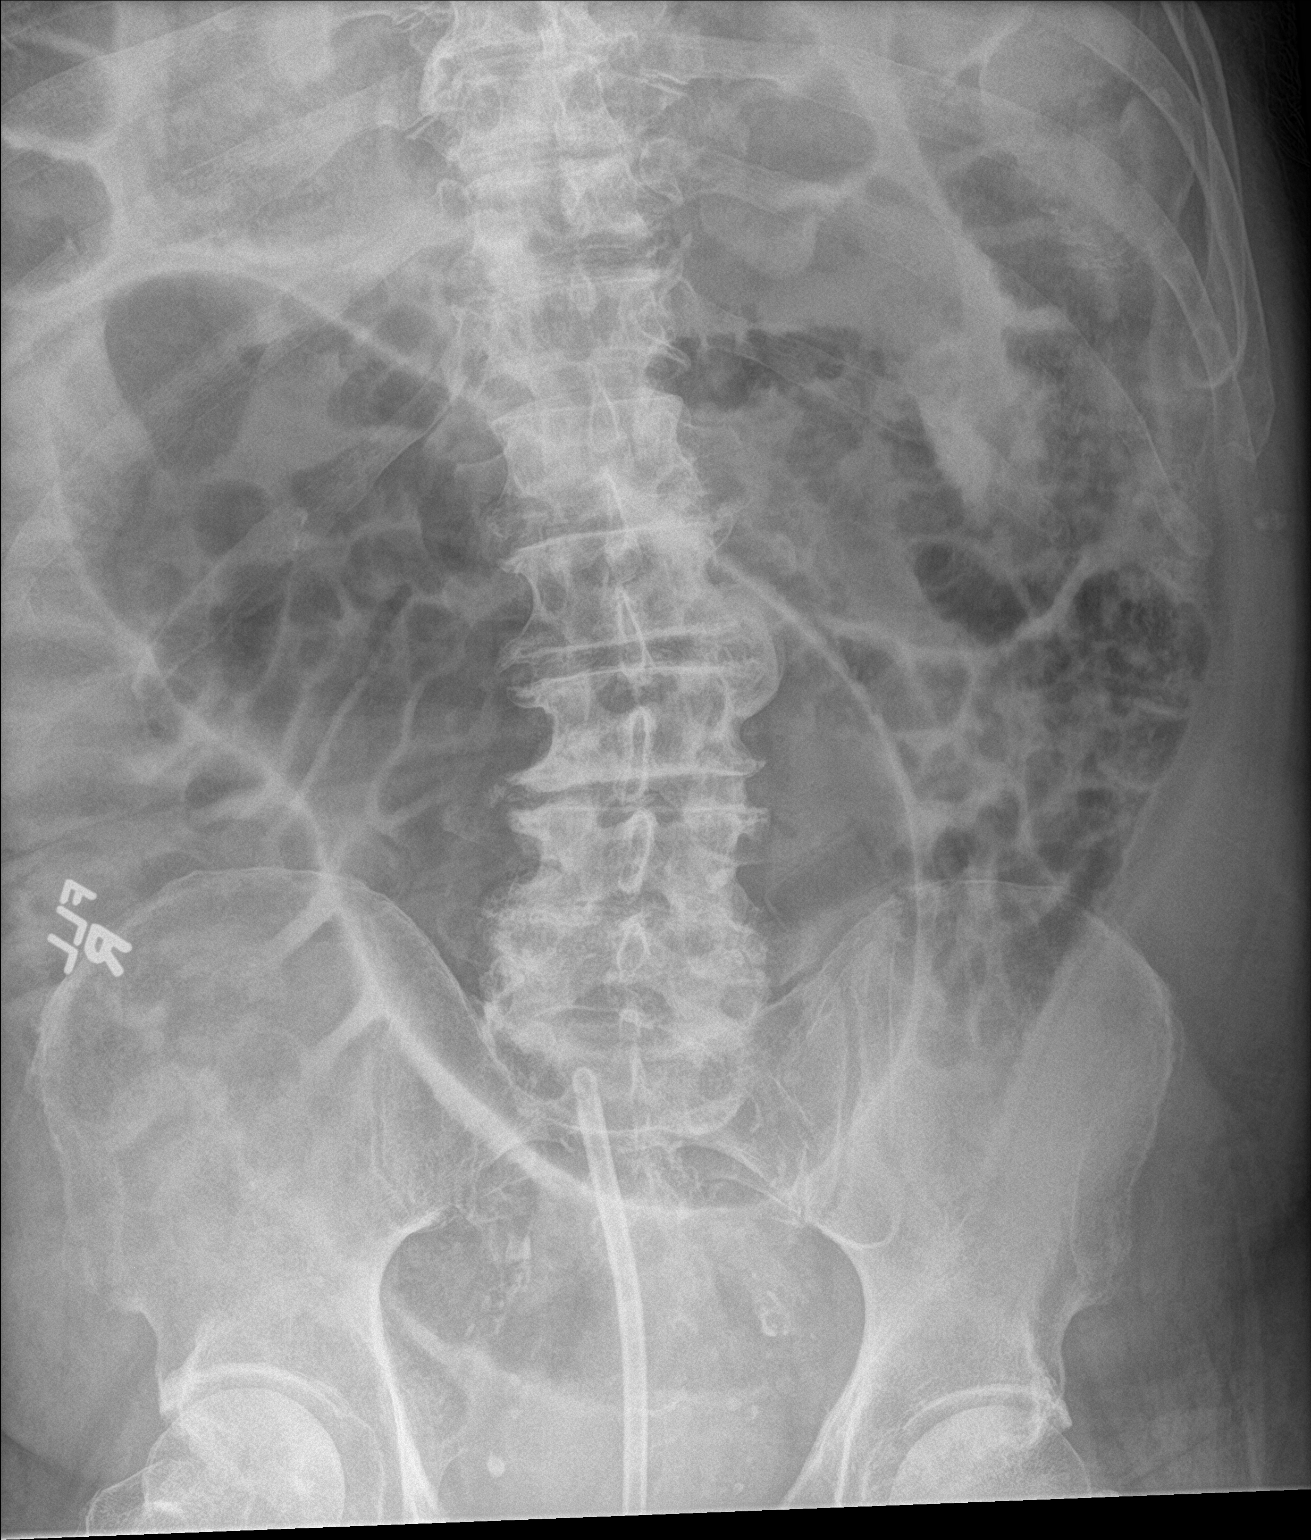

[abdomen supine]
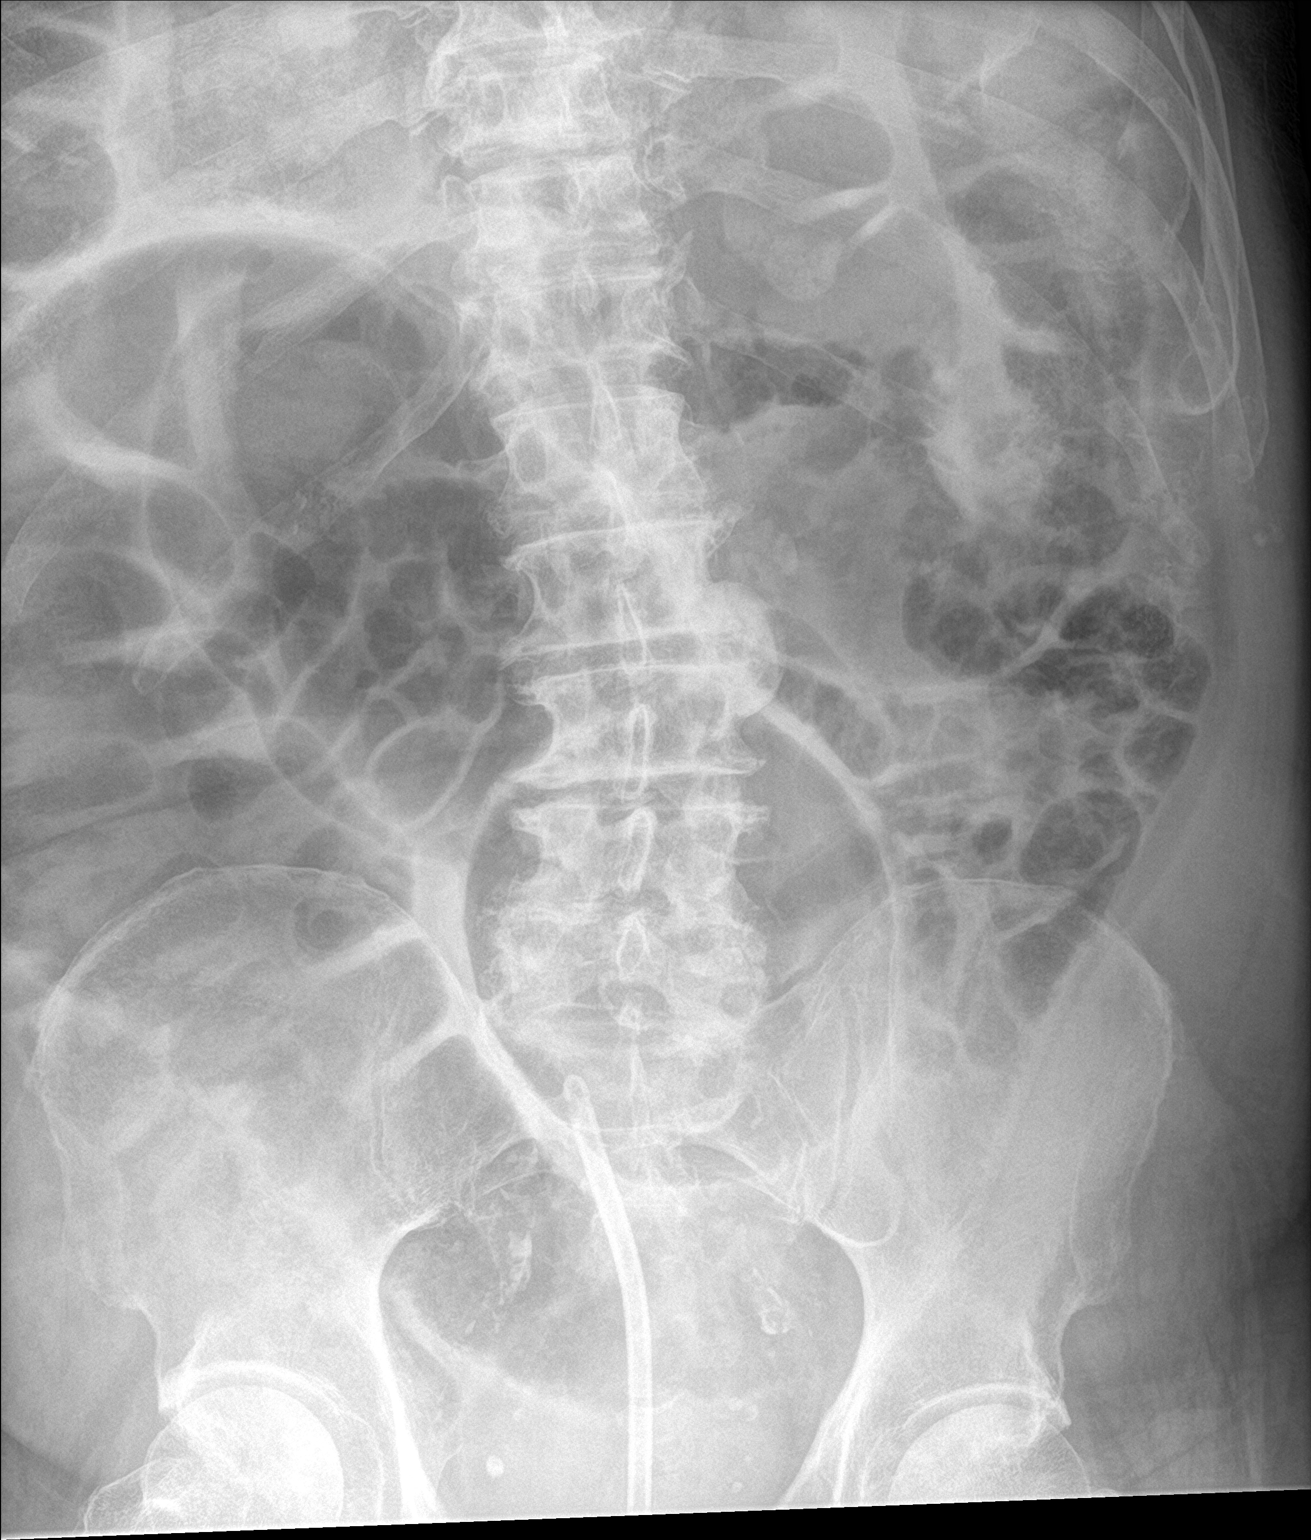

[2 of 2 positions shown; findings below may reference images not displayed]

FINDINGS: There remains severe dilatation of the sigmoid colon consistent with
sigmoid volvulus as described on prior CT scan. No small bowel
dilatation is noted. Rectal tube is noted. Proximal colonic
dilatation noted on prior exam appears to be significantly improved.
IMPRESSION: Rectal tube is noted. Proximal colonic dilatation noted on prior
exam appears to be significantly improved, although there is
continued severe dilatation the sigmoid colon consistent with
sigmoid volvulus as described on prior CT scan.

## 2017-08-26 IMAGING — CT CT RENAL STONE PROTOCOL
2 of 4 series · 16 of 46 positions shown, 18 images · non-contrast
Comparison: KUB 11/03/2016.  CT abdomen pelvis 10/27/2016

CLINICAL DATA: Abdominal pain 2 days. Distended abdomen. History of
volvulus.

EXAM:
CT ABDOMEN AND PELVIS WITHOUT CONTRAST
TECHNIQUE: Multidetector CT imaging of the abdomen and pelvis was performed
following the standard protocol without IV contrast.

[Series 2: axial st · axial · 0.98mm/px · z∈[+721,+1206]mm · 13 of 107 slices shown, 15 images]
[im 5/107  soft-tissue]
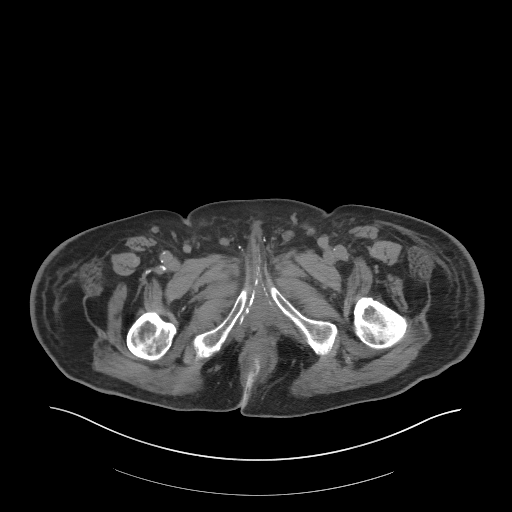
[im 5/107  bone]
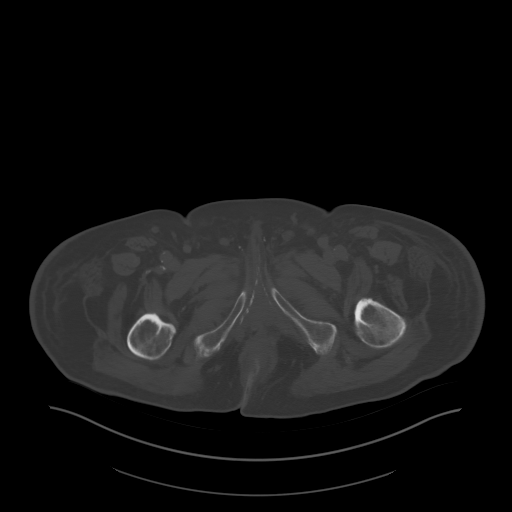
[im 13/107  soft-tissue]
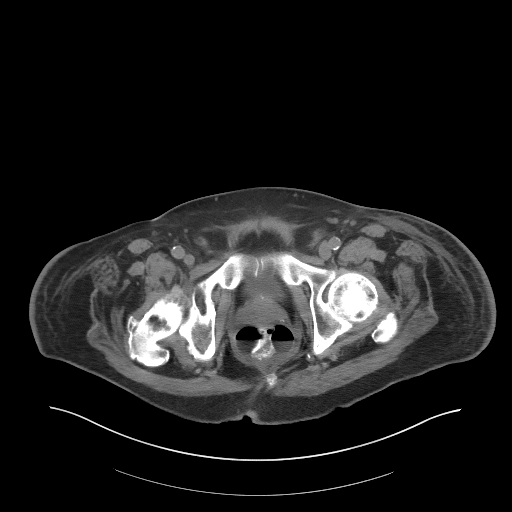
[im 22/107  soft-tissue]
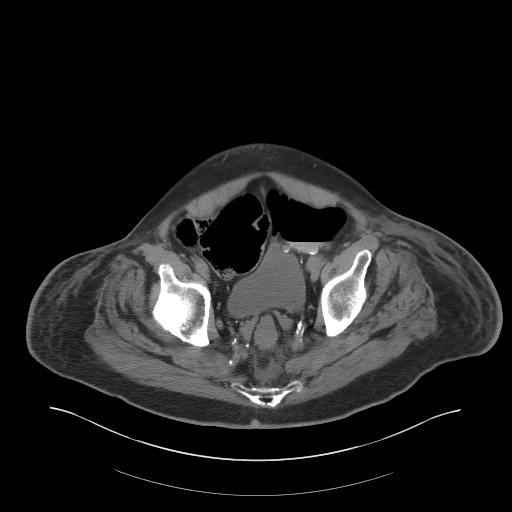
[im 30/107  soft-tissue]
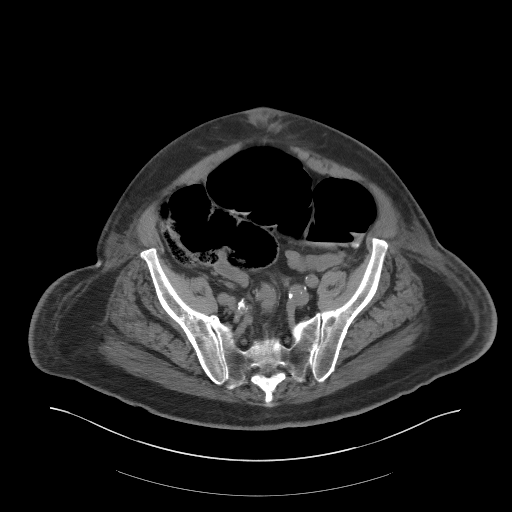
[im 39/107  soft-tissue]
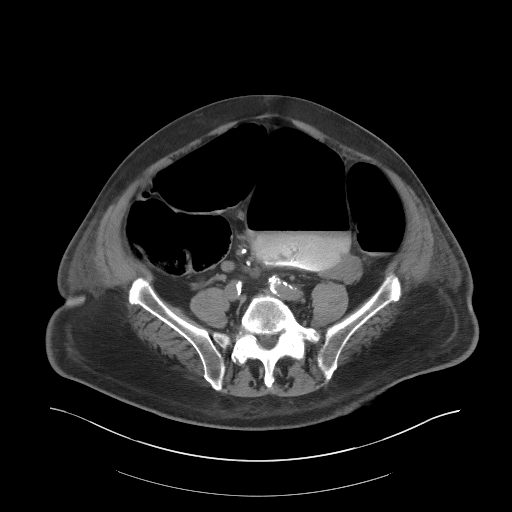
[im 47/107  soft-tissue]
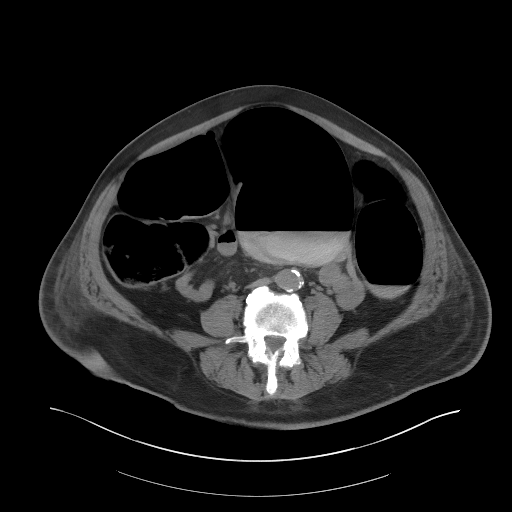
[im 56/107  soft-tissue]
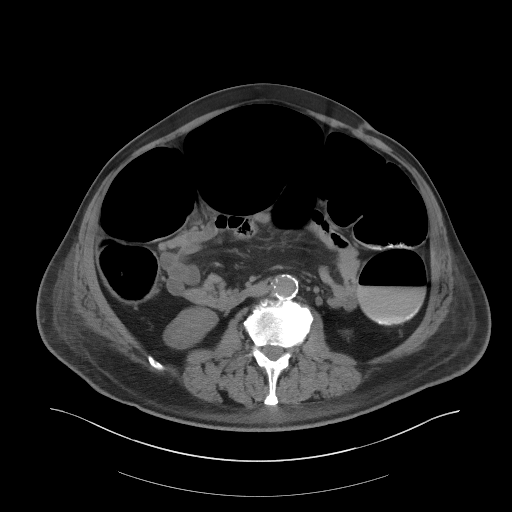
[im 60/107  soft-tissue]
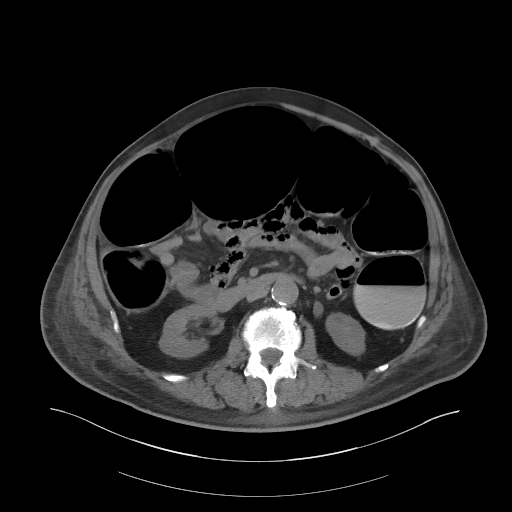
[im 68/107  soft-tissue]
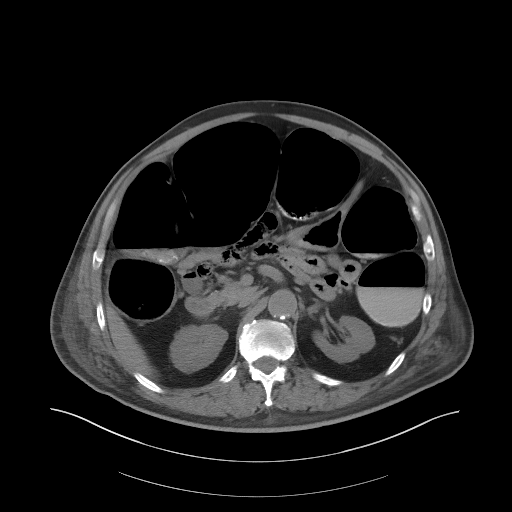
[im 68/107  bone]
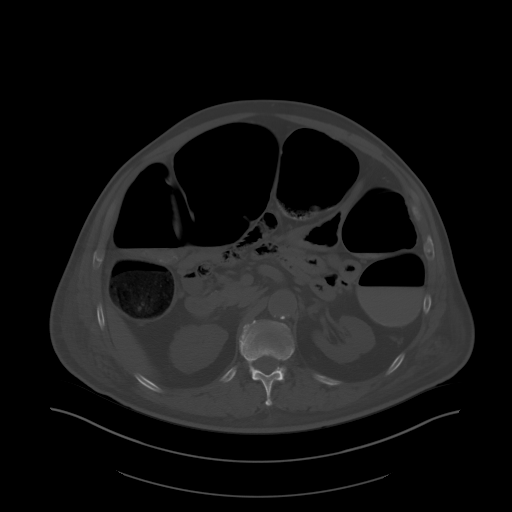
[im 77/107  soft-tissue]
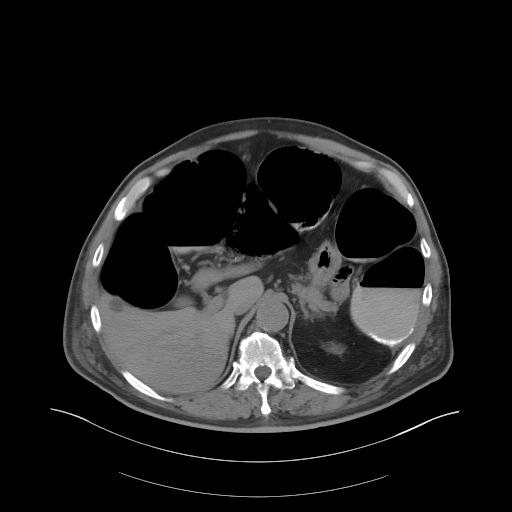
[im 85/107  soft-tissue]
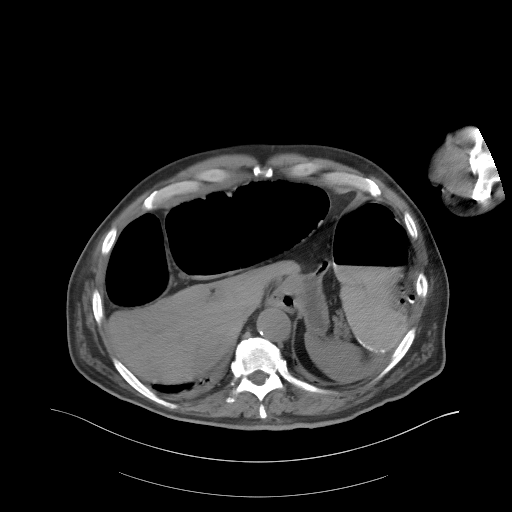
[im 94/107  soft-tissue]
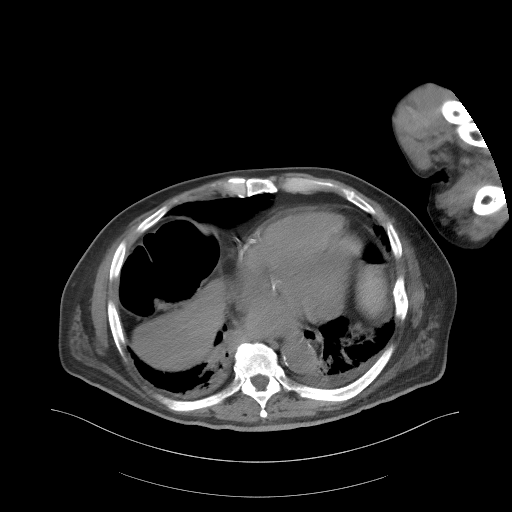
[im 102/107  soft-tissue]
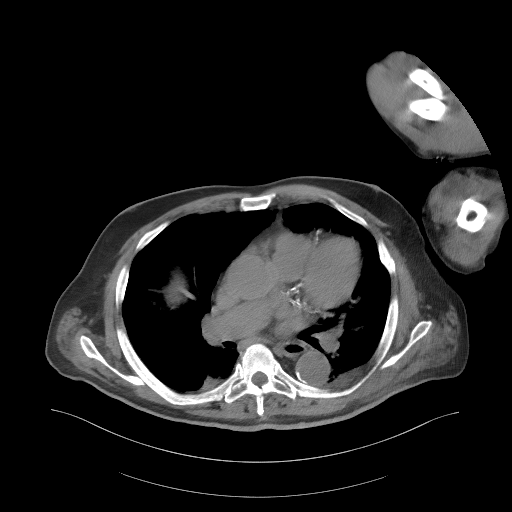

[Series 5: coronal st · coronal · 0.89mm/px · 3 of 120 slices shown]
[im 40/120  soft-tissue]
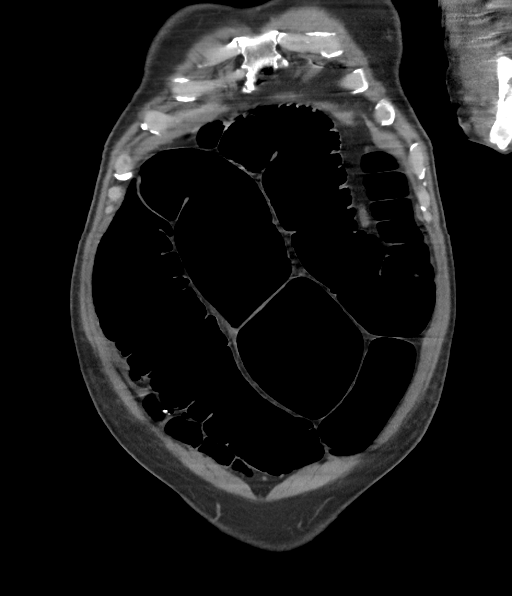
[im 53/120  soft-tissue]
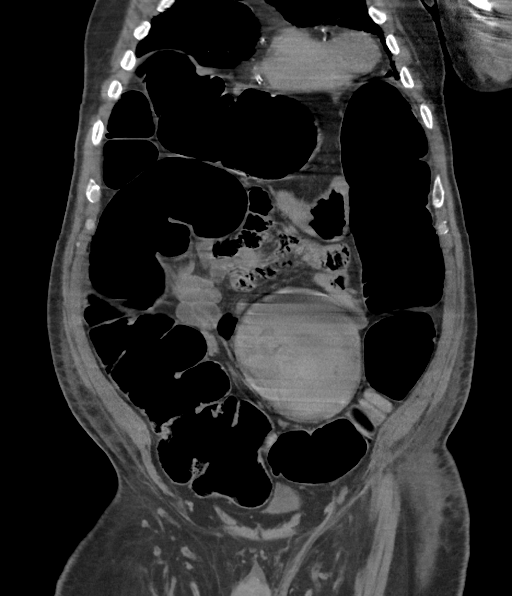
[im 67/120  soft-tissue]
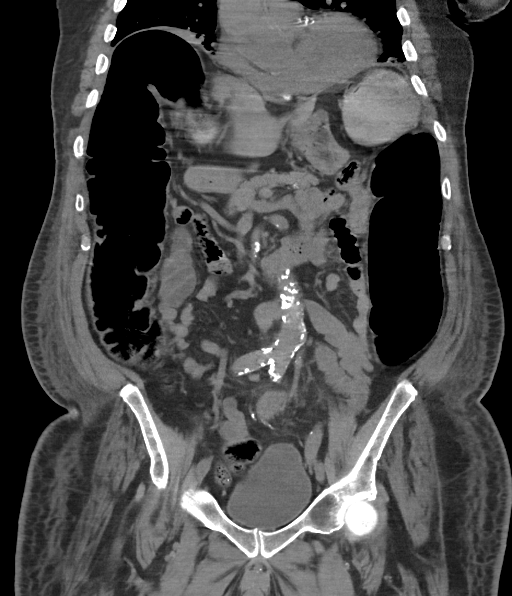

[16 of 46 positions shown; findings below may reference images not displayed]

FINDINGS: Lower chest: Mild bibasilar atelectasis and small bilateral
effusions.

Cardiac enlargement.  Extensive coronary calcification.

Hepatobiliary: Contracted gallbladder with multiple gallstones. No
gallbladder wall thickening or biliary dilatation. 15 mm hepatic
cyst on the right.

Pancreas: Negative

Spleen: Negative

Adrenals/Urinary Tract: Negative for renal mass or obstruction.
Small bilateral renal calculi. Negative urinary bladder.

Stomach/Bowel: Severe colonic dilatation with multiple air-fluid
levels. Transition point in the rectosigmoid compatible with sigmoid
volvulus. This is the same location with a similar appearance to the
CT of 10/21/2016. Rectal catheter is in place. Rectum decompressed.
Layering oral contrast is seen in the colon, from recent swallowing
function study.

Small hiatal hernia.  Negative for small bowel dilatation.

Vascular/Lymphatic: Extensive atherosclerotic disease. No aortic
aneurysm. Negative for lymphadenopathy.

Reproductive: Normal prostate

Other: Mild presacral soft tissue edema, increased from the prior
study. Negative for abscess.

Musculoskeletal: Disc degeneration and spondylosis in the lower
lumbar spine. No acute skeletal abnormality.
IMPRESSION: Severe colonic dilatation compatible with obstruction due to sigmoid
volvulus. This appearance is similar to the CT of 10/21/2016.

Cholelithiasis

Atherosclerotic disease.  Extensive coronary artery calcification

Small hiatal hernia

Small bilateral renal calculi without renal obstruction.

## 2017-08-26 IMAGING — DX DG ABDOMEN ACUTE W/ 1V CHEST
4 series · 4 of 4 positions shown · non-contrast
Comparison: Abdominal radiograph 10/28/2016, abdominal CT
10/27/2016

CLINICAL DATA: Abdominal pain for 2 days.  Abdominal distension.

EXAM:
DG ABDOMEN ACUTE W/ 1V CHEST

[abdomen erect]
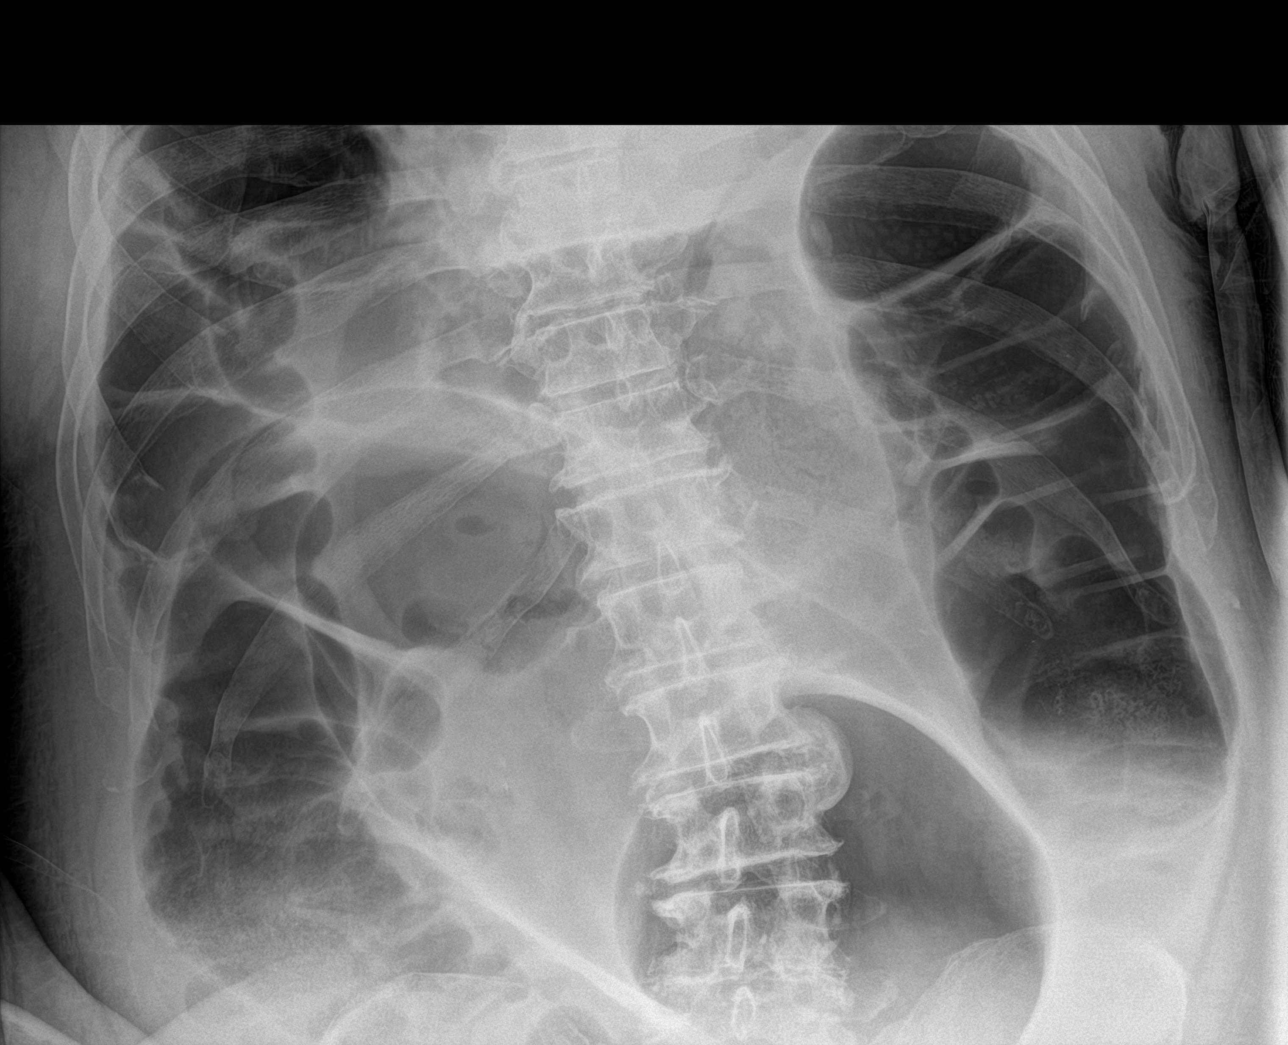

[abdomen supine (1 of 2)]
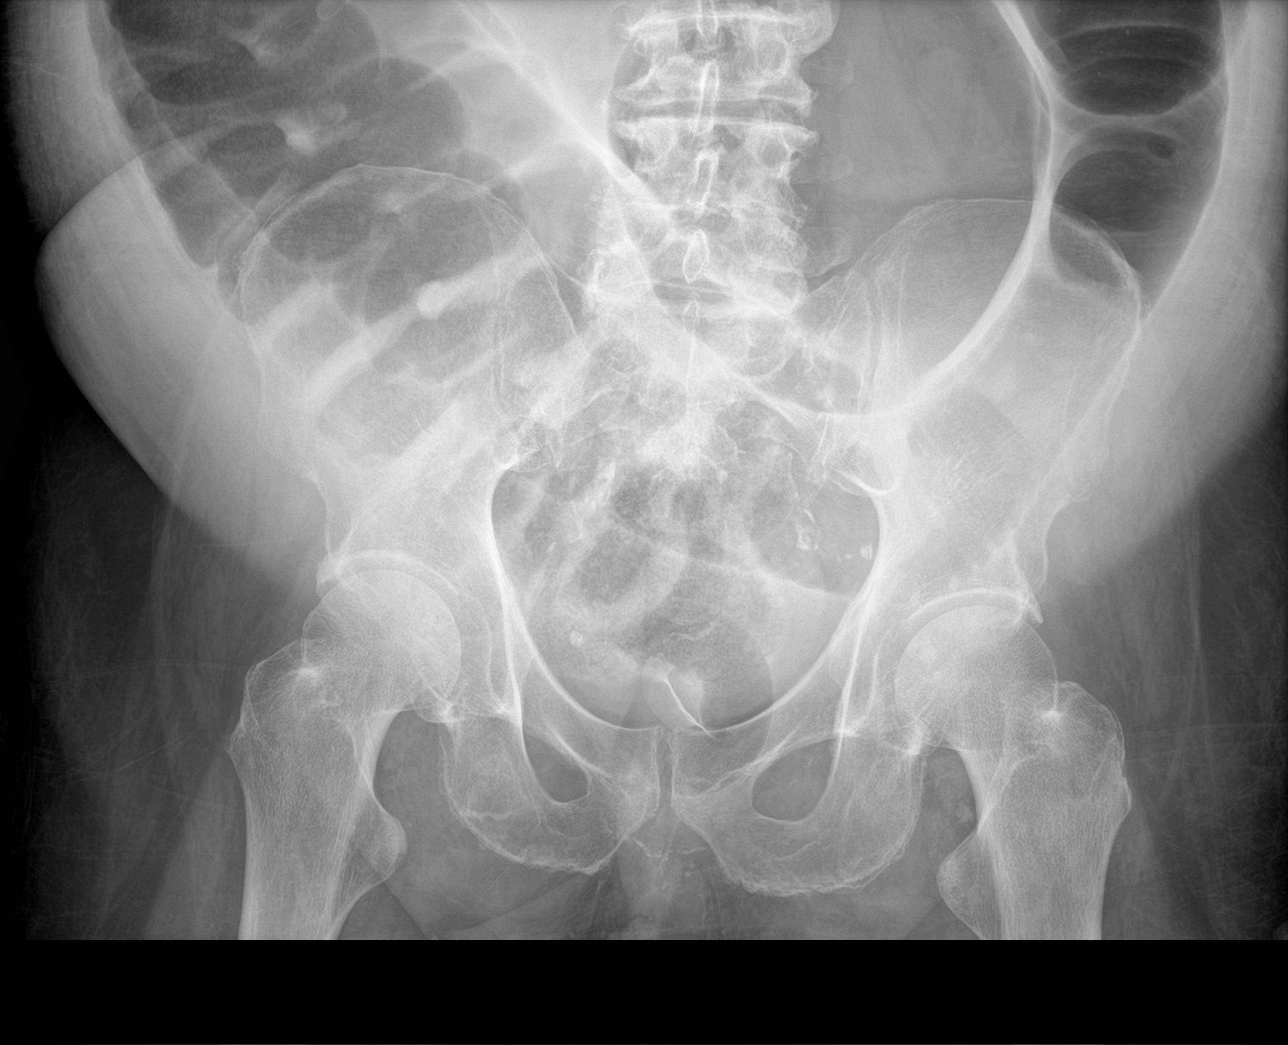

[abdomen supine (2 of 2)]
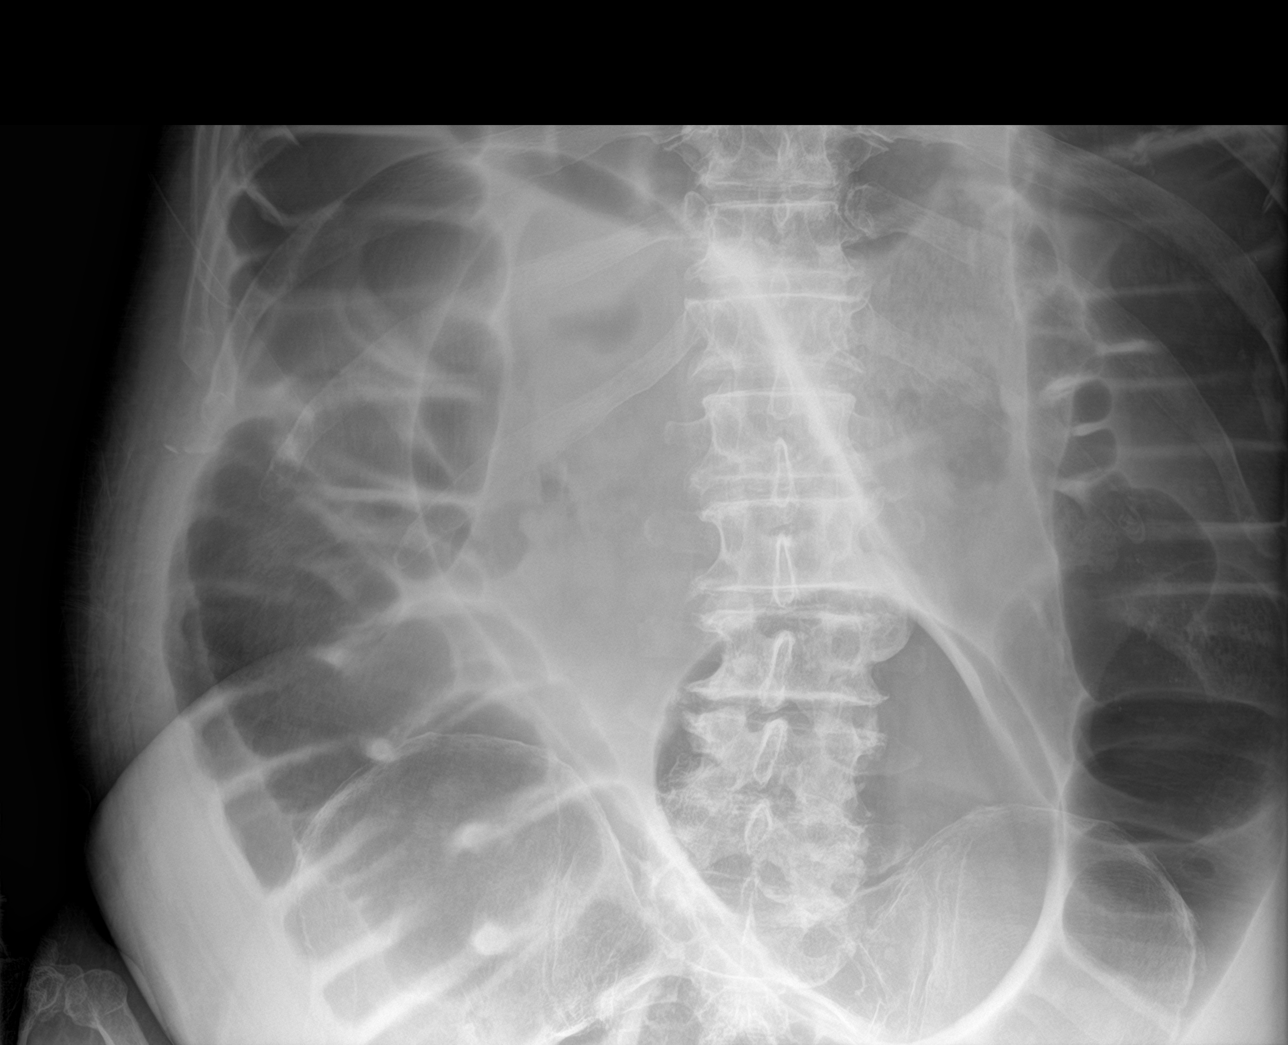

[chest ap]
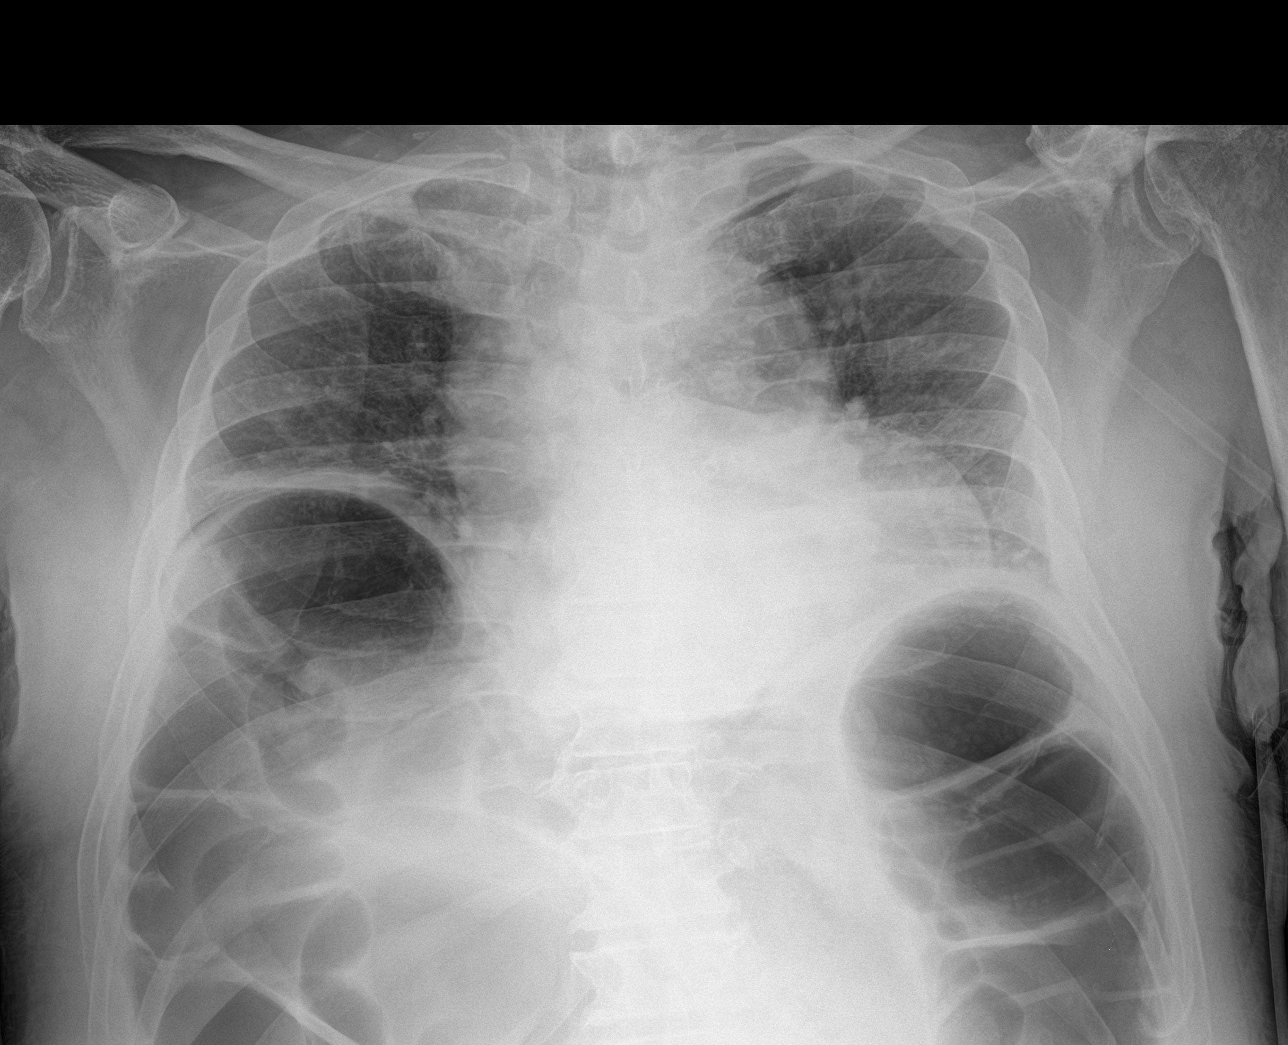

[4 of 4 positions shown; findings below may reference images not displayed]

FINDINGS: Very low lung volumes with bibasilar atelectasis. Heart size
accentuated by low lung volumes. There is tortuosity of the thoracic
aorta. No large pleural effusion.

Diffuse gas just distention of colon, increased colonic distention
from prior exam. Sigmoid distention is similar to prior exam. No
evidence of free air. No small bowel dilatation. No radiopaque
calculi.

No acute osseous abnormalities are seen.
IMPRESSION: Progressive proximal colonic distention, significant sigmoid
distention is similar. Bowel gas pattern appears similar to CT
10/27/2016. No evidence of free air.

Low lung volumes with bibasilar atelectasis.

## 2017-08-29 ENCOUNTER — Encounter (HOSPITAL_COMMUNITY)
Admission: RE | Admit: 2017-08-29 | Discharge: 2017-08-29 | Disposition: A | Discharge status: Home / Self Care | Payer: Medicare Other | Source: Skilled Nursing Facility | Attending: *Deleted | Admitting: *Deleted

## 2017-08-29 DIAGNOSIS — Z7901 Long term (current) use of anticoagulants: Secondary | ICD-10-CM | POA: Diagnosis not present

## 2017-08-29 DIAGNOSIS — I10 Essential (primary) hypertension: Secondary | ICD-10-CM | POA: Diagnosis not present

## 2017-08-29 DIAGNOSIS — G8191 Hemiplegia, unspecified affecting right dominant side: Secondary | ICD-10-CM | POA: Diagnosis not present

## 2017-08-29 DIAGNOSIS — R293 Abnormal posture: Secondary | ICD-10-CM | POA: Diagnosis not present

## 2017-08-29 DIAGNOSIS — I69998 Other sequelae following unspecified cerebrovascular disease: Secondary | ICD-10-CM | POA: Diagnosis not present

## 2017-08-29 LAB — PROTIME-INR
INR: 2.39
Prothrombin Time: 25.8 seconds — ABNORMAL HIGH (ref 11.4–15.2)

## 2017-09-12 ENCOUNTER — Encounter: Payer: Self-pay | Admitting: Internal Medicine

## 2017-09-12 ENCOUNTER — Non-Acute Institutional Stay (SKILLED_NURSING_FACILITY): Payer: Medicare Other | Admitting: Internal Medicine

## 2017-09-12 ENCOUNTER — Encounter (HOSPITAL_COMMUNITY)
Admission: RE | Admit: 2017-09-12 | Discharge: 2017-09-12 | Disposition: A | Discharge status: Home / Self Care | Payer: Medicare Other | Source: Skilled Nursing Facility | Attending: Internal Medicine | Admitting: Internal Medicine

## 2017-09-12 DIAGNOSIS — R293 Abnormal posture: Secondary | ICD-10-CM | POA: Diagnosis not present

## 2017-09-12 DIAGNOSIS — J410 Simple chronic bronchitis: Secondary | ICD-10-CM | POA: Diagnosis not present

## 2017-09-12 DIAGNOSIS — I639 Cerebral infarction, unspecified: Secondary | ICD-10-CM

## 2017-09-12 DIAGNOSIS — E0590 Thyrotoxicosis, unspecified without thyrotoxic crisis or storm: Secondary | ICD-10-CM | POA: Diagnosis not present

## 2017-09-12 DIAGNOSIS — G8191 Hemiplegia, unspecified affecting right dominant side: Secondary | ICD-10-CM | POA: Diagnosis not present

## 2017-09-12 DIAGNOSIS — I10 Essential (primary) hypertension: Secondary | ICD-10-CM

## 2017-09-12 DIAGNOSIS — I69998 Other sequelae following unspecified cerebrovascular disease: Secondary | ICD-10-CM | POA: Diagnosis not present

## 2017-09-12 DIAGNOSIS — Z7901 Long term (current) use of anticoagulants: Secondary | ICD-10-CM | POA: Diagnosis not present

## 2017-09-12 DIAGNOSIS — I2782 Chronic pulmonary embolism: Secondary | ICD-10-CM | POA: Diagnosis not present

## 2017-09-12 LAB — PROTIME-INR
INR: 1.95
Prothrombin Time: 22 seconds — ABNORMAL HIGH (ref 11.4–15.2)

## 2017-09-12 NOTE — Progress Notes (Signed)
Location:   [facility] Room Number: 112/W Place of Service:  SNF [phone]) Provider:  Pollyann Savoy, MD  [name], [name]  Extended Emergency Contact Information Primary Emergency Contact: [name] Address: [address]          [place], [place] [address] [place] Home Phone: [phone] Mobile Phone: [phone] Relation: Brother  Code Status:  Full Code Goals of care: Advanced Directive information Advanced Directives 09/12/2017  Does Patient Have a Medical Advance Directive? Yes  Type of Advance Directive (No Data)  Does patient want to make changes to medical advance directive? No - Patient declined  Copy of Healthcare Power of Attorney in Chart? No - copy requested  Would patient like information on creating a medical advance directive? No - Patient declined     Chief Complaint  Patient presents with  . Medical Management of Chronic Issues    Routine Visit    HPI:  Pt is a 72 y.o. male seen today for medical management of chronic diseases.    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 And Left Corneal Abrasion  Patient is doing well in Facility. He is seeing Opthalmology for Corneal Abrasion. They did decide agist Permanent stitching of his eyes to prevent worsening Eye Infection He is on Chronic Erythromycin.Eye Ointment Otherwise he is doing well. Eating well ;. Denies any Cough or Chest pain. His weight is stable at 180-184 lbs. No New Nursing Issues.     Past Medical History:  Diagnosis Date  . Dysphasia   . Fatty liver   . Hemiplegia (HCC)   . Hypertension   . Pneumonia   . Pulmonary embolism (HCC)   . 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(HCC)   . Stroke [facility])    Past Surgical History:  Procedure Laterality Date  . FLEXIBLE  SIGMOIDOSCOPY N/A 10/21/2016   Procedure: FLEXIBLE SIGMOIDOSCOPY;  Surgeon: Malissa Hippo, MD;  Location: [facility];  Service: Endoscopy;  Laterality: N/A;  . FLEXIBLE SIGMOIDOSCOPY N/A 10/25/2016   Procedure: FLEXIBLE SIGMOIDOSCOPY;  Surgeon: Malissa Hippo, MD;  Location: [facility];  Service: Endoscopy;  Laterality: N/A;  . unable      Allergies  Allergen Reactions  . Penicillins     Has patient had a PCN reaction causing immediate rash, facial/tongue/throat swelling, SOB or lightheadedness with hypotension: unknown Has patient had a PCN reaction causing severe rash involving mucus membranes or skin necrosis: unknown Has patient had a PCN reaction that required hospitalization: unknown Has patient had a PCN reaction occurring within the last 10 years: unknown If all of the above answers are "NO", then may proceed with Cephalosporin use. 5/3 Tolerated Cefepime x 1 dose in ED.     Outpatient Encounter Medications as of 09/12/2017  Medication Sig  . acetaminophen (TYLENOL) 325 MG tablet Take 650 mg by mouth every 6 (six) hours as needed for moderate pain.   Marland Kitchen betamethasone dipropionate (DIPROLENE) 0.05 % cream Apply to legs and hands. Do not apply to face groin, axilla. Try Cerave first twice a day.  . budesonide-formoterol (SYMBICORT) 80-4.5 MCG/ACT inhaler Inhale 2 puffs into the lungs 2 (two) times daily.  . carboxymethylcellulose (REFRESH PLUS) 0.5 % SOLN Place 1 drop into both eyes 3 (three) times daily.   . Emollient (CERAVE) CREA Apply 1 application topically  daily as needed for itching once a day  . erythromycin ophthalmic ointment appy 1 ribbon ophthalmic (eye) 5 times a day  . Eyelid Cleansers (SYSTANE LID WIPES EX) Apply 1 application topically 2 (two) times daily. Start occusoft lid scrubs: clean eyelids of oil and debris BID OU ( each eye ) before administering eye drops.   Marland Kitchen. ipratropium (ATROVENT) 0.02 % nebulizer solution Take 0.5 mg by nebulization every 6 (six)  hours.  Marland Kitchen. labetalol (NORMODYNE) 200 MG tablet Take 200 mg by mouth daily. For HTN  . loratadine (CLARITIN) 10 MG tablet Take 10 mg by mouth daily.  . methimazole (TAPAZOLE) 5 MG tablet Take 1 tablet (5 mg total) by mouth daily.  . polyethylene glycol (MIRALAX / GLYCOLAX) packet Take 17 g by mouth at bedtime.  . potassium chloride (K-DUR,KLOR-CON) 10 MEQ tablet Take 40 mEq by mouth daily.   . sennosides-docusate sodium (SENOKOT-S) 8.6-50 MG tablet Take 1 tablet by mouth at bedtime. For constipation  . tiotropium (SPIRIVA HANDIHALER) 18 MCG inhalation capsule Place 18 mcg into inhaler and inhale daily.  Marland Kitchen. warfarin (COUMADIN) 1 MG tablet Take 1/2 of ( 1 mg ) tablet by mouth along with 3 mg to = 3.5 mg once a day on Sun., Mon., Wed., Fri., Sat.  . warfarin (COUMADIN) 3 MG tablet Take 3 mg tablet by mouth along with 1/2 tablet of (1 mg) to = 3.5 mg once a day on Sun., Mon., Wed., Fri., Sat.  . warfarin (COUMADIN) 4 MG tablet Take 4 mg tablet by mouth once a day on Tues., Thus.,  . [DISCONTINUED] enoxaparin (LOVENOX) 40 MG/0.4ML injection Inject 40 mg into the skin 2 (two) times daily. From 08/10/2017-08/14/2017 PRE/POST surgery  . [DISCONTINUED] enoxaparin (LOVENOX) 40 MG/0.4ML injection Inject 40 mg into the skin 2 (two) times daily. MD to D/C when INR is therapeutic start date 08/16/2017  . [DISCONTINUED] White Petrolatum-Mineral Oil (SYSTANE NIGHTTIME) OINT Apply line to left eye at bedtime   No facility-administered encounter medications on file as of 09/12/2017.      Review of Systems  Review of Systems  Constitutional: Negative for activity change, appetite change, chills, diaphoresis, fatigue and fever.  HENT: Negative for mouth sores, postnasal drip, rhinorrhea, sinus pain and sore throat.   Respiratory: Negative for apnea, cough, chest tightness, shortness of breath and wheezing.   Cardiovascular: Negative for chest pain, palpitations and leg swelling.  Gastrointestinal: Negative for  abdominal distention, abdominal pain, constipation, diarrhea, nausea and vomiting.  Genitourinary: Negative for dysuria and frequency.  Musculoskeletal: Negative for arthralgias, joint swelling and myalgias.  Skin: Negative for rash.  Neurological: Negative for dizziness, syncope, weakness, light-headedness and numbness.  Psychiatric/Behavioral: Negative for behavioral problems, confusion and sleep disturbance.    Immunization History  Administered Date(s) Administered  . Influenza-Unspecified 03/28/2014, 03/23/2016, 03/25/2017  . Pneumococcal Conjugate-13 04/21/2015  . Pneumococcal-Unspecified 11/01/2009, 03/30/2016  . Tdap 03/17/2017   Pertinent  Health Maintenance Due  Topic Date Due  . COLONOSCOPY  10/30/2025 (Originally 11/08/1995)  . INFLUENZA VACCINE  Completed  . PNA vac Low Risk Adult  Completed   Fall Risk  04/05/2017 03/17/2017  Falls in the past year? No No  Risk for fall due to : Impaired balance/gait;Impaired mobility;Medication side effect;Impaired vision;History of fall(s) -   Functional Status Survey:    Vitals:   09/12/17 1222  BP: 134/76  Pulse: 82  Resp: 20  Temp: 97.8 F (36.6 C)  TempSrc: Oral  SpO2: 95%  Weight: 184 lb (83.5 kg)  Height: 5\' 8"  (1.727 m)   Body mass index is 27.98 kg/m. Physical Exam  Constitutional: He is oriented to person, place, and time. He appears well-developed and well-nourished.  HENT:  Head: Normocephalic.  Eyes: EOM are normal. Left conjunctiva is injected.  Neck: Neck supple.  Cardiovascular: Normal rate and normal heart sounds.  Pulmonary/Chest: Effort normal and breath sounds normal. No respiratory distress. He has no wheezes. He has no rales.  Abdominal: Soft. Bowel sounds are normal. He exhibits no distension. There is no tenderness. There is no rebound.  Musculoskeletal: He exhibits edema.  Chronic Venous stasis Changes  Lymphadenopathy:    He has no cervical adenopathy.  Neurological: He is alert and  oriented to person, place, and time.  Right Hemiparesis.  Skin: Skin is warm and dry.  Psychiatric: He has a normal mood and affect. His behavior is normal.  Nursing note and vitals reviewed.   Labs reviewed: Recent Labs    10/29/16 0412  11/01/16 0700  11/04/16 0438  03/09/17 0711 04/06/17 0701 07/19/17 0709  NA 147*   < > 146*   < > 143   < > 139 137 139  K 2.6*   < > 2.8*   < > 2.9*   < > 4.0 4.1 4.2  CL 115*   < > 110   < > 107   < > 105 100* 102  CO2 28   < > 29   < > 31   < > 26 28 24   GLUCOSE 96   < > 82   < > 90   < > 84 93 80  BUN 9   < > 14   < > 12   < > 24* 21* 20  CREATININE 0.89   < > 0.85   < > 0.86   < > 0.99 1.03 1.02  CALCIUM 7.9*   < > 8.1*   < > 7.9*   < > 8.7* 8.8* 8.9  MG 1.6*  --  1.8  --  1.6*  --   --   --   --    < > = values in this interval not displayed.   Recent Labs    11/03/16 0455 12/21/16 0500 07/19/17 0709  AST 26 23 25   ALT 42 29 29  ALKPHOS 51 58 79  BILITOT 0.4 0.4 0.7  PROT 6.4* 7.0 7.3  ALBUMIN 2.9* 3.2* 3.3*   Recent Labs    12/21/16 0500 01/06/17 0715 03/09/17 0711 07/19/17 0709  WBC 7.8  --  7.7 7.5  NEUTROABS 4.2  --  4.5 4.1  HGB 13.9  --  15.1 15.5  HCT 44.1 44.5 46.2 48.6  MCV 93.4  --  91.5 93.8  PLT 280  --  269 297   Lab Results  Component Value Date   TSH 0.000 (L) 06/15/2017   Lab Results  Component Value Date   HGBA1C  03/27/2009    6.0 (NOTE) The ADA recommends the following therapeutic goal for glycemic control related to Hgb A1c measurement: Goal of therapy: <6.5 Hgb A1c  Reference: American Diabetes Association: Clinical Practice Recommendations 2010, Diabetes Care, 2010, 33: (Suppl  1).   Lab Results  Component Value Date   CHOL 134 07/19/2017   HDL 39 (L) 07/19/2017   LDLCALC 85 07/19/2017   TRIG 52 07/19/2017   CHOLHDL 3.4 07/19/2017    Significant Diagnostic Results in last 30 days:  No results found.  Assessment/Plan  COPD Patient  on Spiriva and Symbicort Stable with no  Symptoms.  Essential hypertension On Labetolol. Lisinopril was discontinued due to Hypotension Corneal Abrasion On Erythromycin Ointment  follows with Opthamology  Recurrent PE and chronic DVT On Coumadin INR is 1.95 today His dose was adjusted. Follow INR  Sigmoid volvulusresolved. Continue on Miralax  Subclinical hyperthyroidism Follow Up With Endocrinology On Tapazole  Hypokalemia On Potassium supplement  Will discontinue Potassium now that his Volvulus is resolved and repeat BMP  S/P CVA Patient stable. BP controlled. He is now on Dyphagic Diet and doing well. Hyperlipidemia Will start him on Low dose of Lipitor . Due to his h/o CVA his LDL should be less then 70 LDL was 85 in 01/19  Family/ staff Communication:   Labs/tests ordered:   Total time spent in this patient care encounter was 25_ minutes; greater than 50% of the visit spent counseling patient, reviewing records , Labs and coordinating care for problems addressed at this encounter.

## 2017-09-13 ENCOUNTER — Encounter (HOSPITAL_COMMUNITY)
Admission: RE | Admit: 2017-09-13 | Discharge: 2017-09-13 | Disposition: A | Discharge status: Home / Self Care | Payer: Medicare Other | Source: Skilled Nursing Facility | Attending: Internal Medicine | Admitting: Internal Medicine

## 2017-09-13 DIAGNOSIS — I69328 Other speech and language deficits following cerebral infarction: Secondary | ICD-10-CM | POA: Insufficient documentation

## 2017-09-13 DIAGNOSIS — R1312 Dysphagia, oropharyngeal phase: Secondary | ICD-10-CM | POA: Insufficient documentation

## 2017-09-13 DIAGNOSIS — M6281 Muscle weakness (generalized): Secondary | ICD-10-CM | POA: Diagnosis not present

## 2017-09-13 DIAGNOSIS — R293 Abnormal posture: Secondary | ICD-10-CM | POA: Insufficient documentation

## 2017-09-13 DIAGNOSIS — R279 Unspecified lack of coordination: Secondary | ICD-10-CM | POA: Diagnosis not present

## 2017-09-13 DIAGNOSIS — I10 Essential (primary) hypertension: Secondary | ICD-10-CM | POA: Insufficient documentation

## 2017-09-13 LAB — T4, FREE: FREE T4: 0.74 ng/dL (ref 0.61–1.12)

## 2017-09-13 LAB — TSH: TSH: 0.969 u[IU]/mL (ref 0.350–4.500)

## 2017-09-14 LAB — T3: T3 TOTAL: 82 ng/dL (ref 71–180)

## 2017-09-14 LAB — T3, FREE: T3 FREE: 2 pg/mL (ref 2.0–4.4)

## 2017-09-14 LAB — T4: T4, Total: 5.7 ug/dL (ref 4.5–12.0)

## 2017-09-19 ENCOUNTER — Encounter (HOSPITAL_COMMUNITY)
Admission: RE | Admit: 2017-09-19 | Discharge: 2017-09-19 | Disposition: A | Discharge status: Home / Self Care | Payer: Medicare Other | Source: Skilled Nursing Facility | Attending: Internal Medicine | Admitting: Internal Medicine

## 2017-09-19 DIAGNOSIS — M6281 Muscle weakness (generalized): Secondary | ICD-10-CM | POA: Diagnosis not present

## 2017-09-19 DIAGNOSIS — K562 Volvulus: Secondary | ICD-10-CM | POA: Diagnosis not present

## 2017-09-19 DIAGNOSIS — I10 Essential (primary) hypertension: Secondary | ICD-10-CM | POA: Diagnosis not present

## 2017-09-19 DIAGNOSIS — R1312 Dysphagia, oropharyngeal phase: Secondary | ICD-10-CM | POA: Insufficient documentation

## 2017-09-19 DIAGNOSIS — I69328 Other speech and language deficits following cerebral infarction: Secondary | ICD-10-CM | POA: Diagnosis not present

## 2017-09-19 LAB — BASIC METABOLIC PANEL
Anion gap: 10 (ref 5–15)
BUN: 19 mg/dL (ref 6–20)
CHLORIDE: 103 mmol/L (ref 101–111)
CO2: 26 mmol/L (ref 22–32)
CREATININE: 1.11 mg/dL (ref 0.61–1.24)
Calcium: 8.7 mg/dL — ABNORMAL LOW (ref 8.9–10.3)
GFR calc Af Amer: >60 mL/min (ref >60)
GFR calc non Af Amer: >60 mL/min (ref >60)
Glucose, Bld: 78 mg/dL (ref 65–99)
POTASSIUM: 3.9 mmol/L (ref 3.5–5.1)
Sodium: 139 mmol/L (ref 135–145)

## 2017-09-19 LAB — PROTIME-INR
INR: 2.04
Prothrombin Time: 22.9 seconds — ABNORMAL HIGH (ref 11.4–15.2)

## 2017-09-20 ENCOUNTER — Encounter: Payer: Self-pay | Admitting: "Endocrinology

## 2017-09-20 ENCOUNTER — Ambulatory Visit (INDEPENDENT_AMBULATORY_CARE_PROVIDER_SITE_OTHER): Payer: Medicare Other | Admitting: "Endocrinology

## 2017-09-20 VITALS — BP 152/82 | HR 81 | Ht 70.0 in

## 2017-09-20 DIAGNOSIS — E0590 Thyrotoxicosis, unspecified without thyrotoxic crisis or storm: Secondary | ICD-10-CM

## 2017-09-20 DIAGNOSIS — I639 Cerebral infarction, unspecified: Secondary | ICD-10-CM | POA: Diagnosis not present

## 2017-09-20 NOTE — Progress Notes (Signed)
Subjective:    Patient ID: [name] [name], male    DOB: 1946/01/11, PCP Mahlon Gammon, MD   Past Medical History:  Diagnosis Date  . Dysphasia   . Fatty liver   . Hemiplegia (HCC)   . Hypertension   . Pneumonia   . Pulmonary embolism (HCC)   . 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(HCC)   . Stroke [facility])    Past Surgical History:  Procedure Laterality Date  . FLEXIBLE SIGMOIDOSCOPY N/A 10/21/2016   Procedure: FLEXIBLE SIGMOIDOSCOPY;  Surgeon: Malissa Hippo, MD;  [facility];  Service: Endoscopy;  Laterality: N/A;  . FLEXIBLE SIGMOIDOSCOPY N/A 10/25/2016   Procedure: FLEXIBLE SIGMOIDOSCOPY;  Surgeon: Malissa Hippo, MD;  Location: [facility];  Service: Endoscopy;  Laterality: N/A;  . unable     Social History   Socioeconomic History  . Marital status: Single    Spouse name: Not on file  . Number of children: Not on file  . Years of education: Not on file  . Highest education level: Not on file  Occupational History  . Not on file  Social Needs  . Financial resource strain: Not on file  . Food insecurity:    Worry: Not on file    Inability: Not on file  . Transportation needs:    Medical: Not on file    Non-medical: Not on file  Tobacco Use  . Smoking status: Never Smoker  . Smokeless tobacco: Never Used  Substance and Sexual Activity  . Alcohol use: No    Alcohol/week: 0.0 oz  . Drug use: No  . Sexual activity: Not on file  Lifestyle  . Physical activity:    Days per week: Not on file    Minutes per session: Not on file  . Stress: Not on file  Relationships  . Social connections:    Talks on phone: Not on file    Gets together: Not on file    Attends religious service: Not on file    Active member of club or organization: Not on file    Attends meetings of clubs or organizations: Not on file    Relationship status: Not on file  Other Topics Concern  . Not on file  Social History Narrative  . Not on file   Outpatient Encounter Medications as of  09/20/2017  Medication Sig  . acetaminophen (TYLENOL) 325 MG tablet Take 650 mg by mouth every 6 (six) hours as needed for moderate pain.   Marland Kitchen atorvastatin (LIPITOR) 10 MG tablet Take 10 mg by mouth daily.  . betamethasone dipropionate (DIPROLENE) 0.05 % cream Apply to legs and hands. Do not apply to face groin, axilla. Try Cerave first twice a day.  . budesonide-formoterol (SYMBICORT) 80-4.5 MCG/ACT inhaler Inhale 2 puffs into the lungs 2 (two) times daily.  . carboxymethylcellulose (REFRESH PLUS) 0.5 % SOLN Place 1 drop into both eyes 3 (three) times daily.   . Emollient (CERAVE) CREA Apply 1 application topically daily as needed for itching once a day  . erythromycin ophthalmic ointment appy 1 ribbon ophthalmic (eye) 5 times a day  . ipratropium (ATROVENT) 0.02 % nebulizer solution Take 0.5 mg by nebulization every 6 (six) hours.  Marland Kitchen labetalol (NORMODYNE) 200 MG tablet Take 200 mg by mouth daily. For HTN  . loratadine (CLARITIN) 10 MG tablet Take 10 mg by mouth daily.  . methimazole (TAPAZOLE) 5 MG tablet Take 5 mg by mouth daily.  . polyethylene glycol (MIRALAX / GLYCOLAX) packet  Take 17 g by mouth at bedtime.  Marland Kitchen. tiotropium (SPIRIVA HANDIHALER) 18 MCG inhalation capsule Place 18 mcg into inhaler and inhale daily.  Marland Kitchen. warfarin (COUMADIN) 1 MG tablet Take 1/2 of ( 1 mg ) tablet by mouth along with 3 mg to = 3.5 mg once a day on Sun., Mon., Wed., Fri., Sat.  . warfarin (COUMADIN) 3 MG tablet Take 3 mg tablet by mouth along with 1/2 tablet of (1 mg) to = 3.5 mg once a day on Sun., Mon., Wed., Fri., Sat.  . warfarin (COUMADIN) 4 MG tablet Take 4 mg tablet by mouth once a day on Tues., Thus.,  . Eyelid Cleansers (SYSTANE LID WIPES EX) Apply 1 application topically 2 (two) times daily. Start occusoft lid scrubs: clean eyelids of oil and debris BID OU ( each eye ) before administering eye drops.   . potassium chloride (K-DUR,KLOR-CON) 10 MEQ tablet Take 40 mEq by mouth daily.   . sennosides-docusate  sodium (SENOKOT-S) 8.6-50 MG tablet Take 1 tablet by mouth at bedtime. For constipation  . [DISCONTINUED] methimazole (TAPAZOLE) 5 MG tablet Take 1 tablet (5 mg total) by mouth daily.   No facility-administered encounter medications on file as of 09/20/2017.    ALLERGIES: Allergies  Allergen Reactions  . Penicillins     Has patient had a PCN reaction causing immediate rash, facial/tongue/throat swelling, SOB or lightheadedness with hypotension: unknown Has patient had a PCN reaction causing severe rash involving mucus membranes or skin necrosis: unknown Has patient had a PCN reaction that required hospitalization: unknown Has patient had a PCN reaction occurring within the last 10 years: unknown If all of the above answers are "NO", then may proceed with Cephalosporin use. 5/3 Tolerated Cefepime x 1 dose in ED.     VACCINATION STATUS: Immunization History  Administered Date(s) Administered  . Influenza-Unspecified 03/28/2014, 03/23/2016, 03/25/2017  . Pneumococcal Conjugate-13 04/21/2015  . Pneumococcal-Unspecified 11/01/2009, 03/30/2016  . Tdap 03/17/2017    HPI [name] [name] is 72 y.o. male who presents today with a medical history as above. - He was initiated on low-dose methimazole 5 mg by mouth daily for hyperthyroidism.  He is responding to this treatment very well.    -  He has multiple medical problems including stroke with right-sided hemiplegia, nursing home resident. - He also has dysphagia, was found to be losing weight, currently reversing Amma he has gained 4 pounds since last visit- a good development for him.  - He was sent for thyroid uptake and scan which showed nonfocal elevated uptake of 42%.  He is unable to provide history, accompanied by his brother not aware of the details of his medical problems. he has been dealing with symptoms of  loss of weight, sleep disturbance. 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   Review of Systems  Constitutional: + weight gain + fatigue, no subjective hyperthermia, no subjective hypothermia, + wheelchair-bound.  Eyes: no blurry vision, no xerophthalmia ENT: no sore throat, no nodules palpated in throat, no dysphagia/odynophagia, no hoarseness Cardiovascular: no chest pain, no palpitations.   Respiratory: no cough, no SOB Gastrointestinal: no Nausea/Vomiting/Diarhhea Musculoskeletal: + Wheelchair-bound ,  stiff muscles on extremities.  Skin: no rashes Neurological: no tremors, no numbness, no tingling, no dizziness Psychiatric: + depression, no anxiety  Objective:    BP (!) 152/82   Pulse 81   Ht 5\' 10"  (1.778 m)   BMI  26.40 kg/m   Wt Readings from Last 3 Encounters:  09/12/17 184 lb (83.5 kg)  07/28/17 181 lb 6.4 oz (82.3 kg)  07/07/17 180 lb 12.8 oz (82 kg)    Physical Exam  Constitutional:  not in acute distress,  + wheelchair-bound, with stiff and spastic right upper and lower extremities Eyes: PERRLA, EOMI, no exophthalmos, + drooling ENT: moist mucous membranes, no thyromegaly, no cervical lymphadenopathy  Musculoskeletal: + + Wheelchair-bound, with stiff and spastic right upper and lower extremities Skin: moist, warm, no rashes Neurological: No tremors on outstretched hands,  Deep tendon reflexes normal in all four extremities.  CMP ( most recent) CMP     Component Value Date/Time   NA 139 09/19/2017 0630   NA 142 09/12/2015   K 3.9 09/19/2017 0630   CL 103 09/19/2017 0630   CO2 26 09/19/2017 0630   GLUCOSE 78 09/19/2017 0630   BUN 19 09/19/2017 0630   BUN 18 09/12/2015   CREATININE 1.11 09/19/2017 0630   CALCIUM 8.7 (L) 09/19/2017 0630   PROT 7.3 07/19/2017 0709   ALBUMIN 3.3 (L) 07/19/2017 0709   AST 25 07/19/2017 0709   ALT 29 07/19/2017 0709   ALKPHOS 79 07/19/2017 0709   BILITOT 0.7 07/19/2017 0709   GFRNONAA >60 09/19/2017 0630   GFRAA >60 09/19/2017 0630      Diabetic Labs (most recent): Lab Results  Component Value Date   HGBA1C  03/27/2009    6.0 (NOTE) The ADA recommends the following therapeutic goal for glycemic control related to Hgb A1c measurement: Goal of therapy: <6.5 Hgb A1c  Reference: American Diabetes Association: Clinical Practice Recommendations 2010, Diabetes Care, 2010, 33: (Suppl  1).   HGBA1C 6 06/22/2008     Lipid Panel ( most recent) Lipid Panel     Component Value Date/Time   CHOL 134 07/19/2017 0709   TRIG 52 07/19/2017 0709   HDL 39 (L) 07/19/2017 0709   CHOLHDL 3.4 07/19/2017 0709   VLDL 10 07/19/2017 0709   LDLCALC 85 07/19/2017 0709   Results for [name] [name], [name] [name] (MRN [id number]) as of 09/20/2017 17:06  Ref. Range 09/13/2017 01:51 09/13/2017 10:35 09/13/2017 10:36  TSH Latest Ref Range: 0.350 - 4.500 uIU/mL 0.969    Triiodothyronine,Free,Serum Latest Ref Range: 2.0 - 4.4 pg/mL   2.0  Triiodothyronine (T3) Latest Ref Range: 71 - 180 ng/dL  82   W0,JWJX(BJYNWG) Latest Ref Range: 0.61 - 1.12 ng/dL 9.56    Thyroxine (T4) Latest Ref Range: 4.5 - 12.0 ug/dL  5.7     Thyroid uptake and scan showed nonfocal, elevated uptake of 42%   Assessment & Plan:   1.  Hyperthyroidism 2. Tachycardia  - His thyroid uptake and scan is consistent with hyperthyroidism, nonfocal elevated uptake of 42%. Due to some logistical problems from nursing home, he was not given radioactive iodine therapy, however is responding to this treatment very well. -I advised him and his brother to continue methimazole 5 mg p.o. every morning with breakfast with plan to repeat thyroid function test in 3 months.   . No need to make adjustment of the dose of methimazole for now. - Regarding tachycardia: He is already on a beta blocker, labetalol 200 mg daily, pulse rate is controlled at 72.  - I advised patient to maintain close follow up with Mahlon Gammon, MD for primary care needs. Follow up plan: Return in about 3 months (around  12/20/2017) for follow up with pre-visit labs.  Marquis Lunch, MD [facility] End[facility]  [facility] Phone: [phone]  Fax: [phone]   09/20/2017, 5:05 PM This note was partially dictated with voice recognition software. Similar sounding words can be transcribed inadequately or may not  be corrected upon review.

## 2017-10-03 ENCOUNTER — Encounter (HOSPITAL_COMMUNITY)
Admission: RE | Admit: 2017-10-03 | Discharge: 2017-10-03 | Disposition: A | Discharge status: Home / Self Care | Payer: Medicare Other | Source: Skilled Nursing Facility | Attending: *Deleted | Admitting: *Deleted

## 2017-10-03 DIAGNOSIS — K562 Volvulus: Secondary | ICD-10-CM | POA: Diagnosis not present

## 2017-10-03 DIAGNOSIS — I69328 Other speech and language deficits following cerebral infarction: Secondary | ICD-10-CM | POA: Diagnosis not present

## 2017-10-03 DIAGNOSIS — R1312 Dysphagia, oropharyngeal phase: Secondary | ICD-10-CM | POA: Diagnosis not present

## 2017-10-03 DIAGNOSIS — M6281 Muscle weakness (generalized): Secondary | ICD-10-CM | POA: Diagnosis not present

## 2017-10-03 DIAGNOSIS — I10 Essential (primary) hypertension: Secondary | ICD-10-CM | POA: Diagnosis not present

## 2017-10-03 LAB — PROTIME-INR
INR: 2.09
PROTHROMBIN TIME: 23.3 s — AB (ref 11.4–15.2)

## 2017-10-11 ENCOUNTER — Non-Acute Institutional Stay (SKILLED_NURSING_FACILITY): Payer: Medicare Other | Admitting: Internal Medicine

## 2017-10-11 ENCOUNTER — Encounter: Payer: Self-pay | Admitting: Internal Medicine

## 2017-10-11 DIAGNOSIS — K562 Volvulus: Secondary | ICD-10-CM | POA: Diagnosis not present

## 2017-10-11 DIAGNOSIS — I639 Cerebral infarction, unspecified: Secondary | ICD-10-CM

## 2017-10-11 DIAGNOSIS — S0502XS Injury of conjunctiva and corneal abrasion without foreign body, left eye, sequela: Secondary | ICD-10-CM | POA: Diagnosis not present

## 2017-10-11 DIAGNOSIS — I10 Essential (primary) hypertension: Secondary | ICD-10-CM | POA: Diagnosis not present

## 2017-10-11 DIAGNOSIS — J410 Simple chronic bronchitis: Secondary | ICD-10-CM

## 2017-10-11 DIAGNOSIS — E0590 Thyrotoxicosis, unspecified without thyrotoxic crisis or storm: Secondary | ICD-10-CM

## 2017-10-11 NOTE — Progress Notes (Signed)
Location:   [facility] Room Number: 112/W Place of Service:  SNF [phone]) Provider:  Sabino DickArlo, Valynn Schamberger  Gupta, Anjali L, MD  Patient Care Team: Mahlon GammonGupta, Anjali L, [name]  Extended Emergency Contact Information Primary Emergency Contact: Venetia MaxonWalker,[name] Jr Address: [address]          [place], [place] [address] [place] Home Phone: [phone] Mobile Phone: [phone] Relation: Brother  Code Status:  Full Code Goals of care: Advanced Directive information Advanced Directives 10/11/2017  Does Patient Have a Medical Advance Directive? Yes  Type of Advance Directive (No Data)  Does patient want to make changes to medical advance directive? No - Patient declined  Copy of Healthcare Power of Attorney in Chart? No - copy requested  Would patient like information on creating a medical advance directive? No - Patient declined     Chief Complaint  Patient presents with  . Medical Management of Chronic Issues    Routine Visit  For medical management of chronic medical conditions including COPD-history of CVA with right-sided hemiparesis-previous history of DVT and pulmonary embolism- as well as hypertension and recurrent sigmoid volvulus-in addition to subclinical hyperthyroidism .   HPI:  Pt is a 72 y.o. male seen today for medical management of chronic diseases --- as noted above.  He continues to be quite stable he has gained weight and has a very good appetite he did lose weight when he had a sigmoid volvulus which required hospitalization ---- this has been stable now for some time appears to be to be helping    in regards to COPD he is on Incruse Ellipta as well as duo nebs as needed he is also on Symbicort and Spiriva-this has been stable for some time.  Marland Kitchen.  He does have a history of CVA with right-sided hemiparesis but continues to ambulate in a wheelchair he is very determined to keep ambulating using his left sided  extremities which is commendable.  He is on Coumadin INR has been therapeutic he is also on Coumadin with a history of DVT and pulmonary embolism in the past.  Regards to hypertension he is on labetalol lisinopril was discontinued because of hypotension concerns manual blood pressure today was 120/70 see previous readings 138/94-128/76 this appears to be relatively stable.--He does have a listed reading of systolic in the 140s today but this is not the normal occasionally will have spikes but is not consistent   He also has a history of subclinical hyperthyroidism which is followed closely by Dr.Nida   of endocrinology-he is on Tapazole.  He continues on erythromycin secondary to a left corneal abrasion-he does have ophthalmology follow-up next month.  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  Currently has no complaints again vital signs appear to be stable.     Past Medical History:  Diagnosis Date  . Dysphasia   . Fatty liver   . Hemiplegia (HCC)   . Hypertension   . Pneumonia   . Pulmonary embolism (HCC)   . 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(HCC)   . Stroke [facility](HCC)    Past Surgical History:  Procedure Laterality Date  . FLEXIBLE SIGMOIDOSCOPY N/A 10/21/2016   Procedure: FLEXIBLE SIGMOIDOSCOPY;  Surgeon: Malissa Hippoehman, Najeeb U, MD;  [facility];  Service: Endoscopy;  Laterality: N/A;  . FLEXIBLE SIGMOIDOSCOPY N/A 10/25/2016   Procedure: FLEXIBLE SIGMOIDOSCOPY;  Surgeon: Malissa Hippoehman, Najeeb U, MD;  [facility];  Service: Endoscopy;  Laterality:  N/A;  . unable      Allergies  Allergen Reactions  . Penicillins     Has patient had a PCN reaction causing immediate rash, facial/tongue/throat swelling, SOB or lightheadedness with hypotension: unknown Has patient had a PCN reaction causing severe rash involving mucus membranes or skin necrosis: unknown Has patient had a PCN reaction that required hospitalization: unknown Has patient had a PCN reaction occurring within the last 10  years: unknown If all of the above answers are "NO", then may proceed with Cephalosporin use. 5/3 Tolerated Cefepime x 1 dose in ED.    Is Outpatient Encounter Medications as of 10/11/2017  Medication Sig  . acetaminophen (TYLENOL) 325 MG tablet Take 650 mg by mouth every 6 (six) hours as needed for moderate pain.   Marland Kitchen atorvastatin (LIPITOR) 10 MG tablet Take 10 mg by mouth daily.  . betamethasone dipropionate (DIPROLENE) 0.05 % cream Apply to legs and hands. Do not apply to face groin, axilla. Try Cerave first twice a day.  . budesonide-formoterol (SYMBICORT) 80-4.5 MCG/ACT inhaler Inhale 2 puffs into the lungs 2 (two) times daily.  . carboxymethylcellulose (REFRESH PLUS) 0.5 % SOLN Place 1 drop into both eyes 3 (three) times daily.   . Emollient (CERAVE) CREA Apply 1 application topically daily as needed for itching once a day  . erythromycin ophthalmic ointment appy 1 ribbon ophthalmic (eye) 5 times a day  . Eyelid Cleansers (SYSTANE LID WIPES EX) Apply 1 application topically 2 (two) times daily. Start occusoft lid scrubs: clean eyelids of oil and debris BID OU ( each eye ) before administering eye drops.   Marland Kitchen ipratropium (ATROVENT) 0.02 % nebulizer solution Take 0.5 mg by nebulization every 6 (six) hours.  Marland Kitchen labetalol (NORMODYNE) 200 MG tablet Take 200 mg by mouth daily. For HTN  . loratadine (CLARITIN) 10 MG tablet Take 10 mg by mouth daily.  . methimazole (TAPAZOLE) 5 MG tablet Take 5 mg by mouth daily.  . polyethylene glycol (MIRALAX / GLYCOLAX) packet Take 17 g by mouth at bedtime.  . sennosides-docusate sodium (SENOKOT-S) 8.6-50 MG tablet Take 1 tablet by mouth at bedtime. For constipation  . umeclidinium bromide (INCRUSE ELLIPTA) 62.5 MCG/INH AEPB Inhale 1 puff into the lungs daily.  Marland Kitchen warfarin (COUMADIN) 1 MG tablet Take 1/2 of ( 1 mg ) tablet by mouth along with 3 mg to = 3.5 mg once a day on Sun., Mon., Wed., Fri.  . warfarin (COUMADIN) 3 MG tablet Take 3 mg tablet by mouth along  with 1/2 tablet of (1 mg) to = 3.5 mg once a day on Sun., Mon., Wed., Fri.  . warfarin (COUMADIN) 4 MG tablet Take 4 mg tablet by mouth once a day on Tues., Thus., Sat.  . [DISCONTINUED] potassium chloride (K-DUR,KLOR-CON) 10 MEQ tablet Take 40 mEq by mouth daily.   . [DISCONTINUED] tiotropium (SPIRIVA HANDIHALER) 18 MCG inhalation capsule Place 18 mcg into inhaler and inhale daily.   No facility-administered encounter medications on file as of 10/11/2017.      Review of Systems   In general in general is not complaining of any fever or chills he has gained some weight I suspect secondary to a good appetite.  Skin is not complaining of rashes or itching.  Head ears eyes nose mouth and throat he does have left eye blindness has a corneal abrasion he is followed closely by ophthalmology is on erythromycin.  He does not complain of sore throat or difficulty swallowing.  Respiratory is not complaining  of shortness of breath or cough does have a history of COPD as noted above.  Cardiac is not complaining of chest pain has chronic lower extremity edema at baseline.  GI is not complaining of abdominal discomfort nausea vomiting diarrhea constipation again does have a history of sigmoid volvulus.  GU does not complain of dysuria.  Musculoskeletal continues with right-sided hemiparesis but does not complain of joint pain.  Neurologic as noted above he is not complaining of headache or dizziness or syncopal type feelings.  Psych does not complain of being anxious or depressed continues to be motivated ambulating in his wheelchair using his left-sided extremities  Immunization History  Administered Date(s) Administered  . Influenza-Unspecified 03/28/2014, 03/23/2016, 03/25/2017  . Pneumococcal Conjugate-13 04/21/2015  . Pneumococcal-Unspecified 11/01/2009, 03/30/2016  . Tdap 03/17/2017   Pertinent  Health Maintenance Due  Topic Date Due  . COLONOSCOPY  10/30/2025 (Originally 11/08/1995)    . INFLUENZA VACCINE  01/19/2018  . PNA vac Low Risk Adult  Completed   Fall Risk  04/05/2017 03/17/2017  Falls in the past year? No No  Risk for fall due to : Impaired balance/gait;Impaired mobility;Medication side effect;Impaired vision;History of fall(s) -   Functional Status Survey:    Vitals:   10/11/17 1226  BP: (!) 147/78  Pulse: 83  Resp: 20  Temp: 98.9 F (37.2 C)  TempSrc: Oral  SpO2: 100%  Weight: 190 lb 6.4 oz (86.4 kg)  Height: 5' 8.5" (1.74 m)  Of note manual blood pressure was 120/70    Body mass index is 28.53 kg/m. Physical Exam   --- In general this is a pleasant elderly male in no distress sitting comfortably in his wheelchair.  His skin is warm and dry.  Eyes he does have an opacity of his left eye right eye visual acuity appears to be grossly intact he has prescription lenses  Oropharynx is clear mucous membranes moist.  Chest has shallow air entry could not really appreciate wheezing or rhonchi today-somewhat improved from previous exams there is no labored breathing.  Heart is regular rate and rhythm without murmur gallop or rub he has chronic lower extremity edema.  Abdomen is obese soft nontender with positive bowel sounds.  Musculoskeletal continues to have right-sided hemiparesis and a mild contracture of his right hand- left upper and lower extremity strength appears to be intact again he ambulates in wheelchair using these extensively.  Neurologic as noted above with right-sided hemiparesis he does have slightly slurred speech this is baseline.  Psych he is alert and oriented pleasant and appropriate    Labs reviewed: Recent Labs    10/29/16 0412  11/01/16 0700  11/04/16 0438  04/06/17 0701 07/19/17 0709 09/19/17 0630  NA 147*   < > 146*   < > 143   < > 137 139 139  K 2.6*   < > 2.8*   < > 2.9*   < > 4.1 4.2 3.9  CL 115*   < > 110   < > 107   < > 100* 102 103  CO2 28   < > 29   < > 31   < > 28 24 26   GLUCOSE 96   < > 82   < >  90   < > 93 80 78  BUN 9   < > 14   < > 12   < > 21* 20 19  CREATININE 0.89   < > 0.85   < > 0.86   < >  1.03 1.02 1.11  CALCIUM 7.9*   < > 8.1*   < > 7.9*   < > 8.8* 8.9 8.7*  MG 1.6*  --  1.8  --  1.6*  --   --   --   --    < > = values in this interval not displayed.   Recent Labs    11/03/16 0455 12/21/16 0500 07/19/17 0709  AST 26 23 25   ALT 42 29 29  ALKPHOS 51 58 79  BILITOT 0.4 0.4 0.7  PROT 6.4* 7.0 7.3  ALBUMIN 2.9* 3.2* 3.3*   Recent Labs    12/21/16 0500 01/06/17 0715 03/09/17 0711 07/19/17 0709  WBC 7.8  --  7.7 7.5  NEUTROABS 4.2  --  4.5 4.1  HGB 13.9  --  15.1 15.5  HCT 44.1 44.5 46.2 48.6  MCV 93.4  --  91.5 93.8  PLT 280  --  269 297   Lab Results  Component Value Date   TSH 0.969 09/13/2017   Lab Results  Component Value Date   HGBA1C  03/27/2009    6.0 (NOTE) The ADA recommends the following therapeutic goal for glycemic control related to Hgb A1c measurement: Goal of therapy: <6.5 Hgb A1c  Reference: American Diabetes Association: Clinical Practice Recommendations 2010, Diabetes Care, 2010, 33: (Suppl  1).   Lab Results  Component Value Date   CHOL 134 07/19/2017   HDL 39 (L) 07/19/2017   LDLCALC 85 07/19/2017   TRIG 52 07/19/2017   CHOLHDL 3.4 07/19/2017    Significant Diagnostic Results in last 30 days:  No results found.  Assessment/Plan  #1 history of CVA with right-sided hemiparesis-he appears stable-at baseline he continues on Coumadin- again he is motivated and ambulates in a wheelchair using his left-sided extremities.  He is also on Coumadin with a history of DVT and pulmonary embolism past.--- INR is therapeutic today at 2.09 update INR is pending  2.  COPD this appears stable per exam today on current meds including Incruse Ellipta-PRN duo nebs-also Symbicort and Spiriva.  3.  History of hypertension again he has some variable systolics but generally appears to be stable here as noted above he is on labetalol lisinopril  discontinued because of hypotensive readings.  4.-History of recurrent sigmoid volvulus this has stabilized he is on MiraLAX this was quite an issue requiring hospitalization at one point.--he also is on senna at night-   5.-  History of subclinical hypothyroidism he is followed by endocrinology-he is on Tapazole.  6.-  History of left corneal abrasion again he is on erythromycin he will be reevaluated by ophthalmology next month.  7.  History of hyperlipidemia he has been started on Lipitor with goal LDL of less than 70 it was 85 in January  .  #8 history of hypokalemia in the past    At one point this was supplemented he is not on a diuretic there is recommendation to monitor this with his history of sigmoid volvulus will update a BMP   [id number]       :

## 2017-10-12 DIAGNOSIS — R293 Abnormal posture: Secondary | ICD-10-CM | POA: Diagnosis not present

## 2017-10-12 DIAGNOSIS — M6281 Muscle weakness (generalized): Secondary | ICD-10-CM | POA: Diagnosis not present

## 2017-10-12 DIAGNOSIS — I69328 Other speech and language deficits following cerebral infarction: Secondary | ICD-10-CM | POA: Diagnosis not present

## 2017-10-13 DIAGNOSIS — B351 Tinea unguium: Secondary | ICD-10-CM | POA: Diagnosis not present

## 2017-10-13 DIAGNOSIS — I803 Phlebitis and thrombophlebitis of lower extremities, unspecified: Secondary | ICD-10-CM | POA: Diagnosis not present

## 2017-10-13 DIAGNOSIS — I739 Peripheral vascular disease, unspecified: Secondary | ICD-10-CM | POA: Diagnosis not present

## 2017-10-14 DIAGNOSIS — I69328 Other speech and language deficits following cerebral infarction: Secondary | ICD-10-CM | POA: Diagnosis not present

## 2017-10-14 DIAGNOSIS — M6281 Muscle weakness (generalized): Secondary | ICD-10-CM | POA: Diagnosis not present

## 2017-10-14 DIAGNOSIS — R293 Abnormal posture: Secondary | ICD-10-CM | POA: Diagnosis not present

## 2017-10-17 DIAGNOSIS — R293 Abnormal posture: Secondary | ICD-10-CM | POA: Diagnosis not present

## 2017-10-17 DIAGNOSIS — M6281 Muscle weakness (generalized): Secondary | ICD-10-CM | POA: Diagnosis not present

## 2017-10-17 DIAGNOSIS — I69328 Other speech and language deficits following cerebral infarction: Secondary | ICD-10-CM | POA: Diagnosis not present

## 2017-10-18 DIAGNOSIS — M6281 Muscle weakness (generalized): Secondary | ICD-10-CM | POA: Diagnosis not present

## 2017-10-18 DIAGNOSIS — I69328 Other speech and language deficits following cerebral infarction: Secondary | ICD-10-CM | POA: Diagnosis not present

## 2017-10-18 DIAGNOSIS — R293 Abnormal posture: Secondary | ICD-10-CM | POA: Diagnosis not present

## 2017-10-21 DIAGNOSIS — I69328 Other speech and language deficits following cerebral infarction: Secondary | ICD-10-CM | POA: Diagnosis not present

## 2017-10-21 DIAGNOSIS — R293 Abnormal posture: Secondary | ICD-10-CM | POA: Diagnosis not present

## 2017-10-21 DIAGNOSIS — M6281 Muscle weakness (generalized): Secondary | ICD-10-CM | POA: Diagnosis not present

## 2017-10-24 ENCOUNTER — Encounter (HOSPITAL_COMMUNITY)
Admission: RE | Admit: 2017-10-24 | Discharge: 2017-10-24 | Disposition: A | Discharge status: Home / Self Care | Payer: Medicare Other | Source: Skilled Nursing Facility | Attending: Internal Medicine | Admitting: Internal Medicine

## 2017-10-24 DIAGNOSIS — G8191 Hemiplegia, unspecified affecting right dominant side: Secondary | ICD-10-CM | POA: Diagnosis not present

## 2017-10-24 DIAGNOSIS — I69998 Other sequelae following unspecified cerebrovascular disease: Secondary | ICD-10-CM | POA: Insufficient documentation

## 2017-10-24 DIAGNOSIS — R293 Abnormal posture: Secondary | ICD-10-CM | POA: Insufficient documentation

## 2017-10-24 DIAGNOSIS — I10 Essential (primary) hypertension: Secondary | ICD-10-CM | POA: Insufficient documentation

## 2017-10-24 DIAGNOSIS — Z7901 Long term (current) use of anticoagulants: Secondary | ICD-10-CM | POA: Insufficient documentation

## 2017-10-24 DIAGNOSIS — I69328 Other speech and language deficits following cerebral infarction: Secondary | ICD-10-CM | POA: Diagnosis not present

## 2017-10-24 DIAGNOSIS — M6281 Muscle weakness (generalized): Secondary | ICD-10-CM | POA: Diagnosis not present

## 2017-10-24 LAB — PROTIME-INR
INR: 1.96
PROTHROMBIN TIME: 22.1 s — AB (ref 11.4–15.2)

## 2017-10-27 DIAGNOSIS — M6281 Muscle weakness (generalized): Secondary | ICD-10-CM | POA: Diagnosis not present

## 2017-10-27 DIAGNOSIS — I69328 Other speech and language deficits following cerebral infarction: Secondary | ICD-10-CM | POA: Diagnosis not present

## 2017-10-27 DIAGNOSIS — R293 Abnormal posture: Secondary | ICD-10-CM | POA: Diagnosis not present

## 2017-10-28 DIAGNOSIS — I69328 Other speech and language deficits following cerebral infarction: Secondary | ICD-10-CM | POA: Diagnosis not present

## 2017-10-28 DIAGNOSIS — M6281 Muscle weakness (generalized): Secondary | ICD-10-CM | POA: Diagnosis not present

## 2017-10-28 DIAGNOSIS — R293 Abnormal posture: Secondary | ICD-10-CM | POA: Diagnosis not present

## 2017-10-31 ENCOUNTER — Encounter (HOSPITAL_COMMUNITY)
Admission: RE | Admit: 2017-10-31 | Discharge: 2017-10-31 | Disposition: A | Discharge status: Home / Self Care | Payer: Medicare Other | Source: Skilled Nursing Facility | Attending: Internal Medicine | Admitting: Internal Medicine

## 2017-10-31 DIAGNOSIS — I10 Essential (primary) hypertension: Secondary | ICD-10-CM | POA: Diagnosis not present

## 2017-10-31 DIAGNOSIS — R293 Abnormal posture: Secondary | ICD-10-CM | POA: Diagnosis not present

## 2017-10-31 DIAGNOSIS — Z7901 Long term (current) use of anticoagulants: Secondary | ICD-10-CM | POA: Diagnosis not present

## 2017-10-31 DIAGNOSIS — I69998 Other sequelae following unspecified cerebrovascular disease: Secondary | ICD-10-CM | POA: Diagnosis not present

## 2017-10-31 DIAGNOSIS — G8191 Hemiplegia, unspecified affecting right dominant side: Secondary | ICD-10-CM | POA: Diagnosis not present

## 2017-10-31 LAB — PROTIME-INR
INR: 2.24
PROTHROMBIN TIME: 24.6 s — AB (ref 11.4–15.2)

## 2017-11-02 DIAGNOSIS — I69328 Other speech and language deficits following cerebral infarction: Secondary | ICD-10-CM | POA: Diagnosis not present

## 2017-11-02 DIAGNOSIS — R293 Abnormal posture: Secondary | ICD-10-CM | POA: Diagnosis not present

## 2017-11-02 DIAGNOSIS — M6281 Muscle weakness (generalized): Secondary | ICD-10-CM | POA: Diagnosis not present

## 2017-11-03 ENCOUNTER — Encounter: Payer: Self-pay | Admitting: Internal Medicine

## 2017-11-03 ENCOUNTER — Non-Acute Institutional Stay (SKILLED_NURSING_FACILITY): Payer: Medicare Other | Admitting: Internal Medicine

## 2017-11-03 DIAGNOSIS — J410 Simple chronic bronchitis: Secondary | ICD-10-CM

## 2017-11-03 DIAGNOSIS — I639 Cerebral infarction, unspecified: Secondary | ICD-10-CM

## 2017-11-03 DIAGNOSIS — I69328 Other speech and language deficits following cerebral infarction: Secondary | ICD-10-CM | POA: Diagnosis not present

## 2017-11-03 DIAGNOSIS — M6281 Muscle weakness (generalized): Secondary | ICD-10-CM | POA: Diagnosis not present

## 2017-11-03 DIAGNOSIS — E0590 Thyrotoxicosis, unspecified without thyrotoxic crisis or storm: Secondary | ICD-10-CM | POA: Diagnosis not present

## 2017-11-03 DIAGNOSIS — K562 Volvulus: Secondary | ICD-10-CM

## 2017-11-03 DIAGNOSIS — S0502XS Injury of conjunctiva and corneal abrasion without foreign body, left eye, sequela: Secondary | ICD-10-CM

## 2017-11-03 DIAGNOSIS — I10 Essential (primary) hypertension: Secondary | ICD-10-CM | POA: Diagnosis not present

## 2017-11-03 DIAGNOSIS — R293 Abnormal posture: Secondary | ICD-10-CM | POA: Diagnosis not present

## 2017-11-03 DIAGNOSIS — I2782 Chronic pulmonary embolism: Secondary | ICD-10-CM | POA: Diagnosis not present

## 2017-11-03 NOTE — Progress Notes (Signed)
Location:   [facility] Room Number: 112/W Place of Service:  SNF [phone]) Provider:  Pollyann Savoy, MD  [name], [name]  Extended Emergency Contact Information Primary Emergency Contact: [name] Address: [address]          [place], [place] [address] [place] Home Phone: [phone] Mobile Phone: [phone] Relation: Brother  Code Status:  Full Code Goals of care: Advanced Directive information Advanced Directives 11/03/2017  Does Patient Have a Medical Advance Directive? Yes  Type of Advance Directive (No Data)  Does patient want to make changes to medical advance directive? No - Patient declined  Copy of Healthcare Power of Attorney in Chart? No - copy requested  Would patient like information on creating a medical advance directive? No - Patient declined     Chief Complaint  Patient presents with  . Medical Management of Chronic Issues    Patient being seen for Routine Visit    HPI:  Pt is a 72 y.o. male seen today for medical management of chronic diseases.   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 And Left Corneal Abrasion Patient is doing well in facility. He has gained almost 8 lbs and is up to 193 lbs. He is eating well. And his Bowels are moving well. No Cough or Chest pain. He does follow with Ophthalmology for his left eye Abrasion and is on Chronic Erythromycin ointment. No New Nursing issues    Past Medical History:  Diagnosis Date  . Dysphasia   . Fatty liver   . Hemiplegia (HCC)   . Hypertension   . Pneumonia   . Pulmonary embolism (HCC)   . 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(HCC)   . Stroke [facility])    Past Surgical History:  Procedure Laterality Date  . FLEXIBLE SIGMOIDOSCOPY  N/A 10/21/2016   Procedure: FLEXIBLE SIGMOIDOSCOPY;  Surgeon: Malissa Hippo, MD;  [facility];  Service: Endoscopy;  Laterality: N/A;  . FLEXIBLE SIGMOIDOSCOPY N/A 10/25/2016   Procedure: FLEXIBLE SIGMOIDOSCOPY;  Surgeon: Malissa Hippo, MD;  Location: [facility];  Service: Endoscopy;  Laterality: N/A;  . unable      Allergies  Allergen Reactions  . Penicillins     Has patient had a PCN reaction causing immediate rash, facial/tongue/throat swelling, SOB or lightheadedness with hypotension: unknown Has patient had a PCN reaction causing severe rash involving mucus membranes or skin necrosis: unknown Has patient had a PCN reaction that required hospitalization: unknown Has patient had a PCN reaction occurring within the last 10 years: unknown If all of the above answers are "NO", then may proceed with Cephalosporin use. 5/3 Tolerated Cefepime x 1 dose in ED.     Outpatient Encounter Medications as of 11/03/2017  Medication Sig  . acetaminophen (TYLENOL) 325 MG tablet Take 650 mg by mouth every 6 (six) hours as needed for moderate pain.   Marland Kitchen atorvastatin (LIPITOR) 10 MG tablet Take 10 mg by mouth daily.  . betamethasone dipropionate (DIPROLENE) 0.05 % cream Apply to legs and hands. Do not apply to face groin, axilla. Try Cerave first twice a day.  . Bisacodyl (DULCOLAX RE) Place rectally. 1 per rectum ; rectal hemorrhoids at bedtime  . budesonide-formoterol (SYMBICORT) 80-4.5 MCG/ACT inhaler Inhale 2 puffs into the lungs  2 (two) times daily.  . carboxymethylcellulose (REFRESH PLUS) 0.5 % SOLN Place 1 drop into both eyes 3 (three) times daily.   . Emollient (CERAVE) CREA Apply 1 application topically daily as needed for itching once a day  . erythromycin ophthalmic ointment appy 1 ribbon ophthalmic (eye) 5 times a day  . Eyelid Cleansers (SYSTANE LID WIPES EX) Apply 1 application topically 2 (two) times daily. Start occusoft lid scrubs: clean eyelids of oil and debris BID OU (  each eye ) before administering eye drops.   Marland Kitchen ipratropium (ATROVENT) 0.02 % nebulizer solution Take 0.5 mg by nebulization every 6 (six) hours.  Marland Kitchen labetalol (NORMODYNE) 200 MG tablet Take 200 mg by mouth daily. For HTN  . loratadine (CLARITIN) 10 MG tablet Take 10 mg by mouth daily.  . methimazole (TAPAZOLE) 5 MG tablet Take 5 mg by mouth daily.  . polyethylene glycol (MIRALAX / GLYCOLAX) packet Take 17 g by mouth at bedtime.  . sennosides-docusate sodium (SENOKOT-S) 8.6-50 MG tablet Take 1 tablet by mouth at bedtime. For constipation  . umeclidinium bromide (INCRUSE ELLIPTA) 62.5 MCG/INH AEPB Inhale 1 puff into the lungs daily.  Marland Kitchen warfarin (COUMADIN) 1 MG tablet Take 1/2 of ( 1 mg ) tablet by mouth along with 3 mg to = 3.5 mg once a day on  Mon., Wed., Fri.  . warfarin (COUMADIN) 3 MG tablet Take 3 mg tablet by mouth along with 1/2 tablet of (1 mg) to = 3.5 mg once a day on Mon., Wed., Fri.  . warfarin (COUMADIN) 4 MG tablet Take 4 mg tablet by mouth once a day on Sun., Tues., Thus., Sat.   No facility-administered encounter medications on file as of 11/03/2017.      Review of Systems  Review of Systems  Constitutional: Negative for activity change, appetite change, chills, diaphoresis, fatigue and fever.  HENT: Negative for mouth sores, postnasal drip, rhinorrhea, sinus pain and sore throat.   Respiratory: Negative for apnea, cough, chest tightness, shortness of breath and wheezing.   Cardiovascular: Negative for chest pain, palpitations and leg swelling.  Gastrointestinal: Negative for abdominal distention, abdominal pain, constipation, diarrhea, nausea and vomiting.  Genitourinary: Negative for dysuria and frequency.  Musculoskeletal: Negative for arthralgias, joint swelling and myalgias.  Skin: Negative for rash.  Neurological: Negative for dizziness, syncope, weakness, light-headedness and numbness.  Psychiatric/Behavioral: Negative for behavioral problems, confusion and sleep  disturbance.     Immunization History  Administered Date(s) Administered  . Influenza-Unspecified 03/28/2014, 03/23/2016, 03/25/2017  . Pneumococcal Conjugate-13 04/21/2015  . Pneumococcal-Unspecified 11/01/2009, 03/30/2016  . Tdap 03/17/2017   Pertinent  Health Maintenance Due  Topic Date Due  . COLONOSCOPY  10/30/2025 (Originally 11/08/1995)  . INFLUENZA VACCINE  01/19/2018  . PNA vac Low Risk Adult  Completed   Fall Risk  04/05/2017 03/17/2017  Falls in the past year? No No  Risk for fall due to : Impaired balance/gait;Impaired mobility;Medication side effect;Impaired vision;History of fall(s) -   Functional Status Survey:    Vitals:   11/03/17 1426  BP: (!) 163/90  Pulse: 61  Resp: 20  Temp: (!) 97.4 F (36.3 C)  TempSrc: Oral  SpO2: 97%  Weight: 193 lb (87.5 kg)  Height: 5' 8.5" (1.74 m)   Body mass index is 28.92 kg/m. Physical Exam  Constitutional: He is oriented to person, place, and time. He appears well-developed and well-nourished.  HENT:  Head: Normocephalic.  Mouth/Throat: Oropharynx is clear and moist.  Eyes: Right eye exhibits no discharge. Left  eye exhibits discharge.  Neck: Neck supple.  Cardiovascular: Normal rate and regular rhythm.  Pulmonary/Chest: Effort normal and breath sounds normal. No stridor. No respiratory distress. He has no wheezes.  Abdominal: Soft. Bowel sounds are normal. He exhibits no distension. There is no tenderness. There is no guarding.  Musculoskeletal:  Chronic Venous stasis Changes bilateral  Lymphadenopathy:    He has no cervical adenopathy.  Neurological: He is alert and oriented to person, place, and time.  Skin: Skin is warm and dry.  Psychiatric: He has a normal mood and affect. His behavior is normal.    Labs reviewed: Recent Labs    11/04/16 0438  04/06/17 0701 07/19/17 0709 09/19/17 0630  NA 143   < > 137 139 139  K 2.9*   < > 4.1 4.2 3.9  CL 107   < > 100* 102 103  CO2 31   < > GLUCOSE 90    < > 93 80 78  BUN 12   < > 21* 20 19  CREATININE 0.86   < > 1.03 1.02 1.11  CALCIUM 7.9*   < > 8.8* 8.9 8.7*  MG 1.6*  --   --   --   --    < > = values in this interval not displayed.   Recent Labs    12/21/16 0500 07/19/17 0709  AST 23 25  ALT 29 29  ALKPHOS 58 79  BILITOT 0.4 0.7  PROT 7.0 7.3  ALBUMIN 3.2* 3.3*   Recent Labs    12/21/16 0500 01/06/17 0715 03/09/17 0711 07/19/17 0709  WBC 7.8  --  7.7 7.5  NEUTROABS 4.2  --  4.5 4.1  HGB 13.9  --  15.1 15.5  HCT 44.1 44.5 46.2 48.6  MCV 93.4  --  91.5 93.8  PLT 280  --  269 297   Lab Results  Component Value Date   TSH 0.969 09/13/2017   Lab Results  Component Value Date   HGBA1C  03/27/2009    6.0 (NOTE) The ADA recommends the following therapeutic goal for glycemic control related to Hgb A1c measurement: Goal of therapy: <6.5 Hgb A1c  Reference: American Diabetes Association: Clinical Practice Recommendations 2010, Diabetes Care, 2010, 33: (Suppl  1).   Lab Results  Component Value Date   CHOL 134 07/19/2017   HDL 39 (L) 07/19/2017   LDLCALC 85 07/19/2017   TRIG 52 07/19/2017   CHOLHDL 3.4 07/19/2017    Significant Diagnostic Results in last 30 days:  No results found.  Assessment/Plan COPD Patient Stable on Incruse Ellipta and Symbicort Essential hypertension On Labetolol. Recently his BP is running high With his h/o stroke  will restart him on Lisinopril Corneal Abrasion On Erythromycin Ointment  follows with Opthalmology  Recurrent PE and chronic DVT On Coumadin Dose adjusted for his INR Sigmoid volvulusresolved. Continue on Miralax Patient has done well and is eating well and gaining weight Subclinical hyperthyroidism Follow Up With Endocrinology On Tapazole  S/P CVA Patient stable.  He is now on Dyphagic Diet and doing well. Hyperlipidemia On Low dose of statin    Family/ staff Communication:   Labs/tests ordered:    Total time spent in this patient care  encounter was 25_ minutes; greater than 50% of the visit spent counseling patient, reviewing records , Labs and coordinating care for problems addressed at this encounter.

## 2017-11-04 DIAGNOSIS — I69328 Other speech and language deficits following cerebral infarction: Secondary | ICD-10-CM | POA: Diagnosis not present

## 2017-11-04 DIAGNOSIS — M6281 Muscle weakness (generalized): Secondary | ICD-10-CM | POA: Diagnosis not present

## 2017-11-04 DIAGNOSIS — R293 Abnormal posture: Secondary | ICD-10-CM | POA: Diagnosis not present

## 2017-11-07 ENCOUNTER — Encounter (HOSPITAL_COMMUNITY)
Admission: RE | Admit: 2017-11-07 | Discharge: 2017-11-07 | Disposition: A | Discharge status: Home / Self Care | Payer: Medicare Other | Source: Skilled Nursing Facility | Attending: Internal Medicine | Admitting: Internal Medicine

## 2017-11-07 DIAGNOSIS — I10 Essential (primary) hypertension: Secondary | ICD-10-CM | POA: Insufficient documentation

## 2017-11-07 DIAGNOSIS — K562 Volvulus: Secondary | ICD-10-CM | POA: Diagnosis not present

## 2017-11-07 DIAGNOSIS — I69328 Other speech and language deficits following cerebral infarction: Secondary | ICD-10-CM | POA: Insufficient documentation

## 2017-11-07 DIAGNOSIS — R1312 Dysphagia, oropharyngeal phase: Secondary | ICD-10-CM | POA: Diagnosis not present

## 2017-11-07 DIAGNOSIS — R293 Abnormal posture: Secondary | ICD-10-CM | POA: Insufficient documentation

## 2017-11-07 DIAGNOSIS — M6281 Muscle weakness (generalized): Secondary | ICD-10-CM | POA: Diagnosis not present

## 2017-11-07 LAB — PROTIME-INR
INR: 2.94
PROTHROMBIN TIME: 30.4 s — AB (ref 11.4–15.2)

## 2017-11-08 DIAGNOSIS — I69328 Other speech and language deficits following cerebral infarction: Secondary | ICD-10-CM | POA: Diagnosis not present

## 2017-11-08 DIAGNOSIS — R293 Abnormal posture: Secondary | ICD-10-CM | POA: Diagnosis not present

## 2017-11-08 DIAGNOSIS — M6281 Muscle weakness (generalized): Secondary | ICD-10-CM | POA: Diagnosis not present

## 2017-11-09 DIAGNOSIS — I69328 Other speech and language deficits following cerebral infarction: Secondary | ICD-10-CM | POA: Diagnosis not present

## 2017-11-09 DIAGNOSIS — R293 Abnormal posture: Secondary | ICD-10-CM | POA: Diagnosis not present

## 2017-11-09 DIAGNOSIS — M6281 Muscle weakness (generalized): Secondary | ICD-10-CM | POA: Diagnosis not present

## 2017-11-14 ENCOUNTER — Other Ambulatory Visit (HOSPITAL_COMMUNITY)
Admission: RE | Admit: 2017-11-14 | Discharge: 2017-11-14 | Disposition: A | Discharge status: Home / Self Care | Payer: Medicare Other | Source: Skilled Nursing Facility | Attending: Internal Medicine | Admitting: Internal Medicine

## 2017-11-14 DIAGNOSIS — Z7901 Long term (current) use of anticoagulants: Secondary | ICD-10-CM | POA: Insufficient documentation

## 2017-11-14 LAB — PROTIME-INR
INR: 3.18
Prothrombin Time: 32.3 seconds — ABNORMAL HIGH (ref 11.4–15.2)

## 2017-11-16 ENCOUNTER — Encounter (HOSPITAL_COMMUNITY)
Admission: RE | Admit: 2017-11-16 | Discharge: 2017-11-16 | Disposition: A | Discharge status: Home / Self Care | Payer: Medicare Other | Source: Skilled Nursing Facility | Attending: Internal Medicine | Admitting: Internal Medicine

## 2017-11-16 DIAGNOSIS — Z7901 Long term (current) use of anticoagulants: Secondary | ICD-10-CM | POA: Insufficient documentation

## 2017-11-16 DIAGNOSIS — R293 Abnormal posture: Secondary | ICD-10-CM | POA: Diagnosis not present

## 2017-11-16 DIAGNOSIS — R1312 Dysphagia, oropharyngeal phase: Secondary | ICD-10-CM | POA: Diagnosis not present

## 2017-11-16 DIAGNOSIS — M6281 Muscle weakness (generalized): Secondary | ICD-10-CM | POA: Diagnosis not present

## 2017-11-16 DIAGNOSIS — I10 Essential (primary) hypertension: Secondary | ICD-10-CM | POA: Insufficient documentation

## 2017-11-16 DIAGNOSIS — I69328 Other speech and language deficits following cerebral infarction: Secondary | ICD-10-CM | POA: Insufficient documentation

## 2017-11-16 DIAGNOSIS — K562 Volvulus: Secondary | ICD-10-CM | POA: Diagnosis not present

## 2017-11-16 LAB — CBC
HEMATOCRIT: 48.5 % (ref 39.0–52.0)
Hemoglobin: 15.9 g/dL (ref 13.0–17.0)
MCH: 31.2 pg (ref 26.0–34.0)
MCHC: 32.8 g/dL (ref 30.0–36.0)
MCV: 95.3 fL (ref 78.0–100.0)
PLATELETS: 269 10*3/uL (ref 150–400)
RBC: 5.09 MIL/uL (ref 4.22–5.81)
RDW: 13.7 % (ref 11.5–15.5)
WBC: 8.2 10*3/uL (ref 4.0–10.5)

## 2017-11-16 LAB — PROTIME-INR
INR: 2.08
Prothrombin Time: 23.2 seconds — ABNORMAL HIGH (ref 11.4–15.2)

## 2017-11-16 LAB — BASIC METABOLIC PANEL
ANION GAP: 8 (ref 5–15)
BUN: 20 mg/dL (ref 6–20)
CALCIUM: 8.7 mg/dL — AB (ref 8.9–10.3)
CO2: 28 mmol/L (ref 22–32)
Chloride: 102 mmol/L (ref 101–111)
Creatinine, Ser: 1.07 mg/dL (ref 0.61–1.24)
Glucose, Bld: 82 mg/dL (ref 65–99)
POTASSIUM: 4.1 mmol/L (ref 3.5–5.1)
Sodium: 138 mmol/L (ref 135–145)

## 2017-11-16 LAB — LIPID PANEL
CHOL/HDL RATIO: 2.6 ratio
Cholesterol: 111 mg/dL (ref 0–200)
HDL: 42 mg/dL (ref >40)
LDL Cholesterol: 58 mg/dL (ref 0–99)
TRIGLYCERIDES: 54 mg/dL (ref <150)
VLDL: 11 mg/dL (ref 0–40)

## 2017-11-17 ENCOUNTER — Encounter (HOSPITAL_COMMUNITY)
Admission: RE | Admit: 2017-11-17 | Discharge: 2017-11-17 | Disposition: A | Discharge status: Home / Self Care | Payer: Medicare Other | Source: Skilled Nursing Facility | Attending: Internal Medicine | Admitting: Internal Medicine

## 2017-11-17 DIAGNOSIS — R293 Abnormal posture: Secondary | ICD-10-CM | POA: Insufficient documentation

## 2017-11-17 DIAGNOSIS — I69328 Other speech and language deficits following cerebral infarction: Secondary | ICD-10-CM | POA: Insufficient documentation

## 2017-11-17 DIAGNOSIS — R1312 Dysphagia, oropharyngeal phase: Secondary | ICD-10-CM | POA: Insufficient documentation

## 2017-11-17 DIAGNOSIS — I10 Essential (primary) hypertension: Secondary | ICD-10-CM | POA: Insufficient documentation

## 2017-11-17 DIAGNOSIS — K562 Volvulus: Secondary | ICD-10-CM | POA: Insufficient documentation

## 2017-11-17 DIAGNOSIS — M6281 Muscle weakness (generalized): Secondary | ICD-10-CM | POA: Insufficient documentation

## 2017-11-23 ENCOUNTER — Encounter (HOSPITAL_COMMUNITY)
Admission: RE | Admit: 2017-11-23 | Discharge: 2017-11-23 | Disposition: A | Discharge status: Home / Self Care | Payer: Medicare Other | Source: Skilled Nursing Facility | Attending: Internal Medicine | Admitting: Internal Medicine

## 2017-11-23 DIAGNOSIS — Z7901 Long term (current) use of anticoagulants: Secondary | ICD-10-CM | POA: Insufficient documentation

## 2017-11-23 DIAGNOSIS — Z5181 Encounter for therapeutic drug level monitoring: Secondary | ICD-10-CM | POA: Insufficient documentation

## 2017-11-23 LAB — PROTIME-INR
INR: 1.71
PROTHROMBIN TIME: 19.9 s — AB (ref 11.4–15.2)

## 2017-11-28 ENCOUNTER — Encounter (HOSPITAL_COMMUNITY)
Admission: RE | Admit: 2017-11-28 | Discharge: 2017-11-28 | Disposition: A | Discharge status: Home / Self Care | Payer: Medicare Other | Source: Skilled Nursing Facility | Attending: Internal Medicine | Admitting: Internal Medicine

## 2017-11-28 DIAGNOSIS — I10 Essential (primary) hypertension: Secondary | ICD-10-CM | POA: Insufficient documentation

## 2017-11-28 DIAGNOSIS — R1312 Dysphagia, oropharyngeal phase: Secondary | ICD-10-CM | POA: Insufficient documentation

## 2017-11-28 DIAGNOSIS — K562 Volvulus: Secondary | ICD-10-CM | POA: Insufficient documentation

## 2017-11-28 DIAGNOSIS — M6281 Muscle weakness (generalized): Secondary | ICD-10-CM | POA: Insufficient documentation

## 2017-11-28 DIAGNOSIS — I69328 Other speech and language deficits following cerebral infarction: Secondary | ICD-10-CM | POA: Insufficient documentation

## 2017-11-28 DIAGNOSIS — R293 Abnormal posture: Secondary | ICD-10-CM | POA: Insufficient documentation

## 2017-12-01 ENCOUNTER — Encounter (HOSPITAL_COMMUNITY)
Admission: RE | Admit: 2017-12-01 | Discharge: 2017-12-01 | Disposition: A | Discharge status: Home / Self Care | Payer: Medicare Other | Source: Skilled Nursing Facility | Attending: Internal Medicine | Admitting: Internal Medicine

## 2017-12-01 DIAGNOSIS — M6281 Muscle weakness (generalized): Secondary | ICD-10-CM | POA: Insufficient documentation

## 2017-12-01 DIAGNOSIS — I69328 Other speech and language deficits following cerebral infarction: Secondary | ICD-10-CM | POA: Diagnosis not present

## 2017-12-01 DIAGNOSIS — Z7901 Long term (current) use of anticoagulants: Secondary | ICD-10-CM | POA: Diagnosis not present

## 2017-12-01 DIAGNOSIS — Z79899 Other long term (current) drug therapy: Secondary | ICD-10-CM | POA: Insufficient documentation

## 2017-12-01 DIAGNOSIS — R293 Abnormal posture: Secondary | ICD-10-CM | POA: Insufficient documentation

## 2017-12-01 DIAGNOSIS — K562 Volvulus: Secondary | ICD-10-CM | POA: Diagnosis not present

## 2017-12-01 DIAGNOSIS — R1312 Dysphagia, oropharyngeal phase: Secondary | ICD-10-CM | POA: Diagnosis not present

## 2017-12-01 DIAGNOSIS — I10 Essential (primary) hypertension: Secondary | ICD-10-CM | POA: Insufficient documentation

## 2017-12-01 LAB — PROTIME-INR
INR: 2.38
Prothrombin Time: 25.8 seconds — ABNORMAL HIGH (ref 11.4–15.2)

## 2017-12-07 ENCOUNTER — Encounter (HOSPITAL_COMMUNITY)
Admission: RE | Admit: 2017-12-07 | Discharge: 2017-12-07 | Disposition: A | Discharge status: Home / Self Care | Payer: Medicare Other | Source: Skilled Nursing Facility | Attending: *Deleted | Admitting: *Deleted

## 2017-12-07 DIAGNOSIS — Z5181 Encounter for therapeutic drug level monitoring: Secondary | ICD-10-CM | POA: Diagnosis not present

## 2017-12-07 DIAGNOSIS — Z7901 Long term (current) use of anticoagulants: Secondary | ICD-10-CM | POA: Diagnosis not present

## 2017-12-07 LAB — PROTIME-INR
INR: 2.14
Prothrombin Time: 23.7 seconds — ABNORMAL HIGH (ref 11.4–15.2)

## 2017-12-12 DIAGNOSIS — R1312 Dysphagia, oropharyngeal phase: Secondary | ICD-10-CM | POA: Diagnosis not present

## 2017-12-12 DIAGNOSIS — I69328 Other speech and language deficits following cerebral infarction: Secondary | ICD-10-CM | POA: Diagnosis not present

## 2017-12-13 DIAGNOSIS — R1312 Dysphagia, oropharyngeal phase: Secondary | ICD-10-CM | POA: Diagnosis not present

## 2017-12-13 DIAGNOSIS — I69328 Other speech and language deficits following cerebral infarction: Secondary | ICD-10-CM | POA: Diagnosis not present

## 2017-12-14 ENCOUNTER — Encounter: Payer: Self-pay | Admitting: Internal Medicine

## 2017-12-14 DIAGNOSIS — R1312 Dysphagia, oropharyngeal phase: Secondary | ICD-10-CM | POA: Diagnosis not present

## 2017-12-14 DIAGNOSIS — I69328 Other speech and language deficits following cerebral infarction: Secondary | ICD-10-CM | POA: Diagnosis not present

## 2017-12-14 NOTE — Progress Notes (Signed)
This encounter was created in error - please disregard.

## 2017-12-15 ENCOUNTER — Encounter (HOSPITAL_COMMUNITY)
Admission: RE | Admit: 2017-12-15 | Discharge: 2017-12-15 | Disposition: A | Discharge status: Home / Self Care | Payer: Medicare Other | Source: Skilled Nursing Facility | Attending: Internal Medicine | Admitting: Internal Medicine

## 2017-12-15 DIAGNOSIS — R1312 Dysphagia, oropharyngeal phase: Secondary | ICD-10-CM | POA: Diagnosis not present

## 2017-12-15 DIAGNOSIS — M6281 Muscle weakness (generalized): Secondary | ICD-10-CM | POA: Diagnosis not present

## 2017-12-15 DIAGNOSIS — R293 Abnormal posture: Secondary | ICD-10-CM | POA: Insufficient documentation

## 2017-12-15 DIAGNOSIS — I69328 Other speech and language deficits following cerebral infarction: Secondary | ICD-10-CM | POA: Diagnosis not present

## 2017-12-15 DIAGNOSIS — I10 Essential (primary) hypertension: Secondary | ICD-10-CM | POA: Diagnosis not present

## 2017-12-15 DIAGNOSIS — K562 Volvulus: Secondary | ICD-10-CM | POA: Diagnosis not present

## 2017-12-15 LAB — TSH: TSH: 3.657 u[IU]/mL (ref 0.350–4.500)

## 2017-12-19 DIAGNOSIS — I69328 Other speech and language deficits following cerebral infarction: Secondary | ICD-10-CM | POA: Diagnosis not present

## 2017-12-19 DIAGNOSIS — R1312 Dysphagia, oropharyngeal phase: Secondary | ICD-10-CM | POA: Diagnosis not present

## 2017-12-21 ENCOUNTER — Non-Acute Institutional Stay (SKILLED_NURSING_FACILITY): Payer: Medicare Other | Admitting: Internal Medicine

## 2017-12-21 ENCOUNTER — Encounter: Payer: Self-pay | Admitting: Internal Medicine

## 2017-12-21 ENCOUNTER — Ambulatory Visit (INDEPENDENT_AMBULATORY_CARE_PROVIDER_SITE_OTHER): Payer: Medicare Other | Admitting: "Endocrinology

## 2017-12-21 ENCOUNTER — Encounter (HOSPITAL_COMMUNITY)
Admission: RE | Admit: 2017-12-21 | Discharge: 2017-12-21 | Disposition: A | Discharge status: Home / Self Care | Payer: Medicare Other | Source: Skilled Nursing Facility | Attending: Internal Medicine | Admitting: Internal Medicine

## 2017-12-21 ENCOUNTER — Encounter: Payer: Self-pay | Admitting: "Endocrinology

## 2017-12-21 VITALS — BP 106/63 | HR 82 | Ht 70.0 in

## 2017-12-21 DIAGNOSIS — Z7901 Long term (current) use of anticoagulants: Secondary | ICD-10-CM | POA: Diagnosis not present

## 2017-12-21 DIAGNOSIS — I10 Essential (primary) hypertension: Secondary | ICD-10-CM

## 2017-12-21 DIAGNOSIS — K562 Volvulus: Secondary | ICD-10-CM | POA: Diagnosis not present

## 2017-12-21 DIAGNOSIS — I639 Cerebral infarction, unspecified: Secondary | ICD-10-CM

## 2017-12-21 DIAGNOSIS — I69328 Other speech and language deficits following cerebral infarction: Secondary | ICD-10-CM | POA: Diagnosis not present

## 2017-12-21 DIAGNOSIS — J410 Simple chronic bronchitis: Secondary | ICD-10-CM | POA: Diagnosis not present

## 2017-12-21 DIAGNOSIS — E0590 Thyrotoxicosis, unspecified without thyrotoxic crisis or storm: Secondary | ICD-10-CM

## 2017-12-21 DIAGNOSIS — S0502XS Injury of conjunctiva and corneal abrasion without foreign body, left eye, sequela: Secondary | ICD-10-CM

## 2017-12-21 DIAGNOSIS — I824Y9 Acute embolism and thrombosis of unspecified deep veins of unspecified proximal lower extremity: Secondary | ICD-10-CM

## 2017-12-21 DIAGNOSIS — R1312 Dysphagia, oropharyngeal phase: Secondary | ICD-10-CM | POA: Diagnosis not present

## 2017-12-21 LAB — PROTIME-INR
INR: 1.71
Prothrombin Time: 19.9 seconds — ABNORMAL HIGH (ref 11.4–15.2)

## 2017-12-21 NOTE — Progress Notes (Signed)
Location:   [facility] Room Number: 112/W Place of Service:  SNF [phone]) Provider:  Sabino DickArlo Rawson Minix  Gupta, Anjali L, MD  Patient Care Team: Mahlon GammonGupta, Anjali L, [name]  Extended Emergency Contact Information Primary Emergency Contact: Venetia MaxonWalker,[name] Jr Address: [address]          [place], [place] [address] [place] Home Phone: [phone] Mobile Phone: [phone] Relation: Brother  Code Status:  Full Code Goals of care: Advanced Directive information Advanced Directives 12/21/2017  Does Patient Have a Medical Advance Directive? Yes  Type of Advance Directive (No Data)  Does patient want to make changes to medical advance directive? No - Patient declined  Copy of Healthcare Power of Attorney in Chart? No - copy requested  Would patient like information on creating a medical advance directive? No - Patient declined     Chief Complaint  Patient presents with  . Medical Management of Chronic Issues    This is a Routine Visit   For medical management of chronic medical issues including hypertension- COPD history of CVA- 3 of recurrent pulmonary embolism and DVT on chronic Coumadin- history of recurrent sigmoid volvulus- subclinical hyperthyroidism followed by endocrinology- hyperlipidemia  HPI:  Pt is a 72 y.o. male seen today for medical management of chronic diseases.  As above.  He continues to be quite stable ambulating about hallway in his wheelchair- despite his right-sided hemiparesis.  Nursing does not report any recent acute issues.  At one point he did have hospitalizations for recurrent sigmoid volvulus- but this has stabilized for an extended period of time- he is on MiraLAX and senna and this appears to be effective- he has gained back his weight.  194 pounds which is relatively stable with last month's weight  He does have a history of hypertension which appears to be stable I got a manual reading of  120/80 today- see previous readings 129/83 143/89- 136/82- he is on labetalol 200 mg a day as well as lisinopril 10 mg a day.  In regards to history of PE and DVT as well as CVA he is on chronic Coumadin INR is somewhat low today at 1.71 he is on 3.5 mg a day except 4 mg 2 days a week  He also has a history of a left corneal abrasion and is followed by ophthalmology and is on erythromycin eyedrops.  Regards his COPD is on Incruse Ellipta as well as duo nebs as needed as well as Symbicort - this appears to be stable actually lung sounds are somewhat improved today compared to prior exams.  He also has a history of subclinical thyroidism he is on Tapazole he actually saw endocrinology today and thought to be stable recommendation to continue current dose of Tapazole  Currently has no complaints again vital signs appear to be stable     Past Medical History:  Diagnosis Date  . Dysphasia   . Fatty liver   . Hemiplegia (HCC)   . Hypertension   . Pneumonia   . Pulmonary embolism (HCC)   . 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(HCC)   . Stroke [facility](HCC)    Past Surgical History:  Procedure Laterality Date  . FLEXIBLE SIGMOIDOSCOPY N/A 10/21/2016   Procedure: FLEXIBLE SIGMOIDOSCOPY;  Surgeon: Malissa Hippoehman, Najeeb U, MD;  Location: [facility];  Service: Endoscopy;  Laterality: N/A;  . FLEXIBLE SIGMOIDOSCOPY N/A 10/25/2016   Procedure: FLEXIBLE SIGMOIDOSCOPY;  Surgeon: Malissa Hippoehman, Najeeb U, MD;  Location: [facility];  Service: Endoscopy;  Laterality: N/A;  . unable      Allergies  Allergen Reactions  . Penicillins     Has patient had a PCN reaction causing immediate rash, facial/tongue/throat swelling, SOB or lightheadedness with hypotension: unknown Has patient had a PCN reaction causing severe rash involving mucus membranes or skin necrosis: unknown Has patient had a PCN reaction that required hospitalization: unknown Has patient had a PCN reaction occurring within the last 10 years: unknown If all of the above  answers are "NO", then may proceed with Cephalosporin use. 5/3 Tolerated Cefepime x 1 dose in ED.     Outpatient Encounter Medications as of 12/21/2017  Medication Sig  . acetaminophen (TYLENOL) 325 MG tablet Take 650 mg by mouth every 6 (six) hours as needed for moderate pain.   Marland Kitchen atorvastatin (LIPITOR) 10 MG tablet Take 10 mg by mouth daily.  . Bisacodyl (DULCOLAX RE) Place rectally. 1 per rectum ; rectal hemorrhoids at bedtime  . budesonide-formoterol (SYMBICORT) 80-4.5 MCG/ACT inhaler Inhale 2 puffs into the lungs 2 (two) times daily.  . carboxymethylcellulose (REFRESH PLUS) 0.5 % SOLN Place 1 drop into both eyes 3 (three) times daily.   Marland Kitchen erythromycin ophthalmic ointment appy 1 ribbon ophthalmic (eye) 5 times a day  . ipratropium (ATROVENT) 0.02 % nebulizer solution Take 0.5 mg by nebulization every 6 (six) hours.  Marland Kitchen labetalol (NORMODYNE) 200 MG tablet Take 200 mg by mouth daily. For HTN  . lisinopril (PRINIVIL,ZESTRIL) 10 MG tablet Take 10 mg by mouth daily.  Marland Kitchen loratadine (CLARITIN) 10 MG tablet Take 10 mg by mouth daily.  . methimazole (TAPAZOLE) 5 MG tablet Take 5 mg by mouth daily.  . polyethylene glycol (MIRALAX / GLYCOLAX) packet Take 17 g by mouth at bedtime.  . sennosides-docusate sodium (SENOKOT-S) 8.6-50 MG tablet Take 1 tablet by mouth at bedtime. For constipation  . umeclidinium bromide (INCRUSE ELLIPTA) 62.5 MCG/INH AEPB Inhale 1 puff into the lungs daily.  Marland Kitchen warfarin (COUMADIN) 1 MG tablet Take  1 mg  tablet by mouth along with 2.5 mg to = 3.5 mg once a day on Sun.,  Mon., Tues., Thurs.,Fri.  . warfarin (COUMADIN) 2.5 MG tablet Take 2.5 mg along with 1 mg to = 3.5 mg on Mon., Tues., Thurs., Fri.  . warfarin (COUMADIN) 4 MG tablet Take 4 mg tablet by mouth once a day on Wed., Sat.  . [DISCONTINUED] betamethasone dipropionate (DIPROLENE) 0.05 % cream Apply to legs and hands. Do not apply to face groin, axilla. Try Cerave first twice a day.  . [DISCONTINUED] Emollient (CERAVE)  CREA Apply 1 application topically daily as needed for itching once a day  . [DISCONTINUED] Eyelid Cleansers (SYSTANE LID WIPES EX) Apply 1 application topically 2 (two) times daily. Start occusoft lid scrubs: clean eyelids of oil and debris BID OU ( each eye ) before administering eye drops.   . [DISCONTINUED] warfarin (COUMADIN) 3 MG tablet Take 3 mg tablet by mouth along with 1/2 tablet of (1 mg) to = 3.5 mg once a day on Mon., Wed., Fri.   No facility-administered encounter medications on file as of 12/21/2017.      Review of Systems   In general is not complaining of any fever chills weight appears to be relatively stabilized in the mid 190s.  Skin is not complaining of rashes or itching.  Eyes does have opacity of his left eye at baseline he is not complaining of any visual deficits from baseline.  Ears nose mouth and  throat does not complain of sore throat or nasal discharge.  Respiratory is not complaining of shortness of breath or cough-does have a history of COPD.  Cardiac does not complain of chest pain or palpitations does have chronic venous stasis edema.  GI is not complaining of abdominal pain nausea vomiting diarrhea or constipation. History of recurrent sigmoid volvulus but  stable for a considerable amount of time  GU is not complaining of dysuria.  Muscle skeletal does not complain of joint pain.  Neurologic is not complaint dizziness headache or numbness does have right-sided hemiparesis status post CVA.  Psych does not complain of being anxious or depressed.    Immunization History  Administered Date(s) Administered  . Influenza-Unspecified 03/28/2014, 03/23/2016, 03/25/2017  . Pneumococcal Conjugate-13 04/21/2015  . Pneumococcal-Unspecified 11/01/2009, 03/30/2016  . Tdap 03/17/2017   Pertinent  Health Maintenance Due  Topic Date Due  . COLONOSCOPY  10/30/2025 (Originally 11/08/1995)  . INFLUENZA VACCINE  01/19/2018  . PNA vac Low Risk Adult  Completed     Fall Risk  04/05/2017 03/17/2017  Falls in the past year? No No  Risk for fall due to : Impaired balance/gait;Impaired mobility;Medication side effect;Impaired vision;History of fall(s) -   Functional Status Survey:    Vitals:   12/21/17 1525  BP: 136/82  Pulse: 96  Resp: 20  Temp: 97.8 F (36.6 C)  TempSrc: Oral  SpO2: 94%  Weight: 194 lb 6.4 oz (88.2 kg)  Height: 5' 8.5" (1.74 m)  Of note manual blood pressure today was 120/80 Body mass index is 29.13 kg/m. Physical Exam  General this is a pleasant elderly male in no distress he is in his wheelchair sitting comfortably.  His skin is warm and dry.  Eyes she does have continued opacity of his left eye-right eye visual acuity appears grossly intact he does have prescription lenses.  Oropharynx is clear mucous membranes moist.  Chest he has shallow air entry he has very minimal coarse breath sounds this is largely improved from several previous exams-  Heart is regular rate and rhythm without murmur gallop or rub he has chronic venous stasis edema at baseline.  Abdomen is soft nontender with positive bowel sounds.  Musculoskeletal- continues with right-sided hemiparesis with contracture of his right hand- left upper extremity strength and lower extremity strength appears to be intact -- largely ambulates in the wheelchair using these and does quite well   Neurologic-as noted above has right-sided hemiparalysis with slightly slurred speech at baseline.  Psych he is alert and oriented pleasant and appropriate   Labs reviewed: Recent Labs    07/19/17 0709 09/19/17 0630 11/16/17 0709  NA 139 139 138  K 4.2 3.9 4.1  CL 102 103 102  CO2 24 26 28   GLUCOSE 80 78 82  BUN 20 19 20   CREATININE 1.02 1.11 1.07  CALCIUM 8.9 8.7* 8.7*   Recent Labs    07/19/17 0709  AST 25  ALT 29  ALKPHOS 79  BILITOT 0.7  PROT 7.3  ALBUMIN 3.3*   Recent Labs    03/09/17 0711 07/19/17 0709 11/16/17 0709  WBC 7.7 7.5 8.2   NEUTROABS 4.5 4.1  --   HGB 15.1 15.5 15.9  HCT 46.2 48.6 48.5  MCV 91.5 93.8 95.3  PLT 269 297 269   Lab Results  Component Value Date   TSH 3.657 12/15/2017   Lab Results  Component Value Date   HGBA1C  03/27/2009    6.0 (NOTE) The ADA recommends the following  therapeutic goal for glycemic control related to Hgb A1c measurement: Goal of therapy: <6.5 Hgb A1c  Reference: American Diabetes Association: Clinical Practice Recommendations 2010, Diabetes Care, 2010, 33: (Suppl  1).   Lab Results  Component Value Date   CHOL 111 11/16/2017   HDL 42 11/16/2017   LDLCALC 58 11/16/2017   TRIG 54 11/16/2017   CHOLHDL 2.6 11/16/2017    Significant Diagnostic Results in last 30 days:  No results found.  Assessment/Plan  #1- history of CVA with right-sided hemiparalysis-he appears to be doing well with supportive care continues to ambulate in his wheelchair--INR slightly subtherapeutic will increase Coumadin to 4 mg a day and recheck this on Monday, July 8.  2.  History of recurrent DVT and PE- again is on Coumadin adjustments are being made as noted above.  3.  History of COPD this appears stable on current medications including Incruse Ellipta Symbicort duo nebs as needed--lung exam was fairly benign today.  4.  Hypertension this appears relatively well controlled on labetalol and low-dose lisinopril.  5.  History of recurrent sigmoid volvulus-again this is largely resolved and been stable now for an extended period of time continues on MiraLAX and senna.  6.  History of subclinical hyperthyroidism he is on Tapazole actually saw endocrinology today and thought to be doing well.  7.  History of left corneal abrasion this is followed by ophthalmology continues on erythromycin eyedrops  8.  History of hyperlipidemia continues on a statin most recent LDL was satisfactory at 58.  9.  History of allergic rhinitis continues on routine Claritin and has tolerated this well.  10  history of hemorrhoids continues on Anusol nightly this appears to be effective.  [id number]

## 2017-12-21 NOTE — Progress Notes (Signed)
Subjective:    Patient ID: [name] [name], male    DOB: 08-Oct-1945, PCP Mahlon Gammon, MD   Past Medical History:  Diagnosis Date  . Dysphasia   . Fatty liver   . Hemiplegia (HCC)   . Hypertension   . Pneumonia   . Pulmonary embolism (HCC)   . 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(HCC)   . Stroke [facility])    Past Surgical History:  Procedure Laterality Date  . FLEXIBLE SIGMOIDOSCOPY N/A 10/21/2016   Procedure: FLEXIBLE SIGMOIDOSCOPY;  Surgeon: Malissa Hippo, MD;  Location: [facility];  Service: Endoscopy;  Laterality: N/A;  . FLEXIBLE SIGMOIDOSCOPY N/A 10/25/2016   Procedure: FLEXIBLE SIGMOIDOSCOPY;  Surgeon: Malissa Hippo, MD;  Location: [facility];  Service: Endoscopy;  Laterality: N/A;  . unable     Social History   Socioeconomic History  . Marital status: Single    Spouse name: Not on file  . Number of children: Not on file  . Years of education: Not on file  . Highest education level: Not on file  Occupational History  . Not on file  Social Needs  . Financial resource strain: Not on file  . Food insecurity:    Worry: Not on file    Inability: Not on file  . Transportation needs:    Medical: Not on file    Non-medical: Not on file  Tobacco Use  . Smoking status: Never Smoker  . Smokeless tobacco: Never Used  Substance and Sexual Activity  . Alcohol use: No    Alcohol/week: 0.0 oz  . Drug use: No  . Sexual activity: Not on file  Lifestyle  . Physical activity:    Days per week: Not on file    Minutes per session: Not on file  . Stress: Not on file  Relationships  . Social connections:    Talks on phone: Not on file    Gets together: Not on file    Attends religious service: Not on file    Active member of club or organization: Not on file    Attends meetings of clubs or organizations: Not on file    Relationship status: Not on file  Other Topics Concern  . Not on file  Social History Narrative  . Not on file   Outpatient Encounter Medications as of  12/21/2017  Medication Sig  . acetaminophen (TYLENOL) 325 MG tablet Take 650 mg by mouth every 6 (six) hours as needed for moderate pain.   Marland Kitchen atorvastatin (LIPITOR) 10 MG tablet Take 10 mg by mouth daily.  . betamethasone dipropionate (DIPROLENE) 0.05 % cream Apply to legs and hands. Do not apply to face groin, axilla. Try Cerave first twice a day.  . Bisacodyl (DULCOLAX RE) Place rectally. 1 per rectum ; rectal hemorrhoids at bedtime  . budesonide-formoterol (SYMBICORT) 80-4.5 MCG/ACT inhaler Inhale 2 puffs into the lungs 2 (two) times daily.  . carboxymethylcellulose (REFRESH PLUS) 0.5 % SOLN Place 1 drop into both eyes 3 (three) times daily.   . Emollient (CERAVE) CREA Apply 1 application topically daily as needed for itching once a day  . erythromycin ophthalmic ointment appy 1 ribbon ophthalmic (eye) 5 times a day  . Eyelid Cleansers (SYSTANE LID WIPES EX) Apply 1 application topically 2 (two) times daily. Start occusoft lid scrubs: clean eyelids of oil and debris BID OU ( each eye ) before administering eye drops.   Marland Kitchen ipratropium (ATROVENT) 0.02 % nebulizer solution Take 0.5 mg by nebulization every  6 (six) hours.  Marland Kitchen labetalol (NORMODYNE) 200 MG tablet Take 200 mg by mouth daily. For HTN  . loratadine (CLARITIN) 10 MG tablet Take 10 mg by mouth daily.  . methimazole (TAPAZOLE) 5 MG tablet Take 5 mg by mouth daily.  . polyethylene glycol (MIRALAX / GLYCOLAX) packet Take 17 g by mouth at bedtime.  . sennosides-docusate sodium (SENOKOT-S) 8.6-50 MG tablet Take 1 tablet by mouth at bedtime. For constipation  . umeclidinium bromide (INCRUSE ELLIPTA) 62.5 MCG/INH AEPB Inhale 1 puff into the lungs daily.  Marland Kitchen warfarin (COUMADIN) 1 MG tablet Take 1/2 of ( 1 mg ) tablet by mouth along with 3 mg to = 3.5 mg once a day on  Mon., Wed., Fri.  . warfarin (COUMADIN) 3 MG tablet Take 3 mg tablet by mouth along with 1/2 tablet of (1 mg) to = 3.5 mg once a day on Mon., Wed., Fri.  . warfarin (COUMADIN) 4 MG  tablet Take 4 mg tablet by mouth once a day on Sun., Tues., Thus., Sat.   No facility-administered encounter medications on file as of 12/21/2017.    ALLERGIES: Allergies  Allergen Reactions  . Penicillins     Has patient had a PCN reaction causing immediate rash, facial/tongue/throat swelling, SOB or lightheadedness with hypotension: unknown Has patient had a PCN reaction causing severe rash involving mucus membranes or skin necrosis: unknown Has patient had a PCN reaction that required hospitalization: unknown Has patient had a PCN reaction occurring within the last 10 years: unknown If all of the above answers are "NO", then may proceed with Cephalosporin use. 5/3 Tolerated Cefepime x 1 dose in ED.     VACCINATION STATUS: Immunization History  Administered Date(s) Administered  . Influenza-Unspecified 03/28/2014, 03/23/2016, 03/25/2017  . Pneumococcal Conjugate-13 04/21/2015  . Pneumococcal-Unspecified 11/01/2009, 03/30/2016  . Tdap 03/17/2017    HPI [name] [name] is 72 y.o. male who presents today with a medical history as above. - He is being treated with methimazole 5 mg daily for hyperthyroidism.  He has responded to treatment very well.   -  He has multiple medical problems including stroke with right-sided hemiplegia, nursing home resident. - He also has dysphagia, was found to be losing weight, currently reversing. - He was sent for thyroid uptake and scan which showed nonfocal elevated uptake of 42%.  He is unable to provide history, accompanied by his brother not aware of the details of his medical problems. he has been dealing with symptoms of  loss of weight, sleep disturbance prior to initiation of treatment. 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   Review of Systems  Constitutional: + weight gain,  + fatigue, no subjective hyperthermia, no subjective  hypothermia, + wheelchair-bound.  Eyes: no blurry vision, no xerophthalmia ENT: no sore throat, no nodules palpated in throat, no dysphagia/odynophagia, no hoarseness Cardiovascular: no chest pain, no palpitations.   Respiratory: no cough, no SOB Gastrointestinal: no Nausea/Vomiting/Diarhhea Musculoskeletal: + Wheelchair-bound ,  stiff muscles on extremities.  Skin: no rashes Neurological: no tremors, no numbness, no tingling, no dizziness Psychiatric: + depression, no anxiety  Objective:    BP 106/63   Pulse 82   Ht 5\' 10"  (1.778 m)   BMI 27.69 kg/m   Wt Readings from Last 3 Encounters:  11/03/17 193 lb (87.5 kg)  10/11/17 190 lb 6.4 oz (86.4 kg)  09/12/17 184 lb (83.5 kg)  Physical Exam  Constitutional: Not in acute distress,  + recliner/wheelchair-bound, with stiff and spastic right upper and lower extremities Eyes: PERRLA, EOMI, no exophthalmos, + drooling ENT: moist mucous membranes, no thyromegaly, no cervical lymphadenopathy CVS: Regular rate and rhythm Musculoskeletal: + + Wheelchair-bound, with stiff and spastic right upper and lower extremities Skin: moist, warm, no rashes Neurological: No tremors on outstretched hands,  Deep tendon reflexes normal in all four extremities.  CMP ( most recent) CMP     Component Value Date/Time   NA 138 11/16/2017 0709   NA 142 09/12/2015   K 4.1 11/16/2017 0709   CL 102 11/16/2017 0709   CO2 28 11/16/2017 0709   GLUCOSE 82 11/16/2017 0709   BUN 20 11/16/2017 0709   BUN 18 09/12/2015   CREATININE 1.07 11/16/2017 0709   CALCIUM 8.7 (L) 11/16/2017 0709   PROT 7.3 07/19/2017 0709   ALBUMIN 3.3 (L) 07/19/2017 0709   AST 25 07/19/2017 0709   ALT 29 07/19/2017 0709   ALKPHOS 79 07/19/2017 0709   BILITOT 0.7 07/19/2017 0709   GFRNONAA >60 11/16/2017 0709   GFRAA >60 11/16/2017 0709     Diabetic Labs (most recent): Lab Results  Component Value Date   HGBA1C  03/27/2009    6.0 (NOTE) The ADA recommends the following  therapeutic goal for glycemic control related to Hgb A1c measurement: Goal of therapy: <6.5 Hgb A1c  Reference: American Diabetes Association: Clinical Practice Recommendations 2010, Diabetes Care, 2010, 33: (Suppl  1).   HGBA1C 6 06/22/2008     Lipid Panel ( most recent) Lipid Panel     Component Value Date/Time   CHOL 111 11/16/2017 0700   TRIG 54 11/16/2017 0700   HDL 42 11/16/2017 0700   CHOLHDL 2.6 11/16/2017 0700   VLDL 11 11/16/2017 0700   LDLCALC 58 11/16/2017 0700     Thyroid uptake and scan showed nonfocal, elevated uptake of 42%   Results for Croom DesanctisWALKER, Riggins T (MRN [id number]) as of 12/21/2017 10:01  Ref. Range 09/13/2017 10:35 09/13/2017 10:36 12/15/2017 07:15  TSH Latest Ref Range: 0.350 - 4.500 uIU/mL   3.657  Triiodothyronine,Free,Serum Latest Ref Range: 2.0 - 4.4 pg/mL  2.0   Triiodothyronine (T3) Latest Ref Range: 71 - 180 ng/dL 82    Thyroxine (T4) Latest Ref Range: 4.5 - 12.0 ug/dL 5.7       Assessment & Plan:   1.  Hyperthyroidism -He is responding to methimazole treatment. -He will be continued on the same dose of methimazole 5 mg p.o. every morning with breakfast and plan to repeat thyroid function test (TSH and free T4) in 4 months with office visit. -His tachycardia has resolved, on labetalol 200 mg daily.  - I advised patient to maintain close follow up with Mahlon GammonGupta, Anjali L, MD for  primary care needs.  Follow up plan: Return in about 4 months (around 04/23/2018) for follow up with pre-visit labs.  Marquis LunchGebre Skyelyn Scruggs, MD [facility] Phone: [phone]  Fax: [phone]   12/21/2017, 10:08 AM This note was partially dictated with voice recognition software. Similar sounding words can be transcribed inadequately or may not  be corrected upon review.

## 2017-12-23 DIAGNOSIS — I69328 Other speech and language deficits following cerebral infarction: Secondary | ICD-10-CM | POA: Diagnosis not present

## 2017-12-23 DIAGNOSIS — R1312 Dysphagia, oropharyngeal phase: Secondary | ICD-10-CM | POA: Diagnosis not present

## 2017-12-26 ENCOUNTER — Encounter (HOSPITAL_COMMUNITY)
Admission: RE | Admit: 2017-12-26 | Discharge: 2017-12-26 | Disposition: A | Discharge status: Home / Self Care | Payer: Medicare Other | Source: Skilled Nursing Facility | Attending: *Deleted | Admitting: *Deleted

## 2017-12-26 DIAGNOSIS — Z7901 Long term (current) use of anticoagulants: Secondary | ICD-10-CM | POA: Diagnosis not present

## 2017-12-26 LAB — PROTIME-INR
INR: 2.12
Prothrombin Time: 23.6 seconds — ABNORMAL HIGH (ref 11.4–15.2)

## 2017-12-27 DIAGNOSIS — I69328 Other speech and language deficits following cerebral infarction: Secondary | ICD-10-CM | POA: Diagnosis not present

## 2017-12-27 DIAGNOSIS — R1312 Dysphagia, oropharyngeal phase: Secondary | ICD-10-CM | POA: Diagnosis not present

## 2017-12-28 DIAGNOSIS — I69328 Other speech and language deficits following cerebral infarction: Secondary | ICD-10-CM | POA: Diagnosis not present

## 2017-12-28 DIAGNOSIS — R1312 Dysphagia, oropharyngeal phase: Secondary | ICD-10-CM | POA: Diagnosis not present

## 2017-12-29 DIAGNOSIS — I69328 Other speech and language deficits following cerebral infarction: Secondary | ICD-10-CM | POA: Diagnosis not present

## 2017-12-29 DIAGNOSIS — R1312 Dysphagia, oropharyngeal phase: Secondary | ICD-10-CM | POA: Diagnosis not present

## 2017-12-30 DIAGNOSIS — R1312 Dysphagia, oropharyngeal phase: Secondary | ICD-10-CM | POA: Diagnosis not present

## 2017-12-30 DIAGNOSIS — I69328 Other speech and language deficits following cerebral infarction: Secondary | ICD-10-CM | POA: Diagnosis not present

## 2018-01-02 ENCOUNTER — Other Ambulatory Visit (HOSPITAL_COMMUNITY)
Admission: RE | Admit: 2018-01-02 | Discharge: 2018-01-02 | Disposition: A | Discharge status: Home / Self Care | Payer: Medicare Other | Source: Skilled Nursing Facility | Attending: Internal Medicine | Admitting: Internal Medicine

## 2018-01-02 DIAGNOSIS — I10 Essential (primary) hypertension: Secondary | ICD-10-CM | POA: Diagnosis not present

## 2018-01-02 DIAGNOSIS — I739 Peripheral vascular disease, unspecified: Secondary | ICD-10-CM | POA: Diagnosis not present

## 2018-01-02 DIAGNOSIS — I803 Phlebitis and thrombophlebitis of lower extremities, unspecified: Secondary | ICD-10-CM | POA: Diagnosis not present

## 2018-01-02 DIAGNOSIS — R293 Abnormal posture: Secondary | ICD-10-CM | POA: Insufficient documentation

## 2018-01-02 DIAGNOSIS — I69328 Other speech and language deficits following cerebral infarction: Secondary | ICD-10-CM | POA: Insufficient documentation

## 2018-01-02 DIAGNOSIS — M6281 Muscle weakness (generalized): Secondary | ICD-10-CM | POA: Insufficient documentation

## 2018-01-02 DIAGNOSIS — Z7901 Long term (current) use of anticoagulants: Secondary | ICD-10-CM | POA: Diagnosis not present

## 2018-01-02 DIAGNOSIS — B351 Tinea unguium: Secondary | ICD-10-CM | POA: Diagnosis not present

## 2018-01-02 DIAGNOSIS — R1312 Dysphagia, oropharyngeal phase: Secondary | ICD-10-CM | POA: Insufficient documentation

## 2018-01-02 DIAGNOSIS — K562 Volvulus: Secondary | ICD-10-CM | POA: Insufficient documentation

## 2018-01-02 LAB — PROTIME-INR
INR: 1.93
Prothrombin Time: 21.9 seconds — ABNORMAL HIGH (ref 11.4–15.2)

## 2018-01-03 DIAGNOSIS — I69328 Other speech and language deficits following cerebral infarction: Secondary | ICD-10-CM | POA: Diagnosis not present

## 2018-01-03 DIAGNOSIS — R1312 Dysphagia, oropharyngeal phase: Secondary | ICD-10-CM | POA: Diagnosis not present

## 2018-01-04 DIAGNOSIS — I69328 Other speech and language deficits following cerebral infarction: Secondary | ICD-10-CM | POA: Diagnosis not present

## 2018-01-04 DIAGNOSIS — R1312 Dysphagia, oropharyngeal phase: Secondary | ICD-10-CM | POA: Diagnosis not present

## 2018-01-05 DIAGNOSIS — I69328 Other speech and language deficits following cerebral infarction: Secondary | ICD-10-CM | POA: Diagnosis not present

## 2018-01-05 DIAGNOSIS — R1312 Dysphagia, oropharyngeal phase: Secondary | ICD-10-CM | POA: Diagnosis not present

## 2018-01-06 DIAGNOSIS — I69328 Other speech and language deficits following cerebral infarction: Secondary | ICD-10-CM | POA: Diagnosis not present

## 2018-01-06 DIAGNOSIS — R1312 Dysphagia, oropharyngeal phase: Secondary | ICD-10-CM | POA: Diagnosis not present

## 2018-01-09 ENCOUNTER — Encounter (HOSPITAL_COMMUNITY)
Admission: RE | Admit: 2018-01-09 | Discharge: 2018-01-09 | Disposition: A | Discharge status: Home / Self Care | Payer: Medicare Other | Source: Skilled Nursing Facility | Attending: *Deleted | Admitting: *Deleted

## 2018-01-09 DIAGNOSIS — Z7901 Long term (current) use of anticoagulants: Secondary | ICD-10-CM | POA: Diagnosis not present

## 2018-01-09 DIAGNOSIS — I69328 Other speech and language deficits following cerebral infarction: Secondary | ICD-10-CM | POA: Diagnosis not present

## 2018-01-09 DIAGNOSIS — R1312 Dysphagia, oropharyngeal phase: Secondary | ICD-10-CM | POA: Diagnosis not present

## 2018-01-09 LAB — PROTIME-INR
INR: 2.32
Prothrombin Time: 25.3 seconds — ABNORMAL HIGH (ref 11.4–15.2)

## 2018-01-10 DIAGNOSIS — I69328 Other speech and language deficits following cerebral infarction: Secondary | ICD-10-CM | POA: Diagnosis not present

## 2018-01-10 DIAGNOSIS — R1312 Dysphagia, oropharyngeal phase: Secondary | ICD-10-CM | POA: Diagnosis not present

## 2018-01-11 DIAGNOSIS — I69328 Other speech and language deficits following cerebral infarction: Secondary | ICD-10-CM | POA: Diagnosis not present

## 2018-01-11 DIAGNOSIS — R1312 Dysphagia, oropharyngeal phase: Secondary | ICD-10-CM | POA: Diagnosis not present

## 2018-01-12 DIAGNOSIS — R1312 Dysphagia, oropharyngeal phase: Secondary | ICD-10-CM | POA: Diagnosis not present

## 2018-01-12 DIAGNOSIS — I69328 Other speech and language deficits following cerebral infarction: Secondary | ICD-10-CM | POA: Diagnosis not present

## 2018-01-13 ENCOUNTER — Non-Acute Institutional Stay (SKILLED_NURSING_FACILITY): Payer: Medicare Other | Admitting: Internal Medicine

## 2018-01-13 DIAGNOSIS — I69328 Other speech and language deficits following cerebral infarction: Secondary | ICD-10-CM | POA: Diagnosis not present

## 2018-01-13 DIAGNOSIS — R05 Cough: Secondary | ICD-10-CM

## 2018-01-13 DIAGNOSIS — J410 Simple chronic bronchitis: Secondary | ICD-10-CM

## 2018-01-13 DIAGNOSIS — R1312 Dysphagia, oropharyngeal phase: Secondary | ICD-10-CM | POA: Diagnosis not present

## 2018-01-13 DIAGNOSIS — R059 Cough, unspecified: Secondary | ICD-10-CM

## 2018-01-13 NOTE — Progress Notes (Signed)
This is an acute visit  Level of care is skilled.  Facility is [facility].  Complaint-acute visit follow-up possible cough.  History of present illness.  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  He does have a long-term history of COPD but has done well with Incruse Ellipta every day as well as duo nebs as needed.  He is also on Symbicort  Yesterday it was thought he had somewhat of an increased cough but today nursing states he is at his baseline occasional cough but this is nothing out of the ordinary.  He is not complaining of any increased cough or shortness of breath.  Vital signs are stable O2 saturations are in the 90s on room air.  Past Medical History:  Diagnosis Date  . Dysphasia   . Fatty liver   . Hemiplegia (HCC)   . Hypertension   . Pneumonia   . Pulmonary embolism (HCC)   . 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(HCC)   . Stroke [facility])         Past Surgical History:  Procedure Laterality Date  . FLEXIBLE SIGMOIDOSCOPY N/A 10/21/2016   Procedure: FLEXIBLE SIGMOIDOSCOPY;  Surgeon: Malissa Hippo, MD;  [facility]: [facility];  Service: Endoscopy;  Laterality: N/A;  . FLEXIBLE SIGMOIDOSCOPY N/A 10/25/2016   Procedure: FLEXIBLE SIGMOIDOSCOPY;  Surgeon: Malissa Hippo, MD;  Location: [facility];  Service: Endoscopy;  Laterality: N/A;  . unable           Allergies  Allergen Reactions  . Penicillins     Has patient had a PCN reaction causing immediate rash, facial/tongue/throat swelling, SOB or lightheadedness with hypotension: unknown Has patient had a PCN reaction causing severe rash involving mucus membranes or skin necrosis: unknown Has patient had a PCN reaction that required hospitalization: unknown Has patient had a PCN reaction occurring  within the last 10 years: unknown If all of the above answers are "NO", then may proceed with Cephalosporin use. 5/3 Tolerated Cefepime x 1 dose in ED.         Outpatient Encounter Medications as of 12/21/2017  Medication Sig  . acetaminophen (TYLENOL) 325 MG tablet Take 650 mg by mouth every 6 (six) hours as needed for moderate pain.   Marland Kitchen atorvastatin (LIPITOR) 10 MG tablet Take 10 mg by mouth daily.  . Bisacodyl (DULCOLAX RE) Place rectally. 1 per rectum ; rectal hemorrhoids at bedtime  . budesonide-formoterol (SYMBICORT) 80-4.5 MCG/ACT inhaler Inhale 2 puffs into the lungs 2 (two) times daily.  . carboxymethylcellulose (REFRESH PLUS) 0.5 % SOLN Place 1 drop into both eyes 3 (three) times daily.   Marland Kitchen erythromycin ophthalmic ointment appy 1 ribbon ophthalmic (eye) 5 times a day  . ipratropium (ATROVENT) 0.02 % nebulizer solution Take 0.5 mg by nebulization every 6 (six) hours.  Marland Kitchen labetalol (NORMODYNE) 200 MG tablet Take 200 mg by mouth daily. For HTN  . lisinopril (PRINIVIL,ZESTRIL) 10 MG tablet Take 10 mg by mouth daily.  Marland Kitchen loratadine (CLARITIN) 10 MG tablet Take 10 mg by mouth daily.  . methimazole (TAPAZOLE) 5 MG tablet Take 5 mg by mouth daily.  . polyethylene glycol (MIRALAX / GLYCOLAX) packet Take 17 g by mouth at bedtime.  . sennosides-docusate sodium (SENOKOT-S) 8.6-50 MG tablet Take 1 tablet by mouth at bedtime. For constipation  .  umeclidinium bromide (INCRUSE ELLIPTA) 62.5 MCG/INH AEPB Inhale 1 puff into the lungs daily.  Marland Kitchen warfarin (COUMADIN) 1 MG tablet Take  1 mg  tablet by mouth along with 2.5 mg to = 3.5 mg once a day on Sun.,  Mon., Tues., Thurs.,Fri.  .     . warfarin (COUMADIN) 4 MG tablet Take 4 mg tablet by mouth once a day.  . [DISCONTINUED] betamethasone dipropionate (DIPROLENE) 0.05 % cream Apply to legs and hands. Do not apply to face groin, axilla. Try Cerave first twice a day.  . [DISCONTINUED] Emollient (CERAVE) CREA Apply 1 application topically daily as  needed for itching once a day  . [DISCONTINUED] Eyelid Cleansers (SYSTANE LID WIPES EX) Apply 1 application topically 2 (two) times daily. Start occusoft lid scrubs: clean eyelids of oil and debris BID OU ( each eye ) before administering eye drops.   . [DISCONTINUED] warfarin (COUMADIN) 3 MG tablet Take 3 mg tablet by mouth along with 1/2 tablet of (1 mg) to = 3.5 mg once a day on Mon., Wed., Fri.   No facility-administered encounter medications on file as of 12/21/2017.      Review of Systems   In general is not complaining of any fever chills weight appears to be relatively stabilized in the mid 190s.  Skin is not complaining of rashes or itching.  Eyes does have opacity of his left eye at baseline he is not complaining of any visual deficits from baseline.  Ears nose mouth and throat does not complain of sore throat or nasal discharge.  Respiratory is not complaining of shortness of breath or cough-does have a history of COPD.  Cardiac does not complain of chest pain or palpitations does have chronic venous stasis edema.  GI is not complaining of abdominal pain nausea vomiting diarrhea or constipation. History of recurrent sigmoid volvulus but  stable for a considerable amount of time  GU is not complaining of dysuria.  Muscle skeletal does not complain of joint pain.  Neurologic is not complaint dizziness headache or numbness does have right-sided hemiparesis status post CVA.  Psych does not complain of being anxious or depressed.   Past Medical History:  Diagnosis Date  . Dysphasia   . Fatty liver   . Hemiplegia (HCC)   . Hypertension   . Pneumonia   . Pulmonary embolism (HCC)   . 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(HCC)   . Stroke [facility])         Past Surgical History:  Procedure Laterality Date  . FLEXIBLE SIGMOIDOSCOPY N/A 10/21/2016   Procedure: FLEXIBLE SIGMOIDOSCOPY;  Surgeon: Malissa Hippo, MD;  [facility];  Service: Endoscopy;   Laterality: N/A;  . FLEXIBLE SIGMOIDOSCOPY N/A 10/25/2016   Procedure: FLEXIBLE SIGMOIDOSCOPY;  Surgeon: Malissa Hippo, MD;  [facility];  Service: Endoscopy;  Laterality: N/A;  . unable           Allergies  Allergen Reactions  . Penicillins     Has patient had a PCN reaction causing immediate rash, facial/tongue/throat swelling, SOB or lightheadedness with hypotension: unknown Has patient had a PCN reaction causing severe rash involving mucus membranes or skin necrosis: unknown Has patient had a PCN reaction that required hospitalization: unknown Has patient had a PCN reaction occurring within the last 10 years: unknown If all of the above answers are "NO", then may proceed with Cephalosporin use. 5/3 Tolerated Cefepime x 1 dose in ED.         Outpatient Encounter Medications  as of 12/21/2017  Medication Sig  . acetaminophen (TYLENOL) 325 MG tablet Take 650 mg by mouth every 6 (six) hours as needed for moderate pain.   Marland Kitchen atorvastatin (LIPITOR) 10 MG tablet Take 10 mg by mouth daily.  . Bisacodyl (DULCOLAX RE) Place rectally. 1 per rectum ; rectal hemorrhoids at bedtime  . budesonide-formoterol (SYMBICORT) 80-4.5 MCG/ACT inhaler Inhale 2 puffs into the lungs 2 (two) times daily.  . carboxymethylcellulose (REFRESH PLUS) 0.5 % SOLN Place 1 drop into both eyes 3 (three) times daily.   Marland Kitchen erythromycin ophthalmic ointment appy 1 ribbon ophthalmic (eye) 5 times a day  . ipratropium (ATROVENT) 0.02 % nebulizer solution Take 0.5 mg by nebulization every 6 (six) hours.  Marland Kitchen labetalol (NORMODYNE) 200 MG tablet Take 200 mg by mouth daily. For HTN  . lisinopril (PRINIVIL,ZESTRIL) 10 MG tablet Take 10 mg by mouth daily.  Marland Kitchen loratadine (CLARITIN) 10 MG tablet Take 10 mg by mouth daily.  . methimazole (TAPAZOLE) 5 MG tablet Take 5 mg by mouth daily.  . polyethylene glycol (MIRALAX / GLYCOLAX) packet Take 17 g by mouth at bedtime.  . sennosides-docusate sodium (SENOKOT-S) 8.6-50  MG tablet Take 1 tablet by mouth at bedtime. For constipation  . umeclidinium bromide (INCRUSE ELLIPTA) 62.5 MCG/INH AEPB Inhale 1 puff into the lungs daily.  Marland Kitchen warfarin (COUMADIN) 1 MG tablet Take  1 mg  tablet by mouth along with 2.5 mg to = 3.5 mg once a day on Sun.,  Mon., Tues., Thurs.,Fri.  . warfarin (COUMADIN) 2.5 MG tablet Take 2.5 mg along with 1 mg to = 3.5 mg on Mon., Tues., Thurs., Fri.  . warfarin (COUMADIN) 4 MG tablet Take 4 mg tablet by mouth once a day on Wed., Sat.  . [DISCONTINUED] betamethasone dipropionate (DIPROLENE) 0.05 % cream Apply to legs and hands. Do not apply to face groin, axilla. Try Cerave first twice a day.  . [DISCONTINUED] Emollient (CERAVE) CREA Apply 1 application topically daily as needed for itching once a day  . [DISCONTINUED] Eyelid Cleansers (SYSTANE LID WIPES EX) Apply 1 application topically 2 (two) times daily. Start occusoft lid scrubs: clean eyelids of oil and debris BID OU ( each eye ) before administering eye drops.   . [DISCONTINUED] warfarin (COUMADIN) 3 MG tablet Take 3 mg tablet by mouth along with 1/2 tablet of (1 mg) to = 3.5 mg once a day on Mon., Wed., Fri.   No facility-administered encounter medications on file as of 12/21/2017.      Review of Systems   In general is not complaining of any fever chills weight appears to be relatively stabilized in the mid 190s.  Skin is not complaining of rashes or itching.  Eyes does have opacity of his left eye at baseline he is not complaining of any visual deficits from baseline.  Ears nose mouth and throat does not complain of sore throat or nasal discharge.  Respiratory is not complaining of shortness of breath or cough-does have a history of COPD.  Cardiac does not complain of chest pain or palpitations does have chronic venous stasis edema.  GI is not complaining of abdominal pain nausea vomiting diarrhea or constipation. History of recurrent sigmoid volvulus but  stable for a  considerable amount of time  GU is not complaining of dysuria.  Muscle skeletal does not complain of joint pain.  Neurologic is not complaint dizziness headache or numbness does have right-sided hemiparesis status post CVA.  Psych does not complain of being anxious  or depressed.   Past Medical History:  Diagnosis Date  . Dysphasia   . Fatty liver   . Hemiplegia (HCC)   . Hypertension   . Pneumonia   . Pulmonary embolism (HCC)   . 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(HCC)   . Stroke [facility])         Past Surgical History:  Procedure Laterality Date  . FLEXIBLE SIGMOIDOSCOPY N/A 10/21/2016   Procedure: FLEXIBLE SIGMOIDOSCOPY;  Surgeon: Malissa Hippo, MD;  [facility];  Service: Endoscopy;  Laterality: N/A;  . FLEXIBLE SIGMOIDOSCOPY N/A 10/25/2016   Procedure: FLEXIBLE SIGMOIDOSCOPY;  Surgeon: Malissa Hippo, MD;  [facility];  Service: Endoscopy;  Laterality: N/A;  . unable           Allergies  Allergen Reactions  . Penicillins     Has patient had a PCN reaction causing immediate rash, facial/tongue/throat swelling, SOB or lightheadedness with hypotension: unknown Has patient had a PCN reaction causing severe rash involving mucus membranes or skin necrosis: unknown Has patient had a PCN reaction that required hospitalization: unknown Has patient had a PCN reaction occurring within the last 10 years: unknown If all of the above answers are "NO", then may proceed with Cephalosporin use. 5/3 Tolerated Cefepime x 1 dose in ED.          Medication   Medication Sig  . acetaminophen (TYLENOL) 325 MG tablet Take 650 mg by mouth every 6 (six) hours as needed for moderate pain.   Marland Kitchen atorvastatin (LIPITOR) 10 MG tablet Take 10 mg by mouth daily.  . Bisacodyl (DULCOLAX RE) Place rectally. 1 per rectum ; rectal hemorrhoids at bedtime  . budesonide-formoterol (SYMBICORT) 80-4.5 MCG/ACT inhaler Inhale 2 puffs into the lungs 2 (two) times daily.  .  carboxymethylcellulose (REFRESH PLUS) 0.5 % SOLN Place 1 drop into both eyes 3 (three) times daily.   Marland Kitchen erythromycin ophthalmic ointment appy 1 ribbon ophthalmic (eye) 5 times a day  . ipratropium (ATROVENT) 0.02 % nebulizer solution Take 0.5 mg by nebulization every 6 (six) hours.  Marland Kitchen labetalol (NORMODYNE) 200 MG tablet Take 200 mg by mouth daily. For HTN  . lisinopril (PRINIVIL,ZESTRIL) 10 MG tablet Take 10 mg by mouth daily.  Marland Kitchen loratadine (CLARITIN) 10 MG tablet Take 10 mg by mouth daily.  . methimazole (TAPAZOLE) 5 MG tablet Take 5 mg by mouth daily.  . polyethylene glycol (MIRALAX / GLYCOLAX) packet Take 17 g by mouth at bedtime.  . sennosides-docusate sodium (SENOKOT-S) 8.6-50 MG tablet Take 1 tablet by mouth at bedtime. For constipation  . umeclidinium bromide (INCRUSE ELLIPTA) 62.5 MCG/INH AEPB Inhale 1 puff into the lungs daily.  Marland Kitchen warfarin (COUMADIN) 1 MG tablet Take  1 mg  tablet by mouth along with 2.5 mg to = 3.5 mg once a day on Sun.,  Mon., Tues., Thurs.,Fri.  . warfarin (COUMADIN) 2.5 MG tablet Take 2.5 mg along with 1 mg to = 3.5 mg on Mon., Tues., Thurs., Fri.  . warfarin (COUMADIN) 4 MG tablet Take 4 mg tablet by mouth once a day on Wed., Sat.  . [DISCONTINUED] betamethasone dipropionate (DIPROLENE) 0.05 % cream Apply to legs and hands. Do not apply to face groin, axilla. Try Cerave first twice a day.  . [DISCONTINUED] Emollient (CERAVE) CREA Apply 1 application topically daily as needed for itching once a day  . [DISCONTINUED] Eyelid Cleansers (SYSTANE LID WIPES EX) Apply 1 application topically 2 (two) times daily. Start occusoft lid scrubs: clean eyelids of oil and  debris BID OU ( each eye ) before administering eye drops.   . [DISCONTINUED] warfarin (COUMADIN) 3 MG tablet Take 3 mg tablet by mouth along with 1/2 tablet of (1 mg) to = 3.5 mg once a day on Mon., Wed., Fri.   No facility-administered encounter medications on file as of 12/21/2017.    Review of  systems.  General is not complaining of any fever chills.  .  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  Respiratory has a history of COPD but is not complaining of shortness of breath or increased cough from baseline occasionally will cough  Cardiac is not complaining of chest pain  Musculoskeletal does not complain of joint pain continues to ambulate about facility in his wheelchair.  Neurologic does not complain of dizziness or headache does have a history CVA with right-sided hemiparalysis.  Psych does not complain of being depressed or anxious   Physical exam.  Temperature is 98.1 pulse 83 respirations 20 blood pressure 138/85-O2 saturation is 92% on room air  In general this is a pleasant elderly male in no distress and comfortably in bed.  His skin is warm and dry.  Eyes continues with the left eye opacity- visual acuity appears to be at baseline I do not see drainage.  Oropharynx is clear mucous membranes moist.  Chest is clear to auscultation initially had some slight coarse breath sounds which is not unusual but this cleared with cough.  There is no labored breathing.  Heart is regular rate and rhythm without murmur gallop or rub.  Musculoskeletal continues to have right-sided hemiparalysis--does very well with his left upper and lower extremity and is able to ambulate in his wheelchair   neurologic as noted above his speech is somewhat slurred which is chronic.  Psych he is alert and oriented pleasant and appropriate    Labs.  January 09, 2018.  INR 2.32.  Nov 16, 2017.  Sodium 138 potassium 4.1 BUN 20 creatinine 1.07.  WBC 8.2 hemoglobin 15.9 platelets 269   Assessment and plan.  1.  Cough- this does not appear to be increased from baseline- this was discussed with his nurses- occasionally will have a cough but there has not really been any  significant changes  Patient is not complaining of any increased cough or shortness of breath--continues to ambulate about facility in his wheelchair at baseline  At this point continue Incruse Ellipta and Symbicort as well as PRN nebulizers- monitor for any changes  [id number]

## 2018-01-14 ENCOUNTER — Encounter: Payer: Self-pay | Admitting: Internal Medicine

## 2018-01-16 ENCOUNTER — Encounter (HOSPITAL_COMMUNITY)
Admission: RE | Admit: 2018-01-16 | Discharge: 2018-01-16 | Disposition: A | Discharge status: Home / Self Care | Payer: Medicare Other | Source: Skilled Nursing Facility | Attending: Internal Medicine | Admitting: Internal Medicine

## 2018-01-16 DIAGNOSIS — K562 Volvulus: Secondary | ICD-10-CM | POA: Insufficient documentation

## 2018-01-16 DIAGNOSIS — I69328 Other speech and language deficits following cerebral infarction: Secondary | ICD-10-CM | POA: Diagnosis not present

## 2018-01-16 DIAGNOSIS — M6281 Muscle weakness (generalized): Secondary | ICD-10-CM | POA: Insufficient documentation

## 2018-01-16 DIAGNOSIS — I10 Essential (primary) hypertension: Secondary | ICD-10-CM | POA: Insufficient documentation

## 2018-01-16 DIAGNOSIS — R1312 Dysphagia, oropharyngeal phase: Secondary | ICD-10-CM | POA: Diagnosis not present

## 2018-01-16 LAB — PROTIME-INR
INR: 2.63
Prothrombin Time: 27.9 seconds — ABNORMAL HIGH (ref 11.4–15.2)

## 2018-01-17 DIAGNOSIS — I69328 Other speech and language deficits following cerebral infarction: Secondary | ICD-10-CM | POA: Diagnosis not present

## 2018-01-17 DIAGNOSIS — R1312 Dysphagia, oropharyngeal phase: Secondary | ICD-10-CM | POA: Diagnosis not present

## 2018-01-18 DIAGNOSIS — R1312 Dysphagia, oropharyngeal phase: Secondary | ICD-10-CM | POA: Diagnosis not present

## 2018-01-18 DIAGNOSIS — I69328 Other speech and language deficits following cerebral infarction: Secondary | ICD-10-CM | POA: Diagnosis not present

## 2018-01-19 ENCOUNTER — Encounter: Payer: Self-pay | Admitting: Internal Medicine

## 2018-01-19 ENCOUNTER — Non-Acute Institutional Stay (SKILLED_NURSING_FACILITY): Payer: Medicare Other | Admitting: Internal Medicine

## 2018-01-19 DIAGNOSIS — I10 Essential (primary) hypertension: Secondary | ICD-10-CM | POA: Diagnosis not present

## 2018-01-19 DIAGNOSIS — Z7901 Long term (current) use of anticoagulants: Secondary | ICD-10-CM | POA: Diagnosis not present

## 2018-01-19 DIAGNOSIS — I2782 Chronic pulmonary embolism: Secondary | ICD-10-CM

## 2018-01-19 DIAGNOSIS — J410 Simple chronic bronchitis: Secondary | ICD-10-CM | POA: Diagnosis not present

## 2018-01-19 DIAGNOSIS — I639 Cerebral infarction, unspecified: Secondary | ICD-10-CM | POA: Diagnosis not present

## 2018-01-19 DIAGNOSIS — I69328 Other speech and language deficits following cerebral infarction: Secondary | ICD-10-CM | POA: Diagnosis not present

## 2018-01-19 DIAGNOSIS — R1312 Dysphagia, oropharyngeal phase: Secondary | ICD-10-CM | POA: Diagnosis not present

## 2018-01-19 NOTE — Progress Notes (Signed)
Location:   [facility] Room Number: 112/W Place of Service:  SNF [phone]) Provider:  Pollyann Savoy, MD  [name], [name]  Extended Emergency Contact Information Primary Emergency Contact: [name] Address: [address]          [place], [place] [address] D[place] of [place] Home Phone: [phone] Mobile Phone: [phone] Relation: Brother  Code Status:  Full Code Goals of care: Advanced Directive information Advanced Directives 01/19/2018  Does Patient Have a Medical Advance Directive? Yes  Type of Advance Directive (No Data)  Does patient want to make changes to medical advance directive? No - Patient declined  Copy of Healthcare Power of Attorney in Chart? No - copy requested  Would patient like information on creating a medical advance directive? No - Patient declined     Chief Complaint  Patient presents with  . Medical Management of Chronic Issues    Patient is being seen for routine visit of Medical Management    HPI:  Pt is a 72 y.o. male seen today for medical management of chronic diseases.    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 And Left Corneal Abrasion  Patient is doing well in facility.  His weight is stable at 194 pounds He is eating well and has bowels are moving well He does have some dry cough but no shortness of breath or chest pain He follows with ophthalmology for his left eye abrasion and is on chronic azithromycin ointment No nursing issues Past Medical History:  Diagnosis Date  . Dysphasia   . Fatty liver   . Hemiplegia (HCC)   . Hypertension   . Pneumonia   . Pulmonary embolism (HCC)   . 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(HCC)   . Stroke [facility])    Past Surgical History:  Procedure  Laterality Date  . FLEXIBLE SIGMOIDOSCOPY N/A 10/21/2016   Procedure: FLEXIBLE SIGMOIDOSCOPY;  Surgeon: Malissa Hippo, MD;  [facility];  Service: Endoscopy;  Laterality: N/A;  . FLEXIBLE SIGMOIDOSCOPY N/A 10/25/2016   Procedure: FLEXIBLE SIGMOIDOSCOPY;  Surgeon: Malissa Hippo, MD;  Location: [facility];  Service: Endoscopy;  Laterality: N/A;  . unable      Allergies  Allergen Reactions  . Penicillins     Has patient had a PCN reaction causing immediate rash, facial/tongue/throat swelling, SOB or lightheadedness with hypotension: unknown Has patient had a PCN reaction causing severe rash involving mucus membranes or skin necrosis: unknown Has patient had a PCN reaction that required hospitalization: unknown Has patient had a PCN reaction occurring within the last 10 years: unknown If all of the above answers are "NO", then may proceed with Cephalosporin use. 5/3 Tolerated Cefepime x 1 dose in ED.     01/19/2018  Current Outpatient Medications on File Prior to Visit  Medication Sig Dispense Refill  . acetaminophen (TYLENOL) 325 MG tablet Take 650 mg by mouth every 6 (six) hours as needed for moderate pain.     Marland Kitchen atorvastatin (LIPITOR) 10 MG tablet Take 10 mg by mouth daily.    . Bisacodyl (DULCOLAX RE) Place rectally. 1 per rectum ; rectal hemorrhoids at bedtime    . budesonide-formoterol (SYMBICORT) 80-4.5 MCG/ACT inhaler Inhale 2 puffs into the lungs 2 (two) times daily.    . carboxymethylcellulose (REFRESH  PLUS) 0.5 % SOLN Place 1 drop into both eyes 3 (three) times daily.     Marland Kitchen erythromycin ophthalmic ointment appy 1 ribbon ophthalmic (eye) 5 times a day    . ipratropium (ATROVENT) 0.02 % nebulizer solution Take 0.5 mg by nebulization every 6 (six) hours.    Marland Kitchen labetalol (NORMODYNE) 200 MG tablet Take 200 mg by mouth daily. For HTN    . lisinopril (PRINIVIL,ZESTRIL) 10 MG tablet Take 10 mg by mouth daily.    Marland Kitchen loratadine (CLARITIN) 10 MG tablet Take 10 mg by mouth  daily.    . methimazole (TAPAZOLE) 5 MG tablet Take 5 mg by mouth daily.    . polyethylene glycol (MIRALAX / GLYCOLAX) packet Take 17 g by mouth at bedtime.PRN    . sennosides-docusate sodium (SENOKOT-S) 8.6-50 MG tablet Take 1 tablet by mouth at bedtime. For constipation    . umeclidinium bromide (INCRUSE ELLIPTA) 62.5 MCG/INH AEPB Inhale 1 puff into the lungs daily.    Marland Kitchen warfarin (COUMADIN) 4 MG tablet Take 4 mg tablet by mouth once a day     No current facility-administered medications on file prior to visit.       Review of Systems Review of Systems  Constitutional: Negative for activity change, appetite change, chills, diaphoresis, fatigue and fever.  HENT: Negative for mouth sores, postnasal drip, rhinorrhea, sinus pain and sore throat.   Respiratory: Negative for apnea, cough, chest tightness, shortness of breath and wheezing.   Cardiovascular: Negative for chest pain, palpitations and leg swelling.  Gastrointestinal: Negative for abdominal distention, abdominal pain, constipation, diarrhea, nausea and vomiting.  Genitourinary: Negative for dysuria and frequency.  Musculoskeletal: Negative for arthralgias, joint swelling and myalgias.  Skin: Negative for rash.  Neurological: Negative for dizziness, syncope, weakness, light-headedness and numbness.  Psychiatric/Behavioral: Negative for behavioral problems, confusion and sleep disturbance.    Immunization History  Administered Date(s) Administered  . Influenza-Unspecified 03/28/2014, 03/23/2016, 03/25/2017  . Pneumococcal Conjugate-13 04/21/2015  . Pneumococcal-Unspecified 11/01/2009, 03/30/2016  . Tdap 03/17/2017   Pertinent  Health Maintenance Due  Topic Date Due  . INFLUENZA VACCINE  02/19/2018 (Originally 01/19/2018)  . COLONOSCOPY  10/30/2025 (Originally 11/08/1995)  . PNA vac Low Risk Adult  Completed   Fall Risk  04/05/2017 03/17/2017  Falls in the past year? No No  Risk for fall due to : Impaired  balance/gait;Impaired mobility;Medication side effect;Impaired vision;History of fall(s) -   Functional Status Survey:    Vitals:   01/19/18 1202  BP: (!) 148/89  Pulse: 85  Resp: 20  Temp: 97.6 F (36.4 C)  TempSrc: Oral  SpO2: 95%  Weight: 194 lb 6.4 oz (88.2 kg)  Height: 5' 8.5" (1.74 m)   Body mass index is 29.13 kg/m. Physical Exam  Constitutional: He is oriented to person, place, and time. He appears well-developed and well-nourished.  HENT:  Head: Normocephalic.  Mouth/Throat: Oropharynx is clear and moist.  Neck: Neck supple.  Cardiovascular: Normal rate and regular rhythm.  Pulmonary/Chest: Effort normal and breath sounds normal. No stridor. No respiratory distress. He has no wheezes.  Abdominal: Soft. Bowel sounds are normal. He exhibits no distension. There is no tenderness. There is no guarding.  Musculoskeletal:  Chronic Venous stasis Changes bilateral  Lymphadenopathy:    He has no cervical adenopathy.  Neurological: He is alert and oriented to person, place, and time.  Right Sided hemiparesis  Skin: Skin is warm and dry.  Psychiatric: He has a normal mood and affect. His behavior is normal.  Labs reviewed: Recent Labs    07/19/17 0709 09/19/17 0630 11/16/17 0709  NA 139 139 138  K 4.2 3.9 4.1  CL 102 103 102  CO2 24 26 28   GLUCOSE 80 78 82  BUN 20 19 20   CREATININE 1.02 1.11 1.07  CALCIUM 8.9 8.7* 8.7*   Recent Labs    07/19/17 0709  AST 25  ALT 29  ALKPHOS 79  BILITOT 0.7  PROT 7.3  ALBUMIN 3.3*   Recent Labs    03/09/17 0711 07/19/17 0709 11/16/17 0709  WBC 7.7 7.5 8.2  NEUTROABS 4.5 4.1  --   HGB 15.1 15.5 15.9  HCT 46.2 48.6 48.5  MCV 91.5 93.8 95.3  PLT 269 297 269   Lab Results  Component Value Date   TSH 3.657 12/15/2017   Lab Results  Component Value Date   HGBA1C  03/27/2009    6.0 (NOTE) The ADA recommends the following therapeutic goal for glycemic control related to Hgb A1c measurement: Goal of therapy:  <6.5 Hgb A1c  Reference: American Diabetes Association: Clinical Practice Recommendations 2010, Diabetes Care, 2010, 33: (Suppl  1).   Lab Results  Component Value Date   CHOL 111 11/16/2017   HDL 42 11/16/2017   LDLCALC 58 11/16/2017   TRIG 54 11/16/2017   CHOLHDL 2.6 11/16/2017    Significant Diagnostic Results in last 30 days:  No results found.  Assessment/Plan COPD Patient Stable on Incruse Ellipta and Symbicort Has some cough will just watch for now Essential hypertension On Labetolol. Recently his BP mildly elevated Was started on Lisinopril on last visit Will monitor for now Corneal Abrasion On ErythromycinOintment Followswith Opthalmology Recurrent PE and chronic DVT On Coumadin Dose adjusted for his INR Sigmoid volvulusresolved. Continue on Miralax Patient has done well   Subclinical hyperthyroidism Follow Up With Endocrinology On Tapazole  S/P CVA Patient stable.  He is now on Dyphagic Diet and doing well. BP controlled On Statin On Coumadin Hyperlipidemia On Low dose of statin LDL 58 in 05/19     Family/ staff Communication:   Labs/tests ordered:  BMP, CBC  Total time spent in this patient care encounter was 25_ minutes; greater than 50% of the visit spent counseling patient, reviewing records , Labs and coordinating care for problems addressed at this encounter.

## 2018-01-20 DIAGNOSIS — I69328 Other speech and language deficits following cerebral infarction: Secondary | ICD-10-CM | POA: Diagnosis not present

## 2018-01-20 DIAGNOSIS — R1312 Dysphagia, oropharyngeal phase: Secondary | ICD-10-CM | POA: Diagnosis not present

## 2018-01-23 ENCOUNTER — Encounter (HOSPITAL_COMMUNITY)
Admission: RE | Admit: 2018-01-23 | Discharge: 2018-01-23 | Disposition: A | Discharge status: Home / Self Care | Payer: Medicare Other | Source: Skilled Nursing Facility | Attending: Internal Medicine | Admitting: Internal Medicine

## 2018-01-23 DIAGNOSIS — R1312 Dysphagia, oropharyngeal phase: Secondary | ICD-10-CM | POA: Insufficient documentation

## 2018-01-23 DIAGNOSIS — I10 Essential (primary) hypertension: Secondary | ICD-10-CM | POA: Diagnosis not present

## 2018-01-23 DIAGNOSIS — I69328 Other speech and language deficits following cerebral infarction: Secondary | ICD-10-CM | POA: Insufficient documentation

## 2018-01-23 DIAGNOSIS — M6281 Muscle weakness (generalized): Secondary | ICD-10-CM | POA: Diagnosis not present

## 2018-01-23 DIAGNOSIS — K562 Volvulus: Secondary | ICD-10-CM | POA: Insufficient documentation

## 2018-01-23 LAB — CBC
HCT: 44.4 % (ref 39.0–52.0)
Hemoglobin: 14.4 g/dL (ref 13.0–17.0)
MCH: 31.3 pg (ref 26.0–34.0)
MCHC: 32.4 g/dL (ref 30.0–36.0)
MCV: 96.5 fL (ref 78.0–100.0)
Platelets: 281 10*3/uL (ref 150–400)
RBC: 4.6 MIL/uL (ref 4.22–5.81)
RDW: 13.7 % (ref 11.5–15.5)
WBC: 10.3 10*3/uL (ref 4.0–10.5)

## 2018-01-23 LAB — BASIC METABOLIC PANEL
ANION GAP: 7 (ref 5–15)
BUN: 24 mg/dL — ABNORMAL HIGH (ref 8–23)
CALCIUM: 8.3 mg/dL — AB (ref 8.9–10.3)
CO2: 29 mmol/L (ref 22–32)
Chloride: 102 mmol/L (ref 98–111)
Creatinine, Ser: 1.32 mg/dL — ABNORMAL HIGH (ref 0.61–1.24)
GFR calc Af Amer: >60 mL/min (ref >60)
GFR calc non Af Amer: 52 mL/min — ABNORMAL LOW (ref >60)
GLUCOSE: 78 mg/dL (ref 70–99)
POTASSIUM: 4.1 mmol/L (ref 3.5–5.1)
Sodium: 138 mmol/L (ref 135–145)

## 2018-01-23 LAB — PROTIME-INR
INR: 2.45
PROTHROMBIN TIME: 26.4 s — AB (ref 11.4–15.2)

## 2018-02-06 ENCOUNTER — Encounter (HOSPITAL_COMMUNITY)
Admission: RE | Admit: 2018-02-06 | Discharge: 2018-02-06 | Disposition: A | Discharge status: Home / Self Care | Payer: Medicare Other | Source: Skilled Nursing Facility | Attending: *Deleted | Admitting: *Deleted

## 2018-02-06 DIAGNOSIS — K562 Volvulus: Secondary | ICD-10-CM | POA: Diagnosis not present

## 2018-02-06 DIAGNOSIS — I69328 Other speech and language deficits following cerebral infarction: Secondary | ICD-10-CM | POA: Diagnosis not present

## 2018-02-06 DIAGNOSIS — R1312 Dysphagia, oropharyngeal phase: Secondary | ICD-10-CM | POA: Diagnosis not present

## 2018-02-06 DIAGNOSIS — M6281 Muscle weakness (generalized): Secondary | ICD-10-CM | POA: Diagnosis not present

## 2018-02-06 DIAGNOSIS — I10 Essential (primary) hypertension: Secondary | ICD-10-CM | POA: Diagnosis not present

## 2018-02-06 LAB — PROTIME-INR
INR: 2.28
PROTHROMBIN TIME: 25 s — AB (ref 11.4–15.2)

## 2018-02-13 ENCOUNTER — Encounter (HOSPITAL_COMMUNITY)
Admission: RE | Admit: 2018-02-13 | Discharge: 2018-02-13 | Disposition: A | Discharge status: Home / Self Care | Payer: Medicare Other | Source: Skilled Nursing Facility | Attending: *Deleted | Admitting: *Deleted

## 2018-02-13 DIAGNOSIS — R1312 Dysphagia, oropharyngeal phase: Secondary | ICD-10-CM | POA: Diagnosis not present

## 2018-02-13 DIAGNOSIS — K562 Volvulus: Secondary | ICD-10-CM | POA: Diagnosis not present

## 2018-02-13 DIAGNOSIS — I69328 Other speech and language deficits following cerebral infarction: Secondary | ICD-10-CM | POA: Diagnosis not present

## 2018-02-13 DIAGNOSIS — M6281 Muscle weakness (generalized): Secondary | ICD-10-CM | POA: Diagnosis not present

## 2018-02-13 DIAGNOSIS — I10 Essential (primary) hypertension: Secondary | ICD-10-CM | POA: Diagnosis not present

## 2018-02-13 LAB — PROTIME-INR
INR: 2.58
PROTHROMBIN TIME: 27.4 s — AB (ref 11.4–15.2)

## 2018-02-15 ENCOUNTER — Non-Acute Institutional Stay (SKILLED_NURSING_FACILITY): Payer: Medicare Other | Admitting: Internal Medicine

## 2018-02-15 ENCOUNTER — Encounter: Payer: Self-pay | Admitting: Internal Medicine

## 2018-02-15 DIAGNOSIS — S0502XS Injury of conjunctiva and corneal abrasion without foreign body, left eye, sequela: Secondary | ICD-10-CM | POA: Diagnosis not present

## 2018-02-15 DIAGNOSIS — I10 Essential (primary) hypertension: Secondary | ICD-10-CM | POA: Diagnosis not present

## 2018-02-15 DIAGNOSIS — I639 Cerebral infarction, unspecified: Secondary | ICD-10-CM | POA: Diagnosis not present

## 2018-02-15 DIAGNOSIS — E0590 Thyrotoxicosis, unspecified without thyrotoxic crisis or storm: Secondary | ICD-10-CM | POA: Diagnosis not present

## 2018-02-15 DIAGNOSIS — K562 Volvulus: Secondary | ICD-10-CM | POA: Diagnosis not present

## 2018-02-15 DIAGNOSIS — J410 Simple chronic bronchitis: Secondary | ICD-10-CM | POA: Diagnosis not present

## 2018-02-15 NOTE — Progress Notes (Signed)
Location:    [facility] Room Number: 112/W Place of Service:  SNF [phone]) Provider: Edmon Crape PA-C  Mahlon Gammon, MD  [name], [name]  Extended Emergency Contact Information Primary Emergency Contact: [name] Address: [address]          [place], [place] [address] [place] of [place] Home Phone: [phone] Mobile Phone: [phone] Relation: Brother  Code Status:  Full Code Goals of care: Advanced Directive information Advanced Directives 02/15/2018  Does Patient Have a Medical Advance Directive? Yes  Type of Advance Directive (No Data)  Does patient want to make changes to medical advance directive? No - Patient declined  Copy of Healthcare Power of Attorney in Chart? No - copy requested  Would patient like information on creating a medical advance directive? No - Patient declined     Chief Complaint  Patient presents with  . Medical Management of Chronic Issues    Patient is being seen for routine visit of medical management  Chronic medical conditions include COPD-history CVA with right-sided hemiparalysis previous history of pulmonary embolism and DVT on chronic Coumadin- as well as hypertension recurrent sigmoid volvulus and subclinical hyperthyroidism followed by endocrinology- he also has a history of a left corneal abrasion followed by ophthalmology.    HPI:  Pt is a 72 y.o. male seen today for medical management of chronic diseases.  As noted above.  He continues to be quite stable- nursing does not report any concerns it does appear per review of blood pressures at times he does have some elevated systolics in the 140-150s range- when I took it today I got 132/82 appears to have some variability.  He is on labetalol 200 mg a day and lisinopril 10 mg a day was recently added.  At one point he had a recurrent sigmoid volvulus which actually required hospitalization and  he did lose some weight.  However this has largely resolved he is on MiraLAX and Senna with good results- he is gaining back his weight most recent weight was 200 pounds-he has a very good appetite.  He remains on chronic Coumadin for a history of CVA with right-sided hemiparesis as well as previous history of pulmonary embolism and DVT- INR has been therapeutic was 2.58 yesterday update INR is pending.  He also has serious COPD with some chronic congestion- he was started on Incruse Ellipta he is also on Symbicort and appears to be doing well with this lung exam was fairly benign today with minimal congestion noted.  He also has subclinical hyperthyroidism he is on Tapazole and followed by Dr. Fransico Him of endocrinology- TSH in June was within normal range.  He also has a history of a left corneal abrasion with a history of left eye opacity- he is on chronic erythromycin eyedrops and is followed by ophthalmology.  I also note on metabolic panel about 3 weeks ago is creatinine was up a little bit from his baseline at 1.3 to the averages around a creatinine of 1-will update this.  Currently has no complaints other than some occasional right foot pain I did look at his foot and did not really note any abnormalities but this will have to be watched- nursing staff feels at times his foot gets somewhat tired when he ambulates about facility in his wheelchair-which he does do a significant part of the day-   Past Medical History:  Diagnosis Date  . Dysphasia   .  Fatty liver   . Hemiplegia (HCC)   . Hypertension   . Pneumonia   . Pulmonary embolism (HCC)   . 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(HCC)   . Stroke [facility])    Past Surgical History:  Procedure Laterality Date  . FLEXIBLE SIGMOIDOSCOPY N/A 10/21/2016   Procedure: FLEXIBLE SIGMOIDOSCOPY;  Surgeon: Malissa Hippo, MD;  Location: [facility];  Service: Endoscopy;  Laterality: N/A;  . FLEXIBLE SIGMOIDOSCOPY N/A 10/25/2016   Procedure: FLEXIBLE SIGMOIDOSCOPY;   Surgeon: Malissa Hippo, MD;  [facility]: [facility];  Service: Endoscopy;  Laterality: N/A;  . unable      Allergies  Allergen Reactions  . Penicillins     Has patient had a PCN reaction causing immediate rash, facial/tongue/throat swelling, SOB or lightheadedness with hypotension: unknown Has patient had a PCN reaction causing severe rash involving mucus membranes or skin necrosis: unknown Has patient had a PCN reaction that required hospitalization: unknown Has patient had a PCN reaction occurring within the last 10 years: unknown If all of the above answers are "NO", then may proceed with Cephalosporin use. 5/3 Tolerated Cefepime x 1 dose in ED.     Outpatient Encounter Medications as of 02/15/2018  Medication Sig  . acetaminophen (TYLENOL) 325 MG tablet Take 650 mg by mouth every 6 (six) hours as needed for moderate pain.   Marland Kitchen atorvastatin (LIPITOR) 10 MG tablet Take 10 mg by mouth daily.  . budesonide-formoterol (SYMBICORT) 80-4.5 MCG/ACT inhaler Inhale 2 puffs into the lungs 2 (two) times daily.  . carboxymethylcellulose (REFRESH PLUS) 0.5 % SOLN Place 1 drop into both eyes 3 (three) times daily.   Marland Kitchen erythromycin ophthalmic ointment appy 1 ribbon ophthalmic (eye) 5 times a day  . ipratropium (ATROVENT) 0.02 % nebulizer solution Take 0.5 mg by nebulization every 6 (six) hours.  Marland Kitchen labetalol (NORMODYNE) 200 MG tablet Take 200 mg by mouth daily. For HTN  . lisinopril (PRINIVIL,ZESTRIL) 10 MG tablet Take 10 mg by mouth daily.  Marland Kitchen loratadine (CLARITIN) 10 MG tablet Take 10 mg by mouth daily.  . methimazole (TAPAZOLE) 5 MG tablet Take 5 mg by mouth daily.  . polyethylene glycol (MIRALAX / GLYCOLAX) packet Take 17 g by mouth at bedtime.  . sennosides-docusate sodium (SENOKOT-S) 8.6-50 MG tablet Take 1 tablet by mouth at bedtime. For constipation  . shark liver oil-cocoa butter (HEMORRHOIDAL) 0.25-88.44 % suppository Place 1 suppository rectally at bedtime as needed for hemorrhoids.  Marland Kitchen  umeclidinium bromide (INCRUSE ELLIPTA) 62.5 MCG/INH AEPB Inhale 1 puff into the lungs daily.  Marland Kitchen warfarin (COUMADIN) 4 MG tablet Take 4 mg tablet by mouth once a day  . [DISCONTINUED] Bisacodyl (DULCOLAX RE) Place rectally. 1 per rectum ; rectal hemorrhoids at bedtime   No facility-administered encounter medications on file as of 02/15/2018.      Review of Systems   General is not complaining any fever chills appears to have gained about 5 pounds over the past month again his appetite has really improved.  Skin is not complain of rashes or itching.  Head ears eyes nose mouth and throat does have a history of left eye opacity corneal abrasion is followed by ophthalmology and has topical eyedrops.  Does not complain of a sore throat.  Respiratory is not complain of shortness of breath or cough does have a history of COPD with some chronic congestion.  Cardiac is not complaining of chest pain has quite mild lower extremity edema I would say trace.  GI does not complain of abdominal  discomfort nausea vomiting diarrhea constipation sigmoid volvulus appears largely resolved.  GU is not complaining of dysuria.  Musculoskeletal does have right-sided hemiparalysis does complain of occasional right foot pain as noted above.  Neurologic does not complain of dizziness headache numbness does have a history of CVA with right-sided weakness.  Psych does not complain of being depressed or anxious continues to be motivated ambulates about facility in a wheelchair using his left-sided extremities.    Immunization History  Administered Date(s) Administered  . Influenza-Unspecified 03/28/2014, 03/23/2016, 03/25/2017  . Pneumococcal Conjugate-13 04/21/2015  . Pneumococcal-Unspecified 11/01/2009, 03/30/2016  . Tdap 03/17/2017   Pertinent  Health Maintenance Due  Topic Date Due  . INFLUENZA VACCINE  02/19/2018 (Originally 01/19/2018)  . COLONOSCOPY  10/30/2025 (Originally 11/08/1995)  . PNA vac Low  Risk Adult  Completed   Fall Risk  04/05/2017 03/17/2017  Falls in the past year? No No  Risk for fall due to : Impaired balance/gait;Impaired mobility;Medication side effect;Impaired vision;History of fall(s) -   Functional Status Survey:    Vitals:   02/15/18 1445  BP: (!) 145/89  Pulse: 77  Resp: 19  Temp: 98.7 F (37.1 C)  TempSrc: Oral  SpO2: 94%  Weight: 200 lb 6.4 oz (90.9 kg)  Height: 5\' 8"  (1.727 m)  Manual blood pressure today was 132/82 Body mass index is 30.47 kg/m. Physical Exam   In general this is a pleasant elderly male in no distress sitting comfortably in his wheelchair.  His skin is warm and dry.  Eyes right eye visual acuity appears grossly intact he does have opacity of the left eye.  Oropharynx is clear mucous membranes moist.  Chest is clear to auscultation today there is no labored breathing somewhat reduced air entry.  Heart is regular rate and rhythm without murmur gallop or rub he has chronic mild venous stasis edema lower extremities.  Abdomen is soft nontender with positive bowel sounds.  Musculoskeletal does have right-sided hemiparesis moves upper and lower left extremities at baseline- he does continue with contracture of his right hand.  I could not really appreciate any acute tenderness palpation of his right foot or significant edema or warmth- did not appreciate any open areas he does have a partial toenail on the right great toe but I do not see any sign of infection or drainage bleeding- Could not really appreciate deformities  Neurologic as noted above-he does have somewhat slurred speech.  Psych he is alert and oriented pleasant and appropriate  Labs reviewed: Recent Labs    09/19/17 0630 11/16/17 0709 01/23/18 0315  NA 139 138 138  K 3.9 4.1 4.1  CL 103 102 102  CO2 26 28 29   GLUCOSE 78 82 78  BUN 19 20 24*  CREATININE 1.11 1.07 1.32*  CALCIUM 8.7* 8.7* 8.3*   Recent Labs    07/19/17 0709  AST 25  ALT 29    ALKPHOS 79  BILITOT 0.7  PROT 7.3  ALBUMIN 3.3*   Recent Labs    03/09/17 0711 07/19/17 0709 11/16/17 0709 01/23/18 0315  WBC 7.7 7.5 8.2 10.3  NEUTROABS 4.5 4.1  --   --   HGB 15.1 15.5 15.9 14.4  HCT 46.2 48.6 48.5 44.4  MCV 91.5 93.8 95.3 96.5  PLT 269 297 269 281   Lab Results  Component Value Date   TSH 3.657 12/15/2017   Lab Results  Component Value Date   HGBA1C  03/27/2009    6.0 (NOTE) The ADA recommends the following  therapeutic goal for glycemic control related to Hgb A1c measurement: Goal of therapy: <6.5 Hgb A1c  Reference: American Diabetes Association: Clinical Practice Recommendations 2010, Diabetes Care, 2010, 33: (Suppl  1).   Lab Results  Component Value Date   CHOL 111 11/16/2017   HDL 42 11/16/2017   LDLCALC 58 11/16/2017   TRIG 54 11/16/2017   CHOLHDL 2.6 11/16/2017    Significant Diagnostic Results in last 30 days:  No results found.  Assessment/Plan  #1-history of COPD this appears to be stable on Incruse Ellipta as well as Symbicort he does have duo nebs if needed- exam was fairly benign today he does not complain of shortness of breath continues to ambulate about facility at baseline without complaints of shortness of breath.  2.  History of CVA with right-sided hemiparalysis he continues on Coumadin INR is therapeutic update INR is pending he is on a statin LDL was 58 on May 2019 lab- he continues on a dysphagia diet.  3.  History of pulmonary embolism and DVT in the past  is on Coumadin-INR is pending.  4.  Hypertension-he is on lisinopril 10 mg a day and labetalol 200 mg a day- will order manual blood pressure checks here couple times a day to see if his elevations are consistent again we got variable readings today- consider increasing lisinopril if somewhat consistently elevated.  5.-History of subclinical hyperthyroidism this is followed by endocrinology he is on Tapazole TSH was normal back in June.  6.-History of left corneal  abrasion this is followed by ophthalmology he is on erythromycin eyedrops does not complain of discomfort at this time he continued with a left eye opacity.  7.  History of sigmoid volvulus this again has stabilized on MiraLAX and Senna--has had regular bowel movements per nursing he is not complaining of any abdominal discomfort or distention-.  8.  History of allergic rhinitis this is chronic and he continues on Claritin.  9.  History of mild renal insufficiency-as noted above creatinine of 1.32 was slightly above his baseline will have this updated especially since he is on an ACE inhibitor.  [id number]

## 2018-02-16 ENCOUNTER — Encounter (HOSPITAL_COMMUNITY)
Admission: RE | Admit: 2018-02-16 | Discharge: 2018-02-16 | Disposition: A | Discharge status: Home / Self Care | Payer: Medicare Other | Source: Skilled Nursing Facility | Attending: Internal Medicine | Admitting: Internal Medicine

## 2018-02-16 DIAGNOSIS — K562 Volvulus: Secondary | ICD-10-CM | POA: Insufficient documentation

## 2018-02-16 DIAGNOSIS — M6281 Muscle weakness (generalized): Secondary | ICD-10-CM | POA: Insufficient documentation

## 2018-02-16 DIAGNOSIS — I69328 Other speech and language deficits following cerebral infarction: Secondary | ICD-10-CM | POA: Insufficient documentation

## 2018-02-16 DIAGNOSIS — R1312 Dysphagia, oropharyngeal phase: Secondary | ICD-10-CM | POA: Diagnosis not present

## 2018-02-16 DIAGNOSIS — I10 Essential (primary) hypertension: Secondary | ICD-10-CM | POA: Insufficient documentation

## 2018-02-16 LAB — BASIC METABOLIC PANEL
Anion gap: 9 (ref 5–15)
BUN: 21 mg/dL (ref 8–23)
CALCIUM: 8.7 mg/dL — AB (ref 8.9–10.3)
CO2: 27 mmol/L (ref 22–32)
Chloride: 102 mmol/L (ref 98–111)
Creatinine, Ser: 1.19 mg/dL (ref 0.61–1.24)
GFR, EST NON AFRICAN AMERICAN: 59 mL/min — AB (ref >60)
Glucose, Bld: 77 mg/dL (ref 70–99)
POTASSIUM: 4.1 mmol/L (ref 3.5–5.1)
Sodium: 138 mmol/L (ref 135–145)

## 2018-02-28 ENCOUNTER — Encounter (HOSPITAL_COMMUNITY)
Admission: RE | Admit: 2018-02-28 | Discharge: 2018-02-28 | Disposition: A | Discharge status: Home / Self Care | Payer: Medicare Other | Source: Skilled Nursing Facility | Attending: Internal Medicine | Admitting: Internal Medicine

## 2018-02-28 DIAGNOSIS — K562 Volvulus: Secondary | ICD-10-CM | POA: Insufficient documentation

## 2018-02-28 DIAGNOSIS — R1312 Dysphagia, oropharyngeal phase: Secondary | ICD-10-CM | POA: Diagnosis not present

## 2018-02-28 DIAGNOSIS — I69328 Other speech and language deficits following cerebral infarction: Secondary | ICD-10-CM | POA: Insufficient documentation

## 2018-02-28 DIAGNOSIS — Z7901 Long term (current) use of anticoagulants: Secondary | ICD-10-CM | POA: Insufficient documentation

## 2018-02-28 DIAGNOSIS — R293 Abnormal posture: Secondary | ICD-10-CM | POA: Insufficient documentation

## 2018-02-28 DIAGNOSIS — I10 Essential (primary) hypertension: Secondary | ICD-10-CM | POA: Insufficient documentation

## 2018-02-28 LAB — PROTIME-INR
INR: 1.5
Prothrombin Time: 18 seconds — ABNORMAL HIGH (ref 11.4–15.2)

## 2018-03-07 ENCOUNTER — Encounter (HOSPITAL_COMMUNITY)
Admission: RE | Admit: 2018-03-07 | Discharge: 2018-03-07 | Disposition: A | Discharge status: Home / Self Care | Payer: Medicare Other | Source: Skilled Nursing Facility | Attending: *Deleted | Admitting: *Deleted

## 2018-03-07 LAB — PROTIME-INR
INR: 1.73
Prothrombin Time: 20.1 seconds — ABNORMAL HIGH (ref 11.4–15.2)

## 2018-03-10 ENCOUNTER — Encounter: Payer: Self-pay | Admitting: Internal Medicine

## 2018-03-10 ENCOUNTER — Non-Acute Institutional Stay (SKILLED_NURSING_FACILITY): Payer: Medicare Other | Admitting: Internal Medicine

## 2018-03-10 DIAGNOSIS — I10 Essential (primary) hypertension: Secondary | ICD-10-CM | POA: Diagnosis not present

## 2018-03-10 DIAGNOSIS — I2782 Chronic pulmonary embolism: Secondary | ICD-10-CM

## 2018-03-10 DIAGNOSIS — I639 Cerebral infarction, unspecified: Secondary | ICD-10-CM

## 2018-03-10 NOTE — Progress Notes (Signed)
Location:    [facility] Room Number: 112/W Place of Service:  SNF [phone] Provider:  Sabino DickArlo Terricka Onofrio  Gupta, Anjali L, MD  Patient Care Team: Mahlon GammonGupta, Anjali L, [name]  Extended Emergency Contact Information Primary Emergency Contact: Venetia MaxonWalker,[name] Jr Address: [address]          [place], [place] [address] [place] Home Phone: [phone] Mobile Phone: [phone] Relation: Brother  Code Status:  Full Code Goals of care: Advanced Directive information Advanced Directives 02/15/2018  Does Patient Have a Medical Advance Directive? Yes  Type of Advance Directive (No Data)  Does patient want to make changes to medical advance directive? No - Patient declined  Copy of Healthcare Power of Attorney in Chart? No - copy requested  Would patient like information on creating a medical advance directive? No - Patient declined     Chief Complaint  Patient presents with  . Acute Visit    Patient si being seen forelevated BP    HPI:  Pt is a 72 y.o. male seen today for an acute visit for an elevated blood pressure.  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  Patient had times has variable blood pressures he is on labetalol 200 mg a day as well as lisinopril 10 mg a day.  Recent blood pressures have ranged from the 130s to 150 systolically although occasionally now lately obscene some 160 systolics in the 170s today per nursing.  Diastolic is elevated today in the low 100s looks like previous readings have been in the 80s to low 90s.  He is not complaining of any headache or dizziness or  syncope continues to ambulate about the hallway in his wheelchair-despite his hemiparalysis on the right he continues to be motivated and ambulates using his right-sided extremities.  He does not complain of any headache dizziness syncope-again appears to be at his baseline  Of note he is on Coumadin with a history of DVT PE and CVA INR was slightly subtherapeutic adjustments were made update INR is pending   Past Medical History:  Diagnosis Date  . Dysphasia   . Fatty liver   . Hemiplegia (HCC)   . Hypertension   . Pneumonia   . Pulmonary embolism (HCC)   . 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(HCC)   . Stroke [facility](HCC)    Past Surgical History:  Procedure Laterality Date  . FLEXIBLE SIGMOIDOSCOPY N/A 10/21/2016   Procedure: FLEXIBLE SIGMOIDOSCOPY;  Surgeon: Malissa Hippoehman, Najeeb U, MD;  [facility];  Service: Endoscopy;  Laterality: N/A;  . FLEXIBLE SIGMOIDOSCOPY N/A 10/25/2016   Procedure: FLEXIBLE SIGMOIDOSCOPY;  Surgeon: Malissa Hippoehman, Najeeb U, MD;  Location: [facility];  Service: Endoscopy;  Laterality: N/A;  . unable      Allergies  Allergen Reactions  . Penicillins     Has patient had a PCN reaction causing immediate rash, facial/tongue/throat swelling, SOB or lightheadedness with hypotension: unknown Has patient had a PCN reaction causing severe rash involving mucus membranes or skin necrosis: unknown Has patient had a PCN reaction that required hospitalization: unknown Has patient had a PCN reaction occurring within the last 10 years: unknown If all of the  above answers are "NO", then may proceed with Cephalosporin use. 5/3 Tolerated Cefepime x 1 dose in ED.     Outpatient Encounter Medications as of 03/10/2018  Medication Sig  . acetaminophen (TYLENOL) 325 MG tablet Take 650 mg by mouth every 6 (six) hours as needed for moderate pain.   Marland Kitchen atorvastatin (LIPITOR) 10 MG tablet Take 10 mg by mouth daily.  . budesonide-formoterol (SYMBICORT) 80-4.5 MCG/ACT inhaler Inhale 2 puffs into the lungs  2 (two) times daily.  . carboxymethylcellulose (REFRESH PLUS) 0.5 % SOLN Place 1 drop into both eyes 3 (three) times daily.   Marland Kitchen erythromycin ophthalmic ointment appy 1 ribbon ophthalmic (eye) 5 times a day  . ipratropium (ATROVENT) 0.02 % nebulizer solution Take 0.5 mg by nebulization every 6 (six) hours.  Marland Kitchen labetalol (NORMODYNE) 200 MG tablet Take 200 mg by mouth daily. For HTN  . lisinopril (PRINIVIL,ZESTRIL) 10 MG tablet Take 10 mg by mouth daily.  Marland Kitchen loratadine (CLARITIN) 10 MG tablet Take 10 mg by mouth daily.  . methimazole (TAPAZOLE) 5 MG tablet Take 5 mg by mouth daily.  . polyethylene glycol (MIRALAX / GLYCOLAX) packet Take 17 g by mouth at bedtime.  . sennosides-docusate sodium (SENOKOT-S) 8.6-50 MG tablet Take 1 tablet by mouth at bedtime. For constipation  . shark liver oil-cocoa butter (HEMORRHOIDAL) 0.25-88.44 % suppository Place 1 suppository rectally at bedtime as needed for hemorrhoids.  Marland Kitchen umeclidinium bromide (INCRUSE ELLIPTA) 62.5 MCG/INH AEPB Inhale 1 puff into the lungs daily.  Marland Kitchen warfarin (COUMADIN) 4 MG tablet Take 4 mg tablet by mouth once a day   No facility-administered encounter medications on file as of 03/10/2018.     Review of Systems   In general is not complaining any fever or chills.  Skin is not complaining of rashes or itching not complain of diaphoresis.  Head ears eyes nose mouth and throat is not complaining of visual changes sore throat does of opacity of left eye and a corneal abrasion that is followed by ophthalmology and continues on topical eyedrops.  Not complaining of any sore throat or difficulty swallowing.  Respiratory does not complain of shortness of breath or cough has some chronic rhonchi but this appears unchanged.  Cardiac is not complaining of chest pain has baseline lower extremity edema is mild.  GI does not complain of any abdominal pain nausea vomiting diarrhea constipation has regular bowel movements per nursing does have a  history of sigmoid volvulus.  GU does not complain of dysuria.  Musculoskeletal has right-sided hemiparesis at baseline does not complain of any increased weakness on the left side.  Neurologic is not complaining of any dizziness headache syncope or focal weaknesses that are different from baseline with history of right-sided hemiparalysis.  Psych does not complain of being depressed or anxious appears to be in good spirits continues to ambulate about the hallway in his wheelchair  Immunization History  Administered Date(s) Administered  . Influenza-Unspecified 03/28/2014, 03/23/2016, 03/25/2017  . Pneumococcal Conjugate-13 04/21/2015  . Pneumococcal-Unspecified 11/01/2009, 03/30/2016  . Tdap 03/17/2017   Pertinent  Health Maintenance Due  Topic Date Due  . INFLUENZA VACCINE  04/09/2018 (Originally 01/19/2018)  . COLONOSCOPY  10/30/2025 (Originally 11/08/1995)  . PNA vac Low Risk Adult  Completed   Fall Risk  04/05/2017 03/17/2017  Falls in the past year? No No  Risk for fall due to : Impaired balance/gait;Impaired mobility;Medication side effect;Impaired vision;History of fall(s) -   Functional Status Survey:    Vitals:   03/10/18 1015  BP: (!) 178/102  Pulse: 93  Resp: 16  Temp: (!) 97.1 F (36.2 C)  TempSrc: Oral  SpO2: 93%  --Manual blood pressure is 170/104 Physical Exam   In general--- this is a pleasant elderly male in no distress sitting comfortably in his wheelchair at baseline.  His skin is warm and dry.  Eyes visual acuity appears to be intact on the right does have opacity of his left eye.  Oropharynx is clear mucous membranes moist tongue is midline with full range of motion.  Chest he does have some slight rhonchi on expiration this is relatively baseline there is no labored breathing.  Heart is regular rate and rhythm without murmur gallop or rub he has mild chronic venous stasis edema of his lower extremities bilaterally.  His abdomen somewhat obese  soft nontender with positive bowel sounds.  Musculoskeletal continues with right-sided hemiparesis moves his left upper and lower extremities at baseline ambulates well in his wheelchair using these extremities.  Neurologic as noted above could not really appreciate any focal deficits different from baseline speech is slurred which is baseline.  Psych he is alert and oriented pleasant and appropriate.      Labs reviewed: Recent Labs    11/16/17 0709 01/23/18 0315 02/16/18 0739  NA 138 138 138  K 4.1 4.1 4.1  CL 102 102 102  CO2 28 29 27   GLUCOSE 82 78 77  BUN 20 24* 21  CREATININE 1.07 1.32* 1.19  CALCIUM 8.7* 8.3* 8.7*   Recent Labs    07/19/17 0709  AST 25  ALT 29  ALKPHOS 79  BILITOT 0.7  PROT 7.3  ALBUMIN 3.3*   Recent Labs    07/19/17 0709 11/16/17 0709 01/23/18 0315  WBC 7.5 8.2 10.3  NEUTROABS 4.1  --   --   HGB 15.5 15.9 14.4  HCT 48.6 48.5 44.4  MCV 93.8 95.3 96.5  PLT 297 269 281   Lab Results  Component Value Date   TSH 3.657 12/15/2017   Lab Results  Component Value Date   HGBA1C  03/27/2009    6.0 (NOTE) The ADA recommends the following therapeutic goal for glycemic control related to Hgb A1c measurement: Goal of therapy: <6.5 Hgb A1c  Reference: American Diabetes Association: Clinical Practice Recommendations 2010, Diabetes Care, 2010, 33: (Suppl  1).   Lab Results  Component Value Date   CHOL 111 11/16/2017   HDL 42 11/16/2017   LDLCALC 58 11/16/2017   TRIG 54 11/16/2017   CHOLHDL 2.6 11/16/2017    Significant Diagnostic Results in last 30 days:  No results found.  Assessment/Plan  #1-hypertension-he is on lisinopril 10 mg a day labetalol 200 mg a day-he appears he has some consistency of elevations here will increase his lisinopril to 20 mg a day and continue the labetalol at current dose- secondary to diastolic over 100 we will give him a low dose of clonidine 0.11 dose- monitor blood pressures throughout today to make sure  it comes down somewhat.  Also monitor blood pressure and pulse every shift for now.  2.  History of CVA he appears to be at baseline with his right-sided hemiparalysis could not really appreciate any findings different from baseline-he continues on a dysphagia diet also is on a statin LDL was 58 on May lab.  3.  History of pulmonary embolism DVT in the past INR most recently was 1.73 adjustments were made in his Coumadin and update INR is pending next week.  [id number]

## 2018-03-14 ENCOUNTER — Encounter (HOSPITAL_COMMUNITY)
Admission: RE | Admit: 2018-03-14 | Discharge: 2018-03-14 | Disposition: A | Discharge status: Home / Self Care | Payer: Medicare Other | Source: Skilled Nursing Facility | Attending: Internal Medicine | Admitting: Internal Medicine

## 2018-03-14 DIAGNOSIS — I69328 Other speech and language deficits following cerebral infarction: Secondary | ICD-10-CM | POA: Insufficient documentation

## 2018-03-14 DIAGNOSIS — M6281 Muscle weakness (generalized): Secondary | ICD-10-CM | POA: Insufficient documentation

## 2018-03-14 DIAGNOSIS — K562 Volvulus: Secondary | ICD-10-CM | POA: Diagnosis not present

## 2018-03-14 DIAGNOSIS — R1312 Dysphagia, oropharyngeal phase: Secondary | ICD-10-CM | POA: Insufficient documentation

## 2018-03-14 DIAGNOSIS — I10 Essential (primary) hypertension: Secondary | ICD-10-CM | POA: Insufficient documentation

## 2018-03-14 LAB — PROTIME-INR
INR: 1.69
Prothrombin Time: 19.7 seconds — ABNORMAL HIGH (ref 11.4–15.2)

## 2018-03-21 ENCOUNTER — Encounter (HOSPITAL_COMMUNITY)
Admission: RE | Admit: 2018-03-21 | Discharge: 2018-03-21 | Disposition: A | Discharge status: Home / Self Care | Payer: Medicare Other | Source: Skilled Nursing Facility | Attending: Internal Medicine | Admitting: Internal Medicine

## 2018-03-21 DIAGNOSIS — R1312 Dysphagia, oropharyngeal phase: Secondary | ICD-10-CM | POA: Insufficient documentation

## 2018-03-21 DIAGNOSIS — I10 Essential (primary) hypertension: Secondary | ICD-10-CM | POA: Insufficient documentation

## 2018-03-21 DIAGNOSIS — I69328 Other speech and language deficits following cerebral infarction: Secondary | ICD-10-CM | POA: Insufficient documentation

## 2018-03-21 DIAGNOSIS — K562 Volvulus: Secondary | ICD-10-CM | POA: Diagnosis not present

## 2018-03-21 DIAGNOSIS — M6281 Muscle weakness (generalized): Secondary | ICD-10-CM | POA: Diagnosis not present

## 2018-03-21 LAB — PROTIME-INR
INR: 1.98
PROTHROMBIN TIME: 22.4 s — AB (ref 11.4–15.2)

## 2018-03-27 ENCOUNTER — Non-Acute Institutional Stay (SKILLED_NURSING_FACILITY): Payer: Medicare Other

## 2018-03-27 DIAGNOSIS — Z0000 Encounter for general adult medical examination without abnormal findings: Secondary | ICD-10-CM

## 2018-03-27 NOTE — Progress Notes (Signed)
Subjective:   [name] [name] is a 72 y.o. male who presents for Medicare Annual/Subsequent preventive examination at [facility]  Last AWV-03/17/2017    Objective:    Vitals: BP 138/80 (BP Location: Left Arm, Patient Position: Sitting)   Pulse 78   Temp 98.6 F (37 C) (Oral)   Ht 5\' 8"  (1.727 m)   Wt 200 lb (90.7 kg)   BMI 30.41 kg/m   Body mass index is 30.41 kg/m.  Advanced Directives 03/27/2018 02/15/2018 01/19/2018 12/21/2017 11/03/2017 10/11/2017 09/12/2017  Does Patient Have a Medical Advance Directive? Yes Yes Yes Yes Yes Yes Yes  Type of Advance Directive (No Data) (No Data) (No Data) (No Data) (No Data) (No Data) (No Data)  Does patient want to make changes to medical advance directive? No - Patient declined No - Patient declined No - Patient declined No - Patient declined No - Patient declined No - Patient declined No - Patient declined  Copy of Healthcare Power of Attorney in Chart? No - copy requested No - copy requested No - copy requested No - copy requested No - copy requested No - copy requested No - copy requested  Would patient like information on creating a medical advance directive? No - Patient declined No - Patient declined No - Patient declined No - Patient declined No - Patient declined No - Patient declined No - Patient declined    Tobacco Social History   Tobacco Use  Smoking Status Never Smoker  Smokeless Tobacco Never Used     Counseling given: Not Answered   Clinical Intake:  Pre-visit preparation completed: No  Pain : No/denies pain     Diabetes: No  How often do you need to have someone help you when you read instructions, pamphlets, or other written materials from your doctor or pharmacy?: 2 - Rarely What is the last grade level you completed in school?: High school  Interpreter Needed?: No  Information entered by :: [name] Russell, RN  Past Medical History:  Diagnosis Date  . Dysphasia   . Fatty liver   .  Hemiplegia (HCC)   . Hypertension   . Pneumonia   . Pulmonary embolism (HCC)   . 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(HCC)   . Stroke [facility])    Past Surgical History:  Procedure Laterality Date  . FLEXIBLE SIGMOIDOSCOPY N/A 10/21/2016   Procedure: FLEXIBLE SIGMOIDOSCOPY;  Surgeon: Malissa Hippo, MD;  [facility];  Service: Endoscopy;  Laterality: N/A;  . FLEXIBLE SIGMOIDOSCOPY N/A 10/25/2016   Procedure: FLEXIBLE SIGMOIDOSCOPY;  Surgeon: Malissa Hippo, MD;  [facility];  Service: Endoscopy;  Laterality: N/A;  . unable     History reviewed. No pertinent family history. Social History   Socioeconomic History  . Marital status: Single    Spouse name: Not on file  . Number of children: Not on file  . Years of education: Not on file  . Highest education level: Not on file  Occupational History  . Not on file  Social Needs  . Financial resource strain: Not hard at all  . Food insecurity:    Worry: Never true    Inability: Never true  . Transportation needs:    Medical: No    Non-medical: No  Tobacco Use  . Smoking status: Never Smoker  . Smokeless tobacco: Never Used  Substance and Sexual Activity  . Alcohol use: No    Alcohol/week: 0.0 standard drinks  . Drug use: No  .  Sexual activity: Not on file  Lifestyle  . Physical activity:    Days per week: 0 days    Minutes per session: 0 min  . Stress: Not at all  Relationships  . Social connections:    Talks on phone: Never    Gets together: Once a week    Attends religious service: Never    Active member of club or organization: No    Attends meetings of clubs or organizations: Never    Relationship status: Never married  Other Topics Concern  . Not on file  Social History Narrative  . Not on file    Outpatient Encounter Medications as of 03/27/2018  Medication Sig  . acetaminophen (TYLENOL) 325 MG tablet Take 650 mg by mouth every 6 (six) hours as needed for moderate pain.   Marland Kitchen atorvastatin (LIPITOR) 10 MG  tablet Take 10 mg by mouth daily.  . budesonide-formoterol (SYMBICORT) 80-4.5 MCG/ACT inhaler Inhale 2 puffs into the lungs 2 (two) times daily.  . carboxymethylcellulose (REFRESH PLUS) 0.5 % SOLN Place 1 drop into both eyes 3 (three) times daily.   Marland Kitchen erythromycin ophthalmic ointment appy 1 ribbon ophthalmic (eye) 5 times a day  . ipratropium (ATROVENT) 0.02 % nebulizer solution Take 0.5 mg by nebulization every 6 (six) hours.  Marland Kitchen labetalol (NORMODYNE) 200 MG tablet Take 200 mg by mouth daily. For HTN  . lisinopril (PRINIVIL,ZESTRIL) 10 MG tablet Take 10 mg by mouth daily.  Marland Kitchen loratadine (CLARITIN) 10 MG tablet Take 10 mg by mouth daily.  . methimazole (TAPAZOLE) 5 MG tablet Take 5 mg by mouth daily.  . polyethylene glycol (MIRALAX / GLYCOLAX) packet Take 17 g by mouth at bedtime.  . sennosides-docusate sodium (SENOKOT-S) 8.6-50 MG tablet Take 1 tablet by mouth at bedtime. For constipation  . shark liver oil-cocoa butter (HEMORRHOIDAL) 0.25-88.44 % suppository Place 1 suppository rectally at bedtime as needed for hemorrhoids.  Marland Kitchen umeclidinium bromide (INCRUSE ELLIPTA) 62.5 MCG/INH AEPB Inhale 1 puff into the lungs daily.  Marland Kitchen warfarin (COUMADIN) 4 MG tablet Take 4 mg tablet by mouth once a day   No facility-administered encounter medications on file as of 03/27/2018.     Activities of Daily Living In your present state of health, do you have any difficulty performing the following activities: 03/27/2018  Hearing? N  Vision? Y  Difficulty concentrating or making decisions? N  Walking or climbing stairs? Y  Dressing or bathing? Y  Doing errands, shopping? Y  Preparing Food and eating ? Y  Using the Toilet? Y  In the past six months, have you accidently leaked urine? Y  Do you have problems with loss of bowel control? Y  Managing your Medications? Y  Managing your Finances? Y  Housekeeping or managing your Housekeeping? Y  Some recent data might be hidden    Patient Care Team: [name] Gammon, [name]   Assessment:   This is a routine wellness examination for [name] [name].  Exercise Activities and Dietary recommendations Current Exercise Habits: The patient does not participate in regular exercise at present, Exercise limited by: orthopedic condition(s)  Goals   None     Fall Risk Fall Risk  03/27/2018 04/05/2017 03/17/2017  Falls in the past year? No No No  Risk for fall due to : - Impaired balance/gait;Impaired mobility;Medication side effect;Impaired vision;History of fall(s) -   Is the patient's home free of loose throw rugs in walkways, pet beds, electrical cords, etc?   yes  Grab bars in the bathroom? yes      Handrails on the stairs?   yes      Adequate lighting?   yes  Depression Screen PHQ 2/9 Scores 03/27/2018 04/05/2017 03/17/2017  PHQ - 2 Score 0 0 0    Cognitive Function     6CIT Screen 03/27/2018 03/17/2017  What Year? 0 points 4 points  What month? 0 points 0 points  What time? 0 points 0 points  Count back from 20 0 points 0 points  Months in reverse 4 points 4 points  Repeat phrase 6 points 6 points  Total Score 10 14    Immunization History  Administered Date(s) Administered  . Influenza-Unspecified 03/28/2014, 03/23/2016, 03/25/2017  . Pneumococcal Conjugate-13 04/21/2015  . Pneumococcal-Unspecified 11/01/2009, 03/30/2016  . Tdap 03/17/2017    Qualifies for Shingles Vaccine? Not in past records  Screening Tests Health Maintenance  Topic Date Due  . INFLUENZA VACCINE  04/09/2018 (Originally 01/19/2018)  . COLONOSCOPY  10/30/2025 (Originally 11/08/1995)  . TETANUS/TDAP  03/18/2027  . Hepatitis C Screening  Completed  . PNA vac Low Risk Adult  Completed   Cancer Screenings: Lung: Low Dose CT Chest recommended if Age 69-80 years, 30 pack-year currently smoking OR have quit w/in 15years. Patient does not qualify. Colorectal: due, PCP will determine if necessary  Additional Screenings:  Hepatitis  C Screening: declined  Flu vaccine due: will receive at [facility]    Plan:  I have personally reviewed and addressed the Medicare Annual Wellness questionnaire and have noted the following in the patient's chart:  A. Medical and social history B. Use of alcohol, tobacco or illicit drugs  C. Current medications and supplements D. Functional ability and status E.  Nutritional status F.  Physical activity G. Advance directives H. List of other physicians I.  Hospitalizations, surgeries, and ER visits in previous 12 months J.  Vitals K. Screenings to include hearing, vision, cognitive, depression L. Referrals and appointments - none  In addition, I have reviewed and discussed with patient certain preventive protocols, quality metrics, and best practice recommendations. A written personalized care plan for preventive services as well as general preventive health recommendations were provided to patient.  See attached scanned questionnaire for additional information.   Signed,   [name] Russell, RN Nurse Health Advisor  Patient Concerns:

## 2018-03-27 NOTE — Patient Instructions (Signed)
[name] [name] , Thank you for taking time to come for your Medicare Wellness Visit. I appreciate your ongoing commitment to your health goals. Please review the following plan we discussed and let me know if I can assist you in the future.   Screening recommendations/referrals: Colonoscopy due-PCP will determine if necessary Recommended yearly ophthalmology/optometry visit for glaucoma screening and checkup Recommended yearly dental visit for hygiene and checkup  Vaccinations: Influenza vaccine due, will receive at [facility] Pneumococcal vaccine up to date, completed Tdap vaccine up to date, due 03/18/2027 Shingles vaccine not in past records    Advanced directives: in chart  Conditions/risks identified: none  Next appointment: Dr. Chales Abrahams makes rounds  Preventive Care 72 Years and Older, Male Preventive care refers to lifestyle choices and visits with your health care provider that can promote health and wellness. What does preventive care include?  A yearly physical exam. This is also called an annual well check.  Dental exams once or twice a year.  Routine eye exams. Ask your health care provider how often you should have your eyes checked.  Personal lifestyle choices, including:  Daily care of your teeth and gums.  Regular physical activity.  Eating a healthy diet.  Avoiding tobacco and drug use.  Limiting alcohol use.  Practicing safe sex.  Taking low doses of aspirin every day.  Taking vitamin and mineral supplements as recommended by your health care provider. What happens during an annual well check? The services and screenings done by your health care provider during your annual well check will depend on your age, overall health, lifestyle risk factors, and family history of disease. Counseling  Your health care provider may ask you questions about your:  Alcohol use.  Tobacco use.  Drug use.  Emotional well-being.  Home and relationship well-being.  Sexual  activity.  Eating habits.  History of falls.  Memory and ability to understand (cognition).  Work and work Astronomer. Screening  You may have the following tests or measurements:  Height, weight, and BMI.  Blood pressure.  Lipid and cholesterol levels. These may be checked every 5 years, or more frequently if you are over 10 years old.  Skin check.  Lung cancer screening. You may have this screening every year starting at age 45 if you have a 30-pack-year history of smoking and currently smoke or have quit within the past 15 years.  Fecal occult blood test (FOBT) of the stool. You may have this test every year starting at age 39.  Flexible sigmoidoscopy or colonoscopy. You may have a sigmoidoscopy every 5 years or a colonoscopy every 10 years starting at age 40.  Prostate cancer screening. Recommendations will vary depending on your family history and other risks.  Hepatitis C blood test.  Hepatitis B blood test.  Sexually transmitted disease (STD) testing.  Diabetes screening. This is done by checking your blood sugar (glucose) after you have not eaten for a while (fasting). You may have this done every 1-3 years.  Abdominal aortic aneurysm (AAA) screening. You may need this if you are a current or former smoker.  Osteoporosis. You may be screened starting at age 18 if you are at high risk. Talk with your health care provider about your test results, treatment options, and if necessary, the need for more tests. Vaccines  Your health care provider may recommend certain vaccines, such as:  Influenza vaccine. This is recommended every year.  Tetanus, diphtheria, and acellular pertussis (Tdap, Td) vaccine. You may need a Td  booster every 10 years.  Zoster vaccine. You may need this after age 55.  Pneumococcal 13-valent conjugate (PCV13) vaccine. One dose is recommended after age 47.  Pneumococcal polysaccharide (PPSV23) vaccine. One dose is recommended after age  39. Talk to your health care provider about which screenings and vaccines you need and how often you need them. This information is not intended to replace advice given to you by your health care provider. Make sure you discuss any questions you have with your health care provider. Document Released: 07/04/2015 Document Revised: 02/25/2016 Document Reviewed: 04/08/2015 Elsevier Interactive Patient Education  2017 [employer] Prevention in the Home Falls can cause injuries. They can happen to people of all ages. There are many things you can do to make your home safe and to help prevent falls. What can I do on the outside of my home?  Regularly fix the edges of walkways and driveways and fix any cracks.  Remove anything that might make you trip as you walk through a door, such as a raised step or threshold.  Trim any bushes or trees on the path to your home.  Use bright outdoor lighting.  Clear any walking paths of anything that might make someone trip, such as rocks or tools.  Regularly check to see if handrails are loose or broken. Make sure that both sides of any steps have handrails.  Any raised decks and porches should have guardrails on the edges.  Have any leaves, snow, or ice cleared regularly.  Use sand or salt on walking paths during winter.  Clean up any spills in your garage right away. This includes oil or grease spills. What can I do in the bathroom?  Use night lights.  Install grab bars by the toilet and in the tub and shower. Do not use towel bars as grab bars.  Use non-skid mats or decals in the tub or shower.  If you need to sit down in the shower, use a plastic, non-slip stool.  Keep the floor dry. Clean up any water that spills on the floor as soon as it happens.  Remove soap buildup in the tub or shower regularly.  Attach bath mats securely with double-sided non-slip rug tape.  Do not have throw rugs and other things on the floor that can make  you trip. What can I do in the bedroom?  Use night lights.  Make sure that you have a light by your bed that is easy to reach.  Do not use any sheets or blankets that are too big for your bed. They should not hang down onto the floor.  Have a firm chair that has side arms. You can use this for support while you get dressed.  Do not have throw rugs and other things on the floor that can make you trip. What can I do in the kitchen?  Clean up any spills right away.  Avoid walking on wet floors.  Keep items that you use a lot in easy-to-reach places.  If you need to reach something above you, use a strong step stool that has a grab bar.  Keep electrical cords out of the way.  Do not use floor polish or wax that makes floors slippery. If you must use wax, use non-skid floor wax.  Do not have throw rugs and other things on the floor that can make you trip. What can I do with my stairs?  Do not leave any items on the stairs.  Make sure that there are handrails on both sides of the stairs and use them. Fix handrails that are broken or loose. Make sure that handrails are as long as the stairways.  Check any carpeting to make sure that it is firmly attached to the stairs. Fix any carpet that is loose or worn.  Avoid having throw rugs at the top or bottom of the stairs. If you do have throw rugs, attach them to the floor with carpet tape.  Make sure that you have a light switch at the top of the stairs and the bottom of the stairs. If you do not have them, ask someone to add them for you. What else can I do to help prevent falls?  Wear shoes that:  Do not have high heels.  Have rubber bottoms.  Are comfortable and fit you well.  Are closed at the toe. Do not wear sandals.  If you use a stepladder:  Make sure that it is fully opened. Do not climb a closed stepladder.  Make sure that both sides of the stepladder are locked into place.  Ask someone to hold it for you, if  possible.  Clearly mark and make sure that you can see:  Any grab bars or handrails.  First and last steps.  Where the edge of each step is.  Use tools that help you move around (mobility aids) if they are needed. These include:  Canes.  Walkers.  Scooters.  Crutches.  Turn on the lights when you go into a dark area. Replace any light bulbs as soon as they burn out.  Set up your furniture so you have a clear path. Avoid moving your furniture around.  If any of your floors are uneven, fix them.  If there are any pets around you, be aware of where they are.  Review your medicines with your doctor. Some medicines can make you feel dizzy. This can increase your chance of falling. Ask your doctor what other things that you can do to help prevent falls. This information is not intended to replace advice given to you by your health care provider. Make sure you discuss any questions you have with your health care provider. Document Released: 04/03/2009 Document Revised: 11/13/2015 Document Reviewed: 07/12/2014 Elsevier Interactive Patient Education  2017 [employer].

## 2018-03-28 ENCOUNTER — Encounter (HOSPITAL_COMMUNITY)
Admission: RE | Admit: 2018-03-28 | Discharge: 2018-03-28 | Disposition: A | Discharge status: Home / Self Care | Payer: Medicare Other | Source: Skilled Nursing Facility | Attending: *Deleted | Admitting: *Deleted

## 2018-03-28 ENCOUNTER — Encounter (HOSPITAL_COMMUNITY)
Admission: RE | Admit: 2018-03-28 | Discharge: 2018-03-28 | Disposition: A | Discharge status: Home / Self Care | Payer: Medicare Other | Source: Skilled Nursing Facility | Attending: Internal Medicine | Admitting: Internal Medicine

## 2018-03-28 DIAGNOSIS — Z7901 Long term (current) use of anticoagulants: Secondary | ICD-10-CM | POA: Diagnosis not present

## 2018-03-28 DIAGNOSIS — R1312 Dysphagia, oropharyngeal phase: Secondary | ICD-10-CM | POA: Diagnosis not present

## 2018-03-28 DIAGNOSIS — I69328 Other speech and language deficits following cerebral infarction: Secondary | ICD-10-CM | POA: Diagnosis not present

## 2018-03-28 DIAGNOSIS — I10 Essential (primary) hypertension: Secondary | ICD-10-CM | POA: Diagnosis not present

## 2018-03-28 DIAGNOSIS — M6281 Muscle weakness (generalized): Secondary | ICD-10-CM | POA: Diagnosis not present

## 2018-03-28 LAB — PROTIME-INR
INR: 2.43
Prothrombin Time: 26.2 seconds — ABNORMAL HIGH (ref 11.4–15.2)

## 2018-04-05 DIAGNOSIS — B351 Tinea unguium: Secondary | ICD-10-CM | POA: Diagnosis not present

## 2018-04-05 DIAGNOSIS — M2141 Flat foot [pes planus] (acquired), right foot: Secondary | ICD-10-CM | POA: Diagnosis not present

## 2018-04-05 DIAGNOSIS — M2142 Flat foot [pes planus] (acquired), left foot: Secondary | ICD-10-CM | POA: Diagnosis not present

## 2018-04-05 DIAGNOSIS — I739 Peripheral vascular disease, unspecified: Secondary | ICD-10-CM | POA: Diagnosis not present

## 2018-04-06 ENCOUNTER — Encounter (HOSPITAL_COMMUNITY)
Admission: RE | Admit: 2018-04-06 | Discharge: 2018-04-06 | Disposition: A | Discharge status: Home / Self Care | Payer: Medicare Other | Source: Skilled Nursing Facility | Attending: Internal Medicine | Admitting: Internal Medicine

## 2018-04-06 DIAGNOSIS — R1312 Dysphagia, oropharyngeal phase: Secondary | ICD-10-CM | POA: Diagnosis not present

## 2018-04-06 DIAGNOSIS — K562 Volvulus: Secondary | ICD-10-CM | POA: Insufficient documentation

## 2018-04-06 DIAGNOSIS — I10 Essential (primary) hypertension: Secondary | ICD-10-CM | POA: Insufficient documentation

## 2018-04-06 DIAGNOSIS — M6281 Muscle weakness (generalized): Secondary | ICD-10-CM | POA: Diagnosis not present

## 2018-04-06 DIAGNOSIS — I69328 Other speech and language deficits following cerebral infarction: Secondary | ICD-10-CM | POA: Diagnosis not present

## 2018-04-06 LAB — PROTIME-INR
INR: 2.56
PROTHROMBIN TIME: 27.1 s — AB (ref 11.4–15.2)

## 2018-04-19 ENCOUNTER — Encounter: Payer: Self-pay | Admitting: Internal Medicine

## 2018-04-19 ENCOUNTER — Non-Acute Institutional Stay (SKILLED_NURSING_FACILITY): Payer: Medicare Other | Admitting: Internal Medicine

## 2018-04-19 DIAGNOSIS — K562 Volvulus: Secondary | ICD-10-CM

## 2018-04-19 DIAGNOSIS — E0590 Thyrotoxicosis, unspecified without thyrotoxic crisis or storm: Secondary | ICD-10-CM | POA: Diagnosis not present

## 2018-04-19 DIAGNOSIS — I10 Essential (primary) hypertension: Secondary | ICD-10-CM | POA: Diagnosis not present

## 2018-04-19 DIAGNOSIS — I2782 Chronic pulmonary embolism: Secondary | ICD-10-CM | POA: Diagnosis not present

## 2018-04-19 DIAGNOSIS — J410 Simple chronic bronchitis: Secondary | ICD-10-CM | POA: Diagnosis not present

## 2018-04-19 NOTE — Progress Notes (Signed)
Location:    [facility] Room Number: 112/W Place of Service:  SNF [phone]) Provider:  Sabino Dick, MD  [name], [name]  Extended Emergency Contact Information Primary Emergency Contact: [name] Address: [address]          [place], [place] [address] [place] of [place] Home Phone: [phone] Mobile Phone: [phone] Relation: Brother  Code Status:  Full Code Goals of care: Advanced Directive information Advanced Directives 03/27/2018  Does Patient Have a Medical Advance Directive? Yes  Type of Advance Directive (No Data)  Does patient want to make changes to medical advance directive? No - Patient declined  Copy of Healthcare Power of Attorney in Chart? No - copy requested  Would patient like information on creating a medical advance directive? No - Patient declined     Chief Complaint  Patient presents with  . Acute Visit    Elevated BP    HPI:  Pt is a 72 y.o. male seen today for an acute visit for follow-up of hypertension.  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  He was seen recently for elevated blood pressure he was on labetalol 200 mg a day as well as lisinopril 10 mg a day we increase this to 20 mg a day about 6 weeks ago.  Appears blood pressure still are somewhat elevated 150/95 this morning- sometimes per nursing is up as high in the 160s and 170s- diastolic appears to be in the 08M to 90s I got a manual reading of 138/90 this morning.  He is not complaining of any dizziness  headache numbness.  His other medical issues appear to be stable he continues with right-sided hemiparesis with history of CVA but does well ambulating about facility in his wheelchair he still appears to be motivated to be active.  He also is on Coumadin with a history of PE and DVT as well as CVA- INR has been therapeutic at 2.56 on lab done on October 17 update INR is pending.  Currently again he has no complaints continues to ambulate about hallway in the wheelchair using his left upper and lower extremities for ambulation     Past Medical History:  Diagnosis Date  . Dysphasia   . Fatty liver   . Hemiplegia (HCC)   . Hypertension   . Pneumonia   . Pulmonary embolism (HCC)   . 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(HCC)   . Stroke [facility])    Past Surgical History:  Procedure Laterality Date  . FLEXIBLE SIGMOIDOSCOPY N/A 10/21/2016   Procedure: FLEXIBLE SIGMOIDOSCOPY;  Surgeon: Malissa Hippo, MD;  Location: [facility];  Service: Endoscopy;  Laterality: N/A;  . FLEXIBLE SIGMOIDOSCOPY N/A 10/25/2016   Procedure: FLEXIBLE SIGMOIDOSCOPY;  Surgeon: Malissa Hippo, MD;  Location: [facility];  Service: Endoscopy;  Laterality: N/A;  . unable      Allergies  Allergen Reactions  . Penicillins     Has patient had a PCN reaction causing immediate rash, facial/tongue/throat swelling, SOB or lightheadedness with hypotension: unknown Has patient had a PCN reaction causing severe  rash involving mucus membranes or skin necrosis: unknown Has patient had a PCN reaction that required hospitalization: unknown Has patient had a PCN reaction occurring within the last 10 years: unknown If all of the above answers are "NO", then may proceed with Cephalosporin use. 5/3 Tolerated Cefepime x 1 dose in ED.     Outpatient Encounter Medications as of 04/19/2018  Medication Sig  . acetaminophen (TYLENOL) 325 MG tablet Take 650 mg by mouth every 6 (six) hours as needed for moderate pain.   Marland Kitchen atorvastatin (LIPITOR) 10  MG tablet Take 10 mg by mouth daily.  . budesonide-formoterol (SYMBICORT) 80-4.5 MCG/ACT inhaler Inhale 2 puffs into the lungs 2 (two) times daily.  . carboxymethylcellulose (REFRESH PLUS) 0.5 % SOLN Place 1 drop into both eyes 3 (three) times daily.   Marland Kitchen erythromycin ophthalmic ointment appy 1 ribbon ophthalmic (eye) 5 times a day  . ipratropium (ATROVENT) 0.02 % nebulizer solution Take 0.5 mg by nebulization every 6 (six) hours.  Marland Kitchen labetalol (NORMODYNE) 200 MG tablet Take 200 mg by mouth daily. For HTN  . lisinopril (PRINIVIL,ZESTRIL) 10 MG tablet Take 20 mg by mouth daily.   Marland Kitchen loratadine (CLARITIN) 10 MG tablet Take 10 mg by mouth daily.  . methimazole (TAPAZOLE) 5 MG tablet Take 5 mg by mouth daily.  . polyethylene glycol (MIRALAX / GLYCOLAX) packet Take 17 g by mouth at bedtime.  . sennosides-docusate sodium (SENOKOT-S) 8.6-50 MG tablet Take 1 tablet by mouth at bedtime. For constipation  . shark liver oil-cocoa butter (HEMORRHOIDAL) 0.25-88.44 % suppository Place 1 suppository rectally at bedtime as needed for hemorrhoids.  Marland Kitchen umeclidinium bromide (INCRUSE ELLIPTA) 62.5 MCG/INH AEPB Inhale 1 puff into the lungs daily.  Marland Kitchen warfarin (COUMADIN) 1 MG tablet Take 1 mg by mouth. Take along with 4 mg to = 4.5 mg on Tues., Thurs., Sat.  . warfarin (COUMADIN) 4 MG tablet Take 4 mg tablet by mouth once a day on Sun., Mon.,Wed.,Fri.  . warfarin (COUMADIN) 4 MG tablet Take 4 mg by mouth daily. Take along with 1 mg to = 4.5 mg on Tues.,Thurs., Sat.   No facility-administered encounter medications on file as of 04/19/2018.     Review of Systems   In general is not complaining of fever chills has no complaints today.  Skin does not complain of rashes or itching.  Head ears eyes nose mouth and throat he does have a corneal abrasion left eye actually says he has some vision in that eye now.  Does not complain of sore throat.  Respiratory is not complaining shortness of breath or cough.  Cardiac  denies chest pain has baseline lower extremity edema.  GI is not complaining of abdominal discomfort nausea vomiting diarrhea constipation is having regular bowel movements.  Musculoskeletal has right-sided hemiparesis-does not complain of joint pain  Neurologic--does not complain of dizziness headache syncope or weakness beyond baseline.  Psych does not complain of being depressed or anxious continues to be motivated and in good spirits and pleasant  Immunization History  Administered Date(s) Administered  . Influenza-Unspecified 03/28/2014, 03/23/2016, 03/25/2017  . Pneumococcal Conjugate-13 04/21/2015  . Pneumococcal-Unspecified 11/01/2009, 03/30/2016  . Tdap 03/17/2017   Pertinent  Health Maintenance Due  Topic Date Due  . COLONOSCOPY  10/30/2025 (Originally 11/08/1995)  . INFLUENZA VACCINE  Completed  . PNA vac Low Risk Adult  Completed   Fall Risk  03/27/2018 04/05/2017 03/17/2017  Falls in the past year? No No No  Risk for fall due to : -  Impaired balance/gait;Impaired mobility;Medication side effect;Impaired vision;History of fall(s) -   Functional Status Survey:    Vitals:   04/19/18 1029  BP: (!) 150/95  Pulse: 77  Resp: 20  Temp: (!) 97.2 F (36.2 C)  TempSrc: Oral  SpO2: 92%  Manual blood pressure today 138/98-previous blood pressures please see HPI  Physical Exam   In general this is a pleasant elderly male in no distress sitting comfortably in his wheelchair.  His skin is warm and dry.  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  Oropharynx clear mucous membranes moist.  Chest initially had a small amount of rhonchi but this cleared with cough there is no labored breathing.  Heart is regular rate and rhythm without murmur gallop rub with chronic venous stasis edema lower extremities bilaterally.  His abdomen is obese soft  nontender with positive bowel sounds.  Musculoskeletal continues with right-sided hemiparesis at baseline moves his left upper and lower extremities at baseline and continues to ambulate in his wheelchair quite well using these.  Neurologic as noted above his speech is slurred at baseline-he does have right-sided hemiparalysis.  Psych he is alert and oriented pleasant and appropriate   Labs reviewed: Recent Labs    11/16/17 0709 01/23/18 0315 02/16/18 0739  NA 138 138 138  K 4.1 4.1 4.1  CL 102 102 102  CO2 28 29 27   GLUCOSE 82 78 77  BUN 20 24* 21  CREATININE 1.07 1.32* 1.19  CALCIUM 8.7* 8.3* 8.7*   Recent Labs    07/19/17 0709  AST 25  ALT 29  ALKPHOS 79  BILITOT 0.7  PROT 7.3  ALBUMIN 3.3*   Recent Labs    07/19/17 0709 11/16/17 0709 01/23/18 0315  WBC 7.5 8.2 10.3  NEUTROABS 4.1  --   --   HGB 15.5 15.9 14.4  HCT 48.6 48.5 44.4  MCV 93.8 95.3 96.5  PLT 297 269 281   Lab Results  Component Value Date   TSH 3.657 12/15/2017   Lab Results  Component Value Date   HGBA1C  03/27/2009    6.0 (NOTE) The ADA recommends the following therapeutic goal for glycemic control related to Hgb A1c measurement: Goal of therapy: <6.5 Hgb A1c  Reference: American Diabetes Association: Clinical Practice Recommendations 2010, Diabetes Care, 2010, 33: (Suppl  1).   Lab Results  Component Value Date   CHOL 111 11/16/2017   HDL 42 11/16/2017   LDLCALC 58 11/16/2017   TRIG 54 11/16/2017   CHOLHDL 2.6 11/16/2017    Significant Diagnostic Results in last 30 days:  No results found.  Assessment/Plan  #1 hypertension- it appears his blood pressures still are somewhat elevated-will increase his lisinopril to 30 mg a day and monitor- nursing staff says at times his diastolic is slightly over 100 although this is not consistent- systolics apparently are 140 and above with some frequency.  Continue labetalol 200 mg twice daily.  Also will update a metabolic panel for  updated values since we are increasing his ACE inhibitor.  2.-  History of pulmonary embolism DVT in the past INR is now therapeutic at 2.56 have been slightly subtherapeutic previously update INR is pending.  3.  History of CVA has right-sided hemiparesis at baseline appears to be stable he is on a statin LDL was 58 on a lab done in May  #  4 history of COPD continues on Incruse Ellipta and Symbicort- any congestion cleared with cough today -at times  he will have some congestion but this has been baseline he is not complaining of any shortness of breath or increased cough  #5 history of sigmoid volvulus he is on MiraLAX and this has resolved-this has not recurred now for some time at one point was an issue which actually required hospital admission--abdominal exam today was fairly benign  #6 history of subclinical hypothyroidism he has been followed by endocrinology and is on Tapazole- TSH was 3.657 in June-  [id number]

## 2018-04-20 ENCOUNTER — Encounter (HOSPITAL_COMMUNITY)
Admission: RE | Admit: 2018-04-20 | Discharge: 2018-04-20 | Disposition: A | Discharge status: Home / Self Care | Payer: Medicare Other | Source: Skilled Nursing Facility | Attending: Internal Medicine | Admitting: Internal Medicine

## 2018-04-20 DIAGNOSIS — K562 Volvulus: Secondary | ICD-10-CM | POA: Diagnosis not present

## 2018-04-20 DIAGNOSIS — R1312 Dysphagia, oropharyngeal phase: Secondary | ICD-10-CM | POA: Insufficient documentation

## 2018-04-20 DIAGNOSIS — I69328 Other speech and language deficits following cerebral infarction: Secondary | ICD-10-CM | POA: Insufficient documentation

## 2018-04-20 DIAGNOSIS — I10 Essential (primary) hypertension: Secondary | ICD-10-CM | POA: Insufficient documentation

## 2018-04-20 DIAGNOSIS — M6281 Muscle weakness (generalized): Secondary | ICD-10-CM | POA: Diagnosis not present

## 2018-04-20 LAB — CBC WITH DIFFERENTIAL/PLATELET
Abs Immature Granulocytes: 0.01 10*3/uL (ref 0.00–0.07)
BASOS ABS: 0 10*3/uL (ref 0.0–0.1)
Basophils Relative: 0 %
Eosinophils Absolute: 0.3 10*3/uL (ref 0.0–0.5)
Eosinophils Relative: 5 %
HEMATOCRIT: 49.7 % (ref 39.0–52.0)
HEMOGLOBIN: 16 g/dL (ref 13.0–17.0)
IMMATURE GRANULOCYTES: 0 %
LYMPHS ABS: 2.1 10*3/uL (ref 0.7–4.0)
LYMPHS PCT: 31 %
MCH: 31.2 pg (ref 26.0–34.0)
MCHC: 32.2 g/dL (ref 30.0–36.0)
MCV: 96.9 fL (ref 80.0–100.0)
Monocytes Absolute: 0.5 10*3/uL (ref 0.1–1.0)
Monocytes Relative: 8 %
NEUTROS ABS: 3.8 10*3/uL (ref 1.7–7.7)
NEUTROS PCT: 56 %
NRBC: 0 % (ref 0.0–0.2)
Platelets: 246 10*3/uL (ref 150–400)
RBC: 5.13 MIL/uL (ref 4.22–5.81)
RDW: 13.4 % (ref 11.5–15.5)
WBC: 6.7 10*3/uL (ref 4.0–10.5)

## 2018-04-20 LAB — BASIC METABOLIC PANEL
ANION GAP: 9 (ref 5–15)
BUN: 24 mg/dL — ABNORMAL HIGH (ref 8–23)
CALCIUM: 8.7 mg/dL — AB (ref 8.9–10.3)
CO2: 27 mmol/L (ref 22–32)
Chloride: 103 mmol/L (ref 98–111)
Creatinine, Ser: 1.14 mg/dL (ref 0.61–1.24)
GFR calc Af Amer: >60 mL/min (ref >60)
GFR calc non Af Amer: >60 mL/min (ref >60)
GLUCOSE: 78 mg/dL (ref 70–99)
POTASSIUM: 4.3 mmol/L (ref 3.5–5.1)
Sodium: 139 mmol/L (ref 135–145)

## 2018-04-20 LAB — PROTIME-INR
INR: 2.29
Prothrombin Time: 24.9 seconds — ABNORMAL HIGH (ref 11.4–15.2)

## 2018-04-24 ENCOUNTER — Encounter (HOSPITAL_COMMUNITY)
Admission: RE | Admit: 2018-04-24 | Discharge: 2018-04-24 | Disposition: A | Discharge status: Home / Self Care | Payer: Medicare Other | Source: Skilled Nursing Facility | Attending: Internal Medicine | Admitting: Internal Medicine

## 2018-04-24 DIAGNOSIS — I69328 Other speech and language deficits following cerebral infarction: Secondary | ICD-10-CM | POA: Diagnosis not present

## 2018-04-24 DIAGNOSIS — K562 Volvulus: Secondary | ICD-10-CM | POA: Insufficient documentation

## 2018-04-24 DIAGNOSIS — M6281 Muscle weakness (generalized): Secondary | ICD-10-CM | POA: Insufficient documentation

## 2018-04-24 DIAGNOSIS — I10 Essential (primary) hypertension: Secondary | ICD-10-CM | POA: Insufficient documentation

## 2018-04-24 DIAGNOSIS — R1312 Dysphagia, oropharyngeal phase: Secondary | ICD-10-CM | POA: Insufficient documentation

## 2018-04-24 LAB — T4, FREE: Free T4: 0.84 ng/dL (ref 0.82–1.77)

## 2018-04-24 LAB — TSH: TSH: 3.326 u[IU]/mL (ref 0.350–4.500)

## 2018-04-25 ENCOUNTER — Encounter: Payer: Self-pay | Admitting: Internal Medicine

## 2018-04-25 ENCOUNTER — Non-Acute Institutional Stay (SKILLED_NURSING_FACILITY): Payer: Medicare Other | Admitting: Internal Medicine

## 2018-04-25 DIAGNOSIS — E0590 Thyrotoxicosis, unspecified without thyrotoxic crisis or storm: Secondary | ICD-10-CM | POA: Diagnosis not present

## 2018-04-25 DIAGNOSIS — I10 Essential (primary) hypertension: Secondary | ICD-10-CM | POA: Diagnosis not present

## 2018-04-25 DIAGNOSIS — I2782 Chronic pulmonary embolism: Secondary | ICD-10-CM | POA: Diagnosis not present

## 2018-04-25 DIAGNOSIS — J410 Simple chronic bronchitis: Secondary | ICD-10-CM | POA: Diagnosis not present

## 2018-04-25 DIAGNOSIS — I639 Cerebral infarction, unspecified: Secondary | ICD-10-CM | POA: Diagnosis not present

## 2018-04-25 NOTE — Progress Notes (Signed)
Location:    [facility] Room Number: 112/W Place of Service:  SNF [id number]) Provider: Einar Crow MD  Mahlon Gammon, MD  [name], [name]  Extended Emergency Contact Information Primary Emergency Contact: [name] Address: [address]          [place], [place] [address] [place] Home Phone: [phone] Mobile Phone: [phone] Relation: Brother  Code Status:  Full Code Goals of care: Advanced Directive information Advanced Directives 04/25/2018  Does Patient Have a Medical Advance Directive? Yes  Type of Advance Directive (No Data)  Does patient want to make changes to medical advance directive? No - Patient declined  Copy of Healthcare Power of Attorney in Chart? No - copy requested  Would patient like information on creating a medical advance directive? No - Patient declined     Chief Complaint  Patient presents with  . Medical Management of Chronic Issues    Routine visit of medical management,Patient due Colonoscopy    HPI:  Pt is a 72 y.o. male seen today for medical management of chronic diseases.   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 And Left Corneal Abrasion   Patient is doing well in facility he has actually gained almost 10 pounds since my last visit and is up to 2 08 pounds.  He today had cough with congestion but no shortness of breath or chest pain or fever He also wanted to know if he can see a dentist and his brother wants him to see in-house dentist He follows with ophthalmology for his left eye abrasion is not and is on chronic azithromycin ointment No new nursing issues.  His bowels are moving well.  Past Medical History:  Diagnosis Date  . Dysphasia   . Fatty liver   . Hemiplegia (HCC)   . Hypertension   . Pneumonia   .  Pulmonary embolism (HCC)   . 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(HCC)   . Stroke [facility])    Past Surgical History:  Procedure Laterality Date  . FLEXIBLE SIGMOIDOSCOPY N/A 10/21/2016   Procedure: FLEXIBLE SIGMOIDOSCOPY;  Surgeon: Malissa Hippo, MD;  [facility];  Service: Endoscopy;  Laterality: N/A;  . FLEXIBLE SIGMOIDOSCOPY N/A 10/25/2016   Procedure: FLEXIBLE SIGMOIDOSCOPY;  Surgeon: Malissa Hippo, MD;  Location: [facility];  Service: Endoscopy;  Laterality: N/A;  . unable      Allergies  Allergen Reactions  . Penicillins     Has patient had a PCN reaction causing immediate rash, facial/tongue/throat swelling, SOB or lightheadedness with hypotension: unknown Has patient had a PCN reaction causing severe rash involving mucus membranes or skin necrosis: unknown Has patient had a PCN reaction that required hospitalization: unknown Has patient had a PCN reaction occurring within the last 10 years: unknown If all of the above answers are "NO", then may proceed with Cephalosporin use. 5/3 Tolerated Cefepime x 1 dose in ED.     Outpatient Encounter Medications as of 04/25/2018  Medication Sig  . acetaminophen (TYLENOL) 325 MG tablet Take 650 mg by mouth every 6 (six) hours as needed for moderate pain.   Marland Kitchen atorvastatin (LIPITOR) 10 MG tablet Take 10 mg by mouth daily.  . budesonide-formoterol (SYMBICORT) 80-4.5 MCG/ACT inhaler Inhale 2 puffs into the lungs 2 (two) times daily.  . carboxymethylcellulose (REFRESH PLUS) 0.5 % SOLN Place 1  drop into both eyes 3 (three) times daily.   Marland Kitchen erythromycin ophthalmic ointment appy 1 ribbon ophthalmic (eye) 5 times a day  . ipratropium (ATROVENT) 0.02 % nebulizer solution Take 0.5 mg by nebulization every 6 (six) hours.  Marland Kitchen labetalol (NORMODYNE) 200 MG tablet Take 200 mg by mouth daily. For HTN  . lisinopril (PRINIVIL,ZESTRIL) 10 MG tablet Take 30 mg by mouth daily.   Marland Kitchen loratadine (CLARITIN) 10 MG tablet Take 10 mg by mouth daily.  . methimazole  (TAPAZOLE) 5 MG tablet Take 5 mg by mouth daily.  . polyethylene glycol (MIRALAX / GLYCOLAX) packet Take 17 g by mouth at bedtime.  . sennosides-docusate sodium (SENOKOT-S) 8.6-50 MG tablet Take 1 tablet by mouth at bedtime. For constipation  . shark liver oil-cocoa butter (HEMORRHOIDAL) 0.25-88.44 % suppository Place 1 suppository rectally at bedtime as needed for hemorrhoids.  Marland Kitchen umeclidinium bromide (INCRUSE ELLIPTA) 62.5 MCG/INH AEPB Inhale 1 puff into the lungs daily.  Marland Kitchen warfarin (COUMADIN) 1 MG tablet Take 1 mg by mouth. Take along with 4 mg to = 4.5 mg on Tues., Thurs., Sat.  . warfarin (COUMADIN) 4 MG tablet Take 4 mg tablet by mouth once a day on Sun., Mon.,Wed.,Fri.  . warfarin (COUMADIN) 4 MG tablet Take 4 mg by mouth daily. Take along with 1 mg to = 4.5 mg on Tues.,Thurs., Sat.   No facility-administered encounter medications on file as of 04/25/2018.      Review of Systems  Constitutional: Negative.   HENT: Positive for congestion.   Eyes: Positive for discharge.  Respiratory: Positive for cough.   Cardiovascular: Negative.   Gastrointestinal: Negative.   Genitourinary: Negative.   Musculoskeletal: Negative.   Skin: Negative.   Neurological: Negative.   Psychiatric/Behavioral: Negative.     Immunization History  Administered Date(s) Administered  . Influenza-Unspecified 03/28/2014, 03/23/2016, 03/25/2017  . Pneumococcal Conjugate-13 04/21/2015  . Pneumococcal-Unspecified 11/01/2009, 03/30/2016  . Tdap 03/17/2017   Pertinent  Health Maintenance Due  Topic Date Due  . COLONOSCOPY  10/30/2025 (Originally 11/08/1995)  . INFLUENZA VACCINE  Completed  . PNA vac Low Risk Adult  Completed   Fall Risk  03/27/2018 04/05/2017 03/17/2017  Falls in the past year? No No No  Risk for fall due to : - Impaired balance/gait;Impaired mobility;Medication side effect;Impaired vision;History of fall(s) -   Functional Status Survey:    Vitals:   04/25/18 1024  BP: (!) 141/98    Pulse: 74  Resp: 20  Temp: (!) 97.2 F (36.2 C)  TempSrc: Oral  SpO2: 91%  Weight: 208 lb 8 oz (94.6 kg)  Height: 5' 8.5" (1.74 m)   Body mass index is 31.24 kg/m. Physical Exam  Constitutional: He is oriented to person, place, and time. He appears well-developed and well-nourished.  HENT:  Head: Normocephalic.  Mouth/Throat: Oropharynx is clear and moist.  Neck: Neck supple.  Cardiovascular: Normal rate and regular rhythm.  Pulmonary/Chest: Effort normal and breath sounds normal. No stridor. No respiratory distress. He has no wheezes.  He had Rales Bilateral  Abdominal: Soft. Bowel sounds are normal. He exhibits no distension. There is no tenderness. There is no guarding.  Musculoskeletal:  Chronic Venous stasis Changes bilateral  Lymphadenopathy:    He has no cervical adenopathy.  Neurological: He is alert and oriented to person, place, and time.  Right Sided hemiparesis  Skin: Skin is warm and dry.  Psychiatric: He has a normal mood and affect. His behavior is normal.    Labs reviewed: Recent Labs  01/23/18 0315 02/16/18 0739 04/20/18 0709  NA 138 138 139  K 4.1 4.1 4.3  CL 102 102 103  CO2 29 27 27   GLUCOSE 78 77 78  BUN 24* 21 24*  CREATININE 1.32* 1.19 1.14  CALCIUM 8.3* 8.7* 8.7*   Recent Labs    07/19/17 0709  AST 25  ALT 29  ALKPHOS 79  BILITOT 0.7  PROT 7.3  ALBUMIN 3.3*   Recent Labs    07/19/17 0709 11/16/17 0709 01/23/18 0315 04/20/18 0709  WBC 7.5 8.2 10.3 6.7  NEUTROABS 4.1  --   --  3.8  HGB 15.5 15.9 14.4 16.0  HCT 48.6 48.5 44.4 49.7  MCV 93.8 95.3 96.5 96.9  PLT 297 269 281 246   Lab Results  Component Value Date   TSH 3.326 04/24/2018   Lab Results  Component Value Date   HGBA1C  03/27/2009    6.0 (NOTE) The ADA recommends the following therapeutic goal for glycemic control related to Hgb A1c measurement: Goal of therapy: <6.5 Hgb A1c  Reference: American Diabetes Association: Clinical Practice Recommendations 2010,  Diabetes Care, 2010, 33: (Suppl  1).   Lab Results  Component Value Date   CHOL 111 11/16/2017   HDL 42 11/16/2017   LDLCALC 58 11/16/2017   TRIG 54 11/16/2017   CHOLHDL 2.6 11/16/2017    Significant Diagnostic Results in last 30 days:  No results found.  Assessment/Plan COPD with Rales Will Give him Low dose of Prednisone for few days Continue on Symbicort and Incruse Essential hypertension On Labetolol. Recently his BP mildly elevated Will change his Lisinopril to Losartan due to his Cough and add HCTZ BMP in 2 weeks Corneal Abrasion On ErythromycinOintment Followswith Ophthalmology Per them he would need this Chronically Recurrent PE and chronic DVT On Coumadin Dose adjusted for his INR Sigmoid volvulusresolved. Continue on Miralax Patient has done well   Subclinical hyperthyroidism Follow Up With Endocrinology On Tapazole  S/P CVA Patient stable.  He is now on Dyphagic Diet and doing well. On Statin On Coumadin Hyperlipidemia On Low dose of statin LDL 58 in 05/19 Repeat Lipid Profile Health Maintenance Will Write for In [facility]     Family/ staff Communication:   Labs/tests ordered:

## 2018-04-26 ENCOUNTER — Ambulatory Visit (INDEPENDENT_AMBULATORY_CARE_PROVIDER_SITE_OTHER): Payer: Medicare Other | Admitting: "Endocrinology

## 2018-04-26 ENCOUNTER — Encounter: Payer: Self-pay | Admitting: "Endocrinology

## 2018-04-26 VITALS — BP 107/72 | HR 78

## 2018-04-26 DIAGNOSIS — E0590 Thyrotoxicosis, unspecified without thyrotoxic crisis or storm: Secondary | ICD-10-CM

## 2018-04-26 DIAGNOSIS — I639 Cerebral infarction, unspecified: Secondary | ICD-10-CM | POA: Diagnosis not present

## 2018-04-26 NOTE — Progress Notes (Signed)
Endocrinology follow-up note   Subjective:    Patient ID: [name] [name], male    DOB: January 22, 1946, PCP Mahlon Gammon, MD   Past Medical History:  Diagnosis Date  . Dysphasia   . Fatty liver   . Hemiplegia (HCC)   . Hypertension   . Pneumonia   . Pulmonary embolism (HCC)   . 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(HCC)   . Stroke [facility])    Past Surgical History:  Procedure Laterality Date  . FLEXIBLE SIGMOIDOSCOPY N/A 10/21/2016   Procedure: FLEXIBLE SIGMOIDOSCOPY;  Surgeon: Malissa Hippo, MD;  [facility];  Service: Endoscopy;  Laterality: N/A;  . FLEXIBLE SIGMOIDOSCOPY N/A 10/25/2016   Procedure: FLEXIBLE SIGMOIDOSCOPY;  Surgeon: Malissa Hippo, MD;  Location: [facility];  Service: Endoscopy;  Laterality: N/A;  . unable     Social History   Socioeconomic History  . Marital status: Single    Spouse name: Not on file  . Number of children: Not on file  . Years of education: Not on file  . Highest education level: Not on file  Occupational History  . Not on file  Social Needs  . Financial resource strain: Not hard at all  . Food insecurity:    Worry: Never true    Inability: Never true  . Transportation needs:    Medical: No    Non-medical: No  Tobacco Use  . Smoking status: Never Smoker  . Smokeless tobacco: Never Used  Substance and Sexual Activity  . Alcohol use: No    Alcohol/week: 0.0 standard drinks  . Drug use: No  . Sexual activity: Not on file  Lifestyle  . Physical activity:    Days per week: 0 days    Minutes per session: 0 min  . Stress: Not at all  Relationships  . Social connections:    Talks on phone: Never    Gets together: Once a week    Attends religious service: Never    Active member of club or organization: No    Attends meetings of clubs or organizations: Never    Relationship status: Never married  Other Topics Concern  . Not on file  Social History Narrative  . Not on file   Outpatient Encounter Medications as of 04/26/2018   Medication Sig  . acetaminophen (TYLENOL) 325 MG tablet Take 650 mg by mouth every 6 (six) hours as needed for moderate pain.   Marland Kitchen atorvastatin (LIPITOR) 10 MG tablet Take 10 mg by mouth daily.  . budesonide-formoterol (SYMBICORT) 80-4.5 MCG/ACT inhaler Inhale 2 puffs into the lungs 2 (two) times daily.  . carboxymethylcellulose (REFRESH PLUS) 0.5 % SOLN Place 1 drop into both eyes 3 (three) times daily.   Marland Kitchen erythromycin ophthalmic ointment appy 1 ribbon ophthalmic (eye) 5 times a day  . ipratropium (ATROVENT) 0.02 % nebulizer solution Take 0.5 mg by nebulization every 6 (six) hours.  Marland Kitchen labetalol (NORMODYNE) 200 MG tablet Take 200 mg by mouth daily. For HTN  . lisinopril (PRINIVIL,ZESTRIL) 10 MG tablet Take 30 mg by mouth daily.   Marland Kitchen loratadine (CLARITIN) 10 MG tablet Take 10 mg by mouth daily.  . polyethylene glycol (MIRALAX / GLYCOLAX) packet Take 17 g by mouth at bedtime.  . sennosides-docusate sodium (SENOKOT-S) 8.6-50 MG tablet Take 1 tablet by mouth at bedtime. For constipation  . shark liver oil-cocoa butter (HEMORRHOIDAL) 0.25-88.44 % suppository Place 1 suppository rectally at bedtime as needed for hemorrhoids.  Marland Kitchen umeclidinium bromide (INCRUSE ELLIPTA) 62.5 MCG/INH AEPB  Inhale 1 puff into the lungs daily.  Marland Kitchen warfarin (COUMADIN) 1 MG tablet Take 1 mg by mouth. Take along with 4 mg to = 4.5 mg on Tues., Thurs., Sat.  . warfarin (COUMADIN) 4 MG tablet Take 4 mg tablet by mouth once a day on Sun., Mon.,Wed.,Fri.  . warfarin (COUMADIN) 4 MG tablet Take 4 mg by mouth daily. Take along with 1 mg to = 4.5 mg on Tues.,Thurs., Sat.  . [DISCONTINUED] methimazole (TAPAZOLE) 5 MG tablet Take 5 mg by mouth daily.   No facility-administered encounter medications on file as of 04/26/2018.    ALLERGIES: Allergies  Allergen Reactions  . Penicillins     Has patient had a PCN reaction causing immediate rash, facial/tongue/throat swelling, SOB or lightheadedness with hypotension: unknown Has patient had  a PCN reaction causing severe rash involving mucus membranes or skin necrosis: unknown Has patient had a PCN reaction that required hospitalization: unknown Has patient had a PCN reaction occurring within the last 10 years: unknown If all of the above answers are "NO", then may proceed with Cephalosporin use. 5/3 Tolerated Cefepime x 1 dose in ED.     VACCINATION STATUS: Immunization History  Administered Date(s) Administered  . Influenza-Unspecified 03/28/2014, 03/23/2016, 03/25/2017  . Pneumococcal Conjugate-13 04/21/2015  . Pneumococcal-Unspecified 11/01/2009, 03/30/2016  . Tdap 03/17/2017    HPI [name] [name] is 72 y.o. male who presents today with a medical history as above. - He is being treated with methimazole 5 mg daily for hyperthyroidism.  He has responded to treatment very well.   -  He has multiple medical problems including stroke with right-sided hemiplegia, nursing home resident. - He also has dysphagia, was found to be losing weight, currently reversing. - He was sent for thyroid uptake and scan which showed nonfocal elevated uptake of 42%.  He is unable to provide history, accompanied by his brother not aware of the details of his medical problems. -He has no new complaints today. 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   Review of Systems  Constitutional: + weight gain,  + fatigue, no subjective hyperthermia, no subjective hypothermia, + wheelchair-bound.  Eyes: no blurry vision, no xerophthalmia ENT: no sore throat, no nodules palpated in throat, no dysphagia/odynophagia, no hoarseness  Musculoskeletal: + Wheelchair-bound ,  stiff muscles on extremities.  Skin: no rashes Neurological: no tremors, no numbness, no tingling, no dizziness Psychiatric: + depression, no anxiety  Objective:    BP 107/72   Pulse 78   Wt Readings from Last 3 Encounters:   04/25/18 208 lb 8 oz (94.6 kg)  03/27/18 200 lb (90.7 kg)  02/15/18 200 lb 6.4 oz (90.9 kg)    Physical Exam  Constitutional: Not in acute distress,  + recliner/wheelchair-bound, with stiff and spastic right upper and lower extremities Eyes: PERRLA, EOMI, no exophthalmos, + drooling ENT: moist mucous membranes, no thyromegaly, no cervical lymphadenopathy CVS: Regular rate and rhythm Musculoskeletal: +  Wheelchair-bound, with stiff and spastic right upper and lower extremities Skin: moist, warm, no rashes Neurological: No tremors on outstretched hands   CMP ( most recent) CMP     Component Value Date/Time   NA 139 04/20/2018 0709   NA 142 09/12/2015   K 4.3 04/20/2018 0709   CL 103 04/20/2018 0709   CO2 27 04/20/2018 0709   GLUCOSE 78 04/20/2018 0709   BUN 24 (H) 04/20/2018 0709   BUN  18 09/12/2015   CREATININE 1.14 04/20/2018 0709   CALCIUM 8.7 (L) 04/20/2018 0709   PROT 7.3 07/19/2017 0709   ALBUMIN 3.3 (L) 07/19/2017 0709   AST 25 07/19/2017 0709   ALT 29 07/19/2017 0709   ALKPHOS 79 07/19/2017 0709   BILITOT 0.7 07/19/2017 0709   GFRNONAA >60 04/20/2018 0709   GFRAA >60 04/20/2018 0709     Diabetic Labs (most recent): Lab Results  Component Value Date   HGBA1C 6.0 04/26/2009   HGBA1C  03/27/2009    6.0 (NOTE) The ADA recommends the following therapeutic goal for glycemic control related to Hgb A1c measurement: Goal of therapy: <6.5 Hgb A1c  Reference: American Diabetes Association: Clinical Practice Recommendations 2010, Diabetes Care, 2010, 33: (Suppl  1).   HGBA1C 6 06/22/2008     Lipid Panel ( most recent) Lipid Panel     Component Value Date/Time   CHOL 111 11/16/2017 0700   TRIG 54 11/16/2017 0700   HDL 42 11/16/2017 0700   CHOLHDL 2.6 11/16/2017 0700   VLDL 11 11/16/2017 0700   LDLCALC 58 11/16/2017 0700     Thyroid uptake and scan showed nonfocal, elevated uptake of 42%  Results for BERT, PTACEK (MRN [id number]) as of 04/26/2018 13:07   Ref. Range 04/24/2018 07:00  TSH Latest Ref Range: 0.350 - 4.500 uIU/mL 3.326  T4,Free(Direct) Latest Ref Range: 0.82 - 1.77 ng/dL 4.09      Assessment & Plan:   1.  Hyperthyroidism -His previsit labs are consistent with the fact that he has responded to low-dose methimazole treatment.  At this time he will be taken off of treatment.  He will have repeat thyroid function test and office visit in 3 months.  He has no particularly specific signs or symptoms of thyrotoxicosis at this time. -He is on labetalol 200 mg per daily related to his high blood pressure.  - I advised patient to maintain close follow up with Mahlon Gammon, MD for  primary care needs.  Follow up plan: Return in about 3 months (around 07/27/2018) for Follow up with Pre-visit Labs.  Marquis Lunch, MD [facility] Phone: [phone]  Fax: [phone]   04/26/2018, 1:05 PM This note was partially dictated with voice recognition software. Similar sounding words can be transcribed inadequately or may not  be corrected upon review.

## 2018-05-04 ENCOUNTER — Encounter (HOSPITAL_COMMUNITY)
Admission: RE | Admit: 2018-05-04 | Discharge: 2018-05-04 | Disposition: A | Discharge status: Home / Self Care | Payer: Medicare Other | Source: Skilled Nursing Facility | Attending: Internal Medicine | Admitting: Internal Medicine

## 2018-05-04 DIAGNOSIS — R1312 Dysphagia, oropharyngeal phase: Secondary | ICD-10-CM | POA: Insufficient documentation

## 2018-05-04 DIAGNOSIS — K562 Volvulus: Secondary | ICD-10-CM | POA: Diagnosis not present

## 2018-05-04 DIAGNOSIS — M6281 Muscle weakness (generalized): Secondary | ICD-10-CM | POA: Insufficient documentation

## 2018-05-04 DIAGNOSIS — R293 Abnormal posture: Secondary | ICD-10-CM | POA: Diagnosis not present

## 2018-05-04 DIAGNOSIS — I69328 Other speech and language deficits following cerebral infarction: Secondary | ICD-10-CM | POA: Diagnosis not present

## 2018-05-04 DIAGNOSIS — I10 Essential (primary) hypertension: Secondary | ICD-10-CM | POA: Diagnosis not present

## 2018-05-04 LAB — COMPREHENSIVE METABOLIC PANEL
ALBUMIN: 3.4 g/dL — AB (ref 3.5–5.0)
ALT: 33 U/L (ref 0–44)
AST: 24 U/L (ref 15–41)
Alkaline Phosphatase: 57 U/L (ref 38–126)
Anion gap: 8 (ref 5–15)
BILIRUBIN TOTAL: 0.8 mg/dL (ref 0.3–1.2)
BUN: 31 mg/dL — AB (ref 8–23)
CO2: 28 mmol/L (ref 22–32)
CREATININE: 1.08 mg/dL (ref 0.61–1.24)
Calcium: 8.6 mg/dL — ABNORMAL LOW (ref 8.9–10.3)
Chloride: 102 mmol/L (ref 98–111)
GFR calc Af Amer: >60 mL/min (ref >60)
GLUCOSE: 85 mg/dL (ref 70–99)
Potassium: 4 mmol/L (ref 3.5–5.1)
Sodium: 138 mmol/L (ref 135–145)
TOTAL PROTEIN: 7.3 g/dL (ref 6.5–8.1)

## 2018-05-04 LAB — PROTIME-INR
INR: 2.35
PROTHROMBIN TIME: 25.4 s — AB (ref 11.4–15.2)

## 2018-05-04 LAB — LIPID PANEL
CHOLESTEROL: 107 mg/dL (ref 0–200)
HDL: 43 mg/dL (ref >40)
LDL Cholesterol: 54 mg/dL (ref 0–99)
TRIGLYCERIDES: 50 mg/dL (ref <150)
Total CHOL/HDL Ratio: 2.5 RATIO
VLDL: 10 mg/dL (ref 0–40)

## 2018-05-23 DIAGNOSIS — H179 Unspecified corneal scar and opacity: Secondary | ICD-10-CM | POA: Diagnosis not present

## 2018-05-25 ENCOUNTER — Encounter (HOSPITAL_COMMUNITY)
Admission: RE | Admit: 2018-05-25 | Discharge: 2018-05-25 | Disposition: A | Discharge status: Home / Self Care | Payer: Medicare Other | Source: Skilled Nursing Facility | Attending: Internal Medicine | Admitting: Internal Medicine

## 2018-05-25 DIAGNOSIS — R1312 Dysphagia, oropharyngeal phase: Secondary | ICD-10-CM | POA: Insufficient documentation

## 2018-05-25 DIAGNOSIS — M6281 Muscle weakness (generalized): Secondary | ICD-10-CM | POA: Diagnosis not present

## 2018-05-25 DIAGNOSIS — I69328 Other speech and language deficits following cerebral infarction: Secondary | ICD-10-CM | POA: Insufficient documentation

## 2018-05-25 DIAGNOSIS — I10 Essential (primary) hypertension: Secondary | ICD-10-CM | POA: Insufficient documentation

## 2018-05-25 LAB — PROTIME-INR
INR: 2.13
PROTHROMBIN TIME: 23.5 s — AB (ref 11.4–15.2)

## 2018-05-26 ENCOUNTER — Encounter (HOSPITAL_COMMUNITY)
Admission: RE | Admit: 2018-05-26 | Discharge: 2018-05-26 | Disposition: A | Discharge status: Home / Self Care | Payer: Medicare Other | Source: Skilled Nursing Facility | Attending: Internal Medicine | Admitting: Internal Medicine

## 2018-05-26 DIAGNOSIS — M6281 Muscle weakness (generalized): Secondary | ICD-10-CM | POA: Diagnosis not present

## 2018-05-26 DIAGNOSIS — I10 Essential (primary) hypertension: Secondary | ICD-10-CM | POA: Diagnosis not present

## 2018-05-26 DIAGNOSIS — I69328 Other speech and language deficits following cerebral infarction: Secondary | ICD-10-CM | POA: Diagnosis not present

## 2018-05-26 LAB — BASIC METABOLIC PANEL
Anion gap: 8 (ref 5–15)
BUN: 24 mg/dL — ABNORMAL HIGH (ref 8–23)
CALCIUM: 8.9 mg/dL (ref 8.9–10.3)
CO2: 30 mmol/L (ref 22–32)
CREATININE: 1.22 mg/dL (ref 0.61–1.24)
Chloride: 102 mmol/L (ref 98–111)
GFR, EST NON AFRICAN AMERICAN: 59 mL/min — AB (ref >60)
Glucose, Bld: 81 mg/dL (ref 70–99)
Potassium: 4 mmol/L (ref 3.5–5.1)
SODIUM: 140 mmol/L (ref 135–145)

## 2018-05-30 ENCOUNTER — Encounter: Payer: Self-pay | Admitting: Internal Medicine

## 2018-05-30 ENCOUNTER — Non-Acute Institutional Stay (SKILLED_NURSING_FACILITY): Payer: Medicare Other | Admitting: Internal Medicine

## 2018-05-30 DIAGNOSIS — K562 Volvulus: Secondary | ICD-10-CM | POA: Diagnosis not present

## 2018-05-30 DIAGNOSIS — I639 Cerebral infarction, unspecified: Secondary | ICD-10-CM | POA: Diagnosis not present

## 2018-05-30 DIAGNOSIS — S0502XS Injury of conjunctiva and corneal abrasion without foreign body, left eye, sequela: Secondary | ICD-10-CM

## 2018-05-30 DIAGNOSIS — E0590 Thyrotoxicosis, unspecified without thyrotoxic crisis or storm: Secondary | ICD-10-CM | POA: Diagnosis not present

## 2018-05-30 DIAGNOSIS — J410 Simple chronic bronchitis: Secondary | ICD-10-CM | POA: Diagnosis not present

## 2018-05-30 DIAGNOSIS — I10 Essential (primary) hypertension: Secondary | ICD-10-CM

## 2018-05-30 NOTE — Progress Notes (Signed)
Location:    [facility] Room Number: 112/W Place of Service:  SNF [phone]) Provider: Edmon Crape PA-C  Mahlon Gammon, MD  [name], [name]  Extended Emergency Contact Information Primary Emergency Contact: [name] Address: [address]          [place], [place] [address] [place] Home Phone: [phone] Mobile Phone: [phone] Relation: Brother  Code Status:  Full Code Goals of care: Advanced Directive information Advanced Directives 05/30/2018  Does Patient Have a Medical Advance Directive? Yes  Type of Advance Directive (No Data)  Does patient want to make changes to medical advance directive? No - Patient declined  Copy of Healthcare Power of Attorney in Chart? No - copy requested  Would patient like information on creating a medical advance directive? No - Patient declined     Chief Complaint  Patient presents with  . 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 male seen today for medical management of chronic diseases.  As noted above.  Continues to have a period of stability he did have some respiratory issues last month and successfully treated with prednisone-he does have a history of COPD continues on left as well as Symbicort and has duo nebs PRN he is not complaining of cough or congestion today lungs sound appear to be benign.  His blood pressure medications also were slightly adjusted recently because of some higher readings and a cough thought secondary to an ACE inhibitor he is now on Cozaar as well as hydrochlorothiazide and labetalol and this  appears stable systolics largely in the 130s recently I do not see many readings over 140.  Does have a history of CVA with right-sided hemiparesis but does well with supportive care continues to be very motivated ambulating with his wheelchair in the hallway actually most of the day-  At one point he had a recurrent sigmoid volvulus of which actually required hospitalization a couple times- however this has largely resolved MiraLAX appears to be effective he is having regular bowel movements.  Regards to the left corneal abrasion he is followed by ophthalmology and is on routine erythromycin eyedrops.  At one point appeared he had been gaining some weight he does have a good appetite but his weight is actually down about 3 pounds on most recent weight   He does have a history of hyperlipidemia he is on a low dose statin LDL was 58 on May lab.  Currently he is sitting in his wheelchair comfortably in fact he had been out ambulating in the hallway as his usual behavior he has no complaints nursing does not report any issues  Of note in regards to his subclinical hypothyroidism he is followed by endocrinology and he is now actually off the Tapazole follow-up labs are scheduled for approximately 1 to 2 months   Past Medical History:  Diagnosis Date  . Dysphasia   . Fatty liver   . Hemiplegia (HCC)   . Hypertension   . Pneumonia   . Pulmonary embolism (HCC)   . 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(HCC)   . Stroke [facility])    Past Surgical History:  Procedure Laterality Date  .  FLEXIBLE SIGMOIDOSCOPY N/A 10/21/2016   Procedure: FLEXIBLE SIGMOIDOSCOPY;  Surgeon: Malissa Hippo, MD;  [facility];  Service: Endoscopy;  Laterality: N/A;  . FLEXIBLE SIGMOIDOSCOPY N/A 10/25/2016   Procedure: FLEXIBLE SIGMOIDOSCOPY;  Surgeon: Malissa Hippo, MD;  Location: [facility];  Service: Endoscopy;  Laterality: N/A;  . unable      Allergies  Allergen Reactions  . Penicillins     Has patient had a PCN  reaction causing immediate rash, facial/tongue/throat swelling, SOB or lightheadedness with hypotension: unknown Has patient had a PCN reaction causing severe rash involving mucus membranes or skin necrosis: unknown Has patient had a PCN reaction that required hospitalization: unknown Has patient had a PCN reaction occurring within the last 10 years: unknown If all of the above answers are "NO", then may proceed with Cephalosporin use. 5/3 Tolerated Cefepime x 1 dose in ED.     Outpatient Encounter Medications as of 05/30/2018  Medication Sig  . acetaminophen (TYLENOL) 325 MG tablet Take 650 mg by mouth every 6 (six) hours as needed for moderate pain.   Marland Kitchen atorvastatin (LIPITOR) 10 MG tablet Take 10 mg by mouth daily.  . budesonide-formoterol (SYMBICORT) 80-4.5 MCG/ACT inhaler Inhale 2 puffs into the lungs 2 (two) times daily.  . carboxymethylcellulose (REFRESH PLUS) 0.5 % SOLN Place 1 drop into both eyes 3 (three) times daily.   Marland Kitchen erythromycin ophthalmic ointment appy 1 ribbon ophthalmic (eye) 3 times a day  . hydrochlorothiazide (HYDRODIURIL) 12.5 MG tablet Take 12.5 mg by mouth daily.  Marland Kitchen ipratropium (ATROVENT) 0.02 % nebulizer solution Take 0.5 mg by nebulization every 6 (six) hours.  Marland Kitchen labetalol (NORMODYNE) 200 MG tablet Take 200 mg by mouth daily. For HTN  . loratadine (CLARITIN) 10 MG tablet Take 10 mg by mouth daily.  Marland Kitchen losartan (COZAAR) 50 MG tablet Take 50 mg by mouth daily.  . polyethylene glycol (MIRALAX / GLYCOLAX) packet Take 17 g by mouth at bedtime.  . sennosides-docusate sodium (SENOKOT-S) 8.6-50 MG tablet Take 1 tablet by mouth at bedtime. For constipation  . shark liver oil-cocoa butter (HEMORRHOIDAL) 0.25-88.44 % suppository Place 1 suppository rectally at bedtime as needed for hemorrhoids.  Marland Kitchen umeclidinium bromide (INCRUSE ELLIPTA) 62.5 MCG/INH AEPB Inhale 1 puff into the lungs daily.  Marland Kitchen warfarin (COUMADIN) 1 MG tablet Take 1 mg by mouth. Take along with 4 mg to = 4.5 mg on  Tues., Thurs., Sat.  . warfarin (COUMADIN) 4 MG tablet Take 4 mg tablet by mouth once a day on Sun., Mon.,Wed.,Fri.  . warfarin (COUMADIN) 4 MG tablet Take 4 mg by mouth daily. Take along with 1 mg to = 4.5 mg on Tues.,Thurs., Sat.  . [DISCONTINUED] lisinopril (PRINIVIL,ZESTRIL) 10 MG tablet Take 30 mg by mouth daily.    No facility-administered encounter medications on file as of 05/30/2018.      Review of Systems   General is not complaining of any fever chills he is lost a small amount of weight which is a positive development.  Skin is not complain of rashes itching or diaphoresis.  Head ears eyes nose mouth and throat does have a history of left corneal abrasion this appears to be at baseline does not complain of visual changes from baseline.  Respiratory is not complaining of shortness of breath or cough.  Cardiac does not complain of chest pain or increased edema.  GI does not complain of abdominal pain nausea vomiting diarrhea constipation continues to have a good appetite.  GU is not  complaining of dysuria.  Musculoskeletal does have right-sided hemiparesis does not complain of joint pain currently.  Neurologic again does have a history of CVA with right-sided hemiparalysis but is not complaining of dizziness headache or syncope.  And psych does not complain of being depressed or anxious continues to be quite motivated and in good spirits  Immunization History  Administered Date(s) Administered  . Influenza-Unspecified 03/28/2014, 03/23/2016, 03/25/2017  . Pneumococcal Conjugate-13 04/21/2015  . Pneumococcal-Unspecified 11/01/2009, 03/30/2016  . Tdap 03/17/2017   Pertinent  Health Maintenance Due  Topic Date Due  . COLONOSCOPY  10/30/2025 (Originally 11/08/1995)  . INFLUENZA VACCINE  Completed  . PNA vac Low Risk Adult  Completed   Fall Risk  03/27/2018 04/05/2017 03/17/2017  Falls in the past year? No No No  Risk for fall due to : - Impaired balance/gait;Impaired  mobility;Medication side effect;Impaired vision;History of fall(s) -   Functional Status Survey:    Vitals:   05/30/18 1424  BP: 133/80  Pulse: 74  Resp: 20  Temp: (!) 97.2 F (36.2 C)  TempSrc: Oral  SpO2: 91%  Weight: 205 lb 8 oz (93.2 kg)  Height: 5' 8.5" (1.74 m)   Body mass index is 30.79 kg/m. Physical Exam   In general this is a very pleasant elderly male in no distress sitting comfortably in his wheelchair.  His skin is warm and dry.  Eyes right eye sclera and conjunctive are clear visual acuity appears to be intact he does have opacity of his left eye baseline.  Oropharynx is clear mucous membranes moist.  Chest is clear to auscultation there is somewhat shallow air entry there is no labored breathing.  Heart is regular rate and rhythm without murmur gallop or rub he has chronic venous stasis edema at baseline.  Abdomen is somewhat obese soft nontender with positive bowel sounds.  Musculoskeletal does have right-sided hemiparesis at baseline with some contracture of his right hand he has good strength on his left upper and lower extremities and this is how he ambulates to the hallway.  Neurologic as noted above his speech is somewhat slurred which is not new.  Psych he is alert and oriented very pleasant and appropriate  Labs reviewed: Recent Labs    04/20/18 0709 05/04/18 0700 05/26/18 0700  NA 139 138 140  K 4.3 4.0 4.0  CL 103 102 102  CO2 27 28 30   GLUCOSE 78 85 81  BUN 24* 31* 24*  CREATININE 1.14 1.08 1.22  CALCIUM 8.7* 8.6* 8.9   Recent Labs    07/19/17 0709 05/04/18 0700  AST 25 24  ALT 29 33  ALKPHOS 79 57  BILITOT 0.7 0.8  PROT 7.3 7.3  ALBUMIN 3.3* 3.4*   Recent Labs    07/19/17 0709 11/16/17 0709 01/23/18 0315 04/20/18 0709  WBC 7.5 8.2 10.3 6.7  NEUTROABS 4.1  --   --  3.8  HGB 15.5 15.9 14.4 16.0  HCT 48.6 48.5 44.4 49.7  MCV 93.8 95.3 96.5 96.9  PLT 297 269 281 246   Lab Results  Component Value Date   TSH 3.326  04/24/2018   Lab Results  Component Value Date   HGBA1C 6.0 04/26/2009   Lab Results  Component Value Date   CHOL 107 05/04/2018   HDL 43 05/04/2018   LDLCALC 54 05/04/2018   TRIG 50 05/04/2018   CHOLHDL 2.5 05/04/2018    Significant Diagnostic Results in last 30 days:  No results found.  Assessment/Plan  #1 history of  hypertension as noted above this appears to be improved on current medications including losartan low-dose hydrochlorothiazide and labetalol at this point continue to monitor appears baseline systolics in the 130s.  2.  COPD this appears stabilized he did get treated with prednisone last month and responded well to this he is on Ellipta as well as Symbicort and has duo nebs PRN.  3.  History of pulmonary embolism and DVT he is on chronic Coumadin INR is therapeutic at 2.13 on lab done on December 5 update INR is pending.  4.  History of CVA with right-sided hemiparesis again he does quite well with supportive care continues to be motivated to get out in the hallway and exercise- he is on Coumadin again INR therapeutic.  5.  History of subclinical hyperthyroidism he is followed by Dr. Fransico HimNida endocrinology he is now off the Tapazole last lab work showed a normal TSH and T4 update labs are pending per endocrinology.  6 history of recurrent sigmoid volvulus this is been now stable for an extended period of time on the MiraLAX.  7.  History of weight gain this appears to have moderated he is actually lost about 3 pounds which is encouraging.  8.  History of left corneal abrasion he is followed by ophthalmology he is now on erythromycin routinely per ophthalmology recommendation and consult.  9.  History of hyperlipidemia this appears well controlled on low-dose Lipitor LDL was 58 back in May.  I do note he has an updated BMP next week to follow-up on his renal function creatinine 1.22 appears relatively baseline possibly slightly on the upper end he is now on  hydrochlorothiazide will await update labs with this appears to be stable  [id number]

## 2018-06-02 ENCOUNTER — Encounter (HOSPITAL_COMMUNITY)
Admission: RE | Admit: 2018-06-02 | Discharge: 2018-06-02 | Disposition: A | Discharge status: Home / Self Care | Payer: Medicare Other | Source: Skilled Nursing Facility | Attending: Internal Medicine | Admitting: Internal Medicine

## 2018-06-02 DIAGNOSIS — I10 Essential (primary) hypertension: Secondary | ICD-10-CM | POA: Insufficient documentation

## 2018-06-02 DIAGNOSIS — I69328 Other speech and language deficits following cerebral infarction: Secondary | ICD-10-CM | POA: Diagnosis not present

## 2018-06-02 DIAGNOSIS — M6281 Muscle weakness (generalized): Secondary | ICD-10-CM | POA: Diagnosis not present

## 2018-06-02 LAB — BASIC METABOLIC PANEL
ANION GAP: 7 (ref 5–15)
BUN: 25 mg/dL — ABNORMAL HIGH (ref 8–23)
CALCIUM: 8.7 mg/dL — AB (ref 8.9–10.3)
CO2: 29 mmol/L (ref 22–32)
Chloride: 101 mmol/L (ref 98–111)
Creatinine, Ser: 1.17 mg/dL (ref 0.61–1.24)
GFR calc Af Amer: >60 mL/min (ref >60)
GLUCOSE: 88 mg/dL (ref 70–99)
Potassium: 4 mmol/L (ref 3.5–5.1)
Sodium: 137 mmol/L (ref 135–145)

## 2018-06-15 ENCOUNTER — Encounter (HOSPITAL_COMMUNITY)
Admission: RE | Admit: 2018-06-15 | Discharge: 2018-06-15 | Disposition: A | Discharge status: Home / Self Care | Payer: Medicare Other | Source: Skilled Nursing Facility | Attending: Internal Medicine | Admitting: Internal Medicine

## 2018-06-15 DIAGNOSIS — I10 Essential (primary) hypertension: Secondary | ICD-10-CM | POA: Insufficient documentation

## 2018-06-15 DIAGNOSIS — R1312 Dysphagia, oropharyngeal phase: Secondary | ICD-10-CM | POA: Diagnosis not present

## 2018-06-15 DIAGNOSIS — K562 Volvulus: Secondary | ICD-10-CM | POA: Diagnosis not present

## 2018-06-15 DIAGNOSIS — M6281 Muscle weakness (generalized): Secondary | ICD-10-CM | POA: Insufficient documentation

## 2018-06-15 DIAGNOSIS — I69328 Other speech and language deficits following cerebral infarction: Secondary | ICD-10-CM | POA: Insufficient documentation

## 2018-06-15 LAB — PROTIME-INR
INR: 2.06
PROTHROMBIN TIME: 23 s — AB (ref 11.4–15.2)

## 2018-07-06 ENCOUNTER — Encounter (HOSPITAL_COMMUNITY)
Admission: RE | Admit: 2018-07-06 | Discharge: 2018-07-06 | Disposition: A | Discharge status: Home / Self Care | Payer: Medicare Other | Source: Skilled Nursing Facility | Attending: Internal Medicine | Admitting: Internal Medicine

## 2018-07-06 DIAGNOSIS — I10 Essential (primary) hypertension: Secondary | ICD-10-CM | POA: Insufficient documentation

## 2018-07-06 DIAGNOSIS — I69328 Other speech and language deficits following cerebral infarction: Secondary | ICD-10-CM | POA: Insufficient documentation

## 2018-07-06 DIAGNOSIS — K562 Volvulus: Secondary | ICD-10-CM | POA: Diagnosis not present

## 2018-07-06 DIAGNOSIS — R1312 Dysphagia, oropharyngeal phase: Secondary | ICD-10-CM | POA: Insufficient documentation

## 2018-07-06 DIAGNOSIS — M6281 Muscle weakness (generalized): Secondary | ICD-10-CM | POA: Insufficient documentation

## 2018-07-06 LAB — PROTIME-INR
INR: 2.11
Prothrombin Time: 23.3 seconds — ABNORMAL HIGH (ref 11.4–15.2)

## 2018-07-20 ENCOUNTER — Encounter: Payer: Self-pay | Admitting: Internal Medicine

## 2018-07-20 ENCOUNTER — Encounter (HOSPITAL_COMMUNITY)
Admission: RE | Admit: 2018-07-20 | Discharge: 2018-07-20 | Disposition: A | Discharge status: Home / Self Care | Payer: Medicare Other | Source: Skilled Nursing Facility | Attending: Internal Medicine | Admitting: Internal Medicine

## 2018-07-20 ENCOUNTER — Non-Acute Institutional Stay (SKILLED_NURSING_FACILITY): Payer: Medicare Other | Admitting: Internal Medicine

## 2018-07-20 DIAGNOSIS — I2782 Chronic pulmonary embolism: Secondary | ICD-10-CM

## 2018-07-20 DIAGNOSIS — I69328 Other speech and language deficits following cerebral infarction: Secondary | ICD-10-CM | POA: Insufficient documentation

## 2018-07-20 DIAGNOSIS — I10 Essential (primary) hypertension: Secondary | ICD-10-CM | POA: Diagnosis not present

## 2018-07-20 DIAGNOSIS — I639 Cerebral infarction, unspecified: Secondary | ICD-10-CM

## 2018-07-20 DIAGNOSIS — J410 Simple chronic bronchitis: Secondary | ICD-10-CM

## 2018-07-20 DIAGNOSIS — Z7901 Long term (current) use of anticoagulants: Secondary | ICD-10-CM | POA: Diagnosis not present

## 2018-07-20 DIAGNOSIS — K562 Volvulus: Secondary | ICD-10-CM | POA: Diagnosis not present

## 2018-07-20 DIAGNOSIS — M6281 Muscle weakness (generalized): Secondary | ICD-10-CM | POA: Diagnosis not present

## 2018-07-20 LAB — PROTIME-INR
INR: 2.48
PROTHROMBIN TIME: 26.5 s — AB (ref 11.4–15.2)

## 2018-07-20 NOTE — Progress Notes (Signed)
Location:    [facility] Room Number: 112/W Place of Service:  SNF [phone]) Provider: Edmon CrapeArlo Renji Berwick PA-C  Mahlon GammonGupta, Anjali L, MD  Patient Care Team: Mahlon GammonGupta, Anjali L, [name]  Extended Emergency Contact Information Primary Emergency Contact: Venetia MaxonWalker,[name] Jr Address: [address]          [place], [place] [address] [place] Home Phone: [phone] Mobile Phone: [phone] Relation: Brother  Code Status:  Full Code Goals of care: Advanced Directive information Advanced Directives 07/20/2018  Does Patient Have a Medical Advance Directive? Yes  Type of Advance Directive (No Data)  Does patient want to make changes to medical advance directive? No - Patient declined  Copy of Healthcare Power of Attorney in Chart? No - copy requested  Would patient like information on creating a medical advance directive? No - Patient declined     Chief Complaint  Patient presents with  . 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    HPI:  Pt is a 73 y.o. male seen today for medical management of chronic diseases.  As noted above.  Patient continues to be stable with no acute complaints.  He has gained a small amount of weight about 2 pounds since November- he does have a good appetite.  Vital signs appear stable he does have a history of hypertension blood pressure today is 126/80 has variable blood pressure readings previously ranging from 103/72 up to 152/90 I do not see consistent elevations systolically over 140 he is on hydrochlorothiazide 12.5 mg a day as well as labetalol 200 mg a day- as well as Cozaar 50 mg a day   Patient does have a  history of recurrent sigmoid volvulus which was quite an issue previously with hospitalization but this is been stable now on MiraLAX as well as Senokot he is having regular bowel movements.  He does not complain of any abdominal pain.  He also has a history of CVA with right-sided hemiparesis which is baseline he does well with supportive care continues to be very motivated ambulating about facility in his wheelchair using his left-sided extremities.  He also has a previous history of pulmonary embolism and DVTs on chronic Coumadin INR has been therapeutic now for quite a while today it is 2.48 at this point will continue current dose and recheck in about a month secondary to stability.  --- He also has a history of subclinical hyperthyroidism which is followed by endocrinology--he is off Tapazole recent T4 and TSH back in November were within normal range this is followed by endocrinology  He also has a history of COPD has orders for DuoNeb's and Incruse Ellipta and this has been stable has some chronic bronchial sounds but this appears to be baseline of note he is also on Symbicort.   He also has a history of a corneal abrasion is followed by ophthalmology and is on routine erythromycin eyedrops as well as Refresh Tears.  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  Currently he is lying in bed comfortably has no acute complaints says at times he feels a bit hoarse but otherwise has no complaints  Past Medical History:  Diagnosis Date  . Dysphasia   . Fatty  liver   . Hemiplegia (HCC)   . Hypertension   . Pneumonia   . Pulmonary embolism (HCC)   . 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(HCC)   . Stroke [facility])    Past Surgical History:  Procedure Laterality Date  . FLEXIBLE SIGMOIDOSCOPY N/A 10/21/2016   Procedure: FLEXIBLE SIGMOIDOSCOPY;  Surgeon: Malissa Hippo, MD;  [facility]: [facility];  Service: Endoscopy;  Laterality: N/A;  . FLEXIBLE SIGMOIDOSCOPY N/A 10/25/2016   Procedure: FLEXIBLE  SIGMOIDOSCOPY;  Surgeon: Malissa Hippo, MD;  Location: [facility];  Service: Endoscopy;  Laterality: N/A;  . unable      Allergies  Allergen Reactions  . Penicillins     Has patient had a PCN reaction causing immediate rash, facial/tongue/throat swelling, SOB or lightheadedness with hypotension: unknown Has patient had a PCN reaction causing severe rash involving mucus membranes or skin necrosis: unknown Has patient had a PCN reaction that required hospitalization: unknown Has patient had a PCN reaction occurring within the last 10 years: unknown If all of the above answers are "NO", then may proceed with Cephalosporin use. 5/3 Tolerated Cefepime x 1 dose in ED.     Outpatient Encounter Medications as of 07/20/2018  Medication Sig  . acetaminophen (TYLENOL) 325 MG tablet Take 650 mg by mouth every 6 (six) hours as needed for moderate pain.   Marland Kitchen atorvastatin (LIPITOR) 10 MG tablet Take 10 mg by mouth daily.  . budesonide-formoterol (SYMBICORT) 80-4.5 MCG/ACT inhaler Inhale 2 puffs into the lungs 2 (two) times daily.  . carboxymethylcellulose (REFRESH PLUS) 0.5 % SOLN Place 1 drop into the right eye 3 (three) times daily.   Marland Kitchen erythromycin ophthalmic ointment Place into the left eye. appy 1 ribbon ophthalmic (eye) 3 times a day  . hydrochlorothiazide (HYDRODIURIL) 12.5 MG tablet Take 12.5 mg by mouth daily.  Marland Kitchen ipratropium (ATROVENT) 0.02 % nebulizer solution Take 0.5 mg by nebulization every 6 (six) hours.  Marland Kitchen labetalol (NORMODYNE) 200 MG tablet Take 200 mg by mouth daily. For HTN  . loratadine (CLARITIN) 10 MG tablet Take 10 mg by mouth daily.  Marland Kitchen losartan (COZAAR) 50 MG tablet Take 50 mg by mouth daily.  . polyethylene glycol (MIRALAX / GLYCOLAX) packet Take 17 g by mouth at bedtime.  . sennosides-docusate sodium (SENOKOT-S) 8.6-50 MG tablet Take 1 tablet by mouth at bedtime. For constipation  . shark liver oil-cocoa butter (HEMORRHOIDAL) 0.25-88.44 % suppository Place 1 suppository  rectally at bedtime as needed for hemorrhoids.  Marland Kitchen umeclidinium bromide (INCRUSE ELLIPTA) 62.5 MCG/INH AEPB Inhale 1 puff into the lungs daily.  Marland Kitchen warfarin (COUMADIN) 1 MG tablet Take 1 mg by mouth. Take along with 4 mg to = 4.5 mg on Tues., Thurs., Sat.  . warfarin (COUMADIN) 4 MG tablet Take 4 mg tablet by mouth once a day on Sun., Mon.,Wed.,Fri.  . warfarin (COUMADIN) 4 MG tablet Take 4 mg by mouth daily. Take along with 1 mg to = 4.5 mg on Tues.,Thurs., Sat.   No facility-administered encounter medications on file as of 07/20/2018.      Review of Systems   General is not complaining of fever chills he lost a small amount of a last month but appears to have gained it back.  Skin is not complaining of rashes or itching.  Head ears eyes nose mouth and throat is not complaining of sore throat or visual changes beyond baseline he has a left corneal abrasion and does not have vision in his left eye.  Does complain of hoarseness  at times  Respiratory is not complaining of shortness of breath or cough he does have a history of COPD.  Cardiac is not complain of chest pain or increased edema from baseline.  GI does not complain of abdominal pain nausea vomiting diarrhea constipation does eat quite well and again has gained some weight again.  GU does not complain of dysuria.  Musculoskeletal continues with right-sided hemiparesis is not complaining of joint pain.  Neurologic has a history of CVA with right-sided hemiparesis does not complain of dizziness headache or syncope.  And psych does not complain of being depressed or anxious continues to be motivated ambulating about facility in his wheelchair  Immunization History  Administered Date(s) Administered  . Influenza-Unspecified 03/28/2014, 03/23/2016, 03/25/2017  . Pneumococcal Conjugate-13 04/21/2015  . Pneumococcal-Unspecified 11/01/2009, 03/30/2016  . Tdap 03/17/2017   Pertinent  Health Maintenance Due  Topic Date Due  .  COLONOSCOPY  10/30/2025 (Originally 11/08/1995)  . INFLUENZA VACCINE  Completed  . PNA vac Low Risk Adult  Completed   Fall Risk  03/27/2018 04/05/2017 03/17/2017  Falls in the past year? No No No  Risk for fall due to : - Impaired balance/gait;Impaired mobility;Medication side effect;Impaired vision;History of fall(s) -   Functional Status Survey:    Vitals:   07/20/18 1649  BP: 126/80  Pulse: 83  Resp: 20  Temp: 97.9 F (36.6 C)  TempSrc: Oral  SpO2: 91%  Weight: 210 lb 12.8 oz (95.6 kg)  Height: 5' 8.5" (1.74 m)   Body mass index is 31.59 kg/m. Physical Exam   In general this is a pleasant elderly male in no distress resting comfortably in bed.  His skin is warm and dry.  Eyes visual acuity right eye appears to be intact does have opacity of his left eye which is baseline.  Oropharynx is clear mucous membranes moist.  Chest is clear to auscultation with some bronchial sounds that clear with cough.  Heart is regular rate and rhythm without murmur gallop or rub he has chronic venous stasis edema which appears to be baseline.  Abdomen is obese soft nontender with positive bowel sounds.  Musculoskeletal continues with right-sided hemiparesis contracture of his right hand at baseline has good strength in his upper extremity as well as lower extremity on the left.  Neurologic as noted above his speech continues to be somewhat slurred at baseline.  Psych he is alert and oriented pleasant and appropriate    Labs reviewed: Recent Labs    05/04/18 0700 05/26/18 0700 06/02/18 0700  NA 138 140 137  K 4.0 4.0 4.0  CL 102 102 101  CO2 28 30 29   GLUCOSE 85 81 88  BUN 31* 24* 25*  CREATININE 1.08 1.22 1.17  CALCIUM 8.6* 8.9 8.7*   Recent Labs    05/04/18 0700  AST 24  ALT 33  ALKPHOS 57  BILITOT 0.8  PROT 7.3  ALBUMIN 3.4*   Recent Labs    11/16/17 0709 01/23/18 0315 04/20/18 0709  WBC 8.2 10.3 6.7  NEUTROABS  --   --  3.8  HGB 15.9 14.4 16.0  HCT  48.5 44.4 49.7  MCV 95.3 96.5 96.9  PLT 269 281 246   Lab Results  Component Value Date   TSH 3.326 04/24/2018   Lab Results  Component Value Date   HGBA1C 6.0 04/26/2009   Lab Results  Component Value Date   CHOL 107 05/04/2018   HDL 43 05/04/2018   LDLCALC 54 05/04/2018   TRIG  50 05/04/2018   CHOLHDL 2.5 05/04/2018    Significant Diagnostic Results in last 30 days:  No results found.  Assessment/Plan  #1 history of hypertension this appears to be relatively well controlled with systolics mainly under 140 he is on losartan as well as hydrochlorothiazide and labetalol.  2.  COPD appears stable on current treatments including Incruse Ellipta Symbicort and duo nebs as needed times he has required prednisone.  3.  History of pulmonary embolism and DVT continues on chronic Coumadin as noted above INR continues to be therapeutic will recheck this in about a month.  4.  History of CVA with right-sided hemiparesis he is doing well with supportive care continues to ambulate in the hallway using his left-sided extremities.  5.  History of subclinical thyroidism he is followed by endocrinology appears updated labs are doing about 2 weeks-she is off Tapazole T4 and TSH in November were within normal limits and evaluated by endocrinology.    #6 history of recurrent sigmoid volvulus this is stable on MiraLAX as well as Senokot.  7.  History of left corneal abrasion continues on routine erythromycin he is followed by ophthalmology.  8.  History of hyperlipidemia LDL was 54 on lab done in November he is on low-dose Lipitor  -#9 history of weight gain-he appears to be gaining back some weight has a very good appetite will speak to him about portion control--- although I suspect this will continue to be a challenge   [id number]

## 2018-07-27 ENCOUNTER — Encounter (HOSPITAL_COMMUNITY)
Admission: RE | Admit: 2018-07-27 | Discharge: 2018-07-27 | Disposition: A | Discharge status: Home / Self Care | Payer: Medicare Other | Source: Skilled Nursing Facility | Attending: Internal Medicine | Admitting: Internal Medicine

## 2018-07-27 ENCOUNTER — Ambulatory Visit: Payer: Medicare Other | Admitting: "Endocrinology

## 2018-07-27 DIAGNOSIS — I69328 Other speech and language deficits following cerebral infarction: Secondary | ICD-10-CM | POA: Insufficient documentation

## 2018-07-27 DIAGNOSIS — I10 Essential (primary) hypertension: Secondary | ICD-10-CM | POA: Insufficient documentation

## 2018-07-27 DIAGNOSIS — M6281 Muscle weakness (generalized): Secondary | ICD-10-CM | POA: Insufficient documentation

## 2018-07-28 ENCOUNTER — Encounter (HOSPITAL_COMMUNITY)
Admission: RE | Admit: 2018-07-28 | Discharge: 2018-07-28 | Disposition: A | Discharge status: Home / Self Care | Payer: Medicare Other | Source: Skilled Nursing Facility | Attending: Internal Medicine | Admitting: Internal Medicine

## 2018-07-28 DIAGNOSIS — H179 Unspecified corneal scar and opacity: Secondary | ICD-10-CM | POA: Insufficient documentation

## 2018-07-28 DIAGNOSIS — M6281 Muscle weakness (generalized): Secondary | ICD-10-CM | POA: Insufficient documentation

## 2018-07-28 DIAGNOSIS — E785 Hyperlipidemia, unspecified: Secondary | ICD-10-CM | POA: Insufficient documentation

## 2018-07-28 DIAGNOSIS — K649 Unspecified hemorrhoids: Secondary | ICD-10-CM | POA: Insufficient documentation

## 2018-07-28 DIAGNOSIS — I10 Essential (primary) hypertension: Secondary | ICD-10-CM | POA: Diagnosis not present

## 2018-07-28 DIAGNOSIS — R0981 Nasal congestion: Secondary | ICD-10-CM | POA: Insufficient documentation

## 2018-07-28 DIAGNOSIS — K5900 Constipation, unspecified: Secondary | ICD-10-CM | POA: Diagnosis not present

## 2018-07-28 DIAGNOSIS — R1312 Dysphagia, oropharyngeal phase: Secondary | ICD-10-CM | POA: Diagnosis not present

## 2018-07-28 LAB — PROTIME-INR
INR: 2.2
PROTHROMBIN TIME: 24.1 s — AB (ref 11.4–15.2)

## 2018-07-28 LAB — T4, FREE: Free T4: 0.92 ng/dL (ref 0.82–1.77)

## 2018-07-28 LAB — TSH: TSH: 0.299 u[IU]/mL — ABNORMAL LOW (ref 0.350–4.500)

## 2018-07-29 LAB — T4: T4 TOTAL: 7.9 ug/dL (ref 4.5–12.0)

## 2018-07-29 LAB — T3, FREE: T3 FREE: 2.8 pg/mL (ref 2.0–4.4)

## 2018-08-04 ENCOUNTER — Encounter: Payer: Self-pay | Admitting: "Endocrinology

## 2018-08-04 ENCOUNTER — Ambulatory Visit (INDEPENDENT_AMBULATORY_CARE_PROVIDER_SITE_OTHER): Payer: Medicare Other | Admitting: "Endocrinology

## 2018-08-04 VITALS — Ht 68.5 in | Wt 210.0 lb

## 2018-08-04 DIAGNOSIS — E0590 Thyrotoxicosis, unspecified without thyrotoxic crisis or storm: Secondary | ICD-10-CM | POA: Diagnosis not present

## 2018-08-04 DIAGNOSIS — I639 Cerebral infarction, unspecified: Secondary | ICD-10-CM | POA: Diagnosis not present

## 2018-08-04 NOTE — Progress Notes (Signed)
Endocrinology follow-up note   Subjective:    Patient ID:  [name] [name], male    DOB: Nov 18, 1945, PCP Mahlon Gammon, MD   Past Medical History:  Diagnosis Date  . Dysphasia   . Fatty liver   . Hemiplegia (HCC)   . Hypertension   . Pneumonia   . Pulmonary embolism (HCC)   . 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(HCC)   . Stroke [facility])    Past Surgical History:  Procedure Laterality Date  . FLEXIBLE SIGMOIDOSCOPY N/A 10/21/2016   Procedure: FLEXIBLE SIGMOIDOSCOPY;  Surgeon: Malissa Hippo, MD;  [facility];  Service: Endoscopy;  Laterality: N/A;  . FLEXIBLE SIGMOIDOSCOPY N/A 10/25/2016   Procedure: FLEXIBLE SIGMOIDOSCOPY;  Surgeon: Malissa Hippo, MD;  [facility];  Service: Endoscopy;  Laterality: N/A;  . unable     Social History   Socioeconomic History  . Marital status: Single    Spouse name: Not on file  . Number of children: Not on file  . Years of education: Not on file  . Highest education level: Not on file  Occupational History  . Not on file  Social Needs  . Financial resource strain: Not hard at all  . Food insecurity:    Worry: Never true    Inability: Never true  . Transportation needs:    Medical: No    Non-medical: No  Tobacco Use  . Smoking status: Never Smoker  . Smokeless tobacco: Never Used  Substance and Sexual Activity  . Alcohol use: No    Alcohol/week: 0.0 standard drinks  . Drug use: No  . Sexual activity: Not on file  Lifestyle  . Physical activity:    Days per week: 0 days    Minutes per session: 0 min  . Stress: Not at all  Relationships  . Social connections:    Talks on phone: Never    Gets together: Once a week    Attends religious service: Never    Active member of club or organization: No    Attends meetings of clubs or organizations: Never    Relationship status: Never married  Other Topics Concern  . Not on file  Social History Narrative  . Not on file   Outpatient Encounter Medications as of 08/04/2018   Medication Sig  . acetaminophen (TYLENOL) 325 MG tablet Take 650 mg by mouth every 6 (six) hours as needed for moderate pain.   Marland Kitchen atorvastatin (LIPITOR) 10 MG tablet Take 10 mg by mouth daily.  . budesonide-formoterol (SYMBICORT) 80-4.5 MCG/ACT inhaler Inhale 2 puffs into the lungs 2 (two) times daily.  . carboxymethylcellulose (REFRESH PLUS) 0.5 % SOLN Place 1 drop into the right eye 3 (three) times daily.   Marland Kitchen erythromycin ophthalmic ointment Place into the left eye. appy 1 ribbon ophthalmic (eye) 3 times a day  . hydrochlorothiazide (HYDRODIURIL) 12.5 MG tablet Take 12.5 mg by mouth daily.  Marland Kitchen ipratropium (ATROVENT) 0.02 % nebulizer solution Take 0.5 mg by nebulization every 6 (six) hours.  Marland Kitchen labetalol (NORMODYNE) 200 MG tablet Take 200 mg by mouth daily. For HTN  . loratadine (CLARITIN) 10 MG tablet Take 10 mg by mouth daily.  Marland Kitchen losartan (COZAAR) 50 MG tablet Take 50 mg by mouth daily.  . polyethylene glycol (MIRALAX / GLYCOLAX) packet Take 17 g by mouth at bedtime.  . sennosides-docusate sodium (SENOKOT-S) 8.6-50 MG tablet Take 1 tablet by mouth at bedtime. For constipation  . shark liver oil-cocoa butter (HEMORRHOIDAL) 0.25-88.44 % suppository Place  1 suppository rectally at bedtime as needed for hemorrhoids.  Marland Kitchen umeclidinium bromide (INCRUSE ELLIPTA) 62.5 MCG/INH AEPB Inhale 1 puff into the lungs daily.  Marland Kitchen warfarin (COUMADIN) 1 MG tablet Take 1 mg by mouth. Take along with 4 mg to = 4.5 mg on Tues., Thurs., Sat.  . warfarin (COUMADIN) 4 MG tablet Take 4 mg tablet by mouth once a day on Sun., Mon.,Wed.,Fri.  . warfarin (COUMADIN) 4 MG tablet Take 4 mg by mouth daily. Take along with 1 mg to = 4.5 mg on Tues.,Thurs., Sat.   No facility-administered encounter medications on file as of 08/04/2018.    ALLERGIES: Allergies  Allergen Reactions  . Penicillins     Has patient had a PCN reaction causing immediate rash, facial/tongue/throat swelling, SOB or lightheadedness with hypotension:  unknown Has patient had a PCN reaction causing severe rash involving mucus membranes or skin necrosis: unknown Has patient had a PCN reaction that required hospitalization: unknown Has patient had a PCN reaction occurring within the last 10 years: unknown If all of the above answers are "NO", then may proceed with Cephalosporin use. 5/3 Tolerated Cefepime x 1 dose in ED.     VACCINATION STATUS: Immunization History  Administered Date(s) Administered  . Influenza-Unspecified 03/28/2014, 03/23/2016, 03/25/2017  . Pneumococcal Conjugate-13 04/21/2015  . Pneumococcal-Unspecified 11/01/2009, 03/30/2016  . Tdap 03/17/2017    HPI [name] [name] is 73 y.o. male who presents today with a medical history as above. - He has had hyperthyroidism which required intervention with methimazole until his last visit.  He was taken off of methimazole due to adequate response, returns with repeat thyroid function tests.  -  He has multiple medical problems including stroke with right-sided hemiplegia, nursing home resident. - He also has dysphagia, was found to be losing weight, currently reversing. - He was sent for thyroid uptake and scan which showed nonfocal elevated uptake of 42%.  He is not accompanied by his brother today.  He has no new complaints today. 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   Review of Systems  Constitutional: + steady gain,  - fatigue, no subjective hyperthermia, no subjective hypothermia, + wheelchair-bound.  Eyes: no blurry vision, no xerophthalmia ENT: no sore throat, no nodules palpated in throat, no dysphagia/odynophagia, no hoarseness  Musculoskeletal: + Wheelchair-bound ,  stiff muscles on extremities.  Skin: no rashes Neurological: no tremors, no numbness, no tingling, no dizziness Psychiatric: + depression, no anxiety  Objective:    Ht 5' 8.5" (1.74 m)    Wt 210 lb (95.3 kg)   BMI 31.47 kg/m   Wt Readings from Last 3 Encounters:  08/04/18 210 lb (95.3 kg)  07/20/18 210 lb 12.8 oz (95.6 kg)  05/30/18 205 lb 8 oz (93.2 kg)    Physical Exam  Constitutional: Not in acute distress,  + recliner/wheelchair-bound, with stiff and spastic right upper and lower extremities Eyes: PERRLA, EOMI, no exophthalmos, + drooling ENT: moist mucous membranes, no thyromegaly, no cervical lymphadenopathy CVS: Regular rate and rhythm Musculoskeletal: +  Wheelchair-bound, with stiff and spastic right upper and lower extremities Skin: moist, warm, no rashes Neurological: No tremors on outstretched hands   CMP ( most recent) CMP     Component Value Date/Time   NA 137 06/02/2018 0700   NA 142 09/12/2015   K 4.0 06/02/2018 0700   CL 101 06/02/2018 0700   CO2 29 06/02/2018 0700  GLUCOSE 88 06/02/2018 0700   BUN 25 (H) 06/02/2018 0700   BUN 18 09/12/2015   CREATININE 1.17 06/02/2018 0700   CALCIUM 8.7 (L) 06/02/2018 0700   PROT 7.3 05/04/2018 0700   ALBUMIN 3.4 (L) 05/04/2018 0700   AST 24 05/04/2018 0700   ALT 33 05/04/2018 0700   ALKPHOS 57 05/04/2018 0700   BILITOT 0.8 05/04/2018 0700   GFRNONAA >60 06/02/2018 0700   GFRAA >60 06/02/2018 0700     Diabetic Labs (most recent): Lab Results  Component Value Date   HGBA1C 6.0 04/26/2009   HGBA1C  03/27/2009    6.0 (NOTE) The ADA recommends the following therapeutic goal for glycemic control related to Hgb A1c measurement: Goal of therapy: <6.5 Hgb A1c  Reference: American Diabetes Association: Clinical Practice Recommendations 2010, Diabetes Care, 2010, 33: (Suppl  1).   HGBA1C 6 06/22/2008     Lipid Panel ( most recent) Lipid Panel     Component Value Date/Time   CHOL 107 05/04/2018 0700   TRIG 50 05/04/2018 0700   HDL 43 05/04/2018 0700   CHOLHDL 2.5 05/04/2018 0700   VLDL 10 05/04/2018 0700   LDLCALC 54 05/04/2018 0700    Results for EUSTAQUIO, BOLOTIN (MRN [id number]) as of  08/04/2018 10:51  Ref. Range 07/28/2018 07:15  TSH Latest Ref Range: 0.350 - 4.500 uIU/mL 0.299 (L)  Triiodothyronine,Free,Serum Latest Ref Range: 2.0 - 4.4 pg/mL 2.8  T4,Free(Direct) Latest Ref Range: 0.82 - 1.77 ng/dL 2.44  Thyroxine (T4) Latest Ref Range: 4.5 - 12.0 ug/dL 7.9     Assessment & Plan:   1.  Hyperthyroidism -His previsit labs are consistent with treatment effect, slightly suppressed TSH.  He will not require antithyroid intervention at this time.  He is advised to return in 4 months with repeat thyroid function tests. -He is on labetalol 200 mg per daily related to his high blood pressure.  - I advised patient to maintain close follow up with Mahlon Gammon, MD for  primary care needs.  Follow up plan: Return in about 4 months (around 12/03/2018) for Follow up with Pre-visit Labs.  Marquis Lunch, MD [facility] Phone: [phone]  Fax: [phone]   08/04/2018, 12:02 PM This note was partially dictated with voice recognition software. Similar sounding words can be transcribed inadequately or may not  be corrected upon review.

## 2018-08-17 ENCOUNTER — Encounter (HOSPITAL_COMMUNITY)
Admission: RE | Admit: 2018-08-17 | Discharge: 2018-08-17 | Disposition: A | Discharge status: Home / Self Care | Payer: Medicare Other | Source: Skilled Nursing Facility | Attending: Internal Medicine | Admitting: Internal Medicine

## 2018-08-17 ENCOUNTER — Encounter: Payer: Self-pay | Admitting: Adult Health

## 2018-08-17 ENCOUNTER — Non-Acute Institutional Stay (SKILLED_NURSING_FACILITY): Payer: Medicare Other | Admitting: Adult Health

## 2018-08-17 DIAGNOSIS — I10 Essential (primary) hypertension: Secondary | ICD-10-CM

## 2018-08-17 DIAGNOSIS — I6389 Other cerebral infarction: Secondary | ICD-10-CM | POA: Diagnosis not present

## 2018-08-17 DIAGNOSIS — I82502 Chronic embolism and thrombosis of unspecified deep veins of left lower extremity: Secondary | ICD-10-CM | POA: Diagnosis not present

## 2018-08-17 DIAGNOSIS — E785 Hyperlipidemia, unspecified: Secondary | ICD-10-CM

## 2018-08-17 DIAGNOSIS — G8191 Hemiplegia, unspecified affecting right dominant side: Secondary | ICD-10-CM

## 2018-08-17 DIAGNOSIS — E0590 Thyrotoxicosis, unspecified without thyrotoxic crisis or storm: Secondary | ICD-10-CM | POA: Diagnosis not present

## 2018-08-17 DIAGNOSIS — H179 Unspecified corneal scar and opacity: Secondary | ICD-10-CM | POA: Diagnosis not present

## 2018-08-17 DIAGNOSIS — I2782 Chronic pulmonary embolism: Secondary | ICD-10-CM

## 2018-08-17 DIAGNOSIS — I69391 Dysphagia following cerebral infarction: Secondary | ICD-10-CM

## 2018-08-17 DIAGNOSIS — K562 Volvulus: Secondary | ICD-10-CM | POA: Diagnosis not present

## 2018-08-17 DIAGNOSIS — J410 Simple chronic bronchitis: Secondary | ICD-10-CM | POA: Diagnosis not present

## 2018-08-17 DIAGNOSIS — I679 Cerebrovascular disease, unspecified: Secondary | ICD-10-CM

## 2018-08-17 LAB — PROTIME-INR
INR: 2.5 — ABNORMAL HIGH (ref 0.8–1.2)
Prothrombin Time: 26.9 seconds — ABNORMAL HIGH (ref 11.4–15.2)

## 2018-08-17 NOTE — Progress Notes (Signed)
Location:   [facility] Room Number: 63 W Place of Service:  SNF ([id number])   CODE STATUS: Full Code  Allergies  Allergen Reactions  . Penicillins     Has patient had a PCN reaction causing immediate rash, facial/tongue/throat swelling, SOB or lightheadedness with hypotension: unknown Has patient had a PCN reaction causing severe rash involving mucus membranes or skin necrosis: unknown Has patient had a PCN reaction that required hospitalization: unknown Has patient had a PCN reaction occurring within the last 10 years: unknown If all of the above answers are "NO", then may proceed with Cephalosporin use. 5/3 Tolerated Cefepime x 1 dose in ED.     Chief Complaint  Patient presents with  . Medical Management of Chronic Issues    Other chronic pulmonary embolism without acute cor pulmonale; chronic deep vein thrombosis (DVT) of left lower extremity unspecified vein; essential hypertension; cerebrovascular accident (CVA): due to other mechanism.     HPI:  He is a 73 year old long term resident of this facility being seen for the management of his chronic illnesses: dvt; pulmonary embolism; hypertension; cva. He denies any uncontrolled pain; no changes in appetite; no worsening edema; no anxiety or insomnia.   Past Medical History:  Diagnosis Date  . Dysphasia   . Fatty liver   . Hemiplegia (HCC)   . Hypertension   . Pneumonia   . Pulmonary embolism (HCC)   . 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(HCC)   . Stroke [facility])     Past Surgical History:  Procedure Laterality Date  . FLEXIBLE SIGMOIDOSCOPY N/A 10/21/2016   Procedure: FLEXIBLE SIGMOIDOSCOPY;  Surgeon: Malissa Hippo, MD;  [facility];  Service: Endoscopy;  Laterality: N/A;  . FLEXIBLE SIGMOIDOSCOPY N/A 10/25/2016   Procedure: FLEXIBLE SIGMOIDOSCOPY;  Surgeon: Malissa Hippo, MD;  [facility];  Service: Endoscopy;  Laterality: N/A;  . unable      Social History   Socioeconomic History    . Marital status: Single    Spouse name: Not on file  . Number of children: Not on file  . Years of education: Not on file  . Highest education level: Not on file  Occupational History  . Not on file  Social Needs  . Financial resource strain: Not hard at all  . Food insecurity:    Worry: Never true    Inability: Never true  . Transportation needs:    Medical: No    Non-medical: No  Tobacco Use  . Smoking status: Never Smoker  . Smokeless tobacco: Never Used  Substance and Sexual Activity  . Alcohol use: No    Alcohol/week: 0.0 standard drinks  . Drug use: No  . Sexual activity: Not on file  Lifestyle  . Physical activity:    Days per week: 0 days    Minutes per session: 0 min  . Stress: Not at all  Relationships  . Social connections:    Talks on phone: Never    Gets together: Once a week    Attends religious service: Never    Active member of club or organization: No    Attends meetings of clubs or organizations: Never    Relationship status: Never married  . Intimate partner violence:    Fear of current or ex partner: No    Emotionally abused: No    Physically abused: No    Forced sexual activity: No  Other Topics Concern  . Not on file  Social History  Narrative  . Not on file   History reviewed. No pertinent family history.    VITAL SIGNS BP 118/81   Pulse 69   Temp 98.7 F (37.1 C)   Resp 18   Ht 5' 8.5" (1.74 m)   Wt 214 lb 8 oz (97.3 kg)   BMI 32.14 kg/m   Outpatient Encounter Medications as of 08/17/2018  Medication Sig  . acetaminophen (TYLENOL) 325 MG tablet Take 650 mg by mouth every 6 (six) hours as needed for moderate pain.   Marland Kitchen atorvastatin (LIPITOR) 10 MG tablet Take 10 mg by mouth daily.  . budesonide-formoterol (SYMBICORT) 80-4.5 MCG/ACT inhaler Inhale 2 puffs into the lungs 2 (two) times daily.  . carboxymethylcellulose (REFRESH PLUS) 0.5 % SOLN Place 1 drop into the right eye 3 (three) times daily.   Marland Kitchen erythromycin ophthalmic  ointment Place into the left eye. appy 1 ribbon ophthalmic (eye) 3 times a day  . hydrochlorothiazide (HYDRODIURIL) 12.5 MG tablet Take 12.5 mg by mouth daily.  Marland Kitchen ipratropium-albuterol (DUONEB) 0.5-2.5 (3) MG/3ML SOLN Take 3 mLs by nebulization every 6 (six) hours as needed.  . labetalol (NORMODYNE) 200 MG tablet Take 200 mg by mouth daily. For HTN  . loratadine (CLARITIN) 10 MG tablet Take 10 mg by mouth daily.  Marland Kitchen losartan (COZAAR) 50 MG tablet Take 50 mg by mouth daily.  . NON FORMULARY Diet Type:  Dysphagia 1 diet with honey thick liquids, NAS  . polyethylene glycol (MIRALAX / GLYCOLAX) packet Take 17 g by mouth at bedtime.  . sennosides-docusate sodium (SENOKOT-S) 8.6-50 MG tablet Take 1 tablet by mouth at bedtime. For constipation  . shark liver oil-cocoa butter (HEMORRHOIDAL) 0.25-88.44 % suppository Place 1 suppository rectally at bedtime as needed for hemorrhoids.  Marland Kitchen umeclidinium bromide (INCRUSE ELLIPTA) 62.5 MCG/INH AEPB Inhale 1 puff into the lungs daily.  Marland Kitchen warfarin (COUMADIN) 1 MG tablet Take 1 mg by mouth. Take along with 4 mg to = 4.5 mg on Tues., Thurs., Sat.  . warfarin (COUMADIN) 4 MG tablet Take 4 mg tablet by mouth once a day on Sun., Mon.,Wed.,Fri.  . warfarin (COUMADIN) 4 MG tablet Take 4 mg by mouth daily. Take along with 1 mg to = 4.5 mg on Tues.,Thurs., Sat.  . [DISCONTINUED] ipratropium (ATROVENT) 0.02 % nebulizer solution Take 0.5 mg by nebulization every 6 (six) hours.   No facility-administered encounter medications on file as of 08/17/2018.      SIGNIFICANT DIAGNOSTIC EXAMS  LABS REVIEWED:   04-24-18: tsh 3.326 free T 4: 0.84 05-04-18: glucose 85; bun 31; creat 1.08 k+ 4.0;  na++ 138 ca 8.6; liver normal albumin 3.4 chol 107; ldl 54; trig 50; hdl 43  06-02-18: glucose 88; bun 25; creat 1.17; k+ 4.0; na++ 137; ca 8.7  07-28-18: tsh 0.299; free T3: 2.8; free T4: 0.92 08-17-18: INR 2.5   Review of Systems  Constitutional: Negative for malaise/fatigue.    Respiratory: Negative for cough and shortness of breath.   Cardiovascular: Negative for chest pain, palpitations and leg swelling.  Gastrointestinal: Negative for abdominal pain, constipation and heartburn.  Musculoskeletal: Negative for back pain, joint pain and myalgias.  Skin: Negative.   Neurological: Negative for dizziness.  Psychiatric/Behavioral: The patient is not nervous/anxious.     Physical Exam Constitutional:      General: He is not in acute distress.    Appearance: He is well-developed. He is not diaphoretic.  Neck:     Musculoskeletal: Neck supple.     Thyroid: No thyromegaly.  Cardiovascular:     Rate and Rhythm: Normal rate and regular rhythm.     Pulses: Normal pulses.     Heart sounds: Normal heart sounds.  Pulmonary:     Effort: Pulmonary effort is normal. No respiratory distress.     Comments: Has few scattered rales present  Abdominal:     General: Bowel sounds are normal. There is no distension.     Palpations: Abdomen is soft.     Tenderness: There is no abdominal tenderness.  Musculoskeletal:     Right lower leg: No edema.     Left lower leg: No edema.     Comments: Right hemiplegia Is able to move left extremities   Lymphadenopathy:     Cervical: No cervical adenopathy.  Skin:    General: Skin is warm and dry.     Comments: Bilateral lower extremities discolored   Neurological:     Mental Status: He is alert. Mental status is at baseline.  Psychiatric:        Mood and Affect: Mood normal.      ASSESSMENT/ PLAN:  TODAY:   1. Deep vein thrombosis (DVT) of left lower extremity unspecified vein/ other chronic pulmonary embolism without acute cor pulmonale: is stable is on chronic coumadin therapy; INR 2.5 will monitor   2. Essential hypertension: is stable b/p 11/81: will continue cozaar 50 mg daily; hctz 12.5 mg daily   3. Cerebrovascular accident (CVA): due to other mechanism/hemiplegia or right dominant side due to cerebrovascular  disease: is neurologically stable is on long term coumadin therapy   4. Simple other chronic bronchitis: is stable will continue symbicort 80/4.5 mcg 2 puffs twice daily claritin 10 mg daily incruse ellipta 62.5 mcg daily has duoneb every 6 hours as needed   5. Sigmoid volvulus: is stable will continue senna s daily will stop miralax as he is on honey thick liquids.   6. Dysphagia due to old cerebrovascular accident: is without signs of aspiration present will continue honey thick liquids.   7. Dyslipidemia: is stable LDL 54 will continue lipitor 10 mg daily   8. Hyperthyroidism: is stable tsh 0.299; free T3: 2.8; free T4: 0.92 will monitor        MD is aware of resident's narcotic use and is in agreement with current plan of care. We will attempt to wean resident as apropriate   Synthia Innocenteborah Kairie Vangieson NP [facility]  Contact [phone] Monday through Friday 8am- 5pm  After hours call [phone]

## 2018-08-23 DIAGNOSIS — I739 Peripheral vascular disease, unspecified: Secondary | ICD-10-CM | POA: Diagnosis not present

## 2018-08-23 DIAGNOSIS — B351 Tinea unguium: Secondary | ICD-10-CM | POA: Diagnosis not present

## 2018-08-23 DIAGNOSIS — G629 Polyneuropathy, unspecified: Secondary | ICD-10-CM | POA: Diagnosis not present

## 2018-08-25 DIAGNOSIS — I69391 Dysphagia following cerebral infarction: Secondary | ICD-10-CM | POA: Insufficient documentation

## 2018-08-25 DIAGNOSIS — E785 Hyperlipidemia, unspecified: Secondary | ICD-10-CM | POA: Insufficient documentation

## 2018-09-13 ENCOUNTER — Encounter: Payer: Self-pay | Admitting: Internal Medicine

## 2018-09-13 NOTE — Progress Notes (Signed)
Entered in error

## 2018-09-13 NOTE — Progress Notes (Deleted)
Location:  [facility] Room Number: 14 W Place of Service:  SNF [phone]) Provider:  Einar Crow, MD  Mahlon Gammon, MD  [name], [name]  Extended Emergency Contact Information Primary Emergency Contact: [name] Address: [address]          [place], [place] [address] [place] Home Phone: [phone] Mobile Phone: [phone] Relation: Brother  Code Status:  Full Code Goals of care: Advanced Directive information Advanced Directives 08/17/2018  Does Patient Have a Medical Advance Directive? No  Type of Advance Directive -  Does patient want to make changes to medical advance directive? No - Patient declined  Copy of Healthcare Power of Attorney in Chart? -  Would patient like information on creating a medical advance directive? No - Patient declined     Chief Complaint  Patient presents with  . Medical Management of Chronic Issues    Hypertension, DVT    HPI:  Pt is a 73 y.o. male seen today for medical management of chronic diseases.     Past Medical History:  Diagnosis Date  . Dysphasia   . Fatty liver   . Hemiplegia (HCC)   . Hypertension   . Pneumonia   . Pulmonary embolism (HCC)   . 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(HCC)   . Stroke [facility])    Past Surgical History:  Procedure Laterality Date  . FLEXIBLE SIGMOIDOSCOPY N/A 10/21/2016   Procedure: FLEXIBLE SIGMOIDOSCOPY;  Surgeon: Malissa Hippo, MD;  [facility];  Service: Endoscopy;  Laterality: N/A;  . FLEXIBLE SIGMOIDOSCOPY N/A 10/25/2016   Procedure: FLEXIBLE SIGMOIDOSCOPY;  Surgeon: Malissa Hippo, MD;  [facility];  Service: Endoscopy;  Laterality: N/A;  . unable      Allergies  Allergen Reactions  . Penicillins     Has patient had a PCN reaction causing immediate rash, facial/tongue/throat swelling, SOB or lightheadedness with hypotension: unknown Has patient had a PCN reaction causing  severe rash involving mucus membranes or skin necrosis: unknown Has patient had a PCN reaction that required hospitalization: unknown Has patient had a PCN reaction occurring within the last 10 years: unknown If all of the above answers are "NO", then may proceed with Cephalosporin use. 5/3 Tolerated Cefepime x 1 dose in ED.     Outpatient Encounter Medications as of 09/13/2018  Medication Sig  . acetaminophen (TYLENOL) 325 MG tablet Take 650 mg by mouth every 6 (six) hours as needed for moderate pain.   Marland Kitchen atorvastatin (LIPITOR) 10 MG tablet Take 10 mg by mouth daily.  . budesonide-formoterol (SYMBICORT) 80-4.5 MCG/ACT inhaler Inhale 2 puffs into the lungs 2 (two) times daily.  . carboxymethylcellulose (REFRESH PLUS) 0.5 % SOLN Place 1 drop into the right eye 3 (three) times daily.   Marland Kitchen erythromycin ophthalmic ointment Place into the left eye. appy 1 ribbon ophthalmic (eye) 3 times a day  . hydrochlorothiazide (HYDRODIURIL) 12.5 MG tablet Take 12.5 mg by mouth daily.  Marland Kitchen ipratropium-albuterol (DUONEB) 0.5-2.5 (3) MG/3ML SOLN Take 3 mLs by nebulization every 6 (six) hours as needed.  . labetalol (NORMODYNE) 200 MG tablet Take 200 mg by mouth daily. For HTN  . loratadine (CLARITIN) 10 MG tablet Take 10 mg by mouth daily.  Marland Kitchen losartan (COZAAR) 50 MG tablet Take 50 mg by mouth daily.  . NON FORMULARY Diet Type:  Dysphagia 1 diet with honey thick liquids, NAS  . sennosides-docusate sodium (SENOKOT-S) 8.6-50  MG tablet Take 1 tablet by mouth at bedtime. For constipation  . shark liver oil-cocoa butter (HEMORRHOIDAL) 0.25-88.44 % suppository Place 1 suppository rectally at bedtime as needed for hemorrhoids.  Marland Kitchen umeclidinium bromide (INCRUSE ELLIPTA) 62.5 MCG/INH AEPB Inhale 1 puff into the lungs daily.  Marland Kitchen warfarin (COUMADIN) 1 MG tablet Take 1 mg by mouth. Take along with 4 mg to = 4.5 mg on Tues., Thurs., Sat.  . warfarin (COUMADIN) 4 MG tablet Take 4 mg tablet by mouth once a day on Sun.,  Mon.,Wed.,Fri.  . warfarin (COUMADIN) 4 MG tablet Take 4 mg by mouth daily. Take along with 1 mg to = 4.5 mg on Tues.,Thurs., Sat.  . [DISCONTINUED] polyethylene glycol (MIRALAX / GLYCOLAX) packet Take 17 g by mouth at bedtime.   No facility-administered encounter medications on file as of 09/13/2018.     Review of Systems  Immunization History  Administered Date(s) Administered  . Influenza-Unspecified 03/28/2014, 03/23/2016, 03/25/2017, 03/23/2018  . Pneumococcal Conjugate-13 04/21/2015  . Pneumococcal-Unspecified 11/01/2009, 03/30/2016  . Tdap 03/17/2017   Pertinent  Health Maintenance Due  Topic Date Due  . COLONOSCOPY  10/30/2025 (Originally 11/08/1995)  . INFLUENZA VACCINE  Completed  . PNA vac Low Risk Adult  Completed   Fall Risk  03/27/2018 04/05/2017 03/17/2017  Falls in the past year? No No No  Risk for fall due to : - Impaired balance/gait;Impaired mobility;Medication side effect;Impaired vision;History of fall(s) -   Functional Status Survey:    Vitals:   09/13/18 1412  BP: (!) 148/88  Pulse: 74  Resp: 12  Temp: (!) 97.4 F (36.3 C)  Weight: 212 lb 8 oz (96.4 kg)  Height: 5' 8.5" (1.74 m)   Body mass index is 31.84 kg/m. Physical Exam  Labs reviewed: Recent Labs    05/04/18 0700 05/26/18 0700 06/02/18 0700  NA 138 140 137  K 4.0 4.0 4.0  CL 102 102 101  CO2 28 30 29   GLUCOSE 85 81 88  BUN 31* 24* 25*  CREATININE 1.08 1.22 1.17  CALCIUM 8.6* 8.9 8.7*   Recent Labs    05/04/18 0700  AST 24  ALT 33  ALKPHOS 57  BILITOT 0.8  PROT 7.3  ALBUMIN 3.4*   Recent Labs    11/16/17 0709 01/23/18 0315 04/20/18 0709  WBC 8.2 10.3 6.7  NEUTROABS  --   --  3.8  HGB 15.9 14.4 16.0  HCT 48.5 44.4 49.7  MCV 95.3 96.5 96.9  PLT 269 281 246   Lab Results  Component Value Date   TSH 0.299 (L) 07/28/2018   Lab Results  Component Value Date   HGBA1C 6.0 04/26/2009   Lab Results  Component Value Date   CHOL 107 05/04/2018   HDL 43 05/04/2018    LDLCALC 54 05/04/2018   TRIG 50 05/04/2018   CHOLHDL 2.5 05/04/2018    Significant Diagnostic Results in last 30 days:  No results found.  Assessment/Plan There are no diagnoses linked to this encounter.   Family/ staff Communication: ***  Labs/tests ordered:  ***

## 2018-09-14 ENCOUNTER — Encounter (HOSPITAL_COMMUNITY)
Admission: RE | Admit: 2018-09-14 | Discharge: 2018-09-14 | Disposition: A | Discharge status: Home / Self Care | Payer: Medicare Other | Source: Skilled Nursing Facility | Attending: Internal Medicine | Admitting: Internal Medicine

## 2018-09-14 ENCOUNTER — Encounter: Payer: Self-pay | Admitting: Internal Medicine

## 2018-09-14 ENCOUNTER — Non-Acute Institutional Stay (SKILLED_NURSING_FACILITY): Payer: Medicare Other | Admitting: Internal Medicine

## 2018-09-14 DIAGNOSIS — R1312 Dysphagia, oropharyngeal phase: Secondary | ICD-10-CM | POA: Diagnosis not present

## 2018-09-14 DIAGNOSIS — K5900 Constipation, unspecified: Secondary | ICD-10-CM | POA: Insufficient documentation

## 2018-09-14 DIAGNOSIS — I2782 Chronic pulmonary embolism: Secondary | ICD-10-CM | POA: Diagnosis not present

## 2018-09-14 DIAGNOSIS — I10 Essential (primary) hypertension: Secondary | ICD-10-CM

## 2018-09-14 DIAGNOSIS — J410 Simple chronic bronchitis: Secondary | ICD-10-CM

## 2018-09-14 DIAGNOSIS — R0981 Nasal congestion: Secondary | ICD-10-CM | POA: Insufficient documentation

## 2018-09-14 DIAGNOSIS — H179 Unspecified corneal scar and opacity: Secondary | ICD-10-CM | POA: Diagnosis not present

## 2018-09-14 DIAGNOSIS — K649 Unspecified hemorrhoids: Secondary | ICD-10-CM | POA: Diagnosis not present

## 2018-09-14 DIAGNOSIS — G8191 Hemiplegia, unspecified affecting right dominant side: Secondary | ICD-10-CM | POA: Diagnosis not present

## 2018-09-14 DIAGNOSIS — E785 Hyperlipidemia, unspecified: Secondary | ICD-10-CM | POA: Diagnosis not present

## 2018-09-14 DIAGNOSIS — I679 Cerebrovascular disease, unspecified: Secondary | ICD-10-CM

## 2018-09-14 LAB — PROTIME-INR
INR: 2.7 — ABNORMAL HIGH (ref 0.8–1.2)
Prothrombin Time: 28.4 seconds — ABNORMAL HIGH (ref 11.4–15.2)

## 2018-09-14 NOTE — Progress Notes (Signed)
Location:  [facility] Room Number: 13 W Place of Service:  SNF [phone]) Provider:  Einar Crow, MD  Mahlon Gammon, MD  [name], [name]  Extended Emergency Contact Information Primary Emergency Contact: [name] Address: [address]          [place], [place] [address] [place] of [place] Home Phone: [phone] Mobile Phone: [phone] Relation: Brother  Code Status:  Full Code Goals of care: Advanced Directive information Advanced Directives 08/17/2018  Does Patient Have a Medical Advance Directive? No  Type of Advance Directive -  Does patient want to make changes to medical advance directive? No - Patient declined  Copy of Healthcare Power of Attorney in Chart? -  Would patient like information on creating a medical advance directive? No - Patient declined     Chief Complaint  Patient presents with  . Medical Management of Chronic Issues    Hypertension, DVT    HPI:  Pt is a 73 y.o. male seen today for medical management of chronic diseases.    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 And Left Corneal Abrasion. Patient is doing well in facility.  He did not have any new complaints.  His cough is better. He continues to follow with ophthalmology for his left eye abrasion and is on chronic azithromycin ointment Continues to have good appetite.  No new nursing issues.  His bowels are moving well       Past Medical History:  Diagnosis Date  . Dysphasia   . Fatty liver   . Hemiplegia (HCC)   . Hypertension   . Pneumonia   . Pulmonary embolism (HCC)   . 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(HCC)   . Stroke [facility])    Past Surgical History:  Procedure Laterality Date  . FLEXIBLE SIGMOIDOSCOPY N/A 10/21/2016   Procedure: FLEXIBLE SIGMOIDOSCOPY;  Surgeon: Malissa Hippo,  MD;  Location: [facility];  Service: Endoscopy;  Laterality: N/A;  . FLEXIBLE SIGMOIDOSCOPY N/A 10/25/2016   Procedure: FLEXIBLE SIGMOIDOSCOPY;  Surgeon: Malissa Hippo, MD;  Location: [facility];  Service: Endoscopy;  Laterality: N/A;  . unable      Allergies  Allergen Reactions  . Penicillins     Has patient had a PCN reaction causing immediate rash, facial/tongue/throat swelling, SOB or lightheadedness with hypotension: unknown Has patient had a PCN reaction causing severe rash involving mucus membranes or skin necrosis: unknown Has patient had a PCN reaction that required hospitalization: unknown Has patient had a PCN reaction occurring within the last 10 years: unknown If all of the above answers are "NO", then may proceed with Cephalosporin use. 5/3 Tolerated Cefepime x 1 dose in ED.     Outpatient Encounter Medications as of 09/14/2018  Medication Sig  . acetaminophen (TYLENOL) 325 MG tablet Take 650 mg by mouth every 6 (six) hours as needed for moderate pain.   Marland Kitchen atorvastatin (LIPITOR) 10 MG tablet Take 10 mg by mouth daily.  . budesonide-formoterol (SYMBICORT) 80-4.5 MCG/ACT inhaler Inhale 2 puffs into the lungs 2 (two) times daily.  . carboxymethylcellulose (REFRESH PLUS) 0.5 % SOLN Place 1 drop into the right eye 3 (three) times daily.   Marland Kitchen erythromycin ophthalmic ointment Place into the left eye. appy 1 ribbon ophthalmic (eye) 3 times a day  . hydrochlorothiazide (HYDRODIURIL) 12.5 MG tablet Take 12.5 mg by mouth  daily.  . ipratropium-albuterol (DUONEB) 0.5-2.5 (3) MG/3ML SOLN Take 3 mLs by nebulization every 6 (six) hours as needed.  . labetalol (NORMODYNE) 200 MG tablet Take 200 mg by mouth daily. For HTN  . loratadine (CLARITIN) 10 MG tablet Take 10 mg by mouth daily.  Marland Kitchen losartan (COZAAR) 50 MG tablet Take 50 mg by mouth daily.  . NON FORMULARY Diet Type:  Dysphagia 1 diet with honey thick liquids, NAS  . sennosides-docusate sodium (SENOKOT-S) 8.6-50 MG tablet Take 1  tablet by mouth at bedtime. For constipation  . shark liver oil-cocoa butter (HEMORRHOIDAL) 0.25-88.44 % suppository Place 1 suppository rectally at bedtime as needed for hemorrhoids.  Marland Kitchen umeclidinium bromide (INCRUSE ELLIPTA) 62.5 MCG/INH AEPB Inhale 1 puff into the lungs daily.  Marland Kitchen warfarin (COUMADIN) 1 MG tablet Take 1 mg by mouth. Take 1/2 tablet along with 4 mg to = 4.5 mg on Tues., Thurs., Sat.  . warfarin (COUMADIN) 4 MG tablet Take 4 mg tablet by mouth once a day on Sun., Mon.,Wed.,Fri.  . warfarin (COUMADIN) 4 MG tablet Take 4 mg by mouth daily. Take along with 1 mg (1/2 tablet) to = 4.5 mg on Tues.,Thurs., Sat.   No facility-administered encounter medications on file as of 09/14/2018.     Review of Systems  Constitutional: Negative.   HENT: Negative.   Eyes: Positive for discharge.  Respiratory: Negative.  Negative for cough.   Cardiovascular: Negative.   Gastrointestinal: Negative.   Genitourinary: Negative.   Musculoskeletal: Negative.   Skin: Negative.   Neurological: Negative.   Psychiatric/Behavioral: Negative.     Immunization History  Administered Date(s) Administered  . Influenza-Unspecified 03/28/2014, 03/23/2016, 03/25/2017, 03/23/2018  . Pneumococcal Conjugate-13 04/21/2015  . Pneumococcal-Unspecified 11/01/2009, 03/30/2016  . Tdap 03/17/2017   Pertinent  Health Maintenance Due  Topic Date Due  . COLONOSCOPY  10/30/2025 (Originally 11/08/1995)  . INFLUENZA VACCINE  Completed  . PNA vac Low Risk Adult  Completed   Fall Risk  03/27/2018 04/05/2017 03/17/2017  Falls in the past year? No No No  Risk for fall due to : - Impaired balance/gait;Impaired mobility;Medication side effect;Impaired vision;History of fall(s) -   Functional Status Survey:    Vitals:   09/14/18 0858  BP: (!) 153/92  Pulse: 71  Resp: 20  Temp: 97.6 F (36.4 C)  Weight: 212 lb 8 oz (96.4 kg)  Height: 5' 8.5" (1.74 m)   Body mass index is 31.84 kg/m. Physical Exam Constitutional:       Appearance: He is well-developed.  HENT:     Head: Normocephalic.  Neck:     Musculoskeletal: Neck supple.  Cardiovascular:     Rate and Rhythm: Normal rate and regular rhythm.  Pulmonary:     Effort: Pulmonary effort is normal. No respiratory distress.     Breath sounds: Normal breath sounds. No stridor. No wheezing.  Abdominal:     General: Bowel sounds are normal. There is no distension.     Palpations: Abdomen is soft.     Tenderness: There is no abdominal tenderness. There is no guarding.  Musculoskeletal:     Comments: Chronic Venous stasis Changes bilateral  Lymphadenopathy:     Cervical: No cervical adenopathy.  Skin:    General: Skin is warm and dry.  Neurological:     Mental Status: He is alert and oriented to person, place, and time.     Comments: Right Sided hemiparesis Wheelchair dependent.  Psychiatric:        Behavior: Behavior normal.  Labs reviewed: Recent Labs    05/04/18 0700 05/26/18 0700 06/02/18 0700  NA 138 140 137  K 4.0 4.0 4.0  CL 102 102 101  CO2 28 30 29   GLUCOSE 85 81 88  BUN 31* 24* 25*  CREATININE 1.08 1.22 1.17  CALCIUM 8.6* 8.9 8.7*   Recent Labs    05/04/18 0700  AST 24  ALT 33  ALKPHOS 57  BILITOT 0.8  PROT 7.3  ALBUMIN 3.4*   Recent Labs    11/16/17 0709 01/23/18 0315 04/20/18 0709  WBC 8.2 10.3 6.7  NEUTROABS  --   --  3.8  HGB 15.9 14.4 16.0  HCT 48.5 44.4 49.7  MCV 95.3 96.5 96.9  PLT 269 281 246   Lab Results  Component Value Date   TSH 0.299 (L) 07/28/2018   Lab Results  Component Value Date   HGBA1C 6.0 04/26/2009   Lab Results  Component Value Date   CHOL 107 05/04/2018   HDL 43 05/04/2018   LDLCALC 54 05/04/2018   TRIG 50 05/04/2018   CHOLHDL 2.5 05/04/2018    Significant Diagnostic Results in last 30 days:  No results found.  Assessment/Plan   COPD  Has been stable on Symbicort and Incruse  Essential hypertension On Labetolol.and Cozaar And HCTZ low dose Corneal Abrasion  On ErythromycinOintment Followswith Ophthalmology Per them he would need this Chronically Recurrent PE and chronic DVT On Coumadin Dose is adjusted for his INR Sigmoid volvulusresolved. Continue on Miralax Patient has done well  He has not had any new symptoms  Subclinical hyperthyroidism He was Taken off his Tapazole by Dr Fransico Him due to normal Thyroid function Will Follow TSH   S/P CVA Patient stable.  He is now on Dyphagic Diet and doing well. On Statin On Coumadin Hyperlipidemia On Low dose of statin LDL 54 in 11/19  Health Maintenance Had d/w the patient before and he is interested in Colon Cancer screening. I don't think he is good candidate for r colonoscopy Will order Cologuard   Family/ staff Communication:   Labs/tests ordered:  Labs to be done next visit  Total time spent in this patient care encounter was 25_ minutes; greater than 50% of the visit spent counseling patient, reviewing records , Labs and coordinating care for problems addressed at this encounter.

## 2018-10-05 ENCOUNTER — Other Ambulatory Visit (HOSPITAL_COMMUNITY)
Admission: RE | Admit: 2018-10-05 | Discharge: 2018-10-05 | Disposition: A | Discharge status: Home / Self Care | Payer: Medicare Other | Source: Skilled Nursing Facility | Attending: Internal Medicine | Admitting: Internal Medicine

## 2018-10-05 DIAGNOSIS — Z7901 Long term (current) use of anticoagulants: Secondary | ICD-10-CM | POA: Insufficient documentation

## 2018-10-05 LAB — PROTIME-INR
INR: 3.3 — ABNORMAL HIGH (ref 0.8–1.2)
Prothrombin Time: 33.1 seconds — ABNORMAL HIGH (ref 11.4–15.2)

## 2018-10-12 ENCOUNTER — Encounter (HOSPITAL_COMMUNITY)
Admission: RE | Admit: 2018-10-12 | Discharge: 2018-10-12 | Disposition: A | Discharge status: Home / Self Care | Payer: Medicare Other | Source: Skilled Nursing Facility | Attending: Internal Medicine | Admitting: Internal Medicine

## 2018-10-12 DIAGNOSIS — R1312 Dysphagia, oropharyngeal phase: Secondary | ICD-10-CM | POA: Diagnosis not present

## 2018-10-12 DIAGNOSIS — I10 Essential (primary) hypertension: Secondary | ICD-10-CM | POA: Insufficient documentation

## 2018-10-12 DIAGNOSIS — K562 Volvulus: Secondary | ICD-10-CM | POA: Diagnosis not present

## 2018-10-12 DIAGNOSIS — Z7901 Long term (current) use of anticoagulants: Secondary | ICD-10-CM | POA: Diagnosis not present

## 2018-10-12 DIAGNOSIS — M6281 Muscle weakness (generalized): Secondary | ICD-10-CM | POA: Insufficient documentation

## 2018-10-12 DIAGNOSIS — I69328 Other speech and language deficits following cerebral infarction: Secondary | ICD-10-CM | POA: Diagnosis not present

## 2018-10-12 LAB — PROTIME-INR
INR: 1.9 — ABNORMAL HIGH (ref 0.8–1.2)
Prothrombin Time: 21.9 seconds — ABNORMAL HIGH (ref 11.4–15.2)

## 2018-10-16 ENCOUNTER — Encounter: Payer: Self-pay | Admitting: Adult Health

## 2018-10-16 ENCOUNTER — Non-Acute Institutional Stay (SKILLED_NURSING_FACILITY): Payer: Medicare Other | Admitting: Adult Health

## 2018-10-16 DIAGNOSIS — K562 Volvulus: Secondary | ICD-10-CM | POA: Diagnosis not present

## 2018-10-16 DIAGNOSIS — I69391 Dysphagia following cerebral infarction: Secondary | ICD-10-CM

## 2018-10-16 DIAGNOSIS — J410 Simple chronic bronchitis: Secondary | ICD-10-CM

## 2018-10-16 NOTE — Progress Notes (Signed)
[facility]:   [facility] Room Number: [id number] W Place of Service:  SNF (31)   CODE STATUS: Full Code  Allergies  Allergen Reactions  . Penicillins     Has patient had a PCN reaction causing immediate rash, facial/tongue/throat swelling, SOB or lightheadedness with hypotension: unknown Has patient had a PCN reaction causing severe rash involving mucus membranes or skin necrosis: unknown Has patient had a PCN reaction that required hospitalization: unknown Has patient had a PCN reaction occurring within the last 10 years: unknown If all of the above answers are "NO", then may proceed with Cephalosporin use. 5/3 Tolerated Cefepime x 1 dose in ED.     Chief Complaint  Patient presents with  . Medical Management of Chronic Issues    Simple chronic bronchitis; sigmoid volvulus; dysphagia due to old cerebrovascular accident     HPI:  He is a 73 year old long term resident of this facility being seen for the management of his chronic illnesses: bronchitis; sigmoid volvulus; dysphagia. He denies any choking; no constipation; no uncontrolled pain; there are no reports of fevers present.   Past Medical History:  Diagnosis Date  . Dysphasia   . Fatty liver   . Hemiplegia (HCC)   . Hypertension   . Pneumonia   . Pulmonary embolism (HCC)   . 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(HCC)   . Stroke [facility](HCC)     Past Surgical History:  Procedure Laterality Date  . FLEXIBLE SIGMOIDOSCOPY N/A 10/21/2016   Procedure: FLEXIBLE SIGMOIDOSCOPY;  Surgeon: Malissa Hippoehman, Najeeb U, MD;  Location: [facility];  Service: Endoscopy;  Laterality: N/A;  . FLEXIBLE SIGMOIDOSCOPY N/A 10/25/2016   Procedure: FLEXIBLE SIGMOIDOSCOPY;  Surgeon: Malissa Hippoehman, Najeeb U, MD;  Location: [facility];  Service: Endoscopy;  Laterality: N/A;  . unable      Social History   Socioeconomic History  . Marital status: Single    Spouse name: Not on file  . Number of children: Not on file  . Years of education: Not on file  .  Highest education level: Not on file  Occupational History  . Not on file  Social Needs  . Financial resource strain: Not hard at all  . Food insecurity:    Worry: Never true    Inability: Never true  . Transportation needs:    Medical: No    Non-medical: No  Tobacco Use  . Smoking status: Never Smoker  . Smokeless tobacco: Never Used  Substance and Sexual Activity  . Alcohol use: No    Alcohol/week: 0.0 standard drinks  . Drug use: No  . Sexual activity: Not on file  Lifestyle  . Physical activity:    Days per week: 0 days    Minutes per session: 0 min  . Stress: Not at all  Relationships  . Social connections:    Talks on phone: Never    Gets together: Once a week    Attends religious service: Never    Active member of club or organization: No    Attends meetings of clubs or organizations: Never    Relationship status: Never married  . Intimate partner violence:    Fear of current or ex partner: No    Emotionally abused: No    Physically abused: No    Forced sexual activity: No  Other Topics Concern  . Not on file  Social History Narrative  . Not on file   History reviewed. No pertinent family history.    VITAL SIGNS  BP (!) 141/69   Pulse 77   Temp (!) 97.5 F (36.4 C)   Resp 20   Ht 5' 8.5" (1.74 m)   Wt 216 lb 12.8 oz (98.3 kg)   BMI 32.48 kg/m   Outpatient Encounter Medications as of 10/16/2018  Medication Sig  . acetaminophen (TYLENOL) 325 MG tablet Take 650 mg by mouth every 6 (six) hours as needed for moderate pain.   Marland Kitchen atorvastatin (LIPITOR) 10 MG tablet Take 10 mg by mouth daily.  . budesonide-formoterol (SYMBICORT) 80-4.5 MCG/ACT inhaler Inhale 2 puffs into the lungs 2 (two) times daily.  . carboxymethylcellulose (REFRESH PLUS) 0.5 % SOLN Place 1 drop into the right eye 3 (three) times daily.   Marland Kitchen erythromycin ophthalmic ointment Place into the left eye. appy 1 ribbon ophthalmic (eye) 3 times a day  . hydrochlorothiazide (HYDRODIURIL) 12.5 MG  tablet Take 12.5 mg by mouth daily.  Marland Kitchen ipratropium-albuterol (DUONEB) 0.5-2.5 (3) MG/3ML SOLN Take 3 mLs by nebulization every 6 (six) hours as needed.  . labetalol (NORMODYNE) 200 MG tablet Take 200 mg by mouth daily. For HTN  . loratadine (CLARITIN) 10 MG tablet Take 10 mg by mouth daily.  . NON FORMULARY Diet Type:  Dysphagia 1 diet with honey thick liquids, NAS  . sennosides-docusate sodium (SENOKOT-S) 8.6-50 MG tablet Take 1 tablet by mouth at bedtime. For constipation  . shark liver oil-cocoa butter (HEMORRHOIDAL) 0.25-88.44 % suppository Place 1 suppository rectally at bedtime as needed for hemorrhoids.  Marland Kitchen umeclidinium bromide (INCRUSE ELLIPTA) 62.5 MCG/INH AEPB Inhale 1 puff into the lungs daily.  . valsartan (DIOVAN) 80 MG tablet Take 80 mg by mouth daily.  Marland Kitchen warfarin (COUMADIN) 1 MG tablet Take 1 mg by mouth. Take 1/2 tablet along with 4 mg to = 4.5 mg once daily on Wednesday  . warfarin (COUMADIN) 4 MG tablet Take 4 mg tablet along with 0.5 mg to equal 4.5 mg by mouth once a day on Wed.  Marland Kitchen warfarin (COUMADIN) 4 MG tablet Take 4 mg by mouth daily. Give on Sun, Mon, Tue, Thurs, Fri, Sat  . [DISCONTINUED] losartan (COZAAR) 50 MG tablet Take 50 mg by mouth daily.   No facility-administered encounter medications on file as of 10/16/2018.      SIGNIFICANT DIAGNOSTIC EXAMS  LABS REVIEWED: PREVIOUS   04-24-18: tsh 3.326 free T 4: 0.84 05-04-18: glucose 85; bun 31; creat 1.08 k+ 4.0;  na++ 138 ca 8.6; liver normal albumin 3.4 chol 107; ldl 54; trig 50; hdl 43  06-02-18: glucose 88; bun 25; creat 1.17; k+ 4.0; na++ 137; ca 8.7  07-28-18: tsh 0.299; free T3: 2.8; free T4: 0.92 08-17-18: INR 2.5   TODAY;   09-14-18: INR 2.7 10-05-18: INR 3.3 10-12-18: INR 1.9    Review of Systems  Constitutional: Negative for malaise/fatigue.  Respiratory: Negative for cough and shortness of breath.   Cardiovascular: Negative for chest pain, palpitations and leg swelling.  Gastrointestinal: Negative for  abdominal pain, constipation and heartburn.  Musculoskeletal: Negative for back pain, joint pain and myalgias.  Skin: Negative.   Neurological: Negative for dizziness.  Psychiatric/Behavioral: The patient is not nervous/anxious.      Physical Exam Constitutional:      General: He is not in acute distress.    Appearance: He is obese. He is not diaphoretic.  Neck:     Musculoskeletal: Neck supple.     Thyroid: No thyromegaly.  Cardiovascular:     Rate and Rhythm: Normal rate and regular rhythm.  Pulses: Normal pulses.     Heart sounds: Normal heart sounds.  Pulmonary:     Effort: Pulmonary effort is normal. No respiratory distress.     Breath sounds: Normal breath sounds.  Abdominal:     General: Bowel sounds are normal. There is no distension.     Palpations: Abdomen is soft.     Tenderness: There is no abdominal tenderness.  Musculoskeletal:     Right lower leg: No edema.     Left lower leg: No edema.     Comments: Right hemiplegia Is able to move left extremities    Lymphadenopathy:     Cervical: No cervical adenopathy.  Skin:    General: Skin is warm and dry.     Comments:  Bilateral lower extremities discolored    Neurological:     Mental Status: He is alert. Mental status is at baseline.  Psychiatric:        Mood and Affect: Mood normal.      ASSESSMENT/ PLAN:  TODAY:   1. Simple other chronic bronchitis: is stable will continue sumbicort 80/4,5 mcg 2 puffs twice daily claritin 10 mg daily incruse ellipta 62.5 mcg daily and has duoneb every 6 hours as needed  2. Sigmoid volvulus: is stable will continue senna s daily   3. 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   PREVIOUS  4. Deep vein thrombosis (DVT) of left lower extremity unspecified vein/ other chronic pulmonary embolism without acute cor pulmonale: is stable is on chronic coumadin therapy; INR managed by Dr.  Chales Abrahams   5. Essential hypertension: is stable b/p 141/69: will continue cozaar 50 mg daily; hctz 12.5 mg daily   6. Cerebrovascular accident (CVA): due to other mechanism/hemiplegia or right dominant side due to cerebrovascular disease: is neurologically stable is on long term coumadin therapy   7. Dyslipidemia: is stable LDL 54 will continue lipitor 10 mg daily   8. Hyperthyroidism: is stable tsh 0.299; free T3: 2.8; free T4: 0.92 will monitor    MD is aware of resident's narcotic use and is in agreement with current plan of care. We will attempt to wean resident as apropriate   Synthia Innocent NP [facility]  Contact [phone] Monday through Friday 8am- 5pm  After hours call [phone]

## 2018-10-18 ENCOUNTER — Non-Acute Institutional Stay (SKILLED_NURSING_FACILITY): Payer: Medicare Other | Admitting: Adult Health

## 2018-10-18 ENCOUNTER — Encounter: Payer: Self-pay | Admitting: Adult Health

## 2018-10-18 DIAGNOSIS — I6389 Other cerebral infarction: Secondary | ICD-10-CM

## 2018-10-18 DIAGNOSIS — I69391 Dysphagia following cerebral infarction: Secondary | ICD-10-CM

## 2018-10-18 DIAGNOSIS — J410 Simple chronic bronchitis: Secondary | ICD-10-CM | POA: Diagnosis not present

## 2018-10-18 NOTE — Progress Notes (Signed)
Location:   [facility] Room Number: 44 W Place of Service:  SNF (31)   CODE STATUS: Full Code  Allergies  Allergen Reactions  . Penicillins     Has patient had a PCN reaction causing immediate rash, facial/tongue/throat swelling, SOB or lightheadedness with hypotension: unknown Has patient had a PCN reaction causing severe rash involving mucus membranes or skin necrosis: unknown Has patient had a PCN reaction that required hospitalization: unknown Has patient had a PCN reaction occurring within the last 10 years: unknown If all of the above answers are "NO", then may proceed with Cephalosporin use. 5/3 Tolerated Cefepime x 1 dose in ED.     Chief Complaint  Patient presents with  . Acute Visit    Care Plan Meeting    HPI:  We have come together for his routine care plan meeting. His BIMS 9/15. his weight is stable; he continues to wheel himself around the facility. He denies any uncontrolled pain; no changes in appetite; no insomnia; no anxiety or depressive thoughts.   Past Medical History:  Diagnosis Date  . Dysphasia   . Fatty liver   . Hemiplegia (HCC)   . Hypertension   . Pneumonia   . Pulmonary embolism (HCC)   . 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(HCC)   . Stroke [facility])     Past Surgical History:  Procedure Laterality Date  . FLEXIBLE SIGMOIDOSCOPY N/A 10/21/2016   Procedure: FLEXIBLE SIGMOIDOSCOPY;  Surgeon: Malissa Hippo, MD;  Location: [facility];  Service: Endoscopy;  Laterality: N/A;  . FLEXIBLE SIGMOIDOSCOPY N/A 10/25/2016   Procedure: FLEXIBLE SIGMOIDOSCOPY;  Surgeon: Malissa Hippo, MD;  Location: [facility];  Service: Endoscopy;  Laterality: N/A;  . unable      Social History   Socioeconomic History  . Marital status: Single    Spouse name: Not on file  . Number of children: Not on file  . Years of education: Not on file  . Highest education level: Not on file  Occupational History  . Not on file  Social Needs  .  Financial resource strain: Not hard at all  . Food insecurity:    Worry: Never true    Inability: Never true  . Transportation needs:    Medical: No    Non-medical: No  Tobacco Use  . Smoking status: Never Smoker  . Smokeless tobacco: Never Used  Substance and Sexual Activity  . Alcohol use: No    Alcohol/week: 0.0 standard drinks  . Drug use: No  . Sexual activity: Not on file  Lifestyle  . Physical activity:    Days per week: 0 days    Minutes per session: 0 min  . Stress: Not at all  Relationships  . Social connections:    Talks on phone: Never    Gets together: Once a week    Attends religious service: Never    Active member of club or organization: No    Attends meetings of clubs or organizations: Never    Relationship status: Never married  . Intimate partner violence:    Fear of current or ex partner: No    Emotionally abused: No    Physically abused: No    Forced sexual activity: No  Other Topics Concern  . Not on file  Social History Narrative  . Not on file   History reviewed. No pertinent family history.    VITAL SIGNS BP (!) 141/69   Pulse 77   Temp (!) 97.5  F (36.4 C)   Resp 20   Ht 5' 8.5" (1.74 m)   Wt 216 lb 12.8 oz (98.3 kg)   BMI 32.48 kg/m   Outpatient Encounter Medications as of 10/18/2018  Medication Sig  . acetaminophen (TYLENOL) 325 MG tablet Take 650 mg by mouth every 6 (six) hours as needed for moderate pain.   Marland Kitchen. atorvastatin (LIPITOR) 10 MG tablet Take 10 mg by mouth daily.  . budesonide-formoterol (SYMBICORT) 80-4.5 MCG/ACT inhaler Inhale 2 puffs into the lungs 2 (two) times daily.  . carboxymethylcellulose (REFRESH PLUS) 0.5 % SOLN Place 1 drop into the right eye 3 (three) times daily.   Marland Kitchen. erythromycin ophthalmic ointment Place into the left eye. appy 1 ribbon ophthalmic (eye) 3 times a day  . hydrochlorothiazide (HYDRODIURIL) 12.5 MG tablet Take 12.5 mg by mouth daily.  Marland Kitchen. ipratropium-albuterol (DUONEB) 0.5-2.5 (3) MG/3ML SOLN  Take 3 mLs by nebulization every 6 (six) hours as needed.  . labetalol (NORMODYNE) 200 MG tablet Take 200 mg by mouth daily. For HTN  . loratadine (CLARITIN) 10 MG tablet Take 10 mg by mouth daily.  . NON FORMULARY Diet Type:  Dysphagia 1 diet with honey thick liquids, NAS  . sennosides-docusate sodium (SENOKOT-S) 8.6-50 MG tablet Take 1 tablet by mouth at bedtime. For constipation  . shark liver oil-cocoa butter (HEMORRHOIDAL) 0.25-88.44 % suppository Place 1 suppository rectally at bedtime as needed for hemorrhoids.  Marland Kitchen. umeclidinium bromide (INCRUSE ELLIPTA) 62.5 MCG/INH AEPB Inhale 1 puff into the lungs daily.  . valsartan (DIOVAN) 80 MG tablet Take 80 mg by mouth daily.  Marland Kitchen. warfarin (COUMADIN) 1 MG tablet Take 1 mg by mouth. Take 1/2 tablet along with 4 mg to = 4.5 mg once daily on Wednesday  . warfarin (COUMADIN) 4 MG tablet Take 4 mg tablet along with 0.5 mg to equal 4.5 mg by mouth once a day on Wed.  Marland Kitchen. warfarin (COUMADIN) 4 MG tablet Take 4 mg by mouth daily. Give on Sun, Mon, Tue, Thurs, Fri, Sat   No facility-administered encounter medications on file as of 10/18/2018.      SIGNIFICANT DIAGNOSTIC EXAMS  LABS REVIEWED: PREVIOUS   04-24-18: tsh 3.326 free T 4: 0.84 05-04-18: glucose 85; bun 31; creat 1.08 k+ 4.0;  na++ 138 ca 8.6; liver normal albumin 3.4 chol 107; ldl 54; trig 50; hdl 43  06-02-18: glucose 88; bun 25; creat 1.17; k+ 4.0; na++ 137; ca 8.7  07-28-18: tsh 0.299; free T3: 2.8; free T4: 0.92 08-17-18: INR 2.5  09-14-18: INR 2.7 10-05-18: INR 3.3 10-12-18: INR 1.9   NO NEW LABS.    Review of Systems  Constitutional: Negative for malaise/fatigue.  Respiratory: Negative for cough and shortness of breath.   Cardiovascular: Negative for chest pain, palpitations and leg swelling.  Gastrointestinal: Negative for abdominal pain, constipation and heartburn.  Musculoskeletal: Negative for back pain, joint pain and myalgias.  Skin: Negative.   Neurological: Negative for  dizziness.  Psychiatric/Behavioral: The patient is not nervous/anxious.     Physical Exam Constitutional:      General: He is not in acute distress.    Appearance: He is obese. He is not diaphoretic.  Neck:     Musculoskeletal: Neck supple.     Thyroid: No thyromegaly.  Cardiovascular:     Rate and Rhythm: Normal rate and regular rhythm.     Pulses: Normal pulses.     Heart sounds: Normal heart sounds.  Pulmonary:     Effort: Pulmonary effort is normal.  No respiratory distress.     Breath sounds: Normal breath sounds.  Abdominal:     General: Bowel sounds are normal. There is no distension.     Palpations: Abdomen is soft.     Tenderness: There is no abdominal tenderness.  Musculoskeletal:     Right lower leg: No edema.     Left lower leg: No edema.     Comments: Right hemiplegia Is able to move left extremities     Lymphadenopathy:     Cervical: No cervical adenopathy.  Skin:    General: Skin is warm and dry.     Comments: Bilateral lower extremities discolored   Neurological:     Mental Status: He is alert. Mental status is at baseline.  Psychiatric:        Mood and Affect: Mood normal.     ASSESSMENT/ PLAN:  TODAY:   1. 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 Simple chronic bronchitis 3. 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   MD is aware of resident's narcotic use and is in agreement with current plan of care. We will attempt to wean resident as apropriate   Synthia Innocent NP [facility]  Contact [phone] Monday through Friday 8am- 5pm  After hours call [phone]

## 2018-10-19 ENCOUNTER — Encounter (HOSPITAL_COMMUNITY)
Admission: RE | Admit: 2018-10-19 | Discharge: 2018-10-19 | Disposition: A | Discharge status: Home / Self Care | Payer: Medicare Other | Source: Skilled Nursing Facility | Attending: Internal Medicine | Admitting: Internal Medicine

## 2018-10-19 DIAGNOSIS — R0981 Nasal congestion: Secondary | ICD-10-CM | POA: Insufficient documentation

## 2018-10-19 DIAGNOSIS — E785 Hyperlipidemia, unspecified: Secondary | ICD-10-CM | POA: Insufficient documentation

## 2018-10-19 DIAGNOSIS — H179 Unspecified corneal scar and opacity: Secondary | ICD-10-CM | POA: Diagnosis not present

## 2018-10-19 DIAGNOSIS — K5900 Constipation, unspecified: Secondary | ICD-10-CM | POA: Diagnosis not present

## 2018-10-19 DIAGNOSIS — R1312 Dysphagia, oropharyngeal phase: Secondary | ICD-10-CM | POA: Insufficient documentation

## 2018-10-19 DIAGNOSIS — K649 Unspecified hemorrhoids: Secondary | ICD-10-CM | POA: Insufficient documentation

## 2018-10-19 DIAGNOSIS — I10 Essential (primary) hypertension: Secondary | ICD-10-CM | POA: Diagnosis not present

## 2018-10-19 DIAGNOSIS — Z7901 Long term (current) use of anticoagulants: Secondary | ICD-10-CM | POA: Diagnosis not present

## 2018-10-19 LAB — PROTIME-INR
INR: 2.9 — ABNORMAL HIGH (ref 0.8–1.2)
Prothrombin Time: 30 seconds — ABNORMAL HIGH (ref 11.4–15.2)

## 2018-10-23 ENCOUNTER — Encounter: Payer: Self-pay | Admitting: Internal Medicine

## 2018-10-23 ENCOUNTER — Non-Acute Institutional Stay (SKILLED_NURSING_FACILITY): Payer: Medicare Other | Admitting: Internal Medicine

## 2018-10-23 DIAGNOSIS — J410 Simple chronic bronchitis: Secondary | ICD-10-CM | POA: Diagnosis not present

## 2018-10-23 DIAGNOSIS — I69398 Other sequelae of cerebral infarction: Secondary | ICD-10-CM | POA: Diagnosis not present

## 2018-10-23 DIAGNOSIS — I10 Essential (primary) hypertension: Secondary | ICD-10-CM | POA: Diagnosis not present

## 2018-10-23 DIAGNOSIS — R42 Dizziness and giddiness: Secondary | ICD-10-CM

## 2018-10-23 NOTE — Progress Notes (Signed)
A user error has taken place.

## 2018-10-23 NOTE — Progress Notes (Signed)
Location:  [facility] Room Number: [address]12 W Place of Service:  SNF [phone]) Provider:  Einar CrowAnjali Gupta, MD  Mahlon GammonGupta, Anjali L, MD  Patient Care Team: Mahlon GammonGupta, Anjali L, [name]  Extended Emergency Contact Information Primary Emergency Contact: Venetia MaxonWalker,[name] Jr Address: [address]          [place], [place] [address] [place] Home Phone: [phone] Mobile Phone: [phone] Relation: Brother  Code Status:  Full Code Goals of care: Advanced Directive information Advanced Directives 10/23/2018  Does Patient Have a Medical Advance Directive? No  Type of Advance Directive -  Does patient want to make changes to medical advance directive? No - Patient declined  Copy of Healthcare Power of Attorney in Chart? -  Would patient like information on creating a medical advance directive? No - Patient declined     Chief Complaint  Patient presents with  . Acute Visit    Vertigo    HPI:  Pt is a 73 y.o. 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 And Left Corneal Abrasion. Patient told the nurse today that she had an episode of vertigo yesterday morning when he was in the bed.  He said that the room was spinning.  Denied any nausea.  Denied any congestion or ear pain.  He states he does not have any of those symptoms today He does have cough. Continues to have good appetite No Fever or SOB  Past Medical History:  Diagnosis Date  . Dysphasia   . Fatty liver   . Hemiplegia (HCC)   . Hypertension   . Pneumonia   . Pulmonary embolism (HCC)   . 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(HCC)   . Stroke [facility](HCC)    Past Surgical History:  Procedure Laterality Date  . FLEXIBLE SIGMOIDOSCOPY N/A 10/21/2016   Procedure: FLEXIBLE SIGMOIDOSCOPY;  Surgeon: Malissa Hippoehman, Najeeb U, MD;  [facility];  Service: Endoscopy;  Laterality: N/A;  . FLEXIBLE SIGMOIDOSCOPY N/A 10/25/2016   Procedure: FLEXIBLE SIGMOIDOSCOPY;  Surgeon: Malissa Hippoehman, Najeeb U, MD;  [facility]: [facility];  Service: Endoscopy;  Laterality: N/A;  . unable      Allergies  Allergen Reactions  . Penicillins     Has patient had a PCN reaction causing immediate rash, facial/tongue/throat swelling, SOB or lightheadedness with hypotension: unknown Has patient had a PCN reaction causing severe rash involving mucus membranes or skin necrosis: unknown Has patient had a PCN reaction that required hospitalization: unknown Has patient had a PCN reaction occurring within the last 10 years: unknown If all of the above answers are "NO", then may proceed with Cephalosporin use. 5/3 Tolerated Cefepime x 1 dose in ED.     Outpatient Encounter Medications as of 10/23/2018  Medication Sig  . acetaminophen (TYLENOL) 325 MG tablet Take 650 mg by mouth every 6 (six) hours as needed for moderate pain.   Marland Kitchen. atorvastatin (LIPITOR) 10 MG tablet Take 10 mg by mouth daily.  . budesonide-formoterol (SYMBICORT) 80-4.5 MCG/ACT inhaler Inhale 2 puffs into the lungs 2 (two) times daily.  . carboxymethylcellulose (REFRESH PLUS) 0.5 % SOLN Place 1 drop into the right eye 3 (three) times daily.   Marland Kitchen. erythromycin ophthalmic ointment Place into the left eye. appy 1 ribbon ophthalmic (eye) 3 times a day  . hydrochlorothiazide (HYDRODIURIL) 12.5 MG tablet Take 12.5 mg by  mouth daily.  Marland Kitchen ipratropium-albuterol (DUONEB) 0.5-2.5 (3) MG/3ML SOLN Take 3 mLs by nebulization every 6 (six) hours as needed.  . labetalol (NORMODYNE) 200 MG tablet Take 200 mg by mouth daily. For HTN  . loratadine (CLARITIN) 10 MG tablet Take 10 mg by mouth daily.  . NON FORMULARY Diet Type:  Dysphagia 1 diet with honey thick liquids, NAS  . sennosides-docusate sodium (SENOKOT-S) 8.6-50 MG tablet Take 1 tablet by mouth at bedtime. For constipation  . shark liver oil-cocoa butter  (HEMORRHOIDAL) 0.25-88.44 % suppository Place 1 suppository rectally at bedtime as needed for hemorrhoids.  Marland Kitchen umeclidinium bromide (INCRUSE ELLIPTA) 62.5 MCG/INH AEPB Inhale 1 puff into the lungs daily.  . valsartan (DIOVAN) 80 MG tablet Take 80 mg by mouth daily.  Marland Kitchen warfarin (COUMADIN) 4 MG tablet Take 4 mg by mouth daily.   . [DISCONTINUED] warfarin (COUMADIN) 1 MG tablet Take 1 mg by mouth. Take 1/2 tablet along with 4 mg to = 4.5 mg once daily on Wednesday  . [DISCONTINUED] warfarin (COUMADIN) 4 MG tablet Take 4 mg by mouth daily. Give on Sun, Mon, Tue, Thurs, Fri, Sat   No facility-administered encounter medications on file as of 10/23/2018.     Review of Systems  Constitutional: Negative.   HENT: Negative.   Respiratory: Positive for cough.   Cardiovascular: Positive for leg swelling.  Gastrointestinal: Negative.   Genitourinary: Negative.   Musculoskeletal: Negative.   Skin: Negative.   Neurological: Positive for dizziness.  Hematological: Negative.   Psychiatric/Behavioral: Negative.     Immunization History  Administered Date(s) Administered  . Influenza-Unspecified 03/28/2014, 03/23/2016, 03/25/2017, 03/23/2018  . Pneumococcal Conjugate-13 04/21/2015  . Pneumococcal-Unspecified 11/01/2009, 03/30/2016  . Tdap 03/17/2017   Pertinent  Health Maintenance Due  Topic Date Due  . COLONOSCOPY  10/30/2025 (Originally 11/08/1995)  . INFLUENZA VACCINE  01/20/2019  . PNA vac Low Risk Adult  Completed   Fall Risk  03/27/2018 04/05/2017 03/17/2017  Falls in the past year? No No No  Risk for fall due to : - Impaired balance/gait;Impaired mobility;Medication side effect;Impaired vision;History of fall(s) -   Functional Status Survey:    Vitals:   10/23/18 1042  BP: 137/74  Pulse: 75  Resp: 20  Temp: (!) 97.5 F (36.4 C)  Weight: 218 lb 3.2 oz (99 kg)  Height: 5' 8.5" (1.74 m)   Body mass index is 32.69 kg/m. Physical Exam Vitals signs reviewed.  Constitutional:       Appearance: Normal appearance.  HENT:     Head: Normocephalic.     Nose: Nose normal.     Mouth/Throat:     Mouth: Mucous membranes are moist.  Cardiovascular:     Rate and Rhythm: Normal rate and regular rhythm.  Pulmonary:     Effort: Pulmonary effort is normal.     Comments: He had Bilateral Rales  Abdominal:     General: Abdomen is flat. Bowel sounds are normal.     Palpations: Abdomen is soft.  Musculoskeletal:     Comments: Chronic Venous Changes  Skin:    General: Skin is warm and dry.  Neurological:     Mental Status: He is alert.     Comments: Right Sided Hemiparesis  Psychiatric:        Mood and Affect: Mood normal.        Thought Content: Thought content normal.     Labs reviewed: Recent Labs    05/04/18 0700 05/26/18 0700 06/02/18 0700  NA 138 140 137  K 4.0 4.0 4.0  CL 102 102 101  CO2 28 30 29   GLUCOSE 85 81 88  BUN 31* 24* 25*  CREATININE 1.08 1.22 1.17  CALCIUM 8.6* 8.9 8.7*   Recent Labs    05/04/18 0700  AST 24  ALT 33  ALKPHOS 57  BILITOT 0.8  PROT 7.3  ALBUMIN 3.4*   Recent Labs    11/16/17 0709 01/23/18 0315 04/20/18 0709  WBC 8.2 10.3 6.7  NEUTROABS  --   --  3.8  HGB 15.9 14.4 16.0  HCT 48.5 44.4 49.7  MCV 95.3 96.5 96.9  PLT 269 281 246   Lab Results  Component Value Date   TSH 0.299 (L) 07/28/2018   Lab Results  Component Value Date   HGBA1C 6.0 04/26/2009   Lab Results  Component Value Date   CHOL 107 05/04/2018   HDL 43 05/04/2018   LDLCALC 54 05/04/2018   TRIG 50 05/04/2018   CHOLHDL 2.5 05/04/2018    Significant Diagnostic Results in last 30 days:  No results found.  Assessment/Plan Vertigo This seems to be new problem for Patient  Is not having any symptoms today Can be late effect of his h/o CVA He is on Coumadin Will continue to follow Will start him on Meclizine 12.5 mg Q 8 prn for Dizziness Will check CBC and CMP COPD Has rales on Exam today Will restart on Duo Nebs BID for 3 days and then  PRN On Symbicort and SPiriva  Family/ staff Communication:   Labs/tests ordered:  CMP and CBC  Total time spent in this patient care encounter was  _25  minutes; greater than 50% of the visit spent counseling patient and staff, reviewing records , Labs and coordinating care for problems addressed at this encounter.

## 2018-10-26 ENCOUNTER — Encounter (HOSPITAL_COMMUNITY)
Admission: RE | Admit: 2018-10-26 | Discharge: 2018-10-26 | Disposition: A | Discharge status: Home / Self Care | Payer: Medicare Other | Source: Skilled Nursing Facility | Attending: Internal Medicine | Admitting: Internal Medicine

## 2018-10-26 DIAGNOSIS — E785 Hyperlipidemia, unspecified: Secondary | ICD-10-CM | POA: Diagnosis not present

## 2018-10-26 DIAGNOSIS — K649 Unspecified hemorrhoids: Secondary | ICD-10-CM | POA: Diagnosis not present

## 2018-10-26 DIAGNOSIS — R1312 Dysphagia, oropharyngeal phase: Secondary | ICD-10-CM | POA: Diagnosis not present

## 2018-10-26 DIAGNOSIS — I10 Essential (primary) hypertension: Secondary | ICD-10-CM | POA: Diagnosis not present

## 2018-10-26 DIAGNOSIS — K5900 Constipation, unspecified: Secondary | ICD-10-CM | POA: Diagnosis not present

## 2018-10-26 DIAGNOSIS — R0981 Nasal congestion: Secondary | ICD-10-CM | POA: Insufficient documentation

## 2018-10-26 DIAGNOSIS — H179 Unspecified corneal scar and opacity: Secondary | ICD-10-CM | POA: Diagnosis not present

## 2018-10-26 LAB — CBC WITH DIFFERENTIAL/PLATELET
Abs Immature Granulocytes: 0.02 10*3/uL (ref 0.00–0.07)
Basophils Absolute: 0 10*3/uL (ref 0.0–0.1)
Basophils Relative: 0 %
Eosinophils Absolute: 0.4 10*3/uL (ref 0.0–0.5)
Eosinophils Relative: 4 %
HCT: 49.2 % (ref 39.0–52.0)
Hemoglobin: 15.7 g/dL (ref 13.0–17.0)
Immature Granulocytes: 0 %
Lymphocytes Relative: 26 %
Lymphs Abs: 2.3 10*3/uL (ref 0.7–4.0)
MCH: 30.5 pg (ref 26.0–34.0)
MCHC: 31.9 g/dL (ref 30.0–36.0)
MCV: 95.7 fL (ref 80.0–100.0)
Monocytes Absolute: 0.7 10*3/uL (ref 0.1–1.0)
Monocytes Relative: 8 %
Neutro Abs: 5.4 10*3/uL (ref 1.7–7.7)
Neutrophils Relative %: 62 %
Platelets: 240 10*3/uL (ref 150–400)
RBC: 5.14 MIL/uL (ref 4.22–5.81)
RDW: 13.2 % (ref 11.5–15.5)
WBC: 8.8 10*3/uL (ref 4.0–10.5)
nRBC: 0 % (ref 0.0–0.2)

## 2018-10-26 LAB — BASIC METABOLIC PANEL
Anion gap: 10 (ref 5–15)
BUN: 25 mg/dL — ABNORMAL HIGH (ref 8–23)
CO2: 28 mmol/L (ref 22–32)
Calcium: 8.9 mg/dL (ref 8.9–10.3)
Chloride: 103 mmol/L (ref 98–111)
Creatinine, Ser: 1.05 mg/dL (ref 0.61–1.24)
GFR calc Af Amer: >60 mL/min (ref >60)
GFR calc non Af Amer: >60 mL/min (ref >60)
Glucose, Bld: 74 mg/dL (ref 70–99)
Potassium: 3.9 mmol/L (ref 3.5–5.1)
Sodium: 141 mmol/L (ref 135–145)

## 2018-10-26 LAB — PROTIME-INR
INR: 2.1 — ABNORMAL HIGH (ref 0.8–1.2)
Prothrombin Time: 22.8 seconds — ABNORMAL HIGH (ref 11.4–15.2)

## 2018-11-01 DIAGNOSIS — Z1211 Encounter for screening for malignant neoplasm of colon: Secondary | ICD-10-CM | POA: Diagnosis not present

## 2018-11-15 ENCOUNTER — Non-Acute Institutional Stay (SKILLED_NURSING_FACILITY): Payer: Medicare Other | Admitting: Adult Health

## 2018-11-15 ENCOUNTER — Encounter: Payer: Self-pay | Admitting: Adult Health

## 2018-11-15 DIAGNOSIS — I82502 Chronic embolism and thrombosis of unspecified deep veins of left lower extremity: Secondary | ICD-10-CM

## 2018-11-15 DIAGNOSIS — G8191 Hemiplegia, unspecified affecting right dominant side: Secondary | ICD-10-CM

## 2018-11-15 DIAGNOSIS — I10 Essential (primary) hypertension: Secondary | ICD-10-CM

## 2018-11-15 DIAGNOSIS — I6389 Other cerebral infarction: Secondary | ICD-10-CM | POA: Diagnosis not present

## 2018-11-15 DIAGNOSIS — I679 Cerebrovascular disease, unspecified: Secondary | ICD-10-CM

## 2018-11-15 NOTE — Progress Notes (Signed)
Location:   [facility] Room Number: [address] Place of Service:      CODE STATUS: Full Code  Allergies  Allergen Reactions  . Penicillins     Has patient had a PCN reaction causing immediate rash, facial/tongue/throat swelling, SOB or lightheadedness with hypotension: unknown Has patient had a PCN reaction causing severe rash involving mucus membranes or skin necrosis: unknown Has patient had a PCN reaction that required hospitalization: unknown Has patient had a PCN reaction occurring within the last 10 years: unknown If all of the above answers are "NO", then may proceed with Cephalosporin use. 5/3 Tolerated Cefepime x 1 dose in ED.     Chief Complaint  Patient presents with  . 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     HPI:  He is a 73 year old long term resident of this facility being seen for the mangement of his chronic illnesses: cva; hemiplegia; dvt; hypertension. He denies any chest pain; no vision changes; no uncontrolled pain; there are no reports of fevers present   Past Medical History:  Diagnosis Date  . Dysphasia   . Fatty liver   . Hemiplegia (HCC)   . Hypertension   . Pneumonia   . Pulmonary embolism (HCC)   . 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(HCC)   . Stroke [facility])     Past Surgical History:  Procedure Laterality Date  . FLEXIBLE SIGMOIDOSCOPY N/A 10/21/2016   Procedure: FLEXIBLE SIGMOIDOSCOPY;  Surgeon: Malissa Hippo, MD;  [facility];  Service: Endoscopy;  Laterality: N/A;  . FLEXIBLE SIGMOIDOSCOPY N/A 10/25/2016   Procedure: FLEXIBLE SIGMOIDOSCOPY;  Surgeon: Malissa Hippo, MD;  Location: [facility];  Service: Endoscopy;  Laterality: N/A;  . unable      Social History   Socioeconomic History  .  Marital status: Single    Spouse name: Not on file  . Number of children: Not on file  . Years of education: Not on file  . Highest education level: Not on file  Occupational History  . Not on file  Social Needs  . Financial resource strain: Not hard at all  . Food insecurity:    Worry: Never true    Inability: Never true  . Transportation needs:    Medical: No    Non-medical: No  Tobacco Use  . Smoking status: Never Smoker  . Smokeless tobacco: Never Used  Substance and Sexual Activity  . Alcohol use: No    Alcohol/week: 0.0 standard drinks  . Drug use: No  . Sexual activity: Not on file  Lifestyle  . Physical activity:    Days per week: 0 days    Minutes per session: 0 min  . Stress: Not at all  Relationships  . Social connections:    Talks on phone: Never    Gets together: Once a week    Attends religious service: Never    Active member of club or organization: No    Attends meetings of clubs or organizations: Never    Relationship status: Never married  . Intimate partner violence:    Fear of current or ex partner: No    Emotionally abused: No    Physically abused: No    Forced sexual activity: No  Other Topics Concern  . Not on file  Social  History Narrative  . Not on file   History reviewed. No pertinent family history.    VITAL SIGNS BP 104/68   Pulse 78   Temp (!) 97.4 F (36.3 C)   Resp 20   Ht 5' 8.5" (1.74 m)   Wt 218 lb 3.2 oz (99 kg)   BMI 32.69 kg/m   Outpatient Encounter Medications as of 11/15/2018  Medication Sig  . acetaminophen (TYLENOL) 325 MG tablet Take 650 mg by mouth every 6 (six) hours as needed for moderate pain.   Marland Kitchen. atorvastatin (LIPITOR) 10 MG tablet Take 10 mg by mouth daily.  . budesonide-formoterol (SYMBICORT) 80-4.5 MCG/ACT inhaler Inhale 2 puffs into the lungs 2 (two) times daily.  . carboxymethylcellulose (REFRESH PLUS) 0.5 % SOLN Place 1 drop into the right eye 3 (three) times daily.   Marland Kitchen. erythromycin ophthalmic  ointment Place into the left eye. appy 1 ribbon ophthalmic (eye) 3 times a day  . hydrochlorothiazide (HYDRODIURIL) 12.5 MG tablet Take 12.5 mg by mouth daily.  Marland Kitchen. ipratropium-albuterol (DUONEB) 0.5-2.5 (3) MG/3ML SOLN Take 3 mLs by nebulization every 6 (six) hours as needed.  . labetalol (NORMODYNE) 200 MG tablet Take 200 mg by mouth daily. For HTN  . loratadine (CLARITIN) 10 MG tablet Take 10 mg by mouth daily.  . meclizine (ANTIVERT) 12.5 MG tablet Take 12.5 mg by mouth every 8 (eight) hours as needed for dizziness.  . NON FORMULARY Diet Type:  Dysphagia 1 diet with honey thick liquids, NAS  . sennosides-docusate sodium (SENOKOT-S) 8.6-50 MG tablet Take 1 tablet by mouth at bedtime. For constipation  . umeclidinium bromide (INCRUSE ELLIPTA) 62.5 MCG/INH AEPB Inhale 1 puff into the lungs daily.  . valsartan (DIOVAN) 80 MG tablet Take 80 mg by mouth daily.  Marland Kitchen. warfarin (COUMADIN) 4 MG tablet Take 4 mg by mouth daily.   . [DISCONTINUED] shark liver oil-cocoa butter (HEMORRHOIDAL) 0.25-88.44 % suppository Place 1 suppository rectally at bedtime as needed for hemorrhoids.   No facility-administered encounter medications on file as of 11/15/2018.      SIGNIFICANT DIAGNOSTIC EXAMS  LABS REVIEWED: PREVIOUS   04-24-18: tsh 3.326 free T 4: 0.84 05-04-18: glucose 85; bun 31; creat 1.08 k+ 4.0;  na++ 138 ca 8.6; liver normal albumin 3.4 chol 107; ldl 54; trig 50; hdl 43  06-02-18: glucose 88; bun 25; creat 1.17; k+ 4.0; na++ 137; ca 8.7  07-28-18: tsh 0.299; free T3: 2.8; free T4: 0.92 08-17-18: INR 2.5  09-14-18: INR 2.7 10-05-18: INR 3.3 10-12-18: INR 1.9   LABS REVIEWED TODAY:   10-19-18: INR 2.9 10-26-18: wbc 8.8; hgb 15.7; hct 49.2; mcv 95.7; plt 240; glucose 74; bun 25; creat 1.05; k+ 3.9; na++ 141; ca 8.9 INR 2.1    Review of Systems  Constitutional: Negative for malaise/fatigue.  Respiratory: Negative for cough and shortness of breath.   Cardiovascular: Negative for chest pain, palpitations  and leg swelling.  Gastrointestinal: Negative for abdominal pain, constipation and heartburn.  Musculoskeletal: Negative for back pain, joint pain and myalgias.  Skin: Negative.   Neurological: Negative for dizziness.  Psychiatric/Behavioral: The patient is not nervous/anxious.     Physical Exam Nursing note reviewed.  Constitutional:      General: He is not in acute distress.    Appearance: He is well-developed. He is obese. He is not diaphoretic.  Neck:     Musculoskeletal: Neck supple.     Thyroid: No thyromegaly.  Cardiovascular:     Rate and Rhythm: Normal rate and  regular rhythm.     Pulses: Normal pulses.     Heart sounds: Normal heart sounds.  Pulmonary:     Effort: Pulmonary effort is normal. No respiratory distress.     Breath sounds: Normal breath sounds.  Abdominal:     General: Bowel sounds are normal. There is no distension.     Palpations: Abdomen is soft.     Tenderness: There is no abdominal tenderness.  Musculoskeletal:     Right lower leg: No edema.     Left lower leg: No edema.     Comments: Right hemiplegia Is able to move left extremities     Lymphadenopathy:     Cervical: No cervical adenopathy.  Skin:    General: Skin is warm and dry.     Comments: Bilateral lower extremities discolored   Neurological:     Mental Status: He is alert. Mental status is at baseline.  Psychiatric:        Mood and Affect: Mood normal.     ASSESSMENT/ PLAN:  TODAY:   1. Deep vein thrombosis (DVT) of left lower extremity unspecified vein/other chronic pulmonary embolism without acute cor pulmonale: is stable is on chronic coumadin therapy monitored by Dr. Chales Abrahams.   2. Essential hypertension: is stable b/p 104/68 will continue diovan 80 mg daily hctz 12.5 mg daily   3. 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  Dyslipidemia: is stable LDL 54 will continue lipitor 10 mg daily   5. Hyperthyroidism: is stable tsh 0.299; free T3: 2.8; free T4: 0.92 will monitor   6. Simple other chronic bronchitis: is stable will continue sumbicort 80/4,5 mcg 2 puffs twice daily claritin 10 mg daily incruse ellipta 62.5 mcg daily and has duoneb every 6 hours as needed  7. Sigmoid volvulus: is stable will continue senna s daily   8. 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        MD is aware of resident's narcotic use and is in agreement with current plan of care. We will attempt to wean resident as apropriate   Synthia Innocent NP [facility]  Contact [phone] Monday through Friday 8am- 5pm  After hours call [phone]

## 2018-11-22 DIAGNOSIS — R279 Unspecified lack of coordination: Secondary | ICD-10-CM | POA: Diagnosis not present

## 2018-11-22 DIAGNOSIS — I10 Essential (primary) hypertension: Secondary | ICD-10-CM | POA: Diagnosis not present

## 2018-11-22 DIAGNOSIS — G8191 Hemiplegia, unspecified affecting right dominant side: Secondary | ICD-10-CM | POA: Diagnosis not present

## 2018-11-22 DIAGNOSIS — M6281 Muscle weakness (generalized): Secondary | ICD-10-CM | POA: Diagnosis not present

## 2018-11-23 DIAGNOSIS — G8191 Hemiplegia, unspecified affecting right dominant side: Secondary | ICD-10-CM | POA: Diagnosis not present

## 2018-11-23 DIAGNOSIS — I10 Essential (primary) hypertension: Secondary | ICD-10-CM | POA: Diagnosis not present

## 2018-11-23 DIAGNOSIS — M6281 Muscle weakness (generalized): Secondary | ICD-10-CM | POA: Diagnosis not present

## 2018-11-23 DIAGNOSIS — R279 Unspecified lack of coordination: Secondary | ICD-10-CM | POA: Diagnosis not present

## 2018-11-24 DIAGNOSIS — G8191 Hemiplegia, unspecified affecting right dominant side: Secondary | ICD-10-CM | POA: Diagnosis not present

## 2018-11-24 DIAGNOSIS — R279 Unspecified lack of coordination: Secondary | ICD-10-CM | POA: Diagnosis not present

## 2018-11-24 DIAGNOSIS — I10 Essential (primary) hypertension: Secondary | ICD-10-CM | POA: Diagnosis not present

## 2018-11-24 DIAGNOSIS — M6281 Muscle weakness (generalized): Secondary | ICD-10-CM | POA: Diagnosis not present

## 2018-11-27 ENCOUNTER — Non-Acute Institutional Stay (SKILLED_NURSING_FACILITY): Payer: Medicare Other | Admitting: Adult Health

## 2018-11-27 ENCOUNTER — Telehealth: Payer: Self-pay | Admitting: "Endocrinology

## 2018-11-27 ENCOUNTER — Encounter (HOSPITAL_COMMUNITY)
Admission: RE | Admit: 2018-11-27 | Discharge: 2018-11-27 | Disposition: A | Discharge status: Home / Self Care | Payer: Medicare Other | Source: Skilled Nursing Facility | Attending: Internal Medicine | Admitting: Internal Medicine

## 2018-11-27 ENCOUNTER — Encounter: Payer: Self-pay | Admitting: Adult Health

## 2018-11-27 DIAGNOSIS — Z7901 Long term (current) use of anticoagulants: Secondary | ICD-10-CM | POA: Insufficient documentation

## 2018-11-27 DIAGNOSIS — I10 Essential (primary) hypertension: Secondary | ICD-10-CM | POA: Diagnosis not present

## 2018-11-27 DIAGNOSIS — R1312 Dysphagia, oropharyngeal phase: Secondary | ICD-10-CM | POA: Insufficient documentation

## 2018-11-27 DIAGNOSIS — I69328 Other speech and language deficits following cerebral infarction: Secondary | ICD-10-CM | POA: Insufficient documentation

## 2018-11-27 DIAGNOSIS — E0590 Thyrotoxicosis, unspecified without thyrotoxic crisis or storm: Secondary | ICD-10-CM | POA: Diagnosis not present

## 2018-11-27 DIAGNOSIS — M6281 Muscle weakness (generalized): Secondary | ICD-10-CM | POA: Insufficient documentation

## 2018-11-27 DIAGNOSIS — K562 Volvulus: Secondary | ICD-10-CM | POA: Insufficient documentation

## 2018-11-27 LAB — TSH: TSH: 0.051 u[IU]/mL — ABNORMAL LOW (ref 0.350–4.500)

## 2018-11-27 LAB — PROTIME-INR
INR: 2.7 — ABNORMAL HIGH (ref 0.8–1.2)
Prothrombin Time: 28.2 seconds — ABNORMAL HIGH (ref 11.4–15.2)

## 2018-11-27 LAB — T4, FREE: Free T4: 1.16 ng/dL (ref 0.82–1.77)

## 2018-11-27 NOTE — Telephone Encounter (Signed)
Bard Herbert, NP at [facility] would like to discuss TSH [phone]

## 2018-11-27 NOTE — Telephone Encounter (Signed)
Notified. 

## 2018-11-27 NOTE — Progress Notes (Signed)
Location:   [place] Room Number: [place] of Service:  SNF (31)   CODE STATUS: Full Code  Allergies  Allergen Reactions  . Penicillins     Has patient had a PCN reaction causing immediate rash, facial/tongue/throat swelling, SOB or lightheadedness with hypotension: unknown Has patient had a PCN reaction causing severe rash involving mucus membranes or skin necrosis: unknown Has patient had a PCN reaction that required hospitalization: unknown Has patient had a PCN reaction occurring within the last 10 years: unknown If all of the above answers are "NO", then may proceed with Cephalosporin use. 5/3 Tolerated Cefepime x 1 dose in ED.     Chief Complaint  Patient presents with  . Acute Visit    Thyroid    HPI:  His TSH is low at 0.051; which is lower than his previous labs. His free hormones are pending. He denies any palpitations; no chest pain; no visual changes. There are no reports of fevers present. I have contacted Dr. Liliane Channel office regarding his results. He had been on tapazole in the past. He has been asymptomatic with his lab results.   Past Medical History:  Diagnosis Date  . Dysphasia   . Fatty liver   . Hemiplegia ([place])   . Hypertension   . Pneumonia   . Pulmonary embolism ([place])   . 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([place])   . Stroke [facility])     Past Surgical History:  Procedure Laterality Date  . FLEXIBLE SIGMOIDOSCOPY N/A 10/21/2016   Procedure: FLEXIBLE SIGMOIDOSCOPY;  Surgeon: Rogene Houston, MD;  [facility];  Service: Endoscopy;  Laterality: N/A;  . FLEXIBLE SIGMOIDOSCOPY N/A 10/25/2016   Procedure: FLEXIBLE SIGMOIDOSCOPY;  Surgeon: Rogene Houston, MD;  [facility];  Service: Endoscopy;  Laterality: N/A;  . unable      Social History   Socioeconomic History  . Marital status: Single    Spouse name: Not on file  . Number of children: Not on file  . Years of education: Not on file  . Highest education level:  Not on file  Occupational History  . Not on file  Social Needs  . Financial resource strain: Not hard at all  . Food insecurity:    Worry: Never true    Inability: Never true  . Transportation needs:    Medical: No    Non-medical: No  Tobacco Use  . Smoking status: Never Smoker  . Smokeless tobacco: Never Used  Substance and Sexual Activity  . Alcohol use: No    Alcohol/week: 0.0 standard drinks  . Drug use: No  . Sexual activity: Not on file  Lifestyle  . Physical activity:    Days per week: 0 days    Minutes per session: 0 min  . Stress: Not at all  Relationships  . Social connections:    Talks on phone: Never    Gets together: Once a week    Attends religious service: Never    Active member of club or organization: No    Attends meetings of clubs or organizations: Never    Relationship status: Never married  . Intimate partner violence:    Fear of current or ex partner: No    Emotionally abused: No    Physically abused: No    Forced sexual activity: No  Other Topics Concern  . Not on file  Social History Narrative  . Not on file   History reviewed. No pertinent family history.  VITAL SIGNS BP 120/78   Pulse 87   Temp (!) 97.4 F (36.3 C)   Ht 5' 8.5" (1.74 m)   Wt 220 lb (99.8 kg)   BMI 32.96 kg/m   Outpatient Encounter Medications as of 11/27/2018  Medication Sig  . acetaminophen (TYLENOL) 325 MG tablet Take 650 mg by mouth every 6 (six) hours as needed for moderate pain.   Marland Kitchen. atorvastatin (LIPITOR) 10 MG tablet Take 10 mg by mouth daily.  . budesonide-formoterol (SYMBICORT) 80-4.5 MCG/ACT inhaler Inhale 2 puffs into the lungs 2 (two) times daily.  . carboxymethylcellulose (REFRESH PLUS) 0.5 % SOLN Place 1 drop into the right eye 3 (three) times daily.   Marland Kitchen. erythromycin ophthalmic ointment Place into the left eye. appy 1 ribbon ophthalmic (eye) 3 times a day  . hydrochlorothiazide (HYDRODIURIL) 12.5 MG tablet Take 12.5 mg by mouth daily.  Marland Kitchen.  ipratropium-albuterol (DUONEB) 0.5-2.5 (3) MG/3ML SOLN Take 3 mLs by nebulization every 6 (six) hours as needed.  . labetalol (NORMODYNE) 200 MG tablet Take 200 mg by mouth daily. For HTN  . loratadine (CLARITIN) 10 MG tablet Take 10 mg by mouth daily.  . meclizine (ANTIVERT) 12.5 MG tablet Take 12.5 mg by mouth every 8 (eight) hours as needed for dizziness.  . NON FORMULARY Diet Type:  Dysphagia 1 diet with honey thick liquids, NAS  . sennosides-docusate sodium (SENOKOT-S) 8.6-50 MG tablet Take 1 tablet by mouth at bedtime. For constipation  . umeclidinium bromide (INCRUSE ELLIPTA) 62.5 MCG/INH AEPB Inhale 1 puff into the lungs daily.  . valsartan (DIOVAN) 80 MG tablet Take 80 mg by mouth daily.  Marland Kitchen. warfarin (COUMADIN) 4 MG tablet Take 4 mg by mouth daily.    No facility-administered encounter medications on file as of 11/27/2018.      SIGNIFICANT DIAGNOSTIC EXAMS  LABS REVIEWED: PREVIOUS   04-24-18: tsh 3.326 free T 4: 0.84 05-04-18: glucose 85; bun 31; creat 1.08 k+ 4.0;  na++ 138 ca 8.6; liver normal albumin 3.4 chol 107; ldl 54; trig 50; hdl 43  06-02-18: glucose 88; bun 25; creat 1.17; k+ 4.0; na++ 137; ca 8.7  07-28-18: tsh 0.299; free T3: 2.8; free T4: 0.92 08-17-18: INR 2.5  09-14-18: INR 2.7 10-05-18: INR 3.3 10-12-18: INR 1.9  10-19-18: INR 2.9 10-26-18: wbc 8.8; hgb 15.7; hct 49.2; mcv 95.7; plt 240; glucose 74; bun 25; creat 1.05; k+ 3.9; na++ 141; ca 8.9 INR 2.1   TODAY:   11-27-18: INR 2.7 tsh 0.051     Review of Systems  Constitutional: Negative for malaise/fatigue.  Respiratory: Negative for cough and shortness of breath.   Cardiovascular: Negative for chest pain, palpitations and leg swelling.  Gastrointestinal: Negative for abdominal pain, constipation and heartburn.  Musculoskeletal: Negative for back pain, joint pain and myalgias.  Skin: Negative.   Neurological: Negative for dizziness.  Psychiatric/Behavioral: The patient is not nervous/anxious.     Physical Exam  Constitutional:      General: He is not in acute distress.    Appearance: He is well-developed. He is obese. He is not diaphoretic.  Neck:     Musculoskeletal: Neck supple.     Thyroid: No thyromegaly.  Cardiovascular:     Rate and Rhythm: Normal rate and regular rhythm.     Pulses: Normal pulses.     Heart sounds: Normal heart sounds.  Pulmonary:     Effort: Pulmonary effort is normal. No respiratory distress.     Breath sounds: Normal breath sounds.  Abdominal:  General: Bowel sounds are normal. There is no distension.     Palpations: Abdomen is soft.     Tenderness: There is no abdominal tenderness.  Musculoskeletal:     Right lower leg: No edema.     Left lower leg: No edema.     Comments: Right hemiplegia Is able to move left extremities     Lymphadenopathy:     Cervical: No cervical adenopathy.  Skin:    General: Skin is warm and dry.     Comments:  Bilateral lower extremities discolored    Neurological:     Mental Status: He is alert. Mental status is at baseline.  Psychiatric:        Mood and Affect: Mood normal.      ASSESSMENT/ PLAN:  TODAY:   1. Hyperthyroidism: is worse: tsh 0.051 I have spoken with DR. Isidoro DonningNida's office: will begin tapazole 5 mg daily and will check labs in 3 weeks.   MD is aware of resident's narcotic use and is in agreement with current plan of care. We will attempt to wean resident as apropriate   Synthia Innocenteborah Alveta Quintela NP [facility]  Contact [phone] Monday through Friday 8am- 5pm  After hours call [phone]

## 2018-11-27 NOTE — Telephone Encounter (Signed)
Please look at labs on [name] [name]. His Tapazole was stopped a couple of months ago.

## 2018-11-27 NOTE — Telephone Encounter (Signed)
He needs to be back on tapazole 5 mg po daily.

## 2018-11-28 DIAGNOSIS — Z20828 Contact with and (suspected) exposure to other viral communicable diseases: Secondary | ICD-10-CM | POA: Diagnosis not present

## 2018-11-28 DIAGNOSIS — I10 Essential (primary) hypertension: Secondary | ICD-10-CM | POA: Diagnosis not present

## 2018-11-28 DIAGNOSIS — G8191 Hemiplegia, unspecified affecting right dominant side: Secondary | ICD-10-CM | POA: Diagnosis not present

## 2018-11-28 DIAGNOSIS — R279 Unspecified lack of coordination: Secondary | ICD-10-CM | POA: Diagnosis not present

## 2018-11-28 DIAGNOSIS — M6281 Muscle weakness (generalized): Secondary | ICD-10-CM | POA: Diagnosis not present

## 2018-11-28 LAB — T3, FREE: T3, Free: 3.2 pg/mL (ref 2.0–4.4)

## 2018-11-29 DIAGNOSIS — R279 Unspecified lack of coordination: Secondary | ICD-10-CM | POA: Diagnosis not present

## 2018-11-29 DIAGNOSIS — M6281 Muscle weakness (generalized): Secondary | ICD-10-CM | POA: Diagnosis not present

## 2018-11-29 DIAGNOSIS — I10 Essential (primary) hypertension: Secondary | ICD-10-CM | POA: Diagnosis not present

## 2018-11-29 DIAGNOSIS — G8191 Hemiplegia, unspecified affecting right dominant side: Secondary | ICD-10-CM | POA: Diagnosis not present

## 2018-11-30 DIAGNOSIS — I10 Essential (primary) hypertension: Secondary | ICD-10-CM | POA: Diagnosis not present

## 2018-11-30 DIAGNOSIS — R279 Unspecified lack of coordination: Secondary | ICD-10-CM | POA: Diagnosis not present

## 2018-11-30 DIAGNOSIS — M6281 Muscle weakness (generalized): Secondary | ICD-10-CM | POA: Diagnosis not present

## 2018-11-30 DIAGNOSIS — G8191 Hemiplegia, unspecified affecting right dominant side: Secondary | ICD-10-CM | POA: Diagnosis not present

## 2018-12-04 DIAGNOSIS — I10 Essential (primary) hypertension: Secondary | ICD-10-CM | POA: Diagnosis not present

## 2018-12-04 DIAGNOSIS — R279 Unspecified lack of coordination: Secondary | ICD-10-CM | POA: Diagnosis not present

## 2018-12-04 DIAGNOSIS — M6281 Muscle weakness (generalized): Secondary | ICD-10-CM | POA: Diagnosis not present

## 2018-12-04 DIAGNOSIS — G8191 Hemiplegia, unspecified affecting right dominant side: Secondary | ICD-10-CM | POA: Diagnosis not present

## 2018-12-05 DIAGNOSIS — G8191 Hemiplegia, unspecified affecting right dominant side: Secondary | ICD-10-CM | POA: Diagnosis not present

## 2018-12-05 DIAGNOSIS — R279 Unspecified lack of coordination: Secondary | ICD-10-CM | POA: Diagnosis not present

## 2018-12-05 DIAGNOSIS — M6281 Muscle weakness (generalized): Secondary | ICD-10-CM | POA: Diagnosis not present

## 2018-12-05 DIAGNOSIS — I10 Essential (primary) hypertension: Secondary | ICD-10-CM | POA: Diagnosis not present

## 2018-12-06 DIAGNOSIS — R279 Unspecified lack of coordination: Secondary | ICD-10-CM | POA: Diagnosis not present

## 2018-12-06 DIAGNOSIS — G8191 Hemiplegia, unspecified affecting right dominant side: Secondary | ICD-10-CM | POA: Diagnosis not present

## 2018-12-06 DIAGNOSIS — I10 Essential (primary) hypertension: Secondary | ICD-10-CM | POA: Diagnosis not present

## 2018-12-06 DIAGNOSIS — M6281 Muscle weakness (generalized): Secondary | ICD-10-CM | POA: Diagnosis not present

## 2018-12-07 ENCOUNTER — Ambulatory Visit: Payer: Medicare Other | Admitting: "Endocrinology

## 2018-12-07 DIAGNOSIS — G8191 Hemiplegia, unspecified affecting right dominant side: Secondary | ICD-10-CM | POA: Diagnosis not present

## 2018-12-07 DIAGNOSIS — I10 Essential (primary) hypertension: Secondary | ICD-10-CM | POA: Diagnosis not present

## 2018-12-07 DIAGNOSIS — R279 Unspecified lack of coordination: Secondary | ICD-10-CM | POA: Diagnosis not present

## 2018-12-07 DIAGNOSIS — M6281 Muscle weakness (generalized): Secondary | ICD-10-CM | POA: Diagnosis not present

## 2018-12-08 ENCOUNTER — Encounter: Payer: Self-pay | Admitting: Adult Health

## 2018-12-08 ENCOUNTER — Non-Acute Institutional Stay (SKILLED_NURSING_FACILITY): Payer: Medicare Other | Admitting: Adult Health

## 2018-12-08 DIAGNOSIS — E785 Hyperlipidemia, unspecified: Secondary | ICD-10-CM | POA: Diagnosis not present

## 2018-12-08 DIAGNOSIS — J410 Simple chronic bronchitis: Secondary | ICD-10-CM | POA: Diagnosis not present

## 2018-12-08 DIAGNOSIS — E0590 Thyrotoxicosis, unspecified without thyrotoxic crisis or storm: Secondary | ICD-10-CM

## 2018-12-08 NOTE — Progress Notes (Signed)
Location:   [facility] Room Number: 39112 W Place of Service:  SNF (31)   CODE STATUS: Full Code  Allergies  Allergen Reactions  . Penicillins     Has patient had a PCN reaction causing immediate rash, facial/tongue/throat swelling, SOB or lightheadedness with hypotension: unknown Has patient had a PCN reaction causing severe rash involving mucus membranes or skin necrosis: unknown Has patient had a PCN reaction that required hospitalization: unknown Has patient had a PCN reaction occurring within the last 10 years: unknown If all of the above answers are "NO", then may proceed with Cephalosporin use. 5/3 Tolerated Cefepime x 1 dose in ED.     Chief Complaint  Patient presents with  . Medical Management of Chronic Issues       Dyslipidemia; Hyperthyroidism: Simple other chronic bronchitis     HPI:  He is a long term resident of this facility being seen for the management of his chronic illnesses: dyslipidemia; hyperthyroidism; bronchitis. He denies any cough or shortness of breath. He denies any palpitations; no changes in his appetite. There are no reports of fevers present.   Past Medical History:  Diagnosis Date  . Dysphasia   . Fatty liver   . Hemiplegia (HCC)   . Hypertension   . Pneumonia   . Pulmonary embolism (HCC)   . 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(HCC)   . Stroke [facility](HCC)     Past Surgical History:  Procedure Laterality Date  . FLEXIBLE SIGMOIDOSCOPY N/A 10/21/2016   Procedure: FLEXIBLE SIGMOIDOSCOPY;  Surgeon: Malissa Hippoehman, Najeeb U, MD;  Location: [facility];  Service: Endoscopy;  Laterality: N/A;  . FLEXIBLE SIGMOIDOSCOPY N/A 10/25/2016   Procedure: FLEXIBLE SIGMOIDOSCOPY;  Surgeon: Malissa Hippoehman, Najeeb U, MD;  Location: [facility];  Service: Endoscopy;  Laterality: N/A;  . unable      Social History   Socioeconomic History  . Marital status: Single    Spouse name: Not on file  . Number of children: Not on file  . Years of education: Not on file   . Highest education level: Not on file  Occupational History  . Not on file  Social Needs  . Financial resource strain: Not hard at all  . Food insecurity    Worry: Never true    Inability: Never true  . Transportation needs    Medical: No    Non-medical: No  Tobacco Use  . Smoking status: Never Smoker  . Smokeless tobacco: Never Used  Substance and Sexual Activity  . Alcohol use: No    Alcohol/week: 0.0 standard drinks  . Drug use: No  . Sexual activity: Not on file  Lifestyle  . Physical activity    Days per week: 0 days    Minutes per session: 0 min  . Stress: Not at all  Relationships  . Social Musicianconnections    Talks on phone: Never    Gets together: Once a week    Attends religious service: Never    Active member of club or organization: No    Attends meetings of clubs or organizations: Never    Relationship status: Never married  . Intimate partner violence    Fear of current or ex partner: No    Emotionally abused: No    Physically abused: No    Forced sexual activity: No  Other Topics Concern  . Not on file  Social History Narrative  . Not on file   History reviewed. No pertinent family history.  VITAL SIGNS BP 101/78   Pulse 76   Temp 98.1 F (36.7 C)   Resp 15   Ht 5' 8.5" (1.74 m)   Wt 220 lb (99.8 kg)   BMI 32.96 kg/m   Outpatient Encounter Medications as of 12/08/2018  Medication Sig  . acetaminophen (TYLENOL) 325 MG tablet Take 650 mg by mouth every 6 (six) hours as needed for moderate pain.   Marland Kitchen. atorvastatin (LIPITOR) 10 MG tablet Take 10 mg by mouth daily.  . budesonide-formoterol (SYMBICORT) 80-4.5 MCG/ACT inhaler Inhale 2 puffs into the lungs 2 (two) times daily.  . carboxymethylcellulose (REFRESH PLUS) 0.5 % SOLN Place 1 drop into the right eye 3 (three) times daily.   Marland Kitchen. erythromycin ophthalmic ointment Place into the left eye. appy 1 ribbon ophthalmic (eye) 3 times a day  . hydrochlorothiazide (HYDRODIURIL) 12.5 MG tablet Take 12.5 mg  by mouth daily.  Marland Kitchen. ipratropium-albuterol (DUONEB) 0.5-2.5 (3) MG/3ML SOLN Take 3 mLs by nebulization every 6 (six) hours as needed.  . labetalol (NORMODYNE) 200 MG tablet Take 200 mg by mouth daily. For HTN  . loratadine (CLARITIN) 10 MG tablet Take 10 mg by mouth daily.  . meclizine (ANTIVERT) 12.5 MG tablet Take 12.5 mg by mouth every 8 (eight) hours as needed for dizziness.  . methimazole (TAPAZOLE) 5 MG tablet Take 5 mg by mouth daily.  . NON FORMULARY Diet Type:  Dysphagia 1 diet with honey thick liquids, NAS  . sennosides-docusate sodium (SENOKOT-S) 8.6-50 MG tablet Take 1 tablet by mouth at bedtime. For constipation  . umeclidinium bromide (INCRUSE ELLIPTA) 62.5 MCG/INH AEPB Inhale 1 puff into the lungs daily.  . valsartan (DIOVAN) 80 MG tablet Take 80 mg by mouth daily.  Marland Kitchen. warfarin (COUMADIN) 4 MG tablet Take 4 mg by mouth daily.    No facility-administered encounter medications on file as of 12/08/2018.      SIGNIFICANT DIAGNOSTIC EXAMS  LABS REVIEWED: PREVIOUS   04-24-18: tsh 3.326 free T 4: 0.84 05-04-18: glucose 85; bun 31; creat 1.08 k+ 4.0;  na++ 138 ca 8.6; liver normal albumin 3.4 chol 107; ldl 54; trig 50; hdl 43  06-02-18: glucose 88; bun 25; creat 1.17; k+ 4.0; na++ 137; ca 8.7  07-28-18: tsh 0.299; free T3: 2.8; free T4: 0.92 08-17-18: INR 2.5  09-14-18: INR 2.7 10-05-18: INR 3.3 10-12-18: INR 1.9  10-19-18: INR 2.9 10-26-18: wbc 8.8; hgb 15.7; hct 49.2; mcv 95.7; plt 240; glucose 74; bun 25; creat 1.05; k+ 3.9; na++ 141; ca 8.9 INR 2.1  11-27-18: INR 2.7 tsh 0.051 free T3: 3.2; free T4: 1.16  NO NEW LABS.     Review of Systems  Constitutional: Negative for malaise/fatigue.  Respiratory: Negative for cough and shortness of breath.   Cardiovascular: Negative for chest pain, palpitations and leg swelling.  Gastrointestinal: Negative for abdominal pain, constipation and heartburn.  Musculoskeletal: Negative for back pain, joint pain and myalgias.  Skin: Negative.    Neurological: Negative for dizziness.  Psychiatric/Behavioral: The patient is not nervous/anxious.       Physical Exam Constitutional:      General: He is not in acute distress.    Appearance: He is well-developed. He is obese. He is not diaphoretic.  Neck:     Musculoskeletal: Neck supple.     Thyroid: No thyromegaly.  Cardiovascular:     Rate and Rhythm: Normal rate and regular rhythm.     Pulses: Normal pulses.     Heart sounds: Normal heart sounds.  Pulmonary:  Effort: Pulmonary effort is normal. No respiratory distress.     Breath sounds: Normal breath sounds.  Abdominal:     General: Bowel sounds are normal. There is no distension.     Palpations: Abdomen is soft.     Tenderness: There is no abdominal tenderness.  Musculoskeletal:     Right lower leg: No edema.     Left lower leg: No edema.     Comments:  Right hemiplegia Is able to move left extremities      Lymphadenopathy:     Cervical: No cervical adenopathy.  Skin:    General: Skin is warm and dry.     Comments: Bilateral lower extremities discolored   Neurological:     Mental Status: He is alert. Mental status is at baseline.  Psychiatric:        Mood and Affect: Mood normal.      ASSESSMENT/ PLAN:  TODAY:   1. Dyslipidemia; is stable LDL 54 will continue lipitor 10 mg daily   2. Hyperthyroidism: is stable tsh 0.051 free t3: 3.2 free t4: 1.16 will continue tapzole 5 mg daily   3. Simple other chronic bronchitis is stable will continue symbicot 80/4.5 mcg 2 puffs twice daily; claritin 10 mg daily incruse ellipta 62.5 mcg daily and has duoneb every 6 hours as needed.   PREVIOUS  4. Sigmoid volvulus: is stable will continue senna s daily   5. 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   6. Deep vein thrombosis (DVT) of left lower extremity unspecified vein/other chronic pulmonary embolism without acute cor  pulmonale: is stable is on chronic coumadin therapy monitored by Dr. Lyndel Safe.   7. Essential hypertension: is stable b/p 101/78 will continue diovan 80 mg daily hctz 12.5 mg daily   8. 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 We will attempt to wean resident as apropriate   Ok Edwards NP [facility]  Contact [phone] Monday through Friday 8am- 5pm  After hours call [phone]

## 2018-12-11 ENCOUNTER — Encounter (HOSPITAL_COMMUNITY)
Admission: AD | Admit: 2018-12-11 | Discharge: 2018-12-11 | Disposition: A | Discharge status: Home / Self Care | Payer: Medicare Other | Source: Ambulatory Visit

## 2018-12-11 DIAGNOSIS — M6281 Muscle weakness (generalized): Secondary | ICD-10-CM | POA: Diagnosis not present

## 2018-12-11 DIAGNOSIS — K562 Volvulus: Secondary | ICD-10-CM | POA: Diagnosis not present

## 2018-12-11 DIAGNOSIS — I69328 Other speech and language deficits following cerebral infarction: Secondary | ICD-10-CM | POA: Diagnosis not present

## 2018-12-11 DIAGNOSIS — I10 Essential (primary) hypertension: Secondary | ICD-10-CM | POA: Diagnosis not present

## 2018-12-11 DIAGNOSIS — G8191 Hemiplegia, unspecified affecting right dominant side: Secondary | ICD-10-CM | POA: Diagnosis not present

## 2018-12-11 DIAGNOSIS — R279 Unspecified lack of coordination: Secondary | ICD-10-CM | POA: Diagnosis not present

## 2018-12-11 DIAGNOSIS — R1312 Dysphagia, oropharyngeal phase: Secondary | ICD-10-CM | POA: Diagnosis not present

## 2018-12-11 DIAGNOSIS — Z7901 Long term (current) use of anticoagulants: Secondary | ICD-10-CM | POA: Diagnosis not present

## 2018-12-11 LAB — PROTIME-INR
INR: 3.3 — ABNORMAL HIGH (ref 0.8–1.2)
Prothrombin Time: 32.7 seconds — ABNORMAL HIGH (ref 11.4–15.2)

## 2018-12-12 DIAGNOSIS — M6281 Muscle weakness (generalized): Secondary | ICD-10-CM | POA: Diagnosis not present

## 2018-12-12 DIAGNOSIS — G8191 Hemiplegia, unspecified affecting right dominant side: Secondary | ICD-10-CM | POA: Diagnosis not present

## 2018-12-12 DIAGNOSIS — R279 Unspecified lack of coordination: Secondary | ICD-10-CM | POA: Diagnosis not present

## 2018-12-12 DIAGNOSIS — I10 Essential (primary) hypertension: Secondary | ICD-10-CM | POA: Diagnosis not present

## 2018-12-13 ENCOUNTER — Other Ambulatory Visit (HOSPITAL_COMMUNITY)
Admission: RE | Admit: 2018-12-13 | Discharge: 2018-12-13 | Disposition: A | Discharge status: Home / Self Care | Payer: Medicare Other | Source: Skilled Nursing Facility | Attending: Internal Medicine | Admitting: Internal Medicine

## 2018-12-13 DIAGNOSIS — M6281 Muscle weakness (generalized): Secondary | ICD-10-CM | POA: Diagnosis not present

## 2018-12-13 DIAGNOSIS — Z7901 Long term (current) use of anticoagulants: Secondary | ICD-10-CM | POA: Diagnosis not present

## 2018-12-13 DIAGNOSIS — I10 Essential (primary) hypertension: Secondary | ICD-10-CM | POA: Diagnosis not present

## 2018-12-13 DIAGNOSIS — R279 Unspecified lack of coordination: Secondary | ICD-10-CM | POA: Diagnosis not present

## 2018-12-13 DIAGNOSIS — G8191 Hemiplegia, unspecified affecting right dominant side: Secondary | ICD-10-CM | POA: Diagnosis not present

## 2018-12-13 LAB — PROTIME-INR
INR: 2.7 — ABNORMAL HIGH (ref 0.8–1.2)
Prothrombin Time: 28.3 seconds — ABNORMAL HIGH (ref 11.4–15.2)

## 2018-12-14 DIAGNOSIS — R279 Unspecified lack of coordination: Secondary | ICD-10-CM | POA: Diagnosis not present

## 2018-12-14 DIAGNOSIS — M6281 Muscle weakness (generalized): Secondary | ICD-10-CM | POA: Diagnosis not present

## 2018-12-14 DIAGNOSIS — G8191 Hemiplegia, unspecified affecting right dominant side: Secondary | ICD-10-CM | POA: Diagnosis not present

## 2018-12-14 DIAGNOSIS — I10 Essential (primary) hypertension: Secondary | ICD-10-CM | POA: Diagnosis not present

## 2018-12-15 DIAGNOSIS — G8191 Hemiplegia, unspecified affecting right dominant side: Secondary | ICD-10-CM | POA: Diagnosis not present

## 2018-12-15 DIAGNOSIS — R279 Unspecified lack of coordination: Secondary | ICD-10-CM | POA: Diagnosis not present

## 2018-12-15 DIAGNOSIS — M6281 Muscle weakness (generalized): Secondary | ICD-10-CM | POA: Diagnosis not present

## 2018-12-15 DIAGNOSIS — I10 Essential (primary) hypertension: Secondary | ICD-10-CM | POA: Diagnosis not present

## 2018-12-18 DIAGNOSIS — M6281 Muscle weakness (generalized): Secondary | ICD-10-CM | POA: Diagnosis not present

## 2018-12-18 DIAGNOSIS — I10 Essential (primary) hypertension: Secondary | ICD-10-CM | POA: Diagnosis not present

## 2018-12-18 DIAGNOSIS — R279 Unspecified lack of coordination: Secondary | ICD-10-CM | POA: Diagnosis not present

## 2018-12-18 DIAGNOSIS — G8191 Hemiplegia, unspecified affecting right dominant side: Secondary | ICD-10-CM | POA: Diagnosis not present

## 2018-12-19 DIAGNOSIS — R279 Unspecified lack of coordination: Secondary | ICD-10-CM | POA: Diagnosis not present

## 2018-12-19 DIAGNOSIS — I10 Essential (primary) hypertension: Secondary | ICD-10-CM | POA: Diagnosis not present

## 2018-12-19 DIAGNOSIS — M6281 Muscle weakness (generalized): Secondary | ICD-10-CM | POA: Diagnosis not present

## 2018-12-19 DIAGNOSIS — G8191 Hemiplegia, unspecified affecting right dominant side: Secondary | ICD-10-CM | POA: Diagnosis not present

## 2018-12-20 ENCOUNTER — Other Ambulatory Visit (HOSPITAL_COMMUNITY)
Admission: RE | Admit: 2018-12-20 | Discharge: 2018-12-20 | Disposition: A | Discharge status: Home / Self Care | Payer: Medicare Other | Source: Skilled Nursing Facility | Attending: Adult Health | Admitting: Adult Health

## 2018-12-20 ENCOUNTER — Encounter: Payer: Self-pay | Admitting: Adult Health

## 2018-12-20 ENCOUNTER — Non-Acute Institutional Stay (SKILLED_NURSING_FACILITY): Payer: Medicare Other | Admitting: Adult Health

## 2018-12-20 DIAGNOSIS — I10 Essential (primary) hypertension: Secondary | ICD-10-CM | POA: Diagnosis not present

## 2018-12-20 DIAGNOSIS — I82502 Chronic embolism and thrombosis of unspecified deep veins of left lower extremity: Secondary | ICD-10-CM | POA: Diagnosis not present

## 2018-12-20 DIAGNOSIS — I2782 Chronic pulmonary embolism: Secondary | ICD-10-CM | POA: Diagnosis not present

## 2018-12-20 DIAGNOSIS — I6389 Other cerebral infarction: Secondary | ICD-10-CM | POA: Diagnosis not present

## 2018-12-20 DIAGNOSIS — Z7901 Long term (current) use of anticoagulants: Secondary | ICD-10-CM | POA: Diagnosis not present

## 2018-12-20 DIAGNOSIS — E058 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: Secondary | ICD-10-CM | POA: Insufficient documentation

## 2018-12-20 DIAGNOSIS — G8191 Hemiplegia, unspecified affecting right dominant side: Secondary | ICD-10-CM | POA: Diagnosis not present

## 2018-12-20 DIAGNOSIS — R279 Unspecified lack of coordination: Secondary | ICD-10-CM | POA: Diagnosis not present

## 2018-12-20 DIAGNOSIS — M6281 Muscle weakness (generalized): Secondary | ICD-10-CM | POA: Diagnosis not present

## 2018-12-20 LAB — T4, FREE: Free T4: 0.9 ng/dL (ref 0.61–1.12)

## 2018-12-20 LAB — PROTIME-INR
INR: 2.4 — ABNORMAL HIGH (ref 0.8–1.2)
Prothrombin Time: 25.5 seconds — ABNORMAL HIGH (ref 11.4–15.2)

## 2018-12-20 LAB — TSH: TSH: 0.444 u[IU]/mL (ref 0.350–4.500)

## 2018-12-20 NOTE — Progress Notes (Signed)
Location:    [facility] Room Number: 112/W Place of Service:  SNF (31)   CODE STATUS: Full Code  Allergies  Allergen Reactions   Penicillins     Has patient had a PCN reaction causing immediate rash, facial/tongue/throat swelling, SOB or lightheadedness with hypotension: unknown Has patient had a PCN reaction causing severe rash involving mucus membranes or skin necrosis: unknown Has patient had a PCN reaction that required hospitalization: unknown Has patient had a PCN reaction occurring within the last 10 years: unknown If all of the above answers are "NO", then may proceed with Cephalosporin use. 5/3 Tolerated Cefepime x 1 dose in ED.     Chief Complaint  Patient presents with   Acute Visit    INR Visit    HPI:  He is on chronic coumadin therapy for: cva; PE; DVT. There are no reports of missed doses. There are no reports of abnormal bleeding present. He denies any uncontrolled pain. He denies any fevers present. He is tolerating his medication easily.   Past Medical History:  Diagnosis Date   Dysphasia    Fatty liver    Hemiplegia ([place])    Hypertension    Pneumonia    Pulmonary embolism ([place])    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([place])    Stroke [facility])     Past Surgical History:  Procedure Laterality Date   FLEXIBLE SIGMOIDOSCOPY N/A 10/21/2016   Procedure: FLEXIBLE SIGMOIDOSCOPY;  Surgeon: Rogene Houston, MD;  Location[facility];  Service: Endoscopy;  Laterality: N/A;   FLEXIBLE SIGMOIDOSCOPY N/A 10/25/2016   Procedure: FLEXIBLE SIGMOIDOSCOPY;  Surgeon: Rogene Houston, MD;  Location: [facility];  Service: Endoscopy;  Laterality: N/A;   unable      Social History   Socioeconomic History   Marital status: Single    Spouse name: Not on file   Number of children: Not on file   Years of education: Not on file   Highest education level: Not on file  Occupational History   Not on file  Social Needs   Financial resource strain:  Not hard at all   Food insecurity    Worry: Never true    Inability: Never true   Transportation needs    Medical: No    Non-medical: No  Tobacco Use   Smoking status: Never Smoker   Smokeless tobacco: Never Used  Substance and Sexual Activity   Alcohol use: No    Alcohol/week: 0.0 standard drinks   Drug use: No   Sexual activity: Not on file  Lifestyle   Physical activity    Days per week: 0 days    Minutes per session: 0 min   Stress: Not at all  Relationships   Social connections    Talks on phone: Never    Gets together: Once a week    Attends religious service: Never    Active member of club or organization: No    Attends meetings of clubs or organizations: Never    Relationship status: Never married   Intimate partner violence    Fear of current or ex partner: No    Emotionally abused: No    Physically abused: No    Forced sexual activity: No  Other Topics Concern   Not on file  Social History Narrative   Not on file   History reviewed. No pertinent family history.    VITAL SIGNS BP 110/70    Pulse 81    Temp (!) 97.1 F (  36.2 C) (Oral)    Resp 20    Ht 5' 8.5" (1.74 m)    Wt 220 lb (99.8 kg)    SpO2 92%    BMI 32.96 kg/m   Outpatient Encounter Medications as of 12/20/2018  Medication Sig   acetaminophen (TYLENOL) 325 MG tablet Take 650 mg by mouth every 6 (six) hours as needed for moderate pain.    atorvastatin (LIPITOR) 10 MG tablet Take 10 mg by mouth daily.   budesonide-formoterol (SYMBICORT) 80-4.5 MCG/ACT inhaler Inhale 2 puffs into the lungs 2 (two) times daily.   carboxymethylcellulose (REFRESH PLUS) 0.5 % SOLN Place 1 drop into the right eye 3 (three) times daily.    erythromycin ophthalmic ointment Place into the left eye. appy 1 ribbon ophthalmic (eye) 3 times a day   hydrochlorothiazide (HYDRODIURIL) 12.5 MG tablet Take 12.5 mg by mouth daily.   ipratropium-albuterol (DUONEB) 0.5-2.5 (3) MG/3ML SOLN Take 3 mLs by nebulization  every 6 (six) hours as needed.   labetalol (NORMODYNE) 200 MG tablet Take 200 mg by mouth daily. For HTN   loratadine (CLARITIN) 10 MG tablet Take 10 mg by mouth daily.   meclizine (ANTIVERT) 12.5 MG tablet Take 12.5 mg by mouth every 8 (eight) hours as needed for dizziness.   methimazole (TAPAZOLE) 5 MG tablet Take 5 mg by mouth daily.   NON FORMULARY Diet Type:  Dysphagia 1 diet with honey thick liquids, NAS   sennosides-docusate sodium (SENOKOT-S) 8.6-50 MG tablet Take 1 tablet by mouth at bedtime. For constipation   umeclidinium bromide (INCRUSE ELLIPTA) 62.5 MCG/INH AEPB Inhale 1 puff into the lungs daily.   valsartan (DIOVAN) 80 MG tablet Take 80 mg by mouth daily.   warfarin (COUMADIN) 3 MG tablet Take 3 mg by mouth daily. On Mon,Fri   warfarin (COUMADIN) 4 MG tablet Take 4 mg by mouth. Take on Sun, Tues, Wed, Thurs,Sat   No facility-administered encounter medications on file as of 12/20/2018.      SIGNIFICANT DIAGNOSTIC EXAMS   LABS REVIEWED: PREVIOUS   04-24-18: tsh 3.326 free T 4: 0.84 05-04-18: glucose 85; bun 31; creat 1.08 k+ 4.0;  na++ 138 ca 8.6; liver normal albumin 3.4 chol 107; ldl 54; trig 50; hdl 43  06-02-18: glucose 88; bun 25; creat 1.17; k+ 4.0; na++ 137; ca 8.7  07-28-18: tsh 0.299; free T3: 2.8; free T4: 0.92 08-17-18: INR 2.5  09-14-18: INR 2.7 10-05-18: INR 3.3 10-12-18: INR 1.9  10-19-18: INR 2.9 10-26-18: wbc 8.8; hgb 15.7; hct 49.2; mcv 95.7; plt 240; glucose 74; bun 25; creat 1.05; k+ 3.9; na++ 141; ca 8.9 INR 2.1  11-27-18: INR 2.7 tsh 0.051 free T3: 3.2; free T4: 1.16  TODAY;   12-11-18: INR 3.3 12-13-18: INR 2.7 12-20-18: INR 2.4 tsh 0.444 free T3: 2.4 free T4: 0.90  Review of Systems  Constitutional: Negative for malaise/fatigue.  Respiratory: Negative for cough and shortness of breath.   Cardiovascular: Negative for chest pain, palpitations and leg swelling.  Gastrointestinal: Negative for abdominal pain, constipation and heartburn.    Musculoskeletal: Negative for back pain, joint pain and myalgias.  Skin: Negative.   Neurological: Negative for dizziness.  Psychiatric/Behavioral: The patient is not nervous/anxious.      Physical Exam Constitutional:      General: He is not in acute distress.    Appearance: He is well-developed. He is obese. He is not diaphoretic.  Neck:     Musculoskeletal: Neck supple.     Thyroid: No thyromegaly.  Cardiovascular:     Rate and Rhythm: Normal rate and regular rhythm.     Pulses: Normal pulses.     Heart sounds: Normal heart sounds.  Pulmonary:     Effort: Pulmonary effort is normal. No respiratory distress.     Breath sounds: Normal breath sounds.  Abdominal:     General: Bowel sounds are normal. There is no distension.     Palpations: Abdomen is soft.     Tenderness: There is no abdominal tenderness.  Musculoskeletal:     Right lower leg: No edema.     Left lower leg: No edema.     Comments: Right hemiplegia Is able to move left extremities       Lymphadenopathy:     Cervical: No cervical adenopathy.  Skin:    General: Skin is warm and dry.     Comments: Bilateral lower extremities discolored  Neurological:     Mental Status: He is alert.  Psychiatric:        Mood and Affect: Mood normal.       ASSESSMENT/ PLAN:  TODAY   1. 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 Other chronic pulmonary embolism without acute cor pulmonale 3. Cerebrovascular accident (CVA) due to other mechanism 4. Long term current use of anticoagulation.   For INR 2.4 will continue coumadin 3 mg M and F 5 mg on other days Will check INR on 01-02-19      MD is aware of resident's narcotic use and is in agreement with current plan of care. We will attempt to wean resident as apropriate   Synthia Innocenteborah Amberlea Spagnuolo NP [facility]  Contact [phone] Monday through Friday 8am- 5pm  After hours call [phone]

## 2018-12-21 ENCOUNTER — Ambulatory Visit (INDEPENDENT_AMBULATORY_CARE_PROVIDER_SITE_OTHER): Payer: Medicare Other | Admitting: "Endocrinology

## 2018-12-21 ENCOUNTER — Encounter: Payer: Self-pay | Admitting: "Endocrinology

## 2018-12-21 DIAGNOSIS — E0590 Thyrotoxicosis, unspecified without thyrotoxic crisis or storm: Secondary | ICD-10-CM

## 2018-12-21 DIAGNOSIS — I10 Essential (primary) hypertension: Secondary | ICD-10-CM | POA: Diagnosis not present

## 2018-12-21 DIAGNOSIS — R279 Unspecified lack of coordination: Secondary | ICD-10-CM | POA: Diagnosis not present

## 2018-12-21 DIAGNOSIS — I6389 Other cerebral infarction: Secondary | ICD-10-CM

## 2018-12-21 DIAGNOSIS — G8191 Hemiplegia, unspecified affecting right dominant side: Secondary | ICD-10-CM | POA: Diagnosis not present

## 2018-12-21 DIAGNOSIS — M6281 Muscle weakness (generalized): Secondary | ICD-10-CM | POA: Diagnosis not present

## 2018-12-21 LAB — T3: T3, Total: 107 ng/dL (ref 71–180)

## 2018-12-21 LAB — T3, FREE: T3, Free: 2.4 pg/mL (ref 2.0–4.4)

## 2018-12-21 LAB — T4: T4, Total: 6.5 ug/dL (ref 4.5–12.0)

## 2018-12-21 NOTE — Progress Notes (Signed)
12/21/2018                                 Endocrinology Telehealth Visit Follow up Note -During COVID -19 Pandemic  I connected with [name] [name] on 12/21/2018   by telephone and verified that I am speaking with the correct person using two identifiers. [name] [name], [birth year]. he has verbally consented to this visit. All issues noted in this document were discussed and addressed. The format was not optimal for physical exam    Subjective:    Patient ID: [name] [name], male    DOB: [birth year], PCP Mahlon GammonGupta, Anjali L, MD   Past Medical History:  Diagnosis Date  . Dysphasia   . Fatty liver   . Hemiplegia (HCC)   . Hypertension   . Pneumonia   . Pulmonary embolism (HCC)   . 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(HCC)   . Stroke [facility](HCC)    Past Surgical History:  Procedure Laterality Date  . FLEXIBLE SIGMOIDOSCOPY N/A 10/21/2016   Procedure: FLEXIBLE SIGMOIDOSCOPY;  Surgeon: Malissa Hippoehman, Najeeb U, MD;  [facility];  Service: Endoscopy;  Laterality: N/A;  . FLEXIBLE SIGMOIDOSCOPY N/A 10/25/2016   Procedure: FLEXIBLE SIGMOIDOSCOPY;  Surgeon: Malissa Hippoehman, Najeeb U, MD;  [facility];  Service: Endoscopy;  Laterality: N/A;  . unable     Social History   Socioeconomic History  . Marital status: Single    Spouse name: Not on file  . Number of children: Not on file  . Years of education: Not on file  . Highest education level: Not on file  Occupational History  . Not on file  Social Needs  . Financial resource strain: Not hard at all  . Food insecurity    Worry: Never true    Inability: Never true  . Transportation needs    Medical: No    Non-medical: No  Tobacco Use  . Smoking status: Never Smoker  . Smokeless tobacco: Never Used  Substance and Sexual Activity  . Alcohol use: No    Alcohol/week: 0.0 standard drinks  . Drug use: No  . Sexual activity: Not on file  Lifestyle  . Physical activity    Days per week: 0 days    Minutes per session: 0 min  . Stress: Not at all   Relationships  . Social Musicianconnections    Talks on phone: Never    Gets together: Once a week    Attends religious service: Never    Active member of club or organization: No    Attends meetings of clubs or organizations: Never    Relationship status: Never married  Other Topics Concern  . Not on file  Social History Narrative  . Not on file   Outpatient Encounter Medications as of 12/21/2018  Medication Sig  . acetaminophen (TYLENOL) 325 MG tablet Take 650 mg by mouth every 6 (six) hours as needed for moderate pain.   Marland Kitchen. atorvastatin (LIPITOR) 10 MG tablet Take 10 mg by mouth daily.  . budesonide-formoterol (SYMBICORT) 80-4.5 MCG/ACT inhaler Inhale 2 puffs into the lungs 2 (two) times daily.  . carboxymethylcellulose (REFRESH PLUS) 0.5 % SOLN Place 1 drop into the right eye 3 (three) times daily.   Marland Kitchen. erythromycin ophthalmic ointment Place into the left eye. appy 1 ribbon ophthalmic (eye) 3 times a day  . hydrochlorothiazide (HYDRODIURIL) 12.5 MG tablet Take 12.5 mg by mouth daily.  Marland Kitchen. ipratropium-albuterol (DUONEB) 0.5-2.5 (3)  MG/3ML SOLN Take 3 mLs by nebulization every 6 (six) hours as needed.  . labetalol (NORMODYNE) 200 MG tablet Take 200 mg by mouth daily. For HTN  . loratadine (CLARITIN) 10 MG tablet Take 10 mg by mouth daily.  . meclizine (ANTIVERT) 12.5 MG tablet Take 12.5 mg by mouth every 8 (eight) hours as needed for dizziness.  . methimazole (TAPAZOLE) 5 MG tablet Take 5 mg by mouth daily.  . NON FORMULARY Diet Type:  Dysphagia 1 diet with honey thick liquids, NAS  . sennosides-docusate sodium (SENOKOT-S) 8.6-50 MG tablet Take 1 tablet by mouth at bedtime. For constipation  . umeclidinium bromide (INCRUSE ELLIPTA) 62.5 MCG/INH AEPB Inhale 1 puff into the lungs daily.  . valsartan (DIOVAN) 80 MG tablet Take 80 mg by mouth daily.  Marland Kitchen. warfarin (COUMADIN) 3 MG tablet Take 3 mg by mouth daily. On Mon,Fri  . warfarin (COUMADIN) 4 MG tablet Take 4 mg by mouth. Take on Sun, Tues, Wed,  Thurs,Sat   No facility-administered encounter medications on file as of 12/21/2018.    ALLERGIES: Allergies  Allergen Reactions  . Penicillins     Has patient had a PCN reaction causing immediate rash, facial/tongue/throat swelling, SOB or lightheadedness with hypotension: unknown Has patient had a PCN reaction causing severe rash involving mucus membranes or skin necrosis: unknown Has patient had a PCN reaction that required hospitalization: unknown Has patient had a PCN reaction occurring within the last 10 years: unknown If all of the above answers are "NO", then may proceed with Cephalosporin use. 5/3 Tolerated Cefepime x 1 dose in ED.     VACCINATION STATUS: Immunization History  Administered Date(s) Administered  . Influenza-Unspecified 03/28/2014, 03/23/2016, 03/25/2017, 03/23/2018  . Pneumococcal Conjugate-13 04/21/2015  . Pneumococcal-Unspecified 11/01/2009, 03/30/2016  . Tdap 03/17/2017    HPI [name] [name] is 73 y.o. male who is being engaged in telehealth for follow-up of hyperthyroidism.  He is being assisted by his nurse in group home.    -Since his last visit, he was put back on methimazole 5 mg p.o. daily due to relapse of mild hyperthyroidism.   His most recent thyroid function tests are suggestive of treatment response.  He has no new complaints today.  -  He has multiple medical problems including stroke with right-sided hemiplegia, nursing home resident. - He also has dysphagia, was found to be losing weight, currently reversing. - He was sent for thyroid uptake and scan which showed nonfocal elevated uptake of 42%.   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   Review of Systems musculoskeletal: + Wheelchair-bound ,  stiff muscles on extremities.  Skin: no rashes Limited as above.  Objective:    There were no vitals taken for this visit.  Wt  Readings from Last 3 Encounters:  12/20/18 220 lb (99.8 kg)  12/08/18 220 lb (99.8 kg)  11/27/18 220 lb (99.8 kg)       Diabetic Labs (most recent): Lab Results  Component Value Date   HGBA1C 6.0 04/26/2009   HGBA1C  03/27/2009    6.0 (NOTE) The ADA recommends the following therapeutic goal for glycemic control related to Hgb A1c measurement: Goal of therapy: <6.5 Hgb A1c  Reference: American Diabetes Association: Clinical Practice Recommendations 2010, Diabetes Care, 2010, 33: (Suppl  1).   HGBA1C 6 06/22/2008     Lipid Panel ( most recent) Lipid Panel     Component  Value Date/Time   CHOL 107 05/04/2018 0700   TRIG 50 05/04/2018 0700   HDL 43 05/04/2018 0700   CHOLHDL 2.5 05/04/2018 0700   VLDL 10 05/04/2018 0700   LDLCALC 54 05/04/2018 0700   Recent Results (from the past 2160 hour(s))  Protime-INR     Status: Abnormal   Collection Time: 10/05/18  4:38 AM  Result Value Ref Range   Prothrombin Time 33.1 (H) 11.4 - 15.2 seconds   INR 3.3 (H) 0.8 - 1.2    Comment: (NOTE) INR goal varies based on device and disease states. Performed at [facility], [address] [place], [place] [address]   Protime-INR     Status: Abnormal   Collection Time: 10/12/18  5:00 AM  Result Value Ref Range   Prothrombin Time 21.9 (H) 11.4 - 15.2 seconds   INR 1.9 (H) 0.8 - 1.2    Comment: (NOTE) INR goal varies based on device and disease states. Performed at [facility], [address], [place], [place] [address]   Protime-INR     Status: Abnormal   Collection Time: 10/19/18  7:52 AM  Result Value Ref Range   Prothrombin Time 30.0 (H) 11.4 - 15.2 seconds   INR 2.9 (H) 0.8 - 1.2    Comment: (NOTE) INR goal varies based on device and disease states. Performed at [facility], [address], [place], [place] [address]   CBC with Differential/Platelet     Status: None   Collection Time: 10/26/18  7:48 AM  Result Value Ref Range   WBC 8.8 4.0 - 10.5 K/uL   RBC 5.14 4.22 - 5.81  MIL/uL   Hemoglobin 15.7 13.0 - 17.0 g/dL   HCT 82.9 56.2 - 13.0 %   MCV 95.7 80.0 - 100.0 fL   MCH 30.5 26.0 - 34.0 pg   MCHC 31.9 30.0 - 36.0 g/dL   RDW 86.5 78.4 - 69.6 %   Platelets 240 150 - 400 K/uL   nRBC 0.0 0.0 - 0.2 %   Neutrophils Relative % 62 %   Neutro Abs 5.4 1.7 - 7.7 K/uL   Lymphocytes Relative 26 %   Lymphs Abs 2.3 0.7 - 4.0 K/uL   Monocytes Relative 8 %   Monocytes Absolute 0.7 0.1 - 1.0 K/uL   Eosinophils Relative 4 %   Eosinophils Absolute 0.4 0.0 - 0.5 K/uL   Basophils Relative 0 %   Basophils Absolute 0.0 0.0 - 0.1 K/uL   Immature Granulocytes 0 %   Abs Immature Granulocytes 0.02 0.00 - 0.07 K/uL    Comment: Performed at [facility], [address] [place], [place] [address]  Protime-INR     Status: Abnormal   Collection Time: 10/26/18  7:48 AM  Result Value Ref Range   Prothrombin Time 22.8 (H) 11.4 - 15.2 seconds   INR 2.1 (H) 0.8 - 1.2    Comment: (NOTE) INR goal varies based on device and disease states. Performed at [facility], [address], [place], [place] [address]   Basic metabolic panel     Status: Abnormal   Collection Time: 10/26/18  7:48 AM  Result Value Ref Range   Sodium 141 135 - 145 mmol/L   Potassium 3.9 3.5 - 5.1 mmol/L   Chloride 103 98 - 111 mmol/L   CO2 28 22 - 32 mmol/L   Glucose, Bld 74 70 - 99 mg/dL   BUN 25 (H) 8 - 23 mg/dL   Creatinine, Ser 4.01 0.61 - 1.24 mg/dL  Calcium 8.9 8.9 - 10.3 mg/dL   GFR calc non Af Amer >60 >60 mL/min   GFR calc Af Amer >60 >60 mL/min   Anion gap 10 5 - 15    Comment: Performed at [facility], [address] [place], [place] [address]  Protime-INR     Status: Abnormal   Collection Time: 11/27/18  2:51 AM  Result Value Ref Range   Prothrombin Time 28.2 (H) 11.4 - 15.2 seconds   INR 2.7 (H) 0.8 - 1.2    Comment: (NOTE) INR goal varies based on device and disease states. Performed at [facility], [name], [place], [place] [address]   T4, free     Status: None   Collection  Time: 11/27/18  2:51 AM  Result Value Ref Range   Free T4 1.16 0.82 - 1.77 ng/dL    Comment: (NOTE) Biotin ingestion may interfere with free T4 tests. If the results are inconsistent with the TSH level, previous test results, or the clinical presentation, then consider biotin interference. If needed, order repeat testing after stopping biotin. Performed at [facility], [place] [address] [place], [place] [address]   TSH     Status: Abnormal   Collection Time: 11/27/18  2:51 AM  Result Value Ref Range   TSH 0.051 (L) 0.350 - 4.500 uIU/mL    Comment: Performed by a 3rd Generation assay with a functional sensitivity of <=0.01 uIU/mL. Performed at [facility], [address], [place], [place] [address]   T3, free     Status: None   Collection Time: 11/27/18  2:51 AM  Result Value Ref Range   T3, Free 3.2 2.0 - 4.4 pg/mL    Comment: (NOTE) Performed At: [facility] [place], [place] [id number] Rush Farmer MD ZD:[id number]   Protime-INR     Status: Abnormal   Collection Time: 12/11/18  4:41 AM  Result Value Ref Range   Prothrombin Time 32.7 (H) 11.4 - 15.2 seconds   INR 3.3 (H) 0.8 - 1.2    Comment: (NOTE) INR goal varies based on device and disease states. Performed at [facility], [address], [place], [place] [address]   Protime-INR     Status: Abnormal   Collection Time: 12/13/18  4:15 AM  Result Value Ref Range   Prothrombin Time 28.3 (H) 11.4 - 15.2 seconds   INR 2.7 (H) 0.8 - 1.2    Comment: (NOTE) INR goal varies based on device and disease states. Performed at [facility], [address] [place], [place] [address]   Protime-INR     Status: Abnormal   Collection Time: 12/20/18  3:50 AM  Result Value Ref Range   Prothrombin Time 25.5 (H) 11.4 - 15.2 seconds   INR 2.4 (H) 0.8 - 1.2    Comment: (NOTE) INR goal varies based on device and disease states. Performed at [facility], [address], [place], [place] [address]   T4      Status: None   Collection Time: 12/20/18  3:50 AM  Result Value Ref Range   T4, Total 6.5 4.5 - 12.0 ug/dL    Comment: (NOTE) Performed At: [facility] [place], [place] [id number] Rush Farmer MD DD:[id number]   T4, free     Status: None   Collection Time: 12/20/18  3:50 AM  Result Value Ref Range   Free T4 0.90 0.61 - 1.12 ng/dL    Comment: (NOTE) Biotin ingestion may interfere with free  T4 tests. If the results are inconsistent with the TSH level, previous test results, or the clinical presentation, then consider biotin interference. If needed, order repeat testing after stopping biotin. Performed at [facility], [address] [place], [place] [address]   TSH     Status: None   Collection Time: 12/20/18  3:50 AM  Result Value Ref Range   TSH 0.444 0.350 - 4.500 uIU/mL    Comment: Performed by a 3rd Generation assay with a functional sensitivity of <=0.01 uIU/mL. Performed at [facility], [address] [place], [place] [address]   T3     Status: None   Collection Time: 12/20/18  3:50 AM  Result Value Ref Range   T3, Total 107 71 - 180 ng/dL    Comment: (NOTE) Performed At: [facility] [place] [address] [place], [place] [id number] Jolene SchimkeNagendra Sanjai MD QM:[phone]   T3, free     Status: None   Collection Time: 12/20/18  3:50 AM  Result Value Ref Range   T3, Free 2.4 2.0 - 4.4 pg/mL    Comment: (NOTE) Performed At: [facility] [address] [place], [place] [id number] Jolene SchimkeNagendra Sanjai MD NU:[id number]        Assessment & Plan:   1.  Hyperthyroidism -Due to relapse of primary hyperthyroidism, he was reinitiated on methimazole 5 mg p.o. daily in the interim.  He will be continued on same.  He will have repeat thyroid function test and office visit in 3 months.   -He is on labetalol 200 mg per daily related to his high blood pressure.  - I advised patient to maintain close follow up with Mahlon GammonGupta, Anjali L, MD for   primary care needs.   Time for this visit: 15 minutes. [name] [name] and his nurse participated in the discussions, expressed understanding, and voiced agreement with the above plans.  All questions were answered to his satisfaction. he is encouraged to contact clinic should he have any questions or concerns prior to his return visit.   Follow up plan: Return in about 3 months (around 03/23/2019) for Follow up with Pre-visit Labs.  Marquis LunchGebre Nida, MD [facility] Phone: [phone]  Fax: [phone]   12/21/2018, 1:03 PM This note was partially dictated with voice recognition software. Similar sounding words can be transcribed inadequately or may not  be corrected upon review.

## 2018-12-22 DIAGNOSIS — I10 Essential (primary) hypertension: Secondary | ICD-10-CM | POA: Diagnosis not present

## 2018-12-22 DIAGNOSIS — R279 Unspecified lack of coordination: Secondary | ICD-10-CM | POA: Diagnosis not present

## 2018-12-22 DIAGNOSIS — G8191 Hemiplegia, unspecified affecting right dominant side: Secondary | ICD-10-CM | POA: Diagnosis not present

## 2018-12-22 DIAGNOSIS — M6281 Muscle weakness (generalized): Secondary | ICD-10-CM | POA: Diagnosis not present

## 2018-12-25 DIAGNOSIS — R279 Unspecified lack of coordination: Secondary | ICD-10-CM | POA: Diagnosis not present

## 2018-12-25 DIAGNOSIS — G8191 Hemiplegia, unspecified affecting right dominant side: Secondary | ICD-10-CM | POA: Diagnosis not present

## 2018-12-25 DIAGNOSIS — I10 Essential (primary) hypertension: Secondary | ICD-10-CM | POA: Diagnosis not present

## 2018-12-25 DIAGNOSIS — M6281 Muscle weakness (generalized): Secondary | ICD-10-CM | POA: Diagnosis not present

## 2018-12-26 DIAGNOSIS — G8191 Hemiplegia, unspecified affecting right dominant side: Secondary | ICD-10-CM | POA: Diagnosis not present

## 2018-12-26 DIAGNOSIS — R279 Unspecified lack of coordination: Secondary | ICD-10-CM | POA: Diagnosis not present

## 2018-12-26 DIAGNOSIS — M6281 Muscle weakness (generalized): Secondary | ICD-10-CM | POA: Diagnosis not present

## 2018-12-26 DIAGNOSIS — I10 Essential (primary) hypertension: Secondary | ICD-10-CM | POA: Diagnosis not present

## 2019-01-02 ENCOUNTER — Encounter (HOSPITAL_COMMUNITY)
Admission: RE | Admit: 2019-01-02 | Discharge: 2019-01-02 | Disposition: A | Discharge status: Home / Self Care | Payer: Medicare Other | Source: Skilled Nursing Facility | Attending: Adult Health | Admitting: Adult Health

## 2019-01-02 ENCOUNTER — Non-Acute Institutional Stay (SKILLED_NURSING_FACILITY): Payer: Medicare Other | Admitting: Adult Health

## 2019-01-02 ENCOUNTER — Encounter: Payer: Self-pay | Admitting: Adult Health

## 2019-01-02 DIAGNOSIS — Z7901 Long term (current) use of anticoagulants: Secondary | ICD-10-CM | POA: Diagnosis not present

## 2019-01-02 DIAGNOSIS — I6389 Other cerebral infarction: Secondary | ICD-10-CM | POA: Diagnosis not present

## 2019-01-02 DIAGNOSIS — I2782 Chronic pulmonary embolism: Secondary | ICD-10-CM | POA: Diagnosis not present

## 2019-01-02 DIAGNOSIS — I82502 Chronic embolism and thrombosis of unspecified deep veins of left lower extremity: Secondary | ICD-10-CM

## 2019-01-02 LAB — PROTIME-INR
INR: 2.9 — ABNORMAL HIGH (ref 0.8–1.2)
Prothrombin Time: 30 seconds — ABNORMAL HIGH (ref 11.4–15.2)

## 2019-01-02 NOTE — Progress Notes (Signed)
Location:   [facility] Room Number: [address] W Place of Service:  SNF (31)   CODE STATUS: Full Code  Allergies  Allergen Reactions  . Penicillins     Has patient had a PCN reaction causing immediate rash, facial/tongue/throat swelling, SOB or lightheadedness with hypotension: unknown Has patient had a PCN reaction causing severe rash involving mucus membranes or skin necrosis: unknown Has patient had a PCN reaction that required hospitalization: unknown Has patient had a PCN reaction occurring within the last 10 years: unknown If all of the above answers are "NO", then may proceed with Cephalosporin use. 5/3 Tolerated Cefepime x 1 dose in ED.     Chief Complaint  Patient presents with  . Acute Visit    Pt / INR    HPI:  He is on long term coumadin therapy for: PE; DVT; CVA with an INR goal of 2-3. There are no reports of missed doses. No complaints of chest pain or shortness of breath. He denies any increased fatigue. There are no reports of abnormal bleeding.    Past Medical History:  Diagnosis Date  . Dysphasia   . Fatty liver   . Hemiplegia (HCC)   . Hypertension   . Pneumonia   . Pulmonary embolism (HCC)   . 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(HCC)   . Stroke [facility](HCC)     Past Surgical History:  Procedure Laterality Date  . FLEXIBLE SIGMOIDOSCOPY N/A 10/21/2016   Procedure: FLEXIBLE SIGMOIDOSCOPY;  Surgeon: Malissa Hippoehman, Najeeb U, MD;  [facility];  Service: Endoscopy;  Laterality: N/A;  . FLEXIBLE SIGMOIDOSCOPY N/A 10/25/2016   Procedure: FLEXIBLE SIGMOIDOSCOPY;  Surgeon: Malissa Hippoehman, Najeeb U, MD;  [facility];  Service: Endoscopy;  Laterality: N/A;  . unable      Social History   Socioeconomic History  . Marital status: Single    Spouse name: Not on file  . Number of children: Not on file  . Years of education: Not on file  . Highest education level: Not on file  Occupational History  . Not on file  Social Needs  . Financial resource  strain: Not hard at all  . Food insecurity    Worry: Never true    Inability: Never true  . Transportation needs    Medical: No    Non-medical: No  Tobacco Use  . Smoking status: Never Smoker  . Smokeless tobacco: Never Used  Substance and Sexual Activity  . Alcohol use: No    Alcohol/week: 0.0 standard drinks  . Drug use: No  . Sexual activity: Not on file  Lifestyle  . Physical activity    Days per week: 0 days    Minutes per session: 0 min  . Stress: Not at all  Relationships  . Social Musicianconnections    Talks on phone: Never    Gets together: Once a week    Attends religious service: Never    Active member of club or organization: No    Attends meetings of clubs or organizations: Never    Relationship status: Never married  . Intimate partner violence    Fear of current or ex partner: No    Emotionally abused: No    Physically abused: No    Forced sexual activity: No  Other Topics Concern  . Not on file  Social History Narrative  . Not on file   History reviewed. No pertinent family history.    VITAL SIGNS BP 98/61   Pulse 87  Temp (!) 97.5 F (36.4 C)   Resp 16   Ht 5' 8.5" (1.74 m)   Wt 217 lb 6.4 oz (98.6 kg)   BMI 32.57 kg/m   Outpatient Encounter Medications as of 01/02/2019  Medication Sig  . acetaminophen (TYLENOL) 325 MG tablet Take 650 mg by mouth every 6 (six) hours as needed for moderate pain.   Marland Kitchen atorvastatin (LIPITOR) 10 MG tablet Take 10 mg by mouth daily.  . budesonide-formoterol (SYMBICORT) 80-4.5 MCG/ACT inhaler Inhale 2 puffs into the lungs 2 (two) times daily.  . carboxymethylcellulose (REFRESH PLUS) 0.5 % SOLN Place 1 drop into the right eye 3 (three) times daily.   Marland Kitchen erythromycin ophthalmic ointment Place into the left eye. appy 1 ribbon ophthalmic (eye) 3 times a day  . hydrochlorothiazide (HYDRODIURIL) 12.5 MG tablet Take 12.5 mg by mouth daily.  Marland Kitchen ipratropium-albuterol (DUONEB) 0.5-2.5 (3) MG/3ML SOLN Take 3 mLs by nebulization  every 6 (six) hours as needed.  . labetalol (NORMODYNE) 200 MG tablet Take 200 mg by mouth daily. For HTN  . loratadine (CLARITIN) 10 MG tablet Take 10 mg by mouth daily.  . meclizine (ANTIVERT) 12.5 MG tablet Take 12.5 mg by mouth every 8 (eight) hours as needed for dizziness.  . methimazole (TAPAZOLE) 5 MG tablet Take 5 mg by mouth daily.  . NON FORMULARY Diet Type:  Dysphagia 1 diet with honey thick liquids, NAS  . sennosides-docusate sodium (SENOKOT-S) 8.6-50 MG tablet Take 1 tablet by mouth at bedtime. For constipation  . umeclidinium bromide (INCRUSE ELLIPTA) 62.5 MCG/INH AEPB Inhale 1 puff into the lungs daily.  . valsartan (DIOVAN) 80 MG tablet Take 80 mg by mouth daily.  Marland Kitchen warfarin (COUMADIN) 3 MG tablet Take 3 mg by mouth daily. On Mon,Fri  . warfarin (COUMADIN) 4 MG tablet Take 4 mg by mouth. Take on Sun, Tues, Wed, Thurs,Sat   No facility-administered encounter medications on file as of 01/02/2019.      SIGNIFICANT DIAGNOSTIC EXAMS  LABS REVIEWED: PREVIOUS   04-24-18: tsh 3.326 free T 4: 0.84 05-04-18: glucose 85; bun 31; creat 1.08 k+ 4.0;  na++ 138 ca 8.6; liver normal albumin 3.4 chol 107; ldl 54; trig 50; hdl 43  06-02-18: glucose 88; bun 25; creat 1.17; k+ 4.0; na++ 137; ca 8.7  07-28-18: tsh 0.299; free T3: 2.8; free T4: 0.92 08-17-18: INR 2.5  09-14-18: INR 2.7 10-05-18: INR 3.3 10-12-18: INR 1.9  10-19-18: INR 2.9 10-26-18: wbc 8.8; hgb 15.7; hct 49.2; mcv 95.7; plt 240; glucose 74; bun 25; creat 1.05; k+ 3.9; na++ 141; ca 8.9 INR 2.1  11-27-18: INR 2.7 tsh 0.051 free T3: 3.2; free T4: 1.16 12-11-18: INR 3.3 12-13-18: INR 2.7 12-20-18: INR 2.4 tsh 0.444 free T3: 2.4 free T4: 0.90  TODAY:   01-02-19: INR 2.9   Review of Systems  Constitutional: Negative for malaise/fatigue.  Respiratory: Negative for cough and shortness of breath.   Cardiovascular: Negative for chest pain, palpitations and leg swelling.  Gastrointestinal: Negative for abdominal pain, constipation and  heartburn.  Musculoskeletal: Negative for back pain, joint pain and myalgias.  Skin: Negative.   Neurological: Negative for dizziness.  Psychiatric/Behavioral: The patient is not nervous/anxious.     Physical Exam Constitutional:      General: He is not in acute distress.    Appearance: He is well-developed. He is obese. He is not diaphoretic.  Neck:     Musculoskeletal: Neck supple.     Thyroid: No thyromegaly.  Cardiovascular:  Rate and Rhythm: Normal rate and regular rhythm.     Pulses: Normal pulses.     Heart sounds: Normal heart sounds.  Pulmonary:     Effort: Pulmonary effort is normal. No respiratory distress.     Breath sounds: Normal breath sounds.  Abdominal:     General: Bowel sounds are normal. There is no distension.     Palpations: Abdomen is soft.     Tenderness: There is no abdominal tenderness.  Musculoskeletal:     Right lower leg: No edema.     Left lower leg: No edema.     Comments: Right hemiplegia Is able to move left extremities        Lymphadenopathy:     Cervical: No cervical adenopathy.  Skin:    General: Skin is warm and dry.     Comments: Bilateral lower extremities discolored   Neurological:     Mental Status: He is alert. Mental status is at baseline.  Psychiatric:        Mood and Affect: Mood normal.       ASSESSMENT/ PLAN:  TODAY   1. 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 Other chronic pulmonary embolism without acute cor pulmonale 3. Cerebrovascular accident (CVA) due to other mechanism 4. Long term current use of anticoagulation  For INR 2.9 will continue alternating doses of 3 mg 2 days weekly and 4 mg 5 days weekly  Will check INR 01-09-19.     MD is aware of resident's narcotic use and is in agreement with current plan of care. We will attempt to wean resident as apropriate   Synthia Innocenteborah Spenser Cong NP [facility]  Contact [phone] Monday through Friday 8am- 5pm   After hours call [phone]

## 2019-01-03 ENCOUNTER — Non-Acute Institutional Stay (SKILLED_NURSING_FACILITY): Payer: Medicare Other | Admitting: Internal Medicine

## 2019-01-03 ENCOUNTER — Encounter: Payer: Self-pay | Admitting: Internal Medicine

## 2019-01-03 DIAGNOSIS — I679 Cerebrovascular disease, unspecified: Secondary | ICD-10-CM | POA: Diagnosis not present

## 2019-01-03 DIAGNOSIS — G8191 Hemiplegia, unspecified affecting right dominant side: Secondary | ICD-10-CM

## 2019-01-03 DIAGNOSIS — I2782 Chronic pulmonary embolism: Secondary | ICD-10-CM | POA: Diagnosis not present

## 2019-01-03 DIAGNOSIS — I82502 Chronic embolism and thrombosis of unspecified deep veins of left lower extremity: Secondary | ICD-10-CM

## 2019-01-03 NOTE — Progress Notes (Signed)
Location:  [place] Room Number: 112/W Place of Service:  SNF (31)  [name], MD  Patient Care Team: [name], MD as PCP - General (Internal Medicine) Center, [place] ([place]) Nyoka Cowden, Phylis Bougie, NP as Nurse Practitioner (Geriatric Medicine)  Extended Emergency Contact Information Primary Emergency Contact: Malachy Chamber Address: [address]          [place], [place] [address] Johnnette Litter of [place] Phone: [phone] Mobile Phone: [phone] Relation: Brother   Chief Complaint  Patient presents with  . Medical Management of Chronic Issues    Routine visit of medical management  . Quality Metric Gaps    Colonoscopy    HPI:  Pt is a 73 y.o. 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   Past Medical History:  Diagnosis Date  . Dysphasia   . Fatty liver   . Hemiplegia ([place])   . Hypertension   . Pneumonia   . Pulmonary embolism ([place])   . 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([place])   . Stroke [facility])    Past Surgical History:  Procedure Laterality Date  . FLEXIBLE SIGMOIDOSCOPY N/A 10/21/2016   Procedure: FLEXIBLE SIGMOIDOSCOPY;  Surgeon: Rogene Houston, MD;  [facility];  Service: Endoscopy;  Laterality: N/A;  . FLEXIBLE SIGMOIDOSCOPY N/A 10/25/2016   Procedure: FLEXIBLE SIGMOIDOSCOPY;  Surgeon: Rogene Houston, MD;  [facility];  Service: Endoscopy;  Laterality: N/A;  . unable      Allergies  Allergen Reactions  . Penicillins     Has patient had a PCN reaction causing immediate rash, facial/tongue/throat swelling, SOB or lightheadedness with hypotension: unknown Has patient had a PCN reaction causing severe rash involving mucus membranes or skin necrosis: unknown Has patient had a PCN reaction that required hospitalization: unknown Has patient had a PCN reaction occurring within the last 10 years: unknown If all of the  above answers are "NO", then may proceed with Cephalosporin use. 5/3 Tolerated Cefepime x 1 dose in ED.     Outpatient Encounter Medications as of 01/03/2019  Medication Sig  . acetaminophen (TYLENOL) 325 MG tablet Take 650 mg by mouth every 6 (six) hours as needed for moderate pain.   Marland Kitchen atorvastatin (LIPITOR) 10 MG tablet Take 10 mg by mouth daily.  . budesonide-formoterol (SYMBICORT) 80-4.5 MCG/ACT inhaler Inhale 2 puffs into the lungs 2 (two) times daily.  . carboxymethylcellulose (REFRESH PLUS) 0.5 % SOLN Place 1 drop into the right eye 3 (three) times daily.   Marland Kitchen erythromycin ophthalmic ointment Place into the left eye. appy 1 ribbon ophthalmic (eye) 3 times a day  . hydrochlorothiazide (HYDRODIURIL) 12.5 MG tablet Take 12.5 mg by mouth daily.  Marland Kitchen ipratropium-albuterol (DUONEB) 0.5-2.5 (3) MG/3ML SOLN Take 3 mLs by nebulization every 6 (six) hours as needed.  . labetalol (NORMODYNE) 200 MG tablet Take 200 mg by mouth daily. For HTN  . loratadine (CLARITIN) 10 MG tablet Take 10 mg by mouth daily.  . meclizine (ANTIVERT) 12.5 MG tablet Take 12.5 mg by mouth every 8 (eight) hours as needed for dizziness.  . methimazole (TAPAZOLE) 5 MG tablet Take 5 mg by mouth daily.  . NON FORMULARY Diet Type:  Dysphagia 1 diet with honey thick liquids, NAS  . sennosides-docusate sodium (SENOKOT-S) 8.6-50 MG tablet Take 1 tablet by mouth at bedtime. For constipation  . umeclidinium bromide (INCRUSE ELLIPTA) 62.5 MCG/INH AEPB Inhale 1 puff into  the lungs daily.  . valsartan (DIOVAN) 80 MG tablet Take 80 mg by mouth daily.  Marland Kitchen. warfarin (COUMADIN) 3 MG tablet Take 3 mg by mouth daily. On Mon,Fri  . warfarin (COUMADIN) 4 MG tablet Take 4 mg by mouth. Take on Sun, Tues, Wed, Thurs,Sat   No facility-administered encounter medications on file as of 01/03/2019.      Review of Systems  DATA OBTAINED: from patient, nurse GENERAL:  no fevers, fatigue, appetite changes SKIN: No itching, rash HEENT: No complaint  RESPIRATORY: No cough, wheezing, SOB CARDIAC: No chest pain, palpitations, lower extremity edema  GI: No abdominal pain, No N/V/D or constipation, No heartburn or reflux  GU: No dysuria, frequency or urgency, or incontinence  MUSCULOSKELETAL: No unrelieved bone/joint pain NEUROLOGIC: No headache, dizziness  PSYCHIATRIC: No overt anxiety or sadness   Immunization History  Administered Date(s) Administered  . Influenza-Unspecified 03/28/2014, 03/23/2016, 03/25/2017, 03/23/2018  . Pneumococcal Conjugate-13 04/21/2015  . Pneumococcal-Unspecified 11/01/2009, 03/30/2016  . Tdap 03/17/2017   Pertinent  Health Maintenance Due  Topic Date Due  . COLONOSCOPY  10/30/2025 (Originally 11/08/1995)  . INFLUENZA VACCINE  01/20/2019  . PNA vac Low Risk Adult  Completed   Fall Risk  03/27/2018 04/05/2017 03/17/2017  Falls in the past year? No No No  Risk for fall due to : - Impaired balance/gait;Impaired mobility;Medication side effect;Impaired vision;History of fall(s) -    Vitals:   01/03/19 1156  BP: 98/61  Pulse: 87  Resp: 16  Temp: (!) 97.5 F (36.4 C)  TempSrc: Oral  SpO2: 91%  Weight: 217 lb 6.4 oz (98.6 kg)  Height: 5' 8.5" (1.74 m)   Body mass index is 32.57 kg/m.  Physical Exam  GENERAL APPEARANCE: Alert, conversant, No acute distress  SKIN: No diaphoresis rash HEENT: Unremarkable RESPIRATORY: Breathing is even, unlabored. Lung sounds are clear   CARDIOVASCULAR: Heart RRR no murmurs, rubs or gallops. No peripheral edema  GASTROINTESTINAL: Abdomen is soft, non-tender, not distended w/ normal bowel sounds.  GENITOURINARY: Bladder non tender, not distended  MUSCULOSKELETAL: No abnormal joints or musculature NEUROLOGIC: Cranial nerves 2-12 grossly intact right hemiplegia PSYCHIATRIC: Mood and affect appropriate to situation, no behavioral issues  Labs reviewed: Recent Labs    05/26/18 0700 06/02/18 0700 10/26/18 0748  NA 140 137 141  K 4.0 4.0 3.9  CL 102 101 103   CO2 30 29 28   GLUCOSE 81 88 74  BUN 24* 25* 25*  CREATININE 1.22 1.17 1.05  CALCIUM 8.9 8.7* 8.9   Recent Labs    05/04/18 0700  AST 24  ALT 33  ALKPHOS 57  BILITOT 0.8  PROT 7.3  ALBUMIN 3.4*   Recent Labs    01/23/18 0315 04/20/18 0709 10/26/18 0748  WBC 10.3 6.7 8.8  NEUTROABS  --  3.8 5.4  HGB 14.4 16.0 15.7  HCT 44.4 49.7 49.2  MCV 96.5 96.9 95.7  PLT 281 246 240   Recent Labs    05/04/18 0700  CHOL 107  LDLCALC 54  TRIG 50   No results found for: [facility] Lab Results  Component Value Date   TSH 0.444 12/20/2018   Lab Results  Component Value Date   HGBA1C 6.0 04/26/2009   Lab Results  Component Value Date   CHOL 107 05/04/2018   HDL 43 05/04/2018   LDLCALC 54 05/04/2018   TRIG 50 05/04/2018   CHOLHDL 2.5 05/04/2018    Significant Diagnostic Results in last 30 days:  No results found.  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 Lyn HollingsheadAlexander MD

## 2019-01-06 ENCOUNTER — Encounter: Payer: Self-pay | Admitting: Internal Medicine

## 2019-01-06 NOTE — Assessment & Plan Note (Signed)
Chronic and stable; continue Coumadin 3 mg Monday and Friday and 4 mg every other day of the week; INRs weekly

## 2019-01-06 NOTE — Assessment & Plan Note (Signed)
Chronic and stable; patient is not on ASA but he is on Coumadin, INR checks weekly and he is on Lipitor 40 mg daily continue current medications

## 2019-01-06 NOTE — Assessment & Plan Note (Addendum)
Chronic and stable; continue Coumadin 3 mg on Monday and Friday and 4 mg every other day of the week; INRs weekly

## 2019-01-09 ENCOUNTER — Other Ambulatory Visit (HOSPITAL_COMMUNITY)
Admission: RE | Admit: 2019-01-09 | Discharge: 2019-01-09 | Disposition: A | Discharge status: Home / Self Care | Payer: Medicare Other | Source: Skilled Nursing Facility | Attending: Adult Health | Admitting: Adult Health

## 2019-01-09 ENCOUNTER — Non-Acute Institutional Stay (SKILLED_NURSING_FACILITY): Payer: Medicare Other | Admitting: Adult Health

## 2019-01-09 ENCOUNTER — Encounter: Payer: Self-pay | Admitting: Adult Health

## 2019-01-09 DIAGNOSIS — I82502 Chronic embolism and thrombosis of unspecified deep veins of left lower extremity: Secondary | ICD-10-CM

## 2019-01-09 DIAGNOSIS — Z7901 Long term (current) use of anticoagulants: Secondary | ICD-10-CM | POA: Diagnosis not present

## 2019-01-09 DIAGNOSIS — I6389 Other cerebral infarction: Secondary | ICD-10-CM | POA: Diagnosis not present

## 2019-01-09 DIAGNOSIS — I2782 Chronic pulmonary embolism: Secondary | ICD-10-CM | POA: Diagnosis not present

## 2019-01-09 LAB — PROTIME-INR
INR: 3.5 — ABNORMAL HIGH (ref 0.8–1.2)
Prothrombin Time: 34.9 seconds — ABNORMAL HIGH (ref 11.4–15.2)

## 2019-01-09 NOTE — Progress Notes (Signed)
Location:   [place]  Room Number: [place] of Service:  SNF (31)   CODE STATUS: Full Code  Allergies  Allergen Reactions  . Penicillins     Has patient had a PCN reaction causing immediate rash, facial/tongue/throat swelling, SOB or lightheadedness with hypotension: unknown Has patient had a PCN reaction causing severe rash involving mucus membranes or skin necrosis: unknown Has patient had a PCN reaction that required hospitalization: unknown Has patient had a PCN reaction occurring within the last 10 years: unknown If all of the above answers are "NO", then may proceed with Cephalosporin use. 5/3 Tolerated Cefepime x 1 dose in ED.     Chief Complaint  Patient presents with  . Acute Visit    INR    HPI:  He is on chronic coumadin therapy for PE, DVT, CVA: inr range is 2-3. He denies any uncontrolled pain; no changes in his appetite; no anxiety. He is tolerating coumadin therapy without difficulty; no reports of missed doses.    Past Medical History:  Diagnosis Date  . Dysphasia   . Fatty liver   . Hemiplegia ([place])   . Hypertension   . Pneumonia   . Pulmonary embolism ([place])   . 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([place])   . Stroke [facility])     Past Surgical History:  Procedure Laterality Date  . FLEXIBLE SIGMOIDOSCOPY N/A 10/21/2016   Procedure: FLEXIBLE SIGMOIDOSCOPY;  Surgeon: Rogene Houston, MD;  [facility];  Service: Endoscopy;  Laterality: N/A;  . FLEXIBLE SIGMOIDOSCOPY N/A 10/25/2016   Procedure: FLEXIBLE SIGMOIDOSCOPY;  Surgeon: Rogene Houston, MD;  [facility];  Service: Endoscopy;  Laterality: N/A;  . unable      Social History   Socioeconomic History  . Marital status: Single    Spouse name: Not on file  . Number of children: Not on file  . Years of education: Not on file  . Highest education level: Not on file  Occupational History  . Not on file  Social Needs  . Financial resource strain: Not hard at all   . Food insecurity    Worry: Never true    Inability: Never true  . Transportation needs    Medical: No    Non-medical: No  Tobacco Use  . Smoking status: Never Smoker  . Smokeless tobacco: Never Used  Substance and Sexual Activity  . Alcohol use: No    Alcohol/week: 0.0 standard drinks  . Drug use: No  . Sexual activity: Not on file  Lifestyle  . Physical activity    Days per week: 0 days    Minutes per session: 0 min  . Stress: Not at all  Relationships  . Social Herbalist on phone: Never    Gets together: Once a week    Attends religious service: Never    Active member of club or organization: No    Attends meetings of clubs or organizations: Never    Relationship status: Never married  . Intimate partner violence    Fear of current or ex partner: No    Emotionally abused: No    Physically abused: No    Forced sexual activity: No  Other Topics Concern  . Not on file  Social History Narrative  . Not on file   History reviewed. No pertinent family history.    VITAL SIGNS BP 136/77   Pulse 78   Temp 99.1 F (37.3 C)   Resp  16   Ht 5' 8.5" (1.74 m)   Wt 217 lb 6.4 oz (98.6 kg)   BMI 32.57 kg/m   Outpatient Encounter Medications as of 01/09/2019  Medication Sig  . acetaminophen (TYLENOL) 325 MG tablet Take 650 mg by mouth every 6 (six) hours as needed for moderate pain.   Marland Kitchen. atorvastatin (LIPITOR) 10 MG tablet Take 10 mg by mouth daily.  . budesonide-formoterol (SYMBICORT) 80-4.5 MCG/ACT inhaler Inhale 2 puffs into the lungs 2 (two) times daily.  . carboxymethylcellulose (REFRESH PLUS) 0.5 % SOLN Place 1 drop into the right eye 3 (three) times daily.   Marland Kitchen. erythromycin ophthalmic ointment Place into the left eye. appy 1 ribbon ophthalmic (eye) 3 times a day  . hydrochlorothiazide (HYDRODIURIL) 12.5 MG tablet Take 12.5 mg by mouth daily.  Marland Kitchen. ipratropium-albuterol (DUONEB) 0.5-2.5 (3) MG/3ML SOLN Take 3 mLs by nebulization every 6 (six) hours as needed.   . labetalol (NORMODYNE) 200 MG tablet Take 200 mg by mouth daily. For HTN  . loratadine (CLARITIN) 10 MG tablet Take 10 mg by mouth daily.  . meclizine (ANTIVERT) 12.5 MG tablet Take 12.5 mg by mouth every 8 (eight) hours as needed for dizziness.  . methimazole (TAPAZOLE) 5 MG tablet Take 5 mg by mouth daily.  . NON FORMULARY Diet Type:  Dysphagia 1 diet with honey thick liquids, NAS  . sennosides-docusate sodium (SENOKOT-S) 8.6-50 MG tablet Take 1 tablet by mouth at bedtime. For constipation  . umeclidinium bromide (INCRUSE ELLIPTA) 62.5 MCG/INH AEPB Inhale 1 puff into the lungs daily.  . valsartan (DIOVAN) 80 MG tablet Take 80 mg by mouth daily.  Marland Kitchen. warfarin (COUMADIN) 3 MG tablet Take 3 mg by mouth daily. On Mon,Fri  . warfarin (COUMADIN) 4 MG tablet Take 4 mg by mouth. Take on Sun, Tues, Wed, Thurs,Sat   No facility-administered encounter medications on file as of 01/09/2019.      SIGNIFICANT DIAGNOSTIC EXAMS  LABS REVIEWED: PREVIOUS   04-24-18: tsh 3.326 free T 4: 0.84 05-04-18: glucose 85; bun 31; creat 1.08 k+ 4.0;  na++ 138 ca 8.6; liver normal albumin 3.4 chol 107; ldl 54; trig 50; hdl 43  06-02-18: glucose 88; bun 25; creat 1.17; k+ 4.0; na++ 137; ca 8.7  07-28-18: tsh 0.299; free T3: 2.8; free T4: 0.92 08-17-18: INR 2.5  09-14-18: INR 2.7 10-05-18: INR 3.3 10-12-18: INR 1.9  10-19-18: INR 2.9 10-26-18: wbc 8.8; hgb 15.7; hct 49.2; mcv 95.7; plt 240; glucose 74; bun 25; creat 1.05; k+ 3.9; na++ 141; ca 8.9 INR 2.1  11-27-18: INR 2.7 tsh 0.051 free T3: 3.2; free T4: 1.16 12-11-18: INR 3.3 12-13-18: INR 2.7 12-20-18: INR 2.4 tsh 0.444 free T3: 2.4 free T4: 0.90 01-02-19: INR 2.9   TODAY;   01-09-19: INR 3.5   Review of Systems  Constitutional: Negative for malaise/fatigue.  Respiratory: Negative for cough and shortness of breath.   Cardiovascular: Negative for chest pain, palpitations and leg swelling.  Gastrointestinal: Negative for abdominal pain, constipation and heartburn.   Musculoskeletal: Negative for back pain, joint pain and myalgias.  Skin: Negative.   Neurological: Negative for dizziness.  Psychiatric/Behavioral: The patient is not nervous/anxious.     Physical Exam Constitutional:      General: He is not in acute distress.    Appearance: He is well-developed. He is obese. He is not diaphoretic.  Neck:     Musculoskeletal: Neck supple.     Thyroid: No thyromegaly.  Cardiovascular:     Rate and Rhythm:  Normal rate and regular rhythm.     Pulses: Normal pulses.     Heart sounds: Normal heart sounds.  Pulmonary:     Effort: Pulmonary effort is normal. No respiratory distress.     Breath sounds: Normal breath sounds.  Abdominal:     General: Bowel sounds are normal. There is no distension.     Palpations: Abdomen is soft.     Tenderness: There is no abdominal tenderness.  Musculoskeletal:     Right lower leg: No edema.     Left lower leg: No edema.     Comments: Right hemiplegia Is able to move left extremities         Lymphadenopathy:     Cervical: No cervical adenopathy.  Skin:    General: Skin is warm and dry.     Comments: Bilateral lower extremities discolored    Neurological:     Mental Status: He is alert. Mental status is at baseline.  Psychiatric:        Mood and Affect: Mood normal.       ASSESSMENT/ PLAN:  TODAY   1. 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 Other chronic pulmonary embolism without acute cor pulmonale 3. Cerebrovascular accident (CVA) due to other mechanism 4. Long term current use of anticoagulation:    For INR 3.5: will hold coumadin one time then begin coumadin 4.5 mg daily and will check INR on 01-16-19.    MD is aware of resident's narcotic use and is in agreement with current plan of care. We will attempt to wean resident as apropriate   Synthia Innocenteborah Kylar Leonhardt NP [facility]  Contact [phone] Monday through Friday 8am- 5pm  After hours call  [phone]

## 2019-01-16 ENCOUNTER — Encounter: Payer: Self-pay | Admitting: Adult Health

## 2019-01-16 ENCOUNTER — Non-Acute Institutional Stay (SKILLED_NURSING_FACILITY): Payer: Medicare Other | Admitting: Adult Health

## 2019-01-16 ENCOUNTER — Other Ambulatory Visit (HOSPITAL_COMMUNITY)
Admission: RE | Admit: 2019-01-16 | Discharge: 2019-01-16 | Disposition: A | Discharge status: Home / Self Care | Payer: Medicare Other | Source: Ambulatory Visit | Attending: Internal Medicine | Admitting: Internal Medicine

## 2019-01-16 ENCOUNTER — Ambulatory Visit (HOSPITAL_COMMUNITY): Payer: Medicare Other

## 2019-01-16 DIAGNOSIS — Z7901 Long term (current) use of anticoagulants: Secondary | ICD-10-CM | POA: Diagnosis not present

## 2019-01-16 DIAGNOSIS — I82502 Chronic embolism and thrombosis of unspecified deep veins of left lower extremity: Secondary | ICD-10-CM | POA: Diagnosis not present

## 2019-01-16 DIAGNOSIS — I6389 Other cerebral infarction: Secondary | ICD-10-CM | POA: Diagnosis not present

## 2019-01-16 DIAGNOSIS — R05 Cough: Secondary | ICD-10-CM | POA: Insufficient documentation

## 2019-01-16 DIAGNOSIS — I2782 Chronic pulmonary embolism: Secondary | ICD-10-CM

## 2019-01-16 LAB — PROTIME-INR
INR: 4.4 (ref 0.8–1.2)
Prothrombin Time: 41.2 seconds — ABNORMAL HIGH (ref 11.4–15.2)

## 2019-01-16 NOTE — Progress Notes (Signed)
[name] Room Number: 51[address] Place of Service:  SNF (31)   CODE STATUS: Full Code  Allergies  Allergen Reactions  . Penicillins     Has patient had a PCN reaction causing immediate rash, facial/tongue/throat swelling, SOB or lightheadedness with hypotension: unknown Has patient had a PCN reaction causing severe rash involving mucus membranes or skin necrosis: unknown Has patient had a PCN reaction that required hospitalization: unknown Has patient had a PCN reaction occurring within the last 10 years: unknown If all of the above answers are "NO", then may proceed with Cephalosporin use. 5/3 Tolerated Cefepime x 1 dose in ED.     Chief Complaint  Patient presents with  . Acute Visit    INR    HPI:  He is on long term coumadin therapy for dvt; PE and CVA. The INR goal is 2-3. His INR today is 4.4. there are no reports of missed doses; no reports of abnormal bleeding. He does complain of a cough. He denies any sputum. There are no reports of fevers. He does have a history of aspiration.   Past Medical History:  Diagnosis Date  . Dysphasia   . Fatty liver   . Hemiplegia (HCC)   . Hypertension   . Pneumonia   . Pulmonary embolism (HCC)   . 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(HCC)   . Stroke [facility](HCC)     Past Surgical History:  Procedure Laterality Date  . FLEXIBLE SIGMOIDOSCOPY N/A 10/21/2016   Procedure: FLEXIBLE SIGMOIDOSCOPY;  Surgeon: Malissa Hippoehman, Najeeb U, MD;  [facility];  Service: Endoscopy;  Laterality: N/A;  . FLEXIBLE SIGMOIDOSCOPY N/A 10/25/2016   Procedure: FLEXIBLE SIGMOIDOSCOPY;  Surgeon: Malissa Hippoehman, Najeeb U, MD;  Location[facility];  Service: Endoscopy;  Laterality: N/A;  . unable      Social History   Socioeconomic History  . Marital status: Single    Spouse name: Not on file  . Number of children: Not on file  . Years of education: Not on file  . Highest education level: Not on file  Occupational History  . Not on file   Social Needs  . Financial resource strain: Not hard at all  . Food insecurity    Worry: Never true    Inability: Never true  . Transportation needs    Medical: No    Non-medical: No  Tobacco Use  . Smoking status: Never Smoker  . Smokeless tobacco: Never Used  Substance and Sexual Activity  . Alcohol use: No    Alcohol/week: 0.0 standard drinks  . Drug use: No  . Sexual activity: Not on file  Lifestyle  . Physical activity    Days per week: 0 days    Minutes per session: 0 min  . Stress: Not at all  Relationships  . Social Musicianconnections    Talks on phone: Never    Gets together: Once a week    Attends religious service: Never    Active member of club or organization: No    Attends meetings of clubs or organizations: Never    Relationship status: Never married  . Intimate partner violence    Fear of current or ex partner: No    Emotionally abused: No    Physically abused: No    Forced sexual activity: No  Other Topics Concern  . Not on file  Social History Narrative  . Not on file   History reviewed. No pertinent family history.  VITAL SIGNS BP 128/89   Pulse 73   Temp 97.6 F (36.4 C)   Resp 20   Ht 5' 8.5" (1.74 m)   Wt 217 lb 6.4 oz (98.6 kg)   BMI 32.57 kg/m   Outpatient Encounter Medications as of 01/16/2019  Medication Sig  . acetaminophen (TYLENOL) 325 MG tablet Take 650 mg by mouth every 6 (six) hours as needed for moderate pain.   Marland Kitchen atorvastatin (LIPITOR) 10 MG tablet Take 10 mg by mouth daily.  . budesonide-formoterol (SYMBICORT) 80-4.5 MCG/ACT inhaler Inhale 2 puffs into the lungs 2 (two) times daily.  . carboxymethylcellulose (REFRESH PLUS) 0.5 % SOLN Place 1 drop into the right eye 3 (three) times daily.   Marland Kitchen erythromycin ophthalmic ointment Place into the left eye. appy 1 ribbon ophthalmic (eye) 3 times a day  . hydrochlorothiazide (HYDRODIURIL) 12.5 MG tablet Take 12.5 mg by mouth daily.  Marland Kitchen ipratropium-albuterol (DUONEB) 0.5-2.5 (3) MG/3ML  SOLN Take 3 mLs by nebulization every 6 (six) hours as needed.  . labetalol (NORMODYNE) 200 MG tablet Take 200 mg by mouth daily. For HTN  . loratadine (CLARITIN) 10 MG tablet Take 10 mg by mouth daily.  . meclizine (ANTIVERT) 12.5 MG tablet Take 12.5 mg by mouth every 8 (eight) hours as needed for dizziness.  . methimazole (TAPAZOLE) 5 MG tablet Take 5 mg by mouth daily.  . NON FORMULARY Diet Type:  Dysphagia 1 diet with honey thick liquids, NAS  . sennosides-docusate sodium (SENOKOT-S) 8.6-50 MG tablet Take 1 tablet by mouth at bedtime. For constipation  . umeclidinium bromide (INCRUSE ELLIPTA) 62.5 MCG/INH AEPB Inhale 1 puff into the lungs daily.  . valsartan (DIOVAN) 80 MG tablet Take 80 mg by mouth daily.  Marland Kitchen warfarin (COUMADIN) 3 MG tablet Take 3 mg by mouth daily. On Mon,Fri  . warfarin (COUMADIN) 4 MG tablet Take 4 mg by mouth. Take on Sun, Tues, Wed, Thurs,Sat   No facility-administered encounter medications on file as of 01/16/2019.      SIGNIFICANT DIAGNOSTIC EXAMS  LABS REVIEWED: PREVIOUS   04-24-18: tsh 3.326 free T 4: 0.84 05-04-18: glucose 85; bun 31; creat 1.08 k+ 4.0;  na++ 138 ca 8.6; liver normal albumin 3.4 chol 107; ldl 54; trig 50; hdl 43  06-02-18: glucose 88; bun 25; creat 1.17; k+ 4.0; na++ 137; ca 8.7  07-28-18: tsh 0.299; free T3: 2.8; free T4: 0.92 08-17-18: INR 2.5  09-14-18: INR 2.7 10-05-18: INR 3.3 10-12-18: INR 1.9  10-19-18: INR 2.9 10-26-18: wbc 8.8; hgb 15.7; hct 49.2; mcv 95.7; plt 240; glucose 74; bun 25; creat 1.05; k+ 3.9; na++ 141; ca 8.9 INR 2.1  11-27-18: INR 2.7 tsh 0.051 free T3: 3.2; free T4: 1.16 12-11-18: INR 3.3 12-13-18: INR 2.7 12-20-18: INR 2.4 tsh 0.444 free T3: 2.4 free T4: 0.90 01-02-19: INR 2.9  01-09-19: INR 3.5  TODAY:  01-16-19: INR 4.4   Review of Systems  Constitutional: Negative for malaise/fatigue.  Respiratory: Positive for cough. Negative for shortness of breath.   Cardiovascular: Negative for chest pain, palpitations and leg  swelling.  Gastrointestinal: Negative for abdominal pain, constipation and heartburn.  Musculoskeletal: Negative for back pain, joint pain and myalgias.  Skin: Negative.   Neurological: Negative for dizziness.  Psychiatric/Behavioral: The patient is not nervous/anxious.     Physical Exam Constitutional:      General: He is not in acute distress.    Appearance: He is well-developed. He is not diaphoretic.  Neck:     Thyroid:  No thyromegaly.  Cardiovascular:     Rate and Rhythm: Normal rate and regular rhythm.     Heart sounds: Normal heart sounds.  Pulmonary:     Effort: Pulmonary effort is normal. No respiratory distress.     Breath sounds: Wheezing and rhonchi present.  Abdominal:     General: Bowel sounds are normal. There is no distension.     Palpations: Abdomen is soft.     Tenderness: There is no abdominal tenderness.  Musculoskeletal:     Right lower leg: No edema.     Left lower leg: No edema.     Comments: Right hemiplegia Is able to move left extremities          Lymphadenopathy:     Cervical: No cervical adenopathy.  Skin:    General: Skin is warm and dry.     Comments: Bilateral lower extremities discolored   Neurological:     Mental Status: He is alert. Mental status is at baseline.  Psychiatric:        Mood and Affect: Mood normal.     ASSESSMENT/ PLAN:  TODAY   1. 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 Other chronic pulmonary embolism without acute cor pulmonale 3. Cerebrovascular accident (CVA) due to there mechanism 4. Long term current use of anticoagulation    For INR 4.4 will hold coumadin for 2 days and repeat For his cough will get a chest x-ray and will treat as indicated he does have a history of aspiration.     MD is aware of resident's narcotic use and is in agreement with current plan of care. We will attempt to wean resident as apropriate   Synthia Innocenteborah Eladio Dentremont NP [facility]  Contact  [phone] Monday through Friday 8am- 5pm  After hours call [phone]

## 2019-01-17 ENCOUNTER — Encounter: Payer: Self-pay | Admitting: Adult Health

## 2019-01-17 ENCOUNTER — Non-Acute Institutional Stay (SKILLED_NURSING_FACILITY): Payer: Medicare Other | Admitting: Adult Health

## 2019-01-17 DIAGNOSIS — J690 Pneumonitis due to inhalation of food and vomit: Secondary | ICD-10-CM

## 2019-01-17 NOTE — Progress Notes (Signed)
Location:   [facility] Room Number: [id number] W Place of Service:  SNF (31)   CODE STATUS: Full Code  Allergies  Allergen Reactions   Penicillins     Has patient had a PCN reaction causing immediate rash, facial/tongue/throat swelling, SOB or lightheadedness with hypotension: unknown Has patient had a PCN reaction causing severe rash involving mucus membranes or skin necrosis: unknown Has patient had a PCN reaction that required hospitalization: unknown Has patient had a PCN reaction occurring within the last 10 years: unknown If all of the above answers are "NO", then may proceed with Cephalosporin use. 5/3 Tolerated Cefepime x 1 dose in ED.     Chief Complaint  Patient presents with   Acute Visit    Follow up Chest Xray    HPI:  He has developed a cough over the past couple of days. He denies any sputum production. He does have some shortness of breath. There are no reports of fevers present. His chest x-ray does demonstrate a left base infiltrate. He does have a history of of aspiration.   Past Medical History:  Diagnosis Date   Dysphasia    Fatty liver    Hemiplegia (HCC)    Hypertension    Pneumonia    Pulmonary embolism (HCC)    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(HCC)    Stroke [facility](HCC)     Past Surgical History:  Procedure Laterality Date   FLEXIBLE SIGMOIDOSCOPY N/A 10/21/2016   Procedure: FLEXIBLE SIGMOIDOSCOPY;  Surgeon: Malissa Hippoehman, Najeeb U, MD;  Location: [facility];  Service: Endoscopy;  Laterality: N/A;   FLEXIBLE SIGMOIDOSCOPY N/A 10/25/2016   Procedure: FLEXIBLE SIGMOIDOSCOPY;  Surgeon: Malissa Hippoehman, Najeeb U, MD;  Location: [facility];  Service: Endoscopy;  Laterality: N/A;   unable      Social History   Socioeconomic History   Marital status: Single    Spouse name: Not on file   Number of children: Not on file   Years of education: Not on file   Highest education level: Not on file  Occupational History   Not on file  Social  Needs   Financial resource strain: Not hard at all   Food insecurity    Worry: Never true    Inability: Never true   Transportation needs    Medical: No    Non-medical: No  Tobacco Use   Smoking status: Never Smoker   Smokeless tobacco: Never Used  Substance and Sexual Activity   Alcohol use: No    Alcohol/week: 0.0 standard drinks   Drug use: No   Sexual activity: Not on file  Lifestyle   Physical activity    Days per week: 0 days    Minutes per session: 0 min   Stress: Not at all  Relationships   Social connections    Talks on phone: Never    Gets together: Once a week    Attends religious service: Never    Active member of club or organization: No    Attends meetings of clubs or organizations: Never    Relationship status: Never married   Intimate partner violence    Fear of current or ex partner: No    Emotionally abused: No    Physically abused: No    Forced sexual activity: No  Other Topics Concern   Not on file  Social History Narrative   Not on file   History reviewed. No pertinent family history.    VITAL SIGNS Ht 5' 8.5" (1.74 m)  Wt 217 lb 6.4 oz (98.6 kg)    BMI 32.57 kg/m   Outpatient Encounter Medications as of 01/17/2019  Medication Sig   acetaminophen (TYLENOL) 325 MG tablet Take 650 mg by mouth every 6 (six) hours as needed for moderate pain.    atorvastatin (LIPITOR) 10 MG tablet Take 10 mg by mouth daily.   budesonide-formoterol (SYMBICORT) 80-4.5 MCG/ACT inhaler Inhale 2 puffs into the lungs 2 (two) times daily.   carboxymethylcellulose (REFRESH PLUS) 0.5 % SOLN Place 1 drop into the right eye 3 (three) times daily.    erythromycin ophthalmic ointment Place into the left eye. appy 1 ribbon ophthalmic (eye) 3 times a day   hydrochlorothiazide (HYDRODIURIL) 12.5 MG tablet Take 12.5 mg by mouth daily.   ipratropium-albuterol (DUONEB) 0.5-2.5 (3) MG/3ML SOLN Take 3 mLs by nebulization every 6 (six) hours as needed.    labetalol (NORMODYNE) 200 MG tablet Take 200 mg by mouth daily. For HTN   loratadine (CLARITIN) 10 MG tablet Take 10 mg by mouth daily.   meclizine (ANTIVERT) 12.5 MG tablet Take 12.5 mg by mouth every 8 (eight) hours as needed for dizziness.   methimazole (TAPAZOLE) 5 MG tablet Take 5 mg by mouth daily.   NON FORMULARY Diet Type:  Dysphagia 1 diet with honey thick liquids, NAS   sennosides-docusate sodium (SENOKOT-S) 8.6-50 MG tablet Take 1 tablet by mouth at bedtime. For constipation   umeclidinium bromide (INCRUSE ELLIPTA) 62.5 MCG/INH AEPB Inhale 1 puff into the lungs daily.   valsartan (DIOVAN) 80 MG tablet Take 80 mg by mouth daily.   warfarin (COUMADIN) 3 MG tablet Take 3 mg by mouth daily. On Mon,Fri   warfarin (COUMADIN) 4 MG tablet Take 4 mg by mouth. Take on Sun, Tues, Wed, Thurs,Sat   No facility-administered encounter medications on file as of 01/17/2019.      SIGNIFICANT DIAGNOSTIC EXAMS  TODAY;   01-16-19: chest x-ray: Infiltrate versus atelectasis at LEFT base    LABS REVIEWED: PREVIOUS   04-24-18: tsh 3.326 free T 4: 0.84 05-04-18: glucose 85; bun 31; creat 1.08 k+ 4.0;  na++ 138 ca 8.6; liver normal albumin 3.4 chol 107; ldl 54; trig 50; hdl 43  06-02-18: glucose 88; bun 25; creat 1.17; k+ 4.0; na++ 137; ca 8.7  07-28-18: tsh 0.299; free T3: 2.8; free T4: 0.92 08-17-18: INR 2.5  09-14-18: INR 2.7 10-05-18: INR 3.3 10-12-18: INR 1.9  10-19-18: INR 2.9 10-26-18: wbc 8.8; hgb 15.7; hct 49.2; mcv 95.7; plt 240; glucose 74; bun 25; creat 1.05; k+ 3.9; na++ 141; ca 8.9 INR 2.1  11-27-18: INR 2.7 tsh 0.051 free T3: 3.2; free T4: 1.16 12-11-18: INR 3.3 12-13-18: INR 2.7 12-20-18: INR 2.4 tsh 0.444 free T3: 2.4 free T4: 0.90 01-02-19: INR 2.9  01-09-19: INR 3.5  TODAY:  01-16-19: INR 4.4   Review of Systems  Constitutional: Negative for malaise/fatigue.  Respiratory: Positive for cough and shortness of breath. Negative for sputum production.   Cardiovascular: Negative  for chest pain, palpitations and leg swelling.  Gastrointestinal: Negative for abdominal pain, constipation and heartburn.  Musculoskeletal: Negative for back pain, joint pain and myalgias.  Skin: Negative.   Neurological: Negative for dizziness.  Psychiatric/Behavioral: The patient is not nervous/anxious.      Physical Exam Constitutional:      General: He is not in acute distress.    Appearance: He is well-developed. He is obese. He is not diaphoretic.  Neck:     Musculoskeletal: Neck supple.  Thyroid: No thyromegaly.  Cardiovascular:     Rate and Rhythm: Normal rate and regular rhythm.     Pulses: Normal pulses.     Heart sounds: Normal heart sounds.  Pulmonary:     Effort: Pulmonary effort is normal. No respiratory distress.     Breath sounds: Wheezing and rhonchi present.  Abdominal:     General: Bowel sounds are normal. There is no distension.     Palpations: Abdomen is soft.     Tenderness: There is no abdominal tenderness.  Musculoskeletal:     Right lower leg: No edema.     Left lower leg: No edema.     Comments: Right hemiplegia Is able to move left extremities           Lymphadenopathy:     Cervical: No cervical adenopathy.  Skin:    General: Skin is warm and dry.     Comments: Bilateral lower extremities are discolored   Neurological:     Mental Status: He is alert. Mental status is at baseline.  Psychiatric:        Mood and Affect: Mood normal.     ASSESSMENT/ PLAN:  TODAY:   1. Aspiration pneumonia of left lower lobe unspecified aspiration type: is worse: will begin doxycycline 100 mg twice daily through 01-27-19 with probiotic and will monitor his status. He is due for INR in the AM.   MD is aware of resident's narcotic use and is in agreement with current plan of care. We will attempt to wean resident as apropriate   Synthia Innocenteborah Ramsey Midgett NP [facility]  Contact [phone] Monday through Friday 8am- 5pm  After hours call [phone]

## 2019-01-18 ENCOUNTER — Other Ambulatory Visit (HOSPITAL_COMMUNITY)
Admission: RE | Admit: 2019-01-18 | Discharge: 2019-01-18 | Disposition: A | Discharge status: Home / Self Care | Payer: Medicare Other | Source: Skilled Nursing Facility | Attending: Adult Health | Admitting: Adult Health

## 2019-01-18 ENCOUNTER — Encounter: Payer: Self-pay | Admitting: Adult Health

## 2019-01-18 ENCOUNTER — Non-Acute Institutional Stay (SKILLED_NURSING_FACILITY): Payer: Medicare Other | Admitting: Adult Health

## 2019-01-18 DIAGNOSIS — I2782 Chronic pulmonary embolism: Secondary | ICD-10-CM

## 2019-01-18 DIAGNOSIS — I82502 Chronic embolism and thrombosis of unspecified deep veins of left lower extremity: Secondary | ICD-10-CM

## 2019-01-18 DIAGNOSIS — Z7901 Long term (current) use of anticoagulants: Secondary | ICD-10-CM | POA: Insufficient documentation

## 2019-01-18 DIAGNOSIS — I6389 Other cerebral infarction: Secondary | ICD-10-CM

## 2019-01-18 LAB — PROTIME-INR
INR: 3.3 — ABNORMAL HIGH (ref 0.8–1.2)
Prothrombin Time: 33.4 seconds — ABNORMAL HIGH (ref 11.4–15.2)

## 2019-01-18 NOTE — Progress Notes (Signed)
Location:   [place] Room Number: [place] of Service:  SNF (31)   CODE STATUS: Full Code  Allergies  Allergen Reactions  . Penicillins     Has patient had a PCN reaction causing immediate rash, facial/tongue/throat swelling, SOB or lightheadedness with hypotension: unknown Has patient had a PCN reaction causing severe rash involving mucus membranes or skin necrosis: unknown Has patient had a PCN reaction that required hospitalization: unknown Has patient had a PCN reaction occurring within the last 10 years: unknown If all of the above answers are "NO", then may proceed with Cephalosporin use. 5/3 Tolerated Cefepime x 1 dose in ED.     Chief Complaint  Patient presents with  . Acute Visit    INR    HPI:  He is on long term coumadin therapy for dvt; pe and cva. The INR goal is 2-3. His INR today is 3.3 his coumadin had been on hold for 2 days for INR of 4.4. he is presently on doxycycline for pneumonia. There are no reports of fever; no changes in appetite; does have a cough.    Past Medical History:  Diagnosis Date  . Dysphasia   . Fatty liver   . Hemiplegia ([place])   . Hypertension   . Pneumonia   . Pulmonary embolism ([place])   . 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([place])   . Stroke [facility])     Past Surgical History:  Procedure Laterality Date  . FLEXIBLE SIGMOIDOSCOPY N/A 10/21/2016   Procedure: FLEXIBLE SIGMOIDOSCOPY;  Surgeon: Rogene Houston, MD;  Location[facility];  Service: Endoscopy;  Laterality: N/A;  . FLEXIBLE SIGMOIDOSCOPY N/A 10/25/2016   Procedure: FLEXIBLE SIGMOIDOSCOPY;  Surgeon: Rogene Houston, MD;  Location: [facility];  Service: Endoscopy;  Laterality: N/A;  . unable      Social History   Socioeconomic History  . Marital status: Single    Spouse name: Not on file  . Number of children: Not on file  . Years of education: Not on file  . Highest education level: Not on file  Occupational History  . Not on file  Social  Needs  . Financial resource strain: Not hard at all  . Food insecurity    Worry: Never true    Inability: Never true  . Transportation needs    Medical: No    Non-medical: No  Tobacco Use  . Smoking status: Never Smoker  . Smokeless tobacco: Never Used  Substance and Sexual Activity  . Alcohol use: No    Alcohol/week: 0.0 standard drinks  . Drug use: No  . Sexual activity: Not on file  Lifestyle  . Physical activity    Days per week: 0 days    Minutes per session: 0 min  . Stress: Not at all  Relationships  . Social Herbalist on phone: Never    Gets together: Once a week    Attends religious service: Never    Active member of club or organization: No    Attends meetings of clubs or organizations: Never    Relationship status: Never married  . Intimate partner violence    Fear of current or ex partner: No    Emotionally abused: No    Physically abused: No    Forced sexual activity: No  Other Topics Concern  . Not on file  Social History Narrative  . Not on file   History reviewed. No pertinent family history.  VITAL SIGNS BP 109/71   Pulse 83   Temp 98.4 F (36.9 C)   Resp 20   Ht 5' 8.5" (1.74 m)   Wt 217 lb 6.4 oz (98.6 kg)   BMI 32.57 kg/m   Outpatient Encounter Medications as of 01/18/2019  Medication Sig  . acetaminophen (TYLENOL) 325 MG tablet Take 650 mg by mouth every 6 (six) hours as needed for moderate pain.   Marland Kitchen. atorvastatin (LIPITOR) 10 MG tablet Take 10 mg by mouth daily.  . budesonide-formoterol (SYMBICORT) 80-4.5 MCG/ACT inhaler Inhale 2 puffs into the lungs 2 (two) times daily.  . carboxymethylcellulose (REFRESH PLUS) 0.5 % SOLN Place 1 drop into the right eye 3 (three) times daily.   Marland Kitchen. doxycycline (VIBRAMYCIN) 100 MG capsule Take 100 mg by mouth 2 (two) times daily.  Marland Kitchen. erythromycin ophthalmic ointment Place into the left eye. appy 1 ribbon ophthalmic (eye) 3 times a day  . hydrochlorothiazide (HYDRODIURIL) 12.5 MG tablet Take  12.5 mg by mouth daily.  Marland Kitchen. ipratropium-albuterol (DUONEB) 0.5-2.5 (3) MG/3ML SOLN Take 3 mLs by nebulization every 6 (six) hours as needed.  . labetalol (NORMODYNE) 200 MG tablet Take 200 mg by mouth daily. For HTN  . lactobacillus acidophilus (BACID) TABS tablet Take 1 tablet by mouth 2 (two) times daily.  Marland Kitchen. loratadine (CLARITIN) 10 MG tablet Take 10 mg by mouth daily.  . meclizine (ANTIVERT) 12.5 MG tablet Take 12.5 mg by mouth every 8 (eight) hours as needed for dizziness.  . methimazole (TAPAZOLE) 5 MG tablet Take 5 mg by mouth daily.  . NON FORMULARY Diet Type:  Dysphagia 1 diet with honey thick liquids, NAS  . sennosides-docusate sodium (SENOKOT-S) 8.6-50 MG tablet Take 1 tablet by mouth at bedtime. For constipation  . umeclidinium bromide (INCRUSE ELLIPTA) 62.5 MCG/INH AEPB Inhale 1 puff into the lungs daily.  . valsartan (DIOVAN) 80 MG tablet Take 80 mg by mouth daily.  Marland Kitchen. warfarin (COUMADIN) 3 MG tablet Take 3 mg by mouth daily. On Mon,Fri  . warfarin (COUMADIN) 4 MG tablet Take 4 mg by mouth. Take on Sun, Tues, Wed, Thurs,Sat   No facility-administered encounter medications on file as of 01/18/2019.      SIGNIFICANT DIAGNOSTIC EXAMS  TODAY;   01-16-19: chest x-ray: Infiltrate versus atelectasis at LEFT base    LABS REVIEWED: PREVIOUS   04-24-18: tsh 3.326 free T 4: 0.84 05-04-18: glucose 85; bun 31; creat 1.08 k+ 4.0;  na++ 138 ca 8.6; liver normal albumin 3.4 chol 107; ldl 54; trig 50; hdl 43  06-02-18: glucose 88; bun 25; creat 1.17; k+ 4.0; na++ 137; ca 8.7  07-28-18: tsh 0.299; free T3: 2.8; free T4: 0.92 08-17-18: INR 2.5  09-14-18: INR 2.7 10-05-18: INR 3.3 10-12-18: INR 1.9  10-19-18: INR 2.9 10-26-18: wbc 8.8; hgb 15.7; hct 49.2; mcv 95.7; plt 240; glucose 74; bun 25; creat 1.05; k+ 3.9; na++ 141; ca 8.9 INR 2.1  11-27-18: INR 2.7 tsh 0.051 free T3: 3.2; free T4: 1.16 12-11-18: INR 3.3 12-13-18: INR 2.7 12-20-18: INR 2.4 tsh 0.444 free T3: 2.4 free T4: 0.90 01-02-19: INR 2.9   01-09-19: INR 3.5 01-16-19: INR 4.4  TODAY:   01-18-19: INR 3.3    Review of Systems  Constitutional: Negative for malaise/fatigue.  Respiratory: Positive for cough and shortness of breath.        Is better   Cardiovascular: Negative for chest pain, palpitations and leg swelling.  Gastrointestinal: Negative for abdominal pain, constipation and heartburn.  Musculoskeletal: Negative  for back pain, joint pain and myalgias.  Skin: Negative.   Neurological: Negative for dizziness.  Psychiatric/Behavioral: The patient is not nervous/anxious.      Physical Exam Constitutional:      General: He is not in acute distress.    Appearance: He is well-developed. He is obese. He is not diaphoretic.  Neck:     Thyroid: No thyromegaly.  Cardiovascular:     Rate and Rhythm: Normal rate and regular rhythm.     Heart sounds: Normal heart sounds.  Pulmonary:     Effort: Pulmonary effort is normal. No respiratory distress.     Breath sounds: Wheezing and rhonchi present.     Comments: Slightly improved  Abdominal:     General: Bowel sounds are normal. There is no distension.     Palpations: Abdomen is soft.     Tenderness: There is no abdominal tenderness.  Musculoskeletal:     Right lower leg: No edema.     Left lower leg: No edema.     Comments: Right hemiplegia Is able to move left extremities  Lymphadenopathy:     Cervical: No cervical adenopathy.  Skin:    General: Skin is warm and dry.     Comments: Bilateral lower extremities are discolored   Neurological:     Mental Status: He is alert. Mental status is at baseline.  Psychiatric:        Mood and Affect: Mood normal.       ASSESSMENT/ PLAN:  TODAY:   1. Cerebrovascular accident (CVA) due to other mechanism  2. 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  Other chronic pulmonary embolism without acute cor pulmonale 4. Long term current use of anticoagulant therapy.    For INR 3.3  will hold continue to hold coumadin will start 3 mg daily on 01-20-19 will check INR on 01-23-19.      MD is aware of resident's narcotic use and is in agreement with current plan of care. We will attempt to wean resident as apropriate   Synthia Innocenteborah  NP [facility]  Contact [phone] Monday through Friday 8am- 5pm  After hours call [phone]

## 2019-01-23 ENCOUNTER — Non-Acute Institutional Stay (SKILLED_NURSING_FACILITY): Payer: Medicare Other | Admitting: Adult Health

## 2019-01-23 ENCOUNTER — Encounter (HOSPITAL_COMMUNITY)
Admission: RE | Admit: 2019-01-23 | Discharge: 2019-01-23 | Disposition: A | Discharge status: Home / Self Care | Payer: Medicare Other | Source: Skilled Nursing Facility | Attending: Adult Health | Admitting: Adult Health

## 2019-01-23 ENCOUNTER — Encounter: Payer: Self-pay | Admitting: Adult Health

## 2019-01-23 DIAGNOSIS — I2782 Chronic pulmonary embolism: Secondary | ICD-10-CM

## 2019-01-23 DIAGNOSIS — R279 Unspecified lack of coordination: Secondary | ICD-10-CM | POA: Diagnosis not present

## 2019-01-23 DIAGNOSIS — Z7901 Long term (current) use of anticoagulants: Secondary | ICD-10-CM | POA: Diagnosis not present

## 2019-01-23 DIAGNOSIS — K5900 Constipation, unspecified: Secondary | ICD-10-CM | POA: Insufficient documentation

## 2019-01-23 DIAGNOSIS — R0981 Nasal congestion: Secondary | ICD-10-CM | POA: Diagnosis not present

## 2019-01-23 DIAGNOSIS — Z20828 Contact with and (suspected) exposure to other viral communicable diseases: Secondary | ICD-10-CM | POA: Diagnosis not present

## 2019-01-23 DIAGNOSIS — I6389 Other cerebral infarction: Secondary | ICD-10-CM

## 2019-01-23 DIAGNOSIS — E058 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: Secondary | ICD-10-CM | POA: Insufficient documentation

## 2019-01-23 DIAGNOSIS — I10 Essential (primary) hypertension: Secondary | ICD-10-CM | POA: Diagnosis not present

## 2019-01-23 DIAGNOSIS — H179 Unspecified corneal scar and opacity: Secondary | ICD-10-CM | POA: Insufficient documentation

## 2019-01-23 DIAGNOSIS — I82502 Chronic embolism and thrombosis of unspecified deep veins of left lower extremity: Secondary | ICD-10-CM

## 2019-01-23 LAB — PROTIME-INR
INR: 1.4 — ABNORMAL HIGH (ref 0.8–1.2)
Prothrombin Time: 17.3 seconds — ABNORMAL HIGH (ref 11.4–15.2)

## 2019-01-23 NOTE — Progress Notes (Signed)
Location:   [facility] Room Number: 23112 W Place of Service:  SNF (31)   CODE STATUS: Full Code  Allergies  Allergen Reactions  . Penicillins     Has patient had a PCN reaction causing immediate rash, facial/tongue/throat swelling, SOB or lightheadedness with hypotension: unknown Has patient had a PCN reaction causing severe rash involving mucus membranes or skin necrosis: unknown Has patient had a PCN reaction that required hospitalization: unknown Has patient had a PCN reaction occurring within the last 10 years: unknown If all of the above answers are "NO", then may proceed with Cephalosporin use. 5/3 Tolerated Cefepime x 1 dose in ED.     Chief Complaint  Patient presents with  . Acute Visit    INR    HPI:  He is long term coumadin therapy for dvt cva pe. His INR goal is 2-3; his INR today is 1.4. he has completed his abt of pneumonia. There are no reports of changes in his diet; no reports of missed doses; no reports of abnormal bleeding or bruising. We will be ablt to change him over to eliquis which will provide him with better anticoagulation.     Past Medical History:  Diagnosis Date  . Dysphasia   . Fatty liver   . Hemiplegia (HCC)   . Hypertension   . Pneumonia   . Pulmonary embolism (HCC)   . 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(HCC)   . Stroke [facility](HCC)     Past Surgical History:  Procedure Laterality Date  . FLEXIBLE SIGMOIDOSCOPY N/A 10/21/2016   Procedure: FLEXIBLE SIGMOIDOSCOPY;  Surgeon: Malissa Hippoehman, Najeeb U, MD;  Location: [facility];  Service: Endoscopy;  Laterality: N/A;  . FLEXIBLE SIGMOIDOSCOPY N/A 10/25/2016   Procedure: FLEXIBLE SIGMOIDOSCOPY;  Surgeon: Malissa Hippoehman, Najeeb U, MD;  Location: [facility];  Service: Endoscopy;  Laterality: N/A;  . unable      Social History   Socioeconomic History  . Marital status: Single    Spouse name: Not on file  . Number of children: Not on file  . Years of education: Not on file  . Highest  education level: Not on file  Occupational History  . Not on file  Social Needs  . Financial resource strain: Not hard at all  . Food insecurity    Worry: Never true    Inability: Never true  . Transportation needs    Medical: No    Non-medical: No  Tobacco Use  . Smoking status: Never Smoker  . Smokeless tobacco: Never Used  Substance and Sexual Activity  . Alcohol use: No    Alcohol/week: 0.0 standard drinks  . Drug use: No  . Sexual activity: Not on file  Lifestyle  . Physical activity    Days per week: 0 days    Minutes per session: 0 min  . Stress: Not at all  Relationships  . Social Musicianconnections    Talks on phone: Never    Gets together: Once a week    Attends religious service: Never    Active member of club or organization: No    Attends meetings of clubs or organizations: Never    Relationship status: Never married  . Intimate partner violence    Fear of current or ex partner: No    Emotionally abused: No    Physically abused: No    Forced sexual activity: No  Other Topics Concern  . Not on file  Social History Narrative  . Not on file  History reviewed. No pertinent family history.    VITAL SIGNS BP 120/89   Pulse 73   Temp 98.6 F (37 C)   Resp 16   Ht 5' 8.5" (1.74 m)   Wt 226 lb 8 oz (102.7 kg)   BMI 33.94 kg/m   Outpatient Encounter Medications as of 01/23/2019  Medication Sig  . acetaminophen (TYLENOL) 325 MG tablet Take 650 mg by mouth every 6 (six) hours as needed for moderate pain.   Marland Kitchen atorvastatin (LIPITOR) 10 MG tablet Take 10 mg by mouth daily.  . budesonide-formoterol (SYMBICORT) 80-4.5 MCG/ACT inhaler Inhale 2 puffs into the lungs 2 (two) times daily.  . carboxymethylcellulose (REFRESH PLUS) 0.5 % SOLN Place 1 drop into the right eye 3 (three) times daily.   Marland Kitchen doxycycline (VIBRAMYCIN) 100 MG capsule Take 100 mg by mouth 2 (two) times daily.  Marland Kitchen erythromycin ophthalmic ointment Place into the left eye. appy 1 ribbon ophthalmic (eye) 3  times a day  . hydrochlorothiazide (HYDRODIURIL) 12.5 MG tablet Take 12.5 mg by mouth daily.  Marland Kitchen ipratropium-albuterol (DUONEB) 0.5-2.5 (3) MG/3ML SOLN Take 3 mLs by nebulization every 6 (six) hours as needed.  . labetalol (NORMODYNE) 200 MG tablet Take 200 mg by mouth daily. For HTN  . lactobacillus acidophilus (BACID) TABS tablet Take 1 tablet by mouth 2 (two) times daily.  Marland Kitchen loratadine (CLARITIN) 10 MG tablet Take 10 mg by mouth daily.  . meclizine (ANTIVERT) 12.5 MG tablet Take 12.5 mg by mouth every 8 (eight) hours as needed for dizziness.  . methimazole (TAPAZOLE) 5 MG tablet Take 5 mg by mouth daily.  . NON FORMULARY Diet Type:  Dysphagia 1 diet with honey thick liquids, NAS  . sennosides-docusate sodium (SENOKOT-S) 8.6-50 MG tablet Take 1 tablet by mouth at bedtime. For constipation  . umeclidinium bromide (INCRUSE ELLIPTA) 62.5 MCG/INH AEPB Inhale 1 puff into the lungs daily.  . valsartan (DIOVAN) 80 MG tablet Take 80 mg by mouth daily.  Marland Kitchen warfarin (COUMADIN) 3 MG tablet Take 3 mg by mouth daily. On Mon,Fri  . warfarin (COUMADIN) 4 MG tablet Take 4 mg by mouth. Take on Sun, Tues, Wed, Thurs,Sat   No facility-administered encounter medications on file as of 01/23/2019.      SIGNIFICANT DIAGNOSTIC EXAMS  TODAY;   01-16-19: chest x-ray: Infiltrate versus atelectasis at LEFT base    LABS REVIEWED: PREVIOUS   04-24-18: tsh 3.326 free T 4: 0.84 05-04-18: glucose 85; bun 31; creat 1.08 k+ 4.0;  na++ 138 ca 8.6; liver normal albumin 3.4 chol 107; ldl 54; trig 50; hdl 43  06-02-18: glucose 88; bun 25; creat 1.17; k+ 4.0; na++ 137; ca 8.7  07-28-18: tsh 0.299; free T3: 2.8; free T4: 0.92 08-17-18: INR 2.5  09-14-18: INR 2.7 10-05-18: INR 3.3 10-12-18: INR 1.9  10-19-18: INR 2.9 10-26-18: wbc 8.8; hgb 15.7; hct 49.2; mcv 95.7; plt 240; glucose 74; bun 25; creat 1.05; k+ 3.9; na++ 141; ca 8.9 INR 2.1  11-27-18: INR 2.7 tsh 0.051 free T3: 3.2; free T4: 1.16 12-11-18: INR 3.3 12-13-18: INR 2.7  12-20-18: INR 2.4 tsh 0.444 free T3: 2.4 free T4: 0.90 01-02-19: INR 2.9  01-09-19: INR 3.5 01-16-19: INR 4.4 01-18-19: INR 3.3   TODAY:   01-23-19: INR 1.4    Review of Systems  Constitutional: Negative for malaise/fatigue.  Respiratory: Negative for cough and shortness of breath.   Cardiovascular: Negative for chest pain, palpitations and leg swelling.  Gastrointestinal: Negative for abdominal pain, constipation and  heartburn.  Musculoskeletal: Negative for back pain, joint pain and myalgias.  Skin: Negative.   Neurological: Negative for dizziness.  Psychiatric/Behavioral: The patient is not nervous/anxious.    Physical Exam Constitutional:      General: He is not in acute distress.    Appearance: He is well-developed. He is obese. He is not diaphoretic.  Neck:     Musculoskeletal: Neck supple.     Thyroid: No thyromegaly.  Cardiovascular:     Rate and Rhythm: Normal rate and regular rhythm.     Pulses: Normal pulses.     Heart sounds: Normal heart sounds.  Pulmonary:     Effort: Pulmonary effort is normal. No respiratory distress.     Breath sounds: Normal breath sounds.  Abdominal:     General: Bowel sounds are normal. There is no distension.     Palpations: Abdomen is soft.     Tenderness: There is no abdominal tenderness.  Musculoskeletal:     Right lower leg: No edema.     Left lower leg: No edema.     Comments: Right hemiplegia Is able to move left extremities   Lymphadenopathy:     Cervical: No cervical adenopathy.  Skin:    General: Skin is warm and dry.     Comments: Bilateral lower extremities are discolored    Neurological:     Mental Status: He is alert. Mental status is at baseline.  Psychiatric:        Mood and Affect: Mood normal.      ASSESSMENT/ PLAN:  TODAY:   1. 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 Cerebrovascular accident (CVA) due to other mechanism 3. Other pulmonary embolism without acute cor  pulmonale 4. Long term current use of anticoagulation therapy  His INR is 1.4 will stop coumadin therapy at this time and will begin him on eliquis 5 mg twice daily and will continue to monitor his status.     MD is aware of resident's narcotic use and is in agreement with current plan of care. We will attempt to wean resident as apropriate   Synthia Innocenteborah Duchess Armendarez NP [facility]  Contact [phone] Monday through Friday 8am- 5pm  After hours call [phone]

## 2019-01-29 ENCOUNTER — Non-Acute Institutional Stay (SKILLED_NURSING_FACILITY): Payer: Medicare Other | Admitting: Adult Health

## 2019-01-29 ENCOUNTER — Encounter: Payer: Self-pay | Admitting: Adult Health

## 2019-01-29 DIAGNOSIS — J690 Pneumonitis due to inhalation of food and vomit: Secondary | ICD-10-CM

## 2019-01-29 NOTE — Progress Notes (Signed)
Location:   [facility] Room Number: 39112 W Place of Service:  SNF (31)   CODE STATUS: Full Code  Allergies  Allergen Reactions  . Penicillins     Has patient had a PCN reaction causing immediate rash, facial/tongue/throat swelling, SOB or lightheadedness with hypotension: unknown Has patient had a PCN reaction causing severe rash involving mucus membranes or skin necrosis: unknown Has patient had a PCN reaction that required hospitalization: unknown Has patient had a PCN reaction occurring within the last 10 years: unknown If all of the above answers are "NO", then may proceed with Cephalosporin use. 5/3 Tolerated Cefepime x 1 dose in ED.     Chief Complaint  Patient presents with  . Acute Visit    Possible Aspiration    HPI:  Staff report that he has had increased coughing and congestion present. He has a history of aspiration. He has just completed doxycycline for aspiration. He does complain of shortness of breath. He is being seen by speech therapy. There are no reports of fevers present.   Past Medical History:  Diagnosis Date  . Dysphasia   . Fatty liver   . Hemiplegia (HCC)   . Hypertension   . Pneumonia   . Pulmonary embolism (HCC)   . 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(HCC)   . Stroke [facility](HCC)     Past Surgical History:  Procedure Laterality Date  . FLEXIBLE SIGMOIDOSCOPY N/A 10/21/2016   Procedure: FLEXIBLE SIGMOIDOSCOPY;  Surgeon: Malissa Hippoehman, Najeeb U, MD;  Location: [facility];  Service: Endoscopy;  Laterality: N/A;  . FLEXIBLE SIGMOIDOSCOPY N/A 10/25/2016   Procedure: FLEXIBLE SIGMOIDOSCOPY;  Surgeon: Malissa Hippoehman, Najeeb U, MD;  Location: [facility];  Service: Endoscopy;  Laterality: N/A;  . unable      Social History   Socioeconomic History  . Marital status: Single    Spouse name: Not on file  . Number of children: Not on file  . Years of education: Not on file  . Highest education level: Not on file  Occupational History  . Not on file   Social Needs  . Financial resource strain: Not hard at all  . Food insecurity    Worry: Never true    Inability: Never true  . Transportation needs    Medical: No    Non-medical: No  Tobacco Use  . Smoking status: Never Smoker  . Smokeless tobacco: Never Used  Substance and Sexual Activity  . Alcohol use: No    Alcohol/week: 0.0 standard drinks  . Drug use: No  . Sexual activity: Not on file  Lifestyle  . Physical activity    Days per week: 0 days    Minutes per session: 0 min  . Stress: Not at all  Relationships  . Social Musicianconnections    Talks on phone: Never    Gets together: Once a week    Attends religious service: Never    Active member of club or organization: No    Attends meetings of clubs or organizations: Never    Relationship status: Never married  . Intimate partner violence    Fear of current or ex partner: No    Emotionally abused: No    Physically abused: No    Forced sexual activity: No  Other Topics Concern  . Not on file  Social History Narrative  . Not on file   History reviewed. No pertinent family history.    VITAL SIGNS Ht 5' 8.5" (1.74 m)   Wt 226  lb 8 oz (102.7 kg)   BMI 33.94 kg/m   Outpatient Encounter Medications as of 01/29/2019  Medication Sig  . acetaminophen (TYLENOL) 325 MG tablet Take 650 mg by mouth every 6 (six) hours as needed for moderate pain.   Marland Kitchen. apixaban (ELIQUIS) 5 MG TABS tablet Take 5 mg by mouth 2 (two) times daily.  Marland Kitchen. atorvastatin (LIPITOR) 10 MG tablet Take 10 mg by mouth daily.  . budesonide-formoterol (SYMBICORT) 80-4.5 MCG/ACT inhaler Inhale 2 puffs into the lungs 2 (two) times daily.  . carboxymethylcellulose (REFRESH PLUS) 0.5 % SOLN Place 1 drop into the right eye 3 (three) times daily.   Marland Kitchen. erythromycin ophthalmic ointment Place into the left eye. appy 1 ribbon ophthalmic (eye) 3 times a day  . hydrochlorothiazide (HYDRODIURIL) 12.5 MG tablet Take 12.5 mg by mouth daily.  Marland Kitchen. ipratropium-albuterol (DUONEB)  0.5-2.5 (3) MG/3ML SOLN Take 3 mLs by nebulization every 6 (six) hours as needed.  . labetalol (NORMODYNE) 200 MG tablet Take 200 mg by mouth daily. For HTN  . loratadine (CLARITIN) 10 MG tablet Take 10 mg by mouth daily.  . meclizine (ANTIVERT) 12.5 MG tablet Take 12.5 mg by mouth every 8 (eight) hours as needed for dizziness.  . methimazole (TAPAZOLE) 5 MG tablet Take 5 mg by mouth daily.  . NON FORMULARY Diet Type:  Dysphagia 1 diet with honey thick liquids, NAS  . OXYGEN Inhale 2 L/min into the lungs continuous.  . sennosides-docusate sodium (SENOKOT-S) 8.6-50 MG tablet Take 1 tablet by mouth at bedtime. For constipation  . umeclidinium bromide (INCRUSE ELLIPTA) 62.5 MCG/INH AEPB Inhale 1 puff into the lungs daily.  . valsartan (DIOVAN) 80 MG tablet Take 80 mg by mouth daily.  . [DISCONTINUED] warfarin (COUMADIN) 3 MG tablet Take 3 mg by mouth daily. On Mon,Fri  . [DISCONTINUED] warfarin (COUMADIN) 4 MG tablet Take 4 mg by mouth. Take on Sun, Tues, Wed, Thurs,Sat   No facility-administered encounter medications on file as of 01/29/2019.      SIGNIFICANT DIAGNOSTIC EXAMS  PREVIOUS;   01-16-19: chest x-ray: Infiltrate versus atelectasis at LEFT base   NO NEW EXAMS.    LABS REVIEWED: PREVIOUS   04-24-18: tsh 3.326 free T 4: 0.84 05-04-18: glucose 85; bun 31; creat 1.08 k+ 4.0;  na++ 138 ca 8.6; liver normal albumin 3.4 chol 107; ldl 54; trig 50; hdl 43  06-02-18: glucose 88; bun 25; creat 1.17; k+ 4.0; na++ 137; ca 8.7  07-28-18: tsh 0.299; free T3: 2.8; free T4: 0.92 08-17-18: INR 2.5  09-14-18: INR 2.7 10-05-18: INR 3.3 10-12-18: INR 1.9  10-19-18: INR 2.9 10-26-18: wbc 8.8; hgb 15.7; hct 49.2; mcv 95.7; plt 240; glucose 74; bun 25; creat 1.05; k+ 3.9; na++ 141; ca 8.9 INR 2.1  11-27-18: INR 2.7 tsh 0.051 free T3: 3.2; free T4: 1.16 12-11-18: INR 3.3 12-13-18: INR 2.7 12-20-18: INR 2.4 tsh 0.444 free T3: 2.4 free T4: 0.90 01-02-19: INR 2.9  01-09-19: INR 3.5 01-16-19: INR 4.4 01-18-19: INR  3.3  01-23-19: INR 1.4   NO NEW LABS.    Review of Systems  Constitutional: Negative for malaise/fatigue.  Respiratory: Positive for cough and shortness of breath.        Increased congestion   Cardiovascular: Negative for chest pain, palpitations and leg swelling.  Gastrointestinal: Negative for abdominal pain, constipation and heartburn.  Musculoskeletal: Negative for back pain, joint pain and myalgias.  Skin: Negative.   Neurological: Negative for dizziness.  Psychiatric/Behavioral: The patient is not nervous/anxious.  Physical Exam Constitutional:      General: He is not in acute distress.    Appearance: He is well-developed. He is obese. He is not diaphoretic.  Neck:     Musculoskeletal: Neck supple.     Thyroid: No thyromegaly.  Cardiovascular:     Rate and Rhythm: Normal rate and regular rhythm.     Pulses: Normal pulses.     Heart sounds: Normal heart sounds.  Pulmonary:     Effort: Pulmonary effort is normal. No respiratory distress.     Breath sounds: Rhonchi and rales present.     Comments: On right lower lobes present  Abdominal:     General: Bowel sounds are normal. There is no distension.     Palpations: Abdomen is soft.     Tenderness: There is no abdominal tenderness.  Musculoskeletal:     Right lower leg: No edema.     Left lower leg: No edema.     Comments: Right hemiplegia Is able to move left extremities    Lymphadenopathy:     Cervical: No cervical adenopathy.  Skin:    General: Skin is warm and dry.     Comments: Bilateral lower extremities are discolored  Neurological:     Mental Status: He is alert. Mental status is at baseline.  Psychiatric:        Mood and Affect: Mood normal.       ASSESSMENT/ PLAN:  TODAY:   1. Aspiration pneumonia right lower lobe; unspecified aspiration pneumonia type: is worse will begin azithromax 500 mg daily through 02-05-19   MD is aware of resident's narcotic use and is in agreement with current plan of  care. We will attempt to wean resident as apropriate   Ok Edwards NP [facility]  Contact [phone] Monday through Friday 8am- 5pm  After hours call [phone]

## 2019-01-30 DIAGNOSIS — I10 Essential (primary) hypertension: Secondary | ICD-10-CM | POA: Diagnosis not present

## 2019-01-30 DIAGNOSIS — R1312 Dysphagia, oropharyngeal phase: Secondary | ICD-10-CM | POA: Diagnosis not present

## 2019-01-30 DIAGNOSIS — R279 Unspecified lack of coordination: Secondary | ICD-10-CM | POA: Diagnosis not present

## 2019-01-30 DIAGNOSIS — R0981 Nasal congestion: Secondary | ICD-10-CM | POA: Diagnosis not present

## 2019-01-30 DIAGNOSIS — J690 Pneumonitis due to inhalation of food and vomit: Secondary | ICD-10-CM | POA: Insufficient documentation

## 2019-01-30 DIAGNOSIS — M6281 Muscle weakness (generalized): Secondary | ICD-10-CM | POA: Diagnosis not present

## 2019-02-02 ENCOUNTER — Encounter: Payer: Self-pay | Admitting: Adult Health

## 2019-02-02 ENCOUNTER — Non-Acute Institutional Stay (SKILLED_NURSING_FACILITY): Payer: Medicare Other | Admitting: Adult Health

## 2019-02-02 DIAGNOSIS — G8191 Hemiplegia, unspecified affecting right dominant side: Secondary | ICD-10-CM | POA: Diagnosis not present

## 2019-02-02 DIAGNOSIS — E785 Hyperlipidemia, unspecified: Secondary | ICD-10-CM

## 2019-02-02 DIAGNOSIS — I82502 Chronic embolism and thrombosis of unspecified deep veins of left lower extremity: Secondary | ICD-10-CM | POA: Diagnosis not present

## 2019-02-02 DIAGNOSIS — E0590 Thyrotoxicosis, unspecified without thyrotoxic crisis or storm: Secondary | ICD-10-CM | POA: Diagnosis not present

## 2019-02-02 DIAGNOSIS — J410 Simple chronic bronchitis: Secondary | ICD-10-CM

## 2019-02-02 DIAGNOSIS — I10 Essential (primary) hypertension: Secondary | ICD-10-CM | POA: Diagnosis not present

## 2019-02-02 DIAGNOSIS — I2782 Chronic pulmonary embolism: Secondary | ICD-10-CM

## 2019-02-02 DIAGNOSIS — K562 Volvulus: Secondary | ICD-10-CM

## 2019-02-02 DIAGNOSIS — I69391 Dysphagia following cerebral infarction: Secondary | ICD-10-CM

## 2019-02-02 DIAGNOSIS — I6389 Other cerebral infarction: Secondary | ICD-10-CM | POA: Diagnosis not present

## 2019-02-02 DIAGNOSIS — Z7901 Long term (current) use of anticoagulants: Secondary | ICD-10-CM | POA: Diagnosis not present

## 2019-02-02 DIAGNOSIS — I679 Cerebrovascular disease, unspecified: Secondary | ICD-10-CM

## 2019-02-02 NOTE — Progress Notes (Signed)
Location:    [place] Room Number: 112/W Place of Service:  SNF (31)   CODE STATUS: Full Code  Allergies  Allergen Reactions  . Penicillins     Has patient had a PCN reaction causing immediate rash, facial/tongue/throat swelling, SOB or lightheadedness with hypotension: unknown Has patient had a PCN reaction causing severe rash involving mucus membranes or skin necrosis: unknown Has patient had a PCN reaction that required hospitalization: unknown Has patient had a PCN reaction occurring within the last 10 years: unknown If all of the above answers are "NO", then may proceed with Cephalosporin use. 5/3 Tolerated Cefepime x 1 dose in ED.     Chief Complaint  Patient presents with  . Annual Exam        HPI:  He is a 73 year old long term resident of this facility being seen for his annual comprehensive exam. He has not had any falls over the past year. He has not been hospitalized over the past year. He has been treated for aspiration pneumonia several times. He continues to require honey thick liquids. He has gained 20 pounds since Oct 2019. He denies any cough or shortness of breath. He denies any excessive thirst or hunger. He denies any anxiety or depressive thoughts. He continue to be followed for his chronic illnesses including: chronic bronchitis; dyslipidemia; cva.   Past Medical History:  Diagnosis Date  . Dysphasia   . Fatty liver   . Hemiplegia ([place])   . Hypertension   . Pneumonia   . Pulmonary embolism ([place])   . 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([place])   . Stroke [facility])     Past Surgical History:  Procedure Laterality Date  . FLEXIBLE SIGMOIDOSCOPY N/A 10/21/2016   Procedure: FLEXIBLE SIGMOIDOSCOPY;  Surgeon: Rogene Houston, MD;  [facility];  Service: Endoscopy;  Laterality: N/A;  . FLEXIBLE SIGMOIDOSCOPY N/A 10/25/2016   Procedure: FLEXIBLE SIGMOIDOSCOPY;  Surgeon: Rogene Houston, MD;  [facility];  Service: Endoscopy;   Laterality: N/A;  . unable      Social History   Socioeconomic History  . Marital status: Single    Spouse name: Not on file  . Number of children: Not on file  . Years of education: Not on file  . Highest education level: Not on file  Occupational History  . Not on file  Social Needs  . Financial resource strain: Not hard at all  . Food insecurity    Worry: Never true    Inability: Never true  . Transportation needs    Medical: No    Non-medical: No  Tobacco Use  . Smoking status: Never Smoker  . Smokeless tobacco: Never Used  Substance and Sexual Activity  . Alcohol use: No    Alcohol/week: 0.0 standard drinks  . Drug use: No  . Sexual activity: Not on file  Lifestyle  . Physical activity    Days per week: 0 days    Minutes per session: 0 min  . Stress: Not at all  Relationships  . Social Herbalist on phone: Never    Gets together: Once a week    Attends religious service: Never    Active member of club or organization: No    Attends meetings of clubs or organizations: Never    Relationship status: Never married  . Intimate partner violence    Fear of current or ex partner: No    Emotionally abused: No  Physically abused: No    Forced sexual activity: No  Other Topics Concern  . Not on file  Social History Narrative  . Not on file   History reviewed. No pertinent family history.    VITAL SIGNS BP (!) 152/99   Pulse 73   Temp 98.5 F (36.9 C) (Oral)   Resp 15   Ht 5' 8.5" (1.74 m)   Wt 226 lb 8 oz (102.7 kg)   SpO2 91%   BMI 33.94 kg/m   Outpatient Encounter Medications as of 02/02/2019  Medication Sig  . acetaminophen (TYLENOL) 325 MG tablet Take 650 mg by mouth every 6 (six) hours as needed for moderate pain.   Marland Kitchen. apixaban (ELIQUIS) 5 MG TABS tablet Take 5 mg by mouth 2 (two) times daily.  Marland Kitchen. atorvastatin (LIPITOR) 10 MG tablet Take 10 mg by mouth daily.  Marland Kitchen. azithromycin (ZITHROMAX) 500 MG tablet Take 500 mg by mouth daily.  .  budesonide-formoterol (SYMBICORT) 80-4.5 MCG/ACT inhaler Inhale 2 puffs into the lungs 2 (two) times daily.  . carboxymethylcellulose (REFRESH PLUS) 0.5 % SOLN Place 1 drop into the right eye 3 (three) times daily.   Marland Kitchen. erythromycin ophthalmic ointment Place into the left eye. appy 1 ribbon ophthalmic (eye) 3 times a day  . hydrochlorothiazide (HYDRODIURIL) 12.5 MG tablet Take 12.5 mg by mouth daily.  Marland Kitchen. ipratropium-albuterol (DUONEB) 0.5-2.5 (3) MG/3ML SOLN Take 3 mLs by nebulization every 6 (six) hours as needed.  . labetalol (NORMODYNE) 200 MG tablet Take 200 mg by mouth daily. For HTN  . loratadine (CLARITIN) 10 MG tablet Take 10 mg by mouth daily.  . meclizine (ANTIVERT) 12.5 MG tablet Take 12.5 mg by mouth every 8 (eight) hours as needed for dizziness.  . methimazole (TAPAZOLE) 5 MG tablet Take 5 mg by mouth daily.  . NON FORMULARY Diet Type:  Dysphagia 1 diet with honey thick liquids, NAS  . OXYGEN Inhale 2 L/min into the lungs continuous.  . Probiotic Product (RISA-BID PROBIOTIC PO) Take 1 tablet by mouth twice a day  . sennosides-docusate sodium (SENOKOT-S) 8.6-50 MG tablet Take 1 tablet by mouth at bedtime. For constipation  . umeclidinium bromide (INCRUSE ELLIPTA) 62.5 MCG/INH AEPB Inhale 1 puff into the lungs daily.  . valsartan (DIOVAN) 80 MG tablet Take 80 mg by mouth daily.   No facility-administered encounter medications on file as of 02/02/2019.      SIGNIFICANT DIAGNOSTIC EXAMS   PREVIOUS;   01-16-19: chest x-ray: Infiltrate versus atelectasis at LEFT base   NO NEW EXAMS.    LABS REVIEWED: PREVIOUS   04-24-18: tsh 3.326 free T 4: 0.84 05-04-18: glucose 85; bun 31; creat 1.08 k+ 4.0;  na++ 138 ca 8.6; liver normal albumin 3.4 chol 107; ldl 54; trig 50; hdl 43  06-02-18: glucose 88; bun 25; creat 1.17; k+ 4.0; na++ 137; ca 8.7  07-28-18: tsh 0.299; free T3: 2.8; free T4: 0.92 08-17-18: INR 2.5  09-14-18: INR 2.7 10-05-18: INR 3.3 10-12-18: INR 1.9  10-19-18: INR 2.9  10-26-18: wbc 8.8; hgb 15.7; hct 49.2; mcv 95.7; plt 240; glucose 74; bun 25; creat 1.05; k+ 3.9; na++ 141; ca 8.9 INR 2.1  11-27-18: INR 2.7 tsh 0.051 free T3: 3.2; free T4: 1.16 12-11-18: INR 3.3 12-13-18: INR 2.7 12-20-18: INR 2.4 tsh 0.444 free T3: 2.4 free T4: 0.90 01-02-19: INR 2.9  01-09-19: INR 3.5 01-16-19: INR 4.4 01-18-19: INR 3.3  01-23-19: INR 1.4   NO NEW LABS.    Review of Systems  Constitutional: Negative for malaise/fatigue.  Respiratory: Negative for cough and shortness of breath.   Cardiovascular: Negative for chest pain, palpitations and leg swelling.  Gastrointestinal: Negative for abdominal pain, constipation and heartburn.  Musculoskeletal: Negative for back pain, joint pain and myalgias.  Skin: Negative.   Neurological: Negative for dizziness.  Psychiatric/Behavioral: The patient is not nervous/anxious.     Physical Exam Constitutional:      General: He is not in acute distress.    Appearance: He is well-developed. He is obese. He is not diaphoretic.  HENT:     Right Ear: External ear normal.     Left Ear: External ear normal.     Nose: Nose normal.     Mouth/Throat:     Mouth: Mucous membranes are moist.     Pharynx: Oropharynx is clear.  Neck:     Musculoskeletal: Neck supple.     Thyroid: No thyromegaly.  Cardiovascular:     Rate and Rhythm: Normal rate and regular rhythm.     Pulses: Normal pulses.     Heart sounds: Normal heart sounds.  Pulmonary:     Effort: Pulmonary effort is normal. No respiratory distress.     Breath sounds: Normal breath sounds.  Abdominal:     General: Bowel sounds are normal. There is no distension.     Palpations: Abdomen is soft.     Tenderness: There is no abdominal tenderness.  Musculoskeletal:     Right lower leg: No edema.     Left lower leg: No edema.     Comments: Right hemiplegia Is able to move left extremities     Lymphadenopathy:     Cervical: No cervical adenopathy.  Skin:    General: Skin is warm and dry.      Comments: Bilateral lower extremities are discolored   Neurological:     Mental Status: He is alert. Mental status is at baseline.  Psychiatric:        Mood and Affect: Mood normal.     ASSESSMENT/ PLAN:  TODAY:  1. Sigmoid volvulus: is stable will continue senna s daily   2. 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 Continues on zithromax for aspiration pneumonia. Will continue honey thick liquids.   3. Deep vein thrombosis (DVT) of left extremity unspecified vein/ other chronic pulmonary embolism without acute cor pulmonale: is stable is on long term elqiuds 5 mg twice daily   4. Essential hypertension: is stable b/p 112/70 (repeated b/p) will continue diovan 80 mg daily and hctz 12.5 mg daily  5. 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 Dyslipidemia: is stable LDL 54 will continue lipitor 10 mg daily   7. Hyperthyroidism: is stable tsh 0.051 free t3: 3.2 free t4: 1.16 will continue tapazole 5 mg daily   8. Simple chronic bronchitis is stable will continue symbicort 80/4.5 mcg 2 puffs twice daily claritin 10 mg daily incruse ellipta 62.5 mcg daily and has duonebs every 6 hour as needed.    Will order: cbc; cmp lipids psa   MD is aware of resident's narcotic use and is in agreement with current plan of care. We will attempt to wean resident as apropriate   Synthia Innocenteborah Istvan Behar NP [facility]  Contact [phone] Monday through Friday 8am- 5pm  After hours call [phone]

## 2019-02-05 ENCOUNTER — Encounter (HOSPITAL_COMMUNITY)
Admission: RE | Admit: 2019-02-05 | Discharge: 2019-02-05 | Disposition: A | Discharge status: Home / Self Care | Payer: Medicare Other | Source: Skilled Nursing Facility | Attending: *Deleted | Admitting: *Deleted

## 2019-02-05 DIAGNOSIS — R279 Unspecified lack of coordination: Secondary | ICD-10-CM | POA: Diagnosis not present

## 2019-02-05 DIAGNOSIS — R0981 Nasal congestion: Secondary | ICD-10-CM | POA: Diagnosis not present

## 2019-02-05 DIAGNOSIS — H179 Unspecified corneal scar and opacity: Secondary | ICD-10-CM | POA: Diagnosis not present

## 2019-02-05 DIAGNOSIS — I10 Essential (primary) hypertension: Secondary | ICD-10-CM | POA: Diagnosis not present

## 2019-02-05 DIAGNOSIS — E058 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: Secondary | ICD-10-CM | POA: Diagnosis not present

## 2019-02-05 DIAGNOSIS — K5900 Constipation, unspecified: Secondary | ICD-10-CM | POA: Diagnosis not present

## 2019-02-05 LAB — COMPREHENSIVE METABOLIC PANEL
ALT: 17 U/L (ref 0–44)
AST: 17 U/L (ref 15–41)
Albumin: 3.2 g/dL — ABNORMAL LOW (ref 3.5–5.0)
Alkaline Phosphatase: 50 U/L (ref 38–126)
Anion gap: 12 (ref 5–15)
BUN: 46 mg/dL — ABNORMAL HIGH (ref 8–23)
CO2: 26 mmol/L (ref 22–32)
Calcium: 8.7 mg/dL — ABNORMAL LOW (ref 8.9–10.3)
Chloride: 101 mmol/L (ref 98–111)
Creatinine, Ser: 1.54 mg/dL — ABNORMAL HIGH (ref 0.61–1.24)
GFR calc Af Amer: 51 mL/min — ABNORMAL LOW (ref >60)
GFR calc non Af Amer: 44 mL/min — ABNORMAL LOW (ref >60)
Glucose, Bld: 99 mg/dL (ref 70–99)
Potassium: 4 mmol/L (ref 3.5–5.1)
Sodium: 139 mmol/L (ref 135–145)
Total Bilirubin: 0.4 mg/dL (ref 0.3–1.2)
Total Protein: 7.2 g/dL (ref 6.5–8.1)

## 2019-02-05 LAB — CBC
HCT: 48.2 % (ref 39.0–52.0)
Hemoglobin: 15.3 g/dL (ref 13.0–17.0)
MCH: 30.6 pg (ref 26.0–34.0)
MCHC: 31.7 g/dL (ref 30.0–36.0)
MCV: 96.4 fL (ref 80.0–100.0)
Platelets: 236 10*3/uL (ref 150–400)
RBC: 5 MIL/uL (ref 4.22–5.81)
RDW: 14.5 % (ref 11.5–15.5)
WBC: 9.9 10*3/uL (ref 4.0–10.5)
nRBC: 0 % (ref 0.0–0.2)

## 2019-02-05 LAB — LIPID PANEL
Cholesterol: 106 mg/dL (ref 0–200)
HDL: 33 mg/dL — ABNORMAL LOW (ref >40)
LDL Cholesterol: 62 mg/dL (ref 0–99)
Total CHOL/HDL Ratio: 3.2 RATIO
Triglycerides: 54 mg/dL (ref <150)
VLDL: 11 mg/dL (ref 0–40)

## 2019-02-05 LAB — PSA: Prostatic Specific Antigen: 10.58 ng/mL — ABNORMAL HIGH (ref 0.00–4.00)

## 2019-02-06 ENCOUNTER — Encounter: Payer: Self-pay | Admitting: Adult Health

## 2019-02-06 ENCOUNTER — Non-Acute Institutional Stay (SKILLED_NURSING_FACILITY): Payer: Medicare Other | Admitting: Adult Health

## 2019-02-06 DIAGNOSIS — R972 Elevated prostate specific antigen [PSA]: Secondary | ICD-10-CM | POA: Diagnosis not present

## 2019-02-06 NOTE — Progress Notes (Signed)
Location:   [place] Room Number: [place] of Service:  SNF (31)   CODE STATUS: Full code  Allergies  Allergen Reactions  . Penicillins     Has patient had a PCN reaction causing immediate rash, facial/tongue/throat swelling, SOB or lightheadedness with hypotension: unknown Has patient had a PCN reaction causing severe rash involving mucus membranes or skin necrosis: unknown Has patient had a PCN reaction that required hospitalization: unknown Has patient had a PCN reaction occurring within the last 10 years: unknown If all of the above answers are "NO", then may proceed with Cephalosporin use. 5/3 Tolerated Cefepime x 1 dose in ED.     Chief Complaint  Patient presents with  . Acute Visit    Lab Follow up    HPI:  His PSA is elevated at 10.58. he denies any issues with urination. Denies any pain or difficulty. We have discussed his test results. He is willing to go to urology for a further workup.   Past Medical History:  Diagnosis Date  . Dysphasia   . Fatty liver   . Hemiplegia ([place])   . Hypertension   . Pneumonia   . Pulmonary embolism ([place])   . 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([place])   . Stroke [facility])     Past Surgical History:  Procedure Laterality Date  . FLEXIBLE SIGMOIDOSCOPY N/A 10/21/2016   Procedure: FLEXIBLE SIGMOIDOSCOPY;  Surgeon: Rogene Houston, MD;  [facility];  Service: Endoscopy;  Laterality: N/A;  . FLEXIBLE SIGMOIDOSCOPY N/A 10/25/2016   Procedure: FLEXIBLE SIGMOIDOSCOPY;  Surgeon: Rogene Houston, MD;  [facility];  Service: Endoscopy;  Laterality: N/A;  . unable      Social History   Socioeconomic History  . Marital status: Single    Spouse name: Not on file  . Number of children: Not on file  . Years of education: Not on file  . Highest education level: Not on file  Occupational History  . Not on file  Social Needs  . Financial resource strain: Not hard at all  . Food insecurity    Worry:  Never true    Inability: Never true  . Transportation needs    Medical: No    Non-medical: No  Tobacco Use  . Smoking status: Never Smoker  . Smokeless tobacco: Never Used  Substance and Sexual Activity  . Alcohol use: No    Alcohol/week: 0.0 standard drinks  . Drug use: No  . Sexual activity: Not on file  Lifestyle  . Physical activity    Days per week: 0 days    Minutes per session: 0 min  . Stress: Not at all  Relationships  . Social Herbalist on phone: Never    Gets together: Once a week    Attends religious service: Never    Active member of club or organization: No    Attends meetings of clubs or organizations: Never    Relationship status: Never married  . Intimate partner violence    Fear of current or ex partner: No    Emotionally abused: No    Physically abused: No    Forced sexual activity: No  Other Topics Concern  . Not on file  Social History Narrative  . Not on file   History reviewed. No pertinent family history.    VITAL SIGNS BP 115/80   Pulse 72   Temp 98.5 F (36.9 C)   Resp 16  Ht 5' 8.5" (1.74 m)   Wt 226 lb 8 oz (102.7 kg)   BMI 33.94 kg/m   Outpatient Encounter Medications as of 02/06/2019  Medication Sig  . acetaminophen (TYLENOL) 325 MG tablet Take 650 mg by mouth every 6 (six) hours as needed for moderate pain.   Marland Kitchen. apixaban (ELIQUIS) 5 MG TABS tablet Take 5 mg by mouth 2 (two) times daily.  Marland Kitchen. atorvastatin (LIPITOR) 10 MG tablet Take 10 mg by mouth daily.  . budesonide-formoterol (SYMBICORT) 80-4.5 MCG/ACT inhaler Inhale 2 puffs into the lungs 2 (two) times daily.  . carboxymethylcellulose (REFRESH PLUS) 0.5 % SOLN Place 1 drop into the right eye 3 (three) times daily.   Marland Kitchen. erythromycin ophthalmic ointment Place into the left eye. appy 1 ribbon ophthalmic (eye) 3 times a day  . hydrochlorothiazide (HYDRODIURIL) 12.5 MG tablet Take 12.5 mg by mouth daily.  Marland Kitchen. ipratropium-albuterol (DUONEB) 0.5-2.5 (3) MG/3ML SOLN Take 3 mLs  by nebulization every 6 (six) hours as needed.  . labetalol (NORMODYNE) 200 MG tablet Take 200 mg by mouth daily. For HTN  . loratadine (CLARITIN) 10 MG tablet Take 10 mg by mouth daily.  . meclizine (ANTIVERT) 12.5 MG tablet Take 12.5 mg by mouth every 8 (eight) hours as needed for dizziness.  . methimazole (TAPAZOLE) 5 MG tablet Take 5 mg by mouth daily.  . NON FORMULARY Diet Type:  Dysphagia 1 diet with honey thick liquids, NAS  . OXYGEN Inhale 2 L/min into the lungs continuous.  . sennosides-docusate sodium (SENOKOT-S) 8.6-50 MG tablet Take 1 tablet by mouth at bedtime. For constipation  . umeclidinium bromide (INCRUSE ELLIPTA) 62.5 MCG/INH AEPB Inhale 1 puff into the lungs daily.  . valsartan (DIOVAN) 80 MG tablet Take 80 mg by mouth daily.   No facility-administered encounter medications on file as of 02/06/2019.      SIGNIFICANT DIAGNOSTIC EXAMS  PREVIOUS;   01-16-19: chest x-ray: Infiltrate versus atelectasis at LEFT base   NO NEW EXAMS.    LABS REVIEWED: PREVIOUS   04-24-18: tsh 3.326 free T 4: 0.84 05-04-18: glucose 85; bun 31; creat 1.08 k+ 4.0;  na++ 138 ca 8.6; liver normal albumin 3.4 chol 107; ldl 54; trig 50; hdl 43  06-02-18: glucose 88; bun 25; creat 1.17; k+ 4.0; na++ 137; ca 8.7  07-28-18: tsh 0.299; free T3: 2.8; free T4: 0.92 08-17-18: INR 2.5  09-14-18: INR 2.7 10-05-18: INR 3.3 10-12-18: INR 1.9  10-19-18: INR 2.9 10-26-18: wbc 8.8; hgb 15.7; hct 49.2; mcv 95.7; plt 240; glucose 74; bun 25; creat 1.05; k+ 3.9; na++ 141; ca 8.9 INR 2.1  11-27-18: INR 2.7 tsh 0.051 free T3: 3.2; free T4: 1.16 12-11-18: INR 3.3 12-13-18: INR 2.7 12-20-18: INR 2.4 tsh 0.444 free T3: 2.4 free T4: 0.90 01-02-19: INR 2.9  01-09-19: INR 3.5 01-16-19: INR 4.4 01-18-19: INR 3.3  01-23-19: INR 1.4   TODAY;   02-05-19: wbc 9.9; hgb 15.3; hct 482; mcv 96.4; plt 236; glucose 99; bun 46; creat 1.54; k+ 4.0; na++ 139; ca 8.7; liver normal albumin 3.2; chol 106; ldl 62; trig 54; hdl 33; PSA 10.58     Review of Systems  Constitutional: Negative for malaise/fatigue.  Respiratory: Negative for cough and shortness of breath.   Cardiovascular: Negative for chest pain, palpitations and leg swelling.  Gastrointestinal: Negative for abdominal pain, constipation and heartburn.  Musculoskeletal: Negative for back pain, joint pain and myalgias.  Skin: Negative.   Neurological: Negative for dizziness.  Psychiatric/Behavioral: The patient is  not nervous/anxious.       Physical Exam Constitutional:      General: He is not in acute distress.    Appearance: He is well-developed. He is obese. He is not diaphoretic.  Neck:     Musculoskeletal: Neck supple.     Thyroid: No thyromegaly.  Cardiovascular:     Rate and Rhythm: Normal rate and regular rhythm.     Pulses: Normal pulses.     Heart sounds: Normal heart sounds.  Pulmonary:     Effort: Pulmonary effort is normal. No respiratory distress.     Breath sounds: Normal breath sounds.  Abdominal:     General: Bowel sounds are normal. There is no distension.     Palpations: Abdomen is soft.     Tenderness: There is no abdominal tenderness.  Musculoskeletal:     Right lower leg: No edema.     Left lower leg: No edema.     Comments: Right hemiplegia Is able to move left extremities      Lymphadenopathy:     Cervical: No cervical adenopathy.  Skin:    General: Skin is warm and dry.     Comments: Bilateral lower extremities discolored   Neurological:     Mental Status: He is alert.  Psychiatric:        Mood and Affect: Mood normal.     ASSESSMENT/ PLAN:  TODAY:  1.  Elevated PSA between 10 and less than 20: will setup a urology consult. Sheila verbalized understanding of upcoming consult.    MD is aware of resident's narcotic use and is in agreement with current plan of care. We will attempt to wean resident as apropriate   Synthia Innocenteborah Green NP [facility]  Contact [phone] Monday through Friday 8am- 5pm  After  hours call [phone]

## 2019-02-08 DIAGNOSIS — R972 Elevated prostate specific antigen [PSA]: Secondary | ICD-10-CM | POA: Insufficient documentation

## 2019-03-09 ENCOUNTER — Encounter: Payer: Self-pay | Admitting: Adult Health

## 2019-03-09 ENCOUNTER — Non-Acute Institutional Stay (SKILLED_NURSING_FACILITY): Payer: Medicare Other | Admitting: Adult Health

## 2019-03-09 DIAGNOSIS — K562 Volvulus: Secondary | ICD-10-CM

## 2019-03-09 DIAGNOSIS — I2782 Chronic pulmonary embolism: Secondary | ICD-10-CM | POA: Diagnosis not present

## 2019-03-09 DIAGNOSIS — I82502 Chronic embolism and thrombosis of unspecified deep veins of left lower extremity: Secondary | ICD-10-CM

## 2019-03-09 DIAGNOSIS — I69391 Dysphagia following cerebral infarction: Secondary | ICD-10-CM

## 2019-03-09 NOTE — Progress Notes (Signed)
Location:  [facility] Room Number: 112-W Place of Service:  SNF (31)   CODE STATUS: Full Code  Allergies  Allergen Reactions  . Penicillins     Has patient had a PCN reaction causing immediate rash, facial/tongue/throat swelling, SOB or lightheadedness with hypotension: unknown Has patient had a PCN reaction causing severe rash involving mucus membranes or skin necrosis: unknown Has patient had a PCN reaction that required hospitalization: unknown Has patient had a PCN reaction occurring within the last 10 years: unknown If all of the above answers are "NO", then may proceed with Cephalosporin use. 5/3 Tolerated Cefepime x 1 dose in ED.     Chief Complaint  Patient presents with  . 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 He denies any uncontrolled pain; no changes in his appetite; no insomnia; no anxiety    Past Medical History:  Diagnosis Date  . Dysphasia   . Fatty liver   . Hemiplegia (HCC)   . Hypertension   . Pneumonia   . Pulmonary embolism (HCC)   . 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(HCC)   . Stroke [facility](HCC)     Past Surgical History:  Procedure Laterality Date  . FLEXIBLE SIGMOIDOSCOPY N/A 10/21/2016   Procedure: FLEXIBLE SIGMOIDOSCOPY;  Surgeon: Malissa Hippoehman, Najeeb U, MD;  [facility];  Service: Endoscopy;  Laterality: N/A;  . FLEXIBLE SIGMOIDOSCOPY N/A 10/25/2016   Procedure: FLEXIBLE SIGMOIDOSCOPY;  Surgeon: Malissa Hippoehman, Najeeb U, MD;  [facility];  Service: Endoscopy;  Laterality: N/A;  . unable      Social History   Socioeconomic History  . Marital status: Single    Spouse name: Not on file  .  Number of children: Not on file  . Years of education: Not on file  . Highest education level: Not on file  Occupational History  . Not on file  Social Needs  . Financial resource strain: Not hard at all  . Food insecurity    Worry: Never true    Inability: Never true  . Transportation needs    Medical: No    Non-medical: No  Tobacco Use  . Smoking status: Never Smoker  . Smokeless tobacco: Never Used  Substance and Sexual Activity  . Alcohol use: No    Alcohol/week: 0.0 standard drinks  . Drug use: No  . Sexual activity: Not on file  Lifestyle  . Physical activity    Days per week: 0 days    Minutes per session: 0 min  . Stress: Not at all  Relationships  . Social Musicianconnections    Talks on phone: Never    Gets together: Once a week    Attends religious service: Never    Active member of club or organization: No    Attends meetings of clubs or organizations: Never    Relationship status: Never married  . Intimate partner violence    Fear of current or ex partner: No    Emotionally abused: No    Physically abused: No    Forced sexual activity: No  Other Topics Concern  . Not on file  Social History Narrative  . Not  on file   History reviewed. No pertinent family history.    VITAL SIGNS BP 113/78   Pulse 70   Temp 99.1 F (37.3 C) (Oral)   Resp 15   Ht 5\' 8"  (1.727 m)   Wt 231 lb 6.4 oz (105 kg)   SpO2 91%   BMI 35.18 kg/m   Outpatient Encounter Medications as of 03/09/2019  Medication Sig  . acetaminophen (TYLENOL) 325 MG tablet Take 650 mg by mouth every 6 (six) hours as needed for moderate pain.   Marland Kitchen apixaban (ELIQUIS) 5 MG TABS tablet Take 5 mg by mouth 2 (two) times daily.  Marland Kitchen atorvastatin (LIPITOR) 10 MG tablet Take 10 mg by mouth daily.  . budesonide-formoterol (SYMBICORT) 80-4.5 MCG/ACT inhaler Inhale 2 puffs into the lungs 2 (two) times daily.  . carboxymethylcellulose (REFRESH PLUS) 0.5 % SOLN Place 1 drop into the right eye 3 (three) times daily.    Marland Kitchen erythromycin ophthalmic ointment Place into the left eye. appy 1 ribbon ophthalmic (eye) 3 times a day  . hydrochlorothiazide (HYDRODIURIL) 12.5 MG tablet Take 12.5 mg by mouth daily.  Marland Kitchen ipratropium-albuterol (DUONEB) 0.5-2.5 (3) MG/3ML SOLN Take 3 mLs by nebulization every 6 (six) hours as needed.  . labetalol (NORMODYNE) 200 MG tablet Take 200 mg by mouth daily. For HTN  . loratadine (CLARITIN) 10 MG tablet Take 10 mg by mouth daily.  . meclizine (ANTIVERT) 12.5 MG tablet Take 12.5 mg by mouth every 8 (eight) hours as needed for dizziness.  . methimazole (TAPAZOLE) 5 MG tablet Take 5 mg by mouth daily.  . NON FORMULARY Diet Type:  Dysphagia 1 diet with honey thick liquids, NAS  . OXYGEN Inhale 2 L/min into the lungs continuous.  . sennosides-docusate sodium (SENOKOT-S) 8.6-50 MG tablet Take 1 tablet by mouth at bedtime. For constipation  . umeclidinium bromide (INCRUSE ELLIPTA) 62.5 MCG/INH AEPB Inhale 1 puff into the lungs daily.  . valsartan (DIOVAN) 80 MG tablet Take 80 mg by mouth daily.   No facility-administered encounter medications on file as of 03/09/2019.      SIGNIFICANT DIAGNOSTIC EXAMS   PREVIOUS;   01-16-19: chest x-ray: Infiltrate versus atelectasis at LEFT base   NO NEW EXAMS.    LABS REVIEWED: PREVIOUS   04-24-18: tsh 3.326 free T 4: 0.84 05-04-18: glucose 85; bun 31; creat 1.08 k+ 4.0;  na++ 138 ca 8.6; liver normal albumin 3.4 chol 107; ldl 54; trig 50; hdl 43  06-02-18: glucose 88; bun 25; creat 1.17; k+ 4.0; na++ 137; ca 8.7  07-28-18: tsh 0.299; free T3: 2.8; free T4: 0.92 08-17-18: INR 2.5  09-14-18: INR 2.7 10-05-18: INR 3.3 10-12-18: INR 1.9  10-19-18: INR 2.9 10-26-18: wbc 8.8; hgb 15.7; hct 49.2; mcv 95.7; plt 240; glucose 74; bun 25; creat 1.05; k+ 3.9; na++ 141; ca 8.9 INR 2.1  11-27-18: INR 2.7 tsh 0.051 free T3: 3.2; free T4: 1.16 12-11-18: INR 3.3 12-13-18: INR 2.7 12-20-18: INR 2.4 tsh 0.444 free T3: 2.4 free T4: 0.90 01-02-19: INR 2.9  01-09-19: INR 3.5  01-16-19: INR 4.4 01-18-19: INR 3.3  01-23-19: INR 1.4  02-05-19: wbc 9.9; hgb 15.3; hct 482; mcv 96.4; plt 236; glucose 99; bun 46; creat 1.54; k+ 4.0; na++ 139; ca 8.7; liver normal albumin 3.2; chol 106; ldl 62; trig 54; hdl 33; PSA 10.58 (scheduled for 03-31-19)    NO NEW LABS.   Review of Systems  Constitutional: Negative for malaise/fatigue.  Respiratory: Negative for cough and shortness of breath.  Cardiovascular: Negative for chest pain, palpitations and leg swelling.  Gastrointestinal: Negative for abdominal pain, constipation and heartburn.  Musculoskeletal: Negative for back pain, joint pain and myalgias.  Skin: Negative.   Neurological: Negative for dizziness.  Psychiatric/Behavioral: The patient is not nervous/anxious.     Physical Exam Constitutional:      General: He is not in acute distress.    Appearance: He is well-developed. He is obese. He is not diaphoretic.  Neck:     Musculoskeletal: Neck supple.     Thyroid: No thyromegaly.  Cardiovascular:     Rate and Rhythm: Normal rate and regular rhythm.     Pulses: Normal pulses.     Heart sounds: Normal heart sounds.  Pulmonary:     Effort: Pulmonary effort is normal. No respiratory distress.     Breath sounds: Normal breath sounds.  Abdominal:     General: Bowel sounds are normal. There is no distension.     Palpations: Abdomen is soft.     Tenderness: There is no abdominal tenderness.  Musculoskeletal:     Right lower leg: No edema.     Left lower leg: No edema.     Comments:  Right hemiplegia Is able to move left extremities       Lymphadenopathy:     Cervical: No cervical adenopathy.  Skin:    General: Skin is warm and dry.     Comments: Bilateral lower extremities discolored    Neurological:     Mental Status: He is alert. Mental status is at baseline.  Psychiatric:        Mood and Affect: Mood normal.        ASSESSMENT/ PLAN:  TODAY:  1. Sigmoid volvulus: is stable will continue senna s  daily  2. 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 Deep vein thrombosis (DVT) of left extremity unspecified/other chronic pulmonary embolism without acute cor pulmonale:  Is stable is on long term eliquis 5 mg twice daily     PREVIOUS  4. Essential hypertension: is stable b/p 113/78 will continue diovan 80 mg daily and hctz 12.5 mg daily  5. 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 Dyslipidemia: is stable LDL 54 will continue lipitor 10 mg daily   7. Hyperthyroidism: is stable tsh 0.051 free t3: 3.2 free t4: 1.16 will continue tapazole 5 mg daily   8. Simple chronic bronchitis is stable will continue symbicort 80/4.5 mcg 2 puffs twice daily claritin 10 mg daily incruse ellipta 62.5 mcg daily and has duonebs every 6 hour as needed.     MD is aware of resident's narcotic use and is in agreement with current plan of care. We will attempt to wean resident as appropriate.  Ok Edwards NP [facility]  Contact [phone] Monday through Friday 8am- 5pm  After hours call [phone]

## 2019-03-13 ENCOUNTER — Ambulatory Visit: Payer: Medicare Other | Admitting: Urology

## 2019-03-16 ENCOUNTER — Encounter (HOSPITAL_COMMUNITY)
Admission: RE | Admit: 2019-03-16 | Discharge: 2019-03-16 | Disposition: A | Discharge status: Home / Self Care | Payer: Medicare Other | Source: Skilled Nursing Facility | Attending: Adult Health | Admitting: Adult Health

## 2019-03-16 DIAGNOSIS — E0590 Thyrotoxicosis, unspecified without thyrotoxic crisis or storm: Secondary | ICD-10-CM | POA: Insufficient documentation

## 2019-03-16 LAB — T4, FREE: Free T4: 0.81 ng/dL (ref 0.61–1.12)

## 2019-03-16 LAB — TSH: TSH: 2.668 u[IU]/mL (ref 0.350–4.500)

## 2019-03-23 ENCOUNTER — Ambulatory Visit (INDEPENDENT_AMBULATORY_CARE_PROVIDER_SITE_OTHER): Payer: Medicare Other | Admitting: "Endocrinology

## 2019-03-23 ENCOUNTER — Encounter: Payer: Self-pay | Admitting: "Endocrinology

## 2019-03-23 DIAGNOSIS — E0590 Thyrotoxicosis, unspecified without thyrotoxic crisis or storm: Secondary | ICD-10-CM

## 2019-03-23 DIAGNOSIS — I6389 Other cerebral infarction: Secondary | ICD-10-CM | POA: Diagnosis not present

## 2019-03-23 NOTE — Progress Notes (Signed)
03/23/2019                                 Endocrinology Telehealth Visit Follow up Note -During COVID -19 Pandemic  I connected with [name] [name] on 03/23/2019   by telephone and verified that I am speaking with the correct person using two identifiers. [name] [name], 1946/01/03. he has verbally consented to this visit. All issues noted in this document were discussed and addressed. The format was not optimal for physical exam   Subjective:    Patient ID: [name] [name], male    DOB: [birth year], PCP Margit Hanks, MD   Past Medical History:  Diagnosis Date  . Dysphasia   . Fatty liver   . Hemiplegia (HCC)   . Hypertension   . Pneumonia   . Pulmonary embolism (HCC)   . 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(HCC)   . Stroke [facility])    Past Surgical History:  Procedure Laterality Date  . FLEXIBLE SIGMOIDOSCOPY N/A 10/21/2016   Procedure: FLEXIBLE SIGMOIDOSCOPY;  Surgeon: Malissa Hippo, MD;  [facility];  Service: Endoscopy;  Laterality: N/A;  . FLEXIBLE SIGMOIDOSCOPY N/A 10/25/2016   Procedure: FLEXIBLE SIGMOIDOSCOPY;  Surgeon: Malissa Hippo, MD;  [facility];  Service: Endoscopy;  Laterality: N/A;  . unable     Social History   Socioeconomic History  . Marital status: Single    Spouse name: Not on file  . Number of children: Not on file  . Years of education: Not on file  . Highest education level: Not on file  Occupational History  . Not on file  Social Needs  . Financial resource strain: Not hard at all  . Food insecurity    Worry: Never true    Inability: Never true  . Transportation needs    Medical: No    Non-medical: No  Tobacco Use  . Smoking status: Never Smoker  . Smokeless tobacco: Never Used  Substance and Sexual Activity  . Alcohol use: No    Alcohol/week: 0.0 standard drinks  . Drug use: No  . Sexual activity: Not on file  Lifestyle  . Physical activity    Days per week: 0 days    Minutes per session: 0 min  . Stress: Not at  all  Relationships  . Social Musician on phone: Never    Gets together: Once a week    Attends religious service: Never    Active member of club or organization: No    Attends meetings of clubs or organizations: Never    Relationship status: Never married  Other Topics Concern  . Not on file  Social History Narrative  . Not on file   Outpatient Encounter Medications as of 03/23/2019  Medication Sig  . acetaminophen (TYLENOL) 325 MG tablet Take 650 mg by mouth every 6 (six) hours as needed for moderate pain.   Marland Kitchen apixaban (ELIQUIS) 5 MG TABS tablet Take 5 mg by mouth 2 (two) times daily.  Marland Kitchen atorvastatin (LIPITOR) 10 MG tablet Take 10 mg by mouth daily.  . budesonide-formoterol (SYMBICORT) 80-4.5 MCG/ACT inhaler Inhale 2 puffs into the lungs 2 (two) times daily.  . carboxymethylcellulose (REFRESH PLUS) 0.5 % SOLN Place 1 drop into the right eye 3 (three) times daily.   Marland Kitchen erythromycin ophthalmic ointment Place into the left eye. appy 1 ribbon ophthalmic (eye) 3 times a day  . hydrochlorothiazide (  HYDRODIURIL) 12.5 MG tablet Take 12.5 mg by mouth daily.  Marland Kitchen ipratropium-albuterol (DUONEB) 0.5-2.5 (3) MG/3ML SOLN Take 3 mLs by nebulization every 6 (six) hours as needed.  . labetalol (NORMODYNE) 200 MG tablet Take 200 mg by mouth daily. For HTN  . loratadine (CLARITIN) 10 MG tablet Take 10 mg by mouth daily.  . meclizine (ANTIVERT) 12.5 MG tablet Take 12.5 mg by mouth every 8 (eight) hours as needed for dizziness.  . methimazole (TAPAZOLE) 5 MG tablet Take 5 mg by mouth daily.  . NON FORMULARY Diet Type:  Dysphagia 1 diet with honey thick liquids, NAS  . OXYGEN Inhale 2 L/min into the lungs continuous.  . sennosides-docusate sodium (SENOKOT-S) 8.6-50 MG tablet Take 1 tablet by mouth at bedtime. For constipation  . umeclidinium bromide (INCRUSE ELLIPTA) 62.5 MCG/INH AEPB Inhale 1 puff into the lungs daily.  . valsartan (DIOVAN) 80 MG tablet Take 80 mg by mouth daily.   No  facility-administered encounter medications on file as of 03/23/2019.    ALLERGIES: Allergies  Allergen Reactions  . Penicillins     Has patient had a PCN reaction causing immediate rash, facial/tongue/throat swelling, SOB or lightheadedness with hypotension: unknown Has patient had a PCN reaction causing severe rash involving mucus membranes or skin necrosis: unknown Has patient had a PCN reaction that required hospitalization: unknown Has patient had a PCN reaction occurring within the last 10 years: unknown If all of the above answers are "NO", then may proceed with Cephalosporin use. 5/3 Tolerated Cefepime x 1 dose in ED.     VACCINATION STATUS: Immunization History  Administered Date(s) Administered  . Influenza-Unspecified 03/28/2014, 03/23/2016, 03/25/2017, 03/23/2018  . Pneumococcal Conjugate-13 04/21/2015  . Pneumococcal-Unspecified 11/01/2009, 03/30/2016  . Tdap 03/17/2017    HPI [name] [name] is 73 y.o. male who is being engaged in telehealth for follow-up of hyperthyroidism.  He is being assisted by his nurse in [place].    -Since his last visit, he was put back on methimazole 5 mg p.o. daily due to relapse of mild hyperthyroidism.   His most recent thyroid function tests are suggestive of treatment response.  He has no new complaints today.  -  He has multiple medical problems including stroke with right-sided hemiplegia, nursing home resident. - He also has dysphagia, was found to be losing weight, currently reversing. - He was sent for thyroid uptake and scan which showed nonfocal elevated uptake of 42%.   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   Review of Systems musculoskeletal: + Wheelchair-bound ,  stiff muscles on extremities.  Skin: no rashes Limited as above.  Objective:    There were no vitals taken for this visit.  Wt Readings from Last 3  Encounters:  03/09/19 231 lb 6.4 oz (105 kg)  02/06/19 226 lb 8 oz (102.7 kg)  02/02/19 226 lb 8 oz (102.7 kg)       Diabetic Labs (most recent): Lab Results  Component Value Date   HGBA1C 6.0 04/26/2009   HGBA1C  03/27/2009    6.0 (NOTE) The ADA recommends the following therapeutic goal for glycemic control related to Hgb A1c measurement: Goal of therapy: <6.5 Hgb A1c  Reference: American Diabetes Association: Clinical Practice Recommendations 2010, Diabetes Care, 2010, 33: (Suppl  1).   HGBA1C 6 06/22/2008     Lipid Panel ( most recent) Lipid Panel     Component Value  Date/Time   CHOL 106 02/05/2019 0620   TRIG 54 02/05/2019 0620   HDL 33 (L) 02/05/2019 0620   CHOLHDL 3.2 02/05/2019 0620   VLDL 11 02/05/2019 0620   LDLCALC 62 02/05/2019 0620   Recent Results (from the past 2160 hour(s))  Protime-INR     Status: Abnormal   Collection Time: 01/02/19  3:35 AM  Result Value Ref Range   Prothrombin Time 30.0 (H) 11.4 - 15.2 seconds   INR 2.9 (H) 0.8 - 1.2    Comment: (NOTE) INR goal varies based on device and disease states. Performed at [facility], [address], [place], [place] [address]   Protime-INR     Status: Abnormal   Collection Time: 01/09/19  4:25 AM  Result Value Ref Range   Prothrombin Time 34.9 (H) 11.4 - 15.2 seconds   INR 3.5 (H) 0.8 - 1.2    Comment: (NOTE) INR goal varies based on device and disease states. Performed at [facility], [address] [place], [place] [address]   Protime-INR     Status: Abnormal   Collection Time: 01/16/19  9:15 AM  Result Value Ref Range   Prothrombin Time 41.2 (H) 11.4 - 15.2 seconds   INR 4.4 (HH) 0.8 - 1.2    Comment: REPEATED TO VERIFY CRITICAL RESULT CALLED TO, READ BACK BY AND VERIFIED WITH: BETTY ASHE AT 0946 BY HFLYNT 01/16/19 (NOTE) INR goal varies based on device and disease states. Performed at [facility], [address] [place], [place] [address]   Protime-INR     Status: Abnormal   Collection  Time: 01/18/19  5:30 AM  Result Value Ref Range   Prothrombin Time 33.4 (H) 11.4 - 15.2 seconds   INR 3.3 (H) 0.8 - 1.2    Comment: (NOTE) INR goal varies based on device and disease states. Performed at [facility], [address] [place], [place] [address]   Protime-INR     Status: Abnormal   Collection Time: 01/23/19  3:12 AM  Result Value Ref Range   Prothrombin Time 17.3 (H) 11.4 - 15.2 seconds   INR 1.4 (H) 0.8 - 1.2    Comment: (NOTE) INR goal varies based on device and disease states. Performed at [facility], [address] [place], [place] [address]   CBC     Status: None   Collection Time: 02/05/19  6:20 AM  Result Value Ref Range   WBC 9.9 4.0 - 10.5 K/uL   RBC 5.00 4.22 - 5.81 MIL/uL   Hemoglobin 15.3 13.0 - 17.0 g/dL   HCT 69.6 29.5 - 28.4 %   MCV 96.4 80.0 - 100.0 fL   MCH 30.6 26.0 - 34.0 pg   MCHC 31.7 30.0 - 36.0 g/dL   RDW 13.2 44.0 - 10.2 %   Platelets 236 150 - 400 K/uL   nRBC 0.0 0.0 - 0.2 %    Comment: Performed at [facility], [address], [place], [place] [address]  Comprehensive metabolic panel     Status: Abnormal   Collection Time: 02/05/19  6:20 AM  Result Value Ref Range   Sodium 139 135 - 145 mmol/L   Potassium 4.0 3.5 - 5.1 mmol/L   Chloride 101 98 - 111 mmol/L   CO2 26 22 - 32 mmol/L   Glucose, Bld 99 70 - 99 mg/dL   BUN 46 (H) 8 - 23 mg/dL   Creatinine, Ser 6.44 (H) 0.61 - 1.24 mg/dL   Calcium 8.7 (L) 8.9 - 10.3  mg/dL   Total Protein 7.2 6.5 - 8.1 g/dL   Albumin 3.2 (L) 3.5 - 5.0 g/dL   AST 17 15 - 41 U/L   ALT 17 0 - 44 U/L   Alkaline Phosphatase 50 38 - 126 U/L   Total Bilirubin 0.4 0.3 - 1.2 mg/dL   GFR calc non Af Amer 44 (L) >60 mL/min   GFR calc Af Amer 51 (L) >60 mL/min   Anion gap 12 5 - 15    Comment: Performed at [facility], [address], [place], [place] [address]  Lipid panel     Status: Abnormal   Collection Time: 02/05/19  6:20 AM  Result Value Ref Range   Cholesterol 106 0 - 200 mg/dL   Triglycerides 54  <332 mg/dL   HDL 33 (L) >95 mg/dL   Total CHOL/HDL Ratio 3.2 RATIO   VLDL 11 0 - 40 mg/dL   LDL Cholesterol 62 0 - 99 mg/dL    Comment:        Total Cholesterol/HDL:CHD Risk Coronary Heart Disease Risk Table                     Men   Women  1/2 Average Risk   3.4   3.3  Average Risk       5.0   4.4  2 X Average Risk   9.6   7.1  3 X Average Risk  23.4   11.0        Use the calculated Patient Ratio above and the CHD Risk Table to determine the patient's CHD Risk.        ATP III CLASSIFICATION (LDL):  <100     mg/dL   Optimal  188-416  mg/dL   Near or Above                    Optimal  130-159  mg/dL   Borderline  606-301  mg/dL   High  >601     mg/dL   Very High Performed at [facility], [address] [place], [place] [address]   PSA     Status: Abnormal   Collection Time: 02/05/19  6:20 AM  Result Value Ref Range   Prostatic Specific Antigen 10.58 (H) 0.00 - 4.00 ng/mL    Comment: (NOTE) While PSA levels of <=4.0 ng/ml are reported as reference range, some men with levels below 4.0 ng/ml can have prostate cancer and many men with PSA above 4.0 ng/ml do not have prostate cancer.  Other tests such as free PSA, age specific reference ranges, PSA velocity and PSA doubling time may be helpful especially in men less than 74 years old. Performed at [facility], [address] [place], [place] [address]   T4, free     Status: None   Collection Time: 03/16/19  9:35 AM  Result Value Ref Range   Free T4 0.81 0.61 - 1.12 ng/dL    Comment: (NOTE) Biotin ingestion may interfere with free T4 tests. If the results are inconsistent with the TSH level, previous test results, or the clinical presentation, then consider biotin interference. If needed, order repeat testing after stopping biotin. Performed at [facility], [address], [place], [place] [address]   TSH     Status: None   Collection Time: 03/16/19  9:35 AM  Result Value Ref Range   TSH 2.668 0.350 -  4.500 uIU/mL    Comment: Performed by a  3rd Generation assay with a functional sensitivity of <=0.01 uIU/mL. Performed at [facility], [address] [place], [place] [id number]        Assessment & Plan:   1.  Hyperthyroidism -Due to relapse of primary hyperthyroidism, he was reinitiated on methimazole 5 mg p.o. daily in the interim.  He will be continued on same.  He will have repeat thyroid function test and office visit in 3 months.   -He is on labetalol 200 mg per daily related to his high blood pressure.  - I advised patient to maintain close follow up with Margit HanksAlexander, Anne D, MD for  primary care needs.    Time for this visit: 15 minutes. Raymond DesanctisScoville T Omahoney  participated in the discussions, expressed understanding, and voiced agreement with the above plans.  All questions were answered to his satisfaction. he is encouraged to contact clinic should he have any questions or concerns prior to his return visit.  Follow up plan: Return in about 10 weeks (around 06/01/2019) for Follow up with Pre-visit Labs.  Marquis LunchGebre Olaoluwa Grieder, MD [facility] Phone: [phone]  Fax: [phone]   03/23/2019, 11:44 AM This note was partially dictated with voice recognition software. Similar sounding words can be transcribed inadequately or may not  be corrected upon review.

## 2019-03-26 ENCOUNTER — Encounter (HOSPITAL_COMMUNITY)
Admission: RE | Admit: 2019-03-26 | Discharge: 2019-03-26 | Disposition: A | Discharge status: Home / Self Care | Payer: Medicare Other | Source: Skilled Nursing Facility | Attending: Adult Health | Admitting: Adult Health

## 2019-03-26 DIAGNOSIS — E0590 Thyrotoxicosis, unspecified without thyrotoxic crisis or storm: Secondary | ICD-10-CM | POA: Insufficient documentation

## 2019-04-04 ENCOUNTER — Non-Acute Institutional Stay (SKILLED_NURSING_FACILITY): Payer: Medicare Other | Admitting: Internal Medicine

## 2019-04-04 ENCOUNTER — Encounter: Payer: Self-pay | Admitting: Internal Medicine

## 2019-04-04 DIAGNOSIS — I6389 Other cerebral infarction: Secondary | ICD-10-CM

## 2019-04-04 DIAGNOSIS — I10 Essential (primary) hypertension: Secondary | ICD-10-CM

## 2019-04-04 DIAGNOSIS — J410 Simple chronic bronchitis: Secondary | ICD-10-CM

## 2019-04-04 NOTE — Progress Notes (Signed)
Location:  [place] Room Number: 112-W Place of Service:  SNF (31)  [name] Duos, MD  Patient Care Team: [name] Duos, MD as PCP - General (Internal Medicine) Center, [place] ([place]) [name] [name], Phylis Bougie, NP as Nurse Practitioner (Geriatric Medicine)  Extended Emergency Contact Information Primary Emergency Contact: Malachy Chamber Address: [address]          [place], [place] [address] [name] [name] of [place] Phone: [phone] Mobile Phone: [phone] Relation: Brother    Allergies: Penicillins  Chief Complaint  Patient presents with  . Medical Management of Chronic Issues    Routine [place] visit    HPI: Patient is a 73 y.o. male who is being seen for routine issues of hypertension, history of CVA, and COPD.  Past Medical History:  Diagnosis Date  . Dysphasia   . Fatty liver   . Hemiplegia ([place])   . Hypertension   . Pneumonia   . Pulmonary embolism ([place])   . 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([place])   . Stroke [facility])     Past Surgical History:  Procedure Laterality Date  . FLEXIBLE SIGMOIDOSCOPY N/A 10/21/2016   Procedure: FLEXIBLE SIGMOIDOSCOPY;  Surgeon: Rogene Houston, MD;  [facility];  Service: Endoscopy;  Laterality: N/A;  . FLEXIBLE SIGMOIDOSCOPY N/A 10/25/2016   Procedure: FLEXIBLE SIGMOIDOSCOPY;  Surgeon: Rogene Houston, MD;  Location[facility];  Service: Endoscopy;  Laterality: N/A;  . unable      Allergies as of 04/04/2019      Reactions   Penicillins    Has patient had a PCN reaction causing immediate rash, facial/tongue/throat swelling, SOB or lightheadedness with hypotension: unknown Has patient had a PCN reaction causing severe rash involving mucus membranes or skin necrosis: unknown Has patient had a PCN reaction that required hospitalization: unknown Has patient had a PCN reaction occurring within the last 10 years: unknown If all of the above answers are "NO", then may  proceed with Cephalosporin use. 5/3 Tolerated Cefepime x 1 dose in ED.      Medication List    Notice   This visit is during an admission. Changes to the med list made in this visit will be reflected in the After Visit Summary of the admission.    Current Outpatient Medications on File Prior to Visit  Medication Sig Dispense Refill  . acetaminophen (TYLENOL) 325 MG tablet Take 650 mg by mouth every 6 (six) hours as needed for moderate pain.     Marland Kitchen apixaban (ELIQUIS) 5 MG TABS tablet Take 5 mg by mouth 2 (two) times daily.    Marland Kitchen atorvastatin (LIPITOR) 10 MG tablet Take 10 mg by mouth daily.    . budesonide-formoterol (SYMBICORT) 80-4.5 MCG/ACT inhaler Inhale 2 puffs into the lungs 2 (two) times daily.    . carboxymethylcellulose (REFRESH PLUS) 0.5 % SOLN Place 1 drop into the right eye 3 (three) times daily.     Marland Kitchen erythromycin ophthalmic ointment Place into the left eye. appy 1 ribbon ophthalmic (eye) 3 times a day    . hydrochlorothiazide (HYDRODIURIL) 12.5 MG tablet Take 12.5 mg by mouth daily.    Marland Kitchen ipratropium-albuterol (DUONEB) 0.5-2.5 (3) MG/3ML SOLN Take 3 mLs by nebulization every 6 (six) hours as needed.    . labetalol (NORMODYNE) 200 MG tablet Take 200 mg by mouth daily. For HTN    . loratadine (CLARITIN) 10 MG tablet Take 10 mg by mouth daily.    . meclizine (  ANTIVERT) 12.5 MG tablet Take 12.5 mg by mouth every 8 (eight) hours as needed for dizziness.    . methimazole (TAPAZOLE) 5 MG tablet Take 5 mg by mouth daily.    . NON FORMULARY Diet Type:  Dysphagia 1 diet with honey thick liquids, NAS    . OXYGEN Inhale 2 L/min into the lungs continuous.    . sennosides-docusate sodium (SENOKOT-S) 8.6-50 MG tablet Take 1 tablet by mouth at bedtime. For constipation    . umeclidinium bromide (INCRUSE ELLIPTA) 62.5 MCG/INH AEPB Inhale 1 puff into the lungs daily.    . valsartan (DIOVAN) 80 MG tablet Take 80 mg by mouth daily.     No current facility-administered medications on file prior to  visit.      No orders of the defined types were placed in this encounter.   Immunization History  Administered Date(s) Administered  . Influenza-Unspecified 03/28/2014, 03/23/2016, 03/25/2017, 03/23/2018  . Pneumococcal Conjugate-13 04/21/2015  . Pneumococcal-Unspecified 11/01/2009, 03/30/2016  . Tdap 03/17/2017    Social History   Tobacco Use  . Smoking status: Never Smoker  . Smokeless tobacco: Never Used  Substance Use Topics  . Alcohol use: No    Alcohol/week: 0.0 standard drinks    Review of Systems  DATA OBTAINED: from patient GENERAL:  no fevers, fatigue, appetite changes SKIN: No itching, rash HEENT: No complaint RESPIRATORY: No cough, wheezing, SOB CARDIAC: No chest pain, palpitations, lower extremity edema  GI: No abdominal pain, No N/V/D or constipation, No heartburn or reflux  GU: No dysuria, frequency or urgency, or incontinence  MUSCULOSKELETAL: No unrelieved bone/joint pain NEUROLOGIC: No headache, dizziness  PSYCHIATRIC: No overt anxiety or sadness  Vitals:   04/04/19 1558  BP: 103/69  Pulse: 70  Resp: 20  Temp: 98.2 F (36.8 C)  SpO2: 91%   Body mass index is 35.88 kg/m. Physical Exam  GENERAL APPEARANCE: Alert, conversant, No acute distress  SKIN: No diaphoresis rash HEENT: Unremarkable RESPIRATORY: Breathing is even, unlabored. Lung sounds are slight rhonchi CARDIOVASCULAR: Heart RRR no murmurs, rubs or gallops. No peripheral edema  GASTROINTESTINAL: Abdomen is soft, non-tender, not distended w/ normal bowel sounds.  GENITOURINARY: Bladder non tender, not distended  MUSCULOSKELETAL: No abnormal joints or musculature NEUROLOGIC: Cranial nerves 2-12 grossly intact. Moves all extremities with right-sided weakness PSYCHIATRIC: Mood and affect appropriate to situation, no behavioral issues  Patient Active Problem List   Diagnosis Date Noted  . Elevated PSA, between 10 and less than 20 ng/ml 02/08/2019  . Aspiration pneumonia of right  lower lobe (HCC) 01/30/2019  . 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 Dyslipidemia 08/25/2018  . Hyperthyroidism 04/05/2017  . Hypertension 10/27/2016  . 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 COPD (chronic obstructive pulmonary disease) (HCC) 10/19/2016  . Stroke/cerebrovascular accident (HCC) 03/20/2014  . Long term current use of anticoagulant therapy 10/21/2012  . DVT (deep venous thrombosis) (HCC) 09/26/2012  . Chronic pulmonary embolism (HCC) 09/26/2012  . Aspiration pneumonia of left lower lobe (HCC) 09/26/2012  . Constipation 09/26/2012    CMP     Component Value Date/Time   NA 139 02/05/2019 0620   NA 142 09/12/2015   K 4.0 02/05/2019 0620   CL 101 02/05/2019 0620   CO2 26 02/05/2019 0620   GLUCOSE 99 02/05/2019 0620   BUN 46 (H) 02/05/2019 0620   BUN 18 09/12/2015  CREATININE 1.54 (H) 02/05/2019 0620   CALCIUM 8.7 (L) 02/05/2019 0620   PROT 7.2 02/05/2019 0620   ALBUMIN 3.2 (L) 02/05/2019 0620   AST 17 02/05/2019 0620   ALT 17 02/05/2019 0620   ALKPHOS 50 02/05/2019 0620   BILITOT 0.4 02/05/2019 0620   GFRNONAA 44 (L) 02/05/2019 0620   GFRAA 51 (L) 02/05/2019 0620   Recent Labs    06/02/18 0700 10/26/18 0748 02/05/19 0620  NA 137 141 139  K 4.0 3.9 4.0  CL 101 103 101  CO2 29 28 26   GLUCOSE 88 74 99  BUN 25* 25* 46*  CREATININE 1.17 1.05 1.54*  CALCIUM 8.7* 8.9 8.7*   Recent Labs    05/04/18 0700 02/05/19 0620  AST 24 17  ALT 33 17  ALKPHOS 57 50  BILITOT 0.8 0.4  PROT 7.3 7.2  ALBUMIN 3.4* 3.2*   Recent Labs    04/20/18 0709 10/26/18 0748 02/05/19 0620  WBC 6.7 8.8 9.9  NEUTROABS 3.8 5.4  --   HGB 16.0 15.7 15.3  HCT 49.7 49.2 48.2  MCV 96.9 95.7 96.4  PLT 246 240 236   Recent Labs    05/04/18 0700 02/05/19 0620  CHOL 107 106  LDLCALC 54 62  TRIG 50 54    No results found for: [facility] Lab Results  Component Value Date   TSH 2.668 03/16/2019   Lab Results  Component Value Date   HGBA1C 6.0 04/26/2009   Lab Results  Component Value Date   CHOL 106 02/05/2019   HDL 33 (L) 02/05/2019   LDLCALC 62 02/05/2019   TRIG 54 02/05/2019   CHOLHDL 3.2 02/05/2019    Significant Diagnostic Results in last 30 days:  No results found.  Assessment and Plan  Hypertension Controlled; continue valsartan 80 mg daily labetalol 200 mg daily and hydrochlorothiazide 12.5 mg daily  Stroke/cerebrovascular accident Chronic and stable with residual right-sided weakness; patient is on Eliquis, continue Eliquis twice daily  COPD (chronic obstructive pulmonary disease) (HCC) Chronic and stable; continue O2 2 L nasal cannula, Incruse Ellipta 1 puff daily, Symbicort 2 puffs twice daily and as needed DuoNeb    02/07/2019, MD

## 2019-04-08 ENCOUNTER — Encounter: Payer: Self-pay | Admitting: Internal Medicine

## 2019-04-08 NOTE — Assessment & Plan Note (Signed)
Chronic and stable; continue O2 2 L nasal cannula, Incruse Ellipta 1 puff daily, Symbicort 2 puffs twice daily and as needed DuoNeb

## 2019-04-08 NOTE — Assessment & Plan Note (Signed)
Controlled; continue valsartan 80 mg daily labetalol 200 mg daily and hydrochlorothiazide 12.5 mg daily

## 2019-04-08 NOTE — Assessment & Plan Note (Signed)
Chronic and stable with residual right-sided weakness; patient is on Eliquis, continue Eliquis twice daily

## 2019-04-10 ENCOUNTER — Ambulatory Visit (INDEPENDENT_AMBULATORY_CARE_PROVIDER_SITE_OTHER): Payer: Medicare Other | Admitting: Urology

## 2019-04-10 ENCOUNTER — Other Ambulatory Visit: Payer: Self-pay | Admitting: Adult Health

## 2019-04-10 DIAGNOSIS — R972 Elevated prostate specific antigen [PSA]: Secondary | ICD-10-CM

## 2019-04-11 ENCOUNTER — Other Ambulatory Visit: Payer: Self-pay | Admitting: Adult Health

## 2019-04-13 DIAGNOSIS — I739 Peripheral vascular disease, unspecified: Secondary | ICD-10-CM | POA: Diagnosis not present

## 2019-04-13 DIAGNOSIS — M2042 Other hammer toe(s) (acquired), left foot: Secondary | ICD-10-CM | POA: Diagnosis not present

## 2019-04-13 DIAGNOSIS — B351 Tinea unguium: Secondary | ICD-10-CM | POA: Diagnosis not present

## 2019-04-13 DIAGNOSIS — M2041 Other hammer toe(s) (acquired), right foot: Secondary | ICD-10-CM | POA: Diagnosis not present

## 2019-04-13 LAB — HM DIABETES FOOT EXAM

## 2019-04-30 ENCOUNTER — Non-Acute Institutional Stay (SKILLED_NURSING_FACILITY): Payer: Medicare Other | Admitting: Adult Health

## 2019-04-30 ENCOUNTER — Encounter: Payer: Self-pay | Admitting: Adult Health

## 2019-04-30 DIAGNOSIS — Z7951 Long term (current) use of inhaled steroids: Secondary | ICD-10-CM | POA: Diagnosis not present

## 2019-04-30 DIAGNOSIS — H04123 Dry eye syndrome of bilateral lacrimal glands: Secondary | ICD-10-CM | POA: Diagnosis not present

## 2019-04-30 DIAGNOSIS — J189 Pneumonia, unspecified organism: Secondary | ICD-10-CM | POA: Diagnosis not present

## 2019-04-30 DIAGNOSIS — Z7901 Long term (current) use of anticoagulants: Secondary | ICD-10-CM | POA: Diagnosis not present

## 2019-04-30 DIAGNOSIS — H16212 Exposure keratoconjunctivitis, left eye: Secondary | ICD-10-CM | POA: Diagnosis not present

## 2019-04-30 LAB — HM DIABETES EYE EXAM

## 2019-04-30 NOTE — Progress Notes (Signed)
Location:    [facility] Room Number: 112/W Place of Service:  SNF (31)   CODE STATUS: Full Code  Allergies  Allergen Reactions  . Penicillins     Has patient had a PCN reaction causing immediate rash, facial/tongue/throat swelling, SOB or lightheadedness with hypotension: unknown Has patient had a PCN reaction causing severe rash involving mucus membranes or skin necrosis: unknown Has patient had a PCN reaction that required hospitalization: unknown Has patient had a PCN reaction occurring within the last 10 years: unknown If all of the above answers are "NO", then may proceed with Cephalosporin use. 5/3 Tolerated Cefepime x 1 dose in ED.     Chief Complaint  Patient presents with  . Acute Visit    Low O2 Stats    HPI:  Over this past weekend he had 02 sats in the upper 80's. He was placed on 02 with relief of his symptoms. Today he has a worsening cough; with increased congestion and is more hoarse. There are no reports of fevers present.   Past Medical History:  Diagnosis Date  . Dysphasia   . Fatty liver   . Hemiplegia (HCC)   . Hypertension   . Pneumonia   . Pulmonary embolism (HCC)   . 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(HCC)   . Stroke [facility])     Past Surgical History:  Procedure Laterality Date  . FLEXIBLE SIGMOIDOSCOPY N/A 10/21/2016   Procedure: FLEXIBLE SIGMOIDOSCOPY;  Surgeon: Malissa Hippo, MD;  [facility];  Service: Endoscopy;  Laterality: N/A;  . FLEXIBLE SIGMOIDOSCOPY N/A 10/25/2016   Procedure: FLEXIBLE SIGMOIDOSCOPY;  Surgeon: Malissa Hippo, MD;  [facility];  Service: Endoscopy;  Laterality: N/A;  . unable      Social History   Socioeconomic History  . Marital status: Single    Spouse name: Not on file  . Number of children: Not on file  . Years of education: Not on file  . Highest education level: Not on file  Occupational History  . Not on file  Social Needs  . Financial resource strain: Not hard at all   . Food insecurity    Worry: Never true    Inability: Never true  . Transportation needs    Medical: No    Non-medical: No  Tobacco Use  . Smoking status: Never Smoker  . Smokeless tobacco: Never Used  Substance and Sexual Activity  . Alcohol use: No    Alcohol/week: 0.0 standard drinks  . Drug use: No  . Sexual activity: Not on file  Lifestyle  . Physical activity    Days per week: 0 days    Minutes per session: 0 min  . Stress: Not at all  Relationships  . Social Musician on phone: Never    Gets together: Once a week    Attends religious service: Never    Active member of club or organization: No    Attends meetings of clubs or organizations: Never    Relationship status: Never married  . Intimate partner violence    Fear of current or ex partner: No    Emotionally abused: No    Physically abused: No    Forced sexual activity: No  Other Topics Concern  . Not on file  Social History Narrative  . Not on file   No family history on file.    VITAL SIGNS BP (!) 146/92   Pulse 80   Temp 98.1 F (36.7  C) (Oral)   Resp 20   Ht 5' 8.5" (1.74 m)   Wt 234 lb 9.6 oz (106.4 kg)   SpO2 91%   BMI 35.15 kg/m   Outpatient Encounter Medications as of 04/30/2019  Medication Sig  . acetaminophen (TYLENOL) 325 MG tablet Take 650 mg by mouth every 6 (six) hours as needed for moderate pain.   Marland Kitchen. apixaban (ELIQUIS) 5 MG TABS tablet Take 5 mg by mouth 2 (two) times daily.  Marland Kitchen. atorvastatin (LIPITOR) 10 MG tablet Take 10 mg by mouth daily.  . budesonide-formoterol (SYMBICORT) 80-4.5 MCG/ACT inhaler Inhale 2 puffs into the lungs 2 (two) times daily.  . carboxymethylcellulose (REFRESH PLUS) 0.5 % SOLN Place 1 drop into the right eye 3 (three) times daily.   Marland Kitchen. doxycycline (DORYX) 100 MG EC tablet Take 100 mg by mouth 2 (two) times daily.  Marland Kitchen. erythromycin ophthalmic ointment Place into the left eye. appy 1 ribbon ophthalmic (eye) 3 times a day  . hydrochlorothiazide  (HYDRODIURIL) 12.5 MG tablet Take 12.5 mg by mouth daily.  Marland Kitchen. ipratropium-albuterol (DUONEB) 0.5-2.5 (3) MG/3ML SOLN Take 3 mLs by nebulization every 6 (six) hours.   Melene Muller. [START ON 05/15/2019] ipratropium-albuterol (DUONEB) 0.5-2.5 (3) MG/3ML SOLN Take 3 mLs by nebulization every 6 (six) hours as needed.  . labetalol (NORMODYNE) 200 MG tablet Take 200 mg by mouth daily. For HTN  . loratadine (CLARITIN) 10 MG tablet Take 10 mg by mouth daily.  . meclizine (ANTIVERT) 12.5 MG tablet Take 12.5 mg by mouth every 8 (eight) hours as needed for dizziness.  . methimazole (TAPAZOLE) 5 MG tablet Take 5 mg by mouth daily.  . NON FORMULARY Diet Type:  Dysphagia 1 diet with honey thick liquids, NAS  . OXYGEN Inhale 2 L/min into the lungs continuous.  . sennosides-docusate sodium (SENOKOT-S) 8.6-50 MG tablet Take 1 tablet by mouth at bedtime. For constipation  . umeclidinium bromide (INCRUSE ELLIPTA) 62.5 MCG/INH AEPB Inhale 1 puff into the lungs daily.  . valsartan (DIOVAN) 80 MG tablet Take 80 mg by mouth daily.   No facility-administered encounter medications on file as of 04/30/2019.      SIGNIFICANT DIAGNOSTIC EXAMS   PREVIOUS;   01-16-19: chest x-ray: Infiltrate versus atelectasis at LEFT base   NO NEW EXAMS.    LABS REVIEWED: PREVIOUS   04-24-18: tsh 3.326 free T 4: 0.84 05-04-18: glucose 85; bun 31; creat 1.08 k+ 4.0;  na++ 138 ca 8.6; liver normal albumin 3.4 chol 107; ldl 54; trig 50; hdl 43  06-02-18: glucose 88; bun 25; creat 1.17; k+ 4.0; na++ 137; ca 8.7  07-28-18: tsh 0.299; free T3: 2.8; free T4: 0.92 08-17-18: INR 2.5  09-14-18: INR 2.7 10-05-18: INR 3.3 10-12-18: INR 1.9  10-19-18: INR 2.9 10-26-18: wbc 8.8; hgb 15.7; hct 49.2; mcv 95.7; plt 240; glucose 74; bun 25; creat 1.05; k+ 3.9; na++ 141; ca 8.9 INR 2.1  11-27-18: INR 2.7 tsh 0.051 free T3: 3.2; free T4: 1.16 12-11-18: INR 3.3 12-13-18: INR 2.7 12-20-18: INR 2.4 tsh 0.444 free T3: 2.4 free T4: 0.90 01-02-19: INR 2.9  01-09-19: INR 3.5  01-16-19: INR 4.4 01-18-19: INR 3.3  01-23-19: INR 1.4  02-05-19: wbc 9.9; hgb 15.3; hct 482; mcv 96.4; plt 236; glucose 99; bun 46; creat 1.54; k+ 4.0; na++ 139; ca 8.7; liver normal albumin 3.2; chol 106; ldl 62; trig 54; hdl 33; PSA 10.58 (scheduled for 03-31-19)   TODAY;   03-16-19: tsh 2.668 free T4: 0.81 05-01-19: PSA 11.82  Review of Systems  Constitutional: Negative for malaise/fatigue.  HENT: Positive for congestion.   Respiratory: Positive for cough, shortness of breath and wheezing.   Cardiovascular: Negative for chest pain, palpitations and leg swelling.  Gastrointestinal: Negative for abdominal pain, constipation and heartburn.  Musculoskeletal: Negative for back pain, joint pain and myalgias.  Skin: Negative.   Neurological: Negative for dizziness.  Psychiatric/Behavioral: The patient is not nervous/anxious.    Physical Exam Constitutional:      General: He is not in acute distress.    Appearance: He is well-developed. He is obese. He is not diaphoretic.  Neck:     Musculoskeletal: Neck supple.     Thyroid: No thyromegaly.  Cardiovascular:     Rate and Rhythm: Normal rate and regular rhythm.     Pulses: Normal pulses.     Heart sounds: Normal heart sounds.  Pulmonary:     Effort: Pulmonary effort is normal. No respiratory distress.     Breath sounds: Wheezing, rhonchi and rales present.  Abdominal:     General: Bowel sounds are normal. There is no distension.     Palpations: Abdomen is soft.     Tenderness: There is no abdominal tenderness.  Musculoskeletal:     Right lower leg: No edema.     Left lower leg: No edema.     Comments:   Right hemiplegia Is able to move left extremities      Lymphadenopathy:     Cervical: No cervical adenopathy.  Skin:    General: Skin is warm and dry.     Comments: Bilateral lower extremities discolored     Neurological:     Mental Status: He is alert. Mental status is at baseline.  Psychiatric:        Mood and Affect: Mood  normal.      ASSESSMENT/ PLAN:  TODAY;   1. HCAP: probably aspiration: will begin doxycycline 100 mg twice daily through 05-14-19 and duoneb every 6 hour through 05-14-19 then every 6 hours as needed. Will monitor his status.      MD is aware of resident's narcotic use and is in agreement with current plan of care. We will attempt to wean resident as appropriate.  Ok Edwards NP [facility] Adult [facility]  Contact [phone] Monday through Friday 8am- 5pm  After hours call [phone]

## 2019-05-01 ENCOUNTER — Encounter (HOSPITAL_COMMUNITY)
Admission: RE | Admit: 2019-05-01 | Discharge: 2019-05-01 | Disposition: A | Discharge status: Home / Self Care | Payer: Medicare Other | Source: Skilled Nursing Facility | Attending: Internal Medicine | Admitting: Internal Medicine

## 2019-05-01 DIAGNOSIS — N179 Acute kidney failure, unspecified: Secondary | ICD-10-CM | POA: Insufficient documentation

## 2019-05-01 LAB — PSA: Prostatic Specific Antigen: 11.82 ng/mL — ABNORMAL HIGH (ref 0.00–4.00)

## 2019-05-04 DIAGNOSIS — J189 Pneumonia, unspecified organism: Secondary | ICD-10-CM | POA: Insufficient documentation

## 2019-05-05 ENCOUNTER — Encounter (HOSPITAL_COMMUNITY)
Admission: AD | Admit: 2019-05-05 | Discharge: 2019-05-05 | Disposition: A | Discharge status: Home / Self Care | Payer: Medicare Other | Source: Skilled Nursing Facility | Attending: Adult Health | Admitting: Adult Health

## 2019-05-05 DIAGNOSIS — N179 Acute kidney failure, unspecified: Secondary | ICD-10-CM | POA: Diagnosis not present

## 2019-05-05 DIAGNOSIS — I10 Essential (primary) hypertension: Secondary | ICD-10-CM | POA: Diagnosis not present

## 2019-05-05 LAB — CBC WITH DIFFERENTIAL/PLATELET
Abs Immature Granulocytes: 0.1 10*3/uL — ABNORMAL HIGH (ref 0.00–0.07)
Basophils Absolute: 0 10*3/uL (ref 0.0–0.1)
Basophils Relative: 0 %
Eosinophils Absolute: 0 10*3/uL (ref 0.0–0.5)
Eosinophils Relative: 0 %
HCT: 53.4 % — ABNORMAL HIGH (ref 39.0–52.0)
Hemoglobin: 16.7 g/dL (ref 13.0–17.0)
Immature Granulocytes: 1 %
Lymphocytes Relative: 11 %
Lymphs Abs: 1.3 10*3/uL (ref 0.7–4.0)
MCH: 30.7 pg (ref 26.0–34.0)
MCHC: 31.3 g/dL (ref 30.0–36.0)
MCV: 98.2 fL (ref 80.0–100.0)
Monocytes Absolute: 1 10*3/uL (ref 0.1–1.0)
Monocytes Relative: 8 %
Neutro Abs: 9.8 10*3/uL — ABNORMAL HIGH (ref 1.7–7.7)
Neutrophils Relative %: 80 %
Platelets: 246 10*3/uL (ref 150–400)
RBC: 5.44 MIL/uL (ref 4.22–5.81)
RDW: 13.7 % (ref 11.5–15.5)
WBC: 12.3 10*3/uL — ABNORMAL HIGH (ref 4.0–10.5)
nRBC: 0 % (ref 0.0–0.2)

## 2019-05-07 ENCOUNTER — Encounter: Payer: Self-pay | Admitting: Adult Health

## 2019-05-07 ENCOUNTER — Non-Acute Institutional Stay (SKILLED_NURSING_FACILITY): Payer: Medicare Other | Admitting: Adult Health

## 2019-05-07 ENCOUNTER — Telehealth: Payer: Self-pay | Admitting: Nurse Practitioner

## 2019-05-07 DIAGNOSIS — J189 Pneumonia, unspecified organism: Secondary | ICD-10-CM

## 2019-05-07 NOTE — Progress Notes (Signed)
Location:  [facility] Room Number: 112-W Place of Service:  SNF (31)   CODE STATUS:  FULL CODE  Allergies  Allergen Reactions  . Penicillins     Has patient had a PCN reaction causing immediate rash, facial/tongue/throat swelling, SOB or lightheadedness with hypotension: unknown Has patient had a PCN reaction causing severe rash involving mucus membranes or skin necrosis: unknown Has patient had a PCN reaction that required hospitalization: unknown Has patient had a PCN reaction occurring within the last 10 years: unknown If all of the above answers are "NO", then may proceed with Cephalosporin use. 5/3 Tolerated Cefepime x 1 dose in ED.     Chief Complaint  Patient presents with  . Acute Visit    Patient is seen to follow up fever.    HPI:  He has ran a fever over the past weekend. He has been treated with doxycyline for aspiration pneumonia. His x-ray has been negative. He does have a cough which is not productive. 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    Past Medical History:  Diagnosis Date  . Dysphasia   . Fatty liver   . Hemiplegia (HCC)   . Hypertension   . Pneumonia   . Pulmonary embolism (HCC)   . 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(HCC)   . Stroke [facility](HCC)     Past Surgical History:  Procedure Laterality Date  . FLEXIBLE SIGMOIDOSCOPY N/A 10/21/2016   Procedure: FLEXIBLE SIGMOIDOSCOPY;  Surgeon: Malissa Hippoehman, Najeeb U, MD;  [facility]: [facility];  Service: Endoscopy;  Laterality: N/A;  . FLEXIBLE SIGMOIDOSCOPY N/A 10/25/2016   Procedure: FLEXIBLE SIGMOIDOSCOPY;  Surgeon: Malissa Hippoehman, Najeeb U, MD;  Location: [facility];  Service: Endoscopy;  Laterality: N/A;  . unable      Social History   Socioeconomic History  . Marital status: Single    Spouse name: Not on file  . Number of children: Not on file  . Years of education: Not on file  . Highest education level: Not on file  Occupational History  . Not on file  Social Needs   . Financial resource strain: Not hard at all  . Food insecurity    Worry: Never true    Inability: Never true  . Transportation needs    Medical: No    Non-medical: No  Tobacco Use  . Smoking status: Never Smoker  . Smokeless tobacco: Never Used  Substance and Sexual Activity  . Alcohol use: No    Alcohol/week: 0.0 standard drinks  . Drug use: No  . Sexual activity: Not on file  Lifestyle  . Physical activity    Days per week: 0 days    Minutes per session: 0 min  . Stress: Not at all  Relationships  . Social Musicianconnections    Talks on phone: Never    Gets together: Once a week    Attends religious service: Never    Active member of club or organization: No    Attends meetings of clubs or organizations: Never    Relationship status: Never married  . Intimate partner violence    Fear of current or ex partner: No    Emotionally abused: No    Physically abused: No    Forced sexual activity: No  Other Topics Concern  . Not on file  Social History Narrative  . Not on file   No family history on file.    VITAL SIGNS BP 125/84   Pulse 62  Temp 98.6 F (37 C) (Oral)   Resp 16   Ht 5' 8.5" (1.74 m)   Wt 234 lb 9.6 oz (106.4 kg)   SpO2 91%   BMI 35.15 kg/m   Outpatient Encounter Medications as of 05/07/2019  Medication Sig  . acetaminophen (TYLENOL) 325 MG tablet Take 650 mg by mouth every 6 (six) hours as needed for moderate pain.   Marland Kitchen apixaban (ELIQUIS) 5 MG TABS tablet Take 5 mg by mouth 2 (two) times daily.  Marland Kitchen atorvastatin (LIPITOR) 10 MG tablet Take 10 mg by mouth daily.  . budesonide-formoterol (SYMBICORT) 80-4.5 MCG/ACT inhaler Inhale 2 puffs into the lungs 2 (two) times daily.  . carboxymethylcellulose (REFRESH PLUS) 0.5 % SOLN Place 1 drop into the right eye 3 (three) times daily.   Marland Kitchen doxycycline (DORYX) 100 MG EC tablet Take 100 mg by mouth 2 (two) times daily.  Marland Kitchen erythromycin ophthalmic ointment Place into the left eye. appy 1 ribbon ophthalmic (eye) 3  times a day  . hydrochlorothiazide (HYDRODIURIL) 12.5 MG tablet Take 12.5 mg by mouth daily.  Marland Kitchen ipratropium-albuterol (DUONEB) 0.5-2.5 (3) MG/3ML SOLN Take 3 mLs by nebulization every 6 (six) hours.   Derrill Memo ON 05/15/2019] ipratropium-albuterol (DUONEB) 0.5-2.5 (3) MG/3ML SOLN Take 3 mLs by nebulization every 6 (six) hours as needed.  . labetalol (NORMODYNE) 200 MG tablet Take 200 mg by mouth daily. For HTN  . loratadine (CLARITIN) 10 MG tablet Take 10 mg by mouth daily.  . meclizine (ANTIVERT) 12.5 MG tablet Take 12.5 mg by mouth every 8 (eight) hours as needed for dizziness.  . methimazole (TAPAZOLE) 5 MG tablet Take 5 mg by mouth daily.  . NON FORMULARY Diet Type:  Dysphagia 1 diet with honey thick liquids, NAS  . OXYGEN Inhale 2 L/min into the lungs continuous.  . sennosides-docusate sodium (SENOKOT-S) 8.6-50 MG tablet Take 1 tablet by mouth at bedtime. For constipation  . umeclidinium bromide (INCRUSE ELLIPTA) 62.5 MCG/INH AEPB Inhale 1 puff into the lungs daily.  . valsartan (DIOVAN) 80 MG tablet Take 80 mg by mouth daily.   No facility-administered encounter medications on file as of 05/07/2019.      SIGNIFICANT DIAGNOSTIC EXAMS   PREVIOUS;   01-16-19: chest x-ray: Infiltrate versus atelectasis at LEFT base   TODAY;   05-05-19: chest x-ray: 1. Normal chest x-ray.  2. No tuberculosis is noted    LABS REVIEWED: PREVIOUS   04-24-18: tsh 3.326 free T 4: 0.84 05-04-18: glucose 85; bun 31; creat 1.08 k+ 4.0;  na++ 138 ca 8.6; liver normal albumin 3.4 chol 107; ldl 54; trig 50; hdl 43  06-02-18: glucose 88; bun 25; creat 1.17; k+ 4.0; na++ 137; ca 8.7  07-28-18: tsh 0.299; free T3: 2.8; free T4: 0.92 08-17-18: INR 2.5  09-14-18: INR 2.7 10-05-18: INR 3.3 10-12-18: INR 1.9  10-19-18: INR 2.9 10-26-18: wbc 8.8; hgb 15.7; hct 49.2; mcv 95.7; plt 240; glucose 74; bun 25; creat 1.05; k+ 3.9; na++ 141; ca 8.9 INR 2.1  11-27-18: INR 2.7 tsh 0.051 free T3: 3.2; free T4: 1.16 12-11-18: INR  3.3 12-13-18: INR 2.7 12-20-18: INR 2.4 tsh 0.444 free T3: 2.4 free T4: 0.90 01-02-19: INR 2.9  01-09-19: INR 3.5 01-16-19: INR 4.4 01-18-19: INR 3.3  01-23-19: INR 1.4  02-05-19: wbc 9.9; hgb 15.3; hct 482; mcv 96.4; plt 236; glucose 99; bun 46; creat 1.54; k+ 4.0; na++ 139; ca 8.7; liver normal albumin 3.2; chol 106; ldl 62; trig 54; hdl 33; PSA 10.58 (  scheduled for 03-31-19)  03-16-19: tsh 2.668 free T4: 0.81 05-01-19: PSA 11.82  NO NEW LABS.   Review of Systems  Constitutional: Negative for malaise/fatigue.  HENT: Positive for congestion.   Respiratory: Positive for cough and shortness of breath.   Cardiovascular: Negative for chest pain, palpitations and leg swelling.  Gastrointestinal: Negative for abdominal pain, constipation and heartburn.  Musculoskeletal: Negative for back pain, joint pain and myalgias.  Skin: Negative.   Neurological: Negative for dizziness.  Psychiatric/Behavioral: The patient is not nervous/anxious.       Physical Exam Constitutional:      General: He is not in acute distress.    Appearance: He is well-developed. He is obese. He is not diaphoretic.  Neck:     Musculoskeletal: Neck supple.     Thyroid: No thyromegaly.  Cardiovascular:     Rate and Rhythm: Normal rate and regular rhythm.     Pulses: Normal pulses.     Heart sounds: Normal heart sounds.  Pulmonary:     Effort: Pulmonary effort is normal. No respiratory distress.     Breath sounds: Wheezing, rhonchi and rales present.  Abdominal:     General: Bowel sounds are normal. There is no distension.     Palpations: Abdomen is soft.     Tenderness: There is no abdominal tenderness.  Musculoskeletal:     Right lower leg: No edema.     Left lower leg: No edema.     Comments: Right hemiplegia Is able to move left extremities       Lymphadenopathy:     Cervical: No cervical adenopathy.  Skin:    General: Skin is warm and dry.  Neurological:     Mental Status: He is alert. Mental status is at  baseline.  Psychiatric:        Mood and Affect: Mood normal.      ASSESSMENT/ PLAN:  1. Aspiration pneumonia: is without change in status; has ran fever; will stop doxycyline and will begin zithromax 500 mg daily through 05-14-19 and will monitor his status.   MD is aware of resident's narcotic use and is in agreement with current plan of care. We will attempt to wean resident as appropriate.  Synthia Innocent NP [facility]  Contact [phone] Monday through Friday 8am- 5pm  After hours call [phone]

## 2019-05-07 NOTE — Telephone Encounter (Signed)
Late entry from on call on 05/04/2019. Called due to temp of 100.2. otherwise VSS. pt had been placed on doxycycline due to HCAP on 04/30/2019. COVID negative. Due to ongoing low grade temp repeat Chest xray ordered, Blood culture x2, and cbc with diff to be obtain. Will fwd to Bard Herbert, NP for follow up on.

## 2019-05-08 ENCOUNTER — Non-Acute Institutional Stay (SKILLED_NURSING_FACILITY): Payer: Medicare Other | Admitting: Adult Health

## 2019-05-08 ENCOUNTER — Encounter (HOSPITAL_COMMUNITY)
Admission: RE | Admit: 2019-05-08 | Discharge: 2019-05-08 | Disposition: A | Discharge status: Home / Self Care | Payer: Medicare Other | Source: Skilled Nursing Facility

## 2019-05-08 ENCOUNTER — Encounter: Payer: Self-pay | Admitting: Adult Health

## 2019-05-08 DIAGNOSIS — J189 Pneumonia, unspecified organism: Secondary | ICD-10-CM | POA: Diagnosis not present

## 2019-05-08 DIAGNOSIS — R1900 Intra-abdominal and pelvic swelling, mass and lump, unspecified site: Secondary | ICD-10-CM | POA: Diagnosis not present

## 2019-05-08 DIAGNOSIS — Z9911 Dependence on respirator [ventilator] status: Secondary | ICD-10-CM | POA: Diagnosis not present

## 2019-05-08 DIAGNOSIS — J410 Simple chronic bronchitis: Secondary | ICD-10-CM | POA: Diagnosis not present

## 2019-05-08 DIAGNOSIS — I10 Essential (primary) hypertension: Secondary | ICD-10-CM | POA: Diagnosis not present

## 2019-05-08 DIAGNOSIS — K562 Volvulus: Secondary | ICD-10-CM | POA: Diagnosis not present

## 2019-05-08 DIAGNOSIS — Z20828 Contact with and (suspected) exposure to other viral communicable diseases: Secondary | ICD-10-CM | POA: Diagnosis not present

## 2019-05-08 DIAGNOSIS — R0602 Shortness of breath: Secondary | ICD-10-CM | POA: Diagnosis not present

## 2019-05-08 DIAGNOSIS — J690 Pneumonitis due to inhalation of food and vomit: Secondary | ICD-10-CM | POA: Diagnosis not present

## 2019-05-08 DIAGNOSIS — J9601 Acute respiratory failure with hypoxia: Secondary | ICD-10-CM | POA: Diagnosis not present

## 2019-05-08 DIAGNOSIS — R739 Hyperglycemia, unspecified: Secondary | ICD-10-CM | POA: Diagnosis not present

## 2019-05-08 DIAGNOSIS — A419 Sepsis, unspecified organism: Secondary | ICD-10-CM | POA: Diagnosis not present

## 2019-05-08 DIAGNOSIS — I69351 Hemiplegia and hemiparesis following cerebral infarction affecting right dominant side: Secondary | ICD-10-CM | POA: Diagnosis not present

## 2019-05-08 DIAGNOSIS — R0603 Acute respiratory distress: Secondary | ICD-10-CM | POA: Diagnosis not present

## 2019-05-08 LAB — PSA: Prostatic Specific Antigen: 16.39 ng/mL — ABNORMAL HIGH (ref 0.00–4.00)

## 2019-05-08 NOTE — Progress Notes (Signed)
Location:    [facility] Room Number: 112/W Place of Service:  SNF (31)   CODE STATUS: Full Code  Allergies  Allergen Reactions   Penicillins     Has patient had a PCN reaction causing immediate rash, facial/tongue/throat swelling, SOB or lightheadedness with hypotension: unknown Has patient had a PCN reaction causing severe rash involving mucus membranes or skin necrosis: unknown Has patient had a PCN reaction that required hospitalization: unknown Has patient had a PCN reaction occurring within the last 10 years: unknown If all of the above answers are "NO", then may proceed with Cephalosporin use. 5/3 Tolerated Cefepime x 1 dose in ED.     Chief Complaint  Patient presents with   Medical Management of Chronic Issues        Sigmoid volvulus:   Simple chronic bronchitis   Essential hypertension     HPI:  He is a 73 year old long term resident of this facility being seen for the management of his chronic illnesses: sigmoid volvulus bronchitis; hypertension.  He is presently being treated for aspiration pneumonia. His abdomen is distended. He pointed to his stomach and stated I cant breath. There are no reports of fevers. He remains on room air.   Past Medical History:  Diagnosis Date   Dysphasia    Fatty liver    Hemiplegia (HCC)    Hypertension    Pneumonia    Pulmonary embolism (HCC)    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(HCC)    Stroke [facility](HCC)     Past Surgical History:  Procedure Laterality Date   FLEXIBLE SIGMOIDOSCOPY N/A 10/21/2016   Procedure: FLEXIBLE SIGMOIDOSCOPY;  Surgeon: Malissa Hippoehman, Najeeb U, MD;  [facility];  Service: Endoscopy;  Laterality: N/A;   FLEXIBLE SIGMOIDOSCOPY N/A 10/25/2016   Procedure: FLEXIBLE SIGMOIDOSCOPY;  Surgeon: Malissa Hippoehman, Najeeb U, MD;  [facility]: [facility];  Service: Endoscopy;  Laterality: N/A;   unable      Social History   Socioeconomic History   Marital status: Single    Spouse name: Not on file    Number of children: Not on file   Years of education: Not on file   Highest education level: Not on file  Occupational History   Not on file  Social Needs   Financial resource strain: Not hard at all   Food insecurity    Worry: Never true    Inability: Never true   Transportation needs    Medical: No    Non-medical: No  Tobacco Use   Smoking status: Never Smoker   Smokeless tobacco: Never Used  Substance and Sexual Activity   Alcohol use: No    Alcohol/week: 0.0 standard drinks   Drug use: No   Sexual activity: Not on file  Lifestyle   Physical activity    Days per week: 0 days    Minutes per session: 0 min   Stress: Not at all  Relationships   Social connections    Talks on phone: Never    Gets together: Once a week    Attends religious service: Never    Active member of club or organization: No    Attends meetings of clubs or organizations: Never    Relationship status: Never married   Intimate partner violence    Fear of current or ex partner: No    Emotionally abused: No    Physically abused: No    Forced sexual activity: No  Other Topics Concern   Not on file  Social History Narrative   Not on file   No family history on file.    VITAL SIGNS BP 125/84    Pulse 62    Temp 98.6 F (37 C) (Oral)    Resp 16    Ht 5' 8.5" (1.74 m)    Wt 234 lb 9.6 oz (106.4 kg)    SpO2 91%    BMI 35.15 kg/m   Outpatient Encounter Medications as of 05/08/2019  Medication Sig   acetaminophen (TYLENOL) 325 MG tablet Take 650 mg by mouth every 6 (six) hours as needed for moderate pain.    apixaban (ELIQUIS) 5 MG TABS tablet Take 5 mg by mouth 2 (two) times daily.   atorvastatin (LIPITOR) 10 MG tablet Take 10 mg by mouth daily.   azithromycin (ZITHROMAX) 500 MG tablet Take 500 mg by mouth daily.   budesonide-formoterol (SYMBICORT) 80-4.5 MCG/ACT inhaler Inhale 2 puffs into the lungs 2 (two) times daily.   carboxymethylcellulose (REFRESH PLUS) 0.5 % SOLN  Place 1 drop into the right eye 3 (three) times daily.    erythromycin ophthalmic ointment Place into the left eye. appy 1 ribbon ophthalmic (eye) 3 times a day   hydrochlorothiazide (HYDRODIURIL) 12.5 MG tablet Take 12.5 mg by mouth daily.   ipratropium-albuterol (DUONEB) 0.5-2.5 (3) MG/3ML SOLN Take 3 mLs by nebulization every 6 (six) hours.    [START ON 05/15/2019] ipratropium-albuterol (DUONEB) 0.5-2.5 (3) MG/3ML SOLN Take 3 mLs by nebulization every 6 (six) hours as needed.   labetalol (NORMODYNE) 200 MG tablet Take 200 mg by mouth daily. For HTN   loratadine (CLARITIN) 10 MG tablet Take 10 mg by mouth daily.   methimazole (TAPAZOLE) 5 MG tablet Take 5 mg by mouth daily.   NON FORMULARY Diet Type:  Dysphagia 1 diet with honey thick liquids, NAS   OXYGEN Inhale 2 L/min into the lungs continuous.   sennosides-docusate sodium (SENOKOT-S) 8.6-50 MG tablet Take 2 tablets by mouth 2 (two) times daily. For constipation   umeclidinium bromide (INCRUSE ELLIPTA) 62.5 MCG/INH AEPB Inhale 1 puff into the lungs daily.   valsartan (DIOVAN) 80 MG tablet Take 80 mg by mouth daily.   No facility-administered encounter medications on file as of 05/08/2019.      SIGNIFICANT DIAGNOSTIC EXAMS   PREVIOUS;   01-16-19: chest x-ray: Infiltrate versus atelectasis at LEFT base   05-05-19: chest x-ray: 1. Normal chest x-ray.  2. No tuberculosis is noted   TODAY;   05-08-19: KUB: Findings suggest constipation, fecal impaction or decreased colonic motility. Correlate with risk factors for constipation or fecal stasis, based on frequency or absence of bowel movements. Overall sensitivity is decreased, due to attenuation from patient body habitus.  Soft tissue attenuation results in significantly diminished diagnostic sensitivity and utility.   LABS REVIEWED: PREVIOUS   04-24-18: tsh 3.326 free T 4: 0.84 05-04-18: glucose 85; bun 31; creat 1.08 k+ 4.0;  na++ 138 ca 8.6; liver normal albumin 3.4  chol 107; ldl 54; trig 50; hdl 43  06-02-18: glucose 88; bun 25; creat 1.17; k+ 4.0; na++ 137; ca 8.7  07-28-18: tsh 0.299; free T3: 2.8; free T4: 0.92 08-17-18: INR 2.5  09-14-18: INR 2.7 10-05-18: INR 3.3 10-12-18: INR 1.9  10-19-18: INR 2.9 10-26-18: wbc 8.8; hgb 15.7; hct 49.2; mcv 95.7; plt 240; glucose 74; bun 25; creat 1.05; k+ 3.9; na++ 141; ca 8.9 INR 2.1  11-27-18: INR 2.7 tsh 0.051 free T3: 3.2; free T4: 1.16 12-11-18: INR 3.3 12-13-18: INR 2.7 12-20-18:  INR 2.4 tsh 0.444 free T3: 2.4 free T4: 0.90 01-02-19: INR 2.9  01-09-19: INR 3.5 01-16-19: INR 4.4 01-18-19: INR 3.3  01-23-19: INR 1.4  02-05-19: wbc 9.9; hgb 15.3; hct 482; mcv 96.4; plt 236; glucose 99; bun 46; creat 1.54; k+ 4.0; na++ 139; ca 8.7; liver normal albumin 3.2; chol 106; ldl 62; trig 54; hdl 33; PSA 10.58 (scheduled for 03-31-19)  03-16-19: tsh 2.668 free T4: 0.81 05-01-19: PSA 11.82  NO NEW LABS.   Review of Systems  Constitutional: Negative for malaise/fatigue.  Respiratory: Positive for cough and shortness of breath.   Cardiovascular: Negative for chest pain, palpitations and leg swelling.  Gastrointestinal: Negative for abdominal pain, constipation and heartburn.  Musculoskeletal: Negative for back pain, joint pain and myalgias.  Skin: Negative.   Neurological: Negative for dizziness.  Psychiatric/Behavioral: The patient is not nervous/anxious.    Physical Exam Constitutional:      General: He is not in acute distress.    Appearance: He is well-developed. He is obese. He is not diaphoretic.  Neck:     Musculoskeletal: Neck supple.     Thyroid: No thyromegaly.  Cardiovascular:     Rate and Rhythm: Regular rhythm. Bradycardia present.     Heart sounds: Normal heart sounds.  Pulmonary:     Effort: Pulmonary effort is normal. No respiratory distress.     Breath sounds: Wheezing and rhonchi present.  Abdominal:     General: There is distension.     Tenderness: There is no abdominal tenderness.     Comments: Is  tympanic; firm with hypoactive bowel sounds.   Musculoskeletal:     Right lower leg: No edema.     Left lower leg: No edema.     Comments:  Right hemiplegia Is able to move left extremities        Lymphadenopathy:     Cervical: No cervical adenopathy.  Skin:    General: Skin is warm and dry.  Neurological:     Mental Status: He is alert. Mental status is at baseline.  Psychiatric:        Mood and Affect: Mood normal.       ASSESSMENT/ PLAN:  TODAY:  1. 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   2. Simple chronic bronchitis is worse: will continue symbicort 80/4,5 mcg 2 puffs twice daily claritin 10 mg daily incurse elipita 62.5 mg daily and duoneb every 6 hours will complete zithromax for pneumonia and will monitor his status.   3. Essential hypertension is stable b/p 125/84 will continue diovan 80 mg daily and hctz 12.5 mg daily    PREVIOUS  4. 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 Dyslipidemia: is stable LDL 54 will continue lipitor 10 mg daily   6. Hyperthyroidism: is stable tsh 0.051 free t3: 3.2 free t4: 1.16 will continue tapazole 5 mg daily   7. 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 We will attempt to wean resident as appropriate.  Synthia Innocent NP [facility]  Contact [phone] Monday through Friday 8am- 5pm  After hours call [phone]

## 2019-05-09 ENCOUNTER — Encounter (HOSPITAL_COMMUNITY): Payer: Self-pay

## 2019-05-09 ENCOUNTER — Other Ambulatory Visit (HOSPITAL_COMMUNITY)
Admission: RE | Admit: 2019-05-09 | Discharge: 2019-05-09 | Disposition: A | Discharge status: Home / Self Care | Payer: Medicare Other | Source: Skilled Nursing Facility | Attending: Adult Health | Admitting: Adult Health

## 2019-05-09 ENCOUNTER — Emergency Department (HOSPITAL_COMMUNITY): Payer: Medicare Other

## 2019-05-09 ENCOUNTER — Other Ambulatory Visit: Payer: Self-pay

## 2019-05-09 ENCOUNTER — Non-Acute Institutional Stay (SKILLED_NURSING_FACILITY): Payer: Medicare Other | Admitting: Internal Medicine

## 2019-05-09 ENCOUNTER — Inpatient Hospital Stay (HOSPITAL_COMMUNITY)
Admission: EM | Admit: 2019-05-09 | Discharge: 2019-05-23 | DRG: 871 | Disposition: A | Discharge status: Other Acute Inpatient Hospital | Payer: Medicare Other | Attending: Internal Medicine | Admitting: Internal Medicine

## 2019-05-09 ENCOUNTER — Inpatient Hospital Stay (HOSPITAL_COMMUNITY): Payer: Medicare Other

## 2019-05-09 DIAGNOSIS — E870 Hyperosmolality and hypernatremia: Secondary | ICD-10-CM | POA: Diagnosis present

## 2019-05-09 DIAGNOSIS — E1165 Type 2 diabetes mellitus with hyperglycemia: Secondary | ICD-10-CM | POA: Diagnosis present

## 2019-05-09 DIAGNOSIS — Z515 Encounter for palliative care: Secondary | ICD-10-CM | POA: Diagnosis not present

## 2019-05-09 DIAGNOSIS — I69351 Hemiplegia and hemiparesis following cerebral infarction affecting right dominant side: Secondary | ICD-10-CM

## 2019-05-09 DIAGNOSIS — I10 Essential (primary) hypertension: Secondary | ICD-10-CM | POA: Diagnosis present

## 2019-05-09 DIAGNOSIS — K429 Umbilical hernia without obstruction or gangrene: Secondary | ICD-10-CM | POA: Diagnosis present

## 2019-05-09 DIAGNOSIS — Z86718 Personal history of other venous thrombosis and embolism: Secondary | ICD-10-CM

## 2019-05-09 DIAGNOSIS — I2782 Chronic pulmonary embolism: Secondary | ICD-10-CM | POA: Diagnosis present

## 2019-05-09 DIAGNOSIS — R140 Abdominal distension (gaseous): Secondary | ICD-10-CM | POA: Diagnosis not present

## 2019-05-09 DIAGNOSIS — N179 Acute kidney failure, unspecified: Secondary | ICD-10-CM | POA: Diagnosis present

## 2019-05-09 DIAGNOSIS — I69322 Dysarthria following cerebral infarction: Secondary | ICD-10-CM

## 2019-05-09 DIAGNOSIS — I69398 Other sequelae of cerebral infarction: Secondary | ICD-10-CM | POA: Diagnosis not present

## 2019-05-09 DIAGNOSIS — J9600 Acute respiratory failure, unspecified whether with hypoxia or hypercapnia: Secondary | ICD-10-CM | POA: Diagnosis present

## 2019-05-09 DIAGNOSIS — Z931 Gastrostomy status: Secondary | ICD-10-CM

## 2019-05-09 DIAGNOSIS — R6520 Severe sepsis without septic shock: Secondary | ICD-10-CM | POA: Diagnosis not present

## 2019-05-09 DIAGNOSIS — I639 Cerebral infarction, unspecified: Secondary | ICD-10-CM | POA: Diagnosis present

## 2019-05-09 DIAGNOSIS — J9601 Acute respiratory failure with hypoxia: Secondary | ICD-10-CM | POA: Diagnosis present

## 2019-05-09 DIAGNOSIS — K760 Fatty (change of) liver, not elsewhere classified: Secondary | ICD-10-CM | POA: Diagnosis present

## 2019-05-09 DIAGNOSIS — E0590 Thyrotoxicosis, unspecified without thyrotoxic crisis or storm: Secondary | ICD-10-CM | POA: Diagnosis not present

## 2019-05-09 DIAGNOSIS — A403 Sepsis due to Streptococcus pneumoniae: Secondary | ICD-10-CM | POA: Insufficient documentation

## 2019-05-09 DIAGNOSIS — J449 Chronic obstructive pulmonary disease, unspecified: Secondary | ICD-10-CM | POA: Diagnosis not present

## 2019-05-09 DIAGNOSIS — R0600 Dyspnea, unspecified: Secondary | ICD-10-CM | POA: Diagnosis not present

## 2019-05-09 DIAGNOSIS — N390 Urinary tract infection, site not specified: Secondary | ICD-10-CM | POA: Diagnosis present

## 2019-05-09 DIAGNOSIS — E872 Acidosis: Secondary | ICD-10-CM | POA: Diagnosis present

## 2019-05-09 DIAGNOSIS — R1310 Dysphagia, unspecified: Secondary | ICD-10-CM | POA: Diagnosis not present

## 2019-05-09 DIAGNOSIS — K117 Disturbances of salivary secretion: Secondary | ICD-10-CM

## 2019-05-09 DIAGNOSIS — J189 Pneumonia, unspecified organism: Secondary | ICD-10-CM | POA: Diagnosis present

## 2019-05-09 DIAGNOSIS — Z431 Encounter for attention to gastrostomy: Secondary | ICD-10-CM | POA: Diagnosis not present

## 2019-05-09 DIAGNOSIS — E785 Hyperlipidemia, unspecified: Secondary | ICD-10-CM | POA: Diagnosis present

## 2019-05-09 DIAGNOSIS — M6281 Muscle weakness (generalized): Secondary | ICD-10-CM | POA: Diagnosis not present

## 2019-05-09 DIAGNOSIS — R42 Dizziness and giddiness: Secondary | ICD-10-CM | POA: Diagnosis present

## 2019-05-09 DIAGNOSIS — J690 Pneumonitis due to inhalation of food and vomit: Secondary | ICD-10-CM | POA: Diagnosis present

## 2019-05-09 DIAGNOSIS — I69392 Facial weakness following cerebral infarction: Secondary | ICD-10-CM | POA: Diagnosis not present

## 2019-05-09 DIAGNOSIS — Z9981 Dependence on supplemental oxygen: Secondary | ICD-10-CM | POA: Diagnosis not present

## 2019-05-09 DIAGNOSIS — K5909 Other constipation: Secondary | ICD-10-CM | POA: Diagnosis present

## 2019-05-09 DIAGNOSIS — Z79899 Other long term (current) drug therapy: Secondary | ICD-10-CM

## 2019-05-09 DIAGNOSIS — R0602 Shortness of breath: Secondary | ICD-10-CM | POA: Diagnosis not present

## 2019-05-09 DIAGNOSIS — R0689 Other abnormalities of breathing: Secondary | ICD-10-CM | POA: Diagnosis not present

## 2019-05-09 DIAGNOSIS — E119 Type 2 diabetes mellitus without complications: Secondary | ICD-10-CM | POA: Diagnosis not present

## 2019-05-09 DIAGNOSIS — Z978 Presence of other specified devices: Secondary | ICD-10-CM | POA: Diagnosis not present

## 2019-05-09 DIAGNOSIS — G8191 Hemiplegia, unspecified affecting right dominant side: Secondary | ICD-10-CM | POA: Diagnosis not present

## 2019-05-09 DIAGNOSIS — J410 Simple chronic bronchitis: Secondary | ICD-10-CM | POA: Diagnosis not present

## 2019-05-09 DIAGNOSIS — Z7901 Long term (current) use of anticoagulants: Secondary | ICD-10-CM | POA: Diagnosis not present

## 2019-05-09 DIAGNOSIS — Z1623 Resistance to quinolones and fluoroquinolones: Secondary | ICD-10-CM | POA: Diagnosis present

## 2019-05-09 DIAGNOSIS — R0603 Acute respiratory distress: Secondary | ICD-10-CM | POA: Diagnosis not present

## 2019-05-09 DIAGNOSIS — E860 Dehydration: Secondary | ICD-10-CM | POA: Diagnosis present

## 2019-05-09 DIAGNOSIS — R739 Hyperglycemia, unspecified: Secondary | ICD-10-CM | POA: Diagnosis not present

## 2019-05-09 DIAGNOSIS — IMO0002 Reserved for concepts with insufficient information to code with codable children: Secondary | ICD-10-CM | POA: Diagnosis present

## 2019-05-09 DIAGNOSIS — Z6835 Body mass index (BMI) 35.0-35.9, adult: Secondary | ICD-10-CM

## 2019-05-09 DIAGNOSIS — Z4682 Encounter for fitting and adjustment of non-vascular catheter: Secondary | ICD-10-CM | POA: Diagnosis not present

## 2019-05-09 DIAGNOSIS — E669 Obesity, unspecified: Secondary | ICD-10-CM | POA: Diagnosis present

## 2019-05-09 DIAGNOSIS — E058 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: Secondary | ICD-10-CM | POA: Diagnosis not present

## 2019-05-09 DIAGNOSIS — A498 Other bacterial infections of unspecified site: Secondary | ICD-10-CM | POA: Diagnosis present

## 2019-05-09 DIAGNOSIS — F0390 Unspecified dementia without behavioral disturbance: Secondary | ICD-10-CM | POA: Diagnosis present

## 2019-05-09 DIAGNOSIS — Z20828 Contact with and (suspected) exposure to other viral communicable diseases: Secondary | ICD-10-CM | POA: Diagnosis present

## 2019-05-09 DIAGNOSIS — Z993 Dependence on wheelchair: Secondary | ICD-10-CM | POA: Diagnosis not present

## 2019-05-09 DIAGNOSIS — H179 Unspecified corneal scar and opacity: Secondary | ICD-10-CM | POA: Diagnosis not present

## 2019-05-09 DIAGNOSIS — Z8249 Family history of ischemic heart disease and other diseases of the circulatory system: Secondary | ICD-10-CM

## 2019-05-09 DIAGNOSIS — Z880 Allergy status to penicillin: Secondary | ICD-10-CM

## 2019-05-09 DIAGNOSIS — R279 Unspecified lack of coordination: Secondary | ICD-10-CM | POA: Diagnosis not present

## 2019-05-09 DIAGNOSIS — K5981 Ogilvie syndrome: Secondary | ICD-10-CM | POA: Diagnosis not present

## 2019-05-09 DIAGNOSIS — A419 Sepsis, unspecified organism: Secondary | ICD-10-CM

## 2019-05-09 DIAGNOSIS — Z48815 Encounter for surgical aftercare following surgery on the digestive system: Secondary | ICD-10-CM | POA: Diagnosis not present

## 2019-05-09 DIAGNOSIS — I69328 Other speech and language deficits following cerebral infarction: Secondary | ICD-10-CM | POA: Diagnosis not present

## 2019-05-09 DIAGNOSIS — R1312 Dysphagia, oropharyngeal phase: Secondary | ICD-10-CM | POA: Diagnosis not present

## 2019-05-09 DIAGNOSIS — R918 Other nonspecific abnormal finding of lung field: Secondary | ICD-10-CM | POA: Diagnosis not present

## 2019-05-09 DIAGNOSIS — K6389 Other specified diseases of intestine: Secondary | ICD-10-CM | POA: Diagnosis not present

## 2019-05-09 DIAGNOSIS — I69391 Dysphagia following cerebral infarction: Secondary | ICD-10-CM | POA: Diagnosis not present

## 2019-05-09 DIAGNOSIS — Z7951 Long term (current) use of inhaled steroids: Secondary | ICD-10-CM

## 2019-05-09 DIAGNOSIS — J90 Pleural effusion, not elsewhere classified: Secondary | ICD-10-CM | POA: Diagnosis present

## 2019-05-09 DIAGNOSIS — Z741 Need for assistance with personal care: Secondary | ICD-10-CM | POA: Diagnosis not present

## 2019-05-09 DIAGNOSIS — B952 Enterococcus as the cause of diseases classified elsewhere: Secondary | ICD-10-CM | POA: Diagnosis not present

## 2019-05-09 DIAGNOSIS — J9690 Respiratory failure, unspecified, unspecified whether with hypoxia or hypercapnia: Secondary | ICD-10-CM

## 2019-05-09 DIAGNOSIS — Z9911 Dependence on respirator [ventilator] status: Secondary | ICD-10-CM

## 2019-05-09 DIAGNOSIS — Z7189 Other specified counseling: Secondary | ICD-10-CM | POA: Diagnosis not present

## 2019-05-09 LAB — BASIC METABOLIC PANEL
Anion gap: 12 (ref 5–15)
Anion gap: 8 (ref 5–15)
Anion gap: 5 (ref 5–15)
BUN: 82 mg/dL — ABNORMAL HIGH (ref 8–23)
BUN: 76 mg/dL — ABNORMAL HIGH (ref 8–23)
BUN: 61 mg/dL — ABNORMAL HIGH (ref 8–23)
CO2: 32 mmol/L (ref 22–32)
CO2: 31 mmol/L (ref 22–32)
CO2: 31 mmol/L (ref 22–32)
Calcium: 8.9 mg/dL (ref 8.9–10.3)
Calcium: 8.1 mg/dL — ABNORMAL LOW (ref 8.9–10.3)
Calcium: 7.9 mg/dL — ABNORMAL LOW (ref 8.9–10.3)
Chloride: 107 mmol/L (ref 98–111)
Chloride: 114 mmol/L — ABNORMAL HIGH (ref 98–111)
Chloride: 123 mmol/L — ABNORMAL HIGH (ref 98–111)
Creatinine, Ser: 2.78 mg/dL — ABNORMAL HIGH (ref 0.61–1.24)
Creatinine, Ser: 2.85 mg/dL — ABNORMAL HIGH (ref 0.61–1.24)
Creatinine, Ser: 2.28 mg/dL — ABNORMAL HIGH (ref 0.61–1.24)
GFR calc Af Amer: 25 mL/min — ABNORMAL LOW (ref >60)
GFR calc Af Amer: 24 mL/min — ABNORMAL LOW (ref >60)
GFR calc Af Amer: 32 mL/min — ABNORMAL LOW (ref >60)
GFR calc non Af Amer: 22 mL/min — ABNORMAL LOW (ref >60)
GFR calc non Af Amer: 21 mL/min — ABNORMAL LOW (ref >60)
GFR calc non Af Amer: 27 mL/min — ABNORMAL LOW (ref >60)
Glucose, Bld: 689 mg/dL (ref 70–99)
Glucose, Bld: 609 mg/dL (ref 70–99)
Glucose, Bld: 122 mg/dL — ABNORMAL HIGH (ref 70–99)
Potassium: 5.3 mmol/L — ABNORMAL HIGH (ref 3.5–5.1)
Potassium: 4.9 mmol/L (ref 3.5–5.1)
Potassium: 3.9 mmol/L (ref 3.5–5.1)
Sodium: 151 mmol/L — ABNORMAL HIGH (ref 135–145)
Sodium: 153 mmol/L — ABNORMAL HIGH (ref 135–145)
Sodium: 159 mmol/L — ABNORMAL HIGH (ref 135–145)

## 2019-05-09 LAB — BLOOD GAS, VENOUS
Acid-Base Excess: 5.9 mmol/L — ABNORMAL HIGH (ref 0.0–2.0)
Bicarbonate: 26.5 mmol/L (ref 20.0–28.0)
FIO2: 80
O2 Saturation: 74.1 %
Patient temperature: 38.3
pCO2, Ven: 72.1 mmHg (ref 44.0–60.0)
pH, Ven: 7.275 (ref 7.250–7.430)
pO2, Ven: 47.1 mmHg — ABNORMAL HIGH (ref 32.0–45.0)

## 2019-05-09 LAB — COMPREHENSIVE METABOLIC PANEL
ALT: 69 U/L — ABNORMAL HIGH (ref 0–44)
AST: 60 U/L — ABNORMAL HIGH (ref 15–41)
Albumin: 3.4 g/dL — ABNORMAL LOW (ref 3.5–5.0)
Alkaline Phosphatase: 72 U/L (ref 38–126)
Anion gap: 13 (ref 5–15)
BUN: 81 mg/dL — ABNORMAL HIGH (ref 8–23)
CO2: 32 mmol/L (ref 22–32)
Calcium: 9.1 mg/dL (ref 8.9–10.3)
Chloride: 107 mmol/L (ref 98–111)
Creatinine, Ser: 2.76 mg/dL — ABNORMAL HIGH (ref 0.61–1.24)
GFR calc Af Amer: 25 mL/min — ABNORMAL LOW (ref >60)
GFR calc non Af Amer: 22 mL/min — ABNORMAL LOW (ref >60)
Glucose, Bld: 676 mg/dL (ref 70–99)
Potassium: 4.8 mmol/L (ref 3.5–5.1)
Sodium: 152 mmol/L — ABNORMAL HIGH (ref 135–145)
Total Bilirubin: 0.7 mg/dL (ref 0.3–1.2)
Total Protein: 8.7 g/dL — ABNORMAL HIGH (ref 6.5–8.1)

## 2019-05-09 LAB — CBC WITH DIFFERENTIAL/PLATELET
Abs Immature Granulocytes: 0.03 10*3/uL (ref 0.00–0.07)
Abs Immature Granulocytes: 0.04 10*3/uL (ref 0.00–0.07)
Basophils Absolute: 0 10*3/uL (ref 0.0–0.1)
Basophils Absolute: 0 10*3/uL (ref 0.0–0.1)
Basophils Relative: 0 %
Basophils Relative: 0 %
Eosinophils Absolute: 0.2 10*3/uL (ref 0.0–0.5)
Eosinophils Absolute: 0.2 10*3/uL (ref 0.0–0.5)
Eosinophils Relative: 2 %
Eosinophils Relative: 2 %
HCT: 56.5 % — ABNORMAL HIGH (ref 39.0–52.0)
HCT: 58.4 % — ABNORMAL HIGH (ref 39.0–52.0)
Hemoglobin: 16.9 g/dL (ref 13.0–17.0)
Hemoglobin: 17.3 g/dL — ABNORMAL HIGH (ref 13.0–17.0)
Immature Granulocytes: 0 %
Immature Granulocytes: 0 %
Lymphocytes Relative: 14 %
Lymphocytes Relative: 14 %
Lymphs Abs: 1.8 10*3/uL (ref 0.7–4.0)
Lymphs Abs: 1.6 10*3/uL (ref 0.7–4.0)
MCH: 30.7 pg (ref 26.0–34.0)
MCH: 30.5 pg (ref 26.0–34.0)
MCHC: 29.9 g/dL — ABNORMAL LOW (ref 30.0–36.0)
MCHC: 29.6 g/dL — ABNORMAL LOW (ref 30.0–36.0)
MCV: 102.7 fL — ABNORMAL HIGH (ref 80.0–100.0)
MCV: 103 fL — ABNORMAL HIGH (ref 80.0–100.0)
Monocytes Absolute: 0.8 10*3/uL (ref 0.1–1.0)
Monocytes Absolute: 0.8 10*3/uL (ref 0.1–1.0)
Monocytes Relative: 6 %
Monocytes Relative: 6 %
Neutro Abs: 10.4 10*3/uL — ABNORMAL HIGH (ref 1.7–7.7)
Neutro Abs: 9.4 10*3/uL — ABNORMAL HIGH (ref 1.7–7.7)
Neutrophils Relative %: 78 %
Neutrophils Relative %: 78 %
Platelets: 261 10*3/uL (ref 150–400)
Platelets: 259 10*3/uL (ref 150–400)
RBC: 5.5 MIL/uL (ref 4.22–5.81)
RBC: 5.67 MIL/uL (ref 4.22–5.81)
RDW: 14.1 % (ref 11.5–15.5)
RDW: 14.2 % (ref 11.5–15.5)
WBC: 13.3 10*3/uL — ABNORMAL HIGH (ref 4.0–10.5)
WBC: 12.1 10*3/uL — ABNORMAL HIGH (ref 4.0–10.5)
nRBC: 0 % (ref 0.0–0.2)
nRBC: 0 % (ref 0.0–0.2)

## 2019-05-09 LAB — RENAL FUNCTION PANEL
Albumin: 2.8 g/dL — ABNORMAL LOW (ref 3.5–5.0)
Anion gap: 6 (ref 5–15)
BUN: 69 mg/dL — ABNORMAL HIGH (ref 8–23)
CO2: 31 mmol/L (ref 22–32)
Calcium: 8 mg/dL — ABNORMAL LOW (ref 8.9–10.3)
Chloride: 121 mmol/L — ABNORMAL HIGH (ref 98–111)
Creatinine, Ser: 2.71 mg/dL — ABNORMAL HIGH (ref 0.61–1.24)
GFR calc Af Amer: 26 mL/min — ABNORMAL LOW (ref >60)
GFR calc non Af Amer: 22 mL/min — ABNORMAL LOW (ref >60)
Glucose, Bld: 403 mg/dL — ABNORMAL HIGH (ref 70–99)
Phosphorus: 3 mg/dL (ref 2.5–4.6)
Potassium: 3.9 mmol/L (ref 3.5–5.1)
Sodium: 158 mmol/L — ABNORMAL HIGH (ref 135–145)

## 2019-05-09 LAB — LACTIC ACID, PLASMA
Lactic Acid, Venous: 2.1 mmol/L (ref 0.5–1.9)
Lactic Acid, Venous: 2.4 mmol/L (ref 0.5–1.9)
Lactic Acid, Venous: 2.5 mmol/L (ref 0.5–1.9)

## 2019-05-09 LAB — URINALYSIS, ROUTINE W REFLEX MICROSCOPIC
Bacteria, UA: NONE SEEN
Bilirubin Urine: NEGATIVE
Glucose, UA: >=500 mg/dL — AB
Ketones, ur: NEGATIVE mg/dL
Leukocytes,Ua: NEGATIVE
Nitrite: NEGATIVE
Protein, ur: NEGATIVE mg/dL
Specific Gravity, Urine: 1.02 (ref 1.005–1.030)
pH: 5 (ref 5.0–8.0)

## 2019-05-09 LAB — BLOOD GAS, ARTERIAL
Acid-Base Excess: 6.2 mmol/L — ABNORMAL HIGH (ref 0.0–2.0)
Bicarbonate: 28.3 mmol/L — ABNORMAL HIGH (ref 20.0–28.0)
Drawn by: 38235
FIO2: 80
O2 Saturation: 91.8 %
Patient temperature: 37
pCO2 arterial: 56.8 mmHg — ABNORMAL HIGH (ref 32.0–48.0)
pH, Arterial: 7.361 (ref 7.350–7.450)
pO2, Arterial: 65.9 mmHg — ABNORMAL LOW (ref 83.0–108.0)

## 2019-05-09 LAB — APTT: aPTT: 31 seconds (ref 24–36)

## 2019-05-09 LAB — PROTIME-INR
INR: 1.9 — ABNORMAL HIGH (ref 0.8–1.2)
Prothrombin Time: 21.3 seconds — ABNORMAL HIGH (ref 11.4–15.2)

## 2019-05-09 LAB — CBG MONITORING, ED
Glucose-Capillary: 540 mg/dL (ref 70–99)
Glucose-Capillary: 515 mg/dL (ref 70–99)
Glucose-Capillary: 486 mg/dL — ABNORMAL HIGH (ref 70–99)
Glucose-Capillary: 335 mg/dL — ABNORMAL HIGH (ref 70–99)

## 2019-05-09 LAB — GLUCOSE, CAPILLARY: Glucose-Capillary: 120 mg/dL — ABNORMAL HIGH (ref 70–99)

## 2019-05-09 LAB — BETA-HYDROXYBUTYRIC ACID: Beta-Hydroxybutyric Acid: 0.26 mmol/L (ref 0.05–0.27)

## 2019-05-09 LAB — MRSA PCR SCREENING: MRSA by PCR: NEGATIVE

## 2019-05-09 MED ORDER — INSULIN ASPART 100 UNIT/ML ~~LOC~~ SOLN: 0.0000 [IU] | Freq: Three times a day (TID) | SUBCUTANEOUS | Status: DC

## 2019-05-09 MED ORDER — POLYVINYL ALCOHOL 1.4 % OP SOLN
1.0000 [drp] | Freq: Three times a day (TID) | OPHTHALMIC | Status: DC
  Administered 2019-05-09 – 2019-05-23 (×41): 1 [drp] via OPHTHALMIC
  Filled 2019-05-09 (×7): qty 15

## 2019-05-09 MED ORDER — SODIUM CHLORIDE 0.9 % IV SOLN: 2.0000 g | INTRAVENOUS | Status: DC

## 2019-05-09 MED ORDER — TRAZODONE HCL 50 MG PO TABS
50.0000 mg | ORAL_TABLET | Freq: Every evening | ORAL | Status: DC | PRN
  Administered 2019-05-09 – 2019-05-14 (×4): 50 mg via ORAL
  Filled 2019-05-09 (×4): qty 1

## 2019-05-09 MED ORDER — DEXTROSE-NACL 5-0.45 % IV SOLN: INTRAVENOUS | Status: DC

## 2019-05-09 MED ORDER — VANCOMYCIN HCL 1.5 G IV SOLR: 1500.0000 mg | INTRAVENOUS | Status: DC

## 2019-05-09 MED ORDER — INSULIN GLARGINE 100 UNIT/ML ~~LOC~~ SOLN
15.0000 [IU] | Freq: Every day | SUBCUTANEOUS | Status: DC
  Administered 2019-05-09: 15 [IU] via SUBCUTANEOUS
  Filled 2019-05-09 (×2): qty 0.15

## 2019-05-09 MED ORDER — CARBOXYMETHYLCELLULOSE SODIUM 0.5 % OP SOLN: 1.0000 [drp] | Freq: Three times a day (TID) | OPHTHALMIC | Status: DC

## 2019-05-09 MED ORDER — SODIUM CHLORIDE 0.9 % IV BOLUS
1000.0000 mL | INTRAVENOUS | Status: DC
  Administered 2019-05-09: 14:00:00 1000 mL via INTRAVENOUS

## 2019-05-09 MED ORDER — SODIUM CHLORIDE 0.9 % IV BOLUS (SEPSIS): 200.0000 mL | Freq: Once | INTRAVENOUS | Status: DC

## 2019-05-09 MED ORDER — INSULIN ASPART 100 UNIT/ML ~~LOC~~ SOLN: 0.0000 [IU] | Freq: Every day | SUBCUTANEOUS | Status: DC

## 2019-05-09 MED ORDER — POLYETHYLENE GLYCOL 3350 17 G PO PACK
17.0000 g | PACK | Freq: Two times a day (BID) | ORAL | Status: DC
  Administered 2019-05-09 – 2019-05-17 (×9): 17 g via ORAL
  Filled 2019-05-09 (×10): qty 1

## 2019-05-09 MED ORDER — DEXTROSE 50 % IV SOLN: 0.0000 mL | INTRAVENOUS | Status: DC | PRN

## 2019-05-09 MED ORDER — METHIMAZOLE 5 MG PO TABS
5.0000 mg | ORAL_TABLET | Freq: Every day | ORAL | Status: DC
  Administered 2019-05-10 – 2019-05-17 (×6): 5 mg via ORAL
  Filled 2019-05-09 (×15): qty 1

## 2019-05-09 MED ORDER — BISACODYL 10 MG RE SUPP
10.0000 mg | Freq: Every day | RECTAL | Status: DC
  Administered 2019-05-09 – 2019-05-18 (×7): 10 mg via RECTAL
  Filled 2019-05-09 (×7): qty 1

## 2019-05-09 MED ORDER — SENNA-DOCUSATE SODIUM 8.6-50 MG PO TABS: 2.0000 | ORAL_TABLET | Freq: Two times a day (BID) | ORAL | Status: DC

## 2019-05-09 MED ORDER — SODIUM CHLORIDE 0.9 % IV BOLUS (SEPSIS)
1000.0000 mL | Freq: Once | INTRAVENOUS | Status: AC
  Administered 2019-05-09: 13:00:00 1000 mL via INTRAVENOUS

## 2019-05-09 MED ORDER — ATORVASTATIN CALCIUM 10 MG PO TABS
10.0000 mg | ORAL_TABLET | Freq: Every day | ORAL | Status: DC
  Administered 2019-05-10 – 2019-05-17 (×6): 10 mg via ORAL
  Filled 2019-05-09 (×6): qty 1

## 2019-05-09 MED ORDER — INSULIN GLARGINE 100 UNIT/ML ~~LOC~~ SOLN
10.0000 [IU] | Freq: Every day | SUBCUTANEOUS | Status: DC
  Administered 2019-05-10: 10 [IU] via SUBCUTANEOUS
  Filled 2019-05-09 (×6): qty 0.1

## 2019-05-09 MED ORDER — ACETAMINOPHEN 325 MG PO TABS: 650.0000 mg | ORAL_TABLET | Freq: Four times a day (QID) | ORAL | Status: DC | PRN

## 2019-05-09 MED ORDER — POLYETHYLENE GLYCOL 3350 17 G PO PACK: 17.0000 g | PACK | Freq: Every day | ORAL | Status: DC | PRN

## 2019-05-09 MED ORDER — INSULIN REGULAR(HUMAN) IN NACL 100-0.9 UT/100ML-% IV SOLN
INTRAVENOUS | Status: DC
  Administered 2019-05-09: 4.8 [IU]/h via INTRAVENOUS
  Filled 2019-05-09: qty 100

## 2019-05-09 MED ORDER — ONDANSETRON HCL 4 MG PO TABS: 4.0000 mg | ORAL_TABLET | Freq: Four times a day (QID) | ORAL | Status: DC | PRN

## 2019-05-09 MED ORDER — LABETALOL HCL 200 MG PO TABS: 200.0000 mg | ORAL_TABLET | Freq: Every day | ORAL | Status: DC

## 2019-05-09 MED ORDER — LORATADINE 10 MG PO TABS
10.0000 mg | ORAL_TABLET | Freq: Every day | ORAL | Status: DC
  Administered 2019-05-10 – 2019-05-14 (×4): 10 mg via ORAL
  Filled 2019-05-09 (×5): qty 1

## 2019-05-09 MED ORDER — MOMETASONE FURO-FORMOTEROL FUM 100-5 MCG/ACT IN AERO
2.0000 | INHALATION_SPRAY | Freq: Two times a day (BID) | RESPIRATORY_TRACT | Status: DC
  Administered 2019-05-11 – 2019-05-14 (×8): 2 via RESPIRATORY_TRACT
  Filled 2019-05-09: qty 8.8

## 2019-05-09 MED ORDER — SODIUM CHLORIDE 0.9% FLUSH: 3.0000 mL | INTRAVENOUS | Status: DC | PRN

## 2019-05-09 MED ORDER — ALBUTEROL SULFATE (2.5 MG/3ML) 0.083% IN NEBU
2.5000 mg | INHALATION_SOLUTION | RESPIRATORY_TRACT | Status: DC | PRN
  Administered 2019-05-11 – 2019-05-18 (×6): 2.5 mg via RESPIRATORY_TRACT
  Filled 2019-05-09 (×7): qty 3

## 2019-05-09 MED ORDER — INSULIN ASPART 100 UNIT/ML ~~LOC~~ SOLN
20.0000 [IU] | Freq: Once | SUBCUTANEOUS | Status: AC
  Administered 2019-05-09: 18:00:00 20 [IU] via SUBCUTANEOUS
  Filled 2019-05-09: qty 1

## 2019-05-09 MED ORDER — SENNOSIDES-DOCUSATE SODIUM 8.6-50 MG PO TABS
2.0000 | ORAL_TABLET | Freq: Two times a day (BID) | ORAL | Status: DC
  Administered 2019-05-09 – 2019-05-17 (×14): 2 via ORAL
  Filled 2019-05-09 (×15): qty 2

## 2019-05-09 MED ORDER — INSULIN ASPART 100 UNIT/ML ~~LOC~~ SOLN: 3.0000 [IU] | Freq: Three times a day (TID) | SUBCUTANEOUS | Status: DC

## 2019-05-09 MED ORDER — UMECLIDINIUM BROMIDE 62.5 MCG/INH IN AEPB: 1.0000 | INHALATION_SPRAY | Freq: Every day | RESPIRATORY_TRACT | Status: DC

## 2019-05-09 MED ORDER — SODIUM CHLORIDE 0.9 % IV SOLN
INTRAVENOUS | Status: DC
  Administered 2019-05-09: 15:00:00 via INTRAVENOUS

## 2019-05-09 MED ORDER — SODIUM CHLORIDE 0.9 % IV SOLN: 250.0000 mL | INTRAVENOUS | Status: DC | PRN

## 2019-05-09 MED ORDER — ONDANSETRON HCL 4 MG/2ML IJ SOLN: 4.0000 mg | Freq: Four times a day (QID) | INTRAMUSCULAR | Status: DC | PRN

## 2019-05-09 MED ORDER — SODIUM CHLORIDE 0.9 % IV BOLUS
1000.0000 mL | Freq: Once | INTRAVENOUS | Status: AC
  Administered 2019-05-09: 17:00:00 1000 mL via INTRAVENOUS

## 2019-05-09 MED ORDER — SODIUM CHLORIDE 0.9 % IV SOLN
2.0000 g | Freq: Once | INTRAVENOUS | Status: AC
  Administered 2019-05-09: 2 g via INTRAVENOUS
  Filled 2019-05-09: qty 2

## 2019-05-09 MED ORDER — ACETAMINOPHEN 325 MG PO TABS
650.0000 mg | ORAL_TABLET | Freq: Four times a day (QID) | ORAL | Status: DC | PRN
  Administered 2019-05-09: 650 mg via ORAL
  Filled 2019-05-09: qty 2

## 2019-05-09 MED ORDER — SODIUM CHLORIDE 0.9% FLUSH
3.0000 mL | Freq: Two times a day (BID) | INTRAVENOUS | Status: DC
  Administered 2019-05-09 – 2019-05-23 (×23): 3 mL via INTRAVENOUS

## 2019-05-09 MED ORDER — IPRATROPIUM-ALBUTEROL 0.5-2.5 (3) MG/3ML IN SOLN
3.0000 mL | Freq: Three times a day (TID) | RESPIRATORY_TRACT | Status: DC
  Administered 2019-05-12: 3 mL via RESPIRATORY_TRACT

## 2019-05-09 MED ORDER — VANCOMYCIN HCL IN DEXTROSE 1-5 GM/200ML-% IV SOLN
1000.0000 mg | INTRAVENOUS | Status: AC
  Administered 2019-05-09 (×2): 1000 mg via INTRAVENOUS
  Filled 2019-05-09: qty 200

## 2019-05-09 MED ORDER — VANCOMYCIN HCL IN DEXTROSE 1-5 GM/200ML-% IV SOLN
1000.0000 mg | Freq: Once | INTRAVENOUS | Status: DC
  Filled 2019-05-09: qty 200

## 2019-05-09 MED ORDER — INSULIN ASPART 100 UNIT/ML ~~LOC~~ SOLN
0.0000 [IU] | SUBCUTANEOUS | Status: DC
  Administered 2019-05-10 (×2): 4 [IU] via SUBCUTANEOUS
  Administered 2019-05-10: 09:00:00 3 [IU] via SUBCUTANEOUS
  Administered 2019-05-10: 05:00:00 4 [IU] via SUBCUTANEOUS

## 2019-05-09 MED ORDER — SODIUM CHLORIDE 0.9 % IV SOLN
INTRAVENOUS | Status: DC
  Administered 2019-05-09: 17:00:00 150 mL/h via INTRAVENOUS
  Administered 2019-05-09: 21:00:00 via INTRAVENOUS

## 2019-05-09 MED ORDER — FLEET ENEMA 7-19 GM/118ML RE ENEM
1.0000 | ENEMA | Freq: Once | RECTAL | Status: AC
  Administered 2019-05-09: 1 via RECTAL

## 2019-05-09 MED ORDER — APIXABAN 5 MG PO TABS
5.0000 mg | ORAL_TABLET | Freq: Two times a day (BID) | ORAL | Status: DC
  Administered 2019-05-09 – 2019-05-11 (×5): 5 mg via ORAL
  Filled 2019-05-09 (×5): qty 1

## 2019-05-09 MED ORDER — ACETAMINOPHEN 650 MG RE SUPP: 650.0000 mg | Freq: Four times a day (QID) | RECTAL | Status: DC | PRN

## 2019-05-09 MED ORDER — METRONIDAZOLE IN NACL 5-0.79 MG/ML-% IV SOLN
500.0000 mg | Freq: Three times a day (TID) | INTRAVENOUS | Status: DC
  Administered 2019-05-09 – 2019-05-16 (×20): 500 mg via INTRAVENOUS
  Filled 2019-05-09 (×20): qty 100

## 2019-05-09 NOTE — H&P (Signed)
Patient Demographics:    [name] [name], is a 73 y.o. 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   COPD (chronic obstructive pulmonary disease) ([place])   Hyperthyroidism   Diabetes mellitus with hyperglycemia--new diagnosis    1)-sepsis secondary to bilateral pneumonia--suspect aspiration related and the patient with dysphagia--- has penicillin allergy -Pharmacy consult appreciated, start cefepime, vancomycin and Flagyl pending cultures -Lactic acidosis noted, IV fluid bolus given repeat lactic acid pending -Patient met sepsis criteria with transient hypotension requiring aggressive IV fluid boluses, tachypnea, hypoxia, leukocytosis and, reports of fever and -In ED systolic BP was 24/93 -Hold PTA labetalol due to transient hypotension in the setting of sepsis  2) bilateral pneumonia--- suspect aspiration related, treat as above #1 -Give bronchodilators and mucolytics  3) acute hypoxic respiratory failure--- secondary to #2 above, treat as above #1 and #2, -In ED triage Oxygen sats was down to 80 % on 2 L via nasal cannula, patient required NRB to keep O2 sats above 90 % -Consider BiPAP once COVID-19 test is back  4)New onset  DM severe hyperglycemia without frank DKA--blood glucose was 659, bicarb 32, anion gap range between 8 and 11 -Okay to transition from IV insulin to subcu insulin, started Lantus 10 units nightly along with sliding scale coverage -Aggressive IV hydration as ordered  5)HyperNatremia--corrected for his severe hyperglycemia his sodium is almost 160, suspect significant free water deficit in the patient with dysphagia who is on honey thickened liquids and now with severe hyperglycemia--hydrate IV, monitor serial BMP and adjust IV fluid rate and composition as indicated  6)AKI----acute kidney injury due to severe dehydration in the setting of sepsis with transient hypotension as well as significant dehydration and volume depletion in a patient who is on honey thickened liquids-     creatinine on admission=2.85  ,   baseline creatinine = 1.0 ( 6 months PTA ) renally adjust medications, avoid nephrotoxic agents/dehydration/hypotension -Hold Diovan and HCTZ  7)History of prior stroke/DVT/PE--- continue Eliquis  8)Dysphagia--- patient with prior stroke with right hemiparesis and dysphagia, PTA was on honey thickened liquids.  N.p.o. for now due to respiratory distress requiring nonrebreather bag. -Get speech pathology evaluation once more stable  9)Social/Ethics- Patient is a poor historian due to dysarthria from prior stroke and respiratory distress requiring nonrebreather bag -I called and spoke with patient's brother/POA -Mr Dejour Vos at  [phone] to obtain additional history -Plan of care and advanced directive discussed, patient is a full code  10)H/o Prior CVA with residual right sided hemiparesis and dysphagia--- continue Eliquis and Lipitor  11) Hyperthyroidism: -Continue methimazole, hold labetalol  12) history of recurrent sigmoid volvulus and chronic constipation--CT abdomen and pelvis without evidence of volvulus or SBO at this time, laxatives as ordered   With History of - Reviewed  by me  Past Medical History:  Diagnosis Date   Dysphasia    Fatty liver    Hemiplegia ([place])    Hypertension    Pneumonia    Pulmonary embolism ([place])    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([place])    Stroke [facility])       Past Surgical History:  Procedure Laterality Date   FLEXIBLE SIGMOIDOSCOPY N/A 10/21/2016   Procedure: FLEXIBLE SIGMOIDOSCOPY;  Surgeon: Rogene Houston, MD;  Location[facility];  Service: Endoscopy;  Laterality: N/A;   FLEXIBLE SIGMOIDOSCOPY N/A 10/25/2016   Procedure: FLEXIBLE SIGMOIDOSCOPY;  Surgeon: Rogene Houston, MD;  Location: [facility];  Service: Endoscopy;  Laterality: N/A;   unable        Chief Complaint  Patient presents with   Respiratory Distress      HPI:    [name] [name]  is a 73 y.o. 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, as well as history of hypothyroidism, HTN and recurrent episodes of sigmoid volvulus presents from [place] with hypoxia requiring nonrebreather bag, patient has been unwell for the last couple of days with worsening respiratory status and reported fevers, KUB done as outpatient on 05/08/2019 suggested possible sigmoid volvulus apparently - Patient is a poor historian due to dysarthria from prior stroke and respiratory distress requiring nonrebreather bag -I called and spoke with patient's brother/POA -Mr Mclane Arora at [phone] to obtain additional history  In ED triage Oxygen sats was down to 80 % on 2 L via nasal cannula, patient required NRB to keep O2 sats above 90 %  In ED systolic BP was 17/00 with respiratory rate of 29 increased work of breathing--- BP improved with IV fluid boluses,  -Chest x-ray with possible pneumonia -CT abdomen and pelvis with bilateral pneumonia and constipation with gaseous distention of proximal sigmoid: Without  frank volvulus or bowel obstruction  -Lactic acid was elevated at 2.4, IV fluids given, repeat lactic acid pending  In ED--blood glucose was 689, bicarb was 32, potassium was 5.3, sodium was up to 153, anion gap was down to 8, creatinine 2.85 from a baseline of 1.0 -UA with glucosuria but no ketones -WBC is 12.1  -Blood cultures and antibiotics initiated in ED, patient was also initiated on IV fluids for sepsis protocol along with IV insulin for hyperglycemia   Review of systems:    In addition to the HPI above,   A full Review of  Systems was done, all other systems reviewed are negative except as noted above in HPI , .    Social History:  Reviewed by me   Social History   Tobacco Use   Smoking status: Never Smoker   Smokeless tobacco: Never Used  Substance Use Topics   Alcohol use: No    Alcohol/week: 0.0 standard drinks     Family History :  Reviewed by me  HTN  Home Medications:   Prior to Admission medications  Medication Sig Start Date End Date Taking? Authorizing Provider  acetaminophen (TYLENOL) 325 MG tablet Take 650 mg by mouth every 6 (six) hours as needed for moderate pain.    Yes [provider]  apixaban (ELIQUIS) 5 MG TABS tablet Take 5 mg by mouth 2 (two) times daily. 01/23/19  Yes [provider]  atorvastatin (LIPITOR) 10 MG tablet Take 10 mg by mouth daily.   Yes [provider]  azithromycin (ZITHROMAX) 500 MG tablet Take 500 mg by mouth daily.   Yes [provider]  budesonide-formoterol (SYMBICORT) 80-4.5 MCG/ACT inhaler Inhale 2 puffs into the lungs 2 (two) times daily.   Yes [provider]  carboxymethylcellulose (REFRESH PLUS) 0.5 % SOLN Place 1 drop into the right eye 3 (three) times daily.    Yes [provider]  erythromycin ophthalmic ointment Place into the left eye. appy 1 ribbon ophthalmic (eye) 3 times a day   Yes [provider]  hydrochlorothiazide (HYDRODIURIL) 12.5 MG  tablet Take 12.5 mg by mouth daily.   Yes [provider]  ipratropium-albuterol (DUONEB) 0.5-2.5 (3) MG/3ML SOLN Take 3 mLs by nebulization every 6 (six) hours as needed. 05/15/19  Yes [provider]  labetalol (NORMODYNE) 200 MG tablet Take 200 mg by mouth daily. For HTN   Yes [provider]  loratadine (CLARITIN) 10 MG tablet Take 10 mg by mouth daily.   Yes [provider]  methimazole (TAPAZOLE) 5 MG tablet Take 5 mg by mouth daily. 11/28/18  Yes [provider]  sennosides-docusate sodium (SENOKOT-S) 8.6-50 MG tablet Take 2 tablets by mouth 2 (two) times daily. For constipation   Yes [provider]  umeclidinium bromide (INCRUSE ELLIPTA) 62.5 MCG/INH AEPB Inhale 1 puff into the lungs daily.   Yes [provider]  valsartan (DIOVAN) 80 MG tablet Take 80 mg by mouth daily. 09/19/18  Yes [provider]     Allergies:     Allergies  Allergen Reactions   Penicillins     Has patient had a PCN reaction causing immediate rash, facial/tongue/throat swelling, SOB or lightheadedness with hypotension: unknown Has patient had a PCN reaction causing severe rash involving mucus membranes or skin necrosis: unknown Has patient had a PCN reaction that required hospitalization: unknown Has patient had a PCN reaction occurring within the last 10 years: unknown If all of the above answers are "NO", then may proceed with Cephalosporin use. 5/3 Tolerated Cefepime x 1 dose in ED.      Physical Exam:   Vitals  Blood pressure 98/70, pulse 97, temperature 100.2 F (37.9 C), temperature source Rectal, resp. rate 19, height _0  (1.727 m), weight 106 kg, SpO2 95 %.  Physical Examination: General appearance -respiratory distress , ill appaearing mental status - alert, oriented to person, Nose- NRB  Eyes - sclera anicteric Neck - supple, no JVD elevation , Chest -diminished breath sounds bilaterally, scattered rhonchi and  wheezes Heart - S1 and S2 normal, regular Abdomen - soft, nontender, nondistended, umbilical hernia-soft Neurological -right-sided hemiparesis and facial droop with dysarthria-  not new  extremities - no pedal edema noted, intact peripheral pulses Skin - warm, dry    Data Review:    CBC Recent Labs  Lab 05/05/19 0400 05/09/19 1200 05/09/19 1310  WBC 12.3* 13.3* 12.1*  HGB 16.7 16.9 17.3*  HCT 53.4* 56.5* 58.4*  PLT 246 261 259  MCV 98.2 102.7* 103.0*  MCH 30.7 30.7 30.5  MCHC 31.3 29.9* 29.6*  RDW 13.7  14.1 14.2  LYMPHSABS 1.3 1.8 1.6  MONOABS 1.0 0.8 0.8  EOSABS 0.0 0.2 0.2  BASOSABS 0.0 0.0 0.0   ------------------------------------------------------------------------------------------------------------------  Chemistries  Recent Labs  Lab 05/09/19 1200 05/09/19 1310 05/09/19 1458  NA 151* 152* 153*  K 5.3* 4.8 4.9  CL 107 107 114*  CO2 32 32 31  GLUCOSE 689* 676* 609*  BUN 82* 81* 76*  CREATININE 2.78* 2.76* 2.85*  CALCIUM 8.9 9.1 8.1*  AST  --  60*  --   ALT  --  69*  --   ALKPHOS  --  72  --   BILITOT  --  0.7  --    ------------------------------------------------------------------------------------------------------------------ estimated creatinine clearance is 27.2 mL/min (A) (by C-G formula based on SCr of 2.85 mg/dL (H)). ------------------------------------------------------------------------------------------------------------------ No results for input(s): TSH, T4TOTAL, T3FREE, THYROIDAB in the last 72 hours.  Invalid input(s): FREET3   Coagulation profile Recent Labs  Lab 05/09/19 1310  INR 1.9*   ------------------------------------------------------------------------------------------------------------------- No results for input(s): DDIMER in the last 72 hours. -------------------------------------------------------------------------------------------------------------------  Cardiac Enzymes No results for input(s): CKMB,  TROPONINI, MYOGLOBIN in the last 168 hours.  Invalid input(s): CK ------------------------------------------------------------------------------------------------------------------    Component Value Date/Time   BNP 18.0 02/21/2017 0730   ---------------------------------------------------------------------------------------------------------------  Urinalysis    Component Value Date/Time   COLORURINE YELLOW 05/09/2019 [place] 05/09/2019 1255   LABSPEC 1.020 05/09/2019 1255   PHURINE 5.0 05/09/2019 1255   GLUCOSEU >=500 (A) 05/09/2019 1255   HGBUR SMALL (A) 05/09/2019 [place] 05/09/2019 [place] 05/09/2019 [place] 05/09/2019 1255   NITRITE NEGATIVE 05/09/2019 1255   LEUKOCYTESUR NEGATIVE 05/09/2019 1255  ----------------------------------------------------------------------------------------------------------------   Imaging Results:    Ct Abdomen Pelvis Wo Contrast  Result Date: 05/09/2019 CLINICAL DATA:  Abdominal distension. Possible volvulus. EXAM: CT ABDOMEN AND PELVIS WITHOUT CONTRAST TECHNIQUE: Multidetector CT imaging of the abdomen and pelvis was performed following the standard protocol without IV contrast. COMPARISON:  11/03/2016 FINDINGS: Lower chest: Motion artifact through the lung bases with consolidative opacities in the lower lobes and to a lesser extent posterior lingula and right middle lobe. No pleural effusion. Coronary atherosclerosis. Hepatobiliary: 2 cm cyst inferiorly in the right hepatic lobe, mildly enlarged from 2018. Contracted gallbladder. No biliary dilatation. Pancreas: Unremarkable. Spleen: Unremarkable. Adrenals/Urinary Tract: Unremarkable adrenal glands. Unchanged small calcifications in both renal hila which may represent vascular calcifications or non-obstructing calculi. No hydroureteronephrosis. Unremarkable bladder. Stomach/Bowel: The stomach is collapsed. There is no small  bowel dilatation. There is a moderately large amount of stool in the distal sigmoid colon and rectum. The more proximal sigmoid colon is redundant and moderately distended by gas. An ordinary amount of stool is present in the more proximal colon, which is nondilated. Left-sided colonic diverticulosis is noted without evidence of diverticulitis. The appendix is unremarkable. Vascular/Lymphatic: Aortic atherosclerosis without aneurysm. No enlarged lymph nodes. Reproductive: Unremarkable prostate. Other: No ascites or pneumoperitoneum. Fat containing paraumbilical hernia. Musculoskeletal: No acute osseous abnormality or suspicious osseous lesion. Mild lumbar levoscoliosis. Advanced lumbar spondylosis with interbody and facet ankylosis at L4-5. IMPRESSION: 1. Moderately large amount of stool in the distal sigmoid colon and rectum. Moderate gaseous distension of the more proximal sigmoid colon without evidence of volvulus or bowel obstruction. 2. Bibasilar lung consolidation concerning for pneumonia. 3. Aortic Atherosclerosis (ICD10-I70.0). Electronically Signed   By: Logan Bores M.D.   On: 05/09/2019 15:45   Dg Chest Port 1 View  Result Date: 05/09/2019 CLINICAL  DATA:  Shortness of breath.  Hypoxia. EXAM: PORTABLE CHEST 1 VIEW COMPARISON:  January 16, 2019. FINDINGS: Stable cardiomediastinal silhouette. No pneumothorax is noted. Hypoinflation of the lungs is noted. Mild bibasilar atelectasis or infiltrates are noted. Bony thorax is unremarkable. IMPRESSION: Mild bibasilar subsegmental atelectasis or infiltrates are noted. Electronically Signed   By: Marijo Conception M.D.   On: 05/09/2019 13:26    Radiological Exams on Admission: Ct Abdomen Pelvis Wo Contrast  Result Date: 05/09/2019 CLINICAL DATA:  Abdominal distension. Possible volvulus. EXAM: CT ABDOMEN AND PELVIS WITHOUT CONTRAST TECHNIQUE: Multidetector CT imaging of the abdomen and pelvis was performed following the standard protocol without IV contrast.  COMPARISON:  11/03/2016 FINDINGS: Lower chest: Motion artifact through the lung bases with consolidative opacities in the lower lobes and to a lesser extent posterior lingula and right middle lobe. No pleural effusion. Coronary atherosclerosis. Hepatobiliary: 2 cm cyst inferiorly in the right hepatic lobe, mildly enlarged from 2018. Contracted gallbladder. No biliary dilatation. Pancreas: Unremarkable. Spleen: Unremarkable. Adrenals/Urinary Tract: Unremarkable adrenal glands. Unchanged small calcifications in both renal hila which may represent vascular calcifications or non-obstructing calculi. No hydroureteronephrosis. Unremarkable bladder. Stomach/Bowel: The stomach is collapsed. There is no small bowel dilatation. There is a moderately large amount of stool in the distal sigmoid colon and rectum. The more proximal sigmoid colon is redundant and moderately distended by gas. An ordinary amount of stool is present in the more proximal colon, which is nondilated. Left-sided colonic diverticulosis is noted without evidence of diverticulitis. The appendix is unremarkable. Vascular/Lymphatic: Aortic atherosclerosis without aneurysm. No enlarged lymph nodes. Reproductive: Unremarkable prostate. Other: No ascites or pneumoperitoneum. Fat containing paraumbilical hernia. Musculoskeletal: No acute osseous abnormality or suspicious osseous lesion. Mild lumbar levoscoliosis. Advanced lumbar spondylosis with interbody and facet ankylosis at L4-5. IMPRESSION: 1. Moderately large amount of stool in the distal sigmoid colon and rectum. Moderate gaseous distension of the more proximal sigmoid colon without evidence of volvulus or bowel obstruction. 2. Bibasilar lung consolidation concerning for pneumonia. 3. Aortic Atherosclerosis (ICD10-I70.0). Electronically Signed   By: Logan Bores M.D.   On: 05/09/2019 15:45   Dg Chest Port 1 View  Result Date: 05/09/2019 CLINICAL DATA:  Shortness of breath.  Hypoxia. EXAM: PORTABLE  CHEST 1 VIEW COMPARISON:  January 16, 2019. FINDINGS: Stable cardiomediastinal silhouette. No pneumothorax is noted. Hypoinflation of the lungs is noted. Mild bibasilar atelectasis or infiltrates are noted. Bony thorax is unremarkable. IMPRESSION: Mild bibasilar subsegmental atelectasis or infiltrates are noted. Electronically Signed   By: Marijo Conception M.D.   On: 05/09/2019 13:26   DVT Prophylaxis -SCD /eliquis AM Labs Ordered, also please review Full Orders  Family Communication: Admission, patients condition and plan of care including tests being ordered have been discussed with the patient and brother/POA who indicate understanding and agree with the plan   Code Status - Full Code  Likely DC to  SNF  Condition   fair  Roxan Hockey M.D on 05/09/2019 at 5:09 PM Go to [email or web address] -  for contact info  [facility] - Office  [phone]

## 2019-05-09 NOTE — Care Management Important Message (Addendum)
  Discussed with Dr Lacinda Axon -- Pt with concerns about sigmoid volvulus with elevated lactic acid and leukocytosis -Dr. Lacinda Axon will obtain CT abdomen and pelvis with rectal contrast -Admission is deferred at this time by [facility] service -Pending further work-up Dr. Lacinda Axon will  Call back -Dr. Lacinda Axon will page Hospitalist service once w/u is completed --Also concerns about possible aspiration- with Hypoxia  Roxan Hockey, MD

## 2019-05-09 NOTE — Progress Notes (Signed)
Pharmacy Antibiotic Note  [name] [name] is a 73 y.o. male admitted on 05/09/2019 with pneumonia.  Pharmacy has been consulted for Vancomycin and cefepime dosing.  Plan: Vancomycin 2000mg  loading dose, then 1500mg  IV every 48 hours.  Goal trough 15-20 mcg/mL.  Cefepime 2gm IV q24h F/U cxs and clinical progress Monitor V/S, labs and levels as indicated  Height: 5\' 8"  (172.7 cm) Weight: 233 lb 11 oz (106 kg) IBW/kg (Calculated) : 68.4  Temp (24hrs), Avg:100.2 F (37.9 C), Min:100.2 F (37.9 C), Max:100.2 F (37.9 C)  Recent Labs  Lab 05/05/19 0400 05/09/19 1200 05/09/19 1310 05/09/19 1458  WBC 12.3* 13.3* 12.1*  --   CREATININE  --  2.78* 2.76* 2.85*  LATICACIDVEN  --   --  2.1* 2.4*    Estimated Creatinine Clearance: 27.2 mL/min (A) (by C-G formula based on SCr of 2.85 mg/dL (H)).   Normalized CrCl is 41mls/min  Allergies  Allergen Reactions  . Penicillins     Has patient had a PCN reaction causing immediate rash, facial/tongue/throat swelling, SOB or lightheadedness with hypotension: unknown Has patient had a PCN reaction causing severe rash involving mucus membranes or skin necrosis: unknown Has patient had a PCN reaction that required hospitalization: unknown Has patient had a PCN reaction occurring within the last 10 years: unknown If all of the above answers are "NO", then may proceed with Cephalosporin use. 5/3 Tolerated Cefepime x 1 dose in ED.     Antimicrobials this admission: Vancomycin 11/18>> Cefepime 11/18 >>   Microbiology results: 11/18 BCx: pending 11/18 UCx: pending 11/18 SARS-2-CV is pending  MRSA PCR:   Thank you for allowing pharmacy to be a part of this patient's care.  Isac Sarna, BS [place], [place] Clinical Pharmacist Pager [phone] 05/09/2019 4:59 PM

## 2019-05-09 NOTE — ED Notes (Signed)
Date and time results received: 05/09/19 1409 (use smartphrase ".now" to insert current time)  Test: pcO2Critical Value: 72.1  Name of Provider Notified: Dr Lacinda Axon Orders Received? Or Actions Taken?:NA

## 2019-05-09 NOTE — ED Notes (Addendum)
CRITICAL VALUE ALERT  Critical Value:  Lactic acid 2.1, Glucose: 676  Date & Time Notied:  05/09/2019, 1419  Provider Notified: Dr. Lacinda Axon  Orders Received/Actions taken:  No new orders

## 2019-05-09 NOTE — ED Triage Notes (Addendum)
Pt transferred over from [facility] due to low sats. Pt has audible congestion. Abdomen noted to be distended. Upon arrival sats 80 % on 2L via [place]. Placed on Non rebreather at 15 L with sats up to 92%. Pt has been on antibiotics

## 2019-05-09 NOTE — ED Provider Notes (Addendum)
[facility] EMERGENCY DEPARTMENT Provider Note   CSN: [id number] Arrival date & time: 05/09/19  1241     History   Chief Complaint Chief Complaint  Patient presents with  . Respiratory Distress    HPI [name] [name] is a 73 y.o. male.     Level 5 caveat for acuity of condition and dementia.  Chief complaint respiratory distress.  Resident [facility].  Transferred here secondary to rhonchorous respiration and low pulse ox.  Pulse ox improved with nonrebreather.  KUB from yesterday suggests a possible volvulus.     Past Medical History:  Diagnosis Date  . Dysphasia   . Fatty liver   . Hemiplegia (HCC)   . Hypertension   . Pneumonia   . Pulmonary embolism (HCC)   . 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(HCC)   . Stroke [facility])     Patient Active Problem List   Diagnosis Date Noted  . PNA (pneumonia) 05/09/2019  . HCAP (healthcare-associated pneumonia) 05/04/2019  . Elevated PSA, between 10 and less than 20 ng/ml 02/08/2019  . Aspiration pneumonia of right lower lobe (HCC) 01/30/2019  . 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 Dyslipidemia 08/25/2018  . Hyperthyroidism 04/05/2017  . Hypertension 10/27/2016  . 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 COPD (chronic obstructive pulmonary disease) (HCC) 10/19/2016  . Stroke/cerebrovascular accident (HCC) 03/20/2014  . Long term current use of anticoagulant therapy 10/21/2012  . DVT (deep venous thrombosis) (HCC) 09/26/2012  . Chronic pulmonary embolism (HCC) 09/26/2012  . Aspiration pneumonia of left lower lobe (HCC) 09/26/2012  . Constipation 09/26/2012    Past Surgical History:  Procedure Laterality Date  . FLEXIBLE SIGMOIDOSCOPY N/A 10/21/2016   Procedure: FLEXIBLE SIGMOIDOSCOPY;  Surgeon: Malissa Hippo, MD;  Location: [facility] ENDO SUITE;  Service: Endoscopy;   Laterality: N/A;  . FLEXIBLE SIGMOIDOSCOPY N/A 10/25/2016   Procedure: FLEXIBLE SIGMOIDOSCOPY;  Surgeon: Malissa Hippo, MD;  Location: [facility] ENDO SUITE;  Service: Endoscopy;  Laterality: N/A;  . unable          Home Medications    Prior to Admission medications   Medication Sig Start Date End Date Taking? Authorizing Provider  acetaminophen (TYLENOL) 325 MG tablet Take 650 mg by mouth every 6 (six) hours as needed for moderate pain.    Yes [provider]  apixaban (ELIQUIS) 5 MG TABS tablet Take 5 mg by mouth 2 (two) times daily. 01/23/19  Yes [provider]  atorvastatin (LIPITOR) 10 MG tablet Take 10 mg by mouth daily.   Yes [provider]  azithromycin (ZITHROMAX) 500 MG tablet Take 500 mg by mouth daily.   Yes [provider]  budesonide-formoterol (SYMBICORT) 80-4.5 MCG/ACT inhaler Inhale 2 puffs into the lungs 2 (two) times daily.   Yes [provider]  carboxymethylcellulose (REFRESH PLUS) 0.5 % SOLN Place 1 drop into the right eye 3 (three) times daily.    Yes [provider]  erythromycin ophthalmic ointment Place into the left eye. appy 1 ribbon ophthalmic (eye) 3 times a day   Yes [provider]  hydrochlorothiazide (HYDRODIURIL) 12.5 MG tablet Take 12.5 mg by mouth daily.   Yes [provider]  ipratropium-albuterol (DUONEB) 0.5-2.5 (3) MG/3ML SOLN Take 3 mLs by nebulization every 6 (six) hours as needed. 05/15/19  Yes [provider]  labetalol (NORMODYNE) 200 MG tablet Take 200 mg  by mouth daily. For HTN   Yes [provider]  loratadine (CLARITIN) 10 MG tablet Take 10 mg by mouth daily.   Yes [provider]  methimazole (TAPAZOLE) 5 MG tablet Take 5 mg by mouth daily. 11/28/18  Yes [provider]  sennosides-docusate sodium (SENOKOT-S) 8.6-50 MG tablet Take 2 tablets by mouth 2 (two) times daily. For constipation   Yes [provider]  umeclidinium bromide  (INCRUSE ELLIPTA) 62.5 MCG/INH AEPB Inhale 1 puff into the lungs daily.   Yes [provider]  valsartan (DIOVAN) 80 MG tablet Take 80 mg by mouth daily. 09/19/18  Yes [provider]    Family History No family history on file.  Social History Social History   Tobacco Use  . Smoking status: Never Smoker  . Smokeless tobacco: Never Used  Substance Use Topics  . Alcohol use: No    Alcohol/week: 0.0 standard drinks  . Drug use: No     Allergies   Penicillins   Review of Systems Review of Systems  Unable to perform ROS: Acuity of condition     Physical Exam Updated Vital Signs BP 102/70   Pulse 96   Resp (!) 23   Ht 5\' 8"  (1.727 m)   Wt 106 kg   SpO2 95%   BMI 35.53 kg/m   Physical Exam Vitals signs and nursing note reviewed.  Constitutional:      Appearance: He is well-developed.     Comments: Does not answer direct questions.  HENT:     Head: Normocephalic and atraumatic.  Eyes:     Conjunctiva/sclera: Conjunctivae normal.  Neck:     Musculoskeletal: Neck supple.  Cardiovascular:     Rate and Rhythm: Normal rate and regular rhythm.  Pulmonary:     Comments: Rhonchorous respirations. Abdominal:     General: Bowel sounds are normal.     Palpations: Abdomen is soft.  Musculoskeletal:     Comments: Unable  Skin:    General: Skin is warm and dry.  Neurological:     Comments: Unable.  Psychiatric:     Comments: Unable      ED Treatments / Results  Labs (all labs ordered are listed, but only abnormal results are displayed) Labs Reviewed  LACTIC ACID, PLASMA - Abnormal; Notable for the following components:      Result Value   Lactic Acid, Venous 2.1 (*)    All other components within normal limits  COMPREHENSIVE METABOLIC PANEL - Abnormal; Notable for the following components:   Sodium 152 (*)    Glucose, Bld 676 (*)    BUN 81 (*)    Creatinine, Ser 2.76 (*)    Total Protein 8.7 (*)    Albumin 3.4 (*)    AST 60 (*)    ALT  69 (*)    GFR calc non Af Amer 22 (*)    GFR calc Af Amer 25 (*)    All other components within normal limits  CBC WITH DIFFERENTIAL/PLATELET - Abnormal; Notable for the following components:   WBC 12.1 (*)    Hemoglobin 17.3 (*)    HCT 58.4 (*)    MCV 103.0 (*)    MCHC 29.6 (*)    Neutro Abs 9.4 (*)    All other components within normal limits  PROTIME-INR - Abnormal; Notable for the following components:   Prothrombin Time 21.3 (*)    INR 1.9 (*)    All other components within normal limits  URINALYSIS, ROUTINE  W REFLEX MICROSCOPIC - Abnormal; Notable for the following components:   Glucose, UA >=500 (*)    Hgb urine dipstick SMALL (*)    All other components within normal limits  BLOOD GAS, VENOUS - Abnormal; Notable for the following components:   pCO2, Ven 72.1 (*)    pO2, Ven 47.1 (*)    Acid-Base Excess 5.9 (*)    All other components within normal limits  CBG MONITORING, ED - Abnormal; Notable for the following components:   Glucose-Capillary 540 (*)    All other components within normal limits  CULTURE, BLOOD (ROUTINE X 2)  CULTURE, BLOOD (ROUTINE X 2)  URINE CULTURE  SARS CORONAVIRUS 2 (TAT 6-24 HRS)  APTT  BETA-HYDROXYBUTYRIC ACID  LACTIC ACID, PLASMA  BETA-HYDROXYBUTYRIC ACID  BASIC METABOLIC PANEL    EKG None  Radiology Dg Chest Port 1 View  Result Date: 05/09/2019 CLINICAL DATA:  Shortness of breath.  Hypoxia. EXAM: PORTABLE CHEST 1 VIEW COMPARISON:  January 16, 2019. FINDINGS: Stable cardiomediastinal silhouette. No pneumothorax is noted. Hypoinflation of the lungs is noted. Mild bibasilar atelectasis or infiltrates are noted. Bony thorax is unremarkable. IMPRESSION: Mild bibasilar subsegmental atelectasis or infiltrates are noted. Electronically Signed   By: Lupita RaiderJames  Green Jr M.D.   On: 05/09/2019 13:26    Procedures Procedures (including critical care time)  Medications Ordered in ED Medications  sodium chloride 0.9 % bolus 1,000 mL (0 mLs Intravenous  Stopped 05/09/19 1414)    And  sodium chloride 0.9 % bolus 1,000 mL (0 mLs Intravenous Stopped 05/09/19 1416)    And  sodium chloride 0.9 % bolus 200 mL (200 mLs Intravenous Not Given 05/09/19 1415)  vancomycin (VANCOCIN) IVPB 1000 mg/200 mL premix (1,000 mg Intravenous New Bag/Given 05/09/19 1320)  insulin regular, human (MYXREDLIN) 100 units/ 100 mL infusion (4.8 Units/hr Intravenous New Bag/Given 05/09/19 1443)  0.9 %  sodium chloride infusion ( Intravenous New Bag/Given 05/09/19 1444)  dextrose 5 %-0.45 % sodium chloride infusion (has no administration in time range)  dextrose 50 % solution 0-50 mL (has no administration in time range)  ceFEPIme (MAXIPIME) 2 g in sodium chloride 0.9 % 100 mL IVPB ( Intravenous Stopped 05/09/19 1349)     Initial Impression / Assessment and Plan / ED Course  I have reviewed the triage vital signs and the nursing notes.  Pertinent labs & imaging results that were available during my care of the patient were reviewed by me and considered in my medical decision making (see chart for details).        Code sepsis initiated including IV fluids and antibiotics.  Glucose elevated.  IV insulin initiated.  Chest x-ray suggests possibility of bibasilar infiltrates.  KUB reviewed from yesterday.  Possible volvulus.  Will get CT abdomen pelvis.  Initially discussed with Dr. Rebeca AllegraEmokpoe.  Awaiting CT report.   CRITICAL CARE Performed by: Donnetta HutchingBrian Tieshia Rettinger Total critical care time: 35 minutes Critical care time was exclusive of separately billable procedures and treating other patients. Critical care was necessary to treat or prevent imminent or life-threatening deterioration. Critical care was time spent personally by me on the following activities: development of treatment plan with patient and/or surrogate as well as nursing, discussions with consultants, evaluation of patient's response to treatment, examination of patient, obtaining history from patient or surrogate,  ordering and performing treatments and interventions, ordering and review of laboratory studies, ordering and review of radiographic studies, pulse oximetry and re-evaluation of patient's condition. Final Clinical Impressions(s) / ED Diagnoses  Final diagnoses:  Hyperglycemia  HCAP (healthcare-associated pneumonia)    ED Discharge Orders    None       Donnetta Hutching, MD 05/09/19 1519    Donnetta Hutching, MD 05/09/19 1531

## 2019-05-09 NOTE — ED Notes (Signed)
CRITICAL VALUE ALERT  Critical Value:  Glucose 689  Date & Time Notied:  05/09/19, 1320  Provider Notified: Dr. Lacinda Axon  Orders Received/Actions taken: no new orders

## 2019-05-09 NOTE — ED Notes (Signed)
EDP notified and at bedside 

## 2019-05-09 NOTE — ED Notes (Signed)
CRITICAL VALUE ALERT  Critical Value:  Lactic Acid 2.4 & Glucose 609  Date & Time Notied:  05/09/19 1535  Provider Notified: Dr. Lacinda Axon  Orders Received/Actions taken: EDP notified

## 2019-05-10 ENCOUNTER — Other Ambulatory Visit: Payer: Self-pay

## 2019-05-10 DIAGNOSIS — Z7189 Other specified counseling: Secondary | ICD-10-CM

## 2019-05-10 DIAGNOSIS — J189 Pneumonia, unspecified organism: Secondary | ICD-10-CM

## 2019-05-10 DIAGNOSIS — E870 Hyperosmolality and hypernatremia: Secondary | ICD-10-CM

## 2019-05-10 DIAGNOSIS — Z515 Encounter for palliative care: Secondary | ICD-10-CM

## 2019-05-10 DIAGNOSIS — J410 Simple chronic bronchitis: Secondary | ICD-10-CM

## 2019-05-10 LAB — CULTURE, BLOOD (ROUTINE X 2)
Culture: NO GROWTH
Culture: NO GROWTH
Special Requests: ADEQUATE

## 2019-05-10 LAB — BASIC METABOLIC PANEL
Anion gap: 8 (ref 5–15)
Anion gap: 4 — ABNORMAL LOW (ref 5–15)
BUN: 56 mg/dL — ABNORMAL HIGH (ref 8–23)
BUN: 44 mg/dL — ABNORMAL HIGH (ref 8–23)
CO2: 32 mmol/L (ref 22–32)
CO2: 33 mmol/L — ABNORMAL HIGH (ref 22–32)
Calcium: 8.2 mg/dL — ABNORMAL LOW (ref 8.9–10.3)
Calcium: 7.9 mg/dL — ABNORMAL LOW (ref 8.9–10.3)
Chloride: 121 mmol/L — ABNORMAL HIGH (ref 98–111)
Chloride: 123 mmol/L — ABNORMAL HIGH (ref 98–111)
Creatinine, Ser: 2.18 mg/dL — ABNORMAL HIGH (ref 0.61–1.24)
Creatinine, Ser: 1.9 mg/dL — ABNORMAL HIGH (ref 0.61–1.24)
GFR calc Af Amer: 34 mL/min — ABNORMAL LOW (ref >60)
GFR calc Af Amer: 40 mL/min — ABNORMAL LOW (ref >60)
GFR calc non Af Amer: 29 mL/min — ABNORMAL LOW (ref >60)
GFR calc non Af Amer: 34 mL/min — ABNORMAL LOW (ref >60)
Glucose, Bld: 169 mg/dL — ABNORMAL HIGH (ref 70–99)
Glucose, Bld: 159 mg/dL — ABNORMAL HIGH (ref 70–99)
Potassium: 4.6 mmol/L (ref 3.5–5.1)
Potassium: 4.3 mmol/L (ref 3.5–5.1)
Sodium: 161 mmol/L (ref 135–145)
Sodium: 160 mmol/L — ABNORMAL HIGH (ref 135–145)

## 2019-05-10 LAB — CBC
HCT: 54.4 % — ABNORMAL HIGH (ref 39.0–52.0)
Hemoglobin: 15.7 g/dL (ref 13.0–17.0)
MCH: 30.8 pg (ref 26.0–34.0)
MCHC: 28.9 g/dL — ABNORMAL LOW (ref 30.0–36.0)
MCV: 106.9 fL — ABNORMAL HIGH (ref 80.0–100.0)
Platelets: 221 10*3/uL (ref 150–400)
RBC: 5.09 MIL/uL (ref 4.22–5.81)
RDW: 14.3 % (ref 11.5–15.5)
WBC: 10.7 10*3/uL — ABNORMAL HIGH (ref 4.0–10.5)
nRBC: 0 % (ref 0.0–0.2)

## 2019-05-10 LAB — GLUCOSE, CAPILLARY
Glucose-Capillary: 156 mg/dL — ABNORMAL HIGH (ref 70–99)
Glucose-Capillary: 145 mg/dL — ABNORMAL HIGH (ref 70–99)
Glucose-Capillary: 180 mg/dL — ABNORMAL HIGH (ref 70–99)
Glucose-Capillary: 153 mg/dL — ABNORMAL HIGH (ref 70–99)

## 2019-05-10 LAB — SARS CORONAVIRUS 2 (TAT 6-24 HRS): SARS Coronavirus 2: NEGATIVE

## 2019-05-10 LAB — LACTIC ACID, PLASMA: Lactic Acid, Venous: 2 mmol/L (ref 0.5–1.9)

## 2019-05-10 MED ORDER — SODIUM CHLORIDE 0.9 % IV SOLN
2.0000 g | Freq: Two times a day (BID) | INTRAVENOUS | Status: DC
  Administered 2019-05-10 – 2019-05-15 (×12): 2 g via INTRAVENOUS
  Filled 2019-05-10 (×13): qty 2

## 2019-05-10 MED ORDER — SODIUM CHLORIDE 0.45 % IV SOLN
INTRAVENOUS | Status: DC
  Administered 2019-05-10 – 2019-05-15 (×6): via INTRAVENOUS

## 2019-05-10 MED ORDER — CHLORHEXIDINE GLUCONATE CLOTH 2 % EX PADS
6.0000 | MEDICATED_PAD | Freq: Every day | CUTANEOUS | Status: DC
  Administered 2019-05-10 – 2019-05-23 (×14): 6 via TOPICAL

## 2019-05-10 NOTE — Progress Notes (Signed)
Patient Demographics:    [name] [name], is a 73 y.o. male, DOB - [birth year], XBM:[id number]  Admit date - 05/09/2019   Admitting Physician Britanni Yarde Denton Brick, MD  Outpatient Primary MD for the patient is [name], MD  LOS - 1   Chief Complaint  Patient presents with  . Respiratory Distress        Subjective:    [name] today has  no emesis,  No chest pain,  -Cough, hypoxia and dyspnea persist, Requiring high flow oxygen -T-max is 100.4  Assessment  & Plan :    Principal Problem:   Sepsis secondary to pneumonia Active Problems:   Long term current use of anticoagulant therapy-on Eliquis due to history of DVT/PE and strokes   Bil PNA (pneumonia)--suspect aspiration related   Hypernatremia   Acute hypoxic respiratory failure due to bilateral pneumonia   Dysphagia due to old cerebrovascular accident-on honey thickened liquids   AKI (acute kidney injury) ([place])   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, as well as history of hyperthyroidism, HTN and recurrent episodes of sigmoid volvulus admitted on 05/09/2019 from [facility] with hypoxia requiring nonrebreather bag, patient has been unwell for the last couple of days with worsening respiratory status and reported fevers-imaging studies suggest bilateral pneumonia probably aspiration related, also found with severe hyperglycemia and severe hypernatremia without frank DKA   A/p 1)-Sepsis secondary to Bilateral Pneumonia--suspect aspiration  related and the patient with dysphagia--- has penicillin allergy -Pharmacy consult appreciated - C/n Cefepime, vancomycin and Flagyl pending cultures -Lactic acidosis noted,  -c/n IVF -Patient met sepsis criteria with transient hypotension requiring aggressive IV fluid boluses, tachypnea, hypoxia, leukocytosis and, reports of fever -Continue to Hold PTA labetalol due to transient hypotension in the setting of sepsis -COVID-19 test pending  2)Bilateral Pneumonia--- suspect aspiration related, treat as above #1 -Give bronchodilators and mucolytics -Significant hypoxia persist -WBC is down to 10.7 from 13.3 -Lactic acid is down to 2.0 from 2.5  3) acute hypoxic respiratory failure--- secondary to #2 above, treat as above #1 and #2, -Continue high flow oxygen supplementation, -Consider BiPAP once COVID-19 test is back  4)New onset DM severe hyperglycemia without frank DKA--blood glucose was 689, bicarb 32, anion gap range between 8 and 11 -A1c pending, Continue Lantus 10 units nightly along with sliding scale coverage -Continue IV fluids  5)HyperNatremia--sodium is 161 after aggressive IV hydration with NS solution, okay to change to half-normal saline - suspect significant free water deficit in the patient with dysphagia who is on honey thickened liquids and now with severe hyperglycemia-- monitor serial BMP and adjust IV fluid rate and composition as indicated  6)AKI----acute kidney injury due to severe dehydration in the setting of sepsis with transient hypotension as well as significant dehydration and volume depletion in a patient who is on honey thickened liquids-     creatinine on admission=2.85  ,   baseline creatinine = 1.0 (  6 months PTA ), creatinine is down to 2.18 with hydration,  renally adjust medications, avoid nephrotoxic agents/dehydration/hypotension -Continue to Hold Diovan and HCTZ  7)History of prior stroke/DVT/PE--- continue Eliquis  8)Dysphagia--- patient  with prior stroke with right hemiparesis and Dysphagia, PTA was on honey thickened liquids.  N.p.o. for now due to respiratory distress requiring high flow oxygen -Consider speech pathology evaluation once more stable  9)Social/Ethics- Patient is a poor historian due to dysarthria from prior stroke  -Discussed with patient's brother/POA -[name] at [phone] to obtain additional history -Plan of care and advanced directive discussed, patient is a full code  10)H/o Prior CVA with residual right sided hemiparesis and Dysphagia--- continue Eliquis and Lipitor  11)Hyperthyroidism: -Continue methimazole, hold labetalol  12)History of recurrent sigmoid volvulus and chronic constipation--CT abdomen and pelvis without evidence of volvulus or SBO at this time, laxatives as ordered   Disposition/Need for in-Hospital Stay- patient unable to be discharged at this time due to -sepsis in the setting of bilateral pneumonia requiring IV antibiotics and IV fluids with acute hypoxic respiratory failure requiring high flow oxygen  Code Status : Full Code  Family Communication:  Discussed with patient's brother/POA -[name] at [phone]  Disposition Plan  : TBD  Consults  :  na  DVT Prophylaxis  :  Eliquis - SCDs   Lab Results  Component Value Date   PLT 221 05/10/2019    Inpatient Medications  Scheduled Meds: . apixaban  5 mg Oral BID  . atorvastatin  10 mg Oral Daily  . bisacodyl  10 mg Rectal QHS  . Chlorhexidine Gluconate Cloth  6 each Topical Daily  . insulin aspart  0-20 Units Subcutaneous Q4H  . insulin aspart  3 Units Subcutaneous TID WC  . insulin glargine  10 Units Subcutaneous Daily  . [START ON 05/15/2019] ipratropium-albuterol  3 mL Nebulization TID  . loratadine  10 mg Oral Daily  . methimazole  5 mg Oral Daily  . mometasone-formoterol  2 puff Inhalation BID  . polyethylene glycol  17 g Oral BID  . polyvinyl alcohol  1 drop Right Eye TID  .  senna-docusate  2 tablet Oral BID  . sodium chloride flush  3 mL Intravenous Q12H   Continuous Infusions: . sodium chloride 150 mL/hr at 05/10/19 0854  . sodium chloride    . ceFEPime (MAXIPIME) IV 2 g (05/10/19 0857)  . metronidazole 100 mL/hr at 05/10/19 0552   PRN Meds:.sodium chloride, acetaminophen **OR** acetaminophen, albuterol, dextrose, ondansetron **OR** ondansetron (ZOFRAN) IV, polyethylene glycol, sodium chloride flush, traZODone   Anti-infectives (From admission, onward)   Start     Dose/Rate Route Frequency Ordered Stop   05/11/19 1600  vancomycin (VANCOCIN) 1,500 mg in sodium chloride 0.9 % 500 mL IVPB  Status:  Discontinued     1,500 mg 250 mL/hr over 120 Minutes Intravenous Every 48 hours 05/09/19 1704 05/10/19 0809   05/10/19 1400  ceFEPIme (MAXIPIME) 2 g in sodium chloride 0.9 % 100 mL IVPB  Status:  Discontinued     2 g 200 mL/hr over 30 Minutes Intravenous Every 24 hours 05/09/19 1704 05/10/19 0813   05/10/19 1000  ceFEPIme (MAXIPIME) 2 g in sodium chloride 0.9 % 100 mL IVPB     2 g 200 mL/hr over 30 Minutes Intravenous Every 12 hours 05/10/19 0813     05/09/19 1900  metroNIDAZOLE (FLAGYL) IVPB 500 mg     500 mg 100 mL/hr over 60 Minutes Intravenous Every 8 hours  05/09/19 1842     05/09/19 1400  vancomycin (VANCOCIN) IVPB 1000 mg/200 mL premix     1,000 mg 200 mL/hr over 60 Minutes Intravenous Every 1 hr x 2 05/09/19 1302 05/09/19 1643   05/09/19 1300  vancomycin (VANCOCIN) IVPB 1000 mg/200 mL premix  Status:  Discontinued     1,000 mg 200 mL/hr over 60 Minutes Intravenous  Once 05/09/19 1256 05/09/19 1302   05/09/19 1300  ceFEPIme (MAXIPIME) 2 g in sodium chloride 0.9 % 100 mL IVPB     2 g 200 mL/hr over 30 Minutes Intravenous  Once 05/09/19 1256 05/09/19 1349        Objective:   Vitals:   05/10/19 0530 05/10/19 0600 05/10/19 0700 05/10/19 0800  BP: 111/73 109/74 111/75 109/66  Pulse: 91  94 95  Resp: 17 16 20    Temp:      TempSrc:      SpO2: 100%   99% 99%  Weight:      Height:        Wt Readings from Last 3 Encounters:  05/09/19 106 kg  05/08/19 106.4 kg  05/07/19 106.4 kg     Intake/Output Summary (Last 24 hours) at 05/10/2019 1049 Last data filed at 05/10/2019 0700 Gross per 24 hour  Intake 5425.86 ml  Output 500 ml  Net 4925.86 ml   Physical Exam  Gen:- Awake Alert, ill-appearing HEENT:- [place].AT, No sclera icterus  Nose- HF [place]-Supple Neck,No JVD,.  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, no new focal deficits, no tremors   Data Review:   Micro Results Recent Results (from the past 240 hour(s))  Culture, blood (routine x 2)     Status: None   Collection Time: 05/05/19  4:00 AM   Specimen: BLOOD RIGHT HAND  Result Value Ref Range Status   Specimen Description BLOOD RIGHT HAND  Final   Special Requests   Final    BOTTLES DRAWN AEROBIC AND ANAEROBIC Blood Culture results may not be optimal due to an excessive volume of blood received in culture bottles   Culture   Final    NO GROWTH 5 DAYS Performed at [facility], [address] [place], [place] [address]    Report Status 05/10/2019 FINAL  Final  Culture, blood (routine x 2)     Status: None   Collection Time: 05/05/19  4:00 AM   Specimen: BLOOD LEFT HAND  Result Value Ref Range Status   Specimen Description BLOOD LEFT HAND  Final   Special Requests   Final    BOTTLES DRAWN AEROBIC AND ANAEROBIC Blood Culture adequate volume   Culture   Final    NO GROWTH 5 DAYS Performed at [facility], [address], [place], [place] [address]    Report Status 05/10/2019 FINAL  Final  Blood Culture (routine x 2)     Status: None (Preliminary result)    Collection Time: 05/09/19  1:10 PM   Specimen: BLOOD RIGHT HAND  Result Value Ref Range Status   Specimen Description BLOOD RIGHT HAND  Final   Special Requests   Final    BOTTLES DRAWN AEROBIC AND ANAEROBIC Blood Culture adequate volume   Culture   Final    NO GROWTH < 24  HOURS Performed at [facility], [address], [place], [place] [address]    Report Status PENDING  Incomplete  Blood Culture (routine x 2)     Status: None (Preliminary result)   Collection Time: 05/09/19  1:10 PM   Specimen: BLOOD LEFT HAND  Result Value Ref Range Status   Specimen Description BLOOD LEFT HAND  Final   Special Requests   Final    BOTTLES DRAWN AEROBIC AND ANAEROBIC Blood Culture adequate volume   Culture   Final    NO GROWTH < 24 HOURS Performed at [facility], [address], [place], [place] [address]    Report Status PENDING  Incomplete  MRSA PCR Screening     Status: None   Collection Time: 05/09/19  8:47 PM   Specimen: Nasal Mucosa; Nasopharyngeal  Result Value Ref Range Status   MRSA by PCR NEGATIVE NEGATIVE Final    Comment:        The GeneXpert MRSA Assay (FDA approved for NASAL specimens only), is one component of a comprehensive MRSA colonization surveillance program. It is not intended to diagnose MRSA infection nor to guide or monitor treatment for MRSA infections. Performed at [facility], [address] [place], [place] [address]     Radiology Reports Ct Abdomen Pelvis Wo Contrast  Result Date: 05/09/2019 CLINICAL DATA:  Abdominal distension. Possible volvulus. EXAM: CT ABDOMEN AND PELVIS WITHOUT CONTRAST TECHNIQUE: Multidetector CT imaging of the abdomen and pelvis was performed following the standard protocol without IV contrast. COMPARISON:  11/03/2016 FINDINGS: Lower chest: Motion artifact through the lung bases with consolidative opacities in the lower lobes and to a lesser extent posterior lingula and right middle lobe. No pleural effusion. Coronary  atherosclerosis. Hepatobiliary: 2 cm cyst inferiorly in the right hepatic lobe, mildly enlarged from 2018. Contracted gallbladder. No biliary dilatation. Pancreas: Unremarkable. Spleen: Unremarkable. Adrenals/Urinary Tract: Unremarkable adrenal glands. Unchanged small calcifications in both renal hila which may represent vascular calcifications or non-obstructing calculi. No hydroureteronephrosis. Unremarkable bladder. Stomach/Bowel: The stomach is collapsed. There is no small bowel dilatation. There is a moderately large amount of stool in the distal sigmoid colon and rectum. The more proximal sigmoid colon is redundant and moderately distended by gas. An ordinary amount of stool is present in the more proximal colon, which is nondilated. Left-sided colonic diverticulosis is noted without evidence of diverticulitis. The appendix is unremarkable. Vascular/Lymphatic: Aortic atherosclerosis without aneurysm. No enlarged lymph nodes. Reproductive: Unremarkable prostate. Other: No ascites or pneumoperitoneum. Fat containing paraumbilical hernia. Musculoskeletal: No acute osseous abnormality or suspicious osseous lesion. Mild lumbar levoscoliosis. Advanced lumbar spondylosis with interbody and facet ankylosis at L4-5. IMPRESSION: 1. Moderately large amount of stool in the distal sigmoid colon and rectum. Moderate gaseous distension of the more proximal sigmoid colon without evidence of volvulus or bowel obstruction. 2. Bibasilar lung consolidation concerning for pneumonia. 3. Aortic Atherosclerosis (ICD10-I70.0). Electronically Signed   By: Logan Bores M.D.   On: 05/09/2019 15:45   Dg Chest Port 1 View  Result Date: 05/09/2019 CLINICAL DATA:  Shortness of breath.  Hypoxia. EXAM: PORTABLE CHEST 1 VIEW COMPARISON:  January 16, 2019. FINDINGS: Stable cardiomediastinal silhouette. No pneumothorax is noted. Hypoinflation of the lungs is noted. Mild bibasilar atelectasis or infiltrates are noted. Bony thorax is  unremarkable. IMPRESSION: Mild bibasilar subsegmental atelectasis or infiltrates are noted. Electronically Signed   By: Marijo Conception M.D.   On: 05/09/2019 13:26     CBC Recent Labs  Lab 05/05/19 0400 05/09/19 1200 05/09/19 1310  05/10/19 0451  WBC 12.3* 13.3* 12.1* 10.7*  HGB 16.7 16.9 17.3* 15.7  HCT 53.4* 56.5* 58.4* 54.4*  PLT 246 261 259 221  MCV 98.2 102.7* 103.0* 106.9*  MCH 30.7 30.7 30.5 30.8  MCHC 31.3 29.9* 29.6* 28.9*  RDW 13.7 14.1 14.2 14.3  LYMPHSABS 1.3 1.8 1.6  --   MONOABS 1.0 0.8 0.8  --   EOSABS 0.0 0.2 0.2  --   BASOSABS 0.0 0.0 0.0  --     Chemistries  Recent Labs  Lab 05/09/19 1310 05/09/19 1458 05/09/19 1826 05/09/19 2314 05/10/19 0451  NA 152* 153* 158* 159* 161*  K 4.8 4.9 3.9 3.9 4.6  CL 107 114* 121* 123* 121*  CO2 32 31 31 31  32  GLUCOSE 676* 609* 403* 122* 169*  BUN 81* 76* 69* 61* 56*  CREATININE 2.76* 2.85* 2.71* 2.28* 2.18*  CALCIUM 9.1 8.1* 8.0* 7.9* 8.2*  AST 60*  --   --   --   --   ALT 69*  --   --   --   --   ALKPHOS 72  --   --   --   --   BILITOT 0.7  --   --   --   --    ------------------------------------------------------------------------------------------------------------------ No results for input(s): CHOL, HDL, LDLCALC, TRIG, CHOLHDL, LDLDIRECT in the last 72 hours.  Lab Results  Component Value Date   HGBA1C 6.0 04/26/2009   ------------------------------------------------------------------------------------------------------------------ No results for input(s): TSH, T4TOTAL, T3FREE, THYROIDAB in the last 72 hours.  Invalid input(s): FREET3 ------------------------------------------------------------------------------------------------------------------ No results for input(s): VITAMINB12, FOLATE, FERRITIN, TIBC, IRON, RETICCTPCT in the last 72 hours.  Coagulation profile Recent Labs  Lab 05/09/19 1310  INR 1.9*    No results for input(s): DDIMER in the last 72 hours.  Cardiac Enzymes No results  for input(s): CKMB, TROPONINI, MYOGLOBIN in the last 168 hours.  Invalid input(s): CK ------------------------------------------------------------------------------------------------------------------    Component Value Date/Time   BNP 18.0 02/21/2017 0730     Roxan Hockey M.D on 05/10/2019 at 10:49 AM  Go to [email or web address] - for contact info  [facility] - Office  [phone]

## 2019-05-10 NOTE — Progress Notes (Signed)
Sodium of 161 called by lab. RN of patient made aware.

## 2019-05-10 NOTE — Consult Note (Signed)
Consultation Note Date: 05/10/2019   Patient Name: [name] [name]  DOB: 04-22-1946  MRN: [id number]  Age / Sex: 73 y.o., male  PCP: [name], MD Referring Physician: Roxan Hockey, MD  Reason for Consultation: Establishing goals of care  HPI/Patient Profile: 73 y.o. male  with past medical history of stroke with residual dysphagia and right sided hemiparesis, on Eliquis, fatty liver, HTN, h/o PE, aspiration pneumonia, sigmoid volvulus admitted on 05/09/2019 with respiratory distress with bilateral pneumonia.   Clinical Assessment and Goals of Care: I have spoken with RN and reviewed status with RN and Dr. Denton Brick. I have reviewed records from hospitalization and from SNF. Mr. Derrick has progressing dysarthria and currently has high supplemental oxygen needs impacting ability to have a good conversation with Mr. Whichard.   I called and spoke with Mr. Gartin brother, [name]. [name] tells me that he has been overseeing Mr. Blankley's care since his stroke almost 17 years ago. He was able to have an outside visit with him 04/30/19 and felt he appeared at his normal status at that time. Although he does share that he has more difficulty understanding him recently as his dysarthria has been worsening. He also shares that an Therapist, sports at SNF shared with him that Mr. Hustead has declined and becoming more frustrated during [place] as he hates being confined to his room and is typically more social and out in his wheelchair. [name] says that Mr. Tutson does have 2 children but they are estranged and not in contact with their father.   We discussed GOC and expectations. [name] knows that his brother has been struggling with pneumonia at SNF and knows his risks of aspiration. [name] tells me that his brother has always desired aggressive care to help him live as long as possible. He would desire intubation if needed (I expressed my concern  with his acute respiratory failure and that this could be necessary). I discussed with him my concern for putting his brother through CPR and expectations but [name] feels that his brother would desire all measures. However, he does feel that he would NOT want long term vent support. [name] will share update and this conversation with their sister who is out of town but he keeps updated and involved via telephone.   All questions/concerns addressed. Emotional support provided.   Primary Decision Maker NEXT OF KIN brother [name] (adult children unavailable)    SUMMARY OF RECOMMENDATIONS   - Full aggressive care desired  Code Status/Advance Care Planning:  Full code   Symptom Management:   Per attending.   Palliative Prophylaxis:   Aspiration, Delirium Protocol, Frequent Pain Assessment, Oral Care and Turn Reposition  Additional Recommendations (Limitations, Scope, Preferences):  Full Scope Treatment  Psycho-social/Spiritual:   Desire for further Chaplaincy support:no  Additional Recommendations: Caregiving  Support/Resources  Prognosis:   Overall prognosis guarded given tenuous respiratory status with underlying poor functional status.   Discharge Planning: To Be Determined      Primary Diagnoses: Present on Admission: . Bil PNA (pneumonia)--suspect  aspiration related . Hypernatremia . Sepsis secondary to pneumonia . Acute hypoxic respiratory failure due to bilateral pneumonia . 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 COPD (chronic obstructive pulmonary disease) (HCC) . Hyperthyroidism . Diabetes mellitus with hyperglycemia--new diagnosis . AKI (acute kidney injury) (HCC)   I have reviewed the medical record, interviewed the patient and family, and examined the patient. The following aspects are pertinent.  Past Medical History:  Diagnosis Date  . Dysphasia   . Fatty liver   . Hemiplegia (HCC)   . Hypertension   . Pneumonia    . Pulmonary embolism (HCC)   . 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(HCC)   . Stroke [facility](HCC)    Social History   Socioeconomic History  . Marital status: Single    Spouse name: Not on file  . Number of children: Not on file  . Years of education: Not on file  . Highest education level: Not on file  Occupational History  . Not on file  Social Needs  . Financial resource strain: Not hard at all  . Food insecurity    Worry: Never true    Inability: Never true  . Transportation needs    Medical: No    Non-medical: No  Tobacco Use  . Smoking status: Never Smoker  . Smokeless tobacco: Never Used  Substance and Sexual Activity  . Alcohol use: No    Alcohol/week: 0.0 standard drinks  . Drug use: No  . Sexual activity: Not on file  Lifestyle  . Physical activity    Days per week: 0 days    Minutes per session: 0 min  . Stress: Not at all  Relationships  . Social Musicianconnections    Talks on phone: Never    Gets together: Once a week    Attends religious service: Never    Active member of club or organization: No    Attends meetings of clubs or organizations: Never    Relationship status: Never married  Other Topics Concern  . Not on file  Social History Narrative  . Not on file   No family history on file. Scheduled Meds: . apixaban  5 mg Oral BID  . atorvastatin  10 mg Oral Daily  . bisacodyl  10 mg Rectal QHS  . Chlorhexidine Gluconate Cloth  6 each Topical Daily  . insulin aspart  0-20 Units Subcutaneous Q4H  . insulin aspart  3 Units Subcutaneous TID WC  . insulin glargine  10 Units Subcutaneous Daily  . [START ON 05/15/2019] ipratropium-albuterol  3 mL Nebulization TID  . loratadine  10 mg Oral Daily  . methimazole  5 mg Oral Daily  . mometasone-formoterol  2 puff Inhalation BID  . polyethylene glycol  17 g Oral BID  . polyvinyl alcohol  1 drop Right Eye TID  . senna-docusate  2 tablet Oral BID  . sodium chloride flush  3 mL Intravenous Q12H   Continuous Infusions: . sodium  chloride 150 mL/hr at 05/10/19 1100  . sodium chloride    . ceFEPime (MAXIPIME) IV Stopped (05/10/19 [id number])  . metronidazole Stopped (05/10/19 0556)   PRN Meds:.sodium chloride, acetaminophen **OR** acetaminophen, albuterol, dextrose, ondansetron **OR** ondansetron (ZOFRAN) IV, sodium chloride flush, traZODone Allergies  Allergen Reactions  . Penicillins     Has patient had a PCN reaction causing immediate rash, facial/tongue/throat swelling, SOB or lightheadedness with hypotension: unknown Has patient had a PCN reaction causing severe rash involving mucus membranes or skin necrosis: unknown Has patient had a PCN  reaction that required hospitalization: unknown Has patient had a PCN reaction occurring within the last 10 years: unknown If all of the above answers are "NO", then may proceed with Cephalosporin use. 5/3 Tolerated Cefepime x 1 dose in ED.    Review of Systems  Physical Exam  Vital Signs: BP 106/72   Pulse 97   Temp 99.6 F (37.6 C) (Axillary)   Resp 19   Ht 5\' 8"  (1.727 m)   Wt 106 kg   SpO2 98%   BMI 35.53 kg/m  Pain Scale: CPOT   Pain Score: Asleep   SpO2: SpO2: 98 % O2 Device:SpO2: 98 % O2 Flow Rate: .O2 Flow Rate (L/min): 15 L/min  IO: Intake/output summary:   Intake/Output Summary (Last 24 hours) at 05/10/2019 1159 Last data filed at 05/10/2019 1100 Gross per 24 hour  Intake 6062.55 ml  Output 950 ml  Net 5112.55 ml    LBM: Last BM Date: 05/10/19 Baseline Weight: Weight: 106 kg Most recent weight: Weight: 106 kg     Palliative Assessment/Data:     Time In: 1600 Time Out: 1630 Time Total: 30 min Greater than 50%  of this time was spent counseling and coordinating care related to the above assessment and plan.  Signed by: 05/12/19, NP Palliative Medicine Team Pager # [phone] (M-F 8a-5p) Team Phone # [phone] (Nights/Weekends)     The above conversation was completed via telephone due to the visitor restrictions during  the COVID-19 pandemic. Thorough chart review and discussion with necessary members of the care team was completed as part of assessment. All issues were discussed and addressed but no physical exam was performed.

## 2019-05-10 NOTE — TOC Initial Note (Signed)
Transition of Care [facility]) - Initial/Assessment Note    Patient Details  Name: [name] [name] MRN: [id number] Date of Birth: 1945/09/08  Transition of Care [facility]) CM/SW Contact:    Shade Flood, LCSW Phone Number: 05/10/2019, 12:06 PM  Clinical Narrative:                  Pt admitted from [facility] where he is a long term care resident. Pt status discussed with MD in Progression today. MD indicated that pt has multiple medical problems and that he is quite sick. MD has been in touch with pt's brother/POA to update. Palliative Care consult requested by MD.  TOC will follow and assist as needed with pt's dc planning needs.   Expected Discharge Plan: Long Term Nursing Home Barriers to Discharge: Continued Medical Work up   Patient Goals and CMS Choice        Expected Discharge Plan and Services Expected Discharge Plan: [place] In-house Referral: Clinical Social Work     Living arrangements for the past 2 months: [place]                                      Prior Living Arrangements/Services Living arrangements for the past 2 months: [place] Lives with:: Facility Resident Patient language and need for interpreter reviewed:: Yes Do you feel safe going back to the place where you live?: Yes      Need for Family Participation in Patient Care: No (Comment) Care giver support system in place?: Yes (comment)   Criminal Activity/Legal Involvement Pertinent to Current Situation/Hospitalization: No - Comment as needed  Activities of Daily Living   ADL Screening (condition at time of admission) Patient's cognitive ability adequate to safely complete daily activities?: No Is the patient deaf or have difficulty hearing?: No Does the patient have difficulty seeing, even when wearing glasses/contacts?: Yes Does the patient have difficulty concentrating, remembering, or making decisions?: Yes Patient able to  express need for assistance with ADLs?: Yes Does the patient have difficulty dressing or bathing?: Yes Independently performs ADLs?: No Communication: Needs assistance Is this a change from baseline?: Pre-admission baseline Dressing (OT): Dependent Is this a change from baseline?: Pre-admission baseline Grooming: Needs assistance Is this a change from baseline?: Pre-admission baseline Feeding: Needs assistance Is this a change from baseline?: Pre-admission baseline Bathing: Dependent Is this a change from baseline?: Pre-admission baseline Toileting: Dependent Is this a change from baseline?: Pre-admission baseline In/Out Bed: Dependent Is this a change from baseline?: Pre-admission baseline Walks in Home: Dependent Is this a change from baseline?: Pre-admission baseline Does the patient have difficulty walking or climbing stairs?: Yes Weakness of Legs: Both Weakness of Arms/Hands: Both  Permission Sought/Granted                  Emotional Assessment Appearance:: Appears stated age Attitude/Demeanor/Rapport: Unable to Assess Affect (typically observed): Unable to Assess Orientation: : Oriented to Self Alcohol / Substance Use: Not Applicable Psych Involvement: No (comment)  Admission diagnosis:  Hyperglycemia [R73.9] HCAP (healthcare-associated pneumonia) [J18.9] PNA (pneumonia) [J18.9] Patient Active Problem List   Diagnosis Date Noted  . Bil PNA (pneumonia)--suspect aspiration related 05/09/2019  . Hypernatremia 05/09/2019  . Sepsis secondary to pneumonia 05/09/2019  . Acute hypoxic respiratory failure due to bilateral pneumonia 05/09/2019  . Diabetes mellitus with hyperglycemia--new diagnosis 05/09/2019  . AKI (acute kidney injury) ([place])  05/09/2019  . HCAP (healthcare-associated pneumonia) 05/04/2019  . Elevated PSA, between 10 and less than 20 ng/ml 02/08/2019  . Aspiration pneumonia of right lower lobe (HCC) 01/30/2019  . 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 Dysphagia due to old cerebrovascular accident-on honey thickened liquids 08/25/2018  . Dyslipidemia 08/25/2018  . Hyperthyroidism 04/05/2017  . Hypertension 10/27/2016  . 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 COPD (chronic obstructive pulmonary disease) (HCC) 10/19/2016  . 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 Long term current use of anticoagulant therapy-on Eliquis due to history of DVT/PE and strokes 10/21/2012  . DVT (deep venous thrombosis) (HCC) 09/26/2012  . Chronic pulmonary embolism (HCC) 09/26/2012  . Aspiration pneumonia of left lower lobe (HCC) 09/26/2012  . Constipation 09/26/2012   PCP:  Margit Hanks, MD Pharmacy:  No Pharmacies Listed    Social Determinants of Health (SDOH) Interventions    Readmission Risk Interventions Readmission Risk Prevention Plan 05/10/2019  Transportation Screening Complete  HRI or Home Care Consult Not Complete  HRI or Home Care Consult comments Pt resides in LTC at [facility]  Medication Review (RN Care Manager) Complete  Some recent data might be hidden

## 2019-05-11 DIAGNOSIS — I69391 Dysphagia following cerebral infarction: Secondary | ICD-10-CM

## 2019-05-11 DIAGNOSIS — Z7189 Other specified counseling: Secondary | ICD-10-CM

## 2019-05-11 DIAGNOSIS — Z515 Encounter for palliative care: Secondary | ICD-10-CM

## 2019-05-11 DIAGNOSIS — Z7901 Long term (current) use of anticoagulants: Secondary | ICD-10-CM

## 2019-05-11 DIAGNOSIS — I639 Cerebral infarction, unspecified: Secondary | ICD-10-CM

## 2019-05-11 DIAGNOSIS — B952 Enterococcus as the cause of diseases classified elsewhere: Secondary | ICD-10-CM | POA: Diagnosis present

## 2019-05-11 DIAGNOSIS — A498 Other bacterial infections of unspecified site: Secondary | ICD-10-CM | POA: Diagnosis present

## 2019-05-11 LAB — BASIC METABOLIC PANEL
Anion gap: 4 — ABNORMAL LOW (ref 5–15)
Anion gap: 8 (ref 5–15)
BUN: 33 mg/dL — ABNORMAL HIGH (ref 8–23)
BUN: 37 mg/dL — ABNORMAL HIGH (ref 8–23)
CO2: 28 mmol/L (ref 22–32)
CO2: 32 mmol/L (ref 22–32)
Calcium: 7.8 mg/dL — ABNORMAL LOW (ref 8.9–10.3)
Calcium: 7.8 mg/dL — ABNORMAL LOW (ref 8.9–10.3)
Chloride: 120 mmol/L — ABNORMAL HIGH (ref 98–111)
Chloride: 123 mmol/L — ABNORMAL HIGH (ref 98–111)
Creatinine, Ser: 1.81 mg/dL — ABNORMAL HIGH (ref 0.61–1.24)
Creatinine, Ser: 1.82 mg/dL — ABNORMAL HIGH (ref 0.61–1.24)
GFR calc Af Amer: 42 mL/min — ABNORMAL LOW (ref >60)
GFR calc Af Amer: 42 mL/min — ABNORMAL LOW (ref >60)
GFR calc non Af Amer: 36 mL/min — ABNORMAL LOW (ref >60)
GFR calc non Af Amer: 36 mL/min — ABNORMAL LOW (ref >60)
Glucose, Bld: 108 mg/dL — ABNORMAL HIGH (ref 70–99)
Glucose, Bld: 138 mg/dL — ABNORMAL HIGH (ref 70–99)
Potassium: 3.7 mmol/L (ref 3.5–5.1)
Potassium: 3.8 mmol/L (ref 3.5–5.1)
Sodium: 156 mmol/L — ABNORMAL HIGH (ref 135–145)
Sodium: 159 mmol/L — ABNORMAL HIGH (ref 135–145)

## 2019-05-11 LAB — CBC
HCT: 52.3 % — ABNORMAL HIGH (ref 39.0–52.0)
Hemoglobin: 15 g/dL (ref 13.0–17.0)
MCH: 30.7 pg (ref 26.0–34.0)
MCHC: 28.7 g/dL — ABNORMAL LOW (ref 30.0–36.0)
MCV: 107.2 fL — ABNORMAL HIGH (ref 80.0–100.0)
Platelets: 210 10*3/uL (ref 150–400)
RBC: 4.88 MIL/uL (ref 4.22–5.81)
RDW: 14.3 % (ref 11.5–15.5)
WBC: 11.1 10*3/uL — ABNORMAL HIGH (ref 4.0–10.5)
nRBC: 0 % (ref 0.0–0.2)

## 2019-05-11 LAB — GLUCOSE, CAPILLARY
Glucose-Capillary: 107 mg/dL — ABNORMAL HIGH (ref 70–99)
Glucose-Capillary: 94 mg/dL (ref 70–99)
Glucose-Capillary: 96 mg/dL (ref 70–99)
Glucose-Capillary: 101 mg/dL — ABNORMAL HIGH (ref 70–99)
Glucose-Capillary: 118 mg/dL — ABNORMAL HIGH (ref 70–99)

## 2019-05-11 LAB — HEMOGLOBIN A1C
Hgb A1c MFr Bld: 11.3 % — ABNORMAL HIGH (ref 4.8–5.6)
Mean Plasma Glucose: 278 mg/dL

## 2019-05-11 LAB — RENAL FUNCTION PANEL
Albumin: 2.6 g/dL — ABNORMAL LOW (ref 3.5–5.0)
Anion gap: 9 (ref 5–15)
BUN: 36 mg/dL — ABNORMAL HIGH (ref 8–23)
CO2: 29 mmol/L (ref 22–32)
Calcium: 7.8 mg/dL — ABNORMAL LOW (ref 8.9–10.3)
Chloride: 120 mmol/L — ABNORMAL HIGH (ref 98–111)
Creatinine, Ser: 1.61 mg/dL — ABNORMAL HIGH (ref 0.61–1.24)
GFR calc Af Amer: 48 mL/min — ABNORMAL LOW (ref >60)
GFR calc non Af Amer: 42 mL/min — ABNORMAL LOW (ref >60)
Glucose, Bld: 98 mg/dL (ref 70–99)
Phosphorus: 2 mg/dL — ABNORMAL LOW (ref 2.5–4.6)
Potassium: 3.8 mmol/L (ref 3.5–5.1)
Sodium: 158 mmol/L — ABNORMAL HIGH (ref 135–145)

## 2019-05-11 LAB — URINE CULTURE: Culture: >=100000 — AB

## 2019-05-11 MED ORDER — BISACODYL 5 MG PO TBEC
10.0000 mg | DELAYED_RELEASE_TABLET | Freq: Once | ORAL | Status: AC
  Administered 2019-05-11: 10 mg via ORAL
  Filled 2019-05-11: qty 2

## 2019-05-11 MED ORDER — INSULIN ASPART 100 UNIT/ML ~~LOC~~ SOLN: 0.0000 [IU] | Freq: Every day | SUBCUTANEOUS | Status: DC

## 2019-05-11 MED ORDER — INSULIN GLARGINE 100 UNIT/ML ~~LOC~~ SOLN
6.0000 [IU] | Freq: Every day | SUBCUTANEOUS | Status: DC
  Administered 2019-05-11 – 2019-05-12 (×2): 6 [IU] via SUBCUTANEOUS
  Filled 2019-05-11 (×5): qty 0.06

## 2019-05-11 MED ORDER — INSULIN ASPART 100 UNIT/ML ~~LOC~~ SOLN
0.0000 [IU] | Freq: Three times a day (TID) | SUBCUTANEOUS | Status: DC
  Administered 2019-05-13 – 2019-05-17 (×9): 1 [IU] via SUBCUTANEOUS

## 2019-05-11 MED ORDER — BISACODYL 10 MG RE SUPP
10.0000 mg | Freq: Once | RECTAL | Status: AC
  Administered 2019-05-11: 10 mg via RECTAL
  Filled 2019-05-11: qty 1

## 2019-05-11 MED ORDER — NITROFURANTOIN MONOHYD MACRO 100 MG PO CAPS
100.0000 mg | ORAL_CAPSULE | Freq: Two times a day (BID) | ORAL | Status: AC
  Administered 2019-05-11 – 2019-05-17 (×12): 100 mg via ORAL
  Filled 2019-05-11 (×16): qty 1

## 2019-05-11 NOTE — Consult Note (Addendum)
[facility] Consult  Reason for Consult: PEG placement; dysphagia and aspiration PNA  Referring Physician:  Dr. Mariea Clonts   Chief Complaint    Respiratory Distress      HPI: [name] [name] is a 73 y.o. 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 He has a history of PE, HTN, recurrent sigmoid volvulus, aspiration pneumonia, and is on Eliqius.  He lives at the [facility] for the last 17 years since his stroke, and had a prior PEG that was removed after his stroke as he was better able to swallow. Since that time, he has been placed on a dysphagia diet with honey thick liquids, and is unable to keep himself hydrated.  He also has aspiration pneumonia and is admitted with bilateral pneumonia.  The patient is able to converse some but is difficult to understand.  His brother, [name] [name] is his Management consultant, and has decided to proceed with PEG placement for feeds and free water given the dehydration and acute kidney injury along with the aspiration.  Past Medical History:  Diagnosis Date  . Dysphasia   . Fatty liver   . Hemiplegia (HCC)   . Hypertension   . Pneumonia   . Pulmonary embolism (HCC)   . 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(HCC)   . Stroke [facility])     Past Surgical History:  Procedure Laterality Date  . FLEXIBLE SIGMOIDOSCOPY N/A 10/21/2016   Procedure: FLEXIBLE SIGMOIDOSCOPY;  Surgeon: Malissa Hippo, MD;  Location[facility];  Service: Endoscopy;  Laterality: N/A;  . FLEXIBLE SIGMOIDOSCOPY N/A 10/25/2016   Procedure: FLEXIBLE SIGMOIDOSCOPY;  Surgeon: Malissa Hippo, MD;  Location: [facility] ENDO SUITE;  Service: Endoscopy;  Laterality: N/A;  . unable      No family history on file.  Social History   Tobacco Use  . Smoking status: Never Smoker  . Smokeless tobacco: Never Used  Substance Use Topics  . Alcohol use: No    Alcohol/week: 0.0 standard drinks  . Drug use: No    Medications:  I have reviewed the patient's current  medications. Prior to Admission:  Medications Prior to Admission  Medication Sig Dispense Refill Last Dose  . acetaminophen (TYLENOL) 325 MG tablet Take 650 mg by mouth every 6 (six) hours as needed for moderate pain.      Marland Kitchen apixaban (ELIQUIS) 5 MG TABS tablet Take 5 mg by mouth 2 (two) times daily.   05/09/2019 at 0900  . atorvastatin (LIPITOR) 10 MG tablet Take 10 mg by mouth daily.   05/09/2019 at 0900  . azithromycin (ZITHROMAX) 500 MG tablet Take 500 mg by mouth daily.   05/09/2019 at 0900  . budesonide-formoterol (SYMBICORT) 80-4.5 MCG/ACT inhaler Inhale 2 puffs into the lungs 2 (two) times daily.   05/09/2019 at 0900  . carboxymethylcellulose (REFRESH PLUS) 0.5 % SOLN Place 1 drop into the right eye 3 (three) times daily.    05/09/2019 at 0900  . erythromycin ophthalmic ointment Place into the left eye. appy 1 ribbon ophthalmic (eye) 3 times a day   05/09/2019 at 0900  . hydrochlorothiazide (HYDRODIURIL) 12.5 MG tablet Take 12.5 mg by mouth daily.   05/09/2019 at 0900  . [START ON 05/15/2019] ipratropium-albuterol (DUONEB) 0.5-2.5 (3) MG/3ML SOLN Take 3 mLs by nebulization every 6 (six) hours as needed.   05/09/2019 at 0900  . labetalol (NORMODYNE) 200 MG tablet Take 200 mg by mouth daily. For HTN   05/09/2019 at 0900  . loratadine (  CLARITIN) 10 MG tablet Take 10 mg by mouth daily.   05/09/2019 at 0900  . methimazole (TAPAZOLE) 5 MG tablet Take 5 mg by mouth daily.   05/09/2019 at 0900  . sennosides-docusate sodium (SENOKOT-S) 8.6-50 MG tablet Take 2 tablets by mouth 2 (two) times daily. For constipation   05/08/2019 at Unknown time  . umeclidinium bromide (INCRUSE ELLIPTA) 62.5 MCG/INH AEPB Inhale 1 puff into the lungs daily.   05/08/2019 at Unknown time  . valsartan (DIOVAN) 80 MG tablet Take 80 mg by mouth daily.   05/08/2019 at Unknown time   Scheduled: . apixaban  5 mg Oral BID  . atorvastatin  10 mg Oral Daily  . bisacodyl  10 mg Rectal QHS  . Chlorhexidine Gluconate Cloth  6  each Topical Daily  . insulin aspart  0-5 Units Subcutaneous QHS  . insulin aspart  0-6 Units Subcutaneous TID WC  . insulin aspart  3 Units Subcutaneous TID WC  . insulin glargine  6 Units Subcutaneous Daily  . [START ON 05/15/2019] ipratropium-albuterol  3 mL Nebulization TID  . loratadine  10 mg Oral Daily  . methimazole  5 mg Oral Daily  . mometasone-formoterol  2 puff Inhalation BID  . nitrofurantoin (macrocrystal-monohydrate)  100 mg Oral Q12H  . polyethylene glycol  17 g Oral BID  . polyvinyl alcohol  1 drop Right Eye TID  . senna-docusate  2 tablet Oral BID  . sodium chloride flush  3 mL Intravenous Q12H   Continuous: . sodium chloride 20 mL/hr at 05/11/19 1500  . sodium chloride    . ceFEPime (MAXIPIME) IV Stopped (05/11/19 1036)  . metronidazole Stopped (05/11/19 1302)   ZOX:WRUEAV chloride, acetaminophen **OR** acetaminophen, albuterol, dextrose, ondansetron **OR** ondansetron (ZOFRAN) IV, sodium chloride flush, traZODone  Allergies  Allergen Reactions  . Penicillins     Has patient had a PCN reaction causing immediate rash, facial/tongue/throat swelling, SOB or lightheadedness with hypotension: unknown Has patient had a PCN reaction causing severe rash involving mucus membranes or skin necrosis: unknown Has patient had a PCN reaction that required hospitalization: unknown Has patient had a PCN reaction occurring within the last 10 years: unknown If all of the above answers are "NO", then may proceed with Cephalosporin use. 5/3 Tolerated Cefepime x 1 dose in ED.      ROS:  Review of systems not obtained due to patient factors.  Blood pressure 125/79, pulse 89, temperature 98.1 F (36.7 C), temperature source Oral, resp. rate 18, height  (1.727 m), weight 106.8 kg, SpO2 (!) 89 %. Physical Exam Vitals signs reviewed.  Constitutional:      Appearance: He is obese.  HENT:     Head: Normocephalic.     Nose: Nose normal.     Mouth/Throat:     Mouth: Mucous  membranes are moist.  Eyes:     General: No scleral icterus. Neck:     Musculoskeletal: Neck supple.  Cardiovascular:     Rate and Rhythm: Normal rate.  Pulmonary:     Effort: Pulmonary effort is normal.  Abdominal:     General: There is distension.     Palpations: Abdomen is soft.     Tenderness: There is no abdominal tenderness.     Hernia: A hernia is present. Hernia is present in the umbilical area.     Comments: Reducible umbilical hernia  Musculoskeletal:     Comments: Right sided weakness, is moving right hand some  Skin:    General: Skin is  warm and dry.  Neurological:     Mental Status: He is alert. Mental status is at baseline.     Results: Results for orders placed or performed during the hospital encounter of 05/09/19 (from the past 48 hour(s))  CBG monitoring, ED     Status: Abnormal   Collection Time: 05/09/19  4:58 PM  Result Value Ref Range   Glucose-Capillary 486 (H) 70 - 99 mg/dL   Comment 1 Notify RN   CBG monitoring, ED     Status: Abnormal   Collection Time: 05/09/19  6:00 PM  Result Value Ref Range   Glucose-Capillary 335 (H) 70 - 99 mg/dL   Comment 1 Notify RN   Lactic acid, plasma     Status: Abnormal   Collection Time: 05/09/19  6:26 PM  Result Value Ref Range   Lactic Acid, Venous 2.5 (HH) 0.5 - 1.9 mmol/L    Comment: CRITICAL VALUE NOTED.  VALUE IS CONSISTENT WITH PREVIOUSLY REPORTED AND CALLED VALUE. Performed at [facility], [address] [place], [place] [address]   Renal function panel     Status: Abnormal   Collection Time: 05/09/19  6:26 PM  Result Value Ref Range   Sodium 158 (H) 135 - 145 mmol/L   Potassium 3.9 3.5 - 5.1 mmol/L    Comment: DELTA CHECK NOTED   Chloride 121 (H) 98 - 111 mmol/L   CO2 31 22 - 32 mmol/L   Glucose, Bld 403 (H) 70 - 99 mg/dL   BUN 69 (H) 8 - 23 mg/dL   Creatinine, Ser 9.142.71 (H) 0.61 - 1.24 mg/dL   Calcium 8.0 (L) 8.9 - 10.3 mg/dL   Phosphorus 3.0 2.5 - 4.6 mg/dL   Albumin 2.8 (L) 3.5 - 5.0 g/dL    GFR calc non Af Amer 22 (L) >60 mL/min   GFR calc Af Amer 26 (L) >60 mL/min   Anion gap 6 5 - 15    Comment: Performed at [facility], [address] [place], [place] [address]  Blood gas, arterial     Status: Abnormal   Collection Time: 05/09/19  8:26 PM  Result Value Ref Range   FIO2 80.00    Delivery systems HI FLOW NASAL CANNULA    pH, Arterial 7.361 7.350 - 7.450   pCO2 arterial 56.8 (H) 32.0 - 48.0 mmHg   pO2, Arterial 65.9 (L) 83.0 - 108.0 mmHg   Bicarbonate 28.3 (H) 20.0 - 28.0 mmol/L   Acid-Base Excess 6.2 (H) 0.0 - 2.0 mmol/L   O2 Saturation 91.8 %   Patient temperature 37.0    Collection site RIGHT RADIAL    Drawn by [id number]    Allens test (pass/fail) PASS PASS    Comment: Performed at [facility], [address] [place], [place] [address]  MRSA PCR Screening     Status: None   Collection Time: 05/09/19  8:47 PM   Specimen: Nasal Mucosa; Nasopharyngeal  Result Value Ref Range   MRSA by PCR NEGATIVE NEGATIVE    Comment:        The GeneXpert MRSA Assay (FDA approved for NASAL specimens only), is one component of a comprehensive MRSA colonization surveillance program. It is not intended to diagnose MRSA infection nor to guide or monitor treatment for MRSA infections. Performed at [facility], [address] [place], [place] [address]   Basic metabolic panel     Status: Abnormal   Collection Time: 05/09/19 11:14 PM  Result Value Ref Range   Sodium 159 (H)  135 - 145 mmol/L   Potassium 3.9 3.5 - 5.1 mmol/L   Chloride 123 (H) 98 - 111 mmol/L   CO2 31 22 - 32 mmol/L   Glucose, Bld 122 (H) 70 - 99 mg/dL   BUN 61 (H) 8 - 23 mg/dL   Creatinine, Ser 2.28 (H) 0.61 - 1.24 mg/dL   Calcium 7.9 (L) 8.9 - 10.3 mg/dL   GFR calc non Af Amer 27 (L) >60 mL/min   GFR calc Af Amer 32 (L) >60 mL/min   Anion gap 5 5 - 15    Comment: Performed at [facility], [address] [place], [place] [address]  Glucose, capillary     Status: Abnormal   Collection Time: 05/09/19 11:28 PM   Result Value Ref Range   Glucose-Capillary 120 (H) 70 - 99 mg/dL  Basic metabolic panel     Status: Abnormal   Collection Time: 05/10/19  4:51 AM  Result Value Ref Range   Sodium 161 (HH) 135 - 145 mmol/L    Comment: CRITICAL RESULT CALLED TO, READ BACK BY AND VERIFIED WITH: WAGONER,R AT 7:05AM ON 05/10/19 BY FESTERMAN,C    Potassium 4.6 3.5 - 5.1 mmol/L   Chloride 121 (H) 98 - 111 mmol/L   CO2 32 22 - 32 mmol/L   Glucose, Bld 169 (H) 70 - 99 mg/dL   BUN 56 (H) 8 - 23 mg/dL   Creatinine, Ser 2.18 (H) 0.61 - 1.24 mg/dL   Calcium 8.2 (L) 8.9 - 10.3 mg/dL   GFR calc non Af Amer 29 (L) >60 mL/min   GFR calc Af Amer 34 (L) >60 mL/min   Anion gap 8 5 - 15    Comment: Performed at [facility], [address], [place], [place] [address]  CBC     Status: Abnormal   Collection Time: 05/10/19  4:51 AM  Result Value Ref Range   WBC 10.7 (H) 4.0 - 10.5 K/uL   RBC 5.09 4.22 - 5.81 MIL/uL   Hemoglobin 15.7 13.0 - 17.0 g/dL   HCT 54.4 (H) 39.0 - 52.0 %   MCV 106.9 (H) 80.0 - 100.0 fL   MCH 30.8 26.0 - 34.0 pg   MCHC 28.9 (L) 30.0 - 36.0 g/dL   RDW 14.3 11.5 - 15.5 %   Platelets 221 150 - 400 K/uL   nRBC 0.0 0.0 - 0.2 %    Comment: Performed at [facility], [address], [place], [place] [address]  Lactic acid, plasma     Status: Abnormal   Collection Time: 05/10/19  4:51 AM  Result Value Ref Range   Lactic Acid, Venous 2.0 (HH) 0.5 - 1.9 mmol/L    Comment: CRITICAL VALUE NOTED.  VALUE IS CONSISTENT WITH PREVIOUSLY REPORTED AND CALLED VALUE. Performed at [facility], [address], [place], [place] [address]   Glucose, capillary     Status: Abnormal   Collection Time: 05/10/19  4:59 AM  Result Value Ref Range   Glucose-Capillary 156 (H) 70 - 99 mg/dL  Glucose, capillary     Status: Abnormal   Collection Time: 05/10/19  8:04 AM  Result Value Ref Range   Glucose-Capillary 145 (H) 70 - 99 mg/dL  Glucose, capillary     Status: Abnormal   Collection Time: 05/10/19 12:25 PM  Result  Value Ref Range   Glucose-Capillary 180 (H) 70 - 99 mg/dL  Basic metabolic panel     Status: Abnormal   Collection Time: 05/10/19  3:42 PM  Result Value  Ref Range   Sodium 160 (H) 135 - 145 mmol/L   Potassium 4.3 3.5 - 5.1 mmol/L   Chloride 123 (H) 98 - 111 mmol/L   CO2 33 (H) 22 - 32 mmol/L   Glucose, Bld 159 (H) 70 - 99 mg/dL   BUN 44 (H) 8 - 23 mg/dL   Creatinine, Ser 4.66 (H) 0.61 - 1.24 mg/dL   Calcium 7.9 (L) 8.9 - 10.3 mg/dL   GFR calc non Af Amer 34 (L) >60 mL/min   GFR calc Af Amer 40 (L) >60 mL/min   Anion gap 4 (L) 5 - 15    Comment: Performed at [facility], [address] [place], [place] [address]  Glucose, capillary     Status: Abnormal   Collection Time: 05/10/19  4:45 PM  Result Value Ref Range   Glucose-Capillary 153 (H) 70 - 99 mg/dL  Glucose, capillary     Status: Abnormal   Collection Time: 05/10/19  9:49 PM  Result Value Ref Range   Glucose-Capillary 107 (H) 70 - 99 mg/dL  Basic metabolic panel     Status: Abnormal   Collection Time: 05/11/19 12:13 AM  Result Value Ref Range   Sodium 159 (H) 135 - 145 mmol/L   Potassium 3.8 3.5 - 5.1 mmol/L   Chloride 123 (H) 98 - 111 mmol/L   CO2 32 22 - 32 mmol/L   Glucose, Bld 108 (H) 70 - 99 mg/dL   BUN 37 (H) 8 - 23 mg/dL   Creatinine, Ser 7.01 (H) 0.61 - 1.24 mg/dL   Calcium 7.8 (L) 8.9 - 10.3 mg/dL   GFR calc non Af Amer 36 (L) >60 mL/min   GFR calc Af Amer 42 (L) >60 mL/min   Anion gap 4 (L) 5 - 15    Comment: Performed at [facility], [address] [place], [place] [address]  Glucose, capillary     Status: None   Collection Time: 05/11/19  2:06 AM  Result Value Ref Range   Glucose-Capillary 94 70 - 99 mg/dL  Renal function panel     Status: Abnormal   Collection Time: 05/11/19  4:08 AM  Result Value Ref Range   Sodium 158 (H) 135 - 145 mmol/L   Potassium 3.8 3.5 - 5.1 mmol/L   Chloride 120 (H) 98 - 111 mmol/L   CO2 29 22 - 32 mmol/L   Glucose, Bld 98 70 - 99 mg/dL   BUN 36 (H) 8 - 23 mg/dL    Creatinine, Ser 0.30 (H) 0.61 - 1.24 mg/dL   Calcium 7.8 (L) 8.9 - 10.3 mg/dL   Phosphorus 2.0 (L) 2.5 - 4.6 mg/dL   Albumin 2.6 (L) 3.5 - 5.0 g/dL   GFR calc non Af Amer 42 (L) >60 mL/min   GFR calc Af Amer 48 (L) >60 mL/min   Anion gap 9 5 - 15    Comment: Performed at [facility], [address], [place], [place] [address]  CBC     Status: Abnormal   Collection Time: 05/11/19  4:08 AM  Result Value Ref Range   WBC 11.1 (H) 4.0 - 10.5 K/uL   RBC 4.88 4.22 - 5.81 MIL/uL   Hemoglobin 15.0 13.0 - 17.0 g/dL   HCT 00.7 (H) 62.2 - 63.3 %   MCV 107.2 (H) 80.0 - 100.0 fL   MCH 30.7 26.0 - 34.0 pg   MCHC 28.7 (L) 30.0 - 36.0 g/dL   RDW 35.4 56.2 - 56.3 %   Platelets  210 150 - 400 K/uL   nRBC 0.0 0.0 - 0.2 %    Comment: Performed at [facility], [address], [place], [place] [address]  Glucose, capillary     Status: None   Collection Time: 05/11/19  4:50 AM  Result Value Ref Range   Glucose-Capillary 96 70 - 99 mg/dL  Glucose, capillary     Status: Abnormal   Collection Time: 05/11/19  7:56 AM  Result Value Ref Range   Glucose-Capillary 101 (H) 70 - 99 mg/dL  Glucose, capillary     Status: Abnormal   Collection Time: 05/11/19 11:47 AM  Result Value Ref Range   Glucose-Capillary 118 (H) 70 - 99 mg/dL  Basic metabolic panel     Status: Abnormal   Collection Time: 05/11/19  4:01 PM  Result Value Ref Range   Sodium 156 (H) 135 - 145 mmol/L   Potassium 3.7 3.5 - 5.1 mmol/L   Chloride 120 (H) 98 - 111 mmol/L   CO2 28 22 - 32 mmol/L   Glucose, Bld 138 (H) 70 - 99 mg/dL   BUN 33 (H) 8 - 23 mg/dL   Creatinine, Ser 9.81 (H) 0.61 - 1.24 mg/dL   Calcium 7.8 (L) 8.9 - 10.3 mg/dL   GFR calc non Af Amer 36 (L) >60 mL/min   GFR calc Af Amer 42 (L) >60 mL/min   Anion gap 8 5 - 15    Comment: Performed at [facility], [address] [place], [place] [address]    CT a/p 11/18  -personally reviewed- distended colon with sigmoid stool, decompressed stomach that seems to taper up to the  abdomina wall anteriorly, likely still tacked due to prior PEG scarring   Assessment & Plan:  ADOLFO GRANIERI is a 73 y.o. male with dysphagia and aspiration chronically after a stroke with residual right sided hemiparesis and dysarthria.  On Eliquis at this time for history of his stroke and PE.  -Plan for PEG Wednesday, hold Eliquis -Will discuss with [name] closer to that time and discuss risk of bleeding, infection, malposition, injury to other organs like colon, esp given the dilation.  -Will be a permanent PEG likely given the patient's chronic issues and decline   Discussed with Dr. Mariea Clonts.   [name] [name] 05/11/2019, 4:49 PM

## 2019-05-11 NOTE — H&P (View-Only) (Signed)
[facility] Consult  Reason for Consult: PEG placement; dysphagia and aspiration PNA  Referring Physician:  Dr. Emokpae   Chief Complaint    Respiratory Distress      HPI: [name] [name] is a 73 y.o. 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 He has a history of PE, HTN, recurrent sigmoid volvulus, aspiration pneumonia, and is on Eliqius.  He lives at the [facility] for the last 17 years since his stroke, and had a prior PEG that was removed after his stroke as he was better able to swallow. Since that time, he has been placed on a dysphagia diet with honey thick liquids, and is unable to keep himself hydrated.  He also has aspiration pneumonia and is admitted with bilateral pneumonia.  The patient is able to converse some but is difficult to understand.  His brother, [name] [name] is his decision maker, and has decided to proceed with PEG placement for feeds and free water given the dehydration and acute kidney injury along with the aspiration.  Past Medical History:  Diagnosis Date  . Dysphasia   . Fatty liver   . Hemiplegia (HCC)   . Hypertension   . Pneumonia   . Pulmonary embolism (HCC)   . 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(HCC)   . Stroke (HCC)     Past Surgical History:  Procedure Laterality Date  . FLEXIBLE SIGMOIDOSCOPY N/A 10/21/2016   Procedure: FLEXIBLE SIGMOIDOSCOPY;  Surgeon: Rehman, Najeeb U, MD;  Location: [facility];  Service: Endoscopy;  Laterality: N/A;  . FLEXIBLE SIGMOIDOSCOPY N/A 10/25/2016   Procedure: FLEXIBLE SIGMOIDOSCOPY;  Surgeon: Rehman, Najeeb U, MD;  Location: [facility];  Service: Endoscopy;  Laterality: N/A;  . unable      No family history on file.  Social History   Tobacco Use  . Smoking status: Never Smoker  . Smokeless tobacco: Never Used  Substance Use Topics  . Alcohol use: No    Alcohol/week: 0.0 standard drinks  . Drug use: No    Medications:  I have reviewed the patient's current  medications. Prior to Admission:  Medications Prior to Admission  Medication Sig Dispense Refill Last Dose  . acetaminophen (TYLENOL) 325 MG tablet Take 650 mg by mouth every 6 (six) hours as needed for moderate pain.      . apixaban (ELIQUIS) 5 MG TABS tablet Take 5 mg by mouth 2 (two) times daily.   05/09/2019 at 0900  . atorvastatin (LIPITOR) 10 MG tablet Take 10 mg by mouth daily.   05/09/2019 at 0900  . azithromycin (ZITHROMAX) 500 MG tablet Take 500 mg by mouth daily.   05/09/2019 at 0900  . budesonide-formoterol (SYMBICORT) 80-4.5 MCG/ACT inhaler Inhale 2 puffs into the lungs 2 (two) times daily.   05/09/2019 at 0900  . carboxymethylcellulose (REFRESH PLUS) 0.5 % SOLN Place 1 drop into the right eye 3 (three) times daily.    05/09/2019 at 0900  . erythromycin ophthalmic ointment Place into the left eye. appy 1 ribbon ophthalmic (eye) 3 times a day   05/09/2019 at 0900  . hydrochlorothiazide (HYDRODIURIL) 12.5 MG tablet Take 12.5 mg by mouth daily.   05/09/2019 at 0900  . [START ON 05/15/2019] ipratropium-albuterol (DUONEB) 0.5-2.5 (3) MG/3ML SOLN Take 3 mLs by nebulization every 6 (six) hours as needed.   05/09/2019 at 0900  . labetalol (NORMODYNE) 200 MG tablet Take 200 mg by mouth daily. For HTN   05/09/2019 at 0900  . loratadine (  CLARITIN) 10 MG tablet Take 10 mg by mouth daily.   05/09/2019 at 0900  . methimazole (TAPAZOLE) 5 MG tablet Take 5 mg by mouth daily.   05/09/2019 at 0900  . sennosides-docusate sodium (SENOKOT-S) 8.6-50 MG tablet Take 2 tablets by mouth 2 (two) times daily. For constipation   05/08/2019 at Unknown time  . umeclidinium bromide (INCRUSE ELLIPTA) 62.5 MCG/INH AEPB Inhale 1 puff into the lungs daily.   05/08/2019 at Unknown time  . valsartan (DIOVAN) 80 MG tablet Take 80 mg by mouth daily.   05/08/2019 at Unknown time   Scheduled: . apixaban  5 mg Oral BID  . atorvastatin  10 mg Oral Daily  . bisacodyl  10 mg Rectal QHS  . Chlorhexidine Gluconate Cloth  6  each Topical Daily  . insulin aspart  0-5 Units Subcutaneous QHS  . insulin aspart  0-6 Units Subcutaneous TID WC  . insulin aspart  3 Units Subcutaneous TID WC  . insulin glargine  6 Units Subcutaneous Daily  . [START ON 05/15/2019] ipratropium-albuterol  3 mL Nebulization TID  . loratadine  10 mg Oral Daily  . methimazole  5 mg Oral Daily  . mometasone-formoterol  2 puff Inhalation BID  . nitrofurantoin (macrocrystal-monohydrate)  100 mg Oral Q12H  . polyethylene glycol  17 g Oral BID  . polyvinyl alcohol  1 drop Right Eye TID  . senna-docusate  2 tablet Oral BID  . sodium chloride flush  3 mL Intravenous Q12H   Continuous: . sodium chloride 20 mL/hr at 05/11/19 1500  . sodium chloride    . ceFEPime (MAXIPIME) IV Stopped (05/11/19 1036)  . metronidazole Stopped (05/11/19 1302)   PRN:sodium chloride, acetaminophen **OR** acetaminophen, albuterol, dextrose, ondansetron **OR** ondansetron (ZOFRAN) IV, sodium chloride flush, traZODone  Allergies  Allergen Reactions  . Penicillins     Has patient had a PCN reaction causing immediate rash, facial/tongue/throat swelling, SOB or lightheadedness with hypotension: unknown Has patient had a PCN reaction causing severe rash involving mucus membranes or skin necrosis: unknown Has patient had a PCN reaction that required hospitalization: unknown Has patient had a PCN reaction occurring within the last 10 years: unknown If all of the above answers are "NO", then may proceed with Cephalosporin use. 5/3 Tolerated Cefepime x 1 dose in ED.      ROS:  Review of systems not obtained due to patient factors.  Blood pressure 125/79, pulse 89, temperature 98.1 F (36.7 C), temperature source Oral, resp. rate 18, height 5' 8" (1.727 m), weight 106.8 kg, SpO2 (!) 89 %. Physical Exam Vitals signs reviewed.  Constitutional:      Appearance: He is obese.  HENT:     Head: Normocephalic.     Nose: Nose normal.     Mouth/Throat:     Mouth: Mucous  membranes are moist.  Eyes:     General: No scleral icterus. Neck:     Musculoskeletal: Neck supple.  Cardiovascular:     Rate and Rhythm: Normal rate.  Pulmonary:     Effort: Pulmonary effort is normal.  Abdominal:     General: There is distension.     Palpations: Abdomen is soft.     Tenderness: There is no abdominal tenderness.     Hernia: A hernia is present. Hernia is present in the umbilical area.     Comments: Reducible umbilical hernia  Musculoskeletal:     Comments: Right sided weakness, is moving right hand some  Skin:    General: Skin is   warm and dry.  Neurological:     Mental Status: He is alert. Mental status is at baseline.     Results: Results for orders placed or performed during the hospital encounter of 05/09/19 (from the past 48 hour(s))  CBG monitoring, ED     Status: Abnormal   Collection Time: 05/09/19  4:58 PM  Result Value Ref Range   Glucose-Capillary 486 (H) 70 - 99 mg/dL   Comment 1 Notify RN   CBG monitoring, ED     Status: Abnormal   Collection Time: 05/09/19  6:00 PM  Result Value Ref Range   Glucose-Capillary 335 (H) 70 - 99 mg/dL   Comment 1 Notify RN   Lactic acid, plasma     Status: Abnormal   Collection Time: 05/09/19  6:26 PM  Result Value Ref Range   Lactic Acid, Venous 2.5 (HH) 0.5 - 1.9 mmol/L    Comment: CRITICAL VALUE NOTED.  VALUE IS CONSISTENT WITH PREVIOUSLY REPORTED AND CALLED VALUE. Performed at [facility], [address] [place], [place] [address]   Renal function panel     Status: Abnormal   Collection Time: 05/09/19  6:26 PM  Result Value Ref Range   Sodium 158 (H) 135 - 145 mmol/L   Potassium 3.9 3.5 - 5.1 mmol/L    Comment: DELTA CHECK NOTED   Chloride 121 (H) 98 - 111 mmol/L   CO2 31 22 - 32 mmol/L   Glucose, Bld 403 (H) 70 - 99 mg/dL   BUN 69 (H) 8 - 23 mg/dL   Creatinine, Ser 2.71 (H) 0.61 - 1.24 mg/dL   Calcium 8.0 (L) 8.9 - 10.3 mg/dL   Phosphorus 3.0 2.5 - 4.6 mg/dL   Albumin 2.8 (L) 3.5 - 5.0 g/dL    GFR calc non Af Amer 22 (L) >60 mL/min   GFR calc Af Amer 26 (L) >60 mL/min   Anion gap 6 5 - 15    Comment: Performed at [facility], [address] [place], [place] [address]  Blood gas, arterial     Status: Abnormal   Collection Time: 05/09/19  8:26 PM  Result Value Ref Range   FIO2 80.00    Delivery systems HI FLOW NASAL CANNULA    pH, Arterial 7.361 7.350 - 7.450   pCO2 arterial 56.8 (H) 32.0 - 48.0 mmHg   pO2, Arterial 65.9 (L) 83.0 - 108.0 mmHg   Bicarbonate 28.3 (H) 20.0 - 28.0 mmol/L   Acid-Base Excess 6.2 (H) 0.0 - 2.0 mmol/L   O2 Saturation 91.8 %   Patient temperature 37.0    Collection site RIGHT RADIAL    Drawn by [id number]    Allens test (pass/fail) PASS PASS    Comment: Performed at [facility], [address] [place], [place] [address]  MRSA PCR Screening     Status: None   Collection Time: 05/09/19  8:47 PM   Specimen: Nasal Mucosa; Nasopharyngeal  Result Value Ref Range   MRSA by PCR NEGATIVE NEGATIVE    Comment:        The GeneXpert MRSA Assay (FDA approved for NASAL specimens only), is one component of a comprehensive MRSA colonization surveillance program. It is not intended to diagnose MRSA infection nor to guide or monitor treatment for MRSA infections. Performed at [facility], [address] [place], [place] [address]   Basic metabolic panel     Status: Abnormal   Collection Time: 05/09/19 11:14 PM  Result Value Ref Range   Sodium 159 (H)   135 - 145 mmol/L   Potassium 3.9 3.5 - 5.1 mmol/L   Chloride 123 (H) 98 - 111 mmol/L   CO2 31 22 - 32 mmol/L   Glucose, Bld 122 (H) 70 - 99 mg/dL   BUN 61 (H) 8 - 23 mg/dL   Creatinine, Ser 2.28 (H) 0.61 - 1.24 mg/dL   Calcium 7.9 (L) 8.9 - 10.3 mg/dL   GFR calc non Af Amer 27 (L) >60 mL/min   GFR calc Af Amer 32 (L) >60 mL/min   Anion gap 5 5 - 15    Comment: Performed at [facility], [address] [place], [place] [address]  Glucose, capillary     Status: Abnormal   Collection Time: 05/09/19 11:28 PM   Result Value Ref Range   Glucose-Capillary 120 (H) 70 - 99 mg/dL  Basic metabolic panel     Status: Abnormal   Collection Time: 05/10/19  4:51 AM  Result Value Ref Range   Sodium 161 (HH) 135 - 145 mmol/L    Comment: CRITICAL RESULT CALLED TO, READ BACK BY AND VERIFIED WITH: WAGONER,R AT 7:05AM ON 05/10/19 BY FESTERMAN,C    Potassium 4.6 3.5 - 5.1 mmol/L   Chloride 121 (H) 98 - 111 mmol/L   CO2 32 22 - 32 mmol/L   Glucose, Bld 169 (H) 70 - 99 mg/dL   BUN 56 (H) 8 - 23 mg/dL   Creatinine, Ser 2.18 (H) 0.61 - 1.24 mg/dL   Calcium 8.2 (L) 8.9 - 10.3 mg/dL   GFR calc non Af Amer 29 (L) >60 mL/min   GFR calc Af Amer 34 (L) >60 mL/min   Anion gap 8 5 - 15    Comment: Performed at [facility], [address] [place], [place] [address]  CBC     Status: Abnormal   Collection Time: 05/10/19  4:51 AM  Result Value Ref Range   WBC 10.7 (H) 4.0 - 10.5 K/uL   RBC 5.09 4.22 - 5.81 MIL/uL   Hemoglobin 15.7 13.0 - 17.0 g/dL   HCT 54.4 (H) 39.0 - 52.0 %   MCV 106.9 (H) 80.0 - 100.0 fL   MCH 30.8 26.0 - 34.0 pg   MCHC 28.9 (L) 30.0 - 36.0 g/dL   RDW 14.3 11.5 - 15.5 %   Platelets 221 150 - 400 K/uL   nRBC 0.0 0.0 - 0.2 %    Comment: Performed at [facility], [address] [place], [place] [address]  Lactic acid, plasma     Status: Abnormal   Collection Time: 05/10/19  4:51 AM  Result Value Ref Range   Lactic Acid, Venous 2.0 (HH) 0.5 - 1.9 mmol/L    Comment: CRITICAL VALUE NOTED.  VALUE IS CONSISTENT WITH PREVIOUSLY REPORTED AND CALLED VALUE. Performed at [facility], [address] [place], [place] [address]   Glucose, capillary     Status: Abnormal   Collection Time: 05/10/19  4:59 AM  Result Value Ref Range   Glucose-Capillary 156 (H) 70 - 99 mg/dL  Glucose, capillary     Status: Abnormal   Collection Time: 05/10/19  8:04 AM  Result Value Ref Range   Glucose-Capillary 145 (H) 70 - 99 mg/dL  Glucose, capillary     Status: Abnormal   Collection Time: 05/10/19 12:25 PM  Result  Value Ref Range   Glucose-Capillary 180 (H) 70 - 99 mg/dL  Basic metabolic panel     Status: Abnormal   Collection Time: 05/10/19  3:42 PM  Result Value   Ref Range   Sodium 160 (H) 135 - 145 mmol/L   Potassium 4.3 3.5 - 5.1 mmol/L   Chloride 123 (H) 98 - 111 mmol/L   CO2 33 (H) 22 - 32 mmol/L   Glucose, Bld 159 (H) 70 - 99 mg/dL   BUN 44 (H) 8 - 23 mg/dL   Creatinine, Ser 1.90 (H) 0.61 - 1.24 mg/dL   Calcium 7.9 (L) 8.9 - 10.3 mg/dL   GFR calc non Af Amer 34 (L) >60 mL/min   GFR calc Af Amer 40 (L) >60 mL/min   Anion gap 4 (L) 5 - 15    Comment: Performed at [facility], [address] [place], [place] [address]  Glucose, capillary     Status: Abnormal   Collection Time: 05/10/19  4:45 PM  Result Value Ref Range   Glucose-Capillary 153 (H) 70 - 99 mg/dL  Glucose, capillary     Status: Abnormal   Collection Time: 05/10/19  9:49 PM  Result Value Ref Range   Glucose-Capillary 107 (H) 70 - 99 mg/dL  Basic metabolic panel     Status: Abnormal   Collection Time: 05/11/19 12:13 AM  Result Value Ref Range   Sodium 159 (H) 135 - 145 mmol/L   Potassium 3.8 3.5 - 5.1 mmol/L   Chloride 123 (H) 98 - 111 mmol/L   CO2 32 22 - 32 mmol/L   Glucose, Bld 108 (H) 70 - 99 mg/dL   BUN 37 (H) 8 - 23 mg/dL   Creatinine, Ser 1.82 (H) 0.61 - 1.24 mg/dL   Calcium 7.8 (L) 8.9 - 10.3 mg/dL   GFR calc non Af Amer 36 (L) >60 mL/min   GFR calc Af Amer 42 (L) >60 mL/min   Anion gap 4 (L) 5 - 15    Comment: Performed at [facility], [address] [place], [place] [address]  Glucose, capillary     Status: None   Collection Time: 05/11/19  2:06 AM  Result Value Ref Range   Glucose-Capillary 94 70 - 99 mg/dL  Renal function panel     Status: Abnormal   Collection Time: 05/11/19  4:08 AM  Result Value Ref Range   Sodium 158 (H) 135 - 145 mmol/L   Potassium 3.8 3.5 - 5.1 mmol/L   Chloride 120 (H) 98 - 111 mmol/L   CO2 29 22 - 32 mmol/L   Glucose, Bld 98 70 - 99 mg/dL   BUN 36 (H) 8 - 23 mg/dL    Creatinine, Ser 1.61 (H) 0.61 - 1.24 mg/dL   Calcium 7.8 (L) 8.9 - 10.3 mg/dL   Phosphorus 2.0 (L) 2.5 - 4.6 mg/dL   Albumin 2.6 (L) 3.5 - 5.0 g/dL   GFR calc non Af Amer 42 (L) >60 mL/min   GFR calc Af Amer 48 (L) >60 mL/min   Anion gap 9 5 - 15    Comment: Performed at  Hospital, [address] [place], [place] [address]  CBC     Status: Abnormal   Collection Time: 05/11/19  4:08 AM  Result Value Ref Range   WBC 11.1 (H) 4.0 - 10.5 K/uL   RBC 4.88 4.22 - 5.81 MIL/uL   Hemoglobin 15.0 13.0 - 17.0 g/dL   HCT 52.3 (H) 39.0 - 52.0 %   MCV 107.2 (H) 80.0 - 100.0 fL   MCH 30.7 26.0 - 34.0 pg   MCHC 28.7 (L) 30.0 - 36.0 g/dL   RDW 14.3 11.5 - 15.5 %   Platelets   210 150 - 400 K/uL   nRBC 0.0 0.0 - 0.2 %    Comment: Performed at [facility], [address] [place], [place] [address]  Glucose, capillary     Status: None   Collection Time: 05/11/19  4:50 AM  Result Value Ref Range   Glucose-Capillary 96 70 - 99 mg/dL  Glucose, capillary     Status: Abnormal   Collection Time: 05/11/19  7:56 AM  Result Value Ref Range   Glucose-Capillary 101 (H) 70 - 99 mg/dL  Glucose, capillary     Status: Abnormal   Collection Time: 05/11/19 11:47 AM  Result Value Ref Range   Glucose-Capillary 118 (H) 70 - 99 mg/dL  Basic metabolic panel     Status: Abnormal   Collection Time: 05/11/19  4:01 PM  Result Value Ref Range   Sodium 156 (H) 135 - 145 mmol/L   Potassium 3.7 3.5 - 5.1 mmol/L   Chloride 120 (H) 98 - 111 mmol/L   CO2 28 22 - 32 mmol/L   Glucose, Bld 138 (H) 70 - 99 mg/dL   BUN 33 (H) 8 - 23 mg/dL   Creatinine, Ser 1.81 (H) 0.61 - 1.24 mg/dL   Calcium 7.8 (L) 8.9 - 10.3 mg/dL   GFR calc non Af Amer 36 (L) >60 mL/min   GFR calc Af Amer 42 (L) >60 mL/min   Anion gap 8 5 - 15    Comment: Performed at  Hospital, [address] [place], [place] [address]    CT a/p 11/18  -personally reviewed- distended colon with sigmoid stool, decompressed stomach that seems to taper up to the  abdomina wall anteriorly, likely still tacked due to prior PEG scarring   Assessment & Plan:  Brodyn T Schreiner is a 73 y.o. male with dysphagia and aspiration chronically after a stroke with residual right sided hemiparesis and dysarthria.  On Eliquis at this time for history of his stroke and PE.  -Plan for PEG Wednesday, hold Eliquis -Will discuss with [name] [name] closer to that time and discuss risk of bleeding, infection, malposition, injury to other organs like colon, esp given the dilation.  -Will be a permanent PEG likely given the patient's chronic issues and decline   Discussed with Dr. Emokpae.   Fawne Hughley C Elior Robinette 05/11/2019, 4:49 PM       

## 2019-05-11 NOTE — Progress Notes (Signed)
Patient Demographics:    [name] [name], is a 73 y.o. male, DOB - [birth year], CXK:[id number]  Admit date - 05/09/2019   Admitting Physician Ayomikun Starling Denton Brick, MD  Outpatient Primary MD for the patient is [name], MD  LOS - 2   Chief Complaint  Patient presents with   Respiratory Distress        Subjective:    [name] [name] today has  no emesis,  No chest pain,   -Fever resolving -No BM -Discussed with brother [name] -Cough and hypoxia and dyspnea persist--requiring high flow oxygen  Assessment  & Plan :    Principal Problem:   Sepsis secondary to pneumonia Active Problems:   Long term current use of anticoagulant therapy-on Eliquis due to history of DVT/PE and strokes   Bil PNA (pneumonia)--suspect aspiration related   Hypernatremia   Acute hypoxic respiratory failure due to bilateral pneumonia   Dysphagia due to old cerebrovascular accident-on honey thickened liquids   AKI (acute kidney injury) ([place])   Enterococcus faecalis UTI infection   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, as well as history of hyperthyroidism, HTN and recurrent episodes of sigmoid volvulus admitted on 05/09/2019 from [facility] with hypoxia requiring nonrebreather bag, patient has been unwell for the last couple of days with worsening respiratory status and reported fevers-imaging studies suggest bilateral pneumonia probably aspiration related,  also found with severe hyperglycemia and severe hypernatremia without frank DKA   A/p 1)-Sepsis secondary to Bilateral Pneumonia--suspect aspiration related in  the patient with dysphagia--- has penicillin allergy -Pharmacy consult appreciated - C/n Cefepime and Flagyl pending cultures -c/n IVF -Patient met sepsis criteria with transient hypotension requiring aggressive IV fluid boluses, tachypnea, hypoxia, leukocytosis and, reports of fever -Continue to Hold PTA labetalol due to transient hypotension in the setting of sepsis -COVID-19 test Neg  2)Bilateral Pneumonia--- suspect aspiration related,  antibiotics as above #1 -c/n bronchodilators and mucolytics -Significant hypoxia persist -WBC is down to 11.1 from 13.3   3) acute hypoxic respiratory failure--- secondary to #2 above, treat as above #1 and #2, -Continue high flow oxygen supplementation, -use  BiPAP qhs and prn   4)New onset DM severe hyperglycemia without frank DKA--blood glucose was 689, bicarb 32, anion gap range between 8 and 11 -A1c 11.3 Okay to decrease Lantus to 6  units daily as glycemic control is improved , along with sliding scale coverage -Continue IV fluids  5)HyperNatremia--sodium is 158,  - due to dehydration and free water deficit, continue half-normal saline - suspect significant free water deficit in the patient with dysphagia who is on honey thickened liquids and now with severe hyperglycemia and sepsis-- monitor serial BMP and adjust IV fluid rate and composition as indicated  6)AKI----acute kidney injury due to severe dehydration in the setting of sepsis with transient hypotension as well as significant dehydration and volume depletion in  a patient who is on honey thickened liquids-     creatinine on admission=2.85  ,   baseline creatinine = 1.0 ( 6 months PTA ), creatinine is down to 1.61 with hydration,  renally adjust medications, avoid nephrotoxic agents/dehydration/hypotension -Continue to Hold  Diovan and HCTZ  7)History of prior stroke/DVT/PE--- continue Eliquis  8)Dysphagia--- patient with prior stroke with right hemiparesis and Dysphagia, PTA was on honey thickened liquids.  N.p.o. for now due to respiratory distress requiring high flow oxygen --Patient with dehydration, AKI  with severe hypernatremia due to free water deficit in the setting of honey thickened liquid/dietary limitations -Patient also having aspiration pneumonia -Patient's brother [name] wants to talk with patient's sister and decide if patient should have a PEG/G-tube--so he can have free water administered and reduce risk for AKI/dehydration/hypernatremia  9)Social/Ethics- Patient is a poor historian due to dysarthria from prior stroke  -Discussed with patient's brother/POA -Mr Calhoun Reichardt at [phone] to obtain additional history -Plan of care and advanced directive discussed, patient is a full code  10)H/o Prior CVA with residual right sided hemiparesis and Dysphagia--- continue Eliquis and Lipitor  11)Hyperthyroidism: -Continue methimazole, hold labetalol  12)History of recurrent sigmoid volvulus and chronic constipation--CT abdomen and pelvis without evidence of volvulus or SBO at this time, laxatives as ordered  13) Enterococcus faecalis UTI--treat empirically with Macrobid, watch renal function closely  Disposition/Need for in-Hospital Stay- patient unable to be discharged at this time due to -sepsis in the setting of bilateral pneumonia requiring IV antibiotics and IV fluids with acute hypoxic respiratory failure requiring high flow oxygen  Code Status : Full Code  Family Communication:  Discussed with patient's brother/POA -Mr Micah Barnier at [phone]--[id number] times during this hospitalization  Disposition Plan  : TBD  Consults  :  na  DVT Prophylaxis  :  Eliquis - SCDs   Lab Results  Component Value Date   PLT 210 05/11/2019    Inpatient Medications  Scheduled Meds:  apixaban   5 mg Oral BID   atorvastatin  10 mg Oral Daily   bisacodyl  10 mg Oral Once   bisacodyl  10 mg Rectal QHS   bisacodyl  10 mg Rectal Once   Chlorhexidine Gluconate Cloth  6 each Topical Daily   insulin aspart  0-5 Units Subcutaneous QHS   insulin aspart  0-6 Units Subcutaneous TID WC   insulin aspart  3 Units Subcutaneous TID WC   insulin glargine  6 Units Subcutaneous Daily   [START ON 05/15/2019] ipratropium-albuterol  3 mL Nebulization TID   loratadine  10 mg Oral Daily   methimazole  5 mg Oral Daily   mometasone-formoterol  2 puff Inhalation BID   nitrofurantoin (macrocrystal-monohydrate)  100 mg Oral Q12H   polyethylene glycol  17 g Oral BID   polyvinyl alcohol  1 drop Right Eye TID   senna-docusate  2 tablet Oral BID   sodium chloride flush  3 mL Intravenous Q12H   Continuous Infusions:  sodium chloride 125 mL/hr at 05/11/19 1000   sodium chloride     ceFEPime (MAXIPIME) IV 2 g (05/11/19 1006)   metronidazole Stopped (05/11/19 0543)   PRN Meds:.sodium chloride, acetaminophen **OR** acetaminophen, albuterol, dextrose, ondansetron **OR** ondansetron (ZOFRAN) IV, sodium chloride flush, traZODone   Anti-infectives (From admission, onward)   Start     Dose/Rate Route Frequency Ordered Stop   05/11/19 1600  vancomycin (VANCOCIN) 1,500 mg in sodium chloride 0.9 % 500 mL IVPB  Status:  Discontinued  1,500 mg 250 mL/hr over 120 Minutes Intravenous Every 48 hours 05/09/19 1704 05/10/19 0809   05/11/19 1130  nitrofurantoin (macrocrystal-monohydrate) (MACROBID) capsule 100 mg     100 mg Oral Every 12 hours 05/11/19 1125 05/18/19 0959   05/10/19 1400  ceFEPIme (MAXIPIME) 2 g in sodium chloride 0.9 % 100 mL IVPB  Status:  Discontinued     2 g 200 mL/hr over 30 Minutes Intravenous Every 24 hours 05/09/19 1704 05/10/19 0813   05/10/19 1000  ceFEPIme (MAXIPIME) 2 g in sodium chloride 0.9 % 100 mL IVPB     2 g 200 mL/hr over 30 Minutes Intravenous Every 12 hours  05/10/19 0813     05/09/19 1900  metroNIDAZOLE (FLAGYL) IVPB 500 mg     500 mg 100 mL/hr over 60 Minutes Intravenous Every 8 hours 05/09/19 1842     05/09/19 1400  vancomycin (VANCOCIN) IVPB 1000 mg/200 mL premix     1,000 mg 200 mL/hr over 60 Minutes Intravenous Every 1 hr x 2 05/09/19 1302 05/09/19 1643   05/09/19 1300  vancomycin (VANCOCIN) IVPB 1000 mg/200 mL premix  Status:  Discontinued     1,000 mg 200 mL/hr over 60 Minutes Intravenous  Once 05/09/19 1256 05/09/19 1302   05/09/19 1300  ceFEPIme (MAXIPIME) 2 g in sodium chloride 0.9 % 100 mL IVPB     2 g 200 mL/hr over 30 Minutes Intravenous  Once 05/09/19 1256 05/09/19 1349        Objective:   Vitals:   05/11/19 0754 05/11/19 0800 05/11/19 0900 05/11/19 1000  BP:  113/80 106/72 112/77  Pulse:  88 (!) 108 91  Resp:  14 (!) 21 18  Temp: 98.4 F (36.9 C)     TempSrc: Oral     SpO2:  94% (!) 86% (!) 89%  Weight:      Height:        Wt Readings from Last 3 Encounters:  05/11/19 106.8 kg  05/08/19 106.4 kg  05/07/19 106.4 kg     Intake/Output Summary (Last 24 hours) at 05/11/2019 1134 Last data filed at 05/11/2019 1000 Gross per 24 hour  Intake 2740.46 ml  Output 2100 ml  Net 640.46 ml   Physical Exam  Gen:- Awake Alert, appears comfortable HEENT:- [place].AT, No sclera icterus  Nose- HF [place]-Supple Neck,No JVD,.  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, no new focal deficits, no tremors   Data Review:   Micro Results Recent Results (from the past 240 hour(s))  Culture, blood (routine x 2)     Status: None   Collection Time: 05/05/19  4:00 AM   Specimen: BLOOD RIGHT HAND  Result Value Ref Range  Status   Specimen Description BLOOD RIGHT HAND  Final   Special Requests   Final    BOTTLES DRAWN AEROBIC AND ANAEROBIC Blood Culture results may not be optimal due to an excessive volume of blood received in culture bottles   Culture   Final    NO GROWTH 5 DAYS Performed at [facility], [address] [place], [place] [address]    Report Status 05/10/2019 FINAL  Final  Culture, blood (routine x 2)     Status: None   Collection Time: 05/05/19  4:00  AM   Specimen: BLOOD LEFT HAND  Result Value Ref Range Status   Specimen Description BLOOD LEFT HAND  Final   Special Requests   Final    BOTTLES DRAWN AEROBIC AND ANAEROBIC Blood Culture adequate volume   Culture   Final    NO GROWTH 5 DAYS Performed at [facility], [address], [place], [place] [address]    Report Status 05/10/2019 FINAL  Final  Urine culture     Status: Abnormal   Collection Time: 05/09/19 12:55 PM   Specimen: In/Out Cath Urine  Result Value Ref Range Status   Specimen Description   Final    IN/OUT CATH URINE Performed at [facility], [address], [place], [place] [address]    Special Requests   Final    NONE Performed at [facility], [address], [place], [place] [address]    Culture >=100,000 COLONIES/mL ENTEROCOCCUS FAECALIS (A)  Final   Report Status 05/11/2019 FINAL  Final   Organism ID, Bacteria ENTEROCOCCUS FAECALIS (A)  Final      Susceptibility   Enterococcus faecalis - MIC*    AMPICILLIN <=2 SENSITIVE Sensitive     LEVOFLOXACIN >=8 RESISTANT Resistant     NITROFURANTOIN <=16 SENSITIVE Sensitive     VANCOMYCIN 1 SENSITIVE Sensitive     * >=100,000 COLONIES/mL ENTEROCOCCUS FAECALIS  Blood Culture (routine x 2)     Status: None (Preliminary result)   Collection Time: 05/09/19  1:10 PM   Specimen: BLOOD RIGHT HAND  Result Value Ref Range Status   Specimen Description BLOOD RIGHT HAND  Final   Special Requests   Final    BOTTLES DRAWN AEROBIC AND ANAEROBIC Blood Culture adequate volume    Culture   Final    NO GROWTH 2 DAYS Performed at [facility], [address], [place], [place] [address]    Report Status PENDING  Incomplete  Blood Culture (routine x 2)     Status: None (Preliminary result)   Collection Time: 05/09/19  1:10 PM   Specimen: BLOOD LEFT HAND  Result Value Ref Range Status   Specimen Description BLOOD LEFT HAND  Final   Special Requests   Final    BOTTLES DRAWN AEROBIC AND ANAEROBIC Blood Culture adequate volume   Culture   Final    NO GROWTH 2 DAYS Performed at [facility], [address] [place],  [address]    Report Status PENDING  Incomplete  SARS CORONAVIRUS 2 (TAT 6-24 HRS) Nasopharyngeal Nasopharyngeal Swab     Status: None   Collection Time: 05/09/19  2:28 PM   Specimen: Nasopharyngeal Swab  Result Value Ref Range Status   SARS Coronavirus 2 NEGATIVE NEGATIVE Final    Comment: (NOTE) SARS-CoV-2 target nucleic acids are NOT DETECTED. The SARS-CoV-2 RNA is generally detectable in upper and lower respiratory specimens during the acute phase of infection. Negative results do not preclude SARS-CoV-2 infection, do not rule out co-infections with other pathogens, and should not be used as the sole basis for treatment or other patient management decisions. Negative results must be combined with clinical observations, patient history, and epidemiological information. The expected result is Negative. Fact Sheet for Patients: SugarRoll.be Fact Sheet for Healthcare Providers: https://www.woods-mathews.com/ This test is not yet approved or cleared by the [place] FDA and  has been authorized for detection and/or diagnosis of SARS-CoV-2 by FDA under an Emergency Use Authorization (EUA). This EUA will remain  in effect (meaning this test can be used) for the duration  of the COVID-19 declaration under Section 56 4(b)(1) of the Act, 21 U.S.C. section 360bbb-3(b)(1), unless the authorization is  terminated or revoked sooner. Performed at [facility], [place] [address] [place], [place] [address]   MRSA PCR Screening     Status: None   Collection Time: 05/09/19  8:47 PM   Specimen: Nasal Mucosa; Nasopharyngeal  Result Value Ref Range Status   MRSA by PCR NEGATIVE NEGATIVE Final    Comment:        The GeneXpert MRSA Assay (FDA approved for NASAL specimens only), is one component of a comprehensive MRSA colonization surveillance program. It is not intended to diagnose MRSA infection nor to guide or monitor treatment for MRSA infections. Performed at [facility], [address], [place], [place] [address]    Radiology Reports Ct Abdomen Pelvis Wo Contrast  Result Date: 05/09/2019 CLINICAL DATA:  Abdominal distension. Possible volvulus. EXAM: CT ABDOMEN AND PELVIS WITHOUT CONTRAST TECHNIQUE: Multidetector CT imaging of the abdomen and pelvis was performed following the standard protocol without IV contrast. COMPARISON:  11/03/2016 FINDINGS: Lower chest: Motion artifact through the lung bases with consolidative opacities in the lower lobes and to a lesser extent posterior lingula and right middle lobe. No pleural effusion. Coronary atherosclerosis. Hepatobiliary: 2 cm cyst inferiorly in the right hepatic lobe, mildly enlarged from 2018. Contracted gallbladder. No biliary dilatation. Pancreas: Unremarkable. Spleen: Unremarkable. Adrenals/Urinary Tract: Unremarkable adrenal glands. Unchanged small calcifications in both renal hila which may represent vascular calcifications or non-obstructing calculi. No hydroureteronephrosis. Unremarkable bladder. Stomach/Bowel: The stomach is collapsed. There is no small bowel dilatation. There is a moderately large amount of stool in the distal sigmoid colon and rectum. The more proximal sigmoid colon is redundant and moderately distended by gas. An ordinary amount of stool is present in the more proximal colon, which is nondilated. Left-sided  colonic diverticulosis is noted without evidence of diverticulitis. The appendix is unremarkable. Vascular/Lymphatic: Aortic atherosclerosis without aneurysm. No enlarged lymph nodes. Reproductive: Unremarkable prostate. Other: No ascites or pneumoperitoneum. Fat containing paraumbilical hernia. Musculoskeletal: No acute osseous abnormality or suspicious osseous lesion. Mild lumbar levoscoliosis. Advanced lumbar spondylosis with interbody and facet ankylosis at L4-5. IMPRESSION: 1. Moderately large amount of stool in the distal sigmoid colon and rectum. Moderate gaseous distension of the more proximal sigmoid colon without evidence of volvulus or bowel obstruction. 2. Bibasilar lung consolidation concerning for pneumonia. 3. Aortic Atherosclerosis (ICD10-I70.0). Electronically Signed   By: Logan Bores M.D.   On: 05/09/2019 15:45   Dg Chest Port 1 View  Result Date: 05/09/2019 CLINICAL DATA:  Shortness of breath.  Hypoxia. EXAM: PORTABLE CHEST 1 VIEW COMPARISON:  January 16, 2019. FINDINGS: Stable cardiomediastinal silhouette. No pneumothorax is noted. Hypoinflation of the lungs is noted. Mild bibasilar atelectasis or infiltrates are noted. Bony thorax is unremarkable. IMPRESSION: Mild bibasilar subsegmental atelectasis or infiltrates are noted. Electronically Signed   By: Marijo Conception M.D.   On: 05/09/2019 13:26    CBC Recent Labs  Lab 05/05/19 0400 05/09/19 1200 05/09/19 1310 05/10/19 0451 05/11/19 0408  WBC 12.3* 13.3* 12.1* 10.7* 11.1*  HGB 16.7 16.9 17.3* 15.7 15.0  HCT 53.4* 56.5* 58.4* 54.4* 52.3*  PLT 246 261 259 221 210  MCV 98.2 102.7* 103.0* 106.9* 107.2*  MCH 30.7 30.7 30.5 30.8 30.7  MCHC 31.3 29.9* 29.6* 28.9* 28.7*  RDW 13.7 14.1 14.2 14.3 14.3  LYMPHSABS 1.3 1.8 1.6  --   --   MONOABS 1.0 0.8 0.8  --   --  EOSABS 0.0 0.2 0.2  --   --   BASOSABS 0.0 0.0 0.0  --   --     Chemistries  Recent Labs  Lab 05/09/19 1310  05/09/19 2314 05/10/19 0451 05/10/19 1542  05/11/19 0013 05/11/19 0408  NA 152*   < > 159* 161* 160* 159* 158*  K 4.8   < > 3.9 4.6 4.3 3.8 3.8  CL 107   < > 123* 121* 123* 123* 120*  CO2 32   < > 31 32 33* 32 29  GLUCOSE 676*   < > 122* 169* 159* 108* 98  BUN 81*   < > 61* 56* 44* 37* 36*  CREATININE 2.76*   < > 2.28* 2.18* 1.90* 1.82* 1.61*  CALCIUM 9.1   < > 7.9* 8.2* 7.9* 7.8* 7.8*  AST 60*  --   --   --   --   --   --   ALT 69*  --   --   --   --   --   --   ALKPHOS 72  --   --   --   --   --   --   BILITOT 0.7  --   --   --   --   --   --    < > = values in this interval not displayed.   ------------------------------------------------------------------------------------------------------------------ No results for input(s): CHOL, HDL, LDLCALC, TRIG, CHOLHDL, LDLDIRECT in the last 72 hours.  Lab Results  Component Value Date   HGBA1C 11.3 (H) 05/09/2019   ------------------------------------------------------------------------------------------------------------------ No results for input(s): TSH, T4TOTAL, T3FREE, THYROIDAB in the last 72 hours.  Invalid input(s): FREET3 ------------------------------------------------------------------------------------------------------------------ No results for input(s): VITAMINB12, FOLATE, FERRITIN, TIBC, IRON, RETICCTPCT in the last 72 hours.  Coagulation profile Recent Labs  Lab 05/09/19 1310  INR 1.9*    No results for input(s): DDIMER in the last 72 hours.  Cardiac Enzymes No results for input(s): CKMB, TROPONINI, MYOGLOBIN in the last 168 hours.  Invalid input(s): CK ------------------------------------------------------------------------------------------------------------------    Component Value Date/Time   BNP 18.0 02/21/2017 0730    Wilmoth Rasnic M.D on 05/11/2019 at 11:34 AM  Go to [email or web address] - for contact info  [facility] - Office  [phone]

## 2019-05-12 LAB — BASIC METABOLIC PANEL
Anion gap: 6 (ref 5–15)
BUN: 28 mg/dL — ABNORMAL HIGH (ref 8–23)
CO2: 27 mmol/L (ref 22–32)
Calcium: 7.7 mg/dL — ABNORMAL LOW (ref 8.9–10.3)
Chloride: 122 mmol/L — ABNORMAL HIGH (ref 98–111)
Creatinine, Ser: 1.37 mg/dL — ABNORMAL HIGH (ref 0.61–1.24)
GFR calc Af Amer: 59 mL/min — ABNORMAL LOW (ref >60)
GFR calc non Af Amer: 51 mL/min — ABNORMAL LOW (ref >60)
Glucose, Bld: 132 mg/dL — ABNORMAL HIGH (ref 70–99)
Potassium: 3.8 mmol/L (ref 3.5–5.1)
Sodium: 155 mmol/L — ABNORMAL HIGH (ref 135–145)

## 2019-05-12 LAB — RENAL FUNCTION PANEL
Albumin: 2.4 g/dL — ABNORMAL LOW (ref 3.5–5.0)
Anion gap: 8 (ref 5–15)
BUN: 32 mg/dL — ABNORMAL HIGH (ref 8–23)
CO2: 28 mmol/L (ref 22–32)
Calcium: 7.9 mg/dL — ABNORMAL LOW (ref 8.9–10.3)
Chloride: 121 mmol/L — ABNORMAL HIGH (ref 98–111)
Creatinine, Ser: 1.5 mg/dL — ABNORMAL HIGH (ref 0.61–1.24)
GFR calc Af Amer: 53 mL/min — ABNORMAL LOW (ref >60)
GFR calc non Af Amer: 46 mL/min — ABNORMAL LOW (ref >60)
Glucose, Bld: 144 mg/dL — ABNORMAL HIGH (ref 70–99)
Phosphorus: 2 mg/dL — ABNORMAL LOW (ref 2.5–4.6)
Potassium: 3.8 mmol/L (ref 3.5–5.1)
Sodium: 157 mmol/L — ABNORMAL HIGH (ref 135–145)

## 2019-05-12 MED ORDER — DEXTROSE 5 % IV SOLN
INTRAVENOUS | Status: DC
  Administered 2019-05-12 – 2019-05-15 (×5): via INTRAVENOUS

## 2019-05-12 NOTE — Progress Notes (Signed)
°                                         ° ° Patient Demographics:  ° ° [name] [name], is a 73 y.o. male, DOB - 12/09/1945, MRN:[id number] ° °Admit date - 05/09/2019   Admitting Physician  , MD ° °Outpatient Primary MD for the patient is Alexander, Anne D, MD ° °LOS - 3 ° ° °Chief Complaint  °Patient presents with  °• Respiratory Distress  °    ° ° Subjective:  ° ° [name] [name] today has  no emesis,  No chest pain,  ° °-No further fevers °-Had BM °-Cough and dyspnea improving °-Continues to require oxygen ° Assessment  & Plan :  ° ° Principal Problem: °  Sepsis secondary to pneumonia °Active Problems: °  Long term current use of anticoagulant therapy-on Eliquis due to history of DVT/PE and strokes °  Bil PNA (pneumonia)--suspect aspiration related °  Hypernatremia °  Acute hypoxic respiratory failure due to bilateral pneumonia °  Dysphagia due to old cerebrovascular accident-on honey thickened liquids °  AKI (acute kidney injury) (HCC) °  Enterococcus faecalis UTI infection °  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, as well as history of hyperthyroidism, HTN and recurrent episodes of sigmoid volvulus admitted on 05/09/2019 from [facility] with hypoxia requiring nonrebreather bag, patient has been unwell for the last couple of days with worsening respiratory status and reported fevers-imaging studies suggest bilateral pneumonia probably aspiration related, also found with severe hyperglycemia  and severe hypernatremia without frank DKA ° ° °A/p °1)-Sepsis secondary to Bilateral Pneumonia--suspect aspiration related in  the patient with dysphagia--- has penicillin allergy °-Pharmacy consult appreciated °- C/n Cefepime and Flagyl  °-c/n IVF °-Patient met sepsis criteria on admission with transient hypotension requiring aggressive IV fluid boluses, tachypnea, hypoxia, leukocytosis and, reports of fever °-Continue to Hold PTA labetalol due to transient hypotension in the setting of sepsis °-COVID-19 test Neg ° ° 2)Bilateral Pneumonia--- suspect aspiration related,  antibiotics as above #1 °-c/n bronchodilators and mucolytics °-Significant hypoxia persist °-WBC is down to 11.1 from 13.3 °  °3)Acute Hypoxic Respiratory Failure--- secondary to #2 above, treat as above #1 and #2, °-Continue high flow oxygen supplementation, °-use  BiPAP qhs and prn   ° °4)New onset DM severe hyperglycemia without frank DKA--  °--A1c 11.3 °-Glycemic control continues to improve, °C/n  Lantus 6  units daily as glycemic control is improved , along with sliding scale coverage °-Continue IV fluids °  °5)HyperNatremia--sodium is 157,  - due to dehydration and free water deficit, continue IVF with half-normal saline °- suspect significant free water deficit in the patient with dysphagia who is on honey thickened liquids and now with severe hyperglycemia and sepsis-- monitor serial BMP and adjust IV fluid rate and composition as indicated °-Plan for PEG tube placement to allow for free water administration °  °6)AKI----acute kidney injury due to severe dehydration in the setting of sepsis with transient hypotension as well as significant dehydration and volume depletion in   a patient who is on honey thickened liquids-     creatinine on admission=2.85  ,   baseline creatinine = 1.0 ( 6 months PTA ), creatinine is down to 1.50 with hydration,  renally adjust medications, avoid nephrotoxic agents/dehydration/hypotension °-Continue to Hold  Diovan and HCTZ °  °7)History of prior stroke/DVT/PE---  hold Eliquis needle to allow for PEG tube placement °  °8)Dysphagia--- patient with prior stroke with right hemiparesis and Dysphagia, PTA was on honey thickened liquids.  N.p.o. 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  °-Discussed with patient's brother/POA -Mr [name] Toor at [phone]  °-Plan of care and advanced directive discussed, patient is a full code °  °10)H/o Prior CVA with residual right sided hemiparesis and Dysphagia--- hold Eliquis to allow for PEG tube placement -continue Lipitor °  °11)Hyperthyroidism: -Continue methimazole, hold labetalol °  °12)History of recurrent sigmoid volvulus and chronic constipation--CT abdomen and pelvis without evidence of volvulus or SBO at this time, laxatives as ordered °  °13) Enterococcus faecalis UTI--resistant to quinolones, patient is allergy to penicillins, treat empirically with Macrobid, watch renal function closely ° °Disposition/Need for in-Hospital Stay- patient unable to be discharged at this time due to -sepsis in the setting of bilateral pneumonia requiring IV antibiotics and IV fluids with acute hypoxic respiratory failure requiring high flow oxygen °-Awaiting PEG tube placement ° °Code Status : Full Code ° °Family Communication:  Discussed with patient's brother/POA -Mr [name] Lowenthal at [phone]--multiple times during this hospitalization ° °Disposition Plan  : TBD ° °Consults  :  na ° °DVT Prophylaxis  : Eliquis on hold-  SCDs  ° °Lab Results  °Component Value Date  ° PLT 210 05/11/2019  ° ° °Inpatient Medications ° °Scheduled Meds: °• atorvastatin  10 mg Oral Daily  °• bisacodyl  10 mg Rectal QHS  °• Chlorhexidine Gluconate Cloth  6 each Topical Daily  °• insulin aspart  0-5 Units Subcutaneous QHS  °• insulin aspart  0-6 Units Subcutaneous TID WC  °• insulin aspart  3 Units Subcutaneous TID WC  °• insulin glargine  6 Units Subcutaneous Daily  °• [START ON 05/15/2019] ipratropium-albuterol  3 mL Nebulization TID  °• loratadine  10 mg Oral Daily  °• methimazole  5 mg Oral Daily  °• mometasone-formoterol  2 puff Inhalation BID  °• nitrofurantoin (macrocrystal-monohydrate)  100 mg Oral Q12H  °• polyethylene glycol  17 g Oral BID  °• polyvinyl alcohol  1 drop Right Eye TID  °• senna-docusate  2 tablet Oral BID  °• sodium chloride flush  3 mL Intravenous Q12H  ° °Continuous Infusions: °• sodium chloride 100 mL/hr at 05/12/19 1022  °• sodium chloride    °• ceFEPime (MAXIPIME) IV 2 g (05/12/19 1026)  °• metronidazole 500 mg (05/12/19 0326)  ° °PRN Meds:.sodium chloride, acetaminophen **OR** acetaminophen, albuterol, dextrose, ondansetron **OR** ondansetron (ZOFRAN) IV, sodium chloride flush, traZODone ° ° °Anti-infectives (From admission, onward)  ° Start     Dose/Rate Route Frequency Ordered Stop  ° 05/11/19 1600  vancomycin (VANCOCIN) 1,500 mg in sodium chloride 0.9 % 500 mL IVPB  Status:  Discontinued    °   admission, onward)   Start     Dose/Rate Route Frequency Ordered Stop   05/11/19 1600  vancomycin (VANCOCIN) 1,500 mg in sodium chloride 0.9 % 500 mL IVPB  Status:  Discontinued     1,500 mg 250 mL/hr over 120 Minutes Intravenous Every 48 hours 05/09/19 1704 05/10/19 0809   05/11/19 1130  nitrofurantoin (macrocrystal-monohydrate) (MACROBID) capsule 100 mg     100 mg Oral Every 12 hours 05/11/19 1125 05/18/19 0959   05/10/19 1400  ceFEPIme (MAXIPIME) 2 g in sodium chloride 0.9 % 100 mL IVPB  Status:  Discontinued     2 g 200 mL/hr over 30 Minutes Intravenous Every 24 hours 05/09/19 1704 05/10/19 0813   05/10/19 1000  ceFEPIme (MAXIPIME) 2 g in sodium chloride 0.9 % 100 mL IVPB     2 g 200 mL/hr over 30  Minutes Intravenous Every 12 hours 05/10/19 0813     05/09/19 1900  metroNIDAZOLE (FLAGYL) IVPB 500 mg     500 mg 100 mL/hr over 60 Minutes Intravenous Every 8 hours 05/09/19 1842     05/09/19 1400  vancomycin (VANCOCIN) IVPB 1000 mg/200 mL premix     1,000 mg 200 mL/hr over 60 Minutes Intravenous Every 1 hr x 2 05/09/19 1302 05/09/19 1643   05/09/19 1300  vancomycin (VANCOCIN) IVPB 1000 mg/200 mL premix  Status:  Discontinued     1,000 mg 200 mL/hr over 60 Minutes Intravenous  Once 05/09/19 1256 05/09/19 1302   05/09/19 1300  ceFEPIme (MAXIPIME) 2 g in sodium chloride 0.9 % 100 mL IVPB     2 g 200 mL/hr over 30 Minutes Intravenous  Once 05/09/19 1256 05/09/19 1349        Objective:   Vitals:   05/12/19 0600 05/12/19 0755 05/12/19 0802 05/12/19 0806  BP: (!) 137/59     Pulse: 80     Resp: 16     Temp:  (!) 97.3 F (36.3 C)    TempSrc:  Oral    SpO2: 92%  97% 93%  Weight:      Height:        Wt Readings from Last 3 Encounters:  05/11/19 106.8 kg  05/08/19 106.4 kg  05/07/19 106.4 kg     Intake/Output Summary (Last 24 hours) at 05/12/2019 1114 Last data filed at 05/12/2019 0416 Gross per 24 hour  Intake 1999.84 ml  Output 1075 ml  Net 924.84 ml   Physical Exam  Gen:- Awake Alert, appears comfortable HEENT:- [place].AT, No sclera icterus  Nose- HF Nasal canula Neck-Supple Neck,No JVD,.  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, no new focal deficits, no tremors   Data Review:   Micro Results Recent Results (from the past 240 hour(s))  Culture, blood (routine x 2)     Status: None   Collection Time: 05/05/19  4:00 AM   Specimen: BLOOD RIGHT HAND  Result  Value Ref Range Status   Specimen Description BLOOD RIGHT HAND  Final   Special Requests   Final    BOTTLES DRAWN AEROBIC AND ANAEROBIC Blood Culture results may not be optimal due to an excessive volume of blood received in culture bottles   Culture   Final    NO GROWTH 5 DAYS Performed  at [facility], [address] [place], [place] [address]    Report Status 05/10/2019 FINAL  Final  Culture, blood (routine x 2)     Status: None   Collection Time: 05/05/19  4:00 AM   Specimen: BLOOD LEFT HAND  Result Value Ref Range Status   Specimen Description BLOOD LEFT HAND  Final   Special Requests   Final    BOTTLES DRAWN AEROBIC AND ANAEROBIC Blood Culture adequate volume   Culture   Final    NO GROWTH 5 DAYS Performed at [facility], [address] [place], [place] [address]    Report Status 05/10/2019 FINAL  Final  Urine culture     Status: Abnormal   Collection Time: 05/09/19 12:55 PM   Specimen: In/Out Cath Urine  Result Value Ref Range Status   Specimen Description   Final    IN/OUT CATH URINE Performed at [facility], [address], [place], [place] [address]    Special Requests   Final    NONE Performed at [facility], [address], [place], [place] [address]    Culture >=100,000 COLONIES/mL ENTEROCOCCUS FAECALIS (A)  Final   Report Status 05/11/2019 FINAL  Final   Organism ID, Bacteria ENTEROCOCCUS FAECALIS (A)  Final      Susceptibility   Enterococcus faecalis - MIC*    AMPICILLIN <=2 SENSITIVE Sensitive     LEVOFLOXACIN >=8 RESISTANT Resistant     NITROFURANTOIN <=16 SENSITIVE Sensitive     VANCOMYCIN 1 SENSITIVE Sensitive     * >=100,000 COLONIES/mL ENTEROCOCCUS FAECALIS  Blood Culture (routine x 2)     Status: None (Preliminary result)   Collection Time: 05/09/19  1:10 PM   Specimen: BLOOD RIGHT HAND  Result Value Ref Range Status   Specimen Description BLOOD RIGHT HAND  Final   Special Requests   Final    BOTTLES DRAWN AEROBIC AND ANAEROBIC Blood Culture  adequate volume   Culture   Final    NO GROWTH 3 DAYS Performed at [facility], [address] [place], [place] [address]    Report Status PENDING  Incomplete  Blood Culture (routine x 2)     Status: None (Preliminary result)   Collection Time: 05/09/19  1:10 PM   Specimen: BLOOD LEFT HAND  Result Value Ref Range Status   Specimen Description BLOOD LEFT HAND  Final   Special Requests   Final    BOTTLES DRAWN AEROBIC AND ANAEROBIC Blood Culture adequate volume   Culture   Final    NO GROWTH 3 DAYS Performed at [facility], [address], [place], [place] [address]    Report Status PENDING  Incomplete  SARS CORONAVIRUS 2 (TAT 6-24 HRS) Nasopharyngeal Nasopharyngeal Swab     Status: None   Collection Time: 05/09/19  2:28 PM   Specimen: Nasopharyngeal Swab  Result Value Ref Range Status   SARS Coronavirus 2 NEGATIVE NEGATIVE Final    Comment: (NOTE) SARS-CoV-2 target nucleic acids are NOT DETECTED. The SARS-CoV-2 RNA is generally detectable in upper and lower respiratory specimens during the acute phase of infection. Negative results do not preclude SARS-CoV-2 infection, do not rule out co-infections with other pathogens, and should not be used as the sole basis for treatment or other patient management decisions. Negative results must be combined with clinical observations, patient history, and epidemiological information. The expected result is Negative. Fact Sheet for Patients: SugarRoll.be Fact Sheet for Healthcare Providers: https://www.woods-mathews.com/ This test is not yet approved or cleared by the  declaration under Section 56 °4(b)(1) of the Act, 21 U.S.C. °section 360bbb-3(b)(1), unless the  authorization is terminated or °revoked sooner. °Performed at [facility], [address] [place], [place] °[address] °  °MRSA PCR Screening     Status: None  ° Collection Time: 05/09/19  8:47 PM  ° Specimen: Nasal Mucosa; Nasopharyngeal  °Result Value Ref Range Status  ° MRSA by PCR NEGATIVE NEGATIVE Final  °  Comment:        °The GeneXpert MRSA Assay (FDA °approved for NASAL specimens °only), is one component of a °comprehensive MRSA colonization °surveillance program. It is not °intended to diagnose MRSA °infection nor to guide or °monitor treatment for °MRSA infections. °Performed at [facility], [address] [place],  [address] °  ° °Radiology Reports °Ct Abdomen Pelvis Wo Contrast ° °Result Date: 05/09/2019 °CLINICAL DATA:  Abdominal distension. Possible volvulus. EXAM: CT ABDOMEN AND PELVIS WITHOUT CONTRAST TECHNIQUE: Multidetector CT imaging of the abdomen and pelvis was performed following the standard protocol without IV contrast. COMPARISON:  11/03/2016 FINDINGS: Lower chest: Motion artifact through the lung bases with consolidative opacities in the lower lobes and to a lesser extent posterior lingula and right middle lobe. No pleural effusion. Coronary atherosclerosis. Hepatobiliary: 2 cm cyst inferiorly in the right hepatic lobe, mildly enlarged from 2018. Contracted gallbladder. No biliary dilatation. Pancreas: Unremarkable. Spleen: Unremarkable. Adrenals/Urinary Tract: Unremarkable adrenal glands. Unchanged small calcifications in both renal hila which may represent vascular calcifications or non-obstructing calculi. No hydroureteronephrosis. Unremarkable bladder. Stomach/Bowel: The stomach is collapsed. There is no small bowel dilatation. There is a moderately large amount of stool in the distal sigmoid colon and rectum. The more proximal sigmoid colon is redundant and moderately distended by gas. An ordinary amount of stool is present in the more proximal colon, which is  nondilated. Left-sided colonic diverticulosis is noted without evidence of diverticulitis. The appendix is unremarkable. Vascular/Lymphatic: Aortic atherosclerosis without aneurysm. No enlarged lymph nodes. Reproductive: Unremarkable prostate. Other: No ascites or pneumoperitoneum. Fat containing paraumbilical hernia. Musculoskeletal: No acute osseous abnormality or suspicious osseous lesion. Mild lumbar levoscoliosis. Advanced lumbar spondylosis with interbody and facet ankylosis at L4-5. IMPRESSION: 1. Moderately large amount of stool in the distal sigmoid colon and rectum. Moderate gaseous distension of the more proximal sigmoid colon without evidence of volvulus or bowel obstruction. 2. Bibasilar lung consolidation concerning for pneumonia. 3. Aortic Atherosclerosis (ICD10-I70.0). Electronically Signed   By: Allen  Grady M.D.   On: 05/09/2019 15:45  ° °Dg Chest Port 1 View ° °Result Date: 05/09/2019 °CLINICAL DATA:  Shortness of breath.  Hypoxia. EXAM: PORTABLE CHEST 1 VIEW COMPARISON:  January 16, 2019. FINDINGS: Stable cardiomediastinal silhouette. No pneumothorax is noted. Hypoinflation of the lungs is noted. Mild bibasilar atelectasis or infiltrates are noted. Bony thorax is unremarkable. IMPRESSION: Mild bibasilar subsegmental atelectasis or infiltrates are noted. Electronically Signed   By: James  Green Jr M.D.   On: 05/09/2019 13:26  °  °CBC °Recent Labs  °Lab 05/09/19 °1200 05/09/19 °1310 05/10/19 °0451 05/11/19 °0408  °WBC 13.3* 12.1* 10.7* 11.1*  °HGB 16.9 17.3* 15.7 15.0  °HCT 56.5* 58.4* 54.4* 52.3*  °PLT 261 259 221 210  °MCV 102.7* 103.0* 106.9* 107.2*  °MCH 30.7 30.5 30.8 30.7  °MCHC 29.9* 29.6* 28.9* 28.7*  °RDW 14.1 14.2 14.3 14.3  °LYMPHSABS 1.8 1.6  --   --   °MONOABS 0.8 0.8  --   --   °EOSABS 0.2 0.2  --   --   °BASOSABS 0.0   0.0  --   --   ° ° °Chemistries  °Recent Labs  °Lab 05/09/19 °1310  05/10/19 °1542 05/11/19 °0013 05/11/19 °0408 05/11/19 °1601 05/12/19 °0441  °NA 152*   < > 160* 159*  158* 156* 157*  °K 4.8   < > 4.3 3.8 3.8 3.7 3.8  °CL 107   < > 123* 123* 120* 120* 121*  °CO2 32   < > 33* 32 29 28 28  °GLUCOSE 676*   < > 159* 108* 98 138* 144*  °BUN 81*   < > 44* 37* 36* 33* 32*  °CREATININE 2.76*   < > 1.90* 1.82* 1.61* 1.81* 1.50*  °CALCIUM 9.1   < > 7.9* 7.8* 7.8* 7.8* 7.9*  °AST 60*  --   --   --   --   --   --   °ALT 69*  --   --   --   --   --   --   °ALKPHOS 72  --   --   --   --   --   --   °BILITOT 0.7  --   --   --   --   --   --   ° < > = values in this interval not displayed.  ° °------------------------------------------------------------------------------------------------------------------ °No results for input(s): CHOL, HDL, LDLCALC, TRIG, CHOLHDL, LDLDIRECT in the last 72 hours. ° °Lab Results  °Component Value Date  ° HGBA1C 11.3 (H) 05/09/2019  ° °------------------------------------------------------------------------------------------------------------------ °No results for input(s): TSH, T4TOTAL, T3FREE, THYROIDAB in the last 72 hours. ° °Invalid input(s): FREET3 °------------------------------------------------------------------------------------------------------------------ °No results for input(s): VITAMINB12, FOLATE, FERRITIN, TIBC, IRON, RETICCTPCT in the last 72 hours. ° °Coagulation profile °Recent Labs  °Lab 05/09/19 °1310  °INR 1.9*  ° ° °No results for input(s): DDIMER in the last 72 hours. ° °Cardiac Enzymes °No results for input(s): CKMB, TROPONINI, MYOGLOBIN in the last 168 hours. ° °Invalid input(s): CK  ° °------------------------------------------------------------------------------------------------------------------ °   °Component Value Date/Time  ° BNP 18.0 02/21/2017 0730  ° ° °  M.D on 05/12/2019 at 11:14 AM ° °Go to [email or web address] - for contact info ° °[facility] - Office  [phone] ° ° ° ° °  ° °

## 2019-05-12 NOTE — Progress Notes (Signed)
Pharmacy Antibiotic Note  [name] [name] is a 73 y.o. male admitted on 05/09/2019 with pneumonia.  Pharmacy has been consulted for  cefepime dosing.  Plan: Continue Cefepime 2gm IV q12h F/U cxs and clinical progress Monitor V/S, labs   Height: 5\' 8"  (172.7 cm) Weight: 235 lb 7.2 oz (106.8 kg) IBW/kg (Calculated) : 68.4  Temp (24hrs), Avg:98 F (36.7 C), Min:97.3 F (36.3 C), Max:98.3 F (36.8 C)  Recent Labs  Lab 05/09/19 1200 05/09/19 1310 05/09/19 1458 05/09/19 1826  05/10/19 0451 05/10/19 1542 05/11/19 0013 05/11/19 0408 05/11/19 1601 05/12/19 0441  WBC 13.3* 12.1*  --   --   --  10.7*  --   --  11.1*  --   --   CREATININE 2.78* 2.76* 2.85* 2.71*   < > 2.18* 1.90* 1.82* 1.61* 1.81* 1.50*  LATICACIDVEN  --  2.1* 2.4* 2.5*  --  2.0*  --   --   --   --   --    < > = values in this interval not displayed.    Estimated Creatinine Clearance: 52 mL/min (A) (by C-G formula based on SCr of 1.5 mg/dL (H)).   Normalized CrCl is 58mls/min  Allergies  Allergen Reactions  . Penicillins     Has patient had a PCN reaction causing immediate rash, facial/tongue/throat swelling, SOB or lightheadedness with hypotension: unknown Has patient had a PCN reaction causing severe rash involving mucus membranes or skin necrosis: unknown Has patient had a PCN reaction that required hospitalization: unknown Has patient had a PCN reaction occurring within the last 10 years: unknown If all of the above answers are "NO", then may proceed with Cephalosporin use. 5/3 Tolerated Cefepime x 1 dose in ED.     Antimicrobials this admission: Vancomycin 11/18>>11/18 Cefepime 11/18 >>  Flagyl 11/18>> Macrobid 11/20 >> 11/27  Microbiology results: 11/18 BCx: ngtd 11/18 UCx: enterococcus faecalis  MRSA PCR: neg  Thank you for allowing pharmacy to be a part of this patient's care.  Margot Ables, PharmD Clinical Pharmacist 05/12/2019 1:35 PM

## 2019-05-12 NOTE — Progress Notes (Signed)
Patient is still aspirating as he swallowed a little water while rinsing his mouth after MDI  Dulera and became choked. He does have a strong cough. He is a pleasure to work with.

## 2019-05-13 ENCOUNTER — Inpatient Hospital Stay (HOSPITAL_COMMUNITY): Payer: Medicare Other

## 2019-05-13 ENCOUNTER — Encounter: Payer: Self-pay | Admitting: Internal Medicine

## 2019-05-13 LAB — RENAL FUNCTION PANEL
Albumin: 2.4 g/dL — ABNORMAL LOW (ref 3.5–5.0)
Anion gap: 7 (ref 5–15)
BUN: 25 mg/dL — ABNORMAL HIGH (ref 8–23)
CO2: 26 mmol/L (ref 22–32)
Calcium: 7.7 mg/dL — ABNORMAL LOW (ref 8.9–10.3)
Chloride: 119 mmol/L — ABNORMAL HIGH (ref 98–111)
Creatinine, Ser: 1.35 mg/dL — ABNORMAL HIGH (ref 0.61–1.24)
GFR calc Af Amer: 60 mL/min — ABNORMAL LOW (ref >60)
GFR calc non Af Amer: 52 mL/min — ABNORMAL LOW (ref >60)
Glucose, Bld: 205 mg/dL — ABNORMAL HIGH (ref 70–99)
Phosphorus: 1.7 mg/dL — ABNORMAL LOW (ref 2.5–4.6)
Potassium: 3.5 mmol/L (ref 3.5–5.1)
Sodium: 152 mmol/L — ABNORMAL HIGH (ref 135–145)

## 2019-05-13 LAB — CBC
HCT: 48.2 % (ref 39.0–52.0)
Hemoglobin: 14.2 g/dL (ref 13.0–17.0)
MCH: 30.8 pg (ref 26.0–34.0)
MCHC: 29.5 g/dL — ABNORMAL LOW (ref 30.0–36.0)
MCV: 104.6 fL — ABNORMAL HIGH (ref 80.0–100.0)
Platelets: 166 10*3/uL (ref 150–400)
RBC: 4.61 MIL/uL (ref 4.22–5.81)
RDW: 14 % (ref 11.5–15.5)
WBC: 8.6 10*3/uL (ref 4.0–10.5)
nRBC: 0 % (ref 0.0–0.2)

## 2019-05-13 MED ORDER — IPRATROPIUM-ALBUTEROL 0.5-2.5 (3) MG/3ML IN SOLN
3.0000 mL | Freq: Three times a day (TID) | RESPIRATORY_TRACT | Status: DC
  Administered 2019-05-14 – 2019-05-23 (×28): 3 mL via RESPIRATORY_TRACT
  Filled 2019-05-13 (×25): qty 3

## 2019-05-13 MED ORDER — ORAL CARE MOUTH RINSE
15.0000 mL | Freq: Two times a day (BID) | OROMUCOSAL | Status: DC
  Administered 2019-05-13 – 2019-05-15 (×5): 15 mL via OROMUCOSAL

## 2019-05-13 MED ORDER — HEPARIN SODIUM (PORCINE) 5000 UNIT/ML IJ SOLN
5000.0000 [IU] | Freq: Three times a day (TID) | INTRAMUSCULAR | Status: DC
  Administered 2019-05-13 – 2019-05-16 (×8): 5000 [IU] via SUBCUTANEOUS
  Filled 2019-05-13 (×8): qty 1

## 2019-05-13 MED ORDER — STARCH (THICKENING) PO POWD
ORAL | Status: DC | PRN
  Filled 2019-05-13: qty 227

## 2019-05-13 NOTE — Progress Notes (Signed)
Patient Demographics:    [name] [name], is a 73 y.o. male, DOB - [birth year], LEX:[id number]  Admit date - 05/09/2019   Admitting Physician Mikita Lesmeister Denton Brick, MD  Outpatient Primary MD for the patient is [name], MD  LOS - 4  Chief Complaint  Patient presents with  . Respiratory Distress      Subjective:    [name] [name] today has  no emesis,  No chest pain,   -Aspiration concerns persist, even with medications patient has choking episodes  Assessment  & Plan :    Principal Problem:   Sepsis secondary to pneumonia Active Problems:   Long term current use of anticoagulant therapy-on Eliquis due to history of DVT/PE and strokes   Bil PNA (pneumonia)--suspect aspiration related   Hypernatremia   Acute hypoxic respiratory failure due to bilateral pneumonia   Dysphagia due to old cerebrovascular accident-on honey thickened liquids   AKI (acute kidney injury) ([place])   Enterococcus faecalis UTI infection   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, as well as history of hyperthyroidism, HTN and recurrent episodes of sigmoid volvulus admitted on 05/09/2019 from [facility] with hypoxia requiring nonrebreather bag, patient has been unwell for the last couple of days with worsening respiratory status and reported fevers-imaging studies suggest bilateral pneumonia probably aspiration related, also found with severe hyperglycemia and severe  hypernatremia without frank DKA -Continues to have issues with aspiration   A/p 1)-Sepsis secondary to Bilateral Pneumonia--suspect aspiration related in  the patient with dysphagia--- has penicillin allergy -Pharmacy consult appreciated - C/n Cefepime and Flagyl  -c/n IVF -Patient met sepsis criteria on admission with transient hypotension requiring aggressive IV fluid boluses, tachypnea, hypoxia, leukocytosis and, reports of fever -Continue to Hold PTA labetalol due to transient hypotension in the setting of sepsis -COVID-19 test Neg  2)Bilateral Pneumonia--- suspect aspiration related,  antibiotics as above #1 -c/n bronchodilators and mucolytics -Significant hypoxia persist -WBC is down to 8.6 from 13.3  3)Acute Hypoxic Respiratory Failure--- secondary to #2 above, treat as above #1 and #2, -Continue high flow oxygen supplementation, -use  BiPAP qhs and prn   4)New onset DM severe hyperglycemia without frank DKA--  --A1c 11.3 -Glycemic control continues to improve, Continue to hold Lantus for now as glycemic control is improved , along with sliding scale coverage -Continue IV fluids  5)HyperNatremia--sodium is down to 152 from 161,  - due to dehydration and free water deficit, continue IVF with half-normal saline - suspect significant free water deficit in the patient with dysphagia who is on honey thickened liquids and now with severe hyperglycemia and sepsis-- monitor serial BMP and adjust IV fluid rate and composition as indicated -Plan for PEG tube placement to allow for free water administration  6)AKI----acute kidney injury due to severe dehydration in the setting of sepsis with transient hypotension as well as significant dehydration and  volume depletion in a patient who is on honey thickened liquids-     creatinine on admission=2.85  ,   baseline creatinine = 1.0 ( 6 months PTA ), creatinine is down to 1.35 with hydration,  renally adjust medications, avoid nephrotoxic  agents/dehydration/hypotension -Continue to Hold Diovan and HCTZ  7)History of prior stroke/DVT/PE---  hold Eliquis for now to allow for PEG tube placement  8)Dysphagia--- patient with prior stroke with right hemiparesis and Dysphagia, PTA was on honey thickened liquids.  N.p.o. for now due to respiratory distress requiring high flow oxygen --Patient with dehydration, AKI  with severe hypernatremia due to free water deficit in the setting of honey thickened liquid/dietary limitations -Patient also having aspiration pneumonia -Patient and his POA/brother [name] have  decided to pursue  PEG/G-tube--so he can have free water administered and reduce risk for aspiration as well as AKI/dehydration/hypernatremia  9)Social/Ethics- Patient is a poor historian due to dysarthria from prior stroke  -Discussed with patient's brother/POA -Mr [name] [name] at [phone]  -Plan of care and advanced directive discussed, patient is a full code  10)H/o Prior CVA with residual right sided hemiparesis and Dysphagia--- hold Eliquis to allow for PEG tube placement -continue Lipitor  11)Hyperthyroidism: -Continue methimazole, hold labetalol  12)History of recurrent sigmoid volvulus and chronic constipation--CT abdomen and pelvis without evidence of volvulus or SBO at this time, laxatives as ordered  13) Enterococcus faecalis UTI--resistant to quinolones, patient is allergy to penicillins, treat empirically with Macrobid, watch renal function closely  Disposition/Need for in-Hospital Stay- patient unable to be discharged at this time due to -sepsis in the setting of bilateral pneumonia requiring IV antibiotics and IV fluids with acute hypoxic respiratory failure requiring high flow oxygen -Awaiting PEG tube placement  Code Status : Full Code  Family Communication:  Discussed with patient's brother/POA -Mr [name] [name] at [phone]--THYHOOIL times during this hospitalization  Disposition Plan  : TBD   Antibiotic therapy Vancomycin 11/18>>11/18 Cefepime 11/18 >>  Flagyl 11/18>> Macrobid 11/20 >> 11/27  Consults  :  na  DVT Prophylaxis  : Eliquis on hold- SCDs   Lab Results  Component Value Date   PLT 166 05/13/2019    Inpatient Medications  Scheduled Meds: . atorvastatin  10 mg Oral Daily  . bisacodyl  10 mg Rectal QHS  . Chlorhexidine Gluconate Cloth  6 each Topical Daily  . heparin injection (subcutaneous)  5,000 Units Subcutaneous Q8H  . insulin aspart  0-5 Units Subcutaneous QHS  . insulin aspart  0-6 Units Subcutaneous TID WC  . [START ON 05/15/2019] ipratropium-albuterol  3 mL Nebulization TID  . loratadine  10 mg Oral Daily  . mouth rinse  15 mL Mouth Rinse BID  . methimazole  5 mg Oral Daily  . mometasone-formoterol  2 puff Inhalation BID  . nitrofurantoin (macrocrystal-monohydrate)  100 mg Oral Q12H  . polyethylene glycol  17 g Oral BID  . polyvinyl alcohol  1 drop Right Eye TID  . senna-docusate  2 tablet Oral BID  . sodium chloride flush  3 mL Intravenous Q12H   Continuous Infusions: . sodium chloride 20 mL/hr at 05/12/19 1741  . sodium chloride    . ceFEPime (MAXIPIME) IV 2 g (05/13/19 0852)  . dextrose 75 mL/hr at 05/13/19 0650  . metronidazole 500 mg (05/13/19 1000)   PRN Meds:.sodium chloride, acetaminophen **OR** acetaminophen, albuterol, dextrose, food thickener, ondansetron **OR** ondansetron (ZOFRAN) IV, sodium chloride flush, traZODone   Anti-infectives (From admission, onward)   Start  Dose/Rate Route Frequency Ordered Stop   05/11/19 1600  vancomycin (VANCOCIN) 1,500 mg in sodium chloride 0.9 % 500 mL IVPB  Status:  Discontinued     1,500 mg 250 mL/hr over 120 Minutes Intravenous Every 48 hours 05/09/19 1704 05/10/19 0809   05/11/19 1130  nitrofurantoin (macrocrystal-monohydrate) (MACROBID) capsule 100 mg     100 mg Oral Every 12 hours 05/11/19 1125 05/18/19 0959   05/10/19 1400  ceFEPIme (MAXIPIME) 2 g in sodium chloride 0.9 % 100 mL  IVPB  Status:  Discontinued     2 g 200 mL/hr over 30 Minutes Intravenous Every 24 hours 05/09/19 1704 05/10/19 0813   05/10/19 1000  ceFEPIme (MAXIPIME) 2 g in sodium chloride 0.9 % 100 mL IVPB     2 g 200 mL/hr over 30 Minutes Intravenous Every 12 hours 05/10/19 0813     05/09/19 1900  metroNIDAZOLE (FLAGYL) IVPB 500 mg     500 mg 100 mL/hr over 60 Minutes Intravenous Every 8 hours 05/09/19 1842     05/09/19 1400  vancomycin (VANCOCIN) IVPB 1000 mg/200 mL premix     1,000 mg 200 mL/hr over 60 Minutes Intravenous Every 1 hr x 2 05/09/19 1302 05/09/19 1643   05/09/19 1300  vancomycin (VANCOCIN) IVPB 1000 mg/200 mL premix  Status:  Discontinued     1,000 mg 200 mL/hr over 60 Minutes Intravenous  Once 05/09/19 1256 05/09/19 1302   05/09/19 1300  ceFEPIme (MAXIPIME) 2 g in sodium chloride 0.9 % 100 mL IVPB     2 g 200 mL/hr over 30 Minutes Intravenous  Once 05/09/19 1256 05/09/19 1349        Objective:   Vitals:   05/13/19 1000 05/13/19 1119 05/13/19 1200 05/13/19 1300  BP: (!) 149/64  (!) 143/71 (!) 145/64  Pulse: 79 79 77 76  Resp: 18 14 19 15   Temp:  98.1 F (36.7 C)    TempSrc:  Oral    SpO2: 93% 94% 92% 94%  Weight:      Height:        Wt Readings from Last 3 Encounters:  05/13/19 105.8 kg  05/13/19 106.1 kg  05/08/19 106.4 kg     Intake/Output Summary (Last 24 hours) at 05/13/2019 1535 Last data filed at 05/13/2019 1308 Gross per 24 hour  Intake 2150.37 ml  Output 1200 ml  Net 950.37 ml   Physical Exam  Gen:- Awake Alert, appears comfortable HEENT:- .AT, No sclera icterus  Nose- HF Nasal canula Neck-Supple Neck,No JVD,.  Lungs-diminished breath sounds, scattered wheezes and rhonchi  CV- S1, S2 normal, regular  Abd-  +ve B.Sounds, Abd Soft, No tenderness, soft umbilical hernia Extremity/Skin:- No  edema, pedal pulses present  Psych-affect is appropriate, oriented x3 Neuro-residual right-sided hemiparesis, residual facial droop with residual dysarthria  from prior stroke, no new focal deficits, no tremors   Data Review:   Micro Results Recent Results (from the past 240 hour(s))  Culture, blood (routine x 2)     Status: None   Collection Time: 05/05/19  4:00 AM   Specimen: BLOOD RIGHT HAND  Result Value Ref Range Status   Specimen Description BLOOD RIGHT HAND  Final   Special Requests   Final    BOTTLES DRAWN AEROBIC AND ANAEROBIC Blood Culture results may not be optimal due to an excessive volume of blood received in culture bottles   Culture   Final    NO GROWTH 5 DAYS Performed at [facility], [address], [place],  [place] [address]    Report Status 05/10/2019 FINAL  Final  Culture, blood (routine x 2)     Status: None   Collection Time: 05/05/19  4:00 AM   Specimen: BLOOD LEFT HAND  Result Value Ref Range Status   Specimen Description BLOOD LEFT HAND  Final   Special Requests   Final    BOTTLES DRAWN AEROBIC AND ANAEROBIC Blood Culture adequate volume   Culture   Final    NO GROWTH 5 DAYS Performed at [facility], [address], [place], [place] [address]    Report Status 05/10/2019 FINAL  Final  Urine culture     Status: Abnormal   Collection Time: 05/09/19 12:55 PM   Specimen: In/Out Cath Urine  Result Value Ref Range Status   Specimen Description   Final    IN/OUT CATH URINE Performed at [facility], [address], [place], [place] [address]    Special Requests   Final    NONE Performed at [facility], [address] [place], [place] [address]    Culture >=100,000 COLONIES/mL ENTEROCOCCUS FAECALIS (A)  Final   Report Status 05/11/2019 FINAL  Final   Organism ID, Bacteria ENTEROCOCCUS FAECALIS (A)  Final      Susceptibility   Enterococcus faecalis - MIC*    AMPICILLIN <=2 SENSITIVE Sensitive     LEVOFLOXACIN >=8 RESISTANT Resistant     NITROFURANTOIN <=16 SENSITIVE Sensitive     VANCOMYCIN 1 SENSITIVE Sensitive     * >=100,000 COLONIES/mL ENTEROCOCCUS FAECALIS  Blood Culture (routine x 2)     Status:  None (Preliminary result)   Collection Time: 05/09/19  1:10 PM   Specimen: BLOOD RIGHT HAND  Result Value Ref Range Status   Specimen Description BLOOD RIGHT HAND  Final   Special Requests   Final    BOTTLES DRAWN AEROBIC AND ANAEROBIC Blood Culture adequate volume   Culture   Final    NO GROWTH 3 DAYS Performed at [facility], [address], [place], [place] [address]    Report Status PENDING  Incomplete  Blood Culture (routine x 2)     Status: None (Preliminary result)   Collection Time: 05/09/19  1:10 PM   Specimen: BLOOD LEFT HAND  Result Value Ref Range Status   Specimen Description BLOOD LEFT HAND  Final   Special Requests   Final    BOTTLES DRAWN AEROBIC AND ANAEROBIC Blood Culture adequate volume   Culture   Final    NO GROWTH 3 DAYS Performed at [facility], [address] [place], [place] [address]    Report Status PENDING  Incomplete  SARS CORONAVIRUS 2 (TAT 6-24 HRS) Nasopharyngeal Nasopharyngeal Swab     Status: None   Collection Time: 05/09/19  2:28 PM   Specimen: Nasopharyngeal Swab  Result Value Ref Range Status   SARS Coronavirus 2 NEGATIVE NEGATIVE Final    Comment: (NOTE) SARS-CoV-2 target nucleic acids are NOT DETECTED. The SARS-CoV-2 RNA is generally detectable in upper and lower respiratory specimens during the acute phase of infection. Negative results do not preclude SARS-CoV-2 infection, do not rule out co-infections with other pathogens, and should not be used as the sole basis for treatment or other patient management decisions. Negative results must be combined with clinical observations, patient history, and epidemiological information. The expected result is Negative. Fact Sheet for Patients: SugarRoll.be Fact Sheet for Healthcare Providers: https://www.woods-mathews.com/ This test is not yet approved or cleared by the [place] and  has been authorized  for detection and/or diagnosis of  SARS-CoV-2 by FDA under an Emergency Use Authorization (EUA). This EUA will remain  in effect (meaning this test can be used) for the duration of the COVID-19 declaration under Section 56 4(b)(1) of the Act, 21 U.S.C. section 360bbb-3(b)(1), unless the authorization is terminated or revoked sooner. Performed at [facility], [address], [place], [place] [address]   MRSA PCR Screening     Status: None   Collection Time: 05/09/19  8:47 PM   Specimen: Nasal Mucosa; Nasopharyngeal  Result Value Ref Range Status   MRSA by PCR NEGATIVE NEGATIVE Final    Comment:        The GeneXpert MRSA Assay (FDA approved for NASAL specimens only), is one component of a comprehensive MRSA colonization surveillance program. It is not intended to diagnose MRSA infection nor to guide or monitor treatment for MRSA infections. Performed at [facility], [address], [place], [place] [address]    Radiology Reports Ct Abdomen Pelvis Wo Contrast  Result Date: 05/09/2019 CLINICAL DATA:  Abdominal distension. Possible volvulus. EXAM: CT ABDOMEN AND PELVIS WITHOUT CONTRAST TECHNIQUE: Multidetector CT imaging of the abdomen and pelvis was performed following the standard protocol without IV contrast. COMPARISON:  11/03/2016 FINDINGS: Lower chest: Motion artifact through the lung bases with consolidative opacities in the lower lobes and to a lesser extent posterior lingula and right middle lobe. No pleural effusion. Coronary atherosclerosis. Hepatobiliary: 2 cm cyst inferiorly in the right hepatic lobe, mildly enlarged from 2018. Contracted gallbladder. No biliary dilatation. Pancreas: Unremarkable. Spleen: Unremarkable. Adrenals/Urinary Tract: Unremarkable adrenal glands. Unchanged small calcifications in both renal hila which may represent vascular calcifications or non-obstructing calculi. No hydroureteronephrosis. Unremarkable bladder. Stomach/Bowel: The stomach is collapsed. There is no small bowel  dilatation. There is a moderately large amount of stool in the distal sigmoid colon and rectum. The more proximal sigmoid colon is redundant and moderately distended by gas. An ordinary amount of stool is present in the more proximal colon, which is nondilated. Left-sided colonic diverticulosis is noted without evidence of diverticulitis. The appendix is unremarkable. Vascular/Lymphatic: Aortic atherosclerosis without aneurysm. No enlarged lymph nodes. Reproductive: Unremarkable prostate. Other: No ascites or pneumoperitoneum. Fat containing paraumbilical hernia. Musculoskeletal: No acute osseous abnormality or suspicious osseous lesion. Mild lumbar levoscoliosis. Advanced lumbar spondylosis with interbody and facet ankylosis at L4-5. IMPRESSION: 1. Moderately large amount of stool in the distal sigmoid colon and rectum. Moderate gaseous distension of the more proximal sigmoid colon without evidence of volvulus or bowel obstruction. 2. Bibasilar lung consolidation concerning for pneumonia. 3. Aortic Atherosclerosis (ICD10-I70.0). Electronically Signed   By: Logan Bores M.D.   On: 05/09/2019 15:45   Dg Chest Port 1 View  Result Date: 05/13/2019 CLINICAL DATA:  Sepsis. EXAM: PORTABLE CHEST 1 VIEW COMPARISON:  05/09/2019 FINDINGS: 0528 hours. Lordotic position. Low lung volumes. The cardio pericardial silhouette is enlarged. There is pulmonary vascular congestion without overt pulmonary edema. Bibasilar collapse/consolidation again noted, left greater than right. IMPRESSION: Cardiomegaly with low lung volumes and bibasilar collapse/consolidation, left greater than right. Electronically Signed   By: Misty [place] M.D.   On: 05/13/2019 07:13   Dg Chest Port 1 View  Result Date: 05/09/2019 CLINICAL DATA:  Shortness of breath.  Hypoxia. EXAM: PORTABLE CHEST 1 VIEW COMPARISON:  January 16, 2019. FINDINGS: Stable cardiomediastinal silhouette. No pneumothorax is noted. Hypoinflation of the lungs is noted. Mild  bibasilar atelectasis or infiltrates are noted. Bony thorax is unremarkable. IMPRESSION: Mild bibasilar subsegmental atelectasis or  infiltrates are noted. Electronically Signed   By: Marijo Conception M.D.   On: 05/09/2019 13:26    CBC Recent Labs  Lab 05/09/19 1200 05/09/19 1310 05/10/19 0451 05/11/19 0408 05/13/19 0520  WBC 13.3* 12.1* 10.7* 11.1* 8.6  HGB 16.9 17.3* 15.7 15.0 14.2  HCT 56.5* 58.4* 54.4* 52.3* 48.2  PLT 261 259 221 210 166  MCV 102.7* 103.0* 106.9* 107.2* 104.6*  MCH 30.7 30.5 30.8 30.7 30.8  MCHC 29.9* 29.6* 28.9* 28.7* 29.5*  RDW 14.1 14.2 14.3 14.3 14.0  LYMPHSABS 1.8 1.6  --   --   --   MONOABS 0.8 0.8  --   --   --   EOSABS 0.2 0.2  --   --   --   BASOSABS 0.0 0.0  --   --   --     Chemistries  Recent Labs  Lab 05/09/19 1310  05/11/19 0408 05/11/19 1601 05/12/19 0441 05/12/19 1549 05/13/19 0520  NA 152*   < > 158* 156* 157* 155* 152*  K 4.8   < > 3.8 3.7 3.8 3.8 3.5  CL 107   < > 120* 120* 121* 122* 119*  CO2 32   < > 29 28 28 27 26   GLUCOSE 676*   < > 98 138* 144* 132* 205*  BUN 81*   < > 36* 33* 32* 28* 25*  CREATININE 2.76*   < > 1.61* 1.81* 1.50* 1.37* 1.35*  CALCIUM 9.1   < > 7.8* 7.8* 7.9* 7.7* 7.7*  AST 60*  --   --   --   --   --   --   ALT 69*  --   --   --   --   --   --   ALKPHOS 72  --   --   --   --   --   --   BILITOT 0.7  --   --   --   --   --   --    < > = values in this interval not displayed.   ------------------------------------------------------------------------------------------------------------------ No results for input(s): CHOL, HDL, LDLCALC, TRIG, CHOLHDL, LDLDIRECT in the last 72 hours.  Lab Results  Component Value Date   HGBA1C 11.3 (H) 05/09/2019   ------------------------------------------------------------------------------------------------------------------ No results for input(s): TSH, T4TOTAL, T3FREE, THYROIDAB in the last 72 hours.  Invalid input(s): FREET3  ------------------------------------------------------------------------------------------------------------------ No results for input(s): VITAMINB12, FOLATE, FERRITIN, TIBC, IRON, RETICCTPCT in the last 72 hours.  Coagulation profile Recent Labs  Lab 05/09/19 1310  INR 1.9*    No results for input(s): DDIMER in the last 72 hours.  Cardiac Enzymes No results for input(s): CKMB, TROPONINI, MYOGLOBIN in the last 168 hours.  Invalid input(s): CK   ------------------------------------------------------------------------------------------------------------------    Component Value Date/Time   BNP 18.0 02/21/2017 0730    Roxan Hockey M.D on 05/13/2019 at 3:35 PM  Go to [email or web address] - for contact info  [facility] - Office  [phone]

## 2019-05-13 NOTE — Progress Notes (Addendum)
Patient had a order for Nebulizer for duo neb due to start on 11/24 not sure as why then? Changed start date for tomorrow as he has been given neb today. He is doing well able to do 750 on incentive. Coughing up some secretions.at times which are thick. HFNC is 8 liters oxygen.

## 2019-05-13 NOTE — Progress Notes (Signed)
Location:   [facility]   Place of Service:   SNF  Margit HanksAlexander, Polina Burmaster D, MD  Patient Care Team: Margit HanksAlexander, Sheylin Scharnhorst D, MD as PCP - General (Internal Medicine) [facility] ([facility] Facility) Chilton SiGreen, Chong Sicilianeborah S, NP as Nurse Practitioner (Geriatric Medicine)  Extended Emergency Contact Information Primary Emergency Contact: Venetia MaxonWalker,[name] Jr Address: [address]          [place], [place] [id number] [place] Home Phone: [phone] Mobile Phone: [phone] Relation: Brother    Allergies: Penicillins  Chief Complaint  Patient presents with  . Acute Visit    HPI: Patient is 73 y.o. male who has had a stroke with left-sided weakness and dysphagia who nursing asked me to see urgently.  Patient did not look well and his O2 sat on 2 L is 67%.  Patient is being treated for aspiration pneumonia.  1st he was treated doxycycline and then over the weekend Zithromax was started.  Patient's chest x-ray had been normal.   Past Medical History:  Diagnosis Date  . Dysphasia   . Fatty liver   . Hemiplegia (HCC)   . Hypertension   . Pneumonia   . Pulmonary embolism (HCC)   . 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(HCC)   . Stroke [facility](HCC)     Past Surgical History:  Procedure Laterality Date  . FLEXIBLE SIGMOIDOSCOPY N/A 10/21/2016   Procedure: FLEXIBLE SIGMOIDOSCOPY;  Surgeon: Malissa Hippoehman, Najeeb U, MD;  Location: [facility];  Service: Endoscopy;  Laterality: N/A;  . FLEXIBLE SIGMOIDOSCOPY N/A 10/25/2016   Procedure: FLEXIBLE SIGMOIDOSCOPY;  Surgeon: Malissa Hippoehman, Najeeb U, MD;  Location: [facility];  Service: Endoscopy;  Laterality: N/A;  . unable      Allergies as of 05/09/2019      Reactions   Penicillins    Has patient had a PCN reaction causing immediate rash, facial/tongue/throat swelling, SOB or lightheadedness with hypotension: unknown Has patient had a PCN reaction causing severe rash involving mucus membranes or skin necrosis: unknown Has patient had a PCN reaction that required  hospitalization: unknown Has patient had a PCN reaction occurring within the last 10 years: unknown If all of the above answers are "NO", then may proceed with Cephalosporin use. 5/3 Tolerated Cefepime x 1 dose in ED.      Medication List    Notice   This visit is during an admission. Changes to the med list made in this visit will be reflected in the After Visit Summary of the admission.    Current Facility-Administered Medications on File Prior to Visit  Medication Dose Route Frequency Provider Last Rate Last Dose  . 0.45 % sodium chloride infusion   Intravenous Continuous Emokpae, Courage, MD 20 mL/hr at 05/13/19 1610    . 0.9 %  sodium chloride infusion  250 mL Intravenous PRN Emokpae, Courage, MD      . acetaminophen (TYLENOL) tablet 650 mg  650 mg Oral Q6H PRN Mariea ClontsEmokpae, Courage, MD   650 mg at 05/09/19 2251   Or  . acetaminophen (TYLENOL) suppository 650 mg  650 mg Rectal Q6H PRN Emokpae, Courage, MD      . albuterol (PROVENTIL) (2.5 MG/3ML) 0.083% nebulizer solution 2.5 mg  2.5 mg Nebulization Q2H PRN Mariea ClontsEmokpae, Courage, MD   2.5 mg at 05/13/19 0834  . atorvastatin (LIPITOR) tablet 10 mg  10 mg Oral Daily Emokpae, Courage, MD   10 mg at 05/13/19 0854  . bisacodyl (DULCOLAX) suppository 10 mg  10 mg Rectal QHS Shon HaleEmokpae, Courage, MD  10 mg at 05/10/19 2307  . ceFEPIme (MAXIPIME) 2 g in sodium chloride 0.9 % 100 mL IVPB  2 g Intravenous Q12H Emokpae, Courage, MD 200 mL/hr at 05/13/19 0852 2 g at 05/13/19 0852  . Chlorhexidine Gluconate Cloth 2 % PADS 6 each  6 each Topical Daily Roxan Hockey, MD   6 each at 05/13/19 0954  . dextrose 5 % solution   Intravenous Continuous Roxan Hockey, MD 75 mL/hr at 05/13/19 0650    . dextrose 50 % solution 0-50 mL  0-50 mL Intravenous PRN Emokpae, Courage, MD      . food thickener (THICK IT) powder   Oral PRN Emokpae, Courage, MD      . heparin injection 5,000 Units  5,000 Units Subcutaneous Q8H Emokpae, Courage, MD      . insulin aspart  (novoLOG) injection 0-5 Units  0-5 Units Subcutaneous QHS Emokpae, Courage, MD      . insulin aspart (novoLOG) injection 0-6 Units  0-6 Units Subcutaneous TID WC Emokpae, Courage, MD   1 Units at 05/13/19 1655  . [START ON 05/15/2019] ipratropium-albuterol (DUONEB) 0.5-2.5 (3) MG/3ML nebulizer solution 3 mL  3 mL Nebulization TID Emokpae, Courage, MD   3 mL at 05/12/19 0800  . loratadine (CLARITIN) tablet 10 mg  10 mg Oral Daily Denton Brick, Courage, MD   10 mg at 05/13/19 0853  . MEDLINE mouth rinse  15 mL Mouth Rinse BID Denton Brick, Courage, MD   15 mL at 05/13/19 0954  . methimazole (TAPAZOLE) tablet 5 mg  5 mg Oral Daily Emokpae, Courage, MD   5 mg at 05/13/19 0954  . metroNIDAZOLE (FLAGYL) IVPB 500 mg  500 mg Intravenous Q8H Emokpae, Courage, MD 100 mL/hr at 05/13/19 1701 500 mg at 05/13/19 1701  . mometasone-formoterol (DULERA) 100-5 MCG/ACT inhaler 2 puff  2 puff Inhalation BID Roxan Hockey, MD   2 puff at 05/13/19 5284  . nitrofurantoin (macrocrystal-monohydrate) (MACROBID) capsule 100 mg  100 mg Oral Q12H Emokpae, Courage, MD   100 mg at 05/13/19 0953  . ondansetron (ZOFRAN) tablet 4 mg  4 mg Oral Q6H PRN Emokpae, Courage, MD       Or  . ondansetron (ZOFRAN) injection 4 mg  4 mg Intravenous Q6H PRN Emokpae, Courage, MD      . polyethylene glycol (MIRALAX / GLYCOLAX) packet 17 g  17 g Oral BID Denton Brick, Courage, MD   17 g at 05/13/19 0854  . polyvinyl alcohol (LIQUIFILM TEARS) 1.4 % ophthalmic solution 1 drop  1 drop Right Eye TID Donnamae Jude, RPH   1 drop at 05/13/19 1603  . senna-docusate (Senokot-S) tablet 2 tablet  2 tablet Oral BID Donnamae Jude, [facility]   2 tablet at 05/13/19 1324  . sodium chloride flush (NS) 0.9 % injection 3 mL  3 mL Intravenous Q12H Emokpae, Courage, MD   3 mL at 05/13/19 0855  . sodium chloride flush (NS) 0.9 % injection 3 mL  3 mL Intravenous PRN Emokpae, Courage, MD      . traZODone (DESYREL) tablet 50 mg  50 mg Oral QHS PRN Roxan Hockey, MD   50 mg at 05/12/19  2212   Current Outpatient Medications on File Prior to Visit  Medication Sig Dispense Refill  . acetaminophen (TYLENOL) 325 MG tablet Take 650 mg by mouth every 6 (six) hours as needed for moderate pain.     Marland Kitchen apixaban (ELIQUIS) 5 MG TABS tablet Take 5 mg by mouth 2 (two) times daily.    Marland Kitchen  atorvastatin (LIPITOR) 10 MG tablet Take 10 mg by mouth daily.    Marland Kitchen azithromycin (ZITHROMAX) 500 MG tablet Take 500 mg by mouth daily.    . budesonide-formoterol (SYMBICORT) 80-4.5 MCG/ACT inhaler Inhale 2 puffs into the lungs 2 (two) times daily.    . carboxymethylcellulose (REFRESH PLUS) 0.5 % SOLN Place 1 drop into the right eye 3 (three) times daily.     Marland Kitchen erythromycin ophthalmic ointment Place into the left eye. appy 1 ribbon ophthalmic (eye) 3 times a day    . hydrochlorothiazide (HYDRODIURIL) 12.5 MG tablet Take 12.5 mg by mouth daily.    Melene Muller ON 05/15/2019] ipratropium-albuterol (DUONEB) 0.5-2.5 (3) MG/3ML SOLN Take 3 mLs by nebulization every 6 (six) hours as needed.    . labetalol (NORMODYNE) 200 MG tablet Take 200 mg by mouth daily. For HTN    . loratadine (CLARITIN) 10 MG tablet Take 10 mg by mouth daily.    . methimazole (TAPAZOLE) 5 MG tablet Take 5 mg by mouth daily.    . sennosides-docusate sodium (SENOKOT-S) 8.6-50 MG tablet Take 2 tablets by mouth 2 (two) times daily. For constipation    . umeclidinium bromide (INCRUSE ELLIPTA) 62.5 MCG/INH AEPB Inhale 1 puff into the lungs daily.    . valsartan (DIOVAN) 80 MG tablet Take 80 mg by mouth daily.       No orders of the defined types were placed in this encounter.   Immunization History  Administered Date(s) Administered  . Influenza-Unspecified 03/28/2014, 03/23/2016, 03/25/2017, 03/23/2018, 03/26/2019  . Pneumococcal Conjugate-13 04/21/2015  . Pneumococcal-Unspecified 11/01/2009, 03/30/2016  . Tdap 03/17/2017    Social History   Tobacco Use  . Smoking status: Never Smoker  . Smokeless tobacco: Never Used  Substance Use  Topics  . Alcohol use: No    Alcohol/week: 0.0 standard drinks    Review of Systems unable to obtain secondary to patient condition; nursing-as per history of present illness    Vitals:   05/13/19 1515  BP: 125/84  Pulse: 62  Resp: 20  Temp: 98.6 F (37 C)   Body mass index is 35.58 kg/m. Physical Exam  GENERAL APPEARANCE: Alert, nonconversant, appears to understand sitting up in his bed mostly staring SKIN: Pallor HEENT: Unremarkable RESPIRATORY: Breathing is moderately labored. Lung sounds are junky throughout, no wheezing worse in the right lower lobe CARDIOVASCULAR: Heart RRR no murmurs, rubs or gallops. No peripheral edema  GASTROINTESTINAL: Abdomen is soft, non-tender, not distended w/ normal bowel sounds.  GENITOURINARY: Bladder non tender, not distended  MUSCULOSKELETAL: No abnormal joints or musculature NEUROLOGIC: Cranial nerves 2-12 grossly intact, except for dysphagia, left-sided weakness PSYCHIATRIC: Unable to obtain  Patient Active Problem List   Diagnosis Date Noted  . Enterococcus faecalis UTI infection 05/11/2019  . Goals of care, counseling/discussion   . Palliative care encounter   . Bil PNA (pneumonia)--suspect aspiration related 05/09/2019  . Hypernatremia 05/09/2019  . Sepsis secondary to pneumonia 05/09/2019  . Acute hypoxic respiratory failure due to bilateral pneumonia 05/09/2019  . Diabetes mellitus with hyperglycemia--new diagnosis 05/09/2019  . AKI (acute kidney injury) (HCC) 05/09/2019  . HCAP (healthcare-associated pneumonia) 05/04/2019  . Elevated PSA, between 10 and less than 20 ng/ml 02/08/2019  . Aspiration pneumonia of right lower lobe (HCC) 01/30/2019  . 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 Dysphagia due to old cerebrovascular accident-on honey thickened liquids 08/25/2018  . Dyslipidemia 08/25/2018  . Hyperthyroidism 04/05/2017  . Hypertension 10/27/2016  . 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 COPD (chronic obstructive pulmonary disease) (HCC) 10/19/2016  . 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 Long term current use of anticoagulant therapy-on Eliquis due to history of DVT/PE and strokes 10/21/2012  . DVT (deep venous thrombosis) (HCC) 09/26/2012  . Chronic pulmonary embolism (HCC) 09/26/2012  . Aspiration pneumonia of left lower lobe (HCC) 09/26/2012  . Constipation 09/26/2012    CMP     Component Value Date/Time   NA 152 (H) 05/13/2019 0520   NA 142 09/12/2015   K 3.5 05/13/2019 0520   CL 119 (H) 05/13/2019 0520   CO2 26 05/13/2019 0520   GLUCOSE 205 (H) 05/13/2019 0520   BUN 25 (H) 05/13/2019 0520   BUN 18 09/12/2015   CREATININE 1.35 (H) 05/13/2019 0520   CALCIUM 7.7 (L) 05/13/2019 0520   PROT 8.7 (H) 05/09/2019 1310   ALBUMIN 2.4 (L) 05/13/2019 0520   AST 60 (H) 05/09/2019 1310   ALT 69 (H) 05/09/2019 1310   ALKPHOS 72 05/09/2019 1310   BILITOT 0.7 05/09/2019 1310   GFRNONAA 52 (L) 05/13/2019 0520   GFRAA 60 (L) 05/13/2019 0520   Recent Labs    05/11/19 0408  05/12/19 0441 05/12/19 1549 05/13/19 0520  NA 158*   < > 157* 155* 152*  K 3.8   < > 3.8 3.8 3.5  CL 120*   < > 121* 122* 119*  CO2 29   < > 28 27 26   GLUCOSE 98   < > 144* 132* 205*  BUN 36*   < > 32* 28* 25*  CREATININE 1.61*   < > 1.50* 1.37* 1.35*  CALCIUM 7.8*   < > 7.9* 7.7* 7.7*  PHOS 2.0*  --  2.0*  --  1.7*   < > = values in this interval not displayed.   Recent Labs    02/05/19 0620 05/09/19 1310  05/11/19 0408 05/12/19 0441 05/13/19 0520  AST 17 60*  --   --   --   --   ALT 17 69*  --   --   --   --   ALKPHOS 50 72  --   --   --   --   BILITOT 0.4 0.7  --   --   --   --   PROT 7.2 8.7*  --   --   --   --   ALBUMIN 3.2* 3.4*   < > 2.6* 2.4* 2.4*   < > = values in this interval not displayed.   Recent Labs    05/05/19 0400 05/09/19 1200  05/09/19 1310 05/10/19 0451 05/11/19 0408 05/13/19 0520  WBC 12.3* 13.3* 12.1* 10.7* 11.1* 8.6  NEUTROABS 9.8* 10.4* 9.4*  --   --   --   HGB 16.7 16.9 17.3* 15.7 15.0 14.2  HCT 53.4* 56.5* 58.4* 54.4* 52.3* 48.2  MCV 98.2 102.7* 103.0* 106.9* 107.2* 104.6*  PLT 246 261 259 221 210 166   Recent Labs    02/05/19 0620  CHOL 106  LDLCALC 62  TRIG 54   No results found for: [facility] Lab Results  Component Value Date   TSH 2.668 03/16/2019   Lab Results  Component Value Date   HGBA1C 11.3 (H) 05/09/2019   Lab Results  Component Value Date   CHOL 106 02/05/2019   HDL 33 (L) 02/05/2019   LDLCALC 62 02/05/2019   TRIG 54 02/05/2019  CHOLHDL 3.2 02/05/2019    Significant Diagnostic Results in last 30 days:  Ct Abdomen Pelvis Wo Contrast  Result Date: 05/09/2019 CLINICAL DATA:  Abdominal distension. Possible volvulus. EXAM: CT ABDOMEN AND PELVIS WITHOUT CONTRAST TECHNIQUE: Multidetector CT imaging of the abdomen and pelvis was performed following the standard protocol without IV contrast. COMPARISON:  11/03/2016 FINDINGS: Lower chest: Motion artifact through the lung bases with consolidative opacities in the lower lobes and to a lesser extent posterior lingula and right middle lobe. No pleural effusion. Coronary atherosclerosis. Hepatobiliary: 2 cm cyst inferiorly in the right hepatic lobe, mildly enlarged from 2018. Contracted gallbladder. No biliary dilatation. Pancreas: Unremarkable. Spleen: Unremarkable. Adrenals/Urinary Tract: Unremarkable adrenal glands. Unchanged small calcifications in both renal hila which may represent vascular calcifications or non-obstructing calculi. No hydroureteronephrosis. Unremarkable bladder. Stomach/Bowel: The stomach is collapsed. There is no small bowel dilatation. There is a moderately large amount of stool in the distal sigmoid colon and rectum. The more proximal sigmoid colon is redundant and moderately distended by gas. An ordinary amount of  stool is present in the more proximal colon, which is nondilated. Left-sided colonic diverticulosis is noted without evidence of diverticulitis. The appendix is unremarkable. Vascular/Lymphatic: Aortic atherosclerosis without aneurysm. No enlarged lymph nodes. Reproductive: Unremarkable prostate. Other: No ascites or pneumoperitoneum. Fat containing paraumbilical hernia. Musculoskeletal: No acute osseous abnormality or suspicious osseous lesion. Mild lumbar levoscoliosis. Advanced lumbar spondylosis with interbody and facet ankylosis at L4-5. IMPRESSION: 1. Moderately large amount of stool in the distal sigmoid colon and rectum. Moderate gaseous distension of the more proximal sigmoid colon without evidence of volvulus or bowel obstruction. 2. Bibasilar lung consolidation concerning for pneumonia. 3. Aortic Atherosclerosis (ICD10-I70.0). Electronically Signed   By: Sebastian Ache M.D.   On: 05/09/2019 15:45   Dg Chest Port 1 View  Result Date: 05/13/2019 CLINICAL DATA:  Sepsis. EXAM: PORTABLE CHEST 1 VIEW COMPARISON:  05/09/2019 FINDINGS: 0528 hours. Lordotic position. Low lung volumes. The cardio pericardial silhouette is enlarged. There is pulmonary vascular congestion without overt pulmonary edema. Bibasilar collapse/consolidation again noted, left greater than right. IMPRESSION: Cardiomegaly with low lung volumes and bibasilar collapse/consolidation, left greater than right. Electronically Signed   By: Kennith Center M.D.   On: 05/13/2019 07:13   Dg Chest Port 1 View  Result Date: 05/09/2019 CLINICAL DATA:  Shortness of breath.  Hypoxia. EXAM: PORTABLE CHEST 1 VIEW COMPARISON:  January 16, 2019. FINDINGS: Stable cardiomediastinal silhouette. No pneumothorax is noted. Hypoinflation of the lungs is noted. Mild bibasilar atelectasis or infiltrates are noted. Bony thorax is unremarkable. IMPRESSION: Mild bibasilar subsegmental atelectasis or infiltrates are noted. Electronically Signed   By: Lupita Raider M.D.    On: 05/09/2019 13:26    Assessment and Plan  Sepsis/acute respiratory failure with hypoxia/probable pneumonia-patient is clearly septic.  Oxygen saturation reached 88 to 89% on 6 L at the bedside which is the highest is easily available in SNF.  Patient will need more in addition to IV antibiotics.  Patient is being sent to the ED.   Late entry-results return from blood drawn this morning from the patient-thank you I thank You identified high WBC is 13.5, sodium 151, BUN 82, creatinine 2.78 and glucose 689.  7 addition to sepsis patient is hyponatremic and acute renal failure and in new onset diabetes Time spent greater than 35 minutes Margit Hanks, MD

## 2019-05-14 LAB — GLUCOSE, CAPILLARY
Glucose-Capillary: 103 mg/dL — ABNORMAL HIGH (ref 70–99)
Glucose-Capillary: 125 mg/dL — ABNORMAL HIGH (ref 70–99)
Glucose-Capillary: 126 mg/dL — ABNORMAL HIGH (ref 70–99)
Glucose-Capillary: 126 mg/dL — ABNORMAL HIGH (ref 70–99)
Glucose-Capillary: 134 mg/dL — ABNORMAL HIGH (ref 70–99)
Glucose-Capillary: 142 mg/dL — ABNORMAL HIGH (ref 70–99)
Glucose-Capillary: 143 mg/dL — ABNORMAL HIGH (ref 70–99)
Glucose-Capillary: 147 mg/dL — ABNORMAL HIGH (ref 70–99)
Glucose-Capillary: 163 mg/dL — ABNORMAL HIGH (ref 70–99)
Glucose-Capillary: 165 mg/dL — ABNORMAL HIGH (ref 70–99)
Glucose-Capillary: 181 mg/dL — ABNORMAL HIGH (ref 70–99)
Glucose-Capillary: 189 mg/dL — ABNORMAL HIGH (ref 70–99)
Glucose-Capillary: 198 mg/dL — ABNORMAL HIGH (ref 70–99)
Glucose-Capillary: 68 mg/dL — ABNORMAL LOW (ref 70–99)

## 2019-05-14 LAB — RENAL FUNCTION PANEL
Albumin: 2.2 g/dL — ABNORMAL LOW (ref 3.5–5.0)
Anion gap: 8 (ref 5–15)
BUN: 18 mg/dL (ref 8–23)
CO2: 26 mmol/L (ref 22–32)
Calcium: 7.6 mg/dL — ABNORMAL LOW (ref 8.9–10.3)
Chloride: 113 mmol/L — ABNORMAL HIGH (ref 98–111)
Creatinine, Ser: 1.37 mg/dL — ABNORMAL HIGH (ref 0.61–1.24)
GFR calc Af Amer: 59 mL/min — ABNORMAL LOW (ref >60)
GFR calc non Af Amer: 51 mL/min — ABNORMAL LOW (ref >60)
Glucose, Bld: 187 mg/dL — ABNORMAL HIGH (ref 70–99)
Phosphorus: 1.7 mg/dL — ABNORMAL LOW (ref 2.5–4.6)
Potassium: 3.1 mmol/L — ABNORMAL LOW (ref 3.5–5.1)
Sodium: 147 mmol/L — ABNORMAL HIGH (ref 135–145)

## 2019-05-14 LAB — CULTURE, BLOOD (ROUTINE X 2)
Culture: NO GROWTH
Culture: NO GROWTH
Special Requests: ADEQUATE
Special Requests: ADEQUATE

## 2019-05-14 LAB — PROTIME-INR
INR: 1.4 — ABNORMAL HIGH (ref 0.8–1.2)
Prothrombin Time: 16.6 seconds — ABNORMAL HIGH (ref 11.4–15.2)

## 2019-05-14 MED ORDER — CIPROFLOXACIN IN D5W 400 MG/200ML IV SOLN
400.0000 mg | INTRAVENOUS | Status: AC
  Administered 2019-05-15: 07:00:00 400 mg via INTRAVENOUS
  Filled 2019-05-14: qty 200

## 2019-05-14 MED ORDER — CHLORHEXIDINE GLUCONATE CLOTH 2 % EX PADS
6.0000 | MEDICATED_PAD | Freq: Once | CUTANEOUS | Status: AC
  Administered 2019-05-14: 6 via TOPICAL

## 2019-05-14 MED ORDER — LABETALOL HCL 200 MG PO TABS
100.0000 mg | ORAL_TABLET | Freq: Two times a day (BID) | ORAL | Status: DC
  Administered 2019-05-15 – 2019-05-17 (×5): 100 mg via ORAL
  Filled 2019-05-14 (×5): qty 1

## 2019-05-14 MED ORDER — POTASSIUM CHLORIDE CRYS ER 20 MEQ PO TBCR
40.0000 meq | EXTENDED_RELEASE_TABLET | ORAL | Status: AC
  Administered 2019-05-14 (×2): 40 meq via ORAL
  Filled 2019-05-14 (×2): qty 2

## 2019-05-14 MED ORDER — POTASSIUM CHLORIDE 10 MEQ/100ML IV SOLN: 10.0000 meq | INTRAVENOUS | Status: DC

## 2019-05-14 MED ORDER — CHLORHEXIDINE GLUCONATE CLOTH 2 % EX PADS
6.0000 | MEDICATED_PAD | Freq: Once | CUTANEOUS | Status: AC
  Administered 2019-05-15: 06:00:00 6 via TOPICAL

## 2019-05-14 NOTE — Progress Notes (Signed)
Patient Demographics:    [name] [name], is a 73 y.o. male, DOB - 07-29-1945, GEX:[id number]  Admit date - 05/09/2019   Admitting Physician  Denton Brick, MD  Outpatient Primary MD for the patient is [name], MD  LOS - 5  Chief Complaint  Patient presents with  . Respiratory Distress      Subjective:    [name] [name] today has  no emesis,  No chest pain,   - Breathing and swallowing difficulties persist  Assessment  & Plan :    Principal Problem:   Sepsis secondary to pneumonia Active Problems:   Long term current use of anticoagulant therapy-on Eliquis due to history of DVT/PE and strokes   Bil PNA (pneumonia)--suspect aspiration related   Hypernatremia   Acute hypoxic respiratory failure due to bilateral pneumonia   Dysphagia due to old cerebrovascular accident-on honey thickened liquids   AKI (acute kidney injury) ([place])   Enterococcus faecalis UTI infection   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, as well as history of hyperthyroidism, HTN and recurrent episodes of sigmoid volvulus admitted on 05/09/2019 from [facility] with hypoxia requiring nonrebreather bag, patient has been unwell for the last couple of days with worsening respiratory status and reported fevers-imaging studies suggest bilateral pneumonia probably aspiration related, also found with severe hyperglycemia and severe hypernatremia without frank DKA  -Continues to have issues with aspiration   A/p 1)-Sepsis secondary to Bilateral Pneumonia--suspect aspiration related in  the patient with dysphagia--- has penicillin allergy -Pharmacy consult appreciated - C/n Cefepime and Flagyl  -c/n IVF -Patient met sepsis criteria on admission with transient hypotension requiring aggressive IV fluid boluses, tachypnea, hypoxia, leukocytosis and, reports of fever -COVID-19 test Neg  2)Bilateral Pneumonia--- suspect recurrent aspiration related,  antibiotics as above #1 -c/n bronchodilators and mucolytics -Significant hypoxia persist -WBC is down to 8.6 from 13.3 -Repeat chest x-ray on 05/15/2019  3)Acute Hypoxic Respiratory Failure--- secondary to #2 above, treat as above #1 and #2, -Continue high flow oxygen supplementation, -use  BiPAP qhs and prn   4)New onset DM severe hyperglycemia without frank DKA--  --A1c 11.3 -Glycemic control continues to improve, Continue to hold Lantus for now as glycemic control is improved ,-okay to use sliding scale coverage -Continue IV fluids  5)HyperNatremia--sodium is down to 147 from 161,  - due to dehydration and free water deficit, continue IVF with half-normal saline - suspect significant free water deficit in the patient with dysphagia who is on honey thickened liquids and now with severe hyperglycemia and sepsis-- monitor serial BMP and adjust IV fluid rate and composition as indicated -Plan for PEG tube placement on 05/15/2019 to allow for free water and medication administration  6)AKI----acute kidney injury due to severe dehydration in the setting of sepsis with transient hypotension as well as significant dehydration and volume depletion in a patient who is on  honey thickened liquids-     creatinine on admission=2.85  ,   baseline creatinine = 1.0 ( 6 months PTA ), creatinine is down to 1.37 with hydration,  renally adjust medications, avoid nephrotoxic agents/dehydration/hypotension -Continue  to Hold Diovan and HCTZ  7)History of prior stroke/DVT/PE---  hold Eliquis for now to allow for PEG tube placement  8)Dysphagia--- patient with prior stroke with right hemiparesis and Dysphagia, PTA was on honey thickened liquids.  N.p.o. 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  -Discussed with patient's brother/POA -[name] [name] [name] at [phone]  -Plan of care and advanced directive discussed, patient is a full code  10)H/o Prior CVA with residual right sided hemiparesis and Dysphagia--- hold Eliquis to allow for PEG tube placement -continue Lipitor  11)Hyperthyroidism: -Continue methimazole, okay to restart labetalol  12)History of recurrent sigmoid volvulus and chronic constipation--CT abdomen and pelvis without evidence of volvulus or SBO at this time, laxatives as ordered  13) Enterococcus faecalis UTI--resistant to quinolones, patient is allergy to penicillins, treat empirically with Macrobid, watch renal function closely  Disposition/Need for in-Hospital Stay- patient unable to be discharged at this time due to -sepsis in the setting of bilateral pneumonia requiring IV antibiotics and IV fluids with acute hypoxic respiratory failure requiring high flow oxygen -Awaiting PEG tube placement--Plan for PEG tube placement on 05/15/2019 to allow for free water and medication administration  Code Status : Full Code  Family Communication:  Discussed with patient's brother/POA -[name] [name] at [phone]--[id number]  times during this hospitalization  Disposition Plan  : TBD  Antibiotic therapy Vancomycin 11/18>>11/18 Cefepime 11/18 >>  Flagyl 11/18>> Macrobid 11/20 >> 11/27  Consults  :  na  DVT Prophylaxis  : Eliquis on hold- SCDs /SQ Heparin to be held prior to PEG tube placement  Lab Results  Component Value Date   PLT 166 05/13/2019    Inpatient Medications  Scheduled Meds: . atorvastatin  10 mg Oral Daily  . bisacodyl  10 mg Rectal QHS  . Chlorhexidine Gluconate Cloth  6 each Topical Daily  . heparin injection (subcutaneous)  5,000 Units Subcutaneous Q8H  . insulin aspart  0-5 Units Subcutaneous QHS  . insulin aspart  0-6 Units Subcutaneous TID WC  . ipratropium-albuterol  3 mL Nebulization TID  . mouth rinse  15 mL Mouth Rinse BID  . methimazole  5 mg Oral Daily  . mometasone-formoterol  2 puff Inhalation BID  . nitrofurantoin (macrocrystal-monohydrate)  100 mg Oral Q12H  . polyethylene glycol  17 g Oral BID  . polyvinyl alcohol  1 drop Right Eye TID  . senna-docusate  2 tablet Oral BID  . sodium chloride flush  3 mL Intravenous Q12H   Continuous Infusions: . sodium chloride 20 mL/hr at 05/14/19 1600  . sodium chloride    . ceFEPime (MAXIPIME) IV Stopped (05/14/19 1055)  . dextrose 75 mL/hr at 05/14/19 1600  . metronidazole Stopped (05/14/19 1210)   PRN Meds:.sodium chloride, acetaminophen **OR** acetaminophen, albuterol, dextrose, food thickener, ondansetron **OR** ondansetron (ZOFRAN) IV, sodium chloride flush, traZODone   Anti-infectives (From admission, onward)   Start  Dose/Rate Route Frequency Ordered Stop   05/11/19 1600  vancomycin (VANCOCIN) 1,500 mg in sodium chloride 0.9 % 500 mL IVPB  Status:  Discontinued     1,500 mg 250 mL/hr over 120 Minutes Intravenous Every 48 hours 05/09/19 1704 05/10/19 0809   05/11/19 1130  nitrofurantoin (macrocrystal-monohydrate) (MACROBID) capsule 100 mg     100 mg Oral Every 12 hours 05/11/19 1125 05/18/19 0959   05/10/19  1400  ceFEPIme (MAXIPIME) 2 g in sodium chloride 0.9 % 100 mL IVPB  Status:  Discontinued     2 g 200 mL/hr over 30 Minutes Intravenous Every 24 hours 05/09/19 1704 05/10/19 0813   05/10/19 1000  ceFEPIme (MAXIPIME) 2 g in sodium chloride 0.9 % 100 mL IVPB     2 g 200 mL/hr over 30 Minutes Intravenous Every 12 hours 05/10/19 0813     05/09/19 1900  metroNIDAZOLE (FLAGYL) IVPB 500 mg     500 mg 100 mL/hr over 60 Minutes Intravenous Every 8 hours 05/09/19 1842     05/09/19 1400  vancomycin (VANCOCIN) IVPB 1000 mg/200 mL premix     1,000 mg 200 mL/hr over 60 Minutes Intravenous Every 1 hr x 2 05/09/19 1302 05/09/19 1643   05/09/19 1300  vancomycin (VANCOCIN) IVPB 1000 mg/200 mL premix  Status:  Discontinued     1,000 mg 200 mL/hr over 60 Minutes Intravenous  Once 05/09/19 1256 05/09/19 1302   05/09/19 1300  ceFEPIme (MAXIPIME) 2 g in sodium chloride 0.9 % 100 mL IVPB     2 g 200 mL/hr over 30 Minutes Intravenous  Once 05/09/19 1256 05/09/19 1349        Objective:   Vitals:   05/14/19 1400 05/14/19 1500 05/14/19 1600 05/14/19 1636  BP: (!) 122/44 (!) 143/66 (!) 151/45   Pulse: 89 87 87   Resp: 19 (!) 27 (!) 22   Temp:    99.4 F (37.4 C)  TempSrc:    Axillary  SpO2: 97% 97% 97%   Weight:      Height:        Wt Readings from Last 3 Encounters:  05/14/19 105.8 kg  05/13/19 106.1 kg  05/08/19 106.4 kg     Intake/Output Summary (Last 24 hours) at 05/14/2019 1748 Last data filed at 05/14/2019 1640 Gross per 24 hour  Intake 3233.36 ml  Output 1550 ml  Net 1683.36 ml   Physical Exam  Gen:- Awake Alert, appears comfortable HEENT:- [place].AT, No sclera icterus  Nose- HF Nasal canula Neck-Supple Neck,No JVD,.  Lungs-improving air movement, much less wheezy CV- S1, S2 normal, regular  Abd-  +ve B.Sounds, Abd Soft, No tenderness, soft umbilical hernia Extremity/Skin:- No  edema, pedal pulses present  Psych-affect is appropriate, oriented x3 Neuro-residual right-sided  hemiparesis, residual facial droop with residual dysarthria from prior stroke, no new focal deficits, no tremors   Data Review:   Micro Results Recent Results (from the past 240 hour(s))  Culture, blood (routine x 2)     Status: None   Collection Time: 05/05/19  4:00 AM   Specimen: BLOOD RIGHT HAND  Result Value Ref Range Status   Specimen Description BLOOD RIGHT HAND  Final   Special Requests   Final    BOTTLES DRAWN AEROBIC AND ANAEROBIC Blood Culture results may not be optimal due to an excessive volume of blood received in culture bottles   Culture   Final    NO GROWTH 5 DAYS Performed at [facility], [address] [place],  [place] [address]    Report Status 05/10/2019 FINAL  Final  Culture, blood (routine x 2)     Status: None   Collection Time: 05/05/19  4:00 AM   Specimen: BLOOD LEFT HAND  Result Value Ref Range Status   Specimen Description BLOOD LEFT HAND  Final   Special Requests   Final    BOTTLES DRAWN AEROBIC AND ANAEROBIC Blood Culture adequate volume   Culture   Final    NO GROWTH 5 DAYS Performed at [facility], [address] [place], [place] [address]    Report Status 05/10/2019 FINAL  Final  Urine culture     Status: Abnormal   Collection Time: 05/09/19 12:55 PM   Specimen: In/Out Cath Urine  Result Value Ref Range Status   Specimen Description   Final    IN/OUT CATH URINE Performed at [facility], [address], [place], [place] [address]    Special Requests   Final    NONE Performed at [facility], [address], [place], [place] [address]    Culture >=100,000 COLONIES/mL ENTEROCOCCUS FAECALIS (A)  Final   Report Status 05/11/2019 FINAL  Final   Organism ID, Bacteria ENTEROCOCCUS FAECALIS (A)  Final      Susceptibility   Enterococcus faecalis - MIC*    AMPICILLIN <=2 SENSITIVE Sensitive     LEVOFLOXACIN >=8 RESISTANT Resistant     NITROFURANTOIN <=16 SENSITIVE Sensitive     VANCOMYCIN 1 SENSITIVE Sensitive     * >=100,000 COLONIES/mL  ENTEROCOCCUS FAECALIS  Blood Culture (routine x 2)     Status: None   Collection Time: 05/09/19  1:10 PM   Specimen: BLOOD RIGHT HAND  Result Value Ref Range Status   Specimen Description BLOOD RIGHT HAND  Final   Special Requests   Final    BOTTLES DRAWN AEROBIC AND ANAEROBIC Blood Culture adequate volume   Culture   Final    NO GROWTH 5 DAYS Performed at [facility], [address], [place], [place] [address]    Report Status 05/14/2019 FINAL  Final  Blood Culture (routine x 2)     Status: None   Collection Time: 05/09/19  1:10 PM   Specimen: BLOOD LEFT HAND  Result Value Ref Range Status   Specimen Description BLOOD LEFT HAND  Final   Special Requests   Final    BOTTLES DRAWN AEROBIC AND ANAEROBIC Blood Culture adequate volume   Culture   Final    NO GROWTH 5 DAYS Performed at [facility], [address] [place], [place] [address]    Report Status 05/14/2019 FINAL  Final  SARS CORONAVIRUS 2 (TAT 6-24 HRS) Nasopharyngeal Nasopharyngeal Swab     Status: None   Collection Time: 05/09/19  2:28 PM   Specimen: Nasopharyngeal Swab  Result Value Ref Range Status   SARS Coronavirus 2 NEGATIVE NEGATIVE Final    Comment: (NOTE) SARS-CoV-2 target nucleic acids are NOT DETECTED. The SARS-CoV-2 RNA is generally detectable in upper and lower respiratory specimens during the acute phase of infection. Negative results do not preclude SARS-CoV-2 infection, do not rule out co-infections with other pathogens, and should not be used as the sole basis for treatment or other patient management decisions. Negative results must be combined with clinical observations, patient history, and epidemiological information. The expected result is Negative. Fact Sheet for Patients: SugarRoll.be Fact Sheet for Healthcare Providers: https://www.woods-mathews.com/ This test is not yet approved or cleared by the [place] FDA and  has been authorized for  detection and/or diagnosis of SARS-CoV-2 by FDA under an Emergency Use Authorization (EUA). This EUA will remain  in effect (meaning this test can be used) for the duration of the COVID-19 declaration under Section 56 4(b)(1) of the Act, 21 U.S.C. section 360bbb-3(b)(1), unless the authorization is terminated or revoked sooner. Performed at [facility], [place] [address] [place], [place] [address]   MRSA PCR Screening     Status: None   Collection Time: 05/09/19  8:47 PM   Specimen: Nasal Mucosa; Nasopharyngeal  Result Value Ref Range Status   MRSA by PCR NEGATIVE NEGATIVE Final    Comment:        The GeneXpert MRSA Assay (FDA approved for NASAL specimens only), is one component of a comprehensive MRSA colonization surveillance program. It is not intended to diagnose MRSA infection nor to guide or monitor treatment for MRSA infections. Performed at [facility], [address] [place], [place] [address]    Radiology Reports Ct Abdomen Pelvis Wo Contrast  Result Date: 05/09/2019 CLINICAL DATA:  Abdominal distension. Possible volvulus. EXAM: CT ABDOMEN AND PELVIS WITHOUT CONTRAST TECHNIQUE: Multidetector CT imaging of the abdomen and pelvis was performed following the standard protocol without IV contrast. COMPARISON:  11/03/2016 FINDINGS: Lower chest: Motion artifact through the lung bases with consolidative opacities in the lower lobes and to a lesser extent posterior lingula and right middle lobe. No pleural effusion. Coronary atherosclerosis. Hepatobiliary: 2 cm cyst inferiorly in the right hepatic lobe, mildly enlarged from 2018. Contracted gallbladder. No biliary dilatation. Pancreas: Unremarkable. Spleen: Unremarkable. Adrenals/Urinary Tract: Unremarkable adrenal glands. Unchanged small calcifications in both renal hila which may represent vascular calcifications or non-obstructing calculi. No hydroureteronephrosis. Unremarkable bladder. Stomach/Bowel: The stomach is  collapsed. There is no small bowel dilatation. There is a moderately large amount of stool in the distal sigmoid colon and rectum. The more proximal sigmoid colon is redundant and moderately distended by gas. An ordinary amount of stool is present in the more proximal colon, which is nondilated. Left-sided colonic diverticulosis is noted without evidence of diverticulitis. The appendix is unremarkable. Vascular/Lymphatic: Aortic atherosclerosis without aneurysm. No enlarged lymph nodes. Reproductive: Unremarkable prostate. Other: No ascites or pneumoperitoneum. Fat containing paraumbilical hernia. Musculoskeletal: No acute osseous abnormality or suspicious osseous lesion. Mild lumbar levoscoliosis. Advanced lumbar spondylosis with interbody and facet ankylosis at L4-5. IMPRESSION: 1. Moderately large amount of stool in the distal sigmoid colon and rectum. Moderate gaseous distension of the more proximal sigmoid colon without evidence of volvulus or bowel obstruction. 2. Bibasilar lung consolidation concerning for pneumonia. 3. Aortic Atherosclerosis (ICD10-I70.0). Electronically Signed   By: Logan Bores M.D.   On: 05/09/2019 15:45   Dg Chest Port 1 View  Result Date: 05/13/2019 CLINICAL DATA:  Sepsis. EXAM: PORTABLE CHEST 1 VIEW COMPARISON:  05/09/2019 FINDINGS: 0528 hours. Lordotic position. Low lung volumes. The cardio pericardial silhouette is enlarged. There is pulmonary vascular congestion without overt pulmonary edema. Bibasilar collapse/consolidation again noted, left greater than right. IMPRESSION: Cardiomegaly with low lung volumes and bibasilar collapse/consolidation, left greater than right. Electronically Signed   By: Misty Stanley M.D.   On: 05/13/2019 07:13   Dg Chest Port 1 View  Result Date: 05/09/2019 CLINICAL DATA:  Shortness of breath.  Hypoxia. EXAM: PORTABLE CHEST 1 VIEW COMPARISON:  January 16, 2019. FINDINGS: Stable cardiomediastinal silhouette. No pneumothorax is noted. Hypoinflation  of the lungs is noted. Mild bibasilar atelectasis or infiltrates are noted. Bony thorax is unremarkable. IMPRESSION: Mild bibasilar subsegmental atelectasis or infiltrates are  noted. Electronically Signed   By: Marijo Conception M.D.   On: 05/09/2019 13:26    CBC Recent Labs  Lab 05/09/19 1200 05/09/19 1310 05/10/19 0451 05/11/19 0408 05/13/19 0520  WBC 13.3* 12.1* 10.7* 11.1* 8.6  HGB 16.9 17.3* 15.7 15.0 14.2  HCT 56.5* 58.4* 54.4* 52.3* 48.2  PLT 261 259 221 210 166  MCV 102.7* 103.0* 106.9* 107.2* 104.6*  MCH 30.7 30.5 30.8 30.7 30.8  MCHC 29.9* 29.6* 28.9* 28.7* 29.5*  RDW 14.1 14.2 14.3 14.3 14.0  LYMPHSABS 1.8 1.6  --   --   --   MONOABS 0.8 0.8  --   --   --   EOSABS 0.2 0.2  --   --   --   BASOSABS 0.0 0.0  --   --   --     Chemistries  Recent Labs  Lab 05/09/19 1310  05/11/19 1601 05/12/19 0441 05/12/19 1549 05/13/19 0520 05/14/19 0428  NA 152*   < > 156* 157* 155* 152* 147*  K 4.8   < > 3.7 3.8 3.8 3.5 3.1*  CL 107   < > 120* 121* 122* 119* 113*  CO2 32   < > _0 GLUCOSE 676*   < > 138* 144* 132* 205* 187*  BUN 81*   < > 33* 32* 28* 25* 18  CREATININE 2.76*   < > 1.81* 1.50* 1.37* 1.35* 1.37*  CALCIUM 9.1   < > 7.8* 7.9* 7.7* 7.7* 7.6*  AST 60*  --   --   --   --   --   --   ALT 69*  --   --   --   --   --   --   ALKPHOS 72  --   --   --   --   --   --   BILITOT 0.7  --   --   --   --   --   --    < > = values in this interval not displayed.   ------------------------------------------------------------------------------------------------------------------ No results for input(s): CHOL, HDL, LDLCALC, TRIG, CHOLHDL, LDLDIRECT in the last 72 hours.  Lab Results  Component Value Date   HGBA1C 11.3 (H) 05/09/2019   ------------------------------------------------------------------------------------------------------------------ No results for input(s): TSH, T4TOTAL, T3FREE, THYROIDAB in the last 72 hours.  Invalid input(s): FREET3  ------------------------------------------------------------------------------------------------------------------ No results for input(s): VITAMINB12, FOLATE, FERRITIN, TIBC, IRON, RETICCTPCT in the last 72 hours.  Coagulation profile Recent Labs  Lab 05/09/19 1310 05/14/19 1531  INR 1.9* 1.4*    No results for input(s): DDIMER in the last 72 hours.  Cardiac Enzymes No results for input(s): CKMB, TROPONINI, MYOGLOBIN in the last 168 hours.  Invalid input(s): CK   ------------------------------------------------------------------------------------------------------------------    Component Value Date/Time   BNP 18.0 02/21/2017 0730    Roxan Hockey M.D on 05/14/2019 at 5:48 PM  Go to [email or web address] - for contact info  [facility] - Office  [phone]

## 2019-05-14 NOTE — Progress Notes (Signed)
[facility]  Patient with dysphagia and aspiration pneumonia. He has had a prior stroke 17 years ago. He has had a PEG in the past, and needs a new one for feeds and for hydration.   Plan for PEG tomorrow. Last dose of Eliqius was Friday evening. On Heparin sq only.  Talked to brother, [name] [name], his decision maker (cell [phone]). He gave consent for the procedure. [name] [name] has a doctor's appt tomorrow, so anesthesia will need to call his cell phone. His appt is at 9AM. NPO midnight.   [name] Labrum, MD [facility] [address] [place], [place] [address] [phone]/ [phone] (office)

## 2019-05-15 ENCOUNTER — Inpatient Hospital Stay (HOSPITAL_COMMUNITY): Payer: Medicare Other | Admitting: Anesthesiology

## 2019-05-15 ENCOUNTER — Encounter (HOSPITAL_COMMUNITY): Payer: Self-pay | Admitting: Anesthesiology

## 2019-05-15 ENCOUNTER — Encounter (HOSPITAL_COMMUNITY)
Admission: EM | Disposition: A | Discharge status: Nursing Home (Includes ICF) | Payer: Self-pay | Source: Home / Self Care | Attending: Family Medicine

## 2019-05-15 ENCOUNTER — Inpatient Hospital Stay (HOSPITAL_COMMUNITY): Payer: Medicare Other

## 2019-05-15 DIAGNOSIS — Z931 Gastrostomy status: Secondary | ICD-10-CM

## 2019-05-15 DIAGNOSIS — Z978 Presence of other specified devices: Secondary | ICD-10-CM | POA: Diagnosis present

## 2019-05-15 HISTORY — PX: PEG PLACEMENT: SHX5437

## 2019-05-15 HISTORY — PX: ESOPHAGOGASTRODUODENOSCOPY (EGD) WITH PROPOFOL: SHX5813

## 2019-05-15 LAB — BASIC METABOLIC PANEL
Anion gap: 7 (ref 5–15)
BUN: 15 mg/dL (ref 8–23)
CO2: 26 mmol/L (ref 22–32)
Calcium: 7.5 mg/dL — ABNORMAL LOW (ref 8.9–10.3)
Chloride: 110 mmol/L (ref 98–111)
Creatinine, Ser: 1.37 mg/dL — ABNORMAL HIGH (ref 0.61–1.24)
GFR calc Af Amer: 59 mL/min — ABNORMAL LOW (ref >60)
GFR calc non Af Amer: 51 mL/min — ABNORMAL LOW (ref >60)
Glucose, Bld: 178 mg/dL — ABNORMAL HIGH (ref 70–99)
Potassium: 3.5 mmol/L (ref 3.5–5.1)
Sodium: 143 mmol/L (ref 135–145)

## 2019-05-15 LAB — CBC WITH DIFFERENTIAL/PLATELET
Abs Immature Granulocytes: 0.05 10*3/uL (ref 0.00–0.07)
Basophils Absolute: 0 10*3/uL (ref 0.0–0.1)
Basophils Relative: 0 %
Eosinophils Absolute: 0.2 10*3/uL (ref 0.0–0.5)
Eosinophils Relative: 2 %
HCT: 42.2 % (ref 39.0–52.0)
Hemoglobin: 12.9 g/dL — ABNORMAL LOW (ref 13.0–17.0)
Immature Granulocytes: 1 %
Lymphocytes Relative: 21 %
Lymphs Abs: 1.7 10*3/uL (ref 0.7–4.0)
MCH: 30.6 pg (ref 26.0–34.0)
MCHC: 30.6 g/dL (ref 30.0–36.0)
MCV: 100.2 fL — ABNORMAL HIGH (ref 80.0–100.0)
Monocytes Absolute: 0.5 10*3/uL (ref 0.1–1.0)
Monocytes Relative: 6 %
Neutro Abs: 5.7 10*3/uL (ref 1.7–7.7)
Neutrophils Relative %: 70 %
Platelets: 165 10*3/uL (ref 150–400)
RBC: 4.21 MIL/uL — ABNORMAL LOW (ref 4.22–5.81)
RDW: 13.3 % (ref 11.5–15.5)
WBC: 8.2 10*3/uL (ref 4.0–10.5)
nRBC: 0 % (ref 0.0–0.2)

## 2019-05-15 LAB — BLOOD GAS, ARTERIAL
Acid-Base Excess: 2.6 mmol/L — ABNORMAL HIGH (ref 0.0–2.0)
Bicarbonate: 25.9 mmol/L (ref 20.0–28.0)
FIO2: 100
O2 Saturation: 93.6 %
Patient temperature: 37.9
pCO2 arterial: 51.2 mmHg — ABNORMAL HIGH (ref 32.0–48.0)
pH, Arterial: 7.352 (ref 7.350–7.450)
pO2, Arterial: 71.3 mmHg — ABNORMAL LOW (ref 83.0–108.0)

## 2019-05-15 LAB — GLUCOSE, CAPILLARY
Glucose-Capillary: 164 mg/dL — ABNORMAL HIGH (ref 70–99)
Glucose-Capillary: 133 mg/dL — ABNORMAL HIGH (ref 70–99)
Glucose-Capillary: 135 mg/dL — ABNORMAL HIGH (ref 70–99)
Glucose-Capillary: 176 mg/dL — ABNORMAL HIGH (ref 70–99)
Glucose-Capillary: 150 mg/dL — ABNORMAL HIGH (ref 70–99)
Glucose-Capillary: 154 mg/dL — ABNORMAL HIGH (ref 70–99)
Glucose-Capillary: 146 mg/dL — ABNORMAL HIGH (ref 70–99)

## 2019-05-15 SURGERY — INSERTION, PEG TUBE
Anesthesia: General | Site: Esophagus

## 2019-05-15 MED ORDER — SUCCINYLCHOLINE CHLORIDE 200 MG/10ML IV SOSY
PREFILLED_SYRINGE | INTRAVENOUS | Status: AC
  Filled 2019-05-15: qty 20

## 2019-05-15 MED ORDER — LACTATED RINGERS IV SOLN
INTRAVENOUS | Status: DC | PRN
  Administered 2019-05-15: 08:00:00 via INTRAVENOUS

## 2019-05-15 MED ORDER — ROCURONIUM BROMIDE 100 MG/10ML IV SOLN
INTRAVENOUS | Status: DC | PRN
  Administered 2019-05-15: 10 mg via INTRAVENOUS
  Administered 2019-05-15: 40 mg via INTRAVENOUS

## 2019-05-15 MED ORDER — ONDANSETRON HCL 4 MG/2ML IJ SOLN
INTRAMUSCULAR | Status: DC | PRN
  Administered 2019-05-15: 4 mg via INTRAVENOUS

## 2019-05-15 MED ORDER — PHENYLEPHRINE 40 MCG/ML (10ML) SYRINGE FOR IV PUSH (FOR BLOOD PRESSURE SUPPORT)
PREFILLED_SYRINGE | INTRAVENOUS | Status: AC
  Filled 2019-05-15: qty 10

## 2019-05-15 MED ORDER — PROPOFOL 1000 MG/100ML IV EMUL
0.0000 ug/kg/min | INTRAVENOUS | Status: DC
  Administered 2019-05-15: 20:00:00 15 ug/kg/min via INTRAVENOUS
  Administered 2019-05-15: 5 ug/kg/min via INTRAVENOUS
  Administered 2019-05-16: 19:00:00 25 ug/kg/min via INTRAVENOUS
  Administered 2019-05-16: 15 ug/kg/min via INTRAVENOUS
  Administered 2019-05-16 – 2019-05-17 (×2): 25 ug/kg/min via INTRAVENOUS
  Administered 2019-05-17: 12:00:00 15 ug/kg/min via INTRAVENOUS
  Administered 2019-05-17: 20 ug/kg/min via INTRAVENOUS
  Administered 2019-05-17: 25 ug/kg/min via INTRAVENOUS
  Administered 2019-05-18: 20 ug/kg/min via INTRAVENOUS
  Administered 2019-05-18: 10 ug/kg/min via INTRAVENOUS
  Filled 2019-05-15 (×11): qty 100

## 2019-05-15 MED ORDER — ORAL CARE MOUTH RINSE
15.0000 mL | OROMUCOSAL | Status: DC
  Administered 2019-05-15 – 2019-05-19 (×40): 15 mL via OROMUCOSAL

## 2019-05-15 MED ORDER — SUCCINYLCHOLINE CHLORIDE 200 MG/10ML IV SOSY
PREFILLED_SYRINGE | INTRAVENOUS | Status: DC | PRN
  Administered 2019-05-15: 160 mg via INTRAVENOUS

## 2019-05-15 MED ORDER — KETAMINE HCL 50 MG/5ML IJ SOSY
PREFILLED_SYRINGE | INTRAMUSCULAR | Status: AC
  Filled 2019-05-15: qty 5

## 2019-05-15 MED ORDER — KETAMINE HCL 10 MG/ML IJ SOLN
INTRAMUSCULAR | Status: DC | PRN
  Administered 2019-05-15: 15 mg via INTRAVENOUS

## 2019-05-15 MED ORDER — FREE WATER
200.0000 mL | Freq: Four times a day (QID) | Status: DC
  Administered 2019-05-15 – 2019-05-17 (×8): 200 mL

## 2019-05-15 MED ORDER — LIDOCAINE 2% (20 MG/ML) 5 ML SYRINGE
INTRAMUSCULAR | Status: AC
  Filled 2019-05-15: qty 5

## 2019-05-15 MED ORDER — PROPOFOL 10 MG/ML IV BOLUS
INTRAVENOUS | Status: DC | PRN
  Administered 2019-05-15: 120 mg via INTRAVENOUS

## 2019-05-15 MED ORDER — MIDAZOLAM HCL 2 MG/2ML IJ SOLN
INTRAMUSCULAR | Status: DC | PRN
  Administered 2019-05-15: 1 mg via INTRAVENOUS

## 2019-05-15 MED ORDER — LACTATED RINGERS IV SOLN
INTRAVENOUS | Status: DC
  Administered 2019-05-15: 1000 mL via INTRAVENOUS

## 2019-05-15 MED ORDER — MIDAZOLAM HCL 2 MG/2ML IJ SOLN
INTRAMUSCULAR | Status: AC
  Filled 2019-05-15: qty 2

## 2019-05-15 MED ORDER — SUCCINYLCHOLINE CHLORIDE 200 MG/10ML IV SOSY
PREFILLED_SYRINGE | INTRAVENOUS | Status: AC
  Filled 2019-05-15: qty 10

## 2019-05-15 MED ORDER — FENTANYL CITRATE (PF) 100 MCG/2ML IJ SOLN: 25.0000 ug | INTRAMUSCULAR | Status: DC | PRN

## 2019-05-15 MED ORDER — PROPOFOL 10 MG/ML IV BOLUS
INTRAVENOUS | Status: AC
  Filled 2019-05-15: qty 40

## 2019-05-15 MED ORDER — CHLORHEXIDINE GLUCONATE 0.12% ORAL RINSE (MEDLINE KIT)
15.0000 mL | Freq: Two times a day (BID) | OROMUCOSAL | Status: DC
  Administered 2019-05-15 – 2019-05-23 (×17): 15 mL via OROMUCOSAL

## 2019-05-15 MED ORDER — STERILE WATER FOR IRRIGATION IR SOLN
Status: DC | PRN
  Administered 2019-05-15: 1000 mL

## 2019-05-15 MED ORDER — SENNOSIDES 8.8 MG/5ML PO SYRP
5.0000 mL | ORAL_SOLUTION | Freq: Two times a day (BID) | ORAL | Status: DC | PRN
  Administered 2019-05-20: 5 mL
  Filled 2019-05-15 (×2): qty 5

## 2019-05-15 SURGICAL SUPPLY — 11 items
BAG DRN RND TRDRP ANRFLXCHMBR (UROLOGICAL SUPPLIES) ×2
BAG URINE DRAIN 2000ML AR STRL (UROLOGICAL SUPPLIES) ×2 IMPLANT
BINDER ABDOMINAL 12 ML 46-62 (SOFTGOODS) ×4 IMPLANT
BINDER ABDOMINAL 12 XL 75-84 (SOFTGOODS) ×4 IMPLANT
GLOVE BIOGEL PI IND STRL 6.5 (GLOVE) ×2 IMPLANT
GLOVE BIOGEL PI INDICATOR 6.5 (GLOVE) ×2
GLOVE SURG SS PI 7.5 STRL IVOR (GLOVE) ×4 IMPLANT
KIT PEG SAFETY 20FR (KITS) ×4 IMPLANT
KIT TURNOVER KIT A (KITS) ×4 IMPLANT
MANIFOLD NEPTUNE II (INSTRUMENTS) ×4 IMPLANT
PAD ABD 5X9 TENDERSORB (GAUZE/BANDAGES/DRESSINGS) ×4 IMPLANT

## 2019-05-15 NOTE — Progress Notes (Signed)
Patient Demographics:    [name] [name], is a 73 y.o. male, DOB - 05-Mar-1946, VBT:[id number]  Admit date - 05/09/2019   Admitting Physician [name] Livengood Denton Brick, MD  Outpatient Primary MD for the patient is [name] Duos, MD  LOS - 6  Chief Complaint  Patient presents with   Respiratory Distress      Subjective:    [name] [name] today had persistent respiratory distress, -Currently intubated and sedated after PEG placement procedure -  Assessment  & Plan :    Principal Problem:   Sepsis secondary to pneumonia Active Problems:   Long term current use of anticoagulant therapy-on Eliquis due to history of DVT/PE and strokes   Bil PNA (pneumonia)--suspect aspiration related   Hypernatremia   Acute hypoxic respiratory failure due to bilateral pneumonia   Endotracheally intubated--- on ventilator   Dysphagia due to old cerebrovascular accident-on honey thickened liquids   AKI (acute kidney injury) ([name] [name])   Enterococcus faecalis UTI infection   PEG (percutaneous endoscopic gastrostomy) status -Placed 05/15/2019   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, as well as history of hyperthyroidism, HTN and recurrent episodes of sigmoid volvulus admitted on 05/09/2019 from [facility] with hypoxia requiring nonrebreather bag, patient has been unwell for the last couple of days with worsening respiratory status and reported fevers-imaging studies  suggest bilateral pneumonia probably aspiration related, also found with severe hyperglycemia and severe hypernatremia without frank DKA -Continues to have issues with aspiration -Had PEG on 05/15/2019, remains intubated and sedated post-procedure   A/p 1)-Sepsis secondary to Bilateral Pneumonia--suspect aspiration related in  the patient with dysphagia--- has penicillin allergy -Pharmacy consult appreciated - C/n Cefepime and Flagyl  -c/n IVF -Patient met sepsis criteria on admission with transient hypotension requiring aggressive IV fluid boluses, tachypnea, hypoxia, leukocytosis and, reports of fever -COVID-19 test Neg  2)Bilateral Pneumonia--- suspect recurrent aspiration related,  antibiotics as above #1 -c/n bronchodilators and mucolytics -Significant hypoxia persist -WBC is down to 8.6 from 13.3 -Repeat chest x-ray on 05/15/2019 noted --Had PEG on 05/15/2019, remains intubated and sedated post-procedure  3)Acute Hypoxic Respiratory Failure--- secondary to #2 above, treat as above #1 and #2, -Hypoxia and respiratory distress persistent --Had PEG on 05/15/2019, remains intubated and sedated post-procedure  4)New onset DM severe hyperglycemia without frank DKA--  --A1c 11.3 -Glycemic control continues to improve, Continue to hold Lantus for now as glycemic control is improved ,-okay to use sliding scale coverage -Continue IV fluids  5)HyperNatremia--sodium is down to 143 from 161,  - due to dehydration and free water deficit, continue IVF with half-normal saline - suspect significant free water deficit in the patient with dysphagia who is on honey thickened liquids and now with severe hyperglycemia and sepsis-- monitor serial BMP and adjust IV fluid rate and composition as indicated had PEG tube placement on 05/15/2019 to allow for free water and  medication administration -Okay to give free water via PEG tube starting 05/15/2019  6)AKI----acute kidney injury due to severe  dehydration in the setting of sepsis with transient hypotension as well as significant dehydration and volume depletion in a patient who is on honey thickened liquids-     creatinine on admission=2.85  ,   baseline creatinine = 1.0 ( 6 months PTA ), creatinine is down to 1.37 with hydration,  renally adjust medications, avoid nephrotoxic agents/dehydration/hypotension -Continue to Hold Diovan and HCTZ  7)History of prior stroke/DVT/PE---  hold Eliquis for now to allow for PEG tube placement -  8)Dysphagia--- patient with prior stroke with right hemiparesis and Dysphagia, PTA was on honey thickened liquids.  N.p.o. 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  -Discussed with patient's brother/POA -Mr [name] [name] at [phone]  -Plan of care and advanced directive discussed, patient is a full code  10)H/o Prior CVA with residual right sided hemiparesis and Dysphagia--- hold Eliquis to allow for PEG tube placement -continue Lipitor  11)Hyperthyroidism: -Continue methimazole, okay to restart labetalol  12)History of recurrent sigmoid volvulus and chronic constipation--CT abdomen and pelvis without evidence of volvulus or SBO at this time, laxatives as ordered  13) Enterococcus faecalis UTI--resistant to quinolones, patient is allergy to penicillins, treat empirically with Macrobid, watch renal function closely  CRITICAL CARE Performed by: [name] [name]   Total critical care time: 46 minutes  Critical care time was  exclusive of separately billable procedures and treating other patients.  Critical care was necessary to treat or prevent imminent or life-threatening deterioration.  Ventilator Settings-  Fio2- 85%/PEEP 5/RR 18/TV 650  After reviewing ABG results I Dropped FiO2 down to 85% from 100%,  Critical care was time spent personally by me on the following activities: development of treatment plan with patient and/or surrogate as well as nursing, discussions with consultants, evaluation of patient's response to treatment, examination of patient, obtaining history from patient or surrogate, ordering and performing treatments and interventions, ordering and review of laboratory studies, ordering and review of radiographic studies, pulse oximetry and re-evaluation of patient's condition.   Disposition/Need for in-Hospital Stay- patient unable to be discharged at this time due to -sepsis in the setting of bilateral pneumonia requiring IV antibiotics and IV fluids with acute hypoxic respiratory failure requiring high flow oxygen -Had PEG on 05/15/2019, remains intubated and sedated post-procedure  Code Status : Full Code  Family Communication:  Discussed with patient's brother/POA -Mr Juandedios Dudash at [phone]--[id number] times during this hospitalization  Disposition Plan  : TBD  Antibiotic therapy Vancomycin 11/18>>11/18 Cefepime 11/18 >>  Flagyl 11/18>> Macrobid 11/20 >> 11/27  Consults  :  na  DVT Prophylaxis  : Eliquis on hold- SCDs /SQ Heparin to be held prior to PEG tube placement  Lab Results  Component Value Date   PLT 165 05/15/2019    Inpatient Medications  Scheduled Meds:  atorvastatin  10 mg Oral Daily   bisacodyl  10 mg Rectal QHS   chlorhexidine gluconate (MEDLINE KIT)  15 mL Mouth Rinse BID   Chlorhexidine Gluconate Cloth  6 each Topical Daily  heparin injection (subcutaneous)  5,000 Units Subcutaneous Q8H   insulin aspart  0-5 Units Subcutaneous QHS   insulin  aspart  0-6 Units Subcutaneous TID WC   ipratropium-albuterol  3 mL Nebulization TID   labetalol  100 mg Oral BID   mouth rinse  15 mL Mouth Rinse BID   mouth rinse  15 mL Mouth Rinse 10 times per day   methimazole  5 mg Oral Daily   mometasone-formoterol  2 puff Inhalation BID   nitrofurantoin (macrocrystal-monohydrate)  100 mg Oral Q12H   polyethylene glycol  17 g Oral BID   polyvinyl alcohol  1 drop Right Eye TID   senna-docusate  2 tablet Oral BID   sodium chloride flush  3 mL Intravenous Q12H   Continuous Infusions:  sodium chloride 20 mL/hr at 05/15/19 1750   sodium chloride     ceFEPime (MAXIPIME) IV Stopped (05/15/19 1023)   dextrose 40 mL/hr at 05/15/19 1750   metronidazole 500 mg (05/15/19 1828)   propofol (DIPRIVAN) infusion 15 mcg/kg/min (05/15/19 1750)   PRN Meds:.sodium chloride, acetaminophen **OR** acetaminophen, albuterol, dextrose, fentaNYL (SUBLIMAZE) injection, fentaNYL (SUBLIMAZE) injection, food thickener, ondansetron **OR** ondansetron (ZOFRAN) IV, sennosides, sodium chloride flush, traZODone   Anti-infectives (From admission, onward)   Start     Dose/Rate Route Frequency Ordered Stop   05/15/19 0600  ciprofloxacin (CIPRO) IVPB 400 mg     400 mg 200 mL/hr over 60 Minutes Intravenous On call to O.R. 05/14/19 1951 05/15/19 0804   05/11/19 1600  vancomycin (VANCOCIN) 1,500 mg in sodium chloride 0.9 % 500 mL IVPB  Status:  Discontinued     1,500 mg 250 mL/hr over 120 Minutes Intravenous Every 48 hours 05/09/19 1704 05/10/19 0809   05/11/19 1130  nitrofurantoin (macrocrystal-monohydrate) (MACROBID) capsule 100 mg     100 mg Oral Every 12 hours 05/11/19 1125 05/18/19 0959   05/10/19 1400  ceFEPIme (MAXIPIME) 2 g in sodium chloride 0.9 % 100 mL IVPB  Status:  Discontinued     2 g 200 mL/hr over 30 Minutes Intravenous Every 24 hours 05/09/19 1704 05/10/19 0813   05/10/19 1000  ceFEPIme (MAXIPIME) 2 g in sodium chloride 0.9 % 100 mL IVPB     2  g 200 mL/hr over 30 Minutes Intravenous Every 12 hours 05/10/19 0813     05/09/19 1900  metroNIDAZOLE (FLAGYL) IVPB 500 mg     500 mg 100 mL/hr over 60 Minutes Intravenous Every 8 hours 05/09/19 1842     05/09/19 1400  vancomycin (VANCOCIN) IVPB 1000 mg/200 mL premix     1,000 mg 200 mL/hr over 60 Minutes Intravenous Every 1 hr x 2 05/09/19 1302 05/09/19 1643   05/09/19 1300  vancomycin (VANCOCIN) IVPB 1000 mg/200 mL premix  Status:  Discontinued     1,000 mg 200 mL/hr over 60 Minutes Intravenous  Once 05/09/19 1256 05/09/19 1302   05/09/19 1300  ceFEPIme (MAXIPIME) 2 g in sodium chloride 0.9 % 100 mL IVPB     2 g 200 mL/hr over 30 Minutes Intravenous  Once 05/09/19 1256 05/09/19 1349        Objective:   Vitals:   05/15/19 1615 05/15/19 1703 05/15/19 1730 05/15/19 1745  BP: 119/82  (!) 131/93 127/89  Pulse: 79 71 73 73  Resp: 18 18 18 18   Temp:  97.7 F (36.5 C)    TempSrc:  Axillary    SpO2: 96% 97% 97% 97%  Weight:      Height:  Wt Readings from Last 3 Encounters:  05/15/19 109.8 kg  05/13/19 106.1 kg  05/08/19 106.4 kg     Intake/Output Summary (Last 24 hours) at 05/15/2019 1847 Last data filed at 05/15/2019 1750 Gross per 24 hour  Intake 2951.84 ml  Output 951 ml  Net 2000.84 ml   Physical Exam  Gen:-Intubated and sedated HEENT:- [place].AT, No sclera icterus  Mouth-ET tube Neck-Supple Neck,No JVD,.  Lungs-improving air movement, scattered rhonchi  CV- S1, S2 normal, regular  Abd-  +ve B.Sounds, Abd Soft, No tenderness, soft umbilical hernia Extremity/Skin:- No  edema, pedal pulses present  Psych-intubated and sedated  neuro-Limited exam as patient is intubated and sedated, prior to intubation patient had residual right-sided hemiparesis, residual facial droop with residual dysarthria from prior stroke, no new focal deficits, no tremors   Data Review:   Micro Results Recent Results (from the past 240 hour(s))  Urine culture     Status: Abnormal    Collection Time: 05/09/19 12:55 PM   Specimen: In/Out Cath Urine  Result Value Ref Range Status   Specimen Description   Final    IN/OUT CATH URINE Performed at [facility], [address] [place], [place] [address]    Special Requests   Final    NONE Performed at [facility], [address] [place], [place] [address]    Culture >=100,000 COLONIES/mL ENTEROCOCCUS FAECALIS (A)  Final   Report Status 05/11/2019 FINAL  Final   Organism ID, Bacteria ENTEROCOCCUS FAECALIS (A)  Final      Susceptibility   Enterococcus faecalis - MIC*    AMPICILLIN <=2 SENSITIVE Sensitive     LEVOFLOXACIN >=8 RESISTANT Resistant     NITROFURANTOIN <=16 SENSITIVE Sensitive     VANCOMYCIN 1 SENSITIVE Sensitive     * >=100,000 COLONIES/mL ENTEROCOCCUS FAECALIS  Blood Culture (routine x 2)     Status: None   Collection Time: 05/09/19  1:10 PM   Specimen: BLOOD RIGHT HAND  Result Value Ref Range Status   Specimen Description BLOOD RIGHT HAND  Final   Special Requests   Final    BOTTLES DRAWN AEROBIC AND ANAEROBIC Blood Culture adequate volume   Culture   Final    NO GROWTH 5 DAYS Performed at [facility], [address], [place], [place] [address]    Report Status 05/14/2019 FINAL  Final  Blood Culture (routine x 2)     Status: None   Collection Time: 05/09/19  1:10 PM   Specimen: BLOOD LEFT HAND  Result Value Ref Range Status   Specimen Description BLOOD LEFT HAND  Final   Special Requests   Final    BOTTLES DRAWN AEROBIC AND ANAEROBIC Blood Culture adequate volume   Culture   Final    NO GROWTH 5 DAYS Performed at [facility], [address] [place], [place] [address]    Report Status 05/14/2019 FINAL  Final  SARS CORONAVIRUS 2 (TAT 6-24 HRS) Nasopharyngeal Nasopharyngeal Swab     Status: None   Collection Time: 05/09/19  2:28 PM   Specimen: Nasopharyngeal Swab  Result Value Ref Range Status   SARS Coronavirus 2 NEGATIVE NEGATIVE Final    Comment: (NOTE) SARS-CoV-2 target nucleic acids are  NOT DETECTED. The SARS-CoV-2 RNA is generally detectable in upper and lower respiratory specimens during the acute phase of infection. Negative results do not preclude SARS-CoV-2 infection, do not rule out co-infections with other pathogens, and should not be used as the sole basis for treatment or other patient management  decisions. Negative results must be combined with clinical observations, patient history, and epidemiological information. The expected result is Negative. Fact Sheet for Patients: SugarRoll.be Fact Sheet for Healthcare Providers: https://www.woods-mathews.com/ This test is not yet approved or cleared by the [place] FDA and  has been authorized for detection and/or diagnosis of SARS-CoV-2 by FDA under an Emergency Use Authorization (EUA). This EUA will remain  in effect (meaning this test can be used) for the duration of the COVID-19 declaration under Section 56 4(b)(1) of the Act, 21 U.S.C. section 360bbb-3(b)(1), unless the authorization is terminated or revoked sooner. Performed at [facility], [place] [address] [place], [place] [address]   MRSA PCR Screening     Status: None   Collection Time: 05/09/19  8:47 PM   Specimen: Nasal Mucosa; Nasopharyngeal  Result Value Ref Range Status   MRSA by PCR NEGATIVE NEGATIVE Final    Comment:        The GeneXpert MRSA Assay (FDA approved for NASAL specimens only), is one component of a comprehensive MRSA colonization surveillance program. It is not intended to diagnose MRSA infection nor to guide or monitor treatment for MRSA infections. Performed at [facility], [address], [place], [place] [address]    Radiology Reports Ct Abdomen Pelvis Wo Contrast  Result Date: 05/09/2019 CLINICAL DATA:  Abdominal distension. Possible volvulus. EXAM: CT ABDOMEN AND PELVIS WITHOUT CONTRAST TECHNIQUE: Multidetector CT imaging of the abdomen and pelvis was performed  following the standard protocol without IV contrast. COMPARISON:  11/03/2016 FINDINGS: Lower chest: Motion artifact through the lung bases with consolidative opacities in the lower lobes and to a lesser extent posterior lingula and right middle lobe. No pleural effusion. Coronary atherosclerosis. Hepatobiliary: 2 cm cyst inferiorly in the right hepatic lobe, mildly enlarged from 2018. Contracted gallbladder. No biliary dilatation. Pancreas: Unremarkable. Spleen: Unremarkable. Adrenals/Urinary Tract: Unremarkable adrenal glands. Unchanged small calcifications in both renal hila which may represent vascular calcifications or non-obstructing calculi. No hydroureteronephrosis. Unremarkable bladder. Stomach/Bowel: The stomach is collapsed. There is no small bowel dilatation. There is a moderately large amount of stool in the distal sigmoid colon and rectum. The more proximal sigmoid colon is redundant and moderately distended by gas. An ordinary amount of stool is present in the more proximal colon, which is nondilated. Left-sided colonic diverticulosis is noted without evidence of diverticulitis. The appendix is unremarkable. Vascular/Lymphatic: Aortic atherosclerosis without aneurysm. No enlarged lymph nodes. Reproductive: Unremarkable prostate. Other: No ascites or pneumoperitoneum. Fat containing paraumbilical hernia. Musculoskeletal: No acute osseous abnormality or suspicious osseous lesion. Mild lumbar levoscoliosis. Advanced lumbar spondylosis with interbody and facet ankylosis at L4-5. IMPRESSION: 1. Moderately large amount of stool in the distal sigmoid colon and rectum. Moderate gaseous distension of the more proximal sigmoid colon without evidence of volvulus or bowel obstruction. 2. Bibasilar lung consolidation concerning for pneumonia. 3. Aortic Atherosclerosis (ICD10-I70.0). Electronically Signed   By: Logan Bores M.D.   On: 05/09/2019 15:45   Dg Chest Port 1 View  Result Date: 05/15/2019 CLINICAL  DATA:  Dyspnea and sepsis EXAM: PORTABLE CHEST 1 VIEW COMPARISON:  Two days ago FINDINGS: Cardiomegaly and aortic tortuosity. Low volume chest with hazy density at the left more than right base. No pneumothorax or air bronchogram. Artifact from hardware. IMPRESSION: Low volume chest with hazy opacity at the bases correlating with atelectasis on most recent abdominal CT. Cardiomegaly. Electronically Signed   By: Monte Fantasia M.D.   On: 05/15/2019 07:28   Dg Chest [facility] 1 [address]  Result Date: 05/13/2019 CLINICAL DATA:  Sepsis. EXAM: PORTABLE CHEST 1 VIEW COMPARISON:  05/09/2019 FINDINGS: 0528 hours. Lordotic position. Low lung volumes. The cardio pericardial silhouette is enlarged. There is pulmonary vascular congestion without overt pulmonary edema. Bibasilar collapse/consolidation again noted, left greater than right. IMPRESSION: Cardiomegaly with low lung volumes and bibasilar collapse/consolidation, left greater than right. Electronically Signed   By: Misty Stanley M.D.   On: 05/13/2019 07:13   Dg Chest Port 1 View  Result Date: 05/09/2019 CLINICAL DATA:  Shortness of breath.  Hypoxia. EXAM: PORTABLE CHEST 1 VIEW COMPARISON:  January 16, 2019. FINDINGS: Stable cardiomediastinal silhouette. No pneumothorax is noted. Hypoinflation of the lungs is noted. Mild bibasilar atelectasis or infiltrates are noted. Bony thorax is unremarkable. IMPRESSION: Mild bibasilar subsegmental atelectasis or infiltrates are noted. Electronically Signed   By: Marijo Conception M.D.   On: 05/09/2019 13:26    CBC Recent Labs  Lab 05/09/19 1200 05/09/19 1310 05/10/19 0451 05/11/19 0408 05/13/19 0520 05/15/19 0549  WBC 13.3* 12.1* 10.7* 11.1* 8.6 8.2  HGB 16.9 17.3* 15.7 15.0 14.2 12.9*  HCT 56.5* 58.4* 54.4* 52.3* 48.2 42.2  PLT 261 259 221 210 166 165  MCV 102.7* 103.0* 106.9* 107.2* 104.6* 100.2*  MCH 30.7 30.5 30.8 30.7 30.8 30.6  MCHC 29.9* 29.6* 28.9* 28.7* 29.5* 30.6  RDW 14.1 14.2 14.3 14.3 14.0 13.3    LYMPHSABS 1.8 1.6  --   --   --  1.7  MONOABS 0.8 0.8  --   --   --  0.5  EOSABS 0.2 0.2  --   --   --  0.2  BASOSABS 0.0 0.0  --   --   --  0.0    Chemistries  Recent Labs  Lab 05/09/19 1310  05/12/19 0441 05/12/19 1549 05/13/19 0520 05/14/19 0428 05/15/19 0549  NA 152*   < > 157* 155* 152* 147* 143  K 4.8   < > 3.8 3.8 3.5 3.1* 3.5  CL 107   < > 121* 122* 119* 113* 110  CO2 32   < > 28 27 26 26 26   GLUCOSE 676*   < > 144* 132* 205* 187* 178*  BUN 81*   < > 32* 28* 25* 18 15  CREATININE 2.76*   < > 1.50* 1.37* 1.35* 1.37* 1.37*  CALCIUM 9.1   < > 7.9* 7.7* 7.7* 7.6* 7.5*  AST 60*  --   --   --   --   --   --   ALT 69*  --   --   --   --   --   --   ALKPHOS 72  --   --   --   --   --   --   BILITOT 0.7  --   --   --   --   --   --    < > = values in this interval not displayed.   ------------------------------------------------------------------------------------------------------------------ No results for input(s): CHOL, HDL, LDLCALC, TRIG, CHOLHDL, LDLDIRECT in the last 72 hours.  Lab Results  Component Value Date   HGBA1C 11.3 (H) 05/09/2019   ------------------------------------------------------------------------------------------------------------------ No results for input(s): TSH, T4TOTAL, T3FREE, THYROIDAB in the last 72 hours.  Invalid input(s): FREET3 ------------------------------------------------------------------------------------------------------------------ No results for input(s): VITAMINB12, FOLATE, FERRITIN, TIBC, IRON, RETICCTPCT in the last 72 hours.  Coagulation profile Recent Labs  Lab 05/09/19 1310 05/14/19 1531  INR 1.9* 1.4*    No results for input(s): DDIMER in the last 72  hours.  Cardiac Enzymes No results for input(s): CKMB, TROPONINI, MYOGLOBIN in the last 168 hours.  Invalid input(s): CK   ------------------------------------------------------------------------------------------------------------------    Component Value  Date/Time   BNP 18.0 02/21/2017 0730    [name] [name] M.D on 05/15/2019 at 6:47 PM  Go to [email or web address] - for contact info  [facility] - Office  [phone]

## 2019-05-15 NOTE — Transfer of Care (Signed)
Immediate Anesthesia Transfer of Care Note  Patient: [name] [name]  Procedure(s) Performed: PERCUTANEOUS ENDOSCOPIC GASTROSTOMY (PEG) PLACEMENT (N/A Abdomen) ESOPHAGOGASTRODUODENOSCOPY (EGD) WITH PROPOFOL (N/A Esophagus)  Patient Location: ICU  Anesthesia Type:General  Level of Consciousness: sedated  Airway & Oxygen Therapy: Patient remains intubated per anesthesia plan and Patient placed on Ventilator (see vital sign flow sheet for setting)  Post-op Assessment: Report given to RN and Post -op Vital signs reviewed and stable  Post vital signs: Reviewed and stable  Last Vitals:  Vitals Value Taken Time  BP 129/67 05/15/19 0902  Temp    Pulse 92 05/15/19 0911  Resp 21 05/15/19 0911  SpO2 99 % 05/15/19 0911  Vitals shown include unvalidated device data.  Last Pain:  Vitals:   05/15/19 0729  TempSrc: Oral  PainSc: 0-No pain     BP 129/67 P 92 Sa)2 53%    Complications: No apparent anesthesia complications

## 2019-05-15 NOTE — Anesthesia Postprocedure Evaluation (Signed)
Anesthesia Post Note  Patient: CHIN WACHTER  Procedure(s) Performed: PERCUTANEOUS ENDOSCOPIC GASTROSTOMY (PEG) PLACEMENT (N/A Abdomen) ESOPHAGOGASTRODUODENOSCOPY (EGD) WITH PROPOFOL (N/A Esophagus)  Patient location during evaluation: ICU Anesthesia Type: General Level of consciousness: sedated Vital Signs Assessment: post-procedure vital signs reviewed and stable Respiratory status: patient on ventilator - see flowsheet for VS Cardiovascular status: stable Anesthetic complications: no Comments: Pt remains sedated and ventilated;SaO2 100% with FiO2 100% with attempted weaning pending. VSS     Last Vitals:  Vitals:   05/15/19 1300 05/15/19 1307  BP:    Pulse:    Resp:    Temp:    SpO2: 99% 99%    Last Pain:  Vitals:   05/15/19 1151  TempSrc: Axillary  PainSc:                  [place] Rank

## 2019-05-15 NOTE — Progress Notes (Signed)
Pharmacy Antibiotic Note  [name] [name] is a 73 y.o. male admitted on 05/09/2019 with pneumonia.  Pharmacy has been consulted for  cefepime dosing.  Plan: Continue Cefepime 2gm IV q12h Also on flagyl 500mg  IV q8h F/U cxs and clinical progress Monitor V/S, labs  F/U LOT  Height: 5\' 8"  (172.7 cm) Weight: 242 lb 1 oz (109.8 kg) IBW/kg (Calculated) : 68.4  Temp (24hrs), Avg:99.7 F (37.6 C), Min:98.5 F (36.9 C), Max:100.3 F (37.9 C)  Recent Labs  Lab 05/09/19 1310 05/09/19 1458 05/09/19 1826  05/10/19 0451  05/11/19 0408  05/12/19 0441 05/12/19 1549 05/13/19 0520 05/14/19 0428 05/15/19 0549  WBC 12.1*  --   --   --  10.7*  --  11.1*  --   --   --  8.6  --  8.2  CREATININE 2.76* 2.85* 2.71*   < > 2.18*   < > 1.61*   < > 1.50* 1.37* 1.35* 1.37* 1.37*  LATICACIDVEN 2.1* 2.4* 2.5*  --  2.0*  --   --   --   --   --   --   --   --    < > = values in this interval not displayed.    Estimated Creatinine Clearance: 57.7 mL/min (A) (by C-G formula based on SCr of 1.37 mg/dL (H)).   Normalized CrCl is 45mls/min  Allergies  Allergen Reactions  . Penicillins     Has patient had a PCN reaction causing immediate rash, facial/tongue/throat swelling, SOB or lightheadedness with hypotension: unknown Has patient had a PCN reaction causing severe rash involving mucus membranes or skin necrosis: unknown Has patient had a PCN reaction that required hospitalization: unknown Has patient had a PCN reaction occurring within the last 10 years: unknown If all of the above answers are "NO", then may proceed with Cephalosporin use. 5/3 Tolerated Cefepime x 1 dose in ED.     Antimicrobials this admission: Vancomycin 11/18>>11/18 Cefepime 11/18 >>  Flagyl 11/18>> Macrobid 11/20 >> 11/27  Microbiology results: 11/18 BCx: ngtd 11/18 UCx: enterococcus faecalis  MRSA PCR: neg  Thank you for allowing pharmacy to be a part of this patient's care.  Isac Sarna, BS Pharm D, [place] Clinical  Pharmacist Pager [phone] 05/15/2019 11:13 AM

## 2019-05-15 NOTE — Care Management Important Message (Signed)
Important Message  Patient Details  Name: [name] [name] MRN: [id number] Date of Birth: [birth year]   Medicare Important Message Given:  Yes     Tommy Medal 05/15/2019, 2:12 PM

## 2019-05-15 NOTE — Anesthesia Procedure Notes (Signed)
Procedure Name: Intubation Date/Time: 05/15/2019 8:17 AM Performed by: Georgeanne Nim, CRNA Pre-anesthesia Checklist: Patient identified, Emergency Drugs available, Suction available, Patient being monitored and Timeout performed Patient Re-evaluated:Patient Re-evaluated prior to induction Oxygen Delivery Method: Circle system utilized Preoxygenation: Pre-oxygenation with 100% oxygen Induction Type: IV induction and Rapid sequence Laryngoscope Size: Mac and 4 Grade View: Grade II Tube size: 7.5 mm Number of attempts: 1 Airway Equipment and Method: Stylet Placement Confirmation: ETT inserted through vocal cords under direct vision,  positive ETCO2,  CO2 detector and breath sounds checked- equal and bilateral Secured at: 21 (teeth) cm Tube secured with: Tape Dental Injury: Teeth and Oropharynx as per pre-operative assessment

## 2019-05-15 NOTE — Progress Notes (Signed)
Pt being transported to surgery at this time.

## 2019-05-15 NOTE — Op Note (Addendum)
[facility] Operative Note  05/15/19  Preoperative Diagnosis:  Dysphagia, aspiration pneumonia    Postoperative Diagnosis: Same   Procedure(s) Performed: Palliative Percutaneous Endoscopic Gastrostomy Tube   Surgeon: Lanell Matar. 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 He has been living in a SNF.  The patient needs prolonged enteral nutrition because of dysphagia and aspiration. Given this prolonged need a percutaneous endoscopic gastrostomy tube was chosen. We discussed the risk and benefits of the procedure including but not limited to bleeding, infection, risk of injury to the esophagus or stomach, and need for possible open gastrostomy tube.   Findings: Normal stomach, prior scar from PEG    Procedure: The patient was taken to the operating room and placed supine. General anesthesia was induced. Intravenous antibiotics were not administered per protocol.  A JACHO approved time out was performed.  After adequate sedation and positioning, a mouth piece was placed into the patient's mouth and the endoscope was passed down into the stomach. No abnormalities were noted. Prior PEG scar seen on the stomach.  The duodenum was intubated and noted to have no gross abnormalities. The stomach was insufflated with air and the endoscope was positioned in the midportion and directed to the anterior abdominal wall. With the room darkened, a good light reflex was noted in the left upper quadrant.  Finger pressure was applied at the light reflex with adequate indentation on the stomach wall on endoscopy. A snare was passed into the stomach, opened fully, and positioned to encircle the point of  demonstrated finger indentation.   The skin was prepped, and the area was anesthetized with lidocaine and a 1cm incision was made.  The introducer needle was passed through the incision into the stomach under direct visualization with the gastroscope. The catheter was encircled with the snare, and the guidewire was passed and captured by the snare. The endoscope, snare, and guidewire were the withdrawn and pulled back into the mouth. The gastrostomy tube was attached to the loop of the guidewire and the whole thing was pulled back into the stomach. The gastrostomy tube was noted to be at the 3cm mark at the skin level.  The gastroscope was reintroduced and adequate placement was confirmed and a picture was taken. The gastrostomy tube was cute to length and the bumper and clamping appendage were placed. The patient tolerated the procedure well and was taken to the PACU in good condition.   Dr. Arnoldo Morale was assisted with the incision and abdominal portion of the procedure.     Curlene Labrum, MD [facility] [address] [place], [place] [address] [phone] (office)

## 2019-05-15 NOTE — Progress Notes (Signed)
[facility]  Can have meds through PEG starting after surgery. Can have feeds through PEG at 3pm. Notified [name] that PEG was complete. Patient going up intubated. Would hold on giving Eliquis until 05/16/19.   Updated Dr. Denton Brick about completion.   Curlene Labrum, MD [facility] [address] [place], [place] [address] [phone]/ [phone] (office)

## 2019-05-15 NOTE — Interval H&P Note (Signed)
History and Physical Interval Note:  05/15/2019 7:41 AM  [name] [name]  has presented today for surgery, with the diagnosis of aspiration; dysphagia.  The various methods of treatment have been discussed with the patient and family. After consideration of risks, benefits and other options for treatment, the patient has consented to  Procedure(s): PERCUTANEOUS ENDOSCOPIC GASTROSTOMY (PEG) PLACEMENT (N/A) ESOPHAGOGASTRODUODENOSCOPY (EGD) WITH PROPOFOL (N/A) as a surgical intervention.  The patient's history has been reviewed, patient examined, no change in status, stable for surgery.  I have reviewed the patient's chart and labs.  Questions were answered to the patient's satisfaction.    Patient with dysphagia, aspiration, coming in with aspiration pneumonia and hypernatremia from dehydration. Needs PEG for fluids and prevent aspiration with his swallowing. His family wants to continue with everything and want a PEG. They understand that the patient is going to get intubated and will go upstairs intubated.  Discussed with Dr. Denton Brick who is aware. Feels that this patient has option of PEG now and possible improvement versus hospice. Plan for Palliative PEG.  Virl Cagey

## 2019-05-15 NOTE — Anesthesia Preprocedure Evaluation (Signed)
Anesthesia Evaluation  Patient identified by MRN, date of birth, ID band Patient awake    Reviewed: Allergy & Precautions, NPO status , Patient's Chart, lab work & pertinent test results  Airway Mallampati: III  TM Distance: >3 FB Neck ROM: Full    Dental no notable dental hx. (+) Poor Dentition   Pulmonary pneumonia, COPD,  oxygen dependent,  See medicine note  Improving , but c/w high flow o2 [place] and sats ~93% D/w dr. Jacinto Reap. Band Pts POA [name] (brother )  Very poor surgical candidate from resp standpoint. They both Voiced understanding with plan for post op ventilation.  WTP-Full code    Pulmonary exam normal breath sounds clear to auscultation       Cardiovascular Exercise Tolerance: Poor hypertension, negative cardio ROS Normal cardiovascular examIII Rhythm:Regular Rate:Normal  No Echo  LBBB  Here for palliative GTube  Very poor post op prognosis    Neuro/Psych Min responsive -does answer yes and no and follows simple command. When told he was going back with an ETT after surgery and would require postop ventilation -voiced WTP CVA, Residual Symptoms negative psych ROS   GI/Hepatic negative GI ROS, Neg liver ROS,   Endo/Other  diabetesHyperthyroidism   Renal/GU Renal InsufficiencyRenal disease  negative genitourinary   Musculoskeletal negative musculoskeletal ROS (+)   Abdominal   Peds negative pediatric ROS (+)  Hematology negative hematology ROS (+)   Anesthesia Other Findings   Reproductive/Obstetrics negative OB ROS                             Anesthesia Physical Anesthesia Plan  ASA: IV  Anesthesia Plan: General   Post-op Pain Management:    Induction: Intravenous  PONV Risk Score and Plan: 2 and Treatment may vary due to age or medical condition and Ondansetron  Airway Management Planned: Oral ETT  Additional Equipment:   Intra-op Plan:   Post-operative Plan:  Post-operative intubation/ventilation  Informed Consent: I have reviewed the patients History and Physical, chart, labs and discussed the procedure including the risks, benefits and alternatives for the proposed anesthesia with the patient or authorized representative who has indicated his/her understanding and acceptance.     Dental advisory given  Plan Discussed with: CRNA  Anesthesia Plan Comments: (Plan Full PPE use  Plan GETA  d/w pt/ Dr. Jacinto Reap. And POA-WTP with same after Q&A  Plan postop ventilation and Dr. Jacinto Reap. D/w Hospitalist who understand plan  Very poor prognosisdiscussed with all -WTP)        Anesthesia Quick Evaluation

## 2019-05-15 NOTE — Progress Notes (Addendum)
MD Courage Emokpae changed FiO2 to 85% at this time.

## 2019-05-16 ENCOUNTER — Inpatient Hospital Stay (HOSPITAL_COMMUNITY): Payer: Medicare Other

## 2019-05-16 ENCOUNTER — Encounter (HOSPITAL_COMMUNITY): Payer: Self-pay | Admitting: General Surgery

## 2019-05-16 DIAGNOSIS — E0590 Thyrotoxicosis, unspecified without thyrotoxic crisis or storm: Secondary | ICD-10-CM

## 2019-05-16 LAB — CBC
HCT: 41 % (ref 39.0–52.0)
Hemoglobin: 12.5 g/dL — ABNORMAL LOW (ref 13.0–17.0)
MCH: 30.3 pg (ref 26.0–34.0)
MCHC: 30.5 g/dL (ref 30.0–36.0)
MCV: 99.5 fL (ref 80.0–100.0)
Platelets: 177 10*3/uL (ref 150–400)
RBC: 4.12 MIL/uL — ABNORMAL LOW (ref 4.22–5.81)
RDW: 13.2 % (ref 11.5–15.5)
WBC: 8 10*3/uL (ref 4.0–10.5)
nRBC: 0 % (ref 0.0–0.2)

## 2019-05-16 LAB — BASIC METABOLIC PANEL
Anion gap: 8 (ref 5–15)
BUN: 14 mg/dL (ref 8–23)
CO2: 27 mmol/L (ref 22–32)
Calcium: 7.6 mg/dL — ABNORMAL LOW (ref 8.9–10.3)
Chloride: 110 mmol/L (ref 98–111)
Creatinine, Ser: 1.42 mg/dL — ABNORMAL HIGH (ref 0.61–1.24)
GFR calc Af Amer: 56 mL/min — ABNORMAL LOW (ref >60)
GFR calc non Af Amer: 49 mL/min — ABNORMAL LOW (ref >60)
Glucose, Bld: 155 mg/dL — ABNORMAL HIGH (ref 70–99)
Potassium: 3.2 mmol/L — ABNORMAL LOW (ref 3.5–5.1)
Sodium: 145 mmol/L (ref 135–145)

## 2019-05-16 LAB — GLUCOSE, CAPILLARY
Glucose-Capillary: 139 mg/dL — ABNORMAL HIGH (ref 70–99)
Glucose-Capillary: 121 mg/dL — ABNORMAL HIGH (ref 70–99)
Glucose-Capillary: 145 mg/dL — ABNORMAL HIGH (ref 70–99)
Glucose-Capillary: 160 mg/dL — ABNORMAL HIGH (ref 70–99)
Glucose-Capillary: 161 mg/dL — ABNORMAL HIGH (ref 70–99)

## 2019-05-16 LAB — PHOSPHORUS
Phosphorus: 1.6 mg/dL — ABNORMAL LOW (ref 2.5–4.6)
Phosphorus: 1.8 mg/dL — ABNORMAL LOW (ref 2.5–4.6)

## 2019-05-16 LAB — MAGNESIUM
Magnesium: 1.9 mg/dL (ref 1.7–2.4)
Magnesium: 2 mg/dL (ref 1.7–2.4)

## 2019-05-16 LAB — BLOOD GAS, ARTERIAL
Acid-Base Excess: 4.2 mmol/L — ABNORMAL HIGH (ref 0.0–2.0)
Bicarbonate: 27.9 mmol/L (ref 20.0–28.0)
FIO2: 50
O2 Saturation: 91.2 %
Patient temperature: 38
pCO2 arterial: 42.7 mmHg (ref 32.0–48.0)
pH, Arterial: 7.436 (ref 7.350–7.450)
pO2, Arterial: 59.6 mmHg — ABNORMAL LOW (ref 83.0–108.0)

## 2019-05-16 LAB — TRIGLYCERIDES: Triglycerides: 98 mg/dL (ref <150)

## 2019-05-16 MED ORDER — ADULT MULTIVITAMIN W/MINERALS CH
1.0000 | ORAL_TABLET | Freq: Every day | ORAL | Status: DC
  Administered 2019-05-16 – 2019-05-23 (×8): 1
  Filled 2019-05-16 (×8): qty 1

## 2019-05-16 MED ORDER — VITAL HIGH PROTEIN PO LIQD
1000.0000 mL | ORAL | Status: DC
  Administered 2019-05-16 – 2019-05-21 (×6): 1000 mL

## 2019-05-16 MED ORDER — PRO-STAT SUGAR FREE PO LIQD
60.0000 mL | Freq: Two times a day (BID) | ORAL | Status: DC
  Administered 2019-05-16 – 2019-05-21 (×11): 60 mL
  Filled 2019-05-16 (×11): qty 60

## 2019-05-16 MED ORDER — APIXABAN 5 MG PO TABS
5.0000 mg | ORAL_TABLET | Freq: Two times a day (BID) | ORAL | Status: DC
  Administered 2019-05-16 – 2019-05-17 (×3): 5 mg via ORAL
  Filled 2019-05-16 (×3): qty 1

## 2019-05-16 NOTE — Consult Note (Signed)
Consult requested by: [facility] hospitalist, Dr. Denton Brick Consult requested for: Respiratory failure  HPI: This is a 73 year old nursing home resident who came to the hospital because of sepsis related to pneumonia presumably from aspiration.  He has had previous stroke and has known significant dysphagia.  He eventually was felt to be appropriate to have a PEG tube placed and that was done but because of his pneumonia and hypoxia he remained intubated and on the ventilator.  He is still intubated and on the ventilator and is down to 50% oxygen but not ready for extubation yet.  He has acute hypoxic respiratory failure secondary to the bilateral pneumonia.  He has some COPD at baseline.  History is from the medical record as he is intubated and on the ventilator  Past Medical History:  Diagnosis Date  . Dysphasia   . Fatty liver   . Hemiplegia ([place])   . Hypertension   . Pneumonia   . Pulmonary embolism ([place])   . 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([place])   . Stroke [facility])      History reviewed. No pertinent family history.  Not known if there is any family history of lung disease  Social History   Socioeconomic History  . Marital status: Single    Spouse name: Not on file  . Number of children: Not on file  . Years of education: Not on file  . Highest education level: Not on file  Occupational History  . Not on file  Social Needs  . Financial resource strain: Not hard at all  . Food insecurity    Worry: Never true    Inability: Never true  . Transportation needs    Medical: No    Non-medical: No  Tobacco Use  . Smoking status: Never Smoker  . Smokeless tobacco: Never Used  Substance and Sexual Activity  . Alcohol use: No    Alcohol/week: 0.0 standard drinks  . Drug use: No  . Sexual activity: Not on file  Lifestyle  . Physical activity    Days per week: 0 days    Minutes per session: 0 min  . Stress: Not at all  Relationships  . Social Herbalist on phone: Never    Gets  together: Once a week    Attends religious service: Never    Active member of club or organization: No    Attends meetings of clubs or organizations: Never    Relationship status: Never married  Other Topics Concern  . Not on file  Social History Narrative  . Not on file     ROS: Unobtainable    Objective: Vital signs in last 24 hours: Temp:  [97.7 F (36.5 C)-98.5 F (36.9 C)] 98.1 F (36.7 C) (11/25 1143) Pulse Rate:  [68-79] 72 (11/25 1143) Resp:  [12-19] 18 (11/25 1143) BP: (108-147)/(74-98) 121/79 (11/25 1000) SpO2:  [91 %-98 %] 94 % (11/25 1143) FiO2 (%):  [50 %-100 %] 50 % (11/25 1124) Weight:  [104.6 kg] 104.6 kg (11/25 0500) Weight change: -5.2 kg Last BM Date: 05/14/19  Intake/Output from previous day: 11/24 0701 - 11/25 0700 In: 2246 [I.V.:1661.3; IV Piggyback:384.7] Out: 1850 [Urine:1400; Drains:250]  PHYSICAL EXAM Constitutional: Intubated sedated on mechanical ventilation.  Eyes: Pupils react.  Ears nose mouth and throat: His mucous membranes are slightly dry.  Endotracheal tube in place.  Cardiovascular: His heart is regular with normal heart sounds.  I do not hear a gallop.  Respiratory: He still has bilateral rhonchi with some  wheezing.  Gastrointestinal: He has new PEG tube.  Neurological: Cannot assess.  Psychiatric: Cannot assess.  Musculoskeletal: Cannot assess  Lab Results: Basic Metabolic Panel: Recent Labs    05/14/19 0428 05/15/19 0549 05/16/19 0423 05/16/19 1033  NA 147* 143 145  --   K 3.1* 3.5 3.2*  --   CL 113* 110 110  --   CO2 26 26 27   --   GLUCOSE 187* 178* 155*  --   BUN 18 15 14   --   CREATININE 1.37* 1.37* 1.42*  --   CALCIUM 7.6* 7.5* 7.6*  --   MG  --   --   --  1.9  PHOS 1.7*  --   --  1.6*   Liver Function Tests: Recent Labs    05/14/19 0428  ALBUMIN 2.2*   No results for input(s): LIPASE, AMYLASE in the last 72 hours. No results for input(s): AMMONIA in the last 72 hours. CBC: Recent Labs    05/15/19 0549  05/16/19 0423  WBC 8.2 8.0  NEUTROABS 5.7  --   HGB 12.9* 12.5*  HCT 42.2 41.0  MCV 100.2* 99.5  PLT 165 177   Cardiac Enzymes: No results for input(s): CKTOTAL, CKMB, CKMBINDEX, TROPONINI in the last 72 hours. BNP: No results for input(s): PROBNP in the last 72 hours. D-Dimer: No results for input(s): DDIMER in the last 72 hours. CBG: Recent Labs    05/15/19 1149 05/15/19 1702 05/15/19 1942 05/16/19 0522 05/16/19 0732 05/16/19 1146  GLUCAP 150* 154* 146* 139* 121* 145*   Hemoglobin A1C: No results for input(s): HGBA1C in the last 72 hours. Fasting Lipid Panel: Recent Labs    05/16/19 0423  TRIG 98   Thyroid Function Tests: No results for input(s): TSH, T4TOTAL, FREET4, T3FREE, THYROIDAB in the last 72 hours. Anemia Panel: No results for input(s): VITAMINB12, FOLATE, FERRITIN, TIBC, IRON, RETICCTPCT in the last 72 hours. Coagulation: Recent Labs    05/14/19 1531  LABPROT 16.6*  INR 1.4*   Urine Drug Screen: Drugs of Abuse  No results found for: LABOPIA, COCAINSCRNUR, LABBENZ, AMPHETMU, THCU, LABBARB  Alcohol Level: No results for input(s): ETH in the last 72 hours. Urinalysis: No results for input(s): COLORURINE, LABSPEC, PHURINE, GLUCOSEU, HGBUR, BILIRUBINUR, KETONESUR, PROTEINUR, UROBILINOGEN, NITRITE, LEUKOCYTESUR in the last 72 hours.  Invalid input(s): APPERANCEUR Misc. Labs:   ABGS: Recent Labs    05/16/19 1102  PHART 7.436  PO2ART 59.6*  HCO3 27.9     MICROBIOLOGY: Recent Results (from the past 240 hour(s))  Urine culture     Status: Abnormal   Collection Time: 05/09/19 12:55 PM   Specimen: In/Out Cath Urine  Result Value Ref Range Status   Specimen Description   Final    IN/OUT CATH URINE Performed at [facility], [address], [place], [place] [address]    Special Requests   Final    NONE Performed at [facility], [address] [place], [place] [address]    Culture >=100,000 COLONIES/mL ENTEROCOCCUS FAECALIS (A)  Final    Report Status 05/11/2019 FINAL  Final   Organism ID, Bacteria ENTEROCOCCUS FAECALIS (A)  Final      Susceptibility   Enterococcus faecalis - MIC*    AMPICILLIN <=2 SENSITIVE Sensitive     LEVOFLOXACIN >=8 RESISTANT Resistant     NITROFURANTOIN <=16 SENSITIVE Sensitive     VANCOMYCIN 1 SENSITIVE Sensitive     * >=100,000 COLONIES/mL ENTEROCOCCUS FAECALIS  Blood Culture (routine x 2)     Status:  None   Collection Time: 05/09/19  1:10 PM   Specimen: BLOOD RIGHT HAND  Result Value Ref Range Status   Specimen Description BLOOD RIGHT HAND  Final   Special Requests   Final    BOTTLES DRAWN AEROBIC AND ANAEROBIC Blood Culture adequate volume   Culture   Final    NO GROWTH 5 DAYS Performed at [facility], [address] [place], [place] [address]    Report Status 05/14/2019 FINAL  Final  Blood Culture (routine x 2)     Status: None   Collection Time: 05/09/19  1:10 PM   Specimen: BLOOD LEFT HAND  Result Value Ref Range Status   Specimen Description BLOOD LEFT HAND  Final   Special Requests   Final    BOTTLES DRAWN AEROBIC AND ANAEROBIC Blood Culture adequate volume   Culture   Final    NO GROWTH 5 DAYS Performed at [facility], [address], [place], [place] [address]    Report Status 05/14/2019 FINAL  Final  SARS CORONAVIRUS 2 (TAT 6-24 HRS) Nasopharyngeal Nasopharyngeal Swab     Status: None   Collection Time: 05/09/19  2:28 PM   Specimen: Nasopharyngeal Swab  Result Value Ref Range Status   SARS Coronavirus 2 NEGATIVE NEGATIVE Final    Comment: (NOTE) SARS-CoV-2 target nucleic acids are NOT DETECTED. The SARS-CoV-2 RNA is generally detectable in upper and lower respiratory specimens during the acute phase of infection. Negative results do not preclude SARS-CoV-2 infection, do not rule out co-infections with other pathogens, and should not be used as the sole basis for treatment or other patient management decisions. Negative results must be combined with clinical  observations, patient history, and epidemiological information. The expected result is Negative. Fact Sheet for Patients: SugarRoll.be Fact Sheet for Healthcare Providers: https://www.woods-mathews.com/ This test is not yet approved or cleared by the [place] FDA and  has been authorized for detection and/or diagnosis of SARS-CoV-2 by FDA under an Emergency Use Authorization (EUA). This EUA will remain  in effect (meaning this test can be used) for the duration of the COVID-19 declaration under Section 56 4(b)(1) of the Act, 21 U.S.C. section 360bbb-3(b)(1), unless the authorization is terminated or revoked sooner. Performed at [facility], [place] [address] [place], [place] [address]   MRSA PCR Screening     Status: None   Collection Time: 05/09/19  8:47 PM   Specimen: Nasal Mucosa; Nasopharyngeal  Result Value Ref Range Status   MRSA by PCR NEGATIVE NEGATIVE Final    Comment:        The GeneXpert MRSA Assay (FDA approved for NASAL specimens only), is one component of a comprehensive MRSA colonization surveillance program. It is not intended to diagnose MRSA infection nor to guide or monitor treatment for MRSA infections. Performed at [facility], [address], [place], [place] [address]     Studies/Results: Dg Chest Port 1 View  Result Date: 05/16/2019 CLINICAL DATA:  Status post intubation. EXAM: PORTABLE CHEST 1 VIEW COMPARISON:  Single-view of the chest 05/15/2019. FINDINGS: New endotracheal tube is in place with the tip in good position at the level of the clavicular heads. Right worse than left airspace disease has progressed since yesterday's examination. Heart size is upper normal. There are likely small bilateral pleural effusions. No pneumothorax. IMPRESSION: ETT in good position. Worsened bilateral airspace disease small pleural effusions. Electronically Signed   By: Inge Rise M.D.   On: 05/16/2019 11:57    Dg Chest [facility]  1 View  Result Date: 05/15/2019 CLINICAL DATA:  Dyspnea and sepsis EXAM: PORTABLE CHEST 1 VIEW COMPARISON:  Two days ago FINDINGS: Cardiomegaly and aortic tortuosity. Low volume chest with hazy density at the left more than right base. No pneumothorax or air bronchogram. Artifact from hardware. IMPRESSION: Low volume chest with hazy opacity at the bases correlating with atelectasis on most recent abdominal CT. Cardiomegaly. Electronically Signed   By: Monte Fantasia M.D.   On: 05/15/2019 07:28    Medications:  Prior to Admission:  Medications Prior to Admission  Medication Sig Dispense Refill Last Dose  . acetaminophen (TYLENOL) 325 MG tablet Take 650 mg by mouth every 6 (six) hours as needed for moderate pain.      Marland Kitchen apixaban (ELIQUIS) 5 MG TABS tablet Take 5 mg by mouth 2 (two) times daily.   05/09/2019 at 0900  . atorvastatin (LIPITOR) 10 MG tablet Take 10 mg by mouth daily.   05/09/2019 at 0900  . azithromycin (ZITHROMAX) 500 MG tablet Take 500 mg by mouth daily.   05/09/2019 at 0900  . budesonide-formoterol (SYMBICORT) 80-4.5 MCG/ACT inhaler Inhale 2 puffs into the lungs 2 (two) times daily.   05/09/2019 at 0900  . carboxymethylcellulose (REFRESH PLUS) 0.5 % SOLN Place 1 drop into the right eye 3 (three) times daily.    05/09/2019 at 0900  . erythromycin ophthalmic ointment Place into the left eye. appy 1 ribbon ophthalmic (eye) 3 times a day   05/09/2019 at 0900  . hydrochlorothiazide (HYDRODIURIL) 12.5 MG tablet Take 12.5 mg by mouth daily.   05/09/2019 at 0900  . ipratropium-albuterol (DUONEB) 0.5-2.5 (3) MG/3ML SOLN Take 3 mLs by nebulization every 6 (six) hours as needed.   05/09/2019 at 0900  . labetalol (NORMODYNE) 200 MG tablet Take 200 mg by mouth daily. For HTN   05/09/2019 at 0900  . loratadine (CLARITIN) 10 MG tablet Take 10 mg by mouth daily.   05/09/2019 at 0900  . methimazole (TAPAZOLE) 5 MG tablet Take 5 mg by mouth daily.   05/09/2019 at 0900  .  sennosides-docusate sodium (SENOKOT-S) 8.6-50 MG tablet Take 2 tablets by mouth 2 (two) times daily. For constipation   05/08/2019 at Unknown time  . umeclidinium bromide (INCRUSE ELLIPTA) 62.5 MCG/INH AEPB Inhale 1 puff into the lungs daily.   05/08/2019 at Unknown time  . valsartan (DIOVAN) 80 MG tablet Take 80 mg by mouth daily.   05/08/2019 at Unknown time   Scheduled: . atorvastatin  10 mg Oral Daily  . bisacodyl  10 mg Rectal QHS  . chlorhexidine gluconate (MEDLINE KIT)  15 mL Mouth Rinse BID  . Chlorhexidine Gluconate Cloth  6 each Topical Daily  . feeding supplement (PRO-STAT SUGAR FREE 64)  60 mL Per Tube BID  . feeding supplement (VITAL HIGH PROTEIN)  1,000 mL Per Tube Q24H  . free water  200 mL Per Tube Q6H  . heparin injection (subcutaneous)  5,000 Units Subcutaneous Q8H  . insulin aspart  0-5 Units Subcutaneous QHS  . insulin aspart  0-6 Units Subcutaneous TID WC  . ipratropium-albuterol  3 mL Nebulization TID  . labetalol  100 mg Oral BID  . mouth rinse  15 mL Mouth Rinse BID  . mouth rinse  15 mL Mouth Rinse 10 times per day  . methimazole  5 mg Oral Daily  . mometasone-formoterol  2 puff Inhalation BID  . multivitamin with minerals  1 tablet Per Tube Daily  . nitrofurantoin (macrocrystal-monohydrate)  100 mg Oral  Q12H  . polyethylene glycol  17 g Oral BID  . polyvinyl alcohol  1 drop Right Eye TID  . senna-docusate  2 tablet Oral BID  . sodium chloride flush  3 mL Intravenous Q12H   Continuous: . sodium chloride    . dextrose Stopped (05/16/19 0303)  . propofol (DIPRIVAN) infusion 25 mcg/kg/min (05/16/19 1244)   ICH:TVGVSY chloride, acetaminophen **OR** acetaminophen, albuterol, dextrose, fentaNYL (SUBLIMAZE) injection, fentaNYL (SUBLIMAZE) injection, food thickener, ondansetron **OR** ondansetron (ZOFRAN) IV, sennosides, sodium chloride flush, traZODone  Assesment: He was admitted with sepsis from pneumonia.  He has improved but then he underwent PEG tube placement  and because of his hypoxia and continued respiratory problems he was left intubated and he remains on the ventilator now.  He is not ready for extubation.  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   COPD (chronic obstructive pulmonary disease) ([place])   Hyperthyroidism   Dysphagia due to old cerebrovascular accident-on honey thickened liquids   Bil PNA (pneumonia)--suspect aspiration related   Hypernatremia   Acute hypoxic respiratory failure due to bilateral pneumonia   Diabetes mellitus with hyperglycemia--new diagnosis   AKI (acute kidney injury) ([place])   Goals of care, counseling/discussion   Palliative care encounter   Enterococcus faecalis UTI infection   Endotracheally intubated--- on ventilator   PEG (percutaneous endoscopic gastrostomy) status -Placed 05/15/2019    Plan: Continue ventilator care.  I think he will need another 2448 hrs. before he is ready for extubation.  Discussed with Dr. Denton Brick    LOS: 7 days   Alonza Bogus 05/16/2019, 1:48 PM

## 2019-05-16 NOTE — Progress Notes (Signed)
Initial Nutrition Assessment  DOCUMENTATION CODES:   Obesity unspecified  INTERVENTION:  -Recommend Initiating Vital High Protein @ 35 ml/hr (840 ml/day)  -Prostat 60 ml BID via PEG  -MVI with minerals daily - crushed via PEG  TF regimen and propofol at current rate providing 1588 total kcal/day (108 % of kcal needs), 134 grams protein, 706 ml free water from formula  Free water flushes per MD   NUTRITION DIAGNOSIS:   Inadequate oral intake related to acute illness, dysphagia(sepsis secondary to pneumonia; h/o stroke with residual right-sided hemiparesis;dysphagia) as evidenced by NPO status(s/p PEG placement).  GOAL:   Provide needs based on ASPEN/SCCM guidelines  MONITOR:   Vent status, Labs, Weight trends, I & O's, Skin, TF tolerance  REASON FOR ASSESSMENT:   Consult, Ventilator Enteral/tube feeding initiation and management  ASSESSMENT:  73 year old male with past medical history significant for prior stroke with residual right-sided hemiparesis and dysphagia, recurrent DVT/PE, hypothyroidism, HTN, recurrent episodes of sigmoid volvulus who presented from [facility] with hypoxia requiring nonrebreather bag and reported fevers. CXR with possible pneumonia; CT abdomen and pelvis with bilateral pneumonia and constipation with gaseous distention of proximal sigmoid.  Patient admitted for sepsis secondary to pneumonia.  11/24 PEG/abdominal binder in place  Per chart review, patient's brother (POA) decided to pursue PEG for free water/medications and reduce risk for aspiration. Patient previously on honey thickening liquids, will continue to monitor for respiratory status and diet advancement. Tube feed recommendations provided above.  Admit wt 106 kg; BLE mild pitting edema present on admission Current wt 104.6 kg; BLE and BUE moderate pitting edema per 11/24 RN assessment. Weights trending down during admission, current fluid status masking actual losses.   I/Os: +10954 ml  since admit     +396 ml x 24 hrs UOP: 1400 ml x 24 hrs Patient is currently intubated on ventilator support MV: 9.2 L/min Temp (24hrs), Avg:98.4 F (36.9 C), Min:97.7 F (36.5 C), Max:99.5 F (37.5 C)  Propofol: 13.18 ml/hr provides 348 kcal  Medications reviewed and include: SSI, Miralax, Senokot Propofol  Free water 200 ml every 6 hrs  Labs: CBGs 121-154 x 24 hrs, K 3.2 (L)  NUTRITION - FOCUSED PHYSICAL EXAM: Deferred  Diet Order:   Diet Order            Diet NPO time specified  Diet effective now              EDUCATION NEEDS:   No education needs have been identified at this time  Skin:  Skin Assessment: Reviewed RN Assessment(MASD; groin; incision;closed;abdomen)  Last BM:  11/23 (type 6; brown;small)  Height:   Ht Readings from Last 1 Encounters:  05/15/19 5\' 8"  (1.727 m)    Weight:   Wt Readings from Last 1 Encounters:  05/16/19 104.6 kg    Ideal Body Weight:  70 kg  BMI:  Body mass index is 35.06 kg/m.  Estimated Nutritional Needs:   Kcal:  9675-9163  Protein:  126-140  Fluid:  per MD   Lajuan Lines, RD, LDN Clinical Nutrition Office [phone] After Hours/Weekend Pager: [phone]

## 2019-05-16 NOTE — Discharge Instructions (Signed)
PEG Care (Feeding Tube Care) -Flush the tube twice a day with 20cc of water from the tap.  -Make sure the PEG remains at 3cm at the skin level (just below the bumper). It is important to keep it at this mark so that the tube is not too tight or too loose.  -Keep the tube taped down when not in use or with activity. -Wear an abdominal binder, or ace wrap around your waste to keep the tube in place, especially with sleeping.  -If your tube falls out or gets pulled out, go to the ED immediately. If it is within 6-8 weeks, there is a possibility that you will need surgery.   After 6-8 weeks, a new tube can be reinserted, but has to be done quickly because the hole heals up fast.  -You may shower and dry the area and tube off after showering. DO NOT SUBMERGE IN A BATH.

## 2019-05-16 NOTE — Progress Notes (Signed)
[facility]  Patient remains intubated. H&H stable. PEG In place at 3 cm at the skin.  Abdominal binder in place, PEG spins and abdomen soft. Has been receiving meds without issues.  If plan to hold off on feeds, then PEG to gravity bag until feeds started.  Can restart Eliquis as desired. H&H is stable and no signs of bleeding.  Abdominal binder at all times. Patient can follow up as needed with issues with the PEG.   Curlene Labrum, MD [facility] [address] [place], [place] [address] [phone]/ [phone] (office)

## 2019-05-16 NOTE — Progress Notes (Signed)
Patient Demographics:    [name] [name], is a 73 y.o. male, DOB - 1945-09-07, SNK:[id number]  Admit date - 05/09/2019   Admitting Physician [name] Foskey Denton Brick, MD  Outpatient Primary MD for the patient is [name] Duos, MD  LOS - 7  Chief Complaint  Patient presents with   Respiratory Distress      Subjective:    [name] [name] today had persistent respiratory distress, -Currently intubated and sedated after PEG placement procedure -Oxygen requirement improving FiO2 down to 50% -We will be starting tube feeding per dietitian recommendations  Assessment  & Plan :    Principal Problem:   Sepsis secondary to pneumonia Active Problems:   Long term current use of anticoagulant therapy-on Eliquis due to history of DVT/PE and strokes   Bil PNA (pneumonia)--suspect aspiration related   Hypernatremia   Acute hypoxic respiratory failure due to bilateral pneumonia   Endotracheally intubated--- on ventilator   Dysphagia due to 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, as well as history of hyperthyroidism, HTN and recurrent episodes of sigmoid volvulus admitted on 05/09/2019 from [facility] with hypoxia requiring nonrebreather bag, patient has  been unwell for the last couple of days with worsening respiratory status and reported fevers-imaging studies suggest bilateral pneumonia probably aspiration related, also found with severe hyperglycemia and severe hypernatremia without frank DKA -Continues to have issues with aspiration -Had PEG on 05/15/2019, remains intubated and sedated post-procedure -Remains intubated and sedated   A/p 1)-Sepsis secondary to Bilateral Pneumonia--suspect aspiration related in  the patient with dysphagia--- has penicillin allergy -Pharmacy consult appreciated -Completed cefepime and Flagyl  -c/n IVF -Patient met sepsis criteria on admission with transient hypotension requiring aggressive IV fluid boluses, tachypnea, hypoxia, leukocytosis and, reports of fever -COVID-19 test Neg  2)Bilateral Pneumonia--- suspect recurrent aspiration related,  antibiotics as above #1 -c/n bronchodilators and mucolytics -Repeat chest x-ray on 05/15/2019 noted --Had PEG on 05/15/2019, remains intubated and sedated post-procedure  3)Acute Hypoxic Respiratory Failure--- secondary to #2 above, treat as above #1 and #2, -Hypoxia and respiratory distress persistent --Had PEG on 05/15/2019, remains intubated and sedated post-procedure  4)New onset DM severe hyperglycemia without frank DKA--  --A1c 11.3 -Glycemic control continues to improve, Continue to hold Lantus for now as glycemic control is improved ,-okay to use sliding scale coverage -Continue IV fluids  5)HyperNatremia--sodium is down to 145 from 161,  - due to dehydration and free water deficit, continue IVF with half-normal saline - suspect significant free water deficit in the patient with dysphagia who is on honey thickened liquids and now with severe hyperglycemia and sepsis-- monitor serial BMP and adjust IV fluid rate and composition as indicated had PEG tube placement  on 05/15/2019 to allow for free water and medication administration -Okay to give free  water via PEG tube starting 05/15/2019  6)AKI----acute kidney injury due to severe dehydration in the setting of sepsis with transient hypotension as well as significant dehydration and volume depletion in a patient who is on honey thickened liquids-     creatinine on admission=2.85  ,   baseline creatinine = 1.0 ( 6 months PTA ), creatinine is down to 1.45 with hydration,  renally adjust medications, avoid nephrotoxic agents/dehydration/hypotension -Continue to Hold Diovan and HCTZ  7)History of prior stroke/DVT/PE---  hold Eliquis for now to allow for PEG tube placement -  8)Dysphagia--- patient with prior stroke with right hemiparesis and Dysphagia, PTA was on honey thickened liquids.  N.p.o. 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  -Discussed with patient's brother/POA -Mr [name] [name] at [phone]  -Plan of care and advanced directive discussed, patient is a full code  10)H/o Prior CVA with residual right sided hemiparesis and Dysphagia--- hold Eliquis to allow for PEG tube placement -continue Lipitor  11)Hyperthyroidism: -Continue methimazole, okay to restart labetalol  12)History of recurrent sigmoid volvulus and chronic constipation--CT abdomen and pelvis without evidence of volvulus or SBO at this time, laxatives as ordered  13) Enterococcus faecalis UTI--resistant to quinolones, patient is allergy to penicillins, treat empirically with Macrobid, watch renal function closely  14)FEN--give vital HP via PEG  tube along with water flushes, dietitian recommendations appreciated  CRITICAL CARE Performed by: Roxan Hockey   Total critical care time: 42 minutes  Critical care time was exclusive of separately billable procedures and treating other patients.  Critical care was necessary to treat or prevent imminent or life-threatening deterioration.  Ventilator Settings-  Fio2- 50%/PEEP 5/RR 18/TV 540  After reviewing ABG results I Dropped FiO2 down to 50% from 85%,  Critical care was time spent personally by me on the following activities: development of treatment plan with patient and/or surrogate as well as nursing, discussions with consultants, evaluation of patient's response to treatment, examination of patient, obtaining history from patient or surrogate, ordering and performing treatments and interventions, ordering and review of laboratory studies, ordering and review of radiographic studies, pulse oximetry and re-evaluation of patient's condition.   Disposition/Need for in-Hospital Stay- patient unable to be discharged at this time due to -sepsis in the setting of bilateral pneumonia requiring IV antibiotics and IV fluids with acute hypoxic respiratory failure requiring high flow oxygen -Had PEG on 05/15/2019, remains intubated and sedated post-procedure  Code Status : Full Code  Family Communication:  Discussed with patient's brother/POA -Mr Phillipe Clemon at [phone]--[id number] times during this hospitalization  Disposition Plan  : TBD  Antibiotic therapy Vancomycin 11/18>>11/18 Cefepime 11/18 >> 11/25 Flagyl 11/18>>11/25 Macrobid 11/20 >> 11/27  Consults  :  na  DVT Prophylaxis  : Okay to restart Eliquis- SCDs /   Lab Results  Component Value Date   PLT 177 05/16/2019    Inpatient Medications  Scheduled Meds:  atorvastatin  10 mg Oral Daily   bisacodyl  10 mg Rectal QHS   chlorhexidine gluconate (MEDLINE KIT)  15 mL Mouth Rinse BID   Chlorhexidine Gluconate  Cloth  6 each Topical Daily   feeding supplement (PRO-STAT SUGAR FREE 64)  60 mL Per Tube BID   feeding supplement (VITAL HIGH PROTEIN)  1,000 mL Per Tube Q24H   free water  200 mL Per Tube Q6H   heparin injection (subcutaneous)  5,000 Units Subcutaneous Q8H   insulin aspart  0-5 Units Subcutaneous QHS   insulin aspart  0-6 Units Subcutaneous TID WC   ipratropium-albuterol  3 mL Nebulization TID   labetalol  100 mg Oral BID   mouth rinse  15 mL Mouth Rinse BID   mouth rinse  15 mL Mouth Rinse 10 times per day   methimazole  5 mg Oral Daily   multivitamin with minerals  1 tablet Per Tube Daily   nitrofurantoin (macrocrystal-monohydrate)  100 mg Oral Q12H   polyethylene glycol  17 g Oral BID   polyvinyl alcohol  1 drop Right Eye TID   senna-docusate  2 tablet Oral BID   sodium chloride flush  3 mL Intravenous Q12H   Continuous Infusions:  sodium chloride     dextrose Stopped (05/16/19 0303)   propofol (DIPRIVAN) infusion 25 mcg/kg/min (05/16/19 1244)   PRN Meds:.sodium chloride, acetaminophen **OR** acetaminophen, albuterol, dextrose, fentaNYL (SUBLIMAZE) injection, fentaNYL (SUBLIMAZE) injection, food thickener, ondansetron **OR** ondansetron (ZOFRAN) IV, sennosides, sodium chloride flush, traZODone   Anti-infectives (From admission, onward)   Start     Dose/Rate Route Frequency Ordered Stop   05/15/19 0600  ciprofloxacin (CIPRO) IVPB 400 mg     400 mg 200 mL/hr over 60 Minutes Intravenous On call to O.R. 05/14/19 1951 05/15/19 0804   05/11/19 1600  vancomycin (VANCOCIN) 1,500 mg in sodium chloride 0.9 % 500 mL IVPB  Status:  Discontinued     1,500 mg 250 mL/hr over 120 Minutes Intravenous Every 48 hours 05/09/19 1704 05/10/19 0809   05/11/19 1130  nitrofurantoin (macrocrystal-monohydrate) (MACROBID) capsule 100 mg     100 mg Oral Every 12 hours 05/11/19 1125 05/18/19 0959   05/10/19 1400  ceFEPIme (MAXIPIME) 2 g in sodium chloride 0.9 % 100 mL IVPB  Status:   Discontinued     2 g 200 mL/hr over 30 Minutes Intravenous Every 24 hours 05/09/19 1704 05/10/19 0813   05/10/19 1000  ceFEPIme (MAXIPIME) 2 g in sodium chloride 0.9 % 100 mL IVPB  Status:  Discontinued     2 g 200 mL/hr over 30 Minutes Intravenous Every 12 hours 05/10/19 0813 05/16/19 0813   05/09/19 1900  metroNIDAZOLE (FLAGYL) IVPB 500 mg  Status:  Discontinued     500 mg 100 mL/hr over 60 Minutes Intravenous Every 8 hours 05/09/19 1842 05/16/19 0813   05/09/19 1400  vancomycin (VANCOCIN) IVPB 1000 mg/200 mL premix     1,000 mg 200 mL/hr over 60 Minutes Intravenous Every 1 hr x 2 05/09/19 1302 05/09/19 1643   05/09/19 1300  vancomycin (VANCOCIN) IVPB 1000 mg/200 mL premix  Status:  Discontinued     1,000 mg 200 mL/hr over 60 Minutes Intravenous  Once 05/09/19 1256 05/09/19 1302   05/09/19 1300  ceFEPIme (MAXIPIME) 2 g in sodium chloride 0.9 % 100 mL IVPB     2 g 200 mL/hr over 30 Minutes Intravenous  Once 05/09/19 1256 05/09/19 1349        Objective:   Vitals:   05/16/19 1600 05/16/19 1624 05/16/19 1651 05/16/19 1700  BP: 121/77   129/82  Pulse: 80 80  78  Resp: 18 18  18   Temp:  99.5 F (37.5 C)    TempSrc:  Axillary    SpO2: 95% 95% 94% 95%  Weight:      Height:        Wt Readings from Last 3 Encounters:  05/16/19 104.6 kg  05/13/19 106.1 kg  05/08/19 106.4 kg     Intake/Output Summary (Last 24 hours) at 05/16/2019 1838 Last data filed at 05/16/2019 1755 Gross per 24 hour  Intake 2397.65 ml  Output 2650 ml  Net -252.35 ml   Physical Exam  Gen:-Intubated and sedated HEENT:- [place].AT, No sclera icterus  Mouth-ET tube Neck-Supple Neck,No JVD,.  Lungs-fair air movement, sounds congested CV- S1, S2 normal, regular  Abd-  +ve B.Sounds, Abd Soft,  , soft umbilical hernia Extremity/Skin:-  , pedal pulses present  Psych-intubated and sedated  neuro-Limited exam as patient is intubated and sedated, prior to intubation patient had residual right-sided hemiparesis,  residual facial droop with residual dysarthria from prior stroke, no new focal deficits, no tremors   Data Review:   Micro Results Recent Results (from the past 240 hour(s))  Urine culture     Status: Abnormal   Collection Time: 05/09/19 12:55 PM   Specimen: In/Out Cath Urine  Result Value Ref Range Status   Specimen Description   Final    IN/OUT CATH URINE Performed at [facility], [address], [place], [place] [address]    Special Requests   Final    NONE Performed at [facility], [address], [place], [place] [address]    Culture >=100,000 COLONIES/mL ENTEROCOCCUS FAECALIS (A)  Final   Report Status 05/11/2019 FINAL  Final   Organism ID, Bacteria ENTEROCOCCUS FAECALIS (A)  Final      Susceptibility   Enterococcus faecalis - MIC*    AMPICILLIN <=2 SENSITIVE Sensitive     LEVOFLOXACIN >=8 RESISTANT Resistant     NITROFURANTOIN <=16 SENSITIVE Sensitive     VANCOMYCIN 1 SENSITIVE Sensitive     * >=100,000 COLONIES/mL ENTEROCOCCUS FAECALIS  Blood Culture (routine x 2)     Status: None   Collection Time: 05/09/19  1:10 PM   Specimen: BLOOD RIGHT HAND  Result Value Ref Range Status   Specimen Description BLOOD RIGHT HAND  Final   Special Requests   Final    BOTTLES DRAWN AEROBIC AND ANAEROBIC Blood Culture adequate volume   Culture   Final    NO GROWTH 5 DAYS Performed at [facility], [address] [place], [place] [address]    Report Status 05/14/2019 FINAL  Final  Blood Culture (routine x 2)     Status: None   Collection Time: 05/09/19  1:10 PM   Specimen: BLOOD LEFT HAND  Result Value Ref Range Status   Specimen Description BLOOD LEFT HAND  Final   Special Requests   Final    BOTTLES DRAWN AEROBIC AND ANAEROBIC Blood Culture adequate volume   Culture   Final    NO GROWTH 5 DAYS Performed at [facility], [address], [place], [place] [address]    Report Status 05/14/2019 FINAL  Final  SARS CORONAVIRUS 2 (TAT 6-24 HRS) Nasopharyngeal Nasopharyngeal Swab      Status: None   Collection Time: 05/09/19  2:28 PM   Specimen: Nasopharyngeal Swab  Result Value Ref Range Status   SARS Coronavirus 2 NEGATIVE NEGATIVE Final    Comment: (NOTE) SARS-CoV-2 target nucleic acids are NOT DETECTED. The SARS-CoV-2 RNA is generally detectable in upper and  lower respiratory specimens during the acute phase of infection. Negative results do not preclude SARS-CoV-2 infection, do not rule out co-infections with other pathogens, and should not be used as the sole basis for treatment or other patient management decisions. Negative results must be combined with clinical observations, patient history, and epidemiological information. The expected result is Negative. Fact Sheet for Patients: SugarRoll.be Fact Sheet for Healthcare Providers: https://www.woods-mathews.com/ This test is not yet approved or cleared by the [place] FDA and  has been authorized for detection and/or diagnosis of SARS-CoV-2 by FDA under an Emergency Use Authorization (EUA). This EUA will remain  in effect (meaning this test can be used) for the duration of the COVID-19 declaration under Section 56 4(b)(1) of the Act, 21 U.S.C. section 360bbb-3(b)(1), unless the authorization is terminated or revoked sooner. Performed at [facility], [place] [address] [place], [place] [address]   MRSA PCR Screening     Status: None   Collection Time: 05/09/19  8:47 PM   Specimen: Nasal Mucosa; Nasopharyngeal  Result Value Ref Range Status   MRSA by PCR NEGATIVE NEGATIVE Final    Comment:        The GeneXpert MRSA Assay (FDA approved for NASAL specimens only), is one component of a comprehensive MRSA colonization surveillance program. It is not intended to diagnose MRSA infection nor to guide or monitor treatment for MRSA infections. Performed at [facility], [address], [place], [place] [address]    Radiology Reports Ct Abdomen Pelvis Wo  Contrast  Result Date: 05/09/2019 CLINICAL DATA:  Abdominal distension. Possible volvulus. EXAM: CT ABDOMEN AND PELVIS WITHOUT CONTRAST TECHNIQUE: Multidetector CT imaging of the abdomen and pelvis was performed following the standard protocol without IV contrast. COMPARISON:  11/03/2016 FINDINGS: Lower chest: Motion artifact through the lung bases with consolidative opacities in the lower lobes and to a lesser extent posterior lingula and right middle lobe. No pleural effusion. Coronary atherosclerosis. Hepatobiliary: 2 cm cyst inferiorly in the right hepatic lobe, mildly enlarged from 2018. Contracted gallbladder. No biliary dilatation. Pancreas: Unremarkable. Spleen: Unremarkable. Adrenals/Urinary Tract: Unremarkable adrenal glands. Unchanged small calcifications in both renal hila which may represent vascular calcifications or non-obstructing calculi. No hydroureteronephrosis. Unremarkable bladder. Stomach/Bowel: The stomach is collapsed. There is no small bowel dilatation. There is a moderately large amount of stool in the distal sigmoid colon and rectum. The more proximal sigmoid colon is redundant and moderately distended by gas. An ordinary amount of stool is present in the more proximal colon, which is nondilated. Left-sided colonic diverticulosis is noted without evidence of diverticulitis. The appendix is unremarkable. Vascular/Lymphatic: Aortic atherosclerosis without aneurysm. No enlarged lymph nodes. Reproductive: Unremarkable prostate. Other: No ascites or pneumoperitoneum. Fat containing paraumbilical hernia. Musculoskeletal: No acute osseous abnormality or suspicious osseous lesion. Mild lumbar levoscoliosis. Advanced lumbar spondylosis with interbody and facet ankylosis at L4-5. IMPRESSION: 1. Moderately large amount of stool in the distal sigmoid colon and rectum. Moderate gaseous distension of the more proximal sigmoid colon without evidence of volvulus or bowel obstruction. 2. Bibasilar lung  consolidation concerning for pneumonia. 3. Aortic Atherosclerosis (ICD10-I70.0). Electronically Signed   By: Logan Bores M.D.   On: 05/09/2019 15:45   Dg Chest Port 1 View  Result Date: 05/16/2019 CLINICAL DATA:  Status post intubation. EXAM: PORTABLE CHEST 1 VIEW COMPARISON:  Single-view of the chest 05/15/2019. FINDINGS: New endotracheal tube is in place with the tip in good position at the level of the clavicular heads. Right worse than left airspace disease  has progressed since yesterday's examination. Heart size is upper normal. There are likely small bilateral pleural effusions. No pneumothorax. IMPRESSION: ETT in good position. Worsened bilateral airspace disease small pleural effusions. Electronically Signed   By: Inge Rise M.D.   On: 05/16/2019 11:57   Dg Chest Port 1 View  Result Date: 05/15/2019 CLINICAL DATA:  Dyspnea and sepsis EXAM: PORTABLE CHEST 1 VIEW COMPARISON:  Two days ago FINDINGS: Cardiomegaly and aortic tortuosity. Low volume chest with hazy density at the left more than right base. No pneumothorax or air bronchogram. Artifact from hardware. IMPRESSION: Low volume chest with hazy opacity at the bases correlating with atelectasis on most recent abdominal CT. Cardiomegaly. Electronically Signed   By: Monte Fantasia M.D.   On: 05/15/2019 07:28   Dg Chest Port 1 View  Result Date: 05/13/2019 CLINICAL DATA:  Sepsis. EXAM: PORTABLE CHEST 1 VIEW COMPARISON:  05/09/2019 FINDINGS: 0528 hours. Lordotic position. Low lung volumes. The cardio pericardial silhouette is enlarged. There is pulmonary vascular congestion without overt pulmonary edema. Bibasilar collapse/consolidation again noted, left greater than right. IMPRESSION: Cardiomegaly with low lung volumes and bibasilar collapse/consolidation, left greater than right. Electronically Signed   By: Misty Stanley M.D.   On: 05/13/2019 07:13   Dg Chest Port 1 View  Result Date: 05/09/2019 CLINICAL DATA:  Shortness of breath.   Hypoxia. EXAM: PORTABLE CHEST 1 VIEW COMPARISON:  January 16, 2019. FINDINGS: Stable cardiomediastinal silhouette. No pneumothorax is noted. Hypoinflation of the lungs is noted. Mild bibasilar atelectasis or infiltrates are noted. Bony thorax is unremarkable. IMPRESSION: Mild bibasilar subsegmental atelectasis or infiltrates are noted. Electronically Signed   By: Marijo Conception M.D.   On: 05/09/2019 13:26    CBC Recent Labs  Lab 05/10/19 0451 05/11/19 0408 05/13/19 0520 05/15/19 0549 05/16/19 0423  WBC 10.7* 11.1* 8.6 8.2 8.0  HGB 15.7 15.0 14.2 12.9* 12.5*  HCT 54.4* 52.3* 48.2 42.2 41.0  PLT 221 210 166 165 177  MCV 106.9* 107.2* 104.6* 100.2* 99.5  MCH 30.8 30.7 30.8 30.6 30.3  MCHC 28.9* 28.7* 29.5* 30.6 30.5  RDW 14.3 14.3 14.0 13.3 13.2  LYMPHSABS  --   --   --  1.7  --   MONOABS  --   --   --  0.5  --   EOSABS  --   --   --  0.2  --   BASOSABS  --   --   --  0.0  --     Chemistries  Recent Labs  Lab 05/12/19 1549 05/13/19 0520 05/14/19 0428 05/15/19 0549 05/16/19 0423 05/16/19 1033 05/16/19 1707  NA 155* 152* 147* 143 145  --   --   K 3.8 3.5 3.1* 3.5 3.2*  --   --   CL 122* 119* 113* 110 110  --   --   CO2 27 26 26 26 27   --   --   GLUCOSE 132* 205* 187* 178* 155*  --   --   BUN 28* 25* 18 15 14   --   --   CREATININE 1.37* 1.35* 1.37* 1.37* 1.42*  --   --   CALCIUM 7.7* 7.7* 7.6* 7.5* 7.6*  --   --   MG  --   --   --   --   --  1.9 2.0   ------------------------------------------------------------------------------------------------------------------ Recent Labs    05/16/19 0423  TRIG 98    Lab Results  Component Value Date   HGBA1C 11.3 (H) 05/09/2019   ------------------------------------------------------------------------------------------------------------------  No results for input(s): TSH, T4TOTAL, T3FREE, THYROIDAB in the last 72 hours.  Invalid input(s):  FREET3 ------------------------------------------------------------------------------------------------------------------ No results for input(s): VITAMINB12, FOLATE, FERRITIN, TIBC, IRON, RETICCTPCT in the last 72 hours.  Coagulation profile Recent Labs  Lab 05/14/19 1531  INR 1.4*    No results for input(s): DDIMER in the last 72 hours.  Cardiac Enzymes No results for input(s): CKMB, TROPONINI, MYOGLOBIN in the last 168 hours.  Invalid input(s): CK   ------------------------------------------------------------------------------------------------------------------    Component Value Date/Time   BNP 18.0 02/21/2017 0730    Roxan Hockey M.D on 05/16/2019 at 6:38 PM  Go to [email or web address] - for contact info  [facility] - Office  [phone]

## 2019-05-17 ENCOUNTER — Inpatient Hospital Stay (HOSPITAL_COMMUNITY): Payer: Medicare Other

## 2019-05-17 LAB — TRIGLYCERIDES: Triglycerides: 125 mg/dL (ref <150)

## 2019-05-17 LAB — BLOOD GAS, ARTERIAL
Acid-Base Excess: 5.9 mmol/L — ABNORMAL HIGH (ref 0.0–2.0)
Bicarbonate: 29.4 mmol/L — ABNORMAL HIGH (ref 20.0–28.0)
FIO2: 50
O2 Saturation: 91.6 %
Patient temperature: 37.4
pCO2 arterial: 43.8 mmHg (ref 32.0–48.0)
pH, Arterial: 7.449 (ref 7.350–7.450)
pO2, Arterial: 59.4 mmHg — ABNORMAL LOW (ref 83.0–108.0)

## 2019-05-17 LAB — GLUCOSE, CAPILLARY
Glucose-Capillary: 164 mg/dL — ABNORMAL HIGH (ref 70–99)
Glucose-Capillary: 168 mg/dL — ABNORMAL HIGH (ref 70–99)
Glucose-Capillary: 168 mg/dL — ABNORMAL HIGH (ref 70–99)
Glucose-Capillary: 169 mg/dL — ABNORMAL HIGH (ref 70–99)
Glucose-Capillary: 176 mg/dL — ABNORMAL HIGH (ref 70–99)
Glucose-Capillary: 182 mg/dL — ABNORMAL HIGH (ref 70–99)

## 2019-05-17 LAB — PHOSPHORUS
Phosphorus: 2 mg/dL — ABNORMAL LOW (ref 2.5–4.6)
Phosphorus: 2.2 mg/dL — ABNORMAL LOW (ref 2.5–4.6)

## 2019-05-17 LAB — MAGNESIUM
Magnesium: 2 mg/dL (ref 1.7–2.4)
Magnesium: 1.9 mg/dL (ref 1.7–2.4)

## 2019-05-17 MED ORDER — MAGNESIUM SULFATE 2 GM/50ML IV SOLN: 2.0000 g | Freq: Once | INTRAVENOUS | Status: DC

## 2019-05-17 MED ORDER — FREE WATER
200.0000 mL | Status: DC
  Administered 2019-05-17 – 2019-05-23 (×35): 200 mL

## 2019-05-17 MED ORDER — LANSOPRAZOLE 3 MG/ML SUSP: 15.0000 mg | Freq: Two times a day (BID) | ORAL | Status: DC

## 2019-05-17 MED ORDER — POTASSIUM CHLORIDE 10 MEQ/100ML IV SOLN: 10.0000 meq | INTRAVENOUS | Status: DC

## 2019-05-17 MED ORDER — FUROSEMIDE 10 MG/ML IJ SOLN
40.0000 mg | Freq: Once | INTRAMUSCULAR | Status: AC
  Administered 2019-05-17: 40 mg via INTRAVENOUS
  Filled 2019-05-17: qty 4

## 2019-05-17 MED ORDER — PANTOPRAZOLE SODIUM 40 MG PO PACK
20.0000 mg | PACK | Freq: Two times a day (BID) | ORAL | Status: DC
  Administered 2019-05-17 (×2): 20 mg
  Filled 2019-05-17: qty 20

## 2019-05-17 MED ORDER — INSULIN ASPART 100 UNIT/ML ~~LOC~~ SOLN
0.0000 [IU] | SUBCUTANEOUS | Status: DC
  Administered 2019-05-17: 1 [IU] via SUBCUTANEOUS
  Administered 2019-05-18 (×4): 2 [IU] via SUBCUTANEOUS
  Administered 2019-05-18: 3 [IU] via SUBCUTANEOUS
  Administered 2019-05-19: 08:00:00 1 [IU] via SUBCUTANEOUS
  Administered 2019-05-19 (×6): 2 [IU] via SUBCUTANEOUS
  Administered 2019-05-20 (×3): 1 [IU] via SUBCUTANEOUS
  Administered 2019-05-20: 2 [IU] via SUBCUTANEOUS
  Administered 2019-05-20 – 2019-05-21 (×3): 1 [IU] via SUBCUTANEOUS
  Administered 2019-05-21: 2 [IU] via SUBCUTANEOUS
  Administered 2019-05-21: 1 [IU] via SUBCUTANEOUS
  Administered 2019-05-21: 05:00:00 2 [IU] via SUBCUTANEOUS
  Administered 2019-05-21: 1 [IU] via SUBCUTANEOUS
  Administered 2019-05-22 (×3): 2 [IU] via SUBCUTANEOUS
  Administered 2019-05-22 (×2): 1 [IU] via SUBCUTANEOUS
  Administered 2019-05-22: 2 [IU] via SUBCUTANEOUS
  Administered 2019-05-22: 3 [IU] via SUBCUTANEOUS
  Administered 2019-05-23 (×3): 2 [IU] via SUBCUTANEOUS

## 2019-05-17 NOTE — Progress Notes (Signed)
Palliative:  HPI: 73 y.o. male  with past medical history of stroke with residual dysphagia and right sided hemiparesis, on Eliquis, fatty liver, HTN, h/o PE, aspiration pneumonia, sigmoid volvulus admitted on 05/09/2019 with respiratory distress with bilateral pneumonia. PEG placed 05/15/19 and he has remained vent dependent since procedure.   I met today at Mr. Muscat bedside. He is alert on vent. He nods his head yes/no appropriately. He denies pain or discomfort. I reassured him that we are trying to get him stronger to come off the vent and get rid of that tube and we know it is uncomfortably. He is tolerating vent/ETT very well.   I called and spoke with brother, [name]. I reported my above conversation/assessment to [name]. He is appreciative of all the care his brother has received. He is very hopeful for improvement and tells me that his brother has been through so much already but he is tough and hoping he will have "a lot of improvement." I explained that this is the plan and we are supporting him in every way that we can. I also explained that we are hopeful that he will be able to come off the ventilator in the next few days. I did briefly mention the limitations of intubation and time limit before more difficult decisions may come up if he continues to be unable to wean. [name] continues to be hopeful it will not come to this. All questions/concerns addressed. Emotional support provided.   Exam: Alert, on vent and tolerating well. FiO2 50%, ETT in place. No distress. HR regular, rate and rhythm. Abd soft, +PEG. Generalized weakness and right-sided hemiparesis.   Plan: - Continue full aggressive care.  - Family hopeful for successful extubation over next few days.   [place], NP Palliative Medicine Team Pager [phone] (Please see [email or web address] for schedule) Team Phone [phone]    Greater than 50%  of this time was spent counseling and coordinating care related to the  above assessment and plan

## 2019-05-17 NOTE — Progress Notes (Signed)
Patient Demographics:    [name] [name], is a 73 y.o. male, DOB - December 04, 1945, BVQ:[id number]  Admit date - 05/09/2019   Admitting Physician Robynn Marcel Denton Brick, MD  Outpatient Primary MD for the patient is [name], MD  LOS - 8  Chief Complaint  Patient presents with   Respiratory Distress      Subjective:    [name] [name] today had persistent respiratory distress, -Currently intubated and sedated after PEG placement procedure -Did not do well with CPAP trial (had Apnea--even with a pressure of 18)---  Assessment  & Plan :    Principal Problem:   Sepsis secondary to pneumonia Active Problems:   Long term current use of anticoagulant therapy-on Eliquis due to history of DVT/PE and strokes   Bil PNA (pneumonia)--suspect aspiration related   Hypernatremia   Acute hypoxic respiratory failure due to bilateral pneumonia   Endotracheally intubated--- on ventilator   Dysphagia 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, as well as history of hyperthyroidism, HTN and recurrent episodes of sigmoid volvulus admitted on 05/09/2019 from [facility] with hypoxia requiring nonrebreather bag, patient has been unwell for the last couple of  days with worsening respiratory status and reported fevers-imaging studies suggest bilateral pneumonia probably aspiration related, also found with severe hyperglycemia and severe hypernatremia without frank DKA -Continues to have issues with aspiration -Had PEG on 05/15/2019, remains intubated and sedated post-procedure -Remains intubated and sedated   A/p 1)-Sepsis secondary to Bilateral Pneumonia--suspect aspiration related in  the patient with dysphagia--- has penicillin allergy -Pharmacy consult appreciated -Completed cefepime and Flagyl  -c/n IVF -Patient met sepsis criteria on admission with transient hypotension requiring aggressive IV fluid boluses, tachypnea, hypoxia, leukocytosis and, reports of fever -COVID-19 test Neg  2)Bilateral Pneumonia--- suspect recurrent aspiration related,  antibiotics as above #1 -c/n bronchodilators and mucolytics --Had PEG on 05/15/2019, remains intubated and sedated post-procedure  3)Acute Hypoxic Respiratory Failure--- secondary to #2 above, treat as above #1 and #2, -Hypoxia and respiratory distress persistent --Had PEG on 05/15/2019, remains intubated and sedated post-procedure -Chest x-ray from 05/17/2019 noted with bilateral pleural effusions, will give 1 dose of IV Lasix, stop IV fluids -Patient did not tolerate vent weaning well today, with CPAP trial became apneic despite a pressure of 18  4)New onset DM2 severe hyperglycemia without frank DKA--  --A1c 11.3 -Glycemic control continues to improve, Continue to hold Lantus for now as glycemic control is improved ,-okay to use sliding scale coverage  5)HyperNatremia--sodium is down to 145 from 161,  - due to dehydration and free water deficit, continue IVF with half-normal saline - suspect significant free water deficit in the patient with dysphagia who is on  honey thickened liquids and now with severe hyperglycemia and sepsis-- monitor serial BMP and adjust IV fluid rate and composition  as indicated had PEG tube placement on 05/15/2019 to allow for free water and medication administration -c/n  free water via PEG tube starting 05/15/2019  6)AKI----acute kidney injury due to severe dehydration in the setting of sepsis with transient hypotension as well as significant dehydration and volume depletion in a patient who is on honey thickened liquids-     creatinine on admission=2.85  ,   baseline creatinine = 1.0 ( 6 months PTA ), creatinine is down to 1.45 with hydration,  renally adjust medications, avoid nephrotoxic agents/dehydration/hypotension -Continue to Hold Diovan and HCTZ  7)History of prior stroke/DVT/PE---  hold Eliquis for now to allow for PEG tube placement -  8)Dysphagia--- patient with prior stroke with right hemiparesis and Dysphagia, PTA was on honey thickened liquids.  N.p.o. for now due to respiratory distress requiring high flow oxygen --Patient with dehydration, AKI  with severe hypernatremia due to free water deficit in the setting of honey thickened liquid/dietary limitations -Patient also having aspiration pneumonia -Patient and his POA/brother [name] have  decided to pursue  PEG/G-tube--so he can have free water administered and reduce risk for aspiration as well as AKI/dehydration/hypernatremia -Continue vital HP PEG tube feeding currently at 35 mL an hour via PEG tube, continue water flushes  9)Social/Ethics- Patient is a poor historian due to dysarthria from prior stroke  -Discussed with patient's brother/POA -[name] at [phone]  -Plan of care and advanced directive discussed, patient is a full code  10)H/o Prior CVA with residual right sided hemiparesis and Dysphagia--- hold Eliquis to allow for PEG tube placement -continue Lipitor  11)Hyperthyroidism: -Continue methimazole, okay to restart labetalol  12)History of recurrent sigmoid volvulus and chronic constipation--CT abdomen and pelvis without evidence of volvulus or SBO at this  time, laxatives as ordered  13) Enterococcus faecalis UTI--resistant to quinolones, patient is allergy to penicillins, treat empirically with Macrobid, watch renal function closely  CRITICAL CARE Performed by: Roxan Hockey   Total critical care time: 41 minutes  Critical care time was exclusive of separately billable procedures and treating other patients.  Critical care was necessary to treat or prevent imminent or life-threatening deterioration.  Ventilator Settings-  Fio2- 50%/PEEP 5/RR 18/TV 540  -ABG reviewed  -Patient did not tolerate vent weaning well today, with CPAP trial became apneic despite a pressure of 18  Critical care was time spent personally by me on the following activities: development of treatment plan with patient and/or surrogate as well as nursing, discussions with consultants, evaluation of patient's response to treatment, examination of patient, obtaining history from patient or surrogate, ordering and performing treatments and interventions, ordering and review of laboratory studies, ordering and review of radiographic studies, pulse oximetry and re-evaluation of patient's condition.   Disposition/Need for in-Hospital Stay- patient unable to be discharged at this time due to -sepsis in the setting of bilateral pneumonia requiring IV antibiotics and IV fluids with acute hypoxic respiratory failure requiring high flow oxygen -Had PEG on 05/15/2019, remains intubated and sedated post-procedure --Patient did not tolerate vent weaning well today, with CPAP trial became apneic despite a pressure of 18  Code Status : Full Code  Family Communication:  Discussed with patient's brother/POA -Mr Jamarr Treinen at [phone]--[id number] times during this hospitalization  Disposition Plan  : TBD  Antibiotic therapy Vancomycin 11/18>>11/18 Cefepime 11/18 >> 11/25 Flagyl 11/18>>11/25 Macrobid 11/20 >> 11/27  Consults  :  na  DVT Prophylaxis  : Eliquis- SCDs /    Lab Results  Component Value Date   PLT 177 05/16/2019    Inpatient Medications  Scheduled Meds:  apixaban  5 mg Oral BID   atorvastatin  10 mg Oral Daily   bisacodyl  10 mg Rectal QHS   chlorhexidine gluconate (MEDLINE KIT)  15 mL Mouth Rinse BID   Chlorhexidine Gluconate Cloth  6 each Topical Daily   feeding supplement (PRO-STAT SUGAR FREE 64)  60 mL Per Tube BID   feeding supplement (VITAL HIGH PROTEIN)  1,000 mL Per Tube Q24H   free water  200 mL Per Tube Q4H   furosemide  40 mg Intravenous Once   insulin aspart  0-5 Units Subcutaneous QHS   insulin aspart  0-6 Units Subcutaneous TID WC   ipratropium-albuterol  3 mL Nebulization TID   labetalol  100 mg Oral BID   lansoprazole  15 mg Per Tube BID   mouth rinse  15 mL Mouth Rinse BID   mouth rinse  15 mL Mouth Rinse 10 times per day   methimazole  5 mg Oral Daily   multivitamin with minerals  1 tablet Per Tube Daily   nitrofurantoin (macrocrystal-monohydrate)  100 mg Oral Q12H   polyethylene glycol  17 g Oral BID   polyvinyl alcohol  1 drop Right Eye TID   senna-docusate  2 tablet Oral BID   sodium chloride flush  3 mL Intravenous Q12H   Continuous Infusions:  sodium chloride     propofol (DIPRIVAN) infusion 20 mcg/kg/min (05/17/19 1143)   PRN Meds:.sodium chloride, acetaminophen **OR** acetaminophen, albuterol, dextrose, fentaNYL (SUBLIMAZE) injection, fentaNYL (SUBLIMAZE) injection, food thickener, ondansetron **OR** ondansetron (ZOFRAN) IV, sennosides, sodium chloride flush, traZODone   Anti-infectives (From admission, onward)   Start     Dose/Rate Route Frequency Ordered Stop   05/15/19 0600  ciprofloxacin (CIPRO) IVPB 400 mg     400 mg 200 mL/hr over 60 Minutes Intravenous On call to O.R. 05/14/19 1951 05/15/19 0804   05/11/19 1600  vancomycin (VANCOCIN) 1,500 mg in sodium chloride 0.9 % 500 mL IVPB  Status:  Discontinued     1,500 mg 250 mL/hr over 120 Minutes Intravenous Every 48  hours 05/09/19 1704 05/10/19 0809   05/11/19 1130  nitrofurantoin (macrocrystal-monohydrate) (MACROBID) capsule 100 mg     100 mg Oral Every 12 hours 05/11/19 1125 05/18/19 0959   05/10/19 1400  ceFEPIme (MAXIPIME) 2 g in sodium chloride 0.9 % 100 mL IVPB  Status:  Discontinued     2 g 200 mL/hr over 30 Minutes Intravenous Every 24 hours 05/09/19 1704 05/10/19 0813   05/10/19 1000  ceFEPIme (MAXIPIME) 2 g in sodium chloride 0.9 % 100 mL IVPB  Status:  Discontinued     2 g 200 mL/hr over 30 Minutes Intravenous Every 12 hours 05/10/19 0813 05/16/19 0813   05/09/19 1900  metroNIDAZOLE (FLAGYL) IVPB 500 mg  Status:  Discontinued     500 mg 100 mL/hr over 60 Minutes Intravenous Every 8 hours 05/09/19 1842 05/16/19 0813   05/09/19 1400  vancomycin (VANCOCIN) IVPB 1000 mg/200 mL premix     1,000 mg 200 mL/hr over 60 Minutes Intravenous Every 1 hr x 2 05/09/19 1302 05/09/19 1643   05/09/19 1300  vancomycin (VANCOCIN) IVPB 1000 mg/200 mL premix  Status:  Discontinued     1,000 mg 200 mL/hr over 60 Minutes Intravenous  Once 05/09/19 1256 05/09/19 1302   05/09/19 1300  ceFEPIme (MAXIPIME)  2 g in sodium chloride 0.9 % 100 mL IVPB     2 g 200 mL/hr over 30 Minutes Intravenous  Once 05/09/19 1256 05/09/19 1349        Objective:   Vitals:   05/17/19 1100 05/17/19 1142 05/17/19 1200 05/17/19 1300  BP: 136/82  (!) 148/87 140/81  Pulse: 79  77 81  Resp: _0 Temp:  99.6 F (37.6 C)    TempSrc:  Axillary    SpO2: 92%  92% 94%  Weight:      Height:        Wt Readings from Last 3 Encounters:  05/17/19 105.1 kg  05/13/19 106.1 kg  05/08/19 106.4 kg     Intake/Output Summary (Last 24 hours) at 05/17/2019 1334 Last data filed at 05/17/2019 1143 Gross per 24 hour  Intake 2650.84 ml  Output 1600 ml  Net 1050.84 ml   Physical Exam  Gen:-Intubated and sedated HEENT:- [place].AT, No sclera icterus  Mouth-ET tube Neck-Supple Neck,No JVD,.  Lungs-fair air movement, sounds congested CV-  S1, S2 normal, regular  Abd-  +ve B.Sounds, Abd Soft,  , soft umbilical hernia, PEG tube in situ Extremity/Skin:-  , pedal pulses present  Psych-intubated and sedated  neuro-Limited exam as patient is intubated and sedated, prior to intubation patient had residual right-sided hemiparesis, residual facial droop with residual dysarthria from prior stroke, no new focal deficits, no tremors   Data Review:   Micro Results Recent Results (from the past 240 hour(s))  Urine culture     Status: Abnormal   Collection Time: 05/09/19 12:55 PM   Specimen: In/Out Cath Urine  Result Value Ref Range Status   Specimen Description   Final    IN/OUT CATH URINE Performed at [facility], [address], [place], [place] [address]    Special Requests   Final    NONE Performed at [facility], [address], [place], [place] [address]    Culture >=100,000 COLONIES/mL ENTEROCOCCUS FAECALIS (A)  Final   Report Status 05/11/2019 FINAL  Final   Organism ID, Bacteria ENTEROCOCCUS FAECALIS (A)  Final      Susceptibility   Enterococcus faecalis - MIC*    AMPICILLIN <=2 SENSITIVE Sensitive     LEVOFLOXACIN >=8 RESISTANT Resistant     NITROFURANTOIN <=16 SENSITIVE Sensitive     VANCOMYCIN 1 SENSITIVE Sensitive     * >=100,000 COLONIES/mL ENTEROCOCCUS FAECALIS  Blood Culture (routine x 2)     Status: None   Collection Time: 05/09/19  1:10 PM   Specimen: BLOOD RIGHT HAND  Result Value Ref Range Status   Specimen Description BLOOD RIGHT HAND  Final   Special Requests   Final    BOTTLES DRAWN AEROBIC AND ANAEROBIC Blood Culture adequate volume   Culture   Final    NO GROWTH 5 DAYS Performed at [facility], [address], [place], [place] [address]    Report Status 05/14/2019 FINAL  Final  Blood Culture (routine x 2)     Status: None   Collection Time: 05/09/19  1:10 PM   Specimen: BLOOD LEFT HAND  Result Value Ref Range Status   Specimen Description BLOOD LEFT HAND  Final   Special Requests   Final     BOTTLES DRAWN AEROBIC AND ANAEROBIC Blood Culture adequate volume   Culture   Final    NO GROWTH 5 DAYS Performed at [facility], [address], [place], [place] [address]    Report Status 05/14/2019  FINAL  Final  SARS CORONAVIRUS 2 (TAT 6-24 HRS) Nasopharyngeal Nasopharyngeal Swab     Status: None   Collection Time: 05/09/19  2:28 PM   Specimen: Nasopharyngeal Swab  Result Value Ref Range Status   SARS Coronavirus 2 NEGATIVE NEGATIVE Final    Comment: (NOTE) SARS-CoV-2 target nucleic acids are NOT DETECTED. The SARS-CoV-2 RNA is generally detectable in upper and lower respiratory specimens during the acute phase of infection. Negative results do not preclude SARS-CoV-2 infection, do not rule out co-infections with other pathogens, and should not be used as the sole basis for treatment or other patient management decisions. Negative results must be combined with clinical observations, patient history, and epidemiological information. The expected result is Negative. Fact Sheet for Patients: SugarRoll.be Fact Sheet for Healthcare Providers: https://www.woods-mathews.com/ This test is not yet approved or cleared by the [place] FDA and  has been authorized for detection and/or diagnosis of SARS-CoV-2 by FDA under an Emergency Use Authorization (EUA). This EUA will remain  in effect (meaning this test can be used) for the duration of the COVID-19 declaration under Section 56 4(b)(1) of the Act, 21 U.S.C. section 360bbb-3(b)(1), unless the authorization is terminated or revoked sooner. Performed at [facility], [place] [address] [place], [place] [address]   MRSA PCR Screening     Status: None   Collection Time: 05/09/19  8:47 PM   Specimen: Nasal Mucosa; Nasopharyngeal  Result Value Ref Range Status   MRSA by PCR NEGATIVE NEGATIVE Final    Comment:        The GeneXpert MRSA Assay (FDA approved for NASAL specimens only), is one  component of a comprehensive MRSA colonization surveillance program. It is not intended to diagnose MRSA infection nor to guide or monitor treatment for MRSA infections. Performed at [facility], [address] [place], [place] [address]    Radiology Reports Ct Abdomen Pelvis Wo Contrast  Result Date: 05/09/2019 CLINICAL DATA:  Abdominal distension. Possible volvulus. EXAM: CT ABDOMEN AND PELVIS WITHOUT CONTRAST TECHNIQUE: Multidetector CT imaging of the abdomen and pelvis was performed following the standard protocol without IV contrast. COMPARISON:  11/03/2016 FINDINGS: Lower chest: Motion artifact through the lung bases with consolidative opacities in the lower lobes and to a lesser extent posterior lingula and right middle lobe. No pleural effusion. Coronary atherosclerosis. Hepatobiliary: 2 cm cyst inferiorly in the right hepatic lobe, mildly enlarged from 2018. Contracted gallbladder. No biliary dilatation. Pancreas: Unremarkable. Spleen: Unremarkable. Adrenals/Urinary Tract: Unremarkable adrenal glands. Unchanged small calcifications in both renal hila which may represent vascular calcifications or non-obstructing calculi. No hydroureteronephrosis. Unremarkable bladder. Stomach/Bowel: The stomach is collapsed. There is no small bowel dilatation. There is a moderately large amount of stool in the distal sigmoid colon and rectum. The more proximal sigmoid colon is redundant and moderately distended by gas. An ordinary amount of stool is present in the more proximal colon, which is nondilated. Left-sided colonic diverticulosis is noted without evidence of diverticulitis. The appendix is unremarkable. Vascular/Lymphatic: Aortic atherosclerosis without aneurysm. No enlarged lymph nodes. Reproductive: Unremarkable prostate. Other: No ascites or pneumoperitoneum. Fat containing paraumbilical hernia. Musculoskeletal: No acute osseous abnormality or suspicious osseous lesion. Mild lumbar levoscoliosis.  Advanced lumbar spondylosis with interbody and facet ankylosis at L4-5. IMPRESSION: 1. Moderately large amount of stool in the distal sigmoid colon and rectum. Moderate gaseous distension of the more proximal sigmoid colon without evidence of volvulus or bowel obstruction. 2. Bibasilar lung consolidation concerning for pneumonia. 3. Aortic Atherosclerosis (ICD10-I70.0). Electronically Signed  By: Logan Bores M.D.   On: 05/09/2019 15:45   Dg Chest Port 1 View  Result Date: 05/17/2019 CLINICAL DATA:  Respiratory distress, intubated EXAM: PORTABLE CHEST 1 VIEW COMPARISON:  Radiograph 05/16/2019 FINDINGS: Endotracheal tube remains in the mid trachea, 3.5 cm from the carina. Gradient density is obscuring both lung bases likely reflecting pleural fluid. Some persistent airspace opacities noted throughout both lungs as well. No pneumothorax. Cardiomediastinal contours are similar to prior including a calcified, tortuous aorta. No acute osseous or soft tissue abnormality. Degenerative changes are present in the imaged spine and shoulders. IMPRESSION: 1. Endotracheal tube remains in the mid trachea, 3.5 cm from the carina. 2. Bilateral pleural effusions with associated airspace opacities similar to comparison. 3. No pneumothorax or pneumomediastinum. Electronically Signed   By: Lovena Le M.D.   On: 05/17/2019 05:14   Dg Chest Port 1 View  Result Date: 05/16/2019 CLINICAL DATA:  Status post intubation. EXAM: PORTABLE CHEST 1 VIEW COMPARISON:  Single-view of the chest 05/15/2019. FINDINGS: New endotracheal tube is in place with the tip in good position at the level of the clavicular heads. Right worse than left airspace disease has progressed since yesterday's examination. Heart size is upper normal. There are likely small bilateral pleural effusions. No pneumothorax. IMPRESSION: ETT in good position. Worsened bilateral airspace disease small pleural effusions. Electronically Signed   By: Inge Rise M.D.    On: 05/16/2019 11:57   Dg Chest Port 1 View  Result Date: 05/15/2019 CLINICAL DATA:  Dyspnea and sepsis EXAM: PORTABLE CHEST 1 VIEW COMPARISON:  Two days ago FINDINGS: Cardiomegaly and aortic tortuosity. Low volume chest with hazy density at the left more than right base. No pneumothorax or air bronchogram. Artifact from hardware. IMPRESSION: Low volume chest with hazy opacity at the bases correlating with atelectasis on most recent abdominal CT. Cardiomegaly. Electronically Signed   By: Monte Fantasia M.D.   On: 05/15/2019 07:28   Dg Chest Port 1 View  Result Date: 05/13/2019 CLINICAL DATA:  Sepsis. EXAM: PORTABLE CHEST 1 VIEW COMPARISON:  05/09/2019 FINDINGS: 0528 hours. Lordotic position. Low lung volumes. The cardio pericardial silhouette is enlarged. There is pulmonary vascular congestion without overt pulmonary edema. Bibasilar collapse/consolidation again noted, left greater than right. IMPRESSION: Cardiomegaly with low lung volumes and bibasilar collapse/consolidation, left greater than right. Electronically Signed   By: Misty Stanley M.D.   On: 05/13/2019 07:13   Dg Chest Port 1 View  Result Date: 05/09/2019 CLINICAL DATA:  Shortness of breath.  Hypoxia. EXAM: PORTABLE CHEST 1 VIEW COMPARISON:  January 16, 2019. FINDINGS: Stable cardiomediastinal silhouette. No pneumothorax is noted. Hypoinflation of the lungs is noted. Mild bibasilar atelectasis or infiltrates are noted. Bony thorax is unremarkable. IMPRESSION: Mild bibasilar subsegmental atelectasis or infiltrates are noted. Electronically Signed   By: Marijo Conception M.D.   On: 05/09/2019 13:26    CBC Recent Labs  Lab 05/11/19 0408 05/13/19 0520 05/15/19 0549 05/16/19 0423  WBC 11.1* 8.6 8.2 8.0  HGB 15.0 14.2 12.9* 12.5*  HCT 52.3* 48.2 42.2 41.0  PLT 210 166 165 177  MCV 107.2* 104.6* 100.2* 99.5  MCH 30.7 30.8 30.6 30.3  MCHC 28.7* 29.5* 30.6 30.5  RDW 14.3 14.0 13.3 13.2  LYMPHSABS  --   --  1.7  --   MONOABS  --   --   0.5  --   EOSABS  --   --  0.2  --   BASOSABS  --   --  0.0  --  Chemistries  Recent Labs  Lab 05/12/19 1549 05/13/19 0520 05/14/19 0428 05/15/19 0549 05/16/19 0423 05/16/19 1033 05/16/19 1707 05/17/19 0451  NA 155* 152* 147* 143 145  --   --   --   K 3.8 3.5 3.1* 3.5 3.2*  --   --   --   CL 122* 119* 113* 110 110  --   --   --   CO2 _0 --   --   --   GLUCOSE 132* 205* 187* 178* 155*  --   --   --   BUN 28* 25* _1 --   --   --   CREATININE 1.37* 1.35* 1.37* 1.37* 1.42*  --   --   --   CALCIUM 7.7* 7.7* 7.6* 7.5* 7.6*  --   --   --   MG  --   --   --   --   --  1.9 2.0 2.0   ------------------------------------------------------------------------------------------------------------------ Recent Labs    05/16/19 0423 05/17/19 0451  TRIG 98 125    Lab Results  Component Value Date   HGBA1C 11.3 (H) 05/09/2019   ------------------------------------------------------------------------------------------------------------------ No results for input(s): TSH, T4TOTAL, T3FREE, THYROIDAB in the last 72 hours.  Invalid input(s): FREET3 ------------------------------------------------------------------------------------------------------------------ No results for input(s): VITAMINB12, FOLATE, FERRITIN, TIBC, IRON, RETICCTPCT in the last 72 hours.  Coagulation profile Recent Labs  Lab 05/14/19 1531  INR 1.4*    No results for input(s): DDIMER in the last 72 hours.  Cardiac Enzymes No results for input(s): CKMB, TROPONINI, MYOGLOBIN in the last 168 hours.  Invalid input(s): CK   ------------------------------------------------------------------------------------------------------------------    Component Value Date/Time   BNP 18.0 02/21/2017 0730    Kwana Ringel M.D on 05/17/2019 at 1:34 PM  Go to [email or web address] - for contact info  [facility] - Office  [phone]

## 2019-05-18 LAB — BLOOD GAS, ARTERIAL
Acid-Base Excess: 9.2 mmol/L — ABNORMAL HIGH (ref 0.0–2.0)
Acid-Base Excess: 9 mmol/L — ABNORMAL HIGH (ref 0.0–2.0)
Bicarbonate: 32.3 mmol/L — ABNORMAL HIGH (ref 20.0–28.0)
Bicarbonate: 31.1 mmol/L — ABNORMAL HIGH (ref 20.0–28.0)
Drawn by: 22223
FIO2: 50
FIO2: 40
MECHVT: 540 mL
O2 Saturation: 94.9 %
O2 Saturation: 92 %
PEEP: 5 cmH2O
Patient temperature: 37
Patient temperature: 36.8
RATE: 18 resp/min
pCO2 arterial: 47 mmHg (ref 32.0–48.0)
pCO2 arterial: 54.1 mmHg — ABNORMAL HIGH (ref 32.0–48.0)
pH, Arterial: 7.465 — ABNORMAL HIGH (ref 7.350–7.450)
pH, Arterial: 7.413 (ref 7.350–7.450)
pO2, Arterial: 71.2 mmHg — ABNORMAL LOW (ref 83.0–108.0)
pO2, Arterial: 64.4 mmHg — ABNORMAL LOW (ref 83.0–108.0)

## 2019-05-18 LAB — GLUCOSE, CAPILLARY
Glucose-Capillary: 210 mg/dL — ABNORMAL HIGH (ref 70–99)
Glucose-Capillary: 211 mg/dL — ABNORMAL HIGH (ref 70–99)
Glucose-Capillary: 211 mg/dL — ABNORMAL HIGH (ref 70–99)
Glucose-Capillary: 214 mg/dL — ABNORMAL HIGH (ref 70–99)
Glucose-Capillary: 226 mg/dL — ABNORMAL HIGH (ref 70–99)
Glucose-Capillary: 251 mg/dL — ABNORMAL HIGH (ref 70–99)

## 2019-05-18 LAB — RENAL FUNCTION PANEL
Albumin: 2.3 g/dL — ABNORMAL LOW (ref 3.5–5.0)
Anion gap: 9 (ref 5–15)
BUN: 30 mg/dL — ABNORMAL HIGH (ref 8–23)
CO2: 30 mmol/L (ref 22–32)
Calcium: 7.9 mg/dL — ABNORMAL LOW (ref 8.9–10.3)
Chloride: 100 mmol/L (ref 98–111)
Creatinine, Ser: 1.29 mg/dL — ABNORMAL HIGH (ref 0.61–1.24)
GFR calc Af Amer: >60 mL/min (ref >60)
GFR calc non Af Amer: 55 mL/min — ABNORMAL LOW (ref >60)
Glucose, Bld: 267 mg/dL — ABNORMAL HIGH (ref 70–99)
Phosphorus: 2.2 mg/dL — ABNORMAL LOW (ref 2.5–4.6)
Potassium: 4.1 mmol/L (ref 3.5–5.1)
Sodium: 139 mmol/L (ref 135–145)

## 2019-05-18 LAB — CBC
HCT: 43.2 % (ref 39.0–52.0)
Hemoglobin: 13.3 g/dL (ref 13.0–17.0)
MCH: 30.5 pg (ref 26.0–34.0)
MCHC: 30.8 g/dL (ref 30.0–36.0)
MCV: 99.1 fL (ref 80.0–100.0)
Platelets: 227 10*3/uL (ref 150–400)
RBC: 4.36 MIL/uL (ref 4.22–5.81)
RDW: 13.8 % (ref 11.5–15.5)
WBC: 7.8 10*3/uL (ref 4.0–10.5)
nRBC: 0 % (ref 0.0–0.2)

## 2019-05-18 LAB — BASIC METABOLIC PANEL
Anion gap: 8 (ref 5–15)
BUN: 28 mg/dL — ABNORMAL HIGH (ref 8–23)
CO2: 29 mmol/L (ref 22–32)
Calcium: 7.7 mg/dL — ABNORMAL LOW (ref 8.9–10.3)
Chloride: 102 mmol/L (ref 98–111)
Creatinine, Ser: 1.21 mg/dL (ref 0.61–1.24)
GFR calc Af Amer: >60 mL/min (ref >60)
GFR calc non Af Amer: 59 mL/min — ABNORMAL LOW (ref >60)
Glucose, Bld: 246 mg/dL — ABNORMAL HIGH (ref 70–99)
Potassium: 3.2 mmol/L — ABNORMAL LOW (ref 3.5–5.1)
Sodium: 139 mmol/L (ref 135–145)

## 2019-05-18 LAB — TRIGLYCERIDES: Triglycerides: 164 mg/dL — ABNORMAL HIGH (ref <150)

## 2019-05-18 MED ORDER — APIXABAN 5 MG PO TABS
5.0000 mg | ORAL_TABLET | Freq: Two times a day (BID) | ORAL | Status: DC
  Administered 2019-05-18 – 2019-05-23 (×11): 5 mg
  Filled 2019-05-18 (×11): qty 1

## 2019-05-18 MED ORDER — PANTOPRAZOLE SODIUM 40 MG PO PACK
20.0000 mg | PACK | Freq: Two times a day (BID) | ORAL | Status: DC
  Administered 2019-05-18 – 2019-05-22 (×10): 20 mg
  Filled 2019-05-18 (×3): qty 20

## 2019-05-18 MED ORDER — POLYETHYLENE GLYCOL 3350 17 G PO PACK
17.0000 g | PACK | Freq: Two times a day (BID) | ORAL | Status: DC
  Administered 2019-05-18: 17 g
  Filled 2019-05-18: qty 1

## 2019-05-18 MED ORDER — ATORVASTATIN CALCIUM 10 MG PO TABS
10.0000 mg | ORAL_TABLET | Freq: Every day | ORAL | Status: DC
  Administered 2019-05-18 – 2019-05-23 (×6): 10 mg
  Filled 2019-05-18 (×6): qty 1

## 2019-05-18 MED ORDER — POLYETHYLENE GLYCOL 3350 17 G PO PACK
17.0000 g | PACK | Freq: Every day | ORAL | Status: DC
  Administered 2019-05-19 – 2019-05-22 (×4): 17 g
  Filled 2019-05-18 (×4): qty 1

## 2019-05-18 MED ORDER — METHIMAZOLE 5 MG PO TABS
5.0000 mg | ORAL_TABLET | Freq: Every day | ORAL | Status: DC
  Administered 2019-05-18 – 2019-05-23 (×6): 5 mg
  Filled 2019-05-18 (×9): qty 1

## 2019-05-18 MED ORDER — INSULIN GLARGINE 100 UNIT/ML ~~LOC~~ SOLN
10.0000 [IU] | Freq: Every day | SUBCUTANEOUS | Status: DC
  Administered 2019-05-18 – 2019-05-20 (×3): 10 [IU] via SUBCUTANEOUS
  Filled 2019-05-18 (×5): qty 0.1

## 2019-05-18 MED ORDER — POTASSIUM CHLORIDE CRYS ER 20 MEQ PO TBCR: 40.0000 meq | EXTENDED_RELEASE_TABLET | ORAL | Status: DC

## 2019-05-18 MED ORDER — LABETALOL HCL 200 MG PO TABS
100.0000 mg | ORAL_TABLET | Freq: Two times a day (BID) | ORAL | Status: DC
  Administered 2019-05-18 – 2019-05-23 (×11): 100 mg
  Filled 2019-05-18 (×11): qty 1

## 2019-05-18 MED ORDER — SENNOSIDES-DOCUSATE SODIUM 8.6-50 MG PO TABS
2.0000 | ORAL_TABLET | Freq: Every day | ORAL | Status: DC
  Administered 2019-05-19 – 2019-05-22 (×4): 2
  Filled 2019-05-18 (×4): qty 2

## 2019-05-18 MED ORDER — ACETAMINOPHEN 650 MG RE SUPP: 650.0000 mg | Freq: Four times a day (QID) | RECTAL | Status: DC | PRN

## 2019-05-18 MED ORDER — ONDANSETRON HCL 4 MG/2ML IJ SOLN: 4.0000 mg | Freq: Four times a day (QID) | INTRAMUSCULAR | Status: DC | PRN

## 2019-05-18 MED ORDER — ONDANSETRON HCL 4 MG PO TABS: 4.0000 mg | ORAL_TABLET | Freq: Four times a day (QID) | ORAL | Status: DC | PRN

## 2019-05-18 MED ORDER — POTASSIUM CHLORIDE 20 MEQ/15ML (10%) PO SOLN
40.0000 meq | ORAL | Status: AC
  Administered 2019-05-18 (×2): 40 meq
  Filled 2019-05-18 (×2): qty 30

## 2019-05-18 MED ORDER — ACETAMINOPHEN 325 MG PO TABS: 650.0000 mg | ORAL_TABLET | Freq: Four times a day (QID) | ORAL | Status: DC | PRN

## 2019-05-18 MED ORDER — SENNOSIDES-DOCUSATE SODIUM 8.6-50 MG PO TABS
2.0000 | ORAL_TABLET | Freq: Two times a day (BID) | ORAL | Status: DC
  Administered 2019-05-18: 2
  Filled 2019-05-18: qty 2

## 2019-05-18 MED ORDER — TRAZODONE HCL 50 MG PO TABS: 50.0000 mg | ORAL_TABLET | Freq: Every evening | ORAL | Status: DC | PRN

## 2019-05-18 NOTE — Progress Notes (Addendum)
Nutrition Follow-up  DOCUMENTATION CODES:   Obesity unspecified  INTERVENTION:  -Increase Vital High Protein-@ 70 ml/hr via PEG (1680 ml daily).   -d/c Prostat via tube BID.   Tube feeding regimen-1680 kcal, 141 gr protein and 1349 ml water.   Free water per MD- 200 ml q 4 hr.-adds 1200 ml free water daily.  NUTRITION DIAGNOSIS:   Inadequate oral intake related to acute illness, dysphagia(sepsis secondary to pneumonia; h/o stroke with residual right-sided hemiparesis;dysphagia) as evidenced by NPO status(s/p PEG placement).   GOAL:  Provide needs based on ASPEN/SCCM guidelines  MONITOR:   Vent status, Labs, Weight trends, I & O's, Skin, TF tolerance  REASON FOR ASSESSMENT:   Consult, Ventilator Enteral/tube feeding initiation and management  ASSESSMENT:   73 year old male with past medical history significant for prior stroke with residual right-sided hemiparesis and dysphagia, recurrent DVT/PE, hypothyroidism, HTN, recurrent episodes of sigmoid volvulus who presented from [facility] with hypoxia requiring nonrebreather bag and reported fevers. CXR with possible pneumonia; CT abdomen and pelvis with bilateral pneumonia and constipation with gaseous distention of proximal sigmoid.  11/27- Patient remains intubated/ sedated-propofol at 13.18 ml/hr (348 kcal/daily). Free water 200 ml q 4 hrs Intubated on 11/24 @ 0817. Last BM-4 days ago. Hx of problems with constipation.  MV: 8.1 L/min Temp (24hrs), Avg:98.4 F (36.9 C), Min:98.1 F (36.7 C), Max:99 F (37.2 C)  11/30-Patient extubated and nutrition needs re-calculated. High risk for re-intubation per MD having problem with significant secretions. Tube feeding has been tolerated well. Advance rate today. Weight and output reviewed. Discussed with nursing.    Intake/Output Summary (Last 24 hours) at 05/21/2019 1156 Last data filed at 05/21/2019 1049 Gross per 24 hour  Intake 388 ml  Output 1500 ml  Net -1112 ml     Medications reviewed and include: Lipitor, Dulcolax  Labs: BMP Latest Ref Rng & Units 05/21/2019 05/20/2019 05/18/2019  Glucose 70 - 99 mg/dL 199(H) 190(H) 267(H)  BUN 8 - 23 mg/dL 29(H) 25(H) 30(H)  Creatinine 0.61 - 1.24 mg/dL 1.10 1.10 1.29(H)  Sodium 135 - 145 mmol/L 135 138 139  Potassium 3.5 - 5.1 mmol/L 3.8 3.7 4.1  Chloride 98 - 111 mmol/L 95(L) 97(L) 100  CO2 22 - 32 mmol/L 31 29 30   Calcium 8.9 - 10.3 mg/dL 8.2(L) 8.3(L) 7.9(L)     Diet Order:   Diet Order            Diet NPO time specified  Diet effective now             EDUCATION NEEDS:   No education needs have been identified at this time  Skin:  Skin Assessment: Reviewed RN Assessment(MASD; groin; incision;closed;abdomen)  Last BM:  11/28  Height:   Ht Readings from Last 1 Encounters:  05/15/19 5\' 8"  (1.727 m)    Weight:   Wt Readings from Last 1 Encounters:  05/21/19 105.3 kg    Ideal Body Weight:  70 kg  BMI:  Body mass index is 35.3 kg/m.  Estimated Nutritional Needs:   Kcal: 1540-1680   Protein:  126-140  Fluid:  per MD   Colman Cater MS,RD,CSG,LDN Office: [phone] Pager: [phone]

## 2019-05-18 NOTE — Progress Notes (Signed)
Patient Demographics:    [name] [name], is a 73 y.o. male, DOB - 1945/09/12, [name] [name]  Admit date - 05/09/2019   Admitting Physician  [name] Denton Brick, MD  Outpatient Primary MD for the patient is [name] Duos, MD  LOS - 9  Chief Complaint  Patient presents with   Respiratory Distress      Subjective:    [name] [name] today did well with sedation vacation -Also did well with CPAP trial -Subsequently extubated -O2 sats 93% on 6 L of high flow oxygen -Tolerating PEG tube feeds well  Assessment  & Plan :    Principal Problem:   Sepsis secondary to pneumonia Active Problems:   Long term current use of anticoagulant therapy-on Eliquis due to history of DVT/PE and strokes   Bil PNA (pneumonia)--suspect aspiration related   Hypernatremia   Acute hypoxic respiratory failure due to bilateral pneumonia   Endotracheally intubated--- on ventilator   Dysphagia 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, as well as history of hyperthyroidism, HTN and recurrent episodes of sigmoid volvulus admitted on 05/09/2019 from [facility] with hypoxia requiring nonrebreather bag, secondary to bilateral pneumonia probably aspiration related,  also found with severe hyperglycemia and severe hypernatremia without frank DKA -Had PEG on 05/15/2019,  Intubated 05/15/2019 Extubated 05/18/2019   A/p 1)-Sepsis secondary to Bilateral Pneumonia--suspect aspiration related in  the patient with dysphagia--- has penicillin allergy -Pharmacy consult appreciated -Completed cefepime and Flagyl  -Patient met sepsis criteria on admission with transient hypotension requiring aggressive IV fluid boluses, tachypnea, hypoxia, leukocytosis and, reports of fever -COVID-19 test Neg -Sepsis pathophysiology has resolved  2)Bilateral Pneumonia--- suspect recurrent aspiration related,  completed antibiotics as above #1 -c/n bronchodilators and mucolytics --Had PEG on 05/15/2019,  -Continue supplemental oxygen and bronchodilators  3)Acute Hypoxic Respiratory Failure--- secondary to #2 above, treat as above #1 and #2, -Chest x-ray from 05/17/2019 noted with bilateral pleural effusions, received V Lasix,  -Intubated 05/15/2019 -Extubated 05/18/2019 -Check repeat chest x-ray on 05/19/2019 -Respiratory status continues to improve  4)New onset DM2 severe hyperglycemia without frank DKA--  --A1c 11.3 -Glycemic control continues to improve, -Restart Lantus at 10 units daily Use Novolog/Humalog Sliding scale insulin with Accu-Cheks/Fingersticks as ordered   5)HyperNatremia--sodium is down to 139 from 161,  -it was due to dehydration and free water deficit,  - suspect significant free water deficit PTA in the patient with dysphagia who is on honey thickened liquids and now with severe hyperglycemia and sepsis--  had PEG tube placement on 05/15/2019 to allow for free water and medication administration -c/n  free water via PEG tube  6)AKI----acute kidney injury due to severe dehydration in the setting of sepsis with transient  hypotension as well as significant dehydration and volume depletion in a patient who is on honey thickened liquids-     creatinine  on admission=2.85  ,   baseline creatinine = 1.0 ( 6 months PTA ), creatinine is down to 1.21 with hydration,  renally adjust medications, avoid nephrotoxic agents / dehydration /hypotension -Continue to Hold Diovan and HCTZ  7)History of prior stroke/DVT/PE---  restarted Eliquis   8)Dysphagia--- patient with prior stroke with right hemiparesis and Dysphagia, PTA was on honey thickened liquids.   -Patient and his POA/brother [name] [name] have  decided to pursue  PEG/G-tube--so he can have free water administered and reduce risk for aspiration as well as reduce risk of AKI/dehydration/hypernatremia -Continue vital HP PEG tube feeding currently at 35 mL an hour via PEG tube, continue water flushes  9)Social/Ethics- Patient is a poor historian due to dysarthria from prior stroke  -Discussed with patient's brother/POA -Mr [name] [name] at [phone]  -Plan of care and advanced directive discussed, patient is a full code  10)H/o Prior CVA with residual right sided hemiparesis and Dysphagia--- continue Eliquis and Lipitor  11)Hyperthyroidism: -Continue methimazole, okay to restart labetalol  12)History of recurrent sigmoid volvulus and chronic constipation--CT abdomen and pelvis without evidence of volvulus or SBO at this time, laxatives as ordered  13) Enterococcus faecalis UTI--resistant to quinolones, patient is allergy to penicillins, okay to complete Macrobid, watch renal function closely  CRITICAL CARE Performed by: Roxan Hockey   Total critical care time: 43 minutes  Critical care time was exclusive of separately billable procedures and treating other patients.  Critical care was necessary to treat or prevent imminent or life-threatening deterioration.  Ventilator Settings-  Fio2- 40%/PEEP 5/RR 18/TV 540  -ABG reviewed  did well with sedation vacation -Also did well with CPAP trial -Subsequently extubated -O2 sats 93% on 6 L of high flow oxygen   Critical care was time  spent personally by me on the following activities: development of treatment plan with patient and/or surrogate as well as nursing, discussions with consultants, evaluation of patient's response to treatment, examination of patient, obtaining history from patient or surrogate, ordering and performing treatments and interventions, ordering and review of laboratory studies, ordering and review of radiographic studies, pulse oximetry and re-evaluation of patient's condition.   Disposition/Need for in-Hospital Stay- patient unable to be discharged at this time due to  - Extubated 05/18/2019, possible discharge back to SNF rehab if remains stable from a pulmonary standpoint postextubation Code Status : Full Code  Family Communication:  Discussed with patient's brother/POA -Mr Harding Thomure at [phone]--QASUORVI times during this hospitalization (last update 05/18/2019)  Procedures-  -Had PEG on 05/15/2019,  Intubated 05/15/2019 Extubated 05/18/2019  Disposition Plan  : TBD  Antibiotic therapy Vancomycin 11/18>>11/18 Cefepime 11/18 >> 11/25 Flagyl 11/18>>11/25 Macrobid 11/20 >> 11/27  Consults  : Pulmonologist Dr. Luan Pulling  DVT Prophylaxis  : Eliquis- SCDs /   Lab Results  Component Value Date   PLT 227 05/18/2019    Inpatient Medications  Scheduled Meds:  apixaban  5 mg Per Tube BID   atorvastatin  10 mg Per Tube Daily   bisacodyl  10 mg Rectal QHS   chlorhexidine gluconate (MEDLINE KIT)  15 mL Mouth Rinse BID   Chlorhexidine Gluconate Cloth  6 each Topical Daily   feeding supplement (PRO-STAT SUGAR FREE 64)  60 mL Per Tube BID   feeding supplement (VITAL HIGH PROTEIN)  1,000 mL Per Tube Q24H   free water  200 mL Per Tube  Q4H   insulin aspart  0-6 Units Subcutaneous Q4H   insulin glargine  10 Units Subcutaneous Daily   ipratropium-albuterol  3 mL Nebulization TID   labetalol  100 mg Per Tube BID   mouth rinse  15 mL Mouth Rinse 10 times per day   methimazole  5  mg Per Tube Daily   multivitamin with minerals  1 tablet Per Tube Daily   pantoprazole sodium  20 mg Per Tube BID   polyethylene glycol  17 g Per Tube BID   polyvinyl alcohol  1 drop Right Eye TID   senna-docusate  2 tablet Per Tube BID   sodium chloride flush  3 mL Intravenous Q12H   Continuous Infusions:  sodium chloride     propofol (DIPRIVAN) infusion Stopped (05/18/19 1233)   PRN Meds:.sodium chloride, acetaminophen **OR** acetaminophen, albuterol, dextrose, fentaNYL (SUBLIMAZE) injection, fentaNYL (SUBLIMAZE) injection, food thickener, ondansetron **OR** ondansetron (ZOFRAN) IV, sennosides, sodium chloride flush, traZODone   Anti-infectives (From admission, onward)   Start     Dose/Rate Route Frequency Ordered Stop   05/15/19 0600  ciprofloxacin (CIPRO) IVPB 400 mg     400 mg 200 mL/hr over 60 Minutes Intravenous On call to O.R. 05/14/19 1951 05/15/19 0804   05/11/19 1600  vancomycin (VANCOCIN) 1,500 mg in sodium chloride 0.9 % 500 mL IVPB  Status:  Discontinued     1,500 mg 250 mL/hr over 120 Minutes Intravenous Every 48 hours 05/09/19 1704 05/10/19 0809   05/11/19 1130  nitrofurantoin (macrocrystal-monohydrate) (MACROBID) capsule 100 mg     100 mg Oral Every 12 hours 05/11/19 1125 05/18/19 0959   05/10/19 1400  ceFEPIme (MAXIPIME) 2 g in sodium chloride 0.9 % 100 mL IVPB  Status:  Discontinued     2 g 200 mL/hr over 30 Minutes Intravenous Every 24 hours 05/09/19 1704 05/10/19 0813   05/10/19 1000  ceFEPIme (MAXIPIME) 2 g in sodium chloride 0.9 % 100 mL IVPB  Status:  Discontinued     2 g 200 mL/hr over 30 Minutes Intravenous Every 12 hours 05/10/19 0813 05/16/19 0813   05/09/19 1900  metroNIDAZOLE (FLAGYL) IVPB 500 mg  Status:  Discontinued     500 mg 100 mL/hr over 60 Minutes Intravenous Every 8 hours 05/09/19 1842 05/16/19 0813   05/09/19 1400  vancomycin (VANCOCIN) IVPB 1000 mg/200 mL premix     1,000 mg 200 mL/hr over 60 Minutes Intravenous Every 1 hr x 2  05/09/19 1302 05/09/19 1643   05/09/19 1300  vancomycin (VANCOCIN) IVPB 1000 mg/200 mL premix  Status:  Discontinued     1,000 mg 200 mL/hr over 60 Minutes Intravenous  Once 05/09/19 1256 05/09/19 1302   05/09/19 1300  ceFEPIme (MAXIPIME) 2 g in sodium chloride 0.9 % 100 mL IVPB     2 g 200 mL/hr over 30 Minutes Intravenous  Once 05/09/19 1256 05/09/19 1349        Objective:   Vitals:   05/18/19 0945 05/18/19 1000 05/18/19 1209 05/18/19 1230  BP: (!) 159/94 (!) 156/105  (!) 174/106  Pulse: 88 89  94  Resp: (!) 22 (!) 26  (!) 23  Temp:   99 F (37.2 C)   TempSrc:   Axillary   SpO2: 94% 92%  93%  Weight:      Height:        Wt Readings from Last 3 Encounters:  05/18/19 104.4 kg  05/13/19 106.1 kg  05/08/19 106.4 kg     Intake/Output Summary (Last 24  hours) at 05/18/2019 1309 Last data filed at 05/18/2019 1252 Gross per 24 hour  Intake 576.7 ml  Output 2700 ml  Net -2123.3 ml   Physical Exam  Gen:-awake , talking post extubation HEENT:- [place].AT, No sclera icterus  Mouth-ET tube (removed 05/18/2019) Neck-Supple Neck,No JVD,.  Lungs-improving air movement bilaterally, no wheezing CV- S1, S2 normal, regular  Abd-  +ve B.Sounds, Abd Soft, soft umbilical hernia, PEG tube in situ Extremity/Skin:- warm  , pedal pulses present  Psych-appropriate affect, oriented post extubation Neuro --residual right-sided hemiparesis, residual facial droop with residual dysarthria from prior stroke, no new focal deficits,     Data Review:   Micro Results Recent Results (from the past 240 hour(s))  Urine culture     Status: Abnormal   Collection Time: 05/09/19 12:55 PM   Specimen: In/Out Cath Urine  Result Value Ref Range Status   Specimen Description   Final    IN/OUT CATH URINE Performed at [facility], [address], [place], [place] [address]    Special Requests   Final    NONE Performed at [facility], [address] [place], [place] [address]    Culture >=100,000  COLONIES/mL ENTEROCOCCUS FAECALIS (A)  Final   Report Status 05/11/2019 FINAL  Final   Organism ID, Bacteria ENTEROCOCCUS FAECALIS (A)  Final      Susceptibility   Enterococcus faecalis - MIC*    AMPICILLIN <=2 SENSITIVE Sensitive     LEVOFLOXACIN >=8 RESISTANT Resistant     NITROFURANTOIN <=16 SENSITIVE Sensitive     VANCOMYCIN 1 SENSITIVE Sensitive     * >=100,000 COLONIES/mL ENTEROCOCCUS FAECALIS  Blood Culture (routine x 2)     Status: None   Collection Time: 05/09/19  1:10 PM   Specimen: BLOOD RIGHT HAND  Result Value Ref Range Status   Specimen Description BLOOD RIGHT HAND  Final   Special Requests   Final    BOTTLES DRAWN AEROBIC AND ANAEROBIC Blood Culture adequate volume   Culture   Final    NO GROWTH 5 DAYS Performed at [facility], [address], [place], [place] [address]    Report Status 05/14/2019 FINAL  Final  Blood Culture (routine x 2)     Status: None   Collection Time: 05/09/19  1:10 PM   Specimen: BLOOD LEFT HAND  Result Value Ref Range Status   Specimen Description BLOOD LEFT HAND  Final   Special Requests   Final    BOTTLES DRAWN AEROBIC AND ANAEROBIC Blood Culture adequate volume   Culture   Final    NO GROWTH 5 DAYS Performed at [facility], [address], [place], [place] [address]    Report Status 05/14/2019 FINAL  Final  SARS CORONAVIRUS 2 (TAT 6-24 HRS) Nasopharyngeal Nasopharyngeal Swab     Status: None   Collection Time: 05/09/19  2:28 PM   Specimen: Nasopharyngeal Swab  Result Value Ref Range Status   SARS Coronavirus 2 NEGATIVE NEGATIVE Final    Comment: (NOTE) SARS-CoV-2 target nucleic acids are NOT DETECTED. The SARS-CoV-2 RNA is generally detectable in upper and lower respiratory specimens during the acute phase of infection. Negative results do not preclude SARS-CoV-2 infection, do not rule out co-infections with other pathogens, and should not be used as the sole basis for treatment or other patient management decisions. Negative  results must be combined with clinical observations, patient history, and epidemiological information. The expected result is Negative. Fact Sheet for Patients: SugarRoll.be Fact Sheet for Healthcare Providers: https://www.woods-mathews.com/ This  test is not yet approved or cleared by the [place] and  has been authorized for detection and/or diagnosis of SARS-CoV-2 by FDA under an Emergency Use Authorization (EUA). This EUA will remain  in effect (meaning this test can be used) for the duration of the COVID-19 declaration under Section 56 4(b)(1) of the Act, 21 U.S.C. section 360bbb-3(b)(1), unless the authorization is terminated or revoked sooner. Performed at [facility], [place] [address] [place], [place] [address]   MRSA PCR Screening     Status: None   Collection Time: 05/09/19  8:47 PM   Specimen: Nasal Mucosa; Nasopharyngeal  Result Value Ref Range Status   MRSA by PCR NEGATIVE NEGATIVE Final    Comment:        The GeneXpert MRSA Assay (FDA approved for NASAL specimens only), is one component of a comprehensive MRSA colonization surveillance program. It is not intended to diagnose MRSA infection nor to guide or monitor treatment for MRSA infections. Performed at [facility], [address] [place], [place] [address]    Radiology Reports Ct Abdomen Pelvis Wo Contrast  Result Date: 05/09/2019 CLINICAL DATA:  Abdominal distension. Possible volvulus. EXAM: CT ABDOMEN AND PELVIS WITHOUT CONTRAST TECHNIQUE: Multidetector CT imaging of the abdomen and pelvis was performed following the standard protocol without IV contrast. COMPARISON:  11/03/2016 FINDINGS: Lower chest: Motion artifact through the lung bases with consolidative opacities in the lower lobes and to a lesser extent posterior lingula and right middle lobe. No pleural effusion. Coronary atherosclerosis. Hepatobiliary: 2 cm cyst inferiorly in the right hepatic  lobe, mildly enlarged from 2018. Contracted gallbladder. No biliary dilatation. Pancreas: Unremarkable. Spleen: Unremarkable. Adrenals/Urinary Tract: Unremarkable adrenal glands. Unchanged small calcifications in both renal hila which may represent vascular calcifications or non-obstructing calculi. No hydroureteronephrosis. Unremarkable bladder. Stomach/Bowel: The stomach is collapsed. There is no small bowel dilatation. There is a moderately large amount of stool in the distal sigmoid colon and rectum. The more proximal sigmoid colon is redundant and moderately distended by gas. An ordinary amount of stool is present in the more proximal colon, which is nondilated. Left-sided colonic diverticulosis is noted without evidence of diverticulitis. The appendix is unremarkable. Vascular/Lymphatic: Aortic atherosclerosis without aneurysm. No enlarged lymph nodes. Reproductive: Unremarkable prostate. Other: No ascites or pneumoperitoneum. Fat containing paraumbilical hernia. Musculoskeletal: No acute osseous abnormality or suspicious osseous lesion. Mild lumbar levoscoliosis. Advanced lumbar spondylosis with interbody and facet ankylosis at L4-5. IMPRESSION: 1. Moderately large amount of stool in the distal sigmoid colon and rectum. Moderate gaseous distension of the more proximal sigmoid colon without evidence of volvulus or bowel obstruction. 2. Bibasilar lung consolidation concerning for pneumonia. 3. Aortic Atherosclerosis (ICD10-I70.0). Electronically Signed   By: Logan Bores M.D.   On: 05/09/2019 15:45   Dg Chest Port 1 View  Result Date: 05/17/2019 CLINICAL DATA:  Respiratory distress, intubated EXAM: PORTABLE CHEST 1 VIEW COMPARISON:  Radiograph 05/16/2019 FINDINGS: Endotracheal tube remains in the mid trachea, 3.5 cm from the carina. Gradient density is obscuring both lung bases likely reflecting pleural fluid. Some persistent airspace opacities noted throughout both lungs as well. No pneumothorax.  Cardiomediastinal contours are similar to prior including a calcified, tortuous aorta. No acute osseous or soft tissue abnormality. Degenerative changes are present in the imaged spine and shoulders. IMPRESSION: 1. Endotracheal tube remains in the mid trachea, 3.5 cm from the carina. 2. Bilateral pleural effusions with associated airspace opacities similar to comparison. 3. No pneumothorax or pneumomediastinum. Electronically Signed   By:  Lovena Le M.D.   On: 05/17/2019 05:14   Dg Chest Port 1 View  Result Date: 05/16/2019 CLINICAL DATA:  Status post intubation. EXAM: PORTABLE CHEST 1 VIEW COMPARISON:  Single-view of the chest 05/15/2019. FINDINGS: New endotracheal tube is in place with the tip in good position at the level of the clavicular heads. Right worse than left airspace disease has progressed since yesterday's examination. Heart size is upper normal. There are likely small bilateral pleural effusions. No pneumothorax. IMPRESSION: ETT in good position. Worsened bilateral airspace disease small pleural effusions. Electronically Signed   By: Inge Rise M.D.   On: 05/16/2019 11:57   Dg Chest Port 1 View  Result Date: 05/15/2019 CLINICAL DATA:  Dyspnea and sepsis EXAM: PORTABLE CHEST 1 VIEW COMPARISON:  Two days ago FINDINGS: Cardiomegaly and aortic tortuosity. Low volume chest with hazy density at the left more than right base. No pneumothorax or air bronchogram. Artifact from hardware. IMPRESSION: Low volume chest with hazy opacity at the bases correlating with atelectasis on most recent abdominal CT. Cardiomegaly. Electronically Signed   By: Monte Fantasia M.D.   On: 05/15/2019 07:28   Dg Chest Port 1 View  Result Date: 05/13/2019 CLINICAL DATA:  Sepsis. EXAM: PORTABLE CHEST 1 VIEW COMPARISON:  05/09/2019 FINDINGS: 0528 hours. Lordotic position. Low lung volumes. The cardio pericardial silhouette is enlarged. There is pulmonary vascular congestion without overt pulmonary edema.  Bibasilar collapse/consolidation again noted, left greater than right. IMPRESSION: Cardiomegaly with low lung volumes and bibasilar collapse/consolidation, left greater than right. Electronically Signed   By: Misty Stanley M.D.   On: 05/13/2019 07:13   Dg Chest Port 1 View  Result Date: 05/09/2019 CLINICAL DATA:  Shortness of breath.  Hypoxia. EXAM: PORTABLE CHEST 1 VIEW COMPARISON:  January 16, 2019. FINDINGS: Stable cardiomediastinal silhouette. No pneumothorax is noted. Hypoinflation of the lungs is noted. Mild bibasilar atelectasis or infiltrates are noted. Bony thorax is unremarkable. IMPRESSION: Mild bibasilar subsegmental atelectasis or infiltrates are noted. Electronically Signed   By: Marijo Conception M.D.   On: 05/09/2019 13:26    CBC Recent Labs  Lab 05/13/19 0520 05/15/19 0549 05/16/19 0423 05/18/19 0606  WBC 8.6 8.2 8.0 7.8  HGB 14.2 12.9* 12.5* 13.3  HCT 48.2 42.2 41.0 43.2  PLT 166 165 177 227  MCV 104.6* 100.2* 99.5 99.1  MCH 30.8 30.6 30.3 30.5  MCHC 29.5* 30.6 30.5 30.8  RDW 14.0 13.3 13.2 13.8  LYMPHSABS  --  1.7  --   --   MONOABS  --  0.5  --   --   EOSABS  --  0.2  --   --   BASOSABS  --  0.0  --   --     Chemistries  Recent Labs  Lab 05/13/19 0520 05/14/19 0428 05/15/19 0549 05/16/19 0423 05/16/19 1033 05/16/19 1707 05/17/19 0451 05/17/19 1707 05/18/19 0606  NA 152* 147* 143 145  --   --   --   --  139  K 3.5 3.1* 3.5 3.2*  --   --   --   --  3.2*  CL 119* 113* 110 110  --   --   --   --  102  CO2 26 26 26 27   --   --   --   --  29  GLUCOSE 205* 187* 178* 155*  --   --   --   --  246*  BUN 25* 18 15 14   --   --   --   --  28*  CREATININE 1.35* 1.37* 1.37* 1.42*  --   --   --   --  1.21  CALCIUM 7.7* 7.6* 7.5* 7.6*  --   --   --   --  7.7*  MG  --   --   --   --  1.9 2.0 2.0 1.9  --    ------------------------------------------------------------------------------------------------------------------ Recent Labs    05/17/19 0451 05/18/19 0606    TRIG 125 164*    Lab Results  Component Value Date   HGBA1C 11.3 (H) 05/09/2019   ------------------------------------------------------------------------------------------------------------------ No results for input(s): TSH, T4TOTAL, T3FREE, THYROIDAB in the last 72 hours.  Invalid input(s): FREET3 ------------------------------------------------------------------------------------------------------------------ No results for input(s): VITAMINB12, FOLATE, FERRITIN, TIBC, IRON, RETICCTPCT in the last 72 hours.  Coagulation profile Recent Labs  Lab 05/14/19 1531  INR 1.4*    No results for input(s): DDIMER in the last 72 hours.  Cardiac Enzymes No results for input(s): CKMB, TROPONINI, MYOGLOBIN in the last 168 hours.  Invalid input(s): CK   ------------------------------------------------------------------------------------------------------------------    Component Value Date/Time   BNP 18.0 02/21/2017 0730    Patria Warzecha M.D on 05/18/2019 at 1:09 PM  Go to [email or web address] - for contact info  [facility] - Office  [phone]

## 2019-05-18 NOTE — Progress Notes (Signed)
[facility] Progress Note Patient Name: [name] [name] DOB: May 22, 1946 MRN: [id number]   Date of Service  05/18/2019  HPI/Events of Note  Patient extubated earlier, with significant secretions and weak cough. Now on 100% NRB mask to maintain sats in the low 90s. Seen not in distress but appears weak.  eICU Interventions  NT suctioning ordered, hopefully this will avoid need for re-intubation     Intervention Category Intermediate Interventions: Other:  Judd Lien 05/18/2019, 8:56 PM

## 2019-05-18 NOTE — Progress Notes (Signed)
Called [facility] earlier and had to get an NT suction order for patient.  Patient had started desating into the lower 80s on the salter HFNC.  Put patient on NRB till order came through.  NT suctioned patient and got moderate amount of mucus out.  Placed patient back on [place] HFNC.  Sats have improved significantly.  Patient currently at 98% on 8L HFNC.  Will continue to wean O2 as able.  Will continue to monitor.

## 2019-05-18 NOTE — Procedures (Signed)
Extubation Procedure Note  Patient Details:   Name: [name] [name] DOB: [birth year] MRN: [id number]   Airway Documentation:  Airway 7.5 mm (Active)  Secured at (cm) 23 cm 05/18/19 0900  Measured From Lips 05/18/19 0900  [place] 05/18/19 0900  Secured By [employer] 05/18/19 0900  Tube Holder Repositioned Yes 05/18/19 0900  Cuff Pressure (cm H2O) 30 cm H2O 05/18/19 0900  Site Condition Dry 05/18/19 0900   Vent end date: (not recorded) Vent end time: (not recorded)   Evaluation  O2 sats: 92 Complications: none noted Patient tolerated procedure well. Bilateral Breath Sounds: Diminished   Pt placed on 5lpm [place] at this time. He will be monitored and titrated as seen fit.   Lucianne Muss 05/18/2019, 12:35 PM

## 2019-05-19 ENCOUNTER — Inpatient Hospital Stay (HOSPITAL_COMMUNITY): Payer: Medicare Other

## 2019-05-19 DIAGNOSIS — Z931 Gastrostomy status: Secondary | ICD-10-CM

## 2019-05-19 LAB — GLUCOSE, CAPILLARY
Glucose-Capillary: 194 mg/dL — ABNORMAL HIGH (ref 70–99)
Glucose-Capillary: 209 mg/dL — ABNORMAL HIGH (ref 70–99)
Glucose-Capillary: 215 mg/dL — ABNORMAL HIGH (ref 70–99)
Glucose-Capillary: 218 mg/dL — ABNORMAL HIGH (ref 70–99)
Glucose-Capillary: 221 mg/dL — ABNORMAL HIGH (ref 70–99)
Glucose-Capillary: 224 mg/dL — ABNORMAL HIGH (ref 70–99)
Glucose-Capillary: 225 mg/dL — ABNORMAL HIGH (ref 70–99)

## 2019-05-19 NOTE — Progress Notes (Signed)
Patient Demographics:    [name] [name], is a 73 y.o. male, DOB - Apr 24, 1946, LZJ:[id number]  Admit date - 05/09/2019   Admitting Physician Courage Denton Brick, MD  Outpatient Primary MD for the patient is [name], MD  LOS - 10  Chief Complaint  Patient presents with   Respiratory Distress      Subjective:    [name] [name] extubated yesterday, but became increasingly short of breath afterwards.  He had increasing secretions and required nasotracheal suction.  Remains on high flow nasal cannula.  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   COPD (chronic obstructive pulmonary disease) ([place])   Hyperthyroidism   Dysphagia due to old cerebrovascular accident-on honey thickened liquids   Bil PNA (pneumonia)--suspect aspiration related   Hypernatremia   Acute hypoxic respiratory failure due to bilateral pneumonia   Diabetes mellitus with hyperglycemia--new diagnosis   AKI (acute kidney injury) ([place])   Goals of care, counseling/discussion   Palliative care encounter   Enterococcus faecalis UTI infection   Endotracheally intubated--- on ventilator   PEG (percutaneous endoscopic gastrostomy) status -Placed 05/15/2019  Brief Summary:- 73 y.o. 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, as well as history of hyperthyroidism, HTN and recurrent episodes of sigmoid volvulus admitted on 05/09/2019 from [facility] with hypoxia requiring nonrebreather bag, secondary to bilateral pneumonia probably aspiration  related, also found with severe hyperglycemia and severe hypernatremia without frank DKA -Had PEG on 05/15/2019,  Intubated 05/15/2019 Extubated 05/18/2019   A/p 1)-Sepsis secondary to Bilateral Pneumonia--suspect aspiration related in  the patient with dysphagia--- has penicillin allergy -Pharmacy consult appreciated -Completed cefepime and Flagyl  -Patient met sepsis criteria on admission with transient hypotension requiring aggressive IV fluid boluses, tachypnea, hypoxia, leukocytosis and, reports of fever -COVID-19 test Neg -Sepsis pathophysiology has resolved  2)Bilateral Pneumonia--- suspect recurrent aspiration related,  completed antibiotics as above #1 -Continue n.p.o. status -c/n bronchodilators and mucolytics --Had PEG on 05/15/2019,  -Continue supplemental oxygen and bronchodilators  3)Acute Hypoxic Respiratory Failure--- secondary to #2 above, treat as above #1 and #2, -Chest x-ray from 05/17/2019 noted with bilateral pleural effusions, received IV Lasix,  -Intubated 05/15/2019 -Extubated 05/18/2019 -Chest x-ray 11/28 shows persistent small bilateral pleural effusions as well as opacity at the lower lung regions with consolidation in a portion of the left base -He is continued on high flow nasal cannula and is requiring intermittent deep suction.  4)New onset DM2 severe hyperglycemia without frank DKA--  --A1c 11.3 -Glycemic control continues to improve, -Restart Lantus at 10 units daily Use Novolog/Humalog Sliding scale insulin with Accu-Cheks/Fingersticks as ordered   5)HyperNatremia--sodium is down to 139 from 161,  -it was due to dehydration and free water deficit,  - suspect significant free water deficit PTA in the patient with dysphagia who is on honey thickened liquids and now with severe hyperglycemia and sepsis--  had PEG tube placement on 05/15/2019 to  allow for free water and medication administration -c/n  free water via PEG tube  6)AKI----acute  kidney injury due to severe dehydration in the setting of sepsis with transient hypotension as well as significant dehydration and volume depletion in a patient who is on honey thickened liquids-     creatinine on admission=2.85  ,   baseline creatinine = 1.0 ( 6 months PTA ), creatinine is down to 1.21 with hydration,  renally adjust medications, avoid nephrotoxic agents / dehydration /hypotension -Continue to Hold Diovan and HCTZ  7)History of prior stroke/DVT/PE---  restarted Eliquis   8)Dysphagia--- patient with prior stroke with right hemiparesis and Dysphagia, PTA was on honey thickened liquids.   -Patient and his POA/brother [name] [name] have  decided to pursue  PEG/G-tube--so he can have free water administered and reduce risk for aspiration as well as reduce risk of AKI/dehydration/hypernatremia -Continue vital HP PEG tube feeding currently at 35 mL an hour via PEG tube, continue water flushes  9)Social/Ethics- Patient is a poor historian due to dysarthria from prior stroke  -Discussed with patient's brother/POA -[name] [name] at [phone]  -Plan of care and advanced directive discussed, patient is a full code  10)H/o Prior CVA with residual right sided hemiparesis and Dysphagia--- continue Eliquis and Lipitor  11)Hyperthyroidism: -Continue methimazole and labetalol  12)History of recurrent sigmoid volvulus and chronic constipation--CT abdomen and pelvis without evidence of volvulus or SBO at this time, laxatives as ordered  13) Enterococcus faecalis UTI--resistant to quinolones, patient is allergy to penicillins, he completed a course of Macrobid   Disposition/Need for in-Hospital Stay- patient unable to be discharged at this time due to tenuous respiratory status.  Needs continued monitoring since he is at high risk for decompensation.  Code Status : Full Code  Family Communication:  Discussed with patient's brother/POA -[name] [name] [name] at [phone]--[id number] times during this  hospitalization (last update 05/19/2019)  Procedures-  -Had PEG on 05/15/2019,  Intubated 05/15/2019 Extubated 05/18/2019  Disposition Plan  : TBD  Antibiotic therapy Vancomycin 11/18>>11/18 Cefepime 11/18 >> 11/25 Flagyl 11/18>>11/25 Macrobid 11/20 >> 11/27  Consults  : Pulmonologist Dr. Luan Pulling  DVT Prophylaxis  : Eliquis- SCDs /   Lab Results  Component Value Date   PLT 227 05/18/2019    Inpatient Medications  Scheduled Meds:  apixaban  5 mg Per Tube BID   atorvastatin  10 mg Per Tube Daily   bisacodyl  10 mg Rectal QHS   chlorhexidine gluconate (MEDLINE KIT)  15 mL Mouth Rinse BID   Chlorhexidine Gluconate Cloth  6 each Topical Daily   feeding supplement (PRO-STAT SUGAR FREE 64)  60 mL Per Tube BID   feeding supplement (VITAL HIGH PROTEIN)  1,000 mL Per Tube Q24H   free water  200 mL Per Tube Q4H   insulin aspart  0-6 Units Subcutaneous Q4H   insulin glargine  10 Units Subcutaneous Daily   ipratropium-albuterol  3 mL Nebulization TID   labetalol  100 mg Per Tube BID   mouth rinse  15 mL Mouth Rinse 10 times per day   methimazole  5 mg Per Tube Daily   multivitamin with minerals  1 tablet Per Tube Daily   pantoprazole sodium  20 mg Per Tube BID   polyethylene glycol  17 g Per Tube Daily   polyvinyl alcohol  1 drop Right Eye TID   senna-docusate  2 tablet Per Tube QHS   sodium chloride flush  3 mL Intravenous Q12H   Continuous Infusions:  sodium  chloride     PRN Meds:.sodium chloride, acetaminophen **OR** acetaminophen, albuterol, dextrose, ondansetron **OR** ondansetron (ZOFRAN) IV, sennosides, sodium chloride flush, traZODone   Anti-infectives (From admission, onward)   Start     Dose/Rate Route Frequency Ordered Stop   05/15/19 0600  ciprofloxacin (CIPRO) IVPB 400 mg     400 mg 200 mL/hr over 60 Minutes Intravenous On call to O.R. 05/14/19 1951 05/15/19 0804   05/11/19 1600  vancomycin (VANCOCIN) 1,500 mg in sodium chloride 0.9 %  500 mL IVPB  Status:  Discontinued     1,500 mg 250 mL/hr over 120 Minutes Intravenous Every 48 hours 05/09/19 1704 05/10/19 0809   05/11/19 1130  nitrofurantoin (macrocrystal-monohydrate) (MACROBID) capsule 100 mg     100 mg Oral Every 12 hours 05/11/19 1125 05/18/19 0959   05/10/19 1400  ceFEPIme (MAXIPIME) 2 g in sodium chloride 0.9 % 100 mL IVPB  Status:  Discontinued     2 g 200 mL/hr over 30 Minutes Intravenous Every 24 hours 05/09/19 1704 05/10/19 0813   05/10/19 1000  ceFEPIme (MAXIPIME) 2 g in sodium chloride 0.9 % 100 mL IVPB  Status:  Discontinued     2 g 200 mL/hr over 30 Minutes Intravenous Every 12 hours 05/10/19 0813 05/16/19 0813   05/09/19 1900  metroNIDAZOLE (FLAGYL) IVPB 500 mg  Status:  Discontinued     500 mg 100 mL/hr over 60 Minutes Intravenous Every 8 hours 05/09/19 1842 05/16/19 0813   05/09/19 1400  vancomycin (VANCOCIN) IVPB 1000 mg/200 mL premix     1,000 mg 200 mL/hr over 60 Minutes Intravenous Every 1 hr x 2 05/09/19 1302 05/09/19 1643   05/09/19 1300  vancomycin (VANCOCIN) IVPB 1000 mg/200 mL premix  Status:  Discontinued     1,000 mg 200 mL/hr over 60 Minutes Intravenous  Once 05/09/19 1256 05/09/19 1302   05/09/19 1300  ceFEPIme (MAXIPIME) 2 g in sodium chloride 0.9 % 100 mL IVPB     2 g 200 mL/hr over 30 Minutes Intravenous  Once 05/09/19 1256 05/09/19 1349        Objective:   Vitals:   05/19/19 1543 05/19/19 1600 05/19/19 1700 05/19/19 1800  BP:  (!) 162/98 (!) 148/91 (!) 154/90  Pulse:  87 86 86  Resp:  (!) 21 (!) 26 (!) 23  Temp: 98 F (36.7 C)     TempSrc: Oral     SpO2:  91% 92% 96%  Weight:      Height:        Wt Readings from Last 3 Encounters:  05/19/19 105 kg  05/13/19 106.1 kg  05/08/19 106.4 kg     Intake/Output Summary (Last 24 hours) at 05/19/2019 1819 Last data filed at 05/19/2019 1529 Gross per 24 hour  Intake 100 ml  Output 500 ml  Net -400 ml   Physical Exam  General exam: Alert, awake, no  distress Respiratory system: Bilateral rhonchi. Respiratory effort normal. Cardiovascular system:RRR. No murmurs, rubs, gallops. Gastrointestinal system: Abdomen is nondistended, soft and nontender. No organomegaly or masses felt. Normal bowel sounds heard.  PEG tube in place Central nervous system: Residual right-sided hemiparesis, residual dysarthria from prior stroke, no new focal deficits Extremities: No C/C/E, +pedal pulses Skin: No rashes, lesions or ulcers Psychiatry: Judgement and insight appear normal. Mood & affect appropriate.      Data Review:   Micro Results Recent Results (from the past 240 hour(s))  MRSA PCR Screening     Status: None   Collection Time:  05/09/19  8:47 PM   Specimen: Nasal Mucosa; Nasopharyngeal  Result Value Ref Range Status   MRSA by PCR NEGATIVE NEGATIVE Final    Comment:        The GeneXpert MRSA Assay (FDA approved for NASAL specimens only), is one component of a comprehensive MRSA colonization surveillance program. It is not intended to diagnose MRSA infection nor to guide or monitor treatment for MRSA infections. Performed at [facility], [address] [place], [place] [address]    Radiology Reports Ct Abdomen Pelvis Wo Contrast  Result Date: 05/09/2019 CLINICAL DATA:  Abdominal distension. Possible volvulus. EXAM: CT ABDOMEN AND PELVIS WITHOUT CONTRAST TECHNIQUE: Multidetector CT imaging of the abdomen and pelvis was performed following the standard protocol without IV contrast. COMPARISON:  11/03/2016 FINDINGS: Lower chest: Motion artifact through the lung bases with consolidative opacities in the lower lobes and to a lesser extent posterior lingula and right middle lobe. No pleural effusion. Coronary atherosclerosis. Hepatobiliary: 2 cm cyst inferiorly in the right hepatic lobe, mildly enlarged from 2018. Contracted gallbladder. No biliary dilatation. Pancreas: Unremarkable. Spleen: Unremarkable. Adrenals/Urinary Tract: Unremarkable  adrenal glands. Unchanged small calcifications in both renal hila which may represent vascular calcifications or non-obstructing calculi. No hydroureteronephrosis. Unremarkable bladder. Stomach/Bowel: The stomach is collapsed. There is no small bowel dilatation. There is a moderately large amount of stool in the distal sigmoid colon and rectum. The more proximal sigmoid colon is redundant and moderately distended by gas. An ordinary amount of stool is present in the more proximal colon, which is nondilated. Left-sided colonic diverticulosis is noted without evidence of diverticulitis. The appendix is unremarkable. Vascular/Lymphatic: Aortic atherosclerosis without aneurysm. No enlarged lymph nodes. Reproductive: Unremarkable prostate. Other: No ascites or pneumoperitoneum. Fat containing paraumbilical hernia. Musculoskeletal: No acute osseous abnormality or suspicious osseous lesion. Mild lumbar levoscoliosis. Advanced lumbar spondylosis with interbody and facet ankylosis at L4-5. IMPRESSION: 1. Moderately large amount of stool in the distal sigmoid colon and rectum. Moderate gaseous distension of the more proximal sigmoid colon without evidence of volvulus or bowel obstruction. 2. Bibasilar lung consolidation concerning for pneumonia. 3. Aortic Atherosclerosis (ICD10-I70.0). Electronically Signed   By: Logan Bores M.D.   On: 05/09/2019 15:45   Dg Chest Port 1 View  Result Date: 05/19/2019 CLINICAL DATA:  Shortness of breath EXAM: PORTABLE CHEST 1 VIEW COMPARISON:  May 17, 2019 FINDINGS: Endotracheal tube has been removed. No pneumothorax. There is been partial but incomplete clearing of patchy infiltrate from the bases. There is residual consolidation in a portion of the left base with patchy opacity in each lower lung region remaining. There are small pleural effusions. Heart is upper normal in size with pulmonary vascularity normal. No adenopathy. No bone lesions. IMPRESSION: Partial but incomplete  clearing of opacity from the basis. Patchy airspace opacity remains in the lower lung regions with consolidation in a portion of the left base. There are small pleural effusions bilaterally. Upper lung regions appear clear. Heart upper normal in size. No adenopathy demonstrable by radiography. Electronically Signed   By: Lowella Grip III M.D.   On: 05/19/2019 08:34   Dg Chest Port 1 View  Result Date: 05/17/2019 CLINICAL DATA:  Respiratory distress, intubated EXAM: PORTABLE CHEST 1 VIEW COMPARISON:  Radiograph 05/16/2019 FINDINGS: Endotracheal tube remains in the mid trachea, 3.5 cm from the carina. Gradient density is obscuring both lung bases likely reflecting pleural fluid. Some persistent airspace opacities noted throughout both lungs as well. No pneumothorax. Cardiomediastinal contours are similar to prior including a calcified,  tortuous aorta. No acute osseous or soft tissue abnormality. Degenerative changes are present in the imaged spine and shoulders. IMPRESSION: 1. Endotracheal tube remains in the mid trachea, 3.5 cm from the carina. 2. Bilateral pleural effusions with associated airspace opacities similar to comparison. 3. No pneumothorax or pneumomediastinum. Electronically Signed   By: Lovena Le M.D.   On: 05/17/2019 05:14   Dg Chest Port 1 View  Result Date: 05/16/2019 CLINICAL DATA:  Status post intubation. EXAM: PORTABLE CHEST 1 VIEW COMPARISON:  Single-view of the chest 05/15/2019. FINDINGS: New endotracheal tube is in place with the tip in good position at the level of the clavicular heads. Right worse than left airspace disease has progressed since yesterday's examination. Heart size is upper normal. There are likely small bilateral pleural effusions. No pneumothorax. IMPRESSION: ETT in good position. Worsened bilateral airspace disease small pleural effusions. Electronically Signed   By: Inge Rise M.D.   On: 05/16/2019 11:57   Dg Chest Port 1 View  Result Date:  05/15/2019 CLINICAL DATA:  Dyspnea and sepsis EXAM: PORTABLE CHEST 1 VIEW COMPARISON:  Two days ago FINDINGS: Cardiomegaly and aortic tortuosity. Low volume chest with hazy density at the left more than right base. No pneumothorax or air bronchogram. Artifact from hardware. IMPRESSION: Low volume chest with hazy opacity at the bases correlating with atelectasis on most recent abdominal CT. Cardiomegaly. Electronically Signed   By: Monte Fantasia M.D.   On: 05/15/2019 07:28   Dg Chest Port 1 View  Result Date: 05/13/2019 CLINICAL DATA:  Sepsis. EXAM: PORTABLE CHEST 1 VIEW COMPARISON:  05/09/2019 FINDINGS: 0528 hours. Lordotic position. Low lung volumes. The cardio pericardial silhouette is enlarged. There is pulmonary vascular congestion without overt pulmonary edema. Bibasilar collapse/consolidation again noted, left greater than right. IMPRESSION: Cardiomegaly with low lung volumes and bibasilar collapse/consolidation, left greater than right. Electronically Signed   By: Misty Stanley M.D.   On: 05/13/2019 07:13   Dg Chest Port 1 View  Result Date: 05/09/2019 CLINICAL DATA:  Shortness of breath.  Hypoxia. EXAM: PORTABLE CHEST 1 VIEW COMPARISON:  January 16, 2019. FINDINGS: Stable cardiomediastinal silhouette. No pneumothorax is noted. Hypoinflation of the lungs is noted. Mild bibasilar atelectasis or infiltrates are noted. Bony thorax is unremarkable. IMPRESSION: Mild bibasilar subsegmental atelectasis or infiltrates are noted. Electronically Signed   By: Marijo Conception M.D.   On: 05/09/2019 13:26    CBC Recent Labs  Lab 05/13/19 0520 05/15/19 0549 05/16/19 0423 05/18/19 0606  WBC 8.6 8.2 8.0 7.8  HGB 14.2 12.9* 12.5* 13.3  HCT 48.2 42.2 41.0 43.2  PLT 166 165 177 227  MCV 104.6* 100.2* 99.5 99.1  MCH 30.8 30.6 30.3 30.5  MCHC 29.5* 30.6 30.5 30.8  RDW 14.0 13.3 13.2 13.8  LYMPHSABS  --  1.7  --   --   MONOABS  --  0.5  --   --   EOSABS  --  0.2  --   --   BASOSABS  --  0.0  --   --      Chemistries  Recent Labs  Lab 05/14/19 0428 05/15/19 0549 05/16/19 0423 05/16/19 1033 05/16/19 1707 05/17/19 0451 05/17/19 1707 05/18/19 0606 05/18/19 1324  NA 147* 143 145  --   --   --   --  139 139  K 3.1* 3.5 3.2*  --   --   --   --  3.2* 4.1  CL 113* 110 110  --   --   --   --  102 100  CO2 _0 --   --   --   --  29 30  GLUCOSE 187* 178* 155*  --   --   --   --  246* 267*  BUN _1 --   --   --   --  28* 30*  CREATININE 1.37* 1.37* 1.42*  --   --   --   --  1.21 1.29*  CALCIUM 7.6* 7.5* 7.6*  --   --   --   --  7.7* 7.9*  MG  --   --   --  1.9 2.0 2.0 1.9  --   --    ------------------------------------------------------------------------------------------------------------------ Recent Labs    05/17/19 0451 05/18/19 0606  TRIG 125 164*    Lab Results  Component Value Date   HGBA1C 11.3 (H) 05/09/2019   ------------------------------------------------------------------------------------------------------------------ No results for input(s): TSH, T4TOTAL, T3FREE, THYROIDAB in the last 72 hours.  Invalid input(s): FREET3 ------------------------------------------------------------------------------------------------------------------ No results for input(s): VITAMINB12, FOLATE, FERRITIN, TIBC, IRON, RETICCTPCT in the last 72 hours.  Coagulation profile Recent Labs  Lab 05/14/19 1531  INR 1.4*    No results for input(s): DDIMER in the last 72 hours.  Cardiac Enzymes No results for input(s): CKMB, TROPONINI, MYOGLOBIN in the last 168 hours.  Invalid input(s): CK   ------------------------------------------------------------------------------------------------------------------    Component Value Date/Time   BNP 18.0 02/21/2017 0730    Kathie Dike M.D on 05/19/2019 at 6:19 PM  Go to [email or web address] - for contact info  [facility] - Office  [phone]

## 2019-05-19 NOTE — Progress Notes (Signed)
Having to NTS[employer] patient obtaining  A moderate amount of thick yellow secretions.

## 2019-05-19 NOTE — Progress Notes (Signed)
Pt PEG tube tubing changed along with hanging new bag of feeding supplement. Will continue to monitor.

## 2019-05-20 DIAGNOSIS — R0689 Other abnormalities of breathing: Secondary | ICD-10-CM

## 2019-05-20 DIAGNOSIS — R0600 Dyspnea, unspecified: Secondary | ICD-10-CM

## 2019-05-20 DIAGNOSIS — R739 Hyperglycemia, unspecified: Secondary | ICD-10-CM

## 2019-05-20 LAB — COMPREHENSIVE METABOLIC PANEL
ALT: 43 U/L (ref 0–44)
AST: 51 U/L — ABNORMAL HIGH (ref 15–41)
Albumin: 2.4 g/dL — ABNORMAL LOW (ref 3.5–5.0)
Alkaline Phosphatase: 51 U/L (ref 38–126)
Anion gap: 12 (ref 5–15)
BUN: 25 mg/dL — ABNORMAL HIGH (ref 8–23)
CO2: 29 mmol/L (ref 22–32)
Calcium: 8.3 mg/dL — ABNORMAL LOW (ref 8.9–10.3)
Chloride: 97 mmol/L — ABNORMAL LOW (ref 98–111)
Creatinine, Ser: 1.1 mg/dL (ref 0.61–1.24)
GFR calc Af Amer: >60 mL/min (ref >60)
GFR calc non Af Amer: >60 mL/min (ref >60)
Glucose, Bld: 190 mg/dL — ABNORMAL HIGH (ref 70–99)
Potassium: 3.7 mmol/L (ref 3.5–5.1)
Sodium: 138 mmol/L (ref 135–145)
Total Bilirubin: 0.2 mg/dL — ABNORMAL LOW (ref 0.3–1.2)
Total Protein: 7.5 g/dL (ref 6.5–8.1)

## 2019-05-20 LAB — GLUCOSE, CAPILLARY
Glucose-Capillary: 164 mg/dL — ABNORMAL HIGH (ref 70–99)
Glucose-Capillary: 178 mg/dL — ABNORMAL HIGH (ref 70–99)
Glucose-Capillary: 180 mg/dL — ABNORMAL HIGH (ref 70–99)
Glucose-Capillary: 188 mg/dL — ABNORMAL HIGH (ref 70–99)
Glucose-Capillary: 192 mg/dL — ABNORMAL HIGH (ref 70–99)
Glucose-Capillary: 219 mg/dL — ABNORMAL HIGH (ref 70–99)

## 2019-05-20 LAB — CBC
HCT: 44 % (ref 39.0–52.0)
Hemoglobin: 13.5 g/dL (ref 13.0–17.0)
MCH: 30.3 pg (ref 26.0–34.0)
MCHC: 30.7 g/dL (ref 30.0–36.0)
MCV: 98.9 fL (ref 80.0–100.0)
Platelets: 264 10*3/uL (ref 150–400)
RBC: 4.45 MIL/uL (ref 4.22–5.81)
RDW: 13.4 % (ref 11.5–15.5)
WBC: 9.5 10*3/uL (ref 4.0–10.5)
nRBC: 0 % (ref 0.0–0.2)

## 2019-05-20 MED ORDER — INSULIN GLARGINE 100 UNIT/ML ~~LOC~~ SOLN
12.0000 [IU] | Freq: Every day | SUBCUTANEOUS | Status: DC
  Administered 2019-05-21 – 2019-05-23 (×3): 12 [IU] via SUBCUTANEOUS
  Filled 2019-05-20 (×4): qty 0.12

## 2019-05-20 MED ORDER — SCOPOLAMINE 1 MG/3DAYS TD PT72
1.0000 | MEDICATED_PATCH | TRANSDERMAL | Status: DC
  Administered 2019-05-20 – 2019-05-23 (×2): 1.5 mg via TRANSDERMAL
  Filled 2019-05-20 (×2): qty 1

## 2019-05-20 NOTE — Progress Notes (Signed)
Patient Demographics:    [name] [name], is a 73 y.o. male, DOB - 1946/02/07, PZW:[id number]  Admit date - 05/09/2019   Admitting Physician Courage Denton Brick, MD  Outpatient Primary MD for the patient is [name], MD  LOS - 11  Chief Complaint  Patient presents with   Respiratory Distress      Subjective:    [name] [name] still requiring high flow oxygen supplementation; with mild difficulty handling his secretions and requiring deep nasotracheal suctioning.  No fever, no chest pain, no nausea, no vomiting.  So far tolerating tube feedings and free water through PEG tube.   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   COPD (chronic obstructive pulmonary disease) ([place])   Hyperthyroidism   Dysphagia due to old cerebrovascular accident-on honey thickened liquids   Bil PNA (pneumonia)--suspect aspiration related   Hypernatremia   Acute hypoxic respiratory failure due to bilateral pneumonia   Diabetes mellitus with hyperglycemia--new diagnosis   AKI (acute kidney injury) ([place])   Goals of care, counseling/discussion   Palliative care encounter   Enterococcus faecalis UTI infection   Endotracheally intubated--- on ventilator   PEG (percutaneous endoscopic gastrostomy) status -Placed 05/15/2019  Brief Summary:- 73 y.o. 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, as well as history of hyperthyroidism, HTN and recurrent episodes of sigmoid volvulus admitted on 05/09/2019 from [facility] with hypoxia  requiring nonrebreather bag, secondary to bilateral pneumonia probably aspiration related, also found with severe hyperglycemia and severe hypernatremia without frank DKA -Had PEG on 05/15/2019,  Intubated 05/15/2019 Extubated 05/18/2019   A/p 1)-Sepsis secondary to Bilateral Pneumonia--suspect aspiration related in  the patient with dysphagia--- has penicillin allergy -Pharmacy consult appreciated -Completed cefepime and Flagyl  -Patient met sepsis criteria on admission with transient hypotension requiring aggressive IV fluid boluses, tachypnea, hypoxia, leukocytosis and, reports of fever -COVID-19 test Neg -Sepsis pathophysiology has resolved  2)Bilateral Pneumonia--- suspect recurrent aspiration related,  completed antibiotics as above #1 -Continue n.p.o. status -Continue bronchodilators and mucolytics -Started on flutter valve, scopolamine patch for further assistance with secretion management and chest physiotherapy  3)Acute Hypoxic Respiratory Failure--- secondary to #2 above, treat as above #1 and #2, -Chest x-ray from 05/17/2019 noted with bilateral pleural effusions, received IV Lasix,  -Intubated 05/15/2019 -Extubated 05/18/2019 -Chest x-ray 11/28 shows persistent small bilateral pleural effusions as well as opacity at the lower lung regions with consolidation in a portion of the left base. -Patient has completed treatment with antibiotics for aspiration related pneumonia. -He is continued on high flow nasal cannula and is requiring intermittent deep suction. -Started on scopolamine patch to assist with secretion control, chest PT on flutter valve.  4)New onset DM2 severe hyperglycemia without frank DKA--  --A1c 11.3 -Glycemic control continues to improve, -Restart Lantus at 12 units daily -Continue sliding scale insulin and further adjust hypoglycemic regimen as needed based on CBGs fluctuation.  5)HyperNatremia--sodium is down to 138 from 161,   -it was due to  dehydration and free water deficit,  -suspected significant free water deficit PTA in the patient with dysphagia who is on honey thickened liquids. -Patient also presented with sepsis and hyperglycemia. -Continue to maintain appropriate hydration and continue free water through PEG tube.  6)AKI----acute kidney injury due to severe dehydration in the setting of sepsis with transient hypotension as well as significant dehydration and volume depletion. -creatinine on admission=2.85  ,   baseline creatinine = 1.0 ( 6 months PTA ), -creatinine is down to 1.10 with hydration -Continue to Hold Diovan and HCTZ  7)History of prior stroke/DVT/PE- -continue eliquis   8)Dysphagia--- patient with prior stroke with right hemiparesis and Dysphagia, PTA was on honey thickened liquids.   -Patient and his POA/brother [name] have  decided to pursue  PEG/G-tube--so he can have free water administered and nutrition. -hopefully preventing future AKI/dehydration/hypernatremia -Continue vital HP PEG tube feeding currently at 35 mL an hour via PEG tube, continue water flushes  9)Social/Ethics- Patient is a poor historian due to dysarthria from prior stroke  -Discussed with patient's brother/POA -Mr Guilford Shannahan at [phone]  -Plan of care and advanced directive discussed, patient is a full code  10)H/o Prior CVA with residual right sided hemiparesis and Dysphagia/dysarthria -continue Eliquis and Lipitor for secondary prevention.  11)Hyperthyroidism:  -Continue methimazole and labetalol  12)History of recurrent sigmoid volvulus and chronic constipation- -CT abdomen and pelvis without evidence of volvulus or SBO at this time, laxatives as ordered -reports having BM and denies abd pain.  13) Enterococcus faecalis UTI- -resistant to quinolones, patient is allergy to penicillins, he completed a course of Macrobid -no dysuria    Disposition/Need for in-Hospital Stay- patient unable to be discharged at this  time due to tenuous respiratory status.  Needs continued monitoring since he is at high risk for decompensation and reintubation. Will observe for another 24 hours in stepdown..  Code Status : Full Code  Family Communication:  Discussed with patient's brother/POA -Mr Jovonni Borquez at [phone]--[id number] times during this hospitalization (last update 05/19/2019)  Procedures- -Had PEG on 05/15/2019,  Intubated 05/15/2019 Extubated 05/18/2019  Disposition Plan  : TBD  Antibiotic therapy Vancomycin 11/18>>11/18 Cefepime 11/18 >> 11/25 Flagyl 11/18>>11/25 Macrobid 11/20 >> 11/27  Consults  : Pulmonologist Dr. Luan Pulling  DVT Prophylaxis  : Eliquis-   Lab Results  Component Value Date   PLT 264 05/20/2019    Inpatient Medications  Scheduled Meds:  apixaban  5 mg Per Tube BID   atorvastatin  10 mg Per Tube Daily   bisacodyl  10 mg Rectal QHS   chlorhexidine gluconate (MEDLINE KIT)  15 mL Mouth Rinse BID   Chlorhexidine Gluconate Cloth  6 each Topical Daily   feeding supplement (PRO-STAT SUGAR FREE 64)  60 mL Per Tube BID   feeding supplement (VITAL HIGH PROTEIN)  1,000 mL Per Tube Q24H   free water  200 mL Per Tube Q4H   insulin aspart  0-6 Units Subcutaneous Q4H   insulin glargine  10 Units Subcutaneous Daily   ipratropium-albuterol  3 mL Nebulization TID   labetalol  100 mg Per Tube BID   methimazole  5 mg Per Tube Daily   multivitamin with minerals  1 tablet Per Tube Daily   pantoprazole sodium  20 mg Per Tube BID   polyethylene glycol  17 g Per Tube Daily   polyvinyl alcohol  1 drop Right Eye TID   scopolamine  1 patch Transdermal Q72H   senna-docusate  2 tablet Per Tube QHS   sodium chloride flush  3 mL Intravenous Q12H   Continuous Infusions:  sodium chloride     PRN Meds:.sodium chloride, acetaminophen **OR** acetaminophen, albuterol, dextrose, ondansetron **OR** ondansetron (ZOFRAN) IV, sennosides, sodium chloride flush,  traZODone   Anti-infectives (From admission, onward)   Start     Dose/Rate Route Frequency Ordered Stop   05/15/19 0600  ciprofloxacin (CIPRO) IVPB 400 mg     400 mg 200 mL/hr over 60 Minutes Intravenous On call to O.R. 05/14/19 1951 05/15/19 0804   05/11/19 1600  vancomycin (VANCOCIN) 1,500 mg in sodium chloride 0.9 % 500 mL IVPB  Status:  Discontinued     1,500 mg 250 mL/hr over 120 Minutes Intravenous Every 48 hours 05/09/19 1704 05/10/19 0809   05/11/19 1130  nitrofurantoin (macrocrystal-monohydrate) (MACROBID) capsule 100 mg     100 mg Oral Every 12 hours 05/11/19 1125 05/18/19 0959   05/10/19 1400  ceFEPIme (MAXIPIME) 2 g in sodium chloride 0.9 % 100 mL IVPB  Status:  Discontinued     2 g 200 mL/hr over 30 Minutes Intravenous Every 24 hours 05/09/19 1704 05/10/19 0813   05/10/19 1000  ceFEPIme (MAXIPIME) 2 g in sodium chloride 0.9 % 100 mL IVPB  Status:  Discontinued     2 g 200 mL/hr over 30 Minutes Intravenous Every 12 hours 05/10/19 0813 05/16/19 0813   05/09/19 1900  metroNIDAZOLE (FLAGYL) IVPB 500 mg  Status:  Discontinued     500 mg 100 mL/hr over 60 Minutes Intravenous Every 8 hours 05/09/19 1842 05/16/19 0813   05/09/19 1400  vancomycin (VANCOCIN) IVPB 1000 mg/200 mL premix     1,000 mg 200 mL/hr over 60 Minutes Intravenous Every 1 hr x 2 05/09/19 1302 05/09/19 1643   05/09/19 1300  vancomycin (VANCOCIN) IVPB 1000 mg/200 mL premix  Status:  Discontinued     1,000 mg 200 mL/hr over 60 Minutes Intravenous  Once 05/09/19 1256 05/09/19 1302   05/09/19 1300  ceFEPIme (MAXIPIME) 2 g in sodium chloride 0.9 % 100 mL IVPB     2 g 200 mL/hr over 30 Minutes Intravenous  Once 05/09/19 1256 05/09/19 1349        Objective:   Vitals:   05/20/19 1131 05/20/19 1200 05/20/19 1600 05/20/19 1710  BP:  (!) 142/90 (!) 149/92   Pulse:  78 80   Resp:  (!) 22 (!) 21   Temp: 98.3 F (36.8 C)   98.2 F (36.8 C)  TempSrc: Oral   Axillary  SpO2:  99% 99%   Weight:      Height:         Wt Readings from Last 3 Encounters:  05/20/19 106 kg  05/13/19 106.1 kg  05/08/19 106.4 kg     Intake/Output Summary (Last 24 hours) at 05/20/2019 1723 Last data filed at 05/20/2019 1600 Gross per 24 hour  Intake 100 ml  Output 900 ml  Net -800 ml   Physical Exam General exam: Alert, oriented and following commands appropriately.  No fever. Respiratory system: decrease BS at bases, diffuse rhonchi and increase upper airway secretions.  Cardiovascular system:RRR. No murmurs, rubs, gallops. Gastrointestinal system: Abdomen is nondistended, soft and nontender. No organomegaly or masses felt. Normal bowel sounds heard.  PEG tube in place. Central nervous system: No new focal deficits.  Patient with residual right hemiparesis and dysarthria from previous CVA. Extremities: No cyanosis or clubbing appreciated. Skin: No petechiae.  Psychiatry: Mood & affect appropriate.     Data Review:   Micro Results No results found for this or any previous visit (from the past 240 hour(s)). Radiology Reports Ct Abdomen Pelvis Wo Contrast  Result Date: 05/09/2019 CLINICAL DATA:  Abdominal distension. Possible volvulus. EXAM: CT ABDOMEN AND PELVIS WITHOUT CONTRAST TECHNIQUE: Multidetector CT imaging of the abdomen and pelvis was performed following the standard protocol without IV contrast. COMPARISON:  11/03/2016 FINDINGS: Lower chest: Motion artifact through the lung bases with consolidative opacities in the lower lobes and to a lesser extent posterior lingula and right middle lobe. No pleural effusion. Coronary atherosclerosis. Hepatobiliary: 2 cm cyst inferiorly in the right hepatic lobe, mildly enlarged from 2018. Contracted gallbladder. No biliary dilatation. Pancreas: Unremarkable. Spleen: Unremarkable. Adrenals/Urinary Tract: Unremarkable adrenal glands. Unchanged small calcifications in both renal hila which may represent vascular calcifications or non-obstructing calculi. No  hydroureteronephrosis. Unremarkable bladder. Stomach/Bowel: The stomach is collapsed. There is no small bowel dilatation. There is a moderately large amount of stool in the distal sigmoid colon and rectum. The more proximal sigmoid colon is redundant and moderately distended by gas. An ordinary amount of stool is present in the more proximal colon, which is nondilated. Left-sided colonic diverticulosis is noted without evidence of diverticulitis. The appendix is unremarkable. Vascular/Lymphatic: Aortic atherosclerosis without aneurysm. No enlarged lymph nodes. Reproductive: Unremarkable prostate. Other: No ascites or pneumoperitoneum. Fat containing paraumbilical hernia. Musculoskeletal: No acute osseous abnormality or suspicious osseous lesion. Mild lumbar levoscoliosis. Advanced lumbar spondylosis with interbody and facet ankylosis at L4-5. IMPRESSION: 1. Moderately large amount of stool in the distal sigmoid colon and rectum. Moderate gaseous distension of the more proximal sigmoid colon without evidence of volvulus or bowel obstruction. 2. Bibasilar lung consolidation concerning for pneumonia. 3. Aortic Atherosclerosis (ICD10-I70.0). Electronically Signed   By: Logan Bores M.D.   On: 05/09/2019 15:45   Dg Chest Port 1 View  Result Date: 05/19/2019 CLINICAL DATA:  Shortness of breath EXAM: PORTABLE CHEST 1 VIEW COMPARISON:  May 17, 2019 FINDINGS: Endotracheal tube has been removed. No pneumothorax. There is been partial but incomplete clearing of patchy infiltrate from the bases. There is residual consolidation in a portion of the left base with patchy opacity in each lower lung region remaining. There are small pleural effusions. Heart is upper normal in size with pulmonary vascularity normal. No adenopathy. No bone lesions. IMPRESSION: Partial but incomplete clearing of opacity from the basis. Patchy airspace opacity remains in the lower lung regions with consolidation in a portion of the left base.  There are small pleural effusions bilaterally. Upper lung regions appear clear. Heart upper normal in size. No adenopathy demonstrable by radiography. Electronically Signed   By: Lowella Grip III M.D.   On: 05/19/2019 08:34   Dg Chest Port 1 View  Result Date: 05/17/2019 CLINICAL DATA:  Respiratory distress, intubated EXAM: PORTABLE CHEST 1 VIEW COMPARISON:  Radiograph 05/16/2019 FINDINGS: Endotracheal tube remains in the mid trachea, 3.5 cm from the carina. Gradient density is obscuring both lung bases likely reflecting pleural fluid. Some persistent airspace opacities noted throughout both lungs as well. No pneumothorax. Cardiomediastinal contours are similar to prior including a calcified, tortuous aorta. No acute osseous or soft tissue abnormality. Degenerative changes are present in the imaged spine and shoulders. IMPRESSION: 1. Endotracheal tube remains in the mid trachea, 3.5 cm from the carina. 2. Bilateral pleural effusions with associated airspace opacities similar to comparison. 3. No pneumothorax or pneumomediastinum. Electronically Signed   By: Lovena Le  M.D.   On: 05/17/2019 05:14   Dg Chest Port 1 View  Result Date: 05/16/2019 CLINICAL DATA:  Status post intubation. EXAM: PORTABLE CHEST 1 VIEW COMPARISON:  Single-view of the chest 05/15/2019. FINDINGS: New endotracheal tube is in place with the tip in good position at the level of the clavicular heads. Right worse than left airspace disease has progressed since yesterday's examination. Heart size is upper normal. There are likely small bilateral pleural effusions. No pneumothorax. IMPRESSION: ETT in good position. Worsened bilateral airspace disease small pleural effusions. Electronically Signed   By: Inge Rise M.D.   On: 05/16/2019 11:57   Dg Chest Port 1 View  Result Date: 05/15/2019 CLINICAL DATA:  Dyspnea and sepsis EXAM: PORTABLE CHEST 1 VIEW COMPARISON:  Two days ago FINDINGS: Cardiomegaly and aortic tortuosity. Low  volume chest with hazy density at the left more than right base. No pneumothorax or air bronchogram. Artifact from hardware. IMPRESSION: Low volume chest with hazy opacity at the bases correlating with atelectasis on most recent abdominal CT. Cardiomegaly. Electronically Signed   By: Monte Fantasia M.D.   On: 05/15/2019 07:28   Dg Chest Port 1 View  Result Date: 05/13/2019 CLINICAL DATA:  Sepsis. EXAM: PORTABLE CHEST 1 VIEW COMPARISON:  05/09/2019 FINDINGS: 0528 hours. Lordotic position. Low lung volumes. The cardio pericardial silhouette is enlarged. There is pulmonary vascular congestion without overt pulmonary edema. Bibasilar collapse/consolidation again noted, left greater than right. IMPRESSION: Cardiomegaly with low lung volumes and bibasilar collapse/consolidation, left greater than right. Electronically Signed   By: Misty Stanley M.D.   On: 05/13/2019 07:13   Dg Chest Port 1 View  Result Date: 05/09/2019 CLINICAL DATA:  Shortness of breath.  Hypoxia. EXAM: PORTABLE CHEST 1 VIEW COMPARISON:  January 16, 2019. FINDINGS: Stable cardiomediastinal silhouette. No pneumothorax is noted. Hypoinflation of the lungs is noted. Mild bibasilar atelectasis or infiltrates are noted. Bony thorax is unremarkable. IMPRESSION: Mild bibasilar subsegmental atelectasis or infiltrates are noted. Electronically Signed   By: Marijo Conception M.D.   On: 05/09/2019 13:26    CBC Recent Labs  Lab 05/15/19 0549 05/16/19 0423 05/18/19 0606 05/20/19 0432  WBC 8.2 8.0 7.8 9.5  HGB 12.9* 12.5* 13.3 13.5  HCT 42.2 41.0 43.2 44.0  PLT 165 177 227 264  MCV 100.2* 99.5 99.1 98.9  MCH 30.6 30.3 30.5 30.3  MCHC 30.6 30.5 30.8 30.7  RDW 13.3 13.2 13.8 13.4  LYMPHSABS 1.7  --   --   --   MONOABS 0.5  --   --   --   EOSABS 0.2  --   --   --   BASOSABS 0.0  --   --   --     Chemistries  Recent Labs  Lab 05/15/19 0549 05/16/19 0423 05/16/19 1033 05/16/19 1707 05/17/19 0451 05/17/19 1707 05/18/19 0606  05/18/19 1324 05/20/19 0432  NA 143 145  --   --   --   --  139 139 138  K 3.5 3.2*  --   --   --   --  3.2* 4.1 3.7  CL 110 110  --   --   --   --  102 100 97*  CO2 26 27  --   --   --   --  _0 GLUCOSE 178* 155*  --   --   --   --  246* 267* 190*  BUN 15 14  --   --   --   --  28* 30* 25*  CREATININE 1.37* 1.42*  --   --   --   --  1.21 1.29* 1.10  CALCIUM 7.5* 7.6*  --   --   --   --  7.7* 7.9* 8.3*  MG  --   --  1.9 2.0 2.0 1.9  --   --   --   AST  --   --   --   --   --   --   --   --  51*  ALT  --   --   --   --   --   --   --   --  17  ALKPHOS  --   --   --   --   --   --   --   --  51  BILITOT  --   --   --   --   --   --   --   --  0.2*   ------------------------------------------------------------------------------------------------------------------ Recent Labs    05/18/19 0606  TRIG 164*    Lab Results  Component Value Date   HGBA1C 11.3 (H) 05/09/2019    Coagulation profile Recent Labs  Lab 05/14/19 1531  INR 1.4*    ------------------------------------------------------------------------------------------------------------------    Component Value Date/Time   BNP 18.0 02/21/2017 0730    Barton Dubois M.D on 05/20/2019 at 5:23 PM  Go to [email or web address] - for contact info  [facility] - Office  [phone]

## 2019-05-20 NOTE — Progress Notes (Signed)
NTSX patient obtained yellow thick secretions mixed with blood bright red which caused by trama passing through nare . Clotted before rt left back of throat. Patient did well.

## 2019-05-21 LAB — BASIC METABOLIC PANEL
Anion gap: 9 (ref 5–15)
BUN: 29 mg/dL — ABNORMAL HIGH (ref 8–23)
CO2: 31 mmol/L (ref 22–32)
Calcium: 8.2 mg/dL — ABNORMAL LOW (ref 8.9–10.3)
Chloride: 95 mmol/L — ABNORMAL LOW (ref 98–111)
Creatinine, Ser: 1.1 mg/dL (ref 0.61–1.24)
GFR calc Af Amer: >60 mL/min (ref >60)
GFR calc non Af Amer: >60 mL/min (ref >60)
Glucose, Bld: 199 mg/dL — ABNORMAL HIGH (ref 70–99)
Potassium: 3.8 mmol/L (ref 3.5–5.1)
Sodium: 135 mmol/L (ref 135–145)

## 2019-05-21 LAB — GLUCOSE, CAPILLARY
Glucose-Capillary: 173 mg/dL — ABNORMAL HIGH (ref 70–99)
Glucose-Capillary: 179 mg/dL — ABNORMAL HIGH (ref 70–99)
Glucose-Capillary: 190 mg/dL — ABNORMAL HIGH (ref 70–99)
Glucose-Capillary: 197 mg/dL — ABNORMAL HIGH (ref 70–99)
Glucose-Capillary: 205 mg/dL — ABNORMAL HIGH (ref 70–99)
Glucose-Capillary: 226 mg/dL — ABNORMAL HIGH (ref 70–99)

## 2019-05-21 MED ORDER — VITAL HIGH PROTEIN PO LIQD
1000.0000 mL | ORAL | Status: DC
  Administered 2019-05-21 – 2019-05-23 (×3): 1000 mL

## 2019-05-21 NOTE — Progress Notes (Signed)
Nutrition Follow-up  DOCUMENTATION CODES:   Obesity unspecified  INTERVENTION:  -Increase Vital High Protein-@ 70 ml/hr via PEG (1680 ml daily).   -d/c Prostat via tube BID.   Tube feeding regimen-1680 kcal, 141 gr protein and 1349 ml water.   Free water per MD- 200 ml q 4 hr.-adds 1200 ml free water daily.  NUTRITION DIAGNOSIS:   Inadequate oral intake related to acute illness, dysphagia(sepsis secondary to pneumonia; h/o stroke with residual right-sided hemiparesis;dysphagia) as evidenced by NPO status(s/p PEG placement).   GOAL:  Provide needs based on ASPEN/SCCM guidelines  MONITOR:   Vent status, Labs, Weight trends, I & O's, Skin, TF tolerance  REASON FOR ASSESSMENT:   Consult, Ventilator Enteral/tube feeding initiation and management  ASSESSMENT:   73 year old male with past medical history significant for prior stroke with residual right-sided hemiparesis and dysphagia, recurrent DVT/PE, hypothyroidism, HTN, recurrent episodes of sigmoid volvulus who presented from [facility] with hypoxia requiring nonrebreather bag and reported fevers. CXR with possible pneumonia; CT abdomen and pelvis with bilateral pneumonia and constipation with gaseous distention of proximal sigmoid.  11/27- Patient remains intubated/ sedated-propofol at 13.18 ml/hr (348 kcal/daily). Free water 200 ml q 4 hrs Intubated on 11/24 @ 0817. Last BM-4 days ago. Hx of problems with constipation.  MV: 8.1 L/min Temp (24hrs), Avg:98.4 F (36.9 C), Min:98.1 F (36.7 C), Max:99 F (37.2 C)  11/30-Patient extubated and nutrition needs re-calculated. High risk for re-intubation per MD having problem with significant secretions. Tube feeding has been tolerated well. Advance rate today. Weight and output reviewed. Discussed with nursing.    Intake/Output Summary (Last 24 hours) at 05/21/2019 1204 Last data filed at 05/21/2019 1049 Gross per 24 hour  Intake 388 ml  Output 1500 ml  Net -1112 ml     Medications reviewed and include: Lipitor, Dulcolax  Labs: BMP Latest Ref Rng & Units 05/21/2019 05/20/2019 05/18/2019  Glucose 70 - 99 mg/dL 199(H) 190(H) 267(H)  BUN 8 - 23 mg/dL 29(H) 25(H) 30(H)  Creatinine 0.61 - 1.24 mg/dL 1.10 1.10 1.29(H)  Sodium 135 - 145 mmol/L 135 138 139  Potassium 3.5 - 5.1 mmol/L 3.8 3.7 4.1  Chloride 98 - 111 mmol/L 95(L) 97(L) 100  CO2 22 - 32 mmol/L 31 29 30   Calcium 8.9 - 10.3 mg/dL 8.2(L) 8.3(L) 7.9(L)     Diet Order:   Diet Order            Diet NPO time specified  Diet effective now             EDUCATION NEEDS:   No education needs have been identified at this time  Skin:  Skin Assessment: Reviewed RN Assessment(MASD; groin; incision;closed;abdomen)  Last BM:  11/28  Height:   Ht Readings from Last 1 Encounters:  05/15/19 5\' 8"  (1.727 m)    Weight:   Wt Readings from Last 1 Encounters:  05/21/19 105.3 kg    Ideal Body Weight:  70 kg  BMI:  Body mass index is 35.3 kg/m.  Estimated Nutritional Needs:   Kcal: 1540-1680   Protein:  126-140  Fluid:  per MD   Colman Cater MS,RD,CSG,LDN Office: [phone] Pager: [phone]

## 2019-05-21 NOTE — Progress Notes (Signed)
Patient Demographics:    [name] [name], is a 73 y.o. male, DOB - [birth year], GQQ:[id number]  Admit date - 05/09/2019   Admitting Physician Courage Denton Brick, MD  Outpatient Primary MD for the patient is [name], MD  LOS - 12  Chief Complaint  Patient presents with   Respiratory Distress      Subjective:    [name] [name] still having significant tenuous respiratory status, requiring deep nasotracheal suctioning intermittently.  No chest pain, no nausea, no vomiting.  Tolerating PEG tube.  Still having problem clearing secretions.   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   COPD (chronic obstructive pulmonary disease) ([place])   Hyperthyroidism   Dysphagia due to old cerebrovascular accident-on honey thickened liquids   Bil PNA (pneumonia)--suspect aspiration related   Hypernatremia   Acute hypoxic respiratory failure due to bilateral pneumonia   Diabetes mellitus with hyperglycemia--new diagnosis   AKI (acute kidney injury) ([place])   Goals of care, counseling/discussion   Palliative care encounter   Enterococcus faecalis UTI infection   Endotracheally intubated--- on ventilator   PEG (percutaneous endoscopic gastrostomy) status -Placed 05/15/2019  Brief Summary:- 73 y.o. 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, as well as history of hyperthyroidism, HTN and recurrent episodes of sigmoid volvulus admitted on 05/09/2019 from [facility] with hypoxia requiring nonrebreather bag, secondary to  bilateral pneumonia probably aspiration related, also found with severe hyperglycemia and severe hypernatremia without frank DKA -Had PEG on 05/15/2019,  Intubated 05/15/2019 Extubated 05/18/2019   A/p 1)-Sepsis secondary to Bilateral Pneumonia--suspect aspiration related in  the patient with dysphagia--- has penicillin allergy -Pharmacy consult appreciated -Completed cefepime and Flagyl  -Patient met sepsis criteria on admission with transient hypotension requiring aggressive IV fluid boluses, tachypnea, hypoxia, leukocytosis and, reports of fever -COVID-19 test Neg -Sepsis pathophysiology has resolved  2)Bilateral Pneumonia--- suspect recurrent aspiration related,  completed antibiotics as above #1 -Continue n.p.o. status -Continue bronchodilators and mucolytics -Started on flutter valve, scopolamine patch for further assistance with secretion management and chest physiotherapy  3)Acute Hypoxic Respiratory Failure--- secondary to #2 above, treat as above #1 and #2, -Chest x-ray from 05/17/2019 noted with bilateral pleural effusions, received IV Lasix,  -Intubated 05/15/2019 -Extubated 05/18/2019 -Chest x-ray 11/28 shows persistent small bilateral pleural effusions as well as opacity at the lower lung regions with consolidation in a portion of the left base. -Patient has completed treatment with antibiotics for aspiration related pneumonia. -He is continued on high flow nasal cannula and is requiring intermittent deep suction. -Started on scopolamine patch to assist with secretion control, chest PT on flutter valve.  4)New onset DM2 severe hyperglycemia without frank DKA--  --A1c 11.3 -Glycemic control continues to improve, -Restart Lantus at 12 units daily -Continue sliding scale insulin and further adjust hypoglycemic regimen as needed based on CBGs fluctuation.     5)HyperNatremia--sodium is down to 138 from 161,   -  it was due to dehydration and free water deficit,    -suspected significant free water deficit PTA in the patient with dysphagia who is on honey thickened liquids. -Patient also presented with sepsis and hyperglycemia. -Continue to maintain appropriate hydration and continue free water through PEG tube.  6)AKI----acute kidney injury due to severe dehydration in the setting of sepsis with transient hypotension as well as significant dehydration and volume depletion. -creatinine on admission=2.85  ,   baseline creatinine = 1.0 ( 6 months PTA ), -creatinine is down to 1.10 with hydration -Continue to Hold Diovan and HCTZ  7)History of prior stroke/DVT/PE- -continue eliquis   8)Dysphagia--- patient with prior stroke with right hemiparesis and Dysphagia, PTA was on honey thickened liquids.   -Patient and his POA/brother [name] have  decided to pursue  PEG/G-tube--so he can have free water administered and nutrition. -hopefully preventing future AKI/dehydration/hypernatremia -Continue vital HP PEG tube feeding currently at 35 mL an hour via PEG tube, continue water flushes  9)Social/Ethics- Patient is a poor historian due to dysarthria from prior stroke  -Discussed with patient's brother/POA -Mr [name] [name] at [phone]  -Plan of care and advanced directive discussed, patient is a full code  10)H/o Prior CVA with residual right sided hemiparesis and Dysphagia/dysarthria -continue Eliquis and Lipitor for secondary prevention.  11)Hyperthyroidism:  -Continue methimazole and labetalol  12)History of recurrent sigmoid volvulus and chronic constipation- -CT abdomen and pelvis without evidence of volvulus or SBO at this time, laxatives as ordered -reports having BM and denies abd pain.  13) Enterococcus faecalis UTI- -resistant to quinolones, patient is allergy to penicillins, he completed a course of Macrobid -no dysuria    Disposition/Need for in-Hospital Stay- patient unable to be discharged at this time due to tenuous respiratory  status.  Needs continued monitoring since he is at high risk for decompensation and reintubation. Will observe for another 24 hours in stepdown..  Code Status : Full Code  Family Communication:  Discussed with patient's brother/POA -Mr [name] [name] at [phone]--[id number] times during this hospitalization (last update 05/19/2019)  Procedures- -Had PEG on 05/15/2019,  Intubated 05/15/2019 Extubated 05/18/2019  Disposition Plan  : TBD  Antibiotic therapy Vancomycin 11/18>>11/18 Cefepime 11/18 >> 11/25 Flagyl 11/18>>11/25 Macrobid 11/20 >> 11/27  Consults  : Pulmonologist Dr. Luan Pulling  DVT Prophylaxis  : Eliquis-   Lab Results  Component Value Date   PLT 264 05/20/2019    Inpatient Medications  Scheduled Meds:  apixaban  5 mg Per Tube BID   atorvastatin  10 mg Per Tube Daily   bisacodyl  10 mg Rectal QHS   chlorhexidine gluconate (MEDLINE KIT)  15 mL Mouth Rinse BID   Chlorhexidine Gluconate Cloth  6 each Topical Daily   [START ON 05/22/2019] feeding supplement (VITAL HIGH PROTEIN)  1,000 mL Per Tube Q24H   free water  200 mL Per Tube Q4H   insulin aspart  0-6 Units Subcutaneous Q4H   insulin glargine  12 Units Subcutaneous Daily   ipratropium-albuterol  3 mL Nebulization TID   labetalol  100 mg Per Tube BID   methimazole  5 mg Per Tube Daily   multivitamin with minerals  1 tablet Per Tube Daily   pantoprazole sodium  20 mg Per Tube BID   polyethylene glycol  17 g Per Tube Daily   polyvinyl alcohol  1 drop Right Eye TID   scopolamine  1 patch Transdermal Q72H   senna-docusate  2 tablet Per Tube QHS   sodium chloride flush  3 mL Intravenous Q12H   Continuous Infusions:  sodium chloride     PRN Meds:.sodium chloride, acetaminophen **OR** acetaminophen, albuterol, dextrose, ondansetron **OR** ondansetron (ZOFRAN) IV, sennosides, sodium chloride flush, traZODone   Anti-infectives (From admission, onward)   Start     Dose/Rate Route Frequency  Ordered Stop   05/15/19 0600  ciprofloxacin (CIPRO) IVPB 400 mg     400 mg 200 mL/hr over 60 Minutes Intravenous On call to O.R. 05/14/19 1951 05/15/19 0804   05/11/19 1600  vancomycin (VANCOCIN) 1,500 mg in sodium chloride 0.9 % 500 mL IVPB  Status:  Discontinued     1,500 mg 250 mL/hr over 120 Minutes Intravenous Every 48 hours 05/09/19 1704 05/10/19 0809   05/11/19 1130  nitrofurantoin (macrocrystal-monohydrate) (MACROBID) capsule 100 mg     100 mg Oral Every 12 hours 05/11/19 1125 05/18/19 0959   05/10/19 1400  ceFEPIme (MAXIPIME) 2 g in sodium chloride 0.9 % 100 mL IVPB  Status:  Discontinued     2 g 200 mL/hr over 30 Minutes Intravenous Every 24 hours 05/09/19 1704 05/10/19 0813   05/10/19 1000  ceFEPIme (MAXIPIME) 2 g in sodium chloride 0.9 % 100 mL IVPB  Status:  Discontinued     2 g 200 mL/hr over 30 Minutes Intravenous Every 12 hours 05/10/19 0813 05/16/19 0813   05/09/19 1900  metroNIDAZOLE (FLAGYL) IVPB 500 mg  Status:  Discontinued     500 mg 100 mL/hr over 60 Minutes Intravenous Every 8 hours 05/09/19 1842 05/16/19 0813   05/09/19 1400  vancomycin (VANCOCIN) IVPB 1000 mg/200 mL premix     1,000 mg 200 mL/hr over 60 Minutes Intravenous Every 1 hr x 2 05/09/19 1302 05/09/19 1643   05/09/19 1300  vancomycin (VANCOCIN) IVPB 1000 mg/200 mL premix  Status:  Discontinued     1,000 mg 200 mL/hr over 60 Minutes Intravenous  Once 05/09/19 1256 05/09/19 1302   05/09/19 1300  ceFEPIme (MAXIPIME) 2 g in sodium chloride 0.9 % 100 mL IVPB     2 g 200 mL/hr over 30 Minutes Intravenous  Once 05/09/19 1256 05/09/19 1349        Objective:   Vitals:   05/21/19 1139 05/21/19 1200 05/21/19 1300 05/21/19 1418  BP:  134/82 136/84   Pulse: 77 78 78   Resp: (!) 21 17 (!) 24   Temp: 98.4 F (36.9 C)     TempSrc: Oral     SpO2: 96% 95% 96% 98%  Weight:      Height:        Wt Readings from Last 3 Encounters:  05/21/19 105.3 kg  05/13/19 106.1 kg  05/08/19 106.4 kg      Intake/Output Summary (Last 24 hours) at 05/21/2019 1610 Last data filed at 05/21/2019 1312 Gross per 24 hour  Intake 5860 ml  Output 800 ml  Net 5060 ml   Physical Exam General exam: Afebrile, no chest pain, no nausea, no vomiting.  Reports no abdominal pain.  So far tolerating PEG tube feedings.  Continue to have significant trouble with secretions clearance and experiencing very tenuous respiratory status.  Patient continued to require deep nasotracheal suctioning intermittently. Respiratory system: Diffuse bilaterally rhonchi it; congested sounding in his upper airways and abundant secretions appreciated.  No using accessory muscles.  Good oxygen saturation on 3 L nasal cannula supplementation. Cardiovascular system:RRR. No murmurs, rubs, gallops. Gastrointestinal system: Abdomen is nondistended, soft and nontender. No organomegaly or masses felt. Normal bowel sounds heard.  PEG tube in  place. Central nervous system: No new focal neurologic deficits appreciated.  Patient with residual right hemiparesis and dysarthria from previous stroke (unchanged). Extremities: No cyanosis or clubbing. Skin: No petechiae. Psychiatry: Judgement and insight appear normal. Mood & affect appropriate.     Data Review:   Micro Results No results found for this or any previous visit (from the past 240 hour(s)). Radiology Reports Ct Abdomen Pelvis Wo Contrast  Result Date: 05/09/2019 CLINICAL DATA:  Abdominal distension. Possible volvulus. EXAM: CT ABDOMEN AND PELVIS WITHOUT CONTRAST TECHNIQUE: Multidetector CT imaging of the abdomen and pelvis was performed following the standard protocol without IV contrast. COMPARISON:  11/03/2016 FINDINGS: Lower chest: Motion artifact through the lung bases with consolidative opacities in the lower lobes and to a lesser extent posterior lingula and right middle lobe. No pleural effusion. Coronary atherosclerosis. Hepatobiliary: 2 cm cyst inferiorly in the right  hepatic lobe, mildly enlarged from 2018. Contracted gallbladder. No biliary dilatation. Pancreas: Unremarkable. Spleen: Unremarkable. Adrenals/Urinary Tract: Unremarkable adrenal glands. Unchanged small calcifications in both renal hila which may represent vascular calcifications or non-obstructing calculi. No hydroureteronephrosis. Unremarkable bladder. Stomach/Bowel: The stomach is collapsed. There is no small bowel dilatation. There is a moderately large amount of stool in the distal sigmoid colon and rectum. The more proximal sigmoid colon is redundant and moderately distended by gas. An ordinary amount of stool is present in the more proximal colon, which is nondilated. Left-sided colonic diverticulosis is noted without evidence of diverticulitis. The appendix is unremarkable. Vascular/Lymphatic: Aortic atherosclerosis without aneurysm. No enlarged lymph nodes. Reproductive: Unremarkable prostate. Other: No ascites or pneumoperitoneum. Fat containing paraumbilical hernia. Musculoskeletal: No acute osseous abnormality or suspicious osseous lesion. Mild lumbar levoscoliosis. Advanced lumbar spondylosis with interbody and facet ankylosis at L4-5. IMPRESSION: 1. Moderately large amount of stool in the distal sigmoid colon and rectum. Moderate gaseous distension of the more proximal sigmoid colon without evidence of volvulus or bowel obstruction. 2. Bibasilar lung consolidation concerning for pneumonia. 3. Aortic Atherosclerosis (ICD10-I70.0). Electronically Signed   By: Logan Bores M.D.   On: 05/09/2019 15:45   Dg Chest Port 1 View  Result Date: 05/19/2019 CLINICAL DATA:  Shortness of breath EXAM: PORTABLE CHEST 1 VIEW COMPARISON:  May 17, 2019 FINDINGS: Endotracheal tube has been removed. No pneumothorax. There is been partial but incomplete clearing of patchy infiltrate from the bases. There is residual consolidation in a portion of the left base with patchy opacity in each lower lung region remaining.  There are small pleural effusions. Heart is upper normal in size with pulmonary vascularity normal. No adenopathy. No bone lesions. IMPRESSION: Partial but incomplete clearing of opacity from the basis. Patchy airspace opacity remains in the lower lung regions with consolidation in a portion of the left base. There are small pleural effusions bilaterally. Upper lung regions appear clear. Heart upper normal in size. No adenopathy demonstrable by radiography. Electronically Signed   By: Lowella Grip III M.D.   On: 05/19/2019 08:34   Dg Chest Port 1 View  Result Date: 05/17/2019 CLINICAL DATA:  Respiratory distress, intubated EXAM: PORTABLE CHEST 1 VIEW COMPARISON:  Radiograph 05/16/2019 FINDINGS: Endotracheal tube remains in the mid trachea, 3.5 cm from the carina. Gradient density is obscuring both lung bases likely reflecting pleural fluid. Some persistent airspace opacities noted throughout both lungs as well. No pneumothorax. Cardiomediastinal contours are similar to prior including a calcified, tortuous aorta. No acute osseous or soft tissue abnormality. Degenerative changes are present in the imaged spine and shoulders. IMPRESSION: 1. Endotracheal  tube remains in the mid trachea, 3.5 cm from the carina. 2. Bilateral pleural effusions with associated airspace opacities similar to comparison. 3. No pneumothorax or pneumomediastinum. Electronically Signed   By: Lovena Le M.D.   On: 05/17/2019 05:14   Dg Chest Port 1 View  Result Date: 05/16/2019 CLINICAL DATA:  Status post intubation. EXAM: PORTABLE CHEST 1 VIEW COMPARISON:  Single-view of the chest 05/15/2019. FINDINGS: New endotracheal tube is in place with the tip in good position at the level of the clavicular heads. Right worse than left airspace disease has progressed since yesterday's examination. Heart size is upper normal. There are likely small bilateral pleural effusions. No pneumothorax. IMPRESSION: ETT in good position. Worsened  bilateral airspace disease small pleural effusions. Electronically Signed   By: Inge Rise M.D.   On: 05/16/2019 11:57   Dg Chest Port 1 View  Result Date: 05/15/2019 CLINICAL DATA:  Dyspnea and sepsis EXAM: PORTABLE CHEST 1 VIEW COMPARISON:  Two days ago FINDINGS: Cardiomegaly and aortic tortuosity. Low volume chest with hazy density at the left more than right base. No pneumothorax or air bronchogram. Artifact from hardware. IMPRESSION: Low volume chest with hazy opacity at the bases correlating with atelectasis on most recent abdominal CT. Cardiomegaly. Electronically Signed   By: Monte Fantasia M.D.   On: 05/15/2019 07:28   Dg Chest Port 1 View  Result Date: 05/13/2019 CLINICAL DATA:  Sepsis. EXAM: PORTABLE CHEST 1 VIEW COMPARISON:  05/09/2019 FINDINGS: 0528 hours. Lordotic position. Low lung volumes. The cardio pericardial silhouette is enlarged. There is pulmonary vascular congestion without overt pulmonary edema. Bibasilar collapse/consolidation again noted, left greater than right. IMPRESSION: Cardiomegaly with low lung volumes and bibasilar collapse/consolidation, left greater than right. Electronically Signed   By: Misty Stanley M.D.   On: 05/13/2019 07:13   Dg Chest Port 1 View  Result Date: 05/09/2019 CLINICAL DATA:  Shortness of breath.  Hypoxia. EXAM: PORTABLE CHEST 1 VIEW COMPARISON:  January 16, 2019. FINDINGS: Stable cardiomediastinal silhouette. No pneumothorax is noted. Hypoinflation of the lungs is noted. Mild bibasilar atelectasis or infiltrates are noted. Bony thorax is unremarkable. IMPRESSION: Mild bibasilar subsegmental atelectasis or infiltrates are noted. Electronically Signed   By: Marijo Conception M.D.   On: 05/09/2019 13:26    CBC Recent Labs  Lab 05/15/19 0549 05/16/19 0423 05/18/19 0606 05/20/19 0432  WBC 8.2 8.0 7.8 9.5  HGB 12.9* 12.5* 13.3 13.5  HCT 42.2 41.0 43.2 44.0  PLT 165 177 227 264  MCV 100.2* 99.5 99.1 98.9  MCH 30.6 30.3 30.5 30.3  MCHC  30.6 30.5 30.8 30.7  RDW 13.3 13.2 13.8 13.4  LYMPHSABS 1.7  --   --   --   MONOABS 0.5  --   --   --   EOSABS 0.2  --   --   --   BASOSABS 0.0  --   --   --     Chemistries  Recent Labs  Lab 05/16/19 0423 05/16/19 1033 05/16/19 1707 05/17/19 0451 05/17/19 1707 05/18/19 0606 05/18/19 1324 05/20/19 0432 05/21/19 0433  NA 145  --   --   --   --  139 139 138 135  K 3.2*  --   --   --   --  3.2* 4.1 3.7 3.8  CL 110  --   --   --   --  102 100 97* 95*  CO2 27  --   --   --   --  29  30 29 31   GLUCOSE 155*  --   --   --   --  246* 267* 190* 199*  BUN 14  --   --   --   --  28* 30* 25* 29*  CREATININE 1.42*  --   --   --   --  1.21 1.29* 1.10 1.10  CALCIUM 7.6*  --   --   --   --  7.7* 7.9* 8.3* 8.2*  MG  --  1.9 2.0 2.0 1.9  --   --   --   --   AST  --   --   --   --   --   --   --  51*  --   ALT  --   --   --   --   --   --   --  43  --   ALKPHOS  --   --   --   --   --   --   --  51  --   BILITOT  --   --   --   --   --   --   --  0.2*  --      Lab Results  Component Value Date   HGBA1C 11.3 (H) 05/09/2019   ------------------------------------------------------------------------------------------------------------------    Component Value Date/Time   BNP 18.0 02/21/2017 0730    Barton Dubois M.D on 05/21/2019 at 4:10 PM  Go to [email or web address] - for contact info  [facility] - Office  [phone]

## 2019-05-21 NOTE — Progress Notes (Signed)
Subjective: He has been able to be extubated but is still having a lot of trouble clearing his secretions.  He is requiring nasotracheal suction with production of a large amount of secretions.    Objective: Vital signs in last 24 hours: Temp:  [98.2 F (36.8 C)-99 F (37.2 C)] 98.3 F (36.8 C) (11/30 0400) Pulse Rate:  [77-83] 82 (11/30 0600) Resp:  [17-23] 19 (11/30 0600) BP: (129-158)/(82-111) 147/93 (11/30 0600) SpO2:  [96 %-100 %] 96 % (11/30 0600) Weight:  [105.3 kg] 105.3 kg (11/30 0500) Weight change: -0.7 kg Last BM Date: 05/19/19  Intake/Output from previous day: 11/29 0701 - 11/30 0700 In: 385 [NG/GT:385] Out: 1500 [Urine:1500]  PHYSICAL EXAM General appearance: Sleepy Resp: rhonchi bilaterally Cardio: regular rate and rhythm, S1, S2 normal, no murmur, click, rub or gallop GI: soft, non-tender; bowel sounds normal; no masses,  no organomegaly Extremities: extremities normal, atraumatic, no cyanosis or edema  Lab Results:  Results for orders placed or performed during the hospital encounter of 05/09/19 (from the past 48 hour(s))  Glucose, capillary     Status: Abnormal   Collection Time: 05/19/19  7:49 AM  Result Value Ref Range   Glucose-Capillary 194 (H) 70 - 99 mg/dL  Glucose, capillary     Status: Abnormal   Collection Time: 05/19/19 10:53 AM  Result Value Ref Range   Glucose-Capillary 225 (H) 70 - 99 mg/dL  Glucose, capillary     Status: Abnormal   Collection Time: 05/19/19  3:45 PM  Result Value Ref Range   Glucose-Capillary 224 (H) 70 - 99 mg/dL  Glucose, capillary     Status: Abnormal   Collection Time: 05/19/19  7:45 PM  Result Value Ref Range   Glucose-Capillary 218 (H) 70 - 99 mg/dL  Glucose, capillary     Status: Abnormal   Collection Time: 05/19/19 10:51 PM  Result Value Ref Range   Glucose-Capillary 209 (H) 70 - 99 mg/dL  Glucose, capillary     Status: Abnormal   Collection Time: 05/20/19  4:06 AM  Result Value Ref Range    Glucose-Capillary 164 (H) 70 - 99 mg/dL  CBC in AM     Status: None   Collection Time: 05/20/19  4:32 AM  Result Value Ref Range   WBC 9.5 4.0 - 10.5 K/uL   RBC 4.45 4.22 - 5.81 MIL/uL   Hemoglobin 13.5 13.0 - 17.0 g/dL   HCT 44.0 39.0 - 52.0 %   MCV 98.9 80.0 - 100.0 fL   MCH 30.3 26.0 - 34.0 pg   MCHC 30.7 30.0 - 36.0 g/dL   RDW 13.4 11.5 - 15.5 %   Platelets 264 150 - 400 K/uL   nRBC 0.0 0.0 - 0.2 %    Comment: Performed at [facility], [address], [place], [place] [address]  Comprehensive metabolic panel     Status: Abnormal   Collection Time: 05/20/19  4:32 AM  Result Value Ref Range   Sodium 138 135 - 145 mmol/L   Potassium 3.7 3.5 - 5.1 mmol/L   Chloride 97 (L) 98 - 111 mmol/L   CO2 29 22 - 32 mmol/L   Glucose, Bld 190 (H) 70 - 99 mg/dL   BUN 25 (H) 8 - 23 mg/dL   Creatinine, Ser 1.10 0.61 - 1.24 mg/dL   Calcium 8.3 (L) 8.9 - 10.3 mg/dL   Total Protein 7.5 6.5 - 8.1 g/dL   Albumin 2.4 (L) 3.5 - 5.0 g/dL   AST 51 (H) 15 -  41 U/L   ALT 43 0 - 44 U/L   Alkaline Phosphatase 51 38 - 126 U/L   Total Bilirubin 0.2 (L) 0.3 - 1.2 mg/dL   GFR calc non Af Amer >60 >60 mL/min   GFR calc Af Amer >60 >60 mL/min   Anion gap 12 5 - 15    Comment: Performed at [facility], [address], [place], [place] [address]  Glucose, capillary     Status: Abnormal   Collection Time: 05/20/19  8:09 AM  Result Value Ref Range   Glucose-Capillary 180 (H) 70 - 99 mg/dL  Glucose, capillary     Status: Abnormal   Collection Time: 05/20/19 11:13 AM  Result Value Ref Range   Glucose-Capillary 219 (H) 70 - 99 mg/dL  Glucose, capillary     Status: Abnormal   Collection Time: 05/20/19  4:53 PM  Result Value Ref Range   Glucose-Capillary 188 (H) 70 - 99 mg/dL  Glucose, capillary     Status: Abnormal   Collection Time: 05/20/19  7:32 PM  Result Value Ref Range   Glucose-Capillary 192 (H) 70 - 99 mg/dL  Glucose, capillary     Status: Abnormal   Collection Time: 05/20/19 11:55 PM  Result  Value Ref Range   Glucose-Capillary 178 (H) 70 - 99 mg/dL  Glucose, capillary     Status: Abnormal   Collection Time: 05/21/19  4:21 AM  Result Value Ref Range   Glucose-Capillary 205 (H) 70 - 99 mg/dL  Basic metabolic panel     Status: Abnormal   Collection Time: 05/21/19  4:33 AM  Result Value Ref Range   Sodium 135 135 - 145 mmol/L   Potassium 3.8 3.5 - 5.1 mmol/L   Chloride 95 (L) 98 - 111 mmol/L   CO2 31 22 - 32 mmol/L   Glucose, Bld 199 (H) 70 - 99 mg/dL   BUN 29 (H) 8 - 23 mg/dL   Creatinine, Ser 1.10 0.61 - 1.24 mg/dL   Calcium 8.2 (L) 8.9 - 10.3 mg/dL   GFR calc non Af Amer >60 >60 mL/min   GFR calc Af Amer >60 >60 mL/min   Anion gap 9 5 - 15    Comment: Performed at [facility], [address] [place], [place] [address]    ABGS Recent Labs    05/18/19 1000  PHART 7.413  PO2ART 64.4*  HCO3 31.1*   CULTURES No results found for this or any previous visit (from the past 240 hour(s)). Studies/Results: Dg Chest Port 1 View  Result Date: 05/19/2019 CLINICAL DATA:  Shortness of breath EXAM: PORTABLE CHEST 1 VIEW COMPARISON:  May 17, 2019 FINDINGS: Endotracheal tube has been removed. No pneumothorax. There is been partial but incomplete clearing of patchy infiltrate from the bases. There is residual consolidation in a portion of the left base with patchy opacity in each lower lung region remaining. There are small pleural effusions. Heart is upper normal in size with pulmonary vascularity normal. No adenopathy. No bone lesions. IMPRESSION: Partial but incomplete clearing of opacity from the basis. Patchy airspace opacity remains in the lower lung regions with consolidation in a portion of the left base. There are small pleural effusions bilaterally. Upper lung regions appear clear. Heart upper normal in size. No adenopathy demonstrable by radiography. Electronically Signed   By: Lowella Grip III M.D.   On: 05/19/2019 08:34    Medications:  Prior to Admission:   Medications Prior to Admission  Medication Sig Dispense Refill Last Dose  .  acetaminophen (TYLENOL) 325 MG tablet Take 650 mg by mouth every 6 (six) hours as needed for moderate pain.      Marland Kitchen apixaban (ELIQUIS) 5 MG TABS tablet Take 5 mg by mouth 2 (two) times daily.   05/09/2019 at 0900  . atorvastatin (LIPITOR) 10 MG tablet Take 10 mg by mouth daily.   05/09/2019 at 0900  . azithromycin (ZITHROMAX) 500 MG tablet Take 500 mg by mouth daily.   05/09/2019 at 0900  . budesonide-formoterol (SYMBICORT) 80-4.5 MCG/ACT inhaler Inhale 2 puffs into the lungs 2 (two) times daily.   05/09/2019 at 0900  . carboxymethylcellulose (REFRESH PLUS) 0.5 % SOLN Place 1 drop into the right eye 3 (three) times daily.    05/09/2019 at 0900  . erythromycin ophthalmic ointment Place into the left eye. appy 1 ribbon ophthalmic (eye) 3 times a day   05/09/2019 at 0900  . hydrochlorothiazide (HYDRODIURIL) 12.5 MG tablet Take 12.5 mg by mouth daily.   05/09/2019 at 0900  . ipratropium-albuterol (DUONEB) 0.5-2.5 (3) MG/3ML SOLN Take 3 mLs by nebulization every 6 (six) hours as needed.   05/09/2019 at 0900  . labetalol (NORMODYNE) 200 MG tablet Take 200 mg by mouth daily. For HTN   05/09/2019 at 0900  . loratadine (CLARITIN) 10 MG tablet Take 10 mg by mouth daily.   05/09/2019 at 0900  . methimazole (TAPAZOLE) 5 MG tablet Take 5 mg by mouth daily.   05/09/2019 at 0900  . sennosides-docusate sodium (SENOKOT-S) 8.6-50 MG tablet Take 2 tablets by mouth 2 (two) times daily. For constipation   05/08/2019 at Unknown time  . umeclidinium bromide (INCRUSE ELLIPTA) 62.5 MCG/INH AEPB Inhale 1 puff into the lungs daily.   05/08/2019 at Unknown time  . valsartan (DIOVAN) 80 MG tablet Take 80 mg by mouth daily.   05/08/2019 at Unknown time   Scheduled: . apixaban  5 mg Per Tube BID  . atorvastatin  10 mg Per Tube Daily  . bisacodyl  10 mg Rectal QHS  . chlorhexidine gluconate (MEDLINE KIT)  15 mL Mouth Rinse BID  . Chlorhexidine  Gluconate Cloth  6 each Topical Daily  . feeding supplement (PRO-STAT SUGAR FREE 64)  60 mL Per Tube BID  . feeding supplement (VITAL HIGH PROTEIN)  1,000 mL Per Tube Q24H  . free water  200 mL Per Tube Q4H  . insulin aspart  0-6 Units Subcutaneous Q4H  . insulin glargine  12 Units Subcutaneous Daily  . ipratropium-albuterol  3 mL Nebulization TID  . labetalol  100 mg Per Tube BID  . methimazole  5 mg Per Tube Daily  . multivitamin with minerals  1 tablet Per Tube Daily  . pantoprazole sodium  20 mg Per Tube BID  . polyethylene glycol  17 g Per Tube Daily  . polyvinyl alcohol  1 drop Right Eye TID  . scopolamine  1 patch Transdermal Q72H  . senna-docusate  2 tablet Per Tube QHS  . sodium chloride flush  3 mL Intravenous Q12H   Continuous: . sodium chloride     EUM:PNTIRW chloride, acetaminophen **OR** acetaminophen, albuterol, dextrose, ondansetron **OR** ondansetron (ZOFRAN) IV, sennosides, sodium chloride flush, traZODone  Assesment: He was admitted with sepsis related to pneumonia.  The pneumonia is felt to be aspiration.  He has dysphagia following a stroke and now has a PEG tube.  Post PEG tube placement he was left intubated but was able to be extubated about 72 hours later.  However he still has a lot  of trouble with secretions.  Chest x-ray still shows infiltrates bilaterally.  He has COPD at baseline and is being treated  He has long-term anticoagulation due to history of DVT pulmonary embolism and strokes  He has acute hypoxic respiratory failure and remains on oxygen with good oxygenation at this point   He was septic on admission and has resolved sepsis pathophysiology  He was markedly hyponatremic on admission and that is better.  This is felt to be due to dehydration free water deficit and probably acute kidney injury.  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  He remains high risk of needing to be reintubated because of difficulty clearing his secretions    LOS: 12 days   Alonza Bogus 05/21/2019, 7:33 AM

## 2019-05-22 DIAGNOSIS — Z515 Encounter for palliative care: Secondary | ICD-10-CM

## 2019-05-22 DIAGNOSIS — R0689 Other abnormalities of breathing: Secondary | ICD-10-CM

## 2019-05-22 DIAGNOSIS — K117 Disturbances of salivary secretion: Secondary | ICD-10-CM

## 2019-05-22 DIAGNOSIS — R0600 Dyspnea, unspecified: Secondary | ICD-10-CM

## 2019-05-22 LAB — GLUCOSE, CAPILLARY
Glucose-Capillary: 190 mg/dL — ABNORMAL HIGH (ref 70–99)
Glucose-Capillary: 210 mg/dL — ABNORMAL HIGH (ref 70–99)
Glucose-Capillary: 211 mg/dL — ABNORMAL HIGH (ref 70–99)
Glucose-Capillary: 224 mg/dL — ABNORMAL HIGH (ref 70–99)
Glucose-Capillary: 229 mg/dL — ABNORMAL HIGH (ref 70–99)
Glucose-Capillary: 253 mg/dL — ABNORMAL HIGH (ref 70–99)

## 2019-05-22 NOTE — Progress Notes (Signed)
Patient Demographics:    [name] [name], is a 73 y.o. male, DOB - Apr 24, 1946, JAS:[id number]  Admit date - 05/09/2019   Admitting Physician Courage Denton Brick, MD  Outpatient Primary MD for the patient is [name], MD  LOS - 55  Chief Complaint  Patient presents with   Respiratory Distress      Subjective:    [name] [name] continue to have significant renal respiratory status, using 2-3 L nasal cannula supplementation.  Afebrile no complaint of chest pain.  Still having significant trouble clearing his secretions and requiring deep nasotracheal suctioning.  High risk for intubation.  Patient will most likely continue to be high risk for aspiration.   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   COPD (chronic obstructive pulmonary disease) ([place])   Hyperthyroidism   Dysphagia due to old cerebrovascular accident-on honey thickened liquids   Bil PNA (pneumonia)--suspect aspiration related   Hypernatremia   Acute hypoxic respiratory failure due to bilateral pneumonia   Diabetes mellitus with hyperglycemia--new diagnosis   AKI (acute kidney injury) ([place])   Goals of care, counseling/discussion   Palliative care encounter   Enterococcus faecalis UTI infection   Endotracheally intubated--- on ventilator   PEG (percutaneous endoscopic gastrostomy) status -Placed 05/15/2019  Brief Summary:- 73 y.o. 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, as well as history of hyperthyroidism, HTN and recurrent episodes  of sigmoid volvulus admitted on 05/09/2019 from [facility] with hypoxia requiring nonrebreather bag, secondary to bilateral pneumonia probably aspiration related, also found with severe hyperglycemia and severe hypernatremia without frank DKA -Had PEG on 05/15/2019,  Intubated 05/15/2019 Extubated 05/18/2019   A/p 1)-Sepsis secondary to Bilateral Pneumonia--suspect aspiration related in  the patient with dysphagia--- has penicillin allergy -Pharmacy consult appreciated -Completed cefepime and Flagyl  -Patient met sepsis criteria on admission with transient hypotension requiring aggressive IV fluid boluses, tachypnea, hypoxia, leukocytosis and, reports of fever -COVID-19 test Neg -Sepsis pathophysiology has resolved  2)Bilateral Pneumonia--- suspect recurrent aspiration related,  completed antibiotics as above #1 -Continue n.p.o. status -Continue bronchodilators and mucolytics -Started on flutter valve, scopolamine patch for further assistance with secretion management and chest physiotherapy  3)Acute Hypoxic Respiratory Failure--- secondary to #2 above, treat as above #1 and #2, -Chest x-ray from 05/17/2019 noted with bilateral pleural effusions, received IV Lasix,  -Intubated 05/15/2019 -Extubated 05/18/2019 -Chest x-ray 11/28 shows persistent small bilateral pleural effusions as well as opacity at the lower lung regions with consolidation in a portion of the left base. -Patient has completed treatment with antibiotics for aspiration related pneumonia. -He is continued on high flow nasal cannula and is requiring intermittent deep suction. -Started on scopolamine patch to assist with secretion control, chest PT on flutter valve.  4)New onset DM2 severe hyperglycemia without frank DKA--  --A1c 11.3 -Glycemic control continues to improve, -Restart Lantus at 12 units daily -Continue sliding scale insulin and further adjust  hypoglycemic regimen as needed based on CBGs fluctuation.      5)HyperNatremia--sodium is down to 138 from 161,   -it was due to dehydration and free water deficit,  -suspected significant free water deficit PTA in the patient with dysphagia who is on honey thickened liquids. -Patient also presented with sepsis and hyperglycemia. -Continue to maintain appropriate hydration and continue free water through PEG tube.  6)AKI----acute kidney injury due to severe dehydration in the setting of sepsis with transient hypotension as well as significant dehydration and volume depletion. -creatinine on admission=2.85  ,   baseline creatinine = 1.0 ( 6 months PTA ), -creatinine is down to 1.10 with hydration -Continue to Hold Diovan and HCTZ  7)History of prior stroke/DVT/PE- -continue eliquis   8)Dysphagia--- patient with prior stroke with right hemiparesis and Dysphagia, PTA was on honey thickened liquids.   -Patient and his POA/brother [name] [name] have  decided to pursue  PEG/G-tube--so he can have free water administered and nutrition. -hopefully preventing future AKI/dehydration/hypernatremia -Continue vital HP PEG tube feeding currently at 35 mL an hour via PEG tube, continue water flushes  9)Social/Ethics- Patient is a poor historian due to dysarthria from prior stroke  -Discussed with patient's brother/POA -Mr [name] [name] at [phone]  -Plan of care and advanced directive discussed, patient is a full code  10)H/o Prior CVA with residual right sided hemiparesis and Dysphagia/dysarthria -continue Eliquis and Lipitor for secondary prevention.  11)Hyperthyroidism:  -Continue methimazole and labetalol  12)History of recurrent sigmoid volvulus and chronic constipation- -CT abdomen and pelvis without evidence of volvulus or SBO at this time, laxatives as ordered -reports having BM and denies abd pain.  13) Enterococcus faecalis UTI- -resistant to quinolones, patient is allergy to penicillins, he completed a course of Macrobid -no dysuria     Disposition/Need for in-Hospital Stay- patient unable to be discharged at this time due to tenuous respiratory status.  Needs continued monitoring since he is at high risk for decompensation and reintubation. Will observe for another 24 hours in stepdown..  Code Status : Full Code  Family Communication:  Discussed with patient's brother/POA -Mr Case Vassell at [phone]--OLMBEMLJ times during this hospitalization (last update 05/21/2019)  Procedures- -Had PEG on 05/15/2019,  Intubated 05/15/2019 Extubated 05/18/2019  Disposition Plan  : TBD  Antibiotic therapy Vancomycin 11/18>>11/18 Cefepime 11/18 >> 11/25 Flagyl 11/18>>11/25 Macrobid 11/20 >> 11/27  Consults  : Pulmonologist Dr. Luan Pulling  DVT Prophylaxis  : Eliquis-   Lab Results  Component Value Date   PLT 264 05/20/2019    Inpatient Medications  Scheduled Meds:  apixaban  5 mg Per Tube BID   atorvastatin  10 mg Per Tube Daily   bisacodyl  10 mg Rectal QHS   chlorhexidine gluconate (MEDLINE KIT)  15 mL Mouth Rinse BID   Chlorhexidine Gluconate Cloth  6 each Topical Daily   feeding supplement (VITAL HIGH PROTEIN)  1,000 mL Per Tube Q24H   free water  200 mL Per Tube Q4H   insulin aspart  0-6 Units Subcutaneous Q4H   insulin glargine  12 Units Subcutaneous Daily   ipratropium-albuterol  3 mL Nebulization TID   labetalol  100 mg Per Tube BID   methimazole  5 mg Per Tube Daily   multivitamin with minerals  1 tablet Per Tube Daily   pantoprazole sodium  20 mg Per Tube BID   polyethylene glycol  17 g Per Tube Daily   polyvinyl alcohol  1 drop Right Eye TID   scopolamine  1 patch  Transdermal Q72H   senna-docusate  2 tablet Per Tube QHS   sodium chloride flush  3 mL Intravenous Q12H   Continuous Infusions:  sodium chloride     PRN Meds:.sodium chloride, acetaminophen **OR** acetaminophen, albuterol, dextrose, ondansetron **OR** ondansetron (ZOFRAN) IV, sennosides, sodium chloride flush,  traZODone   Anti-infectives (From admission, onward)   Start     Dose/Rate Route Frequency Ordered Stop   05/15/19 0600  ciprofloxacin (CIPRO) IVPB 400 mg     400 mg 200 mL/hr over 60 Minutes Intravenous On call to O.R. 05/14/19 1951 05/15/19 0804   05/11/19 1600  vancomycin (VANCOCIN) 1,500 mg in sodium chloride 0.9 % 500 mL IVPB  Status:  Discontinued     1,500 mg 250 mL/hr over 120 Minutes Intravenous Every 48 hours 05/09/19 1704 05/10/19 0809   05/11/19 1130  nitrofurantoin (macrocrystal-monohydrate) (MACROBID) capsule 100 mg     100 mg Oral Every 12 hours 05/11/19 1125 05/18/19 0959   05/10/19 1400  ceFEPIme (MAXIPIME) 2 g in sodium chloride 0.9 % 100 mL IVPB  Status:  Discontinued     2 g 200 mL/hr over 30 Minutes Intravenous Every 24 hours 05/09/19 1704 05/10/19 0813   05/10/19 1000  ceFEPIme (MAXIPIME) 2 g in sodium chloride 0.9 % 100 mL IVPB  Status:  Discontinued     2 g 200 mL/hr over 30 Minutes Intravenous Every 12 hours 05/10/19 0813 05/16/19 0813   05/09/19 1900  metroNIDAZOLE (FLAGYL) IVPB 500 mg  Status:  Discontinued     500 mg 100 mL/hr over 60 Minutes Intravenous Every 8 hours 05/09/19 1842 05/16/19 0813   05/09/19 1400  vancomycin (VANCOCIN) IVPB 1000 mg/200 mL premix     1,000 mg 200 mL/hr over 60 Minutes Intravenous Every 1 hr x 2 05/09/19 1302 05/09/19 1643   05/09/19 1300  vancomycin (VANCOCIN) IVPB 1000 mg/200 mL premix  Status:  Discontinued     1,000 mg 200 mL/hr over 60 Minutes Intravenous  Once 05/09/19 1256 05/09/19 1302   05/09/19 1300  ceFEPIme (MAXIPIME) 2 g in sodium chloride 0.9 % 100 mL IVPB     2 g 200 mL/hr over 30 Minutes Intravenous  Once 05/09/19 1256 05/09/19 1349        Objective:   Vitals:   05/22/19 0800 05/22/19 0829 05/22/19 0900 05/22/19 1000  BP: 140/83  (!) 144/88 140/87  Pulse:   60 79  Resp: (!) 26  17 (!) 22  Temp:      TempSrc:      SpO2:  93% (!) 88% 96%  Weight:      Height:        Wt Readings from Last 3  Encounters:  05/22/19 109.1 kg  05/13/19 106.1 kg  05/08/19 106.4 kg     Intake/Output Summary (Last 24 hours) at 05/22/2019 1057 Last data filed at 05/22/2019 1007 Gross per 24 hour  Intake 7651 ml  Output 1800 ml  Net 5851 ml   Physical Exam General exam: Alert, awake, oriented x 3; currently afebrile, denies chest pain, no nausea, no vomiting.  So far tolerating PEG tube feedings.  Continues to require intermittent deep nasotracheal suctioning and has been experiencing desaturation around that.  Diffuse rhonchi and difficulty clearing secretions appreciated. Respiratory system: Positive rhonchi bilaterally; no crackles, no wheezing.  Fair air movement.  Patient is down to 2 L nasal cannula supplementation. Cardiovascular system:RRR. No murmurs, rubs, gallops. Gastrointestinal system: Abdomen is nondistended, soft and nontender. No organomegaly or masses felt.  Normal bowel sounds heard. Central nervous system: No new focal deficit.  Patient with residual dysarthria and right hemiparesis from previous stroke. Extremities: No cyanosis or clubbing. Skin: No petechiae. Psychiatry: Judgement and insight appear normal. Mood & affect appropriate.     Data Review:   Micro Results No results found for this or any previous visit (from the past 240 hour(s)). Radiology Reports Ct Abdomen Pelvis Wo Contrast  Result Date: 05/09/2019 CLINICAL DATA:  Abdominal distension. Possible volvulus. EXAM: CT ABDOMEN AND PELVIS WITHOUT CONTRAST TECHNIQUE: Multidetector CT imaging of the abdomen and pelvis was performed following the standard protocol without IV contrast. COMPARISON:  11/03/2016 FINDINGS: Lower chest: Motion artifact through the lung bases with consolidative opacities in the lower lobes and to a lesser extent posterior lingula and right middle lobe. No pleural effusion. Coronary atherosclerosis. Hepatobiliary: 2 cm cyst inferiorly in the right hepatic lobe, mildly enlarged from 2018.  Contracted gallbladder. No biliary dilatation. Pancreas: Unremarkable. Spleen: Unremarkable. Adrenals/Urinary Tract: Unremarkable adrenal glands. Unchanged small calcifications in both renal hila which may represent vascular calcifications or non-obstructing calculi. No hydroureteronephrosis. Unremarkable bladder. Stomach/Bowel: The stomach is collapsed. There is no small bowel dilatation. There is a moderately large amount of stool in the distal sigmoid colon and rectum. The more proximal sigmoid colon is redundant and moderately distended by gas. An ordinary amount of stool is present in the more proximal colon, which is nondilated. Left-sided colonic diverticulosis is noted without evidence of diverticulitis. The appendix is unremarkable. Vascular/Lymphatic: Aortic atherosclerosis without aneurysm. No enlarged lymph nodes. Reproductive: Unremarkable prostate. Other: No ascites or pneumoperitoneum. Fat containing paraumbilical hernia. Musculoskeletal: No acute osseous abnormality or suspicious osseous lesion. Mild lumbar levoscoliosis. Advanced lumbar spondylosis with interbody and facet ankylosis at L4-5. IMPRESSION: 1. Moderately large amount of stool in the distal sigmoid colon and rectum. Moderate gaseous distension of the more proximal sigmoid colon without evidence of volvulus or bowel obstruction. 2. Bibasilar lung consolidation concerning for pneumonia. 3. Aortic Atherosclerosis (ICD10-I70.0). Electronically Signed   By: Logan Bores M.D.   On: 05/09/2019 15:45   Dg Chest Port 1 View  Result Date: 05/19/2019 CLINICAL DATA:  Shortness of breath EXAM: PORTABLE CHEST 1 VIEW COMPARISON:  May 17, 2019 FINDINGS: Endotracheal tube has been removed. No pneumothorax. There is been partial but incomplete clearing of patchy infiltrate from the bases. There is residual consolidation in a portion of the left base with patchy opacity in each lower lung region remaining. There are small pleural effusions. Heart  is upper normal in size with pulmonary vascularity normal. No adenopathy. No bone lesions. IMPRESSION: Partial but incomplete clearing of opacity from the basis. Patchy airspace opacity remains in the lower lung regions with consolidation in a portion of the left base. There are small pleural effusions bilaterally. Upper lung regions appear clear. Heart upper normal in size. No adenopathy demonstrable by radiography. Electronically Signed   By: Lowella Grip III M.D.   On: 05/19/2019 08:34   Dg Chest Port 1 View  Result Date: 05/17/2019 CLINICAL DATA:  Respiratory distress, intubated EXAM: PORTABLE CHEST 1 VIEW COMPARISON:  Radiograph 05/16/2019 FINDINGS: Endotracheal tube remains in the mid trachea, 3.5 cm from the carina. Gradient density is obscuring both lung bases likely reflecting pleural fluid. Some persistent airspace opacities noted throughout both lungs as well. No pneumothorax. Cardiomediastinal contours are similar to prior including a calcified, tortuous aorta. No acute osseous or soft tissue abnormality. Degenerative changes are present in the imaged spine and shoulders. IMPRESSION: 1. Endotracheal  tube remains in the mid trachea, 3.5 cm from the carina. 2. Bilateral pleural effusions with associated airspace opacities similar to comparison. 3. No pneumothorax or pneumomediastinum. Electronically Signed   By: Lovena Le M.D.   On: 05/17/2019 05:14   Dg Chest Port 1 View  Result Date: 05/16/2019 CLINICAL DATA:  Status post intubation. EXAM: PORTABLE CHEST 1 VIEW COMPARISON:  Single-view of the chest 05/15/2019. FINDINGS: New endotracheal tube is in place with the tip in good position at the level of the clavicular heads. Right worse than left airspace disease has progressed since yesterday's examination. Heart size is upper normal. There are likely small bilateral pleural effusions. No pneumothorax. IMPRESSION: ETT in good position. Worsened bilateral airspace disease small pleural  effusions. Electronically Signed   By: Inge Rise M.D.   On: 05/16/2019 11:57   Dg Chest Port 1 View  Result Date: 05/15/2019 CLINICAL DATA:  Dyspnea and sepsis EXAM: PORTABLE CHEST 1 VIEW COMPARISON:  Two days ago FINDINGS: Cardiomegaly and aortic tortuosity. Low volume chest with hazy density at the left more than right base. No pneumothorax or air bronchogram. Artifact from hardware. IMPRESSION: Low volume chest with hazy opacity at the bases correlating with atelectasis on most recent abdominal CT. Cardiomegaly. Electronically Signed   By: Monte Fantasia M.D.   On: 05/15/2019 07:28   Dg Chest Port 1 View  Result Date: 05/13/2019 CLINICAL DATA:  Sepsis. EXAM: PORTABLE CHEST 1 VIEW COMPARISON:  05/09/2019 FINDINGS: 0528 hours. Lordotic position. Low lung volumes. The cardio pericardial silhouette is enlarged. There is pulmonary vascular congestion without overt pulmonary edema. Bibasilar collapse/consolidation again noted, left greater than right. IMPRESSION: Cardiomegaly with low lung volumes and bibasilar collapse/consolidation, left greater than right. Electronically Signed   By: Misty Stanley M.D.   On: 05/13/2019 07:13   Dg Chest Port 1 View  Result Date: 05/09/2019 CLINICAL DATA:  Shortness of breath.  Hypoxia. EXAM: PORTABLE CHEST 1 VIEW COMPARISON:  January 16, 2019. FINDINGS: Stable cardiomediastinal silhouette. No pneumothorax is noted. Hypoinflation of the lungs is noted. Mild bibasilar atelectasis or infiltrates are noted. Bony thorax is unremarkable. IMPRESSION: Mild bibasilar subsegmental atelectasis or infiltrates are noted. Electronically Signed   By: Marijo Conception M.D.   On: 05/09/2019 13:26    CBC Recent Labs  Lab 05/16/19 0423 05/18/19 0606 05/20/19 0432  WBC 8.0 7.8 9.5  HGB 12.5* 13.3 13.5  HCT 41.0 43.2 44.0  PLT 177 227 264  MCV 99.5 99.1 98.9  MCH 30.3 30.5 30.3  MCHC 30.5 30.8 30.7  RDW 13.2 13.8 13.4    Chemistries  Recent Labs  Lab 05/16/19 0423  05/16/19 1033 05/16/19 1707 05/17/19 0451 05/17/19 1707 05/18/19 0606 05/18/19 1324 05/20/19 0432 05/21/19 0433  NA 145  --   --   --   --  139 139 138 135  K 3.2*  --   --   --   --  3.2* 4.1 3.7 3.8  CL 110  --   --   --   --  102 100 97* 95*  CO2 27  --   --   --   --  29 30 29 31   GLUCOSE 155*  --   --   --   --  246* 267* 190* 199*  BUN 14  --   --   --   --  28* 30* 25* 29*  CREATININE 1.42*  --   --   --   --  1.21 1.29*  1.10 1.10  CALCIUM 7.6*  --   --   --   --  7.7* 7.9* 8.3* 8.2*  MG  --  1.9 2.0 2.0 1.9  --   --   --   --   AST  --   --   --   --   --   --   --  51*  --   ALT  --   --   --   --   --   --   --  43  --   ALKPHOS  --   --   --   --   --   --   --  51  --   BILITOT  --   --   --   --   --   --   --  0.2*  --      Lab Results  Component Value Date   HGBA1C 11.3 (H) 05/09/2019   ------------------------------------------------------------------------------------------------------------------    Component Value Date/Time   BNP 18.0 02/21/2017 0730    Barton Dubois M.D on 05/22/2019 at 10:57 AM  Go to [email or web address] - for contact info  [facility] - Office  [phone]

## 2019-05-22 NOTE — Progress Notes (Addendum)
Daily Progress Note   Patient Name: [name] [name]       Date: 05/22/2019 DOB: 20-Jul-1945  Age: 73 y.o. MRN#: [id number] Attending Physician: Barton Dubois, MD Primary Care Physician: [name], MD Admit Date: 05/09/2019  Reason for Consultation/Follow-up: Establishing goals of care  Subjective: Patient awake, alert, difficult to understand with dysarthria. Audible secretions. Denies pain or shortness of breath. Asking for water. Explained NPO status and that nursing staff can perform oral care with swabs but he cannot have water. Explained that he is receiving nutrition and water via PEG. No family at bedside.  GOC:   F/u with brother/POA, [name] via telephone. Introduced myself with palliative medicine and f/u from colleague Vinie Sill who spoke with him last week.  Discussed course of hospitalization including diagnoses, interventions, plan of care. Discussed copious secretions that place him at high risk for aspiration and risk for decline leading to respiratory failure and need for intubation again. [name] confirms that he has spoken with a few doctors, and Dr. Dyann Kief yesterday and his understanding of Ion's condition. "Touch and go." [name] speaks firmly on the fact that his brother has always desired aggressive medical interventions. "Live as long as he can" and "he wants to live!" [name] shares that he was told 17 years ago (after his brother's stroke) that he was very sick and not going to make it. [name] states "A lot of people would have given up. He pulled through."   [name] shares that his brother is very stubborn and the family remains hopeful he will get better. "Praying he gets better" and "do all you can for him."   Encouraged [name] to consider what quality of life looks like for  his brother---and if his condition worsening requiring recurrent intubation, whether trach route would really be what Carmel would want. Frankly and compassionately explained that it is only a matter of time before aspiration leads to respiratory distress again. I did encourage [name] to continue discussions with their sister (lives in [place]) who also helps with medical decision making.   Answered questions. PMT contact information given. Spiritual support provided.    Length of Stay: 13  Current Medications: Scheduled Meds:  . apixaban  5 mg Per Tube BID  . atorvastatin  10 mg Per Tube Daily  . bisacodyl  10 mg  Rectal QHS  . chlorhexidine gluconate (MEDLINE KIT)  15 mL Mouth Rinse BID  . Chlorhexidine Gluconate Cloth  6 each Topical Daily  . feeding supplement (VITAL HIGH PROTEIN)  1,000 mL Per Tube Q24H  . free water  200 mL Per Tube Q4H  . insulin aspart  0-6 Units Subcutaneous Q4H  . insulin glargine  12 Units Subcutaneous Daily  . ipratropium-albuterol  3 mL Nebulization TID  . labetalol  100 mg Per Tube BID  . methimazole  5 mg Per Tube Daily  . multivitamin with minerals  1 tablet Per Tube Daily  . pantoprazole sodium  20 mg Per Tube BID  . polyethylene glycol  17 g Per Tube Daily  . polyvinyl alcohol  1 drop Right Eye TID  . scopolamine  1 patch Transdermal Q72H  . senna-docusate  2 tablet Per Tube QHS  . sodium chloride flush  3 mL Intravenous Q12H    Continuous Infusions: . sodium chloride      PRN Meds: sodium chloride, acetaminophen **OR** acetaminophen, albuterol, dextrose, ondansetron **OR** ondansetron (ZOFRAN) IV, sennosides, sodium chloride flush, traZODone  Physical Exam Vitals signs and nursing note reviewed.  Constitutional:      General: He is awake.     Appearance: He is ill-appearing.  HENT:     Head: Normocephalic and atraumatic.  Cardiovascular:     Rate and Rhythm: Normal rate.     Heart sounds: Normal heart sounds.  Pulmonary:     Effort: No  tachypnea or respiratory distress.     Breath sounds: Rhonchi present.     Comments: Audible secretions Abdominal:     Tenderness: There is no abdominal tenderness.     Comments: PEG  Skin:    General: Skin is warm and dry.  Neurological:     Mental Status: He is alert.     Comments: Difficult to communicate with dysarthria. Awake and alert.            Vital Signs: BP 140/87   Pulse 79   Temp 98.3 F (36.8 C)   Resp (!) 22   Ht 5' 8"  (1.727 m)   Wt 109.1 kg   SpO2 96%   BMI 36.57 kg/m  SpO2: SpO2: 96 % O2 Device: O2 Device: Nasal Cannula O2 Flow Rate: O2 Flow Rate (L/min): 5 L/min  Intake/output summary:   Intake/Output Summary (Last 24 hours) at 05/22/2019 1037 Last data filed at 05/22/2019 1007 Gross per 24 hour  Intake 7654 ml  Output 1800 ml  Net 5854 ml   LBM: Last BM Date: 05/19/19 Baseline Weight: Weight: 106 kg Most recent weight: Weight: 109.1 kg       Palliative Assessment/Data: PPS 30%    Flowsheet Rows     Most Recent Value  Intake Tab  Referral Department  Hospitalist  Unit at Time of Referral  ER  Palliative Care Primary Diagnosis  Neurology  Date Notified  05/10/19  Palliative Care Type  New Palliative care  Reason for referral  Clarify Goals of Care  Date of Admission  05/09/19  Date first seen by Palliative Care  05/10/19  # of days Palliative referral response time  0 Day(s)  # of days IP prior to Palliative referral  1  Clinical Assessment  Psychosocial & Spiritual Assessment  Palliative Care Outcomes      Patient Active Problem List   Diagnosis Date Noted  . Endotracheally intubated--- on ventilator 05/15/2019  . PEG (percutaneous endoscopic gastrostomy) status -Placed 05/15/2019  05/15/2019  . Enterococcus faecalis UTI infection 05/11/2019  . Goals of care, counseling/discussion   . Palliative care encounter   . Bil PNA (pneumonia)--suspect aspiration related 05/09/2019  . Hypernatremia 05/09/2019  . Sepsis secondary to  pneumonia 05/09/2019  . Acute hypoxic respiratory failure due to bilateral pneumonia 05/09/2019  . Diabetes mellitus with hyperglycemia--new diagnosis 05/09/2019  . AKI (acute kidney injury) ([place]) 05/09/2019  . HCAP (healthcare-associated pneumonia) 05/04/2019  . Elevated PSA, between 10 and less than 20 ng/ml 02/08/2019  . Aspiration pneumonia of right lower lobe ([place]) 01/30/2019  . 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 Dysphagia due to old cerebrovascular accident-on honey thickened liquids 08/25/2018  . Dyslipidemia 08/25/2018  . Hyperthyroidism 04/05/2017  . Hypertension 10/27/2016  . 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 COPD (chronic obstructive pulmonary disease) ([place]) 10/19/2016  . 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 Long term current use of anticoagulant therapy-on Eliquis due to history of DVT/PE and strokes 10/21/2012  . DVT (deep venous thrombosis) ([place]) 09/26/2012  . Chronic pulmonary embolism ([place]) 09/26/2012  . Aspiration pneumonia of left lower lobe ([place]) 09/26/2012  . Constipation 09/26/2012    Palliative Care Assessment & Plan   Patient Profile: 73 y.o. male  with past medical history of stroke with residual dysphagia and right sided hemiparesis, on Eliquis, fatty liver, HTN, h/o PE, aspiration pneumonia, sigmoid volvulus admitted on 05/09/2019 with respiratory distress with bilateral pneumonia. PEG placed 11/24. Patient required intubation/mechanical ventilation from 11/24-11/27. Extubated on nasal cannula 2-3 L but tenuous respiratory status with copious secretions requiring nasotracheal suctioning, chest PT, and scopolamine patch. High risk for decompensation and re-intubation.   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   PMT f/u with brother regarding goals, plan of care, and ongoing copious secretions placing Damarion at high risk for decline and need for re-intubation. [name] shares his understanding of severity of his brother's condition but continues to desire FULL code/FULL scope treatment. The patient has always told [name] in the past, that he desired aggressive medical interventions and to "live as long as he can."   Continue current plan of care and medical management.   May benefit from outpatient palliative referral at SNF. Pt very high risk for decompensation.  Goals of Care and Additional Recommendations:  Limitations on Scope of Treatment: Full Scope Treatment  Code Status: FULL   Code Status Orders  (From admission, onward)         Start     Ordered   05/09/19 2107  Full code  Continuous     05/09/19 2106        Code Status History    Date Active Date Inactive Code Status Order ID Comments User Context   11/03/2016 1621 11/05/2016 2155 Full Code [id number]  Kathie Dike, MD Inpatient   10/27/2016 1116 10/31/2016 1423 Full Code [id number]  Orvan Falconer, MD Inpatient   10/21/2016 1851 10/23/2016 2111 Full Code [id number]  Eber Jones, MD Inpatient   Advance Care Planning Activity       Prognosis:  Guarded-poor: High risk for decompensation  Discharge Planning:  To Be Determined  Care plan was discussed with patient, Dr. Dyann Kief,  brother/POA ([name])  Thank you for allowing the Palliative Medicine Team to assist in the care of this patient.   Time In: 1000- 1215- Time Out: 1020 1230 Total Time 35 Prolonged Time Billed no      Greater than 50%  of this time was spent counseling and coordinating care related to the above assessment and plan.  Ihor Dow, DNP, FNP-C Palliative  Medicine Team  Phone: [phone] Fax: [phone]  Please contact Palliative Medicine Team phone at [phone] for questions and concerns.

## 2019-05-22 NOTE — Progress Notes (Signed)
Subjective: He remains off the ventilator.  He is still requiring suctioning.  He is apparently tolerating his feedings okay.  He is now down to 2 L with good oxygenation still with tachypnea  Objective: Vital signs in last 24 hours: Temp:  [97.7 F (36.5 C)-98.4 F (36.9 C)] 98.3 F (36.8 C) (12/01 0400) Pulse Rate:  [76-86] 86 (12/01 0600) Resp:  [17-24] 23 (12/01 0600) BP: (118-152)/(74-96) 140/83 (12/01 0600) SpO2:  [95 %-100 %] 97 % (12/01 0600) Weight:  [109.1 kg] 109.1 kg (12/01 0400) Weight change: 3.8 kg Last BM Date: 05/19/19  Intake/Output from previous day: 11/30 0701 - 12/01 0700 In: 7651 [I.V.:3; NG/GT:7448] Out: 1800 [Urine:600; Drains:1200]  PHYSICAL EXAM General appearance: Poorly responsive.  I think he is probably at baseline mental status Resp: rhonchi bilaterally Cardio: regular rate and rhythm, S1, S2 normal, no murmur, click, rub or gallop GI: soft, non-tender; bowel sounds normal; no masses,  no organomegaly Extremities: extremities normal, atraumatic, no cyanosis or edema  Lab Results:  Results for orders placed or performed during the hospital encounter of 05/09/19 (from the past 48 hour(s))  Glucose, capillary     Status: Abnormal   Collection Time: 05/20/19  8:09 AM  Result Value Ref Range   Glucose-Capillary 180 (H) 70 - 99 mg/dL  Glucose, capillary     Status: Abnormal   Collection Time: 05/20/19 11:13 AM  Result Value Ref Range   Glucose-Capillary 219 (H) 70 - 99 mg/dL  Glucose, capillary     Status: Abnormal   Collection Time: 05/20/19  4:53 PM  Result Value Ref Range   Glucose-Capillary 188 (H) 70 - 99 mg/dL  Glucose, capillary     Status: Abnormal   Collection Time: 05/20/19  7:32 PM  Result Value Ref Range   Glucose-Capillary 192 (H) 70 - 99 mg/dL  Glucose, capillary     Status: Abnormal   Collection Time: 05/20/19 11:55 PM  Result Value Ref Range   Glucose-Capillary 178 (H) 70 - 99 mg/dL  Glucose, capillary     Status: Abnormal    Collection Time: 05/21/19  4:21 AM  Result Value Ref Range   Glucose-Capillary 205 (H) 70 - 99 mg/dL  Basic metabolic panel     Status: Abnormal   Collection Time: 05/21/19  4:33 AM  Result Value Ref Range   Sodium 135 135 - 145 mmol/L   Potassium 3.8 3.5 - 5.1 mmol/L   Chloride 95 (L) 98 - 111 mmol/L   CO2 31 22 - 32 mmol/L   Glucose, Bld 199 (H) 70 - 99 mg/dL   BUN 29 (H) 8 - 23 mg/dL   Creatinine, Ser 1.10 0.61 - 1.24 mg/dL   Calcium 8.2 (L) 8.9 - 10.3 mg/dL   GFR calc non Af Amer >60 >60 mL/min   GFR calc Af Amer >60 >60 mL/min   Anion gap 9 5 - 15    Comment: Performed at [facility], [address] [place], [place] [address]  Glucose, capillary     Status: Abnormal   Collection Time: 05/21/19  7:37 AM  Result Value Ref Range   Glucose-Capillary 179 (H) 70 - 99 mg/dL  Glucose, capillary     Status: Abnormal   Collection Time: 05/21/19 11:38 AM  Result Value Ref Range   Glucose-Capillary 226 (H) 70 - 99 mg/dL  Glucose, capillary     Status: Abnormal   Collection Time: 05/21/19  4:27 PM  Result Value Ref Range   Glucose-Capillary 173 (H)  70 - 99 mg/dL  Glucose, capillary     Status: Abnormal   Collection Time: 05/21/19  8:06 PM  Result Value Ref Range   Glucose-Capillary 197 (H) 70 - 99 mg/dL  Glucose, capillary     Status: Abnormal   Collection Time: 05/21/19 11:49 PM  Result Value Ref Range   Glucose-Capillary 190 (H) 70 - 99 mg/dL   Comment 1 Notify RN    Comment 2 Document in Chart   Glucose, capillary     Status: Abnormal   Collection Time: 05/22/19  3:47 AM  Result Value Ref Range   Glucose-Capillary 210 (H) 70 - 99 mg/dL   Comment 1 Notify RN    Comment 2 Document in Chart   Glucose, capillary     Status: Abnormal   Collection Time: 05/22/19  7:26 AM  Result Value Ref Range   Glucose-Capillary 229 (H) 70 - 99 mg/dL    ABGS No results for input(s): PHART, PO2ART, TCO2, HCO3 in the last 72 hours.  Invalid input(s): PCO2 CULTURES No results found  for this or any previous visit (from the past 240 hour(s)). Studies/Results: No results found.  Medications:  Prior to Admission:  Medications Prior to Admission  Medication Sig Dispense Refill Last Dose  . acetaminophen (TYLENOL) 325 MG tablet Take 650 mg by mouth every 6 (six) hours as needed for moderate pain.      Marland Kitchen apixaban (ELIQUIS) 5 MG TABS tablet Take 5 mg by mouth 2 (two) times daily.   05/09/2019 at 0900  . atorvastatin (LIPITOR) 10 MG tablet Take 10 mg by mouth daily.   05/09/2019 at 0900  . azithromycin (ZITHROMAX) 500 MG tablet Take 500 mg by mouth daily.   05/09/2019 at 0900  . budesonide-formoterol (SYMBICORT) 80-4.5 MCG/ACT inhaler Inhale 2 puffs into the lungs 2 (two) times daily.   05/09/2019 at 0900  . carboxymethylcellulose (REFRESH PLUS) 0.5 % SOLN Place 1 drop into the right eye 3 (three) times daily.    05/09/2019 at 0900  . erythromycin ophthalmic ointment Place into the left eye. appy 1 ribbon ophthalmic (eye) 3 times a day   05/09/2019 at 0900  . hydrochlorothiazide (HYDRODIURIL) 12.5 MG tablet Take 12.5 mg by mouth daily.   05/09/2019 at 0900  . ipratropium-albuterol (DUONEB) 0.5-2.5 (3) MG/3ML SOLN Take 3 mLs by nebulization every 6 (six) hours as needed.   05/09/2019 at 0900  . labetalol (NORMODYNE) 200 MG tablet Take 200 mg by mouth daily. For HTN   05/09/2019 at 0900  . loratadine (CLARITIN) 10 MG tablet Take 10 mg by mouth daily.   05/09/2019 at 0900  . methimazole (TAPAZOLE) 5 MG tablet Take 5 mg by mouth daily.   05/09/2019 at 0900  . sennosides-docusate sodium (SENOKOT-S) 8.6-50 MG tablet Take 2 tablets by mouth 2 (two) times daily. For constipation   05/08/2019 at Unknown time  . umeclidinium bromide (INCRUSE ELLIPTA) 62.5 MCG/INH AEPB Inhale 1 puff into the lungs daily.   05/08/2019 at Unknown time  . valsartan (DIOVAN) 80 MG tablet Take 80 mg by mouth daily.   05/08/2019 at Unknown time   Scheduled: . apixaban  5 mg Per Tube BID  . atorvastatin  10 mg  Per Tube Daily  . bisacodyl  10 mg Rectal QHS  . chlorhexidine gluconate (MEDLINE KIT)  15 mL Mouth Rinse BID  . Chlorhexidine Gluconate Cloth  6 each Topical Daily  . feeding supplement (VITAL HIGH PROTEIN)  1,000 mL Per Tube Q24H  .  free water  200 mL Per Tube Q4H  . insulin aspart  0-6 Units Subcutaneous Q4H  . insulin glargine  12 Units Subcutaneous Daily  . ipratropium-albuterol  3 mL Nebulization TID  . labetalol  100 mg Per Tube BID  . methimazole  5 mg Per Tube Daily  . multivitamin with minerals  1 tablet Per Tube Daily  . pantoprazole sodium  20 mg Per Tube BID  . polyethylene glycol  17 g Per Tube Daily  . polyvinyl alcohol  1 drop Right Eye TID  . scopolamine  1 patch Transdermal Q72H  . senna-docusate  2 tablet Per Tube QHS  . sodium chloride flush  3 mL Intravenous Q12H   Continuous: . sodium chloride     VLR:[id number] chloride, acetaminophen **OR** acetaminophen, albuterol, dextrose, ondansetron **OR** ondansetron (ZOFRAN) IV, sennosides, sodium chloride flush, traZODone  Assesment: He was admitted with sepsis due to pneumonia likely from aspiration.  He has had stroke and is known to have right hemiparesis and dysphagia from that.  He required intubation after placement of PEG tube and he remained on the ventilator for about 48 hours and was able to be extubated.  Respiratory status is still fairly tenuous and he still has trouble clearing secretions.  He is requiring suctioning  He has hypoxic respiratory failure is better and is down to 2 L with oxygen saturation in the 98-100 range  He has COPD at baseline and that is being treated  He was substantially dehydrated and had elevated sodium level which is better and he also had significantly elevated blood sugar with an A1c of 11.3  He had acute kidney injury which has improved  He was significantly dehydrated as mentioned and has improved which has improved his blood sugar electrolyte abnormalities and kidney  function Principal Problem:   Sepsis secondary to pneumonia Active Problems:   Long term current use of anticoagulant therapy-on Eliquis due to history of DVT/PE and strokes   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   COPD (chronic obstructive pulmonary disease) ([place])   Hyperthyroidism   Dysphagia due to old cerebrovascular accident-on honey thickened liquids   Bil PNA (pneumonia)--suspect aspiration related   Hypernatremia   Acute hypoxic respiratory failure due to bilateral pneumonia   Diabetes mellitus with hyperglycemia--new diagnosis   AKI (acute kidney injury) ([place])   Goals of care, counseling/discussion   Palliative care encounter   Enterococcus faecalis UTI infection   Endotracheally intubated--- on ventilator   PEG (percutaneous endoscopic gastrostomy) status -Placed 05/15/2019    Plan: Continue current treatments.  Not much else to add.  He will likely continue to aspirate but should be able to improve his nutritional status at least and perhaps improve his hydration with the PEG tube    LOS: 13 days   Alonza Bogus 05/22/2019, 7:30 AM

## 2019-05-23 ENCOUNTER — Inpatient Hospital Stay
Admission: RE | Admit: 2019-05-23 | Discharge: 2020-05-09 | Disposition: A | Discharge status: Nursing Home (Includes ICF) | Payer: Medicare Other | Source: Ambulatory Visit | Attending: Internal Medicine | Admitting: Internal Medicine

## 2019-05-23 DIAGNOSIS — M2042 Other hammer toe(s) (acquired), left foot: Secondary | ICD-10-CM | POA: Diagnosis not present

## 2019-05-23 DIAGNOSIS — J410 Simple chronic bronchitis: Secondary | ICD-10-CM | POA: Diagnosis not present

## 2019-05-23 DIAGNOSIS — Z741 Need for assistance with personal care: Secondary | ICD-10-CM | POA: Diagnosis not present

## 2019-05-23 DIAGNOSIS — E1165 Type 2 diabetes mellitus with hyperglycemia: Secondary | ICD-10-CM | POA: Diagnosis not present

## 2019-05-23 DIAGNOSIS — Z7901 Long term (current) use of anticoagulants: Secondary | ICD-10-CM | POA: Diagnosis not present

## 2019-05-23 DIAGNOSIS — Z431 Encounter for attention to gastrostomy: Secondary | ICD-10-CM | POA: Diagnosis not present

## 2019-05-23 DIAGNOSIS — R1312 Dysphagia, oropharyngeal phase: Secondary | ICD-10-CM | POA: Diagnosis not present

## 2019-05-23 DIAGNOSIS — K117 Disturbances of salivary secretion: Secondary | ICD-10-CM | POA: Diagnosis not present

## 2019-05-23 DIAGNOSIS — Z794 Long term (current) use of insulin: Secondary | ICD-10-CM | POA: Diagnosis not present

## 2019-05-23 DIAGNOSIS — Z23 Encounter for immunization: Secondary | ICD-10-CM | POA: Diagnosis not present

## 2019-05-23 DIAGNOSIS — M2041 Other hammer toe(s) (acquired), right foot: Secondary | ICD-10-CM | POA: Diagnosis not present

## 2019-05-23 DIAGNOSIS — E870 Hyperosmolality and hypernatremia: Secondary | ICD-10-CM | POA: Diagnosis not present

## 2019-05-23 DIAGNOSIS — Z993 Dependence on wheelchair: Secondary | ICD-10-CM | POA: Diagnosis not present

## 2019-05-23 DIAGNOSIS — Z0000 Encounter for general adult medical examination without abnormal findings: Secondary | ICD-10-CM | POA: Diagnosis not present

## 2019-05-23 DIAGNOSIS — I69391 Dysphagia following cerebral infarction: Secondary | ICD-10-CM | POA: Diagnosis not present

## 2019-05-23 DIAGNOSIS — E1159 Type 2 diabetes mellitus with other circulatory complications: Secondary | ICD-10-CM | POA: Diagnosis not present

## 2019-05-23 DIAGNOSIS — J9601 Acute respiratory failure with hypoxia: Secondary | ICD-10-CM | POA: Diagnosis not present

## 2019-05-23 DIAGNOSIS — H1031 Unspecified acute conjunctivitis, right eye: Secondary | ICD-10-CM | POA: Diagnosis not present

## 2019-05-23 DIAGNOSIS — K567 Ileus, unspecified: Secondary | ICD-10-CM | POA: Diagnosis not present

## 2019-05-23 DIAGNOSIS — I739 Peripheral vascular disease, unspecified: Secondary | ICD-10-CM | POA: Diagnosis not present

## 2019-05-23 DIAGNOSIS — M6281 Muscle weakness (generalized): Secondary | ICD-10-CM | POA: Diagnosis not present

## 2019-05-23 DIAGNOSIS — I679 Cerebrovascular disease, unspecified: Secondary | ICD-10-CM | POA: Diagnosis not present

## 2019-05-23 DIAGNOSIS — Z931 Gastrostomy status: Secondary | ICD-10-CM | POA: Diagnosis not present

## 2019-05-23 DIAGNOSIS — B351 Tinea unguium: Secondary | ICD-10-CM | POA: Diagnosis not present

## 2019-05-23 DIAGNOSIS — I69351 Hemiplegia and hemiparesis following cerebral infarction affecting right dominant side: Secondary | ICD-10-CM | POA: Diagnosis not present

## 2019-05-23 DIAGNOSIS — J690 Pneumonitis due to inhalation of food and vomit: Secondary | ICD-10-CM | POA: Diagnosis not present

## 2019-05-23 DIAGNOSIS — J449 Chronic obstructive pulmonary disease, unspecified: Secondary | ICD-10-CM | POA: Diagnosis not present

## 2019-05-23 DIAGNOSIS — H179 Unspecified corneal scar and opacity: Secondary | ICD-10-CM | POA: Diagnosis not present

## 2019-05-23 DIAGNOSIS — E1169 Type 2 diabetes mellitus with other specified complication: Secondary | ICD-10-CM | POA: Diagnosis not present

## 2019-05-23 DIAGNOSIS — E1149 Type 2 diabetes mellitus with other diabetic neurological complication: Secondary | ICD-10-CM | POA: Diagnosis not present

## 2019-05-23 DIAGNOSIS — B952 Enterococcus as the cause of diseases classified elsewhere: Secondary | ICD-10-CM | POA: Diagnosis not present

## 2019-05-23 DIAGNOSIS — I6389 Other cerebral infarction: Secondary | ICD-10-CM | POA: Diagnosis not present

## 2019-05-23 DIAGNOSIS — E785 Hyperlipidemia, unspecified: Secondary | ICD-10-CM | POA: Diagnosis not present

## 2019-05-23 DIAGNOSIS — I69328 Other speech and language deficits following cerebral infarction: Secondary | ICD-10-CM | POA: Diagnosis not present

## 2019-05-23 DIAGNOSIS — H04123 Dry eye syndrome of bilateral lacrimal glands: Secondary | ICD-10-CM | POA: Diagnosis not present

## 2019-05-23 DIAGNOSIS — Z9981 Dependence on supplemental oxygen: Secondary | ICD-10-CM | POA: Diagnosis not present

## 2019-05-23 DIAGNOSIS — A419 Sepsis, unspecified organism: Secondary | ICD-10-CM | POA: Diagnosis not present

## 2019-05-23 DIAGNOSIS — G8191 Hemiplegia, unspecified affecting right dominant side: Secondary | ICD-10-CM | POA: Diagnosis not present

## 2019-05-23 DIAGNOSIS — Z48815 Encounter for surgical aftercare following surgery on the digestive system: Secondary | ICD-10-CM | POA: Diagnosis not present

## 2019-05-23 DIAGNOSIS — R140 Abdominal distension (gaseous): Secondary | ICD-10-CM | POA: Diagnosis not present

## 2019-05-23 DIAGNOSIS — K562 Volvulus: Secondary | ICD-10-CM | POA: Diagnosis not present

## 2019-05-23 DIAGNOSIS — I2782 Chronic pulmonary embolism: Secondary | ICD-10-CM | POA: Diagnosis not present

## 2019-05-23 DIAGNOSIS — E0590 Thyrotoxicosis, unspecified without thyrotoxic crisis or storm: Secondary | ICD-10-CM | POA: Diagnosis not present

## 2019-05-23 DIAGNOSIS — Z978 Presence of other specified devices: Secondary | ICD-10-CM | POA: Diagnosis not present

## 2019-05-23 DIAGNOSIS — R279 Unspecified lack of coordination: Secondary | ICD-10-CM | POA: Diagnosis not present

## 2019-05-23 DIAGNOSIS — I82502 Chronic embolism and thrombosis of unspecified deep veins of left lower extremity: Secondary | ICD-10-CM | POA: Diagnosis not present

## 2019-05-23 DIAGNOSIS — I10 Essential (primary) hypertension: Secondary | ICD-10-CM | POA: Diagnosis not present

## 2019-05-23 DIAGNOSIS — I639 Cerebral infarction, unspecified: Secondary | ICD-10-CM | POA: Diagnosis not present

## 2019-05-23 DIAGNOSIS — E058 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: Secondary | ICD-10-CM | POA: Diagnosis not present

## 2019-05-23 DIAGNOSIS — K219 Gastro-esophageal reflux disease without esophagitis: Secondary | ICD-10-CM | POA: Diagnosis not present

## 2019-05-23 DIAGNOSIS — J189 Pneumonia, unspecified organism: Secondary | ICD-10-CM | POA: Diagnosis not present

## 2019-05-23 DIAGNOSIS — Z515 Encounter for palliative care: Secondary | ICD-10-CM | POA: Diagnosis not present

## 2019-05-23 DIAGNOSIS — Z1159 Encounter for screening for other viral diseases: Secondary | ICD-10-CM | POA: Diagnosis not present

## 2019-05-23 DIAGNOSIS — N390 Urinary tract infection, site not specified: Secondary | ICD-10-CM | POA: Diagnosis not present

## 2019-05-23 DIAGNOSIS — R6520 Severe sepsis without septic shock: Secondary | ICD-10-CM | POA: Diagnosis not present

## 2019-05-23 LAB — GLUCOSE, CAPILLARY
Glucose-Capillary: 215 mg/dL — ABNORMAL HIGH (ref 70–99)
Glucose-Capillary: 217 mg/dL — ABNORMAL HIGH (ref 70–99)
Glucose-Capillary: 229 mg/dL — ABNORMAL HIGH (ref 70–99)

## 2019-05-23 MED ORDER — SENNOSIDES 8.8 MG/5ML PO SYRP: 5.0000 mL | ORAL_SOLUTION | Freq: Two times a day (BID) | ORAL | 0 refills | Status: DC | PRN

## 2019-05-23 MED ORDER — VITAL HIGH PROTEIN PO LIQD: 1000.0000 mL | ORAL | 0 refills | Status: DC

## 2019-05-23 MED ORDER — ADULT MULTIVITAMIN W/MINERALS CH: 1.0000 | ORAL_TABLET | Freq: Every day | ORAL | 0 refills | Status: DC

## 2019-05-23 MED ORDER — APIXABAN 5 MG PO TABS: 5.0000 mg | ORAL_TABLET | Freq: Two times a day (BID) | ORAL | 0 refills | Status: DC

## 2019-05-23 MED ORDER — BISACODYL 10 MG RE SUPP: 10.0000 mg | Freq: Every day | RECTAL | 0 refills | Status: AC

## 2019-05-23 MED ORDER — INSULIN GLARGINE 100 UNIT/ML ~~LOC~~ SOLN: 12.0000 [IU] | Freq: Every day | SUBCUTANEOUS | 11 refills | Status: DC

## 2019-05-23 MED ORDER — PANTOPRAZOLE SODIUM 40 MG PO PACK: 20.0000 mg | PACK | Freq: Two times a day (BID) | ORAL | 0 refills | Status: DC

## 2019-05-23 MED ORDER — FREE WATER: 200.0000 mL | 0 refills | Status: DC

## 2019-05-23 MED ORDER — SCOPOLAMINE 1 MG/3DAYS TD PT72: 1.0000 | MEDICATED_PATCH | TRANSDERMAL | 12 refills | Status: DC

## 2019-05-23 NOTE — NC FL2 (Signed)
[place] MEDICAID FL2 LEVEL OF CARE SCREENING TOOL     IDENTIFICATION  Patient Name: [name] [name] Birthdate: 02/24/1946 Sex: male Admission Date (Current Location): 05/09/2019  [facility] and [place] Number:  [employer] and Address:  [place] [address] [place]      Provider Number: [id number]  Attending Physician Name and Address:  Rodena Goldmann, DO  Relative Name and Phone Number:       Current Level of Care: Hospital Recommended Level of Care: Branch Prior Approval Number:    Date Approved/Denied:   PASRR Number:    Discharge Plan: SNF    Current Diagnoses: Patient Active Problem List   Diagnosis Date Noted  . Palliative care by specialist   . Dyspnea and respiratory abnormalities   . Increased oropharyngeal secretions   . Endotracheally intubated--- on ventilator 05/15/2019  . PEG (percutaneous endoscopic gastrostomy) status -Placed 05/15/2019 05/15/2019  . Enterococcus faecalis UTI infection 05/11/2019  . Goals of care, counseling/discussion   . Palliative care encounter   . Bil PNA (pneumonia)--suspect aspiration related 05/09/2019  . Hypernatremia 05/09/2019  . Sepsis secondary to pneumonia 05/09/2019  . Acute hypoxic respiratory failure due to bilateral pneumonia 05/09/2019  . Diabetes mellitus with hyperglycemia--new diagnosis 05/09/2019  . AKI (acute kidney injury) ([place]) 05/09/2019  . HCAP (healthcare-associated pneumonia) 05/04/2019  . Elevated PSA, between 10 and less than 20 ng/ml 02/08/2019  . Aspiration pneumonia of right lower lobe ([place]) 01/30/2019  . 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 Dysphagia due to old cerebrovascular accident-on honey thickened liquids 08/25/2018  . Dyslipidemia 08/25/2018  . Hyperthyroidism 04/05/2017  . Hypertension 10/27/2016  . 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 COPD (chronic obstructive pulmonary disease) ([place]) 10/19/2016  . 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 Long term current use of anticoagulant therapy-on Eliquis due to history of DVT/PE and strokes 10/21/2012  . DVT (deep venous thrombosis) ([place]) 09/26/2012  . Chronic pulmonary embolism ([place]) 09/26/2012  . Aspiration pneumonia of left lower lobe ([place]) 09/26/2012  . Constipation 09/26/2012    Orientation RESPIRATION BLADDER Height & Weight     Self  O2(3-4 liters) Incontinent Weight: 103.6 kg Height:  _0  (172.7 cm)  BEHAVIORAL SYMPTOMS/MOOD NEUROLOGICAL BOWEL NUTRITION STATUS      Incontinent Feeding tube  AMBULATORY STATUS COMMUNICATION OF NEEDS Skin   Extensive Assist Verbally Surgical wounds(incision -lt abdomen (PEG))                       Personal Care Assistance Level of Assistance  Bathing, Feeding, Dressing Bathing Assistance: Maximum assistance Feeding assistance: Limited assistance Dressing Assistance: Maximum assistance     Functional Limitations Info  Sight, Hearing, Speech Sight Info: Adequate Hearing Info: Adequate Speech Info: Impaired    SPECIAL CARE FACTORS FREQUENCY                       Contractures      Additional Factors Info  Code Status, Allergies Code Status Info: FULL Allergies Info: PENICILLIN           Current Medications (05/23/2019):  This is the current hospital active medication list Current Facility-Administered Medications  Medication Dose Route Frequency Provider Last Rate Last Dose  . 0.9 %  sodium chloride infusion  250 mL Intravenous PRN  Barton Dubois, MD      . acetaminophen (TYLENOL) tablet 650 mg  650 mg Per Tube Q6H PRN Barton Dubois, MD       Or  . acetaminophen (TYLENOL) suppository 650 mg  650 mg Rectal Q6H PRN Barton Dubois, MD      . albuterol (PROVENTIL) (2.5 MG/3ML) 0.083% nebulizer solution 2.5 mg  2.5 mg  Nebulization Q2H PRN Barton Dubois, MD   2.5 mg at 05/18/19 0023  . apixaban (ELIQUIS) tablet 5 mg  5 mg Per Tube BID Barton Dubois, MD   5 mg at 05/23/19 1610  . atorvastatin (LIPITOR) tablet 10 mg  10 mg Per Tube Daily Barton Dubois, MD   10 mg at 05/23/19 9604  . bisacodyl (DULCOLAX) suppository 10 mg  10 mg Rectal QHS Barton Dubois, MD   10 mg at 05/18/19 2123  . chlorhexidine gluconate (MEDLINE KIT) (PERIDEX) 0.12 % solution 15 mL  15 mL Mouth Rinse BID Barton Dubois, MD   15 mL at 05/23/19 0820  . Chlorhexidine Gluconate Cloth 2 % PADS 6 each  6 each Topical Daily Barton Dubois, MD   6 each at 05/23/19 0820  . dextrose 50 % solution 0-50 mL  0-50 mL Intravenous PRN Barton Dubois, MD      . feeding supplement (VITAL HIGH PROTEIN) liquid 1,000 mL  1,000 mL Per Tube Q24H Barton Dubois, MD   1,000 mL at 05/22/19 1014  . free water 200 mL  200 mL Per Tube Q4H Barton Dubois, MD   200 mL at 05/23/19 0825  . insulin aspart (novoLOG) injection 0-6 Units  0-6 Units Subcutaneous Q4H Barton Dubois, MD   2 Units at 05/23/19 0820  . insulin glargine (LANTUS) injection 12 Units  12 Units Subcutaneous Daily Barton Dubois, MD   12 Units at 05/23/19 [phone]  . ipratropium-albuterol (DUONEB) 0.5-2.5 (3) MG/3ML nebulizer solution 3 mL  3 mL Nebulization TID Barton Dubois, MD   3 mL at 05/23/19 8119  . labetalol (NORMODYNE) tablet 100 mg  100 mg Per Tube BID Barton Dubois, MD   100 mg at 05/23/19 0826  . methimazole (TAPAZOLE) tablet 5 mg  5 mg Per Tube Daily Barton Dubois, MD   5 mg at 05/23/19 1478  . multivitamin with minerals tablet 1 tablet  1 tablet Per Tube Daily Barton Dubois, MD   1 tablet at 05/23/19 [phone]  . ondansetron (ZOFRAN) tablet 4 mg  4 mg Per Tube Q6H PRN Barton Dubois, MD       Or  . ondansetron [facility]) injection 4 mg  4 mg Intravenous Q6H PRN Barton Dubois, MD      . pantoprazole sodium (PROTONIX) 40 mg/20 mL oral suspension 20 mg  20 mg Per Tube BID Barton Dubois, MD   20 mg  at 05/22/19 2113  . polyethylene glycol (MIRALAX / GLYCOLAX) packet 17 g  17 g Per Tube Daily Barton Dubois, MD   17 g at 05/22/19 1007  . polyvinyl alcohol (LIQUIFILM TEARS) 1.4 % ophthalmic solution 1 drop  1 drop Right Eye TID Barton Dubois, MD   1 drop at 05/23/19 0825  . scopolamine (TRANSDERM-SCOP) 1 MG/3DAYS 1.5 mg  1 patch Transdermal Q72H Barton Dubois, MD   1.5 mg at 05/23/19 2130  . senna-docusate (Senokot-S) tablet 2 tablet  2 tablet Per Tube Morrell Riddle, MD   2 tablet at 05/22/19 2113  . sennosides (SENOKOT) 8.8 MG/5ML syrup 5 mL  5 mL Per Tube BID PRN Dyann Kief,  Clifton James, MD   5 mL at 05/20/19 0900  . sodium chloride flush (NS) 0.9 % injection 3 mL  3 mL Intravenous Q12H Barton Dubois, MD   3 mL at 05/23/19 0826  . sodium chloride flush (NS) 0.9 % injection 3 mL  3 mL Intravenous PRN Barton Dubois, MD      . traZODone (DESYREL) tablet 50 mg  50 mg Per Tube QHS PRN Barton Dubois, MD         Discharge Medications: Please see discharge summary for a list of discharge medications.  Relevant Imaging Results:  Relevant Lab Results:   Additional Information    Amir Fick, Chauncey Reading, RN

## 2019-05-23 NOTE — Care Management Important Message (Signed)
Important Message  Patient Details  Name: [name] [name] MRN: [id number] Date of Birth: Jun 13, 1946   Medicare Important Message Given:  Yes     Tommy Medal 05/23/2019, 4:20 PM

## 2019-05-23 NOTE — Progress Notes (Signed)
Subjective: I think he is overall about the same.  He is more awake than I have seen him.  He is talking.  He has secretions on his face which I cleaned  Objective: Vital signs in last 24 hours: Temp:  [97.7 F (36.5 C)-98.4 F (36.9 C)] 98 F (36.7 C) (12/02 0400) Pulse Rate:  [60-79] 72 (12/02 0600) Resp:  [17-26] 19 (12/02 0600) BP: (108-148)/(76-93) 122/84 (12/02 0600) SpO2:  [88 %-96 %] 93 % (12/02 0600) Weight:  [103.6 kg] 103.6 kg (12/02 0300) Weight change: -5.5 kg Last BM Date: 05/22/19  Intake/Output from previous day: 12/01 0701 - 12/02 0700 In: 2223 [I.V.:3; NG/GT:2070] Out: 1000 [Urine:1000]  PHYSICAL EXAM General appearance: alert and Very difficult to understand because of his dysarthria Resp: rhonchi bilaterally Cardio: regular rate and rhythm, S1, S2 normal, no murmur, click, rub or gallop GI: soft, non-tender; bowel sounds normal; no masses,  no organomegaly Extremities: extremities normal, atraumatic, no cyanosis or edema  Lab Results:  Results for orders placed or performed during the hospital encounter of 05/09/19 (from the past 48 hour(s))  Glucose, capillary     Status: Abnormal   Collection Time: 05/21/19  7:37 AM  Result Value Ref Range   Glucose-Capillary 179 (H) 70 - 99 mg/dL  Glucose, capillary     Status: Abnormal   Collection Time: 05/21/19 11:38 AM  Result Value Ref Range   Glucose-Capillary 226 (H) 70 - 99 mg/dL  Glucose, capillary     Status: Abnormal   Collection Time: 05/21/19  4:27 PM  Result Value Ref Range   Glucose-Capillary 173 (H) 70 - 99 mg/dL  Glucose, capillary     Status: Abnormal   Collection Time: 05/21/19  8:06 PM  Result Value Ref Range   Glucose-Capillary 197 (H) 70 - 99 mg/dL  Glucose, capillary     Status: Abnormal   Collection Time: 05/21/19 11:49 PM  Result Value Ref Range   Glucose-Capillary 190 (H) 70 - 99 mg/dL   Comment 1 Notify RN    Comment 2 Document in Chart   Glucose, capillary     Status: Abnormal    Collection Time: 05/22/19  3:47 AM  Result Value Ref Range   Glucose-Capillary 210 (H) 70 - 99 mg/dL   Comment 1 Notify RN    Comment 2 Document in Chart   Glucose, capillary     Status: Abnormal   Collection Time: 05/22/19  7:26 AM  Result Value Ref Range   Glucose-Capillary 229 (H) 70 - 99 mg/dL  Glucose, capillary     Status: Abnormal   Collection Time: 05/22/19 11:14 AM  Result Value Ref Range   Glucose-Capillary 253 (H) 70 - 99 mg/dL  Glucose, capillary     Status: Abnormal   Collection Time: 05/22/19  4:25 PM  Result Value Ref Range   Glucose-Capillary 224 (H) 70 - 99 mg/dL  Glucose, capillary     Status: Abnormal   Collection Time: 05/22/19  8:31 PM  Result Value Ref Range   Glucose-Capillary 190 (H) 70 - 99 mg/dL  Glucose, capillary     Status: Abnormal   Collection Time: 05/22/19 11:38 PM  Result Value Ref Range   Glucose-Capillary 211 (H) 70 - 99 mg/dL  Glucose, capillary     Status: Abnormal   Collection Time: 05/23/19  3:37 AM  Result Value Ref Range   Glucose-Capillary 215 (H) 70 - 99 mg/dL    ABGS No results for input(s): PHART, PO2ART, TCO2, HCO3 in  the last 72 hours.  Invalid input(s): PCO2 CULTURES No results found for this or any previous visit (from the past 240 hour(s)). Studies/Results: No results found.  Medications:  Prior to Admission:  Medications Prior to Admission  Medication Sig Dispense Refill Last Dose  . acetaminophen (TYLENOL) 325 MG tablet Take 650 mg by mouth every 6 (six) hours as needed for moderate pain.      Marland Kitchen apixaban (ELIQUIS) 5 MG TABS tablet Take 5 mg by mouth 2 (two) times daily.   05/09/2019 at 0900  . atorvastatin (LIPITOR) 10 MG tablet Take 10 mg by mouth daily.   05/09/2019 at 0900  . azithromycin (ZITHROMAX) 500 MG tablet Take 500 mg by mouth daily.   05/09/2019 at 0900  . budesonide-formoterol (SYMBICORT) 80-4.5 MCG/ACT inhaler Inhale 2 puffs into the lungs 2 (two) times daily.   05/09/2019 at 0900  .  carboxymethylcellulose (REFRESH PLUS) 0.5 % SOLN Place 1 drop into the right eye 3 (three) times daily.    05/09/2019 at 0900  . erythromycin ophthalmic ointment Place into the left eye. appy 1 ribbon ophthalmic (eye) 3 times a day   05/09/2019 at 0900  . hydrochlorothiazide (HYDRODIURIL) 12.5 MG tablet Take 12.5 mg by mouth daily.   05/09/2019 at 0900  . ipratropium-albuterol (DUONEB) 0.5-2.5 (3) MG/3ML SOLN Take 3 mLs by nebulization every 6 (six) hours as needed.   05/09/2019 at 0900  . labetalol (NORMODYNE) 200 MG tablet Take 200 mg by mouth daily. For HTN   05/09/2019 at 0900  . loratadine (CLARITIN) 10 MG tablet Take 10 mg by mouth daily.   05/09/2019 at 0900  . methimazole (TAPAZOLE) 5 MG tablet Take 5 mg by mouth daily.   05/09/2019 at 0900  . sennosides-docusate sodium (SENOKOT-S) 8.6-50 MG tablet Take 2 tablets by mouth 2 (two) times daily. For constipation   05/08/2019 at Unknown time  . umeclidinium bromide (INCRUSE ELLIPTA) 62.5 MCG/INH AEPB Inhale 1 puff into the lungs daily.   05/08/2019 at Unknown time  . valsartan (DIOVAN) 80 MG tablet Take 80 mg by mouth daily.   05/08/2019 at Unknown time   Scheduled: . apixaban  5 mg Per Tube BID  . atorvastatin  10 mg Per Tube Daily  . bisacodyl  10 mg Rectal QHS  . chlorhexidine gluconate (MEDLINE KIT)  15 mL Mouth Rinse BID  . Chlorhexidine Gluconate Cloth  6 each Topical Daily  . feeding supplement (VITAL HIGH PROTEIN)  1,000 mL Per Tube Q24H  . free water  200 mL Per Tube Q4H  . insulin aspart  0-6 Units Subcutaneous Q4H  . insulin glargine  12 Units Subcutaneous Daily  . ipratropium-albuterol  3 mL Nebulization TID  . labetalol  100 mg Per Tube BID  . methimazole  5 mg Per Tube Daily  . multivitamin with minerals  1 tablet Per Tube Daily  . pantoprazole sodium  20 mg Per Tube BID  . polyethylene glycol  17 g Per Tube Daily  . polyvinyl alcohol  1 drop Right Eye TID  . scopolamine  1 patch Transdermal Q72H  . senna-docusate  2  tablet Per Tube QHS  . sodium chloride flush  3 mL Intravenous Q12H   Continuous: . sodium chloride     SWF:UXNATF chloride, acetaminophen **OR** acetaminophen, albuterol, dextrose, ondansetron **OR** ondansetron (ZOFRAN) IV, sennosides, sodium chloride flush, traZODone  Assesment: He was admitted with sepsis from pneumonia.  The pneumonia is from aspiration.  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  He has a PEG tube now.  He required mechanical ventilation after the PEG tube was placed.  He was able to be extubated .    He still has trouble with secretions.  He has diabetes which is a new diagnosis  He had hypernatremia likely related to his dehydration.  He had acute kidney injury related to dehydration.  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   COPD (chronic obstructive pulmonary disease) ([place])   Hyperthyroidism   Dysphagia due to old cerebrovascular accident-on honey thickened liquids   Bil PNA (pneumonia)--suspect aspiration related   Hypernatremia   Acute hypoxic respiratory failure due to bilateral pneumonia   Diabetes mellitus with hyperglycemia--new diagnosis   AKI (acute kidney injury) ([place])   Goals of care, counseling/discussion   Palliative care encounter   Enterococcus faecalis UTI infection   Endotracheally intubated--- on ventilator   PEG (percutaneous endoscopic gastrostomy) status -Placed 05/15/2019   Palliative care by specialist   Dyspnea and respiratory abnormalities   Increased oropharyngeal secretions    Plan: Continue current treatment .  He is probably approaching maximum hospital benefit.  Discussed with Dr. Manuella Ghazi hospitalist  attending    LOS: 14 days   Alonza Bogus 05/23/2019, 7:24 AM

## 2019-05-23 NOTE — TOC Transition Note (Addendum)
Transition of Care [facility]) - CM/SW Discharge Note   Patient Details  Name: [name] [name] MRN: [id number] Date of Birth: 01-04-1946  [facility] of [facility] CM/SW Contact:  Karalyne Nusser, Chauncey Reading, RN Phone Number: 05/23/2019, 10:22 AM   Clinical Narrative:   Patient to DC back to [facility] today. Clinicals sent. Discussed with  Tami at [facility]. Bedside RN to call report. [facility] staff to transport.       Barriers to Discharge: Barriers Resolved Discharge Placement                Patient to be transferred to facility by: [facility] staff   Patient and family notified of of transfer: 05/23/19  Discharge Plan and Services In-house Referral: Clinical Social Work                      Social Determinants of Health (SDOH) Interventions     Readmission Risk Interventions Readmission Risk Prevention Plan 05/23/2019 05/10/2019  Transportation Screening Complete Complete  PCP or Specialist Appt within 3-5 Days Complete -  HRI or [place] Not Complete Not Complete  HRI or Home Care Consult comments Pt. LTC at [facility] Pt resides in LTC at [place] for [place] Planning/Counseling Complete -  [place] Screening Complete -  Medication Review Press photographer) Complete Complete  Some recent data might be hidden

## 2019-05-23 NOTE — Progress Notes (Signed)
Patient is to be discharged back to [facility] and in stable condition. Patient's IV removed, WNL. Patient's PEG clamped, WNL. Patient report given to accepting RN and facility will transport patient via stretcher at their earliest availability.  Celestia Khat, RN

## 2019-05-23 NOTE — Discharge Summary (Signed)
Physician Discharge Summary  [name] [name] JOA:[id number] DOB: 1945/07/18 DOA: 05/09/2019  PCP: Margit Hanks, MD  Admit date: 05/09/2019  Discharge date: 05/23/2019  Admitted From: SNF  Disposition: To [facility] SNF  Recommendations for Outpatient Follow-up:  1. Follow up with PCP in 1-2 weeks 2. Follow-up with general surgery as indicated for PEG tube malfunction or issues 3. 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  He is completed full treatment with cefepime and Flagyl and Covid testing has returned negative.  He is noted to have new onset type 2 diabetes with A1c 11.3% and has been started on Lantus 12 units daily. He is currently on 2 L nasal cannula with ongoing tube feeds.  He was previously noted to have some hypernatremia and no lab results have been noted in the last 2 days.  We will plan to repeat labs in a.m.  He is apparently awaiting transfer back to [facility] today.  He has been medically optimized and is stable for discharge.  He will need to  remain on continuous PEG tube feedings as prescribed.  Discharge Diagnoses:  Principal Problem:   Sepsis secondary to pneumonia Active Problems:   Long term current use of anticoagulant therapy-on Eliquis due to history of DVT/PE and strokes   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   COPD (chronic obstructive pulmonary disease) (HCC)   Hyperthyroidism   Dysphagia due to old cerebrovascular accident-on honey thickened liquids   Bil PNA (pneumonia)--suspect aspiration related   Hypernatremia   Acute hypoxic respiratory failure due to bilateral pneumonia   Diabetes mellitus with hyperglycemia--new diagnosis   AKI (acute kidney injury) (HCC)   Goals of care, counseling/discussion   Palliative care encounter   Enterococcus faecalis UTI infection   Endotracheally intubated--- on ventilator   PEG (percutaneous endoscopic gastrostomy) status -Placed 05/15/2019   Palliative care by specialist   Dyspnea and respiratory abnormalities   Increased oropharyngeal secretions  Principal discharge diagnosis: Acute hypoxemic respiratory failure secondary to sepsis from bilateral pneumonia associated with aspiration.  Discharge Instructions  Discharge Instructions    Diet - low sodium heart healthy   Complete by: As directed    Increase activity slowly   Complete by: As directed      Allergies as of 05/23/2019      Reactions   Penicillins    Has patient had a PCN reaction causing immediate rash, facial/tongue/throat swelling, SOB or lightheadedness with hypotension: unknown Has patient had a PCN reaction causing severe rash involving mucus membranes or skin necrosis: unknown Has patient had a PCN reaction that required hospitalization: unknown Has patient had a PCN reaction occurring within the last 10 years: unknown If all of the above answers are "NO", then may  proceed with Cephalosporin use. 5/3 Tolerated Cefepime x 1 dose in ED.      Medication List     STOP taking these medications   azithromycin 500 MG tablet Commonly known as: ZITHROMAX   hydrochlorothiazide 12.5 MG tablet Commonly known as: HYDRODIURIL   sennosides-docusate sodium 8.6-50 MG tablet Commonly known as: SENOKOT-S   valsartan 80 MG tablet Commonly known as: DIOVAN     TAKE these medications   acetaminophen 325 MG tablet Commonly known as: TYLENOL Take 650 mg by mouth every 6 (six) hours as needed for moderate pain.   apixaban 5 MG Tabs tablet Commonly known as: ELIQUIS Place 1 tablet (5 mg total) into feeding tube 2 (two) times daily. What changed: how to take this   atorvastatin 10 MG tablet Commonly known as: LIPITOR Take 10 mg by mouth daily.   bisacodyl 10 MG suppository Commonly known as: DULCOLAX Place 1 suppository (10 mg total) rectally at bedtime.   budesonide-formoterol 80-4.5 MCG/ACT inhaler Commonly known as: SYMBICORT Inhale 2 puffs into the lungs 2 (two) times daily.   carboxymethylcellulose 0.5 % Soln Commonly known as: REFRESH PLUS Place 1 drop into the right eye 3 (three) times daily.   erythromycin ophthalmic ointment Place into the left eye. appy 1 ribbon ophthalmic (eye) 3 times a day   feeding supplement (VITAL HIGH PROTEIN) Liqd liquid Place 1,000 mLs into feeding tube daily.   free water Soln Place 200 mLs into feeding tube every 4 (four) hours.   Incruse Ellipta 62.5 MCG/INH Aepb Generic drug: umeclidinium bromide Inhale 1 puff into the lungs daily.   insulin glargine 100 UNIT/ML injection Commonly known as: LANTUS Inject 0.12 mLs (12 Units total) into the skin daily. Start taking on: May 24, 2019   ipratropium-albuterol 0.5-2.5 (3) MG/3ML Soln Commonly known as: DUONEB Take 3 mLs by nebulization every 6 (six) hours as needed.   labetalol 200 MG tablet Commonly known as: NORMODYNE Take 200 mg by mouth daily. For HTN   loratadine 10 MG tablet Commonly known as: CLARITIN Take 10 mg by mouth daily.    methimazole 5 MG tablet Commonly known as: TAPAZOLE Take 5 mg by mouth daily.   multivitamin with minerals Tabs tablet Place 1 tablet into feeding tube daily. Start taking on: May 24, 2019   pantoprazole sodium 40 mg/20 mL Pack Commonly known as: PROTONIX Place 10 mLs (20 mg total) into feeding tube 2 (two) times daily.   scopolamine 1 MG/3DAYS Commonly known as: TRANSDERM-SCOP Place 1 patch (1.5 mg total) onto the skin every 3 (three) days. Start taking on: May 26, 2019   sennosides 8.8 MG/5ML syrup Commonly known as: SENOKOT Place 5 mLs into feeding tube 2 (two) times daily as needed for mild constipation.      Follow-up Information    Lucretia Roers, MD Follow up.   Specialty: General Surgery Why: as needed if having issues with PEG Contact information: [address] [place] [facility] [address] [phone]        Margit Hanks, MD Follow up in 1 week(s).   Specialty: Internal Medicine Contact information: [address] [place] [address] [phone]          Allergies  Allergen Reactions  . Penicillins     Has patient had a PCN reaction causing immediate rash, facial/tongue/throat swelling, SOB or lightheadedness with hypotension: unknown Has patient had a PCN reaction causing severe rash involving mucus membranes or skin necrosis: unknown Has patient had a PCN reaction  that required hospitalization: unknown Has patient had a PCN reaction occurring within the last 10 years: unknown If all of the above answers are "NO", then may proceed with Cephalosporin use. 5/3 Tolerated Cefepime x 1 dose in ED.     Consultations:  Pulmonology   Procedures/Studies: Ct Abdomen Pelvis Wo Contrast  Result Date: 05/09/2019 CLINICAL DATA:  Abdominal distension. Possible volvulus. EXAM: CT ABDOMEN AND PELVIS WITHOUT CONTRAST TECHNIQUE: Multidetector CT imaging of the abdomen and pelvis was performed following the standard protocol without IV  contrast. COMPARISON:  11/03/2016 FINDINGS: Lower chest: Motion artifact through the lung bases with consolidative opacities in the lower lobes and to a lesser extent posterior lingula and right middle lobe. No pleural effusion. Coronary atherosclerosis. Hepatobiliary: 2 cm cyst inferiorly in the right hepatic lobe, mildly enlarged from 2018. Contracted gallbladder. No biliary dilatation. Pancreas: Unremarkable. Spleen: Unremarkable. Adrenals/Urinary Tract: Unremarkable adrenal glands. Unchanged small calcifications in both renal hila which may represent vascular calcifications or non-obstructing calculi. No hydroureteronephrosis. Unremarkable bladder. Stomach/Bowel: The stomach is collapsed. There is no small bowel dilatation. There is a moderately large amount of stool in the distal sigmoid colon and rectum. The more proximal sigmoid colon is redundant and moderately distended by gas. An ordinary amount of stool is present in the more proximal colon, which is nondilated. Left-sided colonic diverticulosis is noted without evidence of diverticulitis. The appendix is unremarkable. Vascular/Lymphatic: Aortic atherosclerosis without aneurysm. No enlarged lymph nodes. Reproductive: Unremarkable prostate. Other: No ascites or pneumoperitoneum. Fat containing paraumbilical hernia. Musculoskeletal: No acute osseous abnormality or suspicious osseous lesion. Mild lumbar levoscoliosis. Advanced lumbar spondylosis with interbody and facet ankylosis at L4-5. IMPRESSION: 1. Moderately large amount of stool in the distal sigmoid colon and rectum. Moderate gaseous distension of the more proximal sigmoid colon without evidence of volvulus or bowel obstruction. 2. Bibasilar lung consolidation concerning for pneumonia. 3. Aortic Atherosclerosis (ICD10-I70.0). Electronically Signed   By: Sebastian Ache M.D.   On: 05/09/2019 15:45   Dg Chest Port 1 View  Result Date: 05/19/2019 CLINICAL DATA:  Shortness of breath EXAM: PORTABLE  CHEST 1 VIEW COMPARISON:  May 17, 2019 FINDINGS: Endotracheal tube has been removed. No pneumothorax. There is been partial but incomplete clearing of patchy infiltrate from the bases. There is residual consolidation in a portion of the left base with patchy opacity in each lower lung region remaining. There are small pleural effusions. Heart is upper normal in size with pulmonary vascularity normal. No adenopathy. No bone lesions. IMPRESSION: Partial but incomplete clearing of opacity from the basis. Patchy airspace opacity remains in the lower lung regions with consolidation in a portion of the left base. There are small pleural effusions bilaterally. Upper lung regions appear clear. Heart upper normal in size. No adenopathy demonstrable by radiography. Electronically Signed   By: Bretta Bang III M.D.   On: 05/19/2019 08:34   Dg Chest Port 1 View  Result Date: 05/17/2019 CLINICAL DATA:  Respiratory distress, intubated EXAM: PORTABLE CHEST 1 VIEW COMPARISON:  Radiograph 05/16/2019 FINDINGS: Endotracheal tube remains in the mid trachea, 3.5 cm from the carina. Gradient density is obscuring both lung bases likely reflecting pleural fluid. Some persistent airspace opacities noted throughout both lungs as well. No pneumothorax. Cardiomediastinal contours are similar to prior including a calcified, tortuous aorta. No acute osseous or soft tissue abnormality. Degenerative changes are present in the imaged spine and shoulders. IMPRESSION: 1. Endotracheal tube remains in the mid trachea, 3.5 cm from the carina. 2. Bilateral pleural effusions with associated  airspace opacities similar to comparison. 3. No pneumothorax or pneumomediastinum. Electronically Signed   By: Kreg ShropshirePrice  DeHay M.D.   On: 05/17/2019 05:14   Dg Chest Port 1 View  Result Date: 05/16/2019 CLINICAL DATA:  Status post intubation. EXAM: PORTABLE CHEST 1 VIEW COMPARISON:  Single-view of the chest 05/15/2019. FINDINGS: New endotracheal tube  is in place with the tip in good position at the level of the clavicular heads. Right worse than left airspace disease has progressed since yesterday's examination. Heart size is upper normal. There are likely small bilateral pleural effusions. No pneumothorax. IMPRESSION: ETT in good position. Worsened bilateral airspace disease small pleural effusions. Electronically Signed   By: Drusilla Kannerhomas  Dalessio M.D.   On: 05/16/2019 11:57   Dg Chest Port 1 View  Result Date: 05/15/2019 CLINICAL DATA:  Dyspnea and sepsis EXAM: PORTABLE CHEST 1 VIEW COMPARISON:  Two days ago FINDINGS: Cardiomegaly and aortic tortuosity. Low volume chest with hazy density at the left more than right base. No pneumothorax or air bronchogram. Artifact from hardware. IMPRESSION: Low volume chest with hazy opacity at the bases correlating with atelectasis on most recent abdominal CT. Cardiomegaly. Electronically Signed   By: Marnee SpringJonathon  Watts M.D.   On: 05/15/2019 07:28   Dg Chest Port 1 View  Result Date: 05/13/2019 CLINICAL DATA:  Sepsis. EXAM: PORTABLE CHEST 1 VIEW COMPARISON:  05/09/2019 FINDINGS: 0528 hours. Lordotic position. Low lung volumes. The cardio pericardial silhouette is enlarged. There is pulmonary vascular congestion without overt pulmonary edema. Bibasilar collapse/consolidation again noted, left greater than right. IMPRESSION: Cardiomegaly with low lung volumes and bibasilar collapse/consolidation, left greater than right. Electronically Signed   By: Kennith CenterEric  Mansell M.D.   On: 05/13/2019 07:13   Dg Chest Port 1 View  Result Date: 05/09/2019 CLINICAL DATA:  Shortness of breath.  Hypoxia. EXAM: PORTABLE CHEST 1 VIEW COMPARISON:  January 16, 2019. FINDINGS: Stable cardiomediastinal silhouette. No pneumothorax is noted. Hypoinflation of the lungs is noted. Mild bibasilar atelectasis or infiltrates are noted. Bony thorax is unremarkable. IMPRESSION: Mild bibasilar subsegmental atelectasis or infiltrates are noted. Electronically  Signed   By: Lupita RaiderJames  Green Jr M.D.   On: 05/09/2019 13:26     Discharge Exam: Vitals:   05/23/19 0800 05/23/19 0813  BP: 140/81   Pulse: 73   Resp: 20   Temp:  98.6 F (37 C)  SpO2: 91% 91%   Vitals:   05/23/19 0600 05/23/19 0700 05/23/19 0800 05/23/19 0813  BP: 122/84 139/88 140/81   Pulse: 72 79 73   Resp: 19 (!) 22 20   Temp:    98.6 F (37 C)  TempSrc:    Axillary  SpO2: 93% 92% 91% 91%  Weight:      Height:        General exam: Appears calm and comfortable  Respiratory system: Clear to auscultation. Respiratory effort normal.  Currently on 2 L nasal cannula oxygen Cardiovascular system: S1 & S2 heard, RRR. No JVD, murmurs, rubs, gallops or clicks. No pedal edema. Gastrointestinal system: Abdomen is mildly distended, soft and nontender. No organomegaly or masses felt. Normal bowel sounds heard.  PEG tube site is clean dry and intact Central nervous system: Somnolent but arousable Extremities: SCDs noted bilaterally with heel pad dressings.  No edema. Skin: No rashes, lesions or ulcers Psychiatry: Flat affect   The results of significant diagnostics from this hospitalization (including imaging, microbiology, ancillary and laboratory) are listed below for reference.     Microbiology: No results found for this or  any previous visit (from the past 240 hour(s)).   Labs: BNP (last 3 results) No results for input(s): BNP in the last 8760 hours. Basic Metabolic Panel: Recent Labs  Lab 05/16/19 1033 05/16/19 1707 05/17/19 0451 05/17/19 1707 05/18/19 0606 05/18/19 1324 05/20/19 0432 05/21/19 0433  NA  --   --   --   --  139 139 138 135  K  --   --   --   --  3.2* 4.1 3.7 3.8  CL  --   --   --   --  102 100 97* 95*  CO2  --   --   --   --  29 30 29 31   GLUCOSE  --   --   --   --  246* 267* 190* 199*  BUN  --   --   --   --  28* 30* 25* 29*  CREATININE  --   --   --   --  1.21 1.29* 1.10 1.10  CALCIUM  --   --   --   --  7.7* 7.9* 8.3* 8.2*  MG 1.9 2.0 2.0 1.9   --   --   --   --   PHOS 1.6* 1.8* 2.0* 2.2*  --  2.2*  --   --    Liver Function Tests: Recent Labs  Lab 05/18/19 1324 05/20/19 0432  AST  --  51*  ALT  --  43  ALKPHOS  --  51  BILITOT  --  0.2*  PROT  --  7.5  ALBUMIN 2.3* 2.4*   No results for input(s): LIPASE, AMYLASE in the last 168 hours. No results for input(s): AMMONIA in the last 168 hours. CBC: Recent Labs  Lab 05/18/19 0606 05/20/19 0432  WBC 7.8 9.5  HGB 13.3 13.5  HCT 43.2 44.0  MCV 99.1 98.9  PLT 227 264   Cardiac Enzymes: No results for input(s): CKTOTAL, CKMB, CKMBINDEX, TROPONINI in the last 168 hours. BNP: Invalid input(s): POCBNP CBG: Recent Labs  Lab 05/22/19 1625 05/22/19 2031 05/22/19 2338 05/23/19 0337 05/23/19 0738  GLUCAP 224* 190* 211* 215* 217*   D-Dimer No results for input(s): DDIMER in the last 72 hours. Hgb A1c No results for input(s): HGBA1C in the last 72 hours. Lipid Profile No results for input(s): CHOL, HDL, LDLCALC, TRIG, CHOLHDL, LDLDIRECT in the last 72 hours. Thyroid function studies No results for input(s): TSH, T4TOTAL, T3FREE, THYROIDAB in the last 72 hours.  Invalid input(s): FREET3 Anemia work up No results for input(s): VITAMINB12, FOLATE, FERRITIN, TIBC, IRON, RETICCTPCT in the last 72 hours. Urinalysis    Component Value Date/Time   COLORURINE YELLOW 05/09/2019 [place] 05/09/2019 1255   LABSPEC 1.020 05/09/2019 1255   PHURINE 5.0 05/09/2019 1255   GLUCOSEU >=500 (A) 05/09/2019 1255   HGBUR SMALL (A) 05/09/2019 [place] 05/09/2019 [place] 05/09/2019 [place] 05/09/2019 1255   NITRITE NEGATIVE 05/09/2019 1255   LEUKOCYTESUR NEGATIVE 05/09/2019 1255   Sepsis Labs Invalid input(s): PROCALCITONIN,  WBC,  LACTICIDVEN Microbiology No results found for this or any previous visit (from the past 240 hour(s)).   Time coordinating discharge: 35 minutes  SIGNED:   Rodena Goldmann,  DO [facility] 05/23/2019, 9:53 AM  If 7PM-7AM, please contact night-coverage [email or web address]

## 2019-05-24 ENCOUNTER — Non-Acute Institutional Stay (SKILLED_NURSING_FACILITY): Payer: Medicare Other | Admitting: Adult Health

## 2019-05-24 ENCOUNTER — Encounter: Payer: Self-pay | Admitting: Adult Health

## 2019-05-24 DIAGNOSIS — E785 Hyperlipidemia, unspecified: Secondary | ICD-10-CM

## 2019-05-24 DIAGNOSIS — J189 Pneumonia, unspecified organism: Secondary | ICD-10-CM

## 2019-05-24 DIAGNOSIS — K117 Disturbances of salivary secretion: Secondary | ICD-10-CM

## 2019-05-24 DIAGNOSIS — Z931 Gastrostomy status: Secondary | ICD-10-CM

## 2019-05-24 DIAGNOSIS — Z794 Long term (current) use of insulin: Secondary | ICD-10-CM

## 2019-05-24 DIAGNOSIS — I69391 Dysphagia following cerebral infarction: Secondary | ICD-10-CM

## 2019-05-24 DIAGNOSIS — E1165 Type 2 diabetes mellitus with hyperglycemia: Secondary | ICD-10-CM | POA: Diagnosis not present

## 2019-05-24 DIAGNOSIS — J410 Simple chronic bronchitis: Secondary | ICD-10-CM

## 2019-05-24 DIAGNOSIS — E0590 Thyrotoxicosis, unspecified without thyrotoxic crisis or storm: Secondary | ICD-10-CM

## 2019-05-24 NOTE — Progress Notes (Signed)
Location:    [place] Room Number: 152/W Place of Service:  SNF (31)   CODE STATUS: Full Code  Allergies  Allergen Reactions  . Penicillins     Has patient had a PCN reaction causing immediate rash, facial/tongue/throat swelling, SOB or lightheadedness with hypotension: unknown Has patient had a PCN reaction causing severe rash involving mucus membranes or skin necrosis: unknown Has patient had a PCN reaction that required hospitalization: unknown Has patient had a PCN reaction occurring within the last 10 years: unknown If all of the above answers are "NO", then may proceed with Cephalosporin use. 5/3 Tolerated Cefepime x 1 dose in ED.     Chief Complaint  Patient presents with  . Hospitalization Follow-up    Hospitalization Follow Up    HPI:  He is a 73 year old long term resident of this facility who has been hospitalized from 05-09-19 through 05-23-19. He was treated for bilateral pneumonia more than likely aspiration. He had been on honey thick liquids prior to his hospitalization. He had a peg tube placed on 05-15-19 for nutritional support. He had been intubated from 05-15-19 through 22-27-20 after his peg tube placement due to hypoxia. He has complete his abt. He has new onset diabetes type 2. There are no reports of uncontrolled pain; no signs of further aspiration; no reports of insomnia. He will continue to be followed for his chronic illnesses including: dysphagia; diabetes; hypertension    Past Medical History:  Diagnosis Date  . Dysphasia   . Fatty liver   . Hemiplegia ([place])   . Hypertension   . Pneumonia   . Pulmonary embolism ([place])   . 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([place])   . Stroke [facility])     Past Surgical History:  Procedure Laterality Date  . ESOPHAGOGASTRODUODENOSCOPY (EGD) WITH PROPOFOL N/A 05/15/2019   Procedure: ESOPHAGOGASTRODUODENOSCOPY (EGD) WITH PROPOFOL;  Surgeon: Virl Cagey, MD;  Location: [facility] ORS;  Service: General;   Laterality: N/A;  . FLEXIBLE SIGMOIDOSCOPY N/A 10/21/2016   Procedure: FLEXIBLE SIGMOIDOSCOPY;  Surgeon: Rogene Houston, MD;  Location: [facility] ENDO SUITE;  Service: Endoscopy;  Laterality: N/A;  . FLEXIBLE SIGMOIDOSCOPY N/A 10/25/2016   Procedure: FLEXIBLE SIGMOIDOSCOPY;  Surgeon: Rogene Houston, MD;  Location: [facility] ENDO SUITE;  Service: Endoscopy;  Laterality: N/A;  . PEG PLACEMENT N/A 05/15/2019   Procedure: PERCUTANEOUS ENDOSCOPIC GASTROSTOMY (PEG) PLACEMENT;  Surgeon: Virl Cagey, MD;  Location: [facility] ORS;  Service: General;  Laterality: N/A;  . unable      Social History   Socioeconomic History  . Marital status: Single    Spouse name: Not on file  . Number of children: Not on file  . Years of education: Not on file  . Highest education level: Not on file  Occupational History  . Not on file  Social Needs  . Financial resource strain: Not hard at all  . Food insecurity    Worry: Never true    Inability: Never true  . Transportation needs    Medical: No    Non-medical: No  Tobacco Use  . Smoking status: Never Smoker  . Smokeless tobacco: Never Used  Substance and Sexual Activity  . Alcohol use: No    Alcohol/week: 0.0 standard drinks  . Drug use: No  . Sexual activity: Not on file  Lifestyle  . Physical activity    Days per week: 0 days    Minutes per session: 0 min  . Stress: Not at all  Relationships  .  Social Herbalist on phone: Never    Gets together: Once a week    Attends religious service: Never    Active member of club or organization: No    Attends meetings of clubs or organizations: Never    Relationship status: Never married  . Intimate partner violence    Fear of current or ex partner: No    Emotionally abused: No    Physically abused: No    Forced sexual activity: No  Other Topics Concern  . Not on file  Social History Narrative  . Not on file   No family history on file.    VITAL SIGNS BP 133/79   Pulse 83   Temp 97.8 F  (36.6 C) (Oral)   Resp 19   Ht 5' 8.5" (1.74 m)   Wt 230 lb 3.2 oz (104.4 kg)   SpO2 91%   BMI 34.49 kg/m   Outpatient Encounter Medications as of 05/24/2019  Medication Sig  . acetaminophen (TYLENOL) 325 MG tablet Take 650 mg by mouth every 6 (six) hours as needed for moderate pain.   Marland Kitchen apixaban (ELIQUIS) 5 MG TABS tablet Place 1 tablet (5 mg total) into feeding tube 2 (two) times daily.  Marland Kitchen atorvastatin (LIPITOR) 10 MG tablet Take 10 mg by mouth daily.  . bisacodyl (DULCOLAX) 10 MG suppository Place 1 suppository (10 mg total) rectally at bedtime.  . Blood Glucose Monitoring Suppl w/Device KIT x 1 week and report to provider Before Meals 07:30 AM, 11:30 AM, 04:30 PM  . budesonide-formoterol (SYMBICORT) 80-4.5 MCG/ACT inhaler Inhale 2 puffs into the lungs 2 (two) times daily.  . carboxymethylcellulose (REFRESH PLUS) 0.5 % SOLN Place 1 drop into the right eye 3 (three) times daily.   Marland Kitchen erythromycin ophthalmic ointment Place into the left eye. appy 1 ribbon ophthalmic (eye) 3 times a day  . Insulin Glargine (BASAGLAR KWIKPEN) 100 UNIT/ML SOPN Inject 12 Units into the skin at bedtime.  Marland Kitchen ipratropium-albuterol (DUONEB) 0.5-2.5 (3) MG/3ML SOLN Take 3 mLs by nebulization every 6 (six) hours as needed.  . labetalol (NORMODYNE) 200 MG tablet Take 200 mg by mouth daily. For HTN  . loratadine (CLARITIN) 10 MG tablet Take 10 mg by mouth daily.  . methimazole (TAPAZOLE) 5 MG tablet Take 5 mg by mouth daily.  . Multiple Vitamin (MULTIVITAMIN WITH MINERALS) TABS tablet Place 1 tablet into feeding tube daily.  . NON FORMULARY Diet: Regular, NAS, Consistent Carbohydrate  . OXYGEN Inhale 2 L into the lungs. Every shift  . [START ON 05/26/2019] scopolamine (TRANSDERM-SCOP) 1 MG/3DAYS Place 1 patch (1.5 mg total) onto the skin every 3 (three) days.  . sennosides (SENOKOT) 8.8 MG/5ML syrup Place 5 mLs into feeding tube 2 (two) times daily as needed for mild constipation.  Marland Kitchen umeclidinium bromide (INCRUSE  ELLIPTA) 62.5 MCG/INH AEPB Inhale 1 puff into the lungs daily.  . Water For Irrigation, Sterile (FREE WATER) SOLN Place 300 mLs into feeding tube. Every shift   No facility-administered encounter medications on file as of 05/24/2019.      SIGNIFICANT DIAGNOSTIC EXAMS  PREVIOUS;   01-16-19: chest x-ray: Infiltrate versus atelectasis at LEFT base   05-05-19: chest x-ray: 1. Normal chest x-ray.  2. No tuberculosis is noted   05-08-19: KUB: Findings suggest constipation, fecal impaction or decreased colonic motility. Correlate with risk factors for constipation or fecal stasis, based on frequency or absence of bowel movements. Overall sensitivity is decreased, due to attenuation from patient body habitus.  Soft tissue attenuation results in significantly diminished diagnostic sensitivity and utility.  TODAY  05-09-19: chest x-ray: Mild bibasilar subsegmental atelectasis or infiltrates are noted   05-09-19: ct of abdomen and pelvis:  1. Moderately large amount of stool in the distal sigmoid colon and rectum. Moderate gaseous distension of the more proximal sigmoid colon without evidence of volvulus or bowel obstruction. 2. Bibasilar lung consolidation concerning for pneumonia. 3. Aortic Atherosclerosis   07-12-18: chest x-ray: Cardiomegaly with low lung volumes and bibasilar collapse/consolidation, left greater than right.  05-16-19: chest x-ray: ETT in good position. Worsened bilateral airspace disease small pleural effusions.  05-19-19: chest x-ray: Partial but incomplete clearing of opacity from the basis. Patchy airspace opacity remains in the lower lung regions with consolidation in a portion of the left base. There are small pleural effusions bilaterally. Upper lung regions appear clear. Heart upper normal in size. No adenopathy demonstrable by radiography.    LABS REVIEWED: PREVIOUS   06-02-18: glucose 88; bun 25; creat 1.17; k+ 4.0; na++ 137; ca 8.7  07-28-18: tsh 0.299; free  T3: 2.8; free T4: 0.92 08-17-18: INR 2.5  09-14-18: INR 2.7 10-05-18: INR 3.3 10-12-18: INR 1.9  10-19-18: INR 2.9 10-26-18: wbc 8.8; hgb 15.7; hct 49.2; mcv 95.7; plt 240; glucose 74; bun 25; creat 1.05; k+ 3.9; na++ 141; ca 8.9 INR 2.1  11-27-18: INR 2.7 tsh 0.051 free T3: 3.2; free T4: 1.16 12-11-18: INR 3.3 12-13-18: INR 2.7 12-20-18: INR 2.4 tsh 0.444 free T3: 2.4 free T4: 0.90 01-02-19: INR 2.9  01-09-19: INR 3.5 01-16-19: INR 4.4 01-18-19: INR 3.3  01-23-19: INR 1.4  02-05-19: wbc 9.9; hgb 15.3; hct 482; mcv 96.4; plt 236; glucose 99; bun 46; creat 1.54; k+ 4.0; na++ 139; ca 8.7; liver normal albumin 3.2; chol 106; ldl 62; trig 54; hdl 33; PSA 10.58 (scheduled for 03-31-19)  03-16-19: tsh 2.668 free T4: 0.81 05-01-19: PSA 11.82  TODAY;.  05-05-19: wbc 12.3; hgb 16.7; hct 53.4; mcv 98.2 plt 246; blood culture: no growth 05-08-19: PSA 16.39 05-09-19: wbc 13.3; hgb 16.9; hct 56.5; mcv 102.7 plt 261; glucose 689; bun 82; creat 2.78; k+ 5.3; na++ 151; ca 8.9; urine culture: enterococcus faecalis:  Hgb a1c 11.3 05-11-19: glucose 98; bun 36; creat 1.61; k+ 3.8; na++ 158; ca 7.8; phos 2.0 albumin 2.6 05-15-19: wbc 8.2; hgb 12.9; hct 42.2; mcv 100.2 plt 165; glucose 178; bun 15; creat 1.37; k+ 3.5; na++ 143; ca 7.5  05-18-19: wbc 7.8; hgb 13.3; hct 43.2; mcv 99.1 plt 227; glucose 246; bun 28; creat 1.21; k+ 3.2; na++ 139 ca 7.7 05-20-19: wbc 9.5; hgb 13.3; hct 44.0; mcv 98.9 plt 264; glucose 190; bun 25; creat 1.10; k+ 3.7; na++ 138; ca 8.3 ;liver normal albumin 2.4     Review of Systems  Constitutional: Negative for malaise/fatigue.  Respiratory: Negative for cough and shortness of breath.   Cardiovascular: Negative for chest pain, palpitations and leg swelling.  Gastrointestinal: Negative for abdominal pain, constipation and heartburn.  Musculoskeletal: Negative for back pain, joint pain and myalgias.  Skin: Negative.   Neurological: Negative for dizziness.  Psychiatric/Behavioral: The patient is  not nervous/anxious.    Physical Exam Constitutional:      General: He is not in acute distress.    Appearance: He is well-developed. He is obese. He is not diaphoretic.  Neck:     Thyroid: No thyromegaly.  Cardiovascular:     Rate and Rhythm: Normal rate and regular rhythm.     Heart sounds:  Normal heart sounds.  Pulmonary:     Effort: Pulmonary effort is normal. No respiratory distress.     Breath sounds: Normal breath sounds.     Comments: 02  Abdominal:     General: Bowel sounds are normal. There is no distension.     Palpations: Abdomen is soft.     Tenderness: There is no abdominal tenderness.     Comments: Peg tube present   Musculoskeletal:     Cervical back: Neck supple.     Right lower leg: No edema.     Left lower leg: No edema.     Comments: Right hemiplegia Is able to move left extremities         Lymphadenopathy:     Cervical: No cervical adenopathy.  Skin:    General: Skin is warm and dry.  Neurological:     Mental Status: He is alert. Mental status is at baseline.  Psychiatric:        Mood and Affect: Mood normal.        ASSESSMENT/ PLAN:  TODAY:  1. Type 2 diabetes mellitus new onset with long term current use of insulin: is without change in status will continue basaglar 12 units nightly and will monitor his status.   2. Simple chronic bronchitis: is stable is 02 dependent: will continue symbicort 80/4.5 mcg 2 puffs twice daily claritin 10 mg daily incruse ellipta 62.5 mcg 1 puff daily   3. Dysphagia due to old cerebrovascular accident on honey thick liquids/increased oral secretions/new peg tube/ pneumonia of both lungs due to infectious process: is now NPO will change him to bolus feedings; will stop scopolamine due to sigmoid volvulus: will use atropine drops as needed for increased oral secretions.   4. 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 Essential hypertension: is stable b/p 133/79 will continue labetalol  200 mg daily   4. 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 Dyslipidemia: is stable LDL 54 will continue lipitor 10 gm daily   6. Hyperthyroidism: is stable tsh 0.051 free t3: 3.2 free t4: 1.16will continue tapazole 5 mg daily   7. Deep vein thrombosis (DVT) of left extremity unspecified/other chronic pulmonary embolism without acute cor pulmonale: is stable is on long term eliquis 5 mg twice daily   Will check bmp 05-28-19    MD is aware of resident's narcotic use and is in agreement with current plan of care. We will attempt to wean resident as appropriate.  Ok Edwards NP [facility]  Contact [phone] Monday through Friday 8am- 5pm  After hours call [phone]

## 2019-05-27 ENCOUNTER — Other Ambulatory Visit: Payer: Self-pay | Admitting: Internal Medicine

## 2019-05-27 ENCOUNTER — Other Ambulatory Visit (HOSPITAL_COMMUNITY)
Admission: RE | Admit: 2019-05-27 | Discharge: 2019-05-27 | Disposition: A | Discharge status: Home / Self Care | Payer: Medicare Other | Source: Ambulatory Visit | Attending: Internal Medicine | Admitting: Internal Medicine

## 2019-05-27 DIAGNOSIS — Z20828 Contact with and (suspected) exposure to other viral communicable diseases: Secondary | ICD-10-CM | POA: Insufficient documentation

## 2019-05-27 DIAGNOSIS — Z9189 Other specified personal risk factors, not elsewhere classified: Secondary | ICD-10-CM

## 2019-05-28 ENCOUNTER — Encounter (HOSPITAL_COMMUNITY)
Admission: RE | Admit: 2019-05-28 | Discharge: 2019-05-28 | Disposition: A | Discharge status: Home / Self Care | Payer: Medicare Other | Source: Skilled Nursing Facility | Attending: Adult Health | Admitting: Adult Health

## 2019-05-28 DIAGNOSIS — E1165 Type 2 diabetes mellitus with hyperglycemia: Secondary | ICD-10-CM | POA: Insufficient documentation

## 2019-05-28 DIAGNOSIS — E0590 Thyrotoxicosis, unspecified without thyrotoxic crisis or storm: Secondary | ICD-10-CM | POA: Insufficient documentation

## 2019-05-28 LAB — BASIC METABOLIC PANEL
Anion gap: 11 (ref 5–15)
BUN: 20 mg/dL (ref 8–23)
CO2: 30 mmol/L (ref 22–32)
Calcium: 9.5 mg/dL (ref 8.9–10.3)
Chloride: 93 mmol/L — ABNORMAL LOW (ref 98–111)
Creatinine, Ser: 0.96 mg/dL (ref 0.61–1.24)
GFR calc Af Amer: >60 mL/min (ref >60)
GFR calc non Af Amer: >60 mL/min (ref >60)
Glucose, Bld: 240 mg/dL — ABNORMAL HIGH (ref 70–99)
Potassium: 4.8 mmol/L (ref 3.5–5.1)
Sodium: 134 mmol/L — ABNORMAL LOW (ref 135–145)

## 2019-05-29 ENCOUNTER — Encounter: Payer: Self-pay | Admitting: Internal Medicine

## 2019-05-29 NOTE — Progress Notes (Signed)
: Provider:  [name]., MD Location:  [place] Room Number: 152-W Place of Service:  SNF ([phone])  PCP: [name], MD Patient Care Team: [name], MD as PCP - General (Internal Medicine) [facility], [place] ([place]) Nyoka Cowden, Phylis Bougie, NP as Nurse Practitioner (Geriatric Medicine)  Extended Emergency Contact Information Primary Emergency Contact: Malachy Chamber Address: [address]          [place], [place] [address] Johnnette Litter of [place] Phone: [phone] Mobile Phone: [phone] Relation: Brother     Allergies: Penicillins  Chief Complaint  Patient presents with  . Readmit To SNF    Readmission to [facility]    HPI: Patient is a 73 y.o. 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 with hypoxia requiring nonrebreather oxygen.  In the ED he was found to have bilateral pneumonia probably aspiration and severe hyperglycemia and hypernatremia.  Patient was admitted from 11/18-12/2.  He was intubated for 3 days secondary to sepsis, pneumonia.Marland Kitchen  He was also noted to have new onset type 2 diabetes with a hemoglobin A1c of 11.3.  Patient had a PEG tube placed on 11/24 for chronic aspiration, which of course will not solve anything as aspiration continues with the PEG tube.  Patient is admitted back to skilled nursing facility for supportive care.  While at skilled nursing facility patient will be followed for chronic PE treated with Eliquis, hyperlipidemia treated with Lipitor, and GERD treated with Protonix twice daily  Past Medical History:  Diagnosis Date  . Dysphasia   . Fatty liver   . Hemiplegia ([place])   . Hypertension   . Pneumonia   . Pulmonary embolism ([place])   . Sigmoid  volvulus ([place])   . Stroke [facility])     Past Surgical History:  Procedure Laterality Date  . ESOPHAGOGASTRODUODENOSCOPY (EGD) WITH PROPOFOL N/A 05/15/2019   Procedure: ESOPHAGOGASTRODUODENOSCOPY (EGD) WITH PROPOFOL;  Surgeon: Virl Cagey, MD;  Location: [facility] ORS;  Service: General;  Laterality: N/A;  . FLEXIBLE SIGMOIDOSCOPY N/A 10/21/2016   Procedure: FLEXIBLE SIGMOIDOSCOPY;  Surgeon: Rogene Houston, MD;  Location: [facility] ENDO SUITE;  Service: Endoscopy;  Laterality: N/A;  . FLEXIBLE SIGMOIDOSCOPY N/A 10/25/2016   Procedure: FLEXIBLE SIGMOIDOSCOPY;  Surgeon: Rogene Houston, MD;  Location: [facility] END[facility] SU[facility];  Service: Endoscopy;  Laterality: N/A;  . PEG PLACEMENT N/A 05/15/2019   Procedure: PERCUTANEOUS ENDOSCOPIC GASTROSTOMY (PEG) PLACEMENT;  Surgeon: Virl Cagey, MD;  Location: [facility] ORS;  Service: General;  Laterality: N/A;  . unable      Allergies as of 05/30/2019      Reactions   Penicillins    Has patient had a PCN reaction causing immediate rash, facial/tongue/throat swelling, SOB or lightheadedness with hypotension: unknown Has patient had a PCN reaction causing severe rash involving mucus membranes or skin necrosis: unknown Has patient had a PCN reaction that required hospitalization: unknown Has patient had a PCN reaction occurring within the last 10 years: unknown If all of the above answers are "NO", then may proceed with Cephalosporin use. 5/3 Tolerated Cefepime x 1 dose in ED.      Medication List    Notice   This visit is during an admission. Changes to the med list made in this visit will be reflected in the After  Visit Summary of the admission.     No orders of the defined types were placed in this encounter.   Immunization History  Administered Date(s) Administered  . Influenza-Unspecified 03/28/2014, 03/23/2016, 03/25/2017, 03/23/2018, 03/26/2019  . Pneumococcal Conjugate-13 04/21/2015  . Pneumococcal-Unspecified 11/01/2009, 03/30/2016  . Tdap 03/17/2017     Social History   Tobacco Use  . Smoking status: Never Smoker  . Smokeless tobacco: Never Used  Substance Use Topics  . Alcohol use: No    Alcohol/week: 0.0 standard drinks    Family history is   History reviewed. No pertinent family history.    Review of Systems  GENERAL:  no fevers, fatigue, appetite changes SKIN: No itching, or rash EYES: No eye pain, redness, discharge EARS: No earache, tinnitus, change in hearing NOSE: No congestion, drainage or bleeding  MOUTH/THROAT: No mouth or tooth pain, No sore throat RESPIRATORY: No cough, wheezing, SOB CARDIAC: No chest pain, palpitations, lower extremity edema  GI: No abdominal pain, No N/V/D or constipation, No heartburn or reflux  GU: No dysuria, frequency or urgency, or incontinence  MUSCULOSKELETAL: No unrelieved bone/joint pain NEUROLOGIC: No headache, dizziness or focal weakness PSYCHIATRIC: No c/o anxiety or sadness   Vitals:   05/29/19 1621  BP: 129/86  Pulse: 80  Resp: 20  Temp: (!) 97.2 F (36.2 C)  SpO2: 96%    SpO2 Readings from Last 1 Encounters:  05/29/19 96%   Body mass index is 35 kg/m.     Physical Exam  GENERAL APPEARANCE: Alert, conversant,  No acute distress.  SKIN: No diaphoresis rash HEAD: Normocephalic, atraumatic  EYES: Conjunctiva/lids clear. Pupils round, reactive. EOMs intact.  EARS: External exam WNL, canals clear. Hearing grossly normal.  NOSE: No deformity or discharge.  MOUTH/THROAT: Lips w/o lesions  RESPIRATORY: Breathing is even, unlabored. Lung sounds are clear   CARDIOVASCULAR: Heart RRR no murmurs, rubs or gallops. No peripheral edema.   GASTROINTESTINAL: Abdomen is soft, non-tender, not distended w/ normal bowel sounds; PEG tube. GENITOURINARY: Bladder non tender, not distended  MUSCULOSKELETAL: No abnormal joints or musculature NEUROLOGIC:  Cranial nerves 2-12 grossly intact; right-sided hemiparesis PSYCHIATRIC: Mood and affect appropriate to situation, no behavioral  issues  Patient Active Problem List   Diagnosis Date Noted  . Palliative care by specialist   . Dyspnea and respiratory abnormalities   . Increased oropharyngeal secretions   . Endotracheally intubated--- on ventilator 05/15/2019  . PEG (percutaneous endoscopic gastrostomy) status -Placed 05/15/2019 05/15/2019  . Enterococcus faecalis UTI infection 05/11/2019  . Goals of care, counseling/discussion   . Palliative care encounter   . Bil PNA (pneumonia)--suspect aspiration related 05/09/2019  . Hypernatremia 05/09/2019  . Sepsis secondary to pneumonia 05/09/2019  . Acute hypoxic respiratory failure due to bilateral pneumonia 05/09/2019  . Diabetes mellitus with hyperglycemia--new diagnosis 05/09/2019  . AKI (acute kidney injury) (HCC) 05/09/2019  . HCAP (healthcare-associated pneumonia) 05/04/2019  . Elevated PSA, between 10 and less than 20 ng/ml 02/08/2019  . Aspiration pneumonia of right lower lobe (HCC) 01/30/2019  . 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 Dysphagia due to old cerebrovascular accident-on honey thickened liquids 08/25/2018  . Dyslipidemia 08/25/2018  . Hyperthyroidism 04/05/2017  . Hypertension 10/27/2016  . 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 COPD (chronic obstructive pulmonary disease) (HCC) 10/19/2016  . 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 Long term current use of anticoagulant therapy-on Eliquis due  to history of DVT/PE and strokes 10/21/2012  . DVT (deep venous thrombosis) (HCC) 09/26/2012  . Chronic pulmonary embolism (HCC) 09/26/2012  . Aspiration pneumonia of left lower lobe (HCC) 09/26/2012  . Constipation 09/26/2012      Labs reviewed: Basic Metabolic Panel:    Component Value Date/Time   NA 134 (L) 05/28/2019 0612   NA 142 09/12/2015   K 4.8 05/28/2019 0612   CL 93 (L)  05/28/2019 0612   CO2 30 05/28/2019 0612   GLUCOSE 240 (H) 05/28/2019 0612   BUN 20 05/28/2019 0612   BUN 18 09/12/2015   CREATININE 0.96 05/28/2019 0612   CALCIUM 9.5 05/28/2019 0612   PROT 7.5 05/20/2019 0432   ALBUMIN 2.4 (L) 05/20/2019 0432   AST 51 (H) 05/20/2019 0432   ALT 43 05/20/2019 0432   ALKPHOS 51 05/20/2019 0432   BILITOT 0.2 (L) 05/20/2019 0432   GFRNONAA >60 05/28/2019 0612   GFRAA >60 05/28/2019 0612    Recent Labs    05/16/19 1707 05/17/19 0451 05/17/19 1707  05/18/19 1324 05/20/19 0432 05/21/19 0433 05/28/19 0612  NA  --   --   --    < > 139 138 135 134*  K  --   --   --    < > 4.1 3.7 3.8 4.8  CL  --   --   --    < > 100 97* 95* 93*  CO2  --   --   --    < > 30 29 31 30   GLUCOSE  --   --   --    < > 267* 190* 199* 240*  BUN  --   --   --    < > 30* 25* 29* 20  CREATININE  --   --   --    < > 1.29* 1.10 1.10 0.96  CALCIUM  --   --   --    < > 7.9* 8.3* 8.2* 9.5  MG 2.0 2.0 1.9  --   --   --   --   --   PHOS 1.8* 2.0* 2.2*  --  2.2*  --   --   --    < > = values in this interval not displayed.   Liver Function Tests: Recent Labs    02/05/19 0620 05/09/19 1310  05/14/19 0428 05/18/19 1324 05/20/19 0432  AST 17 60*  --   --   --  51*  ALT 17 69*  --   --   --  43  ALKPHOS 50 72  --   --   --  51  BILITOT 0.4 0.7  --   --   --  0.2*  PROT 7.2 8.7*  --   --   --  7.5  ALBUMIN 3.2* 3.4*   < > 2.2* 2.3* 2.4*   < > = values in this interval not displayed.   No results for input(s): LIPASE, AMYLASE in the last 8760 hours. No results for input(s): AMMONIA in the last 8760 hours. CBC: Recent Labs    05/09/19 1200 05/09/19 1310  05/15/19 0549 05/16/19 0423 05/18/19 0606 05/20/19 0432  WBC 13.3* 12.1*   < > 8.2 8.0 7.8 9.5  NEUTROABS 10.4* 9.4*  --  5.7  --   --   --   HGB 16.9 17.3*   < > 12.9* 12.5* 13.3 13.5  HCT 56.5* 58.4*   < > 42.2 41.0 43.2 44.0  MCV 102.7*  103.0*   < > 100.2* 99.5 99.1 98.9  PLT 261 259   < > 165 177 227 264   < >  = values in this interval not displayed.   Lipid Recent Labs    02/05/19 0620 05/16/19 0423 05/17/19 0451 05/18/19 0606  CHOL 106  --   --   --   HDL 33*  --   --   --   LDLCALC 62  --   --   --   TRIG 54 98 125 164*    Cardiac Enzymes: No results for input(s): CKTOTAL, CKMB, CKMBINDEX, TROPONINI in the last 8760 hours. BNP: No results for input(s): BNP in the last 8760 hours. No results found for: [facility] Lab Results  Component Value Date   HGBA1C 11.3 (H) 05/09/2019   Lab Results  Component Value Date   TSH 2.668 03/16/2019   Lab Results  Component Value Date   VITAMINB12 247 01/06/2017   Lab Results  Component Value Date   FOLATE 10.1 01/06/2017   Lab Results  Component Value Date   IRON 75 01/06/2017   TIBC 272 01/06/2017   FERRITIN 87 01/06/2017    Imaging and Procedures obtained prior to SNF admission: No results found.   Not all labs, radiology exams or other studies done during hospitalization come through on my EPIC note; however they are reviewed by me.    Assessment and Plan  Acute respiratory failure with hypoxia requiring intubation/bilateral pneumonia sepsis -treated with IV antibiotics and patient was able to be extubated in 3 days SNF-[facility] to [facility] for supportive care  PEG tube placement-for recurrent aspiration-which will not help anything as people aspirate on PEG tube status is much they do with eating SNF-continuous feeds per PEG to with free water boluses  New onset diabetes type 2 with hyperglycemia-hemoglobin A1c 11.3 SNF-continue Lantus 12 units daily; will titrate up as needed  Chronic DVT/PE SNF-continue 5 mg twice daily  Hyperlipidemia SNF-not stated as uncontrolled; continue Lipitor 10 mg daily  GERD SNF-continue Protonix 20 mg per feeding tube 3 times daily   Time spent greater than 35 minutes;;> 50% of time with patient was spent reviewing records, labs, tests and studies, counseling and developing  plan of care  Margit Hanks, MD

## 2019-05-30 ENCOUNTER — Non-Acute Institutional Stay (SKILLED_NURSING_FACILITY): Payer: Medicare Other | Admitting: Internal Medicine

## 2019-05-30 ENCOUNTER — Encounter: Payer: Self-pay | Admitting: Internal Medicine

## 2019-05-30 DIAGNOSIS — I2782 Chronic pulmonary embolism: Secondary | ICD-10-CM

## 2019-05-30 DIAGNOSIS — J690 Pneumonitis due to inhalation of food and vomit: Secondary | ICD-10-CM | POA: Diagnosis not present

## 2019-05-30 DIAGNOSIS — I69391 Dysphagia following cerebral infarction: Secondary | ICD-10-CM

## 2019-05-30 DIAGNOSIS — E1165 Type 2 diabetes mellitus with hyperglycemia: Secondary | ICD-10-CM

## 2019-05-30 DIAGNOSIS — A419 Sepsis, unspecified organism: Secondary | ICD-10-CM

## 2019-05-30 DIAGNOSIS — J9601 Acute respiratory failure with hypoxia: Secondary | ICD-10-CM | POA: Diagnosis not present

## 2019-05-30 DIAGNOSIS — Z794 Long term (current) use of insulin: Secondary | ICD-10-CM

## 2019-05-30 DIAGNOSIS — E785 Hyperlipidemia, unspecified: Secondary | ICD-10-CM

## 2019-05-30 DIAGNOSIS — Z931 Gastrostomy status: Secondary | ICD-10-CM | POA: Diagnosis not present

## 2019-05-30 DIAGNOSIS — R6520 Severe sepsis without septic shock: Secondary | ICD-10-CM

## 2019-05-30 DIAGNOSIS — K219 Gastro-esophageal reflux disease without esophagitis: Secondary | ICD-10-CM

## 2019-05-31 ENCOUNTER — Encounter: Payer: Self-pay | Admitting: Adult Health

## 2019-05-31 ENCOUNTER — Non-Acute Institutional Stay (SKILLED_NURSING_FACILITY): Payer: Medicare Other | Admitting: Adult Health

## 2019-05-31 DIAGNOSIS — I10 Essential (primary) hypertension: Secondary | ICD-10-CM

## 2019-05-31 DIAGNOSIS — I69391 Dysphagia following cerebral infarction: Secondary | ICD-10-CM

## 2019-05-31 DIAGNOSIS — J410 Simple chronic bronchitis: Secondary | ICD-10-CM | POA: Diagnosis not present

## 2019-05-31 NOTE — Progress Notes (Signed)
Location:    [facility] Room Number: 112/W Place of Service:  SNF (31)   CODE STATUS: Full Code  Allergies  Allergen Reactions  . Penicillins     Has patient had a PCN reaction causing immediate rash, facial/tongue/throat swelling, SOB or lightheadedness with hypotension: unknown Has patient had a PCN reaction causing severe rash involving mucus membranes or skin necrosis: unknown Has patient had a PCN reaction that required hospitalization: unknown Has patient had a PCN reaction occurring within the last 10 years: unknown If all of the above answers are "NO", then may proceed with Cephalosporin use. 5/3 Tolerated Cefepime x 1 dose in ED.    Chief Complaint  Patient presents with  . Medical Management of Chronic Issues        Simple chronic bronchitis:   Dysphagia due to old cerebrovascular accident on honey thick liquids/increased oral secretions/new peg tube: is NPO will continue bolus feedings has atropine drops as needed for increased oral secretions.   Essential hypertension:  Weekly follow up for the first 30 days post hospitalization.      HPI:  He is a 73 year old long term resident of this facility being seen for the management of her chronic illnesses: bronchitis; dysphagia; hypertension. He continues to have increased oral secretions. No reports of aspiration. No uncontrolled pain no anxiety or agitation.   Past Medical History:  Diagnosis Date  . Dysphasia   . Fatty liver   . Hemiplegia (HCC)   . Hypertension   . Pneumonia   . Pulmonary embolism (HCC)   . 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(HCC)   . Stroke [facility](HCC)     Past Surgical History:  Procedure Laterality Date  . ESOPHAGOGASTRODUODENOSCOPY (EGD) WITH PROPOFOL N/A 05/15/2019   Procedure: ESOPHAGOGASTRODUODENOSCOPY (EGD) WITH PROPOFOL;  Surgeon: Lucretia RoersBridges, Lindsay C, MD;  Location: [facility] ORS;  Service: General;  Laterality: N/A;  . FLEXIBLE SIGMOIDOSCOPY N/A 10/21/2016   Procedure: FLEXIBLE  SIGMOIDOSCOPY;  Surgeon: Malissa Hippoehman, Najeeb U, MD;  Location: [facility] SUITE;  Service: Endoscopy;  Laterality: N/A;  . FLEXIBLE SIGMOIDOSCOPY N/A 10/25/2016   Procedure: FLEXIBLE SIGMOIDOSCOPY;  Surgeon: Malissa Hippoehman, Najeeb U, MD;  Location: [facility];  Service: Endoscopy;  Laterality: N/A;  . PEG PLACEMENT N/A 05/15/2019   Procedure: PERCUTANEOUS ENDOSCOPIC GASTROSTOMY (PEG) PLACEMENT;  Surgeon: Lucretia RoersBridges, Lindsay C, MD;  Location: [facility] ORS;  Service: General;  Laterality: N/A;  . unable      Social History   Socioeconomic History  . Marital status: Single    Spouse name: Not on file  . Number of children: Not on file  . Years of education: Not on file  . Highest education level: Not on file  Occupational History  . Not on file  Tobacco Use  . Smoking status: Never Smoker  . Smokeless tobacco: Never Used  Substance and Sexual Activity  . Alcohol use: No    Alcohol/week: 0.0 standard drinks  . Drug use: No  . Sexual activity: Not on file  Other Topics Concern  . Not on file  Social History Narrative  . Not on file   Social Determinants of Health   Financial Resource Strain: Low Risk   . Difficulty of Paying Living Expenses: Not hard at all  Food Insecurity: No Food Insecurity  . Worried About Programme researcher, broadcasting/film/videounning Out of Food in the Last Year: Never true  . Ran Out of Food in the Last Year: Never true  Transportation Needs: No Transportation Needs  . Lack of Transportation (  Medical): No  . Lack of Transportation (Non-Medical): No  Physical Activity: Inactive  . Days of Exercise per Week: 0 days  . Minutes of Exercise per Session: 0 min  Stress: No Stress Concern Present  . Feeling of Stress : Not at all  Social Connections: Severely Isolated  . Frequency of Communication with Friends and Family: Never  . Frequency of Social Gatherings with Friends and Family: Once a week  . Attends Religious Services: Never  . Active Member of Clubs or Organizations: No  . Attends Banker  Meetings: Never  . Marital Status: Never married  Intimate Partner Violence: Not At Risk  . Fear of Current or Ex-Partner: No  . Emotionally Abused: No  . Physically Abused: No  . Sexually Abused: No   No family history on file.    VITAL SIGNS BP 129/86   Pulse 80   Temp (!) 97.2 F (36.2 C) (Oral)   Resp 20   Ht 5' 8.5" (1.74 m)   Wt 230 lb 3.2 oz (104.4 kg)   SpO2 93%   BMI 34.49 kg/m   Outpatient Encounter Medications as of 05/31/2019  Medication Sig  . acetaminophen (TYLENOL) 325 MG tablet Take 650 mg by mouth every 6 (six) hours as needed for moderate pain.   Marland Kitchen apixaban (ELIQUIS) 5 MG TABS tablet Place 1 tablet (5 mg total) into feeding tube 2 (two) times daily.  Marland Kitchen atorvastatin (LIPITOR) 10 MG tablet Take 10 mg by mouth daily.  Marland Kitchen atropine 1 % ophthalmic solution Place 1 drop under the tongue every 6 (six) hours as needed.  . bisacodyl (DULCOLAX) 10 MG suppository Place 1 suppository (10 mg total) rectally at bedtime.  . budesonide-formoterol (SYMBICORT) 80-4.5 MCG/ACT inhaler Inhale 2 puffs into the lungs 2 (two) times daily.  . carboxymethylcellulose (REFRESH PLUS) 0.5 % SOLN Place 1 drop into the right eye 3 (three) times daily.   Marland Kitchen erythromycin ophthalmic ointment Place into the left eye. appy 1 ribbon ophthalmic (eye) 3 times a day  . Insulin Glargine (BASAGLAR KWIKPEN) 100 UNIT/ML SOPN Inject 12 Units into the skin at bedtime.  Marland Kitchen ipratropium-albuterol (DUONEB) 0.5-2.5 (3) MG/3ML SOLN Take 3 mLs by nebulization every 6 (six) hours as needed.  . labetalol (NORMODYNE) 200 MG tablet Take 200 mg by mouth daily. For HTN  . loratadine (CLARITIN) 10 MG tablet Take 10 mg by mouth daily.  . methimazole (TAPAZOLE) 5 MG tablet Take 5 mg by mouth daily.  . Multiple Vitamin (MULTIVITAMIN WITH MINERALS) TABS tablet Place 1 tablet into feeding tube daily.  . NON FORMULARY Diet: Regular, NAS, Consistent Carbohydrate  . Nutritional Supplements (FEEDING SUPPLEMENT, OSMOLITE 1.2 CAL,)  LIQD Place into feeding tube. Give at each feeding Four Times A Day  . OXYGEN Inhale 2 L into the lungs. Every shift  . sennosides (SENOKOT) 8.8 MG/5ML syrup Place 5 mLs into feeding tube 2 (two) times daily as needed for mild constipation.  Marland Kitchen umeclidinium bromide (INCRUSE ELLIPTA) 62.5 MCG/INH AEPB Inhale 1 puff into the lungs daily.  . vitamin C (ASCORBIC ACID) 500 MG tablet Take 500 mg by mouth daily.  . Water For Irrigation, Sterile (FREE WATER) SOLN Place 300 mLs into feeding tube. Every shift  . zinc sulfate 220 (50 Zn) MG capsule Take 220 mg by mouth daily.   No facility-administered encounter medications on file as of 05/31/2019.     SIGNIFICANT DIAGNOSTIC EXAMS   PREVIOUS;   01-16-19: chest x-ray: Infiltrate versus atelectasis at  LEFT base   05-05-19: chest x-ray: 1. Normal chest x-ray.  2. No tuberculosis is noted   05-08-19: KUB: Findings suggest constipation, fecal impaction or decreased colonic motility. Correlate with risk factors for constipation or fecal stasis, based on frequency or absence of bowel movements. Overall sensitivity is decreased, due to attenuation from patient body habitus.  Soft tissue attenuation results in significantly diminished diagnostic sensitivity and utility.  05-09-19: chest x-ray: Mild bibasilar subsegmental atelectasis or infiltrates are noted   05-09-19: ct of abdomen and pelvis:  1. Moderately large amount of stool in the distal sigmoid colon and rectum. Moderate gaseous distension of the more proximal sigmoid colon without evidence of volvulus or bowel obstruction. 2. Bibasilar lung consolidation concerning for pneumonia. 3. Aortic Atherosclerosis   07-12-18: chest x-ray: Cardiomegaly with low lung volumes and bibasilar collapse/consolidation, left greater than right.  05-16-19: chest x-ray: ETT in good position. Worsened bilateral airspace disease small pleural effusions.  05-19-19: chest x-ray: Partial but incomplete  clearing of opacity from the basis. Patchy airspace opacity remains in the lower lung regions with consolidation in a portion of the left base. There are small pleural effusions bilaterally. Upper lung regions appear clear. Heart upper normal in size. No adenopathy demonstrable by radiography.  NO NEW EXAMS     LABS REVIEWED: PREVIOUS   06-02-18: glucose 88; bun 25; creat 1.17; k+ 4.0; na++ 137; ca 8.7  07-28-18: tsh 0.299; free T3: 2.8; free T4: 0.92 08-17-18: INR 2.5  09-14-18: INR 2.7 10-05-18: INR 3.3 10-12-18: INR 1.9  10-19-18: INR 2.9 10-26-18: wbc 8.8; hgb 15.7; hct 49.2; mcv 95.7; plt 240; glucose 74; bun 25; creat 1.05; k+ 3.9; na++ 141; ca 8.9 INR 2.1  11-27-18: INR 2.7 tsh 0.051 free T3: 3.2; free T4: 1.16 12-11-18: INR 3.3 12-13-18: INR 2.7 12-20-18: INR 2.4 tsh 0.444 free T3: 2.4 free T4: 0.90 01-02-19: INR 2.9  01-09-19: INR 3.5 01-16-19: INR 4.4 01-18-19: INR 3.3  01-23-19: INR 1.4  02-05-19: wbc 9.9; hgb 15.3; hct 482; mcv 96.4; plt 236; glucose 99; bun 46; creat 1.54; k+ 4.0; na++ 139; ca 8.7; liver normal albumin 3.2; chol 106; ldl 62; trig 54; hdl 33; PSA 10.58 (scheduled for 03-31-19)  03-16-19: tsh 2.668 free T4: 0.81 05-01-19: PSA 11.82 05-05-19: wbc 12.3; hgb 16.7; hct 53.4; mcv 98.2 plt 246; blood culture: no growth 05-08-19: PSA 16.39 05-09-19: wbc 13.3; hgb 16.9; hct 56.5; mcv 102.7 plt 261; glucose 689; bun 82; creat 2.78; k+ 5.3; na++ 151; ca 8.9; urine culture: enterococcus faecalis:  Hgb a1c 11.3 05-11-19: glucose 98; bun 36; creat 1.61; k+ 3.8; na++ 158; ca 7.8; phos 2.0 albumin 2.6 05-15-19: wbc 8.2; hgb 12.9; hct 42.2; mcv 100.2 plt 165; glucose 178; bun 15; creat 1.37; k+ 3.5; na++ 143; ca 7.5  05-18-19: wbc 7.8; hgb 13.3; hct 43.2; mcv 99.1 plt 227; glucose 246; bun 28; creat 1.21; k+ 3.2; na++ 139 ca 7.7 05-20-19: wbc 9.5; hgb 13.3; hct 44.0; mcv 98.9 plt 264; glucose 190; bun 25; creat 1.10; k+ 3.7; na++ 138; ca 8.3 ;liver normal albumin 2.4   NO NEW LABS.      Review of Systems  Constitutional: Negative for malaise/fatigue.  Respiratory: Negative for cough and shortness of breath.   Cardiovascular: Negative for chest pain, palpitations and leg swelling.  Gastrointestinal: Negative for abdominal pain, constipation and heartburn.  Musculoskeletal: Negative for back pain, joint pain and myalgias.  Skin: Negative.   Neurological: Negative for dizziness.  Psychiatric/Behavioral: The patient  is not nervous/anxious.      Physical Exam Constitutional:      General: He is not in acute distress.    Appearance: He is well-developed. He is obese. He is not diaphoretic.  Neck:     Thyroid: No thyromegaly.  Cardiovascular:     Rate and Rhythm: Normal rate and regular rhythm.     Pulses: Normal pulses.     Heart sounds: Normal heart sounds.  Pulmonary:     Effort: Pulmonary effort is normal. No respiratory distress.     Breath sounds: Normal breath sounds.     Comments: 02 dependent  Abdominal:     General: Bowel sounds are normal. There is no distension.     Palpations: Abdomen is soft.     Tenderness: There is no abdominal tenderness.     Comments: Peg tube present   Musculoskeletal:     Cervical back: Neck supple.     Right lower leg: No edema.     Left lower leg: No edema.     Comments: Right hemiplegia Is able to move left extremities          Lymphadenopathy:     Cervical: No cervical adenopathy.  Skin:    General: Skin is warm and dry.  Neurological:     Mental Status: He is alert. Mental status is at baseline.  Psychiatric:        Mood and Affect: Mood normal.     ASSESSMENT/ PLAN:  TODAY:  1. Simple chronic bronchitis: is stable 02 dependent will continue symbicort 80/4.5 mcg 2 puffs twice daily claritin 10 mg daily incruse ellipta 62.5 mcg 1 puff daily   2.  Dysphagia due to old cerebrovascular accident on honey thick liquids/increased oral secretions/new peg tube: is NPO will continue bolus feedings has atropine drops as  needed for increased oral secretions.   3. Essential hypertension: is stable b/p 129/86 will continue labetalol 200 mg daily   PREVIOUS    4. 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 Dyslipidemia: is stable LDL 54 will continue lipitor 10 gm daily   7. Hyperthyroidism: is stable tsh 0.051 free t3: 3.2 free t4: 1.16will continue tapazole 5 mg daily   8. Deep vein thrombosis (DVT) of left extremity unspecified/other chronic pulmonary embolism without acute cor pulmonale: is stable is on long term eliquis 5 mg twice daily   9. Type 2 diabetes mellitus new onset with long term current use of insulin: hgb a1c 11.3  is without change in status will continue basaglar 12 units nightly and will monitor his status.       MD is aware of resident's narcotic use and is in agreement with current plan of care. We will attempt to wean resident as appropriate.  Ok Edwards NP [facility]  Contact [phone] Monday through Friday 8am- 5pm  After hours call [phone]

## 2019-06-01 ENCOUNTER — Ambulatory Visit: Payer: Medicare Other | Admitting: "Endocrinology

## 2019-06-01 LAB — SARS CORONAVIRUS 2 (TAT 6-24 HRS): SARS Coronavirus 2: NEGATIVE

## 2019-06-02 ENCOUNTER — Other Ambulatory Visit (HOSPITAL_COMMUNITY)
Admission: RE | Admit: 2019-06-02 | Discharge: 2019-06-02 | Disposition: A | Discharge status: Home / Self Care | Payer: Medicare Other | Source: Ambulatory Visit | Attending: Internal Medicine | Admitting: Internal Medicine

## 2019-06-02 ENCOUNTER — Other Ambulatory Visit: Payer: Self-pay | Admitting: Internal Medicine

## 2019-06-02 ENCOUNTER — Telehealth: Payer: Self-pay | Admitting: Nurse Practitioner

## 2019-06-02 DIAGNOSIS — Z20828 Contact with and (suspected) exposure to other viral communicable diseases: Secondary | ICD-10-CM | POA: Insufficient documentation

## 2019-06-02 DIAGNOSIS — Z9189 Other specified personal risk factors, not elsewhere classified: Secondary | ICD-10-CM

## 2019-06-02 NOTE — Telephone Encounter (Signed)
Called and reported the patient's elevated CBG 525 dinner, additional 7 u Humalog ordered, will call back of VS.

## 2019-06-03 LAB — SARS CORONAVIRUS 2 (TAT 6-24 HRS): SARS Coronavirus 2: NEGATIVE

## 2019-06-05 ENCOUNTER — Encounter: Payer: Self-pay | Admitting: Adult Health

## 2019-06-05 ENCOUNTER — Non-Acute Institutional Stay (SKILLED_NURSING_FACILITY): Payer: Medicare Other | Admitting: Adult Health

## 2019-06-05 DIAGNOSIS — Z794 Long term (current) use of insulin: Secondary | ICD-10-CM | POA: Diagnosis not present

## 2019-06-05 DIAGNOSIS — E1149 Type 2 diabetes mellitus with other diabetic neurological complication: Secondary | ICD-10-CM | POA: Diagnosis not present

## 2019-06-05 DIAGNOSIS — E1165 Type 2 diabetes mellitus with hyperglycemia: Secondary | ICD-10-CM

## 2019-06-05 DIAGNOSIS — IMO0002 Reserved for concepts with insufficient information to code with codable children: Secondary | ICD-10-CM

## 2019-06-05 NOTE — Progress Notes (Signed)
Location:   [facility] Room Number: 112/W Place of Service:  SNF (31)   CODE STATUS: full code   Allergies  Allergen Reactions  . Penicillins     Has patient had a PCN reaction causing immediate rash, facial/tongue/throat swelling, SOB or lightheadedness with hypotension: unknown Has patient had a PCN reaction causing severe rash involving mucus membranes or skin necrosis: unknown Has patient had a PCN reaction that required hospitalization: unknown Has patient had a PCN reaction occurring within the last 10 years: unknown If all of the above answers are "NO", then may proceed with Cephalosporin use. 5/3 Tolerated Cefepime x 1 dose in ED.     Chief Complaint  Patient presents with  . Acute Visit    Diabetes    HPI:  His cbg reading remain elevated from 200-400. He remains on tube feeding four times daily. He does receive oral care every shift. There are no reports of uncontrolled pain. No reports of aspiration.   Past Medical History:  Diagnosis Date  . Dysphasia   . Fatty liver   . Hemiplegia (HCC)   . Hypertension   . Pneumonia   . Pulmonary embolism (HCC)   . 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(HCC)   . Stroke [facility])     Past Surgical History:  Procedure Laterality Date  . ESOPHAGOGASTRODUODENOSCOPY (EGD) WITH PROPOFOL N/A 05/15/2019   Procedure: ESOPHAGOGASTRODUODENOSCOPY (EGD) WITH PROPOFOL;  Surgeon: Lucretia Roers, MD;  Location: [facility] ORS;  Service: General;  Laterality: N/A;  . FLEXIBLE SIGMOIDOSCOPY N/A 10/21/2016   Procedure: FLEXIBLE SIGMOIDOSCOPY;  Surgeon: Malissa Hippo, MD;  Location: [facility] ENDO SUITE;  Service: Endoscopy;  Laterality: N/A;  . FLEXIBLE SIGMOIDOSCOPY N/A 10/25/2016   Procedure: FLEXIBLE SIGMOIDOSCOPY;  Surgeon: Malissa Hippo, MD;  Location: [facility] END[facility] SU[facility];  Service: Endoscopy;  Laterality: N/A;  . PEG PLACEMENT N/A 05/15/2019   Procedure: PERCUTANEOUS ENDOSCOPIC GASTROSTOMY (PEG) PLACEMENT;  Surgeon: Lucretia Roers, MD;  Location: [facility]  ORS;  Service: General;  Laterality: N/A;  . unable      Social History   Socioeconomic History  . Marital status: Single    Spouse name: Not on file  . Number of children: Not on file  . Years of education: Not on file  . Highest education level: Not on file  Occupational History  . Not on file  Tobacco Use  . Smoking status: Never Smoker  . Smokeless tobacco: Never Used  Substance and Sexual Activity  . Alcohol use: No    Alcohol/week: 0.0 standard drinks  . Drug use: No  . Sexual activity: Not on file  Other Topics Concern  . Not on file  Social History Narrative  . Not on file   Social Determinants of Health   Financial Resource Strain:   . Difficulty of Paying Living Expenses: Not on file  Food Insecurity:   . Worried About Programme researcher, broadcasting/film/video in the Last Year: Not on file  . Ran Out of Food in the Last Year: Not on file  Transportation Needs:   . Lack of Transportation (Medical): Not on file  . Lack of Transportation (Non-Medical): Not on file  Physical Activity:   . Days of Exercise per Week: Not on file  . Minutes of Exercise per Session: Not on file  Stress:   . Feeling of Stress : Not on file  Social Connections:   . Frequency of Communication with Friends and Family: Not on file  . Frequency of Social Gatherings  with Friends and Family: Not on file  . Attends Religious Services: Not on file  . Active Member of Clubs or Organizations: Not on file  . Attends BankerClub or Organization Meetings: Not on file  . Marital Status: Not on file  Intimate Partner Violence:   . Fear of Current or Ex-Partner: Not on file  . Emotionally Abused: Not on file  . Physically Abused: Not on file  . Sexually Abused: Not on file   No family history on file.    VITAL SIGNS BP 127/77   Pulse 93   Temp 99 F (37.2 C) (Oral)   Resp (!) 22   Ht 5' 8.5" (1.74 m)   Wt 230 lb 3.2 oz (104.4 kg)   SpO2 95%   BMI 34.49 kg/m   Outpatient Encounter Medications as of  06/05/2019  Medication Sig  . acetaminophen (TYLENOL) 325 MG tablet Take 650 mg by mouth every 6 (six) hours as needed for moderate pain.   Marland Kitchen. antiseptic oral rinse (BIOTENE) LIQD 15 mLs by Mouth Rinse route. Special Instructions: clean mouth with swab Every 6 Hours  . apixaban (ELIQUIS) 5 MG TABS tablet Place 1 tablet (5 mg total) into feeding tube 2 (two) times daily.  Marland Kitchen. atorvastatin (LIPITOR) 10 MG tablet Take 10 mg by mouth daily.  Marland Kitchen. atropine 1 % ophthalmic solution Place 1 drop under the tongue every 6 (six) hours as needed.  . bisacodyl (DULCOLAX) 10 MG suppository Place 1 suppository (10 mg total) rectally at bedtime.  . budesonide-formoterol (SYMBICORT) 80-4.5 MCG/ACT inhaler Inhale 2 puffs into the lungs 2 (two) times daily.  . carboxymethylcellulose (REFRESH PLUS) 0.5 % SOLN Place 1 drop into the right eye 3 (three) times daily.   Marland Kitchen. erythromycin ophthalmic ointment Place into the left eye. appy 1 ribbon ophthalmic (eye) 3 times a day  . Insulin Glargine (BASAGLAR KWIKPEN) 100 UNIT/ML SOPN Inject 20 Units into the skin at bedtime.   . insulin lispro (HUMALOG KWIKPEN) 100 UNIT/ML KwikPen Inject 10 Units into the skin 2 (two) times daily. Give with Lunch and Dinner  . insulin lispro (HUMALOG KWIKPEN) 100 UNIT/ML KwikPen Inject 7 Units into the skin. With breakfast  . ipratropium-albuterol (DUONEB) 0.5-2.5 (3) MG/3ML SOLN Take 3 mLs by nebulization every 6 (six) hours as needed.  . labetalol (NORMODYNE) 200 MG tablet Take 200 mg by mouth daily. For HTN  . loratadine (CLARITIN) 10 MG tablet Take 10 mg by mouth daily.  . methimazole (TAPAZOLE) 5 MG tablet Take 5 mg by mouth daily.  . Multiple Vitamin (MULTIVITAMIN WITH MINERALS) TABS tablet Place 1 tablet into feeding tube daily.  . NON FORMULARY Diet: Regular, NAS, Consistent Carbohydrate  . OXYGEN Inhale 2 L into the lungs. Every shift  . sennosides (SENOKOT) 8.8 MG/5ML syrup Place 5 mLs into feeding tube 2 (two) times daily as needed for  mild constipation.  Marland Kitchen. umeclidinium bromide (INCRUSE ELLIPTA) 62.5 MCG/INH AEPB Inhale 1 puff into the lungs daily.  . vitamin C (ASCORBIC ACID) 500 MG tablet Take 500 mg by mouth daily.  . Water For Irrigation, Sterile (FREE WATER) SOLN Place 300 mLs into feeding tube. Every shift  . zinc sulfate 220 (50 Zn) MG capsule Take 220 mg by mouth daily.  . [DISCONTINUED] Nutritional Supplements (FEEDING SUPPLEMENT, OSMOLITE 1.2 CAL,) LIQD Place into feeding tube. Give 420ml at each feeding Four Times A Day   No facility-administered encounter medications on file as of 06/05/2019.     SIGNIFICANT DIAGNOSTIC EXAMS  PREVIOUS;   01-16-19: chest x-ray: Infiltrate versus atelectasis at LEFT base   05-05-19: chest x-ray: 1. Normal chest x-ray.  2. No tuberculosis is noted   05-08-19: KUB: Findings suggest constipation, fecal impaction or decreased colonic motility. Correlate with risk factors for constipation or fecal stasis, based on frequency or absence of bowel movements. Overall sensitivity is decreased, due to attenuation from patient body habitus.  Soft tissue attenuation results in significantly diminished diagnostic sensitivity and utility.  05-09-19: chest x-ray: Mild bibasilar subsegmental atelectasis or infiltrates are noted   05-09-19: ct of abdomen and pelvis:  1. Moderately large amount of stool in the distal sigmoid colon and rectum. Moderate gaseous distension of the more proximal sigmoid colon without evidence of volvulus or bowel obstruction. 2. Bibasilar lung consolidation concerning for pneumonia. 3. Aortic Atherosclerosis   07-12-18: chest x-ray: Cardiomegaly with low lung volumes and bibasilar collapse/consolidation, left greater than right.  05-16-19: chest x-ray: ETT in good position. Worsened bilateral airspace disease small pleural effusions.  05-19-19: chest x-ray: Partial but incomplete clearing of opacity from the basis. Patchy airspace opacity remains in the  lower lung regions with consolidation in a portion of the left base. There are small pleural effusions bilaterally. Upper lung regions appear clear. Heart upper normal in size. No adenopathy demonstrable by radiography.  NO NEW EXAMS     LABS REVIEWED: PREVIOUS   06-02-18: glucose 88; bun 25; creat 1.17; k+ 4.0; na++ 137; ca 8.7  07-28-18: tsh 0.299; free T3: 2.8; free T4: 0.92 08-17-18: INR 2.5  09-14-18: INR 2.7 10-05-18: INR 3.3 10-12-18: INR 1.9  10-19-18: INR 2.9 10-26-18: wbc 8.8; hgb 15.7; hct 49.2; mcv 95.7; plt 240; glucose 74; bun 25; creat 1.05; k+ 3.9; na++ 141; ca 8.9 INR 2.1  11-27-18: INR 2.7 tsh 0.051 free T3: 3.2; free T4: 1.16 12-11-18: INR 3.3 12-13-18: INR 2.7 12-20-18: INR 2.4 tsh 0.444 free T3: 2.4 free T4: 0.90 01-02-19: INR 2.9  01-09-19: INR 3.5 01-16-19: INR 4.4 01-18-19: INR 3.3  01-23-19: INR 1.4  02-05-19: wbc 9.9; hgb 15.3; hct 482; mcv 96.4; plt 236; glucose 99; bun 46; creat 1.54; k+ 4.0; na++ 139; ca 8.7; liver normal albumin 3.2; chol 106; ldl 62; trig 54; hdl 33; PSA 10.58 (scheduled for 03-31-19)  03-16-19: tsh 2.668 free T4: 0.81 05-01-19: PSA 11.82 05-05-19: wbc 12.3; hgb 16.7; hct 53.4; mcv 98.2 plt 246; blood culture: no growth 05-08-19: PSA 16.39 05-09-19: wbc 13.3; hgb 16.9; hct 56.5; mcv 102.7 plt 261; glucose 689; bun 82; creat 2.78; k+ 5.3; na++ 151; ca 8.9; urine culture: enterococcus faecalis:  Hgb a1c 11.3 05-11-19: glucose 98; bun 36; creat 1.61; k+ 3.8; na++ 158; ca 7.8; phos 2.0 albumin 2.6 05-15-19: wbc 8.2; hgb 12.9; hct 42.2; mcv 100.2 plt 165; glucose 178; bun 15; creat 1.37; k+ 3.5; na++ 143; ca 7.5  05-18-19: wbc 7.8; hgb 13.3; hct 43.2; mcv 99.1 plt 227; glucose 246; bun 28; creat 1.21; k+ 3.2; na++ 139 ca 7.7 05-20-19: wbc 9.5; hgb 13.3; hct 44.0; mcv 98.9 plt 264; glucose 190; bun 25; creat 1.10; k+ 3.7; na++ 138; ca 8.3 ;liver normal albumin 2.4   NO NEW LABS.    Review of Systems  Constitutional: Negative for malaise/fatigue.    Respiratory: Negative for cough and shortness of breath.   Cardiovascular: Negative for chest pain, palpitations and leg swelling.  Gastrointestinal: Negative for abdominal pain, constipation and heartburn.  Musculoskeletal: Negative for back pain, joint pain and myalgias.  Skin: Negative.  Neurological: Negative for dizziness.  Psychiatric/Behavioral: The patient is not nervous/anxious.     Physical Exam Constitutional:      General: He is not in acute distress.    Appearance: He is well-developed. He is obese. He is not diaphoretic.  Neck:     Thyroid: No thyromegaly.  Cardiovascular:     Rate and Rhythm: Normal rate and regular rhythm.     Pulses: Normal pulses.     Heart sounds: Normal heart sounds.  Pulmonary:     Effort: Pulmonary effort is normal. No respiratory distress.     Breath sounds: Normal breath sounds.     Comments: 02 dependent  Abdominal:     General: Bowel sounds are normal. There is no distension.     Palpations: Abdomen is soft.     Tenderness: There is no abdominal tenderness.     Comments: Peg tube present   Musculoskeletal:     Cervical back: Neck supple.     Right lower leg: No edema.     Left lower leg: No edema.     Comments: Right hemiplegia Is able to move left extremities     Lymphadenopathy:     Cervical: No cervical adenopathy.  Skin:    General: Skin is warm and dry.  Neurological:     Mental Status: He is alert. Mental status is at baseline.  Psychiatric:        Mood and Affect: Mood normal.     ASSESSMENT/ PLAN:  TODAY  1. 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   MD is aware of resident's narcotic use and is in agreement with current plan of care. We will attempt to wean resident as appropriate.  Synthia Innocent NP [facility]   Contact [phone] Monday through Friday 8am- 5pm  After hours call [phone]

## 2019-06-06 ENCOUNTER — Encounter: Payer: Self-pay | Admitting: Internal Medicine

## 2019-06-06 DIAGNOSIS — K219 Gastro-esophageal reflux disease without esophagitis: Secondary | ICD-10-CM | POA: Insufficient documentation

## 2019-06-08 ENCOUNTER — Non-Acute Institutional Stay (SKILLED_NURSING_FACILITY): Payer: Medicare Other | Admitting: Adult Health

## 2019-06-08 ENCOUNTER — Encounter: Payer: Self-pay | Admitting: Adult Health

## 2019-06-08 DIAGNOSIS — I639 Cerebral infarction, unspecified: Secondary | ICD-10-CM | POA: Diagnosis not present

## 2019-06-08 DIAGNOSIS — I679 Cerebrovascular disease, unspecified: Secondary | ICD-10-CM | POA: Diagnosis not present

## 2019-06-08 DIAGNOSIS — E0590 Thyrotoxicosis, unspecified without thyrotoxic crisis or storm: Secondary | ICD-10-CM

## 2019-06-08 DIAGNOSIS — E785 Hyperlipidemia, unspecified: Secondary | ICD-10-CM | POA: Diagnosis not present

## 2019-06-08 DIAGNOSIS — G8191 Hemiplegia, unspecified affecting right dominant side: Secondary | ICD-10-CM

## 2019-06-08 LAB — NOVEL CORONAVIRUS, NAA (HOSP ORDER, SEND-OUT TO REF LAB; TAT 18-24 HRS): SARS-CoV-2, NAA: NOT DETECTED

## 2019-06-08 NOTE — Progress Notes (Signed)
Location:    [place] Room Number: 112/W Place of Service:  SNF (31)   CODE STATUS: Full Code  Allergies  Allergen Reactions  . Penicillins     Has patient had a PCN reaction causing immediate rash, facial/tongue/throat swelling, SOB or lightheadedness with hypotension: unknown Has patient had a PCN reaction causing severe rash involving mucus membranes or skin necrosis: unknown Has patient had a PCN reaction that required hospitalization: unknown Has patient had a PCN reaction occurring within the last 10 years: unknown If all of the above answers are "NO", then may proceed with Cephalosporin use. 5/3 Tolerated Cefepime x 1 dose in ED.    Chief Complaint  Patient presents with  . Medical Management of Chronic Issues        Cerebrovascular accident (CVA) due to other mechanism/hemiplegia or right dominant side due to cerebrovascular disease:  Dyslipidemia:  Hyperthyroidism:  Weekly follow up for the first 30 days post hospitalization.      HPI:  He is a 73 year old long term resident of this facility being seen for the management of his chronic illnesses: cva; dyslipidemia; hyperthyroidism. He is wanting to eat; and not use feeding tube. There are no reports of uncontrolled pain; no reports of agitation or insomnia.   Past Medical History:  Diagnosis Date  . Dysphasia   . Fatty liver   . Hemiplegia ([place])   . Hypertension   . Pneumonia   . Pulmonary embolism ([place])   . 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([place])   . Stroke [facility])     Past Surgical History:  Procedure Laterality Date  . ESOPHAGOGASTRODUODENOSCOPY (EGD) WITH PROPOFOL N/A 05/15/2019   Procedure: ESOPHAGOGASTRODUODENOSCOPY (EGD) WITH PROPOFOL;  Surgeon: Virl Cagey, MD;  Location: [facility] ORS;  Service: General;  Laterality: N/A;  . FLEXIBLE SIGMOIDOSCOPY N/A 10/21/2016   Procedure: FLEXIBLE SIGMOIDOSCOPY;  Surgeon: Rogene Houston, MD;  Location: [facility] ENDO SUITE;  Service: Endoscopy;  Laterality: N/A;  .  FLEXIBLE SIGMOIDOSCOPY N/A 10/25/2016   Procedure: FLEXIBLE SIGMOIDOSCOPY;  Surgeon: Rogene Houston, MD;  Location: [facility] END[facility] SUITE;  Service: Endoscopy;  Laterality: N/A;  . PEG PLACEMENT N/A 05/15/2019   Procedure: PERCUTANEOUS ENDOSCOPIC GASTROSTOMY (PEG) PLACEMENT;  Surgeon: Virl Cagey, MD;  Location: [facility] ORS;  Service: General;  Laterality: N/A;  . unable      Social History   Socioeconomic History  . Marital status: Single    Spouse name: Not on file  . Number of children: Not on file  . Years of education: Not on file  . Highest education level: Not on file  Occupational History  . Not on file  Tobacco Use  . Smoking status: Never Smoker  . Smokeless tobacco: Never Used  Substance and Sexual Activity  . Alcohol use: No    Alcohol/week: 0.0 standard drinks  . Drug use: No  . Sexual activity: Not on file  Other Topics Concern  . Not on file  Social History Narrative  . Not on file   Social Determinants of Health   Financial Resource Strain:   . Difficulty of Paying Living Expenses: Not on file  Food Insecurity:   . Worried About Charity fundraiser in the Last Year: Not on file  . Ran Out of Food in the Last Year: Not on file  Transportation Needs:   . Lack of Transportation (Medical): Not on file  . Lack of Transportation (Non-Medical): Not on file  Physical Activity:   . Days  of Exercise per Week: Not on file  . Minutes of Exercise per Session: Not on file  Stress:   . Feeling of Stress : Not on file  Social Connections:   . Frequency of Communication with Friends and Family: Not on file  . Frequency of Social Gatherings with Friends and Family: Not on file  . Attends Religious Services: Not on file  . Active Member of Clubs or Organizations: Not on file  . Attends Club or Organization Meetings: Not on file  . Marital Status: Not on file  IntiBankerence:   . Fear of Current or Ex-Partner: Not on file  . Emotionally Abused: Not on file  .  Physically Abused: Not on file  . Sexually Abused: Not on file   No family history on file.    VITAL SIGNS BP 128/82   Pulse 83   Temp 98.4 F (36.9 C) (Oral)   Resp (!) 22   Ht 5' 8.5" (1.74 m)   Wt 230 lb 3.2 oz (104.4 kg)   SpO2 97%   BMI 34.49 kg/m   Outpatient Encounter Medications as of 06/08/2019  Medication Sig  . acetaminophen (TYLENOL) 325 MG tablet Take 650 mg by mouth every 6 (six) hours as needed for moderate pain.   Marland Kitchen antiseptic oral rinse (BIOTENE) LIQD 15 mLs by Mouth Rinse route. Special Instructions: clean mouth with swab Every 6 Hours  . apixaban (ELIQUIS) 5 MG TABS tablet Place 1 tablet (5 mg total) into feeding tube 2 (two) times daily.  Marland Kitchen atorvastatin (LIPITOR) 10 MG tablet Take 10 mg by mouth daily.  Marland Kitchen atropine 1 % ophthalmic solution Place 1 drop under the tongue every 6 (six) hours as needed.  . bisacodyl (DULCOLAX) 10 MG suppository Place 1 suppository (10 mg total) rectally at bedtime.  . budesonide-formoterol (SYMBICORT) 80-4.5 MCG/ACT inhaler Inhale 2 puffs into the lungs 2 (two) times daily.  . carboxymethylcellulose (REFRESH PLUS) 0.5 % SOLN Place 1 drop into the right eye 3 (three) times daily.   Marland Kitchen erythromycin ophthalmic ointment Place into the left eye. appy 1 ribbon ophthalmic (eye) 3 times a day  . Insulin Glargine (BASAGLAR KWIKPEN) 100 UNIT/ML SOPN Inject 25 Units into the skin at bedtime.   . insulin lispro (HUMALOG KWIKPEN) 100 UNIT/ML KwikPen Inject 15 Units into the skin 4 (four) times daily. With tube feedings  . ipratropium-albuterol (DUONEB) 0.5-2.5 (3) MG/3ML SOLN Take 3 mLs by nebulization every 6 (six) hours as needed.  . labetalol (NORMODYNE) 200 MG tablet Take 200 mg by mouth daily. For HTN  . loratadine (CLARITIN) 10 MG tablet Take 10 mg by mouth daily.  . methimazole (TAPAZOLE) 5 MG tablet Take 5 mg by mouth daily.  . Multiple Vitamins-Minerals (ALIVE MULTI-VITAMIN) LIQD Amt: 15cc; gastric tube  Once A Day 09:00 AM  . NON  FORMULARY Bolus feedings-Osmolite 1.2 via PEG-QID Special Instructions: Give at each feeding Four Times A Day 09:00 AM, 12:00 PM, 05:00 PM, 09:00 PM  . OXYGEN Inhale 2 L into the lungs. Every shift  . sennosides (SENOKOT) 8.8 MG/5ML syrup Place 5 mLs into feeding tube 2 (two) times daily as needed for mild constipation.  Marland Kitchen umeclidinium bromide (INCRUSE ELLIPTA) 62.5 MCG/INH AEPB Inhale 1 puff into the lungs daily.  . vitamin C (ASCORBIC ACID) 500 MG tablet Take 500 mg by mouth daily.  . Water For Irrigation, Sterile (FREE WATER) SOLN Place 300 mLs into feeding tube. Every shift  . zinc sulfate 220 (50  Zn) MG capsule Take 220 mg by mouth daily.  . [DISCONTINUED] insulin lispro (HUMALOG KWIKPEN) 100 UNIT/ML KwikPen Inject 7 Units into the skin. With breakfast  . [DISCONTINUED] Multiple Vitamin (MULTIVITAMIN WITH MINERALS) TABS tablet Place 1 tablet into feeding tube daily.   No facility-administered encounter medications on file as of 06/08/2019.     SIGNIFICANT DIAGNOSTIC EXAMS  PREVIOUS;   01-16-19: chest x-ray: Infiltrate versus atelectasis at LEFT base   05-05-19: chest x-ray: 1. Normal chest x-ray.  2. No tuberculosis is noted   05-08-19: KUB: Findings suggest constipation, fecal impaction or decreased colonic motility. Correlate with risk factors for constipation or fecal stasis, based on frequency or absence of bowel movements. Overall sensitivity is decreased, due to attenuation from patient body habitus.  Soft tissue attenuation results in significantly diminished diagnostic sensitivity and utility.  05-09-19: chest x-ray: Mild bibasilar subsegmental atelectasis or infiltrates are noted   05-09-19: ct of abdomen and pelvis:  1. Moderately large amount of stool in the distal sigmoid colon and rectum. Moderate gaseous distension of the more proximal sigmoid colon without evidence of volvulus or bowel obstruction. 2. Bibasilar lung consolidation concerning for  pneumonia. 3. Aortic Atherosclerosis   07-12-18: chest x-ray: Cardiomegaly with low lung volumes and bibasilar collapse/consolidation, left greater than right.  05-16-19: chest x-ray: ETT in good position. Worsened bilateral airspace disease small pleural effusions.  05-19-19: chest x-ray: Partial but incomplete clearing of opacity from the basis. Patchy airspace opacity remains in the lower lung regions with consolidation in a portion of the left base. There are small pleural effusions bilaterally. Upper lung regions appear clear. Heart upper normal in size. No adenopathy demonstrable by radiography.  NO NEW EXAMS     LABS REVIEWED: PREVIOUS   07-28-18: tsh 0.299; free T3: 2.8; free T4: 0.92 08-17-18: INR 2.5  09-14-18: INR 2.7 10-05-18: INR 3.3 10-12-18: INR 1.9  10-19-18: INR 2.9 10-26-18: wbc 8.8; hgb 15.7; hct 49.2; mcv 95.7; plt 240; glucose 74; bun 25; creat 1.05; k+ 3.9; na++ 141; ca 8.9 INR 2.1  11-27-18: INR 2.7 tsh 0.051 free T3: 3.2; free T4: 1.16 12-11-18: INR 3.3 12-13-18: INR 2.7 12-20-18: INR 2.4 tsh 0.444 free T3: 2.4 free T4: 0.90 01-02-19: INR 2.9  01-09-19: INR 3.5 01-16-19: INR 4.4 01-18-19: INR 3.3  01-23-19: INR 1.4  02-05-19: wbc 9.9; hgb 15.3; hct 482; mcv 96.4; plt 236; glucose 99; bun 46; creat 1.54; k+ 4.0; na++ 139; ca 8.7; liver normal albumin 3.2; chol 106; ldl 62; trig 54; hdl 33; PSA 10.58 (scheduled for 03-31-19)  03-16-19: tsh 2.668 free T4: 0.81 05-01-19: PSA 11.82 05-05-19: wbc 12.3; hgb 16.7; hct 53.4; mcv 98.2 plt 246; blood culture: no growth 05-08-19: PSA 16.39 05-09-19: wbc 13.3; hgb 16.9; hct 56.5; mcv 102.7 plt 261; glucose 689; bun 82; creat 2.78; k+ 5.3; na++ 151; ca 8.9; urine culture: enterococcus faecalis:  Hgb a1c 11.3 05-11-19: glucose 98; bun 36; creat 1.61; k+ 3.8; na++ 158; ca 7.8; phos 2.0 albumin 2.6 05-15-19: wbc 8.2; hgb 12.9; hct 42.2; mcv 100.2 plt 165; glucose 178; bun 15; creat 1.37; k+ 3.5; na++ 143; ca 7.5  05-18-19: wbc 7.8; hgb 13.3;  hct 43.2; mcv 99.1 plt 227; glucose 246; bun 28; creat 1.21; k+ 3.2; na++ 139 ca 7.7 05-20-19: wbc 9.5; hgb 13.3; hct 44.0; mcv 98.9 plt 264; glucose 190; bun 25; creat 1.10; k+ 3.7; na++ 138; ca 8.3 ;liver normal albumin 2.4   NO NEW LABS.     Review  of Systems  Constitutional: Negative for malaise/fatigue.  Respiratory: Negative for cough and shortness of breath.   Cardiovascular: Negative for chest pain, palpitations and leg swelling.  Gastrointestinal: Negative for abdominal pain, constipation and heartburn.  Musculoskeletal: Negative for back pain, joint pain and myalgias.  Skin: Negative.   Neurological: Negative for dizziness.  Psychiatric/Behavioral: The patient is not nervous/anxious.       Physical Exam Constitutional:      General: He is not in acute distress.    Appearance: He is well-developed. He is obese. He is not diaphoretic.  Neck:     Thyroid: No thyromegaly.  Cardiovascular:     Rate and Rhythm: Normal rate and regular rhythm.     Heart sounds: Normal heart sounds.  Pulmonary:     Effort: Pulmonary effort is normal. No respiratory distress.     Breath sounds: Normal breath sounds.     Comments: 02 dependent  Abdominal:     General: Bowel sounds are normal. There is no distension.     Palpations: Abdomen is soft.     Tenderness: There is no abdominal tenderness.     Comments: Peg tube present   Musculoskeletal:     Cervical back: Neck supple.     Right lower leg: No edema.     Left lower leg: No edema.     Comments: Right hemiplegia Is able to move left extremities      Lymphadenopathy:     Cervical: No cervical adenopathy.  Skin:    General: Skin is warm and dry.  Neurological:     Mental Status: He is alert. Mental status is at baseline.  Psychiatric:        Mood and Affect: Mood normal.     ASSESSMENT/ PLAN:  TODAY:  1. Cerebrovascular accident (CVA) due to other mechanism/hemiplegia or right dominant side due to cerebrovascular disease:  is stable will continue eilqius 5 mg twice daily   2. Dyslipidemia: is stable LDL 54 will continue lipitor 10 mg daily   3. Hyperthyroidism: is stable tsh 0.051 free T3: 3.2 free T4: 1.16; will continue tapazole 5 mg daily   PREVIOUS   4. 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 Deep vein thrombosis (DVT) of left extremity unspecified/other chronic pulmonary embolism without acute cor pulmonale: is stable is on long term eliquis 5 mg twice daily   6. Type 2 diabetes mellitus new onset with long term current use of insulin: hgb a1c 11.3  is without change in status will continue basaglar 25  units nightly humalog 15 units with feedings. and will monitor his status.   7. 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   9. Essential hypertension: is stable b/p 128/82 will continue labetalol 200 mg daily   Will setup care plan meeting to discuss tube feeding.   MD is aware of resident's narcotic use and is in agreement with current plan of care. We will attempt to wean resident as appropriate.  Synthia Innocenteborah Perla Echavarria NP [facility]  Contact [phone] Monday through Friday 8am- 5pm  After hours call [phone]

## 2019-06-11 ENCOUNTER — Encounter: Payer: Self-pay | Admitting: Adult Health

## 2019-06-11 ENCOUNTER — Non-Acute Institutional Stay (SKILLED_NURSING_FACILITY): Payer: Medicare Other | Admitting: Adult Health

## 2019-06-11 DIAGNOSIS — J410 Simple chronic bronchitis: Secondary | ICD-10-CM

## 2019-06-11 DIAGNOSIS — I69391 Dysphagia following cerebral infarction: Secondary | ICD-10-CM

## 2019-06-11 DIAGNOSIS — I639 Cerebral infarction, unspecified: Secondary | ICD-10-CM | POA: Diagnosis not present

## 2019-06-11 NOTE — Progress Notes (Signed)
Location:    [facility] Room Number: 112/W Place of Service:  SNF (31)   CODE STATUS: Full Code  Allergies  Allergen Reactions  . Penicillins     Has patient had a PCN reaction causing immediate rash, facial/tongue/throat swelling, SOB or lightheadedness with hypotension: unknown Has patient had a PCN reaction causing severe rash involving mucus membranes or skin necrosis: unknown Has patient had a PCN reaction that required hospitalization: unknown Has patient had a PCN reaction occurring within the last 10 years: unknown If all of the above answers are "NO", then may proceed with Cephalosporin use. 5/3 Tolerated Cefepime x 1 dose in ED.     Chief Complaint  Patient presents with  . Acute Visit    Care PLan Meeting    HPI:  We have come together for his care plan meeting. He is wanting to have his diet changed to po. He does not want to continue tube feedings and would like to have the peg tube removed. We did explain to him that we could not get the tube removed until after the covid restriction was removed. He does understand that he can aspirate with everything he eats. He does understand that he can die from this. He wants to continue being a full code. He denies any uncontrolled pain; denies any anxiety. He continues to have upper airway congestion.   Past Medical History:  Diagnosis Date  . Dysphasia   . Fatty liver   . Hemiplegia (HCC)   . Hypertension   . Pneumonia   . Pulmonary embolism (HCC)   . 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(HCC)   . Stroke [facility])     Past Surgical History:  Procedure Laterality Date  . ESOPHAGOGASTRODUODENOSCOPY (EGD) WITH PROPOFOL N/A 05/15/2019   Procedure: ESOPHAGOGASTRODUODENOSCOPY (EGD) WITH PROPOFOL;  Surgeon: Lucretia Roers, MD;  Location: [facility] ORS;  Service: General;  Laterality: N/A;  . FLEXIBLE SIGMOIDOSCOPY N/A 10/21/2016   Procedure: FLEXIBLE SIGMOIDOSCOPY;  Surgeon: Malissa Hippo, MD;  Location: [facility] ENDO SUITE;   Service: Endoscopy;  Laterality: N/A;  . FLEXIBLE SIGMOIDOSCOPY N/A 10/25/2016   Procedure: FLEXIBLE SIGMOIDOSCOPY;  Surgeon: Malissa Hippo, MD;  Location: [facility] ENDO SUITE;  Service: Endoscopy;  Laterality: N/A;  . PEG PLACEMENT N/A 05/15/2019   Procedure: PERCUTANEOUS ENDOSCOPIC GASTROSTOMY (PEG) PLACEMENT;  Surgeon: Lucretia Roers, MD;  Location: [facility] ORS;  Service: General;  Laterality: N/A;  . unable      Social History   Socioeconomic History  . Marital status: Single    Spouse name: Not on file  . Number of children: Not on file  . Years of education: Not on file  . Highest education level: Not on file  Occupational History  . Not on file  Tobacco Use  . Smoking status: Never Smoker  . Smokeless tobacco: Never Used  Substance and Sexual Activity  . Alcohol use: No    Alcohol/week: 0.0 standard drinks  . Drug use: No  . Sexual activity: Not on file  Other Topics Concern  . Not on file  Social History Narrative  . Not on file   Social Determinants of Health   Financial Resource Strain:   . Difficulty of Paying Living Expenses: Not on file  Food Insecurity:   . Worried About Programme researcher, broadcasting/film/video in the Last Year: Not on file  . Ran Out of Food in the Last Year: Not on file  Transportation Needs:   . Lack of Transportation (Medical): Not on  file  . Lack of Transportation (Non-Medical): Not on file  Physical Activity:   . Days of Exercise per Week: Not on file  . Minutes of Exercise per Session: Not on file  Stress:   . Feeling of Stress : Not on file  Social Connections:   . Frequency of Communication with Friends and Family: Not on file  . Frequency of Social Gatherings with Friends and Family: Not on file  . Attends Religious Services: Not on file  . Active Member of Clubs or Organizations: Not on file  . Attends Archivist Meetings: Not on file  . Marital Status: Not on file  Intimate Partner Violence:   . Fear of Current or Ex-Partner: Not on file   . Emotionally Abused: Not on file  . Physically Abused: Not on file  . Sexually Abused: Not on file   No family history on file.    VITAL SIGNS BP (!) 143/64   Pulse 81   Temp (!) 97.3 F (36.3 C) (Oral)   Resp 16   Ht 5' 8.5" (1.74 m)   Wt 230 lb 3.2 oz (104.4 kg)   SpO2 97%   BMI 34.49 kg/m   Outpatient Encounter Medications as of 06/11/2019  Medication Sig  . acetaminophen (TYLENOL) 325 MG tablet Take 650 mg by mouth every 6 (six) hours as needed for moderate pain.   Marland Kitchen antiseptic oral rinse (BIOTENE) LIQD Special Instructions: clean mouth with swab Every 6 Hours   . antiseptic oral rinse (BIOTENE) LIQD Mucous membrane  Every 6 Hours - PRN  . apixaban (ELIQUIS) 5 MG TABS tablet Place 1 tablet (5 mg total) into feeding tube 2 (two) times daily.  Marland Kitchen atorvastatin (LIPITOR) 10 MG tablet Take 10 mg by mouth daily.  Marland Kitchen atropine 1 % ophthalmic solution Place 1 drop under the tongue every 6 (six) hours as needed.  . bisacodyl (DULCOLAX) 10 MG suppository Place 1 suppository (10 mg total) rectally at bedtime.  . budesonide-formoterol (SYMBICORT) 80-4.5 MCG/ACT inhaler Inhale 2 puffs into the lungs 2 (two) times daily.  . carboxymethylcellulose (REFRESH PLUS) 0.5 % SOLN Place 1 drop into the right eye 3 (three) times daily.   Marland Kitchen erythromycin ophthalmic ointment Place into the left eye. appy 1 ribbon ophthalmic (eye) 3 times a day  . Insulin Glargine (BASAGLAR KWIKPEN) 100 UNIT/ML SOPN Inject 25 Units into the skin at bedtime.   . insulin lispro (HUMALOG KWIKPEN) 100 UNIT/ML KwikPen Inject 15 Units into the skin 4 (four) times daily. With tube feedings  . ipratropium-albuterol (DUONEB) 0.5-2.5 (3) MG/3ML SOLN Take 3 mLs by nebulization every 6 (six) hours as needed.  . labetalol (NORMODYNE) 200 MG tablet Take 200 mg by mouth daily. For HTN  . loratadine (CLARITIN) 10 MG tablet Take 10 mg by mouth daily.  . methimazole (TAPAZOLE) 5 MG tablet Take 5 mg by mouth daily.  . Multiple  Vitamins-Minerals (ALIVE MULTI-VITAMIN) LIQD Amt: 15cc; gastric tube  Once A Day 09:00 AM  . NON FORMULARY Bolus feedings-Osmolite 1.2 via PEG-QID Special Instructions: Give 43ml at each feeding Four Times A Day 09:00 AM, 12:00 PM, 05:00 PM, 09:00 PM  . OXYGEN Inhale 2 L into the lungs. Every shift  . sennosides (SENOKOT) 8.8 MG/5ML syrup Place 5 mLs into feeding tube 2 (two) times daily as needed for mild constipation.  Marland Kitchen umeclidinium bromide (INCRUSE ELLIPTA) 62.5 MCG/INH AEPB Inhale 1 puff into the lungs daily.  . vitamin C (ASCORBIC ACID) 500 MG tablet Take  500 mg by mouth daily.  . Water For Irrigation, Sterile (FREE WATER) SOLN Place 300 mLs into feeding tube. Every shift  . zinc sulfate 220 (50 Zn) MG capsule Take 220 mg by mouth daily.   No facility-administered encounter medications on file as of 06/11/2019.     SIGNIFICANT DIAGNOSTIC EXAMS  PREVIOUS;   01-16-19: chest x-ray: Infiltrate versus atelectasis at LEFT base   05-05-19: chest x-ray: 1. Normal chest x-ray.  2. No tuberculosis is noted   05-08-19: KUB: Findings suggest constipation, fecal impaction or decreased colonic motility. Correlate with risk factors for constipation or fecal stasis, based on frequency or absence of bowel movements. Overall sensitivity is decreased, due to attenuation from patient body habitus.  Soft tissue attenuation results in significantly diminished diagnostic sensitivity and utility.  05-09-19: chest x-ray: Mild bibasilar subsegmental atelectasis or infiltrates are noted   05-09-19: ct of abdomen and pelvis:  1. Moderately large amount of stool in the distal sigmoid colon and rectum. Moderate gaseous distension of the more proximal sigmoid colon without evidence of volvulus or bowel obstruction. 2. Bibasilar lung consolidation concerning for pneumonia. 3. Aortic Atherosclerosis   07-12-18: chest x-ray: Cardiomegaly with low lung volumes and bibasilar collapse/consolidation, left  greater than right.  05-16-19: chest x-ray: ETT in good position. Worsened bilateral airspace disease small pleural effusions.  05-19-19: chest x-ray: Partial but incomplete clearing of opacity from the basis. Patchy airspace opacity remains in the lower lung regions with consolidation in a portion of the left base. There are small pleural effusions bilaterally. Upper lung regions appear clear. Heart upper normal in size. No adenopathy demonstrable by radiography.  NO NEW EXAMS     LABS REVIEWED: PREVIOUS   07-28-18: tsh 0.299; free T3: 2.8; free T4: 0.92 08-17-18: INR 2.5  09-14-18: INR 2.7 10-05-18: INR 3.3 10-12-18: INR 1.9  10-19-18: INR 2.9 10-26-18: wbc 8.8; hgb 15.7; hct 49.2; mcv 95.7; plt 240; glucose 74; bun 25; creat 1.05; k+ 3.9; na++ 141; ca 8.9 INR 2.1  11-27-18: INR 2.7 tsh 0.051 free T3: 3.2; free T4: 1.16 12-11-18: INR 3.3 12-13-18: INR 2.7 12-20-18: INR 2.4 tsh 0.444 free T3: 2.4 free T4: 0.90 01-02-19: INR 2.9  01-09-19: INR 3.5 01-16-19: INR 4.4 01-18-19: INR 3.3  01-23-19: INR 1.4  02-05-19: wbc 9.9; hgb 15.3; hct 482; mcv 96.4; plt 236; glucose 99; bun 46; creat 1.54; k+ 4.0; na++ 139; ca 8.7; liver normal albumin 3.2; chol 106; ldl 62; trig 54; hdl 33; PSA 10.58 (scheduled for 03-31-19)  03-16-19: tsh 2.668 free T4: 0.81 05-01-19: PSA 11.82 05-05-19: wbc 12.3; hgb 16.7; hct 53.4; mcv 98.2 plt 246; blood culture: no growth 05-08-19: PSA 16.39 05-09-19: wbc 13.3; hgb 16.9; hct 56.5; mcv 102.7 plt 261; glucose 689; bun 82; creat 2.78; k+ 5.3; na++ 151; ca 8.9; urine culture: enterococcus faecalis:  Hgb a1c 11.3 05-11-19: glucose 98; bun 36; creat 1.61; k+ 3.8; na++ 158; ca 7.8; phos 2.0 albumin 2.6 05-15-19: wbc 8.2; hgb 12.9; hct 42.2; mcv 100.2 plt 165; glucose 178; bun 15; creat 1.37; k+ 3.5; na++ 143; ca 7.5  05-18-19: wbc 7.8; hgb 13.3; hct 43.2; mcv 99.1 plt 227; glucose 246; bun 28; creat 1.21; k+ 3.2; na++ 139 ca 7.7 05-20-19: wbc 9.5; hgb 13.3; hct 44.0; mcv 98.9 plt 264;  glucose 190; bun 25; creat 1.10; k+ 3.7; na++ 138; ca 8.3 ;liver normal albumin 2.4   NO NEW LABS.     Review of Systems  Constitutional: Negative for malaise/fatigue.  Respiratory: Negative for cough and shortness of breath.   Cardiovascular: Negative for chest pain, palpitations and leg swelling.  Gastrointestinal: Negative for abdominal pain, constipation and heartburn.  Musculoskeletal: Negative for back pain, joint pain and myalgias.  Skin: Negative.   Neurological: Negative for dizziness.  Psychiatric/Behavioral: The patient is not nervous/anxious.     Physical Exam Constitutional:      General: He is not in acute distress.    Appearance: He is well-developed. He is not diaphoretic.  Neck:     Thyroid: No thyromegaly.  Cardiovascular:     Rate and Rhythm: Normal rate and regular rhythm.     Pulses: Normal pulses.     Heart sounds: Normal heart sounds.  Pulmonary:     Effort: Pulmonary effort is normal. No respiratory distress.     Breath sounds: Wheezing present.     Comments: 02 dependent  Abdominal:     General: Bowel sounds are normal. There is no distension.     Palpations: Abdomen is soft.     Tenderness: There is no abdominal tenderness.     Comments: Peg tube present   Musculoskeletal:     Cervical back: Neck supple.     Right lower leg: No edema.     Left lower leg: No edema.     Comments: Right hemiplegia Is able to move left extremities       Lymphadenopathy:     Cervical: No cervical adenopathy.  Skin:    General: Skin is warm and dry.  Neurological:     Mental Status: He is alert. Mental status is at baseline.  Psychiatric:        Mood and Affect: Mood normal.      ASSESSMENT/ PLAN:  TODAY  1. Cerebrovascular accident unspecified mechanism 2 simple chronic bronchitis 3. 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   Will continue full code  His family has been advised; and is in agreement with this plan of care.     MD is aware of resident's narcotic use and is in agreement with current plan of care. We will attempt to wean resident as appropriate.  Synthia Innocenteborah Armetta Henri NP [facility]  Contact [phone] Monday through Friday 8am- 5pm  After hours call [phone]

## 2019-06-12 ENCOUNTER — Non-Acute Institutional Stay (SKILLED_NURSING_FACILITY): Payer: Medicare Other | Admitting: Adult Health

## 2019-06-12 ENCOUNTER — Encounter: Payer: Self-pay | Admitting: Adult Health

## 2019-06-12 DIAGNOSIS — E1149 Type 2 diabetes mellitus with other diabetic neurological complication: Secondary | ICD-10-CM | POA: Diagnosis not present

## 2019-06-12 DIAGNOSIS — IMO0002 Reserved for concepts with insufficient information to code with codable children: Secondary | ICD-10-CM

## 2019-06-12 DIAGNOSIS — E1165 Type 2 diabetes mellitus with hyperglycemia: Secondary | ICD-10-CM

## 2019-06-12 DIAGNOSIS — I10 Essential (primary) hypertension: Secondary | ICD-10-CM | POA: Diagnosis not present

## 2019-06-12 DIAGNOSIS — Z794 Long term (current) use of insulin: Secondary | ICD-10-CM

## 2019-06-12 DIAGNOSIS — J410 Simple chronic bronchitis: Secondary | ICD-10-CM

## 2019-06-12 NOTE — Progress Notes (Signed)
Location:  [facility] Room Number: 112-W Place of Service:  SNF (31)   CODE STATUS:  Full Code  Allergies  Allergen Reactions  . Penicillins     Has patient had a PCN reaction causing immediate rash, facial/tongue/throat swelling, SOB or lightheadedness with hypotension: unknown Has patient had a PCN reaction causing severe rash involving mucus membranes or skin necrosis: unknown Has patient had a PCN reaction that required hospitalization: unknown Has patient had a PCN reaction occurring within the last 10 years: unknown If all of the above answers are "NO", then may proceed with Cephalosporin use. 5/3 Tolerated Cefepime x 1 dose in ED.     Chief Complaint  Patient presents with  . Acute Visit    Patient is seen for COVID vaccination    HPI:  We have the mederna vaccine. His risk factors include: hypertension; diabetes; chronic bronchitis; long term resident of snf. I have spoken with him regarding the 2 injection process; side effects possible severe effects and effectiveness. He has agreed to the vaccine; his family has as well. There are no signs of aspiration at this time; no uncontrolled pain; no reports of fevers.   Past Medical History:  Diagnosis Date  . Dysphasia   . Fatty liver   . Hemiplegia (HCC)   . Hypertension   . Pneumonia   . Pulmonary embolism (HCC)   . 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(HCC)   . Stroke [facility](HCC)     Past Surgical History:  Procedure Laterality Date  . ESOPHAGOGASTRODUODENOSCOPY (EGD) WITH PROPOFOL N/A 05/15/2019   Procedure: ESOPHAGOGASTRODUODENOSCOPY (EGD) WITH PROPOFOL;  Surgeon: Lucretia RoersBridges, Lindsay C, MD;  Location: [facility];  Service: General;  Laterality: N/A;  . FLEXIBLE SIGMOIDOSCOPY N/A 10/21/2016   Procedure: FLEXIBLE SIGMOIDOSCOPY;  Surgeon: Malissa Hippoehman, Najeeb U, MD;  Location: [facility];  Service: Endoscopy;  Laterality: N/A;  . FLEXIBLE SIGMOIDOSCOPY N/A 10/25/2016   Procedure: FLEXIBLE SIGMOIDOSCOPY;  Surgeon: Malissa Hippoehman, Najeeb  U, MD;  [facility]: [facility];  Service: Endoscopy;  Laterality: N/A;  . PEG PLACEMENT N/A 05/15/2019   Procedure: PERCUTANEOUS ENDOSCOPIC GASTROSTOMY (PEG) PLACEMENT;  Surgeon: Lucretia RoersBridges, Lindsay C, MD;  Location: [facility];  Service: General;  Laterality: N/A;  . unable      Social History   Socioeconomic History  . Marital status: Single    Spouse name: Not on file  . Number of children: Not on file  . Years of education: Not on file  . Highest education level: Not on file  Occupational History  . Not on file  Tobacco Use  . Smoking status: Never Smoker  . Smokeless tobacco: Never Used  Substance and Sexual Activity  . Alcohol use: No    Alcohol/week: 0.0 standard drinks  . Drug use: No  . Sexual activity: Not on file  Other Topics Concern  . Not on file  Social History Narrative  . Not on file   Social Determinants of Health   Financial Resource Strain:   . Difficulty of Paying Living Expenses: Not on file  Food Insecurity:   . Worried About Programme researcher, broadcasting/film/videounning Out of Food in the Last Year: Not on file  . Ran Out of Food in the Last Year: Not on file  Transportation Needs:   . Lack of Transportation (Medical): Not on file  . Lack of Transportation (Non-Medical): Not on file  Physical Activity:   . Days of Exercise per Week: Not on file  . Minutes of Exercise per Session: Not on file  Stress:   . Feeling of Stress : Not on file  Social Connections:   . Frequency of Communication with Friends and Family: Not on file  . Frequency of Social Gatherings with Friends and Family: Not on file  . Attends Religious Services: Not on file  . Active Member of Clubs or Organizations: Not on file  . Attends Banker Meetings: Not on file  . Marital Status: Not on file  Intimate Partner Violence:   . Fear of Current or Ex-Partner: Not on file  . Emotionally Abused: Not on file  . Physically Abused: Not on file  . Sexually Abused: Not on file   No family history on  file.    VITAL SIGNS BP 119/74   Pulse 86   Temp 98.2 F (36.8 C) (Oral)   Resp (!) 22   Ht 5' 8.5" (1.74 m)   Wt 230 lb 3.2 oz (104.4 kg)   SpO2 97%   BMI 34.49 kg/m   Outpatient Encounter Medications as of 06/12/2019  Medication Sig  . acetaminophen (TYLENOL) 325 MG tablet Take 650 mg by mouth every 6 (six) hours as needed for moderate pain.   Marland Kitchen antiseptic oral rinse (BIOTENE) LIQD Special Instructions: clean mouth with swab Every 6 Hours   . antiseptic oral rinse (BIOTENE) LIQD Mucous membrane  Every 6 Hours - PRN  . apixaban (ELIQUIS) 5 MG TABS tablet Place 1 tablet (5 mg total) into feeding tube 2 (two) times daily.  Marland Kitchen atorvastatin (LIPITOR) 10 MG tablet Take 10 mg by mouth daily.  Marland Kitchen atropine 1 % ophthalmic solution Place 1 drop under the tongue every 6 (six) hours as needed.  . bisacodyl (DULCOLAX) 10 MG suppository Place 1 suppository (10 mg total) rectally at bedtime.  . budesonide-formoterol (SYMBICORT) 80-4.5 MCG/ACT inhaler Inhale 2 puffs into the lungs 2 (two) times daily.  . carboxymethylcellulose (REFRESH PLUS) 0.5 % SOLN Place 1 drop into the right eye 3 (three) times daily.   Marland Kitchen erythromycin ophthalmic ointment Place into the left eye. appy 1 ribbon ophthalmic (eye) 3 times a day  . Insulin Glargine (BASAGLAR KWIKPEN) 100 UNIT/ML SOPN Inject 25 Units into the skin at bedtime.   . insulin lispro (HUMALOG KWIKPEN) 100 UNIT/ML KwikPen Inject 15 Units into the skin 4 (four) times daily. With tube feedings  . ipratropium-albuterol (DUONEB) 0.5-2.5 (3) MG/3ML SOLN Take 3 mLs by nebulization every 6 (six) hours as needed.  . labetalol (NORMODYNE) 200 MG tablet Take 200 mg by mouth daily. For HTN  . loratadine (CLARITIN) 10 MG tablet Take 10 mg by mouth daily.  . methimazole (TAPAZOLE) 5 MG tablet Take 5 mg by mouth daily.  . Multiple Vitamins-Minerals (ALIVE MULTI-VITAMIN) LIQD Amt: 15cc; gastric tube  Once A Day 09:00 AM  . NON FORMULARY Bolus feedings-Osmolite 1.2 via  PEG-QID Special Instructions: Give at each feeding Four Times A Day 09:00 AM, 12:00 PM, 05:00 PM, 09:00 PM  . OXYGEN Inhale 2 L into the lungs. Every shift  . sennosides (SENOKOT) 8.8 MG/5ML syrup Place 5 mLs into feeding tube 2 (two) times daily as needed for mild constipation.  Marland Kitchen umeclidinium bromide (INCRUSE ELLIPTA) 62.5 MCG/INH AEPB Inhale 1 puff into the lungs daily.  . Water For Irrigation, Sterile (FREE WATER) SOLN Place 300 mLs into feeding tube. Every shift  . [DISCONTINUED] vitamin C (ASCORBIC ACID) 500 MG tablet Take 500 mg by mouth daily.  . [DISCONTINUED] zinc sulfate 220 (50 Zn) MG capsule Take 220 mg  by mouth daily.   No facility-administered encounter medications on file as of 06/12/2019.     SIGNIFICANT DIAGNOSTIC EXAMS   PREVIOUS;   01-16-19: chest x-ray: Infiltrate versus atelectasis at LEFT base   05-05-19: chest x-ray: 1. Normal chest x-ray.  2. No tuberculosis is noted   05-08-19: KUB: Findings suggest constipation, fecal impaction or decreased colonic motility. Correlate with risk factors for constipation or fecal stasis, based on frequency or absence of bowel movements. Overall sensitivity is decreased, due to attenuation from patient body habitus.  Soft tissue attenuation results in significantly diminished diagnostic sensitivity and utility.  05-09-19: chest x-ray: Mild bibasilar subsegmental atelectasis or infiltrates are noted   05-09-19: ct of abdomen and pelvis:  1. Moderately large amount of stool in the distal sigmoid colon and rectum. Moderate gaseous distension of the more proximal sigmoid colon without evidence of volvulus or bowel obstruction. 2. Bibasilar lung consolidation concerning for pneumonia. 3. Aortic Atherosclerosis   07-12-18: chest x-ray: Cardiomegaly with low lung volumes and bibasilar collapse/consolidation, left greater than right.  05-16-19: chest x-ray: ETT in good position. Worsened bilateral airspace disease small  pleural effusions.  05-19-19: chest x-ray: Partial but incomplete clearing of opacity from the basis. Patchy airspace opacity remains in the lower lung regions with consolidation in a portion of the left base. There are small pleural effusions bilaterally. Upper lung regions appear clear. Heart upper normal in size. No adenopathy demonstrable by radiography.  NO NEW EXAMS     LABS REVIEWED: PREVIOUS   07-28-18: tsh 0.299; free T3: 2.8; free T4: 0.92 08-17-18: INR 2.5  09-14-18: INR 2.7 10-05-18: INR 3.3 10-12-18: INR 1.9  10-19-18: INR 2.9 10-26-18: wbc 8.8; hgb 15.7; hct 49.2; mcv 95.7; plt 240; glucose 74; bun 25; creat 1.05; k+ 3.9; na++ 141; ca 8.9 INR 2.1  11-27-18: INR 2.7 tsh 0.051 free T3: 3.2; free T4: 1.16 12-11-18: INR 3.3 12-13-18: INR 2.7 12-20-18: INR 2.4 tsh 0.444 free T3: 2.4 free T4: 0.90 01-02-19: INR 2.9  01-09-19: INR 3.5 01-16-19: INR 4.4 01-18-19: INR 3.3  01-23-19: INR 1.4  02-05-19: wbc 9.9; hgb 15.3; hct 482; mcv 96.4; plt 236; glucose 99; bun 46; creat 1.54; k+ 4.0; na++ 139; ca 8.7; liver normal albumin 3.2; chol 106; ldl 62; trig 54; hdl 33; PSA 10.58 (scheduled for 03-31-19)  03-16-19: tsh 2.668 free T4: 0.81 05-01-19: PSA 11.82 05-05-19: wbc 12.3; hgb 16.7; hct 53.4; mcv 98.2 plt 246; blood culture: no growth 05-08-19: PSA 16.39 05-09-19: wbc 13.3; hgb 16.9; hct 56.5; mcv 102.7 plt 261; glucose 689; bun 82; creat 2.78; k+ 5.3; na++ 151; ca 8.9; urine culture: enterococcus faecalis:  Hgb a1c 11.3 05-11-19: glucose 98; bun 36; creat 1.61; k+ 3.8; na++ 158; ca 7.8; phos 2.0 albumin 2.6 05-15-19: wbc 8.2; hgb 12.9; hct 42.2; mcv 100.2 plt 165; glucose 178; bun 15; creat 1.37; k+ 3.5; na++ 143; ca 7.5  05-18-19: wbc 7.8; hgb 13.3; hct 43.2; mcv 99.1 plt 227; glucose 246; bun 28; creat 1.21; k+ 3.2; na++ 139 ca 7.7 05-20-19: wbc 9.5; hgb 13.3; hct 44.0; mcv 98.9 plt 264; glucose 190; bun 25; creat 1.10; k+ 3.7; na++ 138; ca 8.3 ;liver normal albumin 2.4   NO NEW LABS.     Review of Systems  Constitutional: Negative for malaise/fatigue.  Respiratory: Negative for cough and shortness of breath.   Cardiovascular: Negative for chest pain, palpitations and leg swelling.  Gastrointestinal: Negative for abdominal pain, constipation and heartburn.  Musculoskeletal: Negative for back  pain, joint pain and myalgias.  Skin: Negative.   Neurological: Negative for dizziness.  Psychiatric/Behavioral: The patient is not nervous/anxious.     Physical Exam Constitutional:      General: He is not in acute distress.    Appearance: He is well-developed. He is obese. He is not diaphoretic.  Neck:     Thyroid: No thyromegaly.  Cardiovascular:     Rate and Rhythm: Normal rate and regular rhythm.     Pulses: Normal pulses.     Heart sounds: Normal heart sounds.  Pulmonary:     Effort: Pulmonary effort is normal. No respiratory distress.     Breath sounds: Normal breath sounds.  Abdominal:     General: Bowel sounds are normal. There is no distension.     Palpations: Abdomen is soft.     Tenderness: There is no abdominal tenderness.     Comments: Peg tube present   Musculoskeletal:     Cervical back: Neck supple.     Right lower leg: No edema.     Left lower leg: No edema.     Comments: Right hemiplegia Is able to move left extremities        Lymphadenopathy:     Cervical: No cervical adenopathy.  Skin:    General: Skin is warm and dry.  Neurological:     Mental Status: He is alert. Mental status is at baseline.  Psychiatric:        Mood and Affect: Mood normal.      ASSESSMENT/ PLAN:  TODAY  1. Long term resident of snf 2. Essential hypertension 3. Simple chronic bronchitis 4. 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  Will setup to receive the first vaccine 06-19-19.     MD is aware of resident's narcotic use and is in agreement with current plan of care. We will attempt to wean  resident as appropriate.  Ok Edwards NP [facility]  Contact [phone] Monday through Friday 8am- 5pm  After hours call [phone]

## 2019-06-13 ENCOUNTER — Non-Acute Institutional Stay (SKILLED_NURSING_FACILITY): Payer: Medicare Other | Admitting: Adult Health

## 2019-06-13 ENCOUNTER — Encounter: Payer: Self-pay | Admitting: Adult Health

## 2019-06-13 DIAGNOSIS — I82502 Chronic embolism and thrombosis of unspecified deep veins of left lower extremity: Secondary | ICD-10-CM | POA: Diagnosis not present

## 2019-06-13 DIAGNOSIS — E1165 Type 2 diabetes mellitus with hyperglycemia: Secondary | ICD-10-CM

## 2019-06-13 DIAGNOSIS — I2782 Chronic pulmonary embolism: Secondary | ICD-10-CM | POA: Diagnosis not present

## 2019-06-13 DIAGNOSIS — Z794 Long term (current) use of insulin: Secondary | ICD-10-CM

## 2019-06-13 DIAGNOSIS — E1149 Type 2 diabetes mellitus with other diabetic neurological complication: Secondary | ICD-10-CM | POA: Diagnosis not present

## 2019-06-13 DIAGNOSIS — IMO0002 Reserved for concepts with insufficient information to code with codable children: Secondary | ICD-10-CM

## 2019-06-13 LAB — NOVEL CORONAVIRUS, NAA (HOSP ORDER, SEND-OUT TO REF LAB; TAT 18-24 HRS): SARS-CoV-2, NAA: NOT DETECTED

## 2019-06-13 NOTE — Progress Notes (Signed)
Location:    [facility] Room Number: 112/W Place of Service:  SNF (31)   CODE STATUS: Full Code  Allergies  Allergen Reactions  . Penicillins     Has patient had a PCN reaction causing immediate rash, facial/tongue/throat swelling, SOB or lightheadedness with hypotension: unknown Has patient had a PCN reaction causing severe rash involving mucus membranes or skin necrosis: unknown Has patient had a PCN reaction that required hospitalization: unknown Has patient had a PCN reaction occurring within the last 10 years: unknown If all of the above answers are "NO", then may proceed with Cephalosporin use. 5/3 Tolerated Cefepime x 1 dose in ED.    Chief Complaint  Patient presents with  . Medical Management of Chronic Issues        Deep vein thrombosis (DVT) of left extremity unspecified vein  other chronic pulmonary embolism without acute cor pulmonale: Type 2 diabetes mellitus new onset with long term current use of insulin:   Weekly follow up for the first 30 days post hospitalization.      HPI:  He is a 73 year old long term resident of this facility being seen for the management of his chronic illnesses: dvt; pe; diabetes. There are no reports of uncontrolled pain; no signs of active aspiration; no changes in appetite.   Past Medical History:  Diagnosis Date  . Dysphasia   . Fatty liver   . Hemiplegia (HCC)   . Hypertension   . Pneumonia   . Pulmonary embolism (HCC)   . 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(HCC)   . Stroke [facility])     Past Surgical History:  Procedure Laterality Date  . ESOPHAGOGASTRODUODENOSCOPY (EGD) WITH PROPOFOL N/A 05/15/2019   Procedure: ESOPHAGOGASTRODUODENOSCOPY (EGD) WITH PROPOFOL;  Surgeon: Lucretia Roers, MD;  Location: [facility] ORS;  Service: General;  Laterality: N/A;  . FLEXIBLE SIGMOIDOSCOPY N/A 10/21/2016   Procedure: FLEXIBLE SIGMOIDOSCOPY;  Surgeon: Malissa Hippo, MD;  Location: [facility] ENDO SU[facility];  Service: Endoscopy;  Laterality: N/A;  .  FLEXIBLE SIGMOIDOSCOPY N/A 10/25/2016   Procedure: FLEXIBLE SIGMOIDOSCOPY;  Surgeon: Malissa Hippo, MD;  Location: [facility] END[facility] SUITE;  Service: Endoscopy;  Laterality: N/A;  . PEG PLACEMENT N/A 05/15/2019   Procedure: PERCUTANEOUS ENDOSCOPIC GASTROSTOMY (PEG) PLACEMENT;  Surgeon: Lucretia Roers, MD;  Location: [facility] ORS;  Service: General;  Laterality: N/A;  . unable      Social History   Socioeconomic History  . Marital status: Single    Spouse name: Not on file  . Number of children: Not on file  . Years of education: Not on file  . Highest education level: Not on file  Occupational History  . Not on file  Tobacco Use  . Smoking status: Never Smoker  . Smokeless tobacco: Never Used  Substance and Sexual Activity  . Alcohol use: No    Alcohol/week: 0.0 standard drinks  . Drug use: No  . Sexual activity: Not on file  Other Topics Concern  . Not on file  Social History Narrative  . Not on file   Social Determinants of Health   Financial Resource Strain:   . Difficulty of Paying Living Expenses: Not on file  Food Insecurity:   . Worried About Programme researcher, broadcasting/film/video in the Last Year: Not on file  . Ran Out of Food in the Last Year: Not on file  Transportation Needs:   . Lack of Transportation (Medical): Not on file  . Lack of Transportation (Non-Medical): Not on file  Physical Activity:   . Days of Exercise per Week: Not on file  . Minutes of Exercise per Session: Not on file  Stress:   . Feeling of Stress : Not on file  Social Connections:   . Frequency of Communication with Friends and Family: Not on file  . Frequency of Social Gatherings with Friends and Family: Not on file  . Attends Religious Services: Not on file  . Active Member of Clubs or Organizations: Not on file  . Attends BankerClub or Organization Meetings: Not on file  . Marital Status: Not on file  Intimate Partner Violence:   . Fear of Current or Ex-Partner: Not on file  . Emotionally Abused: Not on file  .  Physically Abused: Not on file  . Sexually Abused: Not on file   No family history on file.    VITAL SIGNS BP 119/74   Pulse 86   Temp 98.2 F (36.8 C) (Oral)   Resp (!) 22   Ht 5' 8.5" (1.74 m)   Wt 230 lb 3.2 oz (104.4 kg)   SpO2 98%   BMI 34.49 kg/m   Outpatient Encounter Medications as of 06/13/2019  Medication Sig  . acetaminophen (TYLENOL) 325 MG tablet Take 650 mg by mouth every 6 (six) hours as needed for moderate pain.   Marland Kitchen. antiseptic oral rinse (BIOTENE) LIQD Special Instructions: clean mouth with swab Every 6 Hours   . antiseptic oral rinse (BIOTENE) LIQD Mucous membrane  Every 6 Hours - PRN  . apixaban (ELIQUIS) 5 MG TABS tablet Place 1 tablet (5 mg total) into feeding tube 2 (two) times daily.  Marland Kitchen. atorvastatin (LIPITOR) 10 MG tablet Take 10 mg by mouth daily.  Marland Kitchen. atropine 1 % ophthalmic solution Place 1 drop under the tongue every 6 (six) hours as needed.  . bisacodyl (DULCOLAX) 10 MG suppository Place 1 suppository (10 mg total) rectally at bedtime.  . budesonide-formoterol (SYMBICORT) 80-4.5 MCG/ACT inhaler Inhale 2 puffs into the lungs 2 (two) times daily.  . carboxymethylcellulose (REFRESH PLUS) 0.5 % SOLN Place 1 drop into the right eye 3 (three) times daily.   Marland Kitchen. erythromycin ophthalmic ointment Place into the left eye. appy 1 ribbon ophthalmic (eye) 3 times a day  . Insulin Glargine (BASAGLAR KWIKPEN) 100 UNIT/ML SOPN Inject 25 Units into the skin at bedtime.   . insulin lispro (HUMALOG KWIKPEN) 100 UNIT/ML KwikPen Inject 15 Units into the skin 3 (three) times daily. With tube feedings  . ipratropium-albuterol (DUONEB) 0.5-2.5 (3) MG/3ML SOLN Take 3 mLs by nebulization every 6 (six) hours as needed.  . labetalol (NORMODYNE) 200 MG tablet Take 200 mg by mouth daily. For HTN  . loratadine (CLARITIN) 10 MG tablet Take 10 mg by mouth daily.  . methimazole (TAPAZOLE) 5 MG tablet Take 5 mg by mouth daily.  . Multiple Vitamins-Minerals (ALIVE MULTI-VITAMIN) LIQD Amt:  15cc; gastric tube  Once A Day 09:00 AM  . NON FORMULARY Diet- Liquids: Honey Thick Liquids  Diet: Puree Honey Thick Liquids  . OXYGEN Inhale 2 L into the lungs. Every shift  . sennosides (SENOKOT) 8.8 MG/5ML syrup Place 5 mLs into feeding tube 2 (two) times daily as needed for mild constipation.  Marland Kitchen. umeclidinium bromide (INCRUSE ELLIPTA) 62.5 MCG/INH AEPB Inhale 1 puff into the lungs daily.  . Water For Irrigation, Sterile (FREE WATER) SOLN Flush G-Tube with 30 ml water Bid   No facility-administered encounter medications on file as of 06/13/2019.     SIGNIFICANT DIAGNOSTIC EXAMS  PREVIOUS;   01-16-19: chest x-ray: Infiltrate versus atelectasis at LEFT base   05-05-19: chest x-ray: 1. Normal chest x-ray.  2. No tuberculosis is noted   05-08-19: KUB: Findings suggest constipation, fecal impaction or decreased colonic motility. Correlate with risk factors for constipation or fecal stasis, based on frequency or absence of bowel movements. Overall sensitivity is decreased, due to attenuation from patient body habitus.  Soft tissue attenuation results in significantly diminished diagnostic sensitivity and utility.  05-09-19: chest x-ray: Mild bibasilar subsegmental atelectasis or infiltrates are noted   05-09-19: ct of abdomen and pelvis:  1. Moderately large amount of stool in the distal sigmoid colon and rectum. Moderate gaseous distension of the more proximal sigmoid colon without evidence of volvulus or bowel obstruction. 2. Bibasilar lung consolidation concerning for pneumonia. 3. Aortic Atherosclerosis   07-12-18: chest x-ray: Cardiomegaly with low lung volumes and bibasilar collapse/consolidation, left greater than right.  05-16-19: chest x-ray: ETT in good position. Worsened bilateral airspace disease small pleural effusions.  05-19-19: chest x-ray: Partial but incomplete clearing of opacity from the basis. Patchy airspace opacity remains in the lower lung regions with  consolidation in a portion of the left base. There are small pleural effusions bilaterally. Upper lung regions appear clear. Heart upper normal in size. No adenopathy demonstrable by radiography.  NO NEW EXAMS     LABS REVIEWED: PREVIOUS   07-28-18: tsh 0.299; free T3: 2.8; free T4: 0.92 08-17-18: INR 2.5  09-14-18: INR 2.7 10-05-18: INR 3.3 10-12-18: INR 1.9  10-19-18: INR 2.9 10-26-18: wbc 8.8; hgb 15.7; hct 49.2; mcv 95.7; plt 240; glucose 74; bun 25; creat 1.05; k+ 3.9; na++ 141; ca 8.9 INR 2.1  11-27-18: INR 2.7 tsh 0.051 free T3: 3.2; free T4: 1.16 12-11-18: INR 3.3 12-13-18: INR 2.7 12-20-18: INR 2.4 tsh 0.444 free T3: 2.4 free T4: 0.90 01-02-19: INR 2.9  01-09-19: INR 3.5 01-16-19: INR 4.4 01-18-19: INR 3.3  01-23-19: INR 1.4  02-05-19: wbc 9.9; hgb 15.3; hct 482; mcv 96.4; plt 236; glucose 99; bun 46; creat 1.54; k+ 4.0; na++ 139; ca 8.7; liver normal albumin 3.2; chol 106; ldl 62; trig 54; hdl 33; PSA 10.58 (scheduled for 03-31-19)  03-16-19: tsh 2.668 free T4: 0.81 05-01-19: PSA 11.82 05-05-19: wbc 12.3; hgb 16.7; hct 53.4; mcv 98.2 plt 246; blood culture: no growth 05-08-19: PSA 16.39 05-09-19: wbc 13.3; hgb 16.9; hct 56.5; mcv 102.7 plt 261; glucose 689; bun 82; creat 2.78; k+ 5.3; na++ 151; ca 8.9; urine culture: enterococcus faecalis:  Hgb a1c 11.3 05-11-19: glucose 98; bun 36; creat 1.61; k+ 3.8; na++ 158; ca 7.8; phos 2.0 albumin 2.6 05-15-19: wbc 8.2; hgb 12.9; hct 42.2; mcv 100.2 plt 165; glucose 178; bun 15; creat 1.37; k+ 3.5; na++ 143; ca 7.5  05-18-19: wbc 7.8; hgb 13.3; hct 43.2; mcv 99.1 plt 227; glucose 246; bun 28; creat 1.21; k+ 3.2; na++ 139 ca 7.7 05-20-19: wbc 9.5; hgb 13.3; hct 44.0; mcv 98.9 plt 264; glucose 190; bun 25; creat 1.10; k+ 3.7; na++ 138; ca 8.3 ;liver normal albumin 2.4   NO NEW LABS.    Review of Systems  Constitutional: Negative for malaise/fatigue.  Respiratory: Negative for cough and shortness of breath.   Cardiovascular: Negative for chest pain,  palpitations and leg swelling.  Gastrointestinal: Negative for abdominal pain, constipation and heartburn.  Musculoskeletal: Negative for back pain, joint pain and myalgias.  Skin: Negative.   Neurological: Negative for dizziness.  Psychiatric/Behavioral: The patient is not nervous/anxious.  Physical Exam Constitutional:      General: He is not in acute distress.    Appearance: He is well-developed. He is obese. He is not diaphoretic.  Eyes:     Comments: Right eye is red and inflamed   Neck:     Thyroid: No thyromegaly.  Cardiovascular:     Rate and Rhythm: Normal rate and regular rhythm.     Pulses: Normal pulses.     Heart sounds: Normal heart sounds.  Pulmonary:     Effort: Pulmonary effort is normal. No respiratory distress.     Breath sounds: Normal breath sounds.  Abdominal:     General: Bowel sounds are normal. There is no distension.     Palpations: Abdomen is soft.     Tenderness: There is no abdominal tenderness.     Comments: Peg tube present   Musculoskeletal:     Cervical back: Neck supple.     Right lower leg: No edema.     Left lower leg: No edema.     Comments: Right hemiplegia Is able to move left extremities         Lymphadenopathy:     Cervical: No cervical adenopathy.  Skin:    General: Skin is warm and dry.  Neurological:     Mental Status: He is alert. Mental status is at baseline.  Psychiatric:        Mood and Affect: Mood normal.       ASSESSMENT/ PLAN:  TODAY:  1. Deep vein thrombosis (DVT) of left extremity unspecified vein/other chronic pulmonary embolism without acute cor pulmonale: is stable is on long term eliquis 5 mg twice daily   2. Type 2 diabetes mellitus new onset with long term current use of insulin: hgb a1c 11.3 is stable will continue basaglar 25 units nightly and humalog 15 units with meals  3. Right eye conjunctivitis: will begin bleph 10 1 drop to right eye four times daily through 06-21-19,   PREVIOUS   4.  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 Deep vein thrombosis (DVT) of left extremity unspecified/other chronic pulmonary embolism without acute cor pulmonale: is stable is on long term eliquis 5 mg twice daily   6. Type 2 diabetes mellitus new onset with long term current use of insulin: hgb a1c 11.3  is without change in status will continue basaglar 25  units nightly humalog 15 units with feedings. and will monitor his status.   7. 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   9. Essential hypertension: is stable b/p 128/82 will continue labetalol 200 mg daily   10. Cerebrovascular accident (CVA) due to other mechanism/hemiplegia or right dominant side due to cerebrovascular disease: is stable will continue eilqius 5 mg twice daily   11. Dyslipidemia: is stable LDL 54 will continue lipitor 10 mg daily   12. Hyperthyroidism: is stable tsh 0.051 free T3: 3.2 free T4: 1.16; will continue tapazole 5 mg daily     MD is aware of resident's narcotic use and is in agreement with current plan of care. We will attempt to wean resident as appropriate.  Ok Edwards NP [facility]  Contact [phone] Monday through Friday 8am- 5pm  After hours call [phone]

## 2019-06-17 ENCOUNTER — Other Ambulatory Visit (HOSPITAL_COMMUNITY)
Admission: RE | Admit: 2019-06-17 | Discharge: 2019-06-17 | Disposition: A | Discharge status: Home / Self Care | Payer: Medicare Other | Source: Ambulatory Visit | Attending: Adult Health | Admitting: Adult Health

## 2019-06-18 LAB — NOVEL CORONAVIRUS, NAA (HOSP ORDER, SEND-OUT TO REF LAB; TAT 18-24 HRS): SARS-CoV-2, NAA: NOT DETECTED

## 2019-06-19 ENCOUNTER — Other Ambulatory Visit (HOSPITAL_COMMUNITY): Payer: Self-pay | Admitting: Specialist

## 2019-06-19 DIAGNOSIS — I69391 Dysphagia following cerebral infarction: Secondary | ICD-10-CM

## 2019-06-20 ENCOUNTER — Inpatient Hospital Stay (HOSPITAL_COMMUNITY)
Admit: 2019-06-20 | Discharge: 2019-06-20 | Disposition: A | Discharge status: Home / Self Care | Payer: Medicare Other | Attending: Internal Medicine | Admitting: Internal Medicine

## 2019-06-20 ENCOUNTER — Ambulatory Visit (HOSPITAL_COMMUNITY): Payer: Medicare Other | Attending: Internal Medicine | Admitting: Speech Pathology

## 2019-06-20 DIAGNOSIS — I69391 Dysphagia following cerebral infarction: Secondary | ICD-10-CM | POA: Insufficient documentation

## 2019-06-21 ENCOUNTER — Encounter (HOSPITAL_COMMUNITY): Payer: Self-pay | Admitting: Speech Pathology

## 2019-06-21 NOTE — Therapy (Signed)
[facility] [address] [place], [place], [address] Phone: [phone]   Fax:  [phone]  Modified Barium Swallow  Patient Details  Name: [name] [name] MRN: [id number] Date of Birth: 09/25/1945 No data recorded  Encounter Date: 06/20/2019  End of Session - 06/21/19 1423    Visit Number  1    Number of Visits  1    Authorization Type  Medicare    SLP Start Time  1130    SLP Stop Time   1210    SLP Time Calculation (min)  40 min    Activity Tolerance  Patient tolerated treatment well       Past Medical History:  Diagnosis Date  . Dysphasia   . Fatty liver   . Hemiplegia (HCC)   . Hypertension   . Pneumonia   . Pulmonary embolism (HCC)   . 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(HCC)   . Stroke [facility])     Past Surgical History:  Procedure Laterality Date  . ESOPHAGOGASTRODUODENOSCOPY (EGD) WITH PROPOFOL N/A 05/15/2019   Procedure: ESOPHAGOGASTRODUODENOSCOPY (EGD) WITH PROPOFOL;  Surgeon: Lucretia Roers, MD;  Location: [facility];  Service: General;  Laterality: N/A;  . FLEXIBLE SIGMOIDOSCOPY N/A 10/21/2016   Procedure: FLEXIBLE SIGMOIDOSCOPY;  Surgeon: Malissa Hippo, MD;  Location: [facility];  Service: Endoscopy;  Laterality: N/A;  . FLEXIBLE SIGMOIDOSCOPY N/A 10/25/2016   Procedure: FLEXIBLE SIGMOIDOSCOPY;  Surgeon: Malissa Hippo, MD;  [facility];  Service: Endoscopy;  Laterality: N/A;  . PEG PLACEMENT N/A 05/15/2019   Procedure: PERCUTANEOUS ENDOSCOPIC GASTROSTOMY (PEG) PLACEMENT;  Surgeon: Lucretia Roers, MD;  Location: [facility];  Service: General;  Laterality: N/A;  . unable      There were no vitals filed for this visit.  Subjective Assessment - 06/21/19 1337    Special Tests  MBSS    Currently in Pain?  No/denies    Pain Score  0-No pain          General - 06/21/19 1337      General Information   Date of Onset  05/15/19    HPI  [name] [name] is a 73 yo male who was referred by Dr. Merrilee Seashore for  MBSS due to h/o dysphagia with recent PEG placement 05/15/19. His last MBSS was 10/28/2016 with recommendation for D1 and honey-thick liquids. He was hospitalized in November for hypoxia and bilateral PNA due to suspected aspiration. He was intubated for several days and discharged to SNF with a PEG (no SLP consult during that acute admission). Pt with past medical history significant for stroke with residual right hemiparesis. Pt has been consuming puree and honey-thick liquids for the past week or so and not using PEG for nutrition per treating SLP.    Type of Study  MBS-Modified Barium Swallow Study    Previous Swallow Assessment  10/28/2016 MBSS D1/HTL    Diet Prior to this Study  Dysphagia 1 (puree);Honey-thick liquids;PEG tube    Temperature Spikes Noted  No    Respiratory Status  Room air    History of Recent Intubation  No    Behavior/Cognition  Alert;Cooperative    Oral Cavity Assessment  Within Functional Limits    Oral Care Completed by SLP  No    Oral Cavity - Dentition  Poor condition    Vision  Impaired for self feeding    Self-Feeding Abilities  Needs assist    Patient Positioning  Upright in chair    Baseline  Vocal Quality  Normal    Volitional Cough  Strong    Volitional Swallow  Able to elicit    Anatomy  Within functional limits    Pharyngeal Secretions  Not observed secondary MBS         Oral Preparation/Oral Phase - 06/21/19 1354      Oral Preparation/Oral Phase   Oral Phase  Impaired      Oral - Thin   Oral - Thin Cup  Left anterior bolus loss;Lingual pumping;Decreased bolus cohesion;Piecemeal swallowing      Oral - Solids   Oral - Mechanical Soft  Imparied mastication;Piecemeal swallowing;Oral residue      Electrical stimulation - Oral Phase   Was Electrical Stimulation Used  No       Pharyngeal Phase - 06/21/19 1415      Pharyngeal Phase   Pharyngeal Phase  Impaired      Pharyngeal - Honey   Pharyngeal- Honey Cup  Swallow initiation at  vallecula;Reduced tongue base retraction;Pharyngeal residue - valleculae      Pharyngeal - Nectar   Pharyngeal- Nectar Cup  Delayed swallow initiation;Swallow initiation at pyriform sinus;Reduced airway/laryngeal closure;Reduced tongue base retraction;Penetration/Aspiration during swallow;Pharyngeal residue - valleculae    Pharyngeal  Material enters airway, remains ABOVE vocal cords then ejected out      Pharyngeal - Thin   Pharyngeal- Thin Teaspoon  Swallow initiation at pyriform sinus;Delayed swallow initiation;Reduced epiglottic inversion;Pharyngeal residue - valleculae    Pharyngeal- Thin Cup  Delayed swallow initiation;Swallow initiation at pyriform sinus;Reduced airway/laryngeal closure;Penetration/Aspiration during swallow;Pharyngeal residue - valleculae;Penetration/Aspiration before swallow    Pharyngeal  Material enters airway, remains ABOVE vocal cords then ejected out;Material enters airway, passes BELOW cords without attempt by patient to eject out (silent aspiration)      Pharyngeal - Solids   Pharyngeal- Puree  Swallow initiation at vallecula    Pharyngeal- Mechanical Soft  Swallow initiation at vallecula    Pharyngeal- Pill  Swallow initiation at vallecula      Pharyngeal Phase - Comment   Pharyngeal Comment  trace, silent aspiration of thins noted during study review with cup sip (before the swallow)      Electrical Stimulation - Pharyngeal Phase   Was Electrical Stimulation Used  No       Cricopharyngeal Phase - 06/21/19 1422      Cervical Esophageal Phase   Cervical Esophageal Phase  Within functional limits         Plan - 06/21/19 1424    Clinical Impression Statement  Pt presents with moderate oropharyngeal phase dysphagia in setting of previous CVA with left hemiparesis. Oral phase is marked by reduced labial seal on the left side with occasional labial spillage with liquids, impaired mastication, piecemeal deglutition, and premature spillage. Pharyngeal phase  is characterized by delay in swallow initiation with swallow trigger at the pyriforms with thin and nectar thick liquids (HTL at the valleculae), min reduced epiglottic deflection, reduced tongue base retraction, and reduced laryngeal closure resulting in variable penetration of thins during the swallow (better with tsp presentation) and one episode of trace aspiration of thins before the swallow which was not removed and no cough response. Pt did not masticate the graham cracker when presented in puree and it became transiently delayed near epiglottis and posterior pharyngeal wall. Recommend D2 (or puree per treating SLP) and NTL via cup sips and consider trials of teaspoon presentations of thin with SLP and/or Bascom LevelsFrazier water protocol. Pt will need supervision with meals due to impulsivity. He is  at risk for aspiration due to poor cough, reduced sensation, and cognitive deficits, however risks can be minimized with diet modifications about and feeder assistance.       Patient will benefit from skilled therapeutic intervention in order to improve the following deficits and impairments:   Dysphagia as late effect of cerebrovascular accident (CVA)    Recommendations/Treatment - 06/21/19 1422      Swallow Evaluation Recommendations   SLP Diet Recommendations  Nectar;Dysphagia 2 (chopped)    Liquid Administration via  Cup    Medication Administration  Whole meds with puree    Supervision  Staff to assist with self feeding;Full supervision/cueing for compensatory strategies    Compensations  Small sips/bites;Slow rate;Multiple dry swallows after each bite/sip    Postural Changes  Seated upright at 90 degrees;Remain upright for at least 30 minutes after feeds/meals       Prognosis - 06/21/19 1423      Prognosis   Prognosis for Safe Diet Advancement  Fair    Barriers to Reach Goals  Severity of deficits      Individuals Consulted   Consulted and Agree with Results and Recommendations  Patient     Report Sent to   Referring physician       Problem List Patient Active Problem List   Diagnosis Date Noted  . GERD (gastroesophageal reflux disease) 06/06/2019  . Palliative care by specialist   . Dyspnea and respiratory abnormalities   . Increased oropharyngeal secretions   . Endotracheally intubated--- on ventilator 05/15/2019  . PEG (percutaneous endoscopic gastrostomy) status -Placed 05/15/2019 05/15/2019  . Enterococcus faecalis UTI infection 05/11/2019  . Goals of care, counseling/discussion   . Palliative care encounter   . Bil PNA (pneumonia)--suspect aspiration related 05/09/2019  . Hypernatremia 05/09/2019  . Sepsis secondary to pneumonia 05/09/2019  . Acute hypoxic respiratory failure due to bilateral pneumonia 05/09/2019  . Uncontrolled type 2 diabetes mellitus with neurologic complication, with long-term current use of insulin ([place]) 05/09/2019  . AKI (acute kidney injury) ([place]) 05/09/2019  . HCAP (healthcare-associated pneumonia) 05/04/2019  . Elevated PSA, between 10 and less than 20 ng/ml 02/08/2019  . Aspiration pneumonia of right lower lobe ([place]) 01/30/2019  . 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 Dysphagia due to old cerebrovascular accident-on honey thickened liquids 08/25/2018  . Dyslipidemia 08/25/2018  . Hyperthyroidism 04/05/2017  . Hypertension 10/27/2016  . 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([place]) 10/25/2016  . COPD (chronic obstructive pulmonary disease) ([place]) 10/19/2016  . 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 Long term current use of anticoagulant therapy-on Eliquis due to history of DVT/PE and strokes 10/21/2012  . DVT (deep venous thrombosis) ([place]) 09/26/2012  . Chronic pulmonary embolism ([place]) 09/26/2012  . Aspiration pneumonia of left lower lobe ([place]) 09/26/2012  . Constipation 09/26/2012   Thank  you,  Genene Churn, [place]  Genene Churn 06/21/2019, 2:26 PM  [facility] [address] [place], [place], [address] Phone: [phone]   Fax:  [phone]  Name: [name] [name] MRN: [id number] Date of Birth: 09/03/1945

## 2019-06-25 ENCOUNTER — Ambulatory Visit (HOSPITAL_COMMUNITY): Payer: Medicare Other | Admitting: Speech Pathology

## 2019-06-26 ENCOUNTER — Encounter: Payer: Self-pay | Admitting: Adult Health

## 2019-06-26 ENCOUNTER — Other Ambulatory Visit (HOSPITAL_COMMUNITY): Payer: Medicare Other

## 2019-06-26 ENCOUNTER — Non-Acute Institutional Stay (SKILLED_NURSING_FACILITY): Payer: Medicare Other | Admitting: Adult Health

## 2019-06-26 DIAGNOSIS — E1159 Type 2 diabetes mellitus with other circulatory complications: Secondary | ICD-10-CM | POA: Insufficient documentation

## 2019-06-26 DIAGNOSIS — J410 Simple chronic bronchitis: Secondary | ICD-10-CM | POA: Diagnosis not present

## 2019-06-26 DIAGNOSIS — I10 Essential (primary) hypertension: Secondary | ICD-10-CM | POA: Diagnosis not present

## 2019-06-26 DIAGNOSIS — I69391 Dysphagia following cerebral infarction: Secondary | ICD-10-CM | POA: Diagnosis not present

## 2019-06-26 NOTE — Progress Notes (Signed)
Location:    [facility] Room Number: 112/W Place of Service:  SNF (31)   CODE STATUS: Full Code  Allergies  Allergen Reactions  . Penicillins     Has patient had a PCN reaction causing immediate rash, facial/tongue/throat swelling, SOB or lightheadedness with hypotension: unknown Has patient had a PCN reaction causing severe rash involving mucus membranes or skin necrosis: unknown Has patient had a PCN reaction that required hospitalization: unknown Has patient had a PCN reaction occurring within the last 10 years: unknown If all of the above answers are "NO", then may proceed with Cephalosporin use. 5/3 Tolerated Cefepime x 1 dose in ED.     Chief Complaint  Patient presents with  . Medical Management of Chronic Issues         Simple bronchitis  Dysphagia due to old cerebrovascular accident on honey thick liquids  Hypertension associated with type 2 diabetes mellitus    HPI:  He is a 74 year old long term resident of this facility being seen for the management of his chronic illnesses: bronchitis; dysphagia; hypertension. He chronically aspirates. There are no indications of active infection such as fevers abnormal sputum. There are no reports of changes in appetite; no uncontrolled pain.   Past Medical History:  Diagnosis Date  . Dysphasia   . Fatty liver   . Hemiplegia (HCC)   . Hypertension   . Pneumonia   . Pulmonary embolism (HCC)   . 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(HCC)   . Stroke [facility])     Past Surgical History:  Procedure Laterality Date  . ESOPHAGOGASTRODUODENOSCOPY (EGD) WITH PROPOFOL N/A 05/15/2019   Procedure: ESOPHAGOGASTRODUODENOSCOPY (EGD) WITH PROPOFOL;  Surgeon: Lucretia Roers, MD;  Location: [facility] ORS;  Service: General;  Laterality: N/A;  . FLEXIBLE SIGMOIDOSCOPY N/A 10/21/2016   Procedure: FLEXIBLE SIGMOIDOSCOPY;  Surgeon: Malissa Hippo, MD;  Location: [facility] ENDO SUITE;  Service: Endoscopy;  Laterality: N/A;  . FLEXIBLE SIGMOIDOSCOPY N/A  10/25/2016   Procedure: FLEXIBLE SIGMOIDOSCOPY;  Surgeon: Malissa Hippo, MD;  Location: [facility] ENDO SUITE;  Service: Endoscopy;  Laterality: N/A;  . PEG PLACEMENT N/A 05/15/2019   Procedure: PERCUTANEOUS ENDOSCOPIC GASTROSTOMY (PEG) PLACEMENT;  Surgeon: Lucretia Roers, MD;  Location: [facility] ORS;  Service: General;  Laterality: N/A;  . unable      Social History   Socioeconomic History  . Marital status: Single    Spouse name: Not on file  . Number of children: Not on file  . Years of education: Not on file  . Highest education level: Not on file  Occupational History  . Not on file  Tobacco Use  . Smoking status: Never Smoker  . Smokeless tobacco: Never Used  Substance and Sexual Activity  . Alcohol use: No    Alcohol/week: 0.0 standard drinks  . Drug use: No  . Sexual activity: Not on file  Other Topics Concern  . Not on file  Social History Narrative  . Not on file   Social Determinants of Health   Financial Resource Strain:   . Difficulty of Paying Living Expenses: Not on file  Food Insecurity:   . Worried About Programme researcher, broadcasting/film/video in the Last Year: Not on file  . Ran Out of Food in the Last Year: Not on file  Transportation Needs:   . Lack of Transportation (Medical): Not on file  . Lack of Transportation (Non-Medical): Not on file  Physical Activity:   . Days of Exercise per Week: Not on  file  . Minutes of Exercise per Session: Not on file  Stress:   . Feeling of Stress : Not on file  Social Connections:   . Frequency of Communication with Friends and Family: Not on file  . Frequency of Social Gatherings with Friends and Family: Not on file  . Attends Religious Services: Not on file  . Active Member of Clubs or Organizations: Not on file  . Attends Banker Meetings: Not on file  . Marital Status: Not on file  Intimate Partner Violence:   . Fear of Current or Ex-Partner: Not on file  . Emotionally Abused: Not on file  . Physically Abused: Not on  file  . Sexually Abused: Not on file   No family history on file.    VITAL SIGNS BP 120/78   Pulse 78   Temp (!) 97.2 F (36.2 C) (Oral)   Resp 17   Ht 5' 8.5" (1.74 m)   Wt 220 lb 6.4 oz (100 kg)   SpO2 95%   BMI 33.02 kg/m   Outpatient Encounter Medications as of 06/26/2019  Medication Sig  . acetaminophen (TYLENOL) 325 MG tablet Take 650 mg by mouth every 6 (six) hours as needed for moderate pain.   Marland Kitchen antiseptic oral rinse (BIOTENE) LIQD Special Instructions: clean mouth with swab Every 6 Hours PRN  . antiseptic oral rinse (BIOTENE) LIQD Mucous membrane  Every 6 Hours - PRN  . apixaban (ELIQUIS) 5 MG TABS tablet Place 5 mg into feeding tube 2 (two) times daily. For dvt/pe/cva po  . atorvastatin (LIPITOR) 10 MG tablet Take 10 mg by mouth daily.  Marland Kitchen atropine 1 % ophthalmic solution Place 1 drop under the tongue every 6 (six) hours as needed.  . bisacodyl (DULCOLAX) 10 MG suppository Place 1 suppository (10 mg total) rectally at bedtime.  . budesonide-formoterol (SYMBICORT) 80-4.5 MCG/ACT inhaler Inhale 2 puffs into the lungs 2 (two) times daily.  . carboxymethylcellulose (REFRESH PLUS) 0.5 % SOLN Place 1 drop into the right eye 3 (three) times daily.   Marland Kitchen erythromycin ophthalmic ointment Place into the left eye. appy 1 ribbon ophthalmic (eye) 3 times a day  . Insulin Glargine (BASAGLAR KWIKPEN) 100 UNIT/ML SOPN Inject 25 Units into the skin at bedtime.   . insulin lispro (HUMALOG KWIKPEN) 100 UNIT/ML KwikPen Inject 15 Units into the skin 3 (three) times daily. With tube feedings  . ipratropium-albuterol (DUONEB) 0.5-2.5 (3) MG/3ML SOLN Take 3 mLs by nebulization every 6 (six) hours as needed.  . labetalol (NORMODYNE) 200 MG tablet Take 200 mg by mouth daily. For HTN  . loratadine (CLARITIN) 10 MG tablet Take 10 mg by mouth daily.  . methimazole (TAPAZOLE) 5 MG tablet Take 5 mg by mouth daily.  . NON FORMULARY Diet- Liquids: Nectar Thick Liquids  Diet:Dysphagia 1 Puree  . OXYGEN  Inhale 2 L into the lungs. Every shift  . sennosides (SENOKOT) 8.8 MG/5ML syrup Place 5 mLs into feeding tube 2 (two) times daily as needed for mild constipation.  Marland Kitchen umeclidinium bromide (INCRUSE ELLIPTA) 62.5 MCG/INH AEPB Inhale 1 puff into the lungs daily.  . Water For Irrigation, Sterile (FREE WATER) SOLN Flush G-Tube with 30 ml water Bid  . [DISCONTINUED] apixaban (ELIQUIS) 5 MG TABS tablet Place 1 tablet (5 mg total) into feeding tube 2 (two) times daily.  . [DISCONTINUED] Multiple Vitamins-Minerals (ALIVE MULTI-VITAMIN) LIQD Amt: 15cc; gastric tube  Once A Day 09:00 AM   No facility-administered encounter medications on file as of  06/26/2019.     SIGNIFICANT DIAGNOSTIC EXAMS   PREVIOUS;   01-16-19: chest x-ray: Infiltrate versus atelectasis at LEFT base   05-05-19: chest x-ray: 1. Normal chest x-ray.  2. No tuberculosis is noted   05-08-19: KUB: Findings suggest constipation, fecal impaction or decreased colonic motility. Correlate with risk factors for constipation or fecal stasis, based on frequency or absence of bowel movements. Overall sensitivity is decreased, due to attenuation from patient body habitus.  Soft tissue attenuation results in significantly diminished diagnostic sensitivity and utility.  05-09-19: chest x-ray: Mild bibasilar subsegmental atelectasis or infiltrates are noted   05-09-19: ct of abdomen and pelvis:  1. Moderately large amount of stool in the distal sigmoid colon and rectum. Moderate gaseous distension of the more proximal sigmoid colon without evidence of volvulus or bowel obstruction. 2. Bibasilar lung consolidation concerning for pneumonia. 3. Aortic Atherosclerosis   07-12-18: chest x-ray: Cardiomegaly with low lung volumes and bibasilar collapse/consolidation, left greater than right.  05-16-19: chest x-ray: ETT in good position. Worsened bilateral airspace disease small pleural effusions.  05-19-19: chest x-ray: Partial but incomplete  clearing of opacity from the basis. Patchy airspace opacity remains in the lower lung regions with consolidation in a portion of the left base. There are small pleural effusions bilaterally. Upper lung regions appear clear. Heart upper normal in size. No adenopathy demonstrable by radiography.  NO NEW EXAMS     LABS REVIEWED: PREVIOUS   07-28-18: tsh 0.299; free T3: 2.8; free T4: 0.92 08-17-18: INR 2.5  09-14-18: INR 2.7 10-05-18: INR 3.3 10-12-18: INR 1.9  10-19-18: INR 2.9 10-26-18: wbc 8.8; hgb 15.7; hct 49.2; mcv 95.7; plt 240; glucose 74; bun 25; creat 1.05; k+ 3.9; na++ 141; ca 8.9 INR 2.1  11-27-18: INR 2.7 tsh 0.051 free T3: 3.2; free T4: 1.16 12-11-18: INR 3.3 12-13-18: INR 2.7 12-20-18: INR 2.4 tsh 0.444 free T3: 2.4 free T4: 0.90 01-02-19: INR 2.9  01-09-19: INR 3.5 01-16-19: INR 4.4 01-18-19: INR 3.3  01-23-19: INR 1.4  02-05-19: wbc 9.9; hgb 15.3; hct 482; mcv 96.4; plt 236; glucose 99; bun 46; creat 1.54; k+ 4.0; na++ 139; ca 8.7; liver normal albumin 3.2; chol 106; ldl 62; trig 54; hdl 33; PSA 10.58 (scheduled for 03-31-19)  03-16-19: tsh 2.668 free T4: 0.81 05-01-19: PSA 11.82 05-05-19: wbc 12.3; hgb 16.7; hct 53.4; mcv 98.2 plt 246; blood culture: no growth 05-08-19: PSA 16.39 05-09-19: wbc 13.3; hgb 16.9; hct 56.5; mcv 102.7 plt 261; glucose 689; bun 82; creat 2.78; k+ 5.3; na++ 151; ca 8.9; urine culture: enterococcus faecalis:  Hgb a1c 11.3 05-11-19: glucose 98; bun 36; creat 1.61; k+ 3.8; na++ 158; ca 7.8; phos 2.0 albumin 2.6 05-15-19: wbc 8.2; hgb 12.9; hct 42.2; mcv 100.2 plt 165; glucose 178; bun 15; creat 1.37; k+ 3.5; na++ 143; ca 7.5  05-18-19: wbc 7.8; hgb 13.3; hct 43.2; mcv 99.1 plt 227; glucose 246; bun 28; creat 1.21; k+ 3.2; na++ 139 ca 7.7 05-20-19: wbc 9.5; hgb 13.3; hct 44.0; mcv 98.9 plt 264; glucose 190; bun 25; creat 1.10; k+ 3.7; na++ 138; ca 8.3 ;liver normal albumin 2.4   NO NEW LABS.   Review of Systems  Constitutional: Negative for malaise/fatigue.    Respiratory: Negative for cough and shortness of breath.   Cardiovascular: Negative for chest pain, palpitations and leg swelling.  Gastrointestinal: Negative for abdominal pain, constipation and heartburn.  Musculoskeletal: Negative for back pain, joint pain and myalgias.  Skin: Negative.   Neurological: Negative for  dizziness.  Psychiatric/Behavioral: The patient is not nervous/anxious.     Physical Exam Constitutional:      General: He is not in acute distress.    Appearance: He is well-developed. He is obese. He is not diaphoretic.  Neck:     Thyroid: No thyromegaly.  Cardiovascular:     Rate and Rhythm: Normal rate and regular rhythm.     Heart sounds: Normal heart sounds.  Pulmonary:     Effort: Pulmonary effort is normal. No respiratory distress.     Breath sounds: Normal breath sounds.  Abdominal:     General: Bowel sounds are normal. There is no distension.     Palpations: Abdomen is soft.     Tenderness: There is no abdominal tenderness.     Comments: Peg tube present   Musculoskeletal:     Cervical back: Neck supple.     Right lower leg: No edema.     Left lower leg: No edema.     Comments: Right hemiplegia Is able to move left extremities  Lymphadenopathy:     Cervical: No cervical adenopathy.  Skin:    General: Skin is warm and dry.  Neurological:     Mental Status: He is alert. Mental status is at baseline.  Psychiatric:        Mood and Affect: Mood normal.       ASSESSMENT/ PLAN:  TODAY:  1. Simple bronchitis is stable is 02 dependent: will continue symbicort 80/4.5 mcg 2 puffs twice daily claritin 10 mg daily incruse elipta 62.5 mcg 1 puff daily   2. Dysphagia due to old cerebrovascular accident on honey thick liquids/increased oral secretions: is without change; does chronically aspirate. Is on honey thick liquids per his choice. Has atropine drops as needed for oral secretions.   3. Hypertension associated with type 2 diabetes mellitus: is  stable b/p 120/78 will continue labetalol 200 mg daily will begin lisinopril 2.5 mg daily due to his diabetes and will monitor his status.    PREVIOUS   4. 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 Cerebrovascular accident (CVA) due to other mechanism/hemiplegia or right dominant side due to cerebrovascular disease: is stable will continue eilqius 5 mg twice daily   6. Dyslipidemia: is stable LDL 54 will continue lipitor 10 mg daily   7. Hyperthyroidism: is stable tsh 0.051 free T3: 3.2 free T4: 1.16; will continue tapazole 5 mg daily   8. Deep vein thrombosis (DVT) of left extremity unspecified vein/other chronic pulmonary embolism without acute cor pulmonale: is stable is on long term eliquis 5 mg twice daily   9. Type 2 diabetes mellitus new onset with long term current use of insulin: hgb a1c 11.3 is stable will continue basaglar 25 units nightly and humalog 15 units with meals   Will check lipids and urine microalbumin bmp   MD is aware of resident's narcotic use and is in agreement with current plan of care. We will attempt to wean resident as appropriate.  Ok Edwards NP [facility]  Contact [phone] Monday through Friday 8am- 5pm  After hours call [phone]

## 2019-06-27 ENCOUNTER — Non-Acute Institutional Stay (SKILLED_NURSING_FACILITY): Payer: Medicare Other | Admitting: Adult Health

## 2019-06-27 ENCOUNTER — Encounter: Payer: Self-pay | Admitting: Adult Health

## 2019-06-27 DIAGNOSIS — J410 Simple chronic bronchitis: Secondary | ICD-10-CM

## 2019-06-27 DIAGNOSIS — Z23 Encounter for immunization: Secondary | ICD-10-CM | POA: Diagnosis not present

## 2019-06-27 DIAGNOSIS — G8191 Hemiplegia, unspecified affecting right dominant side: Secondary | ICD-10-CM | POA: Diagnosis not present

## 2019-06-27 DIAGNOSIS — I679 Cerebrovascular disease, unspecified: Secondary | ICD-10-CM | POA: Diagnosis not present

## 2019-06-27 DIAGNOSIS — I69391 Dysphagia following cerebral infarction: Secondary | ICD-10-CM | POA: Diagnosis not present

## 2019-06-27 NOTE — Progress Notes (Signed)
Location:    [facility] Room Number: 112/W Place of Service:  SNF (31)   CODE STATUS: Full Code  Allergies  Allergen Reactions   Penicillins     Has patient had a PCN reaction causing immediate rash, facial/tongue/throat swelling, SOB or lightheadedness with hypotension: unknown Has patient had a PCN reaction causing severe rash involving mucus membranes or skin necrosis: unknown Has patient had a PCN reaction that required hospitalization: unknown Has patient had a PCN reaction occurring within the last 10 years: unknown If all of the above answers are "NO", then may proceed with Cephalosporin use. 5/3 Tolerated Cefepime x 1 dose in ED.    Chief Complaint  Patient presents with   Acute Visit    care plan meeting      HPI:  We have come together for his care plan meeting. BIMS 13/15 mood 0/30. There is family present. He has lost 10 pounds to his current weight os 220 pounds. We have ordered a palliative care consult for him. He continues to aspirate; is on pureed and honey thick liquids. He needs to have advanced directives discussion with the palliative care provider. There are no reports of uncontrolled pain; no falls; no anxiety or agitation present. He continues to be followed for his chronic illnesses including: hemiplegia; bronchitis; dysphagia.   Past Medical History:  Diagnosis Date   Dysphasia    Fatty liver    Hemiplegia ([place])    Hypertension    Pneumonia    Pulmonary embolism ([place])    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([place])    Stroke [facility])     Past Surgical History:  Procedure Laterality Date   ESOPHAGOGASTRODUODENOSCOPY (EGD) WITH PROPOFOL N/A 05/15/2019   Procedure: ESOPHAGOGASTRODUODENOSCOPY (EGD) WITH PROPOFOL;  Surgeon: Virl Cagey, MD;  Location: [facility] ORS;  Service: General;  Laterality: N/A;   FLEXIBLE SIGMOIDOSCOPY N/A 10/21/2016   Procedure: FLEXIBLE SIGMOIDOSCOPY;  Surgeon: Rogene Houston, MD;  Location: [facility] ENDO SUITE;   Service: Endoscopy;  Laterality: N/A;   FLEXIBLE SIGMOIDOSCOPY N/A 10/25/2016   Procedure: FLEXIBLE SIGMOIDOSCOPY;  Surgeon: Rogene Houston, MD;  Location: [facility] ENDO SUITE;  Service: Endoscopy;  Laterality: N/A;   PEG PLACEMENT N/A 05/15/2019   Procedure: PERCUTANEOUS ENDOSCOPIC GASTROSTOMY (PEG) PLACEMENT;  Surgeon: Virl Cagey, MD;  Location: [facility] ORS;  Service: General;  Laterality: N/A;   unable      Social History   Socioeconomic History   Marital status: Single    Spouse name: Not on file   Number of children: Not on file   Years of education: Not on file   Highest education level: Not on file  Occupational History   Not on file  Tobacco Use   Smoking status: Never Smoker   Smokeless tobacco: Never Used  Substance and Sexual Activity   Alcohol use: No    Alcohol/week: 0.0 standard drinks   Drug use: No   Sexual activity: Not on file  Other Topics Concern   Not on file  Social History Narrative   Not on file   Social Determinants of Health   Financial Resource Strain:    Difficulty of Paying Living Expenses: Not on file  Food Insecurity:    Worried About [place] in the Last Year: Not on file   Ran Out of Food in the Last Year: Not on file  Transportation Needs:    Lack of Transportation (Medical): Not on file   Lack of Transportation (Non-Medical): Not on file  Physical Activity:    Days of Exercise per Week: Not on file   Minutes of Exercise per Session: Not on file  Stress:    Feeling of Stress : Not on file  Social Connections:    Frequency of Communication with Friends and Family: Not on file   Frequency of Social Gatherings with Friends and Family: Not on file   Attends Religious Services: Not on file   Active Member of Clubs or Organizations: Not on file   Attends Banker Meetings: Not on file   Marital Status: Not on file  Intimate Partner Violence:    Fear of Current or Ex-Partner: Not on file    Emotionally Abused: Not on file   Physically Abused: Not on file   Sexually Abused: Not on file   No family history on file.    VITAL SIGNS BP 133/82    Pulse 77    Temp 98.3 F (36.8 C) (Oral)    Resp 17    Ht 5' 8.5" (1.74 m)    Wt 220 lb 6.4 oz (100 kg)    SpO2 92%    BMI 33.02 kg/m   Outpatient Encounter Medications as of 06/27/2019  Medication Sig   acetaminophen (TYLENOL) 325 MG tablet Take 650 mg by mouth every 6 (six) hours as needed for moderate pain.    antiseptic oral rinse (BIOTENE) LIQD Special Instructions: clean mouth with swab Every 6 Hours PRN   antiseptic oral rinse (BIOTENE) LIQD Mucous membrane  Every 6 Hours - PRN   apixaban (ELIQUIS) 5 MG TABS tablet Place 5 mg into feeding tube 2 (two) times daily. For dvt/pe/cva po   atorvastatin (LIPITOR) 10 MG tablet Take 10 mg by mouth daily.   atropine 1 % ophthalmic solution Place 1 drop under the tongue every 6 (six) hours as needed.   bisacodyl (DULCOLAX) 10 MG suppository Place 1 suppository (10 mg total) rectally at bedtime.   budesonide-formoterol (SYMBICORT) 80-4.5 MCG/ACT inhaler Inhale 2 puffs into the lungs 2 (two) times daily.   carboxymethylcellulose (REFRESH PLUS) 0.5 % SOLN Place 1 drop into the right eye 3 (three) times daily.    erythromycin ophthalmic ointment Place into the left eye. appy 1 ribbon ophthalmic (eye) 3 times a day   Insulin Glargine (BASAGLAR KWIKPEN) 100 UNIT/ML SOPN Inject 25 Units into the skin at bedtime.    insulin lispro (HUMALOG KWIKPEN) 100 UNIT/ML KwikPen Inject 15 Units into the skin 3 (three) times daily. With tube feedings   ipratropium-albuterol (DUONEB) 0.5-2.5 (3) MG/3ML SOLN Take 3 mLs by nebulization every 6 (six) hours as needed.   labetalol (NORMODYNE) 200 MG tablet Take 200 mg by mouth daily. For HTN   lisinopril (ZESTRIL) 2.5 MG tablet Take 2.5 mg by mouth daily.   loratadine (CLARITIN) 10 MG tablet Take 10 mg by mouth daily.   methimazole (TAPAZOLE)  5 MG tablet Take 5 mg by mouth daily.   NON FORMULARY Diet- Liquids: Nectar Thick Liquids  Diet:Dysphagia 1 Puree   OXYGEN Inhale 2 L into the lungs. Every shift   sennosides (SENOKOT) 8.8 MG/5ML syrup Place 5 mLs into feeding tube 2 (two) times daily as needed for mild constipation.   umeclidinium bromide (INCRUSE ELLIPTA) 62.5 MCG/INH AEPB Inhale 1 puff into the lungs daily.   Water For Irrigation, Sterile (FREE WATER) SOLN Flush G-Tube with 30 ml water Bid   No facility-administered encounter medications on file as of 06/27/2019.     SIGNIFICANT DIAGNOSTIC EXAMS  PREVIOUS;   01-16-19: chest x-ray: Infiltrate versus atelectasis at LEFT base   05-05-19: chest x-ray: 1. Normal chest x-ray.  2. No tuberculosis is noted   05-08-19: KUB: Findings suggest constipation, fecal impaction or decreased colonic motility. Correlate with risk factors for constipation or fecal stasis, based on frequency or absence of bowel movements. Overall sensitivity is decreased, due to attenuation from patient body habitus.  Soft tissue attenuation results in significantly diminished diagnostic sensitivity and utility.  05-09-19: chest x-ray: Mild bibasilar subsegmental atelectasis or infiltrates are noted   05-09-19: ct of abdomen and pelvis:  1. Moderately large amount of stool in the distal sigmoid colon and rectum. Moderate gaseous distension of the more proximal sigmoid colon without evidence of volvulus or bowel obstruction. 2. Bibasilar lung consolidation concerning for pneumonia. 3. Aortic Atherosclerosis   07-12-18: chest x-ray: Cardiomegaly with low lung volumes and bibasilar collapse/consolidation, left greater than right.  05-16-19: chest x-ray: ETT in good position. Worsened bilateral airspace disease small pleural effusions.  05-19-19: chest x-ray: Partial but incomplete clearing of opacity from the basis. Patchy airspace opacity remains in the lower lung regions with consolidation in  a portion of the left base. There are small pleural effusions bilaterally. Upper lung regions appear clear. Heart upper normal in size. No adenopathy demonstrable by radiography.  NO NEW EXAMS     LABS REVIEWED: PREVIOUS   07-28-18: tsh 0.299; free T3: 2.8; free T4: 0.92 08-17-18: INR 2.5  09-14-18: INR 2.7 10-05-18: INR 3.3 10-12-18: INR 1.9  10-19-18: INR 2.9 10-26-18: wbc 8.8; hgb 15.7; hct 49.2; mcv 95.7; plt 240; glucose 74; bun 25; creat 1.05; k+ 3.9; na++ 141; ca 8.9 INR 2.1  11-27-18: INR 2.7 tsh 0.051 free T3: 3.2; free T4: 1.16 12-11-18: INR 3.3 12-13-18: INR 2.7 12-20-18: INR 2.4 tsh 0.444 free T3: 2.4 free T4: 0.90 01-02-19: INR 2.9  01-09-19: INR 3.5 01-16-19: INR 4.4 01-18-19: INR 3.3  01-23-19: INR 1.4  02-05-19: wbc 9.9; hgb 15.3; hct 482; mcv 96.4; plt 236; glucose 99; bun 46; creat 1.54; k+ 4.0; na++ 139; ca 8.7; liver normal albumin 3.2; chol 106; ldl 62; trig 54; hdl 33; PSA 10.58 (scheduled for 03-31-19)  03-16-19: tsh 2.668 free T4: 0.81 05-01-19: PSA 11.82 05-05-19: wbc 12.3; hgb 16.7; hct 53.4; mcv 98.2 plt 246; blood culture: no growth 05-08-19: PSA 16.39 05-09-19: wbc 13.3; hgb 16.9; hct 56.5; mcv 102.7 plt 261; glucose 689; bun 82; creat 2.78; k+ 5.3; na++ 151; ca 8.9; urine culture: enterococcus faecalis:  Hgb a1c 11.3 05-11-19: glucose 98; bun 36; creat 1.61; k+ 3.8; na++ 158; ca 7.8; phos 2.0 albumin 2.6 05-15-19: wbc 8.2; hgb 12.9; hct 42.2; mcv 100.2 plt 165; glucose 178; bun 15; creat 1.37; k+ 3.5; na++ 143; ca 7.5  05-18-19: wbc 7.8; hgb 13.3; hct 43.2; mcv 99.1 plt 227; glucose 246; bun 28; creat 1.21; k+ 3.2; na++ 139 ca 7.7 05-20-19: wbc 9.5; hgb 13.3; hct 44.0; mcv 98.9 plt 264; glucose 190; bun 25; creat 1.10; k+ 3.7; na++ 138; ca 8.3 ;liver normal albumin 2.4   NO NEW LABS.   Review of Systems  Constitutional: Negative for malaise/fatigue.  Respiratory: Negative for cough and shortness of breath.   Cardiovascular: Negative for chest pain, palpitations and leg  swelling.  Gastrointestinal: Negative for abdominal pain, constipation and heartburn.  Musculoskeletal: Negative for back pain, joint pain and myalgias.  Skin: Negative.   Neurological: Negative for dizziness.  Psychiatric/Behavioral: The patient is not nervous/anxious.  Physical Exam Constitutional:      General: He is not in acute distress.    Appearance: He is well-developed. He is obese. He is not diaphoretic.  Neck:     Thyroid: No thyromegaly.  Cardiovascular:     Rate and Rhythm: Normal rate and regular rhythm.     Pulses: Normal pulses.     Heart sounds: Normal heart sounds.  Pulmonary:     Effort: Pulmonary effort is normal. No respiratory distress.     Breath sounds: Normal breath sounds.  Abdominal:     General: Bowel sounds are normal. There is no distension.     Palpations: Abdomen is soft.     Tenderness: There is no abdominal tenderness.     Comments: Peg tube present   Musculoskeletal:     Cervical back: Neck supple.     Right lower leg: No edema.     Left lower leg: No edema.     Comments:  Right hemiplegia Is able to move left extremities   Lymphadenopathy:     Cervical: No cervical adenopathy.  Skin:    General: Skin is warm and dry.  Neurological:     Mental Status: He is alert. Mental status is at baseline.  Psychiatric:        Mood and Affect: Mood normal.        ASSESSMENT/ PLAN:  TODAY  1. 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 Simple chronic bronchitis 3. 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 We will attempt to wean resident as appropriate.  Synthia Innocent NP [facility]  Contact [phone] Monday through Friday 8am- 5pm  After hours call [phone]

## 2019-07-02 ENCOUNTER — Encounter: Payer: Self-pay | Admitting: Adult Health

## 2019-07-02 ENCOUNTER — Non-Acute Institutional Stay (SKILLED_NURSING_FACILITY): Payer: Medicare Other | Admitting: Adult Health

## 2019-07-02 DIAGNOSIS — H1031 Unspecified acute conjunctivitis, right eye: Secondary | ICD-10-CM

## 2019-07-02 NOTE — Progress Notes (Signed)
Location:    [place] Room Number: 628B Place of Service:  SNF (31) Phillips Grout NP    CODE STATUS: FULL  Allergies  Allergen Reactions  . Penicillins     Has patient had a PCN reaction causing immediate rash, facial/tongue/throat swelling, SOB or lightheadedness with hypotension: unknown Has patient had a PCN reaction causing severe rash involving mucus membranes or skin necrosis: unknown Has patient had a PCN reaction that required hospitalization: unknown Has patient had a PCN reaction occurring within the last 10 years: unknown If all of the above answers are "NO", then may proceed with Cephalosporin use. 5/3 Tolerated Cefepime x 1 dose in ED.     Chief Complaint  Patient presents with  . Acute Visit    Eye Drainage     HPI:  His right eye remains red and inflamed. He denies any pain or itching in the right eye. He denies any vision changes. There is no drainage present.   Past Medical History:  Diagnosis Date  . Dysphasia   . Fatty liver   . Hemiplegia ([place])   . Hypertension   . Pneumonia   . Pulmonary embolism ([place])   . 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([place])   . Stroke [facility])     Past Surgical History:  Procedure Laterality Date  . ESOPHAGOGASTRODUODENOSCOPY (EGD) WITH PROPOFOL N/A 05/15/2019   Procedure: ESOPHAGOGASTRODUODENOSCOPY (EGD) WITH PROPOFOL;  Surgeon: Virl Cagey, MD;  Location: [facility] ORS;  Service: General;  Laterality: N/A;  . FLEXIBLE SIGMOIDOSCOPY N/A 10/21/2016   Procedure: FLEXIBLE SIGMOIDOSCOPY;  Surgeon: Rogene Houston, MD;  Location: [facility] ENDO SUITE;  Service: Endoscopy;  Laterality: N/A;  . FLEXIBLE SIGMOIDOSCOPY N/A 10/25/2016   Procedure: FLEXIBLE SIGMOIDOSCOPY;  Surgeon: Rogene Houston, MD;  Location: [facility] SU[facility];  Service: Endoscopy;  Laterality: N/A;  . PEG PLACEMENT N/A 05/15/2019   Procedure: PERCUTANEOUS ENDOSCOPIC GASTROSTOMY (PEG) PLACEMENT;  Surgeon: Virl Cagey, MD;  Location: [facility] ORS;  Service: General;   Laterality: N/A;  . unable      Social History   Socioeconomic History  . Marital status: Single    Spouse name: Not on file  . Number of children: Not on file  . Years of education: Not on file  . Highest education level: Not on file  Occupational History  . Not on file  Tobacco Use  . Smoking status: Never Smoker  . Smokeless tobacco: Never Used  Substance and Sexual Activity  . Alcohol use: No    Alcohol/week: 0.0 standard drinks  . Drug use: No  . Sexual activity: Not on file  Other Topics Concern  . Not on file  Social History Narrative  . Not on file   Social Determinants of Health   Financial Resource Strain:   . Difficulty of Paying Living Expenses: Not on file  Food Insecurity:   . Worried About Charity fundraiser in the Last Year: Not on file  . Ran Out of Food in the Last Year: Not on file  Transportation Needs:   . Lack of Transportation (Medical): Not on file  . Lack of Transportation (Non-Medical): Not on file  Physical Activity:   . Days of Exercise per Week: Not on file  . Minutes of Exercise per Session: Not on file  Stress:   . Feeling of Stress : Not on file  Social Connections:   . Frequency of Communication with Friends and Family: Not on file  . Frequency of Social Gatherings  with Friends and Family: Not on file  . Attends Religious Services: Not on file  . Active Member of Clubs or Organizations: Not on file  . Attends Banker Meetings: Not on file  . Marital Status: Not on file  Intimate Partner Violence:   . Fear of Current or Ex-Partner: Not on file  . Emotionally Abused: Not on file  . Physically Abused: Not on file  . Sexually Abused: Not on file   History reviewed. No pertinent family history.    VITAL SIGNS BP 139/89   Pulse 80   Temp 98 F (36.7 C) (Oral)   Resp 20   Ht 5' 8.5" (1.74 m)   Wt 220 lb 6.4 oz (100 kg)   SpO2 94%   BMI 33.02 kg/m   Outpatient Encounter Medications as of 07/02/2019    Medication Sig  . acetaminophen (TYLENOL) 325 MG tablet Take 650 mg by mouth every 6 (six) hours as needed for moderate pain.   Marland Kitchen antiseptic oral rinse (BIOTENE) LIQD Special Instructions: clean mouth with swab Every 6 Hours PRN  . antiseptic oral rinse (BIOTENE) LIQD Mucous membrane  Every 6 Hours - PRN  . apixaban (ELIQUIS) 5 MG TABS tablet Place 5 mg into feeding tube 2 (two) times daily. For dvt/pe/cva po  . atorvastatin (LIPITOR) 10 MG tablet Take 10 mg by mouth daily.  Marland Kitchen atropine 1 % ophthalmic solution Place 1 drop under the tongue every 6 (six) hours as needed.  . bisacodyl (DULCOLAX) 10 MG suppository Place 1 suppository (10 mg total) rectally at bedtime.  . budesonide-formoterol (SYMBICORT) 80-4.5 MCG/ACT inhaler Inhale 2 puffs into the lungs 2 (two) times daily.  . carboxymethylcellulose (REFRESH PLUS) 0.5 % SOLN Place 1 drop into the right eye 3 (three) times daily.   Marland Kitchen erythromycin ophthalmic ointment Place into the left eye. appy 1 ribbon ophthalmic (eye) 3 times a day  . Insulin Glargine (BASAGLAR KWIKPEN) 100 UNIT/ML SOPN Inject 25 Units into the skin at bedtime.   . insulin lispro (HUMALOG KWIKPEN) 100 UNIT/ML KwikPen Inject 15 Units into the skin 3 (three) times daily. With tube feedings  . ipratropium-albuterol (DUONEB) 0.5-2.5 (3) MG/3ML SOLN Take 3 mLs by nebulization every 6 (six) hours as needed.  . labetalol (NORMODYNE) 200 MG tablet Take 200 mg by mouth daily. For HTN  . lisinopril (ZESTRIL) 2.5 MG tablet Take 2.5 mg by mouth daily.  Marland Kitchen loratadine (CLARITIN) 10 MG tablet Take 10 mg by mouth daily.  . methimazole (TAPAZOLE) 5 MG tablet Take 5 mg by mouth daily.  . NON FORMULARY Diet- Liquids: Nectar Thick Liquids  Diet:Dysphagia 1 Puree  . OXYGEN Inhale 2 L into the lungs. Every shift  . sennosides (SENOKOT) 8.8 MG/5ML syrup Place 5 mLs into feeding tube 2 (two) times daily as needed for mild constipation.  Marland Kitchen umeclidinium bromide (INCRUSE ELLIPTA) 62.5 MCG/INH AEPB  Inhale 1 puff into the lungs daily.  . Water For Irrigation, Sterile (FREE WATER) SOLN Flush G-Tube with 30 ml water Bid   No facility-administered encounter medications on file as of 07/02/2019.     SIGNIFICANT DIAGNOSTIC EXAMS   PREVIOUS;   01-16-19: chest x-ray: Infiltrate versus atelectasis at LEFT base   05-05-19: chest x-ray: 1. Normal chest x-ray.  2. No tuberculosis is noted   05-08-19: KUB: Findings suggest constipation, fecal impaction or decreased colonic motility. Correlate with risk factors for constipation or fecal stasis, based on frequency or absence of bowel movements. Overall sensitivity is decreased,  due to attenuation from patient body habitus.  Soft tissue attenuation results in significantly diminished diagnostic sensitivity and utility.  05-09-19: chest x-ray: Mild bibasilar subsegmental atelectasis or infiltrates are noted   05-09-19: ct of abdomen and pelvis:  1. Moderately large amount of stool in the distal sigmoid colon and rectum. Moderate gaseous distension of the more proximal sigmoid colon without evidence of volvulus or bowel obstruction. 2. Bibasilar lung consolidation concerning for pneumonia. 3. Aortic Atherosclerosis   07-12-18: chest x-ray: Cardiomegaly with low lung volumes and bibasilar collapse/consolidation, left greater than right.  05-16-19: chest x-ray: ETT in good position. Worsened bilateral airspace disease small pleural effusions.  05-19-19: chest x-ray: Partial but incomplete clearing of opacity from the basis. Patchy airspace opacity remains in the lower lung regions with consolidation in a portion of the left base. There are small pleural effusions bilaterally. Upper lung regions appear clear. Heart upper normal in size. No adenopathy demonstrable by radiography.  NO NEW EXAMS     LABS REVIEWED: PREVIOUS   07-28-18: tsh 0.299; free T3: 2.8; free T4: 0.92 08-17-18: INR 2.5  09-14-18: INR 2.7 10-05-18: INR 3.3 10-12-18: INR 1.9    10-19-18: INR 2.9 10-26-18: wbc 8.8; hgb 15.7; hct 49.2; mcv 95.7; plt 240; glucose 74; bun 25; creat 1.05; k+ 3.9; na++ 141; ca 8.9 INR 2.1  11-27-18: INR 2.7 tsh 0.051 free T3: 3.2; free T4: 1.16 12-11-18: INR 3.3 12-13-18: INR 2.7 12-20-18: INR 2.4 tsh 0.444 free T3: 2.4 free T4: 0.90 01-02-19: INR 2.9  01-09-19: INR 3.5 01-16-19: INR 4.4 01-18-19: INR 3.3  01-23-19: INR 1.4  02-05-19: wbc 9.9; hgb 15.3; hct 482; mcv 96.4; plt 236; glucose 99; bun 46; creat 1.54; k+ 4.0; na++ 139; ca 8.7; liver normal albumin 3.2; chol 106; ldl 62; trig 54; hdl 33; PSA 10.58 (scheduled for 03-31-19)  03-16-19: tsh 2.668 free T4: 0.81 05-01-19: PSA 11.82 05-05-19: wbc 12.3; hgb 16.7; hct 53.4; mcv 98.2 plt 246; blood culture: no growth 05-08-19: PSA 16.39 05-09-19: wbc 13.3; hgb 16.9; hct 56.5; mcv 102.7 plt 261; glucose 689; bun 82; creat 2.78; k+ 5.3; na++ 151; ca 8.9; urine culture: enterococcus faecalis:  Hgb a1c 11.3 05-11-19: glucose 98; bun 36; creat 1.61; k+ 3.8; na++ 158; ca 7.8; phos 2.0 albumin 2.6 05-15-19: wbc 8.2; hgb 12.9; hct 42.2; mcv 100.2 plt 165; glucose 178; bun 15; creat 1.37; k+ 3.5; na++ 143; ca 7.5  05-18-19: wbc 7.8; hgb 13.3; hct 43.2; mcv 99.1 plt 227; glucose 246; bun 28; creat 1.21; k+ 3.2; na++ 139 ca 7.7 05-20-19: wbc 9.5; hgb 13.3; hct 44.0; mcv 98.9 plt 264; glucose 190; bun 25; creat 1.10; k+ 3.7; na++ 138; ca 8.3 ;liver normal albumin 2.4   NO NEW LABS.   Review of Systems  Constitutional: Negative for malaise/fatigue.  Eyes:       Right eye red   Respiratory: Negative for cough and shortness of breath.   Cardiovascular: Negative for chest pain, palpitations and leg swelling.  Gastrointestinal: Negative for abdominal pain, constipation and heartburn.  Musculoskeletal: Negative for back pain, joint pain and myalgias.  Skin: Negative.   Neurological: Negative for dizziness.  Psychiatric/Behavioral: The patient is not nervous/anxious.     Physical Exam Constitutional:       General: He is not in acute distress.    Appearance: He is well-developed. He is obese. He is not diaphoretic.  Eyes:     Comments: Right eye is red and inflamed without drainage present  Neck:     Thyroid: No thyromegaly.  Cardiovascular:     Rate and Rhythm: Normal rate and regular rhythm.     Pulses: Normal pulses.     Heart sounds: Normal heart sounds.  Pulmonary:     Effort: Pulmonary effort is normal. No respiratory distress.     Breath sounds: Normal breath sounds.  Abdominal:     General: Bowel sounds are normal. There is no distension.     Palpations: Abdomen is soft.     Tenderness: There is no abdominal tenderness.     Comments: Peg tube present   Musculoskeletal:     Cervical back: Neck supple.     Right lower leg: No edema.     Left lower leg: No edema.     Comments:  Right hemiplegia Is able to move left extremities    Lymphadenopathy:     Cervical: No cervical adenopathy.  Skin:    General: Skin is warm and dry.  Neurological:     Mental Status: He is alert. Mental status is at baseline.  Psychiatric:        Mood and Affect: Mood normal.        ASSESSMENT/ PLAN:  TODAY  1. Right eye conjunctivitis: is without change: will begin oflaxacin 0.3% 2 drops right eye four times daily through 07-09-19     MD is aware of resident's narcotic use and is in agreement with current plan of care. We will attempt to wean resident as appropriate.  Synthia Innocent NP [facility]  Contact [phone] Monday through Friday 8am- 5pm  After hours call [phone]

## 2019-07-03 DIAGNOSIS — H1031 Unspecified acute conjunctivitis, right eye: Secondary | ICD-10-CM | POA: Insufficient documentation

## 2019-07-04 ENCOUNTER — Other Ambulatory Visit (HOSPITAL_COMMUNITY)
Admission: RE | Admit: 2019-07-04 | Discharge: 2019-07-04 | Disposition: A | Discharge status: Home / Self Care | Payer: Medicare Other | Source: Skilled Nursing Facility | Attending: Adult Health | Admitting: Adult Health

## 2019-07-04 DIAGNOSIS — E1165 Type 2 diabetes mellitus with hyperglycemia: Secondary | ICD-10-CM | POA: Insufficient documentation

## 2019-07-04 LAB — LIPID PANEL
Cholesterol: 109 mg/dL (ref 0–200)
HDL: 30 mg/dL — ABNORMAL LOW (ref >40)
LDL Cholesterol: 68 mg/dL (ref 0–99)
Total CHOL/HDL Ratio: 3.6 RATIO
Triglycerides: 57 mg/dL (ref <150)
VLDL: 11 mg/dL (ref 0–40)

## 2019-07-04 LAB — BASIC METABOLIC PANEL
Anion gap: 10 (ref 5–15)
BUN: 31 mg/dL — ABNORMAL HIGH (ref 8–23)
CO2: 30 mmol/L (ref 22–32)
Calcium: 8.8 mg/dL — ABNORMAL LOW (ref 8.9–10.3)
Chloride: 103 mmol/L (ref 98–111)
Creatinine, Ser: 1.19 mg/dL (ref 0.61–1.24)
GFR calc Af Amer: >60 mL/min (ref >60)
GFR calc non Af Amer: >60 mL/min (ref >60)
Glucose, Bld: 99 mg/dL (ref 70–99)
Potassium: 3.8 mmol/L (ref 3.5–5.1)
Sodium: 143 mmol/L (ref 135–145)

## 2019-07-04 LAB — MICROALBUMIN, URINE: Microalb, Ur: 33.8

## 2019-07-05 ENCOUNTER — Other Ambulatory Visit (HOSPITAL_COMMUNITY)
Admission: RE | Admit: 2019-07-05 | Discharge: 2019-07-05 | Disposition: A | Discharge status: Home / Self Care | Payer: Medicare Other | Source: Skilled Nursing Facility | Attending: Internal Medicine | Admitting: Internal Medicine

## 2019-07-05 DIAGNOSIS — E0590 Thyrotoxicosis, unspecified without thyrotoxic crisis or storm: Secondary | ICD-10-CM | POA: Diagnosis not present

## 2019-07-05 DIAGNOSIS — E1165 Type 2 diabetes mellitus with hyperglycemia: Secondary | ICD-10-CM | POA: Diagnosis not present

## 2019-07-05 LAB — MICROALBUMIN, URINE: Microalb, Ur: 33.8 ug/mL — ABNORMAL HIGH

## 2019-07-05 LAB — TSH: TSH: 1.257 u[IU]/mL (ref 0.350–4.500)

## 2019-07-05 LAB — T4, FREE: Free T4: 0.66 ng/dL (ref 0.61–1.12)

## 2019-07-09 ENCOUNTER — Encounter: Payer: Self-pay | Admitting: "Endocrinology

## 2019-07-09 ENCOUNTER — Ambulatory Visit (INDEPENDENT_AMBULATORY_CARE_PROVIDER_SITE_OTHER): Payer: Medicare Other | Admitting: "Endocrinology

## 2019-07-09 DIAGNOSIS — E0590 Thyrotoxicosis, unspecified without thyrotoxic crisis or storm: Secondary | ICD-10-CM

## 2019-07-09 NOTE — Progress Notes (Signed)
07/09/2019                                 Endocrinology Telehealth Visit Follow up Note -During COVID -19 Pandemic  I connected with [name] [name] on 07/09/2019   by telephone and verified that I am speaking with the correct person using two identifiers. [name] [name], [birth year]. he has verbally consented to this visit. All issues noted in this document were discussed and addressed. The format was not optimal for physical exam   Subjective:    Patient ID: [name] [name], male    DOB: [birth year], PCP [name] Hanks, MD   Past Medical History:  Diagnosis Date  . Dysphasia   . Fatty liver   . Hemiplegia (HCC)   . Hypertension   . Pneumonia   . Pulmonary embolism (HCC)   . 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(HCC)   . Stroke [name] [name])    Past Surgical History:  Procedure Laterality Date  . ESOPHAGOGASTRODUODENOSCOPY (EGD) WITH PROPOFOL N/A 05/15/2019   Procedure: ESOPHAGOGASTRODUODENOSCOPY (EGD) WITH PROPOFOL;  Surgeon: [name] Roers, MD;  Location: [facility];  Service: General;  Laterality: N/A;  . FLEXIBLE SIGMOIDOSCOPY N/A 10/21/2016   Procedure: FLEXIBLE SIGMOIDOSCOPY;  Surgeon: [name] Hippo, MD;  Location: [facility];  Service: Endoscopy;  Laterality: N/A;  . FLEXIBLE SIGMOIDOSCOPY N/A 10/25/2016   Procedure: FLEXIBLE SIGMOIDOSCOPY;  Surgeon: [name] Hippo, MD;  Location: [facility];  Service: Endoscopy;  Laterality: N/A;  . PEG PLACEMENT N/A 05/15/2019   Procedure: PERCUTANEOUS ENDOSCOPIC GASTROSTOMY (PEG) PLACEMENT;  Surgeon: [name] Roers, MD;  Location: [facility];  Service: General;  Laterality: N/A;  . unable     Social History   Socioeconomic History  . Marital status: Single    Spouse name: Not on file  . Number of children: Not on file  . Years of education: Not on file  . Highest education level: Not on file  Occupational History  . Not on file  Tobacco Use  . Smoking status: Never Smoker  . Smokeless tobacco: Never Used  Substance and Sexual Activity   . Alcohol use: No    Alcohol/week: 0.0 standard drinks  . Drug use: No  . Sexual activity: Not on file  Other Topics Concern  . Not on file  Social History Narrative  . Not on file   Social Determinants of Health   Financial Resource Strain:   . Difficulty of Paying Living Expenses: Not on file  Food Insecurity:   . Worried About Programme researcher, broadcasting/film/video in the Last Year: Not on file  . Ran Out of Food in the Last Year: Not on file  Transportation Needs:   . Lack of Transportation (Medical): Not on file  . Lack of Transportation (Non-Medical): Not on file  Physical Activity:   . Days of Exercise per Week: Not on file  . Minutes of Exercise per Session: Not on file  Stress:   . Feeling of Stress : Not on file  Social Connections:   . Frequency of Communication with Friends and Family: Not on file  . Frequency of Social Gatherings with Friends and Family: Not on file  . Attends Religious Services: Not on file  . Active Member of Clubs or Organizations: Not on file  . Attends Banker Meetings: Not on file  . Marital Status: Not on file   Outpatient Encounter Medications as of 07/09/2019  Medication Sig  . acetaminophen (TYLENOL) 325 MG tablet Take 650 mg by mouth every 6 (six) hours as needed for moderate pain.   Marland Kitchen antiseptic oral rinse (BIOTENE) LIQD Special Instructions: clean mouth with swab Every 6 Hours PRN  . antiseptic oral rinse (BIOTENE) LIQD Mucous membrane  Every 6 Hours - PRN  . apixaban (ELIQUIS) 5 MG TABS tablet Place 5 mg into feeding tube 2 (two) times daily. For dvt/pe/cva po  . atorvastatin (LIPITOR) 10 MG tablet Take 10 mg by mouth daily.  Marland Kitchen atropine 1 % ophthalmic solution Place 1 drop under the tongue every 6 (six) hours as needed.  . bisacodyl (DULCOLAX) 10 MG suppository Place 1 suppository (10 mg total) rectally at bedtime.  . budesonide-formoterol (SYMBICORT) 80-4.5 MCG/ACT inhaler Inhale 2 puffs into the lungs 2 (two) times daily.  .  carboxymethylcellulose (REFRESH PLUS) 0.5 % SOLN Place 1 drop into the right eye 3 (three) times daily.   Marland Kitchen erythromycin ophthalmic ointment Place into the left eye. appy 1 ribbon ophthalmic (eye) 3 times a day  . Insulin Glargine (BASAGLAR KWIKPEN) 100 UNIT/ML SOPN Inject 25 Units into the skin at bedtime.   . insulin lispro (HUMALOG KWIKPEN) 100 UNIT/ML KwikPen Inject 15 Units into the skin 3 (three) times daily. With tube feedings  . ipratropium-albuterol (DUONEB) 0.5-2.5 (3) MG/3ML SOLN Take 3 mLs by nebulization every 6 (six) hours as needed.  . labetalol (NORMODYNE) 200 MG tablet Take 200 mg by mouth daily. For HTN  . lisinopril (ZESTRIL) 2.5 MG tablet Take 2.5 mg by mouth daily.  Marland Kitchen loratadine (CLARITIN) 10 MG tablet Take 10 mg by mouth daily.  . methimazole (TAPAZOLE) 5 MG tablet Take 2.5 mg by mouth daily.  . NON FORMULARY Diet- Liquids: Nectar Thick Liquids  Diet:Dysphagia 1 Puree  . OXYGEN Inhale 2 L into the lungs. Every shift  . sennosides (SENOKOT) 8.8 MG/5ML syrup Place 5 mLs into feeding tube 2 (two) times daily as needed for mild constipation.  Marland Kitchen umeclidinium bromide (INCRUSE ELLIPTA) 62.5 MCG/INH AEPB Inhale 1 puff into the lungs daily.  . Water For Irrigation, Sterile (FREE WATER) SOLN Flush G-Tube with 30 ml water Bid   No facility-administered encounter medications on file as of 07/09/2019.   ALLERGIES: Allergies  Allergen Reactions  . Penicillins     Has patient had a PCN reaction causing immediate rash, facial/tongue/throat swelling, SOB or lightheadedness with hypotension: unknown Has patient had a PCN reaction causing severe rash involving mucus membranes or skin necrosis: unknown Has patient had a PCN reaction that required hospitalization: unknown Has patient had a PCN reaction occurring within the last 10 years: unknown If all of the above answers are "NO", then may proceed with Cephalosporin use. 5/3 Tolerated Cefepime x 1 dose in ED.     VACCINATION  STATUS: Immunization History  Administered Date(s) Administered  . Influenza-Unspecified 03/28/2014, 03/23/2016, 03/25/2017, 03/23/2018, 03/26/2019  . Moderna SARS-COVID-2 Vaccination 06/27/2019  . Pneumococcal Conjugate-13 04/21/2015  . Pneumococcal-Unspecified 11/01/2009, 03/30/2016  . Tdap 03/17/2017    HPI [name] [name] is 74 y.o. male who is being engaged in telehealth for follow-up of hyperthyroidism.  He is being assisted by his nurse in Nursing Home.   -During his last visit, he was reinitiated on methimazole 5 mg p.o. daily for relapse of hyperthyroidism.  His most recent thyroid function tests are consistent with treatment effect.  No new complaints today.  -  He has multiple medical problems including stroke with right-sided hemiplegia, nursing home  resident. - He also has dysphagia, was found to be losing weight, currently reversing. - He was sent for thyroid uptake and scan which showed nonfocal elevated uptake of 42%.   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   Review of Systems musculoskeletal: + Wheelchair-bound ,  stiff muscles on extremities.  Skin: no rashes Limited as above.  Objective:    There were no vitals taken for this visit.  Wt Readings from Last 3 Encounters:  07/02/19 220 lb 6.4 oz (100 kg)  06/27/19 220 lb 6.4 oz (100 kg)  06/26/19 220 lb 6.4 oz (100 kg)       Diabetic Labs (most recent): Lab Results  Component Value Date   HGBA1C 11.3 (H) 05/09/2019   HGBA1C 6.0 04/26/2009   HGBA1C  03/27/2009    6.0 (NOTE) The ADA recommends the following therapeutic goal for glycemic control related to Hgb A1c measurement: Goal of therapy: <6.5 Hgb A1c  Reference: American Diabetes Association: Clinical Practice Recommendations 2010, Diabetes Care, 2010, 33: (Suppl  1).     Lipid Panel ( most recent) Lipid Panel     Component Value Date/Time    CHOL 109 07/04/2019 0410   TRIG 57 07/04/2019 0410   HDL 30 (L) 07/04/2019 0410   CHOLHDL 3.6 07/04/2019 0410   VLDL 11 07/04/2019 0410   LDLCALC 68 07/04/2019 0410     Results for [name] [name], [name] [name] (MRN [id number]) as of 07/09/2019 10:25  Ref. Range 03/16/2019 09:35 07/05/2019 04:14  TSH Latest Ref Range: 0.350 - 4.500 uIU/mL 2.668 1.257  T4,Free(Direct) Latest Ref Range: 0.61 - 1.12 ng/dL 0.81 0.66     Assessment & Plan:   1.  Hyperthyroidism -He has responded to that reinitiation of methimazole 5 mg p.o. daily with normalization of his thyroid function tests.  I discussed and lowered his dose to 2.5 mg p.o. daily with plan to repeat thyroid function test in 4 months     -He is on labetalol 200 mg per daily related to his high blood pressure.  - I advised patient to maintain close follow up with [name], MD for  primary care needs.     - Time spent on this patient care encounter:  20 minutes of which 50% was spent in  counseling and the rest reviewing  his current and  previous labs / studies and medications  doses and developing a plan for long term care. Eulah Pont  participated in the discussions, expressed understanding, and voiced agreement with the above plans.  All questions were answered to his satisfaction. he is encouraged to contact clinic should he have any questions or concerns prior to his return visit.  Follow up plan: Return in about 4 months (around 11/06/2019) for Follow up with Pre-visit Labs.  Glade Lloyd, MD [facility] Phone: [phone]  Fax: [phone]   07/09/2019, 10:23 AM This note was partially dictated with voice recognition software. Similar sounding words can be transcribed inadequately or may not  be corrected upon review.

## 2019-07-10 DIAGNOSIS — I69351 Hemiplegia and hemiparesis following cerebral infarction affecting right dominant side: Secondary | ICD-10-CM | POA: Diagnosis not present

## 2019-07-10 DIAGNOSIS — Z515 Encounter for palliative care: Secondary | ICD-10-CM | POA: Diagnosis not present

## 2019-07-10 DIAGNOSIS — I69391 Dysphagia following cerebral infarction: Secondary | ICD-10-CM | POA: Diagnosis not present

## 2019-07-18 DIAGNOSIS — H04123 Dry eye syndrome of bilateral lacrimal glands: Secondary | ICD-10-CM | POA: Diagnosis not present

## 2019-07-25 DIAGNOSIS — Z23 Encounter for immunization: Secondary | ICD-10-CM | POA: Diagnosis not present

## 2019-07-27 ENCOUNTER — Encounter: Payer: Self-pay | Admitting: Adult Health

## 2019-07-27 ENCOUNTER — Non-Acute Institutional Stay (SKILLED_NURSING_FACILITY): Payer: Medicare Other | Admitting: Adult Health

## 2019-07-27 DIAGNOSIS — I679 Cerebrovascular disease, unspecified: Secondary | ICD-10-CM

## 2019-07-27 DIAGNOSIS — G8191 Hemiplegia, unspecified affecting right dominant side: Secondary | ICD-10-CM

## 2019-07-27 DIAGNOSIS — E785 Hyperlipidemia, unspecified: Secondary | ICD-10-CM

## 2019-07-27 DIAGNOSIS — I639 Cerebral infarction, unspecified: Secondary | ICD-10-CM | POA: Diagnosis not present

## 2019-07-27 DIAGNOSIS — E0590 Thyrotoxicosis, unspecified without thyrotoxic crisis or storm: Secondary | ICD-10-CM | POA: Diagnosis not present

## 2019-07-27 NOTE — Progress Notes (Signed)
Location:    [facility]     Nursing Home Room Number: 112W Place of Service:  SNF (31) Carleene Overlie NP   CODE STATUS: FULL CODE  Allergies  Allergen Reactions  . Penicillins     Has patient had a PCN reaction causing immediate rash, facial/tongue/throat swelling, SOB or lightheadedness with hypotension: unknown Has patient had a PCN reaction causing severe rash involving mucus membranes or skin necrosis: unknown Has patient had a PCN reaction that required hospitalization: unknown Has patient had a PCN reaction occurring within the last 10 years: unknown If all of the above answers are "NO", then may proceed with Cephalosporin use. 5/3 Tolerated Cefepime x 1 dose in ED.     Chief Complaint  Patient presents with  . Medical Management of Chronic Issues         Cerebrovascular accident (CVA) due to other mechanism/hemiplgia of right dominant side due to cerebrovascular disease:  Dyslipidemia: Hyperthyroidism:    HPI:  He is a 74 year old long term resident of this facility being seen for the management of his chronic illnesses: cva; hemiplegia; dyslipidemia hyperthyroidism. There are no reports of aspiration; no reports of uncontrolled pain; no reports of anxiety or agitation.   Past Medical History:  Diagnosis Date  . Dysphasia   . Fatty liver   . Hemiplegia (HCC)   . Hypertension   . Pneumonia   . Pulmonary embolism (HCC)   . 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(HCC)   . Stroke [facility])     Past Surgical History:  Procedure Laterality Date  . ESOPHAGOGASTRODUODENOSCOPY (EGD) WITH PROPOFOL N/A 05/15/2019   Procedure: ESOPHAGOGASTRODUODENOSCOPY (EGD) WITH PROPOFOL;  Surgeon: Lucretia Roers, MD;  Location: [facility];  Service: General;  Laterality: N/A;  . FLEXIBLE SIGMOIDOSCOPY N/A 10/21/2016   Procedure: FLEXIBLE SIGMOIDOSCOPY;  Surgeon: Malissa Hippo, MD;  [facility]: [facility];  Service: Endoscopy;  Laterality: N/A;  . FLEXIBLE SIGMOIDOSCOPY N/A 10/25/2016   Procedure:  FLEXIBLE SIGMOIDOSCOPY;  Surgeon: Malissa Hippo, MD;  [facility]: [facility];  Service: Endoscopy;  Laterality: N/A;  . PEG PLACEMENT N/A 05/15/2019   Procedure: PERCUTANEOUS ENDOSCOPIC GASTROSTOMY (PEG) PLACEMENT;  Surgeon: Lucretia Roers, MD;  Location: [facility];  Service: General;  Laterality: N/A;  . unable      Social History   Socioeconomic History  . Marital status: Single    Spouse name: Not on file  . Number of children: Not on file  . Years of education: Not on file  . Highest education level: Not on file  Occupational History  . Not on file  Tobacco Use  . Smoking status: Never Smoker  . Smokeless tobacco: Never Used  Substance and Sexual Activity  . Alcohol use: No    Alcohol/week: 0.0 standard drinks  . Drug use: No  . Sexual activity: Not on file  Other Topics Concern  . Not on file  Social History Narrative  . Not on file   Social Determinants of Health   Financial Resource Strain:   . Difficulty of Paying Living Expenses: Not on file  Food Insecurity:   . Worried About Programme researcher, broadcasting/film/video in the Last Year: Not on file  . Ran Out of Food in the Last Year: Not on file  Transportation Needs:   . Lack of Transportation (Medical): Not on file  . Lack of Transportation (Non-Medical): Not on file  Physical Activity:   . Days of Exercise per Week: Not on file  .  Minutes of Exercise per Session: Not on file  Stress:   . Feeling of Stress : Not on file  Social Connections:   . Frequency of Communication with Friends and Family: Not on file  . Frequency of Social Gatherings with Friends and Family: Not on file  . Attends Religious Services: Not on file  . Active Member of Clubs or Organizations: Not on file  . Attends Banker Meetings: Not on file  . Marital Status: Not on file  Intimate Partner Violence:   . Fear of Current or Ex-Partner: Not on file  . Emotionally Abused: Not on file  . Physically Abused: Not on file  . Sexually Abused:  Not on file   History reviewed. No pertinent family history.    VITAL SIGNS BP (!) 150/88   Pulse 84   Temp 98.2 F (36.8 C) (Oral)   Resp 20   Ht 5' 8.5" (1.74 m)   Wt 227 lb 6.4 oz (103.1 kg)   SpO2 93%   BMI 34.07 kg/m   Outpatient Encounter Medications as of 07/27/2019  Medication Sig  . acetaminophen (TYLENOL) 325 MG tablet Take 650 mg by mouth every 6 (six) hours as needed for moderate pain.   Marland Kitchen antiseptic oral rinse (BIOTENE) LIQD Special Instructions: clean mouth with swab Every 6 Hours PRN  . antiseptic oral rinse (BIOTENE) LIQD Mucous membrane  Every 6 Hours - PRN  . apixaban (ELIQUIS) 5 MG TABS tablet Place 5 mg into feeding tube 2 (two) times daily. For dvt/pe/cva po  . atorvastatin (LIPITOR) 10 MG tablet Take 10 mg by mouth daily.  Marland Kitchen atropine 1 % ophthalmic solution Place 1 drop under the tongue every 6 (six) hours as needed.  . bisacodyl (DULCOLAX) 10 MG suppository Place 1 suppository (10 mg total) rectally at bedtime.  . budesonide-formoterol (SYMBICORT) 80-4.5 MCG/ACT inhaler Inhale 2 puffs into the lungs 2 (two) times daily.  . carboxymethylcellulose (REFRESH PLUS) 0.5 % SOLN Place 1 drop into the right eye in the morning, at noon, in the evening, and at bedtime.   Marland Kitchen erythromycin ophthalmic ointment Place 1 application into the left eye 4 (four) times daily. Also apply to right eye - dx corneal scar - continue until next ophthalmology visit At Bedtime  . Insulin Glargine (BASAGLAR KWIKPEN) 100 UNIT/ML SOPN Inject 25 Units into the skin at bedtime.   . insulin lispro (HUMALOG KWIKPEN) 100 UNIT/ML KwikPen Inject 10 Units into the skin 3 (three) times daily. 10 units if above 200; subcutaneous  Special Instructions: with meals if cbg above 200  . ipratropium-albuterol (DUONEB) 0.5-2.5 (3) MG/3ML SOLN Take 3 mLs by nebulization every 6 (six) hours as needed.  . labetalol (NORMODYNE) 200 MG tablet Take 200 mg by mouth daily. For HTN  . lisinopril (ZESTRIL) 2.5 MG  tablet Take 2.5 mg by mouth daily. hold for systolic < 110  . loratadine (CLARITIN) 10 MG tablet Take 10 mg by mouth daily.  . methimazole (TAPAZOLE) 5 MG tablet Take 2.5 mg by mouth daily.  . NON FORMULARY Diet- Liquids: Nectar Thick Liquids  Diet:Dysphagia 1 Puree  . OXYGEN Inhale 2 L into the lungs continuous. Every shift to maintain satuation >90%.  Marland Kitchen sennosides (SENOKOT) 8.8 MG/5ML syrup Place 5 mLs into feeding tube 2 (two) times daily as needed for mild constipation.  Marland Kitchen umeclidinium bromide (INCRUSE ELLIPTA) 62.5 MCG/INH AEPB Inhale 1 puff into the lungs daily.  . Water For Irrigation, Sterile (FREE WATER) SOLN Flush G-Tube with  30 ml water Bid   No facility-administered encounter medications on file as of 07/27/2019.     SIGNIFICANT DIAGNOSTIC EXAMS  PREVIOUS;   01-16-19: chest x-ray: Infiltrate versus atelectasis at LEFT base   05-05-19: chest x-ray: 1. Normal chest x-ray.  2. No tuberculosis is noted   05-08-19: KUB: Findings suggest constipation, fecal impaction or decreased colonic motility. Correlate with risk factors for constipation or fecal stasis, based on frequency or absence of bowel movements. Overall sensitivity is decreased, due to attenuation from patient body habitus.  Soft tissue attenuation results in significantly diminished diagnostic sensitivity and utility.  05-09-19: chest x-ray: Mild bibasilar subsegmental atelectasis or infiltrates are noted   05-09-19: ct of abdomen and pelvis:  1. Moderately large amount of stool in the distal sigmoid colon and rectum. Moderate gaseous distension of the more proximal sigmoid colon without evidence of volvulus or bowel obstruction. 2. Bibasilar lung consolidation concerning for pneumonia. 3. Aortic Atherosclerosis   07-12-18: chest x-ray: Cardiomegaly with low lung volumes and bibasilar collapse/consolidation, left greater than right.  05-16-19: chest x-ray: ETT in good position. Worsened bilateral airspace  disease small pleural effusions.  05-19-19: chest x-ray: Partial but incomplete clearing of opacity from the basis. Patchy airspace opacity remains in the lower lung regions with consolidation in a portion of the left base. There are small pleural effusions bilaterally. Upper lung regions appear clear. Heart upper normal in size. No adenopathy demonstrable by radiography.  NO NEW EXAMS     LABS REVIEWED: PREVIOUS   07-28-18: tsh 0.299; free T3: 2.8; free T4: 0.92 08-17-18: INR 2.5  09-14-18: INR 2.7 10-05-18: INR 3.3 10-12-18: INR 1.9  10-19-18: INR 2.9 10-26-18: wbc 8.8; hgb 15.7; hct 49.2; mcv 95.7; plt 240; glucose 74; bun 25; creat 1.05; k+ 3.9; na++ 141; ca 8.9 INR 2.1  11-27-18: INR 2.7 tsh 0.051 free T3: 3.2; free T4: 1.16 12-11-18: INR 3.3 12-13-18: INR 2.7 12-20-18: INR 2.4 tsh 0.444 free T3: 2.4 free T4: 0.90 01-02-19: INR 2.9  01-09-19: INR 3.5 01-16-19: INR 4.4 01-18-19: INR 3.3  01-23-19: INR 1.4  02-05-19: wbc 9.9; hgb 15.3; hct 482; mcv 96.4; plt 236; glucose 99; bun 46; creat 1.54; k+ 4.0; na++ 139; ca 8.7; liver normal albumin 3.2; chol 106; ldl 62; trig 54; hdl 33; PSA 10.58 (scheduled for 03-31-19)  03-16-19: tsh 2.668 free T4: 0.81 05-01-19: PSA 11.82 05-05-19: wbc 12.3; hgb 16.7; hct 53.4; mcv 98.2 plt 246; blood culture: no growth 05-08-19: PSA 16.39 05-09-19: wbc 13.3; hgb 16.9; hct 56.5; mcv 102.7 plt 261; glucose 689; bun 82; creat 2.78; k+ 5.3; na++ 151; ca 8.9; urine culture: enterococcus faecalis:  Hgb a1c 11.3 05-11-19: glucose 98; bun 36; creat 1.61; k+ 3.8; na++ 158; ca 7.8; phos 2.0 albumin 2.6 05-15-19: wbc 8.2; hgb 12.9; hct 42.2; mcv 100.2 plt 165; glucose 178; bun 15; creat 1.37; k+ 3.5; na++ 143; ca 7.5  05-18-19: wbc 7.8; hgb 13.3; hct 43.2; mcv 99.1 plt 227; glucose 246; bun 28; creat 1.21; k+ 3.2; na++ 139 ca 7.7 05-20-19: wbc 9.5; hgb 13.3; hct 44.0; mcv 98.9 plt 264; glucose 190; bun 25; creat 1.10; k+ 3.7; na++ 138; ca 8.3 ;liver normal albumin 2.4   TODAY   07-04-19 glucose 99; bun 31; creat 1.19 ;k+ 3.8; na++ 143; ca 8.8 chol 109; ldl 68 trig 57; hdl 30; urine micro-albumin 33.8 07-05-19: tsh 1.257 free T4: 0.66    Review of Systems  Constitutional: Negative for malaise/fatigue.  Respiratory: Negative for cough  and shortness of breath.   Cardiovascular: Negative for chest pain, palpitations and leg swelling.  Gastrointestinal: Negative for abdominal pain, constipation and heartburn.  Musculoskeletal: Negative for back pain, joint pain and myalgias.  Skin: Negative.   Neurological: Negative for dizziness.  Psychiatric/Behavioral: The patient is not nervous/anxious.     Physical Exam Constitutional:      General: He is not in acute distress.    Appearance: He is well-developed. He is obese. He is not diaphoretic.  Neck:     Thyroid: No thyromegaly.  Cardiovascular:     Rate and Rhythm: Normal rate and regular rhythm.     Heart sounds: Normal heart sounds.  Pulmonary:     Effort: Pulmonary effort is normal. No respiratory distress.     Breath sounds: Normal breath sounds.  Abdominal:     General: Bowel sounds are normal. There is no distension.     Palpations: Abdomen is soft.     Tenderness: There is no abdominal tenderness.     Comments: Peg tube present   Musculoskeletal:     Right lower leg: No edema.     Left lower leg: No edema.     Comments:  Right hemiplegia Is able to move left extremities     Lymphadenopathy:     Cervical: No cervical adenopathy.  Skin:    General: Skin is warm and dry.  Neurological:     Mental Status: He is alert. Mental status is at baseline.  Psychiatric:        Mood and Affect: Mood normal.       ASSESSMENT/ PLAN:  TODAY:  1. Cerebrovascular accident (CVA) due to other mechanism/hemiplgia of right dominant side due to cerebrovascular disease: is stable will continue eliquis 5 mg twice daily   2. Dyslipidemia: is stable LDL 68 will continue lipitor 10 mg daily   3. Hyperthyroidism: is  stable tsh 1.257; free T4: 0.66 will continue tapazole 2.5 mg daily    PREVIOUS   4. 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 Deep vein thrombosis (DVT) of left extremity unspecified vein/other chronic pulmonary embolism without acute cor pulmonale: is stable is on long term eliquis 5 mg twice daily   6. Type 2 diabetes mellitus new onset with long term current use of insulin: hgb a1c 11.3 is stable will continue basaglar 25 units nightly and humalog 15 units with meals is on ace statin and eliquis   7. Simple bronchitis is stable is 02 dependent: will continue symbicort 80/4.5 mcg 2 puffs twice daily claritin 10 mg daily incruse elipta 62.5 mcg 1 puff daily   8. Dysphagia due to old cerebrovascular accident on honey thick liquids/increased oral secretions: is without change; does chronically aspirate. Is on honey thick liquids per his choice. Has atropine drops as needed for oral secretions.   9. Hypertension associated with type 2 diabetes mellitus: is stable b/p 150/88 will continue labetalol 200 mg daily  lisinopril 2.5 mg     MD is aware of resident's narcotic use and is in agreement with current plan of care. We will attempt to wean resident as appropriate.  Synthia Innocent NP [facility]  Contact [phone] Monday through Friday 8am- 5pm  After hours call [phone]

## 2019-08-07 DIAGNOSIS — Z515 Encounter for palliative care: Secondary | ICD-10-CM | POA: Diagnosis not present

## 2019-08-07 DIAGNOSIS — I639 Cerebral infarction, unspecified: Secondary | ICD-10-CM | POA: Diagnosis not present

## 2019-08-07 DIAGNOSIS — G8191 Hemiplegia, unspecified affecting right dominant side: Secondary | ICD-10-CM | POA: Diagnosis not present

## 2019-08-07 DIAGNOSIS — J449 Chronic obstructive pulmonary disease, unspecified: Secondary | ICD-10-CM | POA: Diagnosis not present

## 2019-08-29 ENCOUNTER — Non-Acute Institutional Stay (SKILLED_NURSING_FACILITY): Payer: Medicare Other | Admitting: Internal Medicine

## 2019-08-29 ENCOUNTER — Encounter: Payer: Self-pay | Admitting: Internal Medicine

## 2019-08-29 DIAGNOSIS — E0590 Thyrotoxicosis, unspecified without thyrotoxic crisis or storm: Secondary | ICD-10-CM | POA: Diagnosis not present

## 2019-08-29 DIAGNOSIS — E1159 Type 2 diabetes mellitus with other circulatory complications: Secondary | ICD-10-CM | POA: Diagnosis not present

## 2019-08-29 DIAGNOSIS — I152 Hypertension secondary to endocrine disorders: Secondary | ICD-10-CM

## 2019-08-29 DIAGNOSIS — E785 Hyperlipidemia, unspecified: Secondary | ICD-10-CM

## 2019-08-29 DIAGNOSIS — I10 Essential (primary) hypertension: Secondary | ICD-10-CM | POA: Diagnosis not present

## 2019-08-29 NOTE — Progress Notes (Signed)
Location:  [facility] Room Number: 112-W Place of Service:  SNF (31)  Margit Hanks, MD  Patient Care Team: Margit Hanks, MD as PCP - General (Internal Medicine) Center, [facility]) Chilton Si, Chong Sicilian, NP as Nurse Practitioner (Geriatric Medicine)  Extended Emergency Contact Information Primary Emergency Contact: [name] Address: [address]          [place], [place] [address] Darden Amber of [place] Home Phone: [phone] Mobile Phone: [phone] Relation: Brother    Allergies: Penicillin g and Penicillins  Chief Complaint  Patient presents with  . Medical Management of Chronic Issues    Routine [facility] visit    HPI: Patient is a 74 y.o. male who is being seen for routine issues of hypertension, hyperthyroidism and hyperlipidemia.  Past Medical History:  Diagnosis Date  . Dysphasia   . Fatty liver   . Hemiplegia (HCC)   . Hypertension   . Pneumonia   . Pulmonary embolism (HCC)   . 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(HCC)   . Stroke [facility])     Past Surgical History:  Procedure Laterality Date  . ESOPHAGOGASTRODUODENOSCOPY (EGD) WITH PROPOFOL N/A 05/15/2019   Procedure: ESOPHAGOGASTRODUODENOSCOPY (EGD) WITH PROPOFOL;  Surgeon: Lucretia Roers, MD;  Location: [facility] ORS;  Service: General;  Laterality: N/A;  . FLEXIBLE SIGMOIDOSCOPY N/A 10/21/2016   Procedure: FLEXIBLE SIGMOIDOSCOPY;  Surgeon: Malissa Hippo, MD;  Location: [facility] ENDO SUITE;  Service: Endoscopy;  Laterality: N/A;  . FLEXIBLE SIGMOIDOSCOPY N/A 10/25/2016   Procedure: FLEXIBLE SIGMOIDOSCOPY;  Surgeon: Malissa Hippo, MD;  Location: [facility] ENDO SUITE;  Service: Endoscopy;  Laterality: N/A;  . PEG PLACEMENT N/A 05/15/2019   Procedure: PERCUTANEOUS ENDOSCOPIC GASTROSTOMY (PEG) PLACEMENT;  Surgeon: Lucretia Roers, MD;  Location: [facility] ORS;  Service: General;  Laterality: N/A;  . unable      Allergies as of 08/29/2019      Reactions   Penicillin G     Penicillins    Has patient had a PCN reaction causing immediate rash, facial/tongue/throat swelling, SOB or lightheadedness with hypotension: unknown Has patient had a PCN reaction causing severe rash involving mucus membranes or skin necrosis: unknown Has patient had a PCN reaction that required hospitalization: unknown Has patient had a PCN reaction occurring within the last 10 years: unknown If all of the above answers are "NO", then may proceed with Cephalosporin use. 5/3 Tolerated Cefepime x 1 dose in ED.      Medication List    Notice   This visit is during an admission. Changes to the med list made in this visit will be reflected in the After Visit Summary of the admission.     No orders of the defined types were placed in this encounter.   Immunization History  Administered Date(s) Administered  . Influenza-Unspecified 03/28/2014, 03/23/2016, 03/25/2017, 03/23/2018, 03/26/2019  . Moderna SARS-COVID-2 Vaccination 06/27/2019  . Pneumococcal Conjugate-13 04/21/2015  . Pneumococcal-Unspecified 11/01/2009, 03/30/2016  . Tdap 03/17/2017    Social History   Tobacco Use  . Smoking status: Never Smoker  . Smokeless tobacco: Never Used  Substance Use Topics  . Alcohol use: No    Alcohol/week: 0.0 standard drinks    Review of Systems  GENERAL:  no fevers, fatigue, appetite changes SKIN: No itching, rash HEENT: No complaint RESPIRATORY: No cough, wheezing, SOB CARDIAC: No chest pain, palpitations, lower extremity edema  GI: No abdominal pain, No N/V/D or constipation, No heartburn or reflux  GU: No dysuria, frequency  or urgency, or incontinence  MUSCULOSKELETAL: No unrelieved bone/joint pain NEUROLOGIC: No headache, dizziness  PSYCHIATRIC: No overt anxiety or sadness  Vitals:   08/29/19 1519  BP: 128/74  Pulse: 81  Resp: 18  Temp: 98.6 F (37 C)  SpO2: 97%   Body mass index is 34.01 kg/m. Physical Exam  GENERAL APPEARANCE: Alert, conversant, No acute  distress  SKIN: No diaphoresis rash HEENT: Unremarkable RESPIRATORY: Breathing is even, unlabored. Lung sounds are clear   CARDIOVASCULAR: Heart RRR no murmurs, rubs or gallops. No peripheral edema  GASTROINTESTINAL: Abdomen is soft, non-tender, not distended w/ normal bowel sounds.  PEG table GENITOURINARY: Bladder non tender, not distended  MUSCULOSKELETAL: No abnormal joints or musculature NEUROLOGIC: Cranial nerves 2-12 grossly intact; right hemiplegia PSYCHIATRIC: Mood and affect appropriate to situation, no behavioral issues  Patient Active Problem List   Diagnosis Date Noted  . Acute bacterial conjunctivitis of right eye 07/03/2019  . Hypertension associated with type 2 diabetes mellitus ([place]) 06/26/2019  . GERD (gastroesophageal reflux disease) 06/06/2019  . Palliative care by specialist   . Dyspnea and respiratory abnormalities   . Increased oropharyngeal secretions   . Endotracheally intubated--- on ventilator 05/15/2019  . PEG (percutaneous endoscopic gastrostomy) status -Placed 05/15/2019 05/15/2019  . Enterococcus faecalis UTI infection 05/11/2019  . Goals of care, counseling/discussion   . Palliative care encounter   . Hypernatremia 05/09/2019  . Sepsis secondary to pneumonia 05/09/2019  . 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 AKI (acute kidney injury) ([place]) 05/09/2019  . Elevated PSA, between 10 and less than 20 ng/ml 02/08/2019  . 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 Dysphagia due to old cerebrovascular accident-on honey thickened liquids 08/25/2018  . Dyslipidemia 08/25/2018  . Hyperthyroidism 04/05/2017  . 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([place]) 10/25/2016  . COPD (chronic obstructive pulmonary disease) ([place]) 10/19/2016  . 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 Long term current use of anticoagulant therapy-on Eliquis due to history of DVT/PE and strokes 10/21/2012  . DVT (deep venous thrombosis) ([place]) 09/26/2012  . Chronic pulmonary embolism ([place]) 09/26/2012  . Constipation 09/26/2012    CMP     Component Value Date/Time   NA 143 07/04/2019 0410   NA 142 09/12/2015 0000   K 3.8 07/04/2019 0410   CL 103 07/04/2019 0410   CO2 30 07/04/2019 0410   GLUCOSE 99 07/04/2019 0410   BUN 31 (H) 07/04/2019 0410   BUN 18 09/12/2015 0000   CREATININE 1.19 07/04/2019 0410   CALCIUM 8.8 (L) 07/04/2019 0410   PROT 7.5 05/20/2019 0432   ALBUMIN 2.4 (L) 05/20/2019 0432   AST 51 (H) 05/20/2019 0432   ALT 43 05/20/2019 0432   ALKPHOS 51 05/20/2019 0432   BILITOT 0.2 (L) 05/20/2019 0432   GFRNONAA >60 07/04/2019 0410   GFRAA >60 07/04/2019 0410   Recent Labs    05/16/19 1707 05/16/19 1707 05/17/19 0451 05/17/19 1707 05/18/19 0606 05/18/19 1324 05/20/19 0432 05/21/19 0433 05/28/19 0612 07/04/19 0410  NA  --   --   --   --    < > 139   < > 135 134* 143  K  --   --   --   --    < > 4.1   < > 3.8 4.8 3.8  CL  --   --   --   --    < >  100   < > 95* 93* 103  CO2  --   --   --   --    < > 30   < > 31 30 30   GLUCOSE  --   --   --   --    < > 267*   < > 199* 240* 99  BUN  --   --   --   --    < > 30*   < > 29* 20 31*  CREATININE  --   --   --   --    < > 1.29*   < > 1.10 0.96 1.19  CALCIUM  --   --   --   --    < > 7.9*   < > 8.2* 9.5 8.8*  MG 2.0  --  2.0 1.9  --   --   --   --   --   --   PHOS 1.8*   < > 2.0* 2.2*  --  2.2*  --   --   --   --    < > = values in this interval not displayed.   Recent Labs    02/05/19 0620 02/05/19 0620 05/09/19 1310 05/09/19 1826 05/14/19 0428 05/18/19 1324 05/20/19 0432  AST 17  --  60*  --   --   --  51*  ALT 17  --  69*  --   --   --  43  ALKPHOS 50  --  72  --   --   --  51  BILITOT 0.4  --  0.7  --   --   --  0.2*  PROT 7.2  --  8.7*  --   --   --  7.5    ALBUMIN 3.2*   < > 3.4*   < > 2.2* 2.3* 2.4*   < > = values in this interval not displayed.   Recent Labs    05/09/19 1200 05/09/19 1200 05/09/19 1310 05/10/19 0451 05/15/19 0549 05/15/19 0549 05/16/19 0423 05/18/19 0606 05/20/19 0432  WBC 13.3*   < > 12.1*   < > 8.2   < > 8.0 7.8 9.5  NEUTROABS 10.4*  --  9.4*  --  5.7  --   --   --   --   HGB 16.9   < > 17.3*   < > 12.9*   < > 12.5* 13.3 13.5  HCT 56.5*   < > 58.4*   < > 42.2   < > 41.0 43.2 44.0  MCV 102.7*   < > 103.0*   < > 100.2*   < > 99.5 99.1 98.9  PLT 261   < > 259   < > 165   < > 177 227 264   < > = values in this interval not displayed.   Recent Labs    02/05/19 0620 05/16/19 0423 05/17/19 0451 05/18/19 0606 07/04/19 0410  CHOL 106  --   --   --  109  LDLCALC 62  --   --   --  68  TRIG 54   < > 125 164* 57   < > = values in this interval not displayed.   Lab Results  Component Value Date   MICROALBUR 33.8 (H) 07/04/2019   Lab Results  Component Value Date   TSH 1.257 07/05/2019   Lab Results  Component Value Date   HGBA1C 11.3 (H) 05/09/2019  Lab Results  Component Value Date   CHOL 109 07/04/2019   HDL 30 (L) 07/04/2019   LDLCALC 68 07/04/2019   TRIG 57 07/04/2019   CHOLHDL 3.6 07/04/2019    Significant Diagnostic Results in last 30 days:  No results found.  Assessment and Plan  Hypertension associated with type 2 diabetes mellitus (HCC) Controlled; continue labetalol 200 mg daily and lisinopril 2.5 mg daily  Hyperthyroidism TSH is 2.26; continue methimazole 2.5 mg daily  Dyslipidemia LDL 68; continue Lipitor 10 mg daily     Margit Hanks, MD

## 2019-09-02 ENCOUNTER — Encounter: Payer: Self-pay | Admitting: Internal Medicine

## 2019-09-02 NOTE — Assessment & Plan Note (Signed)
TSH is 2.26; continue methimazole 2.5 mg daily

## 2019-09-02 NOTE — Assessment & Plan Note (Signed)
LDL 68; continue Lipitor 10 mg daily

## 2019-09-02 NOTE — Assessment & Plan Note (Signed)
Controlled; continue labetalol 200 mg daily and lisinopril 2.5 mg daily

## 2019-09-04 DIAGNOSIS — I69391 Dysphagia following cerebral infarction: Secondary | ICD-10-CM | POA: Diagnosis not present

## 2019-09-04 DIAGNOSIS — Z515 Encounter for palliative care: Secondary | ICD-10-CM | POA: Diagnosis not present

## 2019-09-14 ENCOUNTER — Non-Acute Institutional Stay (SKILLED_NURSING_FACILITY): Payer: Medicare Other | Admitting: Adult Health

## 2019-09-14 ENCOUNTER — Encounter: Payer: Self-pay | Admitting: Adult Health

## 2019-09-14 DIAGNOSIS — IMO0002 Reserved for concepts with insufficient information to code with codable children: Secondary | ICD-10-CM

## 2019-09-14 DIAGNOSIS — I2782 Chronic pulmonary embolism: Secondary | ICD-10-CM

## 2019-09-14 DIAGNOSIS — Z0000 Encounter for general adult medical examination without abnormal findings: Secondary | ICD-10-CM | POA: Diagnosis not present

## 2019-09-14 DIAGNOSIS — Z7901 Long term (current) use of anticoagulants: Secondary | ICD-10-CM

## 2019-09-14 DIAGNOSIS — I69391 Dysphagia following cerebral infarction: Secondary | ICD-10-CM

## 2019-09-14 DIAGNOSIS — E1149 Type 2 diabetes mellitus with other diabetic neurological complication: Secondary | ICD-10-CM

## 2019-09-14 NOTE — Patient Instructions (Signed)
  [name] , Thank you for taking time to come for your Medicare Wellness Visit. I appreciate your ongoing commitment to your health goals. Please review the following plan we discussed and let me know if I can assist you in the future.   These are the goals we discussed: Goals    . Follow up with Primary Care Provider       This is a list of the screening recommended for you and due dates:  Health Maintenance  Topic Date Due  . Colon Cancer Screening  10/30/2025*  . Hemoglobin A1C  11/06/2019  . Complete foot exam   04/12/2020  . Eye exam for diabetics  04/29/2020  . Urine Protein Check  07/03/2020  . Tetanus Vaccine  03/18/2027  . Flu Shot  Completed  .  Hepatitis C: One time screening is recommended by Center for Disease Control  (CDC) for  adults born from [birth year] through 1965.   Completed  . Pneumonia vaccines  Completed  *Topic was postponed. The date shown is not the original due date.

## 2019-09-14 NOTE — Progress Notes (Signed)
Subjective:   [name] [name] is a 74 y.o. male who presents for Medicare Annual/Subsequent preventive examination. Long term resident of [facility]   Review of Systems:  Review of Systems  Constitutional: Negative for malaise/fatigue.  Respiratory: Negative for cough and shortness of breath.   Cardiovascular: Negative for chest pain, palpitations and leg swelling.  Gastrointestinal: Negative for abdominal pain, constipation and heartburn.  Musculoskeletal: Negative for back pain, joint pain and myalgias.  Skin: Negative.   Neurological: Negative for dizziness.  Psychiatric/Behavioral: The patient is not nervous/anxious.     Cardiac Risk Factors include: advanced age (>16men, >82 women);obesity (BMI >30kg/m2);sedentary lifestyle;diabetes mellitus;hypertension     Objective:    Vitals: BP (!) 148/84   Pulse 78   Temp 97.8 F (36.6 C)   Resp 20   Ht 5' 8.5" (1.74 m)   Wt 227 lb (103 kg)   SpO2 96%   BMI 34.01 kg/m   Body mass index is 34.01 kg/m.  Advanced Directives 07/27/2019 07/02/2019 06/27/2019 06/26/2019 06/13/2019 06/11/2019 06/08/2019  Does Patient Have a Medical Advance Directive? Yes Yes Yes Yes Yes Yes Yes  Type of Advance Directive (No Data) (No Data) (No Data) (No Data) (No Data) (No Data) (No Data)  Does patient want to make changes to medical advance directive? No - Patient declined No - Patient declined No - Patient declined No - Patient declined No - Patient declined No - Patient declined No - Patient declined  Copy of [place] in [place]  Would patient like information on creating a medical advance directive? - - - - - - -    Tobacco Social History   Tobacco Use  Smoking Status Never Smoker  Smokeless Tobacco Never Used     Counseling given: Not Answered   Clinical Intake:  Pre-visit preparation completed: Yes  Pain : No/denies pain Pain Score: 0-No pain     BMI - recorded: 34.01 Nutritional Status: BMI > 30   Obese Diabetes: Yes  How often do you need to have someone help you when you read instructions, pamphlets, or other written materials from your doctor or pharmacy?: 5 - Always  Interpreter Needed?: No     Past Medical History:  Diagnosis Date  . Dysphasia   . Fatty liver   . Hemiplegia ([place])   . Hypertension   . Pneumonia   . Pulmonary embolism ([place])   . 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([place])   . Stroke [facility])    Past Surgical History:  Procedure Laterality Date  . ESOPHAGOGASTRODUODENOSCOPY (EGD) WITH PROPOFOL N/A 05/15/2019   Procedure: ESOPHAGOGASTRODUODENOSCOPY (EGD) WITH PROPOFOL;  Surgeon: Virl Cagey, MD;  Location: [facility] OR[facility];  Service: General;  Laterality: N/A;  . FLEXIBLE SIGMOIDOSCOPY N/A 10/21/2016   Procedure: FLEXIBLE SIGMOIDOSCOPY;  Surgeon: Rogene Houston, MD;  Location: [facility] END[facility];  Service: Endoscopy;  Laterality: N/A;  . FLEXIBLE SIGMOIDOSCOPY N/A 10/25/2016   Procedure: FLEXIBLE SIGMOIDOSCOPY;  Surgeon: Rogene Houston, MD;  Location: [facility];  Service: Endoscopy;  Laterality: N/A;  . PEG PLACEMENT N/A 05/15/2019   Procedure: PERCUTANEOUS ENDOSCOPIC GASTROSTOMY (PEG) PLACEMENT;  Surgeon: Virl Cagey, MD;  Location: [facility] ORS;  Service: General;  Laterality: N/A;  . unable     No family history on file. Social History   Socioeconomic History  . Marital status: Single    Spouse name: Not on file  . Number of children: Not on file  . Years of education:  Not on file  . Highest education level: Not on file  Occupational History  . Not on file  Tobacco Use  . Smoking status: Never Smoker  . Smokeless tobacco: Never Used  Substance and Sexual Activity  . Alcohol use: No    Alcohol/week: 0.0 standard drinks  . Drug use: No  . Sexual activity: Not on file  Other Topics Concern  . Not on file  Social History Narrative  . Not on file   Social Determinants of Health   Financial Resource Strain:   . Difficulty of Paying Living Expenses:   Food  Insecurity:   . Worried About Programme researcher, broadcasting/film/video in the Last Year:   . Barista in the Last Year:   Transportation Needs:   . Freight forwarder (Medical):   Marland Kitchen Lack of Transportation (Non-Medical):   Physical Activity:   . Days of Exercise per Week:   . Minutes of Exercise per Session:   Stress:   . Feeling of Stress :   Social Connections:   . Frequency of Communication with Friends and Family:   . Frequency of Social Gatherings with Friends and Family:   . Attends Religious Services:   . Active Member of Clubs or Organizations:   . Attends Banker Meetings:   Marland Kitchen Marital Status:     Outpatient Encounter Medications as of 09/14/2019  Medication Sig  . acetaminophen (TYLENOL) 325 MG tablet Take 650 mg by mouth every 6 (six) hours as needed for moderate pain.   Marland Kitchen apixaban (ELIQUIS) 5 MG TABS tablet Place 5 mg into feeding tube 2 (two) times daily. For dvt/pe/cva po  . atorvastatin (LIPITOR) 10 MG tablet Take 10 mg by mouth daily.  Marland Kitchen atropine 1 % ophthalmic solution Place 1 drop under the tongue every 6 (six) hours as needed.  . bisacodyl (DULCOLAX) 10 MG suppository Place 1 suppository (10 mg total) rectally at bedtime.  . budesonide-formoterol (SYMBICORT) 80-4.5 MCG/ACT inhaler Inhale 2 puffs into the lungs 2 (two) times daily.  . carboxymethylcellulose (REFRESH PLUS) 0.5 % SOLN Place 1 drop into the right eye in the morning, at noon, in the evening, and at bedtime.   Marland Kitchen erythromycin ophthalmic ointment Place 1 application into the left eye 4 (four) times daily. Also apply to right eye - dx corneal scar - continue until next ophthalmology visit At Bedtime  . insulin aspart (NOVOLOG FLEXPEN) 100 UNIT/ML FlexPen Inject 10 Units into the skin 3 (three) times daily with meals. 10 units if above 200; subcutaneous  Special Instructions: with meals if cbg above 200  . Insulin Glargine (BASAGLAR KWIKPEN) 100 UNIT/ML SOPN Inject 25 Units into the skin at bedtime.   Marland Kitchen  ipratropium-albuterol (DUONEB) 0.5-2.5 (3) MG/3ML SOLN Take 3 mLs by nebulization every 6 (six) hours as needed.  . labetalol (NORMODYNE) 200 MG tablet Take 200 mg by mouth daily. For HTN  . lisinopril (ZESTRIL) 2.5 MG tablet Take 2.5 mg by mouth daily. hold for systolic < 110  . loratadine (CLARITIN) 10 MG tablet Take 10 mg by mouth daily.  . methimazole (TAPAZOLE) 5 MG tablet Take 2.5 mg by mouth daily.  . NON FORMULARY Diet- Liquids: Nectar Thick Liquids  Diet:Dysphagia 1 Puree  . OXYGEN Inhale 2 L into the lungs continuous. Every shift to maintain satuation >90%.  Marland Kitchen sennosides (SENOKOT) 8.8 MG/5ML syrup Place 5 mLs into feeding tube 2 (two) times daily as needed for mild constipation.  Marland Kitchen umeclidinium bromide (INCRUSE  ELLIPTA) 62.5 MCG/INH AEPB Inhale 1 puff into the lungs daily.  . Water For Irrigation, Sterile (FREE WATER) SOLN Flush G-Tube with 30 ml water Bid  . [DISCONTINUED] insulin lispro (HUMALOG KWIKPEN) 100 UNIT/ML KwikPen Inject 10 Units into the skin 3 (three) times daily. 10 units if above 200; subcutaneous  Special Instructions: with meals if cbg above 200   No facility-administered encounter medications on file as of 09/14/2019.    Activities of Daily Living In your present state of health, do you have any difficulty performing the following activities: 09/14/2019 05/10/2019  Hearing? N N  Vision? N Y  Difficulty concentrating or making decisions? Malvin Johns  Walking or climbing stairs? Y Y  Dressing or bathing? Y Y  Doing errands, shopping? Malvin Johns  Preparing Food and eating ? Y -  Using the Toilet? Y -  In the past six months, have you accidently leaked urine? Y -  Do you have problems with loss of bowel control? Y -  Managing your Medications? Y -  Managing your Finances? Y -  Housekeeping or managing your Housekeeping? Y -  Some recent data might be hidden    Patient Care Team: Margit Hanks, MD as PCP - General (Internal Medicine) Center, [facility]) Chilton Si Chong Sicilian, NP as Nurse Practitioner (Geriatric Medicine)   Assessment:   This is a routine wellness examination for [place].  Exercise Activities and Dietary recommendations Current Exercise Habits: The patient does not participate in regular exercise at present  Goals    . Follow up with Primary Care Provider       Fall Risk Fall Risk  09/14/2019 02/05/2019 03/27/2018 04/05/2017 03/17/2017  Falls in the past year? 0 0 No No No  Risk for fall due to : - Impaired balance/gait;Impaired mobility - Impaired balance/gait;Impaired mobility;Medication side effect;Impaired vision;History of fall(s) -   Is the patient's home free of loose throw rugs in walkways, pet beds, electrical cords, etc?   yes      Grab bars in the bathroom? yes      Handrails on the stairs?  n/a       Adequate lighting?   yes  Timed Get Up and Go Performed: unable to participate   Depression Screen PHQ 2/9 Scores 09/14/2019 02/05/2019 03/27/2018 04/05/2017  PHQ - 2 Score 0 0 0 0    Cognitive Function     6CIT Screen 03/27/2018 03/17/2017  What Year? 0 points 4 points  What month? 0 points 0 points  What time? 0 points 0 points  Count back from 20 0 points 0 points  Months in reverse 4 points 4 points  Repeat phrase 6 points 6 points  Total Score 10 14    Immunization History  Administered Date(s) Administered  . Influenza-Unspecified 03/28/2014, 03/23/2016, 03/25/2017, 03/23/2018, 03/26/2019  . Moderna SARS-COVID-2 Vaccination 06/27/2019  . Pneumococcal Conjugate-13 04/21/2015  . Pneumococcal-Unspecified 11/01/2009, 03/30/2016  . Tdap 03/17/2017    Qualifies for Shingles Vaccine? n/a  Screening Tests Health Maintenance  Topic Date Due  . COLONOSCOPY  10/30/2025 (Originally 11/08/1995)  . HEMOGLOBIN A1C  11/06/2019  . FOOT EXAM  04/12/2020  . OPHTHALMOLOGY EXAM  04/29/2020  . URINE MICROALBUMIN  07/03/2020  . TETANUS/TDAP  03/18/2027  . INFLUENZA VACCINE  Completed  .  Hepatitis C Screening  Completed  . PNA vac Low Risk Adult  Completed   Cancer Screenings: Lung: Low Dose CT Chest recommended if Age 67-80 years, 30 pack-year currently smoking  OR have quit w/in 15years. Patient does not  qualify. Colorectal: n/a  Additional Screenings: n/a Hepatitis C Screening:      Plan:     I have personally reviewed and noted the following in the patient's chart:   . Medical and social history . Use of alcohol, tobacco or illicit drugs  . Current medications and supplements . Functional ability and status . Nutritional status . Physical activity . Advanced directives . List of other physicians . Hospitalizations, surgeries, and ER visits in previous 12 months . Vitals . Screenings to include cognitive, depression, and falls . Referrals and appointments  In addition, I have reviewed and discussed with patient certain preventive protocols, quality metrics, and best practice recommendations. A written personalized care plan for preventive services as well as general preventive health recommendations were provided to patient.     Sharee Holster, NP  09/14/2019

## 2019-09-25 DIAGNOSIS — Z1159 Encounter for screening for other viral diseases: Secondary | ICD-10-CM | POA: Diagnosis not present

## 2019-09-25 DIAGNOSIS — Z431 Encounter for attention to gastrostomy: Secondary | ICD-10-CM | POA: Diagnosis not present

## 2019-09-26 ENCOUNTER — Telehealth: Payer: Self-pay

## 2019-09-26 NOTE — Telephone Encounter (Signed)
Error

## 2019-09-26 NOTE — Telephone Encounter (Signed)
Mavis from the [facility] is requesting Dr.Jenkins to remove PEG tube from patient.  We let her know Dr.Jenkins is on vacation this week, however Harvin Hazel will ;et him know on Monday.  Patient is on [place] [facility]-Bed 112 window. They do have order for removal of PEG tube.

## 2019-10-01 ENCOUNTER — Other Ambulatory Visit (INDEPENDENT_AMBULATORY_CARE_PROVIDER_SITE_OTHER): Payer: Medicare Other | Admitting: General Surgery

## 2019-10-01 DIAGNOSIS — Z431 Encounter for attention to gastrostomy: Secondary | ICD-10-CM | POA: Diagnosis not present

## 2019-10-01 NOTE — Progress Notes (Signed)
Patient seen at [facility].  PEG removed with traction at bedside without difficulty.  Dressing applied.

## 2019-10-02 ENCOUNTER — Encounter: Payer: Self-pay | Admitting: Adult Health

## 2019-10-02 ENCOUNTER — Non-Acute Institutional Stay (SKILLED_NURSING_FACILITY): Payer: Medicare Other | Admitting: Adult Health

## 2019-10-02 DIAGNOSIS — Z794 Long term (current) use of insulin: Secondary | ICD-10-CM

## 2019-10-02 DIAGNOSIS — IMO0002 Reserved for concepts with insufficient information to code with codable children: Secondary | ICD-10-CM

## 2019-10-02 DIAGNOSIS — E1149 Type 2 diabetes mellitus with other diabetic neurological complication: Secondary | ICD-10-CM

## 2019-10-02 DIAGNOSIS — I2782 Chronic pulmonary embolism: Secondary | ICD-10-CM | POA: Diagnosis not present

## 2019-10-02 DIAGNOSIS — E1165 Type 2 diabetes mellitus with hyperglycemia: Secondary | ICD-10-CM

## 2019-10-02 DIAGNOSIS — I82502 Chronic embolism and thrombosis of unspecified deep veins of left lower extremity: Secondary | ICD-10-CM

## 2019-10-02 DIAGNOSIS — K562 Volvulus: Secondary | ICD-10-CM

## 2019-10-02 NOTE — Progress Notes (Signed)
Location:    [facility] Room Number: 112/W Place of Service:  SNF (31)   CODE STATUS: DNR  Allergies  Allergen Reactions  . Penicillin G   . Penicillins     Has patient had a PCN reaction causing immediate rash, facial/tongue/throat swelling, SOB or lightheadedness with hypotension: unknown Has patient had a PCN reaction causing severe rash involving mucus membranes or skin necrosis: unknown Has patient had a PCN reaction that required hospitalization: unknown Has patient had a PCN reaction occurring within the last 10 years: unknown If all of the above answers are "NO", then may proceed with Cephalosporin use. 5/3 Tolerated Cefepime x 1 dose in ED.     Chief Complaint  Patient presents with  . Medical Management of Chronic Issues         Sigmoid volvulus:    Deep vein thrombosis (DVT) of left extremity unspecified vein/other chronic pulmonary embolism without acute cor pulmonale:   Type 2 diabetes mellitus new onset with long term current use of insulin:     HPI:  He is a 74 year old long term resident of this facility being seen for the management of his chronic illnesses: dvt; pe; diabetes. There are no reports of uncontrolled pain; no changes in appetite; no signs of aspiration. Staff reports that he is unable to use his inhalers will stop his encruse and symbicort as he is unable to take these medications will changes duoneb to routine   Past Medical History:  Diagnosis Date  . Dysphasia   . Fatty liver   . Hemiplegia (HCC)   . Hypertension   . Pneumonia   . Pulmonary embolism (HCC)   . 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(HCC)   . Stroke [facility])     Past Surgical History:  Procedure Laterality Date  . ESOPHAGOGASTRODUODENOSCOPY (EGD) WITH PROPOFOL N/A 05/15/2019   Procedure: ESOPHAGOGASTRODUODENOSCOPY (EGD) WITH PROPOFOL;  Surgeon: Lucretia Roers, MD;  Location: [facility] ORS;  Service: General;  Laterality: N/A;  . FLEXIBLE SIGMOIDOSCOPY N/A 10/21/2016   Procedure:  FLEXIBLE SIGMOIDOSCOPY;  Surgeon: Malissa Hippo, MD;  Location: [facility] ENDO SUITE;  Service: Endoscopy;  Laterality: N/A;  . FLEXIBLE SIGMOIDOSCOPY N/A 10/25/2016   Procedure: FLEXIBLE SIGMOIDOSCOPY;  Surgeon: Malissa Hippo, MD;  Location: [facility] END[facility] SUITE;  Service: Endoscopy;  Laterality: N/A;  . PEG PLACEMENT N/A 05/15/2019   Procedure: PERCUTANEOUS ENDOSCOPIC GASTROSTOMY (PEG) PLACEMENT;  Surgeon: Lucretia Roers, MD;  Location: [facility] ORS;  Service: General;  Laterality: N/A;  . unable      Social History   Socioeconomic History  . Marital status: Single    Spouse name: Not on file  . Number of children: Not on file  . Years of education: Not on file  . Highest education level: Not on file  Occupational History  . Not on file  Tobacco Use  . Smoking status: Never Smoker  . Smokeless tobacco: Never Used  Substance and Sexual Activity  . Alcohol use: No    Alcohol/week: 0.0 standard drinks  . Drug use: No  . Sexual activity: Not on file  Other Topics Concern  . Not on file  Social History Narrative  . Not on file   Social Determinants of Health   Financial Resource Strain:   . Difficulty of Paying Living Expenses:   Food Insecurity:   . Worried About Programme researcher, broadcasting/film/video in the Last Year:   . Barista in the Last Year:   Transportation Needs:   .  Lack of Transportation (Medical):   Marland Kitchen Lack of Transportation (Non-Medical):   Physical Activity:   . Days of Exercise per Week:   . Minutes of Exercise per Session:   Stress:   . Feeling of Stress :   Social Connections:   . Frequency of Communication with Friends and Family:   . Frequency of Social Gatherings with Friends and Family:   . Attends Religious Services:   . Active Member of Clubs or Organizations:   . Attends Banker Meetings:   Marland Kitchen Marital Status:   Intimate Partner Violence:   . Fear of Current or Ex-Partner:   . Emotionally Abused:   Marland Kitchen Physically Abused:   . Sexually Abused:    No  family history on file.    VITAL SIGNS BP (!) 145/89   Pulse 76   Temp 98 F (36.7 C) (Oral)   Resp 20   Ht 5' 8.5" (1.74 m)   Wt 236 lb 3.2 oz (107.1 kg)   SpO2 96%   BMI 35.39 kg/m   Outpatient Encounter Medications as of 10/02/2019  Medication Sig  . acetaminophen (TYLENOL) 325 MG tablet Take 650 mg by mouth every 6 (six) hours as needed for moderate pain.   Marland Kitchen apixaban (ELIQUIS) 5 MG TABS tablet Place 5 mg into feeding tube 2 (two) times daily. For dvt/pe/cva po  . atorvastatin (LIPITOR) 10 MG tablet Take 10 mg by mouth daily.  Marland Kitchen atropine 1 % ophthalmic solution Place 1 drop under the tongue every 6 (six) hours as needed.  . bisacodyl (DULCOLAX) 10 MG suppository Place 1 suppository (10 mg total) rectally at bedtime.  . carboxymethylcellulose (REFRESH PLUS) 0.5 % SOLN Place 1 drop into the right eye in the morning, at noon, in the evening, and at bedtime.   Marland Kitchen erythromycin ophthalmic ointment Place 1 application into the left eye 4 (four) times daily. Also apply to right eye - dx corneal scar - continue until next ophthalmology visit At Bedtime  . insulin aspart (NOVOLOG FLEXPEN) 100 UNIT/ML FlexPen Inject 10 Units into the skin 3 (three) times daily with meals. 10 units if above 200; subcutaneous  Special Instructions: with meals if cbg above 200  . Insulin Glargine (BASAGLAR KWIKPEN) 100 UNIT/ML SOPN Inject 25 Units into the skin at bedtime.   Marland Kitchen ipratropium-albuterol (DUONEB) 0.5-2.5 (3) MG/3ML SOLN Take 3 mLs by nebulization every 6 (six) hours as needed.  . labetalol (NORMODYNE) 200 MG tablet Take 200 mg by mouth daily. For HTN  . lisinopril (ZESTRIL) 2.5 MG tablet Take 2.5 mg by mouth daily. hold for systolic < 110  . loratadine (CLARITIN) 10 MG tablet Take 10 mg by mouth daily.  . methimazole (TAPAZOLE) 5 MG tablet Take 2.5 mg by mouth daily.  . NON FORMULARY Diet- Liquids: Nectar Thick Liquids  Diet:Dysphagia 1 Puree  . OXYGEN Inhale 2 L into the lungs continuous. Every  shift to maintain satuation >90%.  Marland Kitchen sennosides (SENOKOT) 8.8 MG/5ML syrup Place 5 mLs into feeding tube 2 (two) times daily as needed for mild constipation.  . [DISCONTINUED] budesonide-formoterol (SYMBICORT) 80-4.5 MCG/ACT inhaler Inhale 2 puffs into the lungs 2 (two) times daily.  . [DISCONTINUED] umeclidinium bromide (INCRUSE ELLIPTA) 62.5 MCG/INH AEPB Inhale 1 puff into the lungs daily.  . [DISCONTINUED] Water For Irrigation, Sterile (FREE WATER) SOLN Flush G-Tube with 30 ml water Bid   No facility-administered encounter medications on file as of 10/02/2019.     SIGNIFICANT DIAGNOSTIC EXAMS   PREVIOUS;  01-16-19: chest x-ray: Infiltrate versus atelectasis at LEFT base   05-05-19: chest x-ray: 1. Normal chest x-ray.  2. No tuberculosis is noted   05-08-19: KUB: Findings suggest constipation, fecal impaction or decreased colonic motility. Correlate with risk factors for constipation or fecal stasis, based on frequency or absence of bowel movements. Overall sensitivity is decreased, due to attenuation from patient body habitus.  Soft tissue attenuation results in significantly diminished diagnostic sensitivity and utility.  05-09-19: chest x-ray: Mild bibasilar subsegmental atelectasis or infiltrates are noted   05-09-19: ct of abdomen and pelvis:  1. Moderately large amount of stool in the distal sigmoid colon and rectum. Moderate gaseous distension of the more proximal sigmoid colon without evidence of volvulus or bowel obstruction. 2. Bibasilar lung consolidation concerning for pneumonia. 3. Aortic Atherosclerosis   07-12-18: chest x-ray: Cardiomegaly with low lung volumes and bibasilar collapse/consolidation, left greater than right.  05-16-19: chest x-ray: ETT in good position. Worsened bilateral airspace disease small pleural effusions.  05-19-19: chest x-ray: Partial but incomplete clearing of opacity from the basis. Patchy airspace opacity remains in the lower lung  regions with consolidation in a portion of the left base. There are small pleural effusions bilaterally. Upper lung regions appear clear. Heart upper normal in size. No adenopathy demonstrable by radiography.  NO NEW EXAMS     LABS REVIEWED: PREVIOUS   10-05-18: INR 3.3 10-12-18: INR 1.9  10-19-18: INR 2.9 10-26-18: wbc 8.8; hgb 15.7; hct 49.2; mcv 95.7; plt 240; glucose 74; bun 25; creat 1.05; k+ 3.9; na++ 141; ca 8.9 INR 2.1  11-27-18: INR 2.7 tsh 0.051 free T3: 3.2; free T4: 1.16 12-11-18: INR 3.3 12-13-18: INR 2.7 12-20-18: INR 2.4 tsh 0.444 free T3: 2.4 free T4: 0.90 01-02-19: INR 2.9  01-09-19: INR 3.5 01-16-19: INR 4.4 01-18-19: INR 3.3  01-23-19: INR 1.4  02-05-19: wbc 9.9; hgb 15.3; hct 482; mcv 96.4; plt 236; glucose 99; bun 46; creat 1.54; k+ 4.0; na++ 139; ca 8.7; liver normal albumin 3.2; chol 106; ldl 62; trig 54; hdl 33; PSA 10.58 (scheduled for 03-31-19)  03-16-19: tsh 2.668 free T4: 0.81 05-01-19: PSA 11.82 05-05-19: wbc 12.3; hgb 16.7; hct 53.4; mcv 98.2 plt 246; blood culture: no growth 05-08-19: PSA 16.39 05-09-19: wbc 13.3; hgb 16.9; hct 56.5; mcv 102.7 plt 261; glucose 689; bun 82; creat 2.78; k+ 5.3; na++ 151; ca 8.9; urine culture: enterococcus faecalis:  Hgb a1c 11.3 05-11-19: glucose 98; bun 36; creat 1.61; k+ 3.8; na++ 158; ca 7.8; phos 2.0 albumin 2.6 05-15-19: wbc 8.2; hgb 12.9; hct 42.2; mcv 100.2 plt 165; glucose 178; bun 15; creat 1.37; k+ 3.5; na++ 143; ca 7.5  05-18-19: wbc 7.8; hgb 13.3; hct 43.2; mcv 99.1 plt 227; glucose 246; bun 28; creat 1.21; k+ 3.2; na++ 139 ca 7.7 05-20-19: wbc 9.5; hgb 13.3; hct 44.0; mcv 98.9 plt 264; glucose 190; bun 25; creat 1.10; k+ 3.7; na++ 138; ca 8.3 ;liver normal albumin 2.4  07-04-19 glucose 99; bun 31; creat 1.19 ;k+ 3.8; na++ 143; ca 8.8 chol 109; ldl 68 trig 57; hdl 30; urine micro-albumin 33.8 07-05-19: tsh 1.257 free T4: 0.66   NO NEW LABS.    Review of Systems  Constitutional: Negative for malaise/fatigue.  Respiratory:  Negative for cough and shortness of breath.   Cardiovascular: Negative for chest pain, palpitations and leg swelling.  Gastrointestinal: Negative for abdominal pain, constipation and heartburn.  Musculoskeletal: Negative for back pain, joint pain and myalgias.  Skin: Negative.   Neurological:  Negative for dizziness.  Psychiatric/Behavioral: The patient is not nervous/anxious.     Physical Exam Constitutional:      General: He is not in acute distress.    Appearance: He is well-developed. He is obese. He is not diaphoretic.  Neck:     Thyroid: No thyromegaly.  Cardiovascular:     Rate and Rhythm: Normal rate and regular rhythm.     Pulses: Normal pulses.     Heart sounds: Normal heart sounds.  Pulmonary:     Effort: Pulmonary effort is normal. No respiratory distress.     Breath sounds: Wheezing and rhonchi present.  Abdominal:     General: Bowel sounds are normal. There is no distension.     Palpations: Abdomen is soft.     Tenderness: There is no abdominal tenderness.     Comments: Peg tube site without signs of infection   Musculoskeletal:     Cervical back: Neck supple.     Right lower leg: No edema.     Left lower leg: No edema.     Comments: Right hemiplegia   Lymphadenopathy:     Cervical: No cervical adenopathy.  Skin:    General: Skin is warm and dry.  Neurological:     Mental Status: He is alert. Mental status is at baseline.  Psychiatric:        Mood and Affect: Mood normal.      ASSESSMENT/ PLAN:  TODAY:  1. Sigmoid volvulus: is stable wll continue senna twice daily as needed  2. Deep vein thrombosis (DVT) of left extremity unspecified vein/other chronic pulmonary embolism without acute cor pulmonale: is stable is on long term eliquis 5 mg twice daily   3. Type 2 diabetes mellitus new onset with long term current use of insulin: hgb a1c 11.3 will continue basaglar 25 units nightly and humalog 15 units with meals.   Is on ace statin and eliquis     PREVIOUS   4. 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 Dysphagia due to old cerebrovascular accident on honey thick liquids/increased oral secretions: is without change; does chronically aspirate. Is on honey thick liquids per his choice. Has atropine drops as needed for oral secretions.   6. Hypertension associated with type 2 diabetes mellitus: is stable b/p 145/89 will continue labetalol 200 mg daily  lisinopril 2.5 mg   7. 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 Dyslipidemia: is stable LDL 68 will continue lipitor 10 mg daily   9. Hyperthyroidism: is stable tsh 1.257; free T4: 0.66 will continue tapazole 2.5 mg daily   Will check hgb a1c   MD is aware of resident's narcotic use and is in agreement with current plan of care. We will attempt to wean resident as appropriate.  Synthia Innocent NP [facility]  Contact [phone] Monday through Friday 8am- 5pm  After hours call [phone]

## 2019-10-11 ENCOUNTER — Encounter (HOSPITAL_COMMUNITY)
Admission: RE | Admit: 2019-10-11 | Discharge: 2019-10-11 | Disposition: A | Discharge status: Home / Self Care | Payer: Medicare Other | Source: Skilled Nursing Facility | Attending: Adult Health | Admitting: Adult Health

## 2019-10-11 DIAGNOSIS — Z431 Encounter for attention to gastrostomy: Secondary | ICD-10-CM | POA: Diagnosis not present

## 2019-10-11 DIAGNOSIS — E1165 Type 2 diabetes mellitus with hyperglycemia: Secondary | ICD-10-CM | POA: Diagnosis not present

## 2019-10-11 DIAGNOSIS — E870 Hyperosmolality and hypernatremia: Secondary | ICD-10-CM | POA: Insufficient documentation

## 2019-10-11 DIAGNOSIS — A419 Sepsis, unspecified organism: Secondary | ICD-10-CM | POA: Diagnosis not present

## 2019-10-11 DIAGNOSIS — H1031 Unspecified acute conjunctivitis, right eye: Secondary | ICD-10-CM | POA: Insufficient documentation

## 2019-10-11 LAB — HEMOGLOBIN A1C
Hgb A1c MFr Bld: 7.2 % — ABNORMAL HIGH (ref 4.8–5.6)
Mean Plasma Glucose: 159.94 mg/dL

## 2019-10-17 DIAGNOSIS — Z431 Encounter for attention to gastrostomy: Secondary | ICD-10-CM | POA: Diagnosis not present

## 2019-10-17 DIAGNOSIS — Z1159 Encounter for screening for other viral diseases: Secondary | ICD-10-CM | POA: Diagnosis not present

## 2019-11-02 ENCOUNTER — Encounter: Payer: Self-pay | Admitting: Adult Health

## 2019-11-02 ENCOUNTER — Non-Acute Institutional Stay (SKILLED_NURSING_FACILITY): Payer: Medicare Other | Admitting: Adult Health

## 2019-11-02 DIAGNOSIS — I10 Essential (primary) hypertension: Secondary | ICD-10-CM | POA: Diagnosis not present

## 2019-11-02 DIAGNOSIS — I69391 Dysphagia following cerebral infarction: Secondary | ICD-10-CM

## 2019-11-02 DIAGNOSIS — E1159 Type 2 diabetes mellitus with other circulatory complications: Secondary | ICD-10-CM | POA: Diagnosis not present

## 2019-11-02 DIAGNOSIS — J410 Simple chronic bronchitis: Secondary | ICD-10-CM

## 2019-11-02 NOTE — Progress Notes (Signed)
Location:    [facility] Room Number: 112/W Place of Service:  SNF (31)   CODE STATUS: DNR  Allergies  Allergen Reactions  . Penicillin G   . Penicillins     Has patient had a PCN reaction causing immediate rash, facial/tongue/throat swelling, SOB or lightheadedness with hypotension: unknown Has patient had a PCN reaction causing severe rash involving mucus membranes or skin necrosis: unknown Has patient had a PCN reaction that required hospitalization: unknown Has patient had a PCN reaction occurring within the last 10 years: unknown If all of the above answers are "NO", then may proceed with Cephalosporin use. 5/3 Tolerated Cefepime x 1 dose in ED.     Chief Complaint  Patient presents with  . Medical Management of Chronic Issues         Simple bronchitis    Dysphagia due to old cerebrovascular accident   Hypertension associated with type 2 diabetes mellitus    HPI:  He is a 74 year old man being seen for the management of his chronic illnesses: bronchitis; dysphagia; hypertension. There are no reports of known aspiration; no uncontrolled pain; no anxiety or agitation.   Past Medical History:  Diagnosis Date  . Dysphasia   . Fatty liver   . Hemiplegia (HCC)   . Hypertension   . Pneumonia   . Pulmonary embolism (HCC)   . 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(HCC)   . Stroke [facility])     Past Surgical History:  Procedure Laterality Date  . ESOPHAGOGASTRODUODENOSCOPY (EGD) WITH PROPOFOL N/A 05/15/2019   Procedure: ESOPHAGOGASTRODUODENOSCOPY (EGD) WITH PROPOFOL;  Surgeon: Lucretia Roers, MD;  Location: [facility] ORS;  Service: General;  Laterality: N/A;  . FLEXIBLE SIGMOIDOSCOPY N/A 10/21/2016   Procedure: FLEXIBLE SIGMOIDOSCOPY;  Surgeon: Malissa Hippo, MD;  Location: [facility] ENDO SUITE;  Service: Endoscopy;  Laterality: N/A;  . FLEXIBLE SIGMOIDOSCOPY N/A 10/25/2016   Procedure: FLEXIBLE SIGMOIDOSCOPY;  Surgeon: Malissa Hippo, MD;  Location: [facility] ENDO SUITE;  Service:  Endoscopy;  Laterality: N/A;  . PEG PLACEMENT N/A 05/15/2019   Procedure: PERCUTANEOUS ENDOSCOPIC GASTROSTOMY (PEG) PLACEMENT;  Surgeon: Lucretia Roers, MD;  Location: [facility] ORS;  Service: General;  Laterality: N/A;  . unable      Social History   Socioeconomic History  . Marital status: Single    Spouse name: Not on file  . Number of children: Not on file  . Years of education: Not on file  . Highest education level: Not on file  Occupational History  . Not on file  Tobacco Use  . Smoking status: Never Smoker  . Smokeless tobacco: Never Used  Substance and Sexual Activity  . Alcohol use: No    Alcohol/week: 0.0 standard drinks  . Drug use: No  . Sexual activity: Not on file  Other Topics Concern  . Not on file  Social History Narrative  . Not on file   Social Determinants of Health   Financial Resource Strain:   . Difficulty of Paying Living Expenses:   Food Insecurity:   . Worried About Programme researcher, broadcasting/film/video in the Last Year:   . Barista in the Last Year:   Transportation Needs:   . Freight forwarder (Medical):   Marland Kitchen Lack of Transportation (Non-Medical):   Physical Activity:   . Days of Exercise per Week:   . Minutes of Exercise per Session:   Stress:   . Feeling of Stress :   Social Connections:   . Frequency  of Communication with Friends and Family:   . Frequency of Social Gatherings with Friends and Family:   . Attends Religious Services:   . Active Member of Clubs or Organizations:   . Attends Archivist Meetings:   Marland Kitchen Marital Status:   Intimate Partner Violence:   . Fear of Current or Ex-Partner:   . Emotionally Abused:   Marland Kitchen Physically Abused:   . Sexually Abused:    No family history on file.    VITAL SIGNS BP 133/86   Pulse 81   Temp (!) 97.4 F (36.3 C) (Oral)   Resp 20   Ht 5' 8.5" (1.74 m)   Wt 214 lb (97.1 kg)   SpO2 97%   BMI 32.07 kg/m   Outpatient Encounter Medications as of 11/02/2019  Medication Sig  .  acetaminophen (TYLENOL) 325 MG tablet Take 650 mg by mouth every 6 (six) hours as needed for moderate pain.   Marland Kitchen apixaban (ELIQUIS) 5 MG TABS tablet Place 5 mg into feeding tube 2 (two) times daily. For dvt/pe/cva po  . atorvastatin (LIPITOR) 10 MG tablet Take 10 mg by mouth daily.  Marland Kitchen atropine 1 % ophthalmic solution Place 1 drop under the tongue every 6 (six) hours as needed.  . bisacodyl (DULCOLAX) 10 MG suppository Place 1 suppository (10 mg total) rectally at bedtime.  . carboxymethylcellulose (REFRESH PLUS) 0.5 % SOLN Place 1 drop into the right eye in the morning, at noon, in the evening, and at bedtime.   Marland Kitchen erythromycin ophthalmic ointment Place 1 application into the left eye 4 (four) times daily. Also apply to right eye - dx corneal scar - continue until next ophthalmology visit At Bedtime  . insulin aspart (NOVOLOG FLEXPEN) 100 UNIT/ML FlexPen Inject 10 Units into the skin 3 (three) times daily with meals. 10 units if above 200; subcutaneous  Special Instructions: with meals if cbg above 200  . Insulin Glargine (BASAGLAR KWIKPEN) 100 UNIT/ML SOPN Inject 25 Units into the skin at bedtime.   Marland Kitchen ipratropium-albuterol (DUONEB) 0.5-2.5 (3) MG/3ML SOLN Take 3 mLs by nebulization every 6 (six) hours as needed.  . labetalol (NORMODYNE) 200 MG tablet Take 200 mg by mouth daily. For HTN  . lisinopril (ZESTRIL) 2.5 MG tablet Take 2.5 mg by mouth daily. hold for systolic < 254  . loratadine (CLARITIN) 10 MG tablet Take 10 mg by mouth daily.  . methimazole (TAPAZOLE) 5 MG tablet Take 2.5 mg by mouth daily.  . NON FORMULARY Diet- Liquids: Nectar Thick Liquids  Diet:Dysphagia 1 Puree  . OXYGEN Inhale 2 L into the lungs continuous. Every shift to maintain satuation >90%.  Marland Kitchen sennosides (SENOKOT) 8.8 MG/5ML syrup Place 5 mLs into feeding tube 2 (two) times daily as needed for mild constipation.   No facility-administered encounter medications on file as of 11/02/2019.     SIGNIFICANT DIAGNOSTIC  EXAMS  PREVIOUS;   01-16-19: chest x-ray: Infiltrate versus atelectasis at LEFT base   05-05-19: chest x-ray: 1. Normal chest x-ray.  2. No tuberculosis is noted   05-08-19: KUB: Findings suggest constipation, fecal impaction or decreased colonic motility. Correlate with risk factors for constipation or fecal stasis, based on frequency or absence of bowel movements. Overall sensitivity is decreased, due to attenuation from patient body habitus.  Soft tissue attenuation results in significantly diminished diagnostic sensitivity and utility.  05-09-19: chest x-ray: Mild bibasilar subsegmental atelectasis or infiltrates are noted   05-09-19: ct of abdomen and pelvis:  1. Moderately large amount of  stool in the distal sigmoid colon and rectum. Moderate gaseous distension of the more proximal sigmoid colon without evidence of volvulus or bowel obstruction. 2. Bibasilar lung consolidation concerning for pneumonia. 3. Aortic Atherosclerosis   07-12-18: chest x-ray: Cardiomegaly with low lung volumes and bibasilar collapse/consolidation, left greater than right.  05-16-19: chest x-ray: ETT in good position. Worsened bilateral airspace disease small pleural effusions.  05-19-19: chest x-ray: Partial but incomplete clearing of opacity from the basis. Patchy airspace opacity remains in the lower lung regions with consolidation in a portion of the left base. There are small pleural effusions bilaterally. Upper lung regions appear clear. Heart upper normal in size. No adenopathy demonstrable by radiography.  NO NEW EXAMS     LABS REVIEWED: PREVIOUS   10-26-18: wbc 8.8; hgb 15.7; hct 49.2; mcv 95.7; plt 240; glucose 74; bun 25; creat 1.05; k+ 3.9; na++ 141; ca 8.9 INR 2.1  11-27-18: INR 2.7 tsh 0.051 free T3: 3.2; free T4: 1.16 12-11-18: INR 3.3 12-13-18: INR 2.7 12-20-18: INR 2.4 tsh 0.444 free T3: 2.4 free T4: 0.90 01-02-19: INR 2.9  01-09-19: INR 3.5 01-16-19: INR 4.4 01-18-19: INR 3.3  01-23-19:  INR 1.4  02-05-19: wbc 9.9; hgb 15.3; hct 482; mcv 96.4; plt 236; glucose 99; bun 46; creat 1.54; k+ 4.0; na++ 139; ca 8.7; liver normal albumin 3.2; chol 106; ldl 62; trig 54; hdl 33; PSA 10.58 (scheduled for 03-31-19)  03-16-19: tsh 2.668 free T4: 0.81 05-01-19: PSA 11.82 05-05-19: wbc 12.3; hgb 16.7; hct 53.4; mcv 98.2 plt 246; blood culture: no growth 05-08-19: PSA 16.39 05-09-19: wbc 13.3; hgb 16.9; hct 56.5; mcv 102.7 plt 261; glucose 689; bun 82; creat 2.78; k+ 5.3; na++ 151; ca 8.9; urine culture: enterococcus faecalis:  Hgb a1c 11.3 05-11-19: glucose 98; bun 36; creat 1.61; k+ 3.8; na++ 158; ca 7.8; phos 2.0 albumin 2.6 05-15-19: wbc 8.2; hgb 12.9; hct 42.2; mcv 100.2 plt 165; glucose 178; bun 15; creat 1.37; k+ 3.5; na++ 143; ca 7.5  05-18-19: wbc 7.8; hgb 13.3; hct 43.2; mcv 99.1 plt 227; glucose 246; bun 28; creat 1.21; k+ 3.2; na++ 139 ca 7.7 05-20-19: wbc 9.5; hgb 13.3; hct 44.0; mcv 98.9 plt 264; glucose 190; bun 25; creat 1.10; k+ 3.7; na++ 138; ca 8.3 ;liver normal albumin 2.4  07-04-19 glucose 99; bun 31; creat 1.19 ;k+ 3.8; na++ 143; ca 8.8 chol 109; ldl 68 trig 57; hdl 30; urine micro-albumin 33.8 07-05-19: tsh 1.257 free T4: 0.66   TODAY  10-11-19: hgb a1c 7.2     Review of Systems  Constitutional: Negative for malaise/fatigue.  Respiratory: Negative for cough and shortness of breath.   Cardiovascular: Negative for chest pain, palpitations and leg swelling.  Gastrointestinal: Negative for abdominal pain, constipation and heartburn.  Musculoskeletal: Negative for back pain, joint pain and myalgias.  Skin: Negative.   Neurological: Negative for dizziness.  Psychiatric/Behavioral: The patient is not nervous/anxious.       Physical Exam Constitutional:      General: He is not in acute distress.    Appearance: He is well-developed. He is obese. He is not diaphoretic.  Neck:     Thyroid: No thyromegaly.  Cardiovascular:     Rate and Rhythm: Normal rate and regular  rhythm.     Pulses: Normal pulses.     Heart sounds: Normal heart sounds.  Pulmonary:     Effort: Pulmonary effort is normal. No respiratory distress.     Breath sounds: Rhonchi present.  Abdominal:  General: Bowel sounds are normal. There is no distension.     Palpations: Abdomen is soft.     Tenderness: There is no abdominal tenderness.  Musculoskeletal:     Cervical back: Neck supple.     Right lower leg: No edema.     Comments: Right hemiplegia   Lymphadenopathy:     Cervical: No cervical adenopathy.  Skin:    General: Skin is warm and dry.  Neurological:     Mental Status: He is alert. Mental status is at baseline.  Psychiatric:        Mood and Affect: Mood normal.      ASSESSMENT/ PLAN:  TODAY:  1. 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 Dysphagia due to old cerebrovascular accident on honey thick liquids: stable chronically aspirates; honey thick liquids no signs of aspiration present.   3. Hypertension associated with type 2 diabetes mellitus is stable b/p 133/86 will continue labetalol 200 mg daily lisinopril 2.5 mg daily    PREVIOUS   4. Cerebrovascular accident (CVA) due to other mechanism/hemiplgia of right dominant side due to cerebrovascular disease: is stable will continue eliquis 5 mg twice daily   5. Dyslipidemia: is stable LDL 68 will continue lipitor 10 mg daily   6. Hyperthyroidism: is stable tsh 1.257; free T4: 0.66 will continue tapazole 2.5 mg daily   7. Sigmoid volvulus: is stable wll continue senna twice daily as needed  8. Deep vein thrombosis (DVT) of left extremity unspecified vein/other chronic pulmonary embolism without acute cor pulmonale: is stable is on long term eliquis 5 mg twice daily   9. Type 2 diabetes mellitus new onset with long term current use of insulin: hgb a1c 7.2 will continue basaglar 25 units nightly  and humalog 15 units with meals.   Is on ace statin and eliquis           MD is aware of resident's narcotic use and is in agreement with current plan of care. We will attempt to wean resident as appropriate.  Synthia Innocent NP [facility]  Contact [phone] Monday through Friday 8am- 5pm  After hours call [phone]

## 2019-11-05 ENCOUNTER — Encounter (HOSPITAL_COMMUNITY)
Admission: RE | Admit: 2019-11-05 | Discharge: 2019-11-05 | Disposition: A | Discharge status: Home / Self Care | Payer: Medicare Other | Source: Skilled Nursing Facility | Attending: Adult Health | Admitting: Adult Health

## 2019-11-05 DIAGNOSIS — Z431 Encounter for attention to gastrostomy: Secondary | ICD-10-CM | POA: Diagnosis not present

## 2019-11-05 DIAGNOSIS — A419 Sepsis, unspecified organism: Secondary | ICD-10-CM | POA: Diagnosis not present

## 2019-11-05 DIAGNOSIS — H1031 Unspecified acute conjunctivitis, right eye: Secondary | ICD-10-CM | POA: Diagnosis not present

## 2019-11-05 DIAGNOSIS — E1165 Type 2 diabetes mellitus with hyperglycemia: Secondary | ICD-10-CM | POA: Insufficient documentation

## 2019-11-05 DIAGNOSIS — E058 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: Secondary | ICD-10-CM | POA: Diagnosis not present

## 2019-11-05 DIAGNOSIS — E870 Hyperosmolality and hypernatremia: Secondary | ICD-10-CM | POA: Diagnosis not present

## 2019-11-05 LAB — TSH: TSH: 2.34 u[IU]/mL (ref 0.350–4.500)

## 2019-11-08 ENCOUNTER — Ambulatory Visit: Payer: Medicare Other | Admitting: "Endocrinology

## 2019-11-13 ENCOUNTER — Non-Acute Institutional Stay (SKILLED_NURSING_FACILITY): Payer: Medicare Other | Admitting: Adult Health

## 2019-11-13 ENCOUNTER — Encounter: Payer: Self-pay | Admitting: Adult Health

## 2019-11-13 DIAGNOSIS — K562 Volvulus: Secondary | ICD-10-CM | POA: Diagnosis not present

## 2019-11-13 NOTE — Progress Notes (Signed)
Location:    [facility] Room Number: 112/W Place of Service:  SNF (31)   CODE STATUS: DNR  Allergies  Allergen Reactions  . Penicillin G   . Penicillins     Has patient had a PCN reaction causing immediate rash, facial/tongue/throat swelling, SOB or lightheadedness with hypotension: unknown Has patient had a PCN reaction causing severe rash involving mucus membranes or skin necrosis: unknown Has patient had a PCN reaction that required hospitalization: unknown Has patient had a PCN reaction occurring within the last 10 years: unknown If all of the above answers are "NO", then may proceed with Cephalosporin use. 5/3 Tolerated Cefepime x 1 dose in ED.     Chief Complaint  Patient presents with  . Acute Visit    Abdominal Disorder    HPI:  His abdomen is distended for the past couple of days. His abdomen is firm he is constipated. There are no reports of fevers; no nausea or vomiting present.   Past Medical History:  Diagnosis Date  . Dysphasia   . Fatty liver   . Hemiplegia (HCC)   . Hypertension   . Pneumonia   . Pulmonary embolism (HCC)   . 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(HCC)   . Stroke [facility])     Past Surgical History:  Procedure Laterality Date  . ESOPHAGOGASTRODUODENOSCOPY (EGD) WITH PROPOFOL N/A 05/15/2019   Procedure: ESOPHAGOGASTRODUODENOSCOPY (EGD) WITH PROPOFOL;  Surgeon: Lucretia Roers, MD;  Location: [facility] ORS;  Service: General;  Laterality: N/A;  . FLEXIBLE SIGMOIDOSCOPY N/A 10/21/2016   Procedure: FLEXIBLE SIGMOIDOSCOPY;  Surgeon: Malissa Hippo, MD;  [facility]: [facility];  Service: Endoscopy;  Laterality: N/A;  . FLEXIBLE SIGMOIDOSCOPY N/A 10/25/2016   Procedure: FLEXIBLE SIGMOIDOSCOPY;  Surgeon: Malissa Hippo, MD;  Location: [facility];  Service: Endoscopy;  Laterality: N/A;  . PEG PLACEMENT N/A 05/15/2019   Procedure: PERCUTANEOUS ENDOSCOPIC GASTROSTOMY (PEG) PLACEMENT;  Surgeon: Lucretia Roers, MD;  Location: [facility] ORS;  Service:  General;  Laterality: N/A;  . unable      Social History   Socioeconomic History  . Marital status: Single    Spouse name: Not on file  . Number of children: Not on file  . Years of education: Not on file  . Highest education level: Not on file  Occupational History  . Not on file  Tobacco Use  . Smoking status: Never Smoker  . Smokeless tobacco: Never Used  Substance and Sexual Activity  . Alcohol use: No    Alcohol/week: 0.0 standard drinks  . Drug use: No  . Sexual activity: Not on file  Other Topics Concern  . Not on file  Social History Narrative  . Not on file   Social Determinants of Health   Financial Resource Strain:   . Difficulty of Paying Living Expenses:   Food Insecurity:   . Worried About Programme researcher, broadcasting/film/video in the Last Year:   . Barista in the Last Year:   Transportation Needs:   . Freight forwarder (Medical):   Marland Kitchen Lack of Transportation (Non-Medical):   Physical Activity:   . Days of Exercise per Week:   . Minutes of Exercise per Session:   Stress:   . Feeling of Stress :   Social Connections:   . Frequency of Communication with Friends and Family:   . Frequency of Social Gatherings with Friends and Family:   . Attends Religious Services:   . Active Member of Clubs or  Organizations:   . Attends Archivist Meetings:   Marland Kitchen Marital Status:   Intimate Partner Violence:   . Fear of Current or Ex-Partner:   . Emotionally Abused:   Marland Kitchen Physically Abused:   . Sexually Abused:    No family history on file.    VITAL SIGNS BP 139/82   Pulse 79   Temp 98.4 F (36.9 C) (Oral)   Resp 20   Ht 5' 8.5" (1.74 m)   Wt 241 lb (109.3 kg)   SpO2 92%   BMI 36.11 kg/m   Outpatient Encounter Medications as of 11/13/2019  Medication Sig  . acetaminophen (TYLENOL) 325 MG tablet Take 650 mg by mouth every 6 (six) hours as needed for moderate pain.   Marland Kitchen apixaban (ELIQUIS) 5 MG TABS tablet Place 5 mg into feeding tube 2 (two) times daily.  For dvt/pe/cva po  . atorvastatin (LIPITOR) 10 MG tablet Take 10 mg by mouth daily.  Marland Kitchen atropine 1 % ophthalmic solution Place 1 drop under the tongue every 6 (six) hours as needed.  . bisacodyl (DULCOLAX) 10 MG suppository Place 1 suppository (10 mg total) rectally at bedtime.  . carboxymethylcellulose (REFRESH PLUS) 0.5 % SOLN Place 1 drop into the right eye in the morning, at noon, in the evening, and at bedtime.   Marland Kitchen erythromycin ophthalmic ointment Place 1 application into the right eye 4 (four) times daily. Also apply to right eye - dx corneal scar - continue until next ophthalmology visit At Bedtime  . insulin aspart (NOVOLOG FLEXPEN) 100 UNIT/ML FlexPen Inject 10 Units into the skin 3 (three) times daily with meals. 10 units if above 200; subcutaneous  Special Instructions: with meals if cbg above 200  . Insulin Glargine (BASAGLAR KWIKPEN) 100 UNIT/ML SOPN Inject 25 Units into the skin at bedtime.   Marland Kitchen ipratropium-albuterol (DUONEB) 0.5-2.5 (3) MG/3ML SOLN Take 3 mLs by nebulization every 6 (six) hours.   Marland Kitchen labetalol (NORMODYNE) 200 MG tablet Take 200 mg by mouth daily. For HTN  . lactulose (CHRONULAC) 10 GM/15ML solution Take 10 g by mouth 2 (two) times daily.  Marland Kitchen lisinopril (ZESTRIL) 2.5 MG tablet Take 2.5 mg by mouth daily. hold for systolic < 094  . loratadine (CLARITIN) 10 MG tablet Take 10 mg by mouth daily.  . methimazole (TAPAZOLE) 5 MG tablet Take 2.5 mg by mouth daily.  . NON FORMULARY Diet- Liquids: Nectar Thick Liquids  Diet:Dysphagia 1 Puree  . OXYGEN Inhale 2 L into the lungs continuous. Every shift to maintain satuation >90%.  Marland Kitchen sennosides (SENOKOT) 8.8 MG/5ML syrup Take 5 mLs by mouth 2 (two) times daily as needed for mild constipation.  . [DISCONTINUED] sennosides (SENOKOT) 8.8 MG/5ML syrup Place 5 mLs into feeding tube 2 (two) times daily as needed for mild constipation. (Patient taking differently: Take 5 mLs by mouth 2 (two) times daily as needed for mild constipation. )     No facility-administered encounter medications on file as of 11/13/2019.     SIGNIFICANT DIAGNOSTIC EXAMS  PREVIOUS;   01-16-19: chest x-ray: Infiltrate versus atelectasis at LEFT base   05-05-19: chest x-ray: 1. Normal chest x-ray.  2. No tuberculosis is noted   05-08-19: KUB: Findings suggest constipation, fecal impaction or decreased colonic motility. Correlate with risk factors for constipation or fecal stasis, based on frequency or absence of bowel movements. Overall sensitivity is decreased, due to attenuation from patient body habitus.  Soft tissue attenuation results in significantly diminished diagnostic sensitivity and utility.  05-09-19:  chest x-ray: Mild bibasilar subsegmental atelectasis or infiltrates are noted   05-09-19: ct of abdomen and pelvis:  1. Moderately large amount of stool in the distal sigmoid colon and rectum. Moderate gaseous distension of the more proximal sigmoid colon without evidence of volvulus or bowel obstruction. 2. Bibasilar lung consolidation concerning for pneumonia. 3. Aortic Atherosclerosis   07-12-18: chest x-ray: Cardiomegaly with low lung volumes and bibasilar collapse/consolidation, left greater than right.  05-16-19: chest x-ray: ETT in good position. Worsened bilateral airspace disease small pleural effusions.  05-19-19: chest x-ray: Partial but incomplete clearing of opacity from the basis. Patchy airspace opacity remains in the lower lung regions with consolidation in a portion of the left base. There are small pleural effusions bilaterally. Upper lung regions appear clear. Heart upper normal in size. No adenopathy demonstrable by radiography.  NO NEW EXAMS     LABS REVIEWED: PREVIOUS   10-26-18: wbc 8.8; hgb 15.7; hct 49.2; mcv 95.7; plt 240; glucose 74; bun 25; creat 1.05; k+ 3.9; na++ 141; ca 8.9 INR 2.1  11-27-18: INR 2.7 tsh 0.051 free T3: 3.2; free T4: 1.16 12-11-18: INR 3.3 12-13-18: INR 2.7 12-20-18: INR 2.4 tsh 0.444 free  T3: 2.4 free T4: 0.90 01-02-19: INR 2.9  01-09-19: INR 3.5 01-16-19: INR 4.4 01-18-19: INR 3.3  01-23-19: INR 1.4  02-05-19: wbc 9.9; hgb 15.3; hct 482; mcv 96.4; plt 236; glucose 99; bun 46; creat 1.54; k+ 4.0; na++ 139; ca 8.7; liver normal albumin 3.2; chol 106; ldl 62; trig 54; hdl 33; PSA 10.58 (scheduled for 03-31-19)  03-16-19: tsh 2.668 free T4: 0.81 05-01-19: PSA 11.82 05-05-19: wbc 12.3; hgb 16.7; hct 53.4; mcv 98.2 plt 246; blood culture: no growth 05-08-19: PSA 16.39 05-09-19: wbc 13.3; hgb 16.9; hct 56.5; mcv 102.7 plt 261; glucose 689; bun 82; creat 2.78; k+ 5.3; na++ 151; ca 8.9; urine culture: enterococcus faecalis:  Hgb a1c 11.3 05-11-19: glucose 98; bun 36; creat 1.61; k+ 3.8; na++ 158; ca 7.8; phos 2.0 albumin 2.6 05-15-19: wbc 8.2; hgb 12.9; hct 42.2; mcv 100.2 plt 165; glucose 178; bun 15; creat 1.37; k+ 3.5; na++ 143; ca 7.5  05-18-19: wbc 7.8; hgb 13.3; hct 43.2; mcv 99.1 plt 227; glucose 246; bun 28; creat 1.21; k+ 3.2; na++ 139 ca 7.7 05-20-19: wbc 9.5; hgb 13.3; hct 44.0; mcv 98.9 plt 264; glucose 190; bun 25; creat 1.10; k+ 3.7; na++ 138; ca 8.3 ;liver normal albumin 2.4  07-04-19 glucose 99; bun 31; creat 1.19 ;k+ 3.8; na++ 143; ca 8.8 chol 109; ldl 68 trig 57; hdl 30; urine micro-albumin 33.8 07-05-19: tsh 1.257 free T4: 0.66  10-11-19: hgb a1c 7.2  NO NEW LABS    Review of Systems  Constitutional: Negative for malaise/fatigue.  Respiratory: Negative for cough and shortness of breath.   Cardiovascular: Negative for chest pain, palpitations and leg swelling.  Gastrointestinal: Negative for abdominal pain, constipation and heartburn.  Musculoskeletal: Negative for back pain, joint pain and myalgias.  Skin: Negative.   Neurological: Negative for dizziness.  Psychiatric/Behavioral: The patient is not nervous/anxious.     Physical Exam Constitutional:      General: He is not in acute distress.    Appearance: He is well-developed. He is obese. He is not diaphoretic.    Neck:     Thyroid: No thyromegaly.  Cardiovascular:     Rate and Rhythm: Normal rate and regular rhythm.     Pulses: Normal pulses.     Heart sounds: Normal heart sounds.  Pulmonary:  Effort: Pulmonary effort is normal. No respiratory distress.     Breath sounds: Rhonchi present.  Abdominal:     General: Bowel sounds are normal. There is distension.     Tenderness: There is no abdominal tenderness.     Comments: Is firm bowel sounds present   Musculoskeletal:     Cervical back: Neck supple.     Right lower leg: No edema.     Left lower leg: No edema.     Comments: Right hemiplegia   Lymphadenopathy:     Cervical: No cervical adenopathy.  Skin:    General: Skin is warm and dry.  Neurological:     Mental Status: He is alert. Mental status is at baseline.  Psychiatric:        Mood and Affect: Mood normal.        ASSESSMENT/ PLAN:  TODAY  1. 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Is worse  Will stop senna Will begin lactulose 30 cc twice daily Will monitor      MD is aware of resident's narcotic use and is in agreement with current plan of care. We will attempt to wean resident as appropriate.  Synthia Innocent NP [facility]  Contact [phone] Monday through Friday 8am- 5pm  After hours call [phone]

## 2019-11-28 ENCOUNTER — Non-Acute Institutional Stay (SKILLED_NURSING_FACILITY): Payer: Medicare Other | Admitting: Adult Health

## 2019-11-28 ENCOUNTER — Encounter: Payer: Self-pay | Admitting: Adult Health

## 2019-11-28 DIAGNOSIS — E785 Hyperlipidemia, unspecified: Secondary | ICD-10-CM | POA: Diagnosis not present

## 2019-11-28 DIAGNOSIS — I679 Cerebrovascular disease, unspecified: Secondary | ICD-10-CM

## 2019-11-28 DIAGNOSIS — E1169 Type 2 diabetes mellitus with other specified complication: Secondary | ICD-10-CM

## 2019-11-28 DIAGNOSIS — G8191 Hemiplegia, unspecified affecting right dominant side: Secondary | ICD-10-CM | POA: Diagnosis not present

## 2019-11-28 DIAGNOSIS — I6389 Other cerebral infarction: Secondary | ICD-10-CM

## 2019-11-28 DIAGNOSIS — E0590 Thyrotoxicosis, unspecified without thyrotoxic crisis or storm: Secondary | ICD-10-CM | POA: Diagnosis not present

## 2019-11-28 NOTE — Progress Notes (Signed)
Location:   [facility] Room Number: 112/W Place of Service:  SNF (31)   CODE STATUS: dnr  Allergies  Allergen Reactions  . Penicillin G   . Penicillins     Has patient had a PCN reaction causing immediate rash, facial/tongue/throat swelling, SOB or lightheadedness with hypotension: unknown Has patient had a PCN reaction causing severe rash involving mucus membranes or skin necrosis: unknown Has patient had a PCN reaction that required hospitalization: unknown Has patient had a PCN reaction occurring within the last 10 years: unknown If all of the above answers are "NO", then may proceed with Cephalosporin use. 5/3 Tolerated Cefepime x 1 dose in ED.     Chief Complaint  Patient presents with  . 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 There are no reports of uncontrolled pain; no changes in his appetite; no reports of agitation or anxiety.   Past Medical History:  Diagnosis Date  . Dysphasia   . Fatty liver   . Hemiplegia ([place])   . Hypertension   . Pneumonia   . Pulmonary embolism ([place])   . 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([place])   . Stroke [facility])     Past Surgical History:  Procedure Laterality Date  . ESOPHAGOGASTRODUODENOSCOPY (EGD) WITH PROPOFOL N/A 05/15/2019   Procedure: ESOPHAGOGASTRODUODENOSCOPY (EGD) WITH PROPOFOL;  Surgeon: Virl Cagey, MD;  Location: [facility];  Service: General;  Laterality: N/A;  . FLEXIBLE SIGMOIDOSCOPY N/A 10/21/2016   Procedure: FLEXIBLE SIGMOIDOSCOPY;  Surgeon: Rogene Houston, MD;  [facility]: [facility];  Service: Endoscopy;  Laterality: N/A;  . FLEXIBLE SIGMOIDOSCOPY N/A  10/25/2016   Procedure: FLEXIBLE SIGMOIDOSCOPY;  Surgeon: Rogene Houston, MD;  Location: [facility];  Service: Endoscopy;  Laterality: N/A;  . PEG PLACEMENT N/A 05/15/2019   Procedure: PERCUTANEOUS ENDOSCOPIC GASTROSTOMY (PEG) PLACEMENT;  Surgeon: Virl Cagey, MD;  Location: [facility];  Service: General;  Laterality: N/A;  . unable      Social History   Socioeconomic History  . Marital status: Single    Spouse name: Not on file  . Number of children: Not on file  . Years of education: Not on file  . Highest education level: Not on file  Occupational History  . Not on file  Tobacco Use  . Smoking status: Never Smoker  . Smokeless tobacco: Never Used  Substance and Sexual Activity  . Alcohol use: No    Alcohol/week: 0.0 standard drinks  . Drug use: No  . Sexual activity: Not on file  Other Topics Concern  . Not on file  Social History Narrative  . Not on file   Social Determinants of Health   Financial Resource Strain:   . Difficulty of Paying Living Expenses:   Food Insecurity:   . Worried About Charity fundraiser in the Last Year:   . Arboriculturist in the Last Year:   Transportation Needs:   . Film/video editor (Medical):   Marland Kitchen Lack of Transportation (Non-Medical):   Physical Activity:   . Days of Exercise per Week:   . Minutes of Exercise per Session:   Stress:   .  Feeling of Stress :   Social Connections:   . Frequency of Communication with Friends and Family:   . Frequency of Social Gatherings with Friends and Family:   . Attends Religious Services:   . Active Member of Clubs or Organizations:   . Attends Banker Meetings:   Marland Kitchen Marital Status:   Intimate Partner Violence:   . Fear of Current or Ex-Partner:   . Emotionally Abused:   Marland Kitchen Physically Abused:   . Sexually Abused:    No family history on file.    VITAL SIGNS BP 140/89   Pulse 87   Temp 98.6 F (37 C) (Oral)   Resp 20   Ht 5' 8.5" (1.74 m)   Wt 243 lb 9.6 oz  (110.5 kg)   SpO2 96%   BMI 36.50 kg/m   Outpatient Encounter Medications as of 11/28/2019  Medication Sig  . acetaminophen (TYLENOL) 325 MG tablet Take 650 mg by mouth every 6 (six) hours as needed for moderate pain.   Marland Kitchen apixaban (ELIQUIS) 5 MG TABS tablet Place 5 mg into feeding tube 2 (two) times daily. For dvt/pe/cva po  . atorvastatin (LIPITOR) 10 MG tablet Take 10 mg by mouth daily.  Marland Kitchen atropine 1 % ophthalmic solution Place 1 drop under the tongue every 6 (six) hours as needed.  . bisacodyl (DULCOLAX) 10 MG suppository Place 1 suppository (10 mg total) rectally at bedtime.  . carboxymethylcellulose (REFRESH PLUS) 0.5 % SOLN Place 1 drop into the right eye in the morning, at noon, in the evening, and at bedtime.   Marland Kitchen erythromycin ophthalmic ointment Place 1 application into the right eye 4 (four) times daily. Also apply to right eye - dx corneal scar - continue until next ophthalmology visit At Bedtime  . insulin aspart (NOVOLOG FLEXPEN) 100 UNIT/ML FlexPen Inject 10 Units into the skin 3 (three) times daily with meals. 10 units if above 200; subcutaneous  Special Instructions: with meals if cbg above 200  . Insulin Glargine (BASAGLAR KWIKPEN) 100 UNIT/ML SOPN Inject 25 Units into the skin at bedtime.   Marland Kitchen ipratropium-albuterol (DUONEB) 0.5-2.5 (3) MG/3ML SOLN Take 3 mLs by nebulization every 6 (six) hours.   Marland Kitchen labetalol (NORMODYNE) 200 MG tablet Take 200 mg by mouth daily. For HTN  . lactulose (CHRONULAC) 10 GM/15ML solution Take 10 g by mouth 2 (two) times daily.  Marland Kitchen lisinopril (ZESTRIL) 2.5 MG tablet Take 2.5 mg by mouth daily. hold for systolic < 110  . loratadine (CLARITIN) 10 MG tablet Take 10 mg by mouth daily.  . methimazole (TAPAZOLE) 5 MG tablet Take 2.5 mg by mouth daily.  . NON FORMULARY Diet- Liquids: Nectar Thick Liquids  Diet:Dysphagia 1 Puree  . OXYGEN Inhale 2 L into the lungs continuous. Every shift to maintain satuation >90%.  Marland Kitchen sennosides (SENOKOT) 8.8 MG/5ML syrup  Take 5 mLs by mouth 2 (two) times daily as needed for mild constipation.   No facility-administered encounter medications on file as of 11/28/2019.     SIGNIFICANT DIAGNOSTIC EXAMS  PREVIOUS;   01-16-19: chest x-ray: Infiltrate versus atelectasis at LEFT base   05-05-19: chest x-ray: 1. Normal chest x-ray.  2. No tuberculosis is noted   05-08-19: KUB: Findings suggest constipation, fecal impaction or decreased colonic motility. Correlate with risk factors for constipation or fecal stasis, based on frequency or absence of bowel movements. Overall sensitivity is decreased, due to attenuation from patient body habitus.  Soft tissue attenuation results in significantly diminished diagnostic sensitivity and  utility.  05-09-19: chest x-ray: Mild bibasilar subsegmental atelectasis or infiltrates are noted   05-09-19: ct of abdomen and pelvis:  1. Moderately large amount of stool in the distal sigmoid colon and rectum. Moderate gaseous distension of the more proximal sigmoid colon without evidence of volvulus or bowel obstruction. 2. Bibasilar lung consolidation concerning for pneumonia. 3. Aortic Atherosclerosis   07-12-18: chest x-ray: Cardiomegaly with low lung volumes and bibasilar collapse/consolidation, left greater than right.  05-16-19: chest x-ray: ETT in good position. Worsened bilateral airspace disease small pleural effusions.  05-19-19: chest x-ray: Partial but incomplete clearing of opacity from the basis. Patchy airspace opacity remains in the lower lung regions with consolidation in a portion of the left base. There are small pleural effusions bilaterally. Upper lung regions appear clear. Heart upper normal in size. No adenopathy demonstrable by radiography.  NO NEW EXAMS     LABS REVIEWED: PREVIOUS   11-27-18: INR 2.7 tsh 0.051 free T3: 3.2; free T4: 1.16 12-11-18: INR 3.3 12-13-18: INR 2.7 12-20-18: INR 2.4 tsh 0.444 free T3: 2.4 free T4: 0.90 01-02-19: INR 2.9  01-09-19:  INR 3.5 01-16-19: INR 4.4 01-18-19: INR 3.3  01-23-19: INR 1.4  02-05-19: wbc 9.9; hgb 15.3; hct 482; mcv 96.4; plt 236; glucose 99; bun 46; creat 1.54; k+ 4.0; na++ 139; ca 8.7; liver normal albumin 3.2; chol 106; ldl 62; trig 54; hdl 33; PSA 10.58 (scheduled for 03-31-19)  03-16-19: tsh 2.668 free T4: 0.81 05-01-19: PSA 11.82 05-05-19: wbc 12.3; hgb 16.7; hct 53.4; mcv 98.2 plt 246; blood culture: no growth 05-08-19: PSA 16.39 05-09-19: wbc 13.3; hgb 16.9; hct 56.5; mcv 102.7 plt 261; glucose 689; bun 82; creat 2.78; k+ 5.3; na++ 151; ca 8.9; urine culture: enterococcus faecalis:  Hgb a1c 11.3 05-11-19: glucose 98; bun 36; creat 1.61; k+ 3.8; na++ 158; ca 7.8; phos 2.0 albumin 2.6 05-15-19: wbc 8.2; hgb 12.9; hct 42.2; mcv 100.2 plt 165; glucose 178; bun 15; creat 1.37; k+ 3.5; na++ 143; ca 7.5  05-18-19: wbc 7.8; hgb 13.3; hct 43.2; mcv 99.1 plt 227; glucose 246; bun 28; creat 1.21; k+ 3.2; na++ 139 ca 7.7 05-20-19: wbc 9.5; hgb 13.3; hct 44.0; mcv 98.9 plt 264; glucose 190; bun 25; creat 1.10; k+ 3.7; na++ 138; ca 8.3 ;liver normal albumin 2.4  07-04-19 glucose 99; bun 31; creat 1.19 ;k+ 3.8; na++ 143; ca 8.8 chol 109; ldl 68 trig 57; hdl 30; urine micro-albumin 33.8 07-05-19: tsh 1.257 free T4: 0.66  10-11-19: hgb a1c 7.2    TODAY  11-05-19: tsh 2.340   Review of Systems  Constitutional: Negative for malaise/fatigue.  Respiratory: Negative for cough and shortness of breath.   Cardiovascular: Negative for chest pain, palpitations and leg swelling.  Gastrointestinal: Negative for abdominal pain, constipation and heartburn.  Musculoskeletal: Negative for back pain, joint pain and myalgias.  Skin: Negative.   Neurological: Negative for dizziness.  Psychiatric/Behavioral: The patient is not nervous/anxious.       Physical Exam Constitutional:      General: He is not in acute distress.    Appearance: He is well-developed. He is obese. He is not diaphoretic.  Neck:     Thyroid: No  thyromegaly.  Cardiovascular:     Rate and Rhythm: Normal rate and regular rhythm.     Heart sounds: Normal heart sounds.  Pulmonary:     Effort: Pulmonary effort is normal. No respiratory distress.     Breath sounds: Rhonchi present.  Abdominal:  General: Bowel sounds are normal. There is distension.     Palpations: Abdomen is soft.     Tenderness: There is no abdominal tenderness.  Musculoskeletal:     Right lower leg: No edema.     Left lower leg: No edema.     Comments: Right hemiplegia   Lymphadenopathy:     Cervical: No cervical adenopathy.  Skin:    General: Skin is warm and dry.  Neurological:     Mental Status: He is alert. Mental status is at baseline.  Psychiatric:        Mood and Affect: Mood normal.      ASSESSMENT/ PLAN:  TODAY:  1. 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 Hyperlipidemia associated with type 2 diabetes mellitus: is stable LDL 68 will continue lipitor 10 mg daily   3. Hyperthyroidism: is stable tsh 2.340 free t4: 0.66; will continue tapazole 2.5 mg daily   PREVIOUS   4. Sigmoid volvulus: is stable wll continue senna twice daily as needed  5. Deep vein thrombosis (DVT) of left extremity unspecified vein/other chronic pulmonary embolism without acute cor pulmonale: is stable is on long term eliquis 5 mg twice daily   6. Type 2 diabetes mellitus new onset with long term current use of insulin: hgb a1c 7.2 will continue basaglar 25 units nightly and humalog 15 units with meals.   Is on ace statin and eliquis   7. 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 Dysphagia due to old cerebrovascular accident on honey thick liquids: stable chronically aspirates; honey thick liquids no signs of aspiration  present.   9. Hypertension associated with type 2 diabetes mellitus is stable b/p 140/89 will continue labetalol 200 mg daily lisinopril 2.5 mg daily      MD is aware of resident's narcotic use and is in agreement with current plan of care. We will attempt to wean resident as appropriate.  Synthia Innocent NP [facility]  Contact [phone] Monday through Friday 8am- 5pm  After hours call [phone]

## 2019-11-29 DIAGNOSIS — I639 Cerebral infarction, unspecified: Secondary | ICD-10-CM | POA: Insufficient documentation

## 2019-12-13 ENCOUNTER — Encounter: Payer: Self-pay | Admitting: Adult Health

## 2019-12-13 ENCOUNTER — Non-Acute Institutional Stay (SKILLED_NURSING_FACILITY): Payer: Medicare Other | Admitting: Adult Health

## 2019-12-13 DIAGNOSIS — I69391 Dysphagia following cerebral infarction: Secondary | ICD-10-CM | POA: Diagnosis not present

## 2019-12-13 DIAGNOSIS — I679 Cerebrovascular disease, unspecified: Secondary | ICD-10-CM | POA: Diagnosis not present

## 2019-12-13 DIAGNOSIS — J410 Simple chronic bronchitis: Secondary | ICD-10-CM

## 2019-12-13 DIAGNOSIS — G8191 Hemiplegia, unspecified affecting right dominant side: Secondary | ICD-10-CM | POA: Diagnosis not present

## 2019-12-13 NOTE — Progress Notes (Signed)
Location:    [facility] Room Number: 112/W Place of Service:  SNF (31)   CODE STATUS: DNR  Allergies  Allergen Reactions  . Penicillin G   . Penicillins     Has patient had a PCN reaction causing immediate rash, facial/tongue/throat swelling, SOB or lightheadedness with hypotension: unknown Has patient had a PCN reaction causing severe rash involving mucus membranes or skin necrosis: unknown Has patient had a PCN reaction that required hospitalization: unknown Has patient had a PCN reaction occurring within the last 10 years: unknown If all of the above answers are "NO", then may proceed with Cephalosporin use. 5/3 Tolerated Cefepime x 1 dose in ED.     Chief Complaint  Patient presents with  . Acute Visit    Care Plan Meeting    HPI:  We have come together for his care plan meeting. Family present BIMS 9/15 mood 0/30. No falls; his weight is stable; has a good appetite. He is incontinent of bladder and bowel; is totally dependent upon staff for his adls. There are no reports of aspiration present. No reports of uncontrolled pain. 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 Dysphasia   . Fatty liver   . Hemiplegia ([facility])   . Hypertension   . Pneumonia   . Pulmonary embolism ([facility])   . 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([facility])   . Stroke [facility])     Past Surgical History:  Procedure Laterality Date  . ESOPHAGOGASTRODUODENOSCOPY (EGD) WITH PROPOFOL N/A 05/15/2019   Procedure: ESOPHAGOGASTRODUODENOSCOPY (EGD) WITH PROPOFOL;  Surgeon: Lucretia Roers, MD;  Location: [facility];  Service: General;  Laterality: N/A;  . FLEXIBLE SIGMOIDOSCOPY N/A 10/21/2016   Procedure: FLEXIBLE SIGMOIDOSCOPY;  Surgeon: Malissa Hippo, MD;  Location: [facility];  Service: Endoscopy;   Laterality: N/A;  . FLEXIBLE SIGMOIDOSCOPY N/A 10/25/2016   Procedure: FLEXIBLE SIGMOIDOSCOPY;  Surgeon: Malissa Hippo, MD;  Location: [facility];  Service: Endoscopy;  Laterality: N/A;  . PEG PLACEMENT N/A 05/15/2019   Procedure: PERCUTANEOUS ENDOSCOPIC GASTROSTOMY (PEG) PLACEMENT;  Surgeon: Lucretia Roers, MD;  Location: [facility];  Service: General;  Laterality: N/A;  . unable      Social History   Socioeconomic History  . Marital status: Single    Spouse name: Not on file  . Number of children: Not on file  . Years of education: Not on file  . Highest education level: Not on file  Occupational History  . Not on file  Tobacco Use  . Smoking status: Never Smoker  . Smokeless tobacco: Never Used  Vaping Use  . Vaping Use: Never used  Substance and Sexual Activity  . Alcohol use: No    Alcohol/week: 0.0 standard drinks  . Drug use: No  . Sexual activity: Not on file  Other Topics Concern  . Not on file  Social History Narrative  . Not on file   Social Determinants of Health   Financial Resource Strain:   . Difficulty of Paying Living Expenses:   Food Insecurity:   . Worried About Programme researcher, broadcasting/film/video in the Last Year:   . Barista in the Last Year:   Transportation Needs:   . Freight forwarder (Medical):   Marland Kitchen Lack of Transportation (Non-Medical):   Physical Activity:   .  Days of Exercise per Week:   . Minutes of Exercise per Session:   Stress:   . Feeling of Stress :   Social Connections:   . Frequency of Communication with Friends and Family:   . Frequency of Social Gatherings with Friends and Family:   . Attends Religious Services:   . Active Member of Clubs or Organizations:   . Attends Archivist Meetings:   Marland Kitchen Marital Status:   Intimate Partner Violence:   . Fear of Current or Ex-Partner:   . Emotionally Abused:   Marland Kitchen Physically Abused:   . Sexually Abused:    No family history on file.    VITAL SIGNS BP 130/78   Pulse 78    Temp (!) 97.4 F (36.3 C)   Resp 20   Ht 5' 8.5" (1.74 m)   Wt 243 lb 9.6 oz (110.5 kg)   SpO2 96%   BMI 36.50 kg/m   Outpatient Encounter Medications as of 12/13/2019  Medication Sig  . acetaminophen (TYLENOL) 325 MG tablet Take 650 mg by mouth every 6 (six) hours as needed for moderate pain.   Marland Kitchen apixaban (ELIQUIS) 5 MG TABS tablet Place 5 mg into feeding tube 2 (two) times daily. For dvt/pe/cva po  . atorvastatin (LIPITOR) 10 MG tablet Take 10 mg by mouth daily.  Marland Kitchen atropine 1 % ophthalmic solution Place 1 drop under the tongue every 6 (six) hours as needed.  . bisacodyl (DULCOLAX) 10 MG suppository Place 1 suppository (10 mg total) rectally at bedtime.  . carboxymethylcellulose (REFRESH PLUS) 0.5 % SOLN Place 1 drop into the right eye in the morning, at noon, in the evening, and at bedtime.   Marland Kitchen erythromycin ophthalmic ointment Place 1 application into the right eye 4 (four) times daily. Also apply to right eye - dx corneal scar - continue until next ophthalmology visit At Bedtime  . insulin aspart (NOVOLOG FLEXPEN) 100 UNIT/ML FlexPen Inject 10 Units into the skin 3 (three) times daily with meals. 10 units if above 200; subcutaneous  Special Instructions: with meals if cbg above 200  . Insulin Glargine (BASAGLAR KWIKPEN) 100 UNIT/ML SOPN Inject 25 Units into the skin at bedtime.   Marland Kitchen ipratropium-albuterol (DUONEB) 0.5-2.5 (3) MG/3ML SOLN Take 3 mLs by nebulization every 6 (six) hours.   Marland Kitchen labetalol (NORMODYNE) 200 MG tablet Take 200 mg by mouth daily. For HTN  . lactulose (CHRONULAC) 10 GM/15ML solution Take 10 g by mouth 2 (two) times daily.  Marland Kitchen lisinopril (ZESTRIL) 2.5 MG tablet Take 2.5 mg by mouth daily. hold for systolic < 440  . loratadine (CLARITIN) 10 MG tablet Take 10 mg by mouth daily.  . methimazole (TAPAZOLE) 5 MG tablet Take 2.5 mg by mouth daily.  . NON FORMULARY Diet- Liquids: Nectar Thick Liquids  Diet:Dysphagia 1 Puree  . OXYGEN Inhale 2 L into the lungs continuous.  Every shift to maintain satuation >90%.  Marland Kitchen sennosides (SENOKOT) 8.8 MG/5ML syrup Take 5 mLs by mouth 2 (two) times daily as needed for mild constipation.  . Vitamins A & D (VITAMIN A & D) ointment Apply 1 application topically as needed for dry skin. To left heel qshift for prevention.   No facility-administered encounter medications on file as of 12/13/2019.     SIGNIFICANT DIAGNOSTIC EXAMS   PREVIOUS;   01-16-19: chest x-ray: Infiltrate versus atelectasis at LEFT base   05-05-19: chest x-ray: 1. Normal chest x-ray.  2. No tuberculosis is noted   05-08-19: KUB: Findings suggest  constipation, fecal impaction or decreased colonic motility. Correlate with risk factors for constipation or fecal stasis, based on frequency or absence of bowel movements. Overall sensitivity is decreased, due to attenuation from patient body habitus.  Soft tissue attenuation results in significantly diminished diagnostic sensitivity and utility.  05-09-19: chest x-ray: Mild bibasilar subsegmental atelectasis or infiltrates are noted   05-09-19: ct of abdomen and pelvis:  1. Moderately large amount of stool in the distal sigmoid colon and rectum. Moderate gaseous distension of the more proximal sigmoid colon without evidence of volvulus or bowel obstruction. 2. Bibasilar lung consolidation concerning for pneumonia. 3. Aortic Atherosclerosis   07-12-18: chest x-ray: Cardiomegaly with low lung volumes and bibasilar collapse/consolidation, left greater than right.  05-16-19: chest x-ray: ETT in good position. Worsened bilateral airspace disease small pleural effusions.  05-19-19: chest x-ray: Partial but incomplete clearing of opacity from the basis. Patchy airspace opacity remains in the lower lung regions with consolidation in a portion of the left base. There are small pleural effusions bilaterally. Upper lung regions appear clear. Heart upper normal in size. No adenopathy demonstrable by radiography.  NO  NEW EXAMS     LABS REVIEWED: PREVIOUS   11-27-18: INR 2.7 tsh 0.051 free T3: 3.2; free T4: 1.16 12-11-18: INR 3.3 12-13-18: INR 2.7 12-20-18: INR 2.4 tsh 0.444 free T3: 2.4 free T4: 0.90 01-02-19: INR 2.9  01-09-19: INR 3.5 01-16-19: INR 4.4 01-18-19: INR 3.3  01-23-19: INR 1.4  02-05-19: wbc 9.9; hgb 15.3; hct 482; mcv 96.4; plt 236; glucose 99; bun 46; creat 1.54; k+ 4.0; na++ 139; ca 8.7; liver normal albumin 3.2; chol 106; ldl 62; trig 54; hdl 33; PSA 10.58 (scheduled for 03-31-19)  03-16-19: tsh 2.668 free T4: 0.81 05-01-19: PSA 11.82 05-05-19: wbc 12.3; hgb 16.7; hct 53.4; mcv 98.2 plt 246; blood culture: no growth 05-08-19: PSA 16.39 05-09-19: wbc 13.3; hgb 16.9; hct 56.5; mcv 102.7 plt 261; glucose 689; bun 82; creat 2.78; k+ 5.3; na++ 151; ca 8.9; urine culture: enterococcus faecalis:  Hgb a1c 11.3 05-11-19: glucose 98; bun 36; creat 1.61; k+ 3.8; na++ 158; ca 7.8; phos 2.0 albumin 2.6 05-15-19: wbc 8.2; hgb 12.9; hct 42.2; mcv 100.2 plt 165; glucose 178; bun 15; creat 1.37; k+ 3.5; na++ 143; ca 7.5  05-18-19: wbc 7.8; hgb 13.3; hct 43.2; mcv 99.1 plt 227; glucose 246; bun 28; creat 1.21; k+ 3.2; na++ 139 ca 7.7 05-20-19: wbc 9.5; hgb 13.3; hct 44.0; mcv 98.9 plt 264; glucose 190; bun 25; creat 1.10; k+ 3.7; na++ 138; ca 8.3 ;liver normal albumin 2.4  07-04-19 glucose 99; bun 31; creat 1.19 ;k+ 3.8; na++ 143; ca 8.8 chol 109; ldl 68 trig 57; hdl 30; urine micro-albumin 33.8 07-05-19: tsh 1.257 free T4: 0.66  10-11-19: hgb a1c 7.2   11-05-19: tsh 2.340  NO NEW LABS.   Review of Systems  Constitutional: Negative for malaise/fatigue.  Respiratory: Negative for cough and shortness of breath.   Cardiovascular: Negative for chest pain, palpitations and leg swelling.  Gastrointestinal: Negative for abdominal pain, constipation and heartburn.  Musculoskeletal: Negative for back pain, joint pain and myalgias.  Skin: Negative.   Neurological: Negative for dizziness.  Psychiatric/Behavioral: The  patient is not nervous/anxious.      Physical Exam Constitutional:      General: He is not in acute distress.    Appearance: He is well-developed. He is obese. He is not diaphoretic.  Neck:     Thyroid: No thyromegaly.  Cardiovascular:  Rate and Rhythm: Normal rate and regular rhythm.     Heart sounds: Normal heart sounds.  Pulmonary:     Effort: Pulmonary effort is normal. No respiratory distress.     Breath sounds: Rhonchi present.  Abdominal:     General: Bowel sounds are normal. There is no distension.     Palpations: Abdomen is soft.     Tenderness: There is no abdominal tenderness.  Musculoskeletal:     Right lower leg: No edema.     Left lower leg: No edema.     Comments: Right hemiplegia    Lymphadenopathy:     Cervical: No cervical adenopathy.  Skin:    General: Skin is warm and dry.  Neurological:     Mental Status: He is alert. Mental status is at baseline.  Psychiatric:        Mood and Affect: Mood normal.         ASSESSMENT/ PLAN:  TODAY  1 hemiplegia of right dominant side due to cerebrovascular disease 2. Simple chronic bronchitis 3. dysphagia due to old cerebrovascular accident  Will continue current medications Will continue current plan of care Will continue to monitor his status.     MD is aware of resident's narcotic use and is in agreement with current plan of care. We will attempt to wean resident as appropriate.  Synthia Innocent NP [facility]  Contact [phone] Monday through Friday 8am- 5pm  After hours call [phone]

## 2019-12-27 ENCOUNTER — Non-Acute Institutional Stay (SKILLED_NURSING_FACILITY): Payer: Medicare Other | Admitting: Adult Health

## 2019-12-27 ENCOUNTER — Encounter: Payer: Self-pay | Admitting: Adult Health

## 2019-12-27 DIAGNOSIS — E1149 Type 2 diabetes mellitus with other diabetic neurological complication: Secondary | ICD-10-CM | POA: Diagnosis not present

## 2019-12-27 DIAGNOSIS — I82502 Chronic embolism and thrombosis of unspecified deep veins of left lower extremity: Secondary | ICD-10-CM | POA: Diagnosis not present

## 2019-12-27 DIAGNOSIS — K562 Volvulus: Secondary | ICD-10-CM

## 2019-12-27 DIAGNOSIS — E1165 Type 2 diabetes mellitus with hyperglycemia: Secondary | ICD-10-CM

## 2019-12-27 DIAGNOSIS — I2782 Chronic pulmonary embolism: Secondary | ICD-10-CM

## 2019-12-27 DIAGNOSIS — IMO0002 Reserved for concepts with insufficient information to code with codable children: Secondary | ICD-10-CM

## 2019-12-27 DIAGNOSIS — Z794 Long term (current) use of insulin: Secondary | ICD-10-CM

## 2019-12-27 NOTE — Progress Notes (Signed)
Location:    [facility] Room Number: 112/W Place of Service:  SNF (31)   CODE STATUS: DNR  Allergies  Allergen Reactions  . Penicillin G   . Penicillins     Has patient had a PCN reaction causing immediate rash, facial/tongue/throat swelling, SOB or lightheadedness with hypotension: unknown Has patient had a PCN reaction causing severe rash involving mucus membranes or skin necrosis: unknown Has patient had a PCN reaction that required hospitalization: unknown Has patient had a PCN reaction occurring within the last 10 years: unknown If all of the above answers are "NO", then may proceed with Cephalosporin use. 5/3 Tolerated Cefepime x 1 dose in ED.     Chief Complaint  Patient presents with  . 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 There are no reports of constipation; no reports of diarrhea. No reports of uncontrolled pain. No reports of agitation.   Past Medical History:  Diagnosis Date  . Dysphasia   . Fatty liver   . Hemiplegia (HCC)   . Hypertension   . Pneumonia   . Pulmonary embolism (HCC)   . 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(HCC)   . Stroke [facility])     Past Surgical History:  Procedure Laterality Date  . ESOPHAGOGASTRODUODENOSCOPY (EGD) WITH PROPOFOL N/A 05/15/2019   Procedure: ESOPHAGOGASTRODUODENOSCOPY (EGD) WITH PROPOFOL;  Surgeon: Lucretia Roers, MD;  Location: [facility] ORS;  Service: General;  Laterality: N/A;  . FLEXIBLE SIGMOIDOSCOPY N/A 10/21/2016   Procedure: FLEXIBLE SIGMOIDOSCOPY;  Surgeon: Malissa Hippo, MD;  Location: [facility] ENDO SUITE;  Service:  Endoscopy;  Laterality: N/A;  . FLEXIBLE SIGMOIDOSCOPY N/A 10/25/2016   Procedure: FLEXIBLE SIGMOIDOSCOPY;  Surgeon: Malissa Hippo, MD;  Location: [facility] END[facility] SUITE;  Service: Endoscopy;  Laterality: N/A;  . PEG PLACEMENT N/A 05/15/2019   Procedure: PERCUTANEOUS ENDOSCOPIC GASTROSTOMY (PEG) PLACEMENT;  Surgeon: Lucretia Roers, MD;  Location: [facility] ORS;  Service: General;  Laterality: N/A;  . unable      Social History   Socioeconomic History  . Marital status: Single    Spouse name: Not on file  . Number of children: Not on file  . Years of education: Not on file  . Highest education level: Not on file  Occupational History  . Not on file  Tobacco Use  . Smoking status: Never Smoker  . Smokeless tobacco: Never Used  Vaping Use  . Vaping Use: Never used  Substance and Sexual Activity  . Alcohol use: No    Alcohol/week: 0.0 standard drinks  . Drug use: No  . Sexual activity: Not on file  Other Topics Concern  . Not on file  Social History Narrative  . Not on file   Social Determinants of Health   Financial Resource Strain:   . Difficulty of Paying Living Expenses:   Food Insecurity:   . Worried About Programme researcher, broadcasting/film/video in the Last Year:   . Barista in the Last Year:   Transportation Needs:   . Freight forwarder (Medical):   Marland Kitchen Lack of  Transportation (Non-Medical):   Physical Activity:   . Days of Exercise per Week:   . Minutes of Exercise per Session:   Stress:   . Feeling of Stress :   Social Connections:   . Frequency of Communication with Friends and Family:   . Frequency of Social Gatherings with Friends and Family:   . Attends Religious Services:   . Active Member of Clubs or Organizations:   . Attends Banker Meetings:   Marland Kitchen Marital Status:   Intimate Partner Violence:   . Fear of Current or Ex-Partner:   . Emotionally Abused:   Marland Kitchen Physically Abused:   . Sexually Abused:    No family history on file.    VITAL SIGNS BP (!)  142/88   Pulse 76   Temp 98.3 F (36.8 C) (Oral)   Resp (!) 22   Ht 5' 8.5" (1.74 m)   Wt 242 lb 9.6 oz (110 kg)   SpO2 97%   BMI 36.35 kg/m   Outpatient Encounter Medications as of 12/27/2019  Medication Sig  . acetaminophen (TYLENOL) 325 MG tablet Take 650 mg by mouth every 6 (six) hours as needed for moderate pain.   Marland Kitchen apixaban (ELIQUIS) 5 MG TABS tablet Place 5 mg into feeding tube 2 (two) times daily. For dvt/pe/cva po  . atorvastatin (LIPITOR) 10 MG tablet Take 10 mg by mouth daily.  Marland Kitchen atropine 1 % ophthalmic solution Place 1 drop under the tongue every 6 (six) hours as needed.  . bisacodyl (DULCOLAX) 10 MG suppository Place 1 suppository (10 mg total) rectally at bedtime.  . carboxymethylcellulose (REFRESH PLUS) 0.5 % SOLN Place 1 drop into the right eye in the morning, at noon, in the evening, and at bedtime.   Marland Kitchen erythromycin ophthalmic ointment Place 1 application into the right eye 4 (four) times daily. Also apply to right eye - dx corneal scar - continue until next ophthalmology visit At Bedtime  . insulin aspart (NOVOLOG FLEXPEN) 100 UNIT/ML FlexPen Inject 10 Units into the skin 3 (three) times daily with meals. 10 units if above 200; subcutaneous  Special Instructions: with meals if cbg above 200  . Insulin Glargine (BASAGLAR KWIKPEN) 100 UNIT/ML SOPN Inject 25 Units into the skin at bedtime.   Marland Kitchen ipratropium-albuterol (DUONEB) 0.5-2.5 (3) MG/3ML SOLN Take 3 mLs by nebulization every 6 (six) hours.   Marland Kitchen labetalol (NORMODYNE) 200 MG tablet Take 200 mg by mouth daily. For HTN  . lactulose (CHRONULAC) 10 GM/15ML solution Take by mouth 2 (two) times daily. 30 cc  . lisinopril (ZESTRIL) 2.5 MG tablet Take 2.5 mg by mouth daily. hold for systolic < 110  . loratadine (CLARITIN) 10 MG tablet Take 10 mg by mouth daily.  . methimazole (TAPAZOLE) 5 MG tablet Take 2.5 mg by mouth daily.  . NON FORMULARY Diet- Liquids: Nectar Thick Liquids  Diet:Dysphagia 1 Puree  . OXYGEN Inhale 2 L  into the lungs continuous. Every shift to maintain satuation >90%.  Marland Kitchen sennosides (SENOKOT) 8.8 MG/5ML syrup Take 5 mLs by mouth 2 (two) times daily as needed for mild constipation.  . Vitamins A & D (VITAMIN A & D) ointment Apply 1 application topically as needed for dry skin. To left heel qshift for prevention.   No facility-administered encounter medications on file as of 12/27/2019.     SIGNIFICANT DIAGNOSTIC EXAMS   PREVIOUS;   01-16-19: chest x-ray: Infiltrate versus atelectasis at LEFT base   05-05-19: chest x-ray: 1. Normal chest x-ray.  2. No tuberculosis is noted   05-08-19: KUB: Findings suggest constipation, fecal impaction or decreased colonic motility. Correlate with risk factors for constipation or fecal stasis, based on frequency or absence of bowel movements. Overall sensitivity is decreased, due to attenuation from patient body habitus.  Soft tissue attenuation results in significantly diminished diagnostic sensitivity and utility.  05-09-19: chest x-ray: Mild bibasilar subsegmental atelectasis or infiltrates are noted   05-09-19: ct of abdomen and pelvis:  1. Moderately large amount of stool in the distal sigmoid colon and rectum. Moderate gaseous distension of the more proximal sigmoid colon without evidence of volvulus or bowel obstruction. 2. Bibasilar lung consolidation concerning for pneumonia. 3. Aortic Atherosclerosis   07-12-18: chest x-ray: Cardiomegaly with low lung volumes and bibasilar collapse/consolidation, left greater than right.  05-16-19: chest x-ray: ETT in good position. Worsened bilateral airspace disease small pleural effusions.  05-19-19: chest x-ray: Partial but incomplete clearing of opacity from the basis. Patchy airspace opacity remains in the lower lung regions with consolidation in a portion of the left base. There are small pleural effusions bilaterally. Upper lung regions appear clear. Heart upper normal in size. No adenopathy  demonstrable by radiography.  NO NEW EXAMS     LABS REVIEWED: PREVIOUS   12-20-18: INR 2.4 tsh 0.444 free T3: 2.4 free T4: 0.90 01-02-19: INR 2.9  01-09-19: INR 3.5 01-16-19: INR 4.4 01-18-19: INR 3.3  01-23-19: INR 1.4  02-05-19: wbc 9.9; hgb 15.3; hct 482; mcv 96.4; plt 236; glucose 99; bun 46; creat 1.54; k+ 4.0; na++ 139; ca 8.7; liver normal albumin 3.2; chol 106; ldl 62; trig 54; hdl 33; PSA 10.58 (scheduled for 03-31-19)  03-16-19: tsh 2.668 free T4: 0.81 05-01-19: PSA 11.82 05-05-19: wbc 12.3; hgb 16.7; hct 53.4; mcv 98.2 plt 246; blood culture: no growth 05-08-19: PSA 16.39 05-09-19: wbc 13.3; hgb 16.9; hct 56.5; mcv 102.7 plt 261; glucose 689; bun 82; creat 2.78; k+ 5.3; na++ 151; ca 8.9; urine culture: enterococcus faecalis:  Hgb a1c 11.3 05-11-19: glucose 98; bun 36; creat 1.61; k+ 3.8; na++ 158; ca 7.8; phos 2.0 albumin 2.6 05-15-19: wbc 8.2; hgb 12.9; hct 42.2; mcv 100.2 plt 165; glucose 178; bun 15; creat 1.37; k+ 3.5; na++ 143; ca 7.5  05-18-19: wbc 7.8; hgb 13.3; hct 43.2; mcv 99.1 plt 227; glucose 246; bun 28; creat 1.21; k+ 3.2; na++ 139 ca 7.7 05-20-19: wbc 9.5; hgb 13.3; hct 44.0; mcv 98.9 plt 264; glucose 190; bun 25; creat 1.10; k+ 3.7; na++ 138; ca 8.3 ;liver normal albumin 2.4  07-04-19 glucose 99; bun 31; creat 1.19 ;k+ 3.8; na++ 143; ca 8.8 chol 109; ldl 68 trig 57; hdl 30; urine micro-albumin 33.8 07-05-19: tsh 1.257 free T4: 0.66  10-11-19: hgb a1c 7.2   11-05-19: tsh 2.340  NO NEW LABS.   Physical Exam Constitutional:      General: He is not in acute distress.    Appearance: He is well-developed. He is obese. He is not diaphoretic.  Neck:     Thyroid: No thyromegaly.  Cardiovascular:     Rate and Rhythm: Normal rate and regular rhythm.     Pulses: Normal pulses.     Heart sounds: Normal heart sounds.  Pulmonary:     Effort: Pulmonary effort is normal. No respiratory distress.     Breath sounds: Rhonchi present.  Abdominal:     General: Bowel sounds are  normal. There is no distension.     Palpations: Abdomen is soft.  Tenderness: There is no abdominal tenderness.  Musculoskeletal:     Cervical back: Neck supple.     Right lower leg: No edema.     Left lower leg: No edema.     Comments: Right hemiplegia   Lymphadenopathy:     Cervical: No cervical adenopathy.  Skin:    General: Skin is warm and dry.  Neurological:     Mental Status: He is alert. Mental status is at baseline.  Psychiatric:        Mood and Affect: Mood normal.      ASSESSMENT/ PLAN:  TODAY:  1. 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is stable will continue senna twice daily s needed  2. Deep vein thrombosis (DVT) of left extremity unspecified vein/other pulmonary embolism without acute cor pulmonale is stable is on long term eliquis 5 mg twice daily   3. 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 Simple bronchitis is stable 02 dependent will continue symbicort 80/4.5 mcg 2 puffs twice daily claritin 10 mg daily incruse elipta 62.5 mcg 1 puff daily   5. Dysphagia due to old cerebrovascular accident on honey thick liquids: stable chronically aspirates; honey thick liquids no signs of aspiration present.   6. Hypertension associated with type 2 diabetes mellitus is stable b/p 142/88 will continue labetalol 200 mg daily lisinopril 2.5 mg daily   7. 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 Hyperlipidemia associated with type 2 diabetes mellitus: is stable LDL 68 will continue lipitor 10 mg daily   9. Hyperthyroidism: is stable tsh 2.340 free t4: 0.66; will continue tapazole 2.5 mg daily           MD is aware of resident's narcotic use and is in agreement with  current plan of care. We will attempt to wean resident as appropriate.  Synthia Innocent NP [facility]  Contact [phone] Monday through Friday 8am- 5pm  After hours call [phone]

## 2020-01-29 ENCOUNTER — Encounter: Payer: Self-pay | Admitting: Adult Health

## 2020-01-29 ENCOUNTER — Non-Acute Institutional Stay (SKILLED_NURSING_FACILITY): Payer: Medicare Other | Admitting: Adult Health

## 2020-01-29 DIAGNOSIS — I679 Cerebrovascular disease, unspecified: Secondary | ICD-10-CM

## 2020-01-29 DIAGNOSIS — I2782 Chronic pulmonary embolism: Secondary | ICD-10-CM | POA: Diagnosis not present

## 2020-01-29 DIAGNOSIS — E0590 Thyrotoxicosis, unspecified without thyrotoxic crisis or storm: Secondary | ICD-10-CM

## 2020-01-29 DIAGNOSIS — I10 Essential (primary) hypertension: Secondary | ICD-10-CM

## 2020-01-29 DIAGNOSIS — E1149 Type 2 diabetes mellitus with other diabetic neurological complication: Secondary | ICD-10-CM

## 2020-01-29 DIAGNOSIS — J410 Simple chronic bronchitis: Secondary | ICD-10-CM | POA: Diagnosis not present

## 2020-01-29 DIAGNOSIS — E1159 Type 2 diabetes mellitus with other circulatory complications: Secondary | ICD-10-CM | POA: Diagnosis not present

## 2020-01-29 DIAGNOSIS — G8191 Hemiplegia, unspecified affecting right dominant side: Secondary | ICD-10-CM

## 2020-01-29 DIAGNOSIS — K219 Gastro-esophageal reflux disease without esophagitis: Secondary | ICD-10-CM | POA: Diagnosis not present

## 2020-01-29 DIAGNOSIS — Z7901 Long term (current) use of anticoagulants: Secondary | ICD-10-CM | POA: Diagnosis not present

## 2020-01-29 DIAGNOSIS — E1169 Type 2 diabetes mellitus with other specified complication: Secondary | ICD-10-CM | POA: Diagnosis not present

## 2020-01-29 DIAGNOSIS — E785 Hyperlipidemia, unspecified: Secondary | ICD-10-CM

## 2020-01-29 DIAGNOSIS — Z794 Long term (current) use of insulin: Secondary | ICD-10-CM

## 2020-01-29 DIAGNOSIS — IMO0002 Reserved for concepts with insufficient information to code with codable children: Secondary | ICD-10-CM

## 2020-01-29 DIAGNOSIS — I639 Cerebral infarction, unspecified: Secondary | ICD-10-CM | POA: Diagnosis not present

## 2020-01-29 DIAGNOSIS — I69391 Dysphagia following cerebral infarction: Secondary | ICD-10-CM | POA: Diagnosis not present

## 2020-01-29 DIAGNOSIS — K562 Volvulus: Secondary | ICD-10-CM | POA: Diagnosis not present

## 2020-01-29 DIAGNOSIS — E1165 Type 2 diabetes mellitus with hyperglycemia: Secondary | ICD-10-CM

## 2020-01-29 DIAGNOSIS — I152 Hypertension secondary to endocrine disorders: Secondary | ICD-10-CM

## 2020-01-29 NOTE — Progress Notes (Signed)
Provider: Carleene Overlie NP  Location   [facility]  PCP: Sharee Holster, NP   Extended Emergency Contact Information Primary Emergency Contact: [name] Address: [address]          [place], [place] [address] [place] of [place] Home Phone: [phone] Mobile Phone: [phone] Relation: Brother  Codes status: DNR Goals of care: advanced directive information Advanced Directives 01/29/2020  Does Patient Have a Medical Advance Directive? Yes  Type of Advance Directive Out of facility DNR (pink MOST or yellow form)  Does patient want to make changes to medical advance directive? No - Patient declined  Copy of Healthcare Power of Attorney in Chart? -  Would patient like information on creating a medical advance directive? -     Allergies  Allergen Reactions  . Penicillin G   . Penicillins     Has patient had a PCN reaction causing immediate rash, facial/tongue/throat swelling, SOB or lightheadedness with hypotension: unknown Has patient had a PCN reaction causing severe rash involving mucus membranes or skin necrosis: unknown Has patient had a PCN reaction that required hospitalization: unknown Has patient had a PCN reaction occurring within the last 10 years: unknown If all of the above answers are "NO", then may proceed with Cephalosporin use. 5/3 Tolerated Cefepime x 1 dose in ED.     Chief Complaint  Patient presents with  . Annual Exam    Annual Exam    HPI  He is a 74 year old long term resident of this facility being seen for his annual exam. He has been hospitalized in Dec 2020 for pneumonia and sepsis. He had a peg tube placed at that time. He chronically aspirates. He wanted the peg tube removed. This has been done. His weight is stable. There are no reports of uncontrolled pain. No reports of agitation or anxiety. No reports of active aspiration. He continues to be followed for his chronic illnesses including: diabetes; hypertension;  hyperthyroidism; hyperlipidemia.    Past Medical History:  Diagnosis Date  . Dysphasia   . Fatty liver   . Hemiplegia (HCC)   . Hypertension   . Pneumonia   . Pulmonary embolism (HCC)   . 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(HCC)   . Stroke [facility])    Past Surgical History:  Procedure Laterality Date  . ESOPHAGOGASTRODUODENOSCOPY (EGD) WITH PROPOFOL N/A 05/15/2019   Procedure: ESOPHAGOGASTRODUODENOSCOPY (EGD) WITH PROPOFOL;  Surgeon: Lucretia Roers, MD;  Location: [facility];  Service: General;  Laterality: N/A;  . FLEXIBLE SIGMOIDOSCOPY N/A 10/21/2016   Procedure: FLEXIBLE SIGMOIDOSCOPY;  Surgeon: Malissa Hippo, MD;  Location: [facility];  Service: Endoscopy;  Laterality: N/A;  . FLEXIBLE SIGMOIDOSCOPY N/A 10/25/2016   Procedure: FLEXIBLE SIGMOIDOSCOPY;  Surgeon: Malissa Hippo, MD;  Location: [facility];  Service: Endoscopy;  Laterality: N/A;  . PEG PLACEMENT N/A 05/15/2019   Procedure: PERCUTANEOUS ENDOSCOPIC GASTROSTOMY (PEG) PLACEMENT;  Surgeon: Lucretia Roers, MD;  Location: [facility];  Service: General;  Laterality: N/A;  . unable      reports that he has never smoked. He has never used smokeless tobacco. He reports that he does not drink alcohol and does not use drugs. Social History   Tobacco Use  . Smoking status: Never Smoker  . Smokeless tobacco: Never Used  Vaping Use  . Vaping Use: Never used  Substance Use Topics  . Alcohol use: No    Alcohol/week: 0.0 standard drinks  . Drug use: No   No family  history on file.  Pertinent  Health Maintenance Due  Topic Date Due  . INFLUENZA VACCINE  01/20/2020  . COLONOSCOPY  10/30/2025 (Originally 11/08/1995)  . HEMOGLOBIN A1C  04/11/2020  . FOOT EXAM  04/12/2020  . OPHTHALMOLOGY EXAM  04/29/2020  . URINE MICROALBUMIN  07/03/2020  . PNA vac Low Risk Adult  Completed   Fall Risk  09/14/2019 02/05/2019 03/27/2018 04/05/2017 03/17/2017  Falls in the past year? 0 0 No No No  Risk for fall due to : - Impaired balance/gait;Impaired  mobility - Impaired balance/gait;Impaired mobility;Medication side effect;Impaired vision;History of fall(s) -   [facility] screen [facility] 09/14/2019 02/05/2019 03/27/2018 04/05/2017 03/17/2017  Decreased Interest 0 0 0 0 0  Down, Depressed, Hopeless 0 0 0 0 0  PHQ - 2 Score 0 0 0 0 0  Some recent data might be hidden    Functional Status Survey:    Outpatient Encounter Medications as of 01/29/2020  Medication Sig  . acetaminophen (TYLENOL) 325 MG tablet Take 650 mg by mouth every 6 (six) hours as needed for moderate pain.   Marland Kitchen. apixaban (ELIQUIS) 5 MG TABS tablet Place 5 mg into feeding tube 2 (two) times daily. For dvt/pe/cva po  . atorvastatin (LIPITOR) 10 MG tablet Take 10 mg by mouth daily.  Marland Kitchen. atropine 1 % ophthalmic solution Place 1 drop under the tongue every 6 (six) hours as needed.  . bisacodyl (DULCOLAX) 10 MG suppository Place 1 suppository (10 mg total) rectally at bedtime.  . carboxymethylcellulose (REFRESH PLUS) 0.5 % SOLN Place 1 drop into the right eye in the morning, at noon, in the evening, and at bedtime.   Marland Kitchen. erythromycin ophthalmic ointment Place 1 application into the right eye 4 (four) times daily. Also apply to right eye - dx corneal scar - continue until next ophthalmology visit At Bedtime  . insulin aspart (NOVOLOG FLEXPEN) 100 UNIT/ML FlexPen Inject 10 Units into the skin 3 (three) times daily with meals. 10 units if above 200; subcutaneous  Special Instructions: with meals if cbg above 200  . Insulin Glargine (BASAGLAR KWIKPEN) 100 UNIT/ML SOPN Inject 25 Units into the skin at bedtime.   Marland Kitchen. ipratropium-albuterol (DUONEB) 0.5-2.5 (3) MG/3ML SOLN Take 3 mLs by nebulization every 6 (six) hours.   Marland Kitchen. labetalol (NORMODYNE) 200 MG tablet Take 200 mg by mouth daily. For HTN  . lactulose (CHRONULAC) 10 GM/15ML solution Take by mouth 2 (two) times daily. 30 cc  . lisinopril (ZESTRIL) 2.5 MG tablet Take 2.5 mg by mouth daily. hold for systolic < 110  . loratadine (CLARITIN) 10 MG  tablet Take 10 mg by mouth daily.  . methimazole (TAPAZOLE) 5 MG tablet Take 2.5 mg by mouth daily.  . NON FORMULARY Diet- Liquids: Nectar Thick Liquids  Diet:Dysphagia 1 Puree  . OXYGEN Inhale 2 L into the lungs continuous. Every shift to maintain satuation >90%.  Marland Kitchen. sennosides (SENOKOT) 8.8 MG/5ML syrup Take 5 mLs by mouth 2 (two) times daily as needed for mild constipation.  . Vitamins A & D (VITAMIN A & D) ointment Apply 1 application topically as needed for dry skin. To left heel qshift for prevention.   No facility-administered encounter medications on file as of 01/29/2020.     Vitals:   01/29/20 0948  BP: 129/87  Pulse: 87  Resp: 20  Temp: (!) 97.4 F (36.3 C)  TempSrc: Oral  SpO2: 95%  Weight: 246 lb 6.4 oz (111.8 kg)  Height: 5' 8.5" (1.74 m)   Body mass  index is 36.92 kg/m.  DIAGNOSTIC EXAMS   PREVIOUS;   01-16-19: chest x-ray: Infiltrate versus atelectasis at LEFT base   05-05-19: chest x-ray: 1. Normal chest x-ray.  2. No tuberculosis is noted   05-08-19: KUB: Findings suggest constipation, fecal impaction or decreased colonic motility. Correlate with risk factors for constipation or fecal stasis, based on frequency or absence of bowel movements. Overall sensitivity is decreased, due to attenuation from patient body habitus.  Soft tissue attenuation results in significantly diminished diagnostic sensitivity and utility.  05-09-19: chest x-ray: Mild bibasilar subsegmental atelectasis or infiltrates are noted   05-09-19: ct of abdomen and pelvis:  1. Moderately large amount of stool in the distal sigmoid colon and rectum. Moderate gaseous distension of the more proximal sigmoid colon without evidence of volvulus or bowel obstruction. 2. Bibasilar lung consolidation concerning for pneumonia. 3. Aortic Atherosclerosis   07-12-18: chest x-ray: Cardiomegaly with low lung volumes and bibasilar collapse/consolidation, left greater than right.  05-16-19: chest  x-ray: ETT in good position. Worsened bilateral airspace disease small pleural effusions.  05-19-19: chest x-ray: Partial but incomplete clearing of opacity from the basis. Patchy airspace opacity remains in the lower lung regions with consolidation in a portion of the left base. There are small pleural effusions bilaterally. Upper lung regions appear clear. Heart upper normal in size. No adenopathy demonstrable by radiography.  NO NEW EXAMS     LABS REVIEWED: PREVIOUS   01-23-19: INR 1.4  02-05-19: wbc 9.9; hgb 15.3; hct 482; mcv 96.4; plt 236; glucose 99; bun 46; creat 1.54; k+ 4.0; na++ 139; ca 8.7; liver normal albumin 3.2; chol 106; ldl 62; trig 54; hdl 33; PSA 10.58 (scheduled for 03-31-19)  03-16-19: tsh 2.668 free T4: 0.81 05-01-19: PSA 11.82 05-05-19: wbc 12.3; hgb 16.7; hct 53.4; mcv 98.2 plt 246; blood culture: no growth 05-08-19: PSA 16.39 05-09-19: wbc 13.3; hgb 16.9; hct 56.5; mcv 102.7 plt 261; glucose 689; bun 82; creat 2.78; k+ 5.3; na++ 151; ca 8.9; urine culture: enterococcus faecalis:  Hgb a1c 11.3 05-11-19: glucose 98; bun 36; creat 1.61; k+ 3.8; na++ 158; ca 7.8; phos 2.0 albumin 2.6 05-15-19: wbc 8.2; hgb 12.9; hct 42.2; mcv 100.2 plt 165; glucose 178; bun 15; creat 1.37; k+ 3.5; na++ 143; ca 7.5  05-18-19: wbc 7.8; hgb 13.3; hct 43.2; mcv 99.1 plt 227; glucose 246; bun 28; creat 1.21; k+ 3.2; na++ 139 ca 7.7 05-20-19: wbc 9.5; hgb 13.3; hct 44.0; mcv 98.9 plt 264; glucose 190; bun 25; creat 1.10; k+ 3.7; na++ 138; ca 8.3 ;liver normal albumin 2.4  07-04-19 glucose 99; bun 31; creat 1.19 ;k+ 3.8; na++ 143; ca 8.8 chol 109; ldl 68 trig 57; hdl 30; urine micro-albumin 33.8 07-05-19: tsh 1.257 free T4: 0.66  10-11-19: hgb a1c 7.2   11-05-19: tsh 2.340  NO NEW LABS.   Review of Systems  Constitutional: Negative for malaise/fatigue.  Respiratory: Negative for cough and shortness of breath.   Cardiovascular: Negative for chest pain and leg swelling.  Gastrointestinal:  Negative for abdominal pain.  Musculoskeletal: Negative for back pain, joint pain and myalgias.  Skin: Negative.   Neurological: Negative for dizziness.  Psychiatric/Behavioral: The patient is not nervous/anxious.     Physical Exam Constitutional:      General: He is not in acute distress.    Appearance: He is well-developed. He is obese. He is not diaphoretic.  HENT:     Nose: Nose normal.     Mouth/Throat:     Mouth:  Mucous membranes are moist.     Pharynx: Oropharynx is clear.  Eyes:     Conjunctiva/sclera: Conjunctivae normal.  Neck:     Thyroid: No thyromegaly.  Cardiovascular:     Rate and Rhythm: Normal rate and regular rhythm.     Pulses: Normal pulses.     Heart sounds: Normal heart sounds.  Pulmonary:     Effort: Pulmonary effort is normal. No respiratory distress.     Breath sounds: Rhonchi present.  Abdominal:     General: Bowel sounds are normal. There is no distension.     Palpations: Abdomen is soft.     Tenderness: There is no abdominal tenderness.  Musculoskeletal:     Cervical back: Neck supple.     Right lower leg: No edema.     Left lower leg: No edema.     Comments: Right hemiplegia   Lymphadenopathy:     Cervical: No cervical adenopathy.  Skin:    General: Skin is warm and dry.  Neurological:     Mental Status: He is alert. Mental status is at baseline.  Psychiatric:        Mood and Affect: Mood normal.      ASSESSMENT/ PLAN:  TODAY:  1. 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 No signs of aspiration present.   3. Hypertension associated with type 2 diabetes mellitis: is stable b/p 129/87 will continue labetolol 200 mg daily and lisinopril 2.5 mg daily   4. 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 Hyperlipidemia associated with type 2 diabetes mellitus is stable LDL 68 will continue lipitor 10 mg daily   6. Hyperthyroidism: is stable tsh 2.340 free t4: 0.66 will continue tapazole 2.5 mg daily   7. 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 Deep vein thrombosis (DVT) of left lower extremity unspecified vein/other pulmonary embolism without acuate cor pulmonale is stable is on long term eliquis 5 mg twice daily   9. 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      MD is aware of resident's narcotic use and is in agreement with current plan of care. We will attempt to wean resident as appropriate.  Synthia Innocent NP [facility]  Contact [phone] Monday through Friday 8am- 5pm  After hours call [phone]

## 2020-01-30 ENCOUNTER — Encounter: Payer: Self-pay | Admitting: Adult Health

## 2020-01-31 ENCOUNTER — Other Ambulatory Visit (HOSPITAL_COMMUNITY)
Admission: RE | Admit: 2020-01-31 | Discharge: 2020-01-31 | Disposition: A | Discharge status: Home / Self Care | Payer: Medicare Other | Source: Skilled Nursing Facility | Attending: Adult Health | Admitting: Adult Health

## 2020-01-31 DIAGNOSIS — E1165 Type 2 diabetes mellitus with hyperglycemia: Secondary | ICD-10-CM | POA: Insufficient documentation

## 2020-01-31 LAB — COMPREHENSIVE METABOLIC PANEL
ALT: 13 U/L (ref 0–44)
AST: 13 U/L — ABNORMAL LOW (ref 15–41)
Albumin: 3.1 g/dL — ABNORMAL LOW (ref 3.5–5.0)
Alkaline Phosphatase: 54 U/L (ref 38–126)
Anion gap: 11 (ref 5–15)
BUN: 24 mg/dL — ABNORMAL HIGH (ref 8–23)
CO2: 34 mmol/L — ABNORMAL HIGH (ref 22–32)
Calcium: 8.8 mg/dL — ABNORMAL LOW (ref 8.9–10.3)
Chloride: 96 mmol/L — ABNORMAL LOW (ref 98–111)
Creatinine, Ser: 1.1 mg/dL (ref 0.61–1.24)
GFR calc Af Amer: >60 mL/min (ref >60)
GFR calc non Af Amer: >60 mL/min (ref >60)
Glucose, Bld: 122 mg/dL — ABNORMAL HIGH (ref 70–99)
Potassium: 4 mmol/L (ref 3.5–5.1)
Sodium: 141 mmol/L (ref 135–145)
Total Bilirubin: 0.4 mg/dL (ref 0.3–1.2)
Total Protein: 7.3 g/dL (ref 6.5–8.1)

## 2020-01-31 LAB — CBC
HCT: 43.7 % (ref 39.0–52.0)
Hemoglobin: 13.2 g/dL (ref 13.0–17.0)
MCH: 30.5 pg (ref 26.0–34.0)
MCHC: 30.2 g/dL (ref 30.0–36.0)
MCV: 100.9 fL — ABNORMAL HIGH (ref 80.0–100.0)
Platelets: 271 10*3/uL (ref 150–400)
RBC: 4.33 MIL/uL (ref 4.22–5.81)
RDW: 13.3 % (ref 11.5–15.5)
WBC: 8.3 10*3/uL (ref 4.0–10.5)
nRBC: 0 % (ref 0.0–0.2)

## 2020-01-31 LAB — HEMOGLOBIN A1C
Hgb A1c MFr Bld: 7.9 % — ABNORMAL HIGH (ref 4.8–5.6)
Mean Plasma Glucose: 180.03 mg/dL

## 2020-01-31 LAB — LIPID PANEL
Cholesterol: 138 mg/dL (ref 0–200)
HDL: 34 mg/dL — ABNORMAL LOW (ref >40)
LDL Cholesterol: 93 mg/dL (ref 0–99)
Total CHOL/HDL Ratio: 4.1 RATIO
Triglycerides: 57 mg/dL (ref <150)
VLDL: 11 mg/dL (ref 0–40)

## 2020-02-07 DIAGNOSIS — Z1159 Encounter for screening for other viral diseases: Secondary | ICD-10-CM | POA: Diagnosis not present

## 2020-02-07 DIAGNOSIS — Z431 Encounter for attention to gastrostomy: Secondary | ICD-10-CM | POA: Diagnosis not present

## 2020-02-11 DIAGNOSIS — Z1159 Encounter for screening for other viral diseases: Secondary | ICD-10-CM | POA: Diagnosis not present

## 2020-02-11 DIAGNOSIS — Z431 Encounter for attention to gastrostomy: Secondary | ICD-10-CM | POA: Diagnosis not present

## 2020-02-13 DIAGNOSIS — M6281 Muscle weakness (generalized): Secondary | ICD-10-CM | POA: Diagnosis not present

## 2020-02-13 DIAGNOSIS — I10 Essential (primary) hypertension: Secondary | ICD-10-CM | POA: Diagnosis not present

## 2020-02-13 DIAGNOSIS — R279 Unspecified lack of coordination: Secondary | ICD-10-CM | POA: Diagnosis not present

## 2020-02-13 DIAGNOSIS — G8191 Hemiplegia, unspecified affecting right dominant side: Secondary | ICD-10-CM | POA: Diagnosis not present

## 2020-02-14 DIAGNOSIS — I10 Essential (primary) hypertension: Secondary | ICD-10-CM | POA: Diagnosis not present

## 2020-02-14 DIAGNOSIS — M6281 Muscle weakness (generalized): Secondary | ICD-10-CM | POA: Diagnosis not present

## 2020-02-14 DIAGNOSIS — G8191 Hemiplegia, unspecified affecting right dominant side: Secondary | ICD-10-CM | POA: Diagnosis not present

## 2020-02-14 DIAGNOSIS — R279 Unspecified lack of coordination: Secondary | ICD-10-CM | POA: Diagnosis not present

## 2020-02-15 DIAGNOSIS — M6281 Muscle weakness (generalized): Secondary | ICD-10-CM | POA: Diagnosis not present

## 2020-02-15 DIAGNOSIS — M2041 Other hammer toe(s) (acquired), right foot: Secondary | ICD-10-CM | POA: Diagnosis not present

## 2020-02-15 DIAGNOSIS — B351 Tinea unguium: Secondary | ICD-10-CM | POA: Diagnosis not present

## 2020-02-15 DIAGNOSIS — I739 Peripheral vascular disease, unspecified: Secondary | ICD-10-CM | POA: Diagnosis not present

## 2020-02-15 DIAGNOSIS — I10 Essential (primary) hypertension: Secondary | ICD-10-CM | POA: Diagnosis not present

## 2020-02-15 DIAGNOSIS — G8191 Hemiplegia, unspecified affecting right dominant side: Secondary | ICD-10-CM | POA: Diagnosis not present

## 2020-02-15 DIAGNOSIS — M2042 Other hammer toe(s) (acquired), left foot: Secondary | ICD-10-CM | POA: Diagnosis not present

## 2020-02-15 DIAGNOSIS — R279 Unspecified lack of coordination: Secondary | ICD-10-CM | POA: Diagnosis not present

## 2020-02-16 DIAGNOSIS — R279 Unspecified lack of coordination: Secondary | ICD-10-CM | POA: Diagnosis not present

## 2020-02-16 DIAGNOSIS — M6281 Muscle weakness (generalized): Secondary | ICD-10-CM | POA: Diagnosis not present

## 2020-02-16 DIAGNOSIS — I10 Essential (primary) hypertension: Secondary | ICD-10-CM | POA: Diagnosis not present

## 2020-02-16 DIAGNOSIS — G8191 Hemiplegia, unspecified affecting right dominant side: Secondary | ICD-10-CM | POA: Diagnosis not present

## 2020-02-18 DIAGNOSIS — R279 Unspecified lack of coordination: Secondary | ICD-10-CM | POA: Diagnosis not present

## 2020-02-18 DIAGNOSIS — M6281 Muscle weakness (generalized): Secondary | ICD-10-CM | POA: Diagnosis not present

## 2020-02-18 DIAGNOSIS — I10 Essential (primary) hypertension: Secondary | ICD-10-CM | POA: Diagnosis not present

## 2020-02-18 DIAGNOSIS — G8191 Hemiplegia, unspecified affecting right dominant side: Secondary | ICD-10-CM | POA: Diagnosis not present

## 2020-02-19 DIAGNOSIS — R279 Unspecified lack of coordination: Secondary | ICD-10-CM | POA: Diagnosis not present

## 2020-02-19 DIAGNOSIS — G8191 Hemiplegia, unspecified affecting right dominant side: Secondary | ICD-10-CM | POA: Diagnosis not present

## 2020-02-19 DIAGNOSIS — Z515 Encounter for palliative care: Secondary | ICD-10-CM | POA: Diagnosis not present

## 2020-02-19 DIAGNOSIS — I69391 Dysphagia following cerebral infarction: Secondary | ICD-10-CM | POA: Diagnosis not present

## 2020-02-19 DIAGNOSIS — M6281 Muscle weakness (generalized): Secondary | ICD-10-CM | POA: Diagnosis not present

## 2020-02-19 DIAGNOSIS — I10 Essential (primary) hypertension: Secondary | ICD-10-CM | POA: Diagnosis not present

## 2020-02-19 DIAGNOSIS — I639 Cerebral infarction, unspecified: Secondary | ICD-10-CM | POA: Diagnosis not present

## 2020-02-20 DIAGNOSIS — G8191 Hemiplegia, unspecified affecting right dominant side: Secondary | ICD-10-CM | POA: Diagnosis not present

## 2020-02-20 DIAGNOSIS — M6281 Muscle weakness (generalized): Secondary | ICD-10-CM | POA: Diagnosis not present

## 2020-02-20 DIAGNOSIS — I10 Essential (primary) hypertension: Secondary | ICD-10-CM | POA: Diagnosis not present

## 2020-02-20 DIAGNOSIS — R279 Unspecified lack of coordination: Secondary | ICD-10-CM | POA: Diagnosis not present

## 2020-02-21 DIAGNOSIS — G8191 Hemiplegia, unspecified affecting right dominant side: Secondary | ICD-10-CM | POA: Diagnosis not present

## 2020-02-21 DIAGNOSIS — R279 Unspecified lack of coordination: Secondary | ICD-10-CM | POA: Diagnosis not present

## 2020-02-21 DIAGNOSIS — I10 Essential (primary) hypertension: Secondary | ICD-10-CM | POA: Diagnosis not present

## 2020-02-21 DIAGNOSIS — M6281 Muscle weakness (generalized): Secondary | ICD-10-CM | POA: Diagnosis not present

## 2020-02-22 DIAGNOSIS — G8191 Hemiplegia, unspecified affecting right dominant side: Secondary | ICD-10-CM | POA: Diagnosis not present

## 2020-02-22 DIAGNOSIS — R279 Unspecified lack of coordination: Secondary | ICD-10-CM | POA: Diagnosis not present

## 2020-02-22 DIAGNOSIS — I10 Essential (primary) hypertension: Secondary | ICD-10-CM | POA: Diagnosis not present

## 2020-02-22 DIAGNOSIS — M6281 Muscle weakness (generalized): Secondary | ICD-10-CM | POA: Diagnosis not present

## 2020-02-25 DIAGNOSIS — G8191 Hemiplegia, unspecified affecting right dominant side: Secondary | ICD-10-CM | POA: Diagnosis not present

## 2020-02-25 DIAGNOSIS — M6281 Muscle weakness (generalized): Secondary | ICD-10-CM | POA: Diagnosis not present

## 2020-02-25 DIAGNOSIS — I10 Essential (primary) hypertension: Secondary | ICD-10-CM | POA: Diagnosis not present

## 2020-02-25 DIAGNOSIS — R279 Unspecified lack of coordination: Secondary | ICD-10-CM | POA: Diagnosis not present

## 2020-02-26 DIAGNOSIS — I10 Essential (primary) hypertension: Secondary | ICD-10-CM | POA: Diagnosis not present

## 2020-02-26 DIAGNOSIS — R279 Unspecified lack of coordination: Secondary | ICD-10-CM | POA: Diagnosis not present

## 2020-02-26 DIAGNOSIS — G8191 Hemiplegia, unspecified affecting right dominant side: Secondary | ICD-10-CM | POA: Diagnosis not present

## 2020-02-26 DIAGNOSIS — M6281 Muscle weakness (generalized): Secondary | ICD-10-CM | POA: Diagnosis not present

## 2020-02-27 DIAGNOSIS — M6281 Muscle weakness (generalized): Secondary | ICD-10-CM | POA: Diagnosis not present

## 2020-02-27 DIAGNOSIS — R279 Unspecified lack of coordination: Secondary | ICD-10-CM | POA: Diagnosis not present

## 2020-02-27 DIAGNOSIS — I10 Essential (primary) hypertension: Secondary | ICD-10-CM | POA: Diagnosis not present

## 2020-02-27 DIAGNOSIS — G8191 Hemiplegia, unspecified affecting right dominant side: Secondary | ICD-10-CM | POA: Diagnosis not present

## 2020-02-28 ENCOUNTER — Encounter: Payer: Self-pay | Admitting: Adult Health

## 2020-02-28 ENCOUNTER — Non-Acute Institutional Stay (SKILLED_NURSING_FACILITY): Payer: Medicare Other | Admitting: Adult Health

## 2020-02-28 DIAGNOSIS — I2782 Chronic pulmonary embolism: Secondary | ICD-10-CM | POA: Diagnosis not present

## 2020-02-28 DIAGNOSIS — Z794 Long term (current) use of insulin: Secondary | ICD-10-CM | POA: Diagnosis not present

## 2020-02-28 DIAGNOSIS — E1165 Type 2 diabetes mellitus with hyperglycemia: Secondary | ICD-10-CM

## 2020-02-28 DIAGNOSIS — I639 Cerebral infarction, unspecified: Secondary | ICD-10-CM

## 2020-02-28 DIAGNOSIS — E1149 Type 2 diabetes mellitus with other diabetic neurological complication: Secondary | ICD-10-CM | POA: Diagnosis not present

## 2020-02-28 DIAGNOSIS — I10 Essential (primary) hypertension: Secondary | ICD-10-CM | POA: Diagnosis not present

## 2020-02-28 DIAGNOSIS — G8191 Hemiplegia, unspecified affecting right dominant side: Secondary | ICD-10-CM | POA: Diagnosis not present

## 2020-02-28 DIAGNOSIS — M6281 Muscle weakness (generalized): Secondary | ICD-10-CM | POA: Diagnosis not present

## 2020-02-28 DIAGNOSIS — IMO0002 Reserved for concepts with insufficient information to code with codable children: Secondary | ICD-10-CM

## 2020-02-28 DIAGNOSIS — R279 Unspecified lack of coordination: Secondary | ICD-10-CM | POA: Diagnosis not present

## 2020-02-28 NOTE — Progress Notes (Signed)
Location:    [facility] Room Number: 112/W Place of Service:  SNF (31)   CODE STATUS: DNR  Allergies  Allergen Reactions  . Penicillin G   . Penicillins     Has patient had a PCN reaction causing immediate rash, facial/tongue/throat swelling, SOB or lightheadedness with hypotension: unknown Has patient had a PCN reaction causing severe rash involving mucus membranes or skin necrosis: unknown Has patient had a PCN reaction that required hospitalization: unknown Has patient had a PCN reaction occurring within the last 10 years: unknown If all of the above answers are "NO", then may proceed with Cephalosporin use. 5/3 Tolerated Cefepime x 1 dose in ED.     Chief Complaint  Patient presents with  . Acute Visit    Care Plan Meeting     HPI:  We have come together for his care plan meeting. Family present. BIMS 8/15 mood 0/30. There have been no falls. No signs of aspiration present. His weight is at 242 pounds. He is total care with his adls; is incontinent of bladder and bowel. He is awaiting right hand splint. His cbg readings are elevated in the evening hours his AM readings are under control. 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 DVT (deep venous thrombosis) (HCC) 09/26/2012  . Dysphasia   . Fatty liver   . Hemiplegia (HCC)   . Hypertension   . Pneumonia   . Pulmonary embolism (HCC)   . 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(HCC)   . Stroke [facility](HCC)     Past Surgical History:  Procedure Laterality Date  . ESOPHAGOGASTRODUODENOSCOPY (EGD) WITH PROPOFOL N/A 05/15/2019   Procedure: ESOPHAGOGASTRODUODENOSCOPY (EGD) WITH PROPOFOL;  Surgeon: Lucretia RoersBridges, Lindsay C, MD;  Location: [facility] ORS;  Service:  General;  Laterality: N/A;  . FLEXIBLE SIGMOIDOSCOPY N/A 10/21/2016   Procedure: FLEXIBLE SIGMOIDOSCOPY;  Surgeon: Malissa Hippoehman, Najeeb U, MD;  Location: [facility] ENDO SUITE;  Service: Endoscopy;  Laterality: N/A;  . FLEXIBLE SIGMOIDOSCOPY N/A 10/25/2016   Procedure: FLEXIBLE SIGMOIDOSCOPY;  Surgeon: Malissa Hippoehman, Najeeb U, MD;  Location: [facility] ENDO SUITE;  Service: Endoscopy;  Laterality: N/A;  . PEG PLACEMENT N/A 05/15/2019   Procedure: PERCUTANEOUS ENDOSCOPIC GASTROSTOMY (PEG) PLACEMENT;  Surgeon: Lucretia RoersBridges, Lindsay C, MD;  Location: [facility] ORS;  Service: General;  Laterality: N/A;  . unable      Social History   Socioeconomic History  . Marital status: Single    Spouse name: Not on file  . Number of children: Not on file  . Years of education: Not on file  . Highest education level: Not on file  Occupational History  . Not on file  Tobacco Use  . Smoking status: Never Smoker  . Smokeless tobacco: Never Used  Vaping Use  . Vaping Use: Never used  Substance and Sexual Activity  . Alcohol use: No    Alcohol/week: 0.0 standard drinks  . Drug use: No  . Sexual activity: Not on file  Other Topics Concern  . Not on file  Social History Narrative  . Not on file   Social Determinants of Health   Financial Resource Strain:   . Difficulty of Paying Living Expenses: Not on file  Food Insecurity:   . Worried About Programme researcher, broadcasting/film/videounning Out of Food in the Last Year: Not on  file  . Ran Out of Food in the Last Year: Not on file  Transportation Needs:   . Lack of Transportation (Medical): Not on file  . Lack of Transportation (Non-Medical): Not on file  Physical Activity:   . Days of Exercise per Week: Not on file  . Minutes of Exercise per Session: Not on file  Stress:   . Feeling of Stress : Not on file  Social Connections:   . Frequency of Communication with Friends and Family: Not on file  . Frequency of Social Gatherings with Friends and Family: Not on file  . Attends Religious Services: Not on file  . Active  Member of Clubs or Organizations: Not on file  . Attends Banker Meetings: Not on file  . Marital Status: Not on file  Intimate Partner Violence:   . Fear of Current or Ex-Partner: Not on file  . Emotionally Abused: Not on file  . Physically Abused: Not on file  . Sexually Abused: Not on file   No family history on file.    VITAL SIGNS BP (!) 143/82   Pulse 88   Temp 98.5 F (36.9 C) (Oral)   Resp (!) 22   Ht 5' 8.5" (1.74 m)   Wt 242 lb (109.8 kg)   SpO2 92%   BMI 36.26 kg/m   Outpatient Encounter Medications as of 02/28/2020  Medication Sig  . acetaminophen (TYLENOL) 325 MG tablet Take 650 mg by mouth every 6 (six) hours as needed for moderate pain.   Marland Kitchen apixaban (ELIQUIS) 5 MG TABS tablet Place 5 mg into feeding tube 2 (two) times daily. For dvt/pe/cva po  . atorvastatin (LIPITOR) 10 MG tablet Take 10 mg by mouth daily.  Marland Kitchen atropine 1 % ophthalmic solution Place 1 drop under the tongue every 6 (six) hours as needed.  . bisacodyl (DULCOLAX) 10 MG suppository Place 1 suppository (10 mg total) rectally at bedtime.  . carboxymethylcellulose (REFRESH PLUS) 0.5 % SOLN Place 1 drop into the right eye in the morning, at noon, in the evening, and at bedtime.   Marland Kitchen erythromycin ophthalmic ointment Place 1 application into the right eye 4 (four) times daily. Also apply to right eye - dx corneal scar - continue until next ophthalmology visit At Bedtime  . insulin aspart (NOVOLOG FLEXPEN) 100 UNIT/ML FlexPen Inject 10 Units into the skin 3 (three) times daily with meals. 10 units if above 200; subcutaneous  Special Instructions: with meals if cbg above 200  . Insulin Glargine (BASAGLAR KWIKPEN) 100 UNIT/ML SOPN Inject 25 Units into the skin at bedtime.   Marland Kitchen ipratropium-albuterol (DUONEB) 0.5-2.5 (3) MG/3ML SOLN Take 3 mLs by nebulization every 6 (six) hours.   Marland Kitchen labetalol (NORMODYNE) 200 MG tablet Take 200 mg by mouth daily. For HTN  . lactulose (CHRONULAC) 10 GM/15ML solution  Give 30 cc by mouth twice a day  . lisinopril (ZESTRIL) 2.5 MG tablet Take 2.5 mg by mouth daily. hold for systolic < 110  . loratadine (CLARITIN) 10 MG tablet Take 10 mg by mouth daily.  . methimazole (TAPAZOLE) 5 MG tablet Take 2.5 mg by mouth daily.  . NON FORMULARY Diet- Liquids: Nectar Thick Liquids  Diet:Dysphagia 1 Puree  . OXYGEN Inhale 2 L into the lungs continuous. Every shift to maintain satuation >90%.  Marland Kitchen sennosides (SENOKOT) 8.8 MG/5ML syrup Take 5 mLs by mouth 2 (two) times daily as needed for mild constipation.  . Vitamins A & D (VITAMIN A & D) ointment Apply  1 application topically as needed for dry skin. To left heel qshift for prevention.   No facility-administered encounter medications on file as of 02/28/2020.     SIGNIFICANT DIAGNOSTIC EXAMS   PREVIOUS;   01-16-19: chest x-ray: Infiltrate versus atelectasis at LEFT base   05-05-19: chest x-ray: 1. Normal chest x-ray.  2. No tuberculosis is noted   05-08-19: KUB: Findings suggest constipation, fecal impaction or decreased colonic motility. Correlate with risk factors for constipation or fecal stasis, based on frequency or absence of bowel movements. Overall sensitivity is decreased, due to attenuation from patient body habitus.  Soft tissue attenuation results in significantly diminished diagnostic sensitivity and utility.  05-09-19: chest x-ray: Mild bibasilar subsegmental atelectasis or infiltrates are noted   05-09-19: ct of abdomen and pelvis:  1. Moderately large amount of stool in the distal sigmoid colon and rectum. Moderate gaseous distension of the more proximal sigmoid colon without evidence of volvulus or bowel obstruction. 2. Bibasilar lung consolidation concerning for pneumonia. 3. Aortic Atherosclerosis   07-12-18: chest x-ray: Cardiomegaly with low lung volumes and bibasilar collapse/consolidation, left greater than right.  05-16-19: chest x-ray: ETT in good position. Worsened bilateral airspace  disease small pleural effusions.  05-19-19: chest x-ray: Partial but incomplete clearing of opacity from the basis. Patchy airspace opacity remains in the lower lung regions with consolidation in a portion of the left base. There are small pleural effusions bilaterally. Upper lung regions appear clear. Heart upper normal in size. No adenopathy demonstrable by radiography.  NO NEW EXAMS     LABS REVIEWED: PREVIOUS   03-16-19: tsh 2.668 free T4: 0.81 05-01-19: PSA 11.82 05-05-19: wbc 12.3; hgb 16.7; hct 53.4; mcv 98.2 plt 246; blood culture: no growth 05-08-19: PSA 16.39 05-09-19: wbc 13.3; hgb 16.9; hct 56.5; mcv 102.7 plt 261; glucose 689; bun 82; creat 2.78; k+ 5.3; na++ 151; ca 8.9; urine culture: enterococcus faecalis:  Hgb a1c 11.3 05-11-19: glucose 98; bun 36; creat 1.61; k+ 3.8; na++ 158; ca 7.8; phos 2.0 albumin 2.6 05-15-19: wbc 8.2; hgb 12.9; hct 42.2; mcv 100.2 plt 165; glucose 178; bun 15; creat 1.37; k+ 3.5; na++ 143; ca 7.5  05-18-19: wbc 7.8; hgb 13.3; hct 43.2; mcv 99.1 plt 227; glucose 246; bun 28; creat 1.21; k+ 3.2; na++ 139 ca 7.7 05-20-19: wbc 9.5; hgb 13.3; hct 44.0; mcv 98.9 plt 264; glucose 190; bun 25; creat 1.10; k+ 3.7; na++ 138; ca 8.3 ;liver normal albumin 2.4  07-04-19 glucose 99; bun 31; creat 1.19 ;k+ 3.8; na++ 143; ca 8.8 chol 109; ldl 68 trig 57; hdl 30; urine micro-albumin 33.8 07-05-19: tsh 1.257 free T4: 0.66  10-11-19: hgb a1c 7.2   11-05-19: tsh 2.340  TODAY  01-31-20: wbc 8.3; hgb 13.2; hct 43.7; mcv 100.9 plt 271; glucose 122; bun 24; creat 1.10 ;k+ 4.0; na++ 141; ca 8.8 liver norm albumin 3.1 hgb a1c 7.9; chol 138; ldl 93; trig 57; hdl 34    Review of Systems  Constitutional: Negative for malaise/fatigue.  Respiratory: Negative for cough and shortness of breath.   Cardiovascular: Negative for chest pain, palpitations and leg swelling.  Gastrointestinal: Negative for abdominal pain, constipation and heartburn.  Musculoskeletal: Negative for back  pain, joint pain and myalgias.  Skin: Negative.   Neurological: Negative for dizziness.  Psychiatric/Behavioral: The patient is not nervous/anxious.     Physical Exam Constitutional:      General: He is not in acute distress.    Appearance: He is well-developed. He is obese. He  is not diaphoretic.  Neck:     Thyroid: No thyromegaly.  Cardiovascular:     Rate and Rhythm: Normal rate and regular rhythm.     Pulses: Normal pulses.     Heart sounds: Normal heart sounds.  Pulmonary:     Effort: Pulmonary effort is normal. No respiratory distress.     Breath sounds: Normal breath sounds.  Abdominal:     General: Bowel sounds are normal. There is no distension.     Palpations: Abdomen is soft.     Tenderness: There is no abdominal tenderness.  Musculoskeletal:     Cervical back: Neck supple.     Right lower leg: No edema.     Left lower leg: No edema.     Comments: Right hemiplegia    Lymphadenopathy:     Cervical: No cervical adenopathy.  Skin:    General: Skin is warm and dry.  Neurological:     Mental Status: He is alert. Mental status is at baseline.  Psychiatric:        Mood and Affect: Mood normal.      ASSESSMENT/ PLAN:  TODAY  1.  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 cerebrovascular accident (CVA) unspecified mechanism 3. Other chronic pulmonary embolism without acute cor pulmonale  Will change novolog to 13 units with lunch and supper will stop breakfast novolog Will continue current plan of care Will continue to monitor his status.      MD is aware of resident's narcotic use and is in agreement with current plan of care. We will attempt to wean resident as appropriate.  Synthia Innocent NP [facility]  Contact [phone] Monday through Friday 8am- 5pm  After hours call [phone]

## 2020-02-29 DIAGNOSIS — G8191 Hemiplegia, unspecified affecting right dominant side: Secondary | ICD-10-CM | POA: Diagnosis not present

## 2020-02-29 DIAGNOSIS — M6281 Muscle weakness (generalized): Secondary | ICD-10-CM | POA: Diagnosis not present

## 2020-02-29 DIAGNOSIS — R279 Unspecified lack of coordination: Secondary | ICD-10-CM | POA: Diagnosis not present

## 2020-02-29 DIAGNOSIS — I10 Essential (primary) hypertension: Secondary | ICD-10-CM | POA: Diagnosis not present

## 2020-03-03 ENCOUNTER — Non-Acute Institutional Stay (SKILLED_NURSING_FACILITY): Payer: Medicare Other | Admitting: Adult Health

## 2020-03-03 ENCOUNTER — Encounter: Payer: Self-pay | Admitting: Adult Health

## 2020-03-03 DIAGNOSIS — G8191 Hemiplegia, unspecified affecting right dominant side: Secondary | ICD-10-CM | POA: Diagnosis not present

## 2020-03-03 DIAGNOSIS — E785 Hyperlipidemia, unspecified: Secondary | ICD-10-CM

## 2020-03-03 DIAGNOSIS — R279 Unspecified lack of coordination: Secondary | ICD-10-CM | POA: Diagnosis not present

## 2020-03-03 DIAGNOSIS — E1169 Type 2 diabetes mellitus with other specified complication: Secondary | ICD-10-CM | POA: Diagnosis not present

## 2020-03-03 DIAGNOSIS — M6281 Muscle weakness (generalized): Secondary | ICD-10-CM | POA: Diagnosis not present

## 2020-03-03 DIAGNOSIS — E0590 Thyrotoxicosis, unspecified without thyrotoxic crisis or storm: Secondary | ICD-10-CM

## 2020-03-03 DIAGNOSIS — I639 Cerebral infarction, unspecified: Secondary | ICD-10-CM | POA: Diagnosis not present

## 2020-03-03 DIAGNOSIS — I10 Essential (primary) hypertension: Secondary | ICD-10-CM | POA: Diagnosis not present

## 2020-03-03 NOTE — Progress Notes (Signed)
Location:    [facility] Room Number: 112/W Place of Service:  SNF (31)   CODE STATUS: DNR  Allergies  Allergen Reactions  . Penicillin G   . Penicillins     Has patient had a PCN reaction causing immediate rash, facial/tongue/throat swelling, SOB or lightheadedness with hypotension: unknown Has patient had a PCN reaction causing severe rash involving mucus membranes or skin necrosis: unknown Has patient had a PCN reaction that required hospitalization: unknown Has patient had a PCN reaction occurring within the last 10 years: unknown If all of the above answers are "NO", then may proceed with Cephalosporin use. 5/3 Tolerated Cefepime x 1 dose in ED.     Chief Complaint  Patient presents with  . Medical Management of Chronic Issues             Cerebrovascular accident (CVA) due to other mechanism/hemiplegia or right dominant side due to cerebrovascular disease:       Hyperlipidemia associated with type 2 diabetes mellitus    Hyperthyroidism:    HPI:  He is a 74 year old long term resident of this facility being seen for the management of his chronic illnesses: Cerebrovascular accident (CVA) due to other mechanism/hemiplegia or right dominant side due to cerebrovascular disease:       Hyperlipidemia associated with type 2 diabetes mellitus    Hyperthyroidism. There are no reports of uncontrolled pain; no reports of aspiration present. No reports of agitation or anxiety present.   Past Medical History:  Diagnosis Date  . DVT (deep venous thrombosis) (HCC) 09/26/2012  . Dysphasia   . Fatty liver   . Hemiplegia (HCC)   . Hypertension   . Pneumonia   . Pulmonary embolism (HCC)   . 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(HCC)   . Stroke [facility])     Past Surgical History:  Procedure Laterality Date  . ESOPHAGOGASTRODUODENOSCOPY (EGD) WITH PROPOFOL N/A 05/15/2019   Procedure: ESOPHAGOGASTRODUODENOSCOPY (EGD) WITH PROPOFOL;  Surgeon: Lucretia Roers, MD;  Location: [facility] ORS;   Service: General;  Laterality: N/A;  . FLEXIBLE SIGMOIDOSCOPY N/A 10/21/2016   Procedure: FLEXIBLE SIGMOIDOSCOPY;  Surgeon: Malissa Hippo, MD;  Location: [facility];  Service: Endoscopy;  Laterality: N/A;  . FLEXIBLE SIGMOIDOSCOPY N/A 10/25/2016   Procedure: FLEXIBLE SIGMOIDOSCOPY;  Surgeon: Malissa Hippo, MD;  Location: [facility];  Service: Endoscopy;  Laterality: N/A;  . PEG PLACEMENT N/A 05/15/2019   Procedure: PERCUTANEOUS ENDOSCOPIC GASTROSTOMY (PEG) PLACEMENT;  Surgeon: Lucretia Roers, MD;  Location: [facility] ORS;  Service: General;  Laterality: N/A;  . unable      Social History   Socioeconomic History  . Marital status: Single    Spouse name: Not on file  . Number of children: Not on file  . Years of education: Not on file  . Highest education level: Not on file  Occupational History  . Not on file  Tobacco Use  . Smoking status: Never Smoker  . Smokeless tobacco: Never Used  Vaping Use  . Vaping Use: Never used  Substance and Sexual Activity  . Alcohol use: No    Alcohol/week: 0.0 standard drinks  . Drug use: No  . Sexual activity: Not on file  Other Topics Concern  . Not on file  Social History Narrative  . Not on file   Social Determinants of Health   Financial Resource Strain:   . Difficulty of Paying Living Expenses: Not on file  Food Insecurity:   . Worried About Running  Out of Food in the Last Year: Not on file  . Ran Out of Food in the Last Year: Not on file  Transportation Needs:   . Lack of Transportation (Medical): Not on file  . Lack of Transportation (Non-Medical): Not on file  Physical Activity:   . Days of Exercise per Week: Not on file  . Minutes of Exercise per Session: Not on file  Stress:   . Feeling of Stress : Not on file  Social Connections:   . Frequency of Communication with Friends and Family: Not on file  . Frequency of Social Gatherings with Friends and Family: Not on file  . Attends Religious Services: Not on file  .  Active Member of Clubs or Organizations: Not on file  . Attends BankerClub or Organization Meetings: Not on file  . Marital Status: Not on file  Intimate Partner Violence:   . Fear of Current or Ex-Partner: Not on file  . Emotionally Abused: Not on file  . Physically Abused: Not on file  . Sexually Abused: Not on file   No family history on file.    VITAL SIGNS BP (!) 143/83   Pulse 86   Temp 98.2 F (36.8 C) (Oral)   Resp 20   Ht 5' 8.5" (1.74 m)   Wt 242 lb (109.8 kg)   SpO2 96%   BMI 36.26 kg/m   Outpatient Encounter Medications as of 03/03/2020  Medication Sig  . acetaminophen (TYLENOL) 325 MG tablet Take 650 mg by mouth every 6 (six) hours as needed for moderate pain.   Marland Kitchen. apixaban (ELIQUIS) 5 MG TABS tablet Place 5 mg into feeding tube 2 (two) times daily. For dvt/pe/cva po  . atorvastatin (LIPITOR) 10 MG tablet Take 10 mg by mouth daily.  Marland Kitchen. atropine 1 % ophthalmic solution Place 1 drop under the tongue every 6 (six) hours as needed.  . bisacodyl (DULCOLAX) 10 MG suppository Place 1 suppository (10 mg total) rectally at bedtime.  . carboxymethylcellulose (REFRESH PLUS) 0.5 % SOLN Place 1 drop into the right eye in the morning, at noon, in the evening, and at bedtime.   Marland Kitchen. erythromycin ophthalmic ointment Place 1 application into the right eye 4 (four) times daily. Also apply to right eye - dx corneal scar - continue until next ophthalmology visit At Bedtime  . insulin aspart (NOVOLOG FLEXPEN) 100 UNIT/ML FlexPen Inject 10 Units into the skin 2 (two) times daily with a meal. With Lunch and Supper  . Insulin Glargine (BASAGLAR KWIKPEN) 100 UNIT/ML SOPN Inject 25 Units into the skin at bedtime.   Marland Kitchen. ipratropium-albuterol (DUONEB) 0.5-2.5 (3) MG/3ML SOLN Take 3 mLs by nebulization every 6 (six) hours.   Marland Kitchen. labetalol (NORMODYNE) 200 MG tablet Take 200 mg by mouth daily. For HTN  . lactulose (CHRONULAC) 10 GM/15ML solution Give 30 cc by mouth twice a day  . lisinopril (ZESTRIL) 2.5 MG  tablet Take 2.5 mg by mouth daily. hold for systolic < 110  . loratadine (CLARITIN) 10 MG tablet Take 10 mg by mouth daily.  . methimazole (TAPAZOLE) 5 MG tablet Take 2.5 mg by mouth daily.  . NON FORMULARY Diet- Liquids: Nectar Thick Liquids  Diet:Dysphagia 1 Puree  . OXYGEN Inhale 2 L into the lungs continuous. Every shift to maintain satuation >90%.  Marland Kitchen. sennosides (SENOKOT) 8.8 MG/5ML syrup Take 5 mLs by mouth 2 (two) times daily as needed for mild constipation.  . Vitamins A & D (VITAMIN A & D) ointment Apply 1 application  topically as needed for dry skin. To left heel qshift for prevention.   No facility-administered encounter medications on file as of 03/03/2020.     SIGNIFICANT DIAGNOSTIC EXAMS   PREVIOUS;   01-16-19: chest x-ray: Infiltrate versus atelectasis at LEFT base   05-05-19: chest x-ray: 1. Normal chest x-ray.  2. No tuberculosis is noted   05-08-19: KUB: Findings suggest constipation, fecal impaction or decreased colonic motility. Correlate with risk factors for constipation or fecal stasis, based on frequency or absence of bowel movements. Overall sensitivity is decreased, due to attenuation from patient body habitus.  Soft tissue attenuation results in significantly diminished diagnostic sensitivity and utility.  05-09-19: chest x-ray: Mild bibasilar subsegmental atelectasis or infiltrates are noted   05-09-19: ct of abdomen and pelvis:  1. Moderately large amount of stool in the distal sigmoid colon and rectum. Moderate gaseous distension of the more proximal sigmoid colon without evidence of volvulus or bowel obstruction. 2. Bibasilar lung consolidation concerning for pneumonia. 3. Aortic Atherosclerosis   07-12-18: chest x-ray: Cardiomegaly with low lung volumes and bibasilar collapse/consolidation, left greater than right.  05-16-19: chest x-ray: ETT in good position. Worsened bilateral airspace disease small pleural effusions.  05-19-19: chest x-ray:  Partial but incomplete clearing of opacity from the basis. Patchy airspace opacity remains in the lower lung regions with consolidation in a portion of the left base. There are small pleural effusions bilaterally. Upper lung regions appear clear. Heart upper normal in size. No adenopathy demonstrable by radiography.  NO NEW EXAMS     LABS REVIEWED: PREVIOUS   03-16-19: tsh 2.668 free T4: 0.81 05-01-19: PSA 11.82 05-05-19: wbc 12.3; hgb 16.7; hct 53.4; mcv 98.2 plt 246; blood culture: no growth 05-08-19: PSA 16.39 05-09-19: wbc 13.3; hgb 16.9; hct 56.5; mcv 102.7 plt 261; glucose 689; bun 82; creat 2.78; k+ 5.3; na++ 151; ca 8.9; urine culture: enterococcus faecalis:  Hgb a1c 11.3 05-11-19: glucose 98; bun 36; creat 1.61; k+ 3.8; na++ 158; ca 7.8; phos 2.0 albumin 2.6 05-15-19: wbc 8.2; hgb 12.9; hct 42.2; mcv 100.2 plt 165; glucose 178; bun 15; creat 1.37; k+ 3.5; na++ 143; ca 7.5  05-18-19: wbc 7.8; hgb 13.3; hct 43.2; mcv 99.1 plt 227; glucose 246; bun 28; creat 1.21; k+ 3.2; na++ 139 ca 7.7 05-20-19: wbc 9.5; hgb 13.3; hct 44.0; mcv 98.9 plt 264; glucose 190; bun 25; creat 1.10; k+ 3.7; na++ 138; ca 8.3 ;liver normal albumin 2.4  07-04-19 glucose 99; bun 31; creat 1.19 ;k+ 3.8; na++ 143; ca 8.8 chol 109; ldl 68 trig 57; hdl 30; urine micro-albumin 33.8 07-05-19: tsh 1.257 free T4: 0.66  10-11-19: hgb a1c 7.2   11-05-19: tsh 2.340 01-31-20: wbc 8.3; hgb 13.2; hct 43.7; mcv 100.9 plt 271; glucose 122; bun 24; creat 1.10 ;k+ 4.0; na++ 141; ca 8.8 liver norm albumin 3.1 hgb a1c 7.9; chol 138; ldl 93; trig 57; hdl 34   NO NEW LABS.   Review of Systems  Constitutional: Negative for malaise/fatigue.  Respiratory: Negative for cough and shortness of breath.   Cardiovascular: Negative for chest pain.  Gastrointestinal: Negative for abdominal pain.  Musculoskeletal: Negative for myalgias.  Skin: Negative.   Psychiatric/Behavioral: The patient is not nervous/anxious.    Physical  Exam Constitutional:      General: He is not in acute distress.    Appearance: He is well-developed. He is obese. He is not diaphoretic.  Neck:     Thyroid: No thyromegaly.  Cardiovascular:     Rate  and Rhythm: Normal rate and regular rhythm.     Pulses: Normal pulses.     Heart sounds: Normal heart sounds.  Pulmonary:     Effort: Pulmonary effort is normal. No respiratory distress.     Breath sounds: Normal breath sounds.  Abdominal:     General: Bowel sounds are normal. There is no distension.     Palpations: Abdomen is soft.     Tenderness: There is no abdominal tenderness.  Musculoskeletal:     Cervical back: Neck supple.     Right lower leg: No edema.     Left lower leg: No edema.     Comments: Right hemiplegia   Lymphadenopathy:     Cervical: No cervical adenopathy.  Skin:    General: Skin is warm and dry.  Neurological:     Mental Status: He is alert. Mental status is at baseline.  Psychiatric:        Mood and Affect: Mood normal.      ASSESSMENT/ PLAN:  TODAY:  1. Cerebrovascular accident (CVA) due to other mechanism/hemiplegia or right dominant side due to cerebrovascular disease: is stable will continue eliquis 5 mg twice daily   2. Hyperlipidemia associated with type 2 diabetes mellitus is stable LDL 93 will continue lipitor 10 mg daily   3. Hyperthyroidism: is stable tsh 2.340 free t4: 0.66 will continue tapazole 2.5 mg daily    PREVIOUS   4. 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 Deep vein thrombosis (DVT) of left lower extremity unspecified vein/other pulmonary embolism without acuate cor pulmonale is stable is on long term eliquis 5 mg twice daily   6. 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 No signs of aspiration present.   9. Hypertension associated with type 2 diabetes mellitis: is stable b/p 143/83 will continue labetolol 200 mg daily and lisinopril 2.5 mg daily    MD is aware of resident's narcotic use and is in agreement with current plan of care. We will attempt to wean resident as appropriate.  Synthia Innocent NP [facility]  Contact [phone] Monday through Friday 8am- 5pm  After hours call [phone]

## 2020-03-04 DIAGNOSIS — R279 Unspecified lack of coordination: Secondary | ICD-10-CM | POA: Diagnosis not present

## 2020-03-04 DIAGNOSIS — M6281 Muscle weakness (generalized): Secondary | ICD-10-CM | POA: Diagnosis not present

## 2020-03-04 DIAGNOSIS — G8191 Hemiplegia, unspecified affecting right dominant side: Secondary | ICD-10-CM | POA: Diagnosis not present

## 2020-03-04 DIAGNOSIS — I10 Essential (primary) hypertension: Secondary | ICD-10-CM | POA: Diagnosis not present

## 2020-03-05 DIAGNOSIS — M6281 Muscle weakness (generalized): Secondary | ICD-10-CM | POA: Diagnosis not present

## 2020-03-05 DIAGNOSIS — I10 Essential (primary) hypertension: Secondary | ICD-10-CM | POA: Diagnosis not present

## 2020-03-05 DIAGNOSIS — G8191 Hemiplegia, unspecified affecting right dominant side: Secondary | ICD-10-CM | POA: Diagnosis not present

## 2020-03-05 DIAGNOSIS — R279 Unspecified lack of coordination: Secondary | ICD-10-CM | POA: Diagnosis not present

## 2020-03-06 DIAGNOSIS — I10 Essential (primary) hypertension: Secondary | ICD-10-CM | POA: Diagnosis not present

## 2020-03-06 DIAGNOSIS — R279 Unspecified lack of coordination: Secondary | ICD-10-CM | POA: Diagnosis not present

## 2020-03-06 DIAGNOSIS — M6281 Muscle weakness (generalized): Secondary | ICD-10-CM | POA: Diagnosis not present

## 2020-03-06 DIAGNOSIS — G8191 Hemiplegia, unspecified affecting right dominant side: Secondary | ICD-10-CM | POA: Diagnosis not present

## 2020-03-07 DIAGNOSIS — I10 Essential (primary) hypertension: Secondary | ICD-10-CM | POA: Diagnosis not present

## 2020-03-07 DIAGNOSIS — G8191 Hemiplegia, unspecified affecting right dominant side: Secondary | ICD-10-CM | POA: Diagnosis not present

## 2020-03-07 DIAGNOSIS — R279 Unspecified lack of coordination: Secondary | ICD-10-CM | POA: Diagnosis not present

## 2020-03-07 DIAGNOSIS — M6281 Muscle weakness (generalized): Secondary | ICD-10-CM | POA: Diagnosis not present

## 2020-03-10 DIAGNOSIS — I10 Essential (primary) hypertension: Secondary | ICD-10-CM | POA: Diagnosis not present

## 2020-03-10 DIAGNOSIS — R279 Unspecified lack of coordination: Secondary | ICD-10-CM | POA: Diagnosis not present

## 2020-03-10 DIAGNOSIS — G8191 Hemiplegia, unspecified affecting right dominant side: Secondary | ICD-10-CM | POA: Diagnosis not present

## 2020-03-10 DIAGNOSIS — M6281 Muscle weakness (generalized): Secondary | ICD-10-CM | POA: Diagnosis not present

## 2020-03-11 DIAGNOSIS — G8191 Hemiplegia, unspecified affecting right dominant side: Secondary | ICD-10-CM | POA: Diagnosis not present

## 2020-03-11 DIAGNOSIS — R279 Unspecified lack of coordination: Secondary | ICD-10-CM | POA: Diagnosis not present

## 2020-03-11 DIAGNOSIS — I10 Essential (primary) hypertension: Secondary | ICD-10-CM | POA: Diagnosis not present

## 2020-03-11 DIAGNOSIS — M6281 Muscle weakness (generalized): Secondary | ICD-10-CM | POA: Diagnosis not present

## 2020-03-12 DIAGNOSIS — R279 Unspecified lack of coordination: Secondary | ICD-10-CM | POA: Diagnosis not present

## 2020-03-12 DIAGNOSIS — M6281 Muscle weakness (generalized): Secondary | ICD-10-CM | POA: Diagnosis not present

## 2020-03-12 DIAGNOSIS — G8191 Hemiplegia, unspecified affecting right dominant side: Secondary | ICD-10-CM | POA: Diagnosis not present

## 2020-03-12 DIAGNOSIS — I10 Essential (primary) hypertension: Secondary | ICD-10-CM | POA: Diagnosis not present

## 2020-03-13 DIAGNOSIS — G8191 Hemiplegia, unspecified affecting right dominant side: Secondary | ICD-10-CM | POA: Diagnosis not present

## 2020-03-13 DIAGNOSIS — R279 Unspecified lack of coordination: Secondary | ICD-10-CM | POA: Diagnosis not present

## 2020-03-13 DIAGNOSIS — I10 Essential (primary) hypertension: Secondary | ICD-10-CM | POA: Diagnosis not present

## 2020-03-13 DIAGNOSIS — M6281 Muscle weakness (generalized): Secondary | ICD-10-CM | POA: Diagnosis not present

## 2020-03-14 DIAGNOSIS — M6281 Muscle weakness (generalized): Secondary | ICD-10-CM | POA: Diagnosis not present

## 2020-03-14 DIAGNOSIS — G8191 Hemiplegia, unspecified affecting right dominant side: Secondary | ICD-10-CM | POA: Diagnosis not present

## 2020-03-14 DIAGNOSIS — R279 Unspecified lack of coordination: Secondary | ICD-10-CM | POA: Diagnosis not present

## 2020-03-14 DIAGNOSIS — I10 Essential (primary) hypertension: Secondary | ICD-10-CM | POA: Diagnosis not present

## 2020-03-17 DIAGNOSIS — G8191 Hemiplegia, unspecified affecting right dominant side: Secondary | ICD-10-CM | POA: Diagnosis not present

## 2020-03-17 DIAGNOSIS — M6281 Muscle weakness (generalized): Secondary | ICD-10-CM | POA: Diagnosis not present

## 2020-03-17 DIAGNOSIS — R279 Unspecified lack of coordination: Secondary | ICD-10-CM | POA: Diagnosis not present

## 2020-03-17 DIAGNOSIS — I10 Essential (primary) hypertension: Secondary | ICD-10-CM | POA: Diagnosis not present

## 2020-03-18 DIAGNOSIS — R279 Unspecified lack of coordination: Secondary | ICD-10-CM | POA: Diagnosis not present

## 2020-03-18 DIAGNOSIS — I10 Essential (primary) hypertension: Secondary | ICD-10-CM | POA: Diagnosis not present

## 2020-03-18 DIAGNOSIS — M6281 Muscle weakness (generalized): Secondary | ICD-10-CM | POA: Diagnosis not present

## 2020-03-18 DIAGNOSIS — G8191 Hemiplegia, unspecified affecting right dominant side: Secondary | ICD-10-CM | POA: Diagnosis not present

## 2020-03-19 DIAGNOSIS — I10 Essential (primary) hypertension: Secondary | ICD-10-CM | POA: Diagnosis not present

## 2020-03-19 DIAGNOSIS — G8191 Hemiplegia, unspecified affecting right dominant side: Secondary | ICD-10-CM | POA: Diagnosis not present

## 2020-03-19 DIAGNOSIS — M6281 Muscle weakness (generalized): Secondary | ICD-10-CM | POA: Diagnosis not present

## 2020-03-19 DIAGNOSIS — R279 Unspecified lack of coordination: Secondary | ICD-10-CM | POA: Diagnosis not present

## 2020-03-20 DIAGNOSIS — R279 Unspecified lack of coordination: Secondary | ICD-10-CM | POA: Diagnosis not present

## 2020-03-20 DIAGNOSIS — M6281 Muscle weakness (generalized): Secondary | ICD-10-CM | POA: Diagnosis not present

## 2020-03-20 DIAGNOSIS — I10 Essential (primary) hypertension: Secondary | ICD-10-CM | POA: Diagnosis not present

## 2020-03-20 DIAGNOSIS — G8191 Hemiplegia, unspecified affecting right dominant side: Secondary | ICD-10-CM | POA: Diagnosis not present

## 2020-03-21 DIAGNOSIS — M24541 Contracture, right hand: Secondary | ICD-10-CM | POA: Diagnosis not present

## 2020-03-21 DIAGNOSIS — I10 Essential (primary) hypertension: Secondary | ICD-10-CM | POA: Diagnosis not present

## 2020-03-21 DIAGNOSIS — G8191 Hemiplegia, unspecified affecting right dominant side: Secondary | ICD-10-CM | POA: Diagnosis not present

## 2020-03-21 DIAGNOSIS — M6281 Muscle weakness (generalized): Secondary | ICD-10-CM | POA: Diagnosis not present

## 2020-03-21 DIAGNOSIS — R279 Unspecified lack of coordination: Secondary | ICD-10-CM | POA: Diagnosis not present

## 2020-04-07 ENCOUNTER — Non-Acute Institutional Stay (SKILLED_NURSING_FACILITY): Payer: Medicare Other | Admitting: Adult Health

## 2020-04-07 ENCOUNTER — Encounter: Payer: Self-pay | Admitting: Adult Health

## 2020-04-07 DIAGNOSIS — I82502 Chronic embolism and thrombosis of unspecified deep veins of left lower extremity: Secondary | ICD-10-CM | POA: Diagnosis not present

## 2020-04-07 DIAGNOSIS — E1149 Type 2 diabetes mellitus with other diabetic neurological complication: Secondary | ICD-10-CM

## 2020-04-07 DIAGNOSIS — I2782 Chronic pulmonary embolism: Secondary | ICD-10-CM

## 2020-04-07 DIAGNOSIS — E1165 Type 2 diabetes mellitus with hyperglycemia: Secondary | ICD-10-CM

## 2020-04-07 DIAGNOSIS — K562 Volvulus: Secondary | ICD-10-CM | POA: Diagnosis not present

## 2020-04-07 DIAGNOSIS — Z794 Long term (current) use of insulin: Secondary | ICD-10-CM | POA: Diagnosis not present

## 2020-04-07 DIAGNOSIS — IMO0002 Reserved for concepts with insufficient information to code with codable children: Secondary | ICD-10-CM

## 2020-04-07 NOTE — Progress Notes (Signed)
Location:    [facility] Room Number: 112/W Place of Service:  SNF (31)   CODE STATUS: DNR  Allergies  Allergen Reactions  . Penicillin G   . Penicillins     Has patient had a PCN reaction causing immediate rash, facial/tongue/throat swelling, SOB or lightheadedness with hypotension: unknown Has patient had a PCN reaction causing severe rash involving mucus membranes or skin necrosis: unknown Has patient had a PCN reaction that required hospitalization: unknown Has patient had a PCN reaction occurring within the last 10 years: unknown If all of the above answers are "NO", then may proceed with Cephalosporin use. 5/3 Tolerated Cefepime x 1 dose in ED.     Chief Complaint  Patient presents with  . 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 There are no reports of uncontrolled pain; no reports of anxiety or agitation; no reports of aspiration. His cbg readings remain elevated.    Past Medical History:  Diagnosis Date  . DVT (deep venous thrombosis) (HCC) 09/26/2012  . Dysphasia   . Fatty liver   . Hemiplegia (HCC)   . Hypertension   . Pneumonia   . Pulmonary embolism (HCC)   . 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(HCC)   . Stroke [facility])     Past Surgical History:  Procedure Laterality Date  . ESOPHAGOGASTRODUODENOSCOPY (EGD) WITH PROPOFOL N/A 05/15/2019    Procedure: ESOPHAGOGASTRODUODENOSCOPY (EGD) WITH PROPOFOL;  Surgeon: Lucretia Roers, MD;  Location: [facility] ORS;  Service: General;  Laterality: N/A;  . FLEXIBLE SIGMOIDOSCOPY N/A 10/21/2016   Procedure: FLEXIBLE SIGMOIDOSCOPY;  Surgeon: Malissa Hippo, MD;  Location: [facility] ENDO SUITE;  Service: Endoscopy;  Laterality: N/A;  . FLEXIBLE SIGMOIDOSCOPY N/A 10/25/2016   Procedure: FLEXIBLE SIGMOIDOSCOPY;  Surgeon: Malissa Hippo, MD;  Location: [facility] ENDO SUITE;  Service: Endoscopy;  Laterality: N/A;  . PEG PLACEMENT N/A 05/15/2019   Procedure: PERCUTANEOUS ENDOSCOPIC GASTROSTOMY (PEG) PLACEMENT;  Surgeon: Lucretia Roers, MD;  Location: [facility] ORS;  Service: General;  Laterality: N/A;  . unable      Social History   Socioeconomic History  . Marital status: Single    Spouse name: Not on file  . Number of children: Not on file  . Years of education: Not on file  . Highest education level: Not on file  Occupational History  . Not on file  Tobacco Use  . Smoking status: Never Smoker  . Smokeless tobacco: Never Used  Vaping Use  . Vaping Use: Never used  Substance and Sexual Activity  . Alcohol use: No    Alcohol/week: 0.0 standard drinks  . Drug use: No  . Sexual activity: Not on file  Other Topics Concern  . Not on file  Social History Narrative  . Not on file   Social Determinants of Health   Financial Resource Strain:   . Difficulty of Paying Living Expenses:  Not on file  Food Insecurity:   . Worried About Programme researcher, broadcasting/film/video in the Last Year: Not on file  . Ran Out of Food in the Last Year: Not on file  Transportation Needs:   . Lack of Transportation (Medical): Not on file  . Lack of Transportation (Non-Medical): Not on file  Physical Activity:   . Days of Exercise per Week: Not on file  . Minutes of Exercise per Session: Not on file  Stress:   . Feeling of Stress : Not on file  Social Connections:   . Frequency of Communication with Friends and Family: Not on file  .  Frequency of Social Gatherings with Friends and Family: Not on file  . Attends Religious Services: Not on file  . Active Member of Clubs or Organizations: Not on file  . Attends Banker Meetings: Not on file  . Marital Status: Not on file  Intimate Partner Violence:   . Fear of Current or Ex-Partner: Not on file  . Emotionally Abused: Not on file  . Physically Abused: Not on file  . Sexually Abused: Not on file   No family history on file.    VITAL SIGNS BP 126/61   Pulse 82   Temp 97.6 F (36.4 C)   Resp (!) 22   Ht 5' 8.5" (1.74 m)   Wt 249 lb 6.4 oz (113.1 kg)   SpO2 95%   BMI 37.37 kg/m   Outpatient Encounter Medications as of 04/07/2020  Medication Sig  . acetaminophen (TYLENOL) 325 MG tablet Take 650 mg by mouth every 6 (six) hours as needed for moderate pain.   Marland Kitchen apixaban (ELIQUIS) 5 MG TABS tablet Place 5 mg into feeding tube 2 (two) times daily. For dvt/pe/cva po  . atorvastatin (LIPITOR) 10 MG tablet Take 10 mg by mouth daily.  Marland Kitchen atropine 1 % ophthalmic solution Place 1 drop under the tongue every 6 (six) hours as needed.  . bisacodyl (DULCOLAX) 10 MG suppository Place 1 suppository (10 mg total) rectally at bedtime.  . carboxymethylcellulose (REFRESH PLUS) 0.5 % SOLN Place 1 drop into the right eye in the morning, at noon, in the evening, and at bedtime.   Marland Kitchen erythromycin ophthalmic ointment Place 1 application into the right eye 4 (four) times daily. Also apply to right eye - dx corneal scar - continue until next ophthalmology visit At Bedtime  . insulin aspart (NOVOLOG FLEXPEN) 100 UNIT/ML FlexPen Inject 10 Units into the skin 2 (two) times daily with a meal. With Lunch and Supper  . Insulin Glargine (BASAGLAR KWIKPEN) 100 UNIT/ML SOPN Inject 25 Units into the skin at bedtime.   Marland Kitchen ipratropium-albuterol (DUONEB) 0.5-2.5 (3) MG/3ML SOLN Take 3 mLs by nebulization every 6 (six) hours.   Marland Kitchen labetalol (NORMODYNE) 200 MG tablet Take 200 mg by mouth daily. For  HTN  . lactulose (CHRONULAC) 10 GM/15ML solution Give 30 cc by mouth twice a day  . lisinopril (ZESTRIL) 2.5 MG tablet Take 2.5 mg by mouth daily. hold for systolic < 110  . loratadine (CLARITIN) 10 MG tablet Take 10 mg by mouth daily.  . methimazole (TAPAZOLE) 5 MG tablet Take 2.5 mg by mouth daily.  . NON FORMULARY Diet- Liquids: Nectar Thick Liquids  Diet:Dysphagia 1 Puree  . OXYGEN Inhale 2 L into the lungs continuous. Every shift to maintain satuation >90%.  Marland Kitchen sennosides (SENOKOT) 8.8 MG/5ML syrup Take 5 mLs by mouth 2 (two) times daily as needed for mild constipation.  Marland Kitchen  Vitamins A & D (VITAMIN A & D) ointment Apply 1 application topically as needed for dry skin. To left heel qshift for prevention.   No facility-administered encounter medications on file as of 04/07/2020.     SIGNIFICANT DIAGNOSTIC EXAMS   PREVIOUS;   01-16-19: chest x-ray: Infiltrate versus atelectasis at LEFT base   05-05-19: chest x-ray: 1. Normal chest x-ray.  2. No tuberculosis is noted   05-08-19: KUB: Findings suggest constipation, fecal impaction or decreased colonic motility. Correlate with risk factors for constipation or fecal stasis, based on frequency or absence of bowel movements. Overall sensitivity is decreased, due to attenuation from patient body habitus.  Soft tissue attenuation results in significantly diminished diagnostic sensitivity and utility.  05-09-19: chest x-ray: Mild bibasilar subsegmental atelectasis or infiltrates are noted   05-09-19: ct of abdomen and pelvis:  1. Moderately large amount of stool in the distal sigmoid colon and rectum. Moderate gaseous distension of the more proximal sigmoid colon without evidence of volvulus or bowel obstruction. 2. Bibasilar lung consolidation concerning for pneumonia. 3. Aortic Atherosclerosis   07-12-18: chest x-ray: Cardiomegaly with low lung volumes and bibasilar collapse/consolidation, left greater than right.  05-16-19: chest  x-ray: ETT in good position. Worsened bilateral airspace disease small pleural effusions.  05-19-19: chest x-ray: Partial but incomplete clearing of opacity from the basis. Patchy airspace opacity remains in the lower lung regions with consolidation in a portion of the left base. There are small pleural effusions bilaterally. Upper lung regions appear clear. Heart upper normal in size. No adenopathy demonstrable by radiography.  NO NEW EXAMS     LABS REVIEWED: PREVIOUS   03-16-19: tsh 2.668 free T4: 0.81 05-01-19: PSA 11.82 05-05-19: wbc 12.3; hgb 16.7; hct 53.4; mcv 98.2 plt 246; blood culture: no growth 05-08-19: PSA 16.39 05-09-19: wbc 13.3; hgb 16.9; hct 56.5; mcv 102.7 plt 261; glucose 689; bun 82; creat 2.78; k+ 5.3; na++ 151; ca 8.9; urine culture: enterococcus faecalis:  Hgb a1c 11.3 05-11-19: glucose 98; bun 36; creat 1.61; k+ 3.8; na++ 158; ca 7.8; phos 2.0 albumin 2.6 05-15-19: wbc 8.2; hgb 12.9; hct 42.2; mcv 100.2 plt 165; glucose 178; bun 15; creat 1.37; k+ 3.5; na++ 143; ca 7.5  05-18-19: wbc 7.8; hgb 13.3; hct 43.2; mcv 99.1 plt 227; glucose 246; bun 28; creat 1.21; k+ 3.2; na++ 139 ca 7.7 05-20-19: wbc 9.5; hgb 13.3; hct 44.0; mcv 98.9 plt 264; glucose 190; bun 25; creat 1.10; k+ 3.7; na++ 138; ca 8.3 ;liver normal albumin 2.4  07-04-19 glucose 99; bun 31; creat 1.19 ;k+ 3.8; na++ 143; ca 8.8 chol 109; ldl 68 trig 57; hdl 30; urine micro-albumin 33.8 07-05-19: tsh 1.257 free T4: 0.66  10-11-19: hgb a1c 7.2   11-05-19: tsh 2.340 01-31-20: wbc 8.3; hgb 13.2; hct 43.7; mcv 100.9 plt 271; glucose 122; bun 24; creat 1.10 ;k+ 4.0; na++ 141; ca 8.8 liver norm albumin 3.1 hgb a1c 7.9; chol 138; ldl 93; trig 57; hdl 34   NO NEW LABS.   Review of Systems  Constitutional: Negative for malaise/fatigue.  Respiratory: Negative for cough and shortness of breath.   Cardiovascular: Negative for chest pain, palpitations and leg swelling.  Gastrointestinal: Negative for abdominal pain,  constipation and heartburn.  Musculoskeletal: Negative for back pain, joint pain and myalgias.  Skin: Negative.   Neurological: Negative for dizziness.  Psychiatric/Behavioral: The patient is not nervous/anxious.       Physical Exam Constitutional:      General: He is not in  acute distress.    Appearance: He is well-developed. He is obese. He is not diaphoretic.  Neck:     Thyroid: No thyromegaly.  Cardiovascular:     Rate and Rhythm: Normal rate and regular rhythm.     Pulses: Normal pulses.     Heart sounds: Normal heart sounds.  Pulmonary:     Effort: Pulmonary effort is normal. No respiratory distress.     Breath sounds: Normal breath sounds.  Abdominal:     General: Bowel sounds are normal. There is no distension.     Palpations: Abdomen is soft.     Tenderness: There is no abdominal tenderness.  Musculoskeletal:     Cervical back: Neck supple.     Right lower leg: No edema.     Left lower leg: No edema.     Comments: Right hemiplegia   Lymphadenopathy:     Cervical: No cervical adenopathy.  Skin:    General: Skin is warm and dry.  Neurological:     Mental Status: He is alert. Mental status is at baseline.  Psychiatric:        Mood and Affect: Mood normal.      ASSESSMENT/ PLAN:  TODAY:  1. Sigmoid volvulus: is stable will continue senna twice daily as needed  2. Deep vein thrombosis (DVT) of left lower extremity unspecified vein/ other pulmonary embolism without acute cor pulmonale is stable is on long term eliquis 5 mg twice daily   3. 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 No signs of aspiration present.   6. Hypertension associated with type 2 diabetes mellitis: is stable b/p 126/61 will continue labetolol 200 mg daily and lisinopril 2.5 mg daily   7. Cerebrovascular accident (CVA) due to other mechanism/hemiplegia or right dominant side due to cerebrovascular disease: is stable will continue eliquis 5 mg twice daily   8. Hyperlipidemia associated with type 2 diabetes mellitus is stable LDL 93 will continue lipitor 10 mg daily   9. Hyperthyroidism: is stable tsh 2.340 free t4: 0.66 will continue tapazole 2.5 mg daily            MD is aware of resident's narcotic use and is in agreement with current plan of care. We will attempt to wean resident as appropriate.  Synthia Innocenteborah Gayla Benn NP [facility]  Contact [phone] Monday through Friday 8am- 5pm  After hours call [phone]

## 2020-04-19 DIAGNOSIS — M6281 Muscle weakness (generalized): Secondary | ICD-10-CM | POA: Diagnosis not present

## 2020-04-19 DIAGNOSIS — R279 Unspecified lack of coordination: Secondary | ICD-10-CM | POA: Diagnosis not present

## 2020-04-19 DIAGNOSIS — M24541 Contracture, right hand: Secondary | ICD-10-CM | POA: Diagnosis not present

## 2020-04-19 DIAGNOSIS — G8191 Hemiplegia, unspecified affecting right dominant side: Secondary | ICD-10-CM | POA: Diagnosis not present

## 2020-04-19 DIAGNOSIS — I10 Essential (primary) hypertension: Secondary | ICD-10-CM | POA: Diagnosis not present

## 2020-04-22 DIAGNOSIS — Z23 Encounter for immunization: Secondary | ICD-10-CM | POA: Diagnosis not present

## 2020-04-22 DIAGNOSIS — I10 Essential (primary) hypertension: Secondary | ICD-10-CM | POA: Diagnosis not present

## 2020-04-22 DIAGNOSIS — M24541 Contracture, right hand: Secondary | ICD-10-CM | POA: Diagnosis not present

## 2020-04-22 DIAGNOSIS — G8191 Hemiplegia, unspecified affecting right dominant side: Secondary | ICD-10-CM | POA: Diagnosis not present

## 2020-04-22 DIAGNOSIS — R279 Unspecified lack of coordination: Secondary | ICD-10-CM | POA: Diagnosis not present

## 2020-04-22 DIAGNOSIS — M6281 Muscle weakness (generalized): Secondary | ICD-10-CM | POA: Diagnosis not present

## 2020-04-23 DIAGNOSIS — G8191 Hemiplegia, unspecified affecting right dominant side: Secondary | ICD-10-CM | POA: Diagnosis not present

## 2020-04-23 DIAGNOSIS — R279 Unspecified lack of coordination: Secondary | ICD-10-CM | POA: Diagnosis not present

## 2020-04-23 DIAGNOSIS — M6281 Muscle weakness (generalized): Secondary | ICD-10-CM | POA: Diagnosis not present

## 2020-04-23 DIAGNOSIS — M24541 Contracture, right hand: Secondary | ICD-10-CM | POA: Diagnosis not present

## 2020-04-23 DIAGNOSIS — I10 Essential (primary) hypertension: Secondary | ICD-10-CM | POA: Diagnosis not present

## 2020-04-24 ENCOUNTER — Encounter: Payer: Self-pay | Admitting: Adult Health

## 2020-04-24 ENCOUNTER — Non-Acute Institutional Stay (SKILLED_NURSING_FACILITY): Payer: Medicare Other | Admitting: Adult Health

## 2020-04-24 DIAGNOSIS — E1165 Type 2 diabetes mellitus with hyperglycemia: Secondary | ICD-10-CM | POA: Diagnosis not present

## 2020-04-24 DIAGNOSIS — I10 Essential (primary) hypertension: Secondary | ICD-10-CM | POA: Diagnosis not present

## 2020-04-24 DIAGNOSIS — IMO0002 Reserved for concepts with insufficient information to code with codable children: Secondary | ICD-10-CM

## 2020-04-24 DIAGNOSIS — G8191 Hemiplegia, unspecified affecting right dominant side: Secondary | ICD-10-CM | POA: Diagnosis not present

## 2020-04-24 DIAGNOSIS — Z23 Encounter for immunization: Secondary | ICD-10-CM | POA: Diagnosis not present

## 2020-04-24 DIAGNOSIS — E1149 Type 2 diabetes mellitus with other diabetic neurological complication: Secondary | ICD-10-CM

## 2020-04-24 DIAGNOSIS — Z794 Long term (current) use of insulin: Secondary | ICD-10-CM | POA: Diagnosis not present

## 2020-04-24 DIAGNOSIS — M24541 Contracture, right hand: Secondary | ICD-10-CM | POA: Diagnosis not present

## 2020-04-24 DIAGNOSIS — M6281 Muscle weakness (generalized): Secondary | ICD-10-CM | POA: Diagnosis not present

## 2020-04-24 DIAGNOSIS — R279 Unspecified lack of coordination: Secondary | ICD-10-CM | POA: Diagnosis not present

## 2020-04-24 NOTE — Progress Notes (Signed)
Location:    [facility] Room Number: 112/W Place of Service:  SNF (31)   CODE STATUS: DNR  Allergies  Allergen Reactions  . Penicillin G   . Penicillins     Has patient had a PCN reaction causing immediate rash, facial/tongue/throat swelling, SOB or lightheadedness with hypotension: unknown Has patient had a PCN reaction causing severe rash involving mucus membranes or skin necrosis: unknown Has patient had a PCN reaction that required hospitalization: unknown Has patient had a PCN reaction occurring within the last 10 years: unknown If all of the above answers are "NO", then may proceed with Cephalosporin use. 5/3 Tolerated Cefepime x 1 dose in ED.     Chief Complaint  Patient presents with  . Acute Visit    Diabetes    HPI:  His cbg readings are all elevated over 200. He is presently taking basaglar 27 units nightly and novolog 15 units with lunch and supper. There are no reports of excessive hunger or thirst; no reports of anxiety or agitation. There are no reports of hypoglycemia.    Past Medical History:  Diagnosis Date  . DVT (deep venous thrombosis) (HCC) 09/26/2012  . Dysphasia   . Fatty liver   . Hemiplegia (HCC)   . Hypertension   . Pneumonia   . Pulmonary embolism (HCC)   . 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(HCC)   . Stroke [facility])     Past Surgical History:  Procedure Laterality Date  . ESOPHAGOGASTRODUODENOSCOPY (EGD) WITH PROPOFOL N/A 05/15/2019   Procedure: ESOPHAGOGASTRODUODENOSCOPY (EGD) WITH PROPOFOL;  Surgeon: Lucretia Roers, MD;  Location: [facility] ORS;  Service: General;  Laterality: N/A;  . FLEXIBLE SIGMOIDOSCOPY N/A 10/21/2016   Procedure: FLEXIBLE SIGMOIDOSCOPY;  Surgeon: Malissa Hippo, MD;  Location: [facility];  Service: Endoscopy;  Laterality: N/A;  . FLEXIBLE SIGMOIDOSCOPY N/A 10/25/2016   Procedure: FLEXIBLE SIGMOIDOSCOPY;  Surgeon: Malissa Hippo, MD;  [facility]: [facility];  Service: Endoscopy;  Laterality: N/A;  . PEG  PLACEMENT N/A 05/15/2019   Procedure: PERCUTANEOUS ENDOSCOPIC GASTROSTOMY (PEG) PLACEMENT;  Surgeon: Lucretia Roers, MD;  Location: [facility];  Service: General;  Laterality: N/A;  . unable      Social History   Socioeconomic History  . Marital status: Single    Spouse name: Not on file  . Number of children: Not on file  . Years of education: Not on file  . Highest education level: Not on file  Occupational History  . Not on file  Tobacco Use  . Smoking status: Never Smoker  . Smokeless tobacco: Never Used  Vaping Use  . Vaping Use: Never used  Substance and Sexual Activity  . Alcohol use: No    Alcohol/week: 0.0 standard drinks  . Drug use: No  . Sexual activity: Not on file  Other Topics Concern  . Not on file  Social History Narrative  . Not on file   Social Determinants of Health   Financial Resource Strain:   . Difficulty of Paying Living Expenses: Not on file  Food Insecurity:   . Worried About Programme researcher, broadcasting/film/video in the Last Year: Not on file  . Ran Out of Food in the Last Year: Not on file  Transportation Needs:   . Lack of Transportation (Medical): Not on file  . Lack of Transportation (Non-Medical): Not on file  Physical Activity:   . Days of Exercise per Week: Not on file  . Minutes of Exercise per Session: Not on file  Stress:   . Feeling of Stress : Not on file  Social Connections:   . Frequency of Communication with Friends and Family: Not on file  . Frequency of Social Gatherings with Friends and Family: Not on file  . Attends Religious Services: Not on file  . Active Member of Clubs or Organizations: Not on file  . Attends Banker Meetings: Not on file  . Marital Status: Not on file  Intimate Partner Violence:   . Fear of Current or Ex-Partner: Not on file  . Emotionally Abused: Not on file  . Physically Abused: Not on file  . Sexually Abused: Not on file   No family history on file.    VITAL SIGNS BP 113/75   Pulse 86    Temp 98.5 F (36.9 C)   Resp 20   Ht 5' 8.5" (1.74 m)   Wt 246 lb (111.6 kg)   SpO2 96%   BMI 36.86 kg/m   Outpatient Encounter Medications as of 04/24/2020  Medication Sig  . acetaminophen (TYLENOL) 325 MG tablet Take 650 mg by mouth every 6 (six) hours as needed for moderate pain.   Marland Kitchen apixaban (ELIQUIS) 5 MG TABS tablet Place 5 mg into feeding tube 2 (two) times daily. For dvt/pe/cva po  . atorvastatin (LIPITOR) 10 MG tablet Take 10 mg by mouth daily.  Marland Kitchen atropine 1 % ophthalmic solution Place 1 drop under the tongue every 6 (six) hours as needed.  . bisacodyl (DULCOLAX) 10 MG suppository Place 1 suppository (10 mg total) rectally at bedtime.  . carboxymethylcellulose (REFRESH PLUS) 0.5 % SOLN Place 1 drop into the right eye in the morning, at noon, in the evening, and at bedtime.   Marland Kitchen erythromycin ophthalmic ointment Place 1 application into the right eye 4 (four) times daily. Also apply to right eye - dx corneal scar - continue until next ophthalmology visit At Bedtime  . insulin aspart (NOVOLOG FLEXPEN) 100 UNIT/ML FlexPen Inject 15 Units into the skin 2 (two) times daily with a meal. With Lunch and Supper  . Insulin Glargine (BASAGLAR KWIKPEN) 100 UNIT/ML SOPN Inject 27 Units into the skin at bedtime.   Marland Kitchen ipratropium-albuterol (DUONEB) 0.5-2.5 (3) MG/3ML SOLN Take 3 mLs by nebulization every 6 (six) hours.   Marland Kitchen labetalol (NORMODYNE) 200 MG tablet Take 200 mg by mouth daily. For HTN  . lactulose (CHRONULAC) 10 GM/15ML solution Give 30 cc by mouth twice a day  . lisinopril (ZESTRIL) 2.5 MG tablet Take 2.5 mg by mouth daily. hold for systolic < 110  . loratadine (CLARITIN) 10 MG tablet Take 10 mg by mouth daily.  . methimazole (TAPAZOLE) 5 MG tablet Take 2.5 mg by mouth daily.  . NON FORMULARY Diet- Liquids: Nectar Thick Liquids  Diet:Dysphagia 1 Puree  . OXYGEN Inhale 2 L into the lungs continuous. Every shift to maintain satuation >90%.  Marland Kitchen sennosides (SENOKOT) 8.8 MG/5ML syrup Take 5  mLs by mouth 2 (two) times daily as needed for mild constipation.  . Vitamins A & D (VITAMIN A & D) ointment Apply 1 application topically as needed for dry skin. To left heel qshift for prevention.   No facility-administered encounter medications on file as of 04/24/2020.     SIGNIFICANT DIAGNOSTIC EXAMS   PREVIOUS;   01-16-19: chest x-ray: Infiltrate versus atelectasis at LEFT base   05-05-19: chest x-ray: 1. Normal chest x-ray.  2. No tuberculosis is noted   05-08-19: KUB: Findings suggest constipation, fecal impaction or decreased colonic motility.  Correlate with risk factors for constipation or fecal stasis, based on frequency or absence of bowel movements. Overall sensitivity is decreased, due to attenuation from patient body habitus.  Soft tissue attenuation results in significantly diminished diagnostic sensitivity and utility.  05-09-19: chest x-ray: Mild bibasilar subsegmental atelectasis or infiltrates are noted   05-09-19: ct of abdomen and pelvis:  1. Moderately large amount of stool in the distal sigmoid colon and rectum. Moderate gaseous distension of the more proximal sigmoid colon without evidence of volvulus or bowel obstruction. 2. Bibasilar lung consolidation concerning for pneumonia. 3. Aortic Atherosclerosis   07-12-18: chest x-ray: Cardiomegaly with low lung volumes and bibasilar collapse/consolidation, left greater than right.  05-16-19: chest x-ray: ETT in good position. Worsened bilateral airspace disease small pleural effusions.  05-19-19: chest x-ray: Partial but incomplete clearing of opacity from the basis. Patchy airspace opacity remains in the lower lung regions with consolidation in a portion of the left base. There are small pleural effusions bilaterally. Upper lung regions appear clear. Heart upper normal in size. No adenopathy demonstrable by radiography.  NO NEW EXAMS     LABS REVIEWED: PREVIOUS   05-01-19: PSA 11.82 05-05-19: wbc 12.3;  hgb 16.7; hct 53.4; mcv 98.2 plt 246; blood culture: no growth 05-08-19: PSA 16.39 05-09-19: wbc 13.3; hgb 16.9; hct 56.5; mcv 102.7 plt 261; glucose 689; bun 82; creat 2.78; k+ 5.3; na++ 151; ca 8.9; urine culture: enterococcus faecalis:  Hgb a1c 11.3 05-11-19: glucose 98; bun 36; creat 1.61; k+ 3.8; na++ 158; ca 7.8; phos 2.0 albumin 2.6 05-15-19: wbc 8.2; hgb 12.9; hct 42.2; mcv 100.2 plt 165; glucose 178; bun 15; creat 1.37; k+ 3.5; na++ 143; ca 7.5  05-18-19: wbc 7.8; hgb 13.3; hct 43.2; mcv 99.1 plt 227; glucose 246; bun 28; creat 1.21; k+ 3.2; na++ 139 ca 7.7 05-20-19: wbc 9.5; hgb 13.3; hct 44.0; mcv 98.9 plt 264; glucose 190; bun 25; creat 1.10; k+ 3.7; na++ 138; ca 8.3 ;liver normal albumin 2.4  07-04-19 glucose 99; bun 31; creat 1.19 ;k+ 3.8; na++ 143; ca 8.8 chol 109; ldl 68 trig 57; hdl 30; urine micro-albumin 33.8 07-05-19: tsh 1.257 free T4: 0.66  10-11-19: hgb a1c 7.2   11-05-19: tsh 2.340 01-31-20: wbc 8.3; hgb 13.2; hct 43.7; mcv 100.9 plt 271; glucose 122; bun 24; creat 1.10 ;k+ 4.0; na++ 141; ca 8.8 liver norm albumin 3.1 hgb a1c 7.9; chol 138; ldl 93; trig 57; hdl 34   NO NEW LABS.   Review of Systems  Unable to perform ROS: Dementia (unable to participate )    Physical Exam Constitutional:      General: He is not in acute distress.    Appearance: He is well-developed. He is obese. He is not diaphoretic.  Neck:     Thyroid: No thyromegaly.  Cardiovascular:     Rate and Rhythm: Normal rate and regular rhythm.     Pulses: Normal pulses.     Heart sounds: Normal heart sounds.  Pulmonary:     Effort: Pulmonary effort is normal. No respiratory distress.     Breath sounds: Normal breath sounds.  Abdominal:     General: Bowel sounds are normal. There is no distension.     Palpations: Abdomen is soft.     Tenderness: There is no abdominal tenderness.  Musculoskeletal:     Cervical back: Neck supple.     Right lower leg: No edema.     Left lower leg: No edema.  Comments: Right hemiplegia  Lymphadenopathy:     Cervical: No cervical adenopathy.  Skin:    General: Skin is warm and dry.  Neurological:     Mental Status: He is alert. Mental status is at baseline.  Psychiatric:        Mood and Affect: Mood normal.       ASSESSMENT/ PLAN:  TODAY  1. 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    MD is aware of resident's narcotic use and is in agreement with current plan of care. We will attempt to wean resident as appropriate.  Synthia Innocent NP [facility]  Contact [phone] Monday through Friday 8am- 5pm  After hours call [phone]

## 2020-04-25 DIAGNOSIS — I10 Essential (primary) hypertension: Secondary | ICD-10-CM | POA: Diagnosis not present

## 2020-04-25 DIAGNOSIS — M6281 Muscle weakness (generalized): Secondary | ICD-10-CM | POA: Diagnosis not present

## 2020-04-25 DIAGNOSIS — G8191 Hemiplegia, unspecified affecting right dominant side: Secondary | ICD-10-CM | POA: Diagnosis not present

## 2020-04-25 DIAGNOSIS — R279 Unspecified lack of coordination: Secondary | ICD-10-CM | POA: Diagnosis not present

## 2020-04-25 DIAGNOSIS — M24541 Contracture, right hand: Secondary | ICD-10-CM | POA: Diagnosis not present

## 2020-04-28 DIAGNOSIS — M6281 Muscle weakness (generalized): Secondary | ICD-10-CM | POA: Diagnosis not present

## 2020-04-28 DIAGNOSIS — I10 Essential (primary) hypertension: Secondary | ICD-10-CM | POA: Diagnosis not present

## 2020-04-28 DIAGNOSIS — G8191 Hemiplegia, unspecified affecting right dominant side: Secondary | ICD-10-CM | POA: Diagnosis not present

## 2020-04-28 DIAGNOSIS — M24541 Contracture, right hand: Secondary | ICD-10-CM | POA: Diagnosis not present

## 2020-04-28 DIAGNOSIS — R279 Unspecified lack of coordination: Secondary | ICD-10-CM | POA: Diagnosis not present

## 2020-04-29 DIAGNOSIS — M24541 Contracture, right hand: Secondary | ICD-10-CM | POA: Diagnosis not present

## 2020-04-29 DIAGNOSIS — I10 Essential (primary) hypertension: Secondary | ICD-10-CM | POA: Diagnosis not present

## 2020-04-29 DIAGNOSIS — R279 Unspecified lack of coordination: Secondary | ICD-10-CM | POA: Diagnosis not present

## 2020-04-29 DIAGNOSIS — M6281 Muscle weakness (generalized): Secondary | ICD-10-CM | POA: Diagnosis not present

## 2020-04-29 DIAGNOSIS — G8191 Hemiplegia, unspecified affecting right dominant side: Secondary | ICD-10-CM | POA: Diagnosis not present

## 2020-04-30 DIAGNOSIS — I10 Essential (primary) hypertension: Secondary | ICD-10-CM | POA: Diagnosis not present

## 2020-04-30 DIAGNOSIS — R279 Unspecified lack of coordination: Secondary | ICD-10-CM | POA: Diagnosis not present

## 2020-04-30 DIAGNOSIS — G8191 Hemiplegia, unspecified affecting right dominant side: Secondary | ICD-10-CM | POA: Diagnosis not present

## 2020-04-30 DIAGNOSIS — M24541 Contracture, right hand: Secondary | ICD-10-CM | POA: Diagnosis not present

## 2020-04-30 DIAGNOSIS — M6281 Muscle weakness (generalized): Secondary | ICD-10-CM | POA: Diagnosis not present

## 2020-05-01 DIAGNOSIS — M6281 Muscle weakness (generalized): Secondary | ICD-10-CM | POA: Diagnosis not present

## 2020-05-01 DIAGNOSIS — M24541 Contracture, right hand: Secondary | ICD-10-CM | POA: Diagnosis not present

## 2020-05-01 DIAGNOSIS — I10 Essential (primary) hypertension: Secondary | ICD-10-CM | POA: Diagnosis not present

## 2020-05-01 DIAGNOSIS — G8191 Hemiplegia, unspecified affecting right dominant side: Secondary | ICD-10-CM | POA: Diagnosis not present

## 2020-05-01 DIAGNOSIS — R279 Unspecified lack of coordination: Secondary | ICD-10-CM | POA: Diagnosis not present

## 2020-05-02 DIAGNOSIS — M2142 Flat foot [pes planus] (acquired), left foot: Secondary | ICD-10-CM | POA: Diagnosis not present

## 2020-05-02 DIAGNOSIS — M2141 Flat foot [pes planus] (acquired), right foot: Secondary | ICD-10-CM | POA: Diagnosis not present

## 2020-05-02 DIAGNOSIS — I739 Peripheral vascular disease, unspecified: Secondary | ICD-10-CM | POA: Diagnosis not present

## 2020-05-02 DIAGNOSIS — B351 Tinea unguium: Secondary | ICD-10-CM | POA: Diagnosis not present

## 2020-05-05 ENCOUNTER — Encounter: Payer: Self-pay | Admitting: Adult Health

## 2020-05-05 ENCOUNTER — Non-Acute Institutional Stay (SKILLED_NURSING_FACILITY): Payer: Medicare Other | Admitting: Adult Health

## 2020-05-05 DIAGNOSIS — I700 Atherosclerosis of aorta: Secondary | ICD-10-CM | POA: Diagnosis not present

## 2020-05-05 DIAGNOSIS — I69391 Dysphagia following cerebral infarction: Secondary | ICD-10-CM

## 2020-05-05 DIAGNOSIS — J410 Simple chronic bronchitis: Secondary | ICD-10-CM

## 2020-05-05 NOTE — Progress Notes (Signed)
Location:    [facility] Room Number: 112/W Place of Service:  SNF (31)   CODE STATUS: DNR  Allergies  Allergen Reactions  . Penicillin G   . Penicillins     Has patient had a PCN reaction causing immediate rash, facial/tongue/throat swelling, SOB or lightheadedness with hypotension: unknown Has patient had a PCN reaction causing severe rash involving mucus membranes or skin necrosis: unknown Has patient had a PCN reaction that required hospitalization: unknown Has patient had a PCN reaction occurring within the last 10 years: unknown If all of the above answers are "NO", then may proceed with Cephalosporin use. 5/3 Tolerated Cefepime x 1 dose in ED.     Chief Complaint  Patient presents with  . 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 There are no reports of active aspiration present. No reports of of uncontrolled pain; no reports of worsening cough or shortness of breath.   Past Medical History:  Diagnosis Date  . DVT (deep venous thrombosis) (HCC) 09/26/2012  . Dysphasia   . Fatty liver   . Hemiplegia (HCC)   . Hypertension   . Pneumonia   . Pulmonary embolism (HCC)   . 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(HCC)   . Stroke [facility])     Past Surgical History:  Procedure Laterality Date  . ESOPHAGOGASTRODUODENOSCOPY (EGD) WITH PROPOFOL N/A 05/15/2019   Procedure: ESOPHAGOGASTRODUODENOSCOPY (EGD) WITH PROPOFOL;  Surgeon: Lucretia Roers, MD;  Location: [facility] ORS;  Service: General;  Laterality: N/A;  . FLEXIBLE SIGMOIDOSCOPY N/A 10/21/2016   Procedure: FLEXIBLE SIGMOIDOSCOPY;  Surgeon: Malissa Hippo, MD;  Location: [facility] ENDO SUITE;  Service: Endoscopy;  Laterality:  N/A;  . FLEXIBLE SIGMOIDOSCOPY N/A 10/25/2016   Procedure: FLEXIBLE SIGMOIDOSCOPY;  Surgeon: Malissa Hippo, MD;  Location: [facility] ENDO SUITE;  Service: Endoscopy;  Laterality: N/A;  . PEG PLACEMENT N/A 05/15/2019   Procedure: PERCUTANEOUS ENDOSCOPIC GASTROSTOMY (PEG) PLACEMENT;  Surgeon: Lucretia Roers, MD;  Location: [facility] ORS;  Service: General;  Laterality: N/A;  . unable      Social History   Socioeconomic History  . Marital status: Single    Spouse name: Not on file  . Number of children: Not on file  . Years of education: Not on file  . Highest education level: Not on file  Occupational History  . Not on file  Tobacco Use  . Smoking status: Never Smoker  . Smokeless tobacco: Never Used  Vaping Use  . Vaping Use: Never used  Substance and Sexual Activity  . Alcohol use: No    Alcohol/week: 0.0 standard drinks  . Drug use: No  . Sexual activity: Not on file  Other Topics Concern  . Not on file  Social History Narrative  . Not on file   Social Determinants of Health   Financial Resource Strain:   . Difficulty of Paying Living Expenses: Not on file  Food Insecurity:   . Worried About Programme researcher, broadcasting/film/video in the Last Year: Not on file  . Ran Out of Food in the Last Year: Not on file  Transportation Needs:   . Lack of Transportation (Medical): Not  on file  . Lack of Transportation (Non-Medical): Not on file  Physical Activity:   . Days of Exercise per Week: Not on file  . Minutes of Exercise per Session: Not on file  Stress:   . Feeling of Stress : Not on file  Social Connections:   . Frequency of Communication with Friends and Family: Not on file  . Frequency of Social Gatherings with Friends and Family: Not on file  . Attends Religious Services: Not on file  . Active Member of Clubs or Organizations: Not on file  . Attends Banker Meetings: Not on file  . Marital Status: Not on file  Intimate Partner Violence:   . Fear of Current or Ex-Partner: Not  on file  . Emotionally Abused: Not on file  . Physically Abused: Not on file  . Sexually Abused: Not on file   History reviewed. No pertinent family history.    VITAL SIGNS BP 126/83   Pulse 83   Temp 97.9 F (36.6 C)   Resp 18   Ht 5' 8.5" (1.74 m)   Wt 246 lb (111.6 kg)   SpO2 99%   BMI 36.86 kg/m   Outpatient Encounter Medications as of 05/05/2020  Medication Sig  . acetaminophen (TYLENOL) 325 MG tablet Take 650 mg by mouth every 6 (six) hours as needed for moderate pain.   Marland Kitchen apixaban (ELIQUIS) 5 MG TABS tablet Place 5 mg into feeding tube 2 (two) times daily. For dvt/pe/cva po  . atorvastatin (LIPITOR) 10 MG tablet Take 10 mg by mouth daily.  Marland Kitchen atropine 1 % ophthalmic solution Place 1 drop under the tongue every 6 (six) hours as needed.  . bisacodyl (DULCOLAX) 10 MG suppository Place 1 suppository (10 mg total) rectally at bedtime.  . carboxymethylcellulose (REFRESH PLUS) 0.5 % SOLN Place 1 drop into the right eye in the morning, at noon, in the evening, and at bedtime.   Marland Kitchen erythromycin ophthalmic ointment Place 1 application into the right eye 2 (two) times daily. Also apply to right eye - dx corneal scar - continue until next ophthalmology visit At Bedtime  . insulin aspart (NOVOLOG FLEXPEN) 100 UNIT/ML FlexPen Inject 15 Units into the skin 2 (two) times daily with a meal. With Lunch and Supper  . Insulin Glargine (BASAGLAR KWIKPEN) 100 UNIT/ML SOPN Inject 31 Units into the skin at bedtime.   Marland Kitchen ipratropium-albuterol (DUONEB) 0.5-2.5 (3) MG/3ML SOLN Take 3 mLs by nebulization every 6 (six) hours.   Marland Kitchen labetalol (NORMODYNE) 200 MG tablet Take 200 mg by mouth daily. For HTN  . lactulose (CHRONULAC) 10 GM/15ML solution Give 30 cc by mouth twice a day  . lisinopril (ZESTRIL) 2.5 MG tablet Take 2.5 mg by mouth daily. hold for systolic < 110  . loratadine (CLARITIN) 10 MG tablet Take 10 mg by mouth daily.  . methimazole (TAPAZOLE) 5 MG tablet Take 2.5 mg by mouth daily.  . NON  FORMULARY Diet- Liquids: Nectar Thick Liquids  Diet:Dysphagia 1 Puree  . OXYGEN Inhale 2 L into the lungs continuous. Every shift to maintain satuation >90%.  Marland Kitchen sennosides (SENOKOT) 8.8 MG/5ML syrup Take 5 mLs by mouth 2 (two) times daily as needed for mild constipation.  . Vitamins A & D (VITAMIN A & D) ointment Apply 1 application topically as needed for dry skin. To left heel qshift for prevention.   No facility-administered encounter medications on file as of 05/05/2020.     SIGNIFICANT DIAGNOSTIC EXAMS   PREVIOUS;  01-16-19: chest x-ray: Infiltrate versus atelectasis at LEFT base   05-05-19: chest x-ray: 1. Normal chest x-ray.  2. No tuberculosis is noted   05-08-19: KUB: Findings suggest constipation, fecal impaction or decreased colonic motility. Correlate with risk factors for constipation or fecal stasis, based on frequency or absence of bowel movements. Overall sensitivity is decreased, due to attenuation from patient body habitus.  Soft tissue attenuation results in significantly diminished diagnostic sensitivity and utility.  05-09-19: chest x-ray: Mild bibasilar subsegmental atelectasis or infiltrates are noted   05-09-19: ct of abdomen and pelvis:  1. Moderately large amount of stool in the distal sigmoid colon and rectum. Moderate gaseous distension of the more proximal sigmoid colon without evidence of volvulus or bowel obstruction. 2. Bibasilar lung consolidation concerning for pneumonia. 3. Aortic Atherosclerosis   07-12-18: chest x-ray: Cardiomegaly with low lung volumes and bibasilar collapse/consolidation, left greater than right.  05-16-19: chest x-ray: ETT in good position. Worsened bilateral airspace disease small pleural effusions.  05-19-19: chest x-ray: Partial but incomplete clearing of opacity from the basis. Patchy airspace opacity remains in the lower lung regions with consolidation in a portion of the left base. There are small pleural effusions  bilaterally. Upper lung regions appear clear. Heart upper normal in size. No adenopathy demonstrable by radiography.  NO NEW EXAMS     LABS REVIEWED: PREVIOUS   05-01-19: PSA 11.82 05-05-19: wbc 12.3; hgb 16.7; hct 53.4; mcv 98.2 plt 246; blood culture: no growth 05-08-19: PSA 16.39 05-09-19: wbc 13.3; hgb 16.9; hct 56.5; mcv 102.7 plt 261; glucose 689; bun 82; creat 2.78; k+ 5.3; na++ 151; ca 8.9; urine culture: enterococcus faecalis:  Hgb a1c 11.3 05-11-19: glucose 98; bun 36; creat 1.61; k+ 3.8; na++ 158; ca 7.8; phos 2.0 albumin 2.6 05-15-19: wbc 8.2; hgb 12.9; hct 42.2; mcv 100.2 plt 165; glucose 178; bun 15; creat 1.37; k+ 3.5; na++ 143; ca 7.5  05-18-19: wbc 7.8; hgb 13.3; hct 43.2; mcv 99.1 plt 227; glucose 246; bun 28; creat 1.21; k+ 3.2; na++ 139 ca 7.7 05-20-19: wbc 9.5; hgb 13.3; hct 44.0; mcv 98.9 plt 264; glucose 190; bun 25; creat 1.10; k+ 3.7; na++ 138; ca 8.3 ;liver normal albumin 2.4  07-04-19 glucose 99; bun 31; creat 1.19 ;k+ 3.8; na++ 143; ca 8.8 chol 109; ldl 68 trig 57; hdl 30; urine micro-albumin 33.8 07-05-19: tsh 1.257 free T4: 0.66  10-11-19: hgb a1c 7.2   11-05-19: tsh 2.340 01-31-20: wbc 8.3; hgb 13.2; hct 43.7; mcv 100.9 plt 271; glucose 122; bun 24; creat 1.10 ;k+ 4.0; na++ 141; ca 8.8 liver norm albumin 3.1 hgb a1c 7.9; chol 138; ldl 93; trig 57; hdl 34   NO NEW LABS.   Review of Systems  Unable to perform ROS: Dementia (unable to participate )    Physical Exam Constitutional:      General: He is not in acute distress.    Appearance: He is well-developed. He is obese. He is not diaphoretic.  Neck:     Thyroid: No thyromegaly.  Cardiovascular:     Rate and Rhythm: Normal rate and regular rhythm.     Pulses: Normal pulses.     Heart sounds: Normal heart sounds.  Pulmonary:     Effort: Pulmonary effort is normal. No respiratory distress.     Breath sounds: Normal breath sounds.  Abdominal:     General: Bowel sounds are normal. There is no distension.       Palpations: Abdomen is soft.  Tenderness: There is no abdominal tenderness.  Musculoskeletal:     Cervical back: Neck supple.     Right lower leg: No edema.     Left lower leg: No edema.     Comments: Right hemiplegia   Lymphadenopathy:     Cervical: No cervical adenopathy.  Skin:    General: Skin is warm and dry.  Neurological:     Mental Status: He is alert. Mental status is at baseline.  Psychiatric:        Mood and Affect: Mood normal.     ASSESSMENT/ PLAN:   TODAY:  1. Aortic atherosclerosis: is stable will monitor is on long term eliquis; lipitor; and blood pressure medications.   2. Simple bronchitis: is stable is 02 dependent; will continue symbicort 80/4.5 mcg 2 puffs twice daily claritin 10 mg daily incruse elipta 62.5 mcg 1 puff daily   3. 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   PREVIOUS    4. Hypertension associated with type 2 diabetes mellitis: is stable b/p 126/61 will continue labetolol 200 mg daily and lisinopril 2.5 mg daily   5. Cerebrovascular accident (CVA) due to other mechanism/hemiplegia or right dominant side due to cerebrovascular disease: is stable will continue eliquis 5 mg twice daily   6. Hyperlipidemia associated with type 2 diabetes mellitus is stable LDL 93 will continue lipitor 10 mg daily   7. Hyperthyroidism: is stable tsh 2.340 free t4: 0.66 will continue tapazole 2.5 mg daily    8. Sigmoid volvulus: is stable will continue senna twice daily as needed  9. Deep vein thrombosis (DVT) of left lower extremity unspecified vein/ other pulmonary embolism without acute cor pulmonale is stable is on long term eliquis 5 mg twice daily   10. 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 Elevated PSA; is without change will monitor   12. Dementia without behavioral disturbance unspecified dementia type: is without change will monitor     Will check hgb a1c psa     MD is aware of resident's narcotic use and is in agreement with current plan of care. We will attempt to wean resident as appropriate.  Synthia Innocent NP [facility]  Contact [phone] Monday through Friday 8am- 5pm  After hours call [phone]

## 2020-05-06 DIAGNOSIS — I700 Atherosclerosis of aorta: Secondary | ICD-10-CM | POA: Insufficient documentation

## 2020-05-08 ENCOUNTER — Other Ambulatory Visit (HOSPITAL_COMMUNITY)
Admission: RE | Admit: 2020-05-08 | Discharge: 2020-05-08 | Disposition: A | Discharge status: Home / Self Care | Payer: Medicare Other | Source: Skilled Nursing Facility | Attending: Adult Health | Admitting: Adult Health

## 2020-05-08 DIAGNOSIS — A419 Sepsis, unspecified organism: Secondary | ICD-10-CM | POA: Diagnosis not present

## 2020-05-08 DIAGNOSIS — K5730 Diverticulosis of large intestine without perforation or abscess without bleeding: Secondary | ICD-10-CM | POA: Diagnosis not present

## 2020-05-08 DIAGNOSIS — I2699 Other pulmonary embolism without acute cor pulmonale: Secondary | ICD-10-CM | POA: Diagnosis not present

## 2020-05-08 DIAGNOSIS — R0902 Hypoxemia: Secondary | ICD-10-CM | POA: Diagnosis not present

## 2020-05-08 DIAGNOSIS — R918 Other nonspecific abnormal finding of lung field: Secondary | ICD-10-CM | POA: Diagnosis not present

## 2020-05-08 DIAGNOSIS — K8020 Calculus of gallbladder without cholecystitis without obstruction: Secondary | ICD-10-CM | POA: Diagnosis not present

## 2020-05-08 DIAGNOSIS — E1165 Type 2 diabetes mellitus with hyperglycemia: Secondary | ICD-10-CM | POA: Insufficient documentation

## 2020-05-08 DIAGNOSIS — R0602 Shortness of breath: Secondary | ICD-10-CM | POA: Diagnosis not present

## 2020-05-08 DIAGNOSIS — I69351 Hemiplegia and hemiparesis following cerebral infarction affecting right dominant side: Secondary | ICD-10-CM | POA: Diagnosis not present

## 2020-05-08 DIAGNOSIS — J90 Pleural effusion, not elsewhere classified: Secondary | ICD-10-CM | POA: Diagnosis not present

## 2020-05-08 DIAGNOSIS — R000 Tachycardia, unspecified: Secondary | ICD-10-CM | POA: Diagnosis not present

## 2020-05-08 DIAGNOSIS — Z20822 Contact with and (suspected) exposure to covid-19: Secondary | ICD-10-CM | POA: Diagnosis not present

## 2020-05-08 DIAGNOSIS — I517 Cardiomegaly: Secondary | ICD-10-CM | POA: Diagnosis not present

## 2020-05-08 DIAGNOSIS — J9811 Atelectasis: Secondary | ICD-10-CM | POA: Diagnosis not present

## 2020-05-08 DIAGNOSIS — K5641 Fecal impaction: Secondary | ICD-10-CM | POA: Diagnosis not present

## 2020-05-08 DIAGNOSIS — Z66 Do not resuscitate: Secondary | ICD-10-CM | POA: Diagnosis not present

## 2020-05-08 DIAGNOSIS — N289 Disorder of kidney and ureter, unspecified: Secondary | ICD-10-CM | POA: Diagnosis not present

## 2020-05-08 DIAGNOSIS — J690 Pneumonitis due to inhalation of food and vomit: Secondary | ICD-10-CM | POA: Diagnosis not present

## 2020-05-08 DIAGNOSIS — J9621 Acute and chronic respiratory failure with hypoxia: Secondary | ICD-10-CM | POA: Diagnosis not present

## 2020-05-08 LAB — PSA: Prostatic Specific Antigen: 10.21 ng/mL — ABNORMAL HIGH (ref 0.00–4.00)

## 2020-05-08 LAB — HEMOGLOBIN A1C
Hgb A1c MFr Bld: 7.9 % — ABNORMAL HIGH (ref 4.8–5.6)
Mean Plasma Glucose: 180.03 mg/dL

## 2020-05-09 ENCOUNTER — Emergency Department (HOSPITAL_COMMUNITY)
Admission: EM | Admit: 2020-05-09 | Discharge: 2020-05-10 | Disposition: A | Discharge status: Home / Self Care | Payer: Medicare Other | Source: Home / Self Care | Attending: Emergency Medicine | Admitting: Emergency Medicine

## 2020-05-09 ENCOUNTER — Other Ambulatory Visit: Payer: Self-pay

## 2020-05-09 DIAGNOSIS — Z794 Long term (current) use of insulin: Secondary | ICD-10-CM | POA: Insufficient documentation

## 2020-05-09 DIAGNOSIS — N289 Disorder of kidney and ureter, unspecified: Secondary | ICD-10-CM

## 2020-05-09 DIAGNOSIS — Z20822 Contact with and (suspected) exposure to covid-19: Secondary | ICD-10-CM | POA: Insufficient documentation

## 2020-05-09 DIAGNOSIS — E1169 Type 2 diabetes mellitus with other specified complication: Secondary | ICD-10-CM | POA: Insufficient documentation

## 2020-05-09 DIAGNOSIS — Z7901 Long term (current) use of anticoagulants: Secondary | ICD-10-CM | POA: Insufficient documentation

## 2020-05-09 DIAGNOSIS — J690 Pneumonitis due to inhalation of food and vomit: Secondary | ICD-10-CM | POA: Diagnosis not present

## 2020-05-09 DIAGNOSIS — J449 Chronic obstructive pulmonary disease, unspecified: Secondary | ICD-10-CM | POA: Insufficient documentation

## 2020-05-09 DIAGNOSIS — Z79899 Other long term (current) drug therapy: Secondary | ICD-10-CM | POA: Insufficient documentation

## 2020-05-09 DIAGNOSIS — R0902 Hypoxemia: Secondary | ICD-10-CM | POA: Insufficient documentation

## 2020-05-09 DIAGNOSIS — R000 Tachycardia, unspecified: Secondary | ICD-10-CM | POA: Insufficient documentation

## 2020-05-09 NOTE — ED Triage Notes (Addendum)
Pt here from [facility]. Pt SOB . Usually on 2l [place] but now on 3l.  Tried nebs but sats were 80s. Pt DNR.  Hx of aspiration, used to have a feeding tube but it has now been removed and is on nectar thick liquids despite md requests for him to be NPO .  Per nursing abd distention is normal for him.   [id number] nursing extension

## 2020-05-10 ENCOUNTER — Inpatient Hospital Stay (HOSPITAL_COMMUNITY)
Admission: EM | Admit: 2020-05-10 | Discharge: 2020-05-22 | DRG: 871 | Disposition: A | Discharge status: Skilled Nursing Facility | Payer: Medicare Other | Source: Skilled Nursing Facility | Attending: Internal Medicine | Admitting: Internal Medicine

## 2020-05-10 ENCOUNTER — Emergency Department (HOSPITAL_COMMUNITY): Payer: Medicare Other

## 2020-05-10 ENCOUNTER — Other Ambulatory Visit: Payer: Self-pay

## 2020-05-10 ENCOUNTER — Encounter (HOSPITAL_COMMUNITY): Payer: Self-pay

## 2020-05-10 DIAGNOSIS — R918 Other nonspecific abnormal finding of lung field: Secondary | ICD-10-CM | POA: Diagnosis not present

## 2020-05-10 DIAGNOSIS — I82409 Acute embolism and thrombosis of unspecified deep veins of unspecified lower extremity: Secondary | ICD-10-CM | POA: Diagnosis present

## 2020-05-10 DIAGNOSIS — I69391 Dysphagia following cerebral infarction: Secondary | ICD-10-CM

## 2020-05-10 DIAGNOSIS — E1169 Type 2 diabetes mellitus with other specified complication: Secondary | ICD-10-CM | POA: Diagnosis present

## 2020-05-10 DIAGNOSIS — A419 Sepsis, unspecified organism: Principal | ICD-10-CM

## 2020-05-10 DIAGNOSIS — E870 Hyperosmolality and hypernatremia: Secondary | ICD-10-CM | POA: Diagnosis present

## 2020-05-10 DIAGNOSIS — R140 Abdominal distension (gaseous): Secondary | ICD-10-CM

## 2020-05-10 DIAGNOSIS — R1310 Dysphagia, unspecified: Secondary | ICD-10-CM | POA: Diagnosis present

## 2020-05-10 DIAGNOSIS — Z66 Do not resuscitate: Secondary | ICD-10-CM | POA: Diagnosis present

## 2020-05-10 DIAGNOSIS — J9622 Acute and chronic respiratory failure with hypercapnia: Secondary | ICD-10-CM

## 2020-05-10 DIAGNOSIS — E1165 Type 2 diabetes mellitus with hyperglycemia: Secondary | ICD-10-CM

## 2020-05-10 DIAGNOSIS — I517 Cardiomegaly: Secondary | ICD-10-CM | POA: Diagnosis not present

## 2020-05-10 DIAGNOSIS — E785 Hyperlipidemia, unspecified: Secondary | ICD-10-CM | POA: Diagnosis present

## 2020-05-10 DIAGNOSIS — E872 Acidosis, unspecified: Secondary | ICD-10-CM

## 2020-05-10 DIAGNOSIS — J9621 Acute and chronic respiratory failure with hypoxia: Secondary | ICD-10-CM | POA: Diagnosis present

## 2020-05-10 DIAGNOSIS — Z20822 Contact with and (suspected) exposure to covid-19: Secondary | ICD-10-CM | POA: Diagnosis present

## 2020-05-10 DIAGNOSIS — D72829 Elevated white blood cell count, unspecified: Secondary | ICD-10-CM

## 2020-05-10 DIAGNOSIS — K8020 Calculus of gallbladder without cholecystitis without obstruction: Secondary | ICD-10-CM | POA: Diagnosis not present

## 2020-05-10 DIAGNOSIS — I639 Cerebral infarction, unspecified: Secondary | ICD-10-CM | POA: Diagnosis present

## 2020-05-10 DIAGNOSIS — K5641 Fecal impaction: Secondary | ICD-10-CM

## 2020-05-10 DIAGNOSIS — Z79899 Other long term (current) drug therapy: Secondary | ICD-10-CM

## 2020-05-10 DIAGNOSIS — R6520 Severe sepsis without septic shock: Secondary | ICD-10-CM | POA: Diagnosis present

## 2020-05-10 DIAGNOSIS — I69351 Hemiplegia and hemiparesis following cerebral infarction affecting right dominant side: Secondary | ICD-10-CM

## 2020-05-10 DIAGNOSIS — K5939 Other megacolon: Secondary | ICD-10-CM

## 2020-05-10 DIAGNOSIS — J90 Pleural effusion, not elsewhere classified: Secondary | ICD-10-CM | POA: Diagnosis not present

## 2020-05-10 DIAGNOSIS — K567 Ileus, unspecified: Secondary | ICD-10-CM

## 2020-05-10 DIAGNOSIS — K760 Fatty (change of) liver, not elsewhere classified: Secondary | ICD-10-CM | POA: Diagnosis present

## 2020-05-10 DIAGNOSIS — Z515 Encounter for palliative care: Secondary | ICD-10-CM

## 2020-05-10 DIAGNOSIS — I2699 Other pulmonary embolism without acute cor pulmonale: Secondary | ICD-10-CM | POA: Diagnosis not present

## 2020-05-10 DIAGNOSIS — K5900 Constipation, unspecified: Secondary | ICD-10-CM | POA: Diagnosis present

## 2020-05-10 DIAGNOSIS — J690 Pneumonitis due to inhalation of food and vomit: Secondary | ICD-10-CM | POA: Diagnosis present

## 2020-05-10 DIAGNOSIS — E0590 Thyrotoxicosis, unspecified without thyrotoxic crisis or storm: Secondary | ICD-10-CM | POA: Diagnosis present

## 2020-05-10 DIAGNOSIS — Z86718 Personal history of other venous thrombosis and embolism: Secondary | ICD-10-CM

## 2020-05-10 DIAGNOSIS — R0902 Hypoxemia: Secondary | ICD-10-CM | POA: Diagnosis not present

## 2020-05-10 DIAGNOSIS — I2782 Chronic pulmonary embolism: Secondary | ICD-10-CM | POA: Diagnosis present

## 2020-05-10 DIAGNOSIS — Z7901 Long term (current) use of anticoagulants: Secondary | ICD-10-CM

## 2020-05-10 DIAGNOSIS — E1159 Type 2 diabetes mellitus with other circulatory complications: Secondary | ICD-10-CM | POA: Diagnosis present

## 2020-05-10 DIAGNOSIS — Z7189 Other specified counseling: Secondary | ICD-10-CM

## 2020-05-10 DIAGNOSIS — R000 Tachycardia, unspecified: Secondary | ICD-10-CM | POA: Diagnosis not present

## 2020-05-10 DIAGNOSIS — K5730 Diverticulosis of large intestine without perforation or abscess without bleeding: Secondary | ICD-10-CM | POA: Diagnosis not present

## 2020-05-10 DIAGNOSIS — K219 Gastro-esophageal reflux disease without esophagitis: Secondary | ICD-10-CM | POA: Diagnosis present

## 2020-05-10 DIAGNOSIS — R471 Dysarthria and anarthria: Secondary | ICD-10-CM | POA: Diagnosis present

## 2020-05-10 DIAGNOSIS — R0602 Shortness of breath: Secondary | ICD-10-CM | POA: Diagnosis not present

## 2020-05-10 DIAGNOSIS — K56609 Unspecified intestinal obstruction, unspecified as to partial versus complete obstruction: Secondary | ICD-10-CM

## 2020-05-10 DIAGNOSIS — Z9981 Dependence on supplemental oxygen: Secondary | ICD-10-CM

## 2020-05-10 DIAGNOSIS — J9811 Atelectasis: Secondary | ICD-10-CM | POA: Diagnosis not present

## 2020-05-10 DIAGNOSIS — I10 Essential (primary) hypertension: Secondary | ICD-10-CM | POA: Diagnosis present

## 2020-05-10 DIAGNOSIS — N179 Acute kidney failure, unspecified: Secondary | ICD-10-CM | POA: Diagnosis present

## 2020-05-10 DIAGNOSIS — Z880 Allergy status to penicillin: Secondary | ICD-10-CM

## 2020-05-10 DIAGNOSIS — Z794 Long term (current) use of insulin: Secondary | ICD-10-CM

## 2020-05-10 LAB — RESP PANEL BY RT-PCR (FLU A&B, COVID) ARPGX2
Influenza A by PCR: NEGATIVE
Influenza B by PCR: NEGATIVE
SARS Coronavirus 2 by RT PCR: NEGATIVE

## 2020-05-10 LAB — COMPREHENSIVE METABOLIC PANEL
ALT: 18 U/L (ref 0–44)
AST: 19 U/L (ref 15–41)
Albumin: 3.2 g/dL — ABNORMAL LOW (ref 3.5–5.0)
Alkaline Phosphatase: 51 U/L (ref 38–126)
Anion gap: 6 (ref 5–15)
BUN: 36 mg/dL — ABNORMAL HIGH (ref 8–23)
CO2: 38 mmol/L — ABNORMAL HIGH (ref 22–32)
Calcium: 8.7 mg/dL — ABNORMAL LOW (ref 8.9–10.3)
Chloride: 101 mmol/L (ref 98–111)
Creatinine, Ser: 1.45 mg/dL — ABNORMAL HIGH (ref 0.61–1.24)
GFR, Estimated: 51 mL/min — ABNORMAL LOW (ref >60)
Glucose, Bld: 294 mg/dL — ABNORMAL HIGH (ref 70–99)
Potassium: 4.8 mmol/L (ref 3.5–5.1)
Sodium: 145 mmol/L (ref 135–145)
Total Bilirubin: 0.3 mg/dL (ref 0.3–1.2)
Total Protein: 7.7 g/dL (ref 6.5–8.1)

## 2020-05-10 LAB — URINALYSIS, ROUTINE W REFLEX MICROSCOPIC
Bilirubin Urine: NEGATIVE
Glucose, UA: NEGATIVE mg/dL
Ketones, ur: NEGATIVE mg/dL
Leukocytes,Ua: NEGATIVE
Nitrite: NEGATIVE
Protein, ur: 30 mg/dL — AB
Specific Gravity, Urine: 1.032 — ABNORMAL HIGH (ref 1.005–1.030)
pH: 5 (ref 5.0–8.0)

## 2020-05-10 LAB — CBC WITH DIFFERENTIAL/PLATELET
Abs Immature Granulocytes: 0.02 10*3/uL (ref 0.00–0.07)
Abs Immature Granulocytes: 0.14 10*3/uL — ABNORMAL HIGH (ref 0.00–0.07)
Basophils Absolute: 0 10*3/uL (ref 0.0–0.1)
Basophils Absolute: 0.1 10*3/uL (ref 0.0–0.1)
Basophils Relative: 0 %
Basophils Relative: 0 %
Eosinophils Absolute: 0.3 10*3/uL (ref 0.0–0.5)
Eosinophils Absolute: 0 10*3/uL (ref 0.0–0.5)
Eosinophils Relative: 3 %
Eosinophils Relative: 0 %
HCT: 43.6 % (ref 39.0–52.0)
HCT: 44.8 % (ref 39.0–52.0)
Hemoglobin: 12.4 g/dL — ABNORMAL LOW (ref 13.0–17.0)
Hemoglobin: 12.4 g/dL — ABNORMAL LOW (ref 13.0–17.0)
Immature Granulocytes: 0 %
Immature Granulocytes: 1 %
Lymphocytes Relative: 23 %
Lymphocytes Relative: 2 %
Lymphs Abs: 2.2 10*3/uL (ref 0.7–4.0)
Lymphs Abs: 0.4 10*3/uL — ABNORMAL LOW (ref 0.7–4.0)
MCH: 30.2 pg (ref 26.0–34.0)
MCH: 30.5 pg (ref 26.0–34.0)
MCHC: 28.4 g/dL — ABNORMAL LOW (ref 30.0–36.0)
MCHC: 27.7 g/dL — ABNORMAL LOW (ref 30.0–36.0)
MCV: 106.3 fL — ABNORMAL HIGH (ref 80.0–100.0)
MCV: 110.1 fL — ABNORMAL HIGH (ref 80.0–100.0)
Monocytes Absolute: 0.8 10*3/uL (ref 0.1–1.0)
Monocytes Absolute: 0.6 10*3/uL (ref 0.1–1.0)
Monocytes Relative: 8 %
Monocytes Relative: 3 %
Neutro Abs: 6.3 10*3/uL (ref 1.7–7.7)
Neutro Abs: 17.5 10*3/uL — ABNORMAL HIGH (ref 1.7–7.7)
Neutrophils Relative %: 66 %
Neutrophils Relative %: 94 %
Platelets: 235 10*3/uL (ref 150–400)
Platelets: 229 10*3/uL (ref 150–400)
RBC: 4.1 MIL/uL — ABNORMAL LOW (ref 4.22–5.81)
RBC: 4.07 MIL/uL — ABNORMAL LOW (ref 4.22–5.81)
RDW: 13.7 % (ref 11.5–15.5)
RDW: 14 % (ref 11.5–15.5)
WBC: 9.6 10*3/uL (ref 4.0–10.5)
WBC: 18.8 10*3/uL — ABNORMAL HIGH (ref 4.0–10.5)
nRBC: 0 % (ref 0.0–0.2)
nRBC: 0 % (ref 0.0–0.2)

## 2020-05-10 LAB — APTT: aPTT: 29 seconds (ref 24–36)

## 2020-05-10 LAB — BRAIN NATRIURETIC PEPTIDE: B Natriuretic Peptide: 40 pg/mL (ref 0.0–100.0)

## 2020-05-10 LAB — LACTIC ACID, PLASMA
Lactic Acid, Venous: 2.6 mmol/L (ref 0.5–1.9)
Lactic Acid, Venous: 2.2 mmol/L (ref 0.5–1.9)

## 2020-05-10 LAB — BASIC METABOLIC PANEL
Anion gap: 7 (ref 5–15)
BUN: 41 mg/dL — ABNORMAL HIGH (ref 8–23)
CO2: 38 mmol/L — ABNORMAL HIGH (ref 22–32)
Calcium: 8.5 mg/dL — ABNORMAL LOW (ref 8.9–10.3)
Chloride: 102 mmol/L (ref 98–111)
Creatinine, Ser: 1.65 mg/dL — ABNORMAL HIGH (ref 0.61–1.24)
GFR, Estimated: 43 mL/min — ABNORMAL LOW (ref >60)
Glucose, Bld: 246 mg/dL — ABNORMAL HIGH (ref 70–99)
Potassium: 4.4 mmol/L (ref 3.5–5.1)
Sodium: 147 mmol/L — ABNORMAL HIGH (ref 135–145)

## 2020-05-10 LAB — PROTIME-INR
INR: 1.3 — ABNORMAL HIGH (ref 0.8–1.2)
Prothrombin Time: 15.9 seconds — ABNORMAL HIGH (ref 11.4–15.2)

## 2020-05-10 MED ORDER — ALBUTEROL SULFATE (2.5 MG/3ML) 0.083% IN NEBU
INHALATION_SOLUTION | RESPIRATORY_TRACT | Status: AC
  Administered 2020-05-10: 5 mg
  Filled 2020-05-10: qty 6

## 2020-05-10 MED ORDER — SODIUM CHLORIDE 0.9 % IV SOLN
2.0000 g | Freq: Once | INTRAVENOUS | Status: AC
  Administered 2020-05-10: 2 g via INTRAVENOUS
  Filled 2020-05-10: qty 2

## 2020-05-10 MED ORDER — SODIUM CHLORIDE 0.9 % IV BOLUS
500.0000 mL | Freq: Once | INTRAVENOUS | Status: AC
  Administered 2020-05-10: 500 mL via INTRAVENOUS

## 2020-05-10 MED ORDER — CLINDAMYCIN PHOSPHATE 900 MG/50ML IV SOLN
900.0000 mg | Freq: Once | INTRAVENOUS | Status: AC
  Administered 2020-05-10: 900 mg via INTRAVENOUS
  Filled 2020-05-10: qty 50

## 2020-05-10 MED ORDER — VANCOMYCIN HCL 2000 MG/400ML IV SOLN
2000.0000 mg | Freq: Once | INTRAVENOUS | Status: AC
  Administered 2020-05-10: 2000 mg via INTRAVENOUS
  Filled 2020-05-10: qty 400

## 2020-05-10 MED ORDER — ACETAMINOPHEN 650 MG RE SUPP
650.0000 mg | Freq: Once | RECTAL | Status: AC
  Administered 2020-05-10: 650 mg via RECTAL
  Filled 2020-05-10: qty 1

## 2020-05-10 MED ORDER — IPRATROPIUM BROMIDE 0.02 % IN SOLN
RESPIRATORY_TRACT | Status: AC
  Administered 2020-05-10: 0.5 mg
  Filled 2020-05-10: qty 2.5

## 2020-05-10 MED ORDER — DEXTROSE 5 % IV SOLN: 600.0000 mg | Freq: Three times a day (TID) | INTRAVENOUS | 0 refills | Status: DC

## 2020-05-10 MED ORDER — VANCOMYCIN HCL IN DEXTROSE 1-5 GM/200ML-% IV SOLN: 1000.0000 mg | Freq: Once | INTRAVENOUS | Status: DC

## 2020-05-10 MED ORDER — SODIUM CHLORIDE 0.9 % IV SOLN: 2.0000 g | Freq: Once | INTRAVENOUS | Status: DC

## 2020-05-10 MED ORDER — IOHEXOL 350 MG/ML SOLN
100.0000 mL | Freq: Once | INTRAVENOUS | Status: AC | PRN
  Administered 2020-05-10: 100 mL via INTRAVENOUS

## 2020-05-10 MED ORDER — IOHEXOL 300 MG/ML  SOLN
100.0000 mL | Freq: Once | INTRAMUSCULAR | Status: AC | PRN
  Administered 2020-05-10: 75 mL via INTRAVENOUS

## 2020-05-10 MED ORDER — LACTATED RINGERS IV SOLN: INTRAVENOUS | Status: DC

## 2020-05-10 NOTE — Discharge Instructions (Addendum)
The blood work and the CT scan reveals that there is no obvious new pneumonia, though there is evidence of a prior pneumonia. You will need to have your CT scan or chest x-ray rechecked within 2 weeks to make sure things are getting better. In the meantime please continue to take clindamycin every 8 hours for the next 7 days, this will treat potential infection in the lungs, if you are doing better the physician or nurse practitioner taking care of you at the facility can change this over to a liquid form that you can take by mouth. Please have your practitioner see you within 48 hours, return to the ER for severe or worsening symptoms

## 2020-05-10 NOTE — ED Provider Notes (Signed)
Care assumed at change of shift, this patient has had a prior stroke with dysphagia, on thickened liquids, concern for aspiration, slight increase in oxygen requirement.  Coming from [facility].  The patient was given clindamycin for possible aspiration, CT scan does not reveal any specific abnormalities.  The metabolic panel shows a slight increase in creatinine, IV fluids given.  There is no leukocytosis, no significant anemia, the BNP is normal, Covid is negative as is the flu.  No urinary symptoms, will continue Clinda treatment for course of 7 days for possible aspiration pneumonia  Facility can do IV meds  Final diagnoses:  Hypoxia  Aspiration pneumonia due to gastric secretions, unspecified laterality, unspecified part of lung (HCC)  Renal insufficiency      Eber Hong, MD 05/10/20 1002

## 2020-05-10 NOTE — ED Notes (Signed)
[name] Eulah Pont informed of pt returning back to facility on IV antibiotics.  Pt to return to facilityh with IV in place.

## 2020-05-10 NOTE — ED Notes (Signed)
EKG done and seen by Dr Knapp 

## 2020-05-10 NOTE — Progress Notes (Signed)
elink following for Sepsis protocol

## 2020-05-10 NOTE — ED Triage Notes (Signed)
Pt sent from [facility] for positive blood cultures. Pt is on continuous oxygen. Pt was seen here earlier today and discharged back to facility, received call from lab with positive cultures and pt was sent back for evaluation and treatment.

## 2020-05-10 NOTE — ED Notes (Signed)
Facility to pick up pt.

## 2020-05-10 NOTE — ED Provider Notes (Signed)
[facility] EMERGENCY DEPARTMENT Provider Note   CSN: [id number] Arrival date & time: 05/09/20  2348   Time seen 12:52 AM  History Chief Complaint  Patient presents with  . Shortness of Breath   Level 5 caveat for dysarthria  [name] [name] is a 74 y.o. male.  HPI Patient presents from his nursing facility because his oxygen dropped tonight.  He is chronically on 2 L/min nasal cannula oxygen.  They had to increase it up to 3 L.  They report they gave him a nebulizer but his sats were still in the 80s percent.  Patient has a history of aspiration and use to have a feeding tube however family wanted him to be able to drink so he is now on nectar thick liquids.  When I asked patient he denies feeling short of breath or having chest pain but I am not sure if he is really aware of what is going on.  PCP Sharee Holster, NP   Patient is DO NOT RESUSCITATE  Past Medical History:  Diagnosis Date  . DVT (deep venous thrombosis) (HCC) 09/26/2012  . Dysphasia   . Fatty liver   . Hemiplegia (HCC)   . Hypertension   . Pneumonia   . Pulmonary embolism (HCC)   . 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(HCC)   . Stroke [facility])     Patient Active Problem List   Diagnosis Date Noted  . Aortic atherosclerosis (HCC) 05/06/2020  . Hypertension associated with type 2 diabetes mellitus (HCC) 06/26/2019  . GERD (gastroesophageal reflux disease) 06/06/2019  . Increased oropharyngeal secretions   . 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 Elevated PSA, between 10 and less than 20 ng/ml 02/08/2019  . 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 Dysphagia due to old cerebrovascular accident-on honey thickened liquids 08/25/2018  . Hyperlipidemia associated with type 2 diabetes mellitus (HCC) 08/25/2018  . Hyperthyroidism 04/05/2017  . 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 COPD (chronic obstructive pulmonary disease) (HCC) 10/19/2016  . 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 Long term current use of anticoagulant therapy-on Eliquis due to history of DVT/PE and strokes 10/21/2012  . DVT (deep venous thrombosis) (HCC) 09/26/2012  . Chronic pulmonary embolism (HCC) 09/26/2012  . Constipation 09/26/2012    Past Surgical History:  Procedure Laterality Date  . ESOPHAGOGASTRODUODENOSCOPY (EGD) WITH PROPOFOL N/A 05/15/2019   Procedure: ESOPHAGOGASTRODUODENOSCOPY (EGD) WITH PROPOFOL;  Surgeon: Lucretia Roers, MD;  Location: [facility];  Service: General;  Laterality: N/A;  . FLEXIBLE SIGMOIDOSCOPY N/A 10/21/2016   Procedure: FLEXIBLE SIGMOIDOSCOPY;  Surgeon: Malissa Hippo, MD;  Location: [facility] SUITE;  Service: Endoscopy;  Laterality: N/A;  . FLEXIBLE SIGMOIDOSCOPY N/A 10/25/2016   Procedure: FLEXIBLE SIGMOIDOSCOPY;  Surgeon: Malissa Hippo, MD;  Location: [facility];  Service: Endoscopy;  Laterality: N/A;  . PEG PLACEMENT N/A 05/15/2019   Procedure: PERCUTANEOUS ENDOSCOPIC GASTROSTOMY (PEG) PLACEMENT;  Surgeon: Lucretia Roers, MD;  Location: [facility];  Service: General;  Laterality: N/A;  . unable         No family history on file.  Social History   Tobacco Use  . Smoking status: Never Smoker  . Smokeless tobacco: Never Used  Vaping Use  . Vaping Use: Never used  Substance Use Topics  . Alcohol use: No  Alcohol/week: 0.0 standard drinks  . Drug use: No    Home Medications Prior to Admission medications   Medication Sig Start Date End Date Taking? Authorizing Provider  acetaminophen (TYLENOL) 325 MG tablet Take 650 mg by mouth every 6 (six) hours as needed for moderate pain.     [provider]  apixaban (ELIQUIS) 5 MG TABS tablet Place 5 mg into feeding tube 2 (two) times daily. For dvt/pe/cva po    [provider]   atorvastatin (LIPITOR) 10 MG tablet Take 10 mg by mouth daily.    [provider]  atropine 1 % ophthalmic solution Place 1 drop under the tongue every 6 (six) hours as needed.    [provider]  bisacodyl (DULCOLAX) 10 MG suppository Place 1 suppository (10 mg total) rectally at bedtime. 05/23/19   Sherryll BurgerShah, Pratik D, DO  carboxymethylcellulose (REFRESH PLUS) 0.5 % SOLN Place 1 drop into the right eye in the morning, at noon, in the evening, and at bedtime.     [provider]  erythromycin ophthalmic ointment Place 1 application into the right eye 2 (two) times daily. Also apply to right eye - dx corneal scar - continue until next ophthalmology visit At Bedtime 04/26/20   [provider]  insulin aspart (NOVOLOG FLEXPEN) 100 UNIT/ML FlexPen Inject 15 Units into the skin 2 (two) times daily with a meal. With Lunch and Supper 04/07/20   [provider]  Insulin Glargine (BASAGLAR KWIKPEN) 100 UNIT/ML SOPN Inject 31 Units into the skin at bedtime.  04/07/20   [provider]  ipratropium-albuterol (DUONEB) 0.5-2.5 (3) MG/3ML SOLN Take 3 mLs by nebulization every 6 (six) hours.  10/02/19   [provider]  labetalol (NORMODYNE) 200 MG tablet Take 200 mg by mouth daily. For HTN    [provider]  lactulose (CHRONULAC) 10 GM/15ML solution Give 30 cc by mouth twice a day 11/13/19   [provider]  lisinopril (ZESTRIL) 2.5 MG tablet Take 2.5 mg by mouth daily. hold for systolic < 110    [provider]  loratadine (CLARITIN) 10 MG tablet Take 10 mg by mouth daily.    [provider]  methimazole (TAPAZOLE) 5 MG tablet Take 2.5 mg by mouth daily. 11/28/18   [provider]  NON FORMULARY Diet- Liquids: Nectar Thick Liquids  Diet:Dysphagia 1 Puree    [provider]  OXYGEN Inhale 2 L into the lungs continuous. Every shift to maintain satuation >90%.    [provider]  sennosides  (SENOKOT) 8.8 MG/5ML syrup Take 5 mLs by mouth 2 (two) times daily as needed for mild constipation. 06/11/19   [provider]  Vitamins A & D (VITAMIN A & D) ointment Apply 1 application topically as needed for dry skin. To left heel qshift for prevention. 11/08/19   [provider]    Allergies    Penicillin g and Penicillins  Review of Systems   Review of Systems  Unable to perform ROS: Other    Physical Exam Updated Vital Signs BP 120/80   Pulse (!) 109   Temp (!) 97.5 F (36.4 C) (Oral)   Resp 16   SpO2 98%   Physical Exam Vitals and nursing note reviewed.  Constitutional:      Appearance: Normal appearance. He is obese.  HENT:     Head: Normocephalic and atraumatic.     Right Ear: External ear normal.     Left Ear: External ear normal.  Eyes:  Extraocular Movements: Extraocular movements intact.     Conjunctiva/sclera: Conjunctivae normal.     Pupils: Pupils are equal, round, and reactive to light.  Cardiovascular:     Rate and Rhythm: Regular rhythm. Tachycardia present.     Pulses: Normal pulses.     Heart sounds: Normal heart sounds. No murmur heard.   Pulmonary:     Effort: Tachypnea, accessory muscle usage and respiratory distress present.     Breath sounds: Rhonchi and rales present.  Abdominal:     General: Bowel sounds are normal. There is distension.     Palpations: Abdomen is soft.  Musculoskeletal:        General: Swelling present.     Cervical back: Normal range of motion.  Skin:    General: Skin is warm and dry.  Neurological:     Mental Status: He is alert.     Comments: Patient is nonambulatory, he has pads on his feet.  He has limited motion of his upper arms.  He has difficulty answering yes or no questions.  Psychiatric:        Mood and Affect: Affect is flat.        Speech: Speech is delayed.        Behavior: Behavior is slowed.     ED Results / Procedures / Treatments   Labs (all labs ordered are listed, but  only abnormal results are displayed) Results for orders placed or performed during the hospital encounter of 05/09/20  Resp Panel by RT-PCR (Flu A&B, Covid) Nasopharyngeal Swab   Specimen: Nasopharyngeal Swab; Nasopharyngeal(NP) swabs in vial transport medium  Result Value Ref Range   SARS Coronavirus 2 by RT PCR NEGATIVE NEGATIVE   Influenza A by PCR NEGATIVE NEGATIVE   Influenza B by PCR NEGATIVE NEGATIVE  Culture, blood (routine x 2)   Specimen: BLOOD RIGHT HAND  Result Value Ref Range   Specimen Description BLOOD RIGHT HAND    Special Requests      BOTTLES DRAWN AEROBIC AND ANAEROBIC Blood Culture adequate volume Performed at [facility], [address] [place], [place] [address]    Culture PENDING    Report Status PENDING   Culture, blood (routine x 2)   Specimen: BLOOD LEFT HAND  Result Value Ref Range   Specimen Description BLOOD LEFT HAND    Special Requests      BOTTLES DRAWN AEROBIC AND ANAEROBIC Blood Culture adequate volume Performed at [facility], [address], [place], [place] [address]    Culture PENDING    Report Status PENDING   Comprehensive metabolic panel  Result Value Ref Range   Sodium 145 135 - 145 mmol/L   Potassium 4.8 3.5 - 5.1 mmol/L   Chloride 101 98 - 111 mmol/L   CO2 38 (H) 22 - 32 mmol/L   Glucose, Bld 294 (H) 70 - 99 mg/dL   BUN 36 (H) 8 - 23 mg/dL   Creatinine, Ser 0.98 (H) 0.61 - 1.24 mg/dL   Calcium 8.7 (L) 8.9 - 10.3 mg/dL   Total Protein 7.7 6.5 - 8.1 g/dL   Albumin 3.2 (L) 3.5 - 5.0 g/dL   AST 19 15 - 41 U/L   ALT 18 0 - 44 U/L   Alkaline Phosphatase 51 38 - 126 U/L   Total Bilirubin 0.3 0.3 - 1.2 mg/dL   GFR, Estimated 51 (L) >60 mL/min   Anion gap 6 5 - 15  CBC with Differential  Result Value Ref Range   WBC 9.6  4.0 - 10.5 K/uL   RBC 4.10 (L) 4.22 - 5.81 MIL/uL   Hemoglobin 12.4 (L) 13.0 - 17.0 g/dL   HCT 87.5 39 - 52 %   MCV 106.3 (H) 80.0 - 100.0 fL   MCH 30.2 26.0 - 34.0 pg   MCHC 28.4 (L) 30.0 - 36.0 g/dL   RDW  64.3 32.9 - 51.8 %   Platelets 235 150 - 400 K/uL   nRBC 0.0 0.0 - 0.2 %   Neutrophils Relative % 66 %   Neutro Abs 6.3 1.7 - 7.7 K/uL   Lymphocytes Relative 23 %   Lymphs Abs 2.2 0.7 - 4.0 K/uL   Monocytes Relative 8 %   Monocytes Absolute 0.8 0.1 - 1.0 K/uL   Eosinophils Relative 3 %   Eosinophils Absolute 0.3 0.0 - 0.5 K/uL   Basophils Relative 0 %   Basophils Absolute 0.0 0.0 - 0.1 K/uL   Immature Granulocytes 0 %   Abs Immature Granulocytes 0.02 0.00 - 0.07 K/uL  Brain natriuretic peptide  Result Value Ref Range   B Natriuretic Peptide 40.0 0.0 - 100.0 pg/mL   Laboratory interpretation all normal except mild worsening of his chronic renal insufficiency, stable anemia    EKG EKG Interpretation  Date/Time:  Friday May 09 2020 23:55:06 EST Ventricular Rate:  105 PR Interval:    QRS Duration: 131 QT Interval:  353 QTC Calculation: 467 R Axis:   -45 Text Interpretation: Sinus tachycardia Left bundle branch block No significant change since last tracing 09 May 2019 Confirmed by Devoria Albe ([id number]) on 05/10/2020 12:24:44 AM   Radiology DG Chest Portable 1 View  Result Date: 05/10/2020 CLINICAL DATA:  Shortness of breath. EXAM: PORTABLE CHEST 1 VIEW COMPARISON:  May 19, 2019 FINDINGS: Mild patchy opacities are seen within the bilateral lung bases and lateral aspect of the mid right lung. These areas are mildly increased in severity when compared to the prior study. Small bilateral pleural effusions are seen. No pneumothorax is identified. The cardiac silhouette is mildly enlarged. The visualized skeletal structures are unremarkable. IMPRESSION: 1. Mild patchy bilateral lung opacities which are, in part, chronic in nature. A superimposed component of atelectasis and/or infiltrate cannot be excluded. Correlation with chest CT is recommended. 2. Small bilateral pleural effusions. Electronically Signed   By: Aram Candela M.D.   On: 05/10/2020 00:48     Procedures Procedures (including critical care time)  Medications Ordered in ED Medications  ipratropium (ATROVENT) 0.02 % nebulizer solution (0.5 mg  Given 05/10/20 0046)  albuterol (PROVENTIL) (2.5 MG/3ML) 0.083% nebulizer solution (5 mg  Given 05/10/20 0045)  clindamycin (CLEOCIN) IVPB 900 mg (0 mg Intravenous Stopped 05/10/20 0158)  sodium chloride 0.9 % bolus 500 mL (0 mLs Intravenous Stopped 05/10/20 0510)  iohexol (OMNIPAQUE) 350 MG/ML injection 100 mL (100 mLs Intravenous Contrast Given 05/10/20 0549)    ED Course  I have reviewed the triage vital signs and the nursing notes.  Pertinent labs & imaging results that were available during my care of the patient were reviewed by me and considered in my medical decision making (see chart for details).    MDM Rules/Calculators/A&P                          Patient has had a stroke and has dysphagia, he has a history of aspiration pneumonia and used to be on a feeding tube however now he is on oral fluids.  His chest x-ray is  concerning for possible reaspiration.  Although patient also has a history of DVT and prior PE, CTA was done.  At the time of my exam patient had a nonrebreather mask on getting a nebulizer with albuterol 5 mg and Atrovent 0.5 mg ordered by respiratory therapy.  Respiratory therapy states they suctioned a small amount of white and sometimes yellow mucus from his nares and throat.  Patient was started on IV clindamycin for possible aspiration pneumonia.  After reviewing his laboratory testing CTA was ordered to make sure he did not have a PE or more extensive pneumonia.  08:00 AM Dr Hyacinth Meeker will get CT results and do disposition  Final Clinical Impression(s) / ED Diagnoses Final diagnoses:  Hypoxia    Rx / DC Orders  Disposition pending  Devoria Albe, MD, Concha Pyo, MD 05/10/20 [phone]

## 2020-05-10 NOTE — ED Notes (Signed)
Patient transported to CT 

## 2020-05-10 NOTE — ED Notes (Signed)
Pt abdomen is hard and distended- Dr Deretha Emory aware.

## 2020-05-10 NOTE — ED Notes (Signed)
Date and time results received: 05/10/20 2020  Test: Blood cultures Critical Value: gram + cocci anarobic - 1 set  Name of Provider Notified: Dr. Saul Fordyce  Orders Received? Or Actions Taken?: pt to come back from SNF for admission

## 2020-05-10 NOTE — ED Provider Notes (Addendum)
[facility] EMERGENCY DEPARTMENT Provider Note   CSN: [id number] Arrival date & time: 05/10/20  2112     History Chief Complaint  Patient presents with  . postive blood cultures    [name] [name] is a 74 y.o. male.  Patient from [facility].  Patient has dysphagia due to old stroke.  Otherwise past medical is never hypertension reflux disease diabetes, hemiplegia pulmonary embolism and history of pneumonia.  Patient was seen earlier today.  Concerns was for aspiration pneumonia patient had CT scan of the chest which did not show distinctly any evidence of aspiration no pulmonary embolus.  Patient had new oxygen requirement when he was seen earlier today.  Apparently his lungs did not sound very clear.  Abdomen was distended and soft.  Was contacted that he had positive blood cultures for gram-positive cocci just in one of the 2 cultures so it could be contaminated but based on his overall picture had him return to the emergency department.  Patient here on 3 L of oxygen and has good oxygen sats.  He had a fever and met SIRS criteria for sepsis so sepsis orders were placed.  He was treated with broad-spectrum antibiotics other blood cultures done.  Patient was not hypotensive and lactic acid is less than 4 however his white blood cell count increased to 18,000.  Patient was discharged back to the [facility] on on oxygen and also was started on IV clindamycin which was to continue at the [facility].  As stated above we were notified about positive blood culture.  Recommend patient come back for reevaluation.        Past Medical History:  Diagnosis Date  . DVT (deep venous thrombosis) ([place]) 09/26/2012  . Dysphasia   . Fatty liver   . Hemiplegia ([place])   . Hypertension   . Pneumonia   . Pulmonary embolism ([place])   . 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([place])   . Stroke [facility])     Patient Active Problem List   Diagnosis Date Noted  . Aortic atherosclerosis ([place]) 05/06/2020  . Hypertension associated  with type 2 diabetes mellitus ([place]) 06/26/2019  . GERD (gastroesophageal reflux disease) 06/06/2019  . Increased oropharyngeal secretions   . 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 Elevated PSA, between 10 and less than 20 ng/ml 02/08/2019  . 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 Dysphagia due to old cerebrovascular accident-on honey thickened liquids 08/25/2018  . Hyperlipidemia associated with type 2 diabetes mellitus ([place]) 08/25/2018  . Hyperthyroidism 04/05/2017  . 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 COPD (chronic obstructive pulmonary disease) ([place]) 10/19/2016  . 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 Long term current use of anticoagulant therapy-on Eliquis due to history of DVT/PE and strokes 10/21/2012  . DVT (deep venous thrombosis) ([place]) 09/26/2012  . Chronic pulmonary embolism ([place]) 09/26/2012  . Constipation 09/26/2012    Past Surgical History:  Procedure Laterality Date  . ESOPHAGOGASTRODUODENOSCOPY (EGD) WITH PROPOFOL N/A 05/15/2019   Procedure: ESOPHAGOGASTRODUODENOSCOPY (EGD) WITH PROPOFOL;  Surgeon: Virl Cagey, MD;  Location: [facility] ORS;  Service: General;  Laterality: N/A;  . FLEXIBLE SIGMOIDOSCOPY N/A 10/21/2016   Procedure: FLEXIBLE SIGMOIDOSCOPY;  Surgeon: Rogene Houston, MD;  Location: [facility] ENDO SUITE;  Service: Endoscopy;  Laterality: N/A;  . FLEXIBLE SIGMOIDOSCOPY N/A 10/25/2016   Procedure:  FLEXIBLE SIGMOIDOSCOPY;  Surgeon: Rogene Houston, MD;  Location: [facility] ENDO SUITE;  Service: Endoscopy;  Laterality: N/A;  . PEG PLACEMENT N/A 05/15/2019   Procedure: PERCUTANEOUS ENDOSCOPIC GASTROSTOMY (PEG) PLACEMENT;  Surgeon: Virl Cagey, MD;  Location: [facility] ORS;  Service: General;  Laterality: N/A;  . unable          No family history on file.  Social History   Tobacco Use  . Smoking status: Never Smoker  . Smokeless tobacco: Never Used  Vaping Use  . Vaping Use: Never used  Substance Use Topics  . Alcohol use: No    Alcohol/week: 0.0 standard drinks  . Drug use: No    Home Medications Prior to Admission medications   Medication Sig Start Date End Date Taking? Authorizing Provider  acetaminophen (TYLENOL) 325 MG tablet Take 650 mg by mouth every 6 (six) hours as needed for moderate pain.     [provider]  apixaban (ELIQUIS) 5 MG TABS tablet Place 5 mg into feeding tube 2 (two) times daily. For dvt/pe/cva po    [provider]  atorvastatin (LIPITOR) 10 MG tablet Take 10 mg by mouth daily.    [provider]  atropine 1 % ophthalmic solution Place 1 drop under the tongue every 6 (six) hours as needed (excessive oral secretions).     [provider]  bisacodyl (DULCOLAX) 10 MG suppository Place 1 suppository (10 mg total) rectally at bedtime. 05/23/19   Manuella Ghazi, Pratik D, DO  carboxymethylcellulose (REFRESH PLUS) 0.5 % SOLN Place 1 drop into the right eye in the morning, at noon, in the evening, and at bedtime.     [provider]  clindamycin 600 mg in dextrose 5 % 100 mL Inject 600 mg into the vein every 8 (eight) hours for 7 days. 05/10/20 05/17/20  Noemi Chapel, MD  erythromycin ophthalmic ointment Place 1 application into the right eye 2 (two) times daily. Also apply to right eye - dx corneal scar - continue until next ophthalmology visit At Bedtime 04/26/20   [provider]  insulin aspart (NOVOLOG FLEXPEN) 100 UNIT/ML FlexPen Inject 15 Units into the skin 2 (two) times daily with a meal. With Lunch and Supper 04/07/20   [provider]  Insulin Glargine (BASAGLAR KWIKPEN) 100 UNIT/ML SOPN Inject 31 Units into the skin at bedtime.  04/07/20   [provider]  ipratropium-albuterol (DUONEB) 0.5-2.5 (3) MG/3ML SOLN  Take 3 mLs by nebulization every 6 (six) hours.  10/02/19   [provider]  labetalol (NORMODYNE) 200 MG tablet Take 200 mg by mouth daily. For HTN    [provider]  lactulose (CHRONULAC) 10 GM/15ML solution Give 30 cc by mouth twice a day 11/13/19   [provider]  lisinopril (ZESTRIL) 2.5 MG tablet Take 2.5 mg by mouth daily. hold for systolic < 409    [provider]  loratadine (CLARITIN) 10 MG tablet Take 10 mg by mouth daily.    [provider]  methimazole (TAPAZOLE) 5 MG tablet Take 2.5 mg by mouth daily. 11/28/18   [provider]  NON FORMULARY Diet- Liquids: Nectar Thick Liquids  Diet:Dysphagia 1 Puree    [provider]  OXYGEN Inhale 2 L into the lungs continuous. Every shift to maintain satuation >90%.    [provider]  sennosides (SENOKOT) 8.8 MG/5ML syrup Take 5 mLs by mouth 2 (two) times daily as needed for mild constipation. 06/11/19   [provider]  Vitamins A &  D (VITAMIN A & D) ointment Apply 1 application topically as needed for dry skin. To left heel qshift for prevention. 11/08/19   [provider]    Allergies    Penicillin g and Penicillins  Review of Systems   Review of Systems  Unable to perform ROS: Patient nonverbal    Physical Exam Updated Vital Signs BP 118/66   Pulse (!) 108   Temp (!) 101.7 F (38.7 C) (Rectal)   Resp 20   Ht 1.727 m (5' 8" )   Wt 111.6 kg   SpO2 98%   BMI 37.41 kg/m   Physical Exam Vitals and nursing note reviewed.  Constitutional:      General: He is in acute distress.     Appearance: He is well-developed.  HENT:     Head: Normocephalic and atraumatic.  Eyes:     Conjunctiva/sclera: Conjunctivae normal.  Cardiovascular:     Rate and Rhythm: Regular rhythm. Tachycardia present.     Heart sounds: No murmur heard.   Pulmonary:     Effort: Pulmonary effort is normal. No respiratory distress.     Breath sounds: Rhonchi and rales  present.     Comments: Diffuse rales and rhonchi. Abdominal:     General: There is distension.     Palpations: Abdomen is soft.     Tenderness: There is no abdominal tenderness.     Comments: Abdomen distended and very firm.  Musculoskeletal:        General: Normal range of motion.     Cervical back: Normal range of motion and neck supple.  Skin:    General: Skin is warm and dry.  Neurological:     Mental Status: He is alert. Mental status is at baseline.     ED Results / Procedures / Treatments   Labs (all labs ordered are listed, but only abnormal results are displayed) Labs Reviewed  CBC WITH DIFFERENTIAL/PLATELET - Abnormal; Notable for the following components:      Result Value   WBC 18.8 (*)    RBC 4.07 (*)    Hemoglobin 12.4 (*)    MCV 110.1 (*)    MCHC 27.7 (*)    Neutro Abs 17.5 (*)    Lymphs Abs 0.4 (*)    Abs Immature Granulocytes 0.14 (*)    All other components within normal limits  BASIC METABOLIC PANEL - Abnormal; Notable for the following components:   Sodium 147 (*)    CO2 38 (*)    Glucose, Bld 246 (*)    BUN 41 (*)    Creatinine, Ser 1.65 (*)    Calcium 8.5 (*)    GFR, Estimated 43 (*)    All other components within normal limits  LACTIC ACID, PLASMA - Abnormal; Notable for the following components:   Lactic Acid, Venous 2.6 (*)    All other components within normal limits  PROTIME-INR - Abnormal; Notable for the following components:   Prothrombin Time 15.9 (*)    INR 1.3 (*)    All other components within normal limits  CULTURE, BLOOD (ROUTINE X 2)  CULTURE, BLOOD (ROUTINE X 2)  URINE CULTURE  APTT  URINALYSIS, ROUTINE W REFLEX MICROSCOPIC  LACTIC ACID, PLASMA    EKG None  Radiology DG Chest 1 View  Result Date: 05/10/2020 CLINICAL DATA:  Abdominal distension and positive blood cultures. EXAM: CHEST  1 VIEW COMPARISON:  May 10, 2020 (12:04 a.m.) FINDINGS: Decreased lung volumes are noted which is likely secondary to the  degree  of patient inspiration. Mild patchy opacities are again seen within the bilateral lung bases and along the lateral aspect of the mid right lung. This is unchanged in appearance when compared to the prior exam. The small pleural effusions seen on the prior study are not clearly identified on the current exam. The cardiac silhouette is unchanged in appearance. The visualized skeletal structures are unremarkable. IMPRESSION: Persistent mild patchy bilateral opacities. These are nonspecific and may be, in part, chronic in nature, however, superimposed acute infiltrate cannot be excluded. Electronically Signed   By: Virgina Norfolk M.D.   On: 05/10/2020 22:19   CT Angio Chest PE W/Cm &/Or Wo Cm  Result Date: 05/10/2020 CLINICAL DATA:  Shortness of breath with elevated D-dimer and hypoxia. Evaluate for pulmonary embolism. EXAM: CT ANGIOGRAPHY CHEST WITH CONTRAST TECHNIQUE: Multidetector CT imaging of the chest was performed using the standard protocol during bolus administration of intravenous contrast. Multiplanar CT image reconstructions and MIPs were obtained to evaluate the vascular anatomy. CONTRAST:  141m OMNIPAQUE IOHEXOL 350 MG/ML SOLN COMPARISON:  CT chest 09/08/2005 and CT abdomen/pelvis 05/09/2019 FINDINGS: Patient unable to communicate, move or speak or reply to questions as patient was restrained for the exam. Cardiovascular: Mild-to-moderate cardiomegaly is present. Moderate calcified plaque over the 3 vessel coronary arteries. Ectasia of the ascending thoracic aorta measuring 3.9 cm in AP diameter. Calcified plaque over the thoracic aorta. Pulmonary arterial system is well opacified. There is moderate artifact over the posterior field of view of the thorax right worse than left. There is mild low-attenuation over proximal right lower lobar artery which is likely due to this artifact although embolus is possible. Also mild low-attenuation over a posterior right upper lobe segmental pulmonary artery  likely artifactual. No central or left-sided pulmonary emboli visualized. Mediastinum/Nodes: No evidence of hilar adenopathy. Single 1.3 cm right subcarinal lymph node likely reactive. Remaining mediastinal structures are unremarkable. Lungs/Pleura: Mild pleural calcification over the right apex. Lungs are adequately inflated and demonstrate bibasilar posterior consolidation likely atelectasis without significant change from the prior exam from 2020. No effusion. Severe AP narrowing of the trachea at the level of the carina with severe narrowing of the proximal mainstem bronchi bilaterally. Upper Abdomen: Calcified plaque over the abdominal aorta. Prominent ovoid air collection over the anterior aspect of the mid abdomen only partially visualized and unchanged from the previous CT abdomen representing a prominent segment of sigmoid colon. Musculoskeletal: Bilateral gynecomastia. Moderate degenerative changes of the spine. Review of the MIP images confirms the above findings. IMPRESSION: 1. No definite evidence of acute pulmonary embolism. Low-attenuation over the proximal right lower lobar artery as well as a posterior right upper lobe segmental pulmonary artery likely artifactual. Concern still remains for pulmonary embolism, follow-up CT a in 24-48 hours may be helpful. 2. Stable bibasilar consolidation likely atelectasis and not significant changed from the prior exam. Severe AP narrowing of the trachea at the level of the carina with severe narrowing of the proximal mainstem bronchi bilaterally. 1.3 cm right subcarinal lymph node likely reactive. 3. Cardiomegaly and atherosclerotic coronary artery disease. 4. Aortic atherosclerosis. Ectasia of the ascending thoracic aorta measuring 3.9 cm in AP diameter. Recommend annual imaging followup by CTA or MRA. This recommendation follows 2010 ACCF/AHA/AATS/ACR/ASA/SCA/SCAI/SIR/STS/SVM Guidelines for the Diagnosis and Management of Patients with Thoracic Aortic Disease.  Circulation.2010; 121:: Y563-S937 Aortic aneurysm NOS (ICD10-I71.9) Aortic Atherosclerosis (ICD10-I70.0). Electronically Signed   By: DMarin OlpM.D.   On: 05/10/2020 08:24   DG Chest Portable 1 View  Result Date: 05/10/2020 CLINICAL DATA:  Shortness of breath. EXAM: PORTABLE CHEST 1 VIEW COMPARISON:  May 19, 2019 FINDINGS: Mild patchy opacities are seen within the bilateral lung bases and lateral aspect of the mid right lung. These areas are mildly increased in severity when compared to the prior study. Small bilateral pleural effusions are seen. No pneumothorax is identified. The cardiac silhouette is mildly enlarged. The visualized skeletal structures are unremarkable. IMPRESSION: 1. Mild patchy bilateral lung opacities which are, in part, chronic in nature. A superimposed component of atelectasis and/or infiltrate cannot be excluded. Correlation with chest CT is recommended. 2. Small bilateral pleural effusions. Electronically Signed   By: Virgina Norfolk M.D.   On: 05/10/2020 00:48    Procedures Procedures (including critical care time) CRITICAL CARE Performed by: Fredia Sorrow Total critical care time: 45 minutes Critical care time was exclusive of separately billable procedures and treating other patients. Critical care was necessary to treat or prevent imminent or life-threatening deterioration. Critical care was time spent personally by me on the following activities: development of treatment plan with patient and/or surrogate as well as nursing, discussions with consultants, evaluation of patient's response to treatment, examination of patient, obtaining history from patient or surrogate, ordering and performing treatments and interventions, ordering and review of laboratory studies, ordering and review of radiographic studies, pulse oximetry and re-evaluation of patient's condition.   Medications Ordered in ED Medications  lactated ringers infusion ( Intravenous New  Bag/Given 05/10/20 2235)  vancomycin (VANCOREADY) IVPB 2000 mg/400 mL (2,000 mg Intravenous New Bag/Given 05/10/20 2238)  ceFEPIme (MAXIPIME) 2 g in sodium chloride 0.9 % 100 mL IVPB (2 g Intravenous New Bag/Given 05/10/20 2237)  iohexol (OMNIPAQUE) 300 MG/ML solution 100 mL (75 mLs Intravenous Contrast Given 05/10/20 2251)    ED Course  I have reviewed the triage vital signs and the nursing notes.  Pertinent labs & imaging results that were available during my care of the patient were reviewed by me and considered in my medical decision making (see chart for details).    MDM Rules/Calculators/A&P                          Patient meeting sepsis criteria.  Concern is for probable aspiration pneumonia.  Repeat chest x-ray still shows concerns about some patchy infiltrates.  Patient had CT angio chest without any pulmonary embolus earlier when he was seen.  Does have 1+ blood culture from his visit earlier today for gram-positive cocci.  Has been recultured.  Treated with broad-spectrum antibiotics.  Lungs sound very wet.  Patient not hypotensive.  Patient did not receive 30 cc/kg fluids.  Lactic acid is less than 4.  However he does have a significant jump in his white blood cell count 18,000.   Patient will require admission this time.  Abdomen is very distended and very firm.  In addition patient's had a history of sigmoid volvulus in the past.  So he will receive a CT scan of his abdomen.  His BUN and creatinine is adequate with his GFR in the 40s so that he can receive that.  Following that he will require admission.  Patient's oxygen saturations on 3 L of oxygen is in the 90s.    Final Clinical Impression(s) / ED Diagnoses Final diagnoses:  Sepsis [facility])    Rx / DC Orders ED Discharge Orders    None       Fredia Sorrow, MD 05/10/20 2308    Rogene Houston,  Nicki Reaper, MD 05/11/20 [id number]

## 2020-05-10 NOTE — ED Notes (Signed)
Date and time results received: 05/10/20 11:50 PM (use smartphrase ".now" to insert current time)  Test: lactic acid Critical Value: 2.2  Name of Provider Notified: Dr Lynelle Doctor  Orders Received? Or Actions Taken?: see chart

## 2020-05-10 NOTE — ED Notes (Signed)
DNR wrist brad placed.

## 2020-05-10 NOTE — ED Provider Notes (Signed)
Patient was left at change of shift to get results of his CT of the abdomen and pelvis.  I actually saw the patient earlier this morning when he presented with some worsening hypoxia despite being on his baseline oxygen.  CT a of the chest was done and was pending when I had change shift.  Patient presents back with positive blood cultures and now has a fever.    11:27 PM radiology called the results of his CT abdomen.  He states there is a fecal impaction and the distal sigmoid is very redundant and there are dilated loops of sigmoid colon without volvulus.  12:21 Dr Thomes Dinning, hospitalist will admit  DG Chest 1 View  Result Date: 05/10/2020 CLINICAL DATA:  Abdominal distension and positive blood cultures. EXAM: CHEST  1 VIEW COMPARISON:  May 10, 2020 (12:04 a.m.) FINDINGS: Decreased lung volumes are noted which is likely secondary to the degree of patient inspiration. Mild patchy opacities are again seen within the bilateral lung bases and along the lateral aspect of the mid right lung. This is unchanged in appearance when compared to the prior exam. The small pleural effusions seen on the prior study are not clearly identified on the current exam. The cardiac silhouette is unchanged in appearance. The visualized skeletal structures are unremarkable. IMPRESSION: Persistent mild patchy bilateral opacities. These are nonspecific and may be, in part, chronic in nature, however, superimposed acute infiltrate cannot be excluded. Electronically Signed   By: Aram Candela M.D.   On: 05/10/2020 22:19   CT Angio Chest PE W/Cm &/Or Wo Cm  Result Date: 05/10/2020 CLINICAL DATA:  Shortness of breath with elevated D-dimer and hypoxia. Evaluate for pulmonary embolism. IMPRESSION: 1. No definite evidence of acute pulmonary embolism. Low-attenuation over the proximal right lower lobar artery as well as a posterior right upper lobe segmental pulmonary artery likely artifactual. Concern still remains for  pulmonary embolism, follow-up CT a in 24-48 hours may be helpful. 2. Stable bibasilar consolidation likely atelectasis and not significant changed from the prior exam. Severe AP narrowing of the trachea at the level of the carina with severe narrowing of the proximal mainstem bronchi bilaterally. 1.3 cm right subcarinal lymph node likely reactive. 3. Cardiomegaly and atherosclerotic coronary artery disease. 4. Aortic atherosclerosis. Ectasia of the ascending thoracic aorta measuring 3.9 cm in AP diameter. Recommend annual imaging followup by CTA or MRA. This recommendation follows 2010 ACCF/AHA/AATS/ACR/ASA/SCA/SCAI/SIR/STS/SVM Guidelines for the Diagnosis and Management of Patients with Thoracic Aortic Disease. Circulation.2010; 121: [id number]. Aortic aneurysm NOS (ICD10-I71.9) Aortic Atherosclerosis (ICD10-I70.0). Electronically Signed   By: Elberta Fortis M.D.   On: 05/10/2020 08:24   CT Abdomen Pelvis W Contrast  Result Date: 05/10/2020 CLINICAL DATA:  Abdominal distension and positive blood cultures. EXAM: CT ABDOMEN AND PELVIS WITH CONTRAST TECHNIQUE: Multidetector CT imaging of the abdomen and pelvis was performed using the standard protocol following bolus administration of intravenous contrast. CONTRAST:  68mL OMNIPAQUE IOHEXOL 300 MG/ML  SOLN COMPARISON:  May 09, 2019 FINDINGS: Lower chest: Stable moderate severity areas of consolidation are seen along the posterior aspect of the bilateral lung bases. Mild surrounding atelectatic changes are noted. Hepatobiliary: No focal liver abnormality is seen. Ill-defined subcentimeter gallstones are seen within the lumen of a contracted gallbladder. There is no evidence of biliary dilatation. Pancreas: Unremarkable. No pancreatic ductal dilatation or surrounding inflammatory changes. Spleen: Normal in size without focal abnormality. Adrenals/Urinary Tract: Adrenal glands are unremarkable. Kidneys are normal in size, without renal calculi or hydronephrosis.  Multiple ill-defined subcentimeter  bilateral renal cysts are seen. Contrast is seen throughout the urinary bladder lumen. Stomach/Bowel: Stomach is within normal limits. Appendix appears normal. A large amount of stool is seen within the distal aspect of a redundant sigmoid colon. Dilatation of the distal sigmoid colon is seen proximal to this region. The numerous noninflamed diverticula are seen within the sigmoid colon. Vascular/Lymphatic: There is marked severity calcification of the abdominal aorta and bilateral common iliac arteries, without evidence of aneurysmal dilatation. No enlarged abdominal or pelvic lymph nodes. Reproductive: Prostate is unremarkable. Other: No abdominal wall hernia or abnormality. No abdominopelvic ascites. Musculoskeletal: Multilevel degenerative changes seen throughout the lumbar spine. IMPRESSION: 1. Findings consistent with fecal impaction within the distal sigmoid colon with subsequent distal large bowel obstruction. 2. Stable moderate severity areas of bibasilar consolidation. 3. Cholelithiasis. 4. Sigmoid diverticulosis. Aortic Atherosclerosis (ICD10-I70.0). Electronically Signed   By: Aram Candela M.D.   On: 05/10/2020 23:20   DG Chest Portable 1 View  Result Date: 05/10/2020 CLINICAL DATA:  Shortness of breath. EXAM: PORTABLE CHEST 1 VIEW COMPARISON:  May 19, 2019 FINDINGS: Mild patchy opacities are seen within the bilateral lung bases and lateral aspect of the mid right lung. These areas are mildly increased in severity when compared to the prior study. Small bilateral pleural effusions are seen. No pneumothorax is identified. The cardiac silhouette is mildly enlarged. The visualized skeletal structures are unremarkable. IMPRESSION: 1. Mild patchy bilateral lung opacities which are, in part, chronic in nature. A superimposed component of atelectasis and/or infiltrate cannot be excluded. Correlation with chest CT is recommended. 2. Small bilateral pleural  effusions. Electronically Signed   By: Aram Candela M.D.   On: 05/10/2020 00:48   Diagnoses that have been ruled out:  None  Diagnoses that are still under consideration:  None  Final diagnoses:  Sepsis, due to unspecified organism, unspecified whether acute organ dysfunction present [facility])  Fecal impaction [facility])   Plan admission  Devoria Albe, MD, Concha Pyo, MD 05/11/20 Rich Fuchs

## 2020-05-10 NOTE — ED Notes (Signed)
Date and time results received: 05/10/20 11:33 PM(use smartphrase ".now" to insert current time)  Test: blood cultures positive Critical Value: gram positive cocci 2nd set  Name of Provider Notified: Dr Lynelle Doctor  Orders Received? Or Actions Taken?: see chart

## 2020-05-11 ENCOUNTER — Other Ambulatory Visit: Payer: Self-pay

## 2020-05-11 DIAGNOSIS — K56609 Unspecified intestinal obstruction, unspecified as to partial versus complete obstruction: Secondary | ICD-10-CM | POA: Diagnosis not present

## 2020-05-11 DIAGNOSIS — E872 Acidosis, unspecified: Secondary | ICD-10-CM

## 2020-05-11 DIAGNOSIS — N179 Acute kidney failure, unspecified: Secondary | ICD-10-CM | POA: Diagnosis present

## 2020-05-11 DIAGNOSIS — R6520 Severe sepsis without septic shock: Secondary | ICD-10-CM | POA: Diagnosis not present

## 2020-05-11 DIAGNOSIS — Z794 Long term (current) use of insulin: Secondary | ICD-10-CM | POA: Diagnosis not present

## 2020-05-11 DIAGNOSIS — E785 Hyperlipidemia, unspecified: Secondary | ICD-10-CM

## 2020-05-11 DIAGNOSIS — J9622 Acute and chronic respiratory failure with hypercapnia: Secondary | ICD-10-CM

## 2020-05-11 DIAGNOSIS — Z741 Need for assistance with personal care: Secondary | ICD-10-CM | POA: Diagnosis not present

## 2020-05-11 DIAGNOSIS — G8191 Hemiplegia, unspecified affecting right dominant side: Secondary | ICD-10-CM | POA: Diagnosis not present

## 2020-05-11 DIAGNOSIS — M24541 Contracture, right hand: Secondary | ICD-10-CM | POA: Diagnosis not present

## 2020-05-11 DIAGNOSIS — Z9981 Dependence on supplemental oxygen: Secondary | ICD-10-CM | POA: Diagnosis not present

## 2020-05-11 DIAGNOSIS — I69391 Dysphagia following cerebral infarction: Secondary | ICD-10-CM | POA: Diagnosis not present

## 2020-05-11 DIAGNOSIS — I10 Essential (primary) hypertension: Secondary | ICD-10-CM | POA: Diagnosis present

## 2020-05-11 DIAGNOSIS — K5641 Fecal impaction: Secondary | ICD-10-CM | POA: Diagnosis not present

## 2020-05-11 DIAGNOSIS — Z79899 Other long term (current) drug therapy: Secondary | ICD-10-CM | POA: Diagnosis not present

## 2020-05-11 DIAGNOSIS — E870 Hyperosmolality and hypernatremia: Secondary | ICD-10-CM | POA: Diagnosis present

## 2020-05-11 DIAGNOSIS — R1310 Dysphagia, unspecified: Secondary | ICD-10-CM | POA: Diagnosis present

## 2020-05-11 DIAGNOSIS — K5900 Constipation, unspecified: Secondary | ICD-10-CM | POA: Diagnosis present

## 2020-05-11 DIAGNOSIS — R140 Abdominal distension (gaseous): Secondary | ICD-10-CM | POA: Diagnosis not present

## 2020-05-11 DIAGNOSIS — M419 Scoliosis, unspecified: Secondary | ICD-10-CM | POA: Diagnosis not present

## 2020-05-11 DIAGNOSIS — J9621 Acute and chronic respiratory failure with hypoxia: Secondary | ICD-10-CM | POA: Diagnosis present

## 2020-05-11 DIAGNOSIS — D72829 Elevated white blood cell count, unspecified: Secondary | ICD-10-CM

## 2020-05-11 DIAGNOSIS — M6281 Muscle weakness (generalized): Secondary | ICD-10-CM | POA: Diagnosis not present

## 2020-05-11 DIAGNOSIS — Z7189 Other specified counseling: Secondary | ICD-10-CM | POA: Diagnosis not present

## 2020-05-11 DIAGNOSIS — E1169 Type 2 diabetes mellitus with other specified complication: Secondary | ICD-10-CM

## 2020-05-11 DIAGNOSIS — A419 Sepsis, unspecified organism: Secondary | ICD-10-CM | POA: Diagnosis present

## 2020-05-11 DIAGNOSIS — J449 Chronic obstructive pulmonary disease, unspecified: Secondary | ICD-10-CM | POA: Diagnosis not present

## 2020-05-11 DIAGNOSIS — R1312 Dysphagia, oropharyngeal phase: Secondary | ICD-10-CM | POA: Diagnosis not present

## 2020-05-11 DIAGNOSIS — M160 Bilateral primary osteoarthritis of hip: Secondary | ICD-10-CM | POA: Diagnosis not present

## 2020-05-11 DIAGNOSIS — H179 Unspecified corneal scar and opacity: Secondary | ICD-10-CM | POA: Diagnosis not present

## 2020-05-11 DIAGNOSIS — Z7901 Long term (current) use of anticoagulants: Secondary | ICD-10-CM | POA: Diagnosis not present

## 2020-05-11 DIAGNOSIS — R0602 Shortness of breath: Secondary | ICD-10-CM | POA: Diagnosis not present

## 2020-05-11 DIAGNOSIS — Z66 Do not resuscitate: Secondary | ICD-10-CM | POA: Diagnosis present

## 2020-05-11 DIAGNOSIS — Z515 Encounter for palliative care: Secondary | ICD-10-CM | POA: Diagnosis not present

## 2020-05-11 DIAGNOSIS — Z880 Allergy status to penicillin: Secondary | ICD-10-CM | POA: Diagnosis not present

## 2020-05-11 DIAGNOSIS — I639 Cerebral infarction, unspecified: Secondary | ICD-10-CM | POA: Diagnosis not present

## 2020-05-11 DIAGNOSIS — K6389 Other specified diseases of intestine: Secondary | ICD-10-CM | POA: Diagnosis not present

## 2020-05-11 DIAGNOSIS — E1165 Type 2 diabetes mellitus with hyperglycemia: Secondary | ICD-10-CM

## 2020-05-11 DIAGNOSIS — Z20822 Contact with and (suspected) exposure to covid-19: Secondary | ICD-10-CM | POA: Diagnosis present

## 2020-05-11 DIAGNOSIS — E0590 Thyrotoxicosis, unspecified without thyrotoxic crisis or storm: Secondary | ICD-10-CM | POA: Diagnosis present

## 2020-05-11 DIAGNOSIS — E1159 Type 2 diabetes mellitus with other circulatory complications: Secondary | ICD-10-CM | POA: Diagnosis present

## 2020-05-11 DIAGNOSIS — I2782 Chronic pulmonary embolism: Secondary | ICD-10-CM | POA: Diagnosis present

## 2020-05-11 DIAGNOSIS — K760 Fatty (change of) liver, not elsewhere classified: Secondary | ICD-10-CM | POA: Diagnosis present

## 2020-05-11 DIAGNOSIS — Z993 Dependence on wheelchair: Secondary | ICD-10-CM | POA: Diagnosis not present

## 2020-05-11 DIAGNOSIS — K5981 Ogilvie syndrome: Secondary | ICD-10-CM | POA: Diagnosis not present

## 2020-05-11 DIAGNOSIS — J690 Pneumonitis due to inhalation of food and vomit: Secondary | ICD-10-CM | POA: Diagnosis present

## 2020-05-11 DIAGNOSIS — K5939 Other megacolon: Secondary | ICD-10-CM | POA: Diagnosis not present

## 2020-05-11 DIAGNOSIS — I69328 Other speech and language deficits following cerebral infarction: Secondary | ICD-10-CM | POA: Diagnosis not present

## 2020-05-11 DIAGNOSIS — E058 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: Secondary | ICD-10-CM | POA: Diagnosis not present

## 2020-05-11 DIAGNOSIS — Z86718 Personal history of other venous thrombosis and embolism: Secondary | ICD-10-CM | POA: Diagnosis not present

## 2020-05-11 DIAGNOSIS — Q433 Congenital malformations of intestinal fixation: Secondary | ICD-10-CM | POA: Diagnosis not present

## 2020-05-11 DIAGNOSIS — M4186 Other forms of scoliosis, lumbar region: Secondary | ICD-10-CM | POA: Diagnosis not present

## 2020-05-11 DIAGNOSIS — I69351 Hemiplegia and hemiparesis following cerebral infarction affecting right dominant side: Secondary | ICD-10-CM | POA: Diagnosis not present

## 2020-05-11 LAB — CBC
HCT: 42.8 % (ref 39.0–52.0)
Hemoglobin: 11.8 g/dL — ABNORMAL LOW (ref 13.0–17.0)
MCH: 30.9 pg (ref 26.0–34.0)
MCHC: 27.6 g/dL — ABNORMAL LOW (ref 30.0–36.0)
MCV: 112 fL — ABNORMAL HIGH (ref 80.0–100.0)
Platelets: 211 10*3/uL (ref 150–400)
RBC: 3.82 MIL/uL — ABNORMAL LOW (ref 4.22–5.81)
RDW: 14 % (ref 11.5–15.5)
WBC: 27.2 10*3/uL — ABNORMAL HIGH (ref 4.0–10.5)
nRBC: 0 % (ref 0.0–0.2)

## 2020-05-11 LAB — COMPREHENSIVE METABOLIC PANEL
ALT: 55 U/L — ABNORMAL HIGH (ref 0–44)
AST: 68 U/L — ABNORMAL HIGH (ref 15–41)
Albumin: 3 g/dL — ABNORMAL LOW (ref 3.5–5.0)
Alkaline Phosphatase: 52 U/L (ref 38–126)
Anion gap: 9 (ref 5–15)
BUN: 42 mg/dL — ABNORMAL HIGH (ref 8–23)
CO2: 36 mmol/L — ABNORMAL HIGH (ref 22–32)
Calcium: 8.3 mg/dL — ABNORMAL LOW (ref 8.9–10.3)
Chloride: 103 mmol/L (ref 98–111)
Creatinine, Ser: 1.78 mg/dL — ABNORMAL HIGH (ref 0.61–1.24)
GFR, Estimated: 40 mL/min — ABNORMAL LOW (ref >60)
Glucose, Bld: 173 mg/dL — ABNORMAL HIGH (ref 70–99)
Potassium: 5.3 mmol/L — ABNORMAL HIGH (ref 3.5–5.1)
Sodium: 148 mmol/L — ABNORMAL HIGH (ref 135–145)
Total Bilirubin: 0.6 mg/dL (ref 0.3–1.2)
Total Protein: 7.3 g/dL (ref 6.5–8.1)

## 2020-05-11 LAB — BASIC METABOLIC PANEL
Anion gap: 5 (ref 5–15)
BUN: 40 mg/dL — ABNORMAL HIGH (ref 8–23)
CO2: 38 mmol/L — ABNORMAL HIGH (ref 22–32)
Calcium: 8.1 mg/dL — ABNORMAL LOW (ref 8.9–10.3)
Chloride: 105 mmol/L (ref 98–111)
Creatinine, Ser: 1.6 mg/dL — ABNORMAL HIGH (ref 0.61–1.24)
GFR, Estimated: 45 mL/min — ABNORMAL LOW (ref >60)
Glucose, Bld: 130 mg/dL — ABNORMAL HIGH (ref 70–99)
Potassium: 4.9 mmol/L (ref 3.5–5.1)
Sodium: 148 mmol/L — ABNORMAL HIGH (ref 135–145)

## 2020-05-11 LAB — URINALYSIS, ROUTINE W REFLEX MICROSCOPIC
Bacteria, UA: NONE SEEN
Bilirubin Urine: NEGATIVE
Glucose, UA: NEGATIVE mg/dL
Ketones, ur: NEGATIVE mg/dL
Leukocytes,Ua: NEGATIVE
Nitrite: NEGATIVE
Protein, ur: 30 mg/dL — AB
Specific Gravity, Urine: 1.028 (ref 1.005–1.030)
pH: 5 (ref 5.0–8.0)

## 2020-05-11 LAB — MRSA PCR SCREENING: MRSA by PCR: POSITIVE — AB

## 2020-05-11 LAB — BLOOD CULTURE ID PANEL (REFLEXED) - BCID2

## 2020-05-11 LAB — MAGNESIUM: Magnesium: 2.3 mg/dL (ref 1.7–2.4)

## 2020-05-11 LAB — PROTIME-INR
INR: 1.3 — ABNORMAL HIGH (ref 0.8–1.2)
Prothrombin Time: 16 seconds — ABNORMAL HIGH (ref 11.4–15.2)

## 2020-05-11 LAB — GLUCOSE, CAPILLARY
Glucose-Capillary: 105 mg/dL — ABNORMAL HIGH (ref 70–99)
Glucose-Capillary: 119 mg/dL — ABNORMAL HIGH (ref 70–99)
Glucose-Capillary: 158 mg/dL — ABNORMAL HIGH (ref 70–99)
Glucose-Capillary: 161 mg/dL — ABNORMAL HIGH (ref 70–99)
Glucose-Capillary: 82 mg/dL (ref 70–99)
Glucose-Capillary: 83 mg/dL (ref 70–99)

## 2020-05-11 LAB — PHOSPHORUS: Phosphorus: 4.7 mg/dL — ABNORMAL HIGH (ref 2.5–4.6)

## 2020-05-11 LAB — LACTIC ACID, PLASMA
Lactic Acid, Venous: 1.6 mmol/L (ref 0.5–1.9)
Lactic Acid, Venous: 1.3 mmol/L (ref 0.5–1.9)

## 2020-05-11 LAB — APTT: aPTT: 30 seconds (ref 24–36)

## 2020-05-11 MED ORDER — SODIUM CHLORIDE 0.9 % IV SOLN: INTRAVENOUS | Status: DC

## 2020-05-11 MED ORDER — LACTULOSE ENEMA
300.0000 mL | Freq: Once | ORAL | Status: AC
  Administered 2020-05-11: 300 mL via RECTAL
  Filled 2020-05-11: qty 300

## 2020-05-11 MED ORDER — MUPIROCIN 2 % EX OINT
1.0000 "application " | TOPICAL_OINTMENT | Freq: Two times a day (BID) | CUTANEOUS | Status: AC
  Administered 2020-05-11 – 2020-05-15 (×8): 1 via NASAL
  Filled 2020-05-11: qty 22

## 2020-05-11 MED ORDER — METRONIDAZOLE IN NACL 5-0.79 MG/ML-% IV SOLN
500.0000 mg | Freq: Three times a day (TID) | INTRAVENOUS | Status: DC
  Administered 2020-05-11 – 2020-05-13 (×7): 500 mg via INTRAVENOUS
  Filled 2020-05-11 (×8): qty 100

## 2020-05-11 MED ORDER — CHLORHEXIDINE GLUCONATE CLOTH 2 % EX PADS
6.0000 | MEDICATED_PAD | Freq: Every day | CUTANEOUS | Status: AC
  Administered 2020-05-12 – 2020-05-15 (×4): 6 via TOPICAL

## 2020-05-11 MED ORDER — HEPARIN SODIUM (PORCINE) 5000 UNIT/ML IJ SOLN: 5000.0000 [IU] | Freq: Three times a day (TID) | INTRAMUSCULAR | Status: DC

## 2020-05-11 MED ORDER — IPRATROPIUM-ALBUTEROL 0.5-2.5 (3) MG/3ML IN SOLN
3.0000 mL | Freq: Four times a day (QID) | RESPIRATORY_TRACT | Status: DC | PRN
  Administered 2020-05-11 – 2020-05-14 (×3): 3 mL via RESPIRATORY_TRACT
  Filled 2020-05-11 (×2): qty 3

## 2020-05-11 MED ORDER — VANCOMYCIN HCL 1250 MG/250ML IV SOLN
1250.0000 mg | INTRAVENOUS | Status: DC
  Administered 2020-05-11 – 2020-05-13 (×3): 1250 mg via INTRAVENOUS
  Filled 2020-05-11 (×3): qty 250

## 2020-05-11 MED ORDER — INSULIN ASPART 100 UNIT/ML ~~LOC~~ SOLN
0.0000 [IU] | SUBCUTANEOUS | Status: DC
  Administered 2020-05-11 (×2): 2 [IU] via SUBCUTANEOUS
  Administered 2020-05-12: 1 [IU] via SUBCUTANEOUS
  Administered 2020-05-12: 2 [IU] via SUBCUTANEOUS
  Administered 2020-05-12 – 2020-05-13 (×6): 1 [IU] via SUBCUTANEOUS
  Administered 2020-05-13 – 2020-05-14 (×2): 2 [IU] via SUBCUTANEOUS
  Administered 2020-05-14 (×2): 1 [IU] via SUBCUTANEOUS
  Administered 2020-05-14: 2 [IU] via SUBCUTANEOUS
  Administered 2020-05-14 – 2020-05-15 (×3): 1 [IU] via SUBCUTANEOUS
  Administered 2020-05-15: 2 [IU] via SUBCUTANEOUS
  Administered 2020-05-15: 5 [IU] via SUBCUTANEOUS
  Administered 2020-05-15 – 2020-05-16 (×3): 2 [IU] via SUBCUTANEOUS
  Administered 2020-05-16: 1 [IU] via SUBCUTANEOUS
  Administered 2020-05-16 – 2020-05-17 (×5): 2 [IU] via SUBCUTANEOUS
  Administered 2020-05-17: 1 [IU] via SUBCUTANEOUS
  Administered 2020-05-18: 2 [IU] via SUBCUTANEOUS
  Administered 2020-05-18: 1 [IU] via SUBCUTANEOUS
  Administered 2020-05-18: 2 [IU] via SUBCUTANEOUS
  Administered 2020-05-18 (×2): 1 [IU] via SUBCUTANEOUS
  Administered 2020-05-18: 2 [IU] via SUBCUTANEOUS
  Administered 2020-05-18: 3 [IU] via SUBCUTANEOUS
  Administered 2020-05-19 (×2): 2 [IU] via SUBCUTANEOUS
  Administered 2020-05-19: 1 [IU] via SUBCUTANEOUS
  Administered 2020-05-20: 3 [IU] via SUBCUTANEOUS
  Administered 2020-05-20: 1 [IU] via SUBCUTANEOUS
  Administered 2020-05-20: 2 [IU] via SUBCUTANEOUS
  Administered 2020-05-20: 1 [IU] via SUBCUTANEOUS
  Administered 2020-05-21: 3 [IU] via SUBCUTANEOUS
  Administered 2020-05-21: 2 [IU] via SUBCUTANEOUS
  Administered 2020-05-21 – 2020-05-22 (×4): 1 [IU] via SUBCUTANEOUS
  Administered 2020-05-22: 2 [IU] via SUBCUTANEOUS

## 2020-05-11 MED ORDER — ENOXAPARIN SODIUM 120 MG/0.8ML ~~LOC~~ SOLN
110.0000 mg | Freq: Two times a day (BID) | SUBCUTANEOUS | Status: DC
  Administered 2020-05-11 – 2020-05-22 (×23): 110 mg via SUBCUTANEOUS
  Filled 2020-05-11 (×23): qty 0.8

## 2020-05-11 MED ORDER — SODIUM CHLORIDE 0.9 % IV SOLN
2.0000 g | Freq: Two times a day (BID) | INTRAVENOUS | Status: DC
  Administered 2020-05-11 – 2020-05-12 (×4): 2 g via INTRAVENOUS
  Filled 2020-05-11 (×5): qty 2

## 2020-05-11 MED ORDER — ACETAMINOPHEN 650 MG RE SUPP
650.0000 mg | Freq: Four times a day (QID) | RECTAL | Status: DC | PRN
  Administered 2020-05-13 – 2020-05-14 (×2): 650 mg via RECTAL
  Filled 2020-05-11 (×2): qty 1

## 2020-05-11 NOTE — Progress Notes (Signed)
ANTICOAGULATION CONSULT NOTE - Initial Consult  Pharmacy Consult for Lovenox (Apixaban on hold) Indication: History of DVT/PE  Allergies  Allergen Reactions  . Penicillin G   . Penicillins     Has patient had a PCN reaction causing immediate rash, facial/tongue/throat swelling, SOB or lightheadedness with hypotension: unknown Has patient had a PCN reaction causing severe rash involving mucus membranes or skin necrosis: unknown Has patient had a PCN reaction that required hospitalization: unknown Has patient had a PCN reaction occurring within the last 10 years: unknown If all of the above answers are "NO", then may proceed with Cephalosporin use. 5/3 Tolerated Cefepime x 1 dose in ED.     Patient Measurements: Height: 5\' 8"  (172.7 cm) Weight: 111.6 kg (246 lb 0.5 oz) IBW/kg (Calculated) : 68.4  Vital Signs: Temp: 98.2 F (36.8 C) (11/21 0141) Temp Source: Oral (11/21 0141) BP: 115/70 (11/21 0141) Pulse Rate: 102 (11/21 0141)  Labs: Recent Labs    05/10/20 0121 05/10/20 2141  HGB 12.4* 12.4*  HCT 43.6 44.8  PLT 235 229  APTT  --  29  LABPROT  --  15.9*  INR  --  1.3*  CREATININE 1.45* 1.65*    Estimated Creatinine Clearance: 47.6 mL/min (A) (by C-G formula based on SCr of 1.65 mg/dL (H)).   Medical History: Past Medical History:  Diagnosis Date  . DVT (deep venous thrombosis) (HCC) 09/26/2012  . Dysphasia   . Fatty liver   . Hemiplegia (HCC)   . Hypertension   . Pneumonia   . Pulmonary embolism (HCC)   . 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(HCC)   . Stroke [facility])     Assessment: 74 y/o M with history of DVT/PE on Apixaban PTA presents to the ED with ?sepsis. Holding Apixaban and starting Lovenox for now. Hgb 12.4, plts good, renal function ok for q12h dosing.   Goal of Therapy:  Monitor platelets by anticoagulation protocol: Yes   Plan:  Lovenox 1 mg/kg subcutaneous q12h Daily CBC/HL Monitor for bleeding  66, PharmD, BCPS Clinical Pharmacist Phone:  [phone]

## 2020-05-11 NOTE — Progress Notes (Signed)
Telemetry tech called that Patient had 9 beats of Vtach. Now ST at 106. MD notified.

## 2020-05-11 NOTE — Consult Note (Signed)
Reason for Consult: Large bowel obstruction Referring Physician: Dr. Remus Loffler is an 74 y.o. male.  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  He was noted to have a positive blood culture.  He was admitted to the hospital for treatment of his aspiration pneumonia, but he was also noted to have significant abdominal distention and a CT scan of the abdomen revealed an enlarged sigmoid: With possible fecal impaction.  There does not appear to be evidence of pneumatosis.  No volvulus was seen.  A Flexi-Seal rectal tube has been placed with some output.  He does have abnormalities including leukocytosis and elevated lactic acid level.  Past Medical History:  Diagnosis Date  . DVT (deep venous thrombosis) (HCC) 09/26/2012  . Dysphasia   . Fatty liver   . Hemiplegia (HCC)   . Hypertension   . Pneumonia   . Pulmonary embolism (HCC)   . 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(HCC)   . Stroke [facility])     Past Surgical History:  Procedure Laterality Date  . ESOPHAGOGASTRODUODENOSCOPY (EGD) WITH PROPOFOL N/A 05/15/2019   Procedure: ESOPHAGOGASTRODUODENOSCOPY (EGD) WITH PROPOFOL;  Surgeon: Lucretia Roers, MD;  Location: [facility] ORS;  Service: General;  Laterality: N/A;  . FLEXIBLE SIGMOIDOSCOPY N/A 10/21/2016   Procedure: FLEXIBLE SIGMOIDOSCOPY;  Surgeon: Malissa Hippo, MD;  Location: [facility] ENDO SUITE;  Service: Endoscopy;  Laterality: N/A;  . FLEXIBLE SIGMOIDOSCOPY N/A 10/25/2016   Procedure: FLEXIBLE SIGMOIDOSCOPY;  Surgeon: Malissa Hippo, MD;  Location: [facility] END[facility] SU[facility];  Service: Endoscopy;  Laterality: N/A;  . PEG PLACEMENT N/A 05/15/2019   Procedure: PERCUTANEOUS ENDOSCOPIC GASTROSTOMY (PEG) PLACEMENT;  Surgeon:  Lucretia Roers, MD;  Location: [facility] ORS;  Service: General;  Laterality: N/A;  . unable      No family history on file.  Social History:  reports that he has never smoked. He has never used smokeless tobacco. He reports that he does not drink alcohol and does not use drugs.  Allergies:  Allergies  Allergen Reactions  . Penicillin G   . Penicillins     Has patient had a PCN reaction causing immediate rash, facial/tongue/throat swelling, SOB or lightheadedness with hypotension: unknown Has patient had a PCN reaction causing severe rash involving mucus membranes or skin necrosis: unknown Has patient had a PCN reaction that required hospitalization: unknown Has patient had a PCN reaction occurring within the last 10 years: unknown If all of the above answers are "NO", then may proceed with Cephalosporin use. 5/3 Tolerated Cefepime x 1 dose in ED.     Medications: I have reviewed the patient's current medications.  Results for orders placed or performed during the hospital encounter of 05/10/20 (from the past 48 hour(s))  CBC with Differential/Platelet     Status: Abnormal   Collection Time: 05/10/20  9:41 PM  Result Value Ref Range   WBC 18.8 (H) 4.0 - 10.5 K/uL   RBC 4.07 (L) 4.22 - 5.81 MIL/uL   Hemoglobin 12.4 (L) 13.0 - 17.0 g/dL   HCT 40.9 39 - 52 %   MCV 110.1 (H) 80.0 - 100.0 fL   MCH 30.5 26.0 - 34.0 pg   MCHC 27.7 (L) 30.0 - 36.0 g/dL   RDW 81.1 91.4 - 78.2 %   Platelets  229 150 - 400 K/uL   nRBC 0.0 0.0 - 0.2 %   Neutrophils Relative % 94 %   Neutro Abs 17.5 (H) 1.7 - 7.7 K/uL   Lymphocytes Relative 2 %   Lymphs Abs 0.4 (L) 0.7 - 4.0 K/uL   Monocytes Relative 3 %   Monocytes Absolute 0.6 0.1 - 1.0 K/uL   Eosinophils Relative 0 %   Eosinophils Absolute 0.0 0.0 - 0.5 K/uL   Basophils Relative 0 %   Basophils Absolute 0.1 0.0 - 0.1 K/uL   Immature Granulocytes 1 %   Abs Immature Granulocytes 0.14 (H) 0.00 - 0.07 K/uL    Comment: Performed at [facility],  [address], [place], [place] [address]  Basic metabolic panel     Status: Abnormal   Collection Time: 05/10/20  9:41 PM  Result Value Ref Range   Sodium 147 (H) 135 - 145 mmol/L   Potassium 4.4 3.5 - 5.1 mmol/L   Chloride 102 98 - 111 mmol/L   CO2 38 (H) 22 - 32 mmol/L   Glucose, Bld 246 (H) 70 - 99 mg/dL    Comment: Glucose reference range applies only to samples taken after fasting for at least 8 hours.   BUN 41 (H) 8 - 23 mg/dL   Creatinine, Ser 8.03 (H) 0.61 - 1.24 mg/dL   Calcium 8.5 (L) 8.9 - 10.3 mg/dL   GFR, Estimated 43 (L) >60 mL/min    Comment: (NOTE) Calculated using the CKD-EPI Creatinine Equation (2021)    Anion gap 7 5 - 15    Comment: Performed at [facility], [address] [place], [place] [address]  Lactic acid, plasma     Status: Abnormal   Collection Time: 05/10/20  9:41 PM  Result Value Ref Range   Lactic Acid, Venous 2.6 (HH) 0.5 - 1.9 mmol/L    Comment: CRITICAL RESULT CALLED TO, READ BACK BY AND VERIFIED WITH: S TURNER AT 2230 ON 05/10/2020 BY MOSLEY,J Performed at [facility], [address], [place], [place] [address]   Culture, blood (Routine X 2) w Reflex to ID Panel     Status: None (Preliminary result)   Collection Time: 05/10/20  9:41 PM   Specimen: Right Antecubital; Blood  Result Value Ref Range   Specimen Description RIGHT ANTECUBITAL    Special Requests      BOTTLES DRAWN AEROBIC AND ANAEROBIC Blood Culture adequate volume   Culture      NO GROWTH < 12 HOURS Performed at [facility], [address], [place], [place] [address]    Report Status PENDING   Protime-INR     Status: Abnormal   Collection Time: 05/10/20  9:41 PM  Result Value Ref Range   Prothrombin Time 15.9 (H) 11.4 - 15.2 seconds   INR 1.3 (H) 0.8 - 1.2    Comment: (NOTE) INR goal varies based on device and disease states. Performed at [facility], [address] [place], [place] [address]   APTT     Status: None   Collection Time: 05/10/20  9:41 PM  Result Value Ref  Range   aPTT 29 24 - 36 seconds    Comment: Performed at [facility], [address], [place], [place] [address]  Culture, blood (Routine X 2) w Reflex to ID Panel     Status: None (Preliminary result)   Collection Time: 05/10/20 10:30 PM   Specimen: Left Antecubital; Blood  Result Value Ref Range   Specimen Description LEFT ANTECUBITAL  Special Requests      BOTTLES DRAWN AEROBIC AND ANAEROBIC Blood Culture results may not be optimal due to an inadequate volume of blood received in culture bottles   Culture      NO GROWTH < 12 HOURS Performed at [facility], [address], [place], [place] [address]    Report Status PENDING   Lactic acid, plasma     Status: Abnormal   Collection Time: 05/10/20 11:20 PM  Result Value Ref Range   Lactic Acid, Venous 2.2 (HH) 0.5 - 1.9 mmol/L    Comment: CRITICAL RESULT CALLED TO, READ BACK BY AND VERIFIED WITH: TURNER,C ON 05/10/20 BY JUW Performed at [facility], [address] [place], [place] [address]   MRSA PCR Screening     Status: Abnormal   Collection Time: 05/11/20  2:14 AM   Specimen: Nasal Mucosa; Nasopharyngeal  Result Value Ref Range   MRSA by PCR POSITIVE (A) NEGATIVE    Comment:        The GeneXpert MRSA Assay (FDA approved for NASAL specimens only), is one component of a comprehensive MRSA colonization surveillance program. It is not intended to diagnose MRSA infection nor to guide or monitor treatment for MRSA infections. RESULT CALLED TO, READ BACK BY AND VERIFIED WITH: SINSKIE,P @ [phone] ON 05/11/20 BY JUW Performed at [facility], [address], [place], [place] [address]   Glucose, capillary     Status: Abnormal   Collection Time: 05/11/20  3:43 AM  Result Value Ref Range   Glucose-Capillary 158 (H) 70 - 99 mg/dL    Comment: Glucose reference range applies only to samples taken after fasting for at least 8 hours.  Comprehensive metabolic panel     Status: Abnormal   Collection Time: 05/11/20  5:44 AM  Result  Value Ref Range   Sodium 148 (H) 135 - 145 mmol/L   Potassium 5.3 (H) 3.5 - 5.1 mmol/L    Comment: DELTA CHECK NOTED   Chloride 103 98 - 111 mmol/L   CO2 36 (H) 22 - 32 mmol/L   Glucose, Bld 173 (H) 70 - 99 mg/dL    Comment: Glucose reference range applies only to samples taken after fasting for at least 8 hours.   BUN 42 (H) 8 - 23 mg/dL   Creatinine, Ser 6.43 (H) 0.61 - 1.24 mg/dL   Calcium 8.3 (L) 8.9 - 10.3 mg/dL   Total Protein 7.3 6.5 - 8.1 g/dL   Albumin 3.0 (L) 3.5 - 5.0 g/dL   AST 68 (H) 15 - 41 U/L   ALT 55 (H) 0 - 44 U/L   Alkaline Phosphatase 52 38 - 126 U/L   Total Bilirubin 0.6 0.3 - 1.2 mg/dL   GFR, Estimated 40 (L) >60 mL/min    Comment: (NOTE) Calculated using the CKD-EPI Creatinine Equation (2021)    Anion gap 9 5 - 15    Comment: Performed at [facility], [address] [place], [place] [address]  CBC     Status: Abnormal   Collection Time: 05/11/20  5:44 AM  Result Value Ref Range   WBC 27.2 (H) 4.0 - 10.5 K/uL   RBC 3.82 (L) 4.22 - 5.81 MIL/uL   Hemoglobin 11.8 (L) 13.0 - 17.0 g/dL   HCT 88.4 39 - 52 %   MCV 112.0 (H) 80.0 - 100.0 fL   MCH 30.9 26.0 - 34.0 pg   MCHC 27.6 (L) 30.0 - 36.0 g/dL   RDW 16.6 06.3 - 01.6 %  Platelets 211 150 - 400 K/uL   nRBC 0.0 0.0 - 0.2 %    Comment: Performed at [facility], [address] [place], [place] [address]  Protime-INR     Status: Abnormal   Collection Time: 05/11/20  5:44 AM  Result Value Ref Range   Prothrombin Time 16.0 (H) 11.4 - 15.2 seconds   INR 1.3 (H) 0.8 - 1.2    Comment: (NOTE) INR goal varies based on device and disease states. Performed at [facility], [address] [place], [place] [address]   APTT     Status: None   Collection Time: 05/11/20  5:44 AM  Result Value Ref Range   aPTT 30 24 - 36 seconds    Comment: Performed at [facility], [address] [place], [place] [address]  Magnesium     Status: None   Collection Time: 05/11/20  5:44 AM  Result Value Ref Range   Magnesium 2.3  1.7 - 2.4 mg/dL    Comment: Performed at [facility], [address] [place], [place] [address]  Phosphorus     Status: Abnormal   Collection Time: 05/11/20  5:44 AM  Result Value Ref Range   Phosphorus 4.7 (H) 2.5 - 4.6 mg/dL    Comment: Performed at [facility], [address] [place], [place] [address]  Lactic acid, plasma     Status: None   Collection Time: 05/11/20  5:44 AM  Result Value Ref Range   Lactic Acid, Venous 1.6 0.5 - 1.9 mmol/L    Comment: Performed at [facility], [address] [place], [place] [address]  Glucose, capillary     Status: Abnormal   Collection Time: 05/11/20  7:32 AM  Result Value Ref Range   Glucose-Capillary 161 (H) 70 - 99 mg/dL    Comment: Glucose reference range applies only to samples taken after fasting for at least 8 hours.    DG Chest 1 View  Result Date: 05/10/2020 CLINICAL DATA:  Abdominal distension and positive blood cultures. EXAM: CHEST  1 VIEW COMPARISON:  May 10, 2020 (12:04 a.m.) FINDINGS: Decreased lung volumes are noted which is likely secondary to the degree of patient inspiration. Mild patchy opacities are again seen within the bilateral lung bases and along the lateral aspect of the mid right lung. This is unchanged in appearance when compared to the prior exam. The small pleural effusions seen on the prior study are not clearly identified on the current exam. The cardiac silhouette is unchanged in appearance. The visualized skeletal structures are unremarkable. IMPRESSION: Persistent mild patchy bilateral opacities. These are nonspecific and may be, in part, chronic in nature, however, superimposed acute infiltrate cannot be excluded. Electronically Signed   By: Aram Candelahaddeus  Houston M.D.   On: 05/10/2020 22:19   CT Angio Chest PE W/Cm &/Or Wo Cm  Result Date: 05/10/2020 CLINICAL DATA:  Shortness of breath with elevated D-dimer and hypoxia. Evaluate for pulmonary embolism. EXAM: CT ANGIOGRAPHY CHEST WITH CONTRAST TECHNIQUE:  Multidetector CT imaging of the chest was performed using the standard protocol during bolus administration of intravenous contrast. Multiplanar CT image reconstructions and MIPs were obtained to evaluate the vascular anatomy. CONTRAST:  100mL OMNIPAQUE IOHEXOL 350 MG/ML SOLN COMPARISON:  CT chest 09/08/2005 and CT abdomen/pelvis 05/09/2019 FINDINGS: Patient unable to communicate, move or speak or reply to questions as patient was restrained for the exam. Cardiovascular: Mild-to-moderate cardiomegaly is present. Moderate calcified plaque over the 3 vessel coronary arteries. Ectasia of the ascending thoracic aorta measuring 3.9  cm in AP diameter. Calcified plaque over the thoracic aorta. Pulmonary arterial system is well opacified. There is moderate artifact over the posterior field of view of the thorax right worse than left. There is mild low-attenuation over proximal right lower lobar artery which is likely due to this artifact although embolus is possible. Also mild low-attenuation over a posterior right upper lobe segmental pulmonary artery likely artifactual. No central or left-sided pulmonary emboli visualized. Mediastinum/Nodes: No evidence of hilar adenopathy. Single 1.3 cm right subcarinal lymph node likely reactive. Remaining mediastinal structures are unremarkable. Lungs/Pleura: Mild pleural calcification over the right apex. Lungs are adequately inflated and demonstrate bibasilar posterior consolidation likely atelectasis without significant change from the prior exam from 2020. No effusion. Severe AP narrowing of the trachea at the level of the carina with severe narrowing of the proximal mainstem bronchi bilaterally. Upper Abdomen: Calcified plaque over the abdominal aorta. Prominent ovoid air collection over the anterior aspect of the mid abdomen only partially visualized and unchanged from the previous CT abdomen representing a prominent segment of sigmoid colon. Musculoskeletal: Bilateral  gynecomastia. Moderate degenerative changes of the spine. Review of the MIP images confirms the above findings. IMPRESSION: 1. No definite evidence of acute pulmonary embolism. Low-attenuation over the proximal right lower lobar artery as well as a posterior right upper lobe segmental pulmonary artery likely artifactual. Concern still remains for pulmonary embolism, follow-up CT a in 24-48 hours may be helpful. 2. Stable bibasilar consolidation likely atelectasis and not significant changed from the prior exam. Severe AP narrowing of the trachea at the level of the carina with severe narrowing of the proximal mainstem bronchi bilaterally. 1.3 cm right subcarinal lymph node likely reactive. 3. Cardiomegaly and atherosclerotic coronary artery disease. 4. Aortic atherosclerosis. Ectasia of the ascending thoracic aorta measuring 3.9 cm in AP diameter. Recommend annual imaging followup by CTA or MRA. This recommendation follows 2010 ACCF/AHA/AATS/ACR/ASA/SCA/SCAI/SIR/STS/SVM Guidelines for the Diagnosis and Management of Patients with Thoracic Aortic Disease. Circulation.2010; 121: Z610-R604. Aortic aneurysm NOS (ICD10-I71.9) Aortic Atherosclerosis (ICD10-I70.0). Electronically Signed   By: Elberta Fortis M.D.   On: 05/10/2020 08:24   CT Abdomen Pelvis W Contrast  Result Date: 05/10/2020 CLINICAL DATA:  Abdominal distension and positive blood cultures. EXAM: CT ABDOMEN AND PELVIS WITH CONTRAST TECHNIQUE: Multidetector CT imaging of the abdomen and pelvis was performed using the standard protocol following bolus administration of intravenous contrast. CONTRAST:  75mL OMNIPAQUE IOHEXOL 300 MG/ML  SOLN COMPARISON:  May 09, 2019 FINDINGS: Lower chest: Stable moderate severity areas of consolidation are seen along the posterior aspect of the bilateral lung bases. Mild surrounding atelectatic changes are noted. Hepatobiliary: No focal liver abnormality is seen. Ill-defined subcentimeter gallstones are seen within the  lumen of a contracted gallbladder. There is no evidence of biliary dilatation. Pancreas: Unremarkable. No pancreatic ductal dilatation or surrounding inflammatory changes. Spleen: Normal in size without focal abnormality. Adrenals/Urinary Tract: Adrenal glands are unremarkable. Kidneys are normal in size, without renal calculi or hydronephrosis. Multiple ill-defined subcentimeter bilateral renal cysts are seen. Contrast is seen throughout the urinary bladder lumen. Stomach/Bowel: Stomach is within normal limits. Appendix appears normal. A large amount of stool is seen within the distal aspect of a redundant sigmoid colon. Dilatation of the distal sigmoid colon is seen proximal to this region. The numerous noninflamed diverticula are seen within the sigmoid colon. Vascular/Lymphatic: There is marked severity calcification of the abdominal aorta and bilateral common iliac arteries, without evidence of aneurysmal dilatation. No enlarged abdominal or pelvic lymph nodes. Reproductive:  Prostate is unremarkable. Other: No abdominal wall hernia or abnormality. No abdominopelvic ascites. Musculoskeletal: Multilevel degenerative changes seen throughout the lumbar spine. IMPRESSION: 1. Findings consistent with fecal impaction within the distal sigmoid colon with subsequent distal large bowel obstruction. 2. Stable moderate severity areas of bibasilar consolidation. 3. Cholelithiasis. 4. Sigmoid diverticulosis. Aortic Atherosclerosis (ICD10-I70.0). Electronically Signed   By: Aram Candela M.D.   On: 05/10/2020 23:20   DG Chest Portable 1 View  Result Date: 05/10/2020 CLINICAL DATA:  Shortness of breath. EXAM: PORTABLE CHEST 1 VIEW COMPARISON:  May 19, 2019 FINDINGS: Mild patchy opacities are seen within the bilateral lung bases and lateral aspect of the mid right lung. These areas are mildly increased in severity when compared to the prior study. Small bilateral pleural effusions are seen. No pneumothorax is  identified. The cardiac silhouette is mildly enlarged. The visualized skeletal structures are unremarkable. IMPRESSION: 1. Mild patchy bilateral lung opacities which are, in part, chronic in nature. A superimposed component of atelectasis and/or infiltrate cannot be excluded. Correlation with chest CT is recommended. 2. Small bilateral pleural effusions. Electronically Signed   By: Aram Candela M.D.   On: 05/10/2020 00:48    ROS:  Review of systems not obtained due to patient factors.  Blood pressure 115/70, pulse (!) 102, temperature 98.2 F (36.8 C), temperature source Oral, resp. rate 20, height 5\' 8"  (1.727 m), weight 111.6 kg, SpO2 99 %. Physical Exam: Black male with dysarthria. Head is normocephalic, atraumatic Lungs examination reveals coarse rhonchi bilaterally.  Upper respiratory secretions noted. Heart examination reveals a regular rate and rhythm without S3, S4, murmurs Abdomen is distended and tense.  No rigidity or tenderness is noted. Rectal examination not performed due to Flexi-Seal rectal tube in place.  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  He may have also had an episode of volvulus which has since resolved.  Patient is a high risk surgical candidate.  No need for surgical intervention at this time. Plan: Continue IV antibiotics.  Continue Flexi-Seal decompression.  May need a GI consultation for flexible sigmoidoscopy/endoscopic decompression.  Will reevaluate in a.m.  05/11/2020, 9:45 AM

## 2020-05-11 NOTE — Progress Notes (Signed)
PROGRESS NOTE    [name] [name]  ZTI:[id number] DOB: 04-11-1946 DOA: 05/10/2020 PCP: Sharee Holster, NP   Chief Complaint  Patient presents with  . 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 As per report: he was seen in the ED on late evening of 11/19 due to shortness of breath whereby he was requiring more supplemental oxygen and his oxygen was increased from 2 L/min to 3 L/min at the nursing facility, breathing treatment was also provided, but his O2 sats continued to be in the 80s, so he was taken to the ED for further evaluation.   In the ED, patient was suspected to have aspiration pneumonia due to his history of dysphagia and prior aspiration pneumonia, breathing treatment (albuterol and Atrovent) and small amount of white and occasionally yellow mucus were suctioned from the nares and throat, He was started on IV clindamycin.  CT scan done at that time does not reveal any specific abnormalities and patient had no leukocytosis, BNP was normal, respiratory panel was negative for influenza A, B and SARS coronavirus 2, so patient was discharged back to [facility] for a 7-day course of IV clindamycin. Blood culture drawn when patient came to the ED was positive for gram-positive cocci in one of the 2 cultures (possibly contaminated), but based on patient's clinical presentation, he was brought back to the emergency department. In the ED, he was febrile and tachycardic, LABS- leukocytosis, hypernatremia,, hyperglycemia, acute renal failure with BUN to  creatinine 41/1.65 (baseline creatinine at 1.0-1.2).  Lactic acidosis  2.6> 2.2.  UA unimpressive for UTI. CT abdomen and pelvis with contrast showed findings consistent with fecal impaction within the distal sigmoid colon with subsequent distal large bowel obstruction.  CT angiography of chest with contrast showed no definite evidence of acute PE, though still shows concern for PE.  CXR-persistent mild patchy bilateral opacities with possible superimposed acute infiltrate.  He was just tired and IV fluids, antibiotics and admitted for further management   Subjective: Seen this morning is alert oriented speech is difficult to comprehend with gurgly sound, he was able to tell me his name and that he was not having nausea or vomiting or any abdominal pain or distention.  He was just being cleaned.  Having urine output from condom catheter. Flexi-Seal in place with solid stool coming out, had lactulose enema just few hours ago.   Assessment & Plan:  Severe sepsis POA likely multifactorial with severe aspiration pneumonia also with large bowel obstruction.  Lactic acidosis improved, Blood pressure soft mildly tachycardic on 3 L nasal cannula and leukocytosis significantly worsening.  Continue on broad-spectrum antibiotics vancomycin/cefepime/Flagyl.  MRSA screen is positive, repeat blood culture pending  Significant abdominal distention with last bowel obstruction distal fecal impaction/history of chronic constipation: Abdomen distended although not much tender, denies nausea vomiting or abdominal pain-however he is not a very good historian.  We will continue on enema, monitor serial imaging, I have consulted Dr. Lovell Sheehan from general surgery for further recommendation.  Recent blood culture + in aerobic bottle only - staph in BCID 05/10/20 am- f/u culture,?contamination  Acute on chronic hypoxic respiratory failure from combination  of aspiration pneumonia/abdominal distention: On 4 L nasal cannula at  baseline 2 L.  Continue supplemental oxygen, continue pulmonary support, aspiration precaution n.p.o.  Acute renal failure,baseline creatinine at 1.0-1.2: Likely from sepsis/bowel obstruction.  Worsening creatinine.  Continue aggressive fluid hydration.  Avoid nephrotoxic medication.  Hyperkalemia at 5.3, will order kayexalate if still high on recheck this afternoon-he just got rectal enema hopefully it will help  History of recurrent DVT/PE on Eliquis at baseline.  Currently n.p.o. and switched to Lovenox.  Hyperthyroidism on methimazole, resume once able to take p.o.  Hyperlipidemia/hypertension: PO meds remains on hold, blood pressure soft.  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: Keep n.p.o., holding p.o. meds  Hypernatremia: Monitor electrolytes and allergist.  Accordingly.  Likely from free water deficit in the setting of dysphagia/poor intake.   Hyperglycemia due to diabetes mellitus: HbA1c is still 7.9.  Continue sliding scale insulin monitor CBG. Recent Labs  Lab 05/11/20 0343 05/11/20 0732  GLUCAP 158* 161*   Morbid obesity with Body mass index is 37.41 kg/m.  Not sure if it is physical for him to lose weight.  GUR:KYHCW w/ his brother who states he is a Product manager.Prognosis remains guarded at this time patient is DNR.  Will consult palliative care.  Is at risk of decompensation in the setting of severe sepsis, abdominal distention bowel obstruction along with hypoxic respiratory failure and aspiration. Spoke to his brother,he had PEG tube and got better w/ eating and was removed past year or so.  Nutrition: Diet Order            Diet NPO time specified  Diet effective now                 DVT prophylaxis: SCDs Start: 05/11/20 0142 Code Status:   Code Status: DNR  Family Communication: plan of care discussed with patient's brother   And bedside RN.  Status is: Inpatient. Remains inpatient appropriate because:IV treatments appropriate  due to intensity of illness or inability to take PO and Inpatient level of care appropriate due to severity of illness  Dispo: The patient is from: SNF              Anticipated d/c is to: SNF              Anticipated d/c date is: > 3 days              Patient currently is not medically stable to d/c. Consultants:see note  Procedures:see note  Culture/Microbiology    Component Value Date/Time   SDES LEFT ANTECUBITAL 05/10/2020 2230   SPECREQUEST  05/10/2020 2230    BOTTLES DRAWN AEROBIC AND ANAEROBIC Blood Culture results may not be optimal due to an inadequate volume of blood received in culture bottles   CULT  05/10/2020 2230    NO GROWTH < 12 HOURS Performed at [facility], [address] [place], [place] [address]    REPTSTATUS PENDING 05/10/2020 2230    Other culture-see note  Medications: Scheduled Meds: . Chlorhexidine Gluconate Cloth  6 each Topical Q0600  . enoxaparin (LOVENOX) injection  110 mg Subcutaneous Q12H  . insulin aspart  0-9 Units Subcutaneous Q4H  . mupirocin ointment  1 application Nasal BID   Continuous Infusions: . sodium chloride 125 mL/hr at 05/11/20 0804  . ceFEPime (MAXIPIME) IV    . metronidazole 500 mg (05/11/20 0411)  . vancomycin      Antimicrobials: Anti-infectives (From admission, onward)   Start  Dose/Rate Route Frequency Ordered Stop   05/11/20 2200  vancomycin (VANCOREADY) IVPB 1250 mg/250 mL        1,250 mg 166.7 mL/hr over 90 Minutes Intravenous Every 24 hours 05/11/20 0316     05/11/20 1000  ceFEPIme (MAXIPIME) 2 g in sodium chloride 0.9 % 100 mL IVPB        2 g 200 mL/hr over 30 Minutes Intravenous Every 12 hours 05/11/20 0316     05/11/20 0300  metroNIDAZOLE (FLAGYL) IVPB 500 mg        500 mg 100 mL/hr over 60 Minutes Intravenous Every 8 hours 05/11/20 0214 05/18/20 0259   05/10/20 2215  aztreonam (AZACTAM) 2 g in sodium chloride 0.9 % 100 mL IVPB  Status:  Discontinued        2 g 200 mL/hr over 30 Minutes Intravenous   Once 05/10/20 2205 05/10/20 2211   05/10/20 2215  vancomycin (VANCOCIN) IVPB 1000 mg/200 mL premix  Status:  Discontinued        1,000 mg 200 mL/hr over 60 Minutes Intravenous  Once 05/10/20 2205 05/10/20 2207   05/10/20 2215  vancomycin (VANCOREADY) IVPB 2000 mg/400 mL        2,000 mg 200 mL/hr over 120 Minutes Intravenous  Once 05/10/20 2207 05/11/20 0050   05/10/20 2215  ceFEPIme (MAXIPIME) 2 g in sodium chloride 0.9 % 100 mL IVPB        2 g 200 mL/hr over 30 Minutes Intravenous  Once 05/10/20 2211 05/10/20 2328     Objective: Vitals: Today's Vitals   05/11/20 0020 05/11/20 0030 05/11/20 0141 05/11/20 0142  BP:  99/63 115/70   Pulse:  (!) 107 (!) 102   Resp:  15 20   Temp: 98 F (36.7 C)  98.2 F (36.8 C)   TempSrc: Oral  Oral   SpO2:  96% 100% 99%  Weight:      Height:       No intake or output data in the 24 hours ending 05/11/20 0929 Filed Weights   05/10/20 2122  Weight: 111.6 kg   Weight change:   Intake/Output from previous day: No intake/output data recorded. Intake/Output this shift: No intake/output data recorded.  Examination: General exam: AAOX2, older for his age, chronically sick looking.   HEENT:Oral mucosa moist, Ear/Nose WNL grossly,dentition normal. Respiratory system: bilaterally coarse crackles and extensiVE, ON 4L [place]. Cardiovascular system: S1 & S2 +, regular, No JVD. Gastrointestinal system: Abdomen soft, with significant distention, non tender. old scar from PEG tube Nervous System:Alert, awake, rt sided weakness Extremities: No edema, distal peripheral pulses palpable.  Skin: No rashes,no icterus. MSK: Normal muscle bulk,tone, power  Data Reviewed: I have personally reviewed following labs and imaging studies CBC: Recent Labs  Lab 05/10/20 0121 05/10/20 2141 05/11/20 0544  WBC 9.6 18.8* 27.2*  NEUTROABS 6.3 17.5*  --   HGB 12.4* 12.4* 11.8*  HCT 43.6 44.8 42.8  MCV 106.3* 110.1* 112.0*  PLT 235 229 211   Basic Metabolic  Panel: Recent Labs  Lab 05/10/20 0121 05/10/20 2141 05/11/20 0544  NA 145 147* 148*  K 4.8 4.4 5.3*  CL 101 102 103  CO2 38* 38* 36*  GLUCOSE 294* 246* 173*  BUN 36* 41* 42*  CREATININE 1.45* 1.65* 1.78*  CALCIUM 8.7* 8.5* 8.3*  MG  --   --  2.3  PHOS  --   --  4.7*   GFR: Estimated Creatinine Clearance: 44.1 mL/min (A) (by C-G formula based on SCr of  1.78 mg/dL (H)). Liver Function Tests: Recent Labs  Lab 05/10/20 0121 05/11/20 0544  AST 19 68*  ALT 18 55*  ALKPHOS 51 52  BILITOT 0.3 0.6  PROT 7.7 7.3  ALBUMIN 3.2* 3.0*   No results for input(s): LIPASE, AMYLASE in the last 168 hours. No results for input(s): AMMONIA in the last 168 hours. Coagulation Profile: Recent Labs  Lab 05/10/20 2141 05/11/20 0544  INR 1.3* 1.3*   Cardiac Enzymes: No results for input(s): CKTOTAL, CKMB, CKMBINDEX, TROPONINI in the last 168 hours. BNP (last 3 results) No results for input(s): PROBNP in the last 8760 hours. HbA1C: No results for input(s): HGBA1C in the last 72 hours. CBG: Recent Labs  Lab 05/11/20 0343 05/11/20 0732  GLUCAP 158* 161*   Lipid Profile: No results for input(s): CHOL, HDL, LDLCALC, TRIG, CHOLHDL, LDLDIRECT in the last 72 hours. Thyroid Function Tests: No results for input(s): TSH, T4TOTAL, FREET4, T3FREE, THYROIDAB in the last 72 hours. Anemia Panel: No results for input(s): VITAMINB12, FOLATE, FERRITIN, TIBC, IRON, RETICCTPCT in the last 72 hours. Sepsis Labs: Recent Labs  Lab 05/10/20 2141 05/10/20 2320 05/11/20 0544  LATICACIDVEN 2.6* 2.2* 1.6    Recent Results (from the past 240 hour(s))  Resp Panel by RT-PCR (Flu A&B, Covid) Nasopharyngeal Swab     Status: None   Collection Time: 05/09/20 11:59 PM   Specimen: Nasopharyngeal Swab; Nasopharyngeal(NP) swabs in vial transport medium  Result Value Ref Range Status   SARS Coronavirus 2 by RT PCR NEGATIVE NEGATIVE Final    Comment: (NOTE) SARS-CoV-2 target nucleic acids are NOT  DETECTED.  The SARS-CoV-2 RNA is generally detectable in upper respiratory specimens during the acute phase of infection. The lowest concentration of SARS-CoV-2 viral copies this assay can detect is 138 copies/mL. A negative result does not preclude SARS-Cov-2 infection and should not be used as the sole basis for treatment or other patient management decisions. A negative result may occur with  improper specimen collection/handling, submission of specimen other than nasopharyngeal swab, presence of viral mutation(s) within the areas targeted by this assay, and inadequate number of viral copies(<138 copies/mL). A negative result must be combined with clinical observations, patient history, and epidemiological information. The expected result is Negative.  Fact Sheet for Patients:  BloggerCourse.com  Fact Sheet for Healthcare Providers:  SeriousBroker.it  This test is no t yet approved or cleared by the [place] FDA and  has been authorized for detection and/or diagnosis of SARS-CoV-2 by FDA under an Emergency Use Authorization (EUA). This EUA will remain  in effect (meaning this test can be used) for the duration of the COVID-19 declaration under Section 564(b)(1) of the Act, 21 U.S.C.section 360bbb-3(b)(1), unless the authorization is terminated  or revoked sooner.       Influenza A by PCR NEGATIVE NEGATIVE Final   Influenza B by PCR NEGATIVE NEGATIVE Final    Comment: (NOTE) The Xpert Xpress SARS-CoV-2/FLU/RSV plus assay is intended as an aid in the diagnosis of influenza from Nasopharyngeal swab specimens and should not be used as a sole basis for treatment. Nasal washings and aspirates are unacceptable for Xpert Xpress SARS-CoV-2/FLU/RSV testing.  Fact Sheet for Patients: BloggerCourse.com  Fact Sheet for Healthcare Providers: SeriousBroker.it  This test is not yet  approved or cleared by the [place] FDA and has been authorized for detection and/or diagnosis of SARS-CoV-2 by FDA under an Emergency Use Authorization (EUA). This EUA will remain in effect (meaning this test can be used) for the duration  of the COVID-19 declaration under Section 564(b)(1) of the Act, 21 U.S.C. section 360bbb-3(b)(1), unless the authorization is terminated or revoked.  Performed at [facility], [address] [place], [place] [address]   Culture, blood (routine x 2)     Status: None (Preliminary result)   Collection Time: 05/10/20  1:21 AM   Specimen: BLOOD RIGHT HAND  Result Value Ref Range Status   Specimen Description   Final    BLOOD RIGHT HAND Performed at [facility], [address], [place], [place] [address]    Special Requests   Final    BOTTLES DRAWN AEROBIC AND ANAEROBIC Blood Culture adequate volume Performed at [facility], [address] [place], [place] [address]    Culture  Setup Time   Final    GRAM POSITIVE COCCI Gram Stain Report Called to,Read Back By and Verified With: WATLINGTON,C @ 2014 ON 05/10/20 BY JUW ANAEROBIC BOTTLE ONLY GS DONE @ [facility] CRITICAL RESULT CALLED TO, READ BACK BY AND VERIFIED WITH: Dellia Nims 9147 05/11/2020 Girtha Hake Performed at [facility], [address] [place], [place] [address]    Culture GRAM POSITIVE COCCI  Final   Report Status PENDING  Incomplete  Blood Culture ID Panel (Reflexed)     Status: Abnormal   Collection Time: 05/10/20  1:21 AM  Result Value Ref Range Status   Enterococcus faecalis NOT DETECTED NOT DETECTED Final   Enterococcus Faecium NOT DETECTED NOT DETECTED Final   Listeria monocytogenes NOT DETECTED NOT DETECTED Final   Staphylococcus species DETECTED (A) NOT DETECTED Final    Comment: CRITICAL RESULT CALLED TO, READ BACK BY AND VERIFIED WITH: CLuiz Ochoa 2130 05/11/2020 T. TYSOR    Staphylococcus aureus (BCID) NOT DETECTED NOT DETECTED Final   Staphylococcus epidermidis NOT  DETECTED NOT DETECTED Final   Staphylococcus lugdunensis NOT DETECTED NOT DETECTED Final   Streptococcus species NOT DETECTED NOT DETECTED Final   Streptococcus agalactiae NOT DETECTED NOT DETECTED Final   Streptococcus pneumoniae NOT DETECTED NOT DETECTED Final   Streptococcus pyogenes NOT DETECTED NOT DETECTED Final   A.calcoaceticus-baumannii NOT DETECTED NOT DETECTED Final   Bacteroides fragilis NOT DETECTED NOT DETECTED Final   Enterobacterales NOT DETECTED NOT DETECTED Final   Enterobacter cloacae complex NOT DETECTED NOT DETECTED Final   Escherichia coli NOT DETECTED NOT DETECTED Final   Klebsiella aerogenes NOT DETECTED NOT DETECTED Final   Klebsiella oxytoca NOT DETECTED NOT DETECTED Final   Klebsiella pneumoniae NOT DETECTED NOT DETECTED Final   Proteus species NOT DETECTED NOT DETECTED Final   Salmonella species NOT DETECTED NOT DETECTED Final   Serratia marcescens NOT DETECTED NOT DETECTED Final   Haemophilus influenzae NOT DETECTED NOT DETECTED Final   Neisseria meningitidis NOT DETECTED NOT DETECTED Final   Pseudomonas aeruginosa NOT DETECTED NOT DETECTED Final   Stenotrophomonas maltophilia NOT DETECTED NOT DETECTED Final   Candida albicans NOT DETECTED NOT DETECTED Final   Candida auris NOT DETECTED NOT DETECTED Final   Candida glabrata NOT DETECTED NOT DETECTED Final   Candida krusei NOT DETECTED NOT DETECTED Final   Candida parapsilosis NOT DETECTED NOT DETECTED Final   Candida tropicalis NOT DETECTED NOT DETECTED Final   Cryptococcus neoformans/gattii NOT DETECTED NOT DETECTED Final    Comment: Performed at [facility], [address], [place], [place] [address]  Culture, blood (routine x 2)     Status: None (Preliminary result)   Collection Time: 05/10/20  1:29 AM   Specimen: BLOOD LEFT HAND  Result  Value Ref Range Status   Specimen Description   Final    BLOOD LEFT HAND Performed at [facility], [address], [place], [place] [address]    Special  Requests   Final    BOTTLES DRAWN AEROBIC AND ANAEROBIC Blood Culture adequate volume Performed at [facility], [address] [place], [place] [address]    Culture  Setup Time   Final    GRAM POSITIVE COCCI Gram Stain Report Called to,Read Back By and Verified With: TURNER,C @ 2333 ON 05/10/20 BY JUW ANAEROBIC BOTTLE ONLY GS DONE @ [facility] CRITICAL VALUE NOTED.  VALUE IS CONSISTENT WITH PREVIOUSLY REPORTED AND CALLED VALUE. Performed at [facility], [address], [place], [place] [address]    Culture GRAM POSITIVE COCCI  Final   Report Status PENDING  Incomplete  Culture, blood (Routine X 2) w Reflex to ID Panel     Status: None (Preliminary result)   Collection Time: 05/10/20  9:41 PM   Specimen: Right Antecubital; Blood  Result Value Ref Range Status   Specimen Description RIGHT ANTECUBITAL  Final   Special Requests   Final    BOTTLES DRAWN AEROBIC AND ANAEROBIC Blood Culture adequate volume   Culture   Final    NO GROWTH < 12 HOURS Performed at [facility], [address], [place], [place] [address]    Report Status PENDING  Incomplete  Culture, blood (Routine X 2) w Reflex to ID Panel     Status: None (Preliminary result)   Collection Time: 05/10/20 10:30 PM   Specimen: Left Antecubital; Blood  Result Value Ref Range Status   Specimen Description LEFT ANTECUBITAL  Final   Special Requests   Final    BOTTLES DRAWN AEROBIC AND ANAEROBIC Blood Culture results may not be optimal due to an inadequate volume of blood received in culture bottles   Culture   Final    NO GROWTH < 12 HOURS Performed at [facility], [address], [place], [place] [address]    Report Status PENDING  Incomplete  MRSA PCR Screening     Status: Abnormal   Collection Time: 05/11/20  2:14 AM   Specimen: Nasal Mucosa; Nasopharyngeal  Result Value Ref Range Status   MRSA by PCR POSITIVE (A) NEGATIVE Final    Comment:        The GeneXpert MRSA Assay (FDA approved for NASAL specimens only), is  one component of a comprehensive MRSA colonization surveillance program. It is not intended to diagnose MRSA infection nor to guide or monitor treatment for MRSA infections. RESULT CALLED TO, READ BACK BY AND VERIFIED WITH: SINSKIE,P @ 0514 ON 05/11/20 BY JUW Performed at [facility], [address], [place], [place] [address]      Radiology Studies: DG Chest 1 View  Result Date: 05/10/2020 CLINICAL DATA:  Abdominal distension and positive blood cultures. EXAM: CHEST  1 VIEW COMPARISON:  May 10, 2020 (12:04 a.m.) FINDINGS: Decreased lung volumes are noted which is likely secondary to the degree of patient inspiration. Mild patchy opacities are again seen within the bilateral lung bases and along the lateral aspect of the mid right lung. This is unchanged in appearance when compared to the prior exam. The small pleural effusions seen on the prior study are not clearly identified on the current exam. The cardiac silhouette is unchanged in appearance. The visualized skeletal structures are unremarkable. IMPRESSION: Persistent mild patchy bilateral opacities. These are nonspecific and may be, in part, chronic in nature, however,  superimposed acute infiltrate cannot be excluded. Electronically Signed   By: Aram Candela M.D.   On: 05/10/2020 22:19   CT Angio Chest PE W/Cm &/Or Wo Cm  Result Date: 05/10/2020 CLINICAL DATA:  Shortness of breath with elevated D-dimer and hypoxia. Evaluate for pulmonary embolism. EXAM: CT ANGIOGRAPHY CHEST WITH CONTRAST TECHNIQUE: Multidetector CT imaging of the chest was performed using the standard protocol during bolus administration of intravenous contrast. Multiplanar CT image reconstructions and MIPs were obtained to evaluate the vascular anatomy. CONTRAST:  OMNIPAQUE IOHEXOL 350 MG/ML SOLN COMPARISON:  CT chest 09/08/2005 and CT abdomen/pelvis 05/09/2019 FINDINGS: Patient unable to communicate, move or speak or reply to questions as patient was  restrained for the exam. Cardiovascular: Mild-to-moderate cardiomegaly is present. Moderate calcified plaque over the 3 vessel coronary arteries. Ectasia of the ascending thoracic aorta measuring 3.9 cm in AP diameter. Calcified plaque over the thoracic aorta. Pulmonary arterial system is well opacified. There is moderate artifact over the posterior field of view of the thorax right worse than left. There is mild low-attenuation over proximal right lower lobar artery which is likely due to this artifact although embolus is possible. Also mild low-attenuation over a posterior right upper lobe segmental pulmonary artery likely artifactual. No central or left-sided pulmonary emboli visualized. Mediastinum/Nodes: No evidence of hilar adenopathy. Single 1.3 cm right subcarinal lymph node likely reactive. Remaining mediastinal structures are unremarkable. Lungs/Pleura: Mild pleural calcification over the right apex. Lungs are adequately inflated and demonstrate bibasilar posterior consolidation likely atelectasis without significant change from the prior exam from 2020. No effusion. Severe AP narrowing of the trachea at the level of the carina with severe narrowing of the proximal mainstem bronchi bilaterally. Upper Abdomen: Calcified plaque over the abdominal aorta. Prominent ovoid air collection over the anterior aspect of the mid abdomen only partially visualized and unchanged from the previous CT abdomen representing a prominent segment of sigmoid colon. Musculoskeletal: Bilateral gynecomastia. Moderate degenerative changes of the spine. Review of the MIP images confirms the above findings. IMPRESSION: 1. No definite evidence of acute pulmonary embolism. Low-attenuation over the proximal right lower lobar artery as well as a posterior right upper lobe segmental pulmonary artery likely artifactual. Concern still remains for pulmonary embolism, follow-up CT a in 24-48 hours may be helpful. 2. Stable bibasilar  consolidation likely atelectasis and not significant changed from the prior exam. Severe AP narrowing of the trachea at the level of the carina with severe narrowing of the proximal mainstem bronchi bilaterally. 1.3 cm right subcarinal lymph node likely reactive. 3. Cardiomegaly and atherosclerotic coronary artery disease. 4. Aortic atherosclerosis. Ectasia of the ascending thoracic aorta measuring 3.9 cm in AP diameter. Recommend annual imaging followup by CTA or MRA. This recommendation follows 2010 ACCF/AHA/AATS/ACR/ASA/SCA/SCAI/SIR/STS/SVM Guidelines for the Diagnosis and Management of Patients with Thoracic Aortic Disease. Circulation.2010; 121: O536-U440. Aortic aneurysm NOS (ICD10-I71.9) Aortic Atherosclerosis (ICD10-I70.0). Electronically Signed   By: Elberta Fortis M.D.   On: 05/10/2020 08:24   CT Abdomen Pelvis W Contrast  Result Date: 05/10/2020 CLINICAL DATA:  Abdominal distension and positive blood cultures. EXAM: CT ABDOMEN AND PELVIS WITH CONTRAST TECHNIQUE: Multidetector CT imaging of the abdomen and pelvis was performed using the standard protocol following bolus administration of intravenous contrast. CONTRAST:  75mL OMNIPAQUE IOHEXOL 300 MG/ML  SOLN COMPARISON:  May 09, 2019 FINDINGS: Lower chest: Stable moderate severity areas of consolidation are seen along the posterior aspect of the bilateral lung bases. Mild surrounding atelectatic changes are noted. Hepatobiliary: No focal liver abnormality  is seen. Ill-defined subcentimeter gallstones are seen within the lumen of a contracted gallbladder. There is no evidence of biliary dilatation. Pancreas: Unremarkable. No pancreatic ductal dilatation or surrounding inflammatory changes. Spleen: Normal in size without focal abnormality. Adrenals/Urinary Tract: Adrenal glands are unremarkable. Kidneys are normal in size, without renal calculi or hydronephrosis. Multiple ill-defined subcentimeter bilateral renal cysts are seen. Contrast is seen  throughout the urinary bladder lumen. Stomach/Bowel: Stomach is within normal limits. Appendix appears normal. A large amount of stool is seen within the distal aspect of a redundant sigmoid colon. Dilatation of the distal sigmoid colon is seen proximal to this region. The numerous noninflamed diverticula are seen within the sigmoid colon. Vascular/Lymphatic: There is marked severity calcification of the abdominal aorta and bilateral common iliac arteries, without evidence of aneurysmal dilatation. No enlarged abdominal or pelvic lymph nodes. Reproductive: Prostate is unremarkable. Other: No abdominal wall hernia or abnormality. No abdominopelvic ascites. Musculoskeletal: Multilevel degenerative changes seen throughout the lumbar spine. IMPRESSION: 1. Findings consistent with fecal impaction within the distal sigmoid colon with subsequent distal large bowel obstruction. 2. Stable moderate severity areas of bibasilar consolidation. 3. Cholelithiasis. 4. Sigmoid diverticulosis. Aortic Atherosclerosis (ICD10-I70.0). Electronically Signed   By: Aram Candela M.D.   On: 05/10/2020 23:20   DG Chest Portable 1 View  Result Date: 05/10/2020 CLINICAL DATA:  Shortness of breath. EXAM: PORTABLE CHEST 1 VIEW COMPARISON:  May 19, 2019 FINDINGS: Mild patchy opacities are seen within the bilateral lung bases and lateral aspect of the mid right lung. These areas are mildly increased in severity when compared to the prior study. Small bilateral pleural effusions are seen. No pneumothorax is identified. The cardiac silhouette is mildly enlarged. The visualized skeletal structures are unremarkable. IMPRESSION: 1. Mild patchy bilateral lung opacities which are, in part, chronic in nature. A superimposed component of atelectasis and/or infiltrate cannot be excluded. Correlation with chest CT is recommended. 2. Small bilateral pleural effusions. Electronically Signed   By: Aram Candela M.D.   On: 05/10/2020 00:48     LOS: 0 days    Time spent 42 minutes in the care of this patient  Lanae Boast, MD [facility]  05/11/2020, 9:29 AM

## 2020-05-11 NOTE — H&P (Signed)
History and Physical  EUAL LINDSTROM MLY:[id number] DOB: 08-Apr-1946 DOA: 05/10/2020  Referring physician: Devoria Albe, MD PCP: Sharee Holster, NP  Patient coming from: [facility]  Chief Complaint: Positive blood culture  HPI: [name] [name] is a 74 y.o. 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  Patient was seen in the ED on late evening of 11/19 due to shortness of breath whereby he was requiring more supplemental oxygen and his oxygen was increased from 2 L/min to 3 L/min at the nursing facility, breathing treatment was also provided, but his O2 sats continued to be in the 80s, so he was taken to the ED for further evaluation.   In the ED, patient was suspected to have aspiration pneumonia due to his history of dysphagia and prior aspiration pneumonia, breathing treatment (albuterol and Atrovent) and small amount of white and occasionally yellow mucus were suctioned from the nares and throat, He was started on IV clindamycin.  CT scan done at that time does not reveal any specific abnormalities and patient had no leukocytosis, BNP was normal, respiratory panel was negative for influenza A, B and SARS coronavirus 2, so patient was discharged back to [facility] for a 7-day course of IV clindamycin. Blood culture drawn when patient came to the ED was positive for gram-positive cocci in one of the 2 cultures (possibly contaminated), but based on patient's clinical presentation, he was brought back to the emergency department.  ED Course:  In the emergency department, he was febrile and tachycardic.  Work-up in the ED showed leukocytosis, hypernatremia,, hyperglycemia, BUN to  creatinine 41/1.65 (baseline creatinine at 1.0-1.2).  Lactic acid 2.6> 2.2.  Urinalysis was unimpressive for UTI.  CT abdomen and pelvis with contrast showed findings consistent with fecal impaction within the distal sigmoid colon with subsequent distal large bowel obstruction.  CT angiography of chest with contrast showed no definite evidence of acute PE, though still shows concern for PE.  Chest x-ray showed persistent mild patchy bilateral opacities with possible superimposed acute infiltrate.  He was treated with IV cefepime and vancomycin.  Tylenol was given due to fever.  IV hydration was provided.  Hospitalist was asked to admit patient for further evaluation and management.   Review of Systems: Review of system cannot be obtained at this time due to patient's current condition  Past Medical History:  Diagnosis Date  . DVT (deep venous thrombosis) (HCC) 09/26/2012  . Dysphasia   . Fatty liver   . Hemiplegia (HCC)   . Hypertension   . Pneumonia   . Pulmonary embolism (HCC)   . 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(HCC)   . Stroke [facility])    Past Surgical History:  Procedure Laterality Date  . ESOPHAGOGASTRODUODENOSCOPY (EGD) WITH PROPOFOL N/A 05/15/2019   Procedure: ESOPHAGOGASTRODUODENOSCOPY (EGD) WITH PROPOFOL;  Surgeon: Lucretia Roers, MD;  Location: [facility];  Service: General;  Laterality: N/A;  . FLEXIBLE SIGMOIDOSCOPY N/A 10/21/2016   Procedure: FLEXIBLE SIGMOIDOSCOPY;  Surgeon: Malissa Hippo, MD;  Location: [facility];  Service: Endoscopy;  Laterality: N/A;  . FLEXIBLE SIGMOIDOSCOPY N/A 10/25/2016   Procedure: FLEXIBLE SIGMOIDOSCOPY;  Surgeon: Malissa Hippo, MD;  Location: [facility];  Service: Endoscopy;  Laterality: N/A;  . PEG PLACEMENT N/A 05/15/2019  Procedure: PERCUTANEOUS ENDOSCOPIC GASTROSTOMY (PEG) PLACEMENT;  Surgeon: Lucretia RoersBridges, Lindsay C, MD;  Location: [facility] OR[facility];  Service: General;  Laterality: N/A;  . unable      Social History:  reports that he has never smoked. He has never  used smokeless tobacco. He reports that he does not drink alcohol and does not use drugs.   Allergies  Allergen Reactions  . Penicillin G   . Penicillins     Has patient had a PCN reaction causing immediate rash, facial/tongue/throat swelling, SOB or lightheadedness with hypotension: unknown Has patient had a PCN reaction causing severe rash involving mucus membranes or skin necrosis: unknown Has patient had a PCN reaction that required hospitalization: unknown Has patient had a PCN reaction occurring within the last 10 years: unknown If all of the above answers are "NO", then may proceed with Cephalosporin use. 5/3 Tolerated Cefepime x 1 dose in ED.     No family history on file.   Prior to Admission medications   Medication Sig Start Date End Date Taking? Authorizing Provider  acetaminophen (TYLENOL) 325 MG tablet Take 650 mg by mouth every 6 (six) hours as needed for moderate pain.     [provider]  apixaban (ELIQUIS) 5 MG TABS tablet Place 5 mg into feeding tube 2 (two) times daily. For dvt/pe/cva po    [provider]  atorvastatin (LIPITOR) 10 MG tablet Take 10 mg by mouth daily.    [provider]  atropine 1 % ophthalmic solution Place 1 drop under the tongue every 6 (six) hours as needed (excessive oral secretions).     [provider]  bisacodyl (DULCOLAX) 10 MG suppository Place 1 suppository (10 mg total) rectally at bedtime. 05/23/19   Sherryll BurgerShah, Pratik D, DO  carboxymethylcellulose (REFRESH PLUS) 0.5 % SOLN Place 1 drop into the right eye in the morning, at noon, in the evening, and at bedtime.     [provider]  clindamycin 600 mg in dextrose 5 % 100 mL Inject 600 mg into the vein every 8 (eight) hours for 7 days. 05/10/20 05/17/20  Eber HongMiller, Brian, MD  erythromycin ophthalmic ointment Place 1 application into the right eye 2 (two) times daily. Also apply to right eye - dx corneal scar - continue until next ophthalmology visit At  Bedtime 04/26/20   [provider]  insulin aspart (NOVOLOG FLEXPEN) 100 UNIT/ML FlexPen Inject 15 Units into the skin 2 (two) times daily with a meal. With Lunch and Supper 04/07/20   [provider]  Insulin Glargine (BASAGLAR KWIKPEN) 100 UNIT/ML SOPN Inject 31 Units into the skin at bedtime.  04/07/20   [provider]  ipratropium-albuterol (DUONEB) 0.5-2.5 (3) MG/3ML SOLN Take 3 mLs by nebulization every 6 (six) hours.  10/02/19   [provider]  labetalol (NORMODYNE) 200 MG tablet Take 200 mg by mouth daily. For HTN    [provider]  lactulose (CHRONULAC) 10 GM/15ML solution Give 30 cc by mouth twice a day 11/13/19   [provider]  lisinopril (ZESTRIL) 2.5 MG tablet Take 2.5 mg by mouth daily. hold for systolic < 110    [provider]  loratadine (CLARITIN) 10 MG tablet Take 10 mg by mouth daily.    [provider]  methimazole (TAPAZOLE) 5 MG tablet Take 2.5 mg by mouth daily. 11/28/18   [provider]  NON FORMULARY Diet- Liquids: Nectar Thick Liquids  Diet:Dysphagia 1 Puree    [provider]  OXYGEN Inhale 2  L into the lungs continuous. Every shift to maintain satuation >90%.    [provider]  sennosides (SENOKOT) 8.8 MG/5ML syrup Take 5 mLs by mouth 2 (two) times daily as needed for mild constipation. 06/11/19   [provider]  Vitamins A & D (VITAMIN A & D) ointment Apply 1 application topically as needed for dry skin. To left heel qshift for prevention. 11/08/19   [provider]    Physical Exam: BP 115/70 (BP Location: Left Wrist)   Pulse (!) 102   Temp 98.2 F (36.8 C) (Oral)   Resp 20   Ht 5\' 8"  (1.727 m)   Wt 111.6 kg   SpO2 100%   BMI 37.41 kg/m   . General: 74 y.o. year-old male well developed well nourished in no acute distress.  Alert and oriented x3. 66 HEENT: NCAT . Neck: Supple, trachea medial . Cardiovascular: Tachycardia.  Regular rate and  rhythm with no rubs or gallops.  No thyromegaly or JVD noted. 2/4 pulses in all 4 extremities. Marland Kitchen Respiratory: Coarse breath sounds with rhonchi and rales worse in lower lobes.   . Abdomen: Abdomen distended, nontender . Muskuloskeletal: No cyanosis, clubbing or edema noted bilaterally . Neuro: CN II-XII intact, strength, sensation, reflexes . Skin: No ulcerative lesions noted or rashes . Psychiatry: Judgement and insight appear normal. Mood is appropriate for condition and setting          Labs on Admission:  Basic Metabolic Panel: Recent Labs  Lab 05/10/20 0121 05/10/20 2141  NA 145 147*  K 4.8 4.4  CL 101 102  CO2 38* 38*  GLUCOSE 294* 246*  BUN 36* 41*  CREATININE 1.45* 1.65*  CALCIUM 8.7* 8.5*   Liver Function Tests: Recent Labs  Lab 05/10/20 0121  AST 19  ALT 18  ALKPHOS 51  BILITOT 0.3  PROT 7.7  ALBUMIN 3.2*   No results for input(s): LIPASE, AMYLASE in the last 168 hours. No results for input(s): AMMONIA in the last 168 hours. CBC: Recent Labs  Lab 05/10/20 0121 05/10/20 2141  WBC 9.6 18.8*  NEUTROABS 6.3 17.5*  HGB 12.4* 12.4*  HCT 43.6 44.8  MCV 106.3* 110.1*  PLT 235 229   Cardiac Enzymes: No results for input(s): CKTOTAL, CKMB, CKMBINDEX, TROPONINI in the last 168 hours.  BNP (last 3 results) Recent Labs    05/10/20 0121  BNP 40.0    ProBNP (last 3 results) No results for input(s): PROBNP in the last 8760 hours.  CBG: No results for input(s): GLUCAP in the last 168 hours.  Radiological Exams on Admission: DG Chest 1 View  Result Date: 05/10/2020 CLINICAL DATA:  Abdominal distension and positive blood cultures. EXAM: CHEST  1 VIEW COMPARISON:  May 10, 2020 (12:04 a.m.) FINDINGS: Decreased lung volumes are noted which is likely secondary to the degree of patient inspiration. Mild patchy opacities are again seen within the bilateral lung bases and along the lateral aspect of the mid right lung. This is unchanged in appearance when  compared to the prior exam. The small pleural effusions seen on the prior study are not clearly identified on the current exam. The cardiac silhouette is unchanged in appearance. The visualized skeletal structures are unremarkable. IMPRESSION: Persistent mild patchy bilateral opacities. These are nonspecific and may be, in part, chronic in nature, however, superimposed acute infiltrate cannot be excluded. Electronically Signed   By: May 12, 2020 M.D.   On: 05/10/2020 22:19   CT Angio Chest PE W/Cm &/Or Wo Cm  Result Date: 05/10/2020 CLINICAL DATA:  Shortness of breath with elevated D-dimer and hypoxia. Evaluate for pulmonary embolism. EXAM: CT ANGIOGRAPHY CHEST WITH CONTRAST TECHNIQUE: Multidetector CT imaging of the chest was performed using the standard protocol during bolus administration of intravenous contrast. Multiplanar CT image reconstructions and MIPs were obtained to evaluate the vascular anatomy. CONTRAST:  OMNIPAQUE IOHEXOL 350 MG/ML SOLN COMPARISON:  CT chest 09/08/2005 and CT abdomen/pelvis 05/09/2019 FINDINGS: Patient unable to communicate, move or speak or reply to questions as patient was restrained for the exam. Cardiovascular: Mild-to-moderate cardiomegaly is present. Moderate calcified plaque over the 3 vessel coronary arteries. Ectasia of the ascending thoracic aorta measuring 3.9 cm in AP diameter. Calcified plaque over the thoracic aorta. Pulmonary arterial system is well opacified. There is moderate artifact over the posterior field of view of the thorax right worse than left. There is mild low-attenuation over proximal right lower lobar artery which is likely due to this artifact although embolus is possible. Also mild low-attenuation over a posterior right upper lobe segmental pulmonary artery likely artifactual. No central or left-sided pulmonary emboli visualized. Mediastinum/Nodes: No evidence of hilar adenopathy. Single 1.3 cm right subcarinal lymph node likely  reactive. Remaining mediastinal structures are unremarkable. Lungs/Pleura: Mild pleural calcification over the right apex. Lungs are adequately inflated and demonstrate bibasilar posterior consolidation likely atelectasis without significant change from the prior exam from 2020. No effusion. Severe AP narrowing of the trachea at the level of the carina with severe narrowing of the proximal mainstem bronchi bilaterally. Upper Abdomen: Calcified plaque over the abdominal aorta. Prominent ovoid air collection over the anterior aspect of the mid abdomen only partially visualized and unchanged from the previous CT abdomen representing a prominent segment of sigmoid colon. Musculoskeletal: Bilateral gynecomastia. Moderate degenerative changes of the spine. Review of the MIP images confirms the above findings. IMPRESSION: 1. No definite evidence of acute pulmonary embolism. Low-attenuation over the proximal right lower lobar artery as well as a posterior right upper lobe segmental pulmonary artery likely artifactual. Concern still remains for pulmonary embolism, follow-up CT a in 24-48 hours may be helpful. 2. Stable bibasilar consolidation likely atelectasis and not significant changed from the prior exam. Severe AP narrowing of the trachea at the level of the carina with severe narrowing of the proximal mainstem bronchi bilaterally. 1.3 cm right subcarinal lymph node likely reactive. 3. Cardiomegaly and atherosclerotic coronary artery disease. 4. Aortic atherosclerosis. Ectasia of the ascending thoracic aorta measuring 3.9 cm in AP diameter. Recommend annual imaging followup by CTA or MRA. This recommendation follows 2010 ACCF/AHA/AATS/ACR/ASA/SCA/SCAI/SIR/STS/SVM Guidelines for the Diagnosis and Management of Patients with Thoracic Aortic Disease. Circulation.2010; 121: R604-V409. Aortic aneurysm NOS (ICD10-I71.9) Aortic Atherosclerosis (ICD10-I70.0). Electronically Signed   By: Elberta Fortis M.D.   On: 05/10/2020 08:24    CT Abdomen Pelvis W Contrast  Result Date: 05/10/2020 CLINICAL DATA:  Abdominal distension and positive blood cultures. EXAM: CT ABDOMEN AND PELVIS WITH CONTRAST TECHNIQUE: Multidetector CT imaging of the abdomen and pelvis was performed using the standard protocol following bolus administration of intravenous contrast. CONTRAST:  75mL OMNIPAQUE IOHEXOL 300 MG/ML  SOLN COMPARISON:  May 09, 2019 FINDINGS: Lower chest: Stable moderate severity areas of consolidation are seen along the posterior aspect of the bilateral lung bases. Mild surrounding atelectatic changes are noted. Hepatobiliary: No focal liver abnormality is seen. Ill-defined subcentimeter gallstones are seen within the lumen of a contracted gallbladder. There is no evidence of biliary dilatation. Pancreas: Unremarkable. No pancreatic ductal dilatation or surrounding inflammatory changes.  Spleen: Normal in size without focal abnormality. Adrenals/Urinary Tract: Adrenal glands are unremarkable. Kidneys are normal in size, without renal calculi or hydronephrosis. Multiple ill-defined subcentimeter bilateral renal cysts are seen. Contrast is seen throughout the urinary bladder lumen. Stomach/Bowel: Stomach is within normal limits. Appendix appears normal. A large amount of stool is seen within the distal aspect of a redundant sigmoid colon. Dilatation of the distal sigmoid colon is seen proximal to this region. The numerous noninflamed diverticula are seen within the sigmoid colon. Vascular/Lymphatic: There is marked severity calcification of the abdominal aorta and bilateral common iliac arteries, without evidence of aneurysmal dilatation. No enlarged abdominal or pelvic lymph nodes. Reproductive: Prostate is unremarkable. Other: No abdominal wall hernia or abnormality. No abdominopelvic ascites. Musculoskeletal: Multilevel degenerative changes seen throughout the lumbar spine. IMPRESSION: 1. Findings consistent with fecal impaction within the  distal sigmoid colon with subsequent distal large bowel obstruction. 2. Stable moderate severity areas of bibasilar consolidation. 3. Cholelithiasis. 4. Sigmoid diverticulosis. Aortic Atherosclerosis (ICD10-I70.0). Electronically Signed   By: Aram Candela M.D.   On: 05/10/2020 23:20   DG Chest Portable 1 View  Result Date: 05/10/2020 CLINICAL DATA:  Shortness of breath. EXAM: PORTABLE CHEST 1 VIEW COMPARISON:  May 19, 2019 FINDINGS: Mild patchy opacities are seen within the bilateral lung bases and lateral aspect of the mid right lung. These areas are mildly increased in severity when compared to the prior study. Small bilateral pleural effusions are seen. No pneumothorax is identified. The cardiac silhouette is mildly enlarged. The visualized skeletal structures are unremarkable. IMPRESSION: 1. Mild patchy bilateral lung opacities which are, in part, chronic in nature. A superimposed component of atelectasis and/or infiltrate cannot be excluded. Correlation with chest CT is recommended. 2. Small bilateral pleural effusions. Electronically Signed   By: Aram Candela M.D.   On: 05/10/2020 00:48    EKG: I independently viewed the EKG done and my findings are as followed: Sinus tachycardia at rate of 111 bpm  Assessment/Plan Present on Admission: . Severe sepsis (HCC) . Constipation . DVT (deep venous thrombosis) (HCC) . Chronic pulmonary embolism (HCC) . 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 Hyperlipidemia associated with type 2 diabetes mellitus (HCC) . Hyperthyroidism  Principal Problem:   Severe sepsis (HCC) Active Problems:   DVT (deep venous thrombosis) (HCC)   Chronic pulmonary embolism (HCC)   Constipation   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   Hyperthyroidism   Hyperlipidemia associated with type 2 diabetes mellitus (HCC)   Aspiration pneumonia (HCC)   Hypernatremia   AKI (acute kidney  injury) (HCC)   Essential hypertension   Fecal impaction of colon (HCC)   Leukocytosis   Hyperglycemia due to diabetes mellitus (HCC)   Lactic acidosis  Severe sepsis possibly secondary to aspiration pneumonia Lactic acidosis in the setting of above Patient was febrile, tachycardic and have leukocytosis (WBC > 12)-meet SIRS criteria Patient has increased requirement of oxygen and was presumed to have aspiration pneumonia (source of infection)-meet sepsis criteria Patient also presents with lactic acidosis meet severe sepsis criteria IV hydration was provided with LR He was started on IV vancomycin and cefepime, we shall continue with same and add metronidazole Continue Tylenol 650 mg every 6 hours as needed for fever Continue IV hydration Lactic acid 2.6 > 2.2, continue to trend lactic acid Patient is currently n.p.o. Consult speech therapy for swallow eval and treat prior to diet Blood culture pending  Acute on chronic respiratory failure in the setting of  above Continue treatment as per above Continue supplemental oxygen at 3 LPM with plan to wean patient back to baseline oxygen requirement (2 LPM) Continue incentive telemetry (if patient would be able to cooperate)  Abdominal distention possibly secondary to fecal impaction causing large bowel obstruction History of constipation CT abdomen and pelvis with contrast showed findings consistent with fecal impaction within the distal sigmoid colon with subsequent distal large bowel obstruction Abdomen was distended at baseline and there was no change in abdominal distention since he was last seen in the ED (almost 24 hours ago) per ED physician. Last bowel movement unknown Manual disimpaction by RN in the ED was unsuccessful Lactulose rectally will be attempted when patient is able to cooperate  Hypernatremia Na 147; continue IV hydration   Acute kidney injury BUN to creatinine 41/1.65 (baseline creatinine at 1.0-1.2) Renally  adjust medications, avoid nephrotoxic agents/dehydration/hypotension  Hyperglycemia secondary to poorly controlled type 2 diabetes mellitus Continue insulin sliding scale and hypoglycemic protocol  History of DVT/PE Eliquis temporarily held at this time due to risk of aspiration. Therapeutic Lovenox will be continued at this time pending recommendation from speech therapy regarding oral intake.  Hyperthyroidism Continue methimazole when patient resumes oral intake  Hyperlipidemia Continue Lipitor when patient resumes oral intake  Essential hypertension (controlled) BP meds will be held at this time due to soft BP  Stroke/CVA with residual right hemiparesis and dysphagia Continue home meds when patient resumes oral intake  DVT prophylaxis: Lovenox  Code Status: DNR  Family Communication: None at bedside  Disposition Plan:  Patient is from:                      [facility] Anticipated DC to:                 [facility] Anticipated DC date:               2-3 days Anticipated DC barriers:          Patient is unstable to be discharged at this time due to severe sepsis secondary to presumed aspiration pneumonia POA and LBO secondary to fecal impaction requiring inpatient management.  Consults called: None  Admission status: Inpatient    Frankey Shown MD [facility]  05/11/2020, 2:01 AM

## 2020-05-11 NOTE — [place] Notes (Signed)
Pharmacy Antibiotic Note  [name] [name] is a 74 y.o. male admitted on 05/10/2020 with sepsis.  Pharmacy has been consulted for Vancomycin/Cefepime dosing. WBC is elevated. Noted renal dysfunction. Noted PCN allergy, has tolerated cefepime before.   Plan: Vancomycin 2000 mg IV x 1, then 1250 mg IV q24h Cefepime 2g IV q12h Trend WBC, temp, renal function  F/U infectious work-up Drug levels as indicated   Height: 5\' 8"  (172.7 cm) Weight: 111.6 kg (246 lb 0.5 oz) IBW/kg (Calculated) : 68.4  Temp (24hrs), Avg:99.3 F (37.4 C), Min:98 F (36.7 C), Max:101.7 F (38.7 C)  Recent Labs  Lab 05/10/20 0121 05/10/20 2141 05/10/20 2320  WBC 9.6 18.8*  --   CREATININE 1.45* 1.65*  --   LATICACIDVEN  --  2.6* 2.2*    Estimated Creatinine Clearance: 47.6 mL/min (A) (by C-G formula based on SCr of 1.65 mg/dL (H)).    Allergies  Allergen Reactions  . Penicillin G   . Penicillins     Has patient had a PCN reaction causing immediate rash, facial/tongue/throat swelling, SOB or lightheadedness with hypotension: unknown Has patient had a PCN reaction causing severe rash involving mucus membranes or skin necrosis: unknown Has patient had a PCN reaction that required hospitalization: unknown Has patient had a PCN reaction occurring within the last 10 years: unknown If all of the above answers are "NO", then may proceed with Cephalosporin use. 5/3 Tolerated Cefepime x 1 dose in ED.    05/12/20, PharmD, BCPS Clinical Pharmacist Phone: [phone]

## 2020-05-12 ENCOUNTER — Inpatient Hospital Stay (HOSPITAL_COMMUNITY): Payer: Medicare Other

## 2020-05-12 ENCOUNTER — Encounter (HOSPITAL_COMMUNITY): Payer: Self-pay | Admitting: Internal Medicine

## 2020-05-12 DIAGNOSIS — Z7189 Other specified counseling: Secondary | ICD-10-CM

## 2020-05-12 DIAGNOSIS — J690 Pneumonitis due to inhalation of food and vomit: Secondary | ICD-10-CM

## 2020-05-12 DIAGNOSIS — K5641 Fecal impaction: Secondary | ICD-10-CM | POA: Diagnosis not present

## 2020-05-12 DIAGNOSIS — Z515 Encounter for palliative care: Secondary | ICD-10-CM

## 2020-05-12 LAB — GLUCOSE, CAPILLARY
Glucose-Capillary: 116 mg/dL — ABNORMAL HIGH (ref 70–99)
Glucose-Capillary: 113 mg/dL — ABNORMAL HIGH (ref 70–99)
Glucose-Capillary: 152 mg/dL — ABNORMAL HIGH (ref 70–99)
Glucose-Capillary: 136 mg/dL — ABNORMAL HIGH (ref 70–99)
Glucose-Capillary: 138 mg/dL — ABNORMAL HIGH (ref 70–99)

## 2020-05-12 LAB — BLOOD CULTURE ID PANEL (REFLEXED) - BCID2

## 2020-05-12 LAB — URINE CULTURE
Culture: >=100000 — AB
Culture: NO GROWTH

## 2020-05-12 LAB — COMPREHENSIVE METABOLIC PANEL
ALT: 37 U/L (ref 0–44)
AST: 30 U/L (ref 15–41)
Albumin: 2.9 g/dL — ABNORMAL LOW (ref 3.5–5.0)
Alkaline Phosphatase: 48 U/L (ref 38–126)
Anion gap: 8 (ref 5–15)
BUN: 36 mg/dL — ABNORMAL HIGH (ref 8–23)
CO2: 34 mmol/L — ABNORMAL HIGH (ref 22–32)
Calcium: 8.3 mg/dL — ABNORMAL LOW (ref 8.9–10.3)
Chloride: 109 mmol/L (ref 98–111)
Creatinine, Ser: 1.37 mg/dL — ABNORMAL HIGH (ref 0.61–1.24)
GFR, Estimated: 54 mL/min — ABNORMAL LOW (ref >60)
Glucose, Bld: 114 mg/dL — ABNORMAL HIGH (ref 70–99)
Potassium: 3.9 mmol/L (ref 3.5–5.1)
Sodium: 151 mmol/L — ABNORMAL HIGH (ref 135–145)
Total Bilirubin: 0.3 mg/dL (ref 0.3–1.2)
Total Protein: 7.2 g/dL (ref 6.5–8.1)

## 2020-05-12 LAB — CBC
HCT: 41.2 % (ref 39.0–52.0)
Hemoglobin: 11.5 g/dL — ABNORMAL LOW (ref 13.0–17.0)
MCH: 30.1 pg (ref 26.0–34.0)
MCHC: 27.9 g/dL — ABNORMAL LOW (ref 30.0–36.0)
MCV: 107.9 fL — ABNORMAL HIGH (ref 80.0–100.0)
Platelets: 197 10*3/uL (ref 150–400)
RBC: 3.82 MIL/uL — ABNORMAL LOW (ref 4.22–5.81)
RDW: 13.8 % (ref 11.5–15.5)
WBC: 15.7 10*3/uL — ABNORMAL HIGH (ref 4.0–10.5)
nRBC: 0 % (ref 0.0–0.2)

## 2020-05-12 MED ORDER — DEXTROSE 5 % IV SOLN: INTRAVENOUS | Status: DC

## 2020-05-12 MED ORDER — LACTULOSE ENEMA: 300.0000 mL | Freq: Every day | ORAL | Status: DC | PRN

## 2020-05-12 MED ORDER — IPRATROPIUM-ALBUTEROL 0.5-2.5 (3) MG/3ML IN SOLN
3.0000 mL | Freq: Two times a day (BID) | RESPIRATORY_TRACT | Status: DC
  Administered 2020-05-12 – 2020-05-22 (×21): 3 mL via RESPIRATORY_TRACT
  Filled 2020-05-12 (×20): qty 3

## 2020-05-12 NOTE — Plan of Care (Signed)

## 2020-05-12 NOTE — TOC Initial Note (Addendum)
Transition of Care [facility]) - Initial/Assessment Note   Patient Details  Name: [name] [name] MRN: [id number] Date of Birth: 09-30-1945  Transition of Care [facility]) CM/SW Contact:    Ewing Schlein, LCSW Phone Number: 05/12/2020, 10:55 AM  Clinical Narrative: Patient is a 74 year old male who was admitted for severe sepsis. 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 Readmission prevention checklist completed due to high readmission score. CSW made two attempts to reach patient's brother, Cyprian Gongaware, to complete assessment, but CSW was unable to reach him or leave voicemail.  CSW completed assessment with Lynnea Ferrier from [facility]. Per Lynnea Ferrier, patient has been a LTC resident of [facility] for 15+ years. Patient does have cognitive impairment at baseline as a result of his stroke. Patient is not on O2 at baseline. Patient is no longer independent will any of his ADLs. Patient can return to [facility] when medically stable and Kerri requested new FL2. FL2 started. TOC to follow.  Addendum: CSW received call from [place] to update [facility] that patient is active with [facility]. CSW confirmed with Cassandra with [facility] the patient is active with their OP palliative program.  Expected Discharge Plan: Skilled Nursing Facility (Patient is long-term resident of [facility]) Barriers to Discharge: Continued Medical Work up  Patient Goals and CMS Choice Patient states their goals for this hospitalization and ongoing recovery are:: Return to [facility] CMS Medicare.gov Compare Post Acute Care list provided to:: Patient Represenative (must comment) Choice offered to / list presented to : NA  Expected Discharge Plan and Services Expected Discharge Plan: Skilled Nursing Facility (Patient is long-term resident of [facility]) In-house  Referral: Clinical Social Work Discharge Planning Services: NA Post Acute Care Choice: Skilled Nursing Facility (Patient is LTC resident of [facility]) Living arrangements for the past 2 months: Skilled Nursing Facility            DME Arranged: N/A DME Agency: NA HH Arranged: NA HH Agency: NA  Prior Living Arrangements/Services Living arrangements for the past 2 months: Skilled Nursing Facility Lives with:: Facility Resident Patient language and need for interpreter reviewed:: Yes Do you feel safe going back to the place where you live?: Yes      Need for Family Participation in Patient Care: Yes (Comment) (Patient has cognitive impairment) Care giver support system in place?: Yes (comment) Criminal Activity/Legal Involvement Pertinent to Current Situation/Hospitalization: No - Comment as needed  Activities of Daily Living Home Assistive Devices/Equipment: Wheelchair ADL Screening (condition at time of admission) Patient's cognitive ability adequate to safely complete daily activities?: No Is the patient deaf or have difficulty hearing?: No Does the patient have difficulty seeing, even when wearing glasses/contacts?: No Does the patient have difficulty concentrating, remembering, or making decisions?: Yes Patient able to express need for assistance with ADLs?: No Does the patient have difficulty dressing or bathing?: Yes Independently performs ADLs?: No Communication: Needs assistance Is this a change from baseline?: Pre-admission baseline Dressing (OT): Needs assistance Is this a change from baseline?: Pre-admission baseline Grooming: Needs assistance Is this a change from baseline?: Pre-admission baseline Feeding: Needs assistance Is this a change from baseline?: Pre-admission baseline Bathing: Needs assistance Is this a change from baseline?: Pre-admission baseline Toileting: Needs assistance Is this a change from baseline?: Pre-admission baseline In/Out Bed: Needs assistance Is  this a change from baseline?: Pre-admission baseline Walks in Home:  Dependent Is this a change from baseline?: Pre-admission baseline Does the patient have difficulty walking or climbing stairs?: Yes Weakness of Legs: Both Weakness of Arms/Hands: Both  Permission Sought/Granted Permission sought to share information with : Facility Medical sales representative, Family Supports Permission granted to share info w AGENCY: [facility]  Emotional Assessment Attitude/Demeanor/Rapport: Unable to Assess Affect (typically observed): Unable to Assess Orientation: : Oriented to Self Alcohol / Substance Use: Not Applicable Psych Involvement: No (comment)  Admission diagnosis:  Fecal impaction (HCC) [K56.41] Sepsis (HCC) [A41.9] Severe sepsis (HCC) [A41.9, R65.20] Sepsis, due to unspecified organism, unspecified whether acute organ dysfunction present [facility]) [A41.9] Patient Active Problem List   Diagnosis Date Noted  . Severe sepsis (HCC) 05/11/2020  . Fecal impaction (HCC) 05/11/2020  . Leukocytosis 05/11/2020  . Hyperglycemia due to diabetes mellitus (HCC) 05/11/2020  . Lactic acidosis 05/11/2020  . Acute on chronic respiratory failure with hypercapnia (HCC) 05/11/2020  . Aortic atherosclerosis (HCC) 05/06/2020  . Essential hypertension 06/26/2019  . GERD (gastroesophageal reflux disease) 06/06/2019  . Increased oropharyngeal secretions   . Hypernatremia 05/09/2019  . 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 AKI (acute kidney injury) (HCC) 05/09/2019  . Elevated PSA, between 10 and less than 20 ng/ml 02/08/2019  . Aspiration pneumonia (HCC) 01/30/2019  . 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 Dysphagia due to old cerebrovascular accident-on honey thickened liquids 08/25/2018  . Hyperlipidemia associated with type 2 diabetes mellitus (HCC) 08/25/2018  . Hyperthyroidism 04/05/2017  .  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 COPD (chronic obstructive pulmonary disease) (HCC) 10/19/2016  . 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 Long term current use of anticoagulant therapy-on Eliquis due to history of DVT/PE and strokes 10/21/2012  . DVT (deep venous thrombosis) (HCC) 09/26/2012  . Chronic pulmonary embolism (HCC) 09/26/2012  . Constipation 09/26/2012   PCP:  Sharee Holster, NP Pharmacy:   [facility] [place], [place] - [phone] [place] [address] Phone: [phone] Fax: [phone]  Readmission Risk Interventions Readmission Risk Prevention Plan 05/12/2020 05/23/2019 05/10/2019  Transportation Screening Complete Complete Complete  PCP or Specialist Appt within 3-5 Days - Complete -  HRI or Home Care Consult - Not Complete Not Complete  HRI or Home Care Consult comments - Pt. L[facility] Pt resides in [facility] at [facility]  Social Work Consult for Recovery Care Planning/Counseling - Complete -  Palliative Care Screening - Complete -  Medication Review Oceanographer) Complete Complete Complete  PCP or Specialist appointment within 3-5 days of discharge Not Complete - -  PCP/Specialist Appt Not Complete comments PCP appointment will be set up by [facility] - -  HRI or Home Care Consult Complete - -  SW Recovery Care/Counseling Consult Complete - -  Palliative Care Screening Complete - -  Skilled Nursing Facility Complete - -  Some recent data might be hidden

## 2020-05-12 NOTE — Consult Note (Signed)
Referring Provider: Dr. Franky MachoMark Jenkins  Primary Care Physician:  Sharee HolsterGreen, Deborah S, NP Primary Gastroenterologist:  Dr. Karilyn Cotaehman   Date of Admission: 05/10/20 Date of Consultation: 05/12/20  Reason for Consultation:  Colonic dilatation   HPI:  [name] [name] is a 74 y.o. year old male resident of the [facility] with history of sigmoid volvulus requiring flex sigmoidoscopy for decompression in May 2018 X 2. Rectal tube for decompression was successful on subsequent occassion in May 2018 with recurrent volvulus during hospitalization. 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 Found to have significant abdominal distension with CT May 10, 2020 with fecal impaction within the distal sigmoid colon with subsequent distal large bowel obstruction. High risk surgical candidate. General Surgery has evaluated patient. Abdominal xray today with mildly increased gaseous dilatation of redundant sigmoid colon, moderate colonic stool burden.   On 2 liters nasal cannula. No distress noted. Laying in bed. Attempts to speak but with dysarthria, difficult to understand. Left hand in mitt restraint. Per nursing staff, he has had moderate amount of soft, mushy BMs this morning. At some point, rectal tube had been removed and not in place. NPO. No family at bedside.   Past Medical History:  Diagnosis Date  . DVT (deep venous thrombosis) (HCC) 09/26/2012  . Dysphasia   . Fatty liver   . Hemiplegia (HCC)   . Hypertension   . Pneumonia   . Pulmonary embolism (HCC)   . 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(HCC)   . Stroke [facility](HCC)     Past Surgical History:  Procedure Laterality Date  . ESOPHAGOGASTRODUODENOSCOPY (EGD) WITH PROPOFOL N/A 05/15/2019   Procedure: ESOPHAGOGASTRODUODENOSCOPY (EGD) WITH PROPOFOL;  Surgeon: Lucretia RoersBridges, Lindsay C, MD;  Location: [facility];  Service:  General;  Laterality: N/A;  . FLEXIBLE SIGMOIDOSCOPY N/A 10/21/2016   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with successful decompression, few divertiula at sigmoid  . FLEXIBLE SIGMOIDOSCOPY N/A 10/25/2016   sigmoid volvulus with successful decompression, dilated sigmoid colon.   Marland Kitchen. PEG PLACEMENT N/A 05/15/2019   Procedure: PERCUTANEOUS ENDOSCOPIC GASTROSTOMY (PEG) PLACEMENT;  Surgeon: Lucretia RoersBridges, Lindsay C, MD;  Location: [facility];  Service: General;  Laterality: N/A;  . unable      Prior to Admission medications   Medication Sig Start Date End Date Taking? Authorizing Provider  acetaminophen (TYLENOL) 325 MG tablet Take 650 mg by mouth every 6 (six) hours as needed for moderate pain.    Yes [provider]  apixaban (ELIQUIS) 5 MG TABS tablet Place 5 mg into feeding tube 2 (two) times daily. For dvt/pe/cva po   Yes [provider]  atorvastatin (LIPITOR) 10 MG tablet Take 10 mg by mouth daily.   Yes [provider]  atropine 1 % ophthalmic solution Place 1 drop under the tongue every 6 (six) hours as needed (excessive oral secretions).    Yes [provider]  bisacodyl (DULCOLAX) 10 MG suppository Place 1 suppository (10 mg total) rectally at bedtime. 05/23/19  Yes Sherryll BurgerShah, Pratik D, DO  carboxymethylcellulose (REFRESH PLUS) 0.5 % SOLN Place 1 drop into the right eye in the morning, at noon, in the evening, and at bedtime.    Yes [provider]  clindamycin (CLEOCIN) 300 MG capsule Take 300 mg by mouth every 8 (eight) hours. 12am, 8am, 4pm 05/10/20 05/16/20 Yes [provider]  erythromycin ophthalmic ointment Place 1 application into the right eye 2 (two) times daily. Also apply  to right eye - dx corneal scar - continue until next ophthalmology visit At Bedtime 04/26/20  Yes [provider]  insulin aspart (NOVOLOG FLEXPEN) 100 UNIT/ML FlexPen Inject 15 Units into the skin 2 (two) times daily with a meal. With Lunch and Supper 04/07/20  Yes [provider]   Insulin Glargine (BASAGLAR KWIKPEN) 100 UNIT/ML SOPN Inject 31 Units into the skin at bedtime.  04/07/20  Yes [provider]  ipratropium-albuterol (DUONEB) 0.5-2.5 (3) MG/3ML SOLN Take 3 mLs by nebulization every 6 (six) hours.  10/02/19  Yes [provider]  labetalol (NORMODYNE) 200 MG tablet Take 200 mg by mouth daily. For HTN   Yes [provider]  lactulose (CHRONULAC) 10 GM/15ML solution Give 30 cc by mouth twice a day 11/13/19  Yes [provider]  lisinopril (ZESTRIL) 2.5 MG tablet Take 2.5 mg by mouth daily. hold for systolic < 110   Yes [provider]  loratadine (CLARITIN) 10 MG tablet Take 10 mg by mouth daily.   Yes [provider]  methimazole (TAPAZOLE) 5 MG tablet Take 2.5 mg by mouth daily. 11/28/18  Yes [provider]  NON FORMULARY Diet- Liquids: Nectar Thick Liquids  Diet:Dysphagia 1 Puree   Yes [provider]  OXYGEN Inhale 3 L into the lungs continuous. Every shift to maintain satuation >90%.   Yes [provider]  sennosides (SENOKOT) 8.8 MG/5ML syrup Take 5 mLs by mouth 2 (two) times daily as needed for mild constipation. 06/11/19  Yes [provider]  clindamycin 600 mg in dextrose 5 % 100 mL Inject 600 mg into the vein every 8 (eight) hours for 7 days. Patient not taking: Reported on 05/11/2020 05/10/20 05/17/20  Eber Hong, MD  Vitamins A & D (VITAMIN A & D) ointment Apply 1 application topically as needed for dry skin. To left heel qshift for prevention. 11/08/19   [provider]    Current Facility-Administered Medications  Medication Dose Route Frequency Provider Last Rate Last Admin  . acetaminophen (TYLENOL) suppository 650 mg  650 mg Rectal Q6H PRN Adefeso, Oladapo, DO      . ceFEPIme (MAXIPIME) 2 g in sodium chloride 0.9 % 100 mL IVPB  2 g Intravenous Q12H Stevphen Rochester, RPH 200 mL/hr at 05/11/20 2148 2 g at 05/11/20 2148  . Chlorhexidine Gluconate Cloth 2  % PADS 6 each  6 each Topical Q0600 Lanae Boast, MD   6 each at 05/12/20 0546  . dextrose 5 % solution   Intravenous Continuous Lanae Boast, MD 75 mL/hr at 05/12/20 0811 New Bag at 05/12/20 0811  . enoxaparin (LOVENOX) injection 110 mg  110 mg Subcutaneous Q12H Stevphen Rochester, RPH   110 mg at 05/11/20 2147  . insulin aspart (novoLOG) injection 0-9 Units  0-9 Units Subcutaneous Q4H Adefeso, Oladapo, DO   2 Units at 05/11/20 0804  . ipratropium-albuterol (DUONEB) 0.5-2.5 (3) MG/3ML nebulizer solution 3 mL  3 mL Nebulization Q6H PRN Lanae Boast, MD   3 mL at 05/11/20 1954  . ipratropium-albuterol (DUONEB) 0.5-2.5 (3) MG/3ML nebulizer solution 3 mL  3 mL Nebulization BID Kc, Ramesh, MD   3 mL at 05/12/20 0717  . lactulose (CHRONULAC) enema 200 gm  300 mL Rectal Daily PRN Kc, Ramesh, MD      . metroNIDAZOLE (FLAGYL) IVPB 500 mg  500 mg Intravenous Q8H Adefeso, Oladapo, DO 100 mL/hr at 05/12/20 0221 500 mg at 05/12/20 0221  . mupirocin ointment (BACTROBAN) 2 % 1 application  1 application Nasal BID Lanae Boast, MD   1 application at 05/11/20 2149  . vancomycin (VANCOREADY) IVPB 1250 mg/250 mL  1,250 mg Intravenous Q24H Stevphen Rochester, RPH 166.7 mL/hr at 05/11/20 2222 1,250 mg at 05/11/20 2222    Allergies as of 05/10/2020 - Review Complete 05/10/2020  Allergen Reaction Noted  . Penicillin g  08/29/2019  . Penicillins      Family History: Unable to obtain  Social History   Socioeconomic History  . Marital status: Single    Spouse name: Not on file  . Number of children: Not on file  . Years of education: Not on file  . Highest education level: Not on file  Occupational History  . Not on file  Tobacco Use  . Smoking status: Never Smoker  . Smokeless tobacco: Never Used  Vaping Use  . Vaping Use: Never used  Substance and Sexual Activity  . Alcohol use: No    Alcohol/week: 0.0 standard drinks  . Drug use: No  . Sexual activity: Not on file  Other Topics Concern  . Not on file  Social  History Narrative  . Not on file   Social Determinants of Health   Financial Resource Strain:   . Difficulty of Paying Living Expenses: Not on file  Food Insecurity:   . Worried About Programme researcher, broadcasting/film/video in the Last Year: Not on file  . Ran Out of Food in the Last Year: Not on file  Transportation Needs:   . Lack of Transportation (Medical): Not on file  . Lack of Transportation (Non-Medical): Not on file  Physical Activity:   . Days of Exercise per Week: Not on file  . Minutes of Exercise per Session: Not on file  Stress:   . Feeling of Stress : Not on file  Social Connections:   . Frequency of Communication with Friends and Family: Not on file  . Frequency of Social Gatherings with Friends and Family: Not on file  . Attends Religious Services: Not on file  . Active Member of Clubs or Organizations: Not on file  . Attends Banker Meetings: Not on file  . Marital Status: Not on file  Intimate Partner Violence:   . Fear of Current or Ex-Partner: Not on file  . Emotionally Abused: Not on file  . Physically Abused: Not on file  . Sexually Abused: Not on file    Review of Systems: Unable to obtain due to cognitive status   Physical Exam: Vital signs in last 24 hours: Temp:  [98.2 F (36.8 C)-98.4 F (36.9 C)] 98.2 F (36.8 C) (11/22 0406) Pulse Rate:  [94-106] 106 (11/22 0406) Resp:  [19-20] 19 (11/22 0406) BP: (118-164)/(77-83) 164/83 (11/22 0406) SpO2:  [88 %-100 %] 96 % (11/22 0718)   General:   Alert, chronically ill-appearing, no distress, dysarthria  Head:  Normocephalic and atraumatic. Eyes:  Sclera clear, no icterus.   Ears:  Normal auditory acuity. Nose:  No deformity, discharge,  or lesions. Mouth:  Poor dentition  Lungs: scattered rhonchi, no distress, unlabored, 2 liters nasal cannula Heart:  S1 S2 present without obvious murmur Abdomen:  Distended, no rigidity, no tenderness to palpation, protruding small umbilical hernia, scant tympanitic  sounds    Rectal:  Deferred  Msk:  Right-sided hemiparesis  Extremities:  Lower extremities in bilateral boots, left hand in restraint mitt Neurologic:  Alert, unable to assess orientation Psych:  Alert and cooperative. Normal mood and affect.  Intake/Output from previous day:  11/21 0701 - 11/22 0700 In: 3153.2 [I.V.:2203.2; IV Piggyback:950] Out: 1550 [Urine:1550] Intake/Output this shift: Total I/O In: -  Out: 350 [Urine:350]  Lab Results: Recent Labs    05/10/20 2141 05/11/20 0544 05/12/20 0456  WBC 18.8* 27.2* 15.7*  HGB 12.4* 11.8* 11.5*  HCT 44.8 42.8 41.2  PLT 229 211 197   BMET Recent Labs    05/11/20 0544 05/11/20 1340 05/12/20 0456  NA 148* 148* 151*  K 5.3* 4.9 3.9  CL 103 105 109  CO2 36* 38* 34*  GLUCOSE 173* 130* 114*  BUN 42* 40* 36*  CREATININE 1.78* 1.60* 1.37*  CALCIUM 8.3* 8.1* 8.3*   LFT Recent Labs    05/10/20 0121 05/11/20 0544 05/12/20 0456  PROT 7.7 7.3 7.2  ALBUMIN 3.2* 3.0* 2.9*  AST 19 68* 30  ALT 18 55* 37  ALKPHOS 51 52 48  BILITOT 0.3 0.6 0.3   PT/INR Recent Labs    05/10/20 2141 05/11/20 0544  LABPROT 15.9* 16.0*  INR 1.3* 1.3*    Studies/Results: DG Chest 1 View  Result Date: 05/10/2020 CLINICAL DATA:  Abdominal distension and positive blood cultures. EXAM: CHEST  1 VIEW COMPARISON:  May 10, 2020 (12:04 a.m.) FINDINGS: Decreased lung volumes are noted which is likely secondary to the degree of patient inspiration. Mild patchy opacities are again seen within the bilateral lung bases and along the lateral aspect of the mid right lung. This is unchanged in appearance when compared to the prior exam. The small pleural effusions seen on the prior study are not clearly identified on the current exam. The cardiac silhouette is unchanged in appearance. The visualized skeletal structures are unremarkable. IMPRESSION: Persistent mild patchy bilateral opacities. These are nonspecific and may be, in part, chronic in nature,  however, superimposed acute infiltrate cannot be excluded. Electronically Signed   By: Aram Candela M.D.   On: 05/10/2020 22:19   CT Abdomen Pelvis W Contrast  Result Date: 05/10/2020 CLINICAL DATA:  Abdominal distension and positive blood cultures. EXAM: CT ABDOMEN AND PELVIS WITH CONTRAST TECHNIQUE: Multidetector CT imaging of the abdomen and pelvis was performed using the standard protocol following bolus administration of intravenous contrast. CONTRAST:  39mL OMNIPAQUE IOHEXOL 300 MG/ML  SOLN COMPARISON:  May 09, 2019 FINDINGS: Lower chest: Stable moderate severity areas of consolidation are seen along the posterior aspect of the bilateral lung bases. Mild surrounding atelectatic changes are noted. Hepatobiliary: No focal liver abnormality is seen. Ill-defined subcentimeter gallstones are seen within the lumen of a contracted gallbladder. There is no evidence of biliary dilatation. Pancreas: Unremarkable. No pancreatic ductal dilatation or surrounding inflammatory changes. Spleen: Normal in size without focal abnormality. Adrenals/Urinary Tract: Adrenal glands are unremarkable. Kidneys are normal in size, without renal calculi or hydronephrosis. Multiple ill-defined subcentimeter bilateral renal cysts are seen. Contrast is seen throughout the urinary bladder lumen. Stomach/Bowel: Stomach is within normal limits. Appendix appears normal. A large amount of stool is seen within the distal aspect of a redundant sigmoid colon. Dilatation of the distal sigmoid colon is seen proximal to this region. The numerous noninflamed diverticula are seen within the sigmoid colon. Vascular/Lymphatic: There is marked severity calcification of the abdominal aorta and bilateral common iliac arteries, without evidence of aneurysmal dilatation. No enlarged abdominal or pelvic lymph nodes. Reproductive: Prostate is unremarkable. Other: No abdominal wall hernia or abnormality. No abdominopelvic ascites. Musculoskeletal:  Multilevel degenerative changes seen throughout the lumbar spine. IMPRESSION: 1. Findings consistent with fecal impaction within the distal sigmoid colon with  subsequent distal large bowel obstruction. 2. Stable moderate severity areas of bibasilar consolidation. 3. Cholelithiasis. 4. Sigmoid diverticulosis. Aortic Atherosclerosis (ICD10-I70.0). Electronically Signed   By: Aram Candela M.D.   On: 05/10/2020 23:20   DG Abd Portable 1V  Result Date: 05/12/2020 CLINICAL DATA:  Bowel obstruction. EXAM: PORTABLE ABDOMEN - 1 VIEW COMPARISON:  CT abdomen and pelvis 05/10/2020 FINDINGS: There is a moderate amount of stool in the colon. There is persistent gaseous distension of redundant sigmoid colon which has mildly increased from the recent CT. No dilated small bowel loops are seen. There is mild lumbar levoscoliosis. IMPRESSION: Mildly increased gaseous dilatation of redundant sigmoid colon. Moderate colonic stool burden. Electronically Signed   By: Sebastian Ache M.D.   On: 05/12/2020 08:40    Impression: 74 year old male, resident of the [facility] with numerous comorbidities including but not limited to history of remote CVA, right-sided hemiparesis, recurrent DVT/PE on Eliquis, history of sigmoid volvulus seen by GI here at [facility] in 2018 with decompression X 2 via flex sig and subsequent successful decompression with rectal tube thereafter. Now presenting with severe sepsis, acute on chronic respiratory failure, blood culture + but felt to be contaminant with repeat blood cultures no growth thus far, and abdominal distension with CT findings of fecal impaction within distal sigmoid and subsequent distal large bowel obstruction. Abdominal xray this morning with mildly increased gaseous dilatation of redundant sigmoid colon and moderate colonic burden.  Discussed with nursing staff: multiple soft stools this morning. I am uncertain of patient'[name] baseline, but he does not appear to be in distress from  a GI standpoint although abdomen is distended but without tenderness or rigidity. As he has had success at times with rectal tube for decompression, will try this initially. Poor surgical candidate. At risk for recurrent volvulus in setting of redundant sigmoid colon, reduced motility, etc. Recommend serial xrays and pursue CT if acute abdominal pain.   I notice he is on lactulose as an outpatient. Would avoid this going forward to avoid increased gas and side effects.   Plan: Flexi-seal for decompression  Frequent turning Serial abdominal xrays Follow electrolytes and correct as appropriate Avoid lactulose in outpatient setting Repeat CT if acute pain or worsening symptoms Will continue to follow with you   Gelene Mink, PhD, ANP-BC [facility]      LOS: 1 day    05/12/2020, 9:46 AM

## 2020-05-12 NOTE — Consult Note (Signed)
Consultation Note Date: 05/12/2020   Patient Name: [name] [name]  DOB: 07/02/1945  MRN: [id number]  Age / Sex: 74 y.o., male  PCP: Sharee Holster, NP Referring Physician: Lanae Boast, MD  Reason for Consultation: Establishing goals of care and Psychosocial/spiritual support  HPI/Patient Profile: 74 y.o. male  with past medical history of prior stroke-residual R hemiparesis/dysphagia-honey thick, reurrent DVT/PE-anticoag/Eliquis, hyperthyroidism, HTN, recurrent sigmoid volvulus admitted on 05/10/2020 with Severe sepsis? 2/2 aspiration pne, resp fail, large bowel obs.   Clinical Assessment and Goals of Care: [name] [name] is lying quietly in bed.  He appears morbidly obese, chronically ill and frail.  He has right hemiplegia, dysarthria.  I do not believe he is able to make his basic needs known unless asked direct yes or no questions.  There is no family at bedside at this time.  Reassurance given to [name] [name], I share that I will reach out to his family.  Call to brother/HC POA, [name] [name].  [name] [name] states that [name] [name] has 2 adult sons, but they do not do much for [name] [name].  He shares that [name] [name] first became disabled from a major stroke next month will be 18 years ago.  He was unable to care for himself and has been institutionalized since.  [name] [name] and I talked about aspiration pneumonia, speech therapy consult, dietary recommendations.  [name] [name] states that his brother has issues with aspiration, but he works with a nursing home to make sure he has the correct diet.  He mentions that they have regular meetings about goals of care.  We also talked about bowel obstruction, bowel regimen.  During our conversation [name] [name] states that every thing is "in his record".  I reassure [name] [name] that we have read [name] [name] chart, that our discussions are more directed toward making sure that [name] [name] understands what is going on,  and that his questions are answered.  He states understanding.  He agreed's that his brother is active with palliative care outpatient with hospice of [facility].  He also agrees to return to [facility] under long-term care.  Conference with attending, bedside nursing staff, transition of care team related to patient condition, needs, goals of care.  HCPOA    NEXT OF KIN -brother, [name] [name].    SUMMARY OF RECOMMENDATIONS   At this point continue to treat the treatable but no CPR or intubation. Return to [facility] long-term care facility where he has been for approximately 15 years. Active with outpatient palliative services through hospice of [facility] At this point [employer] as needed  Code Status/Advance Care Planning:  DNR -verified with brother, [name] [name]  Symptom Management:   Per hospitalist, no additional needs at this time.  Palliative Prophylaxis:   Frequent Pain Assessment, Oral Care and Turn Reposition  Additional Recommendations (Limitations, Scope, Preferences):  Treat the treatable but no CPR or intubation  Psycho-social/Spiritual:   Desire for further Chaplaincy support:no  Additional Recommendations: Caregiving  Support/Resources and Education on Hospice  Prognosis:   Unable  to determine, based on outcomes.  6 months or less would not be surprising based on functional status, frailty, resident of long-term care.  Discharge Planning: To be determined, based on outcomes.  Anticipate return to [facility] long-term care facility.      Primary Diagnoses: Present on Admission: . Severe sepsis (HCC) . Constipation . DVT (deep venous thrombosis) (HCC) . Chronic pulmonary embolism (HCC) . 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 Hyperlipidemia associated with type 2 diabetes mellitus (HCC) . Hyperthyroidism   I have reviewed the medical record, interviewed the patient and family, and examined  the patient. The following aspects are pertinent.  Past Medical History:  Diagnosis Date  . DVT (deep venous thrombosis) (HCC) 09/26/2012  . Dysphasia   . Fatty liver   . Hemiplegia (HCC)   . Hypertension   . Pneumonia   . Pulmonary embolism (HCC)   . 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(HCC)   . Stroke [facility](HCC)    Social History   Socioeconomic History  . Marital status: Single    Spouse name: Not on file  . Number of children: Not on file  . Years of education: Not on file  . Highest education level: Not on file  Occupational History  . Not on file  Tobacco Use  . Smoking status: Never Smoker  . Smokeless tobacco: Never Used  Vaping Use  . Vaping Use: Never used  Substance and Sexual Activity  . Alcohol use: No    Alcohol/week: 0.0 standard drinks  . Drug use: No  . Sexual activity: Not on file  Other Topics Concern  . Not on file  Social History Narrative  . Not on file   Social Determinants of Health   Financial Resource Strain:   . Difficulty of Paying Living Expenses: Not on file  Food Insecurity:   . Worried About Programme researcher, broadcasting/film/videounning Out of Food in the Last Year: Not on file  . Ran Out of Food in the Last Year: Not on file  Transportation Needs:   . Lack of Transportation (Medical): Not on file  . Lack of Transportation (Non-Medical): Not on file  Physical Activity:   . Days of Exercise per Week: Not on file  . Minutes of Exercise per Session: Not on file  Stress:   . Feeling of Stress : Not on file  Social Connections:   . Frequency of Communication with Friends and Family: Not on file  . Frequency of Social Gatherings with Friends and Family: Not on file  . Attends Religious Services: Not on file  . Active Member of Clubs or Organizations: Not on file  . Attends BankerClub or Organization Meetings: Not on file  . Marital Status: Not on file   History reviewed. No pertinent family history. Scheduled Meds: . Chlorhexidine Gluconate Cloth  6 each Topical Q0600  . enoxaparin (LOVENOX)  injection  110 mg Subcutaneous Q12H  . insulin aspart  0-9 Units Subcutaneous Q4H  . ipratropium-albuterol  3 mL Nebulization BID  . mupirocin ointment  1 application Nasal BID   Continuous Infusions: . ceFEPime (MAXIPIME) IV 2 g (05/12/20 1021)  . dextrose 75 mL/hr at 05/12/20 0811  . metronidazole 500 mg (05/12/20 1130)  . vancomycin 1,250 mg (05/11/20 2222)   PRN Meds:.acetaminophen, ipratropium-albuterol, lactulose Medications Prior to Admission:  Prior to Admission medications   Medication Sig Start Date End Date Taking? Authorizing Provider  acetaminophen (TYLENOL) 325 MG tablet Take 650 mg by mouth every 6 (six) hours as needed for moderate  pain.    Yes [provider]  apixaban (ELIQUIS) 5 MG TABS tablet Place 5 mg into feeding tube 2 (two) times daily. For dvt/pe/cva po   Yes [provider]  atorvastatin (LIPITOR) 10 MG tablet Take 10 mg by mouth daily.   Yes [provider]  atropine 1 % ophthalmic solution Place 1 drop under the tongue every 6 (six) hours as needed (excessive oral secretions).    Yes [provider]  bisacodyl (DULCOLAX) 10 MG suppository Place 1 suppository (10 mg total) rectally at bedtime. 05/23/19  Yes Sherryll Burger, Pratik D, DO  carboxymethylcellulose (REFRESH PLUS) 0.5 % SOLN Place 1 drop into the right eye in the morning, at noon, in the evening, and at bedtime.    Yes [provider]  clindamycin (CLEOCIN) 300 MG capsule Take 300 mg by mouth every 8 (eight) hours. 12am, 8am, 4pm 05/10/20 05/16/20 Yes [provider]  erythromycin ophthalmic ointment Place 1 application into the right eye 2 (two) times daily. Also apply to right eye - dx corneal scar - continue until next ophthalmology visit At Bedtime 04/26/20  Yes [provider]  insulin aspart (NOVOLOG FLEXPEN) 100 UNIT/ML FlexPen Inject 15 Units into the skin 2 (two) times daily with a meal. With Lunch and Supper 04/07/20  Yes [provider]  Insulin Glargine (BASAGLAR KWIKPEN) 100 UNIT/ML SOPN Inject 31 Units into the skin at bedtime.  04/07/20  Yes [provider]  ipratropium-albuterol (DUONEB) 0.5-2.5 (3) MG/3ML SOLN Take 3 mLs by nebulization every 6 (six) hours.  10/02/19  Yes [provider]  labetalol (NORMODYNE) 200 MG tablet Take 200 mg by mouth daily. For HTN   Yes [provider]  lactulose (CHRONULAC) 10 GM/15ML solution Give 30 cc by mouth twice a day 11/13/19  Yes [provider]  lisinopril (ZESTRIL) 2.5 MG tablet Take 2.5 mg by mouth daily. hold for systolic < 110   Yes [provider]  loratadine (CLARITIN) 10 MG tablet Take 10 mg by mouth daily.   Yes [provider]  methimazole (TAPAZOLE) 5 MG tablet Take 2.5 mg by mouth daily. 11/28/18  Yes [provider]  NON FORMULARY Diet- Liquids: Nectar Thick Liquids  Diet:Dysphagia 1 Puree   Yes [provider]  OXYGEN Inhale 3 L into the lungs continuous. Every shift to maintain satuation >90%.   Yes [provider]  sennosides (SENOKOT) 8.8 MG/5ML syrup Take 5 mLs by mouth 2 (two) times daily as needed for mild constipation. 06/11/19  Yes [provider]  clindamycin 600 mg in dextrose 5 % 100 mL Inject 600 mg into the vein every 8 (eight) hours for 7 days. Patient not taking: Reported on 05/11/2020 05/10/20 05/17/20  Eber Hong, MD  Vitamins A & D (VITAMIN A & D) ointment Apply 1 application topically as needed for dry skin. To left heel qshift for prevention. 11/08/19   [provider]   Allergies  Allergen Reactions  . Penicillin G   . Penicillins     Has patient had a PCN reaction causing immediate rash, facial/tongue/throat swelling, SOB or lightheadedness with hypotension: unknown Has patient had a PCN reaction causing severe rash involving mucus membranes or skin necrosis: unknown Has patient had a PCN reaction that required hospitalization: unknown Has patient  had a PCN reaction occurring within the last 10 years: unknown If all of the above answers are "NO", then may proceed with Cephalosporin use. 5/3 Tolerated Cefepime x 1 dose in  ED.    Review of Systems  Unable to perform ROS: Patient nonverbal    Physical Exam Vitals and nursing note reviewed.  Constitutional:      General: He is not in acute distress.    Appearance: He is ill-appearing.  HENT:     Head: Normocephalic and atraumatic.     Mouth/Throat:     Mouth: Mucous membranes are moist.  Cardiovascular:     Rate and Rhythm: Normal rate.  Pulmonary:     Effort: Pulmonary effort is normal. No respiratory distress.  Abdominal:     General: There is distension.     Palpations: Abdomen is soft.     Tenderness: There is guarding.  Skin:    General: Skin is warm and dry.  Neurological:     Mental Status: He is alert. Mental status is at baseline.     Vital Signs: BP (!) 164/83 (BP Location: Left Arm)   Pulse (!) 106   Temp 98.2 F (36.8 C)   Resp 19   Ht 5\' 8"  (1.727 m)   Wt 111.6 kg   SpO2 96%   BMI 37.41 kg/m  Pain Scale: 0-10   Pain Score: 0-No pain   SpO2: SpO2: 96 % O2 Device:SpO2: 96 % O2 Flow Rate: .O2 Flow Rate (L/min): 2 L/min  IO: Intake/output summary:   Intake/Output Summary (Last 24 hours) at 05/12/2020 1234 Last data filed at 05/12/2020 05/14/2020 Gross per 24 hour  Intake 3153.15 ml  Output 1600 ml  Net 1553.15 ml    LBM:   Baseline Weight: Weight: 111.6 kg Most recent weight: Weight: 111.6 kg     Palliative Assessment/Data:   Flowsheet Rows     Most Recent Value  Intake Tab  Referral Department Hospitalist  Unit at Time of Referral Cardiac/Telemetry Unit  Palliative Care Primary Diagnosis Sepsis/Infectious Disease  Date Notified 05/11/20  Palliative Care Type Return patient Palliative Care  Reason for referral Clarify Goals of Care  Date of Admission 05/10/20  Date first seen by Palliative Care 05/12/20  # of days Palliative  referral response time 1 Day(s)  # of days IP prior to Palliative referral 1  Clinical Assessment  Palliative Performance Scale Score 30%  Pain Max last 24 hours Not able to report  Pain Min Last 24 hours Not able to report  Dyspnea Max Last 24 Hours Not able to report  Dyspnea Min Last 24 hours Not able to report  Psychosocial & Spiritual Assessment  Palliative Care Outcomes      Time In: 1010 Time Out: 1100 Time Total: 50 minutes   Greater than 50%  of this time was spent counseling and coordinating care related to the above assessment and plan.  Signed by: 05/14/20, NP   Please contact Palliative Medicine Team phone at [phone] for questions and concerns.  For individual provider: See [phone]

## 2020-05-12 NOTE — Evaluation (Signed)
Clinical/Bedside Swallow Evaluation Patient Details  Name: [name] [name] MRN: [id number] Date of Birth: 1946/01/20  Today's Date: 05/12/2020 Time: SLP Start Time (ACUTE ONLY): 0900 SLP Stop Time (ACUTE ONLY): 0928 SLP Time Calculation (min) (ACUTE ONLY): 28 min  Past Medical History:  Past Medical History:  Diagnosis Date  . DVT (deep venous thrombosis) (HCC) 09/26/2012  . Dysphasia   . Fatty liver   . Hemiplegia (HCC)   . Hypertension   . Pneumonia   . Pulmonary embolism (HCC)   . 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(HCC)   . Stroke [facility])    Past Surgical History:  Past Surgical History:  Procedure Laterality Date  . ESOPHAGOGASTRODUODENOSCOPY (EGD) WITH PROPOFOL N/A 05/15/2019   Procedure: ESOPHAGOGASTRODUODENOSCOPY (EGD) WITH PROPOFOL;  Surgeon: Lucretia Roers, MD;  Location: [facility];  Service: General;  Laterality: N/A;  . FLEXIBLE SIGMOIDOSCOPY N/A 10/21/2016   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with successful decompression, few divertiula at sigmoid  . FLEXIBLE SIGMOIDOSCOPY N/A 10/25/2016   sigmoid volvulus with successful decompression, dilated sigmoid colon.   Marland Kitchen PEG PLACEMENT N/A 05/15/2019   Procedure: PERCUTANEOUS ENDOSCOPIC GASTROSTOMY (PEG) PLACEMENT;  Surgeon: Lucretia Roers, MD;  Location: [facility];  Service: General;  Laterality: N/A;  . unable     HPI:  [name] [name] is an 74 y.o. 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  He was noted to have a positive blood culture.  He was admitted to the hospital for treatment of his aspiration pneumonia, but he was also noted to have significant abdominal distention and a CT scan of the abdomen revealed an enlarged sigmoid: With possible fecal impaction.  There does not appear to be  evidence of pneumatosis.  No volvulus was seen.  A Flexi-Seal rectal tube has been placed with some output.  He does have abnormalities including leukocytosis and elevated lactic acid level. Pt is known to SLP service with last visit for MBSS 06/20/2019 with recommendation for D2 and NTL (see previous report). BSE requested.   Assessment / Plan / Recommendation Clinical Impression  Clinical swallow evaluation completed at bedside. Pt has a history of dysphagia from previous strokes. He was last seen for MBSS last December and D2 and NTL was recommended. Pt has been consuming D1/puree and NTL at [facility]. Pt with left facial weakness, impaired lingual movement and mastication resulting in labial spillage and poor oral bolus cohesiveness with min oral residuals. Pt noted to be impulsive with intake previously and is reportedly no longer feeding himself at SNF due to impulsivity and reduced safety awareness with self feeding. During MBSS last year, he did masticated the graham cracker and swallowed whole. Pt is at risk for aspiration due to long standing dysphagia, however risks can be minimized with diet modification. Can initiate D1/puree and honey thick liquids (HTL) and SLP to follow during acute stay with hopeful advancement back to NTL. Will defer diet order to MD due to GI issues currently.    SLP Visit Diagnosis: Dysphagia, unspecified (R13.10)    Aspiration Risk  Moderate aspiration risk    Diet Recommendation Dysphagia 1 (Puree);Honey-thick liquid   Liquid Administration via: Cup Medication Administration: Whole meds with puree Supervision: Full supervision/cueing for compensatory strategies;Staff to assist with self feeding Compensations: Slow rate;Small sips/bites;Multiple dry swallows after each bite/sip Postural Changes: Seated  upright at 90 degrees;Remain upright for at least 30 minutes after po intake    Other  Recommendations Oral Care Recommendations: Oral care BID;Staff/trained  caregiver to provide oral care Other Recommendations: Order thickener from pharmacy;Clarify dietary restrictions   Follow up Recommendations Skilled Nursing facility      Frequency and Duration min 2x/week  1 week       Prognosis Prognosis for Safe Diet Advancement: Fair Barriers to Reach Goals: Severity of deficits      Swallow Study   General Date of Onset: 05/10/20 HPI: [name] [name] is an 74 y.o. 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  He was noted to have a positive blood culture.  He was admitted to the hospital for treatment of his aspiration pneumonia, but he was also noted to have significant abdominal distention and a CT scan of the abdomen revealed an enlarged sigmoid: With possible fecal impaction.  There does not appear to be evidence of pneumatosis.  No volvulus was seen.  A Flexi-Seal rectal tube has been placed with some output.  He does have abnormalities including leukocytosis and elevated lactic acid level. Pt is known to SLP service with last visit for MBSS 06/20/2019 with recommendation for D2 and NTL (see previous report). BSE requested. Type of Study: Bedside Swallow Evaluation Previous Swallow Assessment: 06/20/2019 outpt MBS D2/NTL Diet Prior to this Study: NPO Temperature Spikes Noted: No Respiratory Status: Nasal cannula History of Recent Intubation: No Behavior/Cognition: Alert;Cooperative;Pleasant mood;Requires cueing (perseverative) Oral Cavity Assessment: Dried secretions Oral Care Completed by SLP: Yes Oral Cavity - Dentition: Adequate natural dentition;Missing dentition Vision: Impaired for self-feeding Self-Feeding Abilities:  (po assist due to impulsvity) Patient Positioning:  Upright in bed Baseline Vocal Quality:  (harsh) Volitional Cough: Strong;Congested Volitional Swallow: Able to elicit    Oral/Motor/Sensory Function Overall Oral Motor/Sensory Function: Moderate impairment Facial ROM: Reduced left;Suspected CN VII (facial) dysfunction Facial Symmetry: Abnormal symmetry left;Suspected CN VII (facial) dysfunction Facial Strength: Reduced left;Suspected CN VII (facial) dysfunction Lingual ROM: Reduced right;Reduced left Lingual Symmetry: Within Functional Limits Lingual Strength: Reduced Velum:  (could not visualize) Mandible: Impaired   Ice Chips Ice chips: Within functional limits Presentation: Spoon   Thin Liquid Thin Liquid: Not tested    Nectar Thick Nectar Thick Liquid: Impaired Presentation: Cup Oral Phase Impairments: Reduced labial seal Oral phase functional implications: Right anterior spillage;Prolonged oral transit Pharyngeal Phase Impairments: Cough - Immediate   Honey Thick Honey Thick Liquid: Impaired Presentation: Cup Oral Phase Impairments: Reduced labial seal   Puree Puree: Impaired Presentation: Spoon Oral Phase Impairments: Reduced labial seal;Reduced lingual movement/coordination Oral Phase Functional Implications: Oral residue   Solid     Solid: Not tested     Thank you,  Havery Moros, CCC-SLP [phone]  Amanda Steuart 05/12/2020,11:33 AM

## 2020-05-12 NOTE — Progress Notes (Signed)
PROGRESS NOTE    Reeder DesanctisScoville T Desena  ZOX:[id number] DOB: [birth year] DOA: 05/10/2020 PCP: Sharee HolsterGreen, Deborah S, NP   Chief Complaint  Patient presents with  . 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 As per report: he was seen in the ED on late evening of 11/19 due to shortness of breath whereby he was requiring more supplemental oxygen and his oxygen was increased from 2 L/min to 3 L/min at the nursing facility, breathing treatment was also provided, but his O2 sats continued to be in the 80s, so he was taken to the ED for further evaluation.   In the ED, patient was suspected to have aspiration pneumonia due to his history of dysphagia and prior aspiration pneumonia, breathing treatment (albuterol and Atrovent) and small amount of white and occasionally yellow mucus were suctioned from the nares and throat, He was started on IV clindamycin.  CT scan done at that time does not reveal any specific abnormalities and patient had no leukocytosis, BNP was normal, respiratory panel was negative for influenza A, B and SARS coronavirus 2, so patient was discharged back to [facility] for a 7-day course of IV clindamycin. Blood culture drawn when patient came to the ED was positive for gram-positive cocci in one of the 2 cultures (possibly contaminated), but based on patient's clinical presentation, he was brought back to the emergency department. In the ED, he was febrile and tachycardic, LABS- leukocytosis, hypernatremia,, hyperglycemia, acute renal failure with BUN to  creatinine 41/1.65 (baseline creatinine at 1.0-1.2).  Lactic acidosis  2.6> 2.2.  UA unimpressive for UTI. CT abdomen and pelvis with contrast showed findings consistent with fecal impaction within the distal sigmoid colon with subsequent distal large bowel obstruction.  CT angiography of chest with contrast showed no definite evidence of acute PE, though still shows concern for PE.  CXR-persistent mild patchy bilateral opacities with possible superimposed acute infiltrate.  He was just tired and IV fluids, antibiotics and admitted for further management   Subjective: Patient is alert awake able to tell me his name , saying he was at nursing home. He was denying any abdominal pain nausea vomiting. He was just having semisolid bowel movement. On 2 L nasal cannula overnight, afebrile.  Blood pressure is stable labs with improving renal function.  Urine output 155 0 mL, unmeasured history 2 times.   Assessment & Plan:  Severe sepsis POA likely multifactorial with severe aspiration pneumonia also with large bowel obstruction: Leukocytosis improving, vital signs Stable lactic acidosis resolved.Will continue on empiric antibiotic with vancomycin/cefepime/Flagyl.  MRSA screen is positive, repeat blood culture pending  Dilated colon possible obstruction versus ileus with constipation/history of chronic constipation ?OGILVIE SYNDROME: appreciate surgery input, continue lactulose enemap rn, Ivf, npo, f/u Xray abdomen.  Labs improving interms of renal failure and leukocytosis.  Continue IV fluids keep n.p.o.  Appreciate GI consultation from surgery regarding endoscopic decompression and replacing Flexi-Seal.  Recent blood culture + in aerobic bottle only - staph in BCID 05/10/20 am- f/u culture,?contamination.  Again blood culture from 05/10/20, 2141-gram-positive cocci in clusters from anaerobic bottle.  We will keep on IV vancomycin and antibiotics as above.  Follow-up the final culture  Acute on chronic  hypoxic respiratory failure from combination of aspiration pneumonia/abdominal distention: Down to 2 L nasal cannula home setting. Continue supplemental oxygen aspiration precaution and pulmonary support.    Acute renal failure,baseline creatinine at 1.0-1.2: Likely from sepsis/bowel obstruction.  Renal failure is improving on IV fluids, change fluids to hypo-tonic fluid  due to hypernatremia.  Recent Labs  Lab 05/10/20 0121 05/10/20 2141 05/11/20 0544 05/11/20 1340 05/12/20 0456  BUN 36* 41* 42* 40* 36*  CREATININE 1.45* 1.65* 1.78* 1.60* 1.37*   Hyperkalemia at 5.3, resolved.    History of recurrent DVT/PE on Eliquis at baseline.  Currently n.p.o. and so cont therapeutic Lovenox.  Hyperthyroidism on methimazole, resume once able to take p.o.  Hyperlipidemia/hypertension: PO meds remains on hold, blood pressure soft.  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: Keep n.p.o., holding p.o. meds given his abdominal distention. speech is following.  Hypernatremia: Likely from free water deficit from his bowel obstruction and decreased oral intake.  Switched IV fluids to hypotonic D5-monitor blood sugar.    Hyperglycemia due to diabetes mellitus: HbA1c is still 7.9.  Continue blood sugar monitoring and sliding scale insulin since switched to D5.  Recent Labs  Lab 05/11/20 2035 05/11/20 2348 05/12/20 0406 05/12/20 0735 05/12/20 1132  GLUCAP 83 105* 116* 113* 152*   Morbid obesity with Body mass index is 37.41 kg/m.  Not sure if it is feasible for him to lose any more weight.   SNK:NLZJQ w/ his brother 11/21-who states he is a Product manager.Prognosis remains guarded at this time patient is DNR.  Palliative consultation today. He is at risk of decompensation in the setting of severe sepsis, abdominal distention bowel obstruction along with hypoxic respiratory failure and aspiration.Spoke to his brother,he had PEG tube and got better w/ eating and was removed past  year or so.  Nutrition: Diet Order            Diet NPO time specified  Diet effective now                 DVT prophylaxis: SCDs Start: 05/11/20 0142 Code Status:   Code Status: DNR  Family Communication: plan of care discussed with patient's brother   And bedside RN.  Status is: Inpatient. Remains inpatient appropriate because:IV treatments appropriate due to intensity of illness or inability to take PO and Inpatient level of care appropriate due to severity of illness  Dispo: The patient is from: SNF              Anticipated d/c is to: SNF              Anticipated d/c date is: > 3 days              Patient currently is not medically stable to d/c. Consultants:see note  Procedures:see note  Culture/Microbiology    Component Value Date/Time   SDES LEFT ANTECUBITAL 05/10/2020 2230   SPECREQUEST  05/10/2020 2230    BOTTLES DRAWN AEROBIC AND ANAEROBIC Blood Culture results may not be optimal due to an inadequate volume of blood received in culture bottles   CULT  05/10/2020 2230    NO GROWTH < 12 HOURS Performed at [facility], [address] [place], [place] [address]    REPTSTATUS PENDING 05/10/2020 2230    Other culture-see note  Medications: Scheduled Meds: . Chlorhexidine Gluconate Cloth  6 each Topical Q0600  . enoxaparin (LOVENOX) injection  110 mg Subcutaneous Q12H  .  insulin aspart  0-9 Units Subcutaneous Q4H  . ipratropium-albuterol  3 mL Nebulization BID  . mupirocin ointment  1 application Nasal BID   Continuous Infusions: . ceFEPime (MAXIPIME) IV 2 g (05/12/20 1021)  . dextrose 75 mL/hr at 05/12/20 0811  . metronidazole 500 mg (05/12/20 1130)  . vancomycin 1,250 mg (05/11/20 2222)    Antimicrobials: Anti-infectives (From admission, onward)   Start     Dose/Rate Route Frequency Ordered Stop   05/11/20 2200  vancomycin (VANCOREADY) IVPB 1250 mg/250 mL        1,250 mg 166.7 mL/hr over 90 Minutes Intravenous Every 24 hours 05/11/20 0316     05/11/20  1000  ceFEPIme (MAXIPIME) 2 g in sodium chloride 0.9 % 100 mL IVPB        2 g 200 mL/hr over 30 Minutes Intravenous Every 12 hours 05/11/20 0316     05/11/20 0300  metroNIDAZOLE (FLAGYL) IVPB 500 mg        500 mg 100 mL/hr over 60 Minutes Intravenous Every 8 hours 05/11/20 0214 05/18/20 0259   05/10/20 2215  aztreonam (AZACTAM) 2 g in sodium chloride 0.9 % 100 mL IVPB  Status:  Discontinued        2 g 200 mL/hr over 30 Minutes Intravenous  Once 05/10/20 2205 05/10/20 2211   05/10/20 2215  vancomycin (VANCOCIN) IVPB 1000 mg/200 mL premix  Status:  Discontinued        1,000 mg 200 mL/hr over 60 Minutes Intravenous  Once 05/10/20 2205 05/10/20 2207   05/10/20 2215  vancomycin (VANCOREADY) IVPB 2000 mg/400 mL        2,000 mg 200 mL/hr over 120 Minutes Intravenous  Once 05/10/20 2207 05/11/20 0050   05/10/20 2215  ceFEPIme (MAXIPIME) 2 g in sodium chloride 0.9 % 100 mL IVPB        2 g 200 mL/hr over 30 Minutes Intravenous  Once 05/10/20 2211 05/10/20 2328     Objective: Vitals: Today's Vitals   05/11/20 2124 05/12/20 0406 05/12/20 0718 05/12/20 0800  BP: 125/80 (!) 164/83    Pulse: 94 (!) 106    Resp: 20 19    Temp: 98.4 F (36.9 C) 98.2 F (36.8 C)    TempSrc:      SpO2: 94% 96% 96%   Weight:      Height:      PainSc:    0-No pain    Intake/Output Summary (Last 24 hours) at 05/12/2020 1421 Last data filed at 05/12/2020 0910 Gross per 24 hour  Intake 3153.15 ml  Output 1600 ml  Net 1553.15 ml   Filed Weights   05/10/20 2122  Weight: 111.6 kg   Weight change:   Intake/Output from previous day: 11/21 0701 - 11/22 0700 In: 3153.2 [I.V.:2203.2; IV Piggyback:950] Out: 1550 [Urine:1550] Intake/Output this shift: Total I/O In: -  Out: 350 [Urine:350]  Examination: General exam: AAOx2, old for age,NAD, weak appearing. HEENT:Oral mucosa moist, Ear/Nose WNL grossly, dentition normal. Respiratory system: bilaterally clear, no wheezing or crackles,no use of accessory  muscle Cardiovascular system: S1 & S2 +, No JVD,. Gastrointestinal system: Abdomen soft, minimal bowel sounds, significant distention present like before. Nervous System:Alert, awake, able to interact Extremities: No edema, distal peripheral pulses palpable.  Skin: No rashes,no icterus. MSK: Normal muscle bulk,tone, power  Data Reviewed: I have personally reviewed following labs and imaging studies CBC: Recent Labs  Lab 05/10/20 0121 05/10/20 2141 05/11/20 0544 05/12/20 0456  WBC 9.6 18.8* 27.2* 15.7*  NEUTROABS 6.3  17.5*  --   --   HGB 12.4* 12.4* 11.8* 11.5*  HCT 43.6 44.8 42.8 41.2  MCV 106.3* 110.1* 112.0* 107.9*  PLT 235 229 211 197   Basic Metabolic Panel: Recent Labs  Lab 05/10/20 0121 05/10/20 2141 05/11/20 0544 05/11/20 1340 05/12/20 0456  NA 145 147* 148* 148* 151*  K 4.8 4.4 5.3* 4.9 3.9  CL 101 102 103 105 109  CO2 38* 38* 36* 38* 34*  GLUCOSE 294* 246* 173* 130* 114*  BUN 36* 41* 42* 40* 36*  CREATININE 1.45* 1.65* 1.78* 1.60* 1.37*  CALCIUM 8.7* 8.5* 8.3* 8.1* 8.3*  MG  --   --  2.3  --   --   PHOS  --   --  4.7*  --   --    GFR: Estimated Creatinine Clearance: 57.3 mL/min (A) (by C-G formula based on SCr of 1.37 mg/dL (H)). Liver Function Tests: Recent Labs  Lab 05/10/20 0121 05/11/20 0544 05/12/20 0456  AST 19 68* 30  ALT 18 55* 37  ALKPHOS 51 52 48  BILITOT 0.3 0.6 0.3  PROT 7.7 7.3 7.2  ALBUMIN 3.2* 3.0* 2.9*   No results for input(s): LIPASE, AMYLASE in the last 168 hours. No results for input(s): AMMONIA in the last 168 hours. Coagulation Profile: Recent Labs  Lab 05/10/20 2141 05/11/20 0544  INR 1.3* 1.3*   Cardiac Enzymes: No results for input(s): CKTOTAL, CKMB, CKMBINDEX, TROPONINI in the last 168 hours. BNP (last 3 results) No results for input(s): PROBNP in the last 8760 hours. HbA1C: No results for input(s): HGBA1C in the last 72 hours. CBG: Recent Labs  Lab 05/11/20 2035 05/11/20 2348 05/12/20 0406 05/12/20 0735  05/12/20 1132  GLUCAP 83 105* 116* 113* 152*   Lipid Profile: No results for input(s): CHOL, HDL, LDLCALC, TRIG, CHOLHDL, LDLDIRECT in the last 72 hours. Thyroid Function Tests: No results for input(s): TSH, T4TOTAL, FREET4, T3FREE, THYROIDAB in the last 72 hours. Anemia Panel: No results for input(s): VITAMINB12, FOLATE, FERRITIN, TIBC, IRON, RETICCTPCT in the last 72 hours. Sepsis Labs: Recent Labs  Lab 05/10/20 2141 05/10/20 2320 05/11/20 0544 05/11/20 0916  LATICACIDVEN 2.6* 2.2* 1.6 1.3    Recent Results (from the past 240 hour(s))  Resp Panel by RT-PCR (Flu A&B, Covid) Nasopharyngeal Swab     Status: None   Collection Time: 05/09/20 11:59 PM   Specimen: Nasopharyngeal Swab; Nasopharyngeal(NP) swabs in vial transport medium  Result Value Ref Range Status   SARS Coronavirus 2 by RT PCR NEGATIVE NEGATIVE Final    Comment: (NOTE) SARS-CoV-2 target nucleic acids are NOT DETECTED.  The SARS-CoV-2 RNA is generally detectable in upper respiratory specimens during the acute phase of infection. The lowest concentration of SARS-CoV-2 viral copies this assay can detect is 138 copies/mL. A negative result does not preclude SARS-Cov-2 infection and should not be used as the sole basis for treatment or other patient management decisions. A negative result may occur with  improper specimen collection/handling, submission of specimen other than nasopharyngeal swab, presence of viral mutation(s) within the areas targeted by this assay, and inadequate number of viral copies(<138 copies/mL). A negative result must be combined with clinical observations, patient history, and epidemiological information. The expected result is Negative.  Fact Sheet for Patients:  BloggerCourse.com  Fact Sheet for Healthcare Providers:  SeriousBroker.it  This test is no t yet approved or cleared by the [place] FDA and  has been authorized for  detection and/or diagnosis of SARS-CoV-2 by FDA under  an Emergency Use Authorization (EUA). This EUA will remain  in effect (meaning this test can be used) for the duration of the COVID-19 declaration under Section 564(b)(1) of the Act, 21 U.S.C.section 360bbb-3(b)(1), unless the authorization is terminated  or revoked sooner.       Influenza A by PCR NEGATIVE NEGATIVE Final   Influenza B by PCR NEGATIVE NEGATIVE Final    Comment: (NOTE) The Xpert Xpress SARS-CoV-2/FLU/RSV plus assay is intended as an aid in the diagnosis of influenza from Nasopharyngeal swab specimens and should not be used as a sole basis for treatment. Nasal washings and aspirates are unacceptable for Xpert Xpress SARS-CoV-2/FLU/RSV testing.  Fact Sheet for Patients: BloggerCourse.com  Fact Sheet for Healthcare Providers: SeriousBroker.it  This test is not yet approved or cleared by the [place] FDA and has been authorized for detection and/or diagnosis of SARS-CoV-2 by FDA under an Emergency Use Authorization (EUA). This EUA will remain in effect (meaning this test can be used) for the duration of the COVID-19 declaration under Section 564(b)(1) of the Act, 21 U.S.C. section 360bbb-3(b)(1), unless the authorization is terminated or revoked.  Performed at [facility], [address], [place], [place] [address]   Culture, blood (routine x 2)     Status: Abnormal (Preliminary result)   Collection Time: 05/10/20  1:21 AM   Specimen: BLOOD RIGHT HAND  Result Value Ref Range Status   Specimen Description   Final    BLOOD RIGHT HAND Performed at [facility], [address] [place], [place] [address]    Special Requests   Final    BOTTLES DRAWN AEROBIC AND ANAEROBIC Blood Culture adequate volume Performed at [facility], [address], [place], [place] [address]    Culture  Setup Time   Final    GRAM POSITIVE COCCI Gram Stain Report Called to,Read  Back By and Verified With: WATLINGTON,C @ 2014 ON 05/10/20 BY JUW ANAEROBIC BOTTLE ONLY GS DONE @ [facility] CRITICAL RESULT CALLED TO, READ BACK BY AND VERIFIED WITH: C. TURNER,RN 0108 05/11/2020 T. TYSOR    Culture (A)  Final    STAPHYLOCOCCUS HOMINIS THE SIGNIFICANCE OF ISOLATING THIS ORGANISM FROM A SINGLE SET OF BLOOD CULTURES WHEN MULTIPLE SETS ARE DRAWN IS UNCERTAIN. PLEASE NOTIFY THE MICROBIOLOGY DEPARTMENT WITHIN ONE WEEK IF SPECIATION AND SENSITIVITIES ARE REQUIRED. Performed at [facility], [address], [place], [place] [address]    Report Status PENDING  Incomplete  Blood Culture ID Panel (Reflexed)     Status: Abnormal   Collection Time: 05/10/20  1:21 AM  Result Value Ref Range Status   Enterococcus faecalis NOT DETECTED NOT DETECTED Final   Enterococcus Faecium NOT DETECTED NOT DETECTED Final   Listeria monocytogenes NOT DETECTED NOT DETECTED Final   Staphylococcus species DETECTED (A) NOT DETECTED Final    Comment: CRITICAL RESULT CALLED TO, READ BACK BY AND VERIFIED WITH: CLuiz Ochoa [id number] 05/11/2020 T. TYSOR    Staphylococcus aureus (BCID) NOT DETECTED NOT DETECTED Final   Staphylococcus epidermidis NOT DETECTED NOT DETECTED Final   Staphylococcus lugdunensis NOT DETECTED NOT DETECTED Final   Streptococcus species NOT DETECTED NOT DETECTED Final   Streptococcus agalactiae NOT DETECTED NOT DETECTED Final   Streptococcus pneumoniae NOT DETECTED NOT DETECTED Final   Streptococcus pyogenes NOT DETECTED NOT DETECTED Final   A.calcoaceticus-baumannii NOT DETECTED NOT DETECTED Final   Bacteroides fragilis NOT DETECTED NOT DETECTED Final   Enterobacterales NOT DETECTED NOT DETECTED Final   Enterobacter cloacae complex NOT DETECTED NOT DETECTED  Final   Escherichia coli NOT DETECTED NOT DETECTED Final   Klebsiella aerogenes NOT DETECTED NOT DETECTED Final   Klebsiella oxytoca NOT DETECTED NOT DETECTED Final   Klebsiella pneumoniae NOT DETECTED NOT DETECTED Final    Proteus species NOT DETECTED NOT DETECTED Final   Salmonella species NOT DETECTED NOT DETECTED Final   Serratia marcescens NOT DETECTED NOT DETECTED Final   Haemophilus influenzae NOT DETECTED NOT DETECTED Final   Neisseria meningitidis NOT DETECTED NOT DETECTED Final   Pseudomonas aeruginosa NOT DETECTED NOT DETECTED Final   Stenotrophomonas maltophilia NOT DETECTED NOT DETECTED Final   Candida albicans NOT DETECTED NOT DETECTED Final   Candida auris NOT DETECTED NOT DETECTED Final   Candida glabrata NOT DETECTED NOT DETECTED Final   Candida krusei NOT DETECTED NOT DETECTED Final   Candida parapsilosis NOT DETECTED NOT DETECTED Final   Candida tropicalis NOT DETECTED NOT DETECTED Final   Cryptococcus neoformans/gattii NOT DETECTED NOT DETECTED Final    Comment: Performed at [facility], [address], [place], [place] [address]  Culture, blood (routine x 2)     Status: Abnormal (Preliminary result)   Collection Time: 05/10/20  1:29 AM   Specimen: BLOOD LEFT HAND  Result Value Ref Range Status   Specimen Description   Final    BLOOD LEFT HAND Performed at [facility], [address] [place], [place] [address]    Special Requests   Final    BOTTLES DRAWN AEROBIC AND ANAEROBIC Blood Culture adequate volume Performed at [facility], [address], [place], [place] [address]    Culture  Setup Time   Final    GRAM POSITIVE COCCI Gram Stain Report Called to,Read Back By and Verified With: TURNER,C @ 2333 ON 05/10/20 BY JUW IN BOTH AEROBIC AND ANAEROBIC BOTTLES GS DONE @ [facility] CRITICAL VALUE NOTED.  VALUE IS CONSISTENT WITH PREVIOUSLY REPORTED AND CALLED VALUE.    Culture (A)  Final    STAPHYLOCOCCUS CAPITIS THE SIGNIFICANCE OF ISOLATING THIS ORGANISM FROM A SINGLE SET OF BLOOD CULTURES WHEN MULTIPLE SETS ARE DRAWN IS UNCERTAIN. PLEASE NOTIFY THE MICROBIOLOGY DEPARTMENT WITHIN ONE WEEK IF SPECIATION AND SENSITIVITIES ARE REQUIRED. Performed at [facility], [address] [place], [place] [address]    Report Status PENDING  Incomplete  Urine Culture     Status: Abnormal   Collection Time: 05/10/20  9:37 AM   Specimen: Urine, Catheterized  Result Value Ref Range Status   Specimen Description   Final    URINE, CATHETERIZED Performed at [facility], [address], [place], [place] [address]    Special Requests   Final    NONE Performed at [facility], [address] [place], [place] [address]    Culture >=100,000 COLONIES/mL CITROBACTER KOSERI (A)  Final   Report Status 05/12/2020 FINAL  Final   Organism ID, Bacteria CITROBACTER KOSERI (A)  Final      Susceptibility   Citrobacter koseri - MIC*    CEFAZOLIN >=64 RESISTANT Resistant     CEFEPIME <=0.12 SENSITIVE Sensitive     CEFTRIAXONE >=64 RESISTANT Resistant     CIPROFLOXACIN >=4 RESISTANT Resistant     GENTAMICIN <=1 SENSITIVE Sensitive     IMIPENEM 0.5 SENSITIVE Sensitive     NITROFURANTOIN 64 INTERMEDIATE Intermediate     TRIMETH/SULFA <=20 SENSITIVE Sensitive     PIP/TAZO 16 SENSITIVE Sensitive     * >=100,000 COLONIES/mL CITROBACTER KOSERI  Culture, blood (Routine X 2) w Reflex to ID Panel  Status: None (Preliminary result)   Collection Time: 05/10/20  9:41 PM   Specimen: Right Antecubital; Blood  Result Value Ref Range Status   Specimen Description RIGHT ANTECUBITAL  Final   Special Requests   Final    BOTTLES DRAWN AEROBIC AND ANAEROBIC Blood Culture adequate volume   Culture  Setup Time   Final    GRAM POSITIVE COCCI IN CLUSTERS RECOVERED FROM THE ANAEROBIC BOTTLE Gram Stain Report Called to,Read Back By and Verified With: WRIGHT,EMILY AT 1314 ON 05/12/2020 BY BAUGHAM,M. Performed at [facility] Performed at [facility], [address], [place], [place] [address]    Culture PENDING  Incomplete   Report Status PENDING  Incomplete  Culture, blood (Routine X 2) w Reflex to ID Panel     Status: None (Preliminary result)   Collection Time: 05/10/20 10:30 PM   Specimen:  Left Antecubital; Blood  Result Value Ref Range Status   Specimen Description LEFT ANTECUBITAL  Final   Special Requests   Final    BOTTLES DRAWN AEROBIC AND ANAEROBIC Blood Culture results may not be optimal due to an inadequate volume of blood received in culture bottles   Culture   Final    NO GROWTH < 12 HOURS Performed at [facility], [address] [place], [place] [address]    Report Status PENDING  Incomplete  MRSA PCR Screening     Status: Abnormal   Collection Time: 05/11/20  2:14 AM   Specimen: Nasal Mucosa; Nasopharyngeal  Result Value Ref Range Status   MRSA by PCR POSITIVE (A) NEGATIVE Final    Comment:        The GeneXpert MRSA Assay (FDA approved for NASAL specimens only), is one component of a comprehensive MRSA colonization surveillance program. It is not intended to diagnose MRSA infection nor to guide or monitor treatment for MRSA infections. RESULT CALLED TO, READ BACK BY AND VERIFIED WITH: SINSKIE,P @ 0514 ON 05/11/20 BY JUW Performed at [facility], [address], [place], [place] [address]      Radiology Studies: DG Chest 1 View  Result Date: 05/10/2020 CLINICAL DATA:  Abdominal distension and positive blood cultures. EXAM: CHEST  1 VIEW COMPARISON:  May 10, 2020 (12:04 a.m.) FINDINGS: Decreased lung volumes are noted which is likely secondary to the degree of patient inspiration. Mild patchy opacities are again seen within the bilateral lung bases and along the lateral aspect of the mid right lung. This is unchanged in appearance when compared to the prior exam. The small pleural effusions seen on the prior study are not clearly identified on the current exam. The cardiac silhouette is unchanged in appearance. The visualized skeletal structures are unremarkable. IMPRESSION: Persistent mild patchy bilateral opacities. These are nonspecific and may be, in part, chronic in nature, however, superimposed acute infiltrate cannot be excluded. Electronically  Signed   By: Aram Candela M.D.   On: 05/10/2020 22:19   CT Abdomen Pelvis W Contrast  Result Date: 05/10/2020 CLINICAL DATA:  Abdominal distension and positive blood cultures. EXAM: CT ABDOMEN AND PELVIS WITH CONTRAST TECHNIQUE: Multidetector CT imaging of the abdomen and pelvis was performed using the standard protocol following bolus administration of intravenous contrast. CONTRAST:  75mL OMNIPAQUE IOHEXOL 300 MG/ML  SOLN COMPARISON:  May 09, 2019 FINDINGS: Lower chest: Stable moderate severity areas of consolidation are seen along the posterior aspect of the bilateral lung bases. Mild surrounding atelectatic changes are noted. Hepatobiliary: No focal liver abnormality is seen. Ill-defined subcentimeter gallstones are seen  within the lumen of a contracted gallbladder. There is no evidence of biliary dilatation. Pancreas: Unremarkable. No pancreatic ductal dilatation or surrounding inflammatory changes. Spleen: Normal in size without focal abnormality. Adrenals/Urinary Tract: Adrenal glands are unremarkable. Kidneys are normal in size, without renal calculi or hydronephrosis. Multiple ill-defined subcentimeter bilateral renal cysts are seen. Contrast is seen throughout the urinary bladder lumen. Stomach/Bowel: Stomach is within normal limits. Appendix appears normal. A large amount of stool is seen within the distal aspect of a redundant sigmoid colon. Dilatation of the distal sigmoid colon is seen proximal to this region. The numerous noninflamed diverticula are seen within the sigmoid colon. Vascular/Lymphatic: There is marked severity calcification of the abdominal aorta and bilateral common iliac arteries, without evidence of aneurysmal dilatation. No enlarged abdominal or pelvic lymph nodes. Reproductive: Prostate is unremarkable. Other: No abdominal wall hernia or abnormality. No abdominopelvic ascites. Musculoskeletal: Multilevel degenerative changes seen throughout the lumbar spine.  IMPRESSION: 1. Findings consistent with fecal impaction within the distal sigmoid colon with subsequent distal large bowel obstruction. 2. Stable moderate severity areas of bibasilar consolidation. 3. Cholelithiasis. 4. Sigmoid diverticulosis. Aortic Atherosclerosis (ICD10-I70.0). Electronically Signed   By: Aram Candela M.D.   On: 05/10/2020 23:20   DG Abd Portable 1V  Result Date: 05/12/2020 CLINICAL DATA:  Bowel obstruction. EXAM: PORTABLE ABDOMEN - 1 VIEW COMPARISON:  CT abdomen and pelvis 05/10/2020 FINDINGS: There is a moderate amount of stool in the colon. There is persistent gaseous distension of redundant sigmoid colon which has mildly increased from the recent CT. No dilated small bowel loops are seen. There is mild lumbar levoscoliosis. IMPRESSION: Mildly increased gaseous dilatation of redundant sigmoid colon. Moderate colonic stool burden. Electronically Signed   By: Sebastian Ache M.D.   On: 05/12/2020 08:40    LOS: 1 day    Time spent 42 minutes in the care of this patient  Lanae Boast, MD [facility]  05/12/2020, 2:21 PM

## 2020-05-12 NOTE — Progress Notes (Signed)
Subjective: Patient is lying in the bed in no acute distress  Objective: Vital signs in last 24 hours: Temp:  [98.2 F (36.8 C)-98.4 F (36.9 C)] 98.2 F (36.8 C) (11/22 0406) Pulse Rate:  [94-106] 106 (11/22 0406) Resp:  [19-20] 19 (11/22 0406) BP: (118-164)/(77-83) 164/83 (11/22 0406) SpO2:  [88 %-100 %] 96 % (11/22 0718)    Intake/Output from previous day: 11/21 0701 - 11/22 0700 In: 3153.2 [I.V.:2203.2; IV Piggyback:950] Out: 1550 [Urine:1550] Intake/Output this shift: No intake/output data recorded.  General appearance: alert, cooperative and no distress GI: Still distended without rigidity or tenderness.  Minimal bowel sounds appreciated.  It appears the Flexi-Seal has been removed.  Lab Results:  Recent Labs    05/11/20 0544 05/12/20 0456  WBC 27.2* 15.7*  HGB 11.8* 11.5*  HCT 42.8 41.2  PLT 211 197   BMET Recent Labs    05/11/20 1340 05/12/20 0456  NA 148* 151*  K 4.9 3.9  CL 105 109  CO2 38* 34*  GLUCOSE 130* 114*  BUN 40* 36*  CREATININE 1.60* 1.37*  CALCIUM 8.1* 8.3*   PT/INR Recent Labs    05/10/20 2141 05/11/20 0544  LABPROT 15.9* 16.0*  INR 1.3* 1.3*    Studies/Results: DG Chest 1 View  Result Date: 05/10/2020 CLINICAL DATA:  Abdominal distension and positive blood cultures. EXAM: CHEST  1 VIEW COMPARISON:  May 10, 2020 (12:04 a.m.) FINDINGS: Decreased lung volumes are noted which is likely secondary to the degree of patient inspiration. Mild patchy opacities are again seen within the bilateral lung bases and along the lateral aspect of the mid right lung. This is unchanged in appearance when compared to the prior exam. The small pleural effusions seen on the prior study are not clearly identified on the current exam. The cardiac silhouette is unchanged in appearance. The visualized skeletal structures are unremarkable. IMPRESSION: Persistent mild patchy bilateral opacities. These are nonspecific and may be, in part, chronic in  nature, however, superimposed acute infiltrate cannot be excluded. Electronically Signed   By: Aram Candela M.D.   On: 05/10/2020 22:19   CT Abdomen Pelvis W Contrast  Result Date: 05/10/2020 CLINICAL DATA:  Abdominal distension and positive blood cultures. EXAM: CT ABDOMEN AND PELVIS WITH CONTRAST TECHNIQUE: Multidetector CT imaging of the abdomen and pelvis was performed using the standard protocol following bolus administration of intravenous contrast. CONTRAST:  13mL OMNIPAQUE IOHEXOL 300 MG/ML  SOLN COMPARISON:  May 09, 2019 FINDINGS: Lower chest: Stable moderate severity areas of consolidation are seen along the posterior aspect of the bilateral lung bases. Mild surrounding atelectatic changes are noted. Hepatobiliary: No focal liver abnormality is seen. Ill-defined subcentimeter gallstones are seen within the lumen of a contracted gallbladder. There is no evidence of biliary dilatation. Pancreas: Unremarkable. No pancreatic ductal dilatation or surrounding inflammatory changes. Spleen: Normal in size without focal abnormality. Adrenals/Urinary Tract: Adrenal glands are unremarkable. Kidneys are normal in size, without renal calculi or hydronephrosis. Multiple ill-defined subcentimeter bilateral renal cysts are seen. Contrast is seen throughout the urinary bladder lumen. Stomach/Bowel: Stomach is within normal limits. Appendix appears normal. A large amount of stool is seen within the distal aspect of a redundant sigmoid colon. Dilatation of the distal sigmoid colon is seen proximal to this region. The numerous noninflamed diverticula are seen within the sigmoid colon. Vascular/Lymphatic: There is marked severity calcification of the abdominal aorta and bilateral common iliac arteries, without evidence of aneurysmal dilatation. No enlarged abdominal or pelvic lymph nodes. Reproductive: Prostate is unremarkable.  Other: No abdominal wall hernia or abnormality. No abdominopelvic ascites.  Musculoskeletal: Multilevel degenerative changes seen throughout the lumbar spine. IMPRESSION: 1. Findings consistent with fecal impaction within the distal sigmoid colon with subsequent distal large bowel obstruction. 2. Stable moderate severity areas of bibasilar consolidation. 3. Cholelithiasis. 4. Sigmoid diverticulosis. Aortic Atherosclerosis (ICD10-I70.0). Electronically Signed   By: Aram Candela M.D.   On: 05/10/2020 23:20   DG Abd Portable 1V  Result Date: 05/12/2020 CLINICAL DATA:  Bowel obstruction. EXAM: PORTABLE ABDOMEN - 1 VIEW COMPARISON:  CT abdomen and pelvis 05/10/2020 FINDINGS: There is a moderate amount of stool in the colon. There is persistent gaseous distension of redundant sigmoid colon which has mildly increased from the recent CT. No dilated small bowel loops are seen. There is mild lumbar levoscoliosis. IMPRESSION: Mildly increased gaseous dilatation of redundant sigmoid colon. Moderate colonic stool burden. Electronically Signed   By: Sebastian Ache M.D.   On: 05/12/2020 08:40    Anti-infectives: Anti-infectives (From admission, onward)   Start     Dose/Rate Route Frequency Ordered Stop   05/11/20 2200  vancomycin (VANCOREADY) IVPB 1250 mg/250 mL        1,250 mg 166.7 mL/hr over 90 Minutes Intravenous Every 24 hours 05/11/20 0316     05/11/20 1000  ceFEPIme (MAXIPIME) 2 g in sodium chloride 0.9 % 100 mL IVPB        2 g 200 mL/hr over 30 Minutes Intravenous Every 12 hours 05/11/20 0316     05/11/20 0300  metroNIDAZOLE (FLAGYL) IVPB 500 mg        500 mg 100 mL/hr over 60 Minutes Intravenous Every 8 hours 05/11/20 0214 05/18/20 0259   05/10/20 2215  aztreonam (AZACTAM) 2 g in sodium chloride 0.9 % 100 mL IVPB  Status:  Discontinued        2 g 200 mL/hr over 30 Minutes Intravenous  Once 05/10/20 2205 05/10/20 2211   05/10/20 2215  vancomycin (VANCOCIN) IVPB 1000 mg/200 mL premix  Status:  Discontinued        1,000 mg 200 mL/hr over 60 Minutes Intravenous  Once  05/10/20 2205 05/10/20 2207   05/10/20 2215  vancomycin (VANCOREADY) IVPB 2000 mg/400 mL        2,000 mg 200 mL/hr over 120 Minutes Intravenous  Once 05/10/20 2207 05/11/20 0050   05/10/20 2215  ceFEPIme (MAXIPIME) 2 g in sodium chloride 0.9 % 100 mL IVPB        2 g 200 mL/hr over 30 Minutes Intravenous  Once 05/10/20 2211 05/10/20 2328      Assessment/Plan: Impression: Dilated colon secondary to possible obstruction versus ileus.  No significant change since yesterday.  Leukocytosis resolving. Plan: Will consult GI for possible endoscopic decompression.  Would replace Flex-seal.  LOS: 1 day    Franky Macho 05/12/2020

## 2020-05-13 ENCOUNTER — Inpatient Hospital Stay (HOSPITAL_COMMUNITY): Payer: Medicare Other

## 2020-05-13 ENCOUNTER — Telehealth: Payer: Self-pay

## 2020-05-13 DIAGNOSIS — K5641 Fecal impaction: Secondary | ICD-10-CM | POA: Diagnosis not present

## 2020-05-13 DIAGNOSIS — A419 Sepsis, unspecified organism: Secondary | ICD-10-CM | POA: Diagnosis not present

## 2020-05-13 DIAGNOSIS — K5900 Constipation, unspecified: Secondary | ICD-10-CM | POA: Diagnosis not present

## 2020-05-13 DIAGNOSIS — R6520 Severe sepsis without septic shock: Secondary | ICD-10-CM | POA: Diagnosis not present

## 2020-05-13 LAB — GLUCOSE, CAPILLARY
Glucose-Capillary: 125 mg/dL — ABNORMAL HIGH (ref 70–99)
Glucose-Capillary: 133 mg/dL — ABNORMAL HIGH (ref 70–99)
Glucose-Capillary: 147 mg/dL — ABNORMAL HIGH (ref 70–99)
Glucose-Capillary: 148 mg/dL — ABNORMAL HIGH (ref 70–99)
Glucose-Capillary: 149 mg/dL — ABNORMAL HIGH (ref 70–99)
Glucose-Capillary: 152 mg/dL — ABNORMAL HIGH (ref 70–99)

## 2020-05-13 LAB — CBC
HCT: 40.2 % (ref 39.0–52.0)
Hemoglobin: 11.9 g/dL — ABNORMAL LOW (ref 13.0–17.0)
MCH: 30.7 pg (ref 26.0–34.0)
MCHC: 29.6 g/dL — ABNORMAL LOW (ref 30.0–36.0)
MCV: 103.9 fL — ABNORMAL HIGH (ref 80.0–100.0)
Platelets: 203 10*3/uL (ref 150–400)
RBC: 3.87 MIL/uL — ABNORMAL LOW (ref 4.22–5.81)
RDW: 13.8 % (ref 11.5–15.5)
WBC: 12.3 10*3/uL — ABNORMAL HIGH (ref 4.0–10.5)
nRBC: 0 % (ref 0.0–0.2)

## 2020-05-13 LAB — COMPREHENSIVE METABOLIC PANEL
ALT: 29 U/L (ref 0–44)
AST: 30 U/L (ref 15–41)
Albumin: 2.8 g/dL — ABNORMAL LOW (ref 3.5–5.0)
Alkaline Phosphatase: 47 U/L (ref 38–126)
Anion gap: 9 (ref 5–15)
BUN: 27 mg/dL — ABNORMAL HIGH (ref 8–23)
CO2: 33 mmol/L — ABNORMAL HIGH (ref 22–32)
Calcium: 8.1 mg/dL — ABNORMAL LOW (ref 8.9–10.3)
Chloride: 108 mmol/L (ref 98–111)
Creatinine, Ser: 1.29 mg/dL — ABNORMAL HIGH (ref 0.61–1.24)
GFR, Estimated: 58 mL/min — ABNORMAL LOW (ref >60)
Glucose, Bld: 137 mg/dL — ABNORMAL HIGH (ref 70–99)
Potassium: 3.4 mmol/L — ABNORMAL LOW (ref 3.5–5.1)
Sodium: 150 mmol/L — ABNORMAL HIGH (ref 135–145)
Total Bilirubin: 0.4 mg/dL (ref 0.3–1.2)
Total Protein: 7 g/dL (ref 6.5–8.1)

## 2020-05-13 LAB — VANCOMYCIN, TROUGH: Vancomycin Tr: 13 ug/mL — ABNORMAL LOW (ref 15–20)

## 2020-05-13 LAB — CULTURE, BLOOD (ROUTINE X 2): Special Requests: ADEQUATE

## 2020-05-13 MED ORDER — POTASSIUM CHLORIDE 10 MEQ/100ML IV SOLN
10.0000 meq | INTRAVENOUS | Status: AC
  Administered 2020-05-13 (×2): 10 meq via INTRAVENOUS
  Filled 2020-05-13 (×2): qty 100

## 2020-05-13 MED ORDER — SODIUM CHLORIDE 0.9 % IV SOLN
1.0000 g | Freq: Three times a day (TID) | INTRAVENOUS | Status: AC
  Administered 2020-05-13 – 2020-05-19 (×21): 1 g via INTRAVENOUS
  Filled 2020-05-13 (×22): qty 1

## 2020-05-13 NOTE — Progress Notes (Signed)
PROGRESS NOTE    [name] [name]  UXN:[id number] DOB: Aug 29, 1945 DOA: 05/10/2020 PCP: Sharee Holster, NP   Chief Complaint  Patient presents with  . 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 As per report: he was seen in the ED on late evening of 11/19 due to shortness of breath whereby he was requiring more supplemental oxygen and his oxygen was increased from 2 L/min to 3 L/min at the nursing facility, breathing treatment was also provided, but his O2 sats continued to be in the 80s, so he was taken to the ED for further evaluation.   In the ED, patient was suspected to have aspiration pneumonia due to his history of dysphagia and prior aspiration pneumonia, breathing treatment (albuterol and Atrovent) and small amount of white and occasionally yellow mucus were suctioned from the nares and throat, He was started on IV clindamycin.  CT scan done at that time does not reveal any specific abnormalities and patient had no leukocytosis, BNP was normal, respiratory panel was negative for influenza A, B and SARS coronavirus 2, so patient was discharged back to [facility] for a 7-day course of IV clindamycin. Blood culture drawn when patient came to the ED was positive for gram-positive cocci in one of the 2 cultures (possibly contaminated), but based on patient's clinical presentation, he was brought back to the emergency department. In the ED, he was febrile and tachycardic, LABS- leukocytosis, hypernatremia,, hyperglycemia, acute renal failure with BUN to  creatinine 41/1.65 (baseline creatinine at 1.0-1.2).  Lactic acidosis  2.6> 2.2.  UA unimpressive for UTI. CT abdomen and pelvis with contrast showed findings consistent with fecal impaction within the distal sigmoid colon with subsequent distal large bowel obstruction.  CT angiography of chest with contrast showed no definite evidence of acute PE, though still shows concern for PE.  CXR-persistent mild patchy bilateral opacities with possible superimposed acute infiltrate.  He was just tired and IV fluids, antibiotics and admitted for further management. Patient is followed by Dr. Lovell Sheehan from surgery. Being managed with supportive care and edema. With ongoing abdominal distention GI was consulted 11/22, recommended Flexi-Seal for decompression.  Subjective: Seen and examined, patient with gurgling sound but able to wake up and interact he denies any abdominal pain or distention. Labs with potassium 3.4 sodium stable slightly low 150, creatinine improving, leukocytosis improving, afebrile overnight, blood pressure stable.  Bowel movement x4 yesterday, Output 220 0 mL, reassuring Surgeon at bedside  Assessment & Plan:  Severe sepsis POA likely multifactorial secondary to aspiration pneumonia also with large bowel obstruction: Vital signs stable, leukocytosis improving.  We will continue on empiric vancomycin/cefepime, blood cultures growing gram-positive cocci, culture in process.  Speech following currently n.p.o. due to GI issues and once GI issues stable, per SSLP okay to start PUREED and HTL diet.  Dilated colon possible obstruction versus ileus with constipation/history of chronic constipation ?OGILVIE SYNDROME: Appreciate surgery input, status post lactulose enema.  GI consulted due to ongoing decompression, advised Flexi-Seal for decompression and holding off on flex sigmoidoscopy decompression.  Continue on IV fluid hydration.  Avoid p.o. lactulose due to gaseous distension as outpatient.  Had soft BM x4 yesterday  Recent blood culture + in aerobic bottle only - staph in BCID 05/10/20 am- f/u culture,?contamination.  Again blood culture from 05/10/20, 2141- w/ gram-positive cocci in clusters from anaerobic bottle-likely contamination, follow-up on culture report for now continue empiric antibiotics as #1 and de-escalate antibiotics.  Acute on chronic hypoxic respiratory failure from combination of aspiration pneumonia/abdominal distention: At baseline 2 L, currently needing 4 L nasal cannula.  Monitor respiratory status closely, continue pulmonary support, antibiotics.    Hypernatremia:from free water deficit from his bowel obstruction and decreased oral intake.  increase D5W to 100 ml/hr. Monitor BMP  Acute renal failure,baseline creatinine at 1.0-1.2: Multifactorial from sepsis/bowel obstruction.  Continue IV fluid hydration renal function improving.  Good urine output.   Recent Labs  Lab 05/10/20 2141 05/11/20 0544 05/11/20 1340 05/12/20 0456 05/13/20 0402  BUN 41* 42* 40* 36* 27*  CREATININE 1.65* 1.78* 1.60* 1.37* 1.29*   Hyperkalemia at 5.3, hyperkalemia resolved  Hypokalemia- will replete w. iv kcl given patient's bowel ischemia   History of recurrent DVT/PE on Eliquis at baseline.  Currently n.p.o. and so cont therapeutic Lovenox.  Hyperthyroidism on methimazole, resume once able to take p.o.  Hyperlipidemia/hypertension: Blood pressure is overall stable.  Oral meds on hold   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: Speech following, remains n.p.o. due to abdominal issues.  Baseline cognitive issues and weakness.   Hyperglycemia due to diabetes mellitus: HbA1c is still 7.9.  Blood sugar is stable despite being on D5W.  On sliding scale insulin.  Recent Labs  Lab 05/12/20 1132 05/12/20 1600 05/12/20 2027 05/13/20 0019 05/13/20 0442  GLUCAP 152* 136* 138* 148* 125*   Morbid obesity with Body mass index is 37.41 kg/m.   Not sure if it is feasible for him to lose any more weight.   XBL:TJQZE w/ his brother 11/21-who states he is a Product manager.Prognosis remains guarded at this time patient is DNR.He is at risk of decompensation in the setting of severe sepsis, abdominal distention bowel obstruction along with hypoxic respiratory failure and aspiration.Spoke to his brother and is updated previously.  Palliative care has been consulted and appreciate input.    Nutrition: Diet Order            Diet NPO time specified  Diet effective now                 DVT prophylaxis: SCDs Start: 05/11/20 0142 Code Status:   Code Status: DNR  Family Communication: plan of care discussed with patient's brother on the phone previously.  Status is: Inpatient. Remains inpatient appropriate because: Ongoing management of abdominal distention renal failure respiratory failure, remains on IVF,unable to take p.o.  Dispo: The patient is from: SNF              Anticipated d/c is to: SNF              Anticipated d/c date is: > 3 days              Patient currently is not medically stable to d/c. Consultants:see note  Procedures:see note  Culture/Microbiology    Component Value Date/Time   SDES LEFT ANTECUBITAL 05/10/2020 2230   SPECREQUEST  05/10/2020 2230    BOTTLES DRAWN AEROBIC AND ANAEROBIC Blood Culture results may not be optimal due to an inadequate volume of blood received in culture bottles   CULT  05/10/2020 2230    NO GROWTH 3 DAYS Performed at [facility],  [address] [place], [place] [address]    REPTSTATUS PENDING 05/10/2020 2230    Other culture-see note  Medications: Scheduled Meds: . Chlorhexidine Gluconate Cloth  6 each Topical Q0600  . enoxaparin (LOVENOX) injection  110 mg Subcutaneous Q12H  . insulin aspart  0-9 Units Subcutaneous Q4H  . ipratropium-albuterol  3 mL Nebulization BID  . mupirocin ointment  1 application Nasal BID   Continuous Infusions: . ceFEPime (MAXIPIME) IV 2 g (05/12/20 2105)    . dextrose 75 mL/hr at 05/13/20 0458  . metronidazole 500 mg (05/13/20 0321)  . vancomycin 1,250 mg (05/12/20 2145)    Antimicrobials: Anti-infectives (From admission, onward)   Start     Dose/Rate Route Frequency Ordered Stop   05/11/20 2200  vancomycin (VANCOREADY) IVPB 1250 mg/250 mL        1,250 mg 166.7 mL/hr over 90 Minutes Intravenous Every 24 hours 05/11/20 0316     05/11/20 1000  ceFEPIme (MAXIPIME) 2 g in sodium chloride 0.9 % 100 mL IVPB        2 g 200 mL/hr over 30 Minutes Intravenous Every 12 hours 05/11/20 0316     05/11/20 0300  metroNIDAZOLE (FLAGYL) IVPB 500 mg        500 mg 100 mL/hr over 60 Minutes Intravenous Every 8 hours 05/11/20 0214 05/18/20 0259   05/10/20 2215  aztreonam (AZACTAM) 2 g in sodium chloride 0.9 % 100 mL IVPB  Status:  Discontinued        2 g 200 mL/hr over 30 Minutes Intravenous  Once 05/10/20 2205 05/10/20 2211   05/10/20 2215  vancomycin (VANCOCIN) IVPB 1000 mg/200 mL premix  Status:  Discontinued        1,000 mg 200 mL/hr over 60 Minutes Intravenous  Once 05/10/20 2205 05/10/20 2207   05/10/20 2215  vancomycin (VANCOREADY) IVPB 2000 mg/400 mL        2,000 mg 200 mL/hr over 120 Minutes Intravenous  Once 05/10/20 2207 05/11/20 0050   05/10/20 2215  ceFEPIme (MAXIPIME) 2 g in sodium chloride 0.9 % 100 mL IVPB        2 g 200 mL/hr over 30 Minutes Intravenous  Once 05/10/20 2211 05/10/20 2328     Objective: Vitals: Today's Vitals   05/12/20 2026 05/12/20 2100 05/13/20 0451 05/13/20 0507  BP: (!) 141/95  133/84   Pulse: 98  95   Resp: 19  19   Temp: 98.8 F (37.1 C)  99.8 F (37.7 C)   TempSrc:      SpO2: 95%  91% 92%  Weight:      Height:      PainSc:  0-No pain      Intake/Output Summary (Last 24 hours) at 05/13/2020 0716 Last data filed at 05/13/2020 0500 Gross per 24 hour  Intake 1093.73 ml  Output 2200 ml  Net -1106.27 ml   Filed Weights   05/10/20 2122  Weight: 111.6 kg   Weight change:   Intake/Output from  previous day: 11/22 0701 - 11/23 0700 In: 1093.7 [I.V.:1093.7] Out: 2200 [Urine:2200] Intake/Output this shift: No intake/output data recorded.  Examination: General exam: AAOx2 , NAD, weak appearing. HEENT:Oral mucosa moist, Ear/Nose WNL grossly, dentition normal. Respiratory system: bilaterally crackles,no wheezing or crackles,no use of accessory muscle Cardiovascular system: S1 & S2 +, No JVD,. Gastrointestinal system: Abdomen soft,distended,BS+ Nervous System:Alert, awake, follows commands. Extremities: No edema, distal peripheral pulses palpable.  Skin: No rashes,no icterus. MSK: Normal muscle bulk,tone, power  Data Reviewed: I have personally reviewed  following labs and imaging studies CBC: Recent Labs  Lab 05/10/20 0121 05/10/20 2141 05/11/20 0544 05/12/20 0456 05/13/20 0402  WBC 9.6 18.8* 27.2* 15.7* 12.3*  NEUTROABS 6.3 17.5*  --   --   --   HGB 12.4* 12.4* 11.8* 11.5* 11.9*  HCT 43.6 44.8 42.8 41.2 40.2  MCV 106.3* 110.1* 112.0* 107.9* 103.9*  PLT 235 229 211 197 203   Basic Metabolic Panel: Recent Labs  Lab 05/10/20 2141 05/11/20 0544 05/11/20 1340 05/12/20 0456 05/13/20 0402  NA 147* 148* 148* 151* 150*  K 4.4 5.3* 4.9 3.9 3.4*  CL 102 103 105 109 108  CO2 38* 36* 38* 34* 33*  GLUCOSE 246* 173* 130* 114* 137*  BUN 41* 42* 40* 36* 27*  CREATININE 1.65* 1.78* 1.60* 1.37* 1.29*  CALCIUM 8.5* 8.3* 8.1* 8.3* 8.1*  MG  --  2.3  --   --   --   PHOS  --  4.7*  --   --   --    GFR: Estimated Creatinine Clearance: 60.9 mL/min (A) (by C-G formula based on SCr of 1.29 mg/dL (H)). Liver Function Tests: Recent Labs  Lab 05/10/20 0121 05/11/20 0544 05/12/20 0456 05/13/20 0402  AST 19 68* 30 30  ALT 18 55* 37 29  ALKPHOS 51 52 48 47  BILITOT 0.3 0.6 0.3 0.4  PROT 7.7 7.3 7.2 7.0  ALBUMIN 3.2* 3.0* 2.9* 2.8*   No results for input(s): LIPASE, AMYLASE in the last 168 hours. No results for input(s): AMMONIA in the last 168 hours. Coagulation  Profile: Recent Labs  Lab 05/10/20 2141 05/11/20 0544  INR 1.3* 1.3*   Cardiac Enzymes: No results for input(s): CKTOTAL, CKMB, CKMBINDEX, TROPONINI in the last 168 hours. BNP (last 3 results) No results for input(s): PROBNP in the last 8760 hours. HbA1C: No results for input(s): HGBA1C in the last 72 hours. CBG: Recent Labs  Lab 05/12/20 1132 05/12/20 1600 05/12/20 2027 05/13/20 0019 05/13/20 0442  GLUCAP 152* 136* 138* 148* 125*   Lipid Profile: No results for input(s): CHOL, HDL, LDLCALC, TRIG, CHOLHDL, LDLDIRECT in the last 72 hours. Thyroid Function Tests: No results for input(s): TSH, T4TOTAL, FREET4, T3FREE, THYROIDAB in the last 72 hours. Anemia Panel: No results for input(s): VITAMINB12, FOLATE, FERRITIN, TIBC, IRON, RETICCTPCT in the last 72 hours. Sepsis Labs: Recent Labs  Lab 05/10/20 2141 05/10/20 2320 05/11/20 0544 05/11/20 0916  LATICACIDVEN 2.6* 2.2* 1.6 1.3    Recent Results (from the past 240 hour(s))  Resp Panel by RT-PCR (Flu A&B, Covid) Nasopharyngeal Swab     Status: None   Collection Time: 05/09/20 11:59 PM   Specimen: Nasopharyngeal Swab; Nasopharyngeal(NP) swabs in vial transport medium  Result Value Ref Range Status   SARS Coronavirus 2 by RT PCR NEGATIVE NEGATIVE Final    Comment: (NOTE) SARS-CoV-2 target nucleic acids are NOT DETECTED.  The SARS-CoV-2 RNA is generally detectable in upper respiratory specimens during the acute phase of infection. The lowest concentration of SARS-CoV-2 viral copies this assay can detect is 138 copies/mL. A negative result does not preclude SARS-Cov-2 infection and should not be used as the sole basis for treatment or other patient management decisions. A negative result may occur with  improper specimen collection/handling, submission of specimen other than nasopharyngeal swab, presence of viral mutation(s) within the areas targeted by this assay, and inadequate number of viral copies(<138 copies/mL).  A negative result must be combined with clinical observations, patient history, and epidemiological information. The expected result is  Negative.  Fact Sheet for Patients:  BloggerCourse.comhttps://www.fda.gov/media/152166/download  Fact Sheet for Healthcare Providers:  SeriousBroker.ithttps://www.fda.gov/media/152162/download  This test is no t yet approved or cleared by the [place] FDA and  has been authorized for detection and/or diagnosis of SARS-CoV-2 by FDA under an Emergency Use Authorization (EUA). This EUA will remain  in effect (meaning this test can be used) for the duration of the COVID-19 declaration under Section 564(b)(1) of the Act, 21 U.S.C.section 360bbb-3(b)(1), unless the authorization is terminated  or revoked sooner.       Influenza A by PCR NEGATIVE NEGATIVE Final   Influenza B by PCR NEGATIVE NEGATIVE Final    Comment: (NOTE) The Xpert Xpress SARS-CoV-2/FLU/RSV plus assay is intended as an aid in the diagnosis of influenza from Nasopharyngeal swab specimens and should not be used as a sole basis for treatment. Nasal washings and aspirates are unacceptable for Xpert Xpress SARS-CoV-2/FLU/RSV testing.  Fact Sheet for Patients: BloggerCourse.comhttps://www.fda.gov/media/152166/download  Fact Sheet for Healthcare Providers: SeriousBroker.ithttps://www.fda.gov/media/152162/download  This test is not yet approved or cleared by the [place] FDA and has been authorized for detection and/or diagnosis of SARS-CoV-2 by FDA under an Emergency Use Authorization (EUA). This EUA will remain in effect (meaning this test can be used) for the duration of the COVID-19 declaration under Section 564(b)(1) of the Act, 21 U.S.C. section 360bbb-3(b)(1), unless the authorization is terminated or revoked.  Performed at [facility], [address] [place], [place] [address]   Culture, blood (routine x 2)     Status: Abnormal (Preliminary result)   Collection Time: 05/10/20  1:21 AM   Specimen: BLOOD RIGHT HAND  Result Value  Ref Range Status   Specimen Description   Final    BLOOD RIGHT HAND Performed at [facility], [address] [place], [place] [address]    Special Requests   Final    BOTTLES DRAWN AEROBIC AND ANAEROBIC Blood Culture adequate volume Performed at [facility], [address] [place], [place] [address]    Culture  Setup Time   Final    GRAM POSITIVE COCCI Gram Stain Report Called to,Read Back By and Verified With: WATLINGTON,C @ 2014 ON 05/10/20 BY JUW ANAEROBIC BOTTLE ONLY GS DONE @ [facility] CRITICAL RESULT CALLED TO, READ BACK BY AND VERIFIED WITH: C. TURNER,RN [id number] 05/11/2020 T. TYSOR    Culture (A)  Final    STAPHYLOCOCCUS HOMINIS THE SIGNIFICANCE OF ISOLATING THIS ORGANISM FROM A SINGLE SET OF BLOOD CULTURES WHEN MULTIPLE SETS ARE DRAWN IS UNCERTAIN. PLEASE NOTIFY THE MICROBIOLOGY DEPARTMENT WITHIN ONE WEEK IF SPECIATION AND SENSITIVITIES ARE REQUIRED. Performed at [facility], [address] [place], [place] [address]    Report Status PENDING  Incomplete  Blood Culture ID Panel (Reflexed)     Status: Abnormal   Collection Time: 05/10/20  1:21 AM  Result Value Ref Range Status   Enterococcus faecalis NOT DETECTED NOT DETECTED Final   Enterococcus Faecium NOT DETECTED NOT DETECTED Final   Listeria monocytogenes NOT DETECTED NOT DETECTED Final   Staphylococcus species DETECTED (A) NOT DETECTED Final    Comment: CRITICAL RESULT CALLED TO, READ BACK BY AND VERIFIED WITH: Luiz Ochoa. TURNER,RN [id number] 05/11/2020 T. TYSOR    Staphylococcus aureus (BCID) NOT DETECTED NOT DETECTED Final   Staphylococcus epidermidis NOT DETECTED NOT DETECTED Final   Staphylococcus lugdunensis NOT DETECTED NOT DETECTED Final   Streptococcus species NOT DETECTED NOT DETECTED Final   Streptococcus agalactiae NOT DETECTED NOT DETECTED Final   Streptococcus pneumoniae NOT DETECTED NOT  DETECTED Final   Streptococcus pyogenes NOT DETECTED NOT DETECTED Final   A.calcoaceticus-baumannii NOT DETECTED NOT DETECTED  Final   Bacteroides fragilis NOT DETECTED NOT DETECTED Final   Enterobacterales NOT DETECTED NOT DETECTED Final   Enterobacter cloacae complex NOT DETECTED NOT DETECTED Final   Escherichia coli NOT DETECTED NOT DETECTED Final   Klebsiella aerogenes NOT DETECTED NOT DETECTED Final   Klebsiella oxytoca NOT DETECTED NOT DETECTED Final   Klebsiella pneumoniae NOT DETECTED NOT DETECTED Final   Proteus species NOT DETECTED NOT DETECTED Final   Salmonella species NOT DETECTED NOT DETECTED Final   Serratia marcescens NOT DETECTED NOT DETECTED Final   Haemophilus influenzae NOT DETECTED NOT DETECTED Final   Neisseria meningitidis NOT DETECTED NOT DETECTED Final   Pseudomonas aeruginosa NOT DETECTED NOT DETECTED Final   Stenotrophomonas maltophilia NOT DETECTED NOT DETECTED Final   Candida albicans NOT DETECTED NOT DETECTED Final   Candida auris NOT DETECTED NOT DETECTED Final   Candida glabrata NOT DETECTED NOT DETECTED Final   Candida krusei NOT DETECTED NOT DETECTED Final   Candida parapsilosis NOT DETECTED NOT DETECTED Final   Candida tropicalis NOT DETECTED NOT DETECTED Final   Cryptococcus neoformans/gattii NOT DETECTED NOT DETECTED Final    Comment: Performed at [facility], [address], [place], [place] [address]  Culture, blood (routine x 2)     Status: Abnormal (Preliminary result)   Collection Time: 05/10/20  1:29 AM   Specimen: BLOOD LEFT HAND  Result Value Ref Range Status   Specimen Description   Final    BLOOD LEFT HAND Performed at [facility], [address], , [place] [address]    Special Requests   Final    BOTTLES DRAWN AEROBIC AND ANAEROBIC Blood Culture adequate volume Performed at [facility], [address] [place], [place] [address]    Culture  Setup Time   Final    GRAM POSITIVE COCCI Gram Stain Report Called to,Read Back By and Verified With: TURNER,C @ 2333 ON 05/10/20 BY JUW IN BOTH AEROBIC AND ANAEROBIC BOTTLES GS DONE @ [facility] CRITICAL VALUE  NOTED.  VALUE IS CONSISTENT WITH PREVIOUSLY REPORTED AND CALLED VALUE.    Culture (A)  Final    STAPHYLOCOCCUS CAPITIS THE SIGNIFICANCE OF ISOLATING THIS ORGANISM FROM A SINGLE SET OF BLOOD CULTURES WHEN MULTIPLE SETS ARE DRAWN IS UNCERTAIN. PLEASE NOTIFY THE MICROBIOLOGY DEPARTMENT WITHIN ONE WEEK IF SPECIATION AND SENSITIVITIES ARE REQUIRED. Performed at [facility], [address] [place], [place] [address]    Report Status PENDING  Incomplete  Urine Culture     Status: Abnormal   Collection Time: 05/10/20  9:37 AM   Specimen: Urine, Catheterized  Result Value Ref Range Status   Specimen Description   Final    URINE, CATHETERIZED Performed at [facility], [address], [place], [place] [address]    Special Requests   Final    NONE Performed at [facility], [address] [place], [place] [address]    Culture >=100,000 COLONIES/mL CITROBACTER KOSERI (A)  Final   Report Status 05/12/2020 FINAL  Final   Organism ID, Bacteria CITROBACTER KOSERI (A)  Final      Susceptibility   Citrobacter koseri - MIC*    CEFAZOLIN >=64 RESISTANT Resistant     CEFEPIME <=0.12 SENSITIVE Sensitive     CEFTRIAXONE >=64 RESISTANT Resistant     CIPROFLOXACIN >=4 RESISTANT Resistant     GENTAMICIN <=1 SENSITIVE Sensitive     IMIPENEM 0.5 SENSITIVE  Sensitive     NITROFURANTOIN 64 INTERMEDIATE Intermediate     TRIMETH/SULFA <=20 SENSITIVE Sensitive     PIP/TAZO 16 SENSITIVE Sensitive     * >=100,000 COLONIES/mL CITROBACTER KOSERI  Culture, blood (Routine X 2) w Reflex to ID Panel     Status: None (Preliminary result)   Collection Time: 05/10/20  9:41 PM   Specimen: Right Antecubital; Blood  Result Value Ref Range Status   Specimen Description   Final    RIGHT ANTECUBITAL Performed at [facility], [address] [place], [place] [address]    Special Requests   Final    BOTTLES DRAWN AEROBIC AND ANAEROBIC Blood Culture adequate volume Performed at [facility], [address]  [place], [place] [address]    Culture  Setup Time   Final    GRAM POSITIVE COCCI IN CLUSTERS RECOVERED FROM THE ANAEROBIC BOTTLE Gram Stain Report Called to,Read Back By and Verified With: WRIGHT,EMILY AT 1314 ON 05/12/2020 BY BAUGHAM,M. Performed at [facility] CRITICAL RESULT CALLED TO, READ BACK BY AND VERIFIED WITHCorliss Blacker RN [id number] 05/12/20 A BROWNING    Culture   Final    CULTURE REINCUBATED FOR BETTER GROWTH Performed at [facility], [address] [place], [place] [address]    Report Status PENDING  Incomplete  Blood Culture ID Panel (Reflexed)     Status: Abnormal   Collection Time: 05/10/20  9:41 PM  Result Value Ref Range Status   Enterococcus faecalis NOT DETECTED NOT DETECTED Final   Enterococcus Faecium NOT DETECTED NOT DETECTED Final   Listeria monocytogenes NOT DETECTED NOT DETECTED Final   Staphylococcus species DETECTED (A) NOT DETECTED Final    Comment: CRITICAL RESULT CALLED TO, READ BACK BY AND VERIFIED WITHCorliss Blacker RN [id number] 05/12/20 A BROWNING    Staphylococcus aureus (BCID) NOT DETECTED NOT DETECTED Final   Staphylococcus epidermidis NOT DETECTED NOT DETECTED Final   Staphylococcus lugdunensis NOT DETECTED NOT DETECTED Final   Streptococcus species NOT DETECTED NOT DETECTED Final   Streptococcus agalactiae NOT DETECTED NOT DETECTED Final   Streptococcus pneumoniae NOT DETECTED NOT DETECTED Final   Streptococcus pyogenes NOT DETECTED NOT DETECTED Final   A.calcoaceticus-baumannii NOT DETECTED NOT DETECTED Final   Bacteroides fragilis NOT DETECTED NOT DETECTED Final   Enterobacterales NOT DETECTED NOT DETECTED Final   Enterobacter cloacae complex NOT DETECTED NOT DETECTED Final   Escherichia coli NOT DETECTED NOT DETECTED Final   Klebsiella aerogenes NOT DETECTED NOT DETECTED Final   Klebsiella oxytoca NOT DETECTED NOT DETECTED Final   Klebsiella pneumoniae NOT DETECTED NOT DETECTED Final   Proteus species NOT DETECTED NOT DETECTED Final    Salmonella species NOT DETECTED NOT DETECTED Final   Serratia marcescens NOT DETECTED NOT DETECTED Final   Haemophilus influenzae NOT DETECTED NOT DETECTED Final   Neisseria meningitidis NOT DETECTED NOT DETECTED Final   Pseudomonas aeruginosa NOT DETECTED NOT DETECTED Final   Stenotrophomonas maltophilia NOT DETECTED NOT DETECTED Final   Candida albicans NOT DETECTED NOT DETECTED Final   Candida auris NOT DETECTED NOT DETECTED Final   Candida glabrata NOT DETECTED NOT DETECTED Final   Candida krusei NOT DETECTED NOT DETECTED Final   Candida parapsilosis NOT DETECTED NOT DETECTED Final   Candida tropicalis NOT DETECTED NOT DETECTED Final   Cryptococcus neoformans/gattii NOT DETECTED NOT DETECTED Final    Comment: Performed at [facility], [address], [place], [place] [address]  Urine culture     Status: None  Collection Time: 05/10/20 10:02 PM   Specimen: In/Out Cath Urine  Result Value Ref Range Status   Specimen Description   Final    IN/OUT CATH URINE Performed at [facility], [address], [place], [place] [address]    Special Requests   Final    NONE Performed at [facility], [address], [place], [place] [address]    Culture   Final    NO GROWTH Performed at [facility], [address], [place], [place] [address]    Report Status 05/12/2020 FINAL  Final  Culture, blood (Routine X 2) w Reflex to ID Panel     Status: None (Preliminary result)   Collection Time: 05/10/20 10:30 PM   Specimen: Left Antecubital; Blood  Result Value Ref Range Status   Specimen Description LEFT ANTECUBITAL  Final   Special Requests   Final    BOTTLES DRAWN AEROBIC AND ANAEROBIC Blood Culture results may not be optimal due to an inadequate volume of blood received in culture bottles   Culture   Final    NO GROWTH 3 DAYS Performed at [facility], [address] [place], [place] [address]    Report Status PENDING  Incomplete  MRSA PCR Screening     Status: Abnormal    Collection Time: 05/11/20  2:14 AM   Specimen: Nasal Mucosa; Nasopharyngeal  Result Value Ref Range Status   MRSA by PCR POSITIVE (A) NEGATIVE Final    Comment:        The GeneXpert MRSA Assay (FDA approved for NASAL specimens only), is one component of a comprehensive MRSA colonization surveillance program. It is not intended to diagnose MRSA infection nor to guide or monitor treatment for MRSA infections. RESULT CALLED TO, READ BACK BY AND VERIFIED WITH: SINSKIE,P @ [phone] ON 05/11/20 BY JUW Performed at [facility], [address], [place], [place] [address]      Radiology Studies: DG Abd Portable 1V  Result Date: 05/12/2020 CLINICAL DATA:  Bowel obstruction. EXAM: PORTABLE ABDOMEN - 1 VIEW COMPARISON:  CT abdomen and pelvis 05/10/2020 FINDINGS: There is a moderate amount of stool in the colon. There is persistent gaseous distension of redundant sigmoid colon which has mildly increased from the recent CT. No dilated small bowel loops are seen. There is mild lumbar levoscoliosis. IMPRESSION: Mildly increased gaseous dilatation of redundant sigmoid colon. Moderate colonic stool burden. Electronically Signed   By: Sebastian Ache M.D.   On: 05/12/2020 08:40    LOS: 2 days    Time spent 42 minutes in the care of this patient  Lanae Boast, MD [facility]  05/13/2020, 7:16 AM

## 2020-05-13 NOTE — Progress Notes (Signed)
Palliative: [name] [name] is resting quietly in bed.  He appears chronically ill and frail.  He wakes easily when I call his name, making but not keeping eye contact.  He had a stroke approximately 18 years ago and has been in long-term care since that time.  He has dysarthria and dysphagia.  I believe that he is able to make his basic needs known if asked simple yes and no questions.  There is no family at bedside at this time.  [name] [name] x-ray results reviewed.  There is persistent gas in the sigmoid colon and a moderate volume of stool throughout the colon which is slightly improved from prior.  [name] [name] belly is soft, but still distended.  He does not attempt to guard as I palpate.  We talked about disposition, and I share that when he is better, he will return to [facility] nursing center where he has been in long-term care for approximately 15 years.  He denies approval.  Conference with attending, bedside nursing staff, transition of care team related to patient condition, needs, goals of care. [name] [name] is active with outpatient palliative services through hospice of [facility]  Plan: Continue to treat the treatable but no CPR or intubation.  Rehospitalize as needed.  Continue outpatient palliative services.  15 minutes Lillia Carmel, NP Palliative Medicine Team Team Phone # [phone] Greater than 50% of this time was spent counseling and coordinating care related to the above assessment and plan.

## 2020-05-13 NOTE — Progress Notes (Signed)
Subjective: Arousable.  Objective: Vital signs in last 24 hours: Temp:  [98.8 F (37.1 C)-99.8 F (37.7 C)] 99.8 F (37.7 C) (11/23 0451) Pulse Rate:  [86-107] 95 (11/23 0451) Resp:  [16-20] 19 (11/23 0451) BP: (133-151)/(84-134) 133/84 (11/23 0451) SpO2:  [91 %-97 %] 94 % (11/23 0724) Last BM Date: 05/12/20  Intake/Output from previous day: 11/22 0701 - 11/23 0700 In: 1093.7 [I.V.:1093.7] Out: 2200 [Urine:2200] Intake/Output this shift: No intake/output data recorded.  General appearance: cooperative and no distress GI: Rotund but soft. Less distention noted. Appears to be back to baseline. Flexi-Seal with stool present.  Lab Results:  Recent Labs    05/12/20 0456 05/13/20 0402  WBC 15.7* 12.3*  HGB 11.5* 11.9*  HCT 41.2 40.2  PLT 197 203   BMET Recent Labs    05/12/20 0456 05/13/20 0402  NA 151* 150*  K 3.9 3.4*  CL 109 108  CO2 34* 33*  GLUCOSE 114* 137*  BUN 36* 27*  CREATININE 1.37* 1.29*  CALCIUM 8.3* 8.1*   PT/INR Recent Labs    05/10/20 2141 05/11/20 0544  LABPROT 15.9* 16.0*  INR 1.3* 1.3*    Studies/Results: DG Abd Portable 1V  Result Date: 05/13/2020 CLINICAL DATA:  Abdominal distension EXAM: PORTABLE ABDOMEN - 1 VIEW COMPARISON:  05/12/2020 FINDINGS: Persistent marked gaseous distension of the sigmoid colon. Moderate volume of stool projects throughout the colon, slightly improved from prior. No dilated loops of small bowel. No gross free intraperitoneal air on supine imaging. Degenerative changes of the spine. IMPRESSION: 1. Persistent marked gaseous distension of the sigmoid colon. 2. Moderate volume of stool throughout the colon, slightly improved from prior. Electronically Signed   By: Duanne Guess D.O.   On: 05/13/2020 08:24   DG Abd Portable 1V  Result Date: 05/12/2020 CLINICAL DATA:  Bowel obstruction. EXAM: PORTABLE ABDOMEN - 1 VIEW COMPARISON:  CT abdomen and pelvis 05/10/2020 FINDINGS: There is a moderate amount of stool  in the colon. There is persistent gaseous distension of redundant sigmoid colon which has mildly increased from the recent CT. No dilated small bowel loops are seen. There is mild lumbar levoscoliosis. IMPRESSION: Mildly increased gaseous dilatation of redundant sigmoid colon. Moderate colonic stool burden. Electronically Signed   By: Sebastian Ache M.D.   On: 05/12/2020 08:40    Anti-infectives: Anti-infectives (From admission, onward)   Start     Dose/Rate Route Frequency Ordered Stop   05/13/20 0900  meropenem (MERREM) 1 g in sodium chloride 0.9 % 100 mL IVPB        1 g 200 mL/hr over 30 Minutes Intravenous Every 8 hours 05/13/20 0808     05/11/20 2200  vancomycin (VANCOREADY) IVPB 1250 mg/250 mL        1,250 mg 166.7 mL/hr over 90 Minutes Intravenous Every 24 hours 05/11/20 0316     05/11/20 1000  ceFEPIme (MAXIPIME) 2 g in sodium chloride 0.9 % 100 mL IVPB  Status:  Discontinued        2 g 200 mL/hr over 30 Minutes Intravenous Every 12 hours 05/11/20 0316 05/13/20 0808   05/11/20 0300  metroNIDAZOLE (FLAGYL) IVPB 500 mg  Status:  Discontinued        500 mg 100 mL/hr over 60 Minutes Intravenous Every 8 hours 05/11/20 0214 05/13/20 0808   05/10/20 2215  aztreonam (AZACTAM) 2 g in sodium chloride 0.9 % 100 mL IVPB  Status:  Discontinued        2 g 200 mL/hr over 30 Minutes  Intravenous  Once 05/10/20 2205 05/10/20 2211   05/10/20 2215  vancomycin (VANCOCIN) IVPB 1000 mg/200 mL premix  Status:  Discontinued        1,000 mg 200 mL/hr over 60 Minutes Intravenous  Once 05/10/20 2205 05/10/20 2207   05/10/20 2215  vancomycin (VANCOREADY) IVPB 2000 mg/400 mL        2,000 mg 200 mL/hr over 120 Minutes Intravenous  Once 05/10/20 2207 05/11/20 0050   05/10/20 2215  ceFEPIme (MAXIPIME) 2 g in sodium chloride 0.9 % 100 mL IVPB        2 g 200 mL/hr over 30 Minutes Intravenous  Once 05/10/20 2211 05/10/20 2328      Assessment/Plan: Impression: Patient's GI function appears to be returning to  baseline. He is having multiple bowel movements and stool output. His abdomen is less distended. No need for surgical intervention. Any surgical intervention would be at extremely high risk for this patient given his multiple comorbidities. Plan: Continue current management.  LOS: 2 days    Franky Macho 05/13/2020

## 2020-05-13 NOTE — Progress Notes (Signed)
Notified by nursing staff that enema is on hold. Agree with plan as he has started having multiple BMs this morning.

## 2020-05-13 NOTE — Progress Notes (Signed)
Pharmacy Antibiotic Note  [name] [name] is a 74 y.o. male admitted on 05/10/2020 with sepsis.  Pharmacy has been consulted for Vancomycin/Merrem dosing. WBC is elevated but improved. Noted PCN allergy, has tolerated cefepime before. Concerned with aspiration PNA and with acute on chronic respiratory failure. CT imaging showed fecal impaction within the distal sigmoid colon and subsequent distal large bowel obstruction. Leukocytosis is improving. F/U deescalation of abx as indicated  Plan: Continue Vancomycin 1250 mg IV q24h Merrem 1g IV q8h F/U cxs and clinical progress  Monitor V/S, labs and levels as indicated  Height: 5\' 8"  (172.7 cm) Weight: 111.6 kg (246 lb 0.5 oz) IBW/kg (Calculated) : 68.4  Temp (24hrs), Avg:99.3 F (37.4 C), Min:98.8 F (37.1 C), Max:99.8 F (37.7 C)  Recent Labs  Lab 05/10/20 0121 05/10/20 0121 05/10/20 2141 05/10/20 2320 05/11/20 0544 05/11/20 0916 05/11/20 1340 05/12/20 0456 05/13/20 0402  WBC 9.6  --  18.8*  --  27.2*  --   --  15.7* 12.3*  CREATININE 1.45*   < > 1.65*  --  1.78*  --  1.60* 1.37* 1.29*  LATICACIDVEN  --   --  2.6* 2.2* 1.6 1.3  --   --   --    < > = values in this interval not displayed.    Estimated Creatinine Clearance: 60.9 mL/min (A) (by C-G formula based on SCr of 1.29 mg/dL (H)).    Allergies  Allergen Reactions  . Penicillin G   . Penicillins     Has patient had a PCN reaction causing immediate rash, facial/tongue/throat swelling, SOB or lightheadedness with hypotension: unknown Has patient had a PCN reaction causing severe rash involving mucus membranes or skin necrosis: unknown Has patient had a PCN reaction that required hospitalization: unknown Has patient had a PCN reaction occurring within the last 10 years: unknown If all of the above answers are "NO", then may proceed with Cephalosporin use. 5/3 Tolerated Cefepime x 1 dose in ED.    Antibiotics: Merrem 11/23>. Cefepime 11/20 >>11/23 Flagyl 11/21 >>  11/23 Vanco 11/20 >>  11/20 Bcx: staph hominis in 1 bottle, Staph capitis  in 1 bottle 11/20 BCID: staph species- likely contamination  11/20 Ucx cath: Citrobacter koseri >100K CFU/ml S- gen, imipenem, piptazo, septra R-ceftriaxone, cipro 11/20 Ucx:(in/out cath) no growth 11/20 MRSA PCR: positive  12/20, BS Elder Cyphers, BCPS Clinical Pharmacist Pager [phone]

## 2020-05-13 NOTE — Telephone Encounter (Signed)
Post ED Visit - Positive Culture Follow-up  Culture report reviewed by antimicrobial stewardship pharmacist: [facility] []  , Pharm.D. []  Enzo Bi, Pharm.D., BCPS AQ-ID []  , Pharm.D., BCPS []  Celedonio Miyamoto, Pharm.D., BCPS []  [place], Garvin Fila.D., BCPS, AAHIVP []  , Pharm.D., BCPS, AAHIVP []  Georgina Pillion, PharmD, BCPS []  , PharmD, BCPS []  [place], PharmD, BCPS []  [address], PharmD []  , PharmD, BCPS []  Estella Husk, PharmD [facility] []  Lysle Pearl, PharmD []  , PharmD []  Phillips Climes, PharmD []  , Rph []  Agapito Games) , PharmD []  Verlan Friends, PharmD []  , PharmD []  Mervyn Gay, PharmD []  , PharmD []  Vinnie Level, PharmD []  Bufford Lope, PharmD []  , PharmD []  Len Childs, PharmD   Positive urine culture Admited on ABX  05/13/2020, 10:13 AM

## 2020-05-13 NOTE — Plan of Care (Signed)

## 2020-05-13 NOTE — Progress Notes (Signed)
Subjective: More alert this morning. No distress. Denies abdominal pain or nausea. Limited historian in setting of dysarthria and dementia. Per nursing staff, no BMs overnight, small smear.   Objective: Vital signs in last 24 hours: Temp:  [98.8 F (37.1 C)-99.8 F (37.7 C)] 99.8 F (37.7 C) (11/23 0451) Pulse Rate:  [86-107] 95 (11/23 0451) Resp:  [16-20] 19 (11/23 0451) BP: (133-151)/(84-134) 133/84 (11/23 0451) SpO2:  [91 %-97 %] 94 % (11/23 0724) Last BM Date: 05/12/20 General:   Alert and oriented, pleasant, dysarthric speech, no distress Abdomen:  Remains distended but improved from my assessment yesterday, soft, no TTP, protruding umbilical hernia appreciated, hypoactive bowel sounds Neurologic:  Alert to person, unable to assess orientation Skin:  Warm and dry, intact without significant lesions.  Cervical Nodes:  No significant cervical adenopathy. Psych:  Alert and cooperative. Normal mood and affect.  Intake/Output from previous day: 11/22 0701 - 11/23 0700 In: 1093.7 [I.V.:1093.7] Out: 2200 [Urine:2200] Intake/Output this shift: No intake/output data recorded.  Lab Results: Recent Labs    05/11/20 0544 05/12/20 0456 05/13/20 0402  WBC 27.2* 15.7* 12.3*  HGB 11.8* 11.5* 11.9*  HCT 42.8 41.2 40.2  PLT 211 197 203   BMET Recent Labs    05/11/20 1340 05/12/20 0456 05/13/20 0402  NA 148* 151* 150*  K 4.9 3.9 3.4*  CL 105 109 108  CO2 38* 34* 33*  GLUCOSE 130* 114* 137*  BUN 40* 36* 27*  CREATININE 1.60* 1.37* 1.29*  CALCIUM 8.1* 8.3* 8.1*   LFT Recent Labs    05/11/20 0544 05/12/20 0456 05/13/20 0402  PROT 7.3 7.2 7.0  ALBUMIN 3.0* 2.9* 2.8*  AST 68* 30 30  ALT 55* 37 29  ALKPHOS 52 48 47  BILITOT 0.6 0.3 0.4   PT/INR Recent Labs    05/10/20 2141 05/11/20 0544  LABPROT 15.9* 16.0*  INR 1.3* 1.3*    Studies/Results: DG Abd Portable 1V  Result Date: 05/12/2020 CLINICAL DATA:  Bowel obstruction. EXAM: PORTABLE ABDOMEN - 1 VIEW  COMPARISON:  CT abdomen and pelvis 05/10/2020 FINDINGS: There is a moderate amount of stool in the colon. There is persistent gaseous distension of redundant sigmoid colon which has mildly increased from the recent CT. No dilated small bowel loops are seen. There is mild lumbar levoscoliosis. IMPRESSION: Mildly increased gaseous dilatation of redundant sigmoid colon. Moderate colonic stool burden. Electronically Signed   By: Sebastian Ache M.D.   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   Abdominal xray yesterday with mildly increased gaseous dilatation of redundant sigmoid colon and moderate colonic burden. Several BMs yesterday, and flexi-seal placed to assist with decompression. Holding off on flex sig at this time. On physical exam, remains distended but improved from yesterday. I am unsure of his baseline as an outpatient. Abdominal xray to be repeated today. No BMs overnight: nursing staff to give enema this morning. 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   Recommend serial abdominal xrays. After review of today's xray, hopefully can resume  diet. Per Speech, recommending D1/puree and honey thick liquids in setting of dysphagia due to prior strokes. High risk for aspiration. Once diet resumed, recommend resuming Senna, which he was on as an outpatient.   High risk for recurrent sigmoid volvulus and needs stat repeat imaging if worsening  symptoms/pain.   Plan: Continue flexi-seal for decompression Abdominal xray today Pending review of xray, hopefully can start advancing diet Resume outpatient regimen (including Senna) once diet advanced Enema today per nursing Repeat CT if worsening symptoms Will continue to follow with you   Gelene Mink, PhD, ANP-BC [facility]    LOS: 2 days    05/13/2020, 7:50 AM

## 2020-05-14 DIAGNOSIS — I639 Cerebral infarction, unspecified: Secondary | ICD-10-CM

## 2020-05-14 DIAGNOSIS — K56609 Unspecified intestinal obstruction, unspecified as to partial versus complete obstruction: Secondary | ICD-10-CM

## 2020-05-14 DIAGNOSIS — E870 Hyperosmolality and hypernatremia: Secondary | ICD-10-CM

## 2020-05-14 DIAGNOSIS — R140 Abdominal distension (gaseous): Secondary | ICD-10-CM

## 2020-05-14 DIAGNOSIS — A419 Sepsis, unspecified organism: Secondary | ICD-10-CM

## 2020-05-14 DIAGNOSIS — R6520 Severe sepsis without septic shock: Secondary | ICD-10-CM

## 2020-05-14 LAB — COMPREHENSIVE METABOLIC PANEL
ALT: 21 U/L (ref 0–44)
AST: 30 U/L (ref 15–41)
Albumin: 2.8 g/dL — ABNORMAL LOW (ref 3.5–5.0)
Alkaline Phosphatase: 44 U/L (ref 38–126)
Anion gap: 6 (ref 5–15)
BUN: 23 mg/dL (ref 8–23)
CO2: 32 mmol/L (ref 22–32)
Calcium: 7.8 mg/dL — ABNORMAL LOW (ref 8.9–10.3)
Chloride: 109 mmol/L (ref 98–111)
Creatinine, Ser: 1.25 mg/dL — ABNORMAL HIGH (ref 0.61–1.24)
GFR, Estimated: >60 mL/min (ref >60)
Glucose, Bld: 175 mg/dL — ABNORMAL HIGH (ref 70–99)
Potassium: 3.1 mmol/L — ABNORMAL LOW (ref 3.5–5.1)
Sodium: 147 mmol/L — ABNORMAL HIGH (ref 135–145)
Total Bilirubin: 0.6 mg/dL (ref 0.3–1.2)
Total Protein: 6.9 g/dL (ref 6.5–8.1)

## 2020-05-14 LAB — CBC
HCT: 42.1 % (ref 39.0–52.0)
Hemoglobin: 12.3 g/dL — ABNORMAL LOW (ref 13.0–17.0)
MCH: 30 pg (ref 26.0–34.0)
MCHC: 29.2 g/dL — ABNORMAL LOW (ref 30.0–36.0)
MCV: 102.7 fL — ABNORMAL HIGH (ref 80.0–100.0)
Platelets: 191 10*3/uL (ref 150–400)
RBC: 4.1 MIL/uL — ABNORMAL LOW (ref 4.22–5.81)
RDW: 13.5 % (ref 11.5–15.5)
WBC: 9.9 10*3/uL (ref 4.0–10.5)
nRBC: 0 % (ref 0.0–0.2)

## 2020-05-14 LAB — CULTURE, BLOOD (ROUTINE X 2): Special Requests: ADEQUATE

## 2020-05-14 LAB — GLUCOSE, CAPILLARY
Glucose-Capillary: 136 mg/dL — ABNORMAL HIGH (ref 70–99)
Glucose-Capillary: 142 mg/dL — ABNORMAL HIGH (ref 70–99)
Glucose-Capillary: 143 mg/dL — ABNORMAL HIGH (ref 70–99)
Glucose-Capillary: 143 mg/dL — ABNORMAL HIGH (ref 70–99)
Glucose-Capillary: 155 mg/dL — ABNORMAL HIGH (ref 70–99)
Glucose-Capillary: 159 mg/dL — ABNORMAL HIGH (ref 70–99)

## 2020-05-14 MED ORDER — POTASSIUM CL IN DEXTROSE 5% 20 MEQ/L IV SOLN
20.0000 meq | INTRAVENOUS | Status: DC
  Administered 2020-05-14: 20 meq via INTRAVENOUS
  Filled 2020-05-14 (×2): qty 1000

## 2020-05-14 MED ORDER — POTASSIUM CL IN DEXTROSE 5% 20 MEQ/L IV SOLN
20.0000 meq | INTRAVENOUS | Status: DC
  Administered 2020-05-14 – 2020-05-19 (×5): 20 meq via INTRAVENOUS

## 2020-05-14 NOTE — Progress Notes (Signed)
GI Inpatient Follow-up Note   Subjective: unable to get any history from patient  Discussed w/ RN - continued to have good output in FMS overnight. Abd distention seems improved. No complaints.   Scheduled Inpatient Medications:  . Chlorhexidine Gluconate Cloth  6 each Topical Q0600  . enoxaparin (LOVENOX) injection  110 mg Subcutaneous Q12H  . insulin aspart  0-9 Units Subcutaneous Q4H  . ipratropium-albuterol  3 mL Nebulization BID  . mupirocin ointment  1 application Nasal BID    Continuous Inpatient Infusions:   . dextrose 100 mL/hr at 05/14/20 0155  . meropenem (MERREM) IV 1 g (05/14/20 0525)  . vancomycin 1,250 mg (05/13/20 2317)    PRN Inpatient Medications:  acetaminophen, ipratropium-albuterol, lactulose  Review of Systems: Not reviewed    Physical Examination: BP (!) 166/87 (BP Location: Right Arm)   Pulse 90   Temp 98 F (36.7 C) (Oral)   Resp 20   Ht 5\' 8"  (1.727 m)   Wt 111.6 kg   SpO2 94%   BMI 37.41 kg/m  Gen: appears comfortable, +dysarthria, difficulty getting answer to name or location. HEENT: PEERLA, EOMI, Neck: supple, no JVD or thyromegaly Chest: CTA bilaterally, no wheezes, crackles, or other adventitious sounds CV: RRR, no m/g/c/r Abd: soft but fullness, no tense distention, normal bowel sounds.  Ext: no edema, well perfused with 2+ pulses, Skin: no rash or lesions noted Lymph: no LAD  Data: Lab Results  Component Value Date   WBC 9.9 05/14/2020   HGB 12.3 (L) 05/14/2020   HCT 42.1 05/14/2020   MCV 102.7 (H) 05/14/2020   PLT 191 05/14/2020   Recent Labs  Lab 05/12/20 0456 05/13/20 0402 05/14/20 0525  HGB 11.5* 11.9* 12.3*   Lab Results  Component Value Date   NA 147 (H) 05/14/2020   K 3.1 (L) 05/14/2020   CL 109 05/14/2020   CO2 32 05/14/2020   BUN 23 05/14/2020   CREATININE 1.25 (H) 05/14/2020   GLU 76 09/12/2015   Lab Results  Component Value Date   ALT 21 05/14/2020   AST 30 05/14/2020   ALKPHOS 44 05/14/2020    BILITOT 0.6 05/14/2020   Recent Labs  Lab 05/11/20 0544  APTT 30  INR 1.3*     Abx xray 05/13/20 -  IMPRESSION: 1. Persistent marked gaseous distension of the sigmoid colon. 2. Moderate volume of stool throughout the colon, slightly improved from prior.   Assessment/Plan: Mr. Kreitzer is a 74 y.o. 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 Admitted 05/10/20 with severe sepsis, acute on chronic respiratory failure, and abdominal distension with CT findings of fecal impaction within distal sigmoid and subsequent distal large bowel obstruction.  1. 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 Will plan for repat xray tomorrow prior to d/c and will advance diet to full liquid.  2. Hypokalemia - mild, hospitalist to replace   3. Sepsis - WBC normal. VSS. Lactic acidosis resolved, on vanc/cefepime/flagyl.    Plan:  1. Begin full liquid dysphagia 1 diet per speech 2. Repeat abd xray in AM  3. Monitor for worsening abd pain or increased WBC  Case discussed w/ Dr 05/12/20  Please call  with questions or concerns.    Bluford Kaufmann, PA-C [facility]

## 2020-05-14 NOTE — Progress Notes (Signed)
PROGRESS NOTE    [name] [name]  WUJ:[id number] DOB: 1946/06/08 DOA: 05/10/2020 PCP: Sharee Holster, NP   Chief Complaint  Patient presents with  . 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 As per report: he was seen in the ED on late evening of 11/19 due to shortness of breath whereby he was requiring more supplemental oxygen and his oxygen was increased from 2 L/min to 3 L/min at the nursing facility, breathing treatment was also provided, but his O2 sats continued to be in the 80s, so he was taken to the ED for further evaluation.   In the ED, patient was suspected to have aspiration pneumonia due to his history of dysphagia and prior aspiration pneumonia, breathing treatment (albuterol and Atrovent) and small amount of white and occasionally yellow mucus were suctioned from the nares and throat, He was started on IV clindamycin.  CT scan done at that time does not reveal any specific abnormalities and patient had no leukocytosis, BNP was normal, respiratory panel was negative for influenza A, B and SARS coronavirus 2, so patient was discharged back to [facility] for a 7-day course of IV clindamycin. Blood culture drawn when patient came to the ED was positive for gram-positive cocci in one of the 2 cultures (possibly contaminated), but based on patient's clinical presentation, he was brought back to the emergency department. In the ED, he was febrile and tachycardic, LABS- leukocytosis, hypernatremia,, hyperglycemia, acute renal failure with BUN to  creatinine 41/1.65 (baseline creatinine at 1.0-1.2).  Lactic acidosis  2.6> 2.2.  UA unimpressive for UTI. CT abdomen and pelvis with contrast showed findings consistent with fecal impaction within the distal sigmoid colon with subsequent distal large bowel obstruction.  CT angiography of chest with contrast showed no definite evidence of acute PE, though still shows concern for PE.  CXR-persistent mild patchy bilateral opacities with possible superimposed acute infiltrate.  He was just tired and IV fluids, antibiotics and admitted for further management. Patient is followed by Dr. Lovell Sheehan from surgery. Being managed with supportive care and edema. With ongoing abdominal distention GI was consulted 11/22, recommended Flexi-Seal for decompression.  Subjective: Afebrile, no chest pain, no nausea vomiting.  Patient reports being thirsty and having bowel movements.  Assessment & Plan:  Severe sepsis POA likely multifactorial secondary to aspiration pneumonia also with large bowel obstruction: Vital signs stable, leukocytosis improving.  We will continue on empiric vancomycin/cefepime, blood cultures growing gram-positive cocci, culture in process.  Speech following currently n.p.o. due to GI issues and once GI issues stable, per SSLP okay to start PUREED and HTL diet.  Dilated colon possible obstruction versus ileus with constipation/history of chronic constipation ?OGILVIE SYNDROME: Appreciate surgery input, status post lactulose enema.  GI consulted due to ongoing decompression, advised Flexi-Seal for decompression and holding off on flex sigmoidoscopy decompression.  Continue on IV fluid hydration.  Avoid p.o. lactulose due to gaseous distension as outpatient.  Continue to have soft BM's; will advance diet.  Recent blood culture + in aerobic bottle only - staph in BCID 05/10/20 am- f/u culture, which appears to be contaminants.  Will discontinue vancomycin.  Acute on  chronic hypoxic respiratory  failure from combination of aspiration pneumonia/abdominal distention: At baseline 2 L, currently needing 3.5 L nasal cannula.  Continue to monitor respiratory status closely, continue pulmonary support and complete antibiotic therapy.    Hypernatremia:from free water deficit from his bowel obstruction and decreased oral intake.   -improving -will continue D5W infusion   Acute renal failure,baseline creatinine at 1.0-1.2: Multifactorial from sepsis/bowel obstruction.  Continue IV fluid hydration renal function improving.  Good urine output.    Recent Labs  Lab 05/11/20 0544 05/11/20 1340 05/12/20 0456 05/13/20 0402 05/14/20 0525  BUN 42* 40* 36* 27* 23  CREATININE 1.78* 1.60* 1.37* 1.29* 1.25*   Hyperkalemia at 5.3 on presentation -potassium is low currently; will provide supplementation.  Goal is for potassium of 4  Hypokalemia- will replete w. iv kcl given patient's bowel ischemia   History of recurrent DVT/PE on Eliquis at baseline.  Currently n.p.o. and so cont therapeutic Lovenox.  Hyperthyroidism on methimazole, resume once able to take p.o.  Hyperlipidemia/hypertension: Blood pressure is overall stable.  Oral meds on hold   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: Appreciate speech therapy evaluation and recommendations -Dysphagia 1 with honey thick liquids has been initiated.  Hyperglycemia due to diabetes mellitus: HbA1c is still 7.9.  Blood sugar is stable despite being on D5W.  On sliding scale insulin.  Recent Labs  Lab 05/14/20 0022 05/14/20 0507 05/14/20 0727 05/14/20 1235 05/14/20 1706  GLUCAP 136* 155* 143* 159* 142*   Morbid obesity with Body mass index is 37.41 kg/m.   -Portion control discussed with patient.  SWN:IOEVO w/ his brother 11/21-who states he is a Product manager.Prognosis remains guarded at this time patient is DNR.He is at risk of decompensation in the setting of severe sepsis, abdominal distention bowel  obstruction along with hypoxic respiratory failure and aspiration.Spoke to his brother and is updated previously.  Palliative care has been consulted and appreciate input.    Nutrition: Diet Order            DIET - DYS 1 Room service appropriate? Yes; Fluid consistency: Honey Thick; Fluid restriction: 2000 mL Fluid  Diet effective now                 DVT prophylaxis: SCDs Start: 05/11/20 0142 Code Status:   Code Status: DNR  Family Communication: No family at bedside.  Status is: Inpatient. Remains inpatient appropriate because: Ongoing management of abdominal distention renal failure respiratory failure, remains on IVF,unable to take p.o.  Dispo: The patient is from: SNF              Anticipated d/c is to: SNF              Anticipated d/c date is: > 3 days              Patient currently is not medically stable to d/c.   Consultants:GI  Procedures:see below for x-ray reports.  Culture/Microbiology    Component Value Date/Time   SDES LEFT ANTECUBITAL 05/10/2020 2230   SPECREQUEST  05/10/2020 2230    BOTTLES DRAWN AEROBIC AND ANAEROBIC Blood Culture results may not be optimal due to an inadequate volume of blood received in culture bottles   CULT  05/10/2020 2230    NO GROWTH 4 DAYS Performed at [facility], [address], [place], [place] [address]    REPTSTATUS PENDING 05/10/2020 2230    Other culture-see note  Medications: Scheduled Meds: . Chlorhexidine Gluconate Cloth  6 each Topical Q0600  .  enoxaparin (LOVENOX) injection  110 mg Subcutaneous Q12H  . insulin aspart  0-9 Units Subcutaneous Q4H  . ipratropium-albuterol  3 mL Nebulization BID  . mupirocin ointment  1 application Nasal BID   Continuous Infusions: . dextrose 5 % with KCl 20 mEq / L 20 mEq (05/14/20 1703)  . meropenem (MERREM) IV 1 g (05/14/20 1524)    Antimicrobials: Anti-infectives (From admission, onward)   Start     Dose/Rate Route Frequency Ordered Stop   05/13/20 0900  meropenem (MERREM)  1 g in sodium chloride 0.9 % 100 mL IVPB        1 g 200 mL/hr over 30 Minutes Intravenous Every 8 hours 05/13/20 0808     05/11/20 2200  vancomycin (VANCOREADY) IVPB 1250 mg/250 mL  Status:  Discontinued        1,250 mg 166.7 mL/hr over 90 Minutes Intravenous Every 24 hours 05/11/20 0316 05/14/20 1016   05/11/20 1000  ceFEPIme (MAXIPIME) 2 g in sodium chloride 0.9 % 100 mL IVPB  Status:  Discontinued        2 g 200 mL/hr over 30 Minutes Intravenous Every 12 hours 05/11/20 0316 05/13/20 0808   05/11/20 0300  metroNIDAZOLE (FLAGYL) IVPB 500 mg  Status:  Discontinued        500 mg 100 mL/hr over 60 Minutes Intravenous Every 8 hours 05/11/20 0214 05/13/20 0808   05/10/20 2215  aztreonam (AZACTAM) 2 g in sodium chloride 0.9 % 100 mL IVPB  Status:  Discontinued        2 g 200 mL/hr over 30 Minutes Intravenous  Once 05/10/20 2205 05/10/20 2211   05/10/20 2215  vancomycin (VANCOCIN) IVPB 1000 mg/200 mL premix  Status:  Discontinued        1,000 mg 200 mL/hr over 60 Minutes Intravenous  Once 05/10/20 2205 05/10/20 2207   05/10/20 2215  vancomycin (VANCOREADY) IVPB 2000 mg/400 mL        2,000 mg 200 mL/hr over 120 Minutes Intravenous  Once 05/10/20 2207 05/11/20 0050   05/10/20 2215  ceFEPIme (MAXIPIME) 2 g in sodium chloride 0.9 % 100 mL IVPB        2 g 200 mL/hr over 30 Minutes Intravenous  Once 05/10/20 2211 05/10/20 2328     Objective:  Today's Vitals   05/13/20 2042 05/14/20 0437 05/14/20 0514 05/14/20 0747  BP: (!) 176/98  (!) 166/87   Pulse:   90   Resp: 20  20   Temp: 98.9 F (37.2 C)  98 F (36.7 C)   TempSrc: Oral  Oral   SpO2: 100% 95% 96% 94%  Weight:      Height:      PainSc: 0-No pain       Intake/Output Summary (Last 24 hours) at 05/14/2020 1854 Last data filed at 05/14/2020 1700 Gross per 24 hour  Intake --  Output 1450 ml  Net -1450 ml   Filed Weights   05/10/20 2122  Weight: 111.6 kg   Weight change:   Intake/Output from previous day: 11/23 0701 - 11/24  0700 In: -  Out: 1950 [Urine:1950] Intake/Output this shift: Total I/O In: -  Out: 400 [Urine:400]  Examination: General exam: Alert, awake, oriented x 2; reporting to be thirsty; currently afebrile.  No nausea vomiting.  Denies abdominal pain Respiratory system: Positive rhonchi bilaterally; no wheezing, no using accessory muscle.  Good O2 sat. Cardiovascular system: RRR. No rubs or gallops; unable to properly assess JVD with body habitus. Gastrointestinal system: Abdomen  is nontender to palpation; distended on examination, positive bowel sounds appreciated.  No guarding or rebound.   Central nervous system: Alert and oriented. No new focal neurological deficits. Extremities: No cyanosis or clubbing. Skin: No rashes, no petechiae. Psychiatry: Mood & affect appropriate.    Data Reviewed: I have personally reviewed following labs and imaging studies CBC: Recent Labs  Lab 05/10/20 0121 05/10/20 0121 05/10/20 2141 05/11/20 0544 05/12/20 0456 05/13/20 0402 05/14/20 0525  WBC 9.6   < > 18.8* 27.2* 15.7* 12.3* 9.9  NEUTROABS 6.3  --  17.5*  --   --   --   --   HGB 12.4*   < > 12.4* 11.8* 11.5* 11.9* 12.3*  HCT 43.6   < > 44.8 42.8 41.2 40.2 42.1  MCV 106.3*   < > 110.1* 112.0* 107.9* 103.9* 102.7*  PLT 235   < > 229 211 197 203 191   < > = values in this interval not displayed.   Basic Metabolic Panel: Recent Labs  Lab 05/11/20 0544 05/11/20 1340 05/12/20 0456 05/13/20 0402 05/14/20 0525  NA 148* 148* 151* 150* 147*  K 5.3* 4.9 3.9 3.4* 3.1*  CL 103 105 109 108 109  CO2 36* 38* 34* 33* 32  GLUCOSE 173* 130* 114* 137* 175*  BUN 42* 40* 36* 27* 23  CREATININE 1.78* 1.60* 1.37* 1.29* 1.25*  CALCIUM 8.3* 8.1* 8.3* 8.1* 7.8*  MG 2.3  --   --   --   --   PHOS 4.7*  --   --   --   --    GFR: Estimated Creatinine Clearance: 62.8 mL/min (A) (by C-G formula based on SCr of 1.25 mg/dL (H)).   Liver Function Tests: Recent Labs  Lab 05/10/20 0121 05/11/20 0544  05/12/20 0456 05/13/20 0402 05/14/20 0525  AST 19 68* 30 30 30   ALT 18 55* 37 29 21  ALKPHOS 51 52 48 47 44  BILITOT 0.3 0.6 0.3 0.4 0.6  PROT 7.7 7.3 7.2 7.0 6.9  ALBUMIN 3.2* 3.0* 2.9* 2.8* 2.8*   Coagulation Profile: Recent Labs  Lab 05/10/20 2141 05/11/20 0544  INR 1.3* 1.3*   CBG: Recent Labs  Lab 05/14/20 0022 05/14/20 0507 05/14/20 0727 05/14/20 1235 05/14/20 1706  GLUCAP 136* 155* 143* 159* 142*   Sepsis Labs: Recent Labs  Lab 05/10/20 2141 05/10/20 2320 05/11/20 0544 05/11/20 0916  LATICACIDVEN 2.6* 2.2* 1.6 1.3    Recent Results (from the past 240 hour(s))  Resp Panel by RT-PCR (Flu A&B, Covid) Nasopharyngeal Swab     Status: None   Collection Time: 05/09/20 11:59 PM   Specimen: Nasopharyngeal Swab; Nasopharyngeal(NP) swabs in vial transport medium  Result Value Ref Range Status   SARS Coronavirus 2 by RT PCR NEGATIVE NEGATIVE Final    Comment: (NOTE) SARS-CoV-2 target nucleic acids are NOT DETECTED.  The SARS-CoV-2 RNA is generally detectable in upper respiratory specimens during the acute phase of infection. The lowest concentration of SARS-CoV-2 viral copies this assay can detect is 138 copies/mL. A negative result does not preclude SARS-Cov-2 infection and should not be used as the sole basis for treatment or other patient management decisions. A negative result may occur with  improper specimen collection/handling, submission of specimen other than nasopharyngeal swab, presence of viral mutation(s) within the areas targeted by this assay, and inadequate number of viral copies(<138 copies/mL). A negative result must be combined with clinical observations, patient history, and epidemiological information. The expected result is Negative.  Fact Sheet  for Patients:  BloggerCourse.com  Fact Sheet for Healthcare Providers:  SeriousBroker.it  This test is no t yet approved or cleared by the  [place] FDA and  has been authorized for detection and/or diagnosis of SARS-CoV-2 by FDA under an Emergency Use Authorization (EUA). This EUA will remain  in effect (meaning this test can be used) for the duration of the COVID-19 declaration under Section 564(b)(1) of the Act, 21 U.S.C.section 360bbb-3(b)(1), unless the authorization is terminated  or revoked sooner.       Influenza A by PCR NEGATIVE NEGATIVE Final   Influenza B by PCR NEGATIVE NEGATIVE Final    Comment: (NOTE) The Xpert Xpress SARS-CoV-2/FLU/RSV plus assay is intended as an aid in the diagnosis of influenza from Nasopharyngeal swab specimens and should not be used as a sole basis for treatment. Nasal washings and aspirates are unacceptable for Xpert Xpress SARS-CoV-2/FLU/RSV testing.  Fact Sheet for Patients: BloggerCourse.com  Fact Sheet for Healthcare Providers: SeriousBroker.it  This test is not yet approved or cleared by the [place] FDA and has been authorized for detection and/or diagnosis of SARS-CoV-2 by FDA under an Emergency Use Authorization (EUA). This EUA will remain in effect (meaning this test can be used) for the duration of the COVID-19 declaration under Section 564(b)(1) of the Act, 21 U.S.C. section 360bbb-3(b)(1), unless the authorization is terminated or revoked.  Performed at [facility], [address] [place], [place] [address]   Culture, blood (routine x 2)     Status: Abnormal   Collection Time: 05/10/20  1:21 AM   Specimen: BLOOD RIGHT HAND  Result Value Ref Range Status   Specimen Description   Final    BLOOD RIGHT HAND Performed at [facility], [address], [place], [place] [address]    Special Requests   Final    BOTTLES DRAWN AEROBIC AND ANAEROBIC Blood Culture adequate volume Performed at [facility], [address], [place], [place] [address]    Culture  Setup Time   Final    GRAM POSITIVE COCCI Gram  Stain Report Called to,Read Back By and Verified With: W[place],C @ 2014 ON 05/10/20 BY JUW ANAEROBIC BOTTLE ONLY GS DONE @ [facility] CRITICAL RESULT CALLED TO, READ BACK BY AND VERIFIED WITH: C. TURNER,RN [id number] 05/11/2020 T. TYSOR    Culture (A)  Final    STAPHYLOCOCCUS HOMINIS THE SIGNIFICANCE OF ISOLATING THIS ORGANISM FROM A SINGLE SET OF BLOOD CULTURES WHEN MULTIPLE SETS ARE DRAWN IS UNCERTAIN. PLEASE NOTIFY THE MICROBIOLOGY DEPARTMENT WITHIN ONE WEEK IF SPECIATION AND SENSITIVITIES ARE REQUIRED. Performed at [facility], [address], [place], [place] [address]    Report Status 05/13/2020 FINAL  Final  Blood Culture ID Panel (Reflexed)     Status: Abnormal   Collection Time: 05/10/20  1:21 AM  Result Value Ref Range Status   Enterococcus faecalis NOT DETECTED NOT DETECTED Final   Enterococcus Faecium NOT DETECTED NOT DETECTED Final   Listeria monocytogenes NOT DETECTED NOT DETECTED Final   Staphylococcus species DETECTED (A) NOT DETECTED Final    Comment: CRITICAL RESULT CALLED TO, READ BACK BY AND VERIFIED WITH: CLuiz Ochoa [id number] 05/11/2020 T. TYSOR    Staphylococcus aureus (BCID) NOT DETECTED NOT DETECTED Final   Staphylococcus epidermidis NOT DETECTED NOT DETECTED Final   Staphylococcus lugdunensis NOT DETECTED NOT DETECTED Final   Streptococcus species NOT DETECTED NOT DETECTED Final   Streptococcus agalactiae NOT DETECTED NOT DETECTED Final   Streptococcus pneumoniae NOT DETECTED NOT DETECTED Final  Streptococcus pyogenes NOT DETECTED NOT DETECTED Final   A.calcoaceticus-baumannii NOT DETECTED NOT DETECTED Final   Bacteroides fragilis NOT DETECTED NOT DETECTED Final   Enterobacterales NOT DETECTED NOT DETECTED Final   Enterobacter cloacae complex NOT DETECTED NOT DETECTED Final   Escherichia coli NOT DETECTED NOT DETECTED Final   Klebsiella aerogenes NOT DETECTED NOT DETECTED Final   Klebsiella oxytoca NOT DETECTED NOT DETECTED Final   Klebsiella pneumoniae NOT  DETECTED NOT DETECTED Final   Proteus species NOT DETECTED NOT DETECTED Final   Salmonella species NOT DETECTED NOT DETECTED Final   Serratia marcescens NOT DETECTED NOT DETECTED Final   Haemophilus influenzae NOT DETECTED NOT DETECTED Final   Neisseria meningitidis NOT DETECTED NOT DETECTED Final   Pseudomonas aeruginosa NOT DETECTED NOT DETECTED Final   Stenotrophomonas maltophilia NOT DETECTED NOT DETECTED Final   Candida albicans NOT DETECTED NOT DETECTED Final   Candida auris NOT DETECTED NOT DETECTED Final   Candida glabrata NOT DETECTED NOT DETECTED Final   Candida krusei NOT DETECTED NOT DETECTED Final   Candida parapsilosis NOT DETECTED NOT DETECTED Final   Candida tropicalis NOT DETECTED NOT DETECTED Final   Cryptococcus neoformans/gattii NOT DETECTED NOT DETECTED Final    Comment: Performed at [facility], [address] [place], [place] [address]  Culture, blood (routine x 2)     Status: Abnormal (Preliminary result)   Collection Time: 05/10/20  1:29 AM   Specimen: BLOOD LEFT HAND  Result Value Ref Range Status   Specimen Description   Final    BLOOD LEFT HAND Performed at [facility], [address], [place], [place] [address]    Special Requests   Final    BOTTLES DRAWN AEROBIC AND ANAEROBIC Blood Culture adequate volume Performed at [facility], [address] [place], [place] [address]    Culture  Setup Time   Final    GRAM POSITIVE COCCI Gram Stain Report Called to,Read Back By and Verified With: TURNER,C @ 2333 ON 05/10/20 BY JUW IN BOTH AEROBIC AND ANAEROBIC BOTTLES GS DONE @ [facility] CRITICAL VALUE NOTED.  VALUE IS CONSISTENT WITH PREVIOUSLY REPORTED AND CALLED VALUE.    Culture (A)  Final    STAPHYLOCOCCUS CAPITIS SUSCEPTIBILITIES TO FOLLOW Performed at [facility], [address] [place], [place] [address]    Report Status PENDING  Incomplete  Urine Culture     Status: Abnormal   Collection Time: 05/10/20  9:37 AM   Specimen: Urine,  Catheterized  Result Value Ref Range Status   Specimen Description   Final    URINE, CATHETERIZED Performed at [facility], [address], [place], [place] [address]    Special Requests   Final    NONE Performed at [facility], [address], [place], [place] [address]    Culture >=100,000 COLONIES/mL CITROBACTER KOSERI (A)  Final   Report Status 05/12/2020 FINAL  Final   Organism ID, Bacteria CITROBACTER KOSERI (A)  Final      Susceptibility   Citrobacter koseri - MIC*    CEFAZOLIN >=64 RESISTANT Resistant     CEFEPIME <=0.12 SENSITIVE Sensitive     CEFTRIAXONE >=64 RESISTANT Resistant     CIPROFLOXACIN >=4 RESISTANT Resistant     GENTAMICIN <=1 SENSITIVE Sensitive     IMIPENEM 0.5 SENSITIVE Sensitive     NITROFURANTOIN 64 INTERMEDIATE Intermediate     TRIMETH/SULFA <=20 SENSITIVE Sensitive     PIP/TAZO 16 SENSITIVE Sensitive     * >=100,000 COLONIES/mL CITROBACTER KOSERI  Culture, blood (Routine X 2) w Reflex to ID Panel     Status: Abnormal   Collection Time: 05/10/20  9:41 PM   Specimen: Right Antecubital; Blood  Result Value Ref Range Status   Specimen Description   Final    RIGHT ANTECUBITAL Performed at [facility], [address] [place], [place] [address]    Special Requests   Final    BOTTLES DRAWN AEROBIC AND ANAEROBIC Blood Culture adequate volume Performed at [facility], [address] [place], [place] [address]    Culture  Setup Time   Final    GRAM POSITIVE COCCI IN CLUSTERS RECOVERED FROM THE ANAEROBIC BOTTLE Gram Stain Report Called to,Read Back By and Verified With: WRIGHT,EMILY AT 1314 ON 05/12/2020 BY BAUGHAM,M. Performed at [facility] CRITICAL RESULT CALLED TO, READ BACK BY AND VERIFIED WITHCorliss Blacker: R AKONNOR RN [id number] 05/12/20 A BROWNING Performed at [facility], [address] [place], [place] [address]    Culture STAPHYLOCOCCUS CAPITIS (A)  Final   Report Status 05/14/2020 FINAL  Final   Organism ID, Bacteria STAPHYLOCOCCUS CAPITIS   Final      Susceptibility   Staphylococcus capitis - MIC*    CIPROFLOXACIN <=0.5 SENSITIVE Sensitive     ERYTHROMYCIN >=8 RESISTANT Resistant     GENTAMICIN <=0.5 SENSITIVE Sensitive     OXACILLIN SENSITIVE Sensitive     TETRACYCLINE >=16 RESISTANT Resistant     VANCOMYCIN <=0.5 SENSITIVE Sensitive     TRIMETH/SULFA <=10 SENSITIVE Sensitive     CLINDAMYCIN >=8 RESISTANT Resistant     RIFAMPIN <=0.5 SENSITIVE Sensitive     Inducible Clindamycin NEGATIVE Sensitive     * STAPHYLOCOCCUS CAPITIS  Blood Culture ID Panel (Reflexed)     Status: Abnormal   Collection Time: 05/10/20  9:41 PM  Result Value Ref Range Status   Enterococcus faecalis NOT DETECTED NOT DETECTED Final   Enterococcus Faecium NOT DETECTED NOT DETECTED Final   Listeria monocytogenes NOT DETECTED NOT DETECTED Final   Staphylococcus species DETECTED (A) NOT DETECTED Final    Comment: CRITICAL RESULT CALLED TO, READ BACK BY AND VERIFIED WITHCorliss Blacker: R AKONNOR RN [id number] 05/12/20 A BROWNING    Staphylococcus aureus (BCID) NOT DETECTED NOT DETECTED Final   Staphylococcus epidermidis NOT DETECTED NOT DETECTED Final   Staphylococcus lugdunensis NOT DETECTED NOT DETECTED Final   Streptococcus species NOT DETECTED NOT DETECTED Final   Streptococcus agalactiae NOT DETECTED NOT DETECTED Final   Streptococcus pneumoniae NOT DETECTED NOT DETECTED Final   Streptococcus pyogenes NOT DETECTED NOT DETECTED Final   A.calcoaceticus-baumannii NOT DETECTED NOT DETECTED Final   Bacteroides fragilis NOT DETECTED NOT DETECTED Final   Enterobacterales NOT DETECTED NOT DETECTED Final   Enterobacter cloacae complex NOT DETECTED NOT DETECTED Final   Escherichia coli NOT DETECTED NOT DETECTED Final   Klebsiella aerogenes NOT DETECTED NOT DETECTED Final   Klebsiella oxytoca NOT DETECTED NOT DETECTED Final   Klebsiella pneumoniae NOT DETECTED NOT DETECTED Final   Proteus species NOT DETECTED NOT DETECTED Final   Salmonella species NOT DETECTED NOT  DETECTED Final   Serratia marcescens NOT DETECTED NOT DETECTED Final   Haemophilus influenzae NOT DETECTED NOT DETECTED Final   Neisseria meningitidis NOT DETECTED NOT DETECTED Final   Pseudomonas aeruginosa NOT DETECTED NOT DETECTED Final   Stenotrophomonas maltophilia NOT DETECTED NOT DETECTED Final   Candida albicans NOT DETECTED NOT DETECTED Final   Candida auris NOT DETECTED NOT DETECTED Final   Candida glabrata NOT DETECTED NOT DETECTED Final  Candida krusei NOT DETECTED NOT DETECTED Final   Candida parapsilosis NOT DETECTED NOT DETECTED Final   Candida tropicalis NOT DETECTED NOT DETECTED Final   Cryptococcus neoformans/gattii NOT DETECTED NOT DETECTED Final    Comment: Performed at [facility], [address], [place], [place] [address]  Urine culture     Status: None   Collection Time: 05/10/20 10:02 PM   Specimen: In/Out Cath Urine  Result Value Ref Range Status   Specimen Description   Final    IN/OUT CATH URINE Performed at [facility], [address], [place], [place] [address]    Special Requests   Final    NONE Performed at [facility], [address], [place], [place] [address]    Culture   Final    NO GROWTH Performed at [facility], [address] [place], [place] [address]    Report Status 05/12/2020 FINAL  Final  Culture, blood (Routine X 2) w Reflex to ID Panel     Status: None (Preliminary result)   Collection Time: 05/10/20 10:30 PM   Specimen: Left Antecubital; Blood  Result Value Ref Range Status   Specimen Description LEFT ANTECUBITAL  Final   Special Requests   Final    BOTTLES DRAWN AEROBIC AND ANAEROBIC Blood Culture results may not be optimal due to an inadequate volume of blood received in culture bottles   Culture   Final    NO GROWTH 4 DAYS Performed at [facility], [address], [place], [place] [address]    Report Status PENDING  Incomplete  MRSA PCR Screening     Status: Abnormal   Collection Time: 05/11/20  2:14 AM    Specimen: Nasal Mucosa; Nasopharyngeal  Result Value Ref Range Status   MRSA by PCR POSITIVE (A) NEGATIVE Final    Comment:        The GeneXpert MRSA Assay (FDA approved for NASAL specimens only), is one component of a comprehensive MRSA colonization surveillance program. It is not intended to diagnose MRSA infection nor to guide or monitor treatment for MRSA infections. RESULT CALLED TO, READ BACK BY AND VERIFIED WITH: SINSKIE,P @ 0514 ON 05/11/20 BY JUW Performed at [facility], [address] [place], [place] [address]      Radiology Studies: DG Abd Portable 1V  Result Date: 05/13/2020 CLINICAL DATA:  Abdominal distension EXAM: PORTABLE ABDOMEN - 1 VIEW COMPARISON:  05/12/2020 FINDINGS: Persistent marked gaseous distension of the sigmoid colon. Moderate volume of stool projects throughout the colon, slightly improved from prior. No dilated loops of small bowel. No gross free intraperitoneal air on supine imaging. Degenerative changes of the spine. IMPRESSION: 1. Persistent marked gaseous distension of the sigmoid colon. 2. Moderate volume of stool throughout the colon, slightly improved from prior. Electronically Signed   By: Duanne Guess D.O.   On: 05/13/2020 08:24    LOS: 3 days    Time spent 35 minutes in the care of this patient  Vassie Loll, MD [facility]  05/14/2020, 6:54 PM

## 2020-05-14 NOTE — Care Management Important Message (Signed)
Important Message  Patient Details  Name: [name] [name] MRN: [id number] Date of Birth: 1946-04-24   Medicare Important Message Given:  Yes     Corey Harold 05/14/2020, 4:22 PM

## 2020-05-14 NOTE — Progress Notes (Signed)
SLP Cancellation Note  Patient Details Name: SHALIK SANFILIPPO MRN: [id number] DOB: [birth year]   Cancelled treatment:       Reason Eval/Treat Not Completed: Other (comment) (Pt still NPO for GI reasons); BSE completed on Monday with recommendation for D1/puree and HTL, however Pt is still NPO for GI reasons.   Thank you,  Havery Moros, CCC-SLP [phone]    Thirza Pellicano 05/14/2020, 1:24 PM

## 2020-05-14 NOTE — TOC Progression Note (Signed)
Transition of Care [facility]) - Progression Note    Patient Details  Name: [name] [name] MRN: [id number] Date of Birth: [birth year]  Transition of Care [facility]) CM/SW Contact  Karn Cassis, [place] Phone Number: 05/14/2020, 11:11 AM  Clinical Narrative: LCSW spoke with Lynnea Ferrier at [facility] to provide update on pt. Possible d/c tomorrow. Facility can accept on holiday. Pt will need repeat COVID test. MD aware.       Expected Discharge Plan: Skilled Nursing Facility (Patient is long-term resident of [facility]) Barriers to Discharge: Continued Medical Work up  Expected Discharge Plan and Services Expected Discharge Plan: Skilled Nursing Facility (Patient is long-term resident of [facility]) In-house Referral: Clinical Social Work Discharge Planning Services: NA Post Acute Care Choice: Skilled Nursing Facility (Patient is LTC resident of [facility]) Living arrangements for the past 2 months: Skilled Nursing Facility                 DME Arranged: N/A DME Agency: NA       HH Arranged: NA HH Agency: NA         Social Determinants of Health (SDOH) Interventions    Readmission Risk Interventions Readmission Risk Prevention Plan 05/12/2020 05/23/2019 05/10/2019  Transportation Screening Complete Complete Complete  PCP or Specialist Appt within 3-5 Days - Complete -  HRI or Home Care Consult - Not Complete Not Complete  HRI or Home Care Consult comments - Pt. LTC at [facility] Pt resides in LTC at [facility]  Social Work Consult for Recovery Care Planning/Counseling - Complete -  Palliative Care Screening - Complete -  Medication Review Oceanographer) Complete Complete Complete  PCP or Specialist appointment within 3-5 days of discharge Not Complete - -  PCP/Specialist Appt Not Complete comments PCP appointment will be set up by [facility] - -  HRI or Home Care Consult Complete - -  SW Recovery Care/Counseling Consult Complete - -  Palliative Care Screening Complete - -  Skilled Nursing Facility  Complete - -  Some recent data might be hidden

## 2020-05-14 NOTE — Progress Notes (Signed)
Blood glucose checked at 0513 was 155. 2 unit insulin given.

## 2020-05-14 NOTE — NC FL2 (Signed)
[place] MEDICAID FL2 LEVEL OF CARE SCREENING TOOL     IDENTIFICATION  Patient Name: [name] [name] Birthdate: [birth year] Sex: male Admission Date (Current Location): 05/10/2020  [place] and [place] Number:  Aaron Edelman [id number] Facility and Address:  [facility],  [address], [place] [address]      Provider Number: [id number]  Attending Physician Name and Address:  Vassie Loll, MD  Relative Name and Phone Number:  Riordan Walle (brother) Ph: [phone]    Current Level of Care: Hospital Recommended Level of Care: Skilled Nursing Facility (LTC resident of [facility]) Prior Approval Number:    Date Approved/Denied:   PASRR Number:    Discharge Plan: SNF [facility])    Current Diagnoses: Patient Active Problem List   Diagnosis Date Noted  . Severe sepsis (HCC) 05/11/2020  . Fecal impaction (HCC) 05/11/2020  . Leukocytosis 05/11/2020  . Hyperglycemia due to diabetes mellitus (HCC) 05/11/2020  . Lactic acidosis 05/11/2020  . Acute on chronic respiratory failure with hypercapnia (HCC) 05/11/2020  . Aortic atherosclerosis (HCC) 05/06/2020  . Essential hypertension 06/26/2019  . GERD (gastroesophageal reflux disease) 06/06/2019  . Palliative care by specialist   . Increased oropharyngeal secretions   . Goals of care, counseling/discussion   . Hypernatremia 05/09/2019  . Sepsis secondary to pneumonia 05/09/2019  . 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 AKI (acute kidney injury) (HCC) 05/09/2019  . Elevated PSA, between 10 and less than 20 ng/ml 02/08/2019  . Aspiration pneumonia (HCC) 01/30/2019  . 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 Dysphagia due to old cerebrovascular accident-on honey thickened liquids 08/25/2018  . Hyperlipidemia associated with type 2 diabetes mellitus (HCC) 08/25/2018  . Hyperthyroidism 04/05/2017  . 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 COPD (chronic obstructive pulmonary disease) (HCC) 10/19/2016  . 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 Long term current use of anticoagulant therapy-on Eliquis due to history of DVT/PE and strokes 10/21/2012  . DVT (deep venous thrombosis) (HCC) 09/26/2012  . Chronic pulmonary embolism (HCC) 09/26/2012  . Constipation 09/26/2012    Orientation RESPIRATION BLADDER Height & Weight     Self  O2 (3.5L) Incontinent Weight: 246 lb 0.5 oz (111.6 kg) Height:  5\' 8"  (172.7 cm)  BEHAVIORAL SYMPTOMS/MOOD NEUROLOGICAL BOWEL NUTRITION STATUS      Incontinent Diet (See d/c summary)  AMBULATORY STATUS COMMUNICATION OF NEEDS Skin   Total Care Verbally (Difficult to understand as a result of stroke) Skin abrasions                       Personal Care Assistance Level of Assistance  Bathing, Feeding, Dressing Bathing Assistance: Maximum assistance Feeding assistance: Maximum assistance Dressing Assistance: Maximum assistance     Functional Limitations Info  Speech, Hearing, Sight Sight Info: Adequate Hearing Info: Adequate Speech Info: Impaired (Impaired due to stroke)    SPECIAL CARE FACTORS FREQUENCY                       Contractures      Additional Factors Info  Code Status, Allergies, Insulin Sliding Scale Code Status Info: DNR Allergies Info: Penicillin G; Penicillins   Insulin Sliding Scale Info: See discharge summary       Current Medications (05/14/2020):  This is the current hospital active medication  list Current Facility-Administered Medications  Medication Dose Route Frequency Provider Last Rate Last Admin  . acetaminophen (TYLENOL) suppository 650 mg  650 mg Rectal Q6H PRN Adefeso, Oladapo, DO   650 mg at 05/13/20 1508  . Chlorhexidine Gluconate Cloth 2 % PADS 6 each  6 each Topical Q0600 Lanae Boast,  MD   6 each at 05/14/20 1052  . dextrose 5 % solution   Intravenous Continuous Lanae Boast, MD 100 mL/hr at 05/14/20 0155 New Bag at 05/14/20 0155  . enoxaparin (LOVENOX) injection 110 mg  110 mg Subcutaneous Q12H Stevphen Rochester, RPH   110 mg at 05/14/20 0904  . insulin aspart (novoLOG) injection 0-9 Units  0-9 Units Subcutaneous Q4H Adefeso, Oladapo, DO   1 Units at 05/14/20 0904  . ipratropium-albuterol (DUONEB) 0.5-2.5 (3) MG/3ML nebulizer solution 3 mL  3 mL Nebulization Q6H PRN Lanae Boast, MD   3 mL at 05/14/20 0437  . ipratropium-albuterol (DUONEB) 0.5-2.5 (3) MG/3ML nebulizer solution 3 mL  3 mL Nebulization BID Kc, Ramesh, MD   3 mL at 05/14/20 0747  . lactulose (CHRONULAC) enema 200 gm  300 mL Rectal Daily PRN Kc, Dayna Barker, MD      . meropenem (MERREM) 1 g in sodium chloride 0.9 % 100 mL IVPB  1 g Intravenous Q8H Kc, Ramesh, MD 200 mL/hr at 05/14/20 0525 1 g at 05/14/20 0525  . mupirocin ointment (BACTROBAN) 2 % 1 application  1 application Nasal BID Lanae Boast, MD   1 application at 05/14/20 9030     Discharge Medications: Please see discharge summary for a list of discharge medications.  Relevant Imaging Results:  Relevant Lab Results:   Additional Information SSN: [id number]  Karn Cassis, LCSW

## 2020-05-15 ENCOUNTER — Inpatient Hospital Stay (HOSPITAL_COMMUNITY): Payer: Medicare Other

## 2020-05-15 DIAGNOSIS — R140 Abdominal distension (gaseous): Secondary | ICD-10-CM | POA: Diagnosis not present

## 2020-05-15 DIAGNOSIS — A419 Sepsis, unspecified organism: Principal | ICD-10-CM

## 2020-05-15 DIAGNOSIS — K5939 Other megacolon: Secondary | ICD-10-CM

## 2020-05-15 DIAGNOSIS — R6520 Severe sepsis without septic shock: Secondary | ICD-10-CM

## 2020-05-15 LAB — CULTURE, BLOOD (ROUTINE X 2)
Culture: NO GROWTH
Special Requests: ADEQUATE

## 2020-05-15 LAB — CBC
HCT: 42.8 % (ref 39.0–52.0)
Hemoglobin: 12.3 g/dL — ABNORMAL LOW (ref 13.0–17.0)
MCH: 30 pg (ref 26.0–34.0)
MCHC: 28.7 g/dL — ABNORMAL LOW (ref 30.0–36.0)
MCV: 104.4 fL — ABNORMAL HIGH (ref 80.0–100.0)
Platelets: 180 10*3/uL (ref 150–400)
RBC: 4.1 MIL/uL — ABNORMAL LOW (ref 4.22–5.81)
RDW: 13.7 % (ref 11.5–15.5)
WBC: 10.5 10*3/uL (ref 4.0–10.5)
nRBC: 0 % (ref 0.0–0.2)

## 2020-05-15 LAB — BASIC METABOLIC PANEL
Anion gap: 7 (ref 5–15)
BUN: 22 mg/dL (ref 8–23)
CO2: 35 mmol/L — ABNORMAL HIGH (ref 22–32)
Calcium: 7.9 mg/dL — ABNORMAL LOW (ref 8.9–10.3)
Chloride: 105 mmol/L (ref 98–111)
Creatinine, Ser: 1.26 mg/dL — ABNORMAL HIGH (ref 0.61–1.24)
GFR, Estimated: 60 mL/min — ABNORMAL LOW (ref >60)
Glucose, Bld: 156 mg/dL — ABNORMAL HIGH (ref 70–99)
Potassium: 3.2 mmol/L — ABNORMAL LOW (ref 3.5–5.1)
Sodium: 147 mmol/L — ABNORMAL HIGH (ref 135–145)

## 2020-05-15 LAB — GLUCOSE, CAPILLARY
Glucose-Capillary: 133 mg/dL — ABNORMAL HIGH (ref 70–99)
Glucose-Capillary: 137 mg/dL — ABNORMAL HIGH (ref 70–99)
Glucose-Capillary: 144 mg/dL — ABNORMAL HIGH (ref 70–99)
Glucose-Capillary: 158 mg/dL — ABNORMAL HIGH (ref 70–99)
Glucose-Capillary: 169 mg/dL — ABNORMAL HIGH (ref 70–99)
Glucose-Capillary: 200 mg/dL — ABNORMAL HIGH (ref 70–99)

## 2020-05-15 MED ORDER — POTASSIUM CHLORIDE CRYS ER 20 MEQ PO TBCR
40.0000 meq | EXTENDED_RELEASE_TABLET | Freq: Once | ORAL | Status: AC
  Administered 2020-05-15: 40 meq via ORAL
  Filled 2020-05-15: qty 2

## 2020-05-15 MED ORDER — MAGNESIUM SULFATE IN D5W 1-5 GM/100ML-% IV SOLN
1.0000 g | Freq: Once | INTRAVENOUS | Status: AC
  Administered 2020-05-15: 1 g via INTRAVENOUS
  Filled 2020-05-15: qty 100

## 2020-05-15 NOTE — Progress Notes (Addendum)
[facility] Progress Note     Subjective: Abd remains distended. No pain complaints. Answered yes and no to questions. No tenderness on exam. Xray with continued sigmoid distention. Dr. Karilyn Cota  Attempted digital exam and foley catheter for decompression yesterday but without success. Enemas have been stopped. Electrolytes are still abnormal.   Objective: Vital signs in last 24 hours: Temp:  [97.7 F (36.5 C)-99.1 F (37.3 C)] 97.7 F (36.5 C) (11/25 0423) Pulse Rate:  [88] 88 (11/25 0423) Resp:  [19] 19 (11/25 0423) BP: (120-130)/(72-79) 120/72 (11/25 0423) SpO2:  [88 %-100 %] 88 % (11/25 0718) Last BM Date: 05/13/20  Intake/Output from previous day: 11/24 0701 - 11/25 0700 In: 632.4 [I.V.:632.4] Out: 750 [Urine:750] Intake/Output this shift: Total I/O In: 236 [P.O.:236] Out: 300 [Urine:300]  General appearance: alert and cooperative GI: distended, tympanic, nontender, no rebound or guarding  Lab Results:  Recent Labs    05/14/20 0525 05/15/20 0506  WBC 9.9 10.5  HGB 12.3* 12.3*  HCT 42.1 42.8  PLT 191 180   BMET Recent Labs    05/14/20 0525 05/15/20 0506  NA 147* 147*  K 3.1* 3.2*  CL 109 105  CO2 32 35*  GLUCOSE 175* 156*  BUN 23 22  CREATININE 1.25* 1.26*  CALCIUM 7.8* 7.9*   PT/INR No results for input(s): LABPROT, INR in the last 72 hours.  Studies/Results: DG Abd Portable 1V  Result Date: 05/15/2020 CLINICAL DATA:  Abdominal distension. EXAM: PORTABLE ABDOMEN - 1 VIEW COMPARISON:  Abdominal radiograph 05/13/2020 FINDINGS: Persistent marked gaseous distension of the sigmoid colon. Stool throughout the colon. No definite dilated loops of small bowel identified. Supine evaluation limited for the detection of free intraperitoneal air. Lumbar spine degenerative changes. IMPRESSION: Persistent marked gaseous distension of the sigmoid colon. Electronically Signed   By: Annia Belt M.D.   On: 05/15/2020 08:07     Anti-infectives: Anti-infectives (From admission, onward)   Start     Dose/Rate Route Frequency Ordered Stop   05/13/20 0900  meropenem (MERREM) 1 g in sodium chloride 0.9 % 100 mL IVPB        1 g 200 mL/hr over 30 Minutes Intravenous Every 8 hours 05/13/20 0808     05/11/20 2200  vancomycin (VANCOREADY) IVPB 1250 mg/250 mL  Status:  Discontinued        1,250 mg 166.7 mL/hr over 90 Minutes Intravenous Every 24 hours 05/11/20 0316 05/14/20 1016   05/11/20 1000  ceFEPIme (MAXIPIME) 2 g in sodium chloride 0.9 % 100 mL IVPB  Status:  Discontinued        2 g 200 mL/hr over 30 Minutes Intravenous Every 12 hours 05/11/20 0316 05/13/20 0808   05/11/20 0300  metroNIDAZOLE (FLAGYL) IVPB 500 mg  Status:  Discontinued        500 mg 100 mL/hr over 60 Minutes Intravenous Every 8 hours 05/11/20 0214 05/13/20 0808   05/10/20 2215  aztreonam (AZACTAM) 2 g in sodium chloride 0.9 % 100 mL IVPB  Status:  Discontinued        2 g 200 mL/hr over 30 Minutes Intravenous  Once 05/10/20 2205 05/10/20 2211   05/10/20 2215  vancomycin (VANCOCIN) IVPB 1000 mg/200 mL premix  Status:  Discontinued        1,000 mg 200 mL/hr over 60 Minutes Intravenous  Once 05/10/20 2205 05/10/20 2207   05/10/20 2215  vancomycin (VANCOREADY) IVPB 2000 mg/400 mL        2,000 mg 200 mL/hr over 120  Minutes Intravenous  Once 05/10/20 2207 05/11/20 0050   05/10/20 2215  ceFEPIme (MAXIPIME) 2 g in sodium chloride 0.9 % 100 mL IVPB        2 g 200 mL/hr over 30 Minutes Intravenous  Once 05/10/20 2211 05/10/20 2328      Assessment/Plan: Mr. Ohm is a 74 yo with continued distention and dilated sigmoid colon and history of volvulus, flexiseal and attempts at bedside decompression by Dr. Karilyn Cota with foley not successful. Rectal tube without much output and nothing recorded since 11/23. Enemas were stopped about that time?   May need this manual stimulation to help with the build of up colonic gas and enemas could help provide this?  Sent message to discuss with Dr. Gwenlyn Perking, Dr. Jena Gauss and RN.   Continue electrolyte replacement.   LOS: 4 days    Lucretia Roers 05/15/2020

## 2020-05-15 NOTE — Progress Notes (Signed)
Awake.  Not conversant.  Immobile appearing  Seen with nursing staff.  Vital signs in last 24 hours: Temp:  [97.7 F (36.5 C)-99.1 F (37.3 C)] 97.7 F (36.5 C) (11/25 0423) Pulse Rate:  [88] 88 (11/25 0423) Resp:  [19] 19 (11/25 0423) BP: (120-130)/(72-79) 120/72 (11/25 0423) SpO2:  [88 %-100 %] 88 % (11/25 0718) Last BM Date: 05/13/20 General:   Chronically ill, disheveled individual appears comfortable in hospital bed. Abdomen: Marked distention.  Bowel sounds present.  Marked increased tympany.    KUB today reveals distended distal colon primarily sigmoid.  No significant stool in the rectum  DRE by Dr. Karilyn Cota yesterday demonstrated no impaction.  Only a small amount of liquid stool in the Flexi-Seal bag over the past 24 hours (estimate about 20 cc)  Potassium remains low at 3.2    Intake/Output from previous day: 11/24 0701 - 11/25 0700 In: 632.4 [I.V.:632.4] Out: 750 [Urine:750] Intake/Output this shift: Total I/O In: 486 [P.O.:486] Out: 300 [Urine:300]  Lab Results: Recent Labs    05/13/20 0402 05/14/20 0525 05/15/20 0506  WBC 12.3* 9.9 10.5  HGB 11.9* 12.3* 12.3*  HCT 40.2 42.1 42.8  PLT 203 191 180   BMET Recent Labs    05/13/20 0402 05/14/20 0525 05/15/20 0506  NA 150* 147* 147*  K 3.4* 3.1* 3.2*  CL 108 109 105  CO2 33* 32 35*  GLUCOSE 137* 175* 156*  BUN 27* 23 22  CREATININE 1.29* 1.25* 1.26*  CALCIUM 8.1* 7.8* 7.9*   LFT Recent Labs    05/14/20 0525  PROT 6.9  ALBUMIN 2.8*  AST 30  ALT 21  ALKPHOS 44  BILITOT 0.6   PT/INR No results for input(s): LABPROT, INR in the last 72 hours. Hepatitis Panel No results for input(s): HEPBSAG, HCVAB, HEPAIGM, HEPBIGM in the last 72 hours. C-Diff No results for input(s): CDIFFTOX in the last 72 hours.  Studies/Results: DG Abd Portable 1V  Result Date: 05/15/2020 CLINICAL DATA:  Abdominal distension. EXAM: PORTABLE ABDOMEN - 1 VIEW COMPARISON:  Abdominal radiograph 05/13/2020 FINDINGS:  Persistent marked gaseous distension of the sigmoid colon. Stool throughout the colon. No definite dilated loops of small bowel identified. Supine evaluation limited for the detection of free intraperitoneal air. Lumbar spine degenerative changes. IMPRESSION: Persistent marked gaseous distension of the sigmoid colon. Electronically Signed   By: Annia Belt M.D.   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   Hyperthyroidism   Hyperlipidemia associated with type 2 diabetes mellitus (HCC)   Aspiration pneumonia (HCC)   Hypernatremia   Sepsis secondary to pneumonia   AKI (acute kidney injury) (HCC)   Goals of care, counseling/discussion   Palliative care by specialist   Essential hypertension   Fecal impaction (HCC)   Leukocytosis   Hyperglycemia due to diabetes mellitus (HCC)   Lactic acidosis   Acute on chronic respiratory failure with hypercapnia (HCC)   Abdominal distention   Dilatation of colon  Impression: 74 year old gentleman with history of CVA with devastating neurologic deficit, admitted to the hospital with acute critical illness (sepsis, aspiration pneumonia hypoxemic respiratory failure)  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  Clinically and radiographically he no longer has a fecal impaction.  Moreover, recent CT demonstrated no evidence of mechanical obstruction including  volvulus.  Potassium remains low.  Recommendations: For now, continue Flexi-Seal He is likely going to need further intervention. Would consider neostigmine versus a flexible sigmoidoscopy in the near future. It is important to normalize electrolytes to promote colonic motility-per attending.  Dr. Levon Hedger will see tomorrow.

## 2020-05-15 NOTE — Progress Notes (Signed)
PROGRESS NOTE    [name] [name]  PFX:[id number] DOB: 12-01-1945 DOA: 05/10/2020 PCP: Sharee Holster, NP   Chief Complaint  Patient presents with  . 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 As per report: he was seen in the ED on late evening of 11/19 due to shortness of breath whereby he was requiring more supplemental oxygen and his oxygen was increased from 2 L/min to 3 L/min at the nursing facility, breathing treatment was also provided, but his O2 sats continued to be in the 80s, so he was taken to the ED for further evaluation.   In the ED, patient was suspected to have aspiration pneumonia due to his history of dysphagia and prior aspiration pneumonia, breathing treatment (albuterol and Atrovent) and small amount of white and occasionally yellow mucus were suctioned from the nares and throat, He was started on IV clindamycin.  CT scan done at that time does not reveal any specific abnormalities and patient had no leukocytosis, BNP was normal, respiratory panel was negative for influenza A, B and SARS coronavirus 2, so patient was discharged back to [facility] for a 7-day course of IV clindamycin. Blood culture drawn when patient came to the ED was positive for gram-positive cocci in one of the 2 cultures (possibly contaminated), but based on patient's clinical presentation, he was brought back to the emergency department. In the ED, he was febrile and tachycardic, LABS- leukocytosis, hypernatremia,, hyperglycemia, acute renal failure with BUN to  creatinine 41/1.65 (baseline creatinine at 1.0-1.2).  Lactic acidosis  2.6> 2.2.  UA unimpressive for UTI. CT abdomen and pelvis with contrast showed findings consistent with fecal impaction within the distal sigmoid colon with subsequent distal large bowel obstruction.  CT angiography of chest with contrast showed no definite evidence of acute PE, though still shows concern for PE.  CXR-persistent mild patchy bilateral opacities with possible superimposed acute infiltrate.  He was just tired and IV fluids, antibiotics and admitted for further management. Patient is followed by Dr. Lovell Sheehan from surgery. Being managed with supportive care and edema. With ongoing abdominal distention GI was consulted 11/22, recommended Flexi-Seal for decompression.  Subjective: Afebrile, no chest pain, no nausea vomiting.  Patient reports being thirsty and having bowel movements.  Assessment & Plan:  Severe sepsis POA likely multifactorial secondary to aspiration pneumonia also with large bowel obstruction: Vital signs stable, leukocytosis improving.  We will continue on empiric vancomycin/cefepime, blood cultures growing gram-positive cocci, culture demonstrated to be contaminants.  per SSLP okay to start PUREED and HTL diet.  Dilated colon possible obstruction versus ileus with constipation/history of chronic constipation ?OGILVIE SYNDROME: Appreciate surgery input, status post lactulose enema.  GI consulted due to ongoing decompression, advised Flexi-Seal for decompression and holding off on flex sigmoidoscopy decompression.  Continue on IV fluid hydration.  Avoid p.o. lactulose due to gaseous distension as outpatient.  Continue to have soft BM's (but decrease amount today); will follow GI service and general surgery recommendations.  Continue to watch patient's diet tolerance.  Recent blood culture + in aerobic bottle only - staph in BCID 05/10/20 am- f/u culture, which appears to be contaminants.  Vancomycin  discontinued on 05/14/2020.  Acute on chronic hypoxic respiratory failure from combination of aspiration pneumonia/abdominal distention: At baseline 2 L, currently needing 3 L nasal cannula.  Continue to monitor respiratory status closely, continue pulmonary support and complete antibiotic therapy.    Hypernatremia: from free water deficit from his bowel obstruction and decreased oral intake.   -Continue improving -will continue D5W infusion (at adjusted rate). -Sodium down to 147 today.  Acute renal failure,baseline creatinine at 1.0-1.2: Multifactorial from sepsis/bowel obstruction.  Continue IV fluid hydration renal function improving.  Good urine output reported.  Creatinine 1.26 currently (very close to baseline).  Recent Labs  Lab 05/11/20 1340 05/12/20 0456 05/13/20 0402 05/14/20 0525 05/15/20 0506  BUN 40* 36* 27* 23 22  CREATININE 1.60* 1.37* 1.29* 1.25* 1.26*   Hyperkalemia at 5.3 on presentation/hypokalemia -potassium is still low currently; will continue providing repletion/supplementation as needed.  Goal is for potassium of 4  History of recurrent DVT/PE on Eliquis at baseline.  Currently n.p.o. and so cont therapeutic Lovenox.  Hyperthyroidism on methimazole, resume once able to take p.o.  Hyperlipidemia/hypertension: Blood pressure is overall stable.  Oral meds on hold   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: Appreciate speech therapy evaluation and recommendations -Dysphagia 1 with honey thick liquids has been initiated.  Hyperglycemia due to diabetes mellitus: HbA1c is still 7.9.  Blood sugar is stable despite being on D5W.  On sliding scale insulin.   Recent Labs  Lab 05/14/20 1235 05/14/20 1706 05/14/20 2011 05/15/20 0028 05/15/20 0418  GLUCAP 159* 142* 143* 137* 133*   Morbid obesity with Body mass index is 37.41 kg/m.   -Portion control discussed with patient.  RUE:AVWUJ w/ his brother 11/21-who states he is a  Product manager.Prognosis remains guarded at this time patient is DNR.He is at risk of decompensation in the setting of severe sepsis, abdominal distention bowel obstruction along with hypoxic respiratory failure and aspiration.Spoke to his brother and is updated previously.  Palliative care has been consulted and appreciate input.    Nutrition: Diet Order            DIET - DYS 1 Room service appropriate? Yes; Fluid consistency: Honey Thick; Fluid restriction: 2000 mL Fluid  Diet effective now                 DVT prophylaxis: SCDs Start: 05/11/20 0142 Code Status:   Code Status: DNR  Family Communication: No family at bedside.  Status is: Inpatient. Remains inpatient appropriate because: Ongoing management of abdominal distention renal failure respiratory failure, remains on IVF,unable to take p.o.  Dispo: The patient is from: SNF              Anticipated d/c is to: SNF              Anticipated d/c date is: > 3 days              Patient currently is not medically stable to d/c.   Consultants:GI  Procedures:see below for x-ray reports.  Culture/Microbiology    Component Value Date/Time   SDES LEFT ANTECUBITAL 05/10/2020 2230   SPECREQUEST  05/10/2020 2230    BOTTLES DRAWN AEROBIC AND ANAEROBIC Blood Culture results may not be optimal due to an inadequate volume of blood received in culture bottles   CULT  05/10/2020 2230    NO GROWTH 5 DAYS Performed at [facility], [address] [place], [place] [address]    REPTSTATUS 05/15/2020 FINAL 05/10/2020 2230    Other  culture-see note  Medications: Scheduled Meds: . Chlorhexidine Gluconate Cloth  6 each Topical Q0600  . enoxaparin (LOVENOX) injection  110 mg Subcutaneous Q12H  . insulin aspart  0-9 Units Subcutaneous Q4H  . ipratropium-albuterol  3 mL Nebulization BID  . mupirocin ointment  1 application Nasal BID  . potassium chloride  40 mEq Oral Once   Continuous Infusions: . dextrose 5 % with KCl 20 mEq / L 20 mEq (05/15/20  0541)  . magnesium sulfate bolus IVPB    . meropenem (MERREM) IV 1 g (05/15/20 0542)    Antimicrobials: Anti-infectives (From admission, onward)   Start     Dose/Rate Route Frequency Ordered Stop   05/13/20 0900  meropenem (MERREM) 1 g in sodium chloride 0.9 % 100 mL IVPB        1 g 200 mL/hr over 30 Minutes Intravenous Every 8 hours 05/13/20 0808     05/11/20 2200  vancomycin (VANCOREADY) IVPB 1250 mg/250 mL  Status:  Discontinued        1,250 mg 166.7 mL/hr over 90 Minutes Intravenous Every 24 hours 05/11/20 0316 05/14/20 1016   05/11/20 1000  ceFEPIme (MAXIPIME) 2 g in sodium chloride 0.9 % 100 mL IVPB  Status:  Discontinued        2 g 200 mL/hr over 30 Minutes Intravenous Every 12 hours 05/11/20 0316 05/13/20 0808   05/11/20 0300  metroNIDAZOLE (FLAGYL) IVPB 500 mg  Status:  Discontinued        500 mg 100 mL/hr over 60 Minutes Intravenous Every 8 hours 05/11/20 0214 05/13/20 0808   05/10/20 2215  aztreonam (AZACTAM) 2 g in sodium chloride 0.9 % 100 mL IVPB  Status:  Discontinued        2 g 200 mL/hr over 30 Minutes Intravenous  Once 05/10/20 2205 05/10/20 2211   05/10/20 2215  vancomycin (VANCOCIN) IVPB 1000 mg/200 mL premix  Status:  Discontinued        1,000 mg 200 mL/hr over 60 Minutes Intravenous  Once 05/10/20 2205 05/10/20 2207   05/10/20 2215  vancomycin (VANCOREADY) IVPB 2000 mg/400 mL        2,000 mg 200 mL/hr over 120 Minutes Intravenous  Once 05/10/20 2207 05/11/20 0050   05/10/20 2215  ceFEPIme (MAXIPIME) 2 g in sodium chloride 0.9 % 100 mL IVPB        2 g 200 mL/hr over 30 Minutes Intravenous  Once 05/10/20 2211 05/10/20 2328     Objective:  Today's Vitals   05/15/20 0114 05/15/20 0423 05/15/20 0718 05/15/20 1000  BP:  120/72    Pulse:  88    Resp:  19    Temp:  97.7 F (36.5 C)    TempSrc:      SpO2:  92% (!) 88%   Weight:      Height:      PainSc: 0-No pain   0-No pain    Intake/Output Summary (Last 24 hours) at 05/15/2020 1234 Last data filed at  05/15/2020 0918 Gross per 24 hour  Intake 868.37 ml  Output 1050 ml  Net -181.63 ml   Filed Weights   05/10/20 2122  Weight: 111.6 kg   Weight change:   Intake/Output from previous day: 11/24 0701 - 11/25 0700 In: 632.4 [I.V.:632.4] Out: 750 [Urine:750] Intake/Output this shift: Total I/O In: 236 [P.O.:236] Out: 300 [Urine:300]  Examination: General exam: Alert, awake, oriented x 2; afebrile and in no acute distress.  Reports to tolerate dysphagia 1 honey thick liquid  diet without problems.  Continue to have distended abdomen brace is slowly passing stools.  No nausea, no vomiting, no chest pain, no abdominal pain. Respiratory system: Positive scattered rhonchi; no wheezing, no using accessory muscle.  Using 2 L nasal cannula supplementation. Cardiovascular system:RRR. No murmurs, rubs, gallops.  Unable to properly assess JVD due to body habitus. Gastrointestinal system: Abdomen is still distended, with positive bowel sounds, nontender on palpation.   Central nervous system: Alert and oriented. No new focal neurological deficits. Extremities: No cyanosis or clubbing. Skin: No petechiae. Psychiatry: Mood & affect appropriate.     Data Reviewed: I have personally reviewed following labs and imaging studies.  CBC: Recent Labs  Lab 05/10/20 0121 05/10/20 0121 05/10/20 2141 05/10/20 2141 05/11/20 0544 05/12/20 0456 05/13/20 0402 05/14/20 0525 05/15/20 0506  WBC 9.6   < > 18.8*   < > 27.2* 15.7* 12.3* 9.9 10.5  NEUTROABS 6.3  --  17.5*  --   --   --   --   --   --   HGB 12.4*   < > 12.4*   < > 11.8* 11.5* 11.9* 12.3* 12.3*  HCT 43.6   < > 44.8   < > 42.8 41.2 40.2 42.1 42.8  MCV 106.3*   < > 110.1*   < > 112.0* 107.9* 103.9* 102.7* 104.4*  PLT 235   < > 229   < > 211 197 203 191 180   < > = values in this interval not displayed.   Basic Metabolic Panel: Recent Labs  Lab 05/11/20 0544 05/11/20 0544 05/11/20 1340 05/12/20 0456 05/13/20 0402 05/14/20 0525  05/15/20 0506  NA 148*   < > 148* 151* 150* 147* 147*  K 5.3*   < > 4.9 3.9 3.4* 3.1* 3.2*  CL 103   < > 105 109 108 109 105  CO2 36*   < > 38* 34* 33* 32 35*  GLUCOSE 173*   < > 130* 114* 137* 175* 156*  BUN 42*   < > 40* 36* 27* 23 22  CREATININE 1.78*   < > 1.60* 1.37* 1.29* 1.25* 1.26*  CALCIUM 8.3*   < > 8.1* 8.3* 8.1* 7.8* 7.9*  MG 2.3  --   --   --   --   --   --   PHOS 4.7*  --   --   --   --   --   --    < > = values in this interval not displayed.   GFR: Estimated Creatinine Clearance: 62.3 mL/min (A) (by C-G formula based on SCr of 1.26 mg/dL (H)).   Liver Function Tests: Recent Labs  Lab 05/10/20 0121 05/11/20 0544 05/12/20 0456 05/13/20 0402 05/14/20 0525  AST 19 68* 30 30 30   ALT 18 55* 37 29 21  ALKPHOS 51 52 48 47 44  BILITOT 0.3 0.6 0.3 0.4 0.6  PROT 7.7 7.3 7.2 7.0 6.9  ALBUMIN 3.2* 3.0* 2.9* 2.8* 2.8*   Coagulation Profile: Recent Labs  Lab 05/10/20 2141 05/11/20 0544  INR 1.3* 1.3*   CBG: Recent Labs  Lab 05/14/20 1235 05/14/20 1706 05/14/20 2011 05/15/20 0028 05/15/20 0418  GLUCAP 159* 142* 143* 137* 133*   Sepsis Labs: Recent Labs  Lab 05/10/20 2141 05/10/20 2320 05/11/20 0544 05/11/20 0916  LATICACIDVEN 2.6* 2.2* 1.6 1.3    Recent Results (from the past 240 hour(s))  Resp Panel by RT-PCR (Flu A&B, Covid) Nasopharyngeal Swab     Status: None  Collection Time: 05/09/20 11:59 PM   Specimen: Nasopharyngeal Swab; Nasopharyngeal(NP) swabs in vial transport medium  Result Value Ref Range Status   SARS Coronavirus 2 by RT PCR NEGATIVE NEGATIVE Final    Comment: (NOTE) SARS-CoV-2 target nucleic acids are NOT DETECTED.  The SARS-CoV-2 RNA is generally detectable in upper respiratory specimens during the acute phase of infection. The lowest concentration of SARS-CoV-2 viral copies this assay can detect is 138 copies/mL. A negative result does not preclude SARS-Cov-2 infection and should not be used as the sole basis for treatment  or other patient management decisions. A negative result may occur with  improper specimen collection/handling, submission of specimen other than nasopharyngeal swab, presence of viral mutation(s) within the areas targeted by this assay, and inadequate number of viral copies(<138 copies/mL). A negative result must be combined with clinical observations, patient history, and epidemiological information. The expected result is Negative.  Fact Sheet for Patients:  BloggerCourse.com  Fact Sheet for Healthcare Providers:  SeriousBroker.it  This test is no t yet approved or cleared by the [place] FDA and  has been authorized for detection and/or diagnosis of SARS-CoV-2 by FDA under an Emergency Use Authorization (EUA). This EUA will remain  in effect (meaning this test can be used) for the duration of the COVID-19 declaration under Section 564(b)(1) of the Act, 21 U.S.C.section 360bbb-3(b)(1), unless the authorization is terminated  or revoked sooner.       Influenza A by PCR NEGATIVE NEGATIVE Final   Influenza B by PCR NEGATIVE NEGATIVE Final    Comment: (NOTE) The Xpert Xpress SARS-CoV-2/FLU/RSV plus assay is intended as an aid in the diagnosis of influenza from Nasopharyngeal swab specimens and should not be used as a sole basis for treatment. Nasal washings and aspirates are unacceptable for Xpert Xpress SARS-CoV-2/FLU/RSV testing.  Fact Sheet for Patients: BloggerCourse.com  Fact Sheet for Healthcare Providers: SeriousBroker.it  This test is not yet approved or cleared by the [place] FDA and has been authorized for detection and/or diagnosis of SARS-CoV-2 by FDA under an Emergency Use Authorization (EUA). This EUA will remain in effect (meaning this test can be used) for the duration of the COVID-19 declaration under Section 564(b)(1) of the Act, 21 U.S.C. section  360bbb-3(b)(1), unless the authorization is terminated or revoked.  Performed at [facility], [address] [place], [place] [address]   Culture, blood (routine x 2)     Status: Abnormal   Collection Time: 05/10/20  1:21 AM   Specimen: BLOOD RIGHT HAND  Result Value Ref Range Status   Specimen Description   Final    BLOOD RIGHT HAND Performed at [facility], [address], [place], [place] [address]    Special Requests   Final    BOTTLES DRAWN AEROBIC AND ANAEROBIC Blood Culture adequate volume Performed at [facility], [address], [place], [place] [address]    Culture  Setup Time   Final    GRAM POSITIVE COCCI Gram Stain Report Called to,Read Back By and Verified With: WATLINGTON,C @ 2014 ON 05/10/20 BY JUW ANAEROBIC BOTTLE ONLY GS DONE @ [facility] CRITICAL RESULT CALLED TO, READ BACK BY AND VERIFIED WITH: C. TURNER,RN 0108 05/11/2020 T. TYSOR    Culture (A)  Final    STAPHYLOCOCCUS HOMINIS THE SIGNIFICANCE OF ISOLATING THIS ORGANISM FROM A SINGLE SET OF BLOOD CULTURES WHEN MULTIPLE SETS ARE DRAWN IS UNCERTAIN. PLEASE NOTIFY THE MICROBIOLOGY DEPARTMENT WITHIN ONE WEEK IF SPECIATION AND SENSITIVITIES ARE REQUIRED. Performed at [facility], [address], [place], [place] [address]    Report Status 05/13/2020 FINAL  Final  Blood Culture ID Panel (Reflexed)     Status: Abnormal   Collection Time: 05/10/20  1:21 AM  Result Value Ref Range Status   Enterococcus faecalis NOT DETECTED NOT DETECTED Final   Enterococcus Faecium NOT DETECTED NOT DETECTED Final   Listeria monocytogenes NOT DETECTED NOT DETECTED Final   Staphylococcus species DETECTED (A) NOT DETECTED Final    Comment: CRITICAL RESULT CALLED TO, READ BACK BY AND VERIFIED WITH: CLuiz Ochoa 1914 05/11/2020 T. TYSOR    Staphylococcus aureus (BCID) NOT DETECTED NOT DETECTED Final   Staphylococcus epidermidis NOT DETECTED NOT DETECTED Final   Staphylococcus lugdunensis NOT DETECTED NOT DETECTED Final    Streptococcus species NOT DETECTED NOT DETECTED Final   Streptococcus agalactiae NOT DETECTED NOT DETECTED Final   Streptococcus pneumoniae NOT DETECTED NOT DETECTED Final   Streptococcus pyogenes NOT DETECTED NOT DETECTED Final   A.calcoaceticus-baumannii NOT DETECTED NOT DETECTED Final   Bacteroides fragilis NOT DETECTED NOT DETECTED Final   Enterobacterales NOT DETECTED NOT DETECTED Final   Enterobacter cloacae complex NOT DETECTED NOT DETECTED Final   Escherichia coli NOT DETECTED NOT DETECTED Final   Klebsiella aerogenes NOT DETECTED NOT DETECTED Final   Klebsiella oxytoca NOT DETECTED NOT DETECTED Final   Klebsiella pneumoniae NOT DETECTED NOT DETECTED Final   Proteus species NOT DETECTED NOT DETECTED Final   Salmonella species NOT DETECTED NOT DETECTED Final   Serratia marcescens NOT DETECTED NOT DETECTED Final   Haemophilus influenzae NOT DETECTED NOT DETECTED Final   Neisseria meningitidis NOT DETECTED NOT DETECTED Final   Pseudomonas aeruginosa NOT DETECTED NOT DETECTED Final   Stenotrophomonas maltophilia NOT DETECTED NOT DETECTED Final   Candida albicans NOT DETECTED NOT DETECTED Final   Candida auris NOT DETECTED NOT DETECTED Final   Candida glabrata NOT DETECTED NOT DETECTED Final   Candida krusei NOT DETECTED NOT DETECTED Final   Candida parapsilosis NOT DETECTED NOT DETECTED Final   Candida tropicalis NOT DETECTED NOT DETECTED Final   Cryptococcus neoformans/gattii NOT DETECTED NOT DETECTED Final    Comment: Performed at [facility], [address], [place], [place] [address]  Culture, blood (routine x 2)     Status: Abnormal   Collection Time: 05/10/20  1:29 AM   Specimen: BLOOD LEFT HAND  Result Value Ref Range Status   Specimen Description   Final    BLOOD LEFT HAND Performed at [facility], [address], [place], [place] [address]    Special Requests   Final    BOTTLES DRAWN AEROBIC AND ANAEROBIC Blood Culture adequate volume Performed at [facility], [address] [place], [place] [address]    Culture  Setup Time   Final    GRAM POSITIVE COCCI Gram Stain Report Called to,Read Back By and Verified With: TURNER,C @ 2333 ON 05/10/20 BY JUW IN BOTH AEROBIC AND ANAEROBIC BOTTLES GS DONE @ [facility] CRITICAL VALUE NOTED.  VALUE IS CONSISTENT WITH PREVIOUSLY REPORTED AND CALLED VALUE. Performed at [facility], [address] [place], [place] [address]    Culture STAPHYLOCOCCUS CAPITIS (A)  Final   Report Status 05/15/2020 FINAL  Final   Organism ID, Bacteria STAPHYLOCOCCUS CAPITIS  Final      Susceptibility   Staphylococcus capitis - MIC*    CIPROFLOXACIN 4 RESISTANT Resistant     ERYTHROMYCIN >=8 RESISTANT Resistant     GENTAMICIN <=0.5 SENSITIVE Sensitive  OXACILLIN <=0.25 SENSITIVE Sensitive     TETRACYCLINE >=16 RESISTANT Resistant     VANCOMYCIN <=0.5 SENSITIVE Sensitive     TRIMETH/SULFA <=10 SENSITIVE Sensitive     CLINDAMYCIN >=8 RESISTANT Resistant     RIFAMPIN <=0.5 SENSITIVE Sensitive     Inducible Clindamycin NEGATIVE Sensitive     * STAPHYLOCOCCUS CAPITIS  Urine Culture     Status: Abnormal   Collection Time: 05/10/20  9:37 AM   Specimen: Urine, Catheterized  Result Value Ref Range Status   Specimen Description   Final    URINE, CATHETERIZED Performed at [facility], [address] [place], [place] [address]    Special Requests   Final    NONE Performed at [facility], [address] [place], [place] [address]    Culture >=100,000 COLONIES/mL CITROBACTER KOSERI (A)  Final   Report Status 05/12/2020 FINAL  Final   Organism ID, Bacteria CITROBACTER KOSERI (A)  Final      Susceptibility   Citrobacter koseri - MIC*    CEFAZOLIN >=64 RESISTANT Resistant     CEFEPIME <=0.12 SENSITIVE Sensitive     CEFTRIAXONE >=64 RESISTANT Resistant     CIPROFLOXACIN >=4 RESISTANT Resistant     GENTAMICIN <=1 SENSITIVE Sensitive     IMIPENEM 0.5 SENSITIVE Sensitive     NITROFURANTOIN 64 INTERMEDIATE Intermediate      TRIMETH/SULFA <=20 SENSITIVE Sensitive     PIP/TAZO 16 SENSITIVE Sensitive     * >=100,000 COLONIES/mL CITROBACTER KOSERI  Culture, blood (Routine X 2) w Reflex to ID Panel     Status: Abnormal   Collection Time: 05/10/20  9:41 PM   Specimen: Right Antecubital; Blood  Result Value Ref Range Status   Specimen Description   Final    RIGHT ANTECUBITAL Performed at [facility], [address] [place], [place] [address]    Special Requests   Final    BOTTLES DRAWN AEROBIC AND ANAEROBIC Blood Culture adequate volume Performed at [facility], [address] [place], [place] [address]    Culture  Setup Time   Final    GRAM POSITIVE COCCI IN CLUSTERS RECOVERED FROM THE ANAEROBIC BOTTLE Gram Stain Report Called to,Read Back By and Verified With: WRIGHT,EMILY AT 1314 ON 05/12/2020 BY BAUGHAM,M. Performed at [facility] CRITICAL RESULT CALLED TO, READ BACK BY AND VERIFIED WITHCorliss Blacker: R AKONNOR RN [id number] 05/12/20 A BROWNING Performed at [facility], [address] [place], [place] [address]    Culture STAPHYLOCOCCUS CAPITIS (A)  Final   Report Status 05/14/2020 FINAL  Final   Organism ID, Bacteria STAPHYLOCOCCUS CAPITIS  Final      Susceptibility   Staphylococcus capitis - MIC*    CIPROFLOXACIN <=0.5 SENSITIVE Sensitive     ERYTHROMYCIN >=8 RESISTANT Resistant     GENTAMICIN <=0.5 SENSITIVE Sensitive     OXACILLIN SENSITIVE Sensitive     TETRACYCLINE >=16 RESISTANT Resistant     VANCOMYCIN <=0.5 SENSITIVE Sensitive     TRIMETH/SULFA <=10 SENSITIVE Sensitive     CLINDAMYCIN >=8 RESISTANT Resistant     RIFAMPIN <=0.5 SENSITIVE Sensitive     Inducible Clindamycin NEGATIVE Sensitive     * STAPHYLOCOCCUS CAPITIS  Blood Culture ID Panel (Reflexed)     Status: Abnormal   Collection Time: 05/10/20  9:41 PM  Result Value Ref Range Status   Enterococcus faecalis NOT DETECTED NOT DETECTED Final   Enterococcus Faecium NOT DETECTED NOT DETECTED Final   Listeria monocytogenes NOT DETECTED  NOT DETECTED Final  Staphylococcus species DETECTED (A) NOT DETECTED Final    Comment: CRITICAL RESULT CALLED TO, READ BACK BY AND VERIFIED WITHCorliss Blacker RN 1610 05/12/20 A BROWNING    Staphylococcus aureus (BCID) NOT DETECTED NOT DETECTED Final   Staphylococcus epidermidis NOT DETECTED NOT DETECTED Final   Staphylococcus lugdunensis NOT DETECTED NOT DETECTED Final   Streptococcus species NOT DETECTED NOT DETECTED Final   Streptococcus agalactiae NOT DETECTED NOT DETECTED Final   Streptococcus pneumoniae NOT DETECTED NOT DETECTED Final   Streptococcus pyogenes NOT DETECTED NOT DETECTED Final   A.calcoaceticus-baumannii NOT DETECTED NOT DETECTED Final   Bacteroides fragilis NOT DETECTED NOT DETECTED Final   Enterobacterales NOT DETECTED NOT DETECTED Final   Enterobacter cloacae complex NOT DETECTED NOT DETECTED Final   Escherichia coli NOT DETECTED NOT DETECTED Final   Klebsiella aerogenes NOT DETECTED NOT DETECTED Final   Klebsiella oxytoca NOT DETECTED NOT DETECTED Final   Klebsiella pneumoniae NOT DETECTED NOT DETECTED Final   Proteus species NOT DETECTED NOT DETECTED Final   Salmonella species NOT DETECTED NOT DETECTED Final   Serratia marcescens NOT DETECTED NOT DETECTED Final   Haemophilus influenzae NOT DETECTED NOT DETECTED Final   Neisseria meningitidis NOT DETECTED NOT DETECTED Final   Pseudomonas aeruginosa NOT DETECTED NOT DETECTED Final   Stenotrophomonas maltophilia NOT DETECTED NOT DETECTED Final   Candida albicans NOT DETECTED NOT DETECTED Final   Candida auris NOT DETECTED NOT DETECTED Final   Candida glabrata NOT DETECTED NOT DETECTED Final   Candida krusei NOT DETECTED NOT DETECTED Final   Candida parapsilosis NOT DETECTED NOT DETECTED Final   Candida tropicalis NOT DETECTED NOT DETECTED Final   Cryptococcus neoformans/gattii NOT DETECTED NOT DETECTED Final    Comment: Performed at [facility], [address] [place], [place] [address]  Urine culture      Status: None   Collection Time: 05/10/20 10:02 PM   Specimen: In/Out Cath Urine  Result Value Ref Range Status   Specimen Description   Final    IN/OUT CATH URINE Performed at [facility], [address] [place], [place] [address]    Special Requests   Final    NONE Performed at [facility], [address] [place], [place] [address]    Culture   Final    NO GROWTH Performed at [facility], [address] [place], [place] [address]    Report Status 05/12/2020 FINAL  Final  Culture, blood (Routine X 2) w Reflex to ID Panel     Status: None   Collection Time: 05/10/20 10:30 PM   Specimen: Left Antecubital; Blood  Result Value Ref Range Status   Specimen Description LEFT ANTECUBITAL  Final   Special Requests   Final    BOTTLES DRAWN AEROBIC AND ANAEROBIC Blood Culture results may not be optimal due to an inadequate volume of blood received in culture bottles   Culture   Final    NO GROWTH 5 DAYS Performed at [facility], [address], [place], [place] [address]    Report Status 05/15/2020 FINAL  Final  MRSA PCR Screening     Status: Abnormal   Collection Time: 05/11/20  2:14 AM   Specimen: Nasal Mucosa; Nasopharyngeal  Result Value Ref Range Status   MRSA by PCR POSITIVE (A) NEGATIVE Final    Comment:        The GeneXpert MRSA Assay (FDA approved for NASAL specimens only), is one component of a comprehensive MRSA colonization surveillance program. It is not  intended to diagnose MRSA infection nor to guide or monitor treatment for MRSA infections. RESULT CALLED TO, READ BACK BY AND VERIFIED WITH: SINSKIE,P @ 0514 ON 05/11/20 BY JUW Performed at [facility], [address], [place], [place] [address]      Radiology Studies: DG Abd Portable 1V  Result Date: 05/15/2020 CLINICAL DATA:  Abdominal distension. EXAM: PORTABLE ABDOMEN - 1 VIEW COMPARISON:  Abdominal radiograph 05/13/2020 FINDINGS: Persistent marked gaseous distension of the sigmoid colon. Stool  throughout the colon. No definite dilated loops of small bowel identified. Supine evaluation limited for the detection of free intraperitoneal air. Lumbar spine degenerative changes. IMPRESSION: Persistent marked gaseous distension of the sigmoid colon. Electronically Signed   By: Annia Belt M.D.   On: 05/15/2020 08:07    LOS: 4 days    Time spent 30 minutes in the care of this patient  Vassie Loll, MD [facility]  05/15/2020, 12:34 PM

## 2020-05-15 NOTE — Progress Notes (Signed)
  Speech Language Pathology Treatment: Dysphagia  Patient Details Name: [name] [name] MRN: [id number] DOB: [birth year] Today's Date: 05/15/2020 Time: [id number] SLP Time Calculation (min) (ACUTE ONLY): 17 min  Assessment / Plan / Recommendation Clinical Impression  Pt seen for ongoing dysphagia intervention. He was been cleared by GI to start a diet and he was placed on D1 and HTL. SLP spoke with nurse tech who fed him breakfast and she indicated that Pt enjoyed being able to eat, but also sounds congested. Pt with long standing dysphagia from previous stroke and will always be at risk for aspiration. Pt with poor oral control resulting in labial spillage of liquids despite verbal cues from SLP (and feeder assist). Pt presents with congestion with po intake which is baseline (MBSS last year) and productive cough. Currently, the "safest diet" for Pt is puree and honey thick liquids with understanding that Pt will continue to be at risk for aspiration. Family has indicated in the past, that they would like for Pt to be able to eat by mouth as this is enjoyable for Pt. Will likely need to have ongoing discussions regarding use of feeding tubes (one was placed about a year ago and eventually removed). Continue diet as ordered, D1/puree and HTL. SLP will follow.    HPI HPI: [name] [name] is an 74 y.o. 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  He was noted to have a positive blood culture.  He was admitted to the hospital for treatment of his aspiration pneumonia, but he was also noted to have significant abdominal distention and a CT scan of the abdomen revealed an enlarged sigmoid: With possible fecal impaction.  There does not appear  to be evidence of pneumatosis.  No volvulus was seen.  A Flexi-Seal rectal tube has been placed with some output.  He does have abnormalities including leukocytosis and elevated lactic acid level. Pt is known to SLP service with last visit for MBSS 06/20/2019 with recommendation for D2 and NTL (see previous report). BSE requested.      SLP Plan  Continue with current plan of care       Recommendations  Diet recommendations: Dysphagia 1 (puree);Honey-thick liquid Liquids provided via: Cup Medication Administration: Whole meds with puree Supervision: Staff to assist with self feeding Compensations: Slow rate;Small sips/bites;Multiple dry swallows after each bite/sip Postural Changes and/or Swallow Maneuvers: Seated upright 90 degrees;Upright 30-60 min after meal                Oral Care Recommendations: Oral care BID;Staff/trained caregiver to provide oral care Follow up Recommendations: Skilled Nursing facility SLP Visit Diagnosis: Dysphagia, unspecified (R13.10) Plan: Continue with current plan of care       Thank you,  Havery Moros, CCC-SLP [phone]                 Mireille Lacombe 05/15/2020, 11:13 AM

## 2020-05-15 NOTE — Progress Notes (Signed)
ANTICOAGULATION CONSULT NOTE -   Pharmacy Consult for Lovenox (Apixaban on hold) Indication: History of DVT/PE  Allergies  Allergen Reactions  . Penicillin G   . Penicillins     Has patient had a PCN reaction causing immediate rash, facial/tongue/throat swelling, SOB or lightheadedness with hypotension: unknown Has patient had a PCN reaction causing severe rash involving mucus membranes or skin necrosis: unknown Has patient had a PCN reaction that required hospitalization: unknown Has patient had a PCN reaction occurring within the last 10 years: unknown If all of the above answers are "NO", then may proceed with Cephalosporin use. 5/3 Tolerated Cefepime x 1 dose in ED.     Patient Measurements: Height: 5\' 8"  (172.7 cm) Weight: 111.6 kg (246 lb 0.5 oz) IBW/kg (Calculated) : 68.4  Vital Signs: Temp: 97.7 F (36.5 C) (11/25 0423) BP: 120/72 (11/25 0423) Pulse Rate: 88 (11/25 0423)  Labs: Recent Labs    05/13/20 0402 05/13/20 0402 05/14/20 0525 05/15/20 0506  HGB 11.9*   < > 12.3* 12.3*  HCT 40.2  --  42.1 42.8  PLT 203  --  191 180  CREATININE 1.29*  --  1.25* 1.26*   < > = values in this interval not displayed.    Estimated Creatinine Clearance: 62.3 mL/min (A) (by C-G formula based on SCr of 1.26 mg/dL (H)).   Medical History: Past Medical History:  Diagnosis Date  . DVT (deep venous thrombosis) (HCC) 09/26/2012  . Dysphasia   . Fatty liver   . Hemiplegia (HCC)   . Hypertension   . Pneumonia   . Pulmonary embolism (HCC)   . 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(HCC)   . Stroke [facility])     Assessment: 74 y/o M with history of DVT/PE on Apixaban PTA presents to the ED with sepsis. Holding Apixaban and starting Lovenox for now. Hgb 12.4, plts good, renal function ok for q12h dosing. Continues to be stable.  Goal of Therapy:  Monitor platelets by anticoagulation protocol: Yes   Plan:  Continue Lovenox 1 mg/kg subcutaneous q12h F/U transition back to eliquis Daily  CBC/HL Monitor for bleeding  74, BS Elder Cyphers, BCPS Clinical Pharmacist Pager [phone]

## 2020-05-16 ENCOUNTER — Inpatient Hospital Stay (HOSPITAL_COMMUNITY): Payer: Medicare Other

## 2020-05-16 DIAGNOSIS — R6520 Severe sepsis without septic shock: Secondary | ICD-10-CM | POA: Diagnosis not present

## 2020-05-16 DIAGNOSIS — K5981 Ogilvie syndrome: Secondary | ICD-10-CM | POA: Diagnosis not present

## 2020-05-16 DIAGNOSIS — A419 Sepsis, unspecified organism: Secondary | ICD-10-CM | POA: Diagnosis not present

## 2020-05-16 LAB — GLUCOSE, CAPILLARY
Glucose-Capillary: 114 mg/dL — ABNORMAL HIGH (ref 70–99)
Glucose-Capillary: 116 mg/dL — ABNORMAL HIGH (ref 70–99)
Glucose-Capillary: 132 mg/dL — ABNORMAL HIGH (ref 70–99)
Glucose-Capillary: 174 mg/dL — ABNORMAL HIGH (ref 70–99)
Glucose-Capillary: 179 mg/dL — ABNORMAL HIGH (ref 70–99)
Glucose-Capillary: 192 mg/dL — ABNORMAL HIGH (ref 70–99)
Glucose-Capillary: 193 mg/dL — ABNORMAL HIGH (ref 70–99)

## 2020-05-16 MED ORDER — POLYETHYLENE GLYCOL 3350 17 G PO PACK
17.0000 g | PACK | Freq: Every day | ORAL | Status: DC
  Administered 2020-05-16 – 2020-05-18 (×3): 17 g via ORAL
  Filled 2020-05-16 (×2): qty 1

## 2020-05-16 MED ORDER — RESOURCE THICKENUP CLEAR PO POWD
ORAL | Status: DC | PRN
  Filled 2020-05-16: qty 125

## 2020-05-16 NOTE — Progress Notes (Addendum)
[name] [name], M.D. Gastroenterology & Hepatology   Interval History:  Patient is awake but does not provide any information.  Opens eyes and only answers no when asked if he has abdominal pain. No quantification of stool output but the patient has flexible connected with yellowish contents in the bag. No x-ray available today in the morning.  Inpatient Medications:  Current Facility-Administered Medications:  .  acetaminophen (TYLENOL) suppository 650 mg, 650 mg, Rectal, Q6H PRN, Adefeso, Oladapo, DO, 650 mg at 05/14/20 1529 .  dextrose 5 % with KCl 20 mEq / L  infusion, 20 mEq, Intravenous, Continuous, Barton Dubois, MD, Last Rate: 75 mL/hr at 05/15/20 0541, 20 mEq at 05/15/20 0541 .  enoxaparin (LOVENOX) injection 110 mg, 110 mg, Subcutaneous, Q12H, Erenest Blank, RPH, 110 mg at 05/16/20 0817 .  insulin aspart (novoLOG) injection 0-9 Units, 0-9 Units, Subcutaneous, Q4H, Adefeso, Oladapo, DO, 1 Units at 05/16/20 0100 .  ipratropium-albuterol (DUONEB) 0.5-2.5 (3) MG/3ML nebulizer solution 3 mL, 3 mL, Nebulization, Q6H PRN, Kc, Ramesh, MD, 3 mL at 05/14/20 0437 .  ipratropium-albuterol (DUONEB) 0.5-2.5 (3) MG/3ML nebulizer solution 3 mL, 3 mL, Nebulization, BID, Kc, Ramesh, MD, 3 mL at 05/16/20 0755 .  lactulose (CHRONULAC) enema 200 gm, 300 mL, Rectal, Daily PRN, Kc, Ramesh, MD .  meropenem (MERREM) 1 g in sodium chloride 0.9 % 100 mL IVPB, 1 g, Intravenous, Q8H, Kc, Ramesh, MD, Last Rate: 200 mL/hr at 05/16/20 0629, 1 g at 05/16/20 6834   I/O  s  Intake/Output Summary (Last 24 hours) at 05/16/2020 1236 Last data filed at 05/16/2020 0941 Gross per 24 hour  Intake 1327 ml  Output --  Net 1327 ml     Physical Exam: Temp:  [98.2 F (36.8 C)-99.1 F (37.3 C)] 98.2 F (36.8 C) (11/26 0611) Pulse Rate:  [85-99] 85 (11/26 0611) Resp:  [20-24] 24 (11/26 0611) BP: (85-140)/(48-80) 138/76 (11/26 0611) SpO2:  [85 %-99 %] 87 % (11/26 0757)  Temp (24hrs), Avg:98.7 F (37.1 C),  Min:98.2 F (36.8 C), Max:99.1 F (37.3 C) GENERAL: The patient is awake but not conversant, in no acute distress. Bedbound. HEENT: Head is normocephalic and atraumatic. Has L sided ptosis. Mouth is well hydrated and without lesions. On [place]. NECK: Supple. No masses LUNGS: Has rhonchi in bot lung fields. HEART: RRR, normal s1 and s2. ABDOMEN: diffusely distended and tympanic but not tense. Soft, nontender, no guarding, no peritoneal signs. BS +. No masses. EXTREMITIES: Without any cyanosis, clubbing, rash, lesions or edema. SKIN: no jaundice, no rashes  Laboratory Data: CBC:     Component Value Date/Time   WBC 10.5 05/15/2020 0506   RBC 4.10 (L) 05/15/2020 0506   HGB 12.3 (L) 05/15/2020 0506   HCT 42.8 05/15/2020 0506   HCT 44.5 01/06/2017 0715   PLT 180 05/15/2020 0506   MCV 104.4 (H) 05/15/2020 0506   MCH 30.0 05/15/2020 0506   MCHC 28.7 (L) 05/15/2020 0506   RDW 13.7 05/15/2020 0506   LYMPHSABS 0.4 (L) 05/10/2020 2141   MONOABS 0.6 05/10/2020 2141   EOSABS 0.0 05/10/2020 2141   BASOSABS 0.1 05/10/2020 2141   COAG:  Lab Results  Component Value Date   INR 1.3 (H) 05/11/2020   INR 1.3 (H) 05/10/2020   INR 1.4 (H) 05/14/2019   PROTIME 24.6 (A) 09/15/2015    BMP:  BMP Latest Ref Rng & Units 05/15/2020 05/14/2020 05/13/2020  Glucose 70 - 99 mg/dL 156(H) 175(H) 137(H)  BUN 8 - 23 mg/dL 22 23 27(H)  Creatinine 0.61 - 1.24 mg/dL 1.26(H) 1.25(H) 1.29(H)  Sodium 135 - 145 mmol/L 147(H) 147(H) 150(H)  Potassium 3.5 - 5.1 mmol/L 3.2(L) 3.1(L) 3.4(L)  Chloride 98 - 111 mmol/L 105 109 108  CO2 22 - 32 mmol/L 35(H) 32 33(H)  Calcium 8.9 - 10.3 mg/dL 7.9(L) 7.8(L) 8.1(L)    HEPATIC:  Hepatic Function Latest Ref Rng & Units 05/14/2020 05/13/2020 05/12/2020  Total Protein 6.5 - 8.1 g/dL 6.9 7.0 7.2  Albumin 3.5 - 5.0 g/dL 2.8(L) 2.8(L) 2.9(L)  AST 15 - 41 U/L _0 ALT 0 - 44 U/L 21 29 37  Alk Phosphatase 38 - 126 U/L 44 47 48  Total Bilirubin 0.3 - 1.2 mg/dL 0.6 0.4 0.3   Bilirubin, Direct 0.1 - 0.5 mg/dL - - -    CARDIAC:  Lab Results  Component Value Date   CKTOTAL 122 03/24/2009   CKMB 2.0 03/24/2009   TROPONINI 0.03 ([place]) 10/22/2016      Imaging: I personally reviewed and interpreted the available labs, imaging and endoscopic files.   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  The patient has been on rectal tube decompression for the last days with very mild improvement of the colonic distention.  However, there has not been any increase in the size of the lumen and the patient has not presented any significant abdominal pain recently.  Notably, there is still presence of hypokalemia in his most recent blood work-up from yesterday, which will need to be replenished before considering any other measures.  If his electrolytes are adequate and he does not present any improvement in his bowel movement output, neostigminen 2 mg IV push over 5 minutes in ICU monitor setting should be considered as the next step.  However, the patient is DNR and this should be further discussed with the family member as there is a risk of profound bradycardia and asystole.  On the other hand, coronary decompression could be attempted if the bowel lumen keeps enlarging but this is not warranted at this time -this procedure may also increase the risk of bowel perforation.  For now, will recommend moving the patient frequently to the sides, avoiding opiates and continuing enemas -ideally tapwater as lactulose may cause worsening distention.  As the patient is able to swallow pured diet, may start on Miralax for now.  # Ogilvie's  syndrome - Replenish K, check Mg - Discuss with family possibility of neostigmine and DNR status (will require ICU monitoring) - Tap water enema every 8 hours - Avoid narcotics or  - Start Miralax daily  [name] Peppers, MD Gastroenterology and Hepatology [facility]  Note: Occasional unusual wording and randomly placed punctuation marks may result from the use of speech recognition technology to transcribe this document

## 2020-05-16 NOTE — Progress Notes (Signed)
Tap water enema given after removal of rectal tube.  No results except large amount of gas.

## 2020-05-16 NOTE — Progress Notes (Signed)
PROGRESS NOTE    [name] [name]  XBJ:[id number] DOB: 16-Sep-1945 DOA: 05/10/2020 PCP: Sharee Holster, NP   Chief Complaint  Patient presents with  . 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 As per report: he was seen in the ED on late evening of 11/19 due to shortness of breath whereby he was requiring more supplemental oxygen and his oxygen was increased from 2 L/min to 3 L/min at the nursing facility, breathing treatment was also provided, but his O2 sats continued to be in the 80s, so he was taken to the ED for further evaluation.   In the ED, patient was suspected to have aspiration pneumonia due to his history of dysphagia and prior aspiration pneumonia, breathing treatment (albuterol and Atrovent) and small amount of white and occasionally yellow mucus were suctioned from the nares and throat, He was started on IV clindamycin.  CT scan done at that time does not reveal any specific abnormalities and patient had no leukocytosis, BNP was normal, respiratory panel was negative for influenza A, B and SARS coronavirus 2, so patient was discharged back to [facility] for a 7-day course of IV clindamycin. Blood culture drawn when patient came to the ED was positive for gram-positive cocci in one of the 2 cultures (possibly contaminated), but based on patient's clinical presentation, he was brought back to the emergency department. In the ED, he was febrile and tachycardic, LABS- leukocytosis, hypernatremia,, hyperglycemia, acute renal failure with BUN to  creatinine 41/1.65 (baseline creatinine at 1.0-1.2).  Lactic acidosis  2.6> 2.2.  UA unimpressive for UTI. CT abdomen and pelvis with contrast showed findings consistent with fecal impaction within the distal sigmoid colon with subsequent distal large bowel obstruction.  CT angiography of chest with contrast showed no definite evidence of acute PE, though still shows concern for PE.  CXR-persistent mild patchy bilateral opacities with possible superimposed acute infiltrate.  He was just tired and IV fluids, antibiotics and admitted for further management. Patient is followed by Dr. Lovell Sheehan from surgery. Being managed with supportive care and edema. With ongoing abdominal distention GI was consulted 11/22, recommended Flexi-Seal for decompression.  Subjective: No fever, no chest pain, no nausea, no vomiting.  Denies abdominal pain.  Continues to have abdominal distention with decreasing soft BM.  Code oxygen saturation on 2 L and has completed antibiotic therapy.  Assessment & Plan:  Severe sepsis POA likely multifactorial secondary to aspiration pneumonia also with large bowel obstruction: Vital signs stable, leukocytosis improving.  We will continue on empiric vancomycin/cefepime, blood cultures growing gram-positive cocci, culture demonstrated to be contaminants.  per SSLP okay to start PUREED and HTL diet.  Dilated colon possible obstruction versus ileus with constipation/history of chronic constipation ?OGILVIE SYNDROME: Appreciate surgery input, status post lactulose enema.  GI consulted due to ongoing decompression, advised Flexi-Seal for decompression and holding off on flex sigmoidoscopy decompression.  Continue on IV fluid hydration and electrolyte resuscitation.  Goal is for potassium above 4 and magnesium above 2. will follow GI service and general surgery recommendations.  Plan is for type water enema every 8 hours x3 and initiation of MiraLAX.  Continue to  watch patient's diet  tolerance.  Recent blood culture + in aerobic bottle only - staph in BCID 05/10/20 am- f/u culture, which appears to be contaminants.  Vancomycin discontinued on 05/14/2020.  Acute on chronic hypoxic respiratory failure from combination of aspiration pneumonia/abdominal distention: At baseline 2 L, currently needing 3 L nasal cannula.  Continue to monitor respiratory status closely, continue pulmonary support and complete antibiotic therapy.    Hypernatremia: from free water deficit from his bowel obstruction and decreased oral intake.   -Continue improving -will continue D5W infusion. -Sodium remains at 147 today.  Acute renal failure,baseline creatinine at 1.0-1.2: Multifactorial from sepsis/bowel obstruction.  Continue IV fluid hydration renal function improving.  Good urine output reported.  Creatinine 1.26 currently (very close to baseline).  Recent Labs  Lab 05/11/20 1340 05/12/20 0456 05/13/20 0402 05/14/20 0525 05/15/20 0506  BUN 40* 36* 27* 23 22  CREATININE 1.60* 1.37* 1.29* 1.25* 1.26*   Hyperkalemia at 5.3 on presentation/hypokalemia -potassium is still low currently; will continue providing repletion/supplementation as needed.  Goal is for potassium of 4  History of recurrent DVT/PE on Eliquis at baseline.  Currently n.p.o. and so cont therapeutic Lovenox.  Hyperthyroidism on methimazole, resume once able to take p.o.  Hyperlipidemia/hypertension: Blood pressure is overall stable.  Oral meds on hold   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: Appreciate speech therapy evaluation and recommendations -Dysphagia 1 with honey thick liquids has been initiated.  Hyperglycemia due to diabetes mellitus: HbA1c is still 7.9.  Blood sugar is stable despite being on D5W.  On sliding scale insulin.   Recent Labs  Lab 05/15/20 2016 05/16/20 0003 05/16/20 0422 05/16/20 0734 05/16/20 1128  GLUCAP 200* 132* 114* 116* 179*   Morbid obesity  with Body mass index is 37.41 kg/m.   -Portion control discussed with patient.  ZOX:WRUEA w/ his brother 11/21-who states he is a Product manager.Prognosis remains guarded at this time patient is DNR.He is at risk of decompensation in the setting of severe sepsis, abdominal distention bowel obstruction along with hypoxic respiratory failure and aspiration.Spoke to his brother and is updated previously.  Palliative care has been consulted and appreciate input.    Nutrition: Diet Order            DIET - DYS 1 Room service appropriate? Yes; Fluid consistency: Honey Thick; Fluid restriction: 2000 mL Fluid  Diet effective now                 DVT prophylaxis: SCDs Start: 05/11/20 0142 Code Status:   Code Status: DNR  Family Communication: No family at bedside.  Status is: Inpatient. Remains inpatient appropriate because: Ongoing management of abdominal distention renal failure respiratory failure, remains on IVF,unable to take p.o.  Dispo: The patient is from: SNF              Anticipated d/c is to: SNF              Anticipated d/c date is: > 3 days              Patient currently is not medically stable to d/c.   Consultants:GI  Procedures:see below for x-ray reports.  Culture/Microbiology    Component Value Date/Time   SDES LEFT ANTECUBITAL 05/10/2020 2230   SPECREQUEST  05/10/2020 2230    BOTTLES DRAWN AEROBIC AND ANAEROBIC Blood Culture results may not be optimal due to an inadequate volume of blood received in culture bottles   CULT  05/10/2020 2230    NO  GROWTH 5 DAYS Performed at [facility], [address], [place], [place] [address]    REPTSTATUS 05/15/2020 FINAL 05/10/2020 2230    Other culture-see note  Medications: Scheduled Meds: . enoxaparin (LOVENOX) injection  110 mg Subcutaneous Q12H  . insulin aspart  0-9 Units Subcutaneous Q4H  . ipratropium-albuterol  3 mL Nebulization BID  . polyethylene glycol  17 g Oral Daily   Continuous Infusions: . dextrose 5 % with KCl  20 mEq / L 20 mEq (05/15/20 0541)  . meropenem (MERREM) IV 1 g (05/16/20 4098)    Antimicrobials: Anti-infectives (From admission, onward)   Start     Dose/Rate Route Frequency Ordered Stop   05/13/20 0900  meropenem (MERREM) 1 g in sodium chloride 0.9 % 100 mL IVPB        1 g 200 mL/hr over 30 Minutes Intravenous Every 8 hours 05/13/20 0808     05/11/20 2200  vancomycin (VANCOREADY) IVPB 1250 mg/250 mL  Status:  Discontinued        1,250 mg 166.7 mL/hr over 90 Minutes Intravenous Every 24 hours 05/11/20 0316 05/14/20 1016   05/11/20 1000  ceFEPIme (MAXIPIME) 2 g in sodium chloride 0.9 % 100 mL IVPB  Status:  Discontinued        2 g 200 mL/hr over 30 Minutes Intravenous Every 12 hours 05/11/20 0316 05/13/20 0808   05/11/20 0300  metroNIDAZOLE (FLAGYL) IVPB 500 mg  Status:  Discontinued        500 mg 100 mL/hr over 60 Minutes Intravenous Every 8 hours 05/11/20 0214 05/13/20 0808   05/10/20 2215  aztreonam (AZACTAM) 2 g in sodium chloride 0.9 % 100 mL IVPB  Status:  Discontinued        2 g 200 mL/hr over 30 Minutes Intravenous  Once 05/10/20 2205 05/10/20 2211   05/10/20 2215  vancomycin (VANCOCIN) IVPB 1000 mg/200 mL premix  Status:  Discontinued        1,000 mg 200 mL/hr over 60 Minutes Intravenous  Once 05/10/20 2205 05/10/20 2207   05/10/20 2215  vancomycin (VANCOREADY) IVPB 2000 mg/400 mL        2,000 mg 200 mL/hr over 120 Minutes Intravenous  Once 05/10/20 2207 05/11/20 0050   05/10/20 2215  ceFEPIme (MAXIPIME) 2 g in sodium chloride 0.9 % 100 mL IVPB        2 g 200 mL/hr over 30 Minutes Intravenous  Once 05/10/20 2211 05/10/20 2328     Objective:  Today's Vitals   05/16/20 0611 05/16/20 0757 05/16/20 0900 05/16/20 1356  BP: 138/76   135/83  Pulse: 85   93  Resp: (!) 24   20  Temp: 98.2 F (36.8 C)   98.6 F (37 C)  TempSrc: Oral   Oral  SpO2: (!) 85% (!) 87%  99%  Weight:      Height:      PainSc:   0-No pain     Intake/Output Summary (Last 24 hours) at  05/16/2020 1407 Last data filed at 05/16/2020 0941 Gross per 24 hour  Intake 1077 ml  Output --  Net 1077 ml   Filed Weights   05/10/20 2122  Weight: 111.6 kg   Weight change:    Intake/Output from previous day: 11/25 0701 - 11/26 0700 In: 966 [P.O.:966] Out: 300 [Urine:300]   Intake/Output this shift: Total I/O In: 597 [P.O.:597] Out: -   Examination: General exam: Alert, awake, oriented x 2, fever and in no major distress.  Continue to have  distended abdomen and decreased bowel movements.  No nausea or vomiting. Respiratory system: Positive rhonchi bilaterally; no wheezing, no using accessory muscle.  2 L nasal cannula in place. Cardiovascular system:RRR. No murmurs, rubs, gallops.  Unable to properly assess JVD with body habitus. Gastrointestinal system: Abdomen is distended, mildly tympanic, positive bowel sounds, no tenderness to palpation.  No guarding.   Central nervous system: Alert and oriented. No new focal neurological deficits. Extremities: No cyanosis or clubbing. Skin: No petechiae. Psychiatry: Mood & affect appropriate.    Data Reviewed: I have personally reviewed following labs and imaging studies.  CBC: Recent Labs  Lab 05/10/20 0121 05/10/20 0121 05/10/20 2141 05/10/20 2141 05/11/20 0544 05/12/20 0456 05/13/20 0402 05/14/20 0525 05/15/20 0506  WBC 9.6   < > 18.8*   < > 27.2* 15.7* 12.3* 9.9 10.5  NEUTROABS 6.3  --  17.5*  --   --   --   --   --   --   HGB 12.4*   < > 12.4*   < > 11.8* 11.5* 11.9* 12.3* 12.3*  HCT 43.6   < > 44.8   < > 42.8 41.2 40.2 42.1 42.8  MCV 106.3*   < > 110.1*   < > 112.0* 107.9* 103.9* 102.7* 104.4*  PLT 235   < > 229   < > 211 197 203 191 180   < > = values in this interval not displayed.   Basic Metabolic Panel: Recent Labs  Lab 05/11/20 0544 05/11/20 0544 05/11/20 1340 05/12/20 0456 05/13/20 0402 05/14/20 0525 05/15/20 0506  NA 148*   < > 148* 151* 150* 147* 147*  K 5.3*   < > 4.9 3.9 3.4* 3.1* 3.2*  CL  103   < > 105 109 108 109 105  CO2 36*   < > 38* 34* 33* 32 35*  GLUCOSE 173*   < > 130* 114* 137* 175* 156*  BUN 42*   < > 40* 36* 27* 23 22  CREATININE 1.78*   < > 1.60* 1.37* 1.29* 1.25* 1.26*  CALCIUM 8.3*   < > 8.1* 8.3* 8.1* 7.8* 7.9*  MG 2.3  --   --   --   --   --   --   PHOS 4.7*  --   --   --   --   --   --    < > = values in this interval not displayed.   GFR: Estimated Creatinine Clearance: 62.3 mL/min (A) (by C-G formula based on SCr of 1.26 mg/dL (H)).   Liver Function Tests: Recent Labs  Lab 05/10/20 0121 05/11/20 0544 05/12/20 0456 05/13/20 0402 05/14/20 0525  AST 19 68* 30 30 30   ALT 18 55* 37 29 21  ALKPHOS 51 52 48 47 44  BILITOT 0.3 0.6 0.3 0.4 0.6  PROT 7.7 7.3 7.2 7.0 6.9  ALBUMIN 3.2* 3.0* 2.9* 2.8* 2.8*   Coagulation Profile: Recent Labs  Lab 05/10/20 2141 05/11/20 0544  INR 1.3* 1.3*   CBG: Recent Labs  Lab 05/15/20 2016 05/16/20 0003 05/16/20 0422 05/16/20 0734 05/16/20 1128  GLUCAP 200* 132* 114* 116* 179*   Sepsis Labs: Recent Labs  Lab 05/10/20 2141 05/10/20 2320 05/11/20 0544 05/11/20 0916  LATICACIDVEN 2.6* 2.2* 1.6 1.3    Recent Results (from the past 240 hour(s))  Resp Panel by RT-PCR (Flu A&B, Covid) Nasopharyngeal Swab     Status: None   Collection Time: 05/09/20 11:59 PM   Specimen: Nasopharyngeal Swab; Nasopharyngeal(NP)  swabs in vial transport medium  Result Value Ref Range Status   SARS Coronavirus 2 by RT PCR NEGATIVE NEGATIVE Final    Comment: (NOTE) SARS-CoV-2 target nucleic acids are NOT DETECTED.  The SARS-CoV-2 RNA is generally detectable in upper respiratory specimens during the acute phase of infection. The lowest concentration of SARS-CoV-2 viral copies this assay can detect is 138 copies/mL. A negative result does not preclude SARS-Cov-2 infection and should not be used as the sole basis for treatment or other patient management decisions. A negative result may occur with  improper specimen  collection/handling, submission of specimen other than nasopharyngeal swab, presence of viral mutation(s) within the areas targeted by this assay, and inadequate number of viral copies(<138 copies/mL). A negative result must be combined with clinical observations, patient history, and epidemiological information. The expected result is Negative.  Fact Sheet for Patients:  BloggerCourse.comhttps://www.fda.gov/media/152166/download  Fact Sheet for Healthcare Providers:  SeriousBroker.ithttps://www.fda.gov/media/152162/download  This test is no t yet approved or cleared by the [place] FDA and  has been authorized for detection and/or diagnosis of SARS-CoV-2 by FDA under an Emergency Use Authorization (EUA). This EUA will remain  in effect (meaning this test can be used) for the duration of the COVID-19 declaration under Section 564(b)(1) of the Act, 21 U.S.C.section 360bbb-3(b)(1), unless the authorization is terminated  or revoked sooner.       Influenza A by PCR NEGATIVE NEGATIVE Final   Influenza B by PCR NEGATIVE NEGATIVE Final    Comment: (NOTE) The Xpert Xpress SARS-CoV-2/FLU/RSV plus assay is intended as an aid in the diagnosis of influenza from Nasopharyngeal swab specimens and should not be used as a sole basis for treatment. Nasal washings and aspirates are unacceptable for Xpert Xpress SARS-CoV-2/FLU/RSV testing.  Fact Sheet for Patients: BloggerCourse.comhttps://www.fda.gov/media/152166/download  Fact Sheet for Healthcare Providers: SeriousBroker.ithttps://www.fda.gov/media/152162/download  This test is not yet approved or cleared by the [place] FDA and has been authorized for detection and/or diagnosis of SARS-CoV-2 by FDA under an Emergency Use Authorization (EUA). This EUA will remain in effect (meaning this test can be used) for the duration of the COVID-19 declaration under Section 564(b)(1) of the Act, 21 U.S.C. section 360bbb-3(b)(1), unless the authorization is terminated or revoked.  Performed at [facility], [address] [place], [place] [address]   Culture, blood (routine x 2)     Status: Abnormal   Collection Time: 05/10/20  1:21 AM   Specimen: BLOOD RIGHT HAND  Result Value Ref Range Status   Specimen Description   Final    BLOOD RIGHT HAND Performed at [facility], [address] [place], [place] [address]    Special Requests   Final    BOTTLES DRAWN AEROBIC AND ANAEROBIC Blood Culture adequate volume Performed at [facility], [address] [place], [place] [address]    Culture  Setup Time   Final    GRAM POSITIVE COCCI Gram Stain Report Called to,Read Back By and Verified With: WATLINGTON,C @ 2014 ON 05/10/20 BY JUW ANAEROBIC BOTTLE ONLY GS DONE @ [facility] CRITICAL RESULT CALLED TO, READ BACK BY AND VERIFIED WITH: C. TURNER,RN [id number] 05/11/2020 T. TYSOR    Culture (A)  Final    STAPHYLOCOCCUS HOMINIS THE SIGNIFICANCE OF ISOLATING THIS ORGANISM FROM A SINGLE SET OF BLOOD CULTURES WHEN MULTIPLE SETS ARE DRAWN IS UNCERTAIN. PLEASE NOTIFY THE MICROBIOLOGY DEPARTMENT WITHIN ONE WEEK IF SPECIATION AND SENSITIVITIES ARE REQUIRED. Performed at [facility], [address] [place], [place] [address]  Report Status 05/13/2020 FINAL  Final  Blood Culture ID Panel (Reflexed)     Status: Abnormal   Collection Time: 05/10/20  1:21 AM  Result Value Ref Range Status   Enterococcus faecalis NOT DETECTED NOT DETECTED Final   Enterococcus Faecium NOT DETECTED NOT DETECTED Final   Listeria monocytogenes NOT DETECTED NOT DETECTED Final   Staphylococcus species DETECTED (A) NOT DETECTED Final    Comment: CRITICAL RESULT CALLED TO, READ BACK BY AND VERIFIED WITH: CLuiz Ochoa 2440 05/11/2020 T. TYSOR    Staphylococcus aureus (BCID) NOT DETECTED NOT DETECTED Final   Staphylococcus epidermidis NOT DETECTED NOT DETECTED Final   Staphylococcus lugdunensis NOT DETECTED NOT DETECTED Final   Streptococcus species NOT DETECTED NOT DETECTED Final   Streptococcus agalactiae NOT DETECTED NOT  DETECTED Final   Streptococcus pneumoniae NOT DETECTED NOT DETECTED Final   Streptococcus pyogenes NOT DETECTED NOT DETECTED Final   A.calcoaceticus-baumannii NOT DETECTED NOT DETECTED Final   Bacteroides fragilis NOT DETECTED NOT DETECTED Final   Enterobacterales NOT DETECTED NOT DETECTED Final   Enterobacter cloacae complex NOT DETECTED NOT DETECTED Final   Escherichia coli NOT DETECTED NOT DETECTED Final   Klebsiella aerogenes NOT DETECTED NOT DETECTED Final   Klebsiella oxytoca NOT DETECTED NOT DETECTED Final   Klebsiella pneumoniae NOT DETECTED NOT DETECTED Final   Proteus species NOT DETECTED NOT DETECTED Final   Salmonella species NOT DETECTED NOT DETECTED Final   Serratia marcescens NOT DETECTED NOT DETECTED Final   Haemophilus influenzae NOT DETECTED NOT DETECTED Final   Neisseria meningitidis NOT DETECTED NOT DETECTED Final   Pseudomonas aeruginosa NOT DETECTED NOT DETECTED Final   Stenotrophomonas maltophilia NOT DETECTED NOT DETECTED Final   Candida albicans NOT DETECTED NOT DETECTED Final   Candida auris NOT DETECTED NOT DETECTED Final   Candida glabrata NOT DETECTED NOT DETECTED Final   Candida krusei NOT DETECTED NOT DETECTED Final   Candida parapsilosis NOT DETECTED NOT DETECTED Final   Candida tropicalis NOT DETECTED NOT DETECTED Final   Cryptococcus neoformans/gattii NOT DETECTED NOT DETECTED Final    Comment: Performed at [facility], [address], [place], [place] [address]  Culture, blood (routine x 2)     Status: Abnormal   Collection Time: 05/10/20  1:29 AM   Specimen: BLOOD LEFT HAND  Result Value Ref Range Status   Specimen Description   Final    BLOOD LEFT HAND Performed at [facility], [address], [place], [place] [address]    Special Requests   Final    BOTTLES DRAWN AEROBIC AND ANAEROBIC Blood Culture adequate volume Performed at [facility], [address] [place], [place] [address]    Culture  Setup Time   Final    GRAM POSITIVE  COCCI Gram Stain Report Called to,Read Back By and Verified With: TURNER,C @ 2333 ON 05/10/20 BY JUW IN BOTH AEROBIC AND ANAEROBIC BOTTLES GS DONE @ [facility] CRITICAL VALUE NOTED.  VALUE IS CONSISTENT WITH PREVIOUSLY REPORTED AND CALLED VALUE. Performed at [facility], [address], [place], [place] [address]    Culture STAPHYLOCOCCUS CAPITIS (A)  Final   Report Status 05/15/2020 FINAL  Final   Organism ID, Bacteria STAPHYLOCOCCUS CAPITIS  Final      Susceptibility   Staphylococcus capitis - MIC*    CIPROFLOXACIN 4 RESISTANT Resistant     ERYTHROMYCIN >=8 RESISTANT Resistant     GENTAMICIN <=0.5 SENSITIVE Sensitive     OXACILLIN <=0.25 SENSITIVE Sensitive     TETRACYCLINE >=16  RESISTANT Resistant     VANCOMYCIN <=0.5 SENSITIVE Sensitive     TRIMETH/SULFA <=10 SENSITIVE Sensitive     CLINDAMYCIN >=8 RESISTANT Resistant     RIFAMPIN <=0.5 SENSITIVE Sensitive     Inducible Clindamycin NEGATIVE Sensitive     * STAPHYLOCOCCUS CAPITIS  Urine Culture     Status: Abnormal   Collection Time: 05/10/20  9:37 AM   Specimen: Urine, Catheterized  Result Value Ref Range Status   Specimen Description   Final    URINE, CATHETERIZED Performed at [facility], [address], [place], [place] [address]    Special Requests   Final    NONE Performed at [facility], [address], [place], [place] [address]    Culture >=100,000 COLONIES/mL CITROBACTER KOSERI (A)  Final   Report Status 05/12/2020 FINAL  Final   Organism ID, Bacteria CITROBACTER KOSERI (A)  Final      Susceptibility   Citrobacter koseri - MIC*    CEFAZOLIN >=64 RESISTANT Resistant     CEFEPIME <=0.12 SENSITIVE Sensitive     CEFTRIAXONE >=64 RESISTANT Resistant     CIPROFLOXACIN >=4 RESISTANT Resistant     GENTAMICIN <=1 SENSITIVE Sensitive     IMIPENEM 0.5 SENSITIVE Sensitive     NITROFURANTOIN 64 INTERMEDIATE Intermediate     TRIMETH/SULFA <=20 SENSITIVE Sensitive     PIP/TAZO 16 SENSITIVE Sensitive     * >=100,000  COLONIES/mL CITROBACTER KOSERI  Culture, blood (Routine X 2) w Reflex to ID Panel     Status: Abnormal   Collection Time: 05/10/20  9:41 PM   Specimen: Right Antecubital; Blood  Result Value Ref Range Status   Specimen Description   Final    RIGHT ANTECUBITAL Performed at [facility], [address] [place], [place] [address]    Special Requests   Final    BOTTLES DRAWN AEROBIC AND ANAEROBIC Blood Culture adequate volume Performed at [facility], [address] [place], [place] [address]    Culture  Setup Time   Final    GRAM POSITIVE COCCI IN CLUSTERS RECOVERED FROM THE ANAEROBIC BOTTLE Gram Stain Report Called to,Read Back By and Verified With: WRIGHT,EMILY AT 1314 ON 05/12/2020 BY BAUGHAM,M. Performed at [facility] CRITICAL RESULT CALLED TO, READ BACK BY AND VERIFIED WITHCorliss Blacker RN [id number] 05/12/20 A BROWNING Performed at [facility], [address] [place], [place] [address]    Culture STAPHYLOCOCCUS CAPITIS (A)  Final   Report Status 05/14/2020 FINAL  Final   Organism ID, Bacteria STAPHYLOCOCCUS CAPITIS  Final      Susceptibility   Staphylococcus capitis - MIC*    CIPROFLOXACIN <=0.5 SENSITIVE Sensitive     ERYTHROMYCIN >=8 RESISTANT Resistant     GENTAMICIN <=0.5 SENSITIVE Sensitive     OXACILLIN SENSITIVE Sensitive     TETRACYCLINE >=16 RESISTANT Resistant     VANCOMYCIN <=0.5 SENSITIVE Sensitive     TRIMETH/SULFA <=10 SENSITIVE Sensitive     CLINDAMYCIN >=8 RESISTANT Resistant     RIFAMPIN <=0.5 SENSITIVE Sensitive     Inducible Clindamycin NEGATIVE Sensitive     * STAPHYLOCOCCUS CAPITIS  Blood Culture ID Panel (Reflexed)     Status: Abnormal   Collection Time: 05/10/20  9:41 PM  Result Value Ref Range Status   Enterococcus faecalis NOT DETECTED NOT DETECTED Final   Enterococcus Faecium NOT DETECTED NOT DETECTED Final   Listeria monocytogenes NOT DETECTED NOT DETECTED Final   Staphylococcus species DETECTED (A) NOT DETECTED Final    Comment:  CRITICAL RESULT CALLED TO, READ BACK BY AND VERIFIED WITHCorliss Blacker RN [id number] 05/12/20 A BROWNING    Staphylococcus aureus (BCID) NOT DETECTED NOT DETECTED Final   Staphylococcus epidermidis NOT DETECTED NOT DETECTED Final   Staphylococcus lugdunensis NOT DETECTED NOT DETECTED Final   Streptococcus species NOT DETECTED NOT DETECTED Final   Streptococcus agalactiae NOT DETECTED NOT DETECTED Final   Streptococcus pneumoniae NOT DETECTED NOT DETECTED Final   Streptococcus pyogenes NOT DETECTED NOT DETECTED Final   A.calcoaceticus-baumannii NOT DETECTED NOT DETECTED Final   Bacteroides fragilis NOT DETECTED NOT DETECTED Final   Enterobacterales NOT DETECTED NOT DETECTED Final   Enterobacter cloacae complex NOT DETECTED NOT DETECTED Final   Escherichia coli NOT DETECTED NOT DETECTED Final   Klebsiella aerogenes NOT DETECTED NOT DETECTED Final   Klebsiella oxytoca NOT DETECTED NOT DETECTED Final   Klebsiella pneumoniae NOT DETECTED NOT DETECTED Final   Proteus species NOT DETECTED NOT DETECTED Final   Salmonella species NOT DETECTED NOT DETECTED Final   Serratia marcescens NOT DETECTED NOT DETECTED Final   Haemophilus influenzae NOT DETECTED NOT DETECTED Final   Neisseria meningitidis NOT DETECTED NOT DETECTED Final   Pseudomonas aeruginosa NOT DETECTED NOT DETECTED Final   Stenotrophomonas maltophilia NOT DETECTED NOT DETECTED Final   Candida albicans NOT DETECTED NOT DETECTED Final   Candida auris NOT DETECTED NOT DETECTED Final   Candida glabrata NOT DETECTED NOT DETECTED Final   Candida krusei NOT DETECTED NOT DETECTED Final   Candida parapsilosis NOT DETECTED NOT DETECTED Final   Candida tropicalis NOT DETECTED NOT DETECTED Final   Cryptococcus neoformans/gattii NOT DETECTED NOT DETECTED Final    Comment: Performed at [facility], [address], [place], [place] [address]  Urine culture     Status: None   Collection Time: 05/10/20 10:02 PM   Specimen: In/Out Cath Urine   Result Value Ref Range Status   Specimen Description   Final    IN/OUT CATH URINE Performed at [facility], [address] [place], [place] [address]    Special Requests   Final    NONE Performed at [facility], [address], [place], [place] [address]    Culture   Final    NO GROWTH Performed at [facility], [address], [place], [place] [address]    Report Status 05/12/2020 FINAL  Final  Culture, blood (Routine X 2) w Reflex to ID Panel     Status: None   Collection Time: 05/10/20 10:30 PM   Specimen: Left Antecubital; Blood  Result Value Ref Range Status   Specimen Description LEFT ANTECUBITAL  Final   Special Requests   Final    BOTTLES DRAWN AEROBIC AND ANAEROBIC Blood Culture results may not be optimal due to an inadequate volume of blood received in culture bottles   Culture   Final    NO GROWTH 5 DAYS Performed at [facility], [address], [place], [place] [address]    Report Status 05/15/2020 FINAL  Final  MRSA PCR Screening     Status: Abnormal   Collection Time: 05/11/20  2:14 AM   Specimen: Nasal Mucosa; Nasopharyngeal  Result Value Ref Range Status   MRSA by PCR POSITIVE (A) NEGATIVE Final    Comment:        The GeneXpert MRSA Assay (FDA approved for NASAL specimens only), is one component of a comprehensive MRSA colonization surveillance program. It is not intended to diagnose MRSA infection nor to guide or monitor treatment  for MRSA infections. RESULT CALLED TO, READ BACK BY AND VERIFIED WITH: SINSKIE,P @ 0514 ON 05/11/20 BY JUW Performed at [facility], [address], [place], [place] [address]      Radiology Studies: DG Abd 1 View  Result Date: 05/16/2020 CLINICAL DATA:  Ileus. EXAM: ABDOMEN - 1 VIEW COMPARISON:  05/15/2020.  CT 05/10/2020. FINDINGS: Persistent severe distention of the sigmoid colon noted. Sigmoid colonic distention may have progressed from prior exam. Persistent large amount of stool noted throughout the colon.  No free air identified. Hemidiaphragms incompletely visualized. Degenerative change in scoliosis lumbar spine. Degenerative changes both hips. Aortoiliac atherosclerotic vascular calcification. IMPRESSION: Persistent severe distention of the sigmoid colon noted. Sigmoid colonic distention may have progressed from prior exam. Persistent large amount of stool noted throughout the colon. No free air identified. Electronically Signed   By: Maisie Fus  Register   On: 05/16/2020 13:47   DG Abd Portable 1V  Result Date: 05/15/2020 CLINICAL DATA:  Abdominal distension. EXAM: PORTABLE ABDOMEN - 1 VIEW COMPARISON:  Abdominal radiograph 05/13/2020 FINDINGS: Persistent marked gaseous distension of the sigmoid colon. Stool throughout the colon. No definite dilated loops of small bowel identified. Supine evaluation limited for the detection of free intraperitoneal air. Lumbar spine degenerative changes. IMPRESSION: Persistent marked gaseous distension of the sigmoid colon. Electronically Signed   By: Annia Belt M.D.   On: 05/15/2020 08:07    LOS: 5 days    Time spent 30 minutes in the care of this patient  Vassie Loll, MD [facility]  05/16/2020, 2:07 PM

## 2020-05-16 NOTE — Progress Notes (Signed)
  Speech Language Pathology Treatment: Dysphagia  Patient Details Name: [name] [name] MRN: [id number] DOB: [birth year] Today's Date: 05/16/2020 Time: 3976-7341 SLP Time Calculation (min) (ACUTE ONLY): 15 min  Assessment / Plan / Recommendation Clinical Impression  Ongoing diagnostic dysphagia therapy provided today with Pt's lunch tray. NT was feeding Pt when SLP entered the room and SLP suggested Pt be repositioned for optimal swallowing positioning. Pt protested but did comply with repositioning and bed adjustment to upright positioning. Pt consumed puree textures and HTL with feeder demonstrating poor oral control, anterior spillage and very congested sounding respirations and inconsistent coughing, however, Pt is known to SLP from SNF and previous admissions and this is Pt's baseline. Pt continues to be at high risk for aspiration; Pt is typically very impulsive, however feeder helps facilitate a slower consumption rate. Recommend continue with current diet of D1/puree and HTL, with 1:1 feeder. ST will continue to follow acutely.    HPI HPI: [name] [name] is an 74 y.o. 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  He was noted to have a positive blood culture.  He was admitted to the hospital for treatment of his aspiration pneumonia, but he was also noted to have significant abdominal distention and a CT scan of the abdomen revealed an enlarged sigmoid: With possible fecal impaction.  There does not appear to be evidence of pneumatosis.  No volvulus was seen.  A Flexi-Seal rectal tube has been placed with some output.  He does have abnormalities including leukocytosis and elevated lactic acid level. Pt is known to SLP service  with last visit for MBSS 06/20/2019 with recommendation for D2 and NTL (see previous report). BSE requested.      SLP Plan  Continue with current plan of care       Recommendations  Diet recommendations: Dysphagia 1 (puree);Honey-thick liquid Liquids provided via: Cup Medication Administration: Whole meds with puree Supervision: Staff to assist with self feeding Compensations: Slow rate;Small sips/bites;Multiple dry swallows after each bite/sip Postural Changes and/or Swallow Maneuvers: Seated upright 90 degrees;Upright 30-60 min after meal                Oral Care Recommendations: Oral care BID;Staff/trained caregiver to provide oral care Follow up Recommendations: Skilled Nursing facility SLP Visit Diagnosis: Dysphagia, unspecified (R13.10) Plan: Continue with current plan of care       Cristin Szatkowski H. Romie Levee, CCC-SLP Speech Language Pathologist    Georgetta Haber 05/16/2020, 1:20 PM

## 2020-05-17 ENCOUNTER — Inpatient Hospital Stay (HOSPITAL_COMMUNITY): Payer: Medicare Other

## 2020-05-17 DIAGNOSIS — K5981 Ogilvie syndrome: Secondary | ICD-10-CM | POA: Diagnosis not present

## 2020-05-17 LAB — MAGNESIUM: Magnesium: 2.5 mg/dL — ABNORMAL HIGH (ref 1.7–2.4)

## 2020-05-17 LAB — GLUCOSE, CAPILLARY
Glucose-Capillary: 115 mg/dL — ABNORMAL HIGH (ref 70–99)
Glucose-Capillary: 150 mg/dL — ABNORMAL HIGH (ref 70–99)
Glucose-Capillary: 153 mg/dL — ABNORMAL HIGH (ref 70–99)
Glucose-Capillary: 159 mg/dL — ABNORMAL HIGH (ref 70–99)
Glucose-Capillary: 167 mg/dL — ABNORMAL HIGH (ref 70–99)
Glucose-Capillary: 195 mg/dL — ABNORMAL HIGH (ref 70–99)

## 2020-05-17 MED ORDER — METOCLOPRAMIDE HCL 5 MG/ML IJ SOLN
10.0000 mg | Freq: Two times a day (BID) | INTRAMUSCULAR | Status: AC
  Administered 2020-05-17 – 2020-05-18 (×2): 10 mg via INTRAVENOUS
  Filled 2020-05-17 (×2): qty 2

## 2020-05-17 NOTE — Progress Notes (Signed)
Tap water enema given. Patient had a moderate amount of hard Eupha Lobb ball stool. Will round on patient and continue to assess bowel function.

## 2020-05-17 NOTE — Progress Notes (Signed)
PROGRESS NOTE    [name] [name]  XBJ:[id number] DOB: 16-Sep-1945 DOA: 05/10/2020 PCP: Sharee Holster, NP   Chief Complaint  Patient presents with  . 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 As per report: he was seen in the ED on late evening of 11/19 due to shortness of breath whereby he was requiring more supplemental oxygen and his oxygen was increased from 2 L/min to 3 L/min at the nursing facility, breathing treatment was also provided, but his O2 sats continued to be in the 80s, so he was taken to the ED for further evaluation.   In the ED, patient was suspected to have aspiration pneumonia due to his history of dysphagia and prior aspiration pneumonia, breathing treatment (albuterol and Atrovent) and small amount of white and occasionally yellow mucus were suctioned from the nares and throat, He was started on IV clindamycin.  CT scan done at that time does not reveal any specific abnormalities and patient had no leukocytosis, BNP was normal, respiratory panel was negative for influenza A, B and SARS coronavirus 2, so patient was discharged back to [facility] for a 7-day course of IV clindamycin. Blood culture drawn when patient came to the ED was positive for gram-positive cocci in one of the 2 cultures (possibly contaminated), but based on patient's clinical presentation, he was brought back to the emergency department. In the ED, he was febrile and tachycardic, LABS- leukocytosis, hypernatremia,, hyperglycemia, acute renal failure with BUN to  creatinine 41/1.65 (baseline creatinine at 1.0-1.2).  Lactic acidosis  2.6> 2.2.  UA unimpressive for UTI. CT abdomen and pelvis with contrast showed findings consistent with fecal impaction within the distal sigmoid colon with subsequent distal large bowel obstruction.  CT angiography of chest with contrast showed no definite evidence of acute PE, though still shows concern for PE.  CXR-persistent mild patchy bilateral opacities with possible superimposed acute infiltrate.  He was just tired and IV fluids, antibiotics and admitted for further management. Patient is followed by Dr. Lovell Sheehan from surgery. Being managed with supportive care and edema. With ongoing abdominal distention GI was consulted 11/22, recommended Flexi-Seal for decompression.  Subjective: No fever, no chest pain, no nausea, no vomiting.  Denies abdominal pain.  Continues to have abdominal distention with decreasing soft BM.  Code oxygen saturation on 2 L and has completed antibiotic therapy.  Assessment & Plan:  Severe sepsis POA likely multifactorial secondary to aspiration pneumonia also with large bowel obstruction: Vital signs stable, leukocytosis improving.  We will continue on empiric vancomycin/cefepime, blood cultures growing gram-positive cocci, culture demonstrated to be contaminants.  per SSLP okay to start PUREED and HTL diet.  Dilated colon possible obstruction versus ileus with constipation/history of chronic constipation ?OGILVIE SYNDROME: Appreciate surgery input, status post lactulose enema.  GI consulted due to ongoing decompression, advised Flexi-Seal for decompression and holding off on flex sigmoidoscopy decompression.  Continue on IV fluid hydration and electrolyte resuscitation.  Goal is for potassium above 4 and magnesium above 2. will follow GI service and general surgery recommendations.  Plan is for type water enema every 8 hours x3 and initiation of MiraLAX.  Continue to  watch patient's diet tolerance.  Will also add X 2 doses of reglan. If this failed will proceed with neostigmine tx.  Recent blood culture + in aerobic bottle only - staph in BCID 05/10/20 am- f/u culture, which appears to be contaminants.  Vancomycin discontinued on 05/14/2020.  Acute on chronic hypoxic respiratory failure from combination of aspiration pneumonia/abdominal distention: At baseline 2 L, currently needing 3 L nasal cannula.  Continue to monitor respiratory status closely, continue pulmonary support and complete antibiotic therapy (plan is for a total of 8 days meropenem).    Hypernatremia: from free water deficit from his bowel obstruction and decreased oral intake.   -Continue improving -will continue D5W infusion. -Sodium remains at 147 today.  Acute renal failure,baseline creatinine at 1.0-1.2: Multifactorial from sepsis/bowel obstruction.  Continue IV fluid hydration renal function improving.  Good urine output reported.  Creatinine 1.26 currently (very close to baseline).  Recent Labs  Lab 05/11/20 1340 05/12/20 0456 05/13/20 0402 05/14/20 0525 05/15/20 0506  BUN 40* 36* 27* 23 22  CREATININE 1.60* 1.37* 1.29* 1.25* 1.26*   Hyperkalemia at 5.3 on presentation/hypokalemia -potassium is still low currently; will continue providing repletion/supplementation as needed.  Goal is for potassium of 4 and Mg >2  History of recurrent DVT/PE on Eliquis at baseline.  Currently n.p.o. and so cont therapeutic Lovenox.  Hyperthyroidism on methimazole, resume once able to take p.o.  Hyperlipidemia/hypertension: Blood pressure is overall stable.  Oral meds on hold   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: Appreciate speech therapy evaluation and recommendations -Dysphagia 1 with honey thick liquids has been initiated.  Hyperglycemia due to diabetes mellitus: HbA1c is still 7.9.  Blood sugar is stable despite being on D5W.   -continue sliding scale insulin.   Recent Labs   Lab 05/17/20 0017 05/17/20 0406 05/17/20 0732 05/17/20 1111 05/17/20 1604  GLUCAP 167* 159* 115* 153* 150*   Morbid obesity with Body mass index is 37.41 kg/m.   -Portion control discussed with patient.  AVW:UJWJX w/ his brother 11/27-who states he is a Product manager. Prognosis remains guarded at this time patient is DNR. He is at risk of decompensation in the setting of severe sepsis, abdominal distention bowel obstruction along with hypoxic respiratory failure and aspiration. Palliative care has been consulted and appreciate input and assistance.  Nutrition: Diet Order            DIET - DYS 1 Room service appropriate? Yes; Fluid consistency: Honey Thick; Fluid restriction: 2000 mL Fluid  Diet effective now                 DVT prophylaxis: SCDs Start: 05/11/20 0142 Code Status:   Code Status: DNR  Family Communication: No family at bedside.  Status is: Inpatient. Remains inpatient appropriate because: Ongoing management of abdominal distention renal failure respiratory failure, remains on IVF,unable to take p.o.  Dispo: The patient is from: SNF              Anticipated d/c is to: SNF              Anticipated d/c date is: > 3 days              Patient currently is not medically stable to d/c.   Consultants:GI  Procedures:see below for x-ray reports.  Culture/Microbiology    Component Value Date/Time   SDES LEFT ANTECUBITAL 05/10/2020 2230   SPECREQUEST  05/10/2020 2230    BOTTLES DRAWN AEROBIC AND ANAEROBIC Blood Culture results may not  be optimal due to an inadequate volume of blood received in culture bottles   CULT  05/10/2020 2230    NO GROWTH 5 DAYS Performed at [facility], [address], [place], [place] [address]    REPTSTATUS 05/15/2020 FINAL 05/10/2020 2230    Other culture-see note  Medications: Scheduled Meds: . enoxaparin (LOVENOX) injection  110 mg Subcutaneous Q12H  . insulin aspart  0-9 Units Subcutaneous Q4H  . ipratropium-albuterol  3 mL  Nebulization BID  . metoCLOPramide (REGLAN) injection  10 mg Intravenous Q12H  . polyethylene glycol  17 g Oral Daily   Continuous Infusions: . dextrose 5 % with KCl 20 mEq / L 20 mEq (05/17/20 1835)  . meropenem (MERREM) IV 1 g (05/17/20 1340)    Antimicrobials: Anti-infectives (From admission, onward)   Start     Dose/Rate Route Frequency Ordered Stop   05/13/20 0900  meropenem (MERREM) 1 g in sodium chloride 0.9 % 100 mL IVPB        1 g 200 mL/hr over 30 Minutes Intravenous Every 8 hours 05/13/20 0808     05/11/20 2200  vancomycin (VANCOREADY) IVPB 1250 mg/250 mL  Status:  Discontinued        1,250 mg 166.7 mL/hr over 90 Minutes Intravenous Every 24 hours 05/11/20 0316 05/14/20 1016   05/11/20 1000  ceFEPIme (MAXIPIME) 2 g in sodium chloride 0.9 % 100 mL IVPB  Status:  Discontinued        2 g 200 mL/hr over 30 Minutes Intravenous Every 12 hours 05/11/20 0316 05/13/20 0808   05/11/20 0300  metroNIDAZOLE (FLAGYL) IVPB 500 mg  Status:  Discontinued        500 mg 100 mL/hr over 60 Minutes Intravenous Every 8 hours 05/11/20 0214 05/13/20 0808   05/10/20 2215  aztreonam (AZACTAM) 2 g in sodium chloride 0.9 % 100 mL IVPB  Status:  Discontinued        2 g 200 mL/hr over 30 Minutes Intravenous  Once 05/10/20 2205 05/10/20 2211   05/10/20 2215  vancomycin (VANCOCIN) IVPB 1000 mg/200 mL premix  Status:  Discontinued        1,000 mg 200 mL/hr over 60 Minutes Intravenous  Once 05/10/20 2205 05/10/20 2207   05/10/20 2215  vancomycin (VANCOREADY) IVPB 2000 mg/400 mL        2,000 mg 200 mL/hr over 120 Minutes Intravenous  Once 05/10/20 2207 05/11/20 0050   05/10/20 2215  ceFEPIme (MAXIPIME) 2 g in sodium chloride 0.9 % 100 mL IVPB        2 g 200 mL/hr over 30 Minutes Intravenous  Once 05/10/20 2211 05/10/20 2328     Objective:  Today's Vitals   05/16/20 2039 05/17/20 0537 05/17/20 0728 05/17/20 1600  BP: (!) 100/51 (!) 91/56  120/71  Pulse: 90 90  92  Resp: 19 19  18   Temp: 98 F  (36.7 C) 98.4 F (36.9 C)  98.1 F (36.7 C)  TempSrc:    Axillary  SpO2: 95% 100% (!) 89% 99%  Weight:      Height:      PainSc:        Intake/Output Summary (Last 24 hours) at 05/17/2020 1842 Last data filed at 05/17/2020 0500 Gross per 24 hour  Intake --  Output 600 ml  Net -600 ml   Filed Weights   05/10/20 2122  Weight: 111.6 kg   Weight change:    Intake/Output from previous day: 11/26 0701 - 11/27 0700 In: 837 [P.O.:837]  Out: 600 [Urine:600]   Intake/Output this shift: No intake/output data recorded.  Examination: General exam: Lethargic/somnolent; continue to have significant abdominal distention.  Some small bowel movements achieved after using tap water enema  Respiratory system: Diffuse rhonchi bilaterally; no wheezing, no using accessory muscle.  Good O2 sat on 2-3 L nasal cannula supplementation. Cardiovascular system:RRR. No murmurs, rubs, gallops.  Unable to assess JVD with body habitus. Gastrointestinal system: Abdomen is significantly distended and tense; decreased bowel sounds appreciated.  No tenderness on palpation. Central nervous system: Alert and oriented. No new focal neurological deficits. Extremities: No cyanosis or clubbing. Skin: No petechiae. Psychiatry:  Mood & affect appropriate.    Data Reviewed: I have personally reviewed following labs and imaging studies.  CBC: Recent Labs  Lab 05/10/20 2141 05/10/20 2141 05/11/20 0544 05/12/20 0456 05/13/20 0402 05/14/20 0525 05/15/20 0506  WBC 18.8*   < > 27.2* 15.7* 12.3* 9.9 10.5  NEUTROABS 17.5*  --   --   --   --   --   --   HGB 12.4*   < > 11.8* 11.5* 11.9* 12.3* 12.3*  HCT 44.8   < > 42.8 41.2 40.2 42.1 42.8  MCV 110.1*   < > 112.0* 107.9* 103.9* 102.7* 104.4*  PLT 229   < > 211 197 203 191 180   < > = values in this interval not displayed.   Basic Metabolic Panel: Recent Labs  Lab 05/11/20 0544 05/11/20 0544 05/11/20 1340 05/12/20 0456 05/13/20 0402 05/14/20 0525  05/15/20 0506 05/17/20 0738  NA 148*   < > 148* 151* 150* 147* 147*  --   K 5.3*   < > 4.9 3.9 3.4* 3.1* 3.2*  --   CL 103   < > 105 109 108 109 105  --   CO2 36*   < > 38* 34* 33* 32 35*  --   GLUCOSE 173*   < > 130* 114* 137* 175* 156*  --   BUN 42*   < > 40* 36* 27* 23 22  --   CREATININE 1.78*   < > 1.60* 1.37* 1.29* 1.25* 1.26*  --   CALCIUM 8.3*   < > 8.1* 8.3* 8.1* 7.8* 7.9*  --   MG 2.3  --   --   --   --   --   --  2.5*  PHOS 4.7*  --   --   --   --   --   --   --    < > = values in this interval not displayed.   GFR: Estimated Creatinine Clearance: 62.3 mL/min (A) (by C-G formula based on SCr of 1.26 mg/dL (H)).   Liver Function Tests: Recent Labs  Lab 05/11/20 0544 05/12/20 0456 05/13/20 0402 05/14/20 0525  AST 68* 30 30 30   ALT 55* 37 29 21  ALKPHOS 52 48 47 44  BILITOT 0.6 0.3 0.4 0.6  PROT 7.3 7.2 7.0 6.9  ALBUMIN 3.0* 2.9* 2.8* 2.8*   Coagulation Profile: Recent Labs  Lab 05/10/20 2141 05/11/20 0544  INR 1.3* 1.3*   CBG: Recent Labs  Lab 05/17/20 0017 05/17/20 0406 05/17/20 0732 05/17/20 1111 05/17/20 1604  GLUCAP 167* 159* 115* 153* 150*   Sepsis Labs: Recent Labs  Lab 05/10/20 2141 05/10/20 2320 05/11/20 0544 05/11/20 0916  LATICACIDVEN 2.6* 2.2* 1.6 1.3    Recent Results (from the past 240 hour(s))  Resp Panel by RT-PCR (Flu A&B, Covid) Nasopharyngeal Swab     Status:  None   Collection Time: 05/09/20 11:59 PM   Specimen: Nasopharyngeal Swab; Nasopharyngeal(NP) swabs in vial transport medium  Result Value Ref Range Status   SARS Coronavirus 2 by RT PCR NEGATIVE NEGATIVE Final    Comment: (NOTE) SARS-CoV-2 target nucleic acids are NOT DETECTED.  The SARS-CoV-2 RNA is generally detectable in upper respiratory specimens during the acute phase of infection. The lowest concentration of SARS-CoV-2 viral copies this assay can detect is 138 copies/mL. A negative result does not preclude SARS-Cov-2 infection and should not be used as the  sole basis for treatment or other patient management decisions. A negative result may occur with  improper specimen collection/handling, submission of specimen other than nasopharyngeal swab, presence of viral mutation(s) within the areas targeted by this assay, and inadequate number of viral copies(<138 copies/mL). A negative result must be combined with clinical observations, patient history, and epidemiological information. The expected result is Negative.  Fact Sheet for Patients:  BloggerCourse.com  Fact Sheet for Healthcare Providers:  SeriousBroker.it  This test is no t yet approved or cleared by the [place] FDA and  has been authorized for detection and/or diagnosis of SARS-CoV-2 by FDA under an Emergency Use Authorization (EUA). This EUA will remain  in effect (meaning this test can be used) for the duration of the COVID-19 declaration under Section 564(b)(1) of the Act, 21 U.S.C.section 360bbb-3(b)(1), unless the authorization is terminated  or revoked sooner.       Influenza A by PCR NEGATIVE NEGATIVE Final   Influenza B by PCR NEGATIVE NEGATIVE Final    Comment: (NOTE) The Xpert Xpress SARS-CoV-2/FLU/RSV plus assay is intended as an aid in the diagnosis of influenza from Nasopharyngeal swab specimens and should not be used as a sole basis for treatment. Nasal washings and aspirates are unacceptable for Xpert Xpress SARS-CoV-2/FLU/RSV testing.  Fact Sheet for Patients: BloggerCourse.com  Fact Sheet for Healthcare Providers: SeriousBroker.it  This test is not yet approved or cleared by the [place] FDA and has been authorized for detection and/or diagnosis of SARS-CoV-2 by FDA under an Emergency Use Authorization (EUA). This EUA will remain in effect (meaning this test can be used) for the duration of the COVID-19 declaration under Section 564(b)(1) of the  Act, 21 U.S.C. section 360bbb-3(b)(1), unless the authorization is terminated or revoked.  Performed at [facility], [address] [place], [place] [address]   Culture, blood (routine x 2)     Status: Abnormal   Collection Time: 05/10/20  1:21 AM   Specimen: BLOOD RIGHT HAND  Result Value Ref Range Status   Specimen Description   Final    BLOOD RIGHT HAND Performed at [facility], [address] [place], [place] [address]    Special Requests   Final    BOTTLES DRAWN AEROBIC AND ANAEROBIC Blood Culture adequate volume Performed at [facility], [address], [place], [place] [address]    Culture  Setup Time   Final    GRAM POSITIVE COCCI Gram Stain Report Called to,Read Back By and Verified With: WATLINGTON,C @ 2014 ON 05/10/20 BY JUW ANAEROBIC BOTTLE ONLY GS DONE @ [facility] CRITICAL RESULT CALLED TO, READ BACK BY AND VERIFIED WITH: C. TURNER,RN 0108 05/11/2020 T. TYSOR    Culture (A)  Final    STAPHYLOCOCCUS HOMINIS THE SIGNIFICANCE OF ISOLATING THIS ORGANISM FROM A SINGLE SET OF BLOOD CULTURES WHEN MULTIPLE SETS ARE DRAWN IS UNCERTAIN. PLEASE NOTIFY THE MICROBIOLOGY DEPARTMENT WITHIN ONE WEEK IF SPECIATION AND SENSITIVITIES ARE REQUIRED. Performed at  [facility], [address] [place], [place] [address]    Report Status 05/13/2020 FINAL  Final  Blood Culture ID Panel (Reflexed)     Status: Abnormal   Collection Time: 05/10/20  1:21 AM  Result Value Ref Range Status   Enterococcus faecalis NOT DETECTED NOT DETECTED Final   Enterococcus Faecium NOT DETECTED NOT DETECTED Final   Listeria monocytogenes NOT DETECTED NOT DETECTED Final   Staphylococcus species DETECTED (A) NOT DETECTED Final    Comment: CRITICAL RESULT CALLED TO, READ BACK BY AND VERIFIED WITH: CLuiz Ochoa [id number] 05/11/2020 T. TYSOR    Staphylococcus aureus (BCID) NOT DETECTED NOT DETECTED Final   Staphylococcus epidermidis NOT DETECTED NOT DETECTED Final   Staphylococcus lugdunensis NOT DETECTED NOT  DETECTED Final   Streptococcus species NOT DETECTED NOT DETECTED Final   Streptococcus agalactiae NOT DETECTED NOT DETECTED Final   Streptococcus pneumoniae NOT DETECTED NOT DETECTED Final   Streptococcus pyogenes NOT DETECTED NOT DETECTED Final   A.calcoaceticus-baumannii NOT DETECTED NOT DETECTED Final   Bacteroides fragilis NOT DETECTED NOT DETECTED Final   Enterobacterales NOT DETECTED NOT DETECTED Final   Enterobacter cloacae complex NOT DETECTED NOT DETECTED Final   Escherichia coli NOT DETECTED NOT DETECTED Final   Klebsiella aerogenes NOT DETECTED NOT DETECTED Final   Klebsiella oxytoca NOT DETECTED NOT DETECTED Final   Klebsiella pneumoniae NOT DETECTED NOT DETECTED Final   Proteus species NOT DETECTED NOT DETECTED Final   Salmonella species NOT DETECTED NOT DETECTED Final   Serratia marcescens NOT DETECTED NOT DETECTED Final   Haemophilus influenzae NOT DETECTED NOT DETECTED Final   Neisseria meningitidis NOT DETECTED NOT DETECTED Final   Pseudomonas aeruginosa NOT DETECTED NOT DETECTED Final   Stenotrophomonas maltophilia NOT DETECTED NOT DETECTED Final   Candida albicans NOT DETECTED NOT DETECTED Final   Candida auris NOT DETECTED NOT DETECTED Final   Candida glabrata NOT DETECTED NOT DETECTED Final   Candida krusei NOT DETECTED NOT DETECTED Final   Candida parapsilosis NOT DETECTED NOT DETECTED Final   Candida tropicalis NOT DETECTED NOT DETECTED Final   Cryptococcus neoformans/gattii NOT DETECTED NOT DETECTED Final    Comment: Performed at [facility], [address] [place], [place] [address]  Culture, blood (routine x 2)     Status: Abnormal   Collection Time: 05/10/20  1:29 AM   Specimen: BLOOD LEFT HAND  Result Value Ref Range Status   Specimen Description   Final    BLOOD LEFT HAND Performed at [facility], [address] [place], [place] [address]    Special Requests   Final    BOTTLES DRAWN AEROBIC AND ANAEROBIC Blood Culture adequate  volume Performed at [facility], [address] [place], [place] [address]    Culture  Setup Time   Final    GRAM POSITIVE COCCI Gram Stain Report Called to,Read Back By and Verified With: TURNER,C @ 2333 ON 05/10/20 BY JUW IN BOTH AEROBIC AND ANAEROBIC BOTTLES GS DONE @ [facility] CRITICAL VALUE NOTED.  VALUE IS CONSISTENT WITH PREVIOUSLY REPORTED AND CALLED VALUE. Performed at [facility], [address] [place], [place] [address]    Culture STAPHYLOCOCCUS CAPITIS (A)  Final   Report Status 05/15/2020 FINAL  Final   Organism ID, Bacteria STAPHYLOCOCCUS CAPITIS  Final      Susceptibility   Staphylococcus capitis - MIC*    CIPROFLOXACIN 4 RESISTANT Resistant     ERYTHROMYCIN >=8 RESISTANT Resistant     GENTAMICIN <=0.5 SENSITIVE Sensitive  OXACILLIN <=0.25 SENSITIVE Sensitive     TETRACYCLINE >=16 RESISTANT Resistant     VANCOMYCIN <=0.5 SENSITIVE Sensitive     TRIMETH/SULFA <=10 SENSITIVE Sensitive     CLINDAMYCIN >=8 RESISTANT Resistant     RIFAMPIN <=0.5 SENSITIVE Sensitive     Inducible Clindamycin NEGATIVE Sensitive     * STAPHYLOCOCCUS CAPITIS  Urine Culture     Status: Abnormal   Collection Time: 05/10/20  9:37 AM   Specimen: Urine, Catheterized  Result Value Ref Range Status   Specimen Description   Final    URINE, CATHETERIZED Performed at [facility], [address], [place], [place] [address]    Special Requests   Final    NONE Performed at [facility], [address], [place], [place] [address]    Culture >=100,000 COLONIES/mL CITROBACTER KOSERI (A)  Final   Report Status 05/12/2020 FINAL  Final   Organism ID, Bacteria CITROBACTER KOSERI (A)  Final      Susceptibility   Citrobacter koseri - MIC*    CEFAZOLIN >=64 RESISTANT Resistant     CEFEPIME <=0.12 SENSITIVE Sensitive     CEFTRIAXONE >=64 RESISTANT Resistant     CIPROFLOXACIN >=4 RESISTANT Resistant     GENTAMICIN <=1 SENSITIVE Sensitive     IMIPENEM 0.5 SENSITIVE Sensitive     NITROFURANTOIN 64  INTERMEDIATE Intermediate     TRIMETH/SULFA <=20 SENSITIVE Sensitive     PIP/TAZO 16 SENSITIVE Sensitive     * >=100,000 COLONIES/mL CITROBACTER KOSERI  Culture, blood (Routine X 2) w Reflex to ID Panel     Status: Abnormal   Collection Time: 05/10/20  9:41 PM   Specimen: Right Antecubital; Blood  Result Value Ref Range Status   Specimen Description   Final    RIGHT ANTECUBITAL Performed at [facility], [address], [place], [place] [address]    Special Requests   Final    BOTTLES DRAWN AEROBIC AND ANAEROBIC Blood Culture adequate volume Performed at [facility], [address] [place], [place] [address]    Culture  Setup Time   Final    GRAM POSITIVE COCCI IN CLUSTERS RECOVERED FROM THE ANAEROBIC BOTTLE Gram Stain Report Called to,Read Back By and Verified With: WRIGHT,EMILY AT 1314 ON 05/12/2020 BY BAUGHAM,M. Performed at [facility] CRITICAL RESULT CALLED TO, READ BACK BY AND VERIFIED WITHCorliss Blacker RN [id number] 05/12/20 A BROWNING Performed at [facility], [address] [place], [place] [address]    Culture STAPHYLOCOCCUS CAPITIS (A)  Final   Report Status 05/14/2020 FINAL  Final   Organism ID, Bacteria STAPHYLOCOCCUS CAPITIS  Final      Susceptibility   Staphylococcus capitis - MIC*    CIPROFLOXACIN <=0.5 SENSITIVE Sensitive     ERYTHROMYCIN >=8 RESISTANT Resistant     GENTAMICIN <=0.5 SENSITIVE Sensitive     OXACILLIN SENSITIVE Sensitive     TETRACYCLINE >=16 RESISTANT Resistant     VANCOMYCIN <=0.5 SENSITIVE Sensitive     TRIMETH/SULFA <=10 SENSITIVE Sensitive     CLINDAMYCIN >=8 RESISTANT Resistant     RIFAMPIN <=0.5 SENSITIVE Sensitive     Inducible Clindamycin NEGATIVE Sensitive     * STAPHYLOCOCCUS CAPITIS  Blood Culture ID Panel (Reflexed)     Status: Abnormal   Collection Time: 05/10/20  9:41 PM  Result Value Ref Range Status   Enterococcus faecalis NOT DETECTED NOT DETECTED Final   Enterococcus Faecium NOT DETECTED NOT DETECTED Final    Listeria monocytogenes NOT DETECTED NOT DETECTED Final  Staphylococcus species DETECTED (A) NOT DETECTED Final    Comment: CRITICAL RESULT CALLED TO, READ BACK BY AND VERIFIED WITHCorliss Blacker RN [id number] 05/12/20 A BROWNING    Staphylococcus aureus (BCID) NOT DETECTED NOT DETECTED Final   Staphylococcus epidermidis NOT DETECTED NOT DETECTED Final   Staphylococcus lugdunensis NOT DETECTED NOT DETECTED Final   Streptococcus species NOT DETECTED NOT DETECTED Final   Streptococcus agalactiae NOT DETECTED NOT DETECTED Final   Streptococcus pneumoniae NOT DETECTED NOT DETECTED Final   Streptococcus pyogenes NOT DETECTED NOT DETECTED Final   A.calcoaceticus-baumannii NOT DETECTED NOT DETECTED Final   Bacteroides fragilis NOT DETECTED NOT DETECTED Final   Enterobacterales NOT DETECTED NOT DETECTED Final   Enterobacter cloacae complex NOT DETECTED NOT DETECTED Final   Escherichia coli NOT DETECTED NOT DETECTED Final   Klebsiella aerogenes NOT DETECTED NOT DETECTED Final   Klebsiella oxytoca NOT DETECTED NOT DETECTED Final   Klebsiella pneumoniae NOT DETECTED NOT DETECTED Final   Proteus species NOT DETECTED NOT DETECTED Final   Salmonella species NOT DETECTED NOT DETECTED Final   Serratia marcescens NOT DETECTED NOT DETECTED Final   Haemophilus influenzae NOT DETECTED NOT DETECTED Final   Neisseria meningitidis NOT DETECTED NOT DETECTED Final   Pseudomonas aeruginosa NOT DETECTED NOT DETECTED Final   Stenotrophomonas maltophilia NOT DETECTED NOT DETECTED Final   Candida albicans NOT DETECTED NOT DETECTED Final   Candida auris NOT DETECTED NOT DETECTED Final   Candida glabrata NOT DETECTED NOT DETECTED Final   Candida krusei NOT DETECTED NOT DETECTED Final   Candida parapsilosis NOT DETECTED NOT DETECTED Final   Candida tropicalis NOT DETECTED NOT DETECTED Final   Cryptococcus neoformans/gattii NOT DETECTED NOT DETECTED Final    Comment: Performed at [facility], [address],  [place], [place] [address]  Urine culture     Status: None   Collection Time: 05/10/20 10:02 PM   Specimen: In/Out Cath Urine  Result Value Ref Range Status   Specimen Description   Final    IN/OUT CATH URINE Performed at [facility], [address] [place], [place] [address]    Special Requests   Final    NONE Performed at [facility], [address] [place], [place] [address]    Culture   Final    NO GROWTH Performed at [facility], [address] [place], [place] [address]    Report Status 05/12/2020 FINAL  Final  Culture, blood (Routine X 2) w Reflex to ID Panel     Status: None   Collection Time: 05/10/20 10:30 PM   Specimen: Left Antecubital; Blood  Result Value Ref Range Status   Specimen Description LEFT ANTECUBITAL  Final   Special Requests   Final    BOTTLES DRAWN AEROBIC AND ANAEROBIC Blood Culture results may not be optimal due to an inadequate volume of blood received in culture bottles   Culture   Final    NO GROWTH 5 DAYS Performed at [facility], [address], [place], [place] [address]    Report Status 05/15/2020 FINAL  Final  MRSA PCR Screening     Status: Abnormal   Collection Time: 05/11/20  2:14 AM   Specimen: Nasal Mucosa; Nasopharyngeal  Result Value Ref Range Status   MRSA by PCR POSITIVE (A) NEGATIVE Final    Comment:        The GeneXpert MRSA Assay (FDA approved for NASAL specimens only), is one component of a comprehensive MRSA colonization surveillance program. It is not  intended to diagnose MRSA infection nor to guide or monitor treatment for MRSA infections. RESULT CALLED TO, READ BACK BY AND VERIFIED WITH: SINSKIE,P @ 0514 ON 05/11/20 BY JUW Performed at [facility], [address] [place], [place] [id number]      Radiology Studies: DG Abd 1 View  Result Date: 05/17/2020 CLINICAL DATA:  Ileus status post enema EXAM: ABDOMEN - 1 VIEW COMPARISON:  05/16/2020, 05/15/2020 FINDINGS: Gaseous distension of the sigmoid colon is  decreased compared to prior radiograph. Moderate volume of colonic stool, decreased. Nondilated loops of small bowel are seen. No gross free intraperitoneal air on supine view. Similar lumbar spondylosis. IMPRESSION: Interval improvement in the degree of sigmoid gaseous distension. Moderate volume of colonic stool, decreased from prior. Electronically Signed   By: Duanne GuessNicholas  Plundo D.O.   On: 05/17/2020 11:12   DG Abd 1 View  Result Date: 05/16/2020 CLINICAL DATA:  Ileus. EXAM: ABDOMEN - 1 VIEW COMPARISON:  05/15/2020.  CT 05/10/2020. FINDINGS: Persistent severe distention of the sigmoid colon noted. Sigmoid colonic distention may have progressed from prior exam. Persistent large amount of stool noted throughout the colon. No free air identified. Hemidiaphragms incompletely visualized. Degenerative change in scoliosis lumbar spine. Degenerative changes both hips. Aortoiliac atherosclerotic vascular calcification. IMPRESSION: Persistent severe distention of the sigmoid colon noted. Sigmoid colonic distention may have progressed from prior exam. Persistent large amount of stool noted throughout the colon. No free air identified. Electronically Signed   By: Maisie Fushomas  Register   On: 05/16/2020 13:47    LOS: 6 days    Time spent 30 minutes in the care of this patient  Vassie Lollarlos Shey Bartmess, MD [facility]  05/17/2020, 6:42 PM

## 2020-05-17 NOTE — Plan of Care (Signed)
  Problem: Education: Goal: Knowledge of General Education information will improve Description Including pain rating scale, medication(s)/side effects and non-pharmacologic comfort measures Outcome: Progressing   Problem: Health Behavior/Discharge Planning: Goal: Ability to manage health-related needs will improve Outcome: Progressing   

## 2020-05-17 NOTE — Progress Notes (Signed)
[name] [name], M.D. Gastroenterology & Hepatology   Interval History:  No acute events overnight. Patient had a couple of tapwater enemas yesterday and defecated a small amount of stool but persisted with abdominal distention. Patient opens his eyes and follows some commands but answers questions poorly. Spoke with the patient's healthcare proxy (brother, Dicky Boer) regarding the patient's current status and possibility of using neostigmine.  He agreed that it would be better for him to receive the neostigmine done undergoing endoscopic decompression.  He understood that there is a risk of bradycardia and cardiac arrest but he will be monitored in ICU for this purpose.  He understand that he signed papers in the past regarding his DNR status but he reports that he thought he wanted to have everything done. Abdominal xray from today is better compared to yesterday.  Inpatient Medications:  Current Facility-Administered Medications:  .  acetaminophen (TYLENOL) suppository 650 mg, 650 mg, Rectal, Q6H PRN, Adefeso, Oladapo, DO, 650 mg at 05/14/20 1529 .  dextrose 5 % with KCl 20 mEq / L  infusion, 20 mEq, Intravenous, Continuous, Barton Dubois, MD, Last Rate: 75 mL/hr at 05/17/20 0410, 20 mEq at 05/17/20 0410 .  enoxaparin (LOVENOX) injection 110 mg, 110 mg, Subcutaneous, Q12H, Erenest Blank, RPH, 110 mg at 05/16/20 2110 .  insulin aspart (novoLOG) injection 0-9 Units, 0-9 Units, Subcutaneous, Q4H, Adefeso, Oladapo, DO, 2 Units at 05/17/20 0411 .  ipratropium-albuterol (DUONEB) 0.5-2.5 (3) MG/3ML nebulizer solution 3 mL, 3 mL, Nebulization, Q6H PRN, Kc, Ramesh, MD, 3 mL at 05/14/20 0437 .  ipratropium-albuterol (DUONEB) 0.5-2.5 (3) MG/3ML nebulizer solution 3 mL, 3 mL, Nebulization, BID, Kc, Ramesh, MD, 3 mL at 05/17/20 0728 .  meropenem (MERREM) 1 g in sodium chloride 0.9 % 100 mL IVPB, 1 g, Intravenous, Q8H, Kc, Ramesh, MD, Last Rate: 200 mL/hr at 05/17/20 0531, 1 g at 05/17/20 0531 .   polyethylene glycol (MIRALAX / GLYCOLAX) packet 17 g, 17 g, Oral, Daily, Barton Dubois, MD, 17 g at 05/16/20 1438 .  Resource ThickenUp [place], , Oral, PRN, Barton Dubois, MD   I/O    Intake/Output Summary (Last 24 hours) at 05/17/2020 0813 Last data filed at 05/17/2020 0500 Gross per 24 hour  Intake 837 ml  Output 600 ml  Net 237 ml     Physical Exam: Temp:  [98 F (36.7 C)-98.6 F (37 C)] 98.4 F (36.9 C) (11/27 0537) Pulse Rate:  [90-93] 90 (11/27 0537) Resp:  [19-20] 19 (11/27 0537) BP: (91-135)/(51-83) 91/56 (11/27 0537) SpO2:  [89 %-100 %] 89 % (11/27 0728)  Temp (24hrs), Avg:98.3 F (36.8 C), Min:98 F (36.7 C), Max:98.6 F (37 C) GENERAL: The patient is awake but not conversant, in no acute distress. Bedbound. HEENT: Head is normocephalic and atraumatic. Has L sided ptosis. Mouth is well hydrated and without lesions. On [place]. NECK: Supple. No masses LUNGS: Has rhonchi in bot lung fields. HEART: RRR, normal s1 and s2. ABDOMEN: diffusely distended and tympanic but not tense. Soft, nontender, no guarding, no peritoneal signs. BS +. No masses. EXTREMITIES: Without any cyanosis, clubbing, rash, lesions or edema. SKIN: no jaundice, no rashes  Laboratory Data: CBC:     Component Value Date/Time   WBC 10.5 05/15/2020 0506   RBC 4.10 (L) 05/15/2020 0506   HGB 12.3 (L) 05/15/2020 0506   HCT 42.8 05/15/2020 0506   HCT 44.5 01/06/2017 0715   PLT 180 05/15/2020 0506   MCV 104.4 (H) 05/15/2020 0506   MCH 30.0 05/15/2020 0506  MCHC 28.7 (L) 05/15/2020 0506   RDW 13.7 05/15/2020 0506   LYMPHSABS 0.4 (L) 05/10/2020 2141   MONOABS 0.6 05/10/2020 2141   EOSABS 0.0 05/10/2020 2141   BASOSABS 0.1 05/10/2020 2141   COAG:  Lab Results  Component Value Date   INR 1.3 (H) 05/11/2020   INR 1.3 (H) 05/10/2020   INR 1.4 (H) 05/14/2019   PROTIME 24.6 (A) 09/15/2015    BMP:  BMP Latest Ref Rng & Units 05/15/2020 05/14/2020 05/13/2020  Glucose 70 - 99 mg/dL 156(H) 175(H)  137(H)  BUN 8 - 23 mg/dL 22 23 27(H)  Creatinine 0.61 - 1.24 mg/dL 1.26(H) 1.25(H) 1.29(H)  Sodium 135 - 145 mmol/L 147(H) 147(H) 150(H)  Potassium 3.5 - 5.1 mmol/L 3.2(L) 3.1(L) 3.4(L)  Chloride 98 - 111 mmol/L 105 109 108  CO2 22 - 32 mmol/L 35(H) 32 33(H)  Calcium 8.9 - 10.3 mg/dL 7.9(L) 7.8(L) 8.1(L)    HEPATIC:  Hepatic Function Latest Ref Rng & Units 05/14/2020 05/13/2020 05/12/2020  Total Protein 6.5 - 8.1 g/dL 6.9 7.0 7.2  Albumin 3.5 - 5.0 g/dL 2.8(L) 2.8(L) 2.9(L)  AST 15 - 41 U/L _0 ALT 0 - 44 U/L 21 29 37  Alk Phosphatase 38 - 126 U/L 44 47 48  Total Bilirubin 0.3 - 1.2 mg/dL 0.6 0.4 0.3  Bilirubin, Direct 0.1 - 0.5 mg/dL - - -    CARDIAC:  Lab Results  Component Value Date   CKTOTAL 122 03/24/2009   CKMB 2.0 03/24/2009   TROPONINI 0.03 ([place]) 10/22/2016      Imaging: I personally reviewed and interpreted the available labs, imaging and endoscopic files.   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  The patient has been on rectal tube decompression for the last days with very mild improvement of the colonic distention.  However, there has not been any increase in the size of the lumen and the patient has not presented any significant abdominal pain recently.    Will need to check if electrolytes are stable and with a potassium of around 4 and calcium more than 8.5 as this may worsen his Ogilvie syndrome. If his electrolytes are adequate and he does not present any improvement in his bowel movement output, neostigmine 2 mg IV push over 5 minutes in ICU monitor setting should be considered as the next step.  I spoke  with the patient's healthcare proxy (brother) regarding the ministration of this medication.  He understood the risks and agreed that this will be in the patient's best interest.   I explained to him that colonic decompression could be attempted if the bowel lumen keeps enlarging but he would like to hold of this as the procedure may also increase the risk of bowel perforation.    Will need to continue moving the patient frequently to the sides, avoiding opiates and continuing tap water enemas and Miralax .  # Ogilvie's syndrome - Check BMP today and replenish K if needed, goal >4 - Transfer patient to ICU to monitor for administration of  neostigmine 2 mg IV push over 5 minutes - have atropine at bedside and have VS and telemetry monitoring available - Tap water enema every 8 hours -  Avoid narcotics - c/w Miralax daily  [name] Peppers, MD Gastroenterology and Hepatology [facility]  Note: Occasional unusual wording and randomly placed punctuation marks may result from the use of speech recognition technology to transcribe this document

## 2020-05-18 ENCOUNTER — Inpatient Hospital Stay (HOSPITAL_COMMUNITY): Payer: Medicare Other

## 2020-05-18 DIAGNOSIS — R6520 Severe sepsis without septic shock: Secondary | ICD-10-CM | POA: Diagnosis not present

## 2020-05-18 DIAGNOSIS — A419 Sepsis, unspecified organism: Secondary | ICD-10-CM | POA: Diagnosis not present

## 2020-05-18 DIAGNOSIS — K5981 Ogilvie syndrome: Secondary | ICD-10-CM | POA: Diagnosis not present

## 2020-05-18 LAB — BASIC METABOLIC PANEL
Anion gap: 5 (ref 5–15)
BUN: 25 mg/dL — ABNORMAL HIGH (ref 8–23)
CO2: 38 mmol/L — ABNORMAL HIGH (ref 22–32)
Calcium: 8.1 mg/dL — ABNORMAL LOW (ref 8.9–10.3)
Chloride: 104 mmol/L (ref 98–111)
Creatinine, Ser: 1.17 mg/dL (ref 0.61–1.24)
GFR, Estimated: >60 mL/min (ref >60)
Glucose, Bld: 147 mg/dL — ABNORMAL HIGH (ref 70–99)
Potassium: 4.6 mmol/L (ref 3.5–5.1)
Sodium: 147 mmol/L — ABNORMAL HIGH (ref 135–145)

## 2020-05-18 LAB — CBC
HCT: 41.9 % (ref 39.0–52.0)
Hemoglobin: 11.4 g/dL — ABNORMAL LOW (ref 13.0–17.0)
MCH: 29.6 pg (ref 26.0–34.0)
MCHC: 27.2 g/dL — ABNORMAL LOW (ref 30.0–36.0)
MCV: 108.8 fL — ABNORMAL HIGH (ref 80.0–100.0)
Platelets: 219 10*3/uL (ref 150–400)
RBC: 3.85 MIL/uL — ABNORMAL LOW (ref 4.22–5.81)
RDW: 14.3 % (ref 11.5–15.5)
WBC: 10.2 10*3/uL (ref 4.0–10.5)
nRBC: 0 % (ref 0.0–0.2)

## 2020-05-18 LAB — GLUCOSE, CAPILLARY
Glucose-Capillary: 139 mg/dL — ABNORMAL HIGH (ref 70–99)
Glucose-Capillary: 132 mg/dL — ABNORMAL HIGH (ref 70–99)
Glucose-Capillary: 194 mg/dL — ABNORMAL HIGH (ref 70–99)
Glucose-Capillary: 214 mg/dL — ABNORMAL HIGH (ref 70–99)

## 2020-05-18 MED ORDER — NEOSTIGMINE METHYLSULFATE 10 MG/10ML IV SOLN
2.0000 mg | Freq: Once | INTRAVENOUS | Status: AC
  Administered 2020-05-19: 2 mg via INTRAVENOUS
  Filled 2020-05-18: qty 2

## 2020-05-18 NOTE — Plan of Care (Signed)
  Problem: Education: Goal: Knowledge of General Education information will improve Description Including pain rating scale, medication(s)/side effects and non-pharmacologic comfort measures Outcome: Progressing   

## 2020-05-18 NOTE — Progress Notes (Signed)
[name] [name], M.D. Gastroenterology & Hepatology   Interval History: No acute events overnight. Patient had improvement in his electrolytes, potassium was above 4.  Today, he is tolerating diet.  However, per nursing report he has not moved his bowels since yesterday even though he received tapwater enemas. It is difficult to obtain information from patient.  Noted to have worsening abdominal distention today compared to yesterday but no abdominal pain, nausea or vomiting. Abdominal x-ray from today showed worsening bowel distention compared to previous imaging, worse in the sigmoid colon with possible thumbprinting in the sigmoid.  Inpatient Medications:  Current Facility-Administered Medications:  .  acetaminophen (TYLENOL) suppository 650 mg, 650 mg, Rectal, Q6H PRN, Adefeso, Oladapo, DO, 650 mg at 05/14/20 1529 .  dextrose 5 % with KCl 20 mEq / L  infusion, 20 mEq, Intravenous, Continuous, Barton Dubois, MD, Last Rate: 75 mL/hr at 05/17/20 1835, 20 mEq at 05/17/20 1835 .  enoxaparin (LOVENOX) injection 110 mg, 110 mg, Subcutaneous, Q12H, Erenest Blank, RPH, 110 mg at 05/18/20 0827 .  insulin aspart (novoLOG) injection 0-9 Units, 0-9 Units, Subcutaneous, Q4H, Adefeso, Oladapo, DO, 1 Units at 05/18/20 0828 .  ipratropium-albuterol (DUONEB) 0.5-2.5 (3) MG/3ML nebulizer solution 3 mL, 3 mL, Nebulization, Q6H PRN, Kc, Ramesh, MD, 3 mL at 05/14/20 0437 .  ipratropium-albuterol (DUONEB) 0.5-2.5 (3) MG/3ML nebulizer solution 3 mL, 3 mL, Nebulization, BID, Kc, Ramesh, MD, 3 mL at 05/18/20 0805 .  meropenem (MERREM) 1 g in sodium chloride 0.9 % 100 mL IVPB, 1 g, Intravenous, Q8H, Barton Dubois, MD, Last Rate: 200 mL/hr at 05/18/20 0436, 1 g at 05/18/20 0436 .  polyethylene glycol (MIRALAX / GLYCOLAX) packet 17 g, 17 g, Oral, Daily, Barton Dubois, MD, 17 g at 05/18/20 0569 .  Resource ThickenUp [place], , Oral, PRN, Barton Dubois, MD   I/O    Intake/Output Summary (Last 24 hours) at  05/18/2020 1127 Last data filed at 05/18/2020 0500 Gross per 24 hour  Intake --  Output 500 ml  Net -500 ml     Physical Exam: Temp:  [98.1 F (36.7 C)-99 F (37.2 C)] 98.5 F (36.9 C) (11/28 0416) Pulse Rate:  [92] 92 (11/28 0416) Resp:  [18-20] 19 (11/28 0416) BP: (120-124)/(70-71) 120/70 (11/28 0416) SpO2:  [96 %-99 %] 96 % (11/28 0805)  Temp (24hrs), Avg:98.5 F (36.9 C), Min:98.1 F (36.7 C), Max:99 F (37.2 C) GENERAL: The patient isawake but not conversant, in no acute distress.Bedbound. On [place]. HEENT: Head is normocephalic and atraumatic.Has L sided ptosis.Mouth is well hydrated and without lesions. NECK: Supple. No masses LUNGS:Has rhonchi in both lung fields. HEART: RRR, normal s1 and s2. ABDOMEN:diffusely distended (worse compared to yesterday) and tympanic but not tense.Soft, nontender, no guarding, no peritoneal signs. BS +. No masses. EXTREMITIES: Without any cyanosis, clubbing, rash, lesions or edema. SKIN: no jaundice, no rashes  Laboratory Data: CBC:     Component Value Date/Time   WBC 10.2 05/18/2020 0729   RBC 3.85 (L) 05/18/2020 0729   HGB 11.4 (L) 05/18/2020 0729   HCT 41.9 05/18/2020 0729   HCT 44.5 01/06/2017 0715   PLT 219 05/18/2020 0729   MCV 108.8 (H) 05/18/2020 0729   MCH 29.6 05/18/2020 0729   MCHC 27.2 (L) 05/18/2020 0729   RDW 14.3 05/18/2020 0729   LYMPHSABS 0.4 (L) 05/10/2020 2141   MONOABS 0.6 05/10/2020 2141   EOSABS 0.0 05/10/2020 2141   BASOSABS 0.1 05/10/2020 2141   COAG:  Lab Results  Component Value Date  INR 1.3 (H) 05/11/2020   INR 1.3 (H) 05/10/2020   INR 1.4 (H) 05/14/2019   PROTIME 24.6 (A) 09/15/2015    BMP:  BMP Latest Ref Rng & Units 05/18/2020 05/15/2020 05/14/2020  Glucose 70 - 99 mg/dL 147(H) 156(H) 175(H)  BUN 8 - 23 mg/dL 25(H) 22 23  Creatinine 0.61 - 1.24 mg/dL 1.17 1.26(H) 1.25(H)  Sodium 135 - 145 mmol/L 147(H) 147(H) 147(H)  Potassium 3.5 - 5.1 mmol/L 4.6 3.2(L) 3.1(L)  Chloride 98 - 111  mmol/L 104 105 109  CO2 22 - 32 mmol/L 38(H) 35(H) 32  Calcium 8.9 - 10.3 mg/dL 8.1(L) 7.9(L) 7.8(L)    HEPATIC:  Hepatic Function Latest Ref Rng & Units 05/14/2020 05/13/2020 05/12/2020  Total Protein 6.5 - 8.1 g/dL 6.9 7.0 7.2  Albumin 3.5 - 5.0 g/dL 2.8(L) 2.8(L) 2.9(L)  AST 15 - 41 U/L _0 ALT 0 - 44 U/L 21 29 37  Alk Phosphatase 38 - 126 U/L 44 47 48  Total Bilirubin 0.3 - 1.2 mg/dL 0.6 0.4 0.3  Bilirubin, Direct 0.1 - 0.5 mg/dL - - -    CARDIAC:  Lab Results  Component Value Date   CKTOTAL 122 03/24/2009   CKMB 2.0 03/24/2009   TROPONINI 0.03 ([place]) 10/22/2016      Imaging: I personally reviewed and interpreted the available labs, imaging and endoscopic files.   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 Findings on imaging and physical exam were not consistent with volvulus but with Ogilvie'ssyndrome. The patient has been on rectal tube decompression for the last days with very mild improvement of the colonic distention.  X-rays from the last 2 days have shown worsening distention of the sigmoid colon with possible thumbprinting on x-ray from today.  Electrolytes have been adequately corrected. I previously spoke with the patient's healthcare proxy (brother) regarding the ministration of pyridostigmine. He understood the risks and agreed that this will be in the patient's best interest.   I discussed with Dr. Dyann Kief transferring the patient to ICU to administer neostigmine 2 mg slow IV push today, we will arrange for this.  If patient has improvement of his bowel distention bowel movements, no further intervention should be warranted, but will be kept n.p.o. after midnight in case he needs to have colonic decompression  tomorrow.  I have previously explained to his brother that colonic decompression could be attempted if the bowel lumen keeps enlarging but he would like to hold off this until strictly necessary as the procedure may also increase the risk of bowel perforation.   Will need to continue moving the patient frequently to the sides, avoiding opiates and continuing tap water enemas and Miralax .  # Ogilvie's syndrome - Keep K >4 and Calcium >8.5 - Transfer patient today to ICU to monitor for administration of  neostigmine2 mg IV push over 5 minutes - have atropine at bedside if HR <40 and have VS and telemetry monitoring for one hour after administration - NPO after MN for possible need of colonic decompression tomorrow if refractory to treatment - Tap water enema every 8 hours - Avoid narcotics - c/w Miralax daily  - if bradycardia <40 bpm please administer atropine rescue dose [name] Peppers, MD Gastroenterology and Hepatology [facility]  Note: Occasional unusual wording and randomly  placed punctuation marks may result from the use of speech recognition technology to transcribe this document

## 2020-05-18 NOTE — Progress Notes (Signed)
ANTICOAGULATION CONSULT NOTE -   Pharmacy Consult for Lovenox (Apixaban on hold) Indication: History of DVT/PE  Allergies  Allergen Reactions  . Penicillin G   . Penicillins     Has patient had a PCN reaction causing immediate rash, facial/tongue/throat swelling, SOB or lightheadedness with hypotension: unknown Has patient had a PCN reaction causing severe rash involving mucus membranes or skin necrosis: unknown Has patient had a PCN reaction that required hospitalization: unknown Has patient had a PCN reaction occurring within the last 10 years: unknown If all of the above answers are "NO", then may proceed with Cephalosporin use. 5/3 Tolerated Cefepime x 1 dose in ED.     Patient Measurements: Height: 5\' 8"  (172.7 cm) Weight: 111.6 kg (246 lb 0.5 oz) IBW/kg (Calculated) : 68.4  Vital Signs: Temp: 98.5 F (36.9 C) (11/28 0416) BP: 120/70 (11/28 0416) Pulse Rate: 92 (11/28 0416)  Labs: Recent Labs    05/18/20 0729  HGB 11.4*  HCT 41.9  PLT 219  CREATININE 1.17    Estimated Creatinine Clearance: 67.1 mL/min (by C-G formula based on SCr of 1.17 mg/dL).   Medical History: Past Medical History:  Diagnosis Date  . DVT (deep venous thrombosis) (HCC) 09/26/2012  . Dysphasia   . Fatty liver   . Hemiplegia (HCC)   . Hypertension   . Pneumonia   . Pulmonary embolism (HCC)   . 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(HCC)   . Stroke [facility])     Assessment: 74 y/o M with history of DVT/PE on Apixaban PTA presents to the ED with sepsis. Holding Apixaban and starting Lovenox for now. Hgb 11.4, plts WNL, renal function ok for q12h dosing. Continues to be stable.  Goal of Therapy:  Monitor platelets by anticoagulation protocol: Yes   Plan:  Continue Lovenox 1 mg/kg subcutaneous q12h F/U transition back to eliquis Daily CBC/HL Monitor for bleeding  66, PharmD, MBA, BCGP Clinical Pharmacist

## 2020-05-18 NOTE — Progress Notes (Signed)
PROGRESS NOTE    [name] [name]  XBJ:[id number] DOB: 16-Sep-1945 DOA: 05/10/2020 PCP: Sharee Holster, NP   Chief Complaint  Patient presents with  . 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 As per report: he was seen in the ED on late evening of 11/19 due to shortness of breath whereby he was requiring more supplemental oxygen and his oxygen was increased from 2 L/min to 3 L/min at the nursing facility, breathing treatment was also provided, but his O2 sats continued to be in the 80s, so he was taken to the ED for further evaluation.   In the ED, patient was suspected to have aspiration pneumonia due to his history of dysphagia and prior aspiration pneumonia, breathing treatment (albuterol and Atrovent) and small amount of white and occasionally yellow mucus were suctioned from the nares and throat, He was started on IV clindamycin.  CT scan done at that time does not reveal any specific abnormalities and patient had no leukocytosis, BNP was normal, respiratory panel was negative for influenza A, B and SARS coronavirus 2, so patient was discharged back to [facility] for a 7-day course of IV clindamycin. Blood culture drawn when patient came to the ED was positive for gram-positive cocci in one of the 2 cultures (possibly contaminated), but based on patient's clinical presentation, he was brought back to the emergency department. In the ED, he was febrile and tachycardic, LABS- leukocytosis, hypernatremia,, hyperglycemia, acute renal failure with BUN to  creatinine 41/1.65 (baseline creatinine at 1.0-1.2).  Lactic acidosis  2.6> 2.2.  UA unimpressive for UTI. CT abdomen and pelvis with contrast showed findings consistent with fecal impaction within the distal sigmoid colon with subsequent distal large bowel obstruction.  CT angiography of chest with contrast showed no definite evidence of acute PE, though still shows concern for PE.  CXR-persistent mild patchy bilateral opacities with possible superimposed acute infiltrate.  He was just tired and IV fluids, antibiotics and admitted for further management. Patient is followed by Dr. Lovell Sheehan from surgery. Being managed with supportive care and edema. With ongoing abdominal distention GI was consulted 11/22, recommended Flexi-Seal for decompression.  Subjective: No fever, no chest pain, no nausea, no vomiting.  Denies abdominal pain.  Continues to have abdominal distention with decreasing soft BM.  Code oxygen saturation on 2 L and has completed antibiotic therapy.  Assessment & Plan:  Severe sepsis POA likely multifactorial secondary to aspiration pneumonia also with large bowel obstruction: Vital signs stable, leukocytosis improving.  We will continue on empiric vancomycin/cefepime, blood cultures growing gram-positive cocci, culture demonstrated to be contaminants.  per SSLP okay to start PUREED and HTL diet.  Dilated colon possible obstruction versus ileus with constipation/history of chronic constipation ?OGILVIE SYNDROME: Appreciate surgery input, status post lactulose enema.  GI consulted due to ongoing decompression, advised Flexi-Seal for decompression and holding off on flex sigmoidoscopy decompression.  Continue on IV fluid hydration and electrolyte resuscitation.  Goal is for potassium above 4 and magnesium above 2. will follow GI service and general surgery recommendations.  Plan is for type water enema every 8 hours x3 and initiation of MiraLAX.  Continue to  watch patient's diet tolerance.   Patient abdomen has remained distended and at this time we will move him to ICU following GI recommendation for neostigmine therapy.  Recent blood culture + in aerobic bottle only - staph in BCID 05/10/20 am- f/u culture, which appears to be contaminants.  Vancomycin discontinued on 05/14/2020.  Acute on chronic hypoxic respiratory failure from combination of aspiration pneumonia/abdominal distention: At baseline 2 L, currently needing 3 L nasal cannula.  Continue to monitor respiratory status closely, continue pulmonary support and complete antibiotic therapy (plan is for a total of 8 days meropenem).    Hypernatremia: from free water deficit from his bowel obstruction and decreased oral intake.   -Continue improving -will continue D5W infusion. -Sodium remains at 147 today.  Acute renal failure,baseline creatinine at 1.0-1.2: Multifactorial from sepsis/bowel obstruction.  Continue IV fluid hydration renal function improving.  Good urine output reported.  Creatinine 1.17 currently.  Recent Labs  Lab 05/12/20 0456 05/13/20 0402 05/14/20 0525 05/15/20 0506 05/18/20 0729  BUN 36* 27* 23 22 25*  CREATININE 1.37* 1.29* 1.25* 1.26* 1.17   Hyperkalemia at 5.3 on presentation/hypokalemia -potassium is still low currently; will continue providing repletion/supplementation as needed.  Potassium and magnesium at goal currently; goal is for potassium of 4 and Mg >2.  History of recurrent DVT/PE on Eliquis at baseline.  Currently n.p.o. and so cont therapeutic Lovenox.  Hyperthyroidism on methimazole, resume once able to take p.o.  Hyperlipidemia/hypertension: Blood pressure is overall stable.  Oral meds on hold   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: Appreciate speech therapy evaluation and recommendations -Dysphagia 1 with honey thick liquids has been initiated.  Hyperglycemia due to diabetes mellitus: HbA1c is still 7.9.  Blood sugar is stable despite  being on D5W.   -continue sliding scale insulin.   Recent Labs  Lab 05/17/20 1111 05/17/20 1604 05/17/20 2347 05/18/20 0412 05/18/20 0849  GLUCAP 153* 150* 195* 139* 132*   Morbid obesity with Body mass index is 37.41 kg/m.   -Portion control discussed with patient.  GQQ:PYPPJ w/ his brother 11/27 and updated on his situation and plan for neostigmine therapy.  Patient's brother is his healthcare partner Alinda Money and in agreement with intervention. Prognosis remains guarded; patient is DNR. He is at risk of decompensation in the setting of severe sepsis, abdominal distention bowel obstruction along with hypoxic respiratory failure and aspiration. Palliative care has been consulted and appreciate input and assistance.  Nutrition: Diet Order            DIET - DYS 1 Room service appropriate? Yes; Fluid consistency: Honey Thick; Fluid restriction: 2000 mL Fluid  Diet effective now                 DVT prophylaxis: SCDs Start: 05/11/20 0142 Code Status:   Code Status: DNR  Family Communication: No family at bedside.  Status is: Inpatient. Remains inpatient appropriate because: Ongoing management of abdominal distention renal failure respiratory failure, remains on IVF,unable to take p.o.  Dispo: The patient is from: SNF              Anticipated d/c is to: SNF              Anticipated d/c date is: > 3 days              Patient currently is not medically stable to d/c.   Consultants:GI  Procedures:see below for x-ray reports.  Culture/Microbiology    Component Value Date/Time   SDES  LEFT ANTECUBITAL 05/10/2020 2230   SPECREQUEST  05/10/2020 2230    BOTTLES DRAWN AEROBIC AND ANAEROBIC Blood Culture results may not be optimal due to an inadequate volume of blood received in culture bottles   CULT  05/10/2020 2230    NO GROWTH 5 DAYS Performed at [facility], [address], [place], [place] [address]    REPTSTATUS 05/15/2020 FINAL 05/10/2020 2230    Other culture-see  note  Medications: Scheduled Meds: . enoxaparin (LOVENOX) injection  110 mg Subcutaneous Q12H  . insulin aspart  0-9 Units Subcutaneous Q4H  . ipratropium-albuterol  3 mL Nebulization BID  . polyethylene glycol  17 g Oral Daily   Continuous Infusions: . dextrose 5 % with KCl 20 mEq / L 20 mEq (05/17/20 1835)  . meropenem (MERREM) IV 1 g (05/18/20 0436)    Antimicrobials: Anti-infectives (From admission, onward)   Start     Dose/Rate Route Frequency Ordered Stop   05/13/20 0900  meropenem (MERREM) 1 g in sodium chloride 0.9 % 100 mL IVPB        1 g 200 mL/hr over 30 Minutes Intravenous Every 8 hours 05/13/20 0808 05/20/20 0559   05/11/20 2200  vancomycin (VANCOREADY) IVPB 1250 mg/250 mL  Status:  Discontinued        1,250 mg 166.7 mL/hr over 90 Minutes Intravenous Every 24 hours 05/11/20 0316 05/14/20 1016   05/11/20 1000  ceFEPIme (MAXIPIME) 2 g in sodium chloride 0.9 % 100 mL IVPB  Status:  Discontinued        2 g 200 mL/hr over 30 Minutes Intravenous Every 12 hours 05/11/20 0316 05/13/20 0808   05/11/20 0300  metroNIDAZOLE (FLAGYL) IVPB 500 mg  Status:  Discontinued        500 mg 100 mL/hr over 60 Minutes Intravenous Every 8 hours 05/11/20 0214 05/13/20 0808   05/10/20 2215  aztreonam (AZACTAM) 2 g in sodium chloride 0.9 % 100 mL IVPB  Status:  Discontinued        2 g 200 mL/hr over 30 Minutes Intravenous  Once 05/10/20 2205 05/10/20 2211   05/10/20 2215  vancomycin (VANCOCIN) IVPB 1000 mg/200 mL premix  Status:  Discontinued        1,000 mg 200 mL/hr over 60 Minutes Intravenous  Once 05/10/20 2205 05/10/20 2207   05/10/20 2215  vancomycin (VANCOREADY) IVPB 2000 mg/400 mL        2,000 mg 200 mL/hr over 120 Minutes Intravenous  Once 05/10/20 2207 05/11/20 0050   05/10/20 2215  ceFEPIme (MAXIPIME) 2 g in sodium chloride 0.9 % 100 mL IVPB        2 g 200 mL/hr over 30 Minutes Intravenous  Once 05/10/20 2211 05/10/20 2328     Objective:  Today's Vitals   05/17/20 2001  05/17/20 2100 05/18/20 0416 05/18/20 0805  BP: 124/70  120/70   Pulse: 92  92   Resp: 20  19   Temp: 99 F (37.2 C)  98.5 F (36.9 C)   TempSrc:      SpO2: 99%  99% 96%  Weight:      Height:      PainSc:  0-No pain      Intake/Output Summary (Last 24 hours) at 05/18/2020 1036 Last data filed at 05/18/2020 0500 Gross per 24 hour  Intake --  Output 500 ml  Net -500 ml   Filed Weights   05/10/20 2122  Weight: 111.6 kg   Weight change:    Intake/Output from previous day:  11/27 0701 - 11/28 0700 In: -  Out: 500 [Urine:500]   Intake/Output this shift: No intake/output data recorded.  Examination: General exam: Alert, awake, oriented x 2; continue to have some gurgling sound and diffuse rhonchi bilaterally.  Abdomen more distended and struggling to consistently move his bowels.  No nausea vomiting.  Patient is afebrile using 2 L nasal cannula supplementation. Respiratory system: Diffuse rhonchi bilaterally; no wheezing.  No using accessory muscles.  Nasal cannula supplementation in place (back to his baseline of chronic supplementation, 2-3 L). Cardiovascular system:RRR. No murmurs, rubs, gallops.  Unable to properly assess JVD with body habitus. Gastrointestinal system: Abdomen is significantly distended and tense; decreased bowel sounds appreciated.  Patient expressed no tenderness on palpation.   Central nervous system: No new focal neurological deficits. Extremities: No cyanosis or clubbing; Prevalon boots in place. Skin: No rashes, no petechiae. Psychiatry: Mood & affect appropriate.    Data Reviewed: I have personally reviewed following labs and imaging studies.  CBC: Recent Labs  Lab 05/12/20 0456 05/13/20 0402 05/14/20 0525 05/15/20 0506 05/18/20 0729  WBC 15.7* 12.3* 9.9 10.5 10.2  HGB 11.5* 11.9* 12.3* 12.3* 11.4*  HCT 41.2 40.2 42.1 42.8 41.9  MCV 107.9* 103.9* 102.7* 104.4* 108.8*  PLT 197 203 191 180 219   Basic Metabolic Panel: Recent Labs  Lab  05/12/20 0456 05/13/20 0402 05/14/20 0525 05/15/20 0506 05/17/20 0738 05/18/20 0729  NA 151* 150* 147* 147*  --  147*  K 3.9 3.4* 3.1* 3.2*  --  4.6  CL 109 108 109 105  --  104  CO2 34* 33* 32 35*  --  38*  GLUCOSE 114* 137* 175* 156*  --  147*  BUN 36* 27* 23 22  --  25*  CREATININE 1.37* 1.29* 1.25* 1.26*  --  1.17  CALCIUM 8.3* 8.1* 7.8* 7.9*  --  8.1*  MG  --   --   --   --  2.5*  --    GFR: Estimated Creatinine Clearance: 67.1 mL/min (by C-G formula based on SCr of 1.17 mg/dL).   Liver Function Tests: Recent Labs  Lab 05/12/20 0456 05/13/20 0402 05/14/20 0525  AST 30 30 30   ALT 37 29 21  ALKPHOS 48 47 44  BILITOT 0.3 0.4 0.6  PROT 7.2 7.0 6.9  ALBUMIN 2.9* 2.8* 2.8*   CBG: Recent Labs  Lab 05/17/20 1111 05/17/20 1604 05/17/20 2347 05/18/20 0412 05/18/20 0849  GLUCAP 153* 150* 195* 139* 132*   Sepsis Labs: No results for input(s): PROCALCITON, LATICACIDVEN in the last 168 hours.  Recent Results (from the past 240 hour(s))  Resp Panel by RT-PCR (Flu A&B, Covid) Nasopharyngeal Swab     Status: None   Collection Time: 05/09/20 11:59 PM   Specimen: Nasopharyngeal Swab; Nasopharyngeal(NP) swabs in vial transport medium  Result Value Ref Range Status   SARS Coronavirus 2 by RT PCR NEGATIVE NEGATIVE Final    Comment: (NOTE) SARS-CoV-2 target nucleic acids are NOT DETECTED.  The SARS-CoV-2 RNA is generally detectable in upper respiratory specimens during the acute phase of infection. The lowest concentration of SARS-CoV-2 viral copies this assay can detect is 138 copies/mL. A negative result does not preclude SARS-Cov-2 infection and should not be used as the sole basis for treatment or other patient management decisions. A negative result may occur with  improper specimen collection/handling, submission of specimen other than nasopharyngeal swab, presence of viral mutation(s) within the areas targeted by this assay, and inadequate number of  viral  copies(<138 copies/mL). A negative result must be combined with clinical observations, patient history, and epidemiological information. The expected result is Negative.  Fact Sheet for Patients:  BloggerCourse.com  Fact Sheet for Healthcare Providers:  SeriousBroker.it  This test is no t yet approved or cleared by the [place] FDA and  has been authorized for detection and/or diagnosis of SARS-CoV-2 by FDA under an Emergency Use Authorization (EUA). This EUA will remain  in effect (meaning this test can be used) for the duration of the COVID-19 declaration under Section 564(b)(1) of the Act, 21 U.S.C.section 360bbb-3(b)(1), unless the authorization is terminated  or revoked sooner.       Influenza A by PCR NEGATIVE NEGATIVE Final   Influenza B by PCR NEGATIVE NEGATIVE Final    Comment: (NOTE) The Xpert Xpress SARS-CoV-2/FLU/RSV plus assay is intended as an aid in the diagnosis of influenza from Nasopharyngeal swab specimens and should not be used as a sole basis for treatment. Nasal washings and aspirates are unacceptable for Xpert Xpress SARS-CoV-2/FLU/RSV testing.  Fact Sheet for Patients: BloggerCourse.com  Fact Sheet for Healthcare Providers: SeriousBroker.it  This test is not yet approved or cleared by the [place] FDA and has been authorized for detection and/or diagnosis of SARS-CoV-2 by FDA under an Emergency Use Authorization (EUA). This EUA will remain in effect (meaning this test can be used) for the duration of the COVID-19 declaration under Section 564(b)(1) of the Act, 21 U.S.C. section 360bbb-3(b)(1), unless the authorization is terminated or revoked.  Performed at [facility], [address], [place], [place] [address]   Culture, blood (routine x 2)     Status: Abnormal   Collection Time: 05/10/20  1:21 AM   Specimen: BLOOD RIGHT HAND   Result Value Ref Range Status   Specimen Description   Final    BLOOD RIGHT HAND Performed at [facility], [address], [place], [place] [address]    Special Requests   Final    BOTTLES DRAWN AEROBIC AND ANAEROBIC Blood Culture adequate volume Performed at [facility], [address] [place], [place] [address]    Culture  Setup Time   Final    GRAM POSITIVE COCCI Gram Stain Report Called to,Read Back By and Verified With: WATLINGTON,C @ 2014 ON 05/10/20 BY JUW ANAEROBIC BOTTLE ONLY GS DONE @ [facility] CRITICAL RESULT CALLED TO, READ BACK BY AND VERIFIED WITH: C. TURNER,RN 0108 05/11/2020 T. TYSOR    Culture (A)  Final    STAPHYLOCOCCUS HOMINIS THE SIGNIFICANCE OF ISOLATING THIS ORGANISM FROM A SINGLE SET OF BLOOD CULTURES WHEN MULTIPLE SETS ARE DRAWN IS UNCERTAIN. PLEASE NOTIFY THE MICROBIOLOGY DEPARTMENT WITHIN ONE WEEK IF SPECIATION AND SENSITIVITIES ARE REQUIRED. Performed at [facility], [address], [place], [place] [address]    Report Status 05/13/2020 FINAL  Final  Blood Culture ID Panel (Reflexed)     Status: Abnormal   Collection Time: 05/10/20  1:21 AM  Result Value Ref Range Status   Enterococcus faecalis NOT DETECTED NOT DETECTED Final   Enterococcus Faecium NOT DETECTED NOT DETECTED Final   Listeria monocytogenes NOT DETECTED NOT DETECTED Final   Staphylococcus species DETECTED (A) NOT DETECTED Final    Comment: CRITICAL RESULT CALLED TO, READ BACK BY AND VERIFIED WITH: CLuiz Ochoa 0932 05/11/2020 T. TYSOR    Staphylococcus aureus (BCID) NOT DETECTED NOT DETECTED Final   Staphylococcus epidermidis NOT DETECTED NOT DETECTED Final   Staphylococcus lugdunensis NOT DETECTED NOT DETECTED Final   Streptococcus species NOT DETECTED  NOT DETECTED Final   Streptococcus agalactiae NOT DETECTED NOT DETECTED Final   Streptococcus pneumoniae NOT DETECTED NOT DETECTED Final   Streptococcus pyogenes NOT DETECTED NOT DETECTED Final   A.calcoaceticus-baumannii NOT  DETECTED NOT DETECTED Final   Bacteroides fragilis NOT DETECTED NOT DETECTED Final   Enterobacterales NOT DETECTED NOT DETECTED Final   Enterobacter cloacae complex NOT DETECTED NOT DETECTED Final   Escherichia coli NOT DETECTED NOT DETECTED Final   Klebsiella aerogenes NOT DETECTED NOT DETECTED Final   Klebsiella oxytoca NOT DETECTED NOT DETECTED Final   Klebsiella pneumoniae NOT DETECTED NOT DETECTED Final   Proteus species NOT DETECTED NOT DETECTED Final   Salmonella species NOT DETECTED NOT DETECTED Final   Serratia marcescens NOT DETECTED NOT DETECTED Final   Haemophilus influenzae NOT DETECTED NOT DETECTED Final   Neisseria meningitidis NOT DETECTED NOT DETECTED Final   Pseudomonas aeruginosa NOT DETECTED NOT DETECTED Final   Stenotrophomonas maltophilia NOT DETECTED NOT DETECTED Final   Candida albicans NOT DETECTED NOT DETECTED Final   Candida auris NOT DETECTED NOT DETECTED Final   Candida glabrata NOT DETECTED NOT DETECTED Final   Candida krusei NOT DETECTED NOT DETECTED Final   Candida parapsilosis NOT DETECTED NOT DETECTED Final   Candida tropicalis NOT DETECTED NOT DETECTED Final   Cryptococcus neoformans/gattii NOT DETECTED NOT DETECTED Final    Comment: Performed at [facility], [address], [place], [place] [address]  Culture, blood (routine x 2)     Status: Abnormal   Collection Time: 05/10/20  1:29 AM   Specimen: BLOOD LEFT HAND  Result Value Ref Range Status   Specimen Description   Final    BLOOD LEFT HAND Performed at [facility], [address], [place], [place] [address]    Special Requests   Final    BOTTLES DRAWN AEROBIC AND ANAEROBIC Blood Culture adequate volume Performed at [facility], [address], [place], [place] [address]    Culture  Setup Time   Final    GRAM POSITIVE COCCI Gram Stain Report Called to,Read Back By and Verified With: TURNER,C @ 2333 ON 05/10/20 BY JUW IN BOTH AEROBIC AND ANAEROBIC BOTTLES GS DONE @ [facility] CRITICAL  VALUE NOTED.  VALUE IS CONSISTENT WITH PREVIOUSLY REPORTED AND CALLED VALUE. Performed at [facility], [address], [place], [place] [address]    Culture STAPHYLOCOCCUS CAPITIS (A)  Final   Report Status 05/15/2020 FINAL  Final   Organism ID, Bacteria STAPHYLOCOCCUS CAPITIS  Final      Susceptibility   Staphylococcus capitis - MIC*    CIPROFLOXACIN 4 RESISTANT Resistant     ERYTHROMYCIN >=8 RESISTANT Resistant     GENTAMICIN <=0.5 SENSITIVE Sensitive     OXACILLIN <=0.25 SENSITIVE Sensitive     TETRACYCLINE >=16 RESISTANT Resistant     VANCOMYCIN <=0.5 SENSITIVE Sensitive     TRIMETH/SULFA <=10 SENSITIVE Sensitive     CLINDAMYCIN >=8 RESISTANT Resistant     RIFAMPIN <=0.5 SENSITIVE Sensitive     Inducible Clindamycin NEGATIVE Sensitive     * STAPHYLOCOCCUS CAPITIS  Urine Culture     Status: Abnormal   Collection Time: 05/10/20  9:37 AM   Specimen: Urine, Catheterized  Result Value Ref Range Status   Specimen Description   Final    URINE, CATHETERIZED Performed at [facility], [address] [place], [place] [address]    Special Requests   Final    NONE Performed at [facility], [address], [place], [place] [address]  Culture >=100,000 COLONIES/mL CITROBACTER KOSERI (A)  Final   Report Status 05/12/2020 FINAL  Final   Organism ID, Bacteria CITROBACTER KOSERI (A)  Final      Susceptibility   Citrobacter koseri - MIC*    CEFAZOLIN >=64 RESISTANT Resistant     CEFEPIME <=0.12 SENSITIVE Sensitive     CEFTRIAXONE >=64 RESISTANT Resistant     CIPROFLOXACIN >=4 RESISTANT Resistant     GENTAMICIN <=1 SENSITIVE Sensitive     IMIPENEM 0.5 SENSITIVE Sensitive     NITROFURANTOIN 64 INTERMEDIATE Intermediate     TRIMETH/SULFA <=20 SENSITIVE Sensitive     PIP/TAZO 16 SENSITIVE Sensitive     * >=100,000 COLONIES/mL CITROBACTER KOSERI  Culture, blood (Routine X 2) w Reflex to ID Panel     Status: Abnormal   Collection Time: 05/10/20  9:41 PM   Specimen: Right  Antecubital; Blood  Result Value Ref Range Status   Specimen Description   Final    RIGHT ANTECUBITAL Performed at [facility], [address] [place], [place] [address]    Special Requests   Final    BOTTLES DRAWN AEROBIC AND ANAEROBIC Blood Culture adequate volume Performed at [facility], [address] [place], [place] [address]    Culture  Setup Time   Final    GRAM POSITIVE COCCI IN CLUSTERS RECOVERED FROM THE ANAEROBIC BOTTLE Gram Stain Report Called to,Read Back By and Verified With: WRIGHT,EMILY AT 1314 ON 05/12/2020 BY BAUGHAM,M. Performed at [facility] CRITICAL RESULT CALLED TO, READ BACK BY AND VERIFIED WITHCorliss Blacker RN [id number] 05/12/20 A BROWNING Performed at [facility], [address] [place], [place] [address]    Culture STAPHYLOCOCCUS CAPITIS (A)  Final   Report Status 05/14/2020 FINAL  Final   Organism ID, Bacteria STAPHYLOCOCCUS CAPITIS  Final      Susceptibility   Staphylococcus capitis - MIC*    CIPROFLOXACIN <=0.5 SENSITIVE Sensitive     ERYTHROMYCIN >=8 RESISTANT Resistant     GENTAMICIN <=0.5 SENSITIVE Sensitive     OXACILLIN SENSITIVE Sensitive     TETRACYCLINE >=16 RESISTANT Resistant     VANCOMYCIN <=0.5 SENSITIVE Sensitive     TRIMETH/SULFA <=10 SENSITIVE Sensitive     CLINDAMYCIN >=8 RESISTANT Resistant     RIFAMPIN <=0.5 SENSITIVE Sensitive     Inducible Clindamycin NEGATIVE Sensitive     * STAPHYLOCOCCUS CAPITIS  Blood Culture ID Panel (Reflexed)     Status: Abnormal   Collection Time: 05/10/20  9:41 PM  Result Value Ref Range Status   Enterococcus faecalis NOT DETECTED NOT DETECTED Final   Enterococcus Faecium NOT DETECTED NOT DETECTED Final   Listeria monocytogenes NOT DETECTED NOT DETECTED Final   Staphylococcus species DETECTED (A) NOT DETECTED Final    Comment: CRITICAL RESULT CALLED TO, READ BACK BY AND VERIFIED WITHCorliss Blacker RN [id number] 05/12/20 A BROWNING    Staphylococcus aureus (BCID) NOT DETECTED NOT DETECTED Final    Staphylococcus epidermidis NOT DETECTED NOT DETECTED Final   Staphylococcus lugdunensis NOT DETECTED NOT DETECTED Final   Streptococcus species NOT DETECTED NOT DETECTED Final   Streptococcus agalactiae NOT DETECTED NOT DETECTED Final   Streptococcus pneumoniae NOT DETECTED NOT DETECTED Final   Streptococcus pyogenes NOT DETECTED NOT DETECTED Final   A.calcoaceticus-baumannii NOT DETECTED NOT DETECTED Final   Bacteroides fragilis NOT DETECTED NOT DETECTED Final   Enterobacterales NOT DETECTED NOT DETECTED Final   Enterobacter cloacae complex NOT DETECTED NOT DETECTED Final   Escherichia coli NOT DETECTED NOT DETECTED  Final   Klebsiella aerogenes NOT DETECTED NOT DETECTED Final   Klebsiella oxytoca NOT DETECTED NOT DETECTED Final   Klebsiella pneumoniae NOT DETECTED NOT DETECTED Final   Proteus species NOT DETECTED NOT DETECTED Final   Salmonella species NOT DETECTED NOT DETECTED Final   Serratia marcescens NOT DETECTED NOT DETECTED Final   Haemophilus influenzae NOT DETECTED NOT DETECTED Final   Neisseria meningitidis NOT DETECTED NOT DETECTED Final   Pseudomonas aeruginosa NOT DETECTED NOT DETECTED Final   Stenotrophomonas maltophilia NOT DETECTED NOT DETECTED Final   Candida albicans NOT DETECTED NOT DETECTED Final   Candida auris NOT DETECTED NOT DETECTED Final   Candida glabrata NOT DETECTED NOT DETECTED Final   Candida krusei NOT DETECTED NOT DETECTED Final   Candida parapsilosis NOT DETECTED NOT DETECTED Final   Candida tropicalis NOT DETECTED NOT DETECTED Final   Cryptococcus neoformans/gattii NOT DETECTED NOT DETECTED Final    Comment: Performed at [facility], [address] [place], [place] [address]  Urine culture     Status: None   Collection Time: 05/10/20 10:02 PM   Specimen: In/Out Cath Urine  Result Value Ref Range Status   Specimen Description   Final    IN/OUT CATH URINE Performed at [facility], [address] [place], [place] [address]    Special  Requests   Final    NONE Performed at [facility], [address] [place], [place] [address]    Culture   Final    NO GROWTH Performed at [facility], [address] [place], [place] [address]    Report Status 05/12/2020 FINAL  Final  Culture, blood (Routine X 2) w Reflex to ID Panel     Status: None   Collection Time: 05/10/20 10:30 PM   Specimen: Left Antecubital; Blood  Result Value Ref Range Status   Specimen Description LEFT ANTECUBITAL  Final   Special Requests   Final    BOTTLES DRAWN AEROBIC AND ANAEROBIC Blood Culture results may not be optimal due to an inadequate volume of blood received in culture bottles   Culture   Final    NO GROWTH 5 DAYS Performed at [facility], [address] [place], [place] [address]    Report Status 05/15/2020 FINAL  Final  MRSA PCR Screening     Status: Abnormal   Collection Time: 05/11/20  2:14 AM   Specimen: Nasal Mucosa; Nasopharyngeal  Result Value Ref Range Status   MRSA by PCR POSITIVE (A) NEGATIVE Final    Comment:        The GeneXpert MRSA Assay (FDA approved for NASAL specimens only), is one component of a comprehensive MRSA colonization surveillance program. It is not intended to diagnose MRSA infection nor to guide or monitor treatment for MRSA infections. RESULT CALLED TO, READ BACK BY AND VERIFIED WITH: SINSKIE,P @ 0514 ON 05/11/20 BY JUW Performed at [facility], [address] [place], [place] [address]      Radiology Studies: DG Abd 1 View  Result Date: 05/17/2020 CLINICAL DATA:  Ileus status post enema EXAM: ABDOMEN - 1 VIEW COMPARISON:  05/16/2020, 05/15/2020 FINDINGS: Gaseous distension of the sigmoid colon is decreased compared to prior radiograph. Moderate volume of colonic stool, decreased. Nondilated loops of small bowel are seen. No gross free intraperitoneal air on supine view. Similar lumbar spondylosis. IMPRESSION: Interval improvement in the degree of sigmoid gaseous distension. Moderate  volume of colonic stool, decreased from prior. Electronically Signed   By: Duanne GuessNicholas  Plundo  D.O.   On: 05/17/2020 11:12   DG Abd 1 View  Result Date: 05/16/2020 CLINICAL DATA:  Ileus. EXAM: ABDOMEN - 1 VIEW COMPARISON:  05/15/2020.  CT 05/10/2020. FINDINGS: Persistent severe distention of the sigmoid colon noted. Sigmoid colonic distention may have progressed from prior exam. Persistent large amount of stool noted throughout the colon. No free air identified. Hemidiaphragms incompletely visualized. Degenerative change in scoliosis lumbar spine. Degenerative changes both hips. Aortoiliac atherosclerotic vascular calcification. IMPRESSION: Persistent severe distention of the sigmoid colon noted. Sigmoid colonic distention may have progressed from prior exam. Persistent large amount of stool noted throughout the colon. No free air identified. Electronically Signed   By: Maisie Fus  Register   On: 05/16/2020 13:47    LOS: 7 days    Time spent 30 minutes in the care of this patient  Vassie Loll, MD [facility]  05/18/2020, 10:36 AM

## 2020-05-18 NOTE — Progress Notes (Signed)
Spoke with Dr. Levon Hedger about IV push neostigmine that had been due at noon today. Pt had not been transferred, and pt's current RN and the charge RN are ICU nurses. Received verbal confirmation that it is ok to administer neostigmine without transferring pt to ICU so long as pt remains on tele and will be hooked up to AED for closer monitoring. RN confirmed and will remain with pt for 60 min post med admin. If pt experiences negative side effects and needs ICU bed post neostigmine, pt will be transferred. ICU charge and [facility] aware of plan.   Central tele has been called and strip has been run pre-admin (see tele documentation), rescue atropine and liter of NS at bedside if needed.

## 2020-05-19 ENCOUNTER — Inpatient Hospital Stay (HOSPITAL_COMMUNITY): Payer: Medicare Other

## 2020-05-19 LAB — GLUCOSE, CAPILLARY
Glucose-Capillary: 171 mg/dL — ABNORMAL HIGH (ref 70–99)
Glucose-Capillary: 165 mg/dL — ABNORMAL HIGH (ref 70–99)
Glucose-Capillary: 121 mg/dL — ABNORMAL HIGH (ref 70–99)
Glucose-Capillary: 137 mg/dL — ABNORMAL HIGH (ref 70–99)
Glucose-Capillary: 170 mg/dL — ABNORMAL HIGH (ref 70–99)
Glucose-Capillary: 188 mg/dL — ABNORMAL HIGH (ref 70–99)
Glucose-Capillary: 116 mg/dL — ABNORMAL HIGH (ref 70–99)

## 2020-05-19 LAB — BASIC METABOLIC PANEL
Anion gap: 4 — ABNORMAL LOW (ref 5–15)
BUN: 23 mg/dL (ref 8–23)
CO2: 38 mmol/L — ABNORMAL HIGH (ref 22–32)
Calcium: 8.2 mg/dL — ABNORMAL LOW (ref 8.9–10.3)
Chloride: 101 mmol/L (ref 98–111)
Creatinine, Ser: 1.13 mg/dL (ref 0.61–1.24)
GFR, Estimated: >60 mL/min (ref >60)
Glucose, Bld: 183 mg/dL — ABNORMAL HIGH (ref 70–99)
Potassium: 4.4 mmol/L (ref 3.5–5.1)
Sodium: 143 mmol/L (ref 135–145)

## 2020-05-19 MED ORDER — GLUCERNA SHAKE PO LIQD
237.0000 mL | Freq: Three times a day (TID) | ORAL | Status: DC
  Administered 2020-05-20 – 2020-05-21 (×2): 237 mL via ORAL

## 2020-05-19 MED ORDER — POTASSIUM CL IN DEXTROSE 5% 20 MEQ/L IV SOLN
20.0000 meq | INTRAVENOUS | Status: DC
  Administered 2020-05-19 – 2020-05-20 (×2): 20 meq via INTRAVENOUS

## 2020-05-19 MED ORDER — POLYETHYLENE GLYCOL 3350 17 G PO PACK
17.0000 g | PACK | Freq: Two times a day (BID) | ORAL | Status: DC
  Administered 2020-05-19 – 2020-05-20 (×2): 17 g via ORAL
  Filled 2020-05-19 (×3): qty 1

## 2020-05-19 MED ORDER — BISACODYL 10 MG RE SUPP
10.0000 mg | Freq: Every day | RECTAL | Status: DC
  Administered 2020-05-19 – 2020-05-21 (×3): 10 mg via RECTAL
  Filled 2020-05-19 (×3): qty 1

## 2020-05-19 NOTE — Progress Notes (Signed)
Attempted to give patient drink and patient refused, Took one sip and stated "I dont want anymore". Patient left sitting upright with call bell in reach.

## 2020-05-19 NOTE — Progress Notes (Addendum)
Subjective: Resting in bed, no distress. Rectal tube no longer in place. No BM today per nursing.   Objective: Vital signs in last 24 hours: Temp:  [98.1 F (36.7 C)-99.5 F (37.5 C)] 99.5 F (37.5 C) (11/29 0403) Pulse Rate:  [53-96] 96 (11/29 0403) Resp:  [20] 20 (11/29 0403) BP: (99-135)/(53-71) 124/55 (11/29 0403) SpO2:  [95 %-100 %] 97 % (11/29 0745) Last BM Date: 05/17/20 General:   Alert, unable to assess orientation, dysarthric speech Head:  Normocephalic and atraumatic. Lungs: coarse, scattered rhonchi Abdomen:  Bowel sounds present, largely distended but non-rigid, soft, protruding umbilical hernia, no TTP Neurologic:  Unable to assess orientation  Intake/Output from previous day: 11/28 0701 - 11/29 0700 In: 360 [P.O.:360] Out: 1350 [Urine:1350] Intake/Output this shift: No intake/output data recorded.  Lab Results: Recent Labs    05/18/20 0729  WBC 10.2  HGB 11.4*  HCT 41.9  PLT 219   BMET Recent Labs    05/18/20 0729 05/19/20 0620  NA 147* 143  K 4.6 4.4  CL 104 101  CO2 38* 38*  GLUCOSE 147* 183*  BUN 25* 23  CREATININE 1.17 1.13  CALCIUM 8.1* 8.2*     Studies/Results: DG Abd 1 View  Result Date: 05/18/2020 CLINICAL DATA:  Ileus EXAM: ABDOMEN - 1 VIEW COMPARISON:  05/17/2020 FINDINGS: Prominent gaseous distension of the sigmoid colon is similar to slightly progressed compared to the prior radiograph. Moderate volume of stool persist throughout the colon. Nondilated loops of small bowel. No gross free intraperitoneal air on supine imaging. Similar lumbar spondylosis. IMPRESSION: Prominent gaseous distension of the sigmoid colon is similar to slightly progressed compared to the prior radiograph. Moderate volume of stool persist throughout the colon. Electronically Signed   By: Duanne Guess D.O.   On: 05/18/2020 11:15   DG Abd Portable 2V  Result Date: 05/19/2020 CLINICAL DATA:  ABD distention. Limited Hx due to Pts difficulty with  speech post stroke. Hx HTN, stroke, peg tube, nonsmoker EXAM: PORTABLE ABDOMEN - 2 VIEW COMPARISON:  05/18/2020 and earlier exams.  CT, 05/10/2020. FINDINGS: Distended sigmoid colon with moderate increased stool. This is similar to the prior exam. No other bowel dilation. IMPRESSION: 1. No significant change the previous day's study. Persistent sigmoid colon distention with moderate increased stool. No convincing bowel obstruction. Electronically Signed   By: Amie Portland M.D.   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   Due to worsening distension of colon, neostigmine was administered overnight (0034) with patient tolerating well. Per notes, brother desires to hold off on colonic decompression unless absolutely necessary. Rectal tube no longer in place. Clinically, still with distended abdomen but no distress; abdominal xray has been ordered for this morning.   Plan: Frequent turning, tap water enema every 8 hours Miralax daily Continue electrolyte replacements as needed Abdominal xray today May need colonic decompression NPO for now until review of xray Further recommendations to follow  Gelene Mink, PhD, ANP-BC [facility]     LOS: 8 days    05/19/2020, 12:30 PM   Addendum: xray without significant change from prior day. We  need to maximize patient's bowel regimen, as he is not having ideal output and still with increased stool in colon.  As previously noted, brother desires to hold off on endoscopic decompression unless  absolutely necessary. As we still have not maximized bowel regimen, will increase Miralax to BID, add dulcolax suppository each evening. Continue tap water enemas for now. Resume dysphagia 1 diet.   Gelene Mink, PhD, ANP-BC [facility]

## 2020-05-19 NOTE — Progress Notes (Signed)
Neostigmine pushed at 0034, pt alert, speaking throughout administration. HR in 90s at beginning of admin, lowest HR=45, sustained mostly in 70s 10 minutes post administration. Per Central Monitoring no blocks noted during administration and post admin. Pt noted to be coughing times when HR dropped into 40s, possible drop could have been caused by vagle response vs meds. Pt remains alert and appears comfortable, current HR=92, will continue to monitor.

## 2020-05-19 NOTE — Progress Notes (Signed)
PROGRESS NOTE    [name] [name]  KDX:[id number] DOB: 1945-12-23 DOA: 05/10/2020 PCP: Sharee Holster, NP   Chief Complaint  Patient presents with  . 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 As per report: he was seen in the ED on late evening of 11/19 due to shortness of breath whereby he was requiring more supplemental oxygen and his oxygen was increased from 2 L/min to 3 L/min at the nursing facility, breathing treatment was also provided, but his O2 sats continued to be in the 80s, so he was taken to the ED for further evaluation.   In the ED, patient was suspected to have aspiration pneumonia due to his history of dysphagia and prior aspiration pneumonia, breathing treatment (albuterol and Atrovent) and small amount of white and occasionally yellow mucus were suctioned from the nares and throat, He was started on IV clindamycin.  CT scan done at that time does not reveal any specific abnormalities and patient had no leukocytosis, BNP was normal, respiratory panel was negative for influenza A, B and SARS coronavirus 2, so patient was discharged back to [facility] for a 7-day course of IV clindamycin. Blood culture drawn when patient came to the ED was positive for gram-positive cocci in one of the 2 cultures (possibly contaminated), but based on patient's clinical presentation, he was brought back to the emergency department. In the ED, he was febrile and tachycardic, LABS- leukocytosis, hypernatremia,, hyperglycemia, acute renal failure with BUN to  creatinine 41/1.65 (baseline creatinine at 1.0-1.2).  Lactic acidosis  2.6> 2.2.  UA unimpressive for UTI. CT abdomen and pelvis with contrast showed findings consistent with fecal impaction within the distal sigmoid colon with subsequent distal large bowel obstruction.  CT angiography of chest with contrast showed no definite evidence of acute PE, though still shows concern for PE.  CXR-persistent mild patchy bilateral opacities with possible superimposed acute infiltrate.  He was just tired and IV fluids, antibiotics and admitted for further management. Patient is followed by Dr. Lovell Sheehan from surgery. Being managed with supportive care and edema. With ongoing abdominal distention GI was consulted 11/22, recommended Flexi-Seal for decompression.  Subjective: No fever, no chest pain, no nausea, no vomiting.  Denies abdominal pain.  Continues to have abdominal distention with decreasing soft BM.  Code oxygen saturation on 2 L and has completed antibiotic therapy.  Assessment & Plan:  Severe sepsis POA likely multifactorial secondary to aspiration pneumonia also with large bowel obstruction: Vital signs stable, leukocytosis improving.  Initially started on empiric vancomycin/cefepime, blood cultures growing gram-positive cocci, culture demonstrated to be contaminants.  per SSLP okay to start PUREED and HTL diet. Patient will complete antibiotics using meropenem for aspiration today.  Dilated colon possible obstruction versus ileus with constipation/history of chronic constipation ?OGILVIE SYNDROME: Appreciate surgery input, status post lactulose enema.  GI consulted due to ongoing decompression, advised Flexi-Seal for decompression and holding off on flex sigmoidoscopy decompression.  Continue on IV fluid hydration and electrolyte resuscitation.  Goal is for potassium above 4 and magnesium above 2. will follow GI service and general surgery recommendations.  Continue to watch patient's diet tolerance.   Patient  abdomen has remained distended despite neostigmine. Per GI increase miralax, continue tap water enema and use dulcolax.  Recent blood culture + in aerobic bottle only - staph in BCID 05/10/20 am- f/u culture, which appears to be contaminants.  Vancomycin discontinued on 05/14/2020.  Acute on chronic hypoxic respiratory failure from combination of aspiration pneumonia/abdominal distention: At baseline 2 L, currently needing 2.5 L nasal cannula.  Continue to monitor respiratory status closely, continue pulmonary support and complete antibiotic therapy (plan is for a total of 8 days meropenem); will complete antibiotics today.    Hypernatremia: from free water deficit from his bowel obstruction and decreased oral intake.   -Continue improving -will continue D5W infusion. -Sodium remains at 143 today.  Acute renal failure,baseline creatinine at 1.0-1.2: Multifactorial from sepsis/bowel obstruction.  Continue IV fluid hydration renal function improving.  Good urine output reported.  Creatinine 1.13 currently.  Recent Labs  Lab 05/13/20 0402 05/14/20 0525 05/15/20 0506 05/18/20 0729 05/19/20 0620  BUN 27* 23 22 25* 23  CREATININE 1.29* 1.25* 1.26* 1.17 1.13   Hyperkalemia at 5.3 on presentation/hypokalemia -potassium is still low currently; will continue providing repletion/supplementation as needed.  Goal is for potassium of 4 and Mg >2.  History of recurrent DVT/PE on Eliquis at baseline.  Currently n.p.o. and so cont therapeutic Lovenox.  Hyperthyroidism on methimazole, resume once able to take p.o.  Hyperlipidemia/hypertension: Blood pressure is overall stable.  Oral meds on hold   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: Appreciate speech therapy evaluation and recommendations -Dysphagia 1 with honey thick liquids has been initiated.  Hyperglycemia due to diabetes mellitus: HbA1c is still 7.9.  Blood sugar is stable despite being on D5W.    -continue sliding scale insulin.   Recent Labs  Lab 05/18/20 2000 05/18/20 2350 05/19/20 0400 05/19/20 0727 05/19/20 1110  GLUCAP 170* 121* 171* 165* 137*   Morbid obesity with Body mass index is 37.41 kg/m.   -Portion control discussed with patient.  ZOX:WRUEA w/ his brother 11/27 and updated on his situation and plan for neostigmine therapy.  Patient's brother is his healthcare partner Alinda Money and in agreement with intervention. Prognosis remains guarded; patient is DNR. He is at risk of decompensation in the setting of severe sepsis, abdominal distention bowel obstruction along with hypoxic respiratory failure and aspiration. Palliative care has been consulted and appreciate input and assistance.  Nutrition: Diet Order            DIET - DYS 1 Room service appropriate? Yes; Fluid consistency: Thin  Diet effective now                 DVT prophylaxis: SCDs Start: 05/11/20 0142 Code Status:   Code Status: DNR  Family Communication: No family at bedside.  Status is: Inpatient. Remains inpatient appropriate because: Ongoing management of abdominal distention renal failure respiratory failure, remains on IVF,unable to take p.o.  Dispo: The patient is from: SNF              Anticipated d/c is to: SNF              Anticipated d/c date is: > 3 days              Patient currently is not medically stable to d/c.   Consultants:GI  Procedures:see below for x-ray reports.  Culture/Microbiology    Component Value Date/Time   SDES LEFT ANTECUBITAL 05/10/2020 2230   SPECREQUEST  05/10/2020 2230    BOTTLES DRAWN AEROBIC AND ANAEROBIC  Blood Culture results may not be optimal due to an inadequate volume of blood received in culture bottles   CULT  05/10/2020 2230    NO GROWTH 5 DAYS Performed at [facility], [address] [place], [place] [address]    REPTSTATUS 05/15/2020 FINAL 05/10/2020 2230    Other culture-see note  Medications: Scheduled Meds: . bisacodyl  10 mg Rectal  QHS  . enoxaparin (LOVENOX) injection  110 mg Subcutaneous Q12H  . feeding supplement (GLUCERNA SHAKE)  237 mL Oral TID BM  . insulin aspart  0-9 Units Subcutaneous Q4H  . ipratropium-albuterol  3 mL Nebulization BID  . polyethylene glycol  17 g Oral BID   Continuous Infusions: . dextrose 5 % with KCl 20 mEq / L 20 mEq (05/19/20 1439)  . meropenem (MERREM) IV 1 g (05/19/20 1251)    Antimicrobials: Anti-infectives (From admission, onward)   Start     Dose/Rate Route Frequency Ordered Stop   05/13/20 0900  meropenem (MERREM) 1 g in sodium chloride 0.9 % 100 mL IVPB        1 g 200 mL/hr over 30 Minutes Intravenous Every 8 hours 05/13/20 0808 05/20/20 0559   05/11/20 2200  vancomycin (VANCOREADY) IVPB 1250 mg/250 mL  Status:  Discontinued        1,250 mg 166.7 mL/hr over 90 Minutes Intravenous Every 24 hours 05/11/20 0316 05/14/20 1016   05/11/20 1000  ceFEPIme (MAXIPIME) 2 g in sodium chloride 0.9 % 100 mL IVPB  Status:  Discontinued        2 g 200 mL/hr over 30 Minutes Intravenous Every 12 hours 05/11/20 0316 05/13/20 0808   05/11/20 0300  metroNIDAZOLE (FLAGYL) IVPB 500 mg  Status:  Discontinued        500 mg 100 mL/hr over 60 Minutes Intravenous Every 8 hours 05/11/20 0214 05/13/20 0808   05/10/20 2215  aztreonam (AZACTAM) 2 g in sodium chloride 0.9 % 100 mL IVPB  Status:  Discontinued        2 g 200 mL/hr over 30 Minutes Intravenous  Once 05/10/20 2205 05/10/20 2211   05/10/20 2215  vancomycin (VANCOCIN) IVPB 1000 mg/200 mL premix  Status:  Discontinued        1,000 mg 200 mL/hr over 60 Minutes Intravenous  Once 05/10/20 2205 05/10/20 2207   05/10/20 2215  vancomycin (VANCOREADY) IVPB 2000 mg/400 mL        2,000 mg 200 mL/hr over 120 Minutes Intravenous  Once 05/10/20 2207 05/11/20 0050   05/10/20 2215  ceFEPIme (MAXIPIME) 2 g in sodium chloride 0.9 % 100 mL IVPB        2 g 200 mL/hr over 30 Minutes Intravenous  Once 05/10/20 2211 05/10/20 2328     Objective:  Today's  Vitals   05/19/20 0403 05/19/20 0745 05/19/20 1425 05/19/20 1438  BP: (!) 124/55  (!) 133/105 (!) 148/78  Pulse: 96  (!) 154 90  Resp: 20  18   Temp: 99.5 F (37.5 C)  97.9 F (36.6 C)   TempSrc: Oral  Oral   SpO2: 100% 97% 92%   Weight:      Height:      PainSc:        Intake/Output Summary (Last 24 hours) at 05/19/2020 1603 Last data filed at 05/19/2020 1500 Gross per 24 hour  Intake 120 ml  Output 1050 ml  Net -930 ml   Filed Weights   05/10/20 2122  Weight: 111.6 kg   Weight change:  Intake/Output from previous day: 11/28 0701 - 11/29 0700 In: 360 [P.O.:360] Out: 1350 [Urine:1350]   Intake/Output this shift: No intake/output data recorded.  Examination: General exam: Alert, awake, oriented x 2; continue to have gurgling sound and diffuse rhonchi bilaterally.  Abdomen remains distended compared x-ray examination unchanged from 05/18/2020; no response to neostigmine. Respiratory system: Diffuse rhonchi bilaterally; no wheezing.  No using accessory muscle. Cardiovascular system: RRR, no rubs, no gallops, unable to assess JVD with body habitus Gastrointestinal system: Abdomen is distended, decreased bowel sounds on examination; reports no tenderness. Central nervous system: No new focal neurological deficits. Extremities: No cyanosis or clubbing.  Prevalon boots in place. Skin: No petechiae. Psychiatry:  Mood & affect appropriate.    Data Reviewed: I have personally reviewed following labs and imaging studies.  CBC: Recent Labs  Lab 05/13/20 0402 05/14/20 0525 05/15/20 0506 05/18/20 0729  WBC 12.3* 9.9 10.5 10.2  HGB 11.9* 12.3* 12.3* 11.4*  HCT 40.2 42.1 42.8 41.9  MCV 103.9* 102.7* 104.4* 108.8*  PLT 203 191 180 219   Basic Metabolic Panel: Recent Labs  Lab 05/13/20 0402 05/14/20 0525 05/15/20 0506 05/17/20 0738 05/18/20 0729 05/19/20 0620  NA 150* 147* 147*  --  147* 143  K 3.4* 3.1* 3.2*  --  4.6 4.4  CL 108 109 105  --  104 101  CO2 33*  32 35*  --  38* 38*  GLUCOSE 137* 175* 156*  --  147* 183*  BUN 27* 23 22  --  25* 23  CREATININE 1.29* 1.25* 1.26*  --  1.17 1.13  CALCIUM 8.1* 7.8* 7.9*  --  8.1* 8.2*  MG  --   --   --  2.5*  --   --    GFR: Estimated Creatinine Clearance: 69.5 mL/min (by C-G formula based on SCr of 1.13 mg/dL).   Liver Function Tests: Recent Labs  Lab 05/13/20 0402 05/14/20 0525  AST 30 30  ALT 29 21  ALKPHOS 47 44  BILITOT 0.4 0.6  PROT 7.0 6.9  ALBUMIN 2.8* 2.8*   CBG: Recent Labs  Lab 05/18/20 2000 05/18/20 2350 05/19/20 0400 05/19/20 0727 05/19/20 1110  GLUCAP 170* 121* 171* 165* 137*   Sepsis Labs: No results for input(s): PROCALCITON, LATICACIDVEN in the last 168 hours.  Recent Results (from the past 240 hour(s))  Resp Panel by RT-PCR (Flu A&B, Covid) Nasopharyngeal Swab     Status: None   Collection Time: 05/09/20 11:59 PM   Specimen: Nasopharyngeal Swab; Nasopharyngeal(NP) swabs in vial transport medium  Result Value Ref Range Status   SARS Coronavirus 2 by RT PCR NEGATIVE NEGATIVE Final    Comment: (NOTE) SARS-CoV-2 target nucleic acids are NOT DETECTED.  The SARS-CoV-2 RNA is generally detectable in upper respiratory specimens during the acute phase of infection. The lowest concentration of SARS-CoV-2 viral copies this assay can detect is 138 copies/mL. A negative result does not preclude SARS-Cov-2 infection and should not be used as the sole basis for treatment or other patient management decisions. A negative result may occur with  improper specimen collection/handling, submission of specimen other than nasopharyngeal swab, presence of viral mutation(s) within the areas targeted by this assay, and inadequate number of viral copies(<138 copies/mL). A negative result must be combined with clinical observations, patient history, and epidemiological information. The expected result is Negative.  Fact Sheet for Patients:   BloggerCourse.com  Fact Sheet for Healthcare Providers:  SeriousBroker.it  This test is no t yet approved or cleared by  the [place] FDA and  has been authorized for detection and/or diagnosis of SARS-CoV-2 by FDA under an Emergency Use Authorization (EUA). This EUA will remain  in effect (meaning this test can be used) for the duration of the COVID-19 declaration under Section 564(b)(1) of the Act, 21 U.S.C.section 360bbb-3(b)(1), unless the authorization is terminated  or revoked sooner.       Influenza A by PCR NEGATIVE NEGATIVE Final   Influenza B by PCR NEGATIVE NEGATIVE Final    Comment: (NOTE) The Xpert Xpress SARS-CoV-2/FLU/RSV plus assay is intended as an aid in the diagnosis of influenza from Nasopharyngeal swab specimens and should not be used as a sole basis for treatment. Nasal washings and aspirates are unacceptable for Xpert Xpress SARS-CoV-2/FLU/RSV testing.  Fact Sheet for Patients: BloggerCourse.comhttps://www.fda.gov/media/152166/download  Fact Sheet for Healthcare Providers: SeriousBroker.ithttps://www.fda.gov/media/152162/download  This test is not yet approved or cleared by the [place] FDA and has been authorized for detection and/or diagnosis of SARS-CoV-2 by FDA under an Emergency Use Authorization (EUA). This EUA will remain in effect (meaning this test can be used) for the duration of the COVID-19 declaration under Section 564(b)(1) of the Act, 21 U.S.C. section 360bbb-3(b)(1), unless the authorization is terminated or revoked.  Performed at [facility], [address] [place], [place] [address]   Culture, blood (routine x 2)     Status: Abnormal   Collection Time: 05/10/20  1:21 AM   Specimen: BLOOD RIGHT HAND  Result Value Ref Range Status   Specimen Description   Final    BLOOD RIGHT HAND Performed at [facility], [address] [place], [place] [address]    Special Requests   Final    BOTTLES DRAWN AEROBIC  AND ANAEROBIC Blood Culture adequate volume Performed at [facility], [address] [place], [place] [address]    Culture  Setup Time   Final    GRAM POSITIVE COCCI Gram Stain Report Called to,Read Back By and Verified With: WATLINGTON,C @ 2014 ON 05/10/20 BY JUW ANAEROBIC BOTTLE ONLY GS DONE @ [facility] CRITICAL RESULT CALLED TO, READ BACK BY AND VERIFIED WITH: C. TURNER,RN [id number] 05/11/2020 T. TYSOR    Culture (A)  Final    STAPHYLOCOCCUS HOMINIS THE SIGNIFICANCE OF ISOLATING THIS ORGANISM FROM A SINGLE SET OF BLOOD CULTURES WHEN MULTIPLE SETS ARE DRAWN IS UNCERTAIN. PLEASE NOTIFY THE MICROBIOLOGY DEPARTMENT WITHIN ONE WEEK IF SPECIATION AND SENSITIVITIES ARE REQUIRED. Performed at [facility], [address] [place], [place] [address]    Report Status 05/13/2020 FINAL  Final  Blood Culture ID Panel (Reflexed)     Status: Abnormal   Collection Time: 05/10/20  1:21 AM  Result Value Ref Range Status   Enterococcus faecalis NOT DETECTED NOT DETECTED Final   Enterococcus Faecium NOT DETECTED NOT DETECTED Final   Listeria monocytogenes NOT DETECTED NOT DETECTED Final   Staphylococcus species DETECTED (A) NOT DETECTED Final    Comment: CRITICAL RESULT CALLED TO, READ BACK BY AND VERIFIED WITH: Luiz Ochoa. TURNER,RN [id number] 05/11/2020 T. TYSOR    Staphylococcus aureus (BCID) NOT DETECTED NOT DETECTED Final   Staphylococcus epidermidis NOT DETECTED NOT DETECTED Final   Staphylococcus lugdunensis NOT DETECTED NOT DETECTED Final   Streptococcus species NOT DETECTED NOT DETECTED Final   Streptococcus agalactiae NOT DETECTED NOT DETECTED Final   Streptococcus pneumoniae NOT DETECTED NOT DETECTED Final   Streptococcus pyogenes NOT DETECTED NOT DETECTED Final   A.calcoaceticus-baumannii NOT DETECTED NOT DETECTED Final   Bacteroides fragilis NOT DETECTED NOT DETECTED Final  Enterobacterales NOT DETECTED NOT DETECTED Final   Enterobacter cloacae complex NOT DETECTED NOT DETECTED Final   Escherichia  coli NOT DETECTED NOT DETECTED Final   Klebsiella aerogenes NOT DETECTED NOT DETECTED Final   Klebsiella oxytoca NOT DETECTED NOT DETECTED Final   Klebsiella pneumoniae NOT DETECTED NOT DETECTED Final   Proteus species NOT DETECTED NOT DETECTED Final   Salmonella species NOT DETECTED NOT DETECTED Final   Serratia marcescens NOT DETECTED NOT DETECTED Final   Haemophilus influenzae NOT DETECTED NOT DETECTED Final   Neisseria meningitidis NOT DETECTED NOT DETECTED Final   Pseudomonas aeruginosa NOT DETECTED NOT DETECTED Final   Stenotrophomonas maltophilia NOT DETECTED NOT DETECTED Final   Candida albicans NOT DETECTED NOT DETECTED Final   Candida auris NOT DETECTED NOT DETECTED Final   Candida glabrata NOT DETECTED NOT DETECTED Final   Candida krusei NOT DETECTED NOT DETECTED Final   Candida parapsilosis NOT DETECTED NOT DETECTED Final   Candida tropicalis NOT DETECTED NOT DETECTED Final   Cryptococcus neoformans/gattii NOT DETECTED NOT DETECTED Final    Comment: Performed at [facility], [address], [place], [place] [address]  Culture, blood (routine x 2)     Status: Abnormal   Collection Time: 05/10/20  1:29 AM   Specimen: BLOOD LEFT HAND  Result Value Ref Range Status   Specimen Description   Final    BLOOD LEFT HAND Performed at [facility], [address] [place], [place] [address]    Special Requests   Final    BOTTLES DRAWN AEROBIC AND ANAEROBIC Blood Culture adequate volume Performed at [facility], [address] [place], [place] [address]    Culture  Setup Time   Final    GRAM POSITIVE COCCI Gram Stain Report Called to,Read Back By and Verified With: TURNER,C @ 2333 ON 05/10/20 BY JUW IN BOTH AEROBIC AND ANAEROBIC BOTTLES GS DONE @ [facility] CRITICAL VALUE NOTED.  VALUE IS CONSISTENT WITH PREVIOUSLY REPORTED AND CALLED VALUE. Performed at [facility], [address] [place], [place] [address]    Culture STAPHYLOCOCCUS CAPITIS (A)  Final   Report Status  05/15/2020 FINAL  Final   Organism ID, Bacteria STAPHYLOCOCCUS CAPITIS  Final      Susceptibility   Staphylococcus capitis - MIC*    CIPROFLOXACIN 4 RESISTANT Resistant     ERYTHROMYCIN >=8 RESISTANT Resistant     GENTAMICIN <=0.5 SENSITIVE Sensitive     OXACILLIN <=0.25 SENSITIVE Sensitive     TETRACYCLINE >=16 RESISTANT Resistant     VANCOMYCIN <=0.5 SENSITIVE Sensitive     TRIMETH/SULFA <=10 SENSITIVE Sensitive     CLINDAMYCIN >=8 RESISTANT Resistant     RIFAMPIN <=0.5 SENSITIVE Sensitive     Inducible Clindamycin NEGATIVE Sensitive     * STAPHYLOCOCCUS CAPITIS  Urine Culture     Status: Abnormal   Collection Time: 05/10/20  9:37 AM   Specimen: Urine, Catheterized  Result Value Ref Range Status   Specimen Description   Final    URINE, CATHETERIZED Performed at [facility], [address], [place], [place] [address]    Special Requests   Final    NONE Performed at [facility] [address], [place], [place] [address]    Culture >=100,000 COLONIES/mL CITROBACTER KOSERI (A)  Final   Report Status 05/12/2020 FINAL  Final   Organism ID, Bacteria CITROBACTER KOSERI (A)  Final      Susceptibility   Citrobacter koseri - MIC*    CEFAZOLIN >=64 RESISTANT Resistant  CEFEPIME <=0.12 SENSITIVE Sensitive     CEFTRIAXONE >=64 RESISTANT Resistant     CIPROFLOXACIN >=4 RESISTANT Resistant     GENTAMICIN <=1 SENSITIVE Sensitive     IMIPENEM 0.5 SENSITIVE Sensitive     NITROFURANTOIN 64 INTERMEDIATE Intermediate     TRIMETH/SULFA <=20 SENSITIVE Sensitive     PIP/TAZO 16 SENSITIVE Sensitive     * >=100,000 COLONIES/mL CITROBACTER KOSERI  Culture, blood (Routine X 2) w Reflex to ID Panel     Status: Abnormal   Collection Time: 05/10/20  9:41 PM   Specimen: Right Antecubital; Blood  Result Value Ref Range Status   Specimen Description   Final    RIGHT ANTECUBITAL Performed at [facility], [address], [place], [place] [address]    Special Requests   Final    BOTTLES DRAWN  AEROBIC AND ANAEROBIC Blood Culture adequate volume Performed at [facility], [address] [place], [place] [address]    Culture  Setup Time   Final    GRAM POSITIVE COCCI IN CLUSTERS RECOVERED FROM THE ANAEROBIC BOTTLE Gram Stain Report Called to,Read Back By and Verified With: WRIGHT,EMILY AT 1314 ON 05/12/2020 BY BAUGHAM,M. Performed at [facility] CRITICAL RESULT CALLED TO, READ BACK BY AND VERIFIED WITHCorliss Blacker RN [id number] 05/12/20 A BROWNING Performed at [facility], [address], [place], [place] [address]    Culture STAPHYLOCOCCUS CAPITIS (A)  Final   Report Status 05/14/2020 FINAL  Final   Organism ID, Bacteria STAPHYLOCOCCUS CAPITIS  Final      Susceptibility   Staphylococcus capitis - MIC*    CIPROFLOXACIN <=0.5 SENSITIVE Sensitive     ERYTHROMYCIN >=8 RESISTANT Resistant     GENTAMICIN <=0.5 SENSITIVE Sensitive     OXACILLIN SENSITIVE Sensitive     TETRACYCLINE >=16 RESISTANT Resistant     VANCOMYCIN <=0.5 SENSITIVE Sensitive     TRIMETH/SULFA <=10 SENSITIVE Sensitive     CLINDAMYCIN >=8 RESISTANT Resistant     RIFAMPIN <=0.5 SENSITIVE Sensitive     Inducible Clindamycin NEGATIVE Sensitive     * STAPHYLOCOCCUS CAPITIS  Blood Culture ID Panel (Reflexed)     Status: Abnormal   Collection Time: 05/10/20  9:41 PM  Result Value Ref Range Status   Enterococcus faecalis NOT DETECTED NOT DETECTED Final   Enterococcus Faecium NOT DETECTED NOT DETECTED Final   Listeria monocytogenes NOT DETECTED NOT DETECTED Final   Staphylococcus species DETECTED (A) NOT DETECTED Final    Comment: CRITICAL RESULT CALLED TO, READ BACK BY AND VERIFIED WITHCorliss Blacker RN [id number] 05/12/20 A BROWNING    Staphylococcus aureus (BCID) NOT DETECTED NOT DETECTED Final   Staphylococcus epidermidis NOT DETECTED NOT DETECTED Final   Staphylococcus lugdunensis NOT DETECTED NOT DETECTED Final   Streptococcus species NOT DETECTED NOT DETECTED Final   Streptococcus agalactiae NOT DETECTED  NOT DETECTED Final   Streptococcus pneumoniae NOT DETECTED NOT DETECTED Final   Streptococcus pyogenes NOT DETECTED NOT DETECTED Final   A.calcoaceticus-baumannii NOT DETECTED NOT DETECTED Final   Bacteroides fragilis NOT DETECTED NOT DETECTED Final   Enterobacterales NOT DETECTED NOT DETECTED Final   Enterobacter cloacae complex NOT DETECTED NOT DETECTED Final   Escherichia coli NOT DETECTED NOT DETECTED Final   Klebsiella aerogenes NOT DETECTED NOT DETECTED Final   Klebsiella oxytoca NOT DETECTED NOT DETECTED Final   Klebsiella pneumoniae NOT DETECTED NOT DETECTED Final   Proteus species NOT DETECTED NOT DETECTED Final   Salmonella species NOT DETECTED NOT DETECTED Final   Serratia  marcescens NOT DETECTED NOT DETECTED Final   Haemophilus influenzae NOT DETECTED NOT DETECTED Final   Neisseria meningitidis NOT DETECTED NOT DETECTED Final   Pseudomonas aeruginosa NOT DETECTED NOT DETECTED Final   Stenotrophomonas maltophilia NOT DETECTED NOT DETECTED Final   Candida albicans NOT DETECTED NOT DETECTED Final   Candida auris NOT DETECTED NOT DETECTED Final   Candida glabrata NOT DETECTED NOT DETECTED Final   Candida krusei NOT DETECTED NOT DETECTED Final   Candida parapsilosis NOT DETECTED NOT DETECTED Final   Candida tropicalis NOT DETECTED NOT DETECTED Final   Cryptococcus neoformans/gattii NOT DETECTED NOT DETECTED Final    Comment: Performed at [facility], [address], [place], [place] [address]  Urine culture     Status: None   Collection Time: 05/10/20 10:02 PM   Specimen: In/Out Cath Urine  Result Value Ref Range Status   Specimen Description   Final    IN/OUT CATH URINE Performed at [facility], [address] [place], [place] [address]    Special Requests   Final    NONE Performed at [facility], [address], [place], [place] [address]    Culture   Final    NO GROWTH Performed at [facility], [address] [place], [place] [address]    Report  Status 05/12/2020 FINAL  Final  Culture, blood (Routine X 2) w Reflex to ID Panel     Status: None   Collection Time: 05/10/20 10:30 PM   Specimen: Left Antecubital; Blood  Result Value Ref Range Status   Specimen Description LEFT ANTECUBITAL  Final   Special Requests   Final    BOTTLES DRAWN AEROBIC AND ANAEROBIC Blood Culture results may not be optimal due to an inadequate volume of blood received in culture bottles   Culture   Final    NO GROWTH 5 DAYS Performed at [facility], [address], [place], [place] [address]    Report Status 05/15/2020 FINAL  Final  MRSA PCR Screening     Status: Abnormal   Collection Time: 05/11/20  2:14 AM   Specimen: Nasal Mucosa; Nasopharyngeal  Result Value Ref Range Status   MRSA by PCR POSITIVE (A) NEGATIVE Final    Comment:        The GeneXpert MRSA Assay (FDA approved for NASAL specimens only), is one component of a comprehensive MRSA colonization surveillance program. It is not intended to diagnose MRSA infection nor to guide or monitor treatment for MRSA infections. RESULT CALLED TO, READ BACK BY AND VERIFIED WITH: SINSKIE,P @ 0514 ON 05/11/20 BY JUW Performed at [facility], [address] [place], [place] [address]      Radiology Studies: DG Abd 1 View  Result Date: 05/18/2020 CLINICAL DATA:  Ileus EXAM: ABDOMEN - 1 VIEW COMPARISON:  05/17/2020 FINDINGS: Prominent gaseous distension of the sigmoid colon is similar to slightly progressed compared to the prior radiograph. Moderate volume of stool persist throughout the colon. Nondilated loops of small bowel. No gross free intraperitoneal air on supine imaging. Similar lumbar spondylosis. IMPRESSION: Prominent gaseous distension of the sigmoid colon is similar to slightly progressed compared to the prior radiograph. Moderate volume of stool persist throughout the colon. Electronically Signed   By: Duanne Guess D.O.   On: 05/18/2020 11:15   DG Abd Portable 2V  Result Date:  05/19/2020 CLINICAL DATA:  ABD distention. Limited Hx due to Pts difficulty with speech post stroke. Hx HTN, stroke, peg tube, nonsmoker EXAM: PORTABLE ABDOMEN -  2 VIEW COMPARISON:  05/18/2020 and earlier exams.  CT, 05/10/2020. FINDINGS: Distended sigmoid colon with moderate increased stool. This is similar to the prior exam. No other bowel dilation. IMPRESSION: 1. No significant change the previous day's study. Persistent sigmoid colon distention with moderate increased stool. No convincing bowel obstruction. Electronically Signed   By: Amie Portland M.D.   On: 05/19/2020 12:13    LOS: 8 days    Time spent 30 minutes in the care of this patient  Vassie Loll, MD [facility]  05/19/2020, 4:03 PM

## 2020-05-20 ENCOUNTER — Inpatient Hospital Stay (HOSPITAL_COMMUNITY): Payer: Medicare Other

## 2020-05-20 DIAGNOSIS — A419 Sepsis, unspecified organism: Secondary | ICD-10-CM

## 2020-05-20 DIAGNOSIS — K5981 Ogilvie syndrome: Secondary | ICD-10-CM

## 2020-05-20 DIAGNOSIS — R6520 Severe sepsis without septic shock: Secondary | ICD-10-CM

## 2020-05-20 LAB — GLUCOSE, CAPILLARY
Glucose-Capillary: 123 mg/dL — ABNORMAL HIGH (ref 70–99)
Glucose-Capillary: 130 mg/dL — ABNORMAL HIGH (ref 70–99)
Glucose-Capillary: 134 mg/dL — ABNORMAL HIGH (ref 70–99)
Glucose-Capillary: 137 mg/dL — ABNORMAL HIGH (ref 70–99)
Glucose-Capillary: 140 mg/dL — ABNORMAL HIGH (ref 70–99)
Glucose-Capillary: 168 mg/dL — ABNORMAL HIGH (ref 70–99)
Glucose-Capillary: 207 mg/dL — ABNORMAL HIGH (ref 70–99)

## 2020-05-20 LAB — BASIC METABOLIC PANEL
Anion gap: 6 (ref 5–15)
BUN: 18 mg/dL (ref 8–23)
CO2: 40 mmol/L — ABNORMAL HIGH (ref 22–32)
Calcium: 8.2 mg/dL — ABNORMAL LOW (ref 8.9–10.3)
Chloride: 99 mmol/L (ref 98–111)
Creatinine, Ser: 0.91 mg/dL (ref 0.61–1.24)
GFR, Estimated: >60 mL/min (ref >60)
Glucose, Bld: 138 mg/dL — ABNORMAL HIGH (ref 70–99)
Potassium: 4.3 mmol/L (ref 3.5–5.1)
Sodium: 145 mmol/L (ref 135–145)

## 2020-05-20 MED ORDER — LINACLOTIDE 145 MCG PO CAPS
145.0000 ug | ORAL_CAPSULE | Freq: Every day | ORAL | Status: DC
  Administered 2020-05-20 – 2020-05-22 (×3): 145 ug via ORAL
  Filled 2020-05-20 (×3): qty 1

## 2020-05-20 MED ORDER — METHIMAZOLE 5 MG PO TABS
2.5000 mg | ORAL_TABLET | Freq: Every day | ORAL | Status: DC
  Administered 2020-05-20 – 2020-05-22 (×3): 2.5 mg via ORAL
  Filled 2020-05-20 (×6): qty 1

## 2020-05-20 MED ORDER — POLYETHYLENE GLYCOL 3350 17 G PO PACK
17.0000 g | PACK | Freq: Every day | ORAL | Status: DC
  Administered 2020-05-20 – 2020-05-22 (×3): 17 g via ORAL
  Filled 2020-05-20 (×3): qty 1

## 2020-05-20 NOTE — Progress Notes (Addendum)
PROGRESS NOTE    GIDEON BURSTEIN  UVO:[id number] DOB: 03-Jun-1946 DOA: 05/10/2020 PCP: Sharee Holster, NP   Chief Complaint  Patient presents with  . 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 As per report: he was seen in the ED on late evening of 11/19 due to shortness of breath whereby he was requiring more supplemental oxygen and his oxygen was increased from 2 L/min to 3 L/min at the nursing facility, breathing treatment was also provided, but his O2 sats continued to be in the 80s, so he was taken to the ED for further evaluation.   In the ED, patient was suspected to have aspiration pneumonia due to his history of dysphagia and prior aspiration pneumonia, breathing treatment (albuterol and Atrovent) and small amount of white and occasionally yellow mucus were suctioned from the nares and throat, He was started on IV clindamycin.  CT scan done at that time does not reveal any specific abnormalities and patient had no leukocytosis, BNP was normal, respiratory panel was negative for influenza A, B and SARS coronavirus 2, so patient was discharged back to [facility] for a 7-day course of IV clindamycin. Blood culture drawn when patient came to the ED was positive for gram-positive cocci in one of the 2 cultures (possibly contaminated), but based on patient's clinical presentation, he was brought back to the emergency department. In the ED, he was febrile and tachycardic, LABS- leukocytosis, hypernatremia,, hyperglycemia, acute renal failure with BUN to  creatinine 41/1.65 (baseline creatinine at 1.0-1.2).  Lactic acidosis  2.6> 2.2.  UA unimpressive for UTI. CT abdomen and pelvis with contrast showed findings consistent with fecal impaction within the distal sigmoid colon with subsequent distal large bowel obstruction.  CT angiography of chest with contrast showed no definite evidence of acute PE, though still shows concern for PE.  CXR-persistent mild patchy bilateral opacities with possible superimposed acute infiltrate.  He was just tired and IV fluids, antibiotics and admitted for further management. Patient is followed by Dr. Lovell Sheehan from surgery. Being managed with supportive care and edema. With ongoing abdominal distention GI was consulted 11/22, recommended Flexi-Seal for decompression.  Subjective: No fever, no chest pain, no nausea, no vomiting.  Denies abdominal pain.  Continues to have abdominal distention with decreasing soft BM.  Code oxygen saturation on 2 L and has completed antibiotic therapy.  Assessment & Plan:  Severe sepsis POA likely multifactorial secondary to aspiration pneumonia also with large bowel obstruction: Vital signs stable, leukocytosis improving.  Initially started on empiric vancomycin/cefepime, blood cultures growing gram-positive cocci, culture demonstrated to be contaminants.  per SSLP okay to start PUREED and HTL diet. Patient has completed antibiotic therapy as on 05/19/20. No fever.  Dilated colon possible obstruction versus ileus with constipation/history of chronic constipation ?OGILVIE SYNDROME: Appreciate surgery input, status post lactulose enema.  GI consulted due to ongoing decompression, advised Flexi-Seal for decompression and holding off on flex sigmoidoscopy decompression.  Continue on IV fluid hydration and electrolyte resuscitation.  Goal is for potassium above 4 and magnesium above 2. will follow GI service and general surgery recommendations.  Continue to watch patient's diet tolerance.  Patient  abdomen has remained distended despite neostigmine. Per GI increase miralax, continue tap water enema and use dulcolax nightly.  Patient may end requiring colonoscopy decompression at some point.  Recent blood culture + in aerobic bottle only - staph in BCID 05/10/20 am- f/u culture, which appears to be contaminants.  Vancomycin discontinued on 05/14/2020.  Acute on chronic hypoxic respiratory failure from combination of aspiration pneumonia/abdominal distention: At baseline 2 L, currently needing 2.5 L nasal cannula.  Continue to monitor respiratory status closely, continue pulmonary support and complete antibiotic therapy (plan is for a total of 8 days meropenem); will complete antibiotics today.    Hypernatremia: from free water deficit from his bowel obstruction and decreased oral intake.   -Continue improving -will continue D5W infusion. -Sodium remains at 143 today.  Acute renal failure,baseline creatinine at 1.0-1.2: Multifactorial from sepsis/bowel obstruction.  Continue IV fluid hydration renal function improving.  Good urine output reported.  Creatinine 0.91 currently.  Recent Labs  Lab 05/14/20 0525 05/15/20 0506 05/18/20 0729 05/19/20 0620 05/20/20 0439  BUN 23 22 25* 23 18  CREATININE 1.25* 1.26* 1.17 1.13 0.91   Hyperkalemia at 5.3 on presentation/hypokalemia -potassium is still low currently; will continue providing repletion/supplementation as needed.  Goal is for potassium of 4 and Mg >2.  History of recurrent DVT/PE on Eliquis at baseline.  Currently n.p.o. and so cont therapeutic Lovenox.  Hyperthyroidism  -We will resume methimazole.  Hyperlipidemia/hypertension: Blood pressure is overall stable.  Oral meds on hold   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: Appreciate speech therapy evaluation and recommendations -Continue dysphagia 1 with honey thick liquids has been initiated.  Hyperglycemia due to diabetes mellitus: HbA1c  is still 7.9.   -Blood sugar has remained stable despite being on D5W.   -continue sliding scale insulin and follow CBGs..   Recent Labs  Lab 05/19/20 1953 05/20/20 0047 05/20/20 0401 05/20/20 0725 05/20/20 1113  GLUCAP 116* 134* 130* 140* 207*   Morbid obesity with Body mass index is 37.41 kg/m.   -Portion control discussed with patient.  ZOX:WRUEAGOC:spoke w/ his brother 11/27 and updated on his situation and plan for neostigmine therapy.  Patient's brother is his healthcare partner Alinda Moneyony and in agreement with intervention. Prognosis remains guarded; patient is DNR. He is at risk of decompensation in the setting of severe sepsis, abdominal distention bowel obstruction along with hypoxic respiratory failure and aspiration. Palliative care has been consulted and appreciate input and assistance.  Nutrition: Diet Order            DIET - DYS 1 Room service appropriate? Yes; Fluid consistency: Honey Thick  Diet effective now                 DVT prophylaxis: SCDs Start: 05/11/20 0142 Code Status:   Code Status: DNR  Family Communication: No family at bedside.  Status is: Inpatient. Remains inpatient appropriate because: Ongoing management of abdominal distention renal failure respiratory failure, remains on IVF,unable to take p.o.  Dispo: The patient is from: SNF              Anticipated d/c is to: SNF              Anticipated d/c date is: > 3 days              Patient currently is not medically stable to d/c.   Consultants:GI  Procedures:see below for x-ray reports.  Culture/Microbiology    Component Value Date/Time   SDES LEFT ANTECUBITAL 05/10/2020  2230   SPECREQUEST  05/10/2020 2230    BOTTLES DRAWN AEROBIC AND ANAEROBIC Blood Culture results may not be optimal due to an inadequate volume of blood received in culture bottles   CULT  05/10/2020 2230    NO GROWTH 5 DAYS Performed at [facility], [address], [place], [place] [address]    REPTSTATUS 05/15/2020 FINAL  05/10/2020 2230    Other culture-see note  Medications: Scheduled Meds: . bisacodyl  10 mg Rectal QHS  . enoxaparin (LOVENOX) injection  110 mg Subcutaneous Q12H  . feeding supplement (GLUCERNA SHAKE)  237 mL Oral TID BM  . insulin aspart  0-9 Units Subcutaneous Q4H  . ipratropium-albuterol  3 mL Nebulization BID  . polyethylene glycol  17 g Oral BID   Continuous Infusions: . dextrose 5 % with KCl 20 mEq / L 20 mEq (05/19/20 1630)    Antimicrobials: Anti-infectives (From admission, onward)   Start     Dose/Rate Route Frequency Ordered Stop   05/13/20 0900  meropenem (MERREM) 1 g in sodium chloride 0.9 % 100 mL IVPB        1 g 200 mL/hr over 30 Minutes Intravenous Every 8 hours 05/13/20 0808 05/19/20 2306   05/11/20 2200  vancomycin (VANCOREADY) IVPB 1250 mg/250 mL  Status:  Discontinued        1,250 mg 166.7 mL/hr over 90 Minutes Intravenous Every 24 hours 05/11/20 0316 05/14/20 1016   05/11/20 1000  ceFEPIme (MAXIPIME) 2 g in sodium chloride 0.9 % 100 mL IVPB  Status:  Discontinued        2 g 200 mL/hr over 30 Minutes Intravenous Every 12 hours 05/11/20 0316 05/13/20 0808   05/11/20 0300  metroNIDAZOLE (FLAGYL) IVPB 500 mg  Status:  Discontinued        500 mg 100 mL/hr over 60 Minutes Intravenous Every 8 hours 05/11/20 0214 05/13/20 0808   05/10/20 2215  aztreonam (AZACTAM) 2 g in sodium chloride 0.9 % 100 mL IVPB  Status:  Discontinued        2 g 200 mL/hr over 30 Minutes Intravenous  Once 05/10/20 2205 05/10/20 2211   05/10/20 2215  vancomycin (VANCOCIN) IVPB 1000 mg/200 mL premix  Status:  Discontinued        1,000 mg 200 mL/hr over 60 Minutes Intravenous  Once 05/10/20 2205 05/10/20 2207   05/10/20 2215  vancomycin (VANCOREADY) IVPB 2000 mg/400 mL        2,000 mg 200 mL/hr over 120 Minutes Intravenous  Once 05/10/20 2207 05/11/20 0050   05/10/20 2215  ceFEPIme (MAXIPIME) 2 g in sodium chloride 0.9 % 100 mL IVPB        2 g 200 mL/hr over 30 Minutes Intravenous  Once  05/10/20 2211 05/10/20 2328     Objective:  Today's Vitals   05/20/20 0300 05/20/20 0400 05/20/20 0740 05/20/20 1000  BP:  137/75    Pulse:  88    Resp:  20    Temp:  97.6 F (36.4 C)    TempSrc:  Oral    SpO2:  100% 97%   Weight:      Height:      PainSc: 0-No pain   0-No pain    Intake/Output Summary (Last 24 hours) at 05/20/2020 1318 Last data filed at 05/20/2020 0900 Gross per 24 hour  Intake 5027.12 ml  Output 1150 ml  Net 3877.12 ml   Filed Weights   05/10/20 2122  Weight: 111.6 kg   Weight change:  Intake/Output from previous day: 11/29 0701 - 11/30 0700 In: 4627.1 [I.V.:4627.1] Out: 1150 [Urine:1150]   Intake/Output this shift: Total I/O In: 400 [P.O.:400] Out: -   Examination: General exam: Alert, awake, oriented x 2; in good spirit.  Continues to have a crackling sound and diffuse rhonchi bilaterally during auscultation.  Patient's abdomen remains distended.  2 small bowel movements overnight.  Reports no nausea or vomiting. Respiratory system: Positive rhonchi bilaterally; no wheezing.  No using accessory muscle.  2-3 nasal cannula supplementation in place. Cardiovascular system:RRR. No murmurs, rubs, gallops. Gastrointestinal system: Abdomen is distended, tense and with hypoactive bowel sounds.  No tenderness on palpation.  No rebound or guarding.   Central nervous system: Alert and oriented. No focal neurological deficits. Extremities: No cyanosis or clubbing; trace to 1+ edema appreciated bilaterally.  Prevalon boots in place. Skin: No rashes, no petechiae. Psychiatry:  Mood & affect appropriate.    Data Reviewed: I have personally reviewed following labs and imaging studies.  CBC: Recent Labs  Lab 05/14/20 0525 05/15/20 0506 05/18/20 0729  WBC 9.9 10.5 10.2  HGB 12.3* 12.3* 11.4*  HCT 42.1 42.8 41.9  MCV 102.7* 104.4* 108.8*  PLT 191 180 219   Basic Metabolic Panel: Recent Labs  Lab 05/14/20 0525 05/15/20 0506 05/17/20 0738  05/18/20 0729 05/19/20 0620 05/20/20 0439  NA 147* 147*  --  147* 143 145  K 3.1* 3.2*  --  4.6 4.4 4.3  CL 109 105  --  104 101 99  CO2 32 35*  --  38* 38* 40*  GLUCOSE 175* 156*  --  147* 183* 138*  BUN 23 22  --  25* 23 18  CREATININE 1.25* 1.26*  --  1.17 1.13 0.91  CALCIUM 7.8* 7.9*  --  8.1* 8.2* 8.2*  MG  --   --  2.5*  --   --   --    GFR: Estimated Creatinine Clearance: 86.3 mL/min (by C-G formula based on SCr of 0.91 mg/dL).   Liver Function Tests: Recent Labs  Lab 05/14/20 0525  AST 30  ALT 21  ALKPHOS 44  BILITOT 0.6  PROT 6.9  ALBUMIN 2.8*   CBG: Recent Labs  Lab 05/19/20 1953 05/20/20 0047 05/20/20 0401 05/20/20 0725 05/20/20 1113  GLUCAP 116* 134* 130* 140* 207*   Sepsis Labs: No results for input(s): PROCALCITON, LATICACIDVEN in the last 168 hours.  Recent Results (from the past 240 hour(s))  Culture, blood (Routine X 2) w Reflex to ID Panel     Status: Abnormal   Collection Time: 05/10/20  9:41 PM   Specimen: Right Antecubital; Blood  Result Value Ref Range Status   Specimen Description   Final    RIGHT ANTECUBITAL Performed at [facility], [address] [place], [place] [address]    Special Requests   Final    BOTTLES DRAWN AEROBIC AND ANAEROBIC Blood Culture adequate volume Performed at [facility], [address], [place], [place] [address]    Culture  Setup Time   Final    GRAM POSITIVE COCCI IN CLUSTERS RECOVERED FROM THE ANAEROBIC BOTTLE Gram Stain Report Called to,Read Back By and Verified With: WRIGHT,EMILY AT 1314 ON 05/12/2020 BY BAUGHAM,M. Performed at [facility] CRITICAL RESULT CALLED TO, READ BACK BY AND VERIFIED WITHCorliss Blacker RN [id number] 05/12/20 A BROWNING Performed at [facility], [address], [place], [place] [address]    Culture STAPHYLOCOCCUS CAPITIS (A)  Final   Report Status 05/14/2020 FINAL  Final   Organism ID, Bacteria STAPHYLOCOCCUS CAPITIS  Final      Susceptibility   Staphylococcus capitis  - MIC*    CIPROFLOXACIN <=0.5 SENSITIVE Sensitive     ERYTHROMYCIN >=8 RESISTANT Resistant     GENTAMICIN <=0.5 SENSITIVE Sensitive     OXACILLIN SENSITIVE Sensitive     TETRACYCLINE >=16 RESISTANT Resistant     VANCOMYCIN <=0.5 SENSITIVE Sensitive     TRIMETH/SULFA <=10 SENSITIVE Sensitive     CLINDAMYCIN >=8 RESISTANT Resistant     RIFAMPIN <=0.5 SENSITIVE Sensitive     Inducible Clindamycin NEGATIVE Sensitive     * STAPHYLOCOCCUS CAPITIS  Blood Culture ID Panel (Reflexed)     Status: Abnormal   Collection Time: 05/10/20  9:41 PM  Result Value Ref Range Status   Enterococcus faecalis NOT DETECTED NOT DETECTED Final   Enterococcus Faecium NOT DETECTED NOT DETECTED Final   Listeria monocytogenes NOT DETECTED NOT DETECTED Final   Staphylococcus species DETECTED (A) NOT DETECTED Final    Comment: CRITICAL RESULT CALLED TO, READ BACK BY AND VERIFIED WITHCorliss Blacker RN 1610 05/12/20 A BROWNING    Staphylococcus aureus (BCID) NOT DETECTED NOT DETECTED Final   Staphylococcus epidermidis NOT DETECTED NOT DETECTED Final   Staphylococcus lugdunensis NOT DETECTED NOT DETECTED Final   Streptococcus species NOT DETECTED NOT DETECTED Final   Streptococcus agalactiae NOT DETECTED NOT DETECTED Final   Streptococcus pneumoniae NOT DETECTED NOT DETECTED Final   Streptococcus pyogenes NOT DETECTED NOT DETECTED Final   A.calcoaceticus-baumannii NOT DETECTED NOT DETECTED Final   Bacteroides fragilis NOT DETECTED NOT DETECTED Final   Enterobacterales NOT DETECTED NOT DETECTED Final   Enterobacter cloacae complex NOT DETECTED NOT DETECTED Final   Escherichia coli NOT DETECTED NOT DETECTED Final   Klebsiella aerogenes NOT DETECTED NOT DETECTED Final   Klebsiella oxytoca NOT DETECTED NOT DETECTED Final   Klebsiella pneumoniae NOT DETECTED NOT DETECTED Final   Proteus species NOT DETECTED NOT DETECTED Final   Salmonella species NOT DETECTED NOT DETECTED Final   Serratia marcescens NOT DETECTED NOT  DETECTED Final   Haemophilus influenzae NOT DETECTED NOT DETECTED Final   Neisseria meningitidis NOT DETECTED NOT DETECTED Final   Pseudomonas aeruginosa NOT DETECTED NOT DETECTED Final   Stenotrophomonas maltophilia NOT DETECTED NOT DETECTED Final   Candida albicans NOT DETECTED NOT DETECTED Final   Candida auris NOT DETECTED NOT DETECTED Final   Candida glabrata NOT DETECTED NOT DETECTED Final   Candida krusei NOT DETECTED NOT DETECTED Final   Candida parapsilosis NOT DETECTED NOT DETECTED Final   Candida tropicalis NOT DETECTED NOT DETECTED Final   Cryptococcus neoformans/gattii NOT DETECTED NOT DETECTED Final    Comment: Performed at [facility], [address], [place], [place] [address]  Urine culture     Status: None   Collection Time: 05/10/20 10:02 PM   Specimen: In/Out Cath Urine  Result Value Ref Range Status   Specimen Description   Final    IN/OUT CATH URINE Performed at [facility], [address] [place], [place] [address]    Special Requests   Final    NONE Performed at [facility], [address] [place], [place] [address]    Culture   Final    NO GROWTH Performed at [facility], [address] [place], [place] [address]    Report Status 05/12/2020 FINAL  Final  Culture, blood (Routine X 2) w Reflex to ID Panel     Status: None   Collection  Time: 05/10/20 10:30 PM   Specimen: Left Antecubital; Blood  Result Value Ref Range Status   Specimen Description LEFT ANTECUBITAL  Final   Special Requests   Final    BOTTLES DRAWN AEROBIC AND ANAEROBIC Blood Culture results may not be optimal due to an inadequate volume of blood received in culture bottles   Culture   Final    NO GROWTH 5 DAYS Performed at [facility], [address], [place], [place] [address]    Report Status 05/15/2020 FINAL  Final  MRSA PCR Screening     Status: Abnormal   Collection Time: 05/11/20  2:14 AM   Specimen: Nasal Mucosa; Nasopharyngeal  Result Value Ref Range Status     MRSA by PCR POSITIVE (A) NEGATIVE Final    Comment:        The GeneXpert MRSA Assay (FDA approved for NASAL specimens only), is one component of a comprehensive MRSA colonization surveillance program. It is not intended to diagnose MRSA infection nor to guide or monitor treatment for MRSA infections. RESULT CALLED TO, READ BACK BY AND VERIFIED WITH: SINSKIE,P @ 0514 ON 05/11/20 BY JUW Performed at [facility], [address], [place], [place] [address]      Radiology Studies: DG Abd 2 Views  Result Date: 05/20/2020 CLINICAL DATA:  Abdominal pain and distension EXAM: ABDOMEN - 2 VIEW COMPARISON:  May 19, 2020 FINDINGS: Supine and upright images obtained. There is stool throughout much of the colon, most pronounced in the right colon, stable. There is no appreciable bowel dilatation or air-fluid level to suggest bowel obstruction. No free air. There are vascular calcifications in the pelvis. There is atelectatic change in the left lung base. IMPRESSION: Extensive stool remains in the right colon. No evident bowel obstruction or free air. Left lung base atelectasis. Electronically Signed   By: Bretta Bang III M.D.   On: 05/20/2020 10:32   DG Abd Portable 2V  Result Date: 05/19/2020 CLINICAL DATA:  ABD distention. Limited Hx due to Pts difficulty with speech post stroke. Hx HTN, stroke, peg tube, nonsmoker EXAM: PORTABLE ABDOMEN - 2 VIEW COMPARISON:  05/18/2020 and earlier exams.  CT, 05/10/2020. FINDINGS: Distended sigmoid colon with moderate increased stool. This is similar to the prior exam. No other bowel dilation. IMPRESSION: 1. No significant change the previous day's study. Persistent sigmoid colon distention with moderate increased stool. No convincing bowel obstruction. Electronically Signed   By: Amie Portland M.D.   On: 05/19/2020 12:13    LOS: 9 days    Time spent 30 minutes in the care of this patient  Vassie Loll, MD [facility]  05/20/2020, 1:18  PM

## 2020-05-20 NOTE — Progress Notes (Addendum)
Subjective: Resting comfortably. No abdominal pain. Had a BM yesterday. Discussed with nursing staff who report this was 2 small balls of stool, no brbpr or melena. No nausea or vomiting. Tolerating his diet well. Per nursing staff, he ate 75% of his breakfast this morning.   Objective: Vital signs in last 24 hours: Temp:  [97.6 F (36.4 C)-98.2 F (36.8 C)] 97.6 F (36.4 C) (11/30 0400) Pulse Rate:  [88-154] 88 (11/30 0400) Resp:  [18-20] 20 (11/30 0400) BP: (108-148)/(54-105) 137/75 (11/30 0400) SpO2:  [92 %-100 %] 97 % (11/30 0740) Last BM Date: 05/19/20 General:   Alert, no acute distress, dysarthric speech, unable to assess orientation, right hemiparesis.  Abdomen:  Bowel sounds present but hypoactive, largely distended but non-rigid. Minimal TTP in RUQ.Protruding umbilical hernia that is soft and reducible. No rebound or guarding.  Extremities:  With 1+ LE pitting edema up to mid shin. Psych:  Normal mood and affect.  Intake/Output from previous day: 11/29 0701 - 11/30 0700 In: 4627.1 [I.V.:4627.1] Out: 1150 [Urine:1150] Intake/Output this shift: Total I/O In: 400 [P.O.:400] Out: -   Lab Results: Recent Labs    05/18/20 0729  WBC 10.2  HGB 11.4*  HCT 41.9  PLT 219   BMET Recent Labs    05/18/20 0729 05/19/20 0620 05/20/20 0439  NA 147* 143 145  K 4.6 4.4 4.3  CL 104 101 99  CO2 38* 38* 40*  GLUCOSE 147* 183* 138*  BUN 25* 23 18  CREATININE 1.17 1.13 0.91  CALCIUM 8.1* 8.2* 8.2*    Studies/Results: DG Abd 1 View  Result Date: 05/18/2020 CLINICAL DATA:  Ileus EXAM: ABDOMEN - 1 VIEW COMPARISON:  05/17/2020 FINDINGS: Prominent gaseous distension of the sigmoid colon is similar to slightly progressed compared to the prior radiograph. Moderate volume of stool persist throughout the colon. Nondilated loops of small bowel. No gross free intraperitoneal air on supine imaging. Similar lumbar spondylosis. IMPRESSION: Prominent gaseous distension of the  sigmoid colon is similar to slightly progressed compared to the prior radiograph. Moderate volume of stool persist throughout the colon. Electronically Signed   By: Duanne Guess D.O.   On: 05/18/2020 11:15   DG Abd Portable 2V  Result Date: 05/19/2020 CLINICAL DATA:  ABD distention. Limited Hx due to Pts difficulty with speech post stroke. Hx HTN, stroke, peg tube, nonsmoker EXAM: PORTABLE ABDOMEN - 2 VIEW COMPARISON:  05/18/2020 and earlier exams.  CT, 05/10/2020. FINDINGS: Distended sigmoid colon with moderate increased stool. This is similar to the prior exam. No other bowel dilation. IMPRESSION: 1. No significant change the previous day's study. Persistent sigmoid colon distention with moderate increased stool. No convincing bowel obstruction. Electronically Signed   By: Amie Portland M.D.   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   Rectal tube no longer in place. Due to worsening distension of colon, neostigmine was administered over night between 11/28-11/29 with no significant change in on repeat DG abdomen yesterday. Yesterday, MiraLAX was increased to BID, dulcolax suppository added, and tap water enemas ordered q8 hours. Notably, patient still only received MiraLAX x1 yesterday as he refused first  dose. Patient had a small BM yesterday passing 2 balls of stool per nursing staff. Continues to have significant abdominal distension with minimal TTP in RUQ on exam today but denies any abdominal pain in general and is tolerating his diet well.  Per notes,  brother desires to hold off on colonic decompression unless absolutely necessary.   Plan: Repeat abdominal x-ray today.  Continue MiraLAX BID Continue dulcolax suppository nightly Tap water enemas again today q8 hours.  Frequent turning. May end up needing colonic decompression.  Further recommendations to follow x-ray.     LOS: 9 days    05/20/2020, 10:34 AM   Ermalinda Memos, [facility]  Addendum: Reviewed abdominal x-ray completed today.  Continues to have stool throughout much of the colon, most prominent in the right colon, stable.  No appreciable bowel dilation or air-fluid level to suggest bowel obstruction.  Discussed trial of Linzess with Dr. Levon Hedger who agrees that this may be beneficial. Of note, I did speak with nursing staff around 1: 20 p.m. who states patient did have a large BM after his enema today and states his abdomen does seem softer.   Will plan to stop MiraLAX and try Linzess 145 mcg starting today administered in applesauce thickened to honey consistency due to dysphagia 1 diet, honey thick liquids. Will also order repeat DG abdomen for tomorrow.   Ermalinda Memos, PA-C [facility] 1:38 pm

## 2020-05-20 NOTE — Progress Notes (Signed)
Tap water enema given per order. Patient had very large BM from enema. Stool formed and brown. Patient tolerated well with no complaints. GI specialist made aware.

## 2020-05-20 NOTE — Progress Notes (Signed)
  Speech Language Pathology Treatment: Dysphagia  Patient Details Name: [name] [name] MRN: [id number] DOB: [birth year] Today's Date: 05/20/2020 Time: 8295-6213 SLP Time Calculation (min) (ACUTE ONLY): 16 min  Assessment / Plan / Recommendation Clinical Impression  Ongoing diagnostic dysphagia therapy provided targeting trials of HTL and puree textures. Pt was sitting upright in bed and consumed cup sips of HTL and tsp bites of puree. Pt demonstrates wet vocal quality and a delayed/congested sounding cough, however, as previously reported this is baseline for Pt. Recommend continue with recommended diet of D1/puree and HTL. Recommed Pt be sitting upright in bed for all PO, Pt may protest as he prefers to "lay back" while eating, however he typically will comply after being reminded that it is essential Pt be sitting upright secondary to his high risk of aspiration. ST will continue to follow acutely. Thank you.   HPI HPI: [name] [name] is an 74 y.o. 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  He was noted to have a positive blood culture.  He was admitted to the hospital for treatment of his aspiration pneumonia, but he was also noted to have significant abdominal distention and a CT scan of the abdomen revealed an enlarged sigmoid: With possible fecal impaction.  There does not appear to be evidence of pneumatosis.  No volvulus was seen.  A Flexi-Seal rectal tube has been placed with some output.  He does have abnormalities including leukocytosis and elevated lactic acid level. Pt is known to SLP service with last visit for MBSS 06/20/2019 with recommendation for D2 and NTL (see previous report). BSE requested.      SLP Plan    continue to follow acutely       Recommendations  Diet recommendations: Dysphagia 1 (puree);Honey-thick liquid Liquids provided via: Cup Medication Administration: Whole meds with puree Supervision: Staff to assist with self feeding Compensations: Slow rate;Small sips/bites;Multiple dry swallows after each bite/sip Postural Changes and/or Swallow Maneuvers: Seated upright 90 degrees;Upright 30-60 min after meal                Oral Care Recommendations: Oral care BID;Staff/trained caregiver to provide oral care Follow up Recommendations: Skilled Nursing facility SLP Visit Diagnosis: Dysphagia, unspecified (R13.10)       [name] [name] H. Romie Levee, CCC-SLP Speech Language Pathologist    Georgetta Haber 05/20/2020, 10:45 AM

## 2020-05-20 NOTE — Progress Notes (Addendum)
Initial Nutrition Assessment  DOCUMENTATION CODES:   Obesity unspecified  INTERVENTION:  Dysphagia 1 with honey thick liquids per ST evaluation   Magic cup TID with meals, each supplement provides 290 kcal and 9 grams of protein   Provide tray set up and assistance as needed during mealtime  NUTRITION DIAGNOSIS:   Inadequate oral intake related to acute illness (aspiration pneumonia, enlarged sigmoid) as evidenced by energy intake < 75% for > 7 days.  GOAL:  Patient will meet greater than or equal to 90% of their needs  MONITOR:  PO intake, Supplement acceptance, Weight trends, Skin  REASON FOR ASSESSMENT:   LOS    ASSESSMENT: Patient is well known to RD through his residence at [facility]. He presented to ED on 11/20 with shortness of breath, fever as was admitted for aspiration pneumonia. History of CVA-residual right sided weakness,dysphagia, sigmoid volvus. CT of abdomen findings: enlarged sigmoid with possible fecal impaction. Positive blood cultures. NPO status.  11/24- diet advanced Dysphagia 1 -honey thick liquids.  11/25 -distended abdomen, marked tympany. KUB obtained. Low output via Flex-Seal.  11/26- ST following- dysphagia. Assistance with meals. Tapwater enemas. Ogilvie's Syndrome.   11/28-hypernatremia improving- Good urine output, continued distended abdomen.   11/29-Completing antibiotics (meropenem). Creat. 1.13 within baseline range.   11/30- Patient continues with abdominal distention and bowel regimen.Patient is receiving a tapwater enema mid-day.   He is eating better today >75% of breakfast this morning. Meal intake since admission 0-75%. Able to feed himself. Average intake < 75% of needs met during this admission. LOS day 9. RD unable to speak with him during initial attempt. Talked with his nurse.   Stable weight history. Obese BMI at baseline.  Medications reviewed and include: dulcolax, miralax, novolog, lactulose and Resource ThickenUp  Clear.  Labs: BMP Latest Ref Rng & Units 05/20/2020 05/19/2020 05/18/2020  Glucose 70 - 99 mg/dL 138(H) 183(H) 147(H)  BUN 8 - 23 mg/dL 18 23 25(H)  Creatinine 0.61 - 1.24 mg/dL 0.91 1.13 1.17  Sodium 135 - 145 mmol/L 145 143 147(H)  Potassium 3.5 - 5.1 mmol/L 4.3 4.4 4.6  Chloride 98 - 111 mmol/L 99 101 104  CO2 22 - 32 mmol/L 40(H) 38(H) 38(H)  Calcium 8.9 - 10.3 mg/dL 8.2(L) 8.2(L) 8.1(L)     NUTRITION - FOCUSED PHYSICAL EXAM:  Unable to complete Nutrition-Focused physical exam at this time.  Patient eats well and ambulates in wheelchair at baseline. No history of weight loss and has residual hemiparesis related to distant CVA.   Diet Order:   Diet Order            DIET - DYS 1 Room service appropriate? Yes; Fluid consistency: Honey Thick  Diet effective now                 EDUCATION NEEDS:  No education needs have been identified at this time   Skin:  Skin Assessment: Reviewed RN Assessment (coccyx -abrasion)  Last BM:  11/29  Height:   Ht Readings from Last 1 Encounters:  05/10/20 5' 8"  (1.727 m)    Weight:   Wt Readings from Last 1 Encounters:  05/10/20 111.6 kg    Ideal Body Weight:   70 kg  BMI:  Body mass index is 37.41 kg/m.  Estimated Nutritional Needs:   Kcal:  8502-7741  Protein:  105-112 gr  Fluid:  > 2 liters daily   Colman Cater MS,RD,CSG,LDN Pager: Shea Evans

## 2020-05-20 NOTE — Progress Notes (Signed)
Attempted to give patient nighttime miralax.  Patient refused administration of miralax.  Attempted several times to suction patient, patient was very disturbed and angry  by  attempt to suction.Will continue to monitor patient.

## 2020-05-21 ENCOUNTER — Inpatient Hospital Stay (HOSPITAL_COMMUNITY): Payer: Medicare Other

## 2020-05-21 DIAGNOSIS — R140 Abdominal distension (gaseous): Secondary | ICD-10-CM

## 2020-05-21 LAB — GLUCOSE, CAPILLARY
Glucose-Capillary: 114 mg/dL — ABNORMAL HIGH (ref 70–99)
Glucose-Capillary: 119 mg/dL — ABNORMAL HIGH (ref 70–99)
Glucose-Capillary: 203 mg/dL — ABNORMAL HIGH (ref 70–99)
Glucose-Capillary: 176 mg/dL — ABNORMAL HIGH (ref 70–99)
Glucose-Capillary: 119 mg/dL — ABNORMAL HIGH (ref 70–99)

## 2020-05-21 LAB — BASIC METABOLIC PANEL
Anion gap: 8 (ref 5–15)
BUN: 16 mg/dL (ref 8–23)
CO2: 39 mmol/L — ABNORMAL HIGH (ref 22–32)
Calcium: 8.2 mg/dL — ABNORMAL LOW (ref 8.9–10.3)
Chloride: 93 mmol/L — ABNORMAL LOW (ref 98–111)
Creatinine, Ser: 0.9 mg/dL (ref 0.61–1.24)
GFR, Estimated: >60 mL/min (ref >60)
Glucose, Bld: 118 mg/dL — ABNORMAL HIGH (ref 70–99)
Potassium: 3.9 mmol/L (ref 3.5–5.1)
Sodium: 140 mmol/L (ref 135–145)

## 2020-05-21 NOTE — Plan of Care (Signed)

## 2020-05-21 NOTE — Progress Notes (Signed)
Subjective: Resting without distress. Conversant but difficult to understand due to dysarthria. Denies abdominal pain. Per nursing staff, BM overnight after tap water enema. Large BM yesterday afternoon after enema.   Objective: Vital signs in last 24 hours: Temp:  [98.2 F (36.8 C)] 98.2 F (36.8 C) (12/01 0457) Pulse Rate:  [90-91] 91 (12/01 0457) Resp:  [18-20] 20 (12/01 0457) BP: (111-154)/(67-93) 111/67 (12/01 0457) SpO2:  [94 %-100 %] 94 % (12/01 0729) Last BM Date: 05/20/20 General:   Alert, pleasant, no distress  Head:  Normocephalic and atraumatic. Eyes:  No icterus, sclera clear. Conjuctiva pink.  Abdomen:  Bowel sounds present, distended but improved from prior assessment (Monday), soft abdomen, no TTP, umbilical hernia protruding Neurologic:  Unable to assess orientation   Intake/Output from previous day: 11/30 0701 - 12/01 0700 In: 1120 [P.O.:1120] Out: 850 [Urine:850] Intake/Output this shift: No intake/output data recorded.  Lab Results: No results for input(s): WBC, HGB, HCT, PLT in the last 72 hours. BMET Recent Labs    05/19/20 0620 05/20/20 0439 05/21/20 0537  NA 143 145 140  K 4.4 4.3 3.9  CL 101 99 93*  CO2 38* 40* 39*  GLUCOSE 183* 138* 118*  BUN 23 18 16   CREATININE 1.13 0.91 0.90  CALCIUM 8.2* 8.2* 8.2*     Studies/Results: DG Abd 2 Views  Result Date: 05/21/2020 CLINICAL DATA:  Abdominal distension.  Sigmoid volvulus. EXAM: ABDOMEN - 2 VIEW COMPARISON:  May 20, 2020.  May 19, 2020. FINDINGS: Continued presence of dilated sigmoid colon is noted. No small bowel dilatation is noted. There is no evidence of free air. No radio-opaque calculi or other significant radiographic abnormality is seen. IMPRESSION: Continued presence of dilated sigmoid colon. No small bowel dilatation is noted. Electronically Signed   By: May 21, 2020 M.D.   On: 05/21/2020 08:08   DG Abd 2 Views  Result Date: 05/20/2020 CLINICAL DATA:  Abdominal  pain and distension EXAM: ABDOMEN - 2 VIEW COMPARISON:  May 19, 2020 FINDINGS: Supine and upright images obtained. There is stool throughout much of the colon, most pronounced in the right colon, stable. There is no appreciable bowel dilatation or air-fluid level to suggest bowel obstruction. No free air. There are vascular calcifications in the pelvis. There is atelectatic change in the left lung base. IMPRESSION: Extensive stool remains in the right colon. No evident bowel obstruction or free air. Left lung base atelectasis. Electronically Signed   By: May 21, 2020 III M.D.   On: 05/20/2020 10:32   DG Abd Portable 2V  Result Date: 05/19/2020 CLINICAL DATA:  ABD distention. Limited Hx due to Pts difficulty with speech post stroke. Hx HTN, stroke, peg tube, nonsmoker EXAM: PORTABLE ABDOMEN - 2 VIEW COMPARISON:  05/18/2020 and earlier exams.  CT, 05/10/2020. FINDINGS: Distended sigmoid colon with moderate increased stool. This is similar to the prior exam. No other bowel dilation. IMPRESSION: 1. No significant change the previous day's study. Persistent sigmoid colon distention with moderate increased stool. No convincing bowel obstruction. Electronically Signed   By: 05/12/2020 M.D.   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  Neostigmine administered overnight 11/28-11/29. Large BM yesterday afternoon and overnight per nursing staff. Linzess has been added to bowel regimen, with first dose yesterday afternoon, along with continuing Miralax daily,  dulcolax suppository each evening, and serial tap water enemas.   Continued dilated sigmoid colon noted on xray today, but clinically his abdomen appears less distended and softer. Will need to continue optimization of bowel regimen. Hold off on endoscopic decompression at this time. If acute pain, distension, will need CT abdomen.     Plan: Continue Linzess 145 mcg daily Miralax daily Dulcolax suppository in evening Serial tap water enemas for now: may be able to do prn once bowel regimen optimized and responding well CT if acute pain, distended abdomen Follow electrolytes and correct as needed Abdominal xray in am   [name] Mink, PhD, ANP-BC [facility]     LOS: 10 days    05/21/2020, 8:58 AM

## 2020-05-21 NOTE — Progress Notes (Signed)
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 As per report: he was seen in the ED on late evening of 11/19 due to shortness of breath whereby he was requiring more supplemental oxygen and his oxygen was increased from 2 L/min to 3 L/min at the nursing facility, breathing treatment was also provided, buthis O2 sats continuedto be in the 80s,so he was taken to the ED for further evaluation.  In the ED, patient was suspected to have aspiration pneumonia due to his history of dysphagia and prior aspiration pneumonia, breathing treatment (albuterol and Atrovent) and small amount of white and occasionally yellow mucus were suctioned from thenares and throat,Hewas started on IV clindamycin. CT scan done at that time does not reveal any specific abnormalities and patient had no leukocytosis, BNP was normal, respiratory panel was negative for influenza A, B and SARS coronavirus 2,so patient was discharged back to [facility] for a 7-day course of IV clindamycin. Blood culture drawn when patient came to the ED was positive for gram-positive cocci in one of the 2 cultures (possibly contaminated), but based on patient's clinical presentation, he was brought back to the emergency department. In the ED, he was febrile and tachycardic, LABS- leukocytosis, hypernatremia,, hyperglycemia, acute renal failure with BUN to creatinine 41/1.65 (baseline creatinine at 1.0-1.2). Lactic acidosis   2.6>2.2. UA unimpressive for UTI.CT abdomen and pelvis with contrast showed findings consistent with fecal impaction within the distal sigmoid colon with subsequent distal large bowel obstruction. CT angiography of chest with contrast showed no definite evidence of acute PE, though still shows concern for PE. CXR-persistent mild patchy bilateral opacities with possible superimposed acute infiltrate. He was just tired and IV fluids, antibiotics and admitted for further management. Patient is followed by Dr. Lovell SheehanJenkins from surgery. Being managed with supportive care and edema. With ongoing abdominal distention GI was consulted 11/22, recommended Flexi-Seal for decompression.   Assessment & Plan:   Principal Problem:   Severe sepsis (HCC) Active Problems:   DVT (deep venous thrombosis) (HCC)   Chronic pulmonary embolism (HCC)   Constipation   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   Hyperthyroidism   Hyperlipidemia associated with type 2 diabetes mellitus (HCC)   Aspiration pneumonia (HCC)   Hypernatremia   Sepsis secondary to pneumonia   AKI (acute kidney injury) (HCC)   Goals of care, counseling/discussion   Palliative care by specialist   Essential hypertension   Fecal impaction (HCC)   Leukocytosis   Hyperglycemia due to diabetes mellitus (HCC)   Lactic acidosis   Acute on chronic respiratory failure with hypercapnia (HCC)   Abdominal distention   Dilatation of colon   Abdominal distension   Severe sepsis POA likely multifactorial secondary to aspiration pneumonia also with large bowel obstruction-resolved: Vital signs stable, leukocytosis improving.  Initially started on empiric vancomycin/cefepime, blood cultures growing gram-positive cocci, culture demonstrated to be contaminants.  per SSLP okay to start PUREED and HTL diet. Patient has completed antibiotic therapy as on 05/19/20. No fever.  Dilated colon possible obstruction versus  ileus with constipation/history  of chronic constipation ?OGILVIE SYNDROME: Appreciate surgery input, status post lactulose enema.  GI consulted due to ongoing decompression, advised Flexi-Seal for decompression and holding off on flex sigmoidoscopy decompression.  Continue on IV fluid hydration and electrolyte resuscitation.  Goal is for potassium above 4 and magnesium above 2. will follow GI service and general surgery recommendations.  Continue to watch patient's diet tolerance.  Patient abdomen has remained distended despite neostigmine. Per GI increase miralax, continue tap water enema and use dulcolax nightly.  -Abdominal xray in am -Continue on dysphagia 1 diet  Recent blood culture + in aerobic bottle only - staph in BCID 05/10/20 am- f/u culture, which appears to be contaminants.  Vancomycin discontinued on 05/14/2020.  Acute on chronic hypoxic respiratory failure from combination of aspiration pneumonia/abdominal distention: At baseline 2 L, currently needing 4 L nasal cannula.  Continue to monitor respiratory status closely, continue pulmonary support and complete antibiotic therapy (plan is for a total of 8 days meropenem); will complete antibiotics today.    Hypernatremia-resolved: from free water deficit from his bowel obstruction and decreased oral intake.  -Continues to improve -will continue D5W infusion. -Sodium remains at 140 today -Check labs in am  Acute renal failure,baseline creatinine at 1.0-1.2-resolved: Multifactorial from sepsis/bowel obstruction.  Continue IV fluid hydration renal function improving.  Good urine output reported.  Creatinine 0.90 currently.  History of recurrent DVT/PE on Eliquis at baseline.  Cont therapeutic Lovenox until Eliquis can be safely resumed.  Hyperthyroidism  -continue methimazole.  Hyperlipidemia/hypertension: Blood pressure is overall stable.  Oral meds on hold   Stroke/cerebrovascular accident with residual Rt hemiparesis and  dysphagia: Appreciate speech therapy evaluation and recommendations -Continue dysphagia 1 with honey thick liquids has been initiated.  Hyperglycemia due to diabetes mellitus: HbA1c is still 7.9.   -Blood sugar has remained stable despite being on D5W.   -continue sliding scale insulin and follow CBGs..   Morbid obesity with Body mass index is 37.41 kg/m.   -Portion control discussed with patient.  LYY:TKPTW w/ his brother 11/27 and updated on his situation and plan for neostigmine therapy.  Patient's brother is his healthcare partner Alinda Money and in agreement with intervention. Prognosis remains guarded; patient is DNR. He is at risk of decompensation in the setting of severe sepsis, abdominal distention bowel obstruction along with hypoxic respiratory failure and aspiration. Palliative care to follow up outpatient.   DVT prophylaxis:Lovenox therapeutic Code Status: DNR Family Communication: Discussed with brother on phone 12/1 Disposition Plan:  Status is: Inpatient  Remains inpatient appropriate because:Ongoing diagnostic testing needed not appropriate for outpatient work up, IV treatments appropriate due to intensity of illness or inability to take PO and Inpatient level of care appropriate due to severity of illness   Dispo: The patient is from: SNF              Anticipated d/c is to: SNF              Anticipated d/c date is: > 3 days              Patient currently is not medically stable to d/c.  Consultants:   GI  Procedures:   See below  Antimicrobials:  Anti-infectives (From admission, onward)   Start     Dose/Rate Route Frequency Ordered Stop   05/13/20 0900  meropenem (MERREM) 1 g in sodium chloride 0.9 % 100 mL IVPB        1 g 200 mL/hr over 30 Minutes Intravenous Every 8 hours  05/13/20 0808 05/19/20 2306   05/11/20 2200  vancomycin (VANCOREADY) IVPB 1250 mg/250 mL  Status:  Discontinued        1,250 mg 166.7 mL/hr over 90 Minutes Intravenous Every 24 hours  05/11/20 0316 05/14/20 1016   05/11/20 1000  ceFEPIme (MAXIPIME) 2 g in sodium chloride 0.9 % 100 mL IVPB  Status:  Discontinued        2 g 200 mL/hr over 30 Minutes Intravenous Every 12 hours 05/11/20 0316 05/13/20 0808   05/11/20 0300  metroNIDAZOLE (FLAGYL) IVPB 500 mg  Status:  Discontinued        500 mg 100 mL/hr over 60 Minutes Intravenous Every 8 hours 05/11/20 0214 05/13/20 0808   05/10/20 2215  aztreonam (AZACTAM) 2 g in sodium chloride 0.9 % 100 mL IVPB  Status:  Discontinued        2 g 200 mL/hr over 30 Minutes Intravenous  Once 05/10/20 2205 05/10/20 2211   05/10/20 2215  vancomycin (VANCOCIN) IVPB 1000 mg/200 mL premix  Status:  Discontinued        1,000 mg 200 mL/hr over 60 Minutes Intravenous  Once 05/10/20 2205 05/10/20 2207   05/10/20 2215  vancomycin (VANCOREADY) IVPB 2000 mg/400 mL        2,000 mg 200 mL/hr over 120 Minutes Intravenous  Once 05/10/20 2207 05/11/20 0050   05/10/20 2215  ceFEPIme (MAXIPIME) 2 g in sodium chloride 0.9 % 100 mL IVPB        2 g 200 mL/hr over 30 Minutes Intravenous  Once 05/10/20 2211 05/10/20 2328       Subjective: Patient seen and evaluated today with no distress noted this morning no acute issues noted overnight.  He did have a large BM overnight after tapwater enema given.  Objective: Vitals:   05/20/20 1920 05/20/20 2128 05/21/20 0457 05/21/20 0729  BP:  125/80 111/67   Pulse:  90 91   Resp:  18 20   Temp:  98.2 F (36.8 C) 98.2 F (36.8 C)   TempSrc:  Oral    SpO2: 98% 100% 94% 94%  Weight:      Height:        Intake/Output Summary (Last 24 hours) at 05/21/2020 1215 Last data filed at 05/21/2020 4818 Gross per 24 hour  Intake 1082 ml  Output 850 ml  Net 232 ml   Filed Weights   05/10/20 2122  Weight: 111.6 kg    Examination:  General exam: Appears calm and comfortable, obese Respiratory system: Clear to auscultation. Respiratory effort normal.  Currently on 4 L nasal cannula oxygen. Cardiovascular system: S1  & S2 heard, RRR.  Gastrointestinal system: Abdomen is obese Central nervous system: Alert and awake Extremities: No edema Skin: No rashes, lesions or ulcers Psychiatry: Flat affect    Data Reviewed: I have personally reviewed following labs and imaging studies  CBC: Recent Labs  Lab 05/15/20 0506 05/18/20 0729  WBC 10.5 10.2  HGB 12.3* 11.4*  HCT 42.8 41.9  MCV 104.4* 108.8*  PLT 180 219   Basic Metabolic Panel: Recent Labs  Lab 05/15/20 0506 05/17/20 0738 05/18/20 0729 05/19/20 0620 05/20/20 0439 05/21/20 0537  NA 147*  --  147* 143 145 140  K 3.2*  --  4.6 4.4 4.3 3.9  CL 105  --  104 101 99 93*  CO2 35*  --  38* 38* 40* 39*  GLUCOSE 156*  --  147* 183* 138* 118*  BUN 22  --  25* 23  18 16  CREATININE 1.26*  --  1.17 1.13 0.91 0.90  CALCIUM 7.9*  --  8.1* 8.2* 8.2* 8.2*  MG  --  2.5*  --   --   --   --    GFR: Estimated Creatinine Clearance: 87.3 mL/min (by C-G formula based on SCr of 0.9 mg/dL). Liver Function Tests: No results for input(s): AST, ALT, ALKPHOS, BILITOT, PROT, ALBUMIN in the last 168 hours. No results for input(s): LIPASE, AMYLASE in the last 168 hours. No results for input(s): AMMONIA in the last 168 hours. Coagulation Profile: No results for input(s): INR, PROTIME in the last 168 hours. Cardiac Enzymes: No results for input(s): CKTOTAL, CKMB, CKMBINDEX, TROPONINI in the last 168 hours. BNP (last 3 results) No results for input(s): PROBNP in the last 8760 hours. HbA1C: No results for input(s): HGBA1C in the last 72 hours. CBG: Recent Labs  Lab 05/20/20 1957 05/20/20 2349 05/21/20 0453 05/21/20 0759 05/21/20 1143  GLUCAP 168* 123* 114* 119* 203*   Lipid Profile: No results for input(s): CHOL, HDL, LDLCALC, TRIG, CHOLHDL, LDLDIRECT in the last 72 hours. Thyroid Function Tests: No results for input(s): TSH, T4TOTAL, FREET4, T3FREE, THYROIDAB in the last 72 hours. Anemia Panel: No results for input(s): VITAMINB12, FOLATE, FERRITIN,  TIBC, IRON, RETICCTPCT in the last 72 hours. Sepsis Labs: No results for input(s): PROCALCITON, LATICACIDVEN in the last 168 hours.  No results found for this or any previous visit (from the past 240 hour(s)).       Radiology Studies: DG Abd 2 Views  Result Date: 05/21/2020 CLINICAL DATA:  Abdominal distension.  Sigmoid volvulus. EXAM: ABDOMEN - 2 VIEW COMPARISON:  May 20, 2020.  May 19, 2020. FINDINGS: Continued presence of dilated sigmoid colon is noted. No small bowel dilatation is noted. There is no evidence of free air. No radio-opaque calculi or other significant radiographic abnormality is seen. IMPRESSION: Continued presence of dilated sigmoid colon. No small bowel dilatation is noted. Electronically Signed   By: Lupita Raider M.D.   On: 05/21/2020 08:08   DG Abd 2 Views  Result Date: 05/20/2020 CLINICAL DATA:  Abdominal pain and distension EXAM: ABDOMEN - 2 VIEW COMPARISON:  May 19, 2020 FINDINGS: Supine and upright images obtained. There is stool throughout much of the colon, most pronounced in the right colon, stable. There is no appreciable bowel dilatation or air-fluid level to suggest bowel obstruction. No free air. There are vascular calcifications in the pelvis. There is atelectatic change in the left lung base. IMPRESSION: Extensive stool remains in the right colon. No evident bowel obstruction or free air. Left lung base atelectasis. Electronically Signed   By: Bretta Bang III M.D.   On: 05/20/2020 10:32        Scheduled Meds: . bisacodyl  10 mg Rectal QHS  . enoxaparin (LOVENOX) injection  110 mg Subcutaneous Q12H  . feeding supplement (GLUCERNA SHAKE)  237 mL Oral TID BM  . insulin aspart  0-9 Units Subcutaneous Q4H  . ipratropium-albuterol  3 mL Nebulization BID  . linaclotide  145 mcg Oral QAC breakfast  . methimazole  2.5 mg Oral Daily  . polyethylene glycol  17 g Oral Daily   Continuous Infusions: . dextrose 5 % with KCl 20 mEq / L 20  mEq (05/20/20 1400)     LOS: 10 days    Time spent: 35 minutes    Breena Bevacqua Hoover Brunette, DO [facility]  If 7PM-7AM, please contact night-coverage [email or web address] 05/21/2020, 12:15 PM

## 2020-05-21 NOTE — Progress Notes (Signed)
  Speech Language Pathology Treatment:    Patient Details Name: [name] [name] MRN: [id number] DOB: 10/18/1945 Today's Date: 05/21/2020 Time: 7341-9379 SLP Time Calculation (min) (ACUTE ONLY): 17 min  Assessment / Plan / Recommendation Clinical Impression  Ongoing diagnostic dysphagia therapy provided with limited trials of HTL and puree textures. Pt presents today to be at baseline (poor oral control, anterior spillage and consistent wet vocal quality with occasional congested sounding cough), Pt will benefit from ST f/u after d/c upon return to SNF where he resides. Recommend continue with HTL and puree textures and primary treating SLP at SNF to upgrade (Pt was on NTL when he presented to the hospital) as clinically appropriate. There are no further ST needs acutely. ST to sign off at this time. Precautions and recommendations reviewed with RN. Thank you,   HPI HPI: [name] [name] is an 74 y.o. 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  He was noted to have a positive blood culture.  He was admitted to the hospital for treatment of his aspiration pneumonia, but he was also noted to have significant abdominal distention and a CT scan of the abdomen revealed an enlarged sigmoid: With possible fecal impaction.  There does not appear to be evidence of pneumatosis.  No volvulus was seen.  A Flexi-Seal rectal tube has been placed with some output.  He does have abnormalities including leukocytosis and elevated lactic acid level. Pt is known to SLP service with last visit for MBSS 06/20/2019 with recommendation for D2 and NTL (see previous report). BSE requested.      SLP Plan   ST to sign off at this time, con't HTL/D1 recommend SLP  at SNF to upgrade to NTL upon return to SNF as clinically appropriate        Recommendations  Liquids provided via: Cup Medication Administration: Whole meds with puree Supervision: Staff to assist with self feeding Compensations: Slow rate;Small sips/bites;Multiple dry swallows after each bite/sip Postural Changes and/or Swallow Maneuvers: Seated upright 90 degrees;Upright 30-60 min after meal                Oral Care Recommendations: Oral care BID;Staff/trained caregiver to provide oral care Follow up Recommendations: Skilled Nursing facility SLP Visit Diagnosis: Dysphagia, unspecified (R13.10)       Kandyce Dieguez H. Romie Levee, CCC-SLP Speech Language Pathologist    Georgetta Haber 05/21/2020, 2:03 PM

## 2020-05-22 ENCOUNTER — Inpatient Hospital Stay
Admission: RE | Admit: 2020-05-22 | Discharge: 2020-10-19 | Disposition: E | Payer: Medicare Other | Source: Ambulatory Visit | Attending: Internal Medicine | Admitting: Internal Medicine

## 2020-05-22 ENCOUNTER — Telehealth: Payer: Self-pay | Admitting: Gastroenterology

## 2020-05-22 DIAGNOSIS — R140 Abdominal distension (gaseous): Secondary | ICD-10-CM | POA: Diagnosis not present

## 2020-05-22 DIAGNOSIS — I639 Cerebral infarction, unspecified: Secondary | ICD-10-CM | POA: Diagnosis not present

## 2020-05-22 DIAGNOSIS — K5981 Ogilvie syndrome: Secondary | ICD-10-CM | POA: Diagnosis not present

## 2020-05-22 DIAGNOSIS — E785 Hyperlipidemia, unspecified: Secondary | ICD-10-CM | POA: Diagnosis not present

## 2020-05-22 DIAGNOSIS — A419 Sepsis, unspecified organism: Secondary | ICD-10-CM | POA: Diagnosis not present

## 2020-05-22 DIAGNOSIS — E870 Hyperosmolality and hypernatremia: Secondary | ICD-10-CM | POA: Diagnosis not present

## 2020-05-22 DIAGNOSIS — G8191 Hemiplegia, unspecified affecting right dominant side: Secondary | ICD-10-CM | POA: Diagnosis not present

## 2020-05-22 DIAGNOSIS — K5939 Other megacolon: Secondary | ICD-10-CM | POA: Diagnosis not present

## 2020-05-22 DIAGNOSIS — I700 Atherosclerosis of aorta: Secondary | ICD-10-CM | POA: Diagnosis not present

## 2020-05-22 DIAGNOSIS — J449 Chronic obstructive pulmonary disease, unspecified: Secondary | ICD-10-CM | POA: Diagnosis not present

## 2020-05-22 DIAGNOSIS — R6520 Severe sepsis without septic shock: Secondary | ICD-10-CM | POA: Diagnosis not present

## 2020-05-22 DIAGNOSIS — M24541 Contracture, right hand: Secondary | ICD-10-CM | POA: Diagnosis not present

## 2020-05-22 DIAGNOSIS — M6281 Muscle weakness (generalized): Secondary | ICD-10-CM | POA: Diagnosis not present

## 2020-05-22 DIAGNOSIS — Z7901 Long term (current) use of anticoagulants: Secondary | ICD-10-CM | POA: Diagnosis not present

## 2020-05-22 DIAGNOSIS — I679 Cerebrovascular disease, unspecified: Secondary | ICD-10-CM | POA: Diagnosis not present

## 2020-05-22 DIAGNOSIS — R1312 Dysphagia, oropharyngeal phase: Secondary | ICD-10-CM | POA: Diagnosis not present

## 2020-05-22 DIAGNOSIS — Z741 Need for assistance with personal care: Secondary | ICD-10-CM | POA: Diagnosis not present

## 2020-05-22 DIAGNOSIS — J410 Simple chronic bronchitis: Secondary | ICD-10-CM | POA: Diagnosis not present

## 2020-05-22 DIAGNOSIS — D72829 Elevated white blood cell count, unspecified: Secondary | ICD-10-CM | POA: Diagnosis not present

## 2020-05-22 DIAGNOSIS — Z431 Encounter for attention to gastrostomy: Secondary | ICD-10-CM | POA: Diagnosis not present

## 2020-05-22 DIAGNOSIS — E0590 Thyrotoxicosis, unspecified without thyrotoxic crisis or storm: Secondary | ICD-10-CM | POA: Diagnosis not present

## 2020-05-22 DIAGNOSIS — K5641 Fecal impaction: Secondary | ICD-10-CM | POA: Diagnosis not present

## 2020-05-22 DIAGNOSIS — Z515 Encounter for palliative care: Secondary | ICD-10-CM | POA: Diagnosis not present

## 2020-05-22 DIAGNOSIS — I69391 Dysphagia following cerebral infarction: Secondary | ICD-10-CM | POA: Diagnosis not present

## 2020-05-22 DIAGNOSIS — K562 Volvulus: Secondary | ICD-10-CM | POA: Diagnosis not present

## 2020-05-22 DIAGNOSIS — K6389 Other specified diseases of intestine: Secondary | ICD-10-CM | POA: Diagnosis not present

## 2020-05-22 DIAGNOSIS — E1149 Type 2 diabetes mellitus with other diabetic neurological complication: Secondary | ICD-10-CM | POA: Diagnosis not present

## 2020-05-22 DIAGNOSIS — J690 Pneumonitis due to inhalation of food and vomit: Secondary | ICD-10-CM | POA: Diagnosis not present

## 2020-05-22 DIAGNOSIS — E058 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: Secondary | ICD-10-CM | POA: Diagnosis not present

## 2020-05-22 DIAGNOSIS — Z9981 Dependence on supplemental oxygen: Secondary | ICD-10-CM | POA: Diagnosis not present

## 2020-05-22 DIAGNOSIS — E1165 Type 2 diabetes mellitus with hyperglycemia: Secondary | ICD-10-CM | POA: Diagnosis not present

## 2020-05-22 DIAGNOSIS — I69351 Hemiplegia and hemiparesis following cerebral infarction affecting right dominant side: Secondary | ICD-10-CM | POA: Diagnosis not present

## 2020-05-22 DIAGNOSIS — K5900 Constipation, unspecified: Secondary | ICD-10-CM | POA: Diagnosis not present

## 2020-05-22 DIAGNOSIS — I2782 Chronic pulmonary embolism: Secondary | ICD-10-CM | POA: Diagnosis not present

## 2020-05-22 DIAGNOSIS — I69328 Other speech and language deficits following cerebral infarction: Secondary | ICD-10-CM | POA: Diagnosis not present

## 2020-05-22 DIAGNOSIS — I82502 Chronic embolism and thrombosis of unspecified deep veins of left lower extremity: Secondary | ICD-10-CM | POA: Diagnosis not present

## 2020-05-22 DIAGNOSIS — M4186 Other forms of scoliosis, lumbar region: Secondary | ICD-10-CM | POA: Diagnosis not present

## 2020-05-22 DIAGNOSIS — H179 Unspecified corneal scar and opacity: Secondary | ICD-10-CM | POA: Diagnosis not present

## 2020-05-22 DIAGNOSIS — I10 Essential (primary) hypertension: Secondary | ICD-10-CM | POA: Diagnosis not present

## 2020-05-22 DIAGNOSIS — E1169 Type 2 diabetes mellitus with other specified complication: Secondary | ICD-10-CM | POA: Diagnosis not present

## 2020-05-22 DIAGNOSIS — Z1159 Encounter for screening for other viral diseases: Secondary | ICD-10-CM | POA: Diagnosis not present

## 2020-05-22 DIAGNOSIS — M160 Bilateral primary osteoarthritis of hip: Secondary | ICD-10-CM | POA: Diagnosis not present

## 2020-05-22 DIAGNOSIS — Z993 Dependence on wheelchair: Secondary | ICD-10-CM | POA: Diagnosis not present

## 2020-05-22 LAB — BASIC METABOLIC PANEL
Anion gap: 5 (ref 5–15)
BUN: 15 mg/dL (ref 8–23)
CO2: 42 mmol/L — ABNORMAL HIGH (ref 22–32)
Calcium: 8.1 mg/dL — ABNORMAL LOW (ref 8.9–10.3)
Chloride: 93 mmol/L — ABNORMAL LOW (ref 98–111)
Creatinine, Ser: 0.9 mg/dL (ref 0.61–1.24)
GFR, Estimated: >60 mL/min (ref >60)
Glucose, Bld: 111 mg/dL — ABNORMAL HIGH (ref 70–99)
Potassium: 3.5 mmol/L (ref 3.5–5.1)
Sodium: 140 mmol/L (ref 135–145)

## 2020-05-22 LAB — CBC
HCT: 38.6 % — ABNORMAL LOW (ref 39.0–52.0)
Hemoglobin: 11.4 g/dL — ABNORMAL LOW (ref 13.0–17.0)
MCH: 30.3 pg (ref 26.0–34.0)
MCHC: 29.5 g/dL — ABNORMAL LOW (ref 30.0–36.0)
MCV: 102.7 fL — ABNORMAL HIGH (ref 80.0–100.0)
Platelets: 284 10*3/uL (ref 150–400)
RBC: 3.76 MIL/uL — ABNORMAL LOW (ref 4.22–5.81)
RDW: 13.8 % (ref 11.5–15.5)
WBC: 7.6 10*3/uL (ref 4.0–10.5)
nRBC: 0 % (ref 0.0–0.2)

## 2020-05-22 LAB — GLUCOSE, CAPILLARY
Glucose-Capillary: 119 mg/dL — ABNORMAL HIGH (ref 70–99)
Glucose-Capillary: 122 mg/dL — ABNORMAL HIGH (ref 70–99)
Glucose-Capillary: 130 mg/dL — ABNORMAL HIGH (ref 70–99)
Glucose-Capillary: 133 mg/dL — ABNORMAL HIGH (ref 70–99)
Glucose-Capillary: 159 mg/dL — ABNORMAL HIGH (ref 70–99)
Glucose-Capillary: 92 mg/dL (ref 70–99)

## 2020-05-22 LAB — RESP PANEL BY RT-PCR (FLU A&B, COVID) ARPGX2
Influenza A by PCR: NEGATIVE
Influenza B by PCR: NEGATIVE
SARS Coronavirus 2 by RT PCR: NEGATIVE

## 2020-05-22 LAB — MAGNESIUM: Magnesium: 2 mg/dL (ref 1.7–2.4)

## 2020-05-22 MED ORDER — LINACLOTIDE 145 MCG PO CAPS: 145.0000 ug | ORAL_CAPSULE | Freq: Every day | ORAL | 0 refills | Status: DC

## 2020-05-22 MED ORDER — GLUCERNA SHAKE PO LIQD
237.0000 mL | Freq: Three times a day (TID) | ORAL | 3 refills | Status: DC
Start: 2020-05-22 — End: 2020-05-30

## 2020-05-22 MED ORDER — POLYETHYLENE GLYCOL 3350 17 G PO PACK
17.0000 g | PACK | Freq: Every day | ORAL | 0 refills | Status: DC
Start: 2020-05-23 — End: 2020-05-30

## 2020-05-22 NOTE — Progress Notes (Signed)
discharge instructions given to nurse Kathie Rhodes at [facility]. Answer all questions at this time. All belongings sent with patient.

## 2020-05-22 NOTE — Plan of Care (Signed)
  Problem: Education: Goal: Knowledge of General Education information will improve Description: Including pain rating scale, medication(s)/side effects and non-pharmacologic comfort measures 05/21/2020 1446 by Cameron Ali, RN Outcome: Completed/Met 06/17/2020 0857 by Cameron Ali, RN Outcome: Progressing   Problem: Health Behavior/Discharge Planning: Goal: Ability to manage health-related needs will improve 06/02/2020 1446 by Cameron Ali, RN Outcome: Completed/Met 05/21/2020 0857 by Cameron Ali, RN Outcome: Progressing   Problem: Clinical Measurements: Goal: Ability to maintain clinical measurements within normal limits will improve 06/01/2020 1446 by Cameron Ali, RN Outcome: Completed/Met 06/16/2020 0857 by Cameron Ali, RN Outcome: Progressing Goal: Will remain free from infection 06/13/2020 1446 by Cameron Ali, RN Outcome: Completed/Met 06/02/2020 0857 by Cameron Ali, RN Outcome: Progressing Goal: Diagnostic test results will improve 05/21/2020 1446 by Cameron Ali, RN Outcome: Completed/Met 05/23/2020 0857 by Cameron Ali, RN Outcome: Progressing Goal: Respiratory complications will improve 06/14/2020 1446 by Cameron Ali, RN Outcome: Completed/Met 06/07/2020 0857 by Cameron Ali, RN Outcome: Progressing Goal: Cardiovascular complication will be avoided 06/05/2020 1446 by Cameron Ali, RN Outcome: Completed/Met 06/17/2020 0857 by Cameron Ali, RN Outcome: Progressing   Problem: Activity: Goal: Risk for activity intolerance will decrease 06/05/2020 1446 by Cameron Ali, RN Outcome: Completed/Met 05/27/2020 0857 by Cameron Ali, RN Outcome: Progressing   Problem: Nutrition: Goal: Adequate nutrition will be maintained 06/20/2020 1446 by Cameron Ali, RN Outcome: Completed/Met 06/04/2020 0857 by Cameron Ali, RN Outcome: Progressing   Problem: Coping: Goal: Level of anxiety will  decrease 06/01/2020 1446 by Cameron Ali, RN Outcome: Completed/Met 06/20/2020 0857 by Cameron Ali, RN Outcome: Progressing   Problem: Elimination: Goal: Will not experience complications related to bowel motility 06/13/2020 1446 by Cameron Ali, RN Outcome: Completed/Met 05/29/2020 0857 by Cameron Ali, RN Outcome: Progressing Goal: Will not experience complications related to urinary retention 06/14/2020 1446 by Cameron Ali, RN Outcome: Completed/Met 06/20/2020 0857 by Cameron Ali, RN Outcome: Progressing   Problem: Pain Managment: Goal: General experience of comfort will improve 05/27/2020 1446 by Cameron Ali, RN Outcome: Completed/Met 06/01/2020 0857 by Cameron Ali, RN Outcome: Progressing   Problem: Safety: Goal: Ability to remain free from injury will improve 06/19/2020 1446 by Cameron Ali, RN Outcome: Completed/Met 06/12/2020 0857 by Cameron Ali, RN Outcome: Progressing   Problem: Skin Integrity: Goal: Risk for impaired skin integrity will decrease 06/10/2020 1446 by Cameron Ali, RN Outcome: Completed/Met 05/24/2020 0857 by Cameron Ali, RN Outcome: Progressing

## 2020-05-22 NOTE — Discharge Summary (Signed)
Physician Discharge Summary  LEQUAN [name] [name]:[id number] DOB: Jan 23, 1946 DOA: 05/10/2020  PCP: Sharee Holster, NP  Admit date: 05/10/2020  Discharge date: 05-Jun-2020  Admitted From:SNF  Disposition:  SNF with outpatient Palliative   Recommendations for Outpatient Follow-up:  1. Follow up with PCP in 1-2 weeks 2. Continue on Linzess and MiraLAX daily as recommended by GI 3. Continue tapwater enemas 3 times daily for 1 week and then daily as needed thereafter 4. 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 As per report:he was seen in the ED on late evening of 11/19 due to shortness of breath whereby he was requiring more supplemental oxygen and his oxygen was increased from 2 L/min to 3 L/min at the nursing facility, breathing treatment was also provided, buthis O2 sats continuedto be in the 80s,so he was taken to the ED for further evaluation.  In the ED, patient was suspected to have aspiration pneumonia due to his history of dysphagia and prior aspiration pneumonia, breathing treatment (albuterol and Atrovent) and small amount of white and occasionally yellow mucus were suctioned from thenares and throat,Hewas started on IV clindamycin. CT scan done at that time does not reveal any specific abnormalities and patient had no leukocytosis, BNP was normal,  respiratory panel was negative for influenza A, B and SARS coronavirus 2,so patient was discharged back to [facility] for a 7-day course of IV clindamycin. Blood culture drawn when patient came to the ED was positive for gram-positive cocci in one of the 2 cultures (possibly contaminated), but based on patient's clinical presentation, he was brought back to the emergency department. In the ED, he was febrile and tachycardic, LABS- leukocytosis, hypernatremia,, hyperglycemia, acute renal failurewith BUN to creatinine 41/1.65 (baseline creatinine at 1.0-1.2). Lactic acidosis 2.6>2.2. UA unimpressive for UTI.CT abdomen and pelvis with contrast showed findings consistent withfecal impaction within the distal sigmoid colon with subsequent distal large bowel obstruction. CT angiography of chest with contrast showed no definite evidence of acute PE, though still shows concern for PE. CXR-persistent mild patchy bilateral opacities with possible superimposed acute infiltrate. He was just tired and IV fluids, antibiotics and admitted for further management. Patient is followed by Dr. Lovell Sheehan from surgery. Being managed with supportive care and edema. With ongoing abdominal distention GI was consulted 11/22, recommended Flexi-Seal for decompression.  -Patient was admitted with severe sepsis present on admission that was multifactorial in the setting of large bowel obstruction with aspiration pneumonia.  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  He also required some pyridostigmine during the course of the stay.  He is now having bowel movements with laxation regimen with MiraLAX and Linzess as ordered as well as frequent tapwater enemas.  He is able to tolerate dysphagia 1 diet as well.  He has completed his treatment for his aspiration pneumonia and is otherwise in  stable condition for  discharge.  He overall appears to have very poor prognosis and is a long-term care resident at [facility] nursing center and has palliative care in the outpatient setting.  Goals of care were discussed with family members and he will remain DNR.  No other acute events noted throughout the course of the stay.  Discharge Diagnoses:  Principal Problem:   Severe sepsis (HCC) Active Problems:   DVT (deep venous thrombosis) (HCC)   Chronic pulmonary embolism (HCC)   Constipation   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   Hyperthyroidism   Hyperlipidemia associated with type 2 diabetes mellitus (HCC)   Aspiration pneumonia (HCC)   Hypernatremia   Sepsis secondary to pneumonia   AKI (acute kidney injury) (HCC)   Goals of care, counseling/discussion   Palliative care by specialist   Essential hypertension   Fecal impaction (HCC)   Leukocytosis   Hyperglycemia due to diabetes mellitus (HCC)   Lactic acidosis   Acute on chronic respiratory failure with hypercapnia (HCC)   Abdominal distention   Dilatation of colon   Abdominal distension  Principal discharge diagnosis: Severe sepsis present on admission with aspiration pneumonia in the setting of large bowel obstruction related to Ogilvie syndrome.  Discharge Instructions  Discharge Instructions    Diet - low sodium heart healthy   Complete by: As directed    Increase activity slowly   Complete by: As directed      Allergies as of 06/17/2020      Reactions   Penicillin G    Penicillins    Has patient had a PCN reaction causing immediate rash, facial/tongue/throat swelling, SOB or lightheadedness with hypotension: unknown Has patient had a PCN reaction causing severe rash involving mucus membranes or skin necrosis: unknown Has patient had a PCN reaction that required hospitalization: unknown Has patient had a PCN reaction occurring within the last 10 years: unknown If all of the  above answers are "NO", then may proceed with Cephalosporin use. 5/3 Tolerated Cefepime x 1 dose in ED.      Medication List    STOP taking these medications   atorvastatin 10 MG tablet Commonly known as: LIPITOR   clindamycin 300 MG capsule Commonly known as: CLEOCIN   clindamycin 600 mg in dextrose 5 % 100 mL   labetalol 200 MG tablet Commonly known as: NORMODYNE   lactulose 10 GM/15ML solution Commonly known as: CHRONULAC   lisinopril 2.5 MG tablet Commonly known as: ZESTRIL   sennosides 8.8 MG/5ML syrup Commonly known as: SENOKOT     TAKE these medications   acetaminophen 325 MG tablet Commonly known as: TYLENOL Take 650 mg by mouth every 6 (six) hours as needed for moderate pain.   atropine 1 % ophthalmic solution Place 1 drop under the tongue every 6 (six) hours as needed (excessive oral secretions).   Basaglar KwikPen 100 UNIT/ML Inject 31 Units into the skin at bedtime.   bisacodyl 10 MG suppository Commonly known as: DULCOLAX Place 1 suppository (10 mg total) rectally at bedtime.   carboxymethylcellulose 0.5 % Soln Commonly known as: REFRESH PLUS Place 1 drop into the right eye in the morning, at noon, in the evening, and at bedtime.   Eliquis 5 MG Tabs tablet Generic drug: apixaban Place 5 mg into feeding tube 2 (two) times daily. For dvt/pe/cva po   erythromycin ophthalmic ointment Place 1 application into the right eye 2 (two) times daily. Also apply to right eye - dx corneal scar - continue until next ophthalmology  visit At Bedtime   feeding supplement (GLUCERNA SHAKE) Liqd Take 237 mLs by mouth 3 (three) times daily between meals.   ipratropium-albuterol 0.5-2.5 (3) MG/3ML Soln Commonly known as: DUONEB Take 3 mLs by nebulization every 6 (six) hours.   linaclotide 145 MCG Caps capsule Commonly known as: LINZESS Take 1 capsule (145 mcg total) by mouth daily before breakfast. Start taking on: May 23, 2020   loratadine 10 MG  tablet Commonly known as: CLARITIN Take 10 mg by mouth daily.   methimazole 5 MG tablet Commonly known as: TAPAZOLE Take 2.5 mg by mouth daily.   NON FORMULARY Diet- Liquids: Nectar Thick Liquids  Diet:Dysphagia 1 Puree   NovoLOG FlexPen 100 UNIT/ML FlexPen Generic drug: insulin aspart Inject 15 Units into the skin 2 (two) times daily with a meal. With Lunch and Supper   OXYGEN Inhale 3 L into the lungs continuous. Every shift to maintain satuation >90%.   polyethylene glycol 17 g packet Commonly known as: MIRALAX / GLYCOLAX Take 17 g by mouth daily. Start taking on: May 23, 2020   vitamin A & D ointment Apply 1 application topically as needed for dry skin. To left heel qshift for prevention.       Contact information for follow-up providers    Sharee HolsterGreen, Deborah S, NP Follow up in 1 week(s).   Specialty: Geriatric Medicine Contact information: [address] [place] [address] [phone]            Contact information for after-discharge care    Destination    [facility]erred [facility] .   Service: Skilled Nursing Contact information: [address] [id number] [phone]                 Allergies  Allergen Reactions  . Penicillin G   . Penicillins     Has patient had a PCN reaction causing immediate rash, facial/tongue/throat swelling, SOB or lightheadedness with hypotension: unknown Has patient had a PCN reaction causing severe rash involving mucus membranes or skin necrosis: unknown Has patient had a PCN reaction that required hospitalization: unknown Has patient had a PCN reaction occurring within the last 10 years: unknown If all of the above answers are "NO", then may proceed with Cephalosporin use. 5/3 Tolerated Cefepime x 1 dose in ED.     Consultations:  GI  Palliative   Procedures/Studies: DG Chest 1 View  Result Date: 05/10/2020 CLINICAL DATA:  Abdominal distension and  positive blood cultures. EXAM: CHEST  1 VIEW COMPARISON:  May 10, 2020 (12:04 a.m.) FINDINGS: Decreased lung volumes are noted which is likely secondary to the degree of patient inspiration. Mild patchy opacities are again seen within the bilateral lung bases and along the lateral aspect of the mid right lung. This is unchanged in appearance when compared to the prior exam. The small pleural effusions seen on the prior study are not clearly identified on the current exam. The cardiac silhouette is unchanged in appearance. The visualized skeletal structures are unremarkable. IMPRESSION: Persistent mild patchy bilateral opacities. These are nonspecific and may be, in part, chronic in nature, however, superimposed acute infiltrate cannot be excluded. Electronically Signed   By: Aram Candelahaddeus  Houston M.D.   On: 05/10/2020 22:19   DG Abd 1 View  Result Date: 05/18/2020 CLINICAL DATA:  Ileus EXAM: ABDOMEN - 1 VIEW COMPARISON:  05/17/2020 FINDINGS: Prominent gaseous distension of the sigmoid colon is similar to slightly progressed compared to the prior radiograph. Moderate volume of  stool persist throughout the colon. Nondilated loops of small bowel. No gross free intraperitoneal air on supine imaging. Similar lumbar spondylosis. IMPRESSION: Prominent gaseous distension of the sigmoid colon is similar to slightly progressed compared to the prior radiograph. Moderate volume of stool persist throughout the colon. Electronically Signed   By: Duanne Guess D.O.   On: 05/18/2020 11:15   DG Abd 1 View  Result Date: 05/17/2020 CLINICAL DATA:  Ileus status post enema EXAM: ABDOMEN - 1 VIEW COMPARISON:  05/16/2020, 05/15/2020 FINDINGS: Gaseous distension of the sigmoid colon is decreased compared to prior radiograph. Moderate volume of colonic stool, decreased. Nondilated loops of small bowel are seen. No gross free intraperitoneal air on supine view. Similar lumbar spondylosis. IMPRESSION: Interval improvement in the  degree of sigmoid gaseous distension. Moderate volume of colonic stool, decreased from prior. Electronically Signed   By: Duanne Guess D.O.   On: 05/17/2020 11:12   DG Abd 1 View  Result Date: 05/16/2020 CLINICAL DATA:  Ileus. EXAM: ABDOMEN - 1 VIEW COMPARISON:  05/15/2020.  CT 05/10/2020. FINDINGS: Persistent severe distention of the sigmoid colon noted. Sigmoid colonic distention may have progressed from prior exam. Persistent large amount of stool noted throughout the colon. No free air identified. Hemidiaphragms incompletely visualized. Degenerative change in scoliosis lumbar spine. Degenerative changes both hips. Aortoiliac atherosclerotic vascular calcification. IMPRESSION: Persistent severe distention of the sigmoid colon noted. Sigmoid colonic distention may have progressed from prior exam. Persistent large amount of stool noted throughout the colon. No free air identified. Electronically Signed   By: Maisie Fus  Register   On: 05/16/2020 13:47   CT Angio Chest PE W/Cm &/Or Wo Cm  Result Date: 05/10/2020 CLINICAL DATA:  Shortness of breath with elevated D-dimer and hypoxia. Evaluate for pulmonary embolism. EXAM: CT ANGIOGRAPHY CHEST WITH CONTRAST TECHNIQUE: Multidetector CT imaging of the chest was performed using the standard protocol during bolus administration of intravenous contrast. Multiplanar CT image reconstructions and MIPs were obtained to evaluate the vascular anatomy. CONTRAST:  OMNIPAQUE IOHEXOL 350 MG/ML SOLN COMPARISON:  CT chest 09/08/2005 and CT abdomen/pelvis 05/09/2019 FINDINGS: Patient unable to communicate, move or speak or reply to questions as patient was restrained for the exam. Cardiovascular: Mild-to-moderate cardiomegaly is present. Moderate calcified plaque over the 3 vessel coronary arteries. Ectasia of the ascending thoracic aorta measuring 3.9 cm in AP diameter. Calcified plaque over the thoracic aorta. Pulmonary arterial system is well opacified. There is  moderate artifact over the posterior field of view of the thorax right worse than left. There is mild low-attenuation over proximal right lower lobar artery which is likely due to this artifact although embolus is possible. Also mild low-attenuation over a posterior right upper lobe segmental pulmonary artery likely artifactual. No central or left-sided pulmonary emboli visualized. Mediastinum/Nodes: No evidence of hilar adenopathy. Single 1.3 cm right subcarinal lymph node likely reactive. Remaining mediastinal structures are unremarkable. Lungs/Pleura: Mild pleural calcification over the right apex. Lungs are adequately inflated and demonstrate bibasilar posterior consolidation likely atelectasis without significant change from the prior exam from 2020. No effusion. Severe AP narrowing of the trachea at the level of the carina with severe narrowing of the proximal mainstem bronchi bilaterally. Upper Abdomen: Calcified plaque over the abdominal aorta. Prominent ovoid air collection over the anterior aspect of the mid abdomen only partially visualized and unchanged from the previous CT abdomen representing a prominent segment of sigmoid colon. Musculoskeletal: Bilateral gynecomastia. Moderate degenerative changes of the spine. Review of the MIP images confirms the above  findings. IMPRESSION: 1. No definite evidence of acute pulmonary embolism. Low-attenuation over the proximal right lower lobar artery as well as a posterior right upper lobe segmental pulmonary artery likely artifactual. Concern still remains for pulmonary embolism, follow-up CT a in 24-48 hours may be helpful. 2. Stable bibasilar consolidation likely atelectasis and not significant changed from the prior exam. Severe AP narrowing of the trachea at the level of the carina with severe narrowing of the proximal mainstem bronchi bilaterally. 1.3 cm right subcarinal lymph node likely reactive. 3. Cardiomegaly and atherosclerotic coronary artery disease.  4. Aortic atherosclerosis. Ectasia of the ascending thoracic aorta measuring 3.9 cm in AP diameter. Recommend annual imaging followup by CTA or MRA. This recommendation follows 2010 ACCF/AHA/AATS/ACR/ASA/SCA/SCAI/SIR/STS/SVM Guidelines for the Diagnosis and Management of Patients with Thoracic Aortic Disease. Circulation.2010; 121: Z610-R604. Aortic aneurysm NOS (ICD10-I71.9) Aortic Atherosclerosis (ICD10-I70.0). Electronically Signed   By: Elberta Fortis M.D.   On: 05/10/2020 08:24   CT Abdomen Pelvis W Contrast  Result Date: 05/10/2020 CLINICAL DATA:  Abdominal distension and positive blood cultures. EXAM: CT ABDOMEN AND PELVIS WITH CONTRAST TECHNIQUE: Multidetector CT imaging of the abdomen and pelvis was performed using the standard protocol following bolus administration of intravenous contrast. CONTRAST:  75mL OMNIPAQUE IOHEXOL 300 MG/ML  SOLN COMPARISON:  May 09, 2019 FINDINGS: Lower chest: Stable moderate severity areas of consolidation are seen along the posterior aspect of the bilateral lung bases. Mild surrounding atelectatic changes are noted. Hepatobiliary: No focal liver abnormality is seen. Ill-defined subcentimeter gallstones are seen within the lumen of a contracted gallbladder. There is no evidence of biliary dilatation. Pancreas: Unremarkable. No pancreatic ductal dilatation or surrounding inflammatory changes. Spleen: Normal in size without focal abnormality. Adrenals/Urinary Tract: Adrenal glands are unremarkable. Kidneys are normal in size, without renal calculi or hydronephrosis. Multiple ill-defined subcentimeter bilateral renal cysts are seen. Contrast is seen throughout the urinary bladder lumen. Stomach/Bowel: Stomach is within normal limits. Appendix appears normal. A large amount of stool is seen within the distal aspect of a redundant sigmoid colon. Dilatation of the distal sigmoid colon is seen proximal to this region. The numerous noninflamed diverticula are seen within the  sigmoid colon. Vascular/Lymphatic: There is marked severity calcification of the abdominal aorta and bilateral common iliac arteries, without evidence of aneurysmal dilatation. No enlarged abdominal or pelvic lymph nodes. Reproductive: Prostate is unremarkable. Other: No abdominal wall hernia or abnormality. No abdominopelvic ascites. Musculoskeletal: Multilevel degenerative changes seen throughout the lumbar spine. IMPRESSION: 1. Findings consistent with fecal impaction within the distal sigmoid colon with subsequent distal large bowel obstruction. 2. Stable moderate severity areas of bibasilar consolidation. 3. Cholelithiasis. 4. Sigmoid diverticulosis. Aortic Atherosclerosis (ICD10-I70.0). Electronically Signed   By: Aram Candela M.D.   On: 05/10/2020 23:20   DG Chest Portable 1 View  Result Date: 05/10/2020 CLINICAL DATA:  Shortness of breath. EXAM: PORTABLE CHEST 1 VIEW COMPARISON:  May 19, 2019 FINDINGS: Mild patchy opacities are seen within the bilateral lung bases and lateral aspect of the mid right lung. These areas are mildly increased in severity when compared to the prior study. Small bilateral pleural effusions are seen. No pneumothorax is identified. The cardiac silhouette is mildly enlarged. The visualized skeletal structures are unremarkable. IMPRESSION: 1. Mild patchy bilateral lung opacities which are, in part, chronic in nature. A superimposed component of atelectasis and/or infiltrate cannot be excluded. Correlation with chest CT is recommended. 2. Small bilateral pleural effusions. Electronically Signed   By: Aram Candela M.D.   On: 05/10/2020 00:48  DG Abd 2 Views  Result Date: 05/21/2020 CLINICAL DATA:  Abdominal distension.  Sigmoid volvulus. EXAM: ABDOMEN - 2 VIEW COMPARISON:  May 20, 2020.  May 19, 2020. FINDINGS: Continued presence of dilated sigmoid colon is noted. No small bowel dilatation is noted. There is no evidence of free air. No radio-opaque  calculi or other significant radiographic abnormality is seen. IMPRESSION: Continued presence of dilated sigmoid colon. No small bowel dilatation is noted. Electronically Signed   By: Lupita Raider M.D.   On: 05/21/2020 08:08   DG Abd 2 Views  Result Date: 05/20/2020 CLINICAL DATA:  Abdominal pain and distension EXAM: ABDOMEN - 2 VIEW COMPARISON:  May 19, 2020 FINDINGS: Supine and upright images obtained. There is stool throughout much of the colon, most pronounced in the right colon, stable. There is no appreciable bowel dilatation or air-fluid level to suggest bowel obstruction. No free air. There are vascular calcifications in the pelvis. There is atelectatic change in the left lung base. IMPRESSION: Extensive stool remains in the right colon. No evident bowel obstruction or free air. Left lung base atelectasis. Electronically Signed   By: Bretta Bang III M.D.   On: 05/20/2020 10:32   DG Abd Portable 1V  Result Date: 05/15/2020 CLINICAL DATA:  Abdominal distension. EXAM: PORTABLE ABDOMEN - 1 VIEW COMPARISON:  Abdominal radiograph 05/13/2020 FINDINGS: Persistent marked gaseous distension of the sigmoid colon. Stool throughout the colon. No definite dilated loops of small bowel identified. Supine evaluation limited for the detection of free intraperitoneal air. Lumbar spine degenerative changes. IMPRESSION: Persistent marked gaseous distension of the sigmoid colon. Electronically Signed   By: Annia Belt M.D.   On: 05/15/2020 08:07   DG Abd Portable 1V  Result Date: 05/13/2020 CLINICAL DATA:  Abdominal distension EXAM: PORTABLE ABDOMEN - 1 VIEW COMPARISON:  05/12/2020 FINDINGS: Persistent marked gaseous distension of the sigmoid colon. Moderate volume of stool projects throughout the colon, slightly improved from prior. No dilated loops of small bowel. No gross free intraperitoneal air on supine imaging. Degenerative changes of the spine. IMPRESSION: 1. Persistent marked gaseous  distension of the sigmoid colon. 2. Moderate volume of stool throughout the colon, slightly improved from prior. Electronically Signed   By: Duanne Guess D.O.   On: 05/13/2020 08:24   DG Abd Portable 1V  Result Date: 05/12/2020 CLINICAL DATA:  Bowel obstruction. EXAM: PORTABLE ABDOMEN - 1 VIEW COMPARISON:  CT abdomen and pelvis 05/10/2020 FINDINGS: There is a moderate amount of stool in the colon. There is persistent gaseous distension of redundant sigmoid colon which has mildly increased from the recent CT. No dilated small bowel loops are seen. There is mild lumbar levoscoliosis. IMPRESSION: Mildly increased gaseous dilatation of redundant sigmoid colon. Moderate colonic stool burden. Electronically Signed   By: Sebastian Ache M.D.   On: 05/12/2020 08:40   DG Abd Portable 2V  Result Date: 06-17-2020 CLINICAL DATA:  Abdominal distention. EXAM: PORTABLE ABDOMEN - 2 VIEW COMPARISON:  05/21/2020.  05/20/2020.  CT 05/10/2020. FINDINGS: Persistent sigmoid colonic distention again noted. Remainder of the colon is nondistended. No small bowel distention. Stool noted throughout the colon. No free air identified. Persistent bibasilar atelectasis/infiltrates. Degenerative changes scoliosis lumbar spine. Degenerative changes both hips. Aortoiliac atherosclerotic vascular calcification. IMPRESSION: Persistent sigmoid colonic distention again noted. Remainder of the colon is nondistended. Stool noted throughout the colon. No small bowel distention. No free air. Exam unchanged from prior exam. Electronically Signed   By: Maisie Fus  Register   On: 2020-06-17 06:16  DG Abd Portable 2V  Result Date: 05/19/2020 CLINICAL DATA:  ABD distention. Limited Hx due to Pts difficulty with speech post stroke. Hx HTN, stroke, peg tube, nonsmoker EXAM: PORTABLE ABDOMEN - 2 VIEW COMPARISON:  05/18/2020 and earlier exams.  CT, 05/10/2020. FINDINGS: Distended sigmoid colon with moderate increased stool. This is similar to the prior  exam. No other bowel dilation. IMPRESSION: 1. No significant change the previous day's study. Persistent sigmoid colon distention with moderate increased stool. No convincing bowel obstruction. Electronically Signed   By: Amie Portland M.D.   On: 05/19/2020 12:13     Discharge Exam: Vitals:   06/20/20 0419 06-20-20 0728  BP: 111/66   Pulse: 89   Resp: 19   Temp: 99.3 F (37.4 C)   SpO2: 100% 98%   Vitals:   05/21/20 1921 05/21/20 2009 06/20/2020 0419 06-20-2020 0728  BP:  (!) 151/88 111/66   Pulse:  93 89   Resp:  18 19   Temp:  99.6 F (37.6 C) 99.3 F (37.4 C)   TempSrc:      SpO2: 96% 97% 100% 98%  Weight:      Height:        General: Pt is alert, awake, not in acute distress Cardiovascular: RRR, S1/S2 +, no rubs, no gallops Respiratory: CTA bilaterally, no wheezing, no rhonchi, on 5L [place] Abdominal: Soft, NT, moderately distended, bowel sounds + Extremities: no edema, no cyanosis    The results of significant diagnostics from this hospitalization (including imaging, microbiology, ancillary and laboratory) are listed below for reference.     Microbiology: No results found for this or any previous visit (from the past 240 hour(s)).   Labs: BNP (last 3 results) Recent Labs    05/10/20 0121  BNP 40.0   Basic Metabolic Panel: Recent Labs  Lab 05/17/20 0738 05/18/20 0729 05/19/20 0620 05/20/20 0439 05/21/20 0537 06/20/20 0503  NA  --  147* 143 145 140 140  K  --  4.6 4.4 4.3 3.9 3.5  CL  --  104 101 99 93* 93*  CO2  --  38* 38* 40* 39* 42*  GLUCOSE  --  147* 183* 138* 118* 111*  BUN  --  25* 23 18 16 15   CREATININE  --  1.17 1.13 0.91 0.90 0.90  CALCIUM  --  8.1* 8.2* 8.2* 8.2* 8.1*  MG 2.5*  --   --   --   --  2.0   Liver Function Tests: No results for input(s): AST, ALT, ALKPHOS, BILITOT, PROT, ALBUMIN in the last 168 hours. No results for input(s): LIPASE, AMYLASE in the last 168 hours. No results for input(s): AMMONIA in the last 168  hours. CBC: Recent Labs  Lab 05/18/20 0729 2020/06/20 0503  WBC 10.2 7.6  HGB 11.4* 11.4*  HCT 41.9 38.6*  MCV 108.8* 102.7*  PLT 219 284   Cardiac Enzymes: No results for input(s): CKTOTAL, CKMB, CKMBINDEX, TROPONINI in the last 168 hours. BNP: Invalid input(s): POCBNP CBG: Recent Labs  Lab 05/21/20 2010 06/20/2020 0028 2020-06-20 0420 06-20-2020 0725 2020/06/20 1101  GLUCAP 119* 133* 122* 92 130*   D-Dimer No results for input(s): DDIMER in the last 72 hours. Hgb A1c No results for input(s): HGBA1C in the last 72 hours. Lipid Profile No results for input(s): CHOL, HDL, LDLCALC, TRIG, CHOLHDL, LDLDIRECT in the last 72 hours. Thyroid function studies No results for input(s): TSH, T4TOTAL, T3FREE, THYROIDAB in the last 72 hours.  Invalid input(s): FREET3 Anemia work up  No results for input(s): VITAMINB12, FOLATE, FERRITIN, TIBC, IRON, RETICCTPCT in the last 72 hours. Urinalysis    Component Value Date/Time   COLORURINE YELLOW 05/10/2020 2202   APPEARANCEUR CLEAR 05/10/2020 2202   LABSPEC 1.028 05/10/2020 2202   PHURINE 5.0 05/10/2020 2202   GLUCOSEU NEGATIVE 05/10/2020 2202   HGBUR SMALL (A) 05/10/2020 2202   BILIRUBINUR NEGATIVE 05/10/2020 2202   KETONESUR NEGATIVE 05/10/2020 2202   PROTEINUR 30 (A) 05/10/2020 2202   NITRITE NEGATIVE 05/10/2020 2202   LEUKOCYTESUR NEGATIVE 05/10/2020 2202   Sepsis Labs Invalid input(s): PROCALCITONIN,  WBC,  LACTICIDVEN Microbiology No results found for this or any previous visit (from the past 240 hour(s)).   Time coordinating discharge: 35 minutes  SIGNED:   Erick Blinks, DO [facility] 06/15/2020, 2:08 PM  If 7PM-7AM, please contact night-coverage [email or web address]

## 2020-05-22 NOTE — Plan of Care (Signed)

## 2020-05-22 NOTE — Telephone Encounter (Signed)
Patient being discharged from hospital. Will need hospital follow up for constipation/Olgivie syndrome.

## 2020-05-22 NOTE — Progress Notes (Signed)
Subjective: Patient reports no abdominal pain. No n/v. States he has had some BM today. Feels abdominal distention is about the same. Enjoyed his breakfast.   Objective: Vital signs in last 24 hours: Temp:  [97.6 F (36.4 C)-99.6 F (37.6 C)] 99.3 F (37.4 C) (12/02 0419) Pulse Rate:  [89-98] 89 (12/02 0419) Resp:  [18-20] 19 (12/02 0419) BP: (111-151)/(66-88) 111/66 (12/02 0419) SpO2:  [96 %-100 %] 98 % (12/02 0728) Last BM Date: 05/21/20 General:   Alert,  NAD Head:  Normocephalic and atraumatic. Eyes:  Sclera clear, no icterus.  Abdomen:  Soft, nontender. Distended but soft. Bowel sounds present with slight tympany.    Extremities:  Without clubbing, deformity or edema. Skin:  Intact without significant lesions or rashes. Psych:  Alert and cooperative. Normal mood and affect.  Intake/Output from previous day: 12/01 0701 - 12/02 0700 In: 1045 [P.O.:1045] Out: 1000 [Urine:1000] Intake/Output this shift: Total I/O In: 125 [P.O.:125] Out: 500 [Urine:500]  Lab Results: CBC Recent Labs    04/18/20 0503  WBC 7.6  HGB 11.4*  HCT 38.6*  MCV 102.7*  PLT 284   BMET Recent Labs    05/20/20 0439 05/21/20 0537 04/18/20 0503  NA 145 140 140  K 4.3 3.9 3.5  CL 99 93* 93*  CO2 40* 39* 42*  GLUCOSE 138* 118* 111*  BUN 18 16 15   CREATININE 0.91 0.90 0.90  CALCIUM 8.2* 8.2* 8.1*   LFTs No results for input(s): BILITOT, BILIDIR, IBILI, ALKPHOS, AST, ALT, PROT, ALBUMIN in the last 72 hours. No results for input(s): LIPASE in the last 72 hours. PT/INR No results for input(s): LABPROT, INR in the last 72 hours.    Imaging Studies: DG Chest 1 View  Result Date: 05/10/2020 CLINICAL DATA:  Abdominal distension and positive blood cultures. EXAM: CHEST  1 VIEW COMPARISON:  May 10, 2020 (12:04 a.m.) FINDINGS: Decreased lung volumes are noted which is likely secondary to the degree of patient inspiration. Mild patchy opacities are again seen within the bilateral lung bases  and along the lateral aspect of the mid right lung. This is unchanged in appearance when compared to the prior exam. The small pleural effusions seen on the prior study are not clearly identified on the current exam. The cardiac silhouette is unchanged in appearance. The visualized skeletal structures are unremarkable. IMPRESSION: Persistent mild patchy bilateral opacities. These are nonspecific and may be, in part, chronic in nature, however, superimposed acute infiltrate cannot be excluded. Electronically Signed   By: Aram Candelahaddeus  Houston M.D.   On: 05/10/2020 22:19   DG Abd 1 View  Result Date: 05/18/2020 CLINICAL DATA:  Ileus EXAM: ABDOMEN - 1 VIEW COMPARISON:  05/17/2020 FINDINGS: Prominent gaseous distension of the sigmoid colon is similar to slightly progressed compared to the prior radiograph. Moderate volume of stool persist throughout the colon. Nondilated loops of small bowel. No gross free intraperitoneal air on supine imaging. Similar lumbar spondylosis. IMPRESSION: Prominent gaseous distension of the sigmoid colon is similar to slightly progressed compared to the prior radiograph. Moderate volume of stool persist throughout the colon. Electronically Signed   By: Duanne GuessNicholas  Plundo D.O.   On: 05/18/2020 11:15   DG Abd 1 View  Result Date: 05/17/2020 CLINICAL DATA:  Ileus status post enema EXAM: ABDOMEN - 1 VIEW COMPARISON:  05/16/2020, 05/15/2020 FINDINGS: Gaseous distension of the sigmoid colon is decreased compared to prior radiograph. Moderate volume of colonic stool, decreased. Nondilated loops of small bowel are seen. No gross free intraperitoneal air on  supine view. Similar lumbar spondylosis. IMPRESSION: Interval improvement in the degree of sigmoid gaseous distension. Moderate volume of colonic stool, decreased from prior. Electronically Signed   By: Duanne Guess D.O.   On: 05/17/2020 11:12   DG Abd 1 View  Result Date: 05/16/2020 CLINICAL DATA:  Ileus. EXAM: ABDOMEN - 1 VIEW  COMPARISON:  05/15/2020.  CT 05/10/2020. FINDINGS: Persistent severe distention of the sigmoid colon noted. Sigmoid colonic distention may have progressed from prior exam. Persistent large amount of stool noted throughout the colon. No free air identified. Hemidiaphragms incompletely visualized. Degenerative change in scoliosis lumbar spine. Degenerative changes both hips. Aortoiliac atherosclerotic vascular calcification. IMPRESSION: Persistent severe distention of the sigmoid colon noted. Sigmoid colonic distention may have progressed from prior exam. Persistent large amount of stool noted throughout the colon. No free air identified. Electronically Signed   By: Maisie Fus  Register   On: 05/16/2020 13:47   CT Angio Chest PE W/Cm &/Or Wo Cm  Result Date: 05/10/2020 CLINICAL DATA:  Shortness of breath with elevated D-dimer and hypoxia. Evaluate for pulmonary embolism. EXAM: CT ANGIOGRAPHY CHEST WITH CONTRAST TECHNIQUE: Multidetector CT imaging of the chest was performed using the standard protocol during bolus administration of intravenous contrast. Multiplanar CT image reconstructions and MIPs were obtained to evaluate the vascular anatomy. CONTRAST:  OMNIPAQUE IOHEXOL 350 MG/ML SOLN COMPARISON:  CT chest 09/08/2005 and CT abdomen/pelvis 05/09/2019 FINDINGS: Patient unable to communicate, move or speak or reply to questions as patient was restrained for the exam. Cardiovascular: Mild-to-moderate cardiomegaly is present. Moderate calcified plaque over the 3 vessel coronary arteries. Ectasia of the ascending thoracic aorta measuring 3.9 cm in AP diameter. Calcified plaque over the thoracic aorta. Pulmonary arterial system is well opacified. There is moderate artifact over the posterior field of view of the thorax right worse than left. There is mild low-attenuation over proximal right lower lobar artery which is likely due to this artifact although embolus is possible. Also mild low-attenuation over a  posterior right upper lobe segmental pulmonary artery likely artifactual. No central or left-sided pulmonary emboli visualized. Mediastinum/Nodes: No evidence of hilar adenopathy. Single 1.3 cm right subcarinal lymph node likely reactive. Remaining mediastinal structures are unremarkable. Lungs/Pleura: Mild pleural calcification over the right apex. Lungs are adequately inflated and demonstrate bibasilar posterior consolidation likely atelectasis without significant change from the prior exam from 2020. No effusion. Severe AP narrowing of the trachea at the level of the carina with severe narrowing of the proximal mainstem bronchi bilaterally. Upper Abdomen: Calcified plaque over the abdominal aorta. Prominent ovoid air collection over the anterior aspect of the mid abdomen only partially visualized and unchanged from the previous CT abdomen representing a prominent segment of sigmoid colon. Musculoskeletal: Bilateral gynecomastia. Moderate degenerative changes of the spine. Review of the MIP images confirms the above findings. IMPRESSION: 1. No definite evidence of acute pulmonary embolism. Low-attenuation over the proximal right lower lobar artery as well as a posterior right upper lobe segmental pulmonary artery likely artifactual. Concern still remains for pulmonary embolism, follow-up CT a in 24-48 hours may be helpful. 2. Stable bibasilar consolidation likely atelectasis and not significant changed from the prior exam. Severe AP narrowing of the trachea at the level of the carina with severe narrowing of the proximal mainstem bronchi bilaterally. 1.3 cm right subcarinal lymph node likely reactive. 3. Cardiomegaly and atherosclerotic coronary artery disease. 4. Aortic atherosclerosis. Ectasia of the ascending thoracic aorta measuring 3.9 cm in AP diameter. Recommend annual imaging followup by CTA or MRA.  This recommendation follows 2010 ACCF/AHA/AATS/ACR/ASA/SCA/SCAI/SIR/STS/SVM Guidelines for the Diagnosis and  Management of Patients with Thoracic Aortic Disease. Circulation.2010; 121: Z610-R604. Aortic aneurysm NOS (ICD10-I71.9) Aortic Atherosclerosis (ICD10-I70.0). Electronically Signed   By: Elberta Fortis M.D.   On: 05/10/2020 08:24   CT Abdomen Pelvis W Contrast  Result Date: 05/10/2020 CLINICAL DATA:  Abdominal distension and positive blood cultures. EXAM: CT ABDOMEN AND PELVIS WITH CONTRAST TECHNIQUE: Multidetector CT imaging of the abdomen and pelvis was performed using the standard protocol following bolus administration of intravenous contrast. CONTRAST:  51mL OMNIPAQUE IOHEXOL 300 MG/ML  SOLN COMPARISON:  May 09, 2019 FINDINGS: Lower chest: Stable moderate severity areas of consolidation are seen along the posterior aspect of the bilateral lung bases. Mild surrounding atelectatic changes are noted. Hepatobiliary: No focal liver abnormality is seen. Ill-defined subcentimeter gallstones are seen within the lumen of a contracted gallbladder. There is no evidence of biliary dilatation. Pancreas: Unremarkable. No pancreatic ductal dilatation or surrounding inflammatory changes. Spleen: Normal in size without focal abnormality. Adrenals/Urinary Tract: Adrenal glands are unremarkable. Kidneys are normal in size, without renal calculi or hydronephrosis. Multiple ill-defined subcentimeter bilateral renal cysts are seen. Contrast is seen throughout the urinary bladder lumen. Stomach/Bowel: Stomach is within normal limits. Appendix appears normal. A large amount of stool is seen within the distal aspect of a redundant sigmoid colon. Dilatation of the distal sigmoid colon is seen proximal to this region. The numerous noninflamed diverticula are seen within the sigmoid colon. Vascular/Lymphatic: There is marked severity calcification of the abdominal aorta and bilateral common iliac arteries, without evidence of aneurysmal dilatation. No enlarged abdominal or pelvic lymph nodes. Reproductive: Prostate is  unremarkable. Other: No abdominal wall hernia or abnormality. No abdominopelvic ascites. Musculoskeletal: Multilevel degenerative changes seen throughout the lumbar spine. IMPRESSION: 1. Findings consistent with fecal impaction within the distal sigmoid colon with subsequent distal large bowel obstruction. 2. Stable moderate severity areas of bibasilar consolidation. 3. Cholelithiasis. 4. Sigmoid diverticulosis. Aortic Atherosclerosis (ICD10-I70.0). Electronically Signed   By: Aram Candela M.D.   On: 05/10/2020 23:20   DG Chest Portable 1 View  Result Date: 05/10/2020 CLINICAL DATA:  Shortness of breath. EXAM: PORTABLE CHEST 1 VIEW COMPARISON:  May 19, 2019 FINDINGS: Mild patchy opacities are seen within the bilateral lung bases and lateral aspect of the mid right lung. These areas are mildly increased in severity when compared to the prior study. Small bilateral pleural effusions are seen. No pneumothorax is identified. The cardiac silhouette is mildly enlarged. The visualized skeletal structures are unremarkable. IMPRESSION: 1. Mild patchy bilateral lung opacities which are, in part, chronic in nature. A superimposed component of atelectasis and/or infiltrate cannot be excluded. Correlation with chest CT is recommended. 2. Small bilateral pleural effusions. Electronically Signed   By: Aram Candela M.D.   On: 05/10/2020 00:48   DG Abd 2 Views  Result Date: 05/21/2020 CLINICAL DATA:  Abdominal distension.  Sigmoid volvulus. EXAM: ABDOMEN - 2 VIEW COMPARISON:  May 20, 2020.  May 19, 2020. FINDINGS: Continued presence of dilated sigmoid colon is noted. No small bowel dilatation is noted. There is no evidence of free air. No radio-opaque calculi or other significant radiographic abnormality is seen. IMPRESSION: Continued presence of dilated sigmoid colon. No small bowel dilatation is noted. Electronically Signed   By: Lupita Raider M.D.   On: 05/21/2020 08:08   DG Abd 2  Views  Result Date: 05/20/2020 CLINICAL DATA:  Abdominal pain and distension EXAM: ABDOMEN - 2 VIEW COMPARISON:  May 19, 2020 FINDINGS: Supine and upright images obtained. There is stool throughout much of the colon, most pronounced in the right colon, stable. There is no appreciable bowel dilatation or air-fluid level to suggest bowel obstruction. No free air. There are vascular calcifications in the pelvis. There is atelectatic change in the left lung base. IMPRESSION: Extensive stool remains in the right colon. No evident bowel obstruction or free air. Left lung base atelectasis. Electronically Signed   By: Bretta Bang III M.D.   On: 05/20/2020 10:32   DG Abd Portable 1V  Result Date: 05/15/2020 CLINICAL DATA:  Abdominal distension. EXAM: PORTABLE ABDOMEN - 1 VIEW COMPARISON:  Abdominal radiograph 05/13/2020 FINDINGS: Persistent marked gaseous distension of the sigmoid colon. Stool throughout the colon. No definite dilated loops of small bowel identified. Supine evaluation limited for the detection of free intraperitoneal air. Lumbar spine degenerative changes. IMPRESSION: Persistent marked gaseous distension of the sigmoid colon. Electronically Signed   By: Annia Belt M.D.   On: 05/15/2020 08:07   DG Abd Portable 1V  Result Date: 05/13/2020 CLINICAL DATA:  Abdominal distension EXAM: PORTABLE ABDOMEN - 1 VIEW COMPARISON:  05/12/2020 FINDINGS: Persistent marked gaseous distension of the sigmoid colon. Moderate volume of stool projects throughout the colon, slightly improved from prior. No dilated loops of small bowel. No gross free intraperitoneal air on supine imaging. Degenerative changes of the spine. IMPRESSION: 1. Persistent marked gaseous distension of the sigmoid colon. 2. Moderate volume of stool throughout the colon, slightly improved from prior. Electronically Signed   By: Duanne Guess D.O.   On: 05/13/2020 08:24   DG Abd Portable 1V  Result Date: 05/12/2020 CLINICAL  DATA:  Bowel obstruction. EXAM: PORTABLE ABDOMEN - 1 VIEW COMPARISON:  CT abdomen and pelvis 05/10/2020 FINDINGS: There is a moderate amount of stool in the colon. There is persistent gaseous distension of redundant sigmoid colon which has mildly increased from the recent CT. No dilated small bowel loops are seen. There is mild lumbar levoscoliosis. IMPRESSION: Mildly increased gaseous dilatation of redundant sigmoid colon. Moderate colonic stool burden. Electronically Signed   By: Sebastian Ache M.D.   On: 05/12/2020 08:40   DG Abd Portable 2V  Result Date: 06/13/2020 CLINICAL DATA:  Abdominal distention. EXAM: PORTABLE ABDOMEN - 2 VIEW COMPARISON:  05/21/2020.  05/20/2020.  CT 05/10/2020. FINDINGS: Persistent sigmoid colonic distention again noted. Remainder of the colon is nondistended. No small bowel distention. Stool noted throughout the colon. No free air identified. Persistent bibasilar atelectasis/infiltrates. Degenerative changes scoliosis lumbar spine. Degenerative changes both hips. Aortoiliac atherosclerotic vascular calcification. IMPRESSION: Persistent sigmoid colonic distention again noted. Remainder of the colon is nondistended. Stool noted throughout the colon. No small bowel distention. No free air. Exam unchanged from prior exam. Electronically Signed   By: Maisie Fus  Register   On: 06/13/2020 06:16   DG Abd Portable 2V  Result Date: 05/19/2020 CLINICAL DATA:  ABD distention. Limited Hx due to Pts difficulty with speech post stroke. Hx HTN, stroke, peg tube, nonsmoker EXAM: PORTABLE ABDOMEN - 2 VIEW COMPARISON:  05/18/2020 and earlier exams.  CT, 05/10/2020. FINDINGS: Distended sigmoid colon with moderate increased stool. This is similar to the prior exam. No other bowel dilation. IMPRESSION: 1. No significant change the previous day's study. Persistent sigmoid colon distention with moderate increased stool. No convincing bowel obstruction. Electronically Signed   By: Amie Portland M.D.   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  Neostigmine administered overnight November 28 through November 29. Large bowel movement November 29 and overnight. Continues on Linzess, started November 29, MiraLAX daily, Dulcolax suppository each evening, serial tapwater enemas. Abdomen clinically less distended.    Abdominal x-ray today with persistent sigmoid colon distention, remainder of colon nondistended.  Stool noted throughout the colon.  No small bowel distention.  No free air.  Oropharyngeal dysphagia, followed by speech therapy. Continued honey thickened liquids/dysphagia 1 diet. At risk for aspiration.   Plan: 1. Continue Linzess daily. Consider increase dose to 290 mcg if needed.  2. Maintain normal electrolytes.  3. Continue Miralax 17 grams daily. 4. Dulcolax (bisacodyl) suppository at bedtime.  5. Continue serial tap water enemas.  6. Follow with speech therapist when back to SNF. Previous PEG placed last year but since removed. Ongoing risk for aspiration.  Leanna Battles. Dixon Boos [facility] [phone] 12/2/202111:11 AM     LOS: 11 days

## 2020-05-22 NOTE — Telephone Encounter (Signed)
Please review message from [facility]/Leslie. Patient will need hospital follow up - constipation.

## 2020-05-22 NOTE — TOC Transition Note (Signed)
Transition of Care [facility]) - CM/SW Discharge Note   Patient Details  Name: [name] [name] MRN: [id number] Date of Birth: 31-Dec-1945  [facility] of Care [facility]) CM/SW Contact:  Leitha Bleak, RN Phone Number: 2020-06-17, 2:44 PM   Clinical Narrative:   Patient discharging back to [facility].  TOC called [name] his brother to update him with DC plan. Negative COVID test. Send DC summary in Hub, Kerri provided number for report. RN updated.     Final next level of care: Skilled Nursing Facility Barriers to Discharge: Barriers Resolved   Patient Goals and CMS Choice Patient states their goals for this hospitalization and ongoing recovery are:: Return to [facility] CMS Medicare.gov Compare Post Acute Care list provided to:: Patient Represenative (must comment) Choice offered to / list presented to : NA  Discharge Placement              Patient chooses bed at: [facility] Patient to be transferred to facility by: [facility] staff Name of family member notified: Gabriel Rung - brother Patient and family notified of of transfer: June 17, 2020  Discharge Plan and Services In-house Referral: Clinical Social Work Discharge Planning Services: NA Post Acute Care Choice: Skilled Nursing Facility (Patient is LTC resident of [facility])          DME Arranged: N/A DME Agency: NA      HH Arranged: NA HH Agency: NA     Readmission Risk Interventions Readmission Risk Prevention Plan 05/12/2020 05/23/2019 05/10/2019  Transportation Screening Complete Complete Complete  PCP or Specialist Appt within 3-5 Days - Complete -  HRI or Home Care Consult - Not Complete Not Complete  HRI or Home Care Consult comments - Pt. LTC at [facility] Pt resides in LTC at [facility]  Social Work Consult for Recovery Care Planning/Counseling - Complete -  Palliative Care Screening - Complete -  Medication Review Oceanographer) Complete Complete Complete  PCP or Specialist appointment within 3-5 days of discharge Not Complete - -  PCP/Specialist Appt  Not Complete comments PCP appointment will be set up by [facility] - -  HRI or Home Care Consult Complete - -  SW Recovery Care/Counseling Consult Complete - -  Palliative Care Screening Complete - -  Skilled Nursing Facility Complete - -  Some recent data might be hidden

## 2020-05-22 NOTE — Care Management Important Message (Signed)
Important Message  Patient Details  Name: [name] [name] MRN: [id number] Date of Birth: [birth year]   Medicare Important Message Given:  Yes     Corey Harold June 02, 2020, 2:34 PM

## 2020-05-23 ENCOUNTER — Non-Acute Institutional Stay (SKILLED_NURSING_FACILITY): Payer: Medicare Other | Admitting: Adult Health

## 2020-05-23 ENCOUNTER — Encounter: Payer: Self-pay | Admitting: Adult Health

## 2020-05-23 DIAGNOSIS — I2782 Chronic pulmonary embolism: Secondary | ICD-10-CM | POA: Diagnosis not present

## 2020-05-23 DIAGNOSIS — E0590 Thyrotoxicosis, unspecified without thyrotoxic crisis or storm: Secondary | ICD-10-CM | POA: Diagnosis not present

## 2020-05-23 DIAGNOSIS — IMO0002 Reserved for concepts with insufficient information to code with codable children: Secondary | ICD-10-CM

## 2020-05-23 DIAGNOSIS — I69391 Dysphagia following cerebral infarction: Secondary | ICD-10-CM | POA: Diagnosis not present

## 2020-05-23 DIAGNOSIS — R972 Elevated prostate specific antigen [PSA]: Secondary | ICD-10-CM

## 2020-05-23 DIAGNOSIS — G8191 Hemiplegia, unspecified affecting right dominant side: Secondary | ICD-10-CM

## 2020-05-23 DIAGNOSIS — I700 Atherosclerosis of aorta: Secondary | ICD-10-CM

## 2020-05-23 DIAGNOSIS — I679 Cerebrovascular disease, unspecified: Secondary | ICD-10-CM | POA: Diagnosis not present

## 2020-05-23 DIAGNOSIS — K5641 Fecal impaction: Secondary | ICD-10-CM

## 2020-05-23 DIAGNOSIS — E785 Hyperlipidemia, unspecified: Secondary | ICD-10-CM

## 2020-05-23 DIAGNOSIS — E1165 Type 2 diabetes mellitus with hyperglycemia: Secondary | ICD-10-CM

## 2020-05-23 DIAGNOSIS — J690 Pneumonitis due to inhalation of food and vomit: Secondary | ICD-10-CM

## 2020-05-23 DIAGNOSIS — E1169 Type 2 diabetes mellitus with other specified complication: Secondary | ICD-10-CM

## 2020-05-23 DIAGNOSIS — J410 Simple chronic bronchitis: Secondary | ICD-10-CM

## 2020-05-23 DIAGNOSIS — Z794 Long term (current) use of insulin: Secondary | ICD-10-CM

## 2020-05-23 DIAGNOSIS — I82502 Chronic embolism and thrombosis of unspecified deep veins of left lower extremity: Secondary | ICD-10-CM

## 2020-05-23 DIAGNOSIS — I639 Cerebral infarction, unspecified: Secondary | ICD-10-CM | POA: Diagnosis not present

## 2020-05-23 DIAGNOSIS — E1149 Type 2 diabetes mellitus with other diabetic neurological complication: Secondary | ICD-10-CM

## 2020-05-23 DIAGNOSIS — K562 Volvulus: Secondary | ICD-10-CM | POA: Diagnosis not present

## 2020-05-23 NOTE — Progress Notes (Signed)
Location:    [facility] Room Number: 112-W Place of Service:  SNF (31)   CODE STATUS: DNR  Allergies  Allergen Reactions  . Penicillin G   . Penicillins     Has patient had a PCN reaction causing immediate rash, facial/tongue/throat swelling, SOB or lightheadedness with hypotension: unknown Has patient had a PCN reaction causing severe rash involving mucus membranes or skin necrosis: unknown Has patient had a PCN reaction that required hospitalization: unknown Has patient had a PCN reaction occurring within the last 10 years: unknown If all of the above answers are "NO", then may proceed with Cephalosporin use. 5/3 Tolerated Cefepime x 1 dose in ED.     Chief Complaint  Patient presents with  . Hospitalization Follow-up    Hospital follow-up from 05/10/2020-06/12/2020     HPI:  He is a 74 year old long term resident of this facility with history of cva and dysphagia who has been hospitalized from 05-10-20 through 06/09/2020. He became short of breath and low 02 sats. His cxr did display bilateral opacities. He has one blood culture + which was felt to be contaminate. He did develop large bowel obstruction. He was treated for aspiration pneumonia. He was started on miralax which we have stopped as he is on nectar thick liquids. His abdomen remains distended with hypoactive sounds. He will need fleets enema three times daily for one week.  There are reports of pain present. 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 DVT (deep venous thrombosis) (HCC) 09/26/2012  . Dysphasia   . Fatty liver   . Hemiplegia (HCC)   . Hypertension   . Pneumonia   . Pulmonary embolism (HCC)   . 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(HCC)   . Stroke [facility])     Past Surgical History:  Procedure Laterality Date  . ESOPHAGOGASTRODUODENOSCOPY  (EGD) WITH PROPOFOL N/A 05/15/2019   Procedure: ESOPHAGOGASTRODUODENOSCOPY (EGD) WITH PROPOFOL;  Surgeon: Lucretia Roers, MD;  Location: [facility];  Service: General;  Laterality: N/A;  . FLEXIBLE SIGMOIDOSCOPY N/A 10/21/2016   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with successful decompression, few divertiula at sigmoid  . FLEXIBLE SIGMOIDOSCOPY N/A 10/25/2016   sigmoid volvulus with successful decompression, dilated sigmoid colon.   Marland Kitchen PEG PLACEMENT N/A 05/15/2019   Procedure: PERCUTANEOUS ENDOSCOPIC GASTROSTOMY (PEG) PLACEMENT;  Surgeon: Lucretia Roers, MD;  Location: [facility];  Service: General;  Laterality: N/A;  . unable      Social History   Socioeconomic History  . Marital status: Single    Spouse name: Not on file  . Number of children: Not on file  . Years of education: Not on file  . Highest education level: Not on file  Occupational History  . Not on file  Tobacco Use  . Smoking status: Never Smoker  . Smokeless tobacco: Never Used  Vaping Use  . Vaping Use: Never used  Substance and Sexual Activity  . Alcohol use: No    Alcohol/week: 0.0 standard drinks  . Drug use: No  . Sexual activity: Not on file  Other Topics Concern  . Not on file  Social History Narrative  . Not on file   Social Determinants of Health   Financial Resource Strain:   . Difficulty of Paying Living Expenses: Not on file  Food Insecurity:   . Worried About Programme researcher, broadcasting/film/video  in the Last Year: Not on file  . Ran Out of Food in the Last Year: Not on file  Transportation Needs:   . Lack of Transportation (Medical): Not on file  . Lack of Transportation (Non-Medical): Not on file  Physical Activity:   . Days of Exercise per Week: Not on file  . Minutes of Exercise per Session: Not on file  Stress:   . Feeling of Stress : Not on file  Social Connections:   . Frequency of Communication with Friends and Family: Not on file  . Frequency of Social Gatherings with Friends and Family: Not on file  . Attends  Religious Services: Not on file  . Active Member of Clubs or Organizations: Not on file  . Attends Banker Meetings: Not on file  . Marital Status: Not on file  Intimate Partner Violence:   . Fear of Current or Ex-Partner: Not on file  . Emotionally Abused: Not on file  . Physically Abused: Not on file  . Sexually Abused: Not on file   History reviewed. No pertinent family history.    VITAL SIGNS BP 137/85   Pulse 83   Temp 98 F (36.7 C)   Resp 18   Ht 5' 8.5" (1.74 m)   Wt 246 lb (111.6 kg)   SpO2 92%   BMI 36.86 kg/m   Outpatient Encounter Medications as of 05/23/2020  Medication Sig  . acetaminophen (TYLENOL) 325 MG tablet Take 650 mg by mouth every 6 (six) hours as needed for moderate pain.   Marland Kitchen apixaban (ELIQUIS) 5 MG TABS tablet Place 5 mg into feeding tube 2 (two) times daily. For dvt/pe/cva po  . atropine 1 % ophthalmic solution Place 1 drop under the tongue every 6 (six) hours as needed (excessive oral secretions).   . bisacodyl (DULCOLAX) 10 MG suppository Place 1 suppository (10 mg total) rectally at bedtime.  . carboxymethylcellulose (REFRESH PLUS) 0.5 % SOLN Place 1 drop into the right eye in the morning, at noon, in the evening, and at bedtime.   Marland Kitchen erythromycin ophthalmic ointment Place 1 application into the right eye 2 (two) times daily. Also apply to right eye - dx corneal scar - continue until next ophthalmology visit At Bedtime  . feeding supplement, GLUCERNA SHAKE, (GLUCERNA SHAKE) LIQD Take 237 mLs by mouth 3 (three) times daily between meals.  . insulin aspart (NOVOLOG FLEXPEN) 100 UNIT/ML FlexPen Inject 15 Units into the skin 2 (two) times daily with a meal. With Lunch and Supper  . Insulin Glargine (BASAGLAR KWIKPEN) 100 UNIT/ML SOPN Inject 31 Units into the skin at bedtime.   Marland Kitchen ipratropium-albuterol (DUONEB) 0.5-2.5 (3) MG/3ML SOLN Take 3 mLs by nebulization every 6 (six) hours.   Marland Kitchen linaclotide (LINZESS) 145 MCG CAPS capsule Take 1 capsule  (145 mcg total) by mouth daily before breakfast.  . loratadine (CLARITIN) 10 MG tablet Take 10 mg by mouth daily.  . methimazole (TAPAZOLE) 5 MG tablet Take 2.5 mg by mouth daily.  . NON FORMULARY Diet- Liquids: Nectar Thick Liquids  Diet:Dysphagia 1 Puree  . OXYGEN Inhale 3 L into the lungs continuous. Every shift to maintain satuation >90%.  . polyethylene glycol (MIRALAX / GLYCOLAX) 17 g packet Take 17 g by mouth daily.  . Sodium Phosphates (FLEET ENEMA RE) Place 1 enema rectally every 8 (eight) hours.  . Vitamins A & D (VITAMIN A & D) ointment Apply 1 application topically as needed for dry skin. To left heel qshift for prevention.  No facility-administered encounter medications on file as of 05/23/2020.     SIGNIFICANT DIAGNOSTIC EXAMS  PREVIOUS;   05-09-19: ct of abdomen and pelvis:  1. Moderately large amount of stool in the distal sigmoid colon and rectum. Moderate gaseous distension of the more proximal sigmoid colon without evidence of volvulus or bowel obstruction. 2. Bibasilar lung consolidation concerning for pneumonia. 3. Aortic Atherosclerosis    TODAY   05-10-20: chest x-ray:  1. Mild patchy bilateral lung opacities which are, in part, chronic in nature. A superimposed component of atelectasis and/or infiltrate cannot be excluded. Correlation with chest CT is recommended. 2. Small bilateral pleural effusions.  05-10-20 ct angio of chest:  1. No definite evidence of acute pulmonary embolism. Low-attenuation over the proximal right lower lobar artery as well as a posterior right upper lobe segmental pulmonary artery likely artifactual. Concern still remains for pulmonary embolism, . 2. Stable bibasilar consolidation likely atelectasis and not significant changed from the prior exam. Severe AP narrowing of the trachea at the level of the carina with severe narrowing of the proximal mainstem bronchi bilaterally. 1.3 cm right subcarinal lymph node likely reactive. 3.  Cardiomegaly and atherosclerotic coronary artery disease. 4. Aortic atherosclerosis. Ectasia of the ascending thoracic aorta measuring 3.9 cm in AP diameter  05-10-20: ct of abdomen and pelvis:  1. Findings consistent with fecal impaction within the distal sigmoid colon with subsequent distal large bowel obstruction. 2. Stable moderate severity areas of bibasilar consolidation. 3. Cholelithiasis. 4. Sigmoid diverticulosis. Aortic Atherosclerosis  05-21-20: KUB:  Persistent sigmoid colonic distention again noted. Remainder of the colon is nondistended. Stool noted throughout the colon. No small bowel distention. No free air. Exam unchanged from prior exam.     LABS REVIEWED: PREVIOUS   07-04-19 glucose 99; bun 31; creat 1.19 ;k+ 3.8; na++ 143; ca 8.8 chol 109; ldl 68 trig 57; hdl 30; urine micro-albumin 33.8 07-05-19: tsh 1.257 free T4: 0.66  10-11-19: hgb a1c 7.2   11-05-19: tsh 2.340 01-31-20: wbc 8.3; hgb 13.2; hct 43.7; mcv 100.9 plt 271; glucose 122; bun 24; creat 1.10 ;k+ 4.0; na++ 141; ca 8.8 liver norm albumin 3.1 hgb a1c 7.9; chol 138; ldl 93; trig 57; hdl 34   TODAY  05-08-20: hgb a1c 7.9; PSA  10.21 05-10-20: wbc 9.6; hgb 12.4; hct 43.6; mcv 106.3 plt 235; glucose 294; bun 36; creat 1.45; k+ 4.8; na++ 145; ca 8.7 liver normal albumin 3.2 urine culture: no growth: blood culture: staphylococcus capitis 05-12-20: wbc 15.7; hgb 11.5; hct 41.2; mcv 107.9 plt 191; glucose 175; bun 23; creat 1.25; k+ 3.1; na++ 147 ca 7.8 liver normal albumin 2.8 05-18-20: wbc 10.2; hgb 11.4; hct 41.9; mcv 108.8 plt 219; glucose 147; bun 25; creat 1.17; k+ 4.6; na++ 147; ca 8.1  05-24-2020: wbc 7.6; hgb 11.4; hct 38.6; mcv 102.7; plt 284; glucose 111; bun 15; creat 0.90; k+ 3.5; na++ 140; ca 8.1 mag 2.0     Review of Systems  Unable to perform ROS: Dementia (unable to participate )    Physical Exam Constitutional:      General: He is not in acute distress.    Appearance: He is well-developed. He is  obese. He is not diaphoretic.  Neck:     Thyroid: No thyromegaly.  Cardiovascular:     Rate and Rhythm: Normal rate and regular rhythm.     Pulses: Normal pulses.     Heart sounds: Normal heart sounds.  Pulmonary:     Effort: Pulmonary effort is  normal. No respiratory distress.     Breath sounds: Wheezing and rhonchi present.     Comments: 02 dependent  Abdominal:     General: There is distension.     Palpations: Abdomen is soft.     Tenderness: There is no abdominal tenderness.     Comments: Bowel sounds hypoactive all 4 quads   Musculoskeletal:     Cervical back: Neck supple.     Right lower leg: No edema.     Left lower leg: No edema.     Comments: Right hemiplegia    Lymphadenopathy:     Cervical: No cervical adenopathy.  Skin:    General: Skin is warm and dry.  Neurological:     Mental Status: He is alert. Mental status is at baseline.  Psychiatric:        Mood and Affect: Mood normal.        ASSESSMENT/ PLAN:   TODAY:  1. Sigmoid volvulus/ large bowel obstruction: is without change abdomin remains distended: will continue dulolax rectal nightly fleets enema three times daily for one week; and will continue linzess 145 mcg daily will stop miralax due to him being on nectar thick liquids.   2. Aspiration pneumonia: is stable has completed abt will continue to monitor his status.   3. 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 Aortic atherosclerosis (CT 05-09-19) is stable will monitor is on long term eliquis lipitor and blood pressure medications.   5. Simple bronchitis: is stable is 02 dependent; will continue claritin 10 mg daily is unable to use inhalers. Will continue douneb every 6 hours   6. Hypertension associated with type 2 diabetes mellitus: is stable b/p 137/85 will continue to monitor his status; his beta blocker and ace were stopped in the hospital  7.  Cerebrovascular accident (CVA) due to other mechanism/hemiplegis of right dominant side due to cerebrovascular disease: is stable will continue eliquis 5 mg twice daily   8. Hyperlipidemia associated with type 2 diabetes mellitus: is stable LDL 93 will continue to monitor his status.   9. Hyperthyroidism: is stable tsh 2.340 free t4: 0.66 will continue tapazole 2.5 mg daily   10. Deep vein thrombosis: (DVT) of left lower extremity unspecified vein/other pulmonary embolism without acute cor pulmonale: is stable is on long term eliquis 5 mg twice daily   11. 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 Elevated PSA: is slightly improved: 10.21 will monitor  13.     11. Elevated PSA; is without change will monitor   12. Dementia without behavioral disturbance unspecified dementia type: is without change will monitor              MD is aware of resident's narcotic use and is in agreement with current plan of care. We will attempt to wean resident as appropriate.  Synthia Innocenteborah Adelyne Marchese NP [facility]  Contact [phone] Monday through Friday 8am- 5pm  After hours call [phone]

## 2020-05-26 ENCOUNTER — Non-Acute Institutional Stay (SKILLED_NURSING_FACILITY): Payer: Medicare Other | Admitting: Internal Medicine

## 2020-05-26 ENCOUNTER — Encounter: Payer: Self-pay | Admitting: Internal Medicine

## 2020-05-26 DIAGNOSIS — J690 Pneumonitis due to inhalation of food and vomit: Secondary | ICD-10-CM

## 2020-05-26 DIAGNOSIS — Z515 Encounter for palliative care: Secondary | ICD-10-CM | POA: Diagnosis not present

## 2020-05-26 DIAGNOSIS — K5939 Other megacolon: Secondary | ICD-10-CM | POA: Diagnosis not present

## 2020-05-26 NOTE — Progress Notes (Signed)
NURSING HOME LOCATION:  [facility] ROOM NUMBER:  25 [id number]  CODE STATUS: DNR  PCP:  Synthia Innocent , NP  This is a nursing facility follow up for Nursing Facility readmission within 30 days  Interim medical record and care since last [facility] visit was updated with review of diagnostic studies and change in clinical status since last visit were documented.  HPI: He was hospitalized 11/20-12/28/2021, readmitted with severe sepsis in the setting of large bowel obstruction and aspiration pneumonia.   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  GI recommended Flexi-Seal for decompression as well as ongoing IV hydration and electrolyte imbalance correction.  He intermittently required pyridostigmine during the hospitalization.  Bowel status improved with the laxative regimen. GI recommended continuing Linzess and MiraLAX daily.  He was to have tapwater enemas 3 times daily for 1 week and then daily as needed thereafter.  Dysphagia 1 diet was to be continued. He completed a full course of antibiotics for the aspiration pneumonia. Palliative care was recommended as prognosis is extremely poor.  PMH includes history of stroke with residual right-sided hemapheresis, history recurrent DVT/PTE, history of sigmoid volvulus, fatty liver, diabetes with neurologic complications, essential hypertension, dyslipidemia, and history of thyrotoxicosis. He has had multiple GI procedures as well as a PEG placement.  Review of systems: His responses were garbled and undiscernible.  Review of systems could not be completed.  Staff states he is basically unchanged from his prehospitalization status.  Physical exam:  Pertinent or positive findings: He has pattern alopecia mainly over the crown.  His hair is long and disheveled; moustache unkempt.  He is hard of hearing.  Postoperative changes are suggested in the left eye with narrowing.  He is wearing nasal oxygen.  He appears to  have an edentulous maxilla but exam was limited.  The mandibular teeth are markedly coated.  There was a mucoid purplish material at the  L angle of the mouth and on the chin.  Staff reports that this is part of his pured diet or meds.  Heart sounds are distant and irregular.  He has diffuse homogenous low-grade rhonchi in all lung fields.  Abdomen is markedly distended.  Dorsalis pedis pulses are stronger than the posterior tibial pulses.  Toenails are deformed. Clubbing of nail beds suggested in L hand. The feet are in booties.  He clinically has essentially no range of motion of the lower extremities.  Strength to opposition in the left upper extremity was poor.  The right upper extremity is flexed across the upper abdomen/lower chest with the hand fisted.  Testing strength in the left upper extremity was the only command he was able to follow. L hand is edematous. Shallow ulcer L hand between thumb & index finger.  General appearance: no acute distress, increased work of breathing is present.   Lymphatic: No lymphadenopathy about the head, neck, axilla. Eyes: There is no scleral icterus. Ears:  External ear exam shows no significant lesions or deformities.   Nose:  External nasal examination shows no deformity or inflammation. Nasal mucosa are pink and moist without lesions, exudates Neck:  No  masses, tenderness noted.    Heart:  No gallop, murmur, click, rub .  Lungs:  without wheezes, rales, rubs. GU: Deferred  Extremities:  No cyanosis  Neurologic exam :Balance, Rhomberg, finger to nose testing could not be completed due to clinical state Skin: Warm & dry w/o tenting. No significant rash.  See summary under each active  problem in the Problem List with associated updated therapeutic plan

## 2020-05-26 NOTE — Assessment & Plan Note (Signed)
Because of his dysphagia 1, nectar thick diet; MiraLAX was discontinued.

## 2020-05-26 NOTE — Assessment & Plan Note (Signed)
As noted in the discharge summary, his prognosis is profoundly poor and DNR status is most appropriate.  Palliative care will continue at the SNF.

## 2020-05-26 NOTE — Patient Instructions (Signed)
See assessment and plan under each diagnosis in the problem list and acutely for this visit

## 2020-05-26 NOTE — Assessment & Plan Note (Signed)
Speech Therapy to follow @ SNF Dysphagia 1, nectar thick diet.

## 2020-05-30 ENCOUNTER — Encounter: Payer: Self-pay | Admitting: Adult Health

## 2020-05-30 ENCOUNTER — Non-Acute Institutional Stay (SKILLED_NURSING_FACILITY): Payer: Medicare Other | Admitting: Adult Health

## 2020-05-30 DIAGNOSIS — I700 Atherosclerosis of aorta: Secondary | ICD-10-CM

## 2020-05-30 DIAGNOSIS — K5939 Other megacolon: Secondary | ICD-10-CM | POA: Diagnosis not present

## 2020-05-30 DIAGNOSIS — I69391 Dysphagia following cerebral infarction: Secondary | ICD-10-CM

## 2020-05-30 DIAGNOSIS — K562 Volvulus: Secondary | ICD-10-CM | POA: Diagnosis not present

## 2020-05-30 NOTE — Progress Notes (Signed)
Location:  [facility] Room Number: 112/W Place of Service:  SNF (31)   CODE STATUS: DNR  Allergies  Allergen Reactions   Penicillin G    Penicillins     Has patient had a PCN reaction causing immediate rash, facial/tongue/throat swelling, SOB or lightheadedness with hypotension: unknown Has patient had a PCN reaction causing severe rash involving mucus membranes or skin necrosis: unknown Has patient had a PCN reaction that required hospitalization: unknown Has patient had a PCN reaction occurring within the last 10 years: unknown If all of the above answers are "NO", then may proceed with Cephalosporin use. 5/3 Tolerated Cefepime x 1 dose in ED.     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 He continues on thickened liquids; chronically aspirates. There are no reports of constipation; no reports of insomnia.   Past Medical History:  Diagnosis Date   Acute respiratory failure with hypoxia (HCC)    Per records at [facility] EMR system, [employer]    Constipation    Per records at [facility] EMR system, [employer]    Dependence on supplemental oxygen    Per records at [facility] EMR system, [employer]    DVT (deep venous thrombosis) (HCC) 09/26/2012   Dysphasia    Elevated WBC count    Per records at [facility] EMR system, [employer]    Fatty liver    Fever, unspecified    Per records at [facility] EMR system, [employer]    Hemiplegia [facility])    Hemorrhoids    Per records at  [facility] EMR system, [employer]    Hyperlipidemia    Per records at [facility] EMR system, [employer]    Hyperosmolality and hypernatremia    Per records at [facility] EMR system, [employer]    Hypertension    Lack of coordination    Per records at [facility] EMR system, [employer]    Muscle weakness    Per records at [facility] EMR system, [employer]    Nasal congestion    Per records at [facility] EMR system, [employer]    Need for assistance with personal care    Per records at [facility] EMR system, [employer]    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    Per records at [facility] EMR system, [employer]    Pneumonia    Pulmonary embolism (HCC)    Sepsis, unspecified organism [facility])    Per records at [facility] EMR system, [employer]    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[facility]    Stroke [facility])    Type 2 diabetes mellitus with hyperglycemia (HCC)    Per records at [facility] EMR system, [employer]    Unspecified corneal scar and opacity    Per records at [facility] EMR system, [employer]    UTI (urinary tract infection)    Per records at [facility] EMR system, [employer]  Past Surgical History:  Procedure Laterality Date   ESOPHAGOGASTRODUODENOSCOPY (EGD) WITH PROPOFOL N/A 05/15/2019   Procedure: ESOPHAGOGASTRODUODENOSCOPY (EGD) WITH PROPOFOL;  Surgeon: Lucretia Roers, MD;  Location: [facility];  Service: General;  Laterality: N/A;   FLEXIBLE SIGMOIDOSCOPY N/A 10/21/2016   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with successful decompression, few divertiula at sigmoid   FLEXIBLE SIGMOIDOSCOPY N/A 10/25/2016   sigmoid volvulus with successful decompression, dilated sigmoid colon.    PEG PLACEMENT N/A 05/15/2019   Procedure: PERCUTANEOUS ENDOSCOPIC GASTROSTOMY (PEG) PLACEMENT;  Surgeon: Lucretia Roers, MD;  Location: [facility];  Service: General;  Laterality: N/A;   unable      Social History   Socioeconomic History    Marital status: Single    Spouse name: Not on file   Number of children: Not on file   Years of education: Not on file   Highest education level: Not on file  Occupational History   Not on file  Tobacco Use   Smoking status: Never Smoker   Smokeless tobacco: Never Used  Vaping Use   Vaping Use: Never used  Substance and Sexual Activity   Alcohol use: No    Alcohol/week: 0.0 standard drinks   Drug use: No   Sexual activity: Not on file  Other Topics Concern   Not on file  Social History Narrative   Not on file   Social Determinants of Health   Financial Resource Strain: Not on file  Food Insecurity: Not on file  Transportation Needs: Not on file  Physical Activity: Not on file  Stress: Not on file  Social Connections: Not on file  Intimate Partner Violence: Not on file   History reviewed. No pertinent family history.    VITAL SIGNS BP 130/61    Pulse (!) 54    Temp 98.3 F (36.8 C)    Resp 18    Ht 5' 8.5" (1.74 m)    Wt 245 lb (111.1 kg)    SpO2 93%    BMI 36.71 kg/m   Outpatient Encounter Medications as of 05/30/2020  Medication Sig   acetaminophen (TYLENOL) 325 MG tablet Take 650 mg by mouth every 6 (six) hours as needed for moderate pain.    apixaban (ELIQUIS) 5 MG TABS tablet Place 5 mg into feeding tube 2 (two) times daily. For dvt/pe/cva po   atropine 1 % ophthalmic solution Place 1 drop under the tongue every 6 (six) hours as needed (excessive oral secretions).    bisacodyl (DULCOLAX) 10 MG suppository Place 1 suppository (10 mg total) rectally at bedtime.   carboxymethylcellulose (REFRESH PLUS) 0.5 % SOLN Place 1 drop into the right eye in the morning, at noon, in the evening, and at bedtime.    erythromycin ophthalmic ointment Place 1 application into the right eye 2 (two) times daily. Also apply to right eye - dx corneal scar - continue until next ophthalmology visit At Bedtime   insulin aspart (NOVOLOG FLEXPEN) 100 UNIT/ML FlexPen  Inject 15 Units into the skin 2 (two) times daily with a meal. With Lunch and Supper   Insulin Glargine (BASAGLAR KWIKPEN) 100 UNIT/ML SOPN Inject 31 Units into the skin at bedtime.    ipratropium-albuterol (DUONEB) 0.5-2.5 (3) MG/3ML SOLN Take 3 mLs by nebulization every 6 (six) hours.    linaclotide (LINZESS) 145 MCG CAPS capsule Take 1 capsule (145 mcg total) by mouth daily before breakfast.   loratadine (CLARITIN) 10 MG tablet Take 10 mg by mouth daily.   methimazole (TAPAZOLE) 5 MG tablet Take  2.5 mg by mouth daily.   NON FORMULARY Diet- Liquids: Nectar Thick Liquids  Diet:Dysphagia 1 Puree   OXYGEN Inhale 3 L into the lungs continuous. Every shift to maintain satuation >90%.   Sodium Phosphates (FLEET ENEMA RE) Place 1 application rectally daily as needed.   Vitamins A & D (VITAMIN A & D) ointment Apply 1 application topically in the morning, at noon, and at bedtime. To bilateral heels   [DISCONTINUED] feeding supplement, GLUCERNA SHAKE, (GLUCERNA SHAKE) LIQD Take 237 mLs by mouth 3 (three) times daily between meals.   [DISCONTINUED] polyethylene glycol (MIRALAX / GLYCOLAX) 17 g packet Take 17 g by mouth daily.   No facility-administered encounter medications on file as of 05/30/2020.     SIGNIFICANT DIAGNOSTIC EXAMS  PREVIOUS;   05-09-19: ct of abdomen and pelvis:  1. Moderately large amount of stool in the distal sigmoid colon and rectum. Moderate gaseous distension of the more proximal sigmoid colon without evidence of volvulus or bowel obstruction. 2. Bibasilar lung consolidation concerning for pneumonia. 3. Aortic Atherosclerosis    05-10-20: chest x-ray:  1. Mild patchy bilateral lung opacities which are, in part, chronic in nature. A superimposed component of atelectasis and/or infiltrate cannot be excluded. Correlation with chest CT is recommended. 2. Small bilateral pleural effusions.  05-10-20 ct angio of chest:  1. No definite evidence of acute pulmonary  embolism. Low-attenuation over the proximal right lower lobar artery as well as a posterior right upper lobe segmental pulmonary artery likely artifactual. Concern still remains for pulmonary embolism, . 2. Stable bibasilar consolidation likely atelectasis and not significant changed from the prior exam. Severe AP narrowing of the trachea at the level of the carina with severe narrowing of the proximal mainstem bronchi bilaterally. 1.3 cm right subcarinal lymph node likely reactive. 3. Cardiomegaly and atherosclerotic coronary artery disease. 4. Aortic atherosclerosis. Ectasia of the ascending thoracic aorta measuring 3.9 cm in AP diameter  05-10-20: ct of abdomen and pelvis:  1. Findings consistent with fecal impaction within the distal sigmoid colon with subsequent distal large bowel obstruction. 2. Stable moderate severity areas of bibasilar consolidation. 3. Cholelithiasis. 4. Sigmoid diverticulosis. Aortic Atherosclerosis  05-21-20: KUB:  Persistent sigmoid colonic distention again noted. Remainder of the colon is nondistended. Stool noted throughout the colon. No small bowel distention. No free air. Exam unchanged from prior exam.   NO NEW EXAMS   LABS REVIEWED: PREVIOUS   07-04-19 glucose 99; bun 31; creat 1.19 ;k+ 3.8; na++ 143; ca 8.8 chol 109; ldl 68 trig 57; hdl 30; urine micro-albumin 33.8 07-05-19: tsh 1.257 free T4: 0.66  10-11-19: hgb a1c 7.2   11-05-19: tsh 2.340 01-31-20: wbc 8.3; hgb 13.2; hct 43.7; mcv 100.9 plt 271; glucose 122; bun 24; creat 1.10 ;k+ 4.0; na++ 141; ca 8.8 liver norm albumin 3.1 hgb a1c 7.9; chol 138; ldl 93; trig 57; hdl 34  05-08-20: hgb a1c 7.9; PSA  10.21 05-10-20: wbc 9.6; hgb 12.4; hct 43.6; mcv 106.3 plt 235; glucose 294; bun 36; creat 1.45; k+ 4.8; na++ 145; ca 8.7 liver normal albumin 3.2 urine culture: no growth: blood culture: staphylococcus capitis 05-12-20: wbc 15.7; hgb 11.5; hct 41.2; mcv 107.9 plt 191; glucose 175; bun 23; creat 1.25; k+ 3.1;  na++ 147 ca 7.8 liver normal albumin 2.8 05-18-20: wbc 10.2; hgb 11.4; hct 41.9; mcv 108.8 plt 219; glucose 147; bun 25; creat 1.17; k+ 4.6; na++ 147; ca 8.1  06/11/2020: wbc 7.6; hgb 11.4; hct 38.6; mcv 102.7; plt 284; glucose  111; bun 15; creat 0.90; k+ 3.5; na++ 140; ca 8.1 mag 2.0   NO NEW LABS.   Review of Systems  Unable to perform ROS: Dementia (unable to participate )    Physical Exam Constitutional:      General: He is not in acute distress.    Appearance: He is well-developed and well-nourished. He is obese. He is not diaphoretic.  Neck:     Thyroid: No thyromegaly.  Cardiovascular:     Rate and Rhythm: Normal rate and regular rhythm.     Pulses: Normal pulses and intact distal pulses.     Heart sounds: Normal heart sounds.  Pulmonary:     Effort: Pulmonary effort is normal. No respiratory distress.     Breath sounds: Wheezing and rhonchi present.     Comments: 02  Abdominal:     General: There is distension.     Palpations: Abdomen is soft.     Tenderness: There is no abdominal tenderness.     Comments: Bowel sounds are hypoactive   Musculoskeletal:        General: No edema.     Cervical back: Neck supple.     Right lower leg: No edema.     Left lower leg: No edema.     Comments: Right side hemiplegia   Lymphadenopathy:     Cervical: No cervical adenopathy.  Skin:    General: Skin is warm and dry.  Neurological:     Mental Status: He is alert. Mental status is at baseline.  Psychiatric:        Mood and Affect: Mood and affect and mood normal.       ASSESSMENT/ PLAN:  TODAY:  1. Sigmoid volvulus/large bowel obstruction: is stable has abdominal distension; will continue dulcolax supp nightly and linzess 145 mcg daily is off miralax due to thickened liquids.   2. 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 Aortic atherosclerosis:  (CT 05-09-19) is stable will on long term eliquis and blood pressure  medications.    PREVIOUS    4. Simple bronchitis: is stable is 02 dependent; will continue claritin 10 mg daily is unable to use inhalers. Will continue douneb every 6 hours   5. Hypertension associated with type 2 diabetes mellitus: is stable b/p 130/61 will continue to monitor his status; his beta blocker and ace were stopped in the hospital  6. Cerebrovascular accident (CVA) due to other mechanism/hemiplegis of right dominant side due to cerebrovascular disease: is stable will continue eliquis 5 mg twice daily   7. Hyperlipidemia associated with type 2 diabetes mellitus: is stable LDL 93 will continue to monitor his status.   8. Hyperthyroidism: is stable tsh 2.340 free t4: 0.66 will continue tapazole 2.5 mg daily   9. Deep vein thrombosis: (DVT) of left lower extremity unspecified vein/other pulmonary embolism without acute cor pulmonale: is stable is on long term eliquis 5 mg twice daily   10. Uncontrolled type 2 diabetes mellitus with neurologic complication with long term current use of insulin: hgb a1c is 7.9 will continue basaglar 31 units nightly novlog 15 units with lunch and supper; is no longer on ace or statin  11. Elevated PSA: is slightly improved: 10.21 will monitor  12. Elevated PSA; is without change will monitor   13. Dementia without behavioral disturbance unspecified dementia type: is without change will monitor            MD is aware  of resident's narcotic use and is in agreement with current plan of care. We will attempt to wean resident as appropriate.  Synthia Innocent NP [facility]  Contact [phone] Monday through Friday 8am- 5pm  After hours call [phone]

## 2020-06-05 ENCOUNTER — Encounter: Payer: Self-pay | Admitting: Adult Health

## 2020-06-05 ENCOUNTER — Non-Acute Institutional Stay (SKILLED_NURSING_FACILITY): Payer: Medicare Other | Admitting: Adult Health

## 2020-06-05 DIAGNOSIS — G8191 Hemiplegia, unspecified affecting right dominant side: Secondary | ICD-10-CM | POA: Diagnosis not present

## 2020-06-05 DIAGNOSIS — I69391 Dysphagia following cerebral infarction: Secondary | ICD-10-CM

## 2020-06-05 DIAGNOSIS — K562 Volvulus: Secondary | ICD-10-CM | POA: Diagnosis not present

## 2020-06-05 DIAGNOSIS — I679 Cerebrovascular disease, unspecified: Secondary | ICD-10-CM | POA: Diagnosis not present

## 2020-06-05 NOTE — Progress Notes (Signed)
Location:  [facility] Room Number: 112/W Place of Service:  SNF (31)   CODE STATUS: DNR  Allergies  Allergen Reactions  . Penicillin G   . Penicillins     Has patient had a PCN reaction causing immediate rash, facial/tongue/throat swelling, SOB or lightheadedness with hypotension: unknown Has patient had a PCN reaction causing severe rash involving mucus membranes or skin necrosis: unknown Has patient had a PCN reaction that required hospitalization: unknown Has patient had a PCN reaction occurring within the last 10 years: unknown If all of the above answers are "NO", then may proceed with Cephalosporin use. 5/3 Tolerated Cefepime x 1 dose in ED.     Chief Complaint  Patient presents with  . Acute Visit    Care Plan Meeting     HPI:  We have come together for his care plan meeting. Family present. BIMS unable; mood 1/30. He requires extensive to dependent  assist with adls; requires assistance with feeding his meals. Is incontinent of bladder and frequently incontinent of bowel. There are no reports of falls. His weight is stable at 245 pounds has a good appetite. He is being seen by speech therapy: he is presently on honey thick liquids; the goal is to get him back to nectar thick liquids. There are no reports of choking; no uncontrolled pain. 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 Acute respiratory failure with hypoxia [facility](HCC)    [facility], [facility]   . Constipation    [facility]   . Dependence on supplemental oxygen    [facility]   . DVT (deep venous thrombosis) (HCC) 09/26/2012  . Dysphasia   . Elevated WBC count     [facility]   . Fatty liver   . Fever, unspecified    [facility], [facility]   . Hemiplegia (HCC)   . Hemorrhoids    [facility]   . Hyperlipidemia    [facility]   . Hyperosmolality and hypernatremia    [facility]   . Hypertension   . Lack of coordination    [facility]   . Muscle weakness    [facility], [facility]   . Nasal congestion    [facility]   . Need for assistance with personal care    [facility]   . 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 Pneumonia   . Pulmonary embolism (HCC)   . Sepsis, unspecified organism [facility](HCC)    [facility]   . 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(HCC)   . Stroke (HCC)   . Type 2 diabetes mellitus with hyperglycemia (HCC)    [facility]   . Unspecified corneal  scar and opacity    Per records at [facility] EMR system, [employer]   . UTI (urinary tract infection)    Per records at [facility] EMR system, [employer]     Past Surgical History:  Procedure Laterality Date  . ESOPHAGOGASTRODUODENOSCOPY (EGD) WITH PROPOFOL N/A 05/15/2019   Procedure: ESOPHAGOGASTRODUODENOSCOPY (EGD) WITH PROPOFOL;  Surgeon: Lucretia Roers, MD;  Location: [facility];  Service: General;  Laterality: N/A;  . FLEXIBLE SIGMOIDOSCOPY N/A 10/21/2016   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with successful decompression, few divertiula at sigmoid  . FLEXIBLE SIGMOIDOSCOPY N/A 10/25/2016   sigmoid volvulus with successful decompression, dilated sigmoid colon.   Marland Kitchen PEG PLACEMENT N/A 05/15/2019    Procedure: PERCUTANEOUS ENDOSCOPIC GASTROSTOMY (PEG) PLACEMENT;  Surgeon: Lucretia Roers, MD;  Location: [facility];  Service: General;  Laterality: N/A;  . unable      Social History   Socioeconomic History  . Marital status: Single    Spouse name: Not on file  . Number of children: Not on file  . Years of education: Not on file  . Highest education level: Not on file  Occupational History  . Not on file  Tobacco Use  . Smoking status: Never Smoker  . Smokeless tobacco: Never Used  Vaping Use  . Vaping Use: Never used  Substance and Sexual Activity  . Alcohol use: No    Alcohol/week: 0.0 standard drinks  . Drug use: No  . Sexual activity: Not on file  Other Topics Concern  . Not on file  Social History Narrative  . Not on file   Social Determinants of Health   Financial Resource Strain: Not on file  Food Insecurity: Not on file  Transportation Needs: Not on file  Physical Activity: Not on file  Stress: Not on file  Social Connections: Not on file  Intimate Partner Violence: Not on file   History reviewed. No pertinent family history.    VITAL SIGNS BP 137/88   Pulse 90   Temp 98.1 F (36.7 C)   Resp 20   Ht 5\' 8"  (1.727 m)   Wt 245 lb (111.1 kg)   SpO2 93%   BMI 37.25 kg/m   Outpatient Encounter Medications as of 06/05/2020  Medication Sig  . acetaminophen (TYLENOL) 325 MG tablet Take 650 mg by mouth every 6 (six) hours as needed for moderate pain.   06/07/2020 apixaban (ELIQUIS) 5 MG TABS tablet Place 5 mg into feeding tube 2 (two) times daily. For dvt/pe/cva po  . atropine 1 % ophthalmic solution Place 1 drop under the tongue every 6 (six) hours as needed (excessive oral secretions).   . bisacodyl (DULCOLAX) 10 MG suppository Place 1 suppository (10 mg total) rectally at bedtime.  . carboxymethylcellulose (REFRESH PLUS) 0.5 % SOLN Place 1 drop into the right eye in the morning, at noon, in the evening, and at bedtime.   Marland Kitchen erythromycin ophthalmic ointment Place  1 application into the right eye 2 (two) times daily. Also apply to right eye - dx corneal scar - continue until next ophthalmology visit At Bedtime  . insulin aspart (NOVOLOG FLEXPEN) 100 UNIT/ML FlexPen Inject 15 Units into the skin 2 (two) times daily with a meal. With Lunch and Supper  . Insulin Glargine (BASAGLAR KWIKPEN) 100 UNIT/ML SOPN Inject 31 Units into the skin at bedtime.   Marland Kitchen ipratropium-albuterol (DUONEB) 0.5-2.5 (3) MG/3ML SOLN Take 3 mLs by nebulization every 6 (six) hours.   Marland Kitchen linaclotide (LINZESS) 145 MCG CAPS capsule Take 1  capsule (145 mcg total) by mouth daily before breakfast.  . loratadine (CLARITIN) 10 MG tablet Take 10 mg by mouth daily.  . methimazole (TAPAZOLE) 5 MG tablet Take 2.5 mg by mouth daily.  . NON FORMULARY Diet- Liquids: Nectar Thick Liquids  Diet:Dysphagia 1 Puree  . OXYGEN Inhale 3 L into the lungs continuous. Every shift to maintain satuation >90%.  . Sodium Phosphates (FLEET ENEMA RE) Place 1 application rectally daily as needed.  . Vitamins A & D (VITAMIN A & D) ointment Apply 1 application topically in the morning, at noon, and at bedtime. To bilateral heels   No facility-administered encounter medications on file as of 06/05/2020.     SIGNIFICANT DIAGNOSTIC EXAMS   PREVIOUS;   05-09-19: ct of abdomen and pelvis:  1. Moderately large amount of stool in the distal sigmoid colon and rectum. Moderate gaseous distension of the more proximal sigmoid colon without evidence of volvulus or bowel obstruction. 2. Bibasilar lung consolidation concerning for pneumonia. 3. Aortic Atherosclerosis    05-10-20: chest x-ray:  1. Mild patchy bilateral lung opacities which are, in part, chronic in nature. A superimposed component of atelectasis and/or infiltrate cannot be excluded. Correlation with chest CT is recommended. 2. Small bilateral pleural effusions.  05-10-20 ct angio of chest:  1. No definite evidence of acute pulmonary embolism. Low-attenuation  over the proximal right lower lobar artery as well as a posterior right upper lobe segmental pulmonary artery likely artifactual. Concern still remains for pulmonary embolism, . 2. Stable bibasilar consolidation likely atelectasis and not significant changed from the prior exam. Severe AP narrowing of the trachea at the level of the carina with severe narrowing of the proximal mainstem bronchi bilaterally. 1.3 cm right subcarinal lymph node likely reactive. 3. Cardiomegaly and atherosclerotic coronary artery disease. 4. Aortic atherosclerosis. Ectasia of the ascending thoracic aorta measuring 3.9 cm in AP diameter  05-10-20: ct of abdomen and pelvis:  1. Findings consistent with fecal impaction within the distal sigmoid colon with subsequent distal large bowel obstruction. 2. Stable moderate severity areas of bibasilar consolidation. 3. Cholelithiasis. 4. Sigmoid diverticulosis. Aortic Atherosclerosis  05-21-20: KUB:  Persistent sigmoid colonic distention again noted. Remainder of the colon is nondistended. Stool noted throughout the colon. No small bowel distention. No free air. Exam unchanged from prior exam.   NO NEW EXAMS   LABS REVIEWED: PREVIOUS   07-04-19 glucose 99; bun 31; creat 1.19 ;k+ 3.8; na++ 143; ca 8.8 chol 109; ldl 68 trig 57; hdl 30; urine micro-albumin 33.8 07-05-19: tsh 1.257 free T4: 0.66  10-11-19: hgb a1c 7.2   11-05-19: tsh 2.340 01-31-20: wbc 8.3; hgb 13.2; hct 43.7; mcv 100.9 plt 271; glucose 122; bun 24; creat 1.10 ;k+ 4.0; na++ 141; ca 8.8 liver norm albumin 3.1 hgb a1c 7.9; chol 138; ldl 93; trig 57; hdl 34  05-08-20: hgb a1c 7.9; PSA  10.21 05-10-20: wbc 9.6; hgb 12.4; hct 43.6; mcv 106.3 plt 235; glucose 294; bun 36; creat 1.45; k+ 4.8; na++ 145; ca 8.7 liver normal albumin 3.2 urine culture: no growth: blood culture: staphylococcus capitis 05-12-20: wbc 15.7; hgb 11.5; hct 41.2; mcv 107.9 plt 191; glucose 175; bun 23; creat 1.25; k+ 3.1; na++ 147 ca 7.8 liver  normal albumin 2.8 05-18-20: wbc 10.2; hgb 11.4; hct 41.9; mcv 108.8 plt 219; glucose 147; bun 25; creat 1.17; k+ 4.6; na++ 147; ca 8.1  Jun 04, 2020: wbc 7.6; hgb 11.4; hct 38.6; mcv 102.7; plt 284; glucose 111; bun 15; creat 0.90; k+ 3.5;  na++ 140; ca 8.1 mag 2.0   NO NEW LABS.   Review of Systems  Unable to perform ROS: Dementia (unable to participate )    Physical Exam Constitutional:      General: He is not in acute distress.    Appearance: He is well-developed and well-nourished. He is obese. He is not diaphoretic.  Neck:     Thyroid: No thyromegaly.  Cardiovascular:     Rate and Rhythm: Normal rate and regular rhythm.     Pulses: Normal pulses and intact distal pulses.     Heart sounds: Normal heart sounds.  Pulmonary:     Effort: Pulmonary effort is normal. No respiratory distress.     Breath sounds: Normal breath sounds. No rhonchi.     Comments: 02 Abdominal:     General: There is distension.     Palpations: Abdomen is soft.     Tenderness: There is no abdominal tenderness.     Comments: Bowel sounds hypoactive   Musculoskeletal:        General: No edema.     Cervical back: Neck supple.     Right lower leg: No edema.     Left lower leg: No edema.     Comments: Right side hemiplegia    Lymphadenopathy:     Cervical: No cervical adenopathy.  Skin:    General: Skin is warm and dry.  Neurological:     Mental Status: He is alert. Mental status is at baseline.  Psychiatric:        Mood and Affect: Mood and affect and mood normal.       ASSESSMENT/ PLAN:  TODAY  1. 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 Dysphagia due to old cerebrovascular accident on honey thick liquids 3. 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Will continue therapy as directed Will continue current medications Will continue current plan of care Will continue to monitor his status.   MD is aware of resident's narcotic use and is in agreement with current plan of care. We will  attempt to wean resident as appropriate.  Synthia Innocent NP [facility]  Contact [phone] Monday through Friday 8am- 5pm  After hours call [phone]

## 2020-06-06 ENCOUNTER — Non-Acute Institutional Stay (SKILLED_NURSING_FACILITY): Payer: Medicare Other | Admitting: Adult Health

## 2020-06-06 ENCOUNTER — Encounter: Payer: Self-pay | Admitting: Adult Health

## 2020-06-06 DIAGNOSIS — I639 Cerebral infarction, unspecified: Secondary | ICD-10-CM | POA: Diagnosis not present

## 2020-06-06 DIAGNOSIS — I10 Essential (primary) hypertension: Secondary | ICD-10-CM

## 2020-06-06 DIAGNOSIS — Z515 Encounter for palliative care: Secondary | ICD-10-CM | POA: Diagnosis not present

## 2020-06-06 DIAGNOSIS — G8191 Hemiplegia, unspecified affecting right dominant side: Secondary | ICD-10-CM | POA: Diagnosis not present

## 2020-06-06 DIAGNOSIS — J410 Simple chronic bronchitis: Secondary | ICD-10-CM

## 2020-06-06 DIAGNOSIS — I69391 Dysphagia following cerebral infarction: Secondary | ICD-10-CM | POA: Diagnosis not present

## 2020-06-06 DIAGNOSIS — I679 Cerebrovascular disease, unspecified: Secondary | ICD-10-CM | POA: Diagnosis not present

## 2020-06-06 DIAGNOSIS — I69351 Hemiplegia and hemiparesis following cerebral infarction affecting right dominant side: Secondary | ICD-10-CM | POA: Diagnosis not present

## 2020-06-06 NOTE — Progress Notes (Signed)
Location:  [facility] Room Number: 112-W Place of Service:  SNF (31)   CODE STATUS: DNR  Allergies  Allergen Reactions   Penicillin G    Penicillins     Has patient had a PCN reaction causing immediate rash, facial/tongue/throat swelling, SOB or lightheadedness with hypotension: unknown Has patient had a PCN reaction causing severe rash involving mucus membranes or skin necrosis: unknown Has patient had a PCN reaction that required hospitalization: unknown Has patient had a PCN reaction occurring within the last 10 years: unknown If all of the above answers are "NO", then may proceed with Cephalosporin use. 5/3 Tolerated Cefepime x 1 dose in ED.     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 There are no reports of uncontrolled pain;no reports of chocking. [name] [name] continues to have a good appetite; no reports of shortness of breath.   Past Medical History:  Diagnosis Date   Acute respiratory failure with hypoxia (HCC)    Per records at [facility] EMR system, [employer]    Constipation    Per records at [facility] EMR system, [employer]    Dependence on supplemental oxygen    Per records at [facility] EMR system, [employer]    DVT (deep venous thrombosis) (HCC) 09/26/2012   Dysphasia    Elevated WBC count    Per records at [facility] EMR  system, [employer]    Fatty liver    Fever, unspecified    Per records at [facility] EMR system, [employer]    Hemiplegia [facility])    Hemorrhoids    Per records at [facility] EMR system, [employer]    Hyperlipidemia    Per records at [facility] EMR system, [employer]    Hyperosmolality and hypernatremia    Per records at [facility] EMR system, [employer]    Hypertension    Lack of coordination    Per records at [facility] EMR system, [employer]    Muscle weakness    Per records at [facility] EMR system, [employer]    Nasal congestion    Per records at [facility] EMR system, [employer]    Need for assistance with personal care    Per records at [facility] EMR system, [employer]    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    Per records at [facility] EMR system, [employer]    Pneumonia    Pulmonary embolism (HCC)    Sepsis, unspecified organism [facility])    Per records at [facility] EMR system, [employer]    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[facility])    Stroke [facility])    Type 2 diabetes mellitus with hyperglycemia (HCC)    Per records at [facility] EMR system, [employer]    Unspecified corneal scar and opacity    Per records  at [facility] EMR system, [employer]    UTI (urinary tract infection)    Per records at [facility] EMR system, [employer]     Past Surgical History:  Procedure Laterality Date   ESOPHAGOGASTRODUODENOSCOPY (EGD) WITH PROPOFOL N/A 05/15/2019   Procedure: ESOPHAGOGASTRODUODENOSCOPY (EGD) WITH PROPOFOL;  Surgeon: Lucretia Roers, MD;  Location: [facility];  Service: General;  Laterality: N/A;   FLEXIBLE SIGMOIDOSCOPY N/A 10/21/2016   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with successful decompression, few divertiula at sigmoid   FLEXIBLE SIGMOIDOSCOPY N/A 10/25/2016   sigmoid volvulus with successful decompression, dilated sigmoid colon.    PEG PLACEMENT N/A 05/15/2019   Procedure: PERCUTANEOUS ENDOSCOPIC  GASTROSTOMY (PEG) PLACEMENT;  Surgeon: Lucretia Roers, MD;  Location: [facility];  Service: General;  Laterality: N/A;   unable      Social History   Socioeconomic History   Marital status: Single    Spouse name: Not on file   Number of children: Not on file   Years of education: Not on file   Highest education level: Not on file  Occupational History   Not on file  Tobacco Use   Smoking status: Never Smoker   Smokeless tobacco: Never Used  Vaping Use   Vaping Use: Never used  Substance and Sexual Activity   Alcohol use: No    Alcohol/week: 0.0 standard drinks   Drug use: No   Sexual activity: Not on file  Other Topics Concern   Not on file  Social History Narrative   Not on file   Social Determinants of Health   Financial Resource Strain: Not on file  Food Insecurity: Not on file  Transportation Needs: Not on file  Physical Activity: Not on file  Stress: Not on file  Social Connections: Not on file  Intimate Partner Violence: Not on file   History reviewed. No pertinent family history.    VITAL SIGNS BP 137/88    Pulse 90    Temp 98.1 F (36.7 C)    Resp 20    Ht 5' 8.5" (1.74 m)    Wt 245 lb (111.1 kg)    SpO2 94%    BMI 36.71 kg/m   Outpatient Encounter Medications as of 06/06/2020  Medication Sig   acetaminophen (TYLENOL) 325 MG tablet Take 650 mg by mouth every 6 (six) hours as needed for moderate pain.    apixaban (ELIQUIS) 5 MG TABS tablet Take 5 mg by mouth 2 (two) times daily. For dvt/pe/cva po   atropine 1 % ophthalmic solution Place 1 drop under the tongue every 6 (six) hours as needed (excessive oral secretions).    bisacodyl (DULCOLAX) 10 MG suppository Place 1 suppository (10 mg total) rectally at bedtime.   carboxymethylcellulose (REFRESH PLUS) 0.5 % SOLN Place 1 drop into the right eye in the morning, at noon, in the evening, and at bedtime.    erythromycin ophthalmic ointment Place 1 application into the right eye 2 (two)  times daily. Also apply to right eye - dx corneal scar - continue until next ophthalmology visit At Bedtime   insulin aspart (NOVOLOG FLEXPEN) 100 UNIT/ML FlexPen Inject 15 Units into the skin 2 (two) times daily with a meal. With Lunch and Supper   Insulin Glargine (BASAGLAR KWIKPEN) 100 UNIT/ML SOPN Inject 31 Units into the skin at bedtime.    ipratropium-albuterol (DUONEB) 0.5-2.5 (3) MG/3ML SOLN Take 3 mLs by nebulization every 6 (six) hours.    linaclotide (LINZESS) 145 MCG CAPS capsule Take 1 capsule (145  mcg total) by mouth daily before breakfast.   loratadine (CLARITIN) 10 MG tablet Take 10 mg by mouth daily.   methimazole (TAPAZOLE) 5 MG tablet Take 2.5 mg by mouth daily.   NON FORMULARY Diet- Liquids: Nectar Thick Liquids  Diet:Dysphagia 1 Puree   OXYGEN Inhale 3 L into the lungs continuous. Every shift to maintain satuation >90%.   Sodium Phosphates (FLEET ENEMA RE) Place 1 application rectally daily as needed.   Vitamins A & D (VITAMIN A & D) ointment Apply 1 application topically in the morning, at noon, and at bedtime. To bilateral heels   No facility-administered encounter medications on file as of 06/06/2020.     SIGNIFICANT DIAGNOSTIC EXAMS  PREVIOUS;   05-09-19: ct of abdomen and pelvis:  1. Moderately large amount of stool in the distal sigmoid colon and rectum. Moderate gaseous distension of the more proximal sigmoid colon without evidence of volvulus or bowel obstruction. 2. Bibasilar lung consolidation concerning for pneumonia. 3. Aortic Atherosclerosis    05-10-20: chest x-ray:  1. Mild patchy bilateral lung opacities which are, in part, chronic in nature. A superimposed component of atelectasis and/or infiltrate cannot be excluded. Correlation with chest CT is recommended. 2. Small bilateral pleural effusions.  05-10-20 ct angio of chest:  1. No definite evidence of acute pulmonary embolism. Low-attenuation over the proximal right lower lobar artery  as well as a posterior right upper lobe segmental pulmonary artery likely artifactual. Concern still remains for pulmonary embolism, . 2. Stable bibasilar consolidation likely atelectasis and not significant changed from the prior exam. Severe AP narrowing of the trachea at the level of the carina with severe narrowing of the proximal mainstem bronchi bilaterally. 1.3 cm right subcarinal lymph node likely reactive. 3. Cardiomegaly and atherosclerotic coronary artery disease. 4. Aortic atherosclerosis. Ectasia of the ascending thoracic aorta measuring 3.9 cm in AP diameter  05-10-20: ct of abdomen and pelvis:  1. Findings consistent with fecal impaction within the distal sigmoid colon with subsequent distal large bowel obstruction. 2. Stable moderate severity areas of bibasilar consolidation. 3. Cholelithiasis. 4. Sigmoid diverticulosis. Aortic Atherosclerosis  05-21-20: KUB:  Persistent sigmoid colonic distention again noted. Remainder of the colon is nondistended. Stool noted throughout the colon. No small bowel distention. No free air. Exam unchanged from prior exam.   NO NEW EXAMS   LABS REVIEWED: PREVIOUS   07-04-19 glucose 99; bun 31; creat 1.19 ;k+ 3.8; na++ 143; ca 8.8 chol 109; ldl 68 trig 57; hdl 30; urine micro-albumin 33.8 07-05-19: tsh 1.257 free T4: 0.66  10-11-19: hgb a1c 7.2   11-05-19: tsh 2.340 01-31-20: wbc 8.3; hgb 13.2; hct 43.7; mcv 100.9 plt 271; glucose 122; bun 24; creat 1.10 ;k+ 4.0; na++ 141; ca 8.8 liver norm albumin 3.1 hgb a1c 7.9; chol 138; ldl 93; trig 57; hdl 34  05-08-20: hgb a1c 7.9; PSA  10.21 05-10-20: wbc 9.6; hgb 12.4; hct 43.6; mcv 106.3 plt 235; glucose 294; bun 36; creat 1.45; k+ 4.8; na++ 145; ca 8.7 liver normal albumin 3.2 urine culture: no growth: blood culture: staphylococcus capitis 05-12-20: wbc 15.7; hgb 11.5; hct 41.2; mcv 107.9 plt 191; glucose 175; bun 23; creat 1.25; k+ 3.1; na++ 147 ca 7.8 liver normal albumin 2.8 05-18-20: wbc 10.2; hgb  11.4; hct 41.9; mcv 108.8 plt 219; glucose 147; bun 25; creat 1.17; k+ 4.6; na++ 147; ca 8.1  06/01/2020: wbc 7.6; hgb 11.4; hct 38.6; mcv 102.7; plt 284; glucose 111; bun 15; creat 0.90; k+ 3.5; na++ 140; ca  8.1 mag 2.0   NO NEW LABS.    Review of Systems  Unable to perform ROS: Dementia (unable to participate )     Physical Exam Constitutional:      General: [name] [name] is not in acute distress.    Appearance: [name] [name] is well-developed and well-nourished. [name] [name] is obese. [name] [name] is not diaphoretic.  Neck:     Thyroid: No thyromegaly.  Cardiovascular:     Rate and Rhythm: Normal rate and regular rhythm.     Pulses: Normal pulses and intact distal pulses.     Heart sounds: Normal heart sounds.  Pulmonary:     Effort: Pulmonary effort is normal. No respiratory distress.     Breath sounds: Rhonchi present.     Comments: 02 Abdominal:     General: Bowel sounds are normal. There is distension.     Palpations: Abdomen is soft.     Tenderness: There is no abdominal tenderness.  Musculoskeletal:        General: No edema.     Cervical back: Neck supple.     Right lower leg: No edema.     Left lower leg: No edema.     Comments: Right side hemiplegia   Lymphadenopathy:     Cervical: No cervical adenopathy.  Skin:    General: Skin is warm and dry.  Neurological:     Mental Status: [name] [name] is alert. Mental status is at baseline.  Psychiatric:        Mood and Affect: Mood and affect and mood normal.      ASSESSMENT/ PLAN:  TODAY:   1. Simple bronchitis: is stable is 02 dependent will continue claritin 10 mg daily is unable to use inhalers; will continue to monitor his status   2. Hypertension associated with type 2 diabetes mellitus is stable b/p 137/88 will continue to monitor his status; his beta blocker and ace have been stopped in the hospital.   3. Cerebrovascular accident (CVA) due to other mechanism/hemieplegia of right dominant side due to cerebrovascular disease  is stable will continue eliquis 5  mg twice daily    PREVIOUS    4. Hyperlipidemia associated with type 2 diabetes mellitus: is stable LDL 93 will continue to monitor his status.   5. Hyperthyroidism: is stable tsh 2.340 free t4: 0.66 will continue tapazole 2.5 mg daily   6. Deep vein thrombosis: (DVT) of left lower extremity unspecified vein/other pulmonary embolism without acute cor pulmonale: is stable is on long term eliquis 5 mg twice daily   7. Uncontrolled type 2 diabetes mellitus with neurologic complication with long term current use of insulin: hgb a1c is 7.9 will continue basaglar 31 units nightly novlog 15 units with lunch and supper; is no longer on ace or statin  8. Elevated PSA: is slightly improved: 10.21 will monitor  9. Elevated PSA; is without change will monitor   10. Dementia without behavioral disturbance unspecified dementia type: is without change will monitor   11. Sigmoid volvulus/large bowel obstruction: is stable has abdominal distension; will continue dulcolax supp nightly and linzess 145 mcg daily is off miralax due to thickened liquids.   12. 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 Aortic atherosclerosis:  (CT 05-09-19) is stable will on long term eliquis and blood pressure medications.     MD is aware of resident's narcotic use and is in agreement with current plan of care. We will attempt to wean resident as appropriate.  Ok Edwards NP [facility]  Contact [phone] Monday through Friday 8am- 5pm  After hours call [phone]

## 2020-06-12 ENCOUNTER — Non-Acute Institutional Stay (SKILLED_NURSING_FACILITY): Payer: Medicare Other | Admitting: Adult Health

## 2020-06-12 ENCOUNTER — Encounter: Payer: Self-pay | Admitting: Adult Health

## 2020-06-12 DIAGNOSIS — E785 Hyperlipidemia, unspecified: Secondary | ICD-10-CM | POA: Diagnosis not present

## 2020-06-12 DIAGNOSIS — E1169 Type 2 diabetes mellitus with other specified complication: Secondary | ICD-10-CM | POA: Diagnosis not present

## 2020-06-12 DIAGNOSIS — I2782 Chronic pulmonary embolism: Secondary | ICD-10-CM | POA: Diagnosis not present

## 2020-06-12 DIAGNOSIS — I82502 Chronic embolism and thrombosis of unspecified deep veins of left lower extremity: Secondary | ICD-10-CM | POA: Diagnosis not present

## 2020-06-12 DIAGNOSIS — E0590 Thyrotoxicosis, unspecified without thyrotoxic crisis or storm: Secondary | ICD-10-CM | POA: Diagnosis not present

## 2020-06-12 NOTE — Progress Notes (Signed)
Location:  [facility] Room Number: 112-W Place of Service:  SNF (31)   CODE STATUS: DNR  Allergies  Allergen Reactions  . Penicillin G   . Penicillins     Has patient had a PCN reaction causing immediate rash, facial/tongue/throat swelling, SOB or lightheadedness with hypotension: unknown Has patient had a PCN reaction causing severe rash involving mucus membranes or skin necrosis: unknown Has patient had a PCN reaction that required hospitalization: unknown Has patient had a PCN reaction occurring within the last 10 years: unknown If all of the above answers are "NO", then may proceed with Cephalosporin use. 5/3 Tolerated Cefepime x 1 dose in ED.     Chief Complaint  Patient presents with  . Routine visit           Hyperlipidemia associated with type 2 diabetes mellitus:   Hyperthyroidism:  Deep vein thrombosis (DVT) of left lower extremity unspecified vein/other pulmonary embolism without acuate cor pulmonale    Weekly follow up for the first 30 days post hospitalization.     HPI:  He is a 74 year old long term resident of this facility being seen for the management of his chronic illnesses:  Hyperlipidemia associated with type 2 diabetes mellitus:   Hyperthyroidism:  Deep vein thrombosis (DVT) of left lower extremity unspecified vein/other pulmonary embolism without acuate cor pulmonale. There are no reports of uncontrolled pain; there are no reports of choking; but he does chronically aspirate. There are no reports of constipation present.   Past Medical History:  Diagnosis Date  . Acute respiratory failure with hypoxia [facility])    Per records at [facility] EMR system, [employer]   . Constipation    Per records at [facility] EMR system, [employer]   . Dependence on supplemental oxygen    Per records at [facility] EMR system, [employer]   . DVT (deep venous thrombosis) (HCC) 09/26/2012  . Dysphasia   . Elevated WBC count    Per records at  [facility] EMR system, [employer]   . Fatty liver   . Fever, unspecified    Per records at [facility] EMR system, [employer]   . Hemiplegia (HCC)   . Hemorrhoids    Per records at [facility] EMR system, [employer]   . Hyperlipidemia    Per records at [facility] EMR system, [employer]   . Hyperosmolality and hypernatremia    Per records at [facility] EMR system, [employer]   . Hypertension   . Lack of coordination    Per records at [facility] EMR system, [employer]   . Muscle weakness    Per records at [facility] EMR system, [employer]   . Nasal congestion    Per records at [facility] EMR system, [employer]   . Need for assistance with personal care    Per records at [facility] EMR system, [employer]   . 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    Per records at [facility] EMR system, [employer]   . Pneumonia   . Pulmonary embolism (HCC)   . Sepsis, unspecified organism [facility])    Per records at [facility] EMR system, [employer]   . 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(HCC)   . Stroke (HCC)   . Type 2 diabetes mellitus with hyperglycemia (HCC)    Per records at [facility] EMR system, [employer]   . Unspecified corneal scar and opacity  Per records at [facility] EMR system, [employer]   . UTI (urinary tract infection)    Per records at [facility] EMR system, [employer]     Past Surgical History:  Procedure Laterality Date  . ESOPHAGOGASTRODUODENOSCOPY (EGD) WITH PROPOFOL N/A 05/15/2019   Procedure: ESOPHAGOGASTRODUODENOSCOPY (EGD) WITH PROPOFOL;  Surgeon: Lucretia Roers, MD;  Location: [facility];  Service: General;  Laterality: N/A;  . FLEXIBLE SIGMOIDOSCOPY N/A 10/21/2016   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with successful decompression, few divertiula at sigmoid  . FLEXIBLE SIGMOIDOSCOPY N/A 10/25/2016   sigmoid volvulus with successful decompression, dilated sigmoid colon.   Marland Kitchen PEG PLACEMENT N/A 05/15/2019   Procedure:  PERCUTANEOUS ENDOSCOPIC GASTROSTOMY (PEG) PLACEMENT;  Surgeon: Lucretia Roers, MD;  Location: [facility];  Service: General;  Laterality: N/A;  . unable      Social History   Socioeconomic History  . Marital status: Single    Spouse name: Not on file  . Number of children: Not on file  . Years of education: Not on file  . Highest education level: Not on file  Occupational History  . Not on file  Tobacco Use  . Smoking status: Never Smoker  . Smokeless tobacco: Never Used  Vaping Use  . Vaping Use: Never used  Substance and Sexual Activity  . Alcohol use: No    Alcohol/week: 0.0 standard drinks  . Drug use: No  . Sexual activity: Not on file  Other Topics Concern  . Not on file  Social History Narrative  . Not on file   Social Determinants of Health   Financial Resource Strain: Not on file  Food Insecurity: Not on file  Transportation Needs: Not on file  Physical Activity: Not on file  Stress: Not on file  Social Connections: Not on file  Intimate Partner Violence: Not on file   History reviewed. No pertinent family history.    VITAL SIGNS BP 137/88   Pulse 90   Temp (!) 97.4 F (36.3 C)   Resp 20   Ht 5' 8.5" (1.74 m)   Wt 245 lb (111.1 kg)   SpO2 94%   BMI 36.71 kg/m   Outpatient Encounter Medications as of 06/12/2020  Medication Sig  . acetaminophen (TYLENOL) 325 MG tablet Take 650 mg by mouth every 6 (six) hours as needed for moderate pain.   Marland Kitchen apixaban (ELIQUIS) 5 MG TABS tablet Take 5 mg by mouth 2 (two) times daily. For dvt/pe/cva po  . atropine 1 % ophthalmic solution Place 1 drop under the tongue every 6 (six) hours as needed (excessive oral secretions).   . bisacodyl (DULCOLAX) 10 MG suppository Place 1 suppository (10 mg total) rectally at bedtime.  . carboxymethylcellulose (REFRESH PLUS) 0.5 % SOLN Place 1 drop into the right eye in the morning, at noon, in the evening, and at bedtime.   Marland Kitchen erythromycin ophthalmic ointment Place 1 application  into the right eye 2 (two) times daily. Also apply to right eye - dx corneal scar - continue until next ophthalmology visit At Bedtime  . insulin aspart (NOVOLOG FLEXPEN) 100 UNIT/ML FlexPen Inject 15 Units into the skin 2 (two) times daily with a meal. With Lunch and Supper  . Insulin Glargine (BASAGLAR KWIKPEN) 100 UNIT/ML SOPN Inject 31 Units into the skin at bedtime.   Marland Kitchen ipratropium-albuterol (DUONEB) 0.5-2.5 (3) MG/3ML SOLN Take 3 mLs by nebulization every 6 (six) hours.   Marland Kitchen linaclotide (LINZESS) 145 MCG CAPS capsule Take 1 capsule (145 mcg total) by mouth  daily before breakfast.  . loratadine (CLARITIN) 10 MG tablet Take 10 mg by mouth daily.  . methimazole (TAPAZOLE) 5 MG tablet Take 2.5 mg by mouth daily.  . NON FORMULARY Diet- Liquids: Nectar Thick Liquids  Diet:Dysphagia 1 Puree  . OXYGEN Inhale 3 L into the lungs continuous. Every shift to maintain satuation >90%.  . Sodium Phosphates (FLEET ENEMA RE) Place 1 application rectally daily as needed.  . Vitamins A & D (VITAMIN A & D) ointment Apply 1 application topically in the morning, at noon, and at bedtime. To bilateral heels   No facility-administered encounter medications on file as of 06/12/2020.     SIGNIFICANT DIAGNOSTIC EXAMS   PREVIOUS;   05-09-19: ct of abdomen and pelvis:  1. Moderately large amount of stool in the distal sigmoid colon and rectum. Moderate gaseous distension of the more proximal sigmoid colon without evidence of volvulus or bowel obstruction. 2. Bibasilar lung consolidation concerning for pneumonia. 3. Aortic Atherosclerosis    05-10-20: chest x-ray:  1. Mild patchy bilateral lung opacities which are, in part, chronic in nature. A superimposed component of atelectasis and/or infiltrate cannot be excluded. Correlation with chest CT is recommended. 2. Small bilateral pleural effusions.  05-10-20 ct angio of chest:  1. No definite evidence of acute pulmonary embolism. Low-attenuation over the  proximal right lower lobar artery as well as a posterior right upper lobe segmental pulmonary artery likely artifactual. Concern still remains for pulmonary embolism, . 2. Stable bibasilar consolidation likely atelectasis and not significant changed from the prior exam. Severe AP narrowing of the trachea at the level of the carina with severe narrowing of the proximal mainstem bronchi bilaterally. 1.3 cm right subcarinal lymph node likely reactive. 3. Cardiomegaly and atherosclerotic coronary artery disease. 4. Aortic atherosclerosis. Ectasia of the ascending thoracic aorta measuring 3.9 cm in AP diameter  05-10-20: ct of abdomen and pelvis:  1. Findings consistent with fecal impaction within the distal sigmoid colon with subsequent distal large bowel obstruction. 2. Stable moderate severity areas of bibasilar consolidation. 3. Cholelithiasis. 4. Sigmoid diverticulosis. Aortic Atherosclerosis  05-21-20: KUB:  Persistent sigmoid colonic distention again noted. Remainder of the colon is nondistended. Stool noted throughout the colon. No small bowel distention. No free air. Exam unchanged from prior exam.   NO NEW EXAMS   LABS REVIEWED: PREVIOUS   07-04-19 glucose 99; bun 31; creat 1.19 ;k+ 3.8; na++ 143; ca 8.8 chol 109; ldl 68 trig 57; hdl 30; urine micro-albumin 33.8 07-05-19: tsh 1.257 free T4: 0.66  10-11-19: hgb a1c 7.2   11-05-19: tsh 2.340 01-31-20: wbc 8.3; hgb 13.2; hct 43.7; mcv 100.9 plt 271; glucose 122; bun 24; creat 1.10 ;k+ 4.0; na++ 141; ca 8.8 liver norm albumin 3.1 hgb a1c 7.9; chol 138; ldl 93; trig 57; hdl 34  05-08-20: hgb a1c 7.9; PSA  10.21 05-10-20: wbc 9.6; hgb 12.4; hct 43.6; mcv 106.3 plt 235; glucose 294; bun 36; creat 1.45; k+ 4.8; na++ 145; ca 8.7 liver normal albumin 3.2 urine culture: no growth: blood culture: staphylococcus capitis 05-12-20: wbc 15.7; hgb 11.5; hct 41.2; mcv 107.9 plt 191; glucose 175; bun 23; creat 1.25; k+ 3.1; na++ 147 ca 7.8 liver normal albumin  2.8 05-18-20: wbc 10.2; hgb 11.4; hct 41.9; mcv 108.8 plt 219; glucose 147; bun 25; creat 1.17; k+ 4.6; na++ 147; ca 8.1  06/18/2020: wbc 7.6; hgb 11.4; hct 38.6; mcv 102.7; plt 284; glucose 111; bun 15; creat 0.90; k+ 3.5; na++ 140; ca 8.1 mag 2.0  NO NEW LABS.    Review of Systems  Unable to perform ROS: Dementia (unable to participate )   Physical Exam Constitutional:      General: He is not in acute distress.    Appearance: He is well-developed and well-nourished. He is obese. He is not diaphoretic.  Neck:     Thyroid: No thyromegaly.  Cardiovascular:     Rate and Rhythm: Normal rate and regular rhythm.     Pulses: Normal pulses and intact distal pulses.     Heart sounds: Normal heart sounds.  Pulmonary:     Effort: Pulmonary effort is normal. No respiratory distress.     Breath sounds: Rhonchi present.     Comments: 02  Abdominal:     General: Bowel sounds are normal. There is distension.     Palpations: Abdomen is soft.     Tenderness: There is no abdominal tenderness.  Musculoskeletal:        General: No edema.     Cervical back: Neck supple.     Right lower leg: No edema.     Left lower leg: No edema.     Comments: Right hemiplegia   Lymphadenopathy:     Cervical: No cervical adenopathy.  Skin:    General: Skin is warm and dry.  Neurological:     Mental Status: He is alert. Mental status is at baseline.  Psychiatric:        Mood and Affect: Mood and affect and mood normal.     ASSESSMENT/ PLAN:  TODAY:  1. Hyperlipidemia associated with type 2 diabetes mellitus: is stable LDL 93 will continue to monitor his status.   2. Hyperthyroidism: Is stable tsh 2.340; free t4: 0.66 will continue tapazole 2.5 mg daily   3. Deep vein thrombosis (DVT) of left lower extremity unspecified vein/other pulmonary embolism without acuate cor pulmonale is on long term eliquis 5 mg twice daily    PREVIOUS    4. Uncontrolled type 2 diabetes mellitus with neurologic complication  with long term current use of insulin: hgb a1c is 7.9 will continue basaglar 31 units nightly novlog 15 units with lunch and supper; is no longer on ace or statin  5. Elevated PSA: is slightly improved: 10.21 will monitor  6. Dementia without behavioral disturbance unspecified dementia type: is without change will monitor  Weight is 245 pounds.   7. Sigmoid volvulus/large bowel obstruction: is stable has abdominal distension; will continue dulcolax supp nightly and linzess 145 mcg daily is off miralax due to thickened liquids.   8. Dysphagia due to old cerebrovascular accident: is currently on nectar thick liquids: will monitor   9. Aortic atherosclerosis:  (CT 05-09-19) is stable will on long term eliquis and blood pressure medications.  10. Simple bronchitis: is stable is 02 dependent will continue claritin 10 mg daily is unable to use inhalers; will continue to monitor his status   11. Hypertension associated with type 2 diabetes mellitus is stable b/p 137/88 will continue to monitor his status; his beta blocker and ace have been stopped in the hospital.   12. 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 We will attempt to wean resident as appropriate.  Synthia Innocent NP [facility]  Contact [phone] Monday through Friday 8am- 5pm  After hours call [phone]

## 2020-06-19 ENCOUNTER — Other Ambulatory Visit (HOSPITAL_COMMUNITY)
Admission: RE | Admit: 2020-06-19 | Discharge: 2020-06-19 | Disposition: A | Discharge status: Home / Self Care | Payer: Medicare Other | Source: Skilled Nursing Facility | Attending: Adult Health | Admitting: Adult Health

## 2020-06-19 DIAGNOSIS — E0590 Thyrotoxicosis, unspecified without thyrotoxic crisis or storm: Secondary | ICD-10-CM | POA: Insufficient documentation

## 2020-06-19 LAB — TSH: TSH: 0.795 u[IU]/mL (ref 0.350–4.500)

## 2020-06-19 LAB — T4, FREE: Free T4: 0.71 ng/dL (ref 0.61–1.12)

## 2020-06-20 LAB — T3, FREE: T3, Free: 2.1 pg/mL (ref 2.0–4.4)

## 2020-06-21 DEATH — deceased

## 2020-06-23 DIAGNOSIS — Z431 Encounter for attention to gastrostomy: Secondary | ICD-10-CM | POA: Diagnosis not present

## 2020-06-23 DIAGNOSIS — Z1159 Encounter for screening for other viral diseases: Secondary | ICD-10-CM | POA: Diagnosis not present

## 2020-06-25 ENCOUNTER — Other Ambulatory Visit: Payer: Self-pay | Admitting: *Deleted

## 2020-06-25 NOTE — Patient Outreach (Signed)
Member screened for potential [facility] Care Management needs. Mr. Rattigan resides in [facility] SN[facility].   Communication sent to [facility] SN[facility] SW to verify member has been a LTC resident at [facility].    Raiford Noble, MSN, RN,BSN [facility] Post Acute Care Coordinator [phone] [facility]) [phone]  (Toll free office)

## 2020-06-27 ENCOUNTER — Other Ambulatory Visit: Payer: Self-pay | Admitting: *Deleted

## 2020-06-27 NOTE — Patient Outreach (Incomplete)
[facility] Post-Acute Care Coordinator follow up. Member screened for potential [facility] Care Management needs.

## 2020-06-30 NOTE — Patient Outreach (Addendum)
[facility] Post- Acute Care Coordinator follow up. Member screened for potential [facility] Care Management needs.  Update received from [facility] SW indicating member is a long term care resident of [facility] SNF.  No identifiable [facility] Care Management needs at this time.    Raiford Noble, MSN, RN,BSN [facility] Post Acute Care Coordinator [phone] [facility]) [phone]  (Toll free office)

## 2020-07-01 ENCOUNTER — Non-Acute Institutional Stay (SKILLED_NURSING_FACILITY): Payer: Medicare Other | Admitting: Adult Health

## 2020-07-01 ENCOUNTER — Encounter: Payer: Self-pay | Admitting: Adult Health

## 2020-07-01 DIAGNOSIS — R972 Elevated prostate specific antigen [PSA]: Secondary | ICD-10-CM | POA: Diagnosis not present

## 2020-07-01 DIAGNOSIS — E1165 Type 2 diabetes mellitus with hyperglycemia: Secondary | ICD-10-CM

## 2020-07-01 DIAGNOSIS — IMO0002 Reserved for concepts with insufficient information to code with codable children: Secondary | ICD-10-CM

## 2020-07-01 DIAGNOSIS — Z794 Long term (current) use of insulin: Secondary | ICD-10-CM | POA: Diagnosis not present

## 2020-07-01 DIAGNOSIS — E1149 Type 2 diabetes mellitus with other diabetic neurological complication: Secondary | ICD-10-CM | POA: Diagnosis not present

## 2020-07-01 DIAGNOSIS — F0390 Unspecified dementia without behavioral disturbance: Secondary | ICD-10-CM | POA: Diagnosis not present

## 2020-07-01 NOTE — Progress Notes (Signed)
Location:  [facility] Room Number: 112-W Place of Service:  SNF (31)   CODE STATUS: DNR  Allergies  Allergen Reactions  . Penicillin G   . Penicillins     Has patient had a PCN reaction causing immediate rash, facial/tongue/throat swelling, SOB or lightheadedness with hypotension: unknown Has patient had a PCN reaction causing severe rash involving mucus membranes or skin necrosis: unknown Has patient had a PCN reaction that required hospitalization: unknown Has patient had a PCN reaction occurring within the last 10 years: unknown If all of the above answers are "NO", then may proceed with Cephalosporin use. 5/3 Tolerated Cefepime x 1 dose in ED.     Chief Complaint  Patient presents with  . 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 Elevated PSA  Dementia without behavioral disturbance unspecified dementia type:Marland Kitchen There are no reports of recent aspiration; no reports of uncontrolled pain. No reports of agitation; he does spend all of his time in bed per his choice.    Past Medical History:  Diagnosis Date  . Acute respiratory failure with hypoxia [facility])    Per records at [facility] EMR system, [employer]   . Constipation    Per records at [facility] EMR system, [employer]   . Dependence on supplemental oxygen    Per records at [facility] EMR system, [employer]   . DVT (deep venous thrombosis) (HCC) 09/26/2012  . Dysphasia   . Elevated WBC count    Per records at [facility] EMR system, [employer]   . Fatty liver    . Fever, unspecified    Per records at [facility] EMR system, [employer]   . Hemiplegia (HCC)   . Hemorrhoids    Per records at [facility] EMR system, [employer]   . Hyperlipidemia    Per records at [facility] EMR system, [employer]   . Hyperosmolality and hypernatremia    Per records at [facility] EMR system, [employer]   . Hypertension   . Lack of coordination    Per records at [facility] EMR system, [employer]   . Muscle weakness    Per records at [facility] EMR system, [employer]   . Nasal congestion    Per records at [facility] EMR system, [employer]   . Need for assistance with personal care    Per records at [facility] EMR system, [employer]   . 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 Pneumonia   . Pulmonary embolism (HCC)   . Sepsis, unspecified organism [facility])    Per records at [facility] EMR system, [employer]   . 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(HCC)   . Stroke (HCC)   . Type 2 diabetes mellitus with hyperglycemia (HCC)    Per records at [facility] EMR system, [employer]   . Unspecified corneal scar and opacity  Per records at [facility] EMR system, [employer]   . UTI (urinary tract infection)    Per records at [facility] EMR system, [employer]     Past Surgical History:  Procedure Laterality Date  . ESOPHAGOGASTRODUODENOSCOPY (EGD) WITH PROPOFOL N/A 05/15/2019   Procedure: ESOPHAGOGASTRODUODENOSCOPY (EGD) WITH PROPOFOL;  Surgeon: Lucretia Roers, MD;  Location: [facility];  Service: General;  Laterality: N/A;  . FLEXIBLE SIGMOIDOSCOPY N/A 10/21/2016   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with successful decompression, few divertiula at sigmoid  . FLEXIBLE SIGMOIDOSCOPY N/A 10/25/2016   sigmoid volvulus with successful decompression, dilated sigmoid colon.   Marland Kitchen PEG PLACEMENT N/A 05/15/2019   Procedure: PERCUTANEOUS ENDOSCOPIC GASTROSTOMY (PEG) PLACEMENT;  Surgeon:  Lucretia Roers, MD;  Location: [facility];  Service: General;  Laterality: N/A;  . unable      Social History   Socioeconomic History  . Marital status: Single    Spouse name: Not on file  . Number of children: Not on file  . Years of education: Not on file  . Highest education level: Not on file  Occupational History  . Not on file  Tobacco Use  . Smoking status: Never Smoker  . Smokeless tobacco: Never Used  Vaping Use  . Vaping Use: Never used  Substance and Sexual Activity  . Alcohol use: No    Alcohol/week: 0.0 standard drinks  . Drug use: No  . Sexual activity: Not on file  Other Topics Concern  . Not on file  Social History Narrative  . Not on file   Social Determinants of Health   Financial Resource Strain: Not on file  Food Insecurity: Not on file  Transportation Needs: Not on file  Physical Activity: Not on file  Stress: Not on file  Social Connections: Not on file  Intimate Partner Violence: Not on file   History reviewed. No pertinent family history.    VITAL SIGNS BP (!) 146/90   Pulse 94   Temp 97.7 F (36.5 C)   Resp 20   Ht 5' 8.5" (1.74 m)   Wt 246 lb (111.6 kg)   SpO2 91%   BMI 36.86 kg/m   Outpatient Encounter Medications as of 07/01/2020  Medication Sig  . acetaminophen (TYLENOL) 325 MG tablet Take 650 mg by mouth every 6 (six) hours as needed for moderate pain.   Marland Kitchen apixaban (ELIQUIS) 5 MG TABS tablet Take 5 mg by mouth 2 (two) times daily. For dvt/pe/cva po  . atropine 1 % ophthalmic solution Place 1 drop under the tongue every 6 (six) hours as needed (excessive oral secretions).   . bisacodyl (DULCOLAX) 10 MG suppository Place 1 suppository (10 mg total) rectally at bedtime.  . carboxymethylcellulose (REFRESH PLUS) 0.5 % SOLN Place 1 drop into the right eye in the morning, at noon, in the evening, and at bedtime.   Marland Kitchen erythromycin ophthalmic ointment Place 1 application into the right eye 2 (two) times daily. Also apply to right eye -  dx corneal scar - continue until next ophthalmology visit At Bedtime  . insulin aspart (NOVOLOG FLEXPEN) 100 UNIT/ML FlexPen Inject 15 Units into the skin 2 (two) times daily with a meal. With Lunch and Supper  . Insulin Glargine (BASAGLAR KWIKPEN) 100 UNIT/ML SOPN Inject 31 Units into the skin at bedtime.   Marland Kitchen ipratropium-albuterol (DUONEB) 0.5-2.5 (3) MG/3ML SOLN Take 3 mLs by nebulization every 6 (six) hours.   Marland Kitchen linaclotide (LINZESS) 145 MCG CAPS capsule Take 1 capsule (145 mcg total) by mouth  daily before breakfast.  . loratadine (CLARITIN) 10 MG tablet Take 10 mg by mouth daily.  . methimazole (TAPAZOLE) 5 MG tablet Take 2.5 mg by mouth daily.  . NON FORMULARY Diet- Liquids: Nectar Thick Liquids  Diet:Dysphagia 1 Puree  . OXYGEN Inhale 3 L into the lungs continuous. Every shift to maintain satuation >90%.  . Sodium Phosphates (FLEET ENEMA RE) Place 1 application rectally daily as needed.  . Vitamins A & D (VITAMIN A & D) ointment Apply 1 application topically in the morning, at noon, and at bedtime. To bilateral heels   No facility-administered encounter medications on file as of 07/01/2020.     SIGNIFICANT DIAGNOSTIC EXAMS   PREVIOUS;   05-09-19: ct of abdomen and pelvis:  1. Moderately large amount of stool in the distal sigmoid colon and rectum. Moderate gaseous distension of the more proximal sigmoid colon without evidence of volvulus or bowel obstruction. 2. Bibasilar lung consolidation concerning for pneumonia. 3. Aortic Atherosclerosis    05-10-20: chest x-ray:  1. Mild patchy bilateral lung opacities which are, in part, chronic in nature. A superimposed component of atelectasis and/or infiltrate cannot be excluded. Correlation with chest CT is recommended. 2. Small bilateral pleural effusions.  05-10-20 ct angio of chest:  1. No definite evidence of acute pulmonary embolism. Low-attenuation over the proximal right lower lobar artery as well as a posterior right upper  lobe segmental pulmonary artery likely artifactual. Concern still remains for pulmonary embolism, . 2. Stable bibasilar consolidation likely atelectasis and not significant changed from the prior exam. Severe AP narrowing of the trachea at the level of the carina with severe narrowing of the proximal mainstem bronchi bilaterally. 1.3 cm right subcarinal lymph node likely reactive. 3. Cardiomegaly and atherosclerotic coronary artery disease. 4. Aortic atherosclerosis. Ectasia of the ascending thoracic aorta measuring 3.9 cm in AP diameter  05-10-20: ct of abdomen and pelvis:  1. Findings consistent with fecal impaction within the distal sigmoid colon with subsequent distal large bowel obstruction. 2. Stable moderate severity areas of bibasilar consolidation. 3. Cholelithiasis. 4. Sigmoid diverticulosis. Aortic Atherosclerosis  05-21-20: KUB:  Persistent sigmoid colonic distention again noted. Remainder of the colon is nondistended. Stool noted throughout the colon. No small bowel distention. No free air. Exam unchanged from prior exam.   NO NEW EXAMS   LABS REVIEWED: PREVIOUS   07-04-19 glucose 99; bun 31; creat 1.19 ;k+ 3.8; na++ 143; ca 8.8 chol 109; ldl 68 trig 57; hdl 30; urine micro-albumin 33.8 07-05-19: tsh 1.257 free T4: 0.66  10-11-19: hgb a1c 7.2   11-05-19: tsh 2.340 01-31-20: wbc 8.3; hgb 13.2; hct 43.7; mcv 100.9 plt 271; glucose 122; bun 24; creat 1.10 ;k+ 4.0; na++ 141; ca 8.8 liver norm albumin 3.1 hgb a1c 7.9; chol 138; ldl 93; trig 57; hdl 34  05-08-20: hgb a1c 7.9; PSA  10.21 05-10-20: wbc 9.6; hgb 12.4; hct 43.6; mcv 106.3 plt 235; glucose 294; bun 36; creat 1.45; k+ 4.8; na++ 145; ca 8.7 liver normal albumin 3.2 urine culture: no growth: blood culture: staphylococcus capitis 05-12-20: wbc 15.7; hgb 11.5; hct 41.2; mcv 107.9 plt 191; glucose 175; bun 23; creat 1.25; k+ 3.1; na++ 147 ca 7.8 liver normal albumin 2.8 05-18-20: wbc 10.2; hgb 11.4; hct 41.9; mcv 108.8 plt 219;  glucose 147; bun 25; creat 1.17; k+ 4.6; na++ 147; ca 8.1  05/21/2020: wbc 7.6; hgb 11.4; hct 38.6; mcv 102.7; plt 284; glucose 111; bun 15; creat 0.90; k+ 3.5; na++ 140; ca 8.1 mag 2.0  NO NEW LABS.   Review of Systems  Unable to perform ROS: Dementia (unable to participate )   Physical Exam Constitutional:      General: He is not in acute distress.    Appearance: He is well-developed and well-nourished. He is obese. He is not diaphoretic.  Neck:     Thyroid: No thyromegaly.  Cardiovascular:     Rate and Rhythm: Normal rate and regular rhythm.     Pulses: Normal pulses and intact distal pulses.     Heart sounds: Normal heart sounds.  Pulmonary:     Effort: Pulmonary effort is normal. No respiratory distress.     Breath sounds: Rhonchi present.     Comments: 02 Abdominal:     General: Bowel sounds are normal. There is distension.     Palpations: Abdomen is soft.     Tenderness: There is no abdominal tenderness.  Musculoskeletal:        General: No edema.     Cervical back: Neck supple.     Right lower leg: No edema.     Left lower leg: No edema.     Comments: Right side hemiplegia   Lymphadenopathy:     Cervical: No cervical adenopathy.  Skin:    General: Skin is warm and dry.  Neurological:     Mental Status: He is alert. Mental status is at baseline.  Psychiatric:        Mood and Affect: Mood and affect and mood normal.      ASSESSMENT/ PLAN:  TODAY:  1. 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  2. Elevated PSA: is stable 10.21 will monitor  3. Dementia without behavioral disturbance unspecified dementia type:  without change weight is 246 will monitor    PREVIOUS    4. Sigmoid volvulus/large bowel obstruction: is stable has abdominal distension; will continue dulcolax supp  nightly and linzess 145 mcg daily is off miralax due to thickened liquids.   5. Dysphagia due to old cerebrovascular accident: is currently on nectar thick liquids: will monitor   6. Aortic atherosclerosis:  (CT 05-09-19) is stable will on long term eliquis and blood pressure medications.  7. Simple bronchitis: is stable is 02 dependent will continue claritin 10 mg daily is unable to use inhalers; will continue to monitor his status   8. Hypertension associated with type 2 diabetes mellitus is stable b/p 137/88 will continue to monitor his status; his beta blocker and ace have been stopped in the hospital.   9. 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 Hyperlipidemia associated with type 2 diabetes mellitus: is stable LDL 93 will continue to monitor his status.   11. Hyperthyroidism: Is stable tsh 2.340; free t4: 0.66 will continue tapazole 2.5 mg daily   12. Deep vein thrombosis (DVT) of left lower extremity unspecified vein/other pulmonary embolism without acuate cor pulmonale is on long term eliquis 5 mg twice daily        MD is aware of resident's narcotic use and is in agreement with current plan of care. We will attempt to wean resident as appropriate.  Synthia Innocenteborah Jaramie Bastos NP [facility]  Contact [phone] Monday through Friday 8am- 5pm  After  hours call [phone]

## 2020-07-02 DIAGNOSIS — Z431 Encounter for attention to gastrostomy: Secondary | ICD-10-CM | POA: Diagnosis not present

## 2020-07-02 DIAGNOSIS — Z1159 Encounter for screening for other viral diseases: Secondary | ICD-10-CM | POA: Diagnosis not present

## 2020-07-07 DIAGNOSIS — F0390 Unspecified dementia without behavioral disturbance: Secondary | ICD-10-CM | POA: Insufficient documentation

## 2020-07-09 DIAGNOSIS — Z1159 Encounter for screening for other viral diseases: Secondary | ICD-10-CM | POA: Diagnosis not present

## 2020-07-09 DIAGNOSIS — Z431 Encounter for attention to gastrostomy: Secondary | ICD-10-CM | POA: Diagnosis not present

## 2020-07-11 DIAGNOSIS — I69351 Hemiplegia and hemiparesis following cerebral infarction affecting right dominant side: Secondary | ICD-10-CM | POA: Diagnosis not present

## 2020-07-11 DIAGNOSIS — I69391 Dysphagia following cerebral infarction: Secondary | ICD-10-CM | POA: Diagnosis not present

## 2020-07-11 DIAGNOSIS — I639 Cerebral infarction, unspecified: Secondary | ICD-10-CM | POA: Diagnosis not present

## 2020-07-11 DIAGNOSIS — Z515 Encounter for palliative care: Secondary | ICD-10-CM | POA: Diagnosis not present

## 2020-07-11 DIAGNOSIS — Z1159 Encounter for screening for other viral diseases: Secondary | ICD-10-CM | POA: Diagnosis not present

## 2020-07-11 DIAGNOSIS — Z431 Encounter for attention to gastrostomy: Secondary | ICD-10-CM | POA: Diagnosis not present

## 2020-07-14 DIAGNOSIS — Z1159 Encounter for screening for other viral diseases: Secondary | ICD-10-CM | POA: Diagnosis not present

## 2020-07-14 DIAGNOSIS — Z431 Encounter for attention to gastrostomy: Secondary | ICD-10-CM | POA: Diagnosis not present

## 2020-07-16 DIAGNOSIS — Z1159 Encounter for screening for other viral diseases: Secondary | ICD-10-CM | POA: Diagnosis not present

## 2020-07-16 DIAGNOSIS — Z431 Encounter for attention to gastrostomy: Secondary | ICD-10-CM | POA: Diagnosis not present

## 2020-07-18 DIAGNOSIS — Z431 Encounter for attention to gastrostomy: Secondary | ICD-10-CM | POA: Diagnosis not present

## 2020-07-18 DIAGNOSIS — Z1159 Encounter for screening for other viral diseases: Secondary | ICD-10-CM | POA: Diagnosis not present

## 2020-07-21 DIAGNOSIS — Z1159 Encounter for screening for other viral diseases: Secondary | ICD-10-CM | POA: Diagnosis not present

## 2020-07-21 DIAGNOSIS — Z431 Encounter for attention to gastrostomy: Secondary | ICD-10-CM | POA: Diagnosis not present

## 2020-07-23 DIAGNOSIS — Z1159 Encounter for screening for other viral diseases: Secondary | ICD-10-CM | POA: Diagnosis not present

## 2020-07-23 DIAGNOSIS — Z431 Encounter for attention to gastrostomy: Secondary | ICD-10-CM | POA: Diagnosis not present

## 2020-07-25 DIAGNOSIS — Z431 Encounter for attention to gastrostomy: Secondary | ICD-10-CM | POA: Diagnosis not present

## 2020-07-25 DIAGNOSIS — Z1159 Encounter for screening for other viral diseases: Secondary | ICD-10-CM | POA: Diagnosis not present

## 2020-07-28 DIAGNOSIS — Z431 Encounter for attention to gastrostomy: Secondary | ICD-10-CM | POA: Diagnosis not present

## 2020-07-28 DIAGNOSIS — Z1159 Encounter for screening for other viral diseases: Secondary | ICD-10-CM | POA: Diagnosis not present

## 2020-07-29 ENCOUNTER — Non-Acute Institutional Stay (SKILLED_NURSING_FACILITY): Payer: Medicare Other | Admitting: Adult Health

## 2020-07-29 ENCOUNTER — Encounter: Payer: Self-pay | Admitting: Adult Health

## 2020-07-29 DIAGNOSIS — K562 Volvulus: Secondary | ICD-10-CM | POA: Diagnosis not present

## 2020-07-29 DIAGNOSIS — I69391 Dysphagia following cerebral infarction: Secondary | ICD-10-CM

## 2020-07-29 DIAGNOSIS — I700 Atherosclerosis of aorta: Secondary | ICD-10-CM

## 2020-07-29 NOTE — Progress Notes (Signed)
Location:  [facility] Room Number: 112-W Place of Service:  SNF (31)   CODE STATUS: DNR  Allergies  Allergen Reactions  . Penicillin G   . Penicillins     Has patient had a PCN reaction causing immediate rash, facial/tongue/throat swelling, SOB or lightheadedness with hypotension: unknown Has patient had a PCN reaction causing severe rash involving mucus membranes or skin necrosis: unknown Has patient had a PCN reaction that required hospitalization: unknown Has patient had a PCN reaction occurring within the last 10 years: unknown If all of the above answers are "NO", then may proceed with Cephalosporin use. 5/3 Tolerated Cefepime x 1 dose in ED.     Chief Complaint  Patient presents with  . 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 There are no reports of aspiration present. No reports of constipation; no indication of pain present.    Past Medical History:  Diagnosis Date  . Acute respiratory failure with hypoxia [facility])    Per records at [facility] EMR system, [employer]   . Constipation    Per records at [facility] EMR system, [employer]   . Dependence on supplemental oxygen    Per records at [facility] EMR system, [employer]   . DVT (deep venous thrombosis) (HCC) 09/26/2012  . Dysphasia   . Elevated WBC count    Per records at [facility] EMR system, [employer]   . Fatty liver   . Fever, unspecified    Per records at [facility] EMR system, [employer]   . Hemiplegia (HCC)   . Hemorrhoids    Per records at [facility] EMR system, [employer]   . Hyperlipidemia     Per records at [facility] EMR system, [employer]   . Hyperosmolality and hypernatremia    Per records at [facility] EMR system, [employer]   . Hypertension   . Lack of coordination    Per records at [facility] EMR system, [employer]   . Muscle weakness    Per records at [facility] EMR system, [employer]   . Nasal congestion    Per records at [facility] EMR system, [employer]   . Need for assistance with personal care    Per records at [facility] EMR system, [employer]   . 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 Pneumonia   . Pulmonary embolism (HCC)   . Sepsis, unspecified organism [facility])    Per records at [facility] EMR system, [employer]   . 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(HCC)   . Stroke (HCC)   . Type 2 diabetes mellitus with hyperglycemia (HCC)    Per records at [facility] EMR system, [employer]   . Unspecified corneal scar and opacity    Per records at [facility] EMR system, [employer]   . UTI (urinary tract infection)    Per records at [facility] EMR system, [employer]     Past Surgical  History:  Procedure Laterality Date  . ESOPHAGOGASTRODUODENOSCOPY (EGD) WITH PROPOFOL N/A 05/15/2019   Procedure: ESOPHAGOGASTRODUODENOSCOPY (EGD) WITH PROPOFOL;  Surgeon: Lucretia Roers, MD;  Location: [facility];  Service: General;  Laterality: N/A;  . FLEXIBLE SIGMOIDOSCOPY N/A 10/21/2016   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with successful decompression, few divertiula at sigmoid  . FLEXIBLE SIGMOIDOSCOPY N/A 10/25/2016   sigmoid volvulus with successful decompression, dilated sigmoid colon.   Marland Kitchen PEG PLACEMENT N/A 05/15/2019   Procedure: PERCUTANEOUS ENDOSCOPIC GASTROSTOMY (PEG) PLACEMENT;  Surgeon: Lucretia Roers, MD;  Location: [facility];  Service: General;  Laterality: N/A;  . unable      Social History   Socioeconomic History  . Marital status: Single    Spouse name: Not on file  .  Number of children: Not on file  . Years of education: Not on file  . Highest education level: Not on file  Occupational History  . Not on file  Tobacco Use  . Smoking status: Never Smoker  . Smokeless tobacco: Never Used  Vaping Use  . Vaping Use: Never used  Substance and Sexual Activity  . Alcohol use: No    Alcohol/week: 0.0 standard drinks  . Drug use: No  . Sexual activity: Not on file  Other Topics Concern  . Not on file  Social History Narrative  . Not on file   Social Determinants of Health   Financial Resource Strain: Not on file  Food Insecurity: Not on file  Transportation Needs: Not on file  Physical Activity: Not on file  Stress: Not on file  Social Connections: Not on file  Intimate Partner Violence: Not on file   History reviewed. No pertinent family history.    VITAL SIGNS BP 130/84   Pulse (!) 108   Temp 97.8 F (36.6 C)   Resp 20   Ht 5' 8.5" (1.74 m)   Wt 242 lb 9.6 oz (110 kg)   SpO2 92%   BMI 36.35 kg/m   Outpatient Encounter Medications as of 07/29/2020  Medication Sig  . acetaminophen (TYLENOL) 325 MG tablet Take 650 mg by mouth every 6 (six) hours as needed for moderate pain.   Marland Kitchen apixaban (ELIQUIS) 5 MG TABS tablet Take 5 mg by mouth 2 (two) times daily. For dvt/pe/cva po  . atropine 1 % ophthalmic solution Place 1 drop under the tongue every 6 (six) hours as needed (excessive oral secretions).   Lucilla Lame [place]-Castor Oil (VENELEX) OINT Apply 1 application topically as directed. to left buttock qshift for prevention Day, Evening, Night  . bisacodyl (DULCOLAX) 10 MG suppository Place 1 suppository (10 mg total) rectally at bedtime.  . carboxymethylcellulose (REFRESH PLUS) 0.5 % SOLN Place 1 drop into the right eye in the morning, at noon, in the evening, and at bedtime.   Marland Kitchen erythromycin ophthalmic ointment Place 1 application into the right eye 2 (two) times daily. Also apply to right eye - dx corneal scar - continue until next ophthalmology  visit At Bedtime  . insulin aspart (NOVOLOG FLEXPEN) 100 UNIT/ML FlexPen Inject 15 Units into the skin 2 (two) times daily with a meal. With Lunch and Supper  . Insulin Glargine (BASAGLAR KWIKPEN) 100 UNIT/ML SOPN Inject 31 Units into the skin at bedtime.   Marland Kitchen ipratropium-albuterol (DUONEB) 0.5-2.5 (3) MG/3ML SOLN Take 3 mLs by nebulization every 6 (six) hours.   Marland Kitchen linaclotide (LINZESS) 145 MCG CAPS capsule Take 1 capsule (145 mcg total) by mouth daily before breakfast.  . loratadine (CLARITIN) 10 MG tablet  Take 10 mg by mouth daily.  . magnesium hydroxide (MILK OF MAGNESIA) 400 MG/5ML suspension Take 30 mLs by mouth daily as needed for mild constipation.  . methimazole (TAPAZOLE) 5 MG tablet Take 2.5 mg by mouth daily.  . NON FORMULARY Diet- Liquids: Nectar Thick Liquids  Diet:Dysphagia 1 Puree  . OXYGEN Inhale 3 L into the lungs continuous. Every shift to maintain satuation >90%.  . Sodium Phosphates (FLEET ENEMA RE) Place 1 application rectally daily as needed.  . Vitamins A & D (VITAMIN A & D) ointment Apply 1 application topically in the morning, at noon, and at bedtime. To bilateral heels   No facility-administered encounter medications on file as of 07/29/2020.     SIGNIFICANT DIAGNOSTIC EXAMS  PREVIOUS;   05-09-19: ct of abdomen and pelvis:  1. Moderately large amount of stool in the distal sigmoid colon and rectum. Moderate gaseous distension of the more proximal sigmoid colon without evidence of volvulus or bowel obstruction. 2. Bibasilar lung consolidation concerning for pneumonia. 3. Aortic Atherosclerosis    05-10-20: chest x-ray:  1. Mild patchy bilateral lung opacities which are, in part, chronic in nature. A superimposed component of atelectasis and/or infiltrate cannot be excluded. Correlation with chest CT is recommended. 2. Small bilateral pleural effusions.  05-10-20 ct angio of chest:  1. No definite evidence of acute pulmonary embolism. Low-attenuation over the  proximal right lower lobar artery as well as a posterior right upper lobe segmental pulmonary artery likely artifactual. Concern still remains for pulmonary embolism, . 2. Stable bibasilar consolidation likely atelectasis and not significant changed from the prior exam. Severe AP narrowing of the trachea at the level of the carina with severe narrowing of the proximal mainstem bronchi bilaterally. 1.3 cm right subcarinal lymph node likely reactive. 3. Cardiomegaly and atherosclerotic coronary artery disease. 4. Aortic atherosclerosis. Ectasia of the ascending thoracic aorta measuring 3.9 cm in AP diameter  05-10-20: ct of abdomen and pelvis:  1. Findings consistent with fecal impaction within the distal sigmoid colon with subsequent distal large bowel obstruction. 2. Stable moderate severity areas of bibasilar consolidation. 3. Cholelithiasis. 4. Sigmoid diverticulosis. Aortic Atherosclerosis  05-21-20: KUB:  Persistent sigmoid colonic distention again noted. Remainder of the colon is nondistended. Stool noted throughout the colon. No small bowel distention. No free air. Exam unchanged from prior exam.   NO NEW EXAMS   LABS REVIEWED: PREVIOUS   10-11-19: hgb a1c 7.2   11-05-19: tsh 2.340 01-31-20: wbc 8.3; hgb 13.2; hct 43.7; mcv 100.9 plt 271; glucose 122; bun 24; creat 1.10 ;k+ 4.0; na++ 141; ca 8.8 liver norm albumin 3.1 hgb a1c 7.9; chol 138; ldl 93; trig 57; hdl 34  05-08-20: hgb a1c 7.9; PSA  10.21 05-10-20: wbc 9.6; hgb 12.4; hct 43.6; mcv 106.3 plt 235; glucose 294; bun 36; creat 1.45; k+ 4.8; na++ 145; ca 8.7 liver normal albumin 3.2 urine culture: no growth: blood culture: staphylococcus capitis 05-12-20: wbc 15.7; hgb 11.5; hct 41.2; mcv 107.9 plt 191; glucose 175; bun 23; creat 1.25; k+ 3.1; na++ 147 ca 7.8 liver normal albumin 2.8 05-18-20: wbc 10.2; hgb 11.4; hct 41.9; mcv 108.8 plt 219; glucose 147; bun 25; creat 1.17; k+ 4.6; na++ 147; ca 8.1  June 04, 2020: wbc 7.6; hgb 11.4; hct  38.6; mcv 102.7; plt 284; glucose 111; bun 15; creat 0.90; k+ 3.5; na++ 140; ca 8.1 mag 2.0   TODAY  06-19-20: tsh 0.795 free t4: 0.71; free t3: 2.1   Review of Systems  Unable to  perform ROS: Dementia (unable to participate )   Physical Exam Constitutional:      General: He is not in acute distress.    Appearance: He is well-developed and well-nourished. He is obese. He is not diaphoretic.  Neck:     Thyroid: No thyromegaly.  Cardiovascular:     Rate and Rhythm: Normal rate and regular rhythm.     Pulses: Normal pulses and intact distal pulses.     Heart sounds: Normal heart sounds.  Pulmonary:     Effort: Pulmonary effort is normal. No respiratory distress.     Breath sounds: Rhonchi present.     Comments: 02 Abdominal:     General: Bowel sounds are normal. There is no distension.     Palpations: Abdomen is soft.     Tenderness: There is no abdominal tenderness.  Musculoskeletal:        General: No edema.     Cervical back: Neck supple.     Right lower leg: No edema.     Left lower leg: No edema.     Comments: Right hemiplegia   Lymphadenopathy:     Cervical: No cervical adenopathy.  Skin:    General: Skin is warm and dry.  Neurological:     Mental Status: He is alert. Mental status is at baseline.  Psychiatric:        Mood and Affect: Mood and affect and mood normal.       ASSESSMENT/ PLAN:  TODAY:  1. 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/ large bowel obstruction: is stable has mild abdominal distention; will continue lenzess 145 mcg daily cannot take miralax due to thickened liquids.   2. 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   3. Aortic atherosclerosis: (ct 05-09-19) is stable is on long term anticoagulation therapy and blood pressure medications.    PREVIOUS    4. Simple bronchitis: is stable is 02 dependent will continue claritin 10 mg daily is unable to use inhalers; will  continue to monitor his status   5. Hypertension associated with type 2 diabetes mellitus is stable b/p 130/84  will continue to monitor his status; his beta blocker and ace have been stopped in the hospital.   6. 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 Hyperlipidemia associated with type 2 diabetes mellitus: is stable LDL 93 will continue to monitor his status.   8. Hyperthyroidism: Is stable tsh 2.340; free t4: 0.66 will continue tapazole 2.5 mg daily   9. Deep vein thrombosis (DVT) of left lower extremity unspecified vein/other pulmonary embolism without acuate cor pulmonale is on long term eliquis 5 mg twice daily   10. 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  11. Elevated PSA: is stable 10.21 will monitor  12. Dementia without behavioral disturbance unspecified dementia type:  without change weight is 246 will monitor     MD is aware of resident's narcotic use and is in agreement with current plan of care. We will attempt to wean resident as appropriate.  Synthia Innocent NP [facility]  Contact [phone] Monday through Friday 8am- 5pm  After hours call [phone]

## 2020-07-30 ENCOUNTER — Encounter: Payer: Self-pay | Admitting: Adult Health

## 2020-07-30 ENCOUNTER — Non-Acute Institutional Stay (SKILLED_NURSING_FACILITY): Payer: Medicare Other | Admitting: Adult Health

## 2020-07-30 DIAGNOSIS — Z1159 Encounter for screening for other viral diseases: Secondary | ICD-10-CM | POA: Diagnosis not present

## 2020-07-30 DIAGNOSIS — I152 Hypertension secondary to endocrine disorders: Secondary | ICD-10-CM | POA: Diagnosis not present

## 2020-07-30 DIAGNOSIS — E1159 Type 2 diabetes mellitus with other circulatory complications: Secondary | ICD-10-CM

## 2020-07-30 DIAGNOSIS — Z431 Encounter for attention to gastrostomy: Secondary | ICD-10-CM | POA: Diagnosis not present

## 2020-07-30 NOTE — Progress Notes (Signed)
Location:  [facility] Room Number: 112/W Place of Service:  SNF (31)   CODE STATUS: DNR  Allergies  Allergen Reactions  . Penicillin G   . Penicillins     Has patient had a PCN reaction causing immediate rash, facial/tongue/throat swelling, SOB or lightheadedness with hypotension: unknown Has patient had a PCN reaction causing severe rash involving mucus membranes or skin necrosis: unknown Has patient had a PCN reaction that required hospitalization: unknown Has patient had a PCN reaction occurring within the last 10 years: unknown If all of the above answers are "NO", then may proceed with Cephalosporin use. 5/3 Tolerated Cefepime x 1 dose in ED.     Chief Complaint  Patient presents with  . Acute Visit    Hypertension     HPI:  For the past several days he has had elevated blood pressure readings up to 160/100. He is presently not on medications for his hypertension. He has a history of diabetes type 2.  There are no reports of uncontrolled pain; he has chronic aspiration but there are no indications of infection at this time.   Past Medical History:  Diagnosis Date  . Acute respiratory failure with hypoxia [facility])    Per records at [facility] EMR system, [employer]   . Constipation    Per records at [facility] EMR system, [employer]   . Dependence on supplemental oxygen    Per records at [facility] EMR system, [employer]   . DVT (deep venous thrombosis) (HCC) 09/26/2012  . Dysphasia   . Elevated WBC count    Per records at [facility] EMR system, [employer]   . Fatty liver   . Fever, unspecified    Per records at [facility] EMR system, [employer]   . Hemiplegia (HCC)   . Hemorrhoids    Per records at [facility] EMR system, [employer]   . Hyperlipidemia    Per records at [facility] EMR system, [employer]   . Hyperosmolality and hypernatremia    Per records at [facility] EMR system, [employer]   .  Hypertension   . Lack of coordination    Per records at [facility] EMR system, [employer]   . Muscle weakness    Per records at [facility] EMR system, [employer]   . Nasal congestion    Per records at [facility] EMR system, [employer]   . Need for assistance with personal care    Per records at [facility] EMR system, [employer]   . 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 Pneumonia   . Pulmonary embolism (HCC)   . Sepsis, unspecified organism [facility])    Per records at [facility] EMR system, [employer]   . 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(HCC)   . Stroke (HCC)   . Type 2 diabetes mellitus with hyperglycemia (HCC)    Per records at [facility] EMR system, [employer]   . Unspecified corneal scar and opacity    Per records at [facility] EMR system, [employer]   . UTI (urinary tract infection)    Per records at [facility] EMR system, [employer]     Past Surgical History:  Procedure Laterality Date  . ESOPHAGOGASTRODUODENOSCOPY (EGD) WITH PROPOFOL N/A 05/15/2019   Procedure: ESOPHAGOGASTRODUODENOSCOPY (EGD) WITH PROPOFOL;  Surgeon: Lucretia Roers, MD;  Location: [facility]  ORS;  Service: General;  Laterality: N/A;  . FLEXIBLE SIGMOIDOSCOPY N/A 10/21/2016   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with successful decompression, few divertiula at sigmoid  . FLEXIBLE SIGMOIDOSCOPY N/A 10/25/2016   sigmoid volvulus with successful decompression, dilated sigmoid colon.   Marland Kitchen PEG PLACEMENT N/A 05/15/2019   Procedure: PERCUTANEOUS ENDOSCOPIC GASTROSTOMY (PEG) PLACEMENT;  Surgeon: Lucretia Roers, MD;  Location: [facility] ORS;  Service: General;  Laterality: N/A;  . unable      Social History   Socioeconomic History  . Marital status: Single    Spouse name: Not on file  . Number of children: Not on file  . Years of education: Not on file  . Highest education level: Not on file  Occupational History  . Not on file  Tobacco  Use  . Smoking status: Never Smoker  . Smokeless tobacco: Never Used  Vaping Use  . Vaping Use: Never used  Substance and Sexual Activity  . Alcohol use: No    Alcohol/week: 0.0 standard drinks  . Drug use: No  . Sexual activity: Not on file  Other Topics Concern  . Not on file  Social History Narrative  . Not on file   Social Determinants of Health   Financial Resource Strain: Not on file  Food Insecurity: Not on file  Transportation Needs: Not on file  Physical Activity: Not on file  Stress: Not on file  Social Connections: Not on file  Intimate Partner Violence: Not on file   History reviewed. No pertinent family history.    VITAL SIGNS BP (!) 160/92   Pulse 62   Temp 97.6 F (36.4 C)   Resp 20   Ht 5' 8.5" (1.74 m)   Wt 242 lb 9.6 oz (110 kg)   SpO2 94%   BMI 36.35 kg/m   Outpatient Encounter Medications as of 07/30/2020  Medication Sig  . acetaminophen (TYLENOL) 325 MG tablet Take 650 mg by mouth every 6 (six) hours as needed for moderate pain.   Marland Kitchen apixaban (ELIQUIS) 5 MG TABS tablet Take 5 mg by mouth 2 (two) times daily. For dvt/pe/cva po  . atropine 1 % ophthalmic solution Place 1 drop under the tongue every 6 (six) hours as needed (excessive oral secretions).   Lucilla Lame [place]-Castor Oil (VENELEX) OINT Apply 1 application topically as directed. to left buttock qshift for prevention Day, Evening, Night  . bisacodyl (DULCOLAX) 10 MG suppository Place 1 suppository (10 mg total) rectally at bedtime.  . carboxymethylcellulose (REFRESH PLUS) 0.5 % SOLN Place 1 drop into the right eye in the morning, at noon, in the evening, and at bedtime.   Marland Kitchen erythromycin ophthalmic ointment Place 1 application into the right eye 2 (two) times daily. Also apply to right eye - dx corneal scar - continue until next ophthalmology visit At Bedtime  . insulin aspart (NOVOLOG FLEXPEN) 100 UNIT/ML FlexPen Inject 15 Units into the skin 2 (two) times daily with a meal. With Lunch and Supper   . Insulin Glargine (BASAGLAR KWIKPEN) 100 UNIT/ML SOPN Inject 31 Units into the skin at bedtime.   Marland Kitchen ipratropium-albuterol (DUONEB) 0.5-2.5 (3) MG/3ML SOLN Take 3 mLs by nebulization every 6 (six) hours.   Marland Kitchen linaclotide (LINZESS) 145 MCG CAPS capsule Take 145 mcg by mouth daily in the afternoon. Special Instructions: Give on an empty stomach at least before lunch. Do not crush. May open capsule and sprinkle contents on applesauce if unable to swallow capsule.  . loratadine (CLARITIN) 10 MG tablet Take 10 mg  by mouth daily.  . magnesium hydroxide (MILK OF MAGNESIA) 400 MG/5ML suspension Take 30 mLs by mouth daily as needed for mild constipation.  . methimazole (TAPAZOLE) 5 MG tablet Take 2.5 mg by mouth daily.  . NON FORMULARY Diet- Liquids: Nectar Thick Liquids  Diet:Dysphagia 1 Puree  . OXYGEN Inhale 3 L into the lungs continuous. Every shift to maintain satuation >90%.  . Sodium Phosphates (FLEET ENEMA RE) Place 1 application rectally daily as needed.  . Vitamins A & D (VITAMIN A & D) ointment Apply 1 application topically in the morning, at noon, and at bedtime. To bilateral heels  . [DISCONTINUED] linaclotide (LINZESS) 145 MCG CAPS capsule Take 1 capsule (145 mcg total) by mouth daily before breakfast.   No facility-administered encounter medications on file as of 07/30/2020.     SIGNIFICANT DIAGNOSTIC EXAMS   PREVIOUS;   05-09-19: ct of abdomen and pelvis:  1. Moderately large amount of stool in the distal sigmoid colon and rectum. Moderate gaseous distension of the more proximal sigmoid colon without evidence of volvulus or bowel obstruction. 2. Bibasilar lung consolidation concerning for pneumonia. 3. Aortic Atherosclerosis    05-10-20: chest x-ray:  1. Mild patchy bilateral lung opacities which are, in part, chronic in nature. A superimposed component of atelectasis and/or infiltrate cannot be excluded. Correlation with chest CT is recommended. 2. Small bilateral  pleural effusions.  05-10-20 ct angio of chest:  1. No definite evidence of acute pulmonary embolism. Low-attenuation over the proximal right lower lobar artery as well as a posterior right upper lobe segmental pulmonary artery likely artifactual. Concern still remains for pulmonary embolism, . 2. Stable bibasilar consolidation likely atelectasis and not significant changed from the prior exam. Severe AP narrowing of the trachea at the level of the carina with severe narrowing of the proximal mainstem bronchi bilaterally. 1.3 cm right subcarinal lymph node likely reactive. 3. Cardiomegaly and atherosclerotic coronary artery disease. 4. Aortic atherosclerosis. Ectasia of the ascending thoracic aorta measuring 3.9 cm in AP diameter  05-10-20: ct of abdomen and pelvis:  1. Findings consistent with fecal impaction within the distal sigmoid colon with subsequent distal large bowel obstruction. 2. Stable moderate severity areas of bibasilar consolidation. 3. Cholelithiasis. 4. Sigmoid diverticulosis. Aortic Atherosclerosis  05-21-20: KUB:  Persistent sigmoid colonic distention again noted. Remainder of the colon is nondistended. Stool noted throughout the colon. No small bowel distention. No free air. Exam unchanged from prior exam.   NO NEW EXAMS   LABS REVIEWED: PREVIOUS   10-11-19: hgb a1c 7.2   11-05-19: tsh 2.340 01-31-20: wbc 8.3; hgb 13.2; hct 43.7; mcv 100.9 plt 271; glucose 122; bun 24; creat 1.10 ;k+ 4.0; na++ 141; ca 8.8 liver norm albumin 3.1 hgb a1c 7.9; chol 138; ldl 93; trig 57; hdl 34  05-08-20: hgb a1c 7.9; PSA  10.21 05-10-20: wbc 9.6; hgb 12.4; hct 43.6; mcv 106.3 plt 235; glucose 294; bun 36; creat 1.45; k+ 4.8; na++ 145; ca 8.7 liver normal albumin 3.2 urine culture: no growth: blood culture: staphylococcus capitis 05-12-20: wbc 15.7; hgb 11.5; hct 41.2; mcv 107.9 plt 191; glucose 175; bun 23; creat 1.25; k+ 3.1; na++ 147 ca 7.8 liver normal albumin 2.8 05-18-20: wbc 10.2; hgb  11.4; hct 41.9; mcv 108.8 plt 219; glucose 147; bun 25; creat 1.17; k+ 4.6; na++ 147; ca 8.1  06/04/2020: wbc 7.6; hgb 11.4; hct 38.6; mcv 102.7; plt 284; glucose 111; bun 15; creat 0.90; k+ 3.5; na++ 140; ca 8.1 mag 2.0  06-19-20: tsh  0.795 free t4: 0.71; free t3: 2.1  NO NEW LABS.   Review of Systems  Unable to perform ROS: Dementia (unable to participoate )     Physical Exam Constitutional:      General: He is not in acute distress.    Appearance: He is well-developed and well-nourished. He is not diaphoretic.  Neck:     Thyroid: No thyromegaly.  Cardiovascular:     Rate and Rhythm: Normal rate and regular rhythm.     Pulses: Normal pulses and intact distal pulses.     Heart sounds: Normal heart sounds.  Pulmonary:     Effort: Pulmonary effort is normal. No respiratory distress.     Breath sounds: Rhonchi present.     Comments: 02 Abdominal:     General: Bowel sounds are normal. There is no distension.     Palpations: Abdomen is soft.     Tenderness: There is no abdominal tenderness.  Musculoskeletal:        General: No edema.     Cervical back: Neck supple.     Right lower leg: No edema.     Left lower leg: No edema.     Comments: Right hemiplegia  Lymphadenopathy:     Cervical: No cervical adenopathy.  Skin:    General: Skin is warm and dry.  Neurological:     Mental Status: He is alert. Mental status is at baseline.  Psychiatric:        Mood and Affect: Mood and affect and mood normal.       ASSESSMENT/ PLAN:  TODAY   1. Hypertension associated with type 2 diabetes mellitus   Is worse will begin cozaar 50 mg daily will monitor his status and will repeat BMP on 08-08-20    MD is aware of resident's narcotic use and is in agreement with current plan of care. We will attempt to wean resident as appropriate.  Synthia Innocent NP [facility]  Contact [phone] Monday through Friday 8am- 5pm  After hours call [phone]

## 2020-08-01 DIAGNOSIS — Z431 Encounter for attention to gastrostomy: Secondary | ICD-10-CM | POA: Diagnosis not present

## 2020-08-01 DIAGNOSIS — Z1159 Encounter for screening for other viral diseases: Secondary | ICD-10-CM | POA: Diagnosis not present

## 2020-08-01 DIAGNOSIS — I152 Hypertension secondary to endocrine disorders: Secondary | ICD-10-CM | POA: Insufficient documentation

## 2020-08-04 DIAGNOSIS — I739 Peripheral vascular disease, unspecified: Secondary | ICD-10-CM | POA: Diagnosis not present

## 2020-08-04 DIAGNOSIS — Z431 Encounter for attention to gastrostomy: Secondary | ICD-10-CM | POA: Diagnosis not present

## 2020-08-04 DIAGNOSIS — Z1159 Encounter for screening for other viral diseases: Secondary | ICD-10-CM | POA: Diagnosis not present

## 2020-08-04 DIAGNOSIS — L602 Onychogryphosis: Secondary | ICD-10-CM | POA: Diagnosis not present

## 2020-08-06 ENCOUNTER — Non-Acute Institutional Stay (SKILLED_NURSING_FACILITY): Payer: Medicare Other | Admitting: Adult Health

## 2020-08-06 ENCOUNTER — Encounter: Payer: Self-pay | Admitting: Adult Health

## 2020-08-06 DIAGNOSIS — E1149 Type 2 diabetes mellitus with other diabetic neurological complication: Secondary | ICD-10-CM | POA: Diagnosis not present

## 2020-08-06 DIAGNOSIS — Z794 Long term (current) use of insulin: Secondary | ICD-10-CM

## 2020-08-06 DIAGNOSIS — E1159 Type 2 diabetes mellitus with other circulatory complications: Secondary | ICD-10-CM | POA: Diagnosis not present

## 2020-08-06 DIAGNOSIS — Z1159 Encounter for screening for other viral diseases: Secondary | ICD-10-CM | POA: Diagnosis not present

## 2020-08-06 DIAGNOSIS — I152 Hypertension secondary to endocrine disorders: Secondary | ICD-10-CM

## 2020-08-06 DIAGNOSIS — Z431 Encounter for attention to gastrostomy: Secondary | ICD-10-CM | POA: Diagnosis not present

## 2020-08-06 NOTE — Progress Notes (Signed)
Location:  [facility] Room Number: 112/W Place of Service:  SNF (31)   CODE STATUS: DNR  Allergies  Allergen Reactions  . Penicillin G   . Penicillins     Has patient had a PCN reaction causing immediate rash, facial/tongue/throat swelling, SOB or lightheadedness with hypotension: unknown Has patient had a PCN reaction causing severe rash involving mucus membranes or skin necrosis: unknown Has patient had a PCN reaction that required hospitalization: unknown Has patient had a PCN reaction occurring within the last 10 years: unknown If all of the above answers are "NO", then may proceed with Cephalosporin use. 5/3 Tolerated Cefepime x 1 dose in ED.     Chief Complaint  Patient presents with  . Acute Visit    Diabetes Management    HPI:  He is being seen today to review his diabetic regimen. He is presently taking basaglar 31 units nightly and novolog 15 units twice daily with meals. His cbg readings are averaged: AM 120; lunch 197; supper 181; hs 208. He is tolerating these medications without issues. His co-morbidities include cva; copd; hyperthyroidism; dementia. He does chronic aspirate; is presently without signs of infection present.   Past Medical History:  Diagnosis Date  . Acute respiratory failure with hypoxia [facility](HCC)    [facility]   . Constipation    [facility]   . Dependence on supplemental oxygen    [facility]   . DVT (deep venous thrombosis) (HCC) 09/26/2012  . Dysphasia   . Elevated WBC count    [facility]   . Fatty liver   . Fever, unspecified    [facility]   . Hemiplegia (HCC)   . Hemorrhoids    [facility]   . Hyperlipidemia    [facility]    . Hyperosmolality and hypernatremia    [facility]   . Hypertension   . Lack of coordination    [facility]   . Muscle weakness    [facility]   . Nasal congestion    [facility]   . Need for assistance with personal care    [facility]   . 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 Pneumonia   . Pulmonary embolism (HCC)   . Sepsis, unspecified organism [facility]HCC)    [facility]   . 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(HCC)   . Stroke (HCC)   . Type 2 diabetes mellitus with hyperglycemia (HCC)    [facility]   . Unspecified corneal scar and opacity    [facility]   . UTI (urinary tract infection)    [facility]     Past Surgical History:  Procedure Laterality Date  . ESOPHAGOGASTRODUODENOSCOPY (EGD) WITH PROPOFOL N/A 05/15/2019   Procedure: ESOPHAGOGASTRODUODENOSCOPY (EGD) WITH  PROPOFOL;  Surgeon: Lucretia Roers, MD;  Location: [facility];  Service: General;  Laterality: N/A;  . FLEXIBLE SIGMOIDOSCOPY N/A 10/21/2016   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with successful decompression, few divertiula at sigmoid  . FLEXIBLE SIGMOIDOSCOPY N/A 10/25/2016   sigmoid volvulus with successful decompression, dilated sigmoid colon.   Marland Kitchen PEG PLACEMENT N/A 05/15/2019   Procedure: PERCUTANEOUS ENDOSCOPIC GASTROSTOMY (PEG) PLACEMENT;  Surgeon: Lucretia Roers, MD;  Location: [facility];  Service: General;  Laterality: N/A;  . unable      Social History   Socioeconomic History  . Marital status: Single    Spouse name: Not on file  . Number of children: Not on file  . Years of education:  Not on file  . Highest education level: Not on file  Occupational History  . Not on file  Tobacco Use  . Smoking status: Never Smoker  . Smokeless tobacco: Never Used  Vaping Use  . Vaping Use: Never used  Substance and Sexual Activity  . Alcohol use: No    Alcohol/week: 0.0 standard drinks  . Drug use: No  . Sexual activity: Not on file  Other Topics Concern  . Not on file  Social History Narrative  . Not on file   Social Determinants of Health   Financial Resource Strain: Not on file  Food Insecurity: Not on file  Transportation Needs: Not on file  Physical Activity: Not on file  Stress: Not on file  Social Connections: Not on file  Intimate Partner Violence: Not on file   History reviewed. No pertinent family history.    VITAL SIGNS Temp (!) 97.3 F (36.3 C)   Resp 20   Ht 5' 8.5" (1.74 m)   Wt 242 lb 9.6 oz (110 kg)   SpO2 96%   BMI 36.35 kg/m   Outpatient Encounter Medications as of 08/06/2020  Medication Sig  . acetaminophen (TYLENOL) 325 MG tablet Take 650 mg by mouth every 6 (six) hours as needed for moderate pain.   Marland Kitchen apixaban (ELIQUIS) 5 MG TABS tablet Take 5 mg by mouth 2 (two) times daily. For dvt/pe/cva po  . atropine 1 % ophthalmic solution Place 1 drop under the tongue every 6 (six) hours as needed (excessive oral secretions).   Lucilla Lame [place]-Castor Oil (VENELEX) OINT Apply 1 application topically as directed. to left buttock qshift for prevention Day, Evening, Night  . bisacodyl (DULCOLAX) 10 MG suppository Place 1 suppository (10 mg total) rectally at bedtime.  . carboxymethylcellulose (REFRESH PLUS) 0.5 % SOLN Place 1 drop into the right eye in the morning, at noon, in the evening, and at bedtime.   Marland Kitchen erythromycin ophthalmic ointment Place 1 application into the right eye 2 (two) times daily. Also apply to right eye - dx corneal scar - continue until next ophthalmology visit At Bedtime  . insulin aspart (NOVOLOG FLEXPEN) 100 UNIT/ML FlexPen Inject  15 Units into the skin 2 (two) times daily with a meal. With Lunch and Supper  . Insulin Glargine (BASAGLAR KWIKPEN) 100 UNIT/ML SOPN Inject 31 Units into the skin at bedtime.   Marland Kitchen ipratropium-albuterol (DUONEB) 0.5-2.5 (3) MG/3ML SOLN Take 3 mLs by nebulization every 6 (six) hours.   Marland Kitchen linaclotide (LINZESS) 145 MCG CAPS capsule Take 145 mcg by mouth daily in the afternoon. Special Instructions: Give on an empty stomach at least before lunch. Do not crush. May open capsule and sprinkle contents on applesauce if unable to swallow capsule.  . loratadine (CLARITIN) 10 MG tablet Take  10 mg by mouth daily.  . magnesium hydroxide (MILK OF MAGNESIA) 400 MG/5ML suspension Take 30 mLs by mouth daily as needed for mild constipation.  . methimazole (TAPAZOLE) 5 MG tablet Take 2.5 mg by mouth daily.  . NON FORMULARY Diet- Liquids: Nectar Thick Liquids  Diet:Dysphagia 1 Puree  . OXYGEN Inhale 3 L into the lungs continuous. Every shift to maintain satuation >90%.  . Sodium Phosphates (FLEET ENEMA RE) Place 1 application rectally daily as needed.  . Vitamins A & D (VITAMIN A & D) ointment Apply 1 application topically in the morning, at noon, and at bedtime. To bilateral heels   No facility-administered encounter medications on file as of 08/06/2020.     SIGNIFICANT DIAGNOSTIC EXAMS   PREVIOUS;   05-09-19: ct of abdomen and pelvis:  1. Moderately large amount of stool in the distal sigmoid colon and rectum. Moderate gaseous distension of the more proximal sigmoid colon without evidence of volvulus or bowel obstruction. 2. Bibasilar lung consolidation concerning for pneumonia. 3. Aortic Atherosclerosis    05-10-20: chest x-ray:  1. Mild patchy bilateral lung opacities which are, in part, chronic in nature. A superimposed component of atelectasis and/or infiltrate cannot be excluded. Correlation with chest CT is recommended. 2. Small bilateral pleural effusions.  05-10-20 ct angio of chest:   1. No definite evidence of acute pulmonary embolism. Low-attenuation over the proximal right lower lobar artery as well as a posterior right upper lobe segmental pulmonary artery likely artifactual. Concern still remains for pulmonary embolism, . 2. Stable bibasilar consolidation likely atelectasis and not significant changed from the prior exam. Severe AP narrowing of the trachea at the level of the carina with severe narrowing of the proximal mainstem bronchi bilaterally. 1.3 cm right subcarinal lymph node likely reactive. 3. Cardiomegaly and atherosclerotic coronary artery disease. 4. Aortic atherosclerosis. Ectasia of the ascending thoracic aorta measuring 3.9 cm in AP diameter  05-10-20: ct of abdomen and pelvis:  1. Findings consistent with fecal impaction within the distal sigmoid colon with subsequent distal large bowel obstruction. 2. Stable moderate severity areas of bibasilar consolidation. 3. Cholelithiasis. 4. Sigmoid diverticulosis. Aortic Atherosclerosis  05-21-20: KUB:  Persistent sigmoid colonic distention again noted. Remainder of the colon is nondistended. Stool noted throughout the colon. No small bowel distention. No free air. Exam unchanged from prior exam.   NO NEW EXAMS   LABS REVIEWED: PREVIOUS   10-11-19: hgb a1c 7.2   11-05-19: tsh 2.340 01-31-20: wbc 8.3; hgb 13.2; hct 43.7; mcv 100.9 plt 271; glucose 122; bun 24; creat 1.10 ;k+ 4.0; na++ 141; ca 8.8 liver norm albumin 3.1 hgb a1c 7.9; chol 138; ldl 93; trig 57; hdl 34  05-08-20: hgb a1c 7.9; PSA  10.21 05-10-20: wbc 9.6; hgb 12.4; hct 43.6; mcv 106.3 plt 235; glucose 294; bun 36; creat 1.45; k+ 4.8; na++ 145; ca 8.7 liver normal albumin 3.2 urine culture: no growth: blood culture: staphylococcus capitis 05-12-20: wbc 15.7; hgb 11.5; hct 41.2; mcv 107.9 plt 191; glucose 175; bun 23; creat 1.25; k+ 3.1; na++ 147 ca 7.8 liver normal albumin 2.8 05-18-20: wbc 10.2; hgb 11.4; hct 41.9; mcv 108.8 plt 219; glucose 147;  bun 25; creat 1.17; k+ 4.6; na++ 147; ca 8.1  05/29/2020: wbc 7.6; hgb 11.4; hct 38.6; mcv 102.7; plt 284; glucose 111; bun 15; creat 0.90; k+ 3.5; na++ 140; ca 8.1 mag 2.0  06-19-20: tsh 0.795 free t4: 0.71; free t3: 2.1  NO NEW LABS.   Review of Systems  Unable  to perform ROS: Dementia (unable to participate )     Physical Exam Constitutional:      General: He is not in acute distress.    Appearance: He is well-developed and well-nourished. He is obese. He is not diaphoretic.  Neck:     Thyroid: No thyromegaly.  Cardiovascular:     Rate and Rhythm: Normal rate and regular rhythm.     Pulses: Normal pulses and intact distal pulses.     Heart sounds: Normal heart sounds.  Pulmonary:     Effort: Pulmonary effort is normal. No respiratory distress.     Breath sounds: Rhonchi present.     Comments: 02 Abdominal:     General: Bowel sounds are normal. There is no distension.     Palpations: Abdomen is soft.     Tenderness: There is no abdominal tenderness.  Musculoskeletal:        General: No edema.     Cervical back: Neck supple.     Right lower leg: No edema.     Left lower leg: No edema.     Comments: Right hemiplegia   Lymphadenopathy:     Cervical: No cervical adenopathy.  Skin:    General: Skin is warm and dry.  Neurological:     Mental Status: He is alert. Mental status is at baseline.  Psychiatric:        Mood and Affect: Mood and affect and mood normal.      ASSESSMENT/ PLAN:  TODAY  1. Hypertension associated with type 2 diabetes mellitus 2. 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 We will attempt to wean resident as appropriate.  Synthia Innocent  NP [facility]  Contact [phone] Monday through Friday 8am- 5pm  After hours call [phone]

## 2020-08-08 ENCOUNTER — Other Ambulatory Visit (HOSPITAL_COMMUNITY)
Admission: RE | Admit: 2020-08-08 | Discharge: 2020-08-08 | Disposition: A | Discharge status: Home / Self Care | Payer: Medicare Other | Source: Skilled Nursing Facility | Attending: Adult Health | Admitting: Adult Health

## 2020-08-08 DIAGNOSIS — E1149 Type 2 diabetes mellitus with other diabetic neurological complication: Secondary | ICD-10-CM | POA: Insufficient documentation

## 2020-08-08 DIAGNOSIS — I10 Essential (primary) hypertension: Secondary | ICD-10-CM | POA: Diagnosis not present

## 2020-08-08 LAB — BASIC METABOLIC PANEL
Anion gap: 7 (ref 5–15)
BUN: 24 mg/dL — ABNORMAL HIGH (ref 8–23)
CO2: 40 mmol/L — ABNORMAL HIGH (ref 22–32)
Calcium: 9 mg/dL (ref 8.9–10.3)
Chloride: 96 mmol/L — ABNORMAL LOW (ref 98–111)
Creatinine, Ser: 0.92 mg/dL (ref 0.61–1.24)
GFR, Estimated: >60 mL/min (ref >60)
Glucose, Bld: 83 mg/dL (ref 70–99)
Potassium: 4.2 mmol/L (ref 3.5–5.1)
Sodium: 143 mmol/L (ref 135–145)

## 2020-08-08 LAB — HEMOGLOBIN A1C
Hgb A1c MFr Bld: 7.5 % — ABNORMAL HIGH (ref 4.8–5.6)
Mean Plasma Glucose: 168.55 mg/dL

## 2020-08-11 DIAGNOSIS — I639 Cerebral infarction, unspecified: Secondary | ICD-10-CM | POA: Diagnosis not present

## 2020-08-11 DIAGNOSIS — Z515 Encounter for palliative care: Secondary | ICD-10-CM | POA: Diagnosis not present

## 2020-08-11 DIAGNOSIS — I69351 Hemiplegia and hemiparesis following cerebral infarction affecting right dominant side: Secondary | ICD-10-CM | POA: Diagnosis not present

## 2020-08-11 DIAGNOSIS — I69391 Dysphagia following cerebral infarction: Secondary | ICD-10-CM | POA: Diagnosis not present

## 2020-08-20 DIAGNOSIS — Z961 Presence of intraocular lens: Secondary | ICD-10-CM | POA: Diagnosis not present

## 2020-08-20 DIAGNOSIS — H04123 Dry eye syndrome of bilateral lacrimal glands: Secondary | ICD-10-CM | POA: Diagnosis not present

## 2020-08-20 DIAGNOSIS — H16212 Exposure keratoconjunctivitis, left eye: Secondary | ICD-10-CM | POA: Diagnosis not present

## 2020-08-22 ENCOUNTER — Non-Acute Institutional Stay (SKILLED_NURSING_FACILITY): Payer: Medicare Other | Admitting: Adult Health

## 2020-08-22 DIAGNOSIS — G8191 Hemiplegia, unspecified affecting right dominant side: Secondary | ICD-10-CM | POA: Diagnosis not present

## 2020-08-22 DIAGNOSIS — J410 Simple chronic bronchitis: Secondary | ICD-10-CM

## 2020-08-22 DIAGNOSIS — E1159 Type 2 diabetes mellitus with other circulatory complications: Secondary | ICD-10-CM

## 2020-08-22 DIAGNOSIS — I152 Hypertension secondary to endocrine disorders: Secondary | ICD-10-CM | POA: Diagnosis not present

## 2020-08-22 DIAGNOSIS — I679 Cerebrovascular disease, unspecified: Secondary | ICD-10-CM

## 2020-08-22 DIAGNOSIS — I639 Cerebral infarction, unspecified: Secondary | ICD-10-CM | POA: Diagnosis not present

## 2020-08-22 NOTE — Progress Notes (Signed)
Location:  [facility] Room Number: 222 Place of Service:  SNF (31)   CODE STATUS: dnr  Allergies  Allergen Reactions  . Penicillin G   . Penicillins     Has patient had a PCN reaction causing immediate rash, facial/tongue/throat swelling, SOB or lightheadedness with hypotension: unknown Has patient had a PCN reaction causing severe rash involving mucus membranes or skin necrosis: unknown Has patient had a PCN reaction that required hospitalization: unknown Has patient had a PCN reaction occurring within the last 10 years: unknown If all of the above answers are "NO", then may proceed with Cephalosporin use. 5/3 Tolerated Cefepime x 1 dose in ED.     Chief Complaint  Patient presents with  . 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 There are no reports of acute aspiratoin. No reports of constipation; no reports of pain present.   Past Medical History:  Diagnosis Date  . Acute respiratory failure with hypoxia [facility])    Per records at [facility] EMR system, [employer]   . Constipation    Per records at [facility] EMR system, [employer]   . Dependence on supplemental oxygen    Per records at [facility] EMR system, [employer]   . DVT (deep venous thrombosis) (HCC) 09/26/2012  . Dysphasia   . Elevated WBC count    Per records at [facility] EMR system, [employer]   . Fatty liver   . Fever, unspecified    Per records at [facility]  EMR system, [employer]   . Hemiplegia (HCC)   . Hemorrhoids    Per records at [facility] EMR system, [employer]   . Hyperlipidemia    Per records at [facility] EMR system, [employer]   . Hyperosmolality and hypernatremia    Per records at [facility] EMR system, [employer]   . Hypertension   . Lack of coordination    Per records at [facility] EMR system, [employer]   . Muscle weakness    Per records at [facility] EMR system, [employer]   . Nasal congestion    Per records at [facility] EMR system, [employer]   . Need for assistance with personal care    Per records at [facility] EMR system, [employer]   . 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 Pneumonia   . Pulmonary embolism (HCC)   . Sepsis, unspecified organism [facility])    Per records at [facility] EMR system, [employer]   . 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(HCC)   . Stroke (HCC)   . Type 2 diabetes mellitus with hyperglycemia (HCC)    Per records at [facility] EMR system, [employer]   . Unspecified corneal scar and opacity    Per records at [facility] EMR system, [employer]   . UTI (urinary tract infection)  Per records at [facility] EMR system, [employer]     Past Surgical History:  Procedure Laterality Date  . ESOPHAGOGASTRODUODENOSCOPY (EGD) WITH PROPOFOL N/A 05/15/2019   Procedure: ESOPHAGOGASTRODUODENOSCOPY (EGD) WITH PROPOFOL;  Surgeon: Lucretia Roers, MD;  Location: [facility] ORS;  Service: General;  Laterality: N/A;  . FLEXIBLE SIGMOIDOSCOPY N/A 10/21/2016   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with successful decompression, few divertiula at sigmoid  . FLEXIBLE SIGMOIDOSCOPY N/A 10/25/2016   sigmoid volvulus with successful decompression, dilated sigmoid colon.   Marland Kitchen PEG PLACEMENT N/A 05/15/2019   Procedure: PERCUTANEOUS ENDOSCOPIC GASTROSTOMY (PEG) PLACEMENT;  Surgeon: Lucretia Roers, MD;  Location: [facility];  Service: General;   Laterality: N/A;  . unable      Social History   Socioeconomic History  . Marital status: Single    Spouse name: Not on file  . Number of children: Not on file  . Years of education: Not on file  . Highest education level: Not on file  Occupational History  . Not on file  Tobacco Use  . Smoking status: Never Smoker  . Smokeless tobacco: Never Used  Vaping Use  . Vaping Use: Never used  Substance and Sexual Activity  . Alcohol use: No    Alcohol/week: 0.0 standard drinks  . Drug use: No  . Sexual activity: Not on file  Other Topics Concern  . Not on file  Social History Narrative  . Not on file   Social Determinants of Health   Financial Resource Strain: Not on file  Food Insecurity: Not on file  Transportation Needs: Not on file  Physical Activity: Not on file  Stress: Not on file  Social Connections: Not on file  Intimate Partner Violence: Not on file   No family history on file.    VITAL SIGNS BP 130/86   Pulse 80   Temp (!) 97.3 F (36.3 C)   Resp 20   Ht 5' 8.5" (1.74 m)   Wt 237 lb 9.6 oz (107.8 kg)   SpO2 96%   BMI 35.60 kg/m   Outpatient Encounter Medications as of 08/22/2020  Medication Sig  . acetaminophen (TYLENOL) 325 MG tablet Take 650 mg by mouth every 6 (six) hours as needed for moderate pain.   Marland Kitchen apixaban (ELIQUIS) 5 MG TABS tablet Take 5 mg by mouth 2 (two) times daily. For dvt/pe/cva po  . atropine 1 % ophthalmic solution Place 1 drop under the tongue every 6 (six) hours as needed (excessive oral secretions).   Lucilla Lame [place]-Castor Oil (VENELEX) OINT Apply 1 application topically as directed. to left buttock qshift for prevention Day, Evening, Night  . bisacodyl (DULCOLAX) 10 MG suppository Place 1 suppository (10 mg total) rectally at bedtime.  . carboxymethylcellulose (REFRESH PLUS) 0.5 % SOLN Place 1 drop into the right eye in the morning, at noon, in the evening, and at bedtime.   Marland Kitchen erythromycin ophthalmic ointment Place 1 application  into the right eye 2 (two) times daily. Also apply to right eye - dx corneal scar - continue until next ophthalmology visit At Bedtime  . insulin aspart (NOVOLOG FLEXPEN) 100 UNIT/ML FlexPen Inject 15 Units into the skin 2 (two) times daily with a meal. With Lunch and Supper  . Insulin Glargine (BASAGLAR KWIKPEN) 100 UNIT/ML SOPN Inject 31 Units into the skin at bedtime.   Marland Kitchen ipratropium-albuterol (DUONEB) 0.5-2.5 (3) MG/3ML SOLN Take 3 mLs by nebulization every 6 (six) hours.   Marland Kitchen linaclotide (LINZESS) 145 MCG CAPS capsule Take 145 mcg by  mouth daily in the afternoon. Special Instructions: Give on an empty stomach at least before lunch. Do not crush. May open capsule and sprinkle contents on applesauce if unable to swallow capsule.  . loratadine (CLARITIN) 10 MG tablet Take 10 mg by mouth daily.  . magnesium hydroxide (MILK OF MAGNESIA) 400 MG/5ML suspension Take 30 mLs by mouth daily as needed for mild constipation.  . methimazole (TAPAZOLE) 5 MG tablet Take 2.5 mg by mouth daily.  . NON FORMULARY Diet- Liquids: Nectar Thick Liquids  Diet:Dysphagia 1 Puree  . OXYGEN Inhale 3 L into the lungs continuous. Every shift to maintain satuation >90%.  . Sodium Phosphates (FLEET ENEMA RE) Place 1 application rectally daily as needed.  . Vitamins A & D (VITAMIN A & D) ointment Apply 1 application topically in the morning, at noon, and at bedtime. To bilateral heels   No facility-administered encounter medications on file as of 08/22/2020.     SIGNIFICANT DIAGNOSTIC EXAMS   PREVIOUS;   05-10-20 ct angio of chest:  1. No definite evidence of acute pulmonary embolism. Low-attenuation over the proximal right lower lobar artery as well as a posterior right upper lobe segmental pulmonary artery likely artifactual. Concern still remains for pulmonary embolism, . 2. Stable bibasilar consolidation likely atelectasis and not significant changed from the prior exam. Severe AP narrowing of the trachea  at the level of the carina with severe narrowing of the proximal mainstem bronchi bilaterally. 1.3 cm right subcarinal lymph node likely reactive. 3. Cardiomegaly and atherosclerotic coronary artery disease. 4. Aortic atherosclerosis. Ectasia of the ascending thoracic aorta measuring 3.9 cm in AP diameter  05-10-20: ct of abdomen and pelvis:  1. Findings consistent with fecal impaction within the distal sigmoid colon with subsequent distal large bowel obstruction. 2. Stable moderate severity areas of bibasilar consolidation. 3. Cholelithiasis. 4. Sigmoid diverticulosis. Aortic Atherosclerosis  05-21-20: KUB:  Persistent sigmoid colonic distention again noted. Remainder of the colon is nondistended. Stool noted throughout the colon. No small bowel distention. No free air. Exam unchanged from prior exam.   NO NEW EXAMS   LABS REVIEWED: PREVIOUS   10-11-19: hgb a1c 7.2   11-05-19: tsh 2.340 01-31-20: wbc 8.3; hgb 13.2; hct 43.7; mcv 100.9 plt 271; glucose 122; bun 24; creat 1.10 ;k+ 4.0; na++ 141; ca 8.8 liver norm albumin 3.1 hgb a1c 7.9; chol 138; ldl 93; trig 57; hdl 34  05-08-20: hgb a1c 7.9; PSA  10.21 05-10-20: wbc 9.6; hgb 12.4; hct 43.6; mcv 106.3 plt 235; glucose 294; bun 36; creat 1.45; k+ 4.8; na++ 145; ca 8.7 liver normal albumin 3.2 urine culture: no growth: blood culture: staphylococcus capitis 05-12-20: wbc 15.7; hgb 11.5; hct 41.2; mcv 107.9 plt 191; glucose 175; bun 23; creat 1.25; k+ 3.1; na++ 147 ca 7.8 liver normal albumin 2.8 05-18-20: wbc 10.2; hgb 11.4; hct 41.9; mcv 108.8 plt 219; glucose 147; bun 25; creat 1.17; k+ 4.6; na++ 147; ca 8.1  June 06, 2020: wbc 7.6; hgb 11.4; hct 38.6; mcv 102.7; plt 284; glucose 111; bun 15; creat 0.90; k+ 3.5; na++ 140; ca 8.1 mag 2.0   TODAY  06-19-20: tsh 0.795 free t4: 0.71; free t3: 2.1 08-08-20: glucose 83; bun 25; creat 0.92; k+ 4.2; n++ 143; ca 9.0; GFR >60   Review of Systems  Unable to perform ROS: Dementia (unable to participate )     Physical Exam Constitutional:      General: He is not in acute distress.    Appearance: He is well-developed  and well-nourished. He is obese. He is not diaphoretic.  Neck:     Thyroid: No thyromegaly.  Cardiovascular:     Rate and Rhythm: Normal rate and regular rhythm.     Pulses: Normal pulses and intact distal pulses.     Heart sounds: Normal heart sounds.  Pulmonary:     Effort: Pulmonary effort is normal. No respiratory distress.     Breath sounds: Rhonchi present.     Comments: 02 few scattered rhonchi  Abdominal:     General: Bowel sounds are normal. There is distension.     Palpations: Abdomen is soft.     Tenderness: There is no abdominal tenderness.  Musculoskeletal:        General: No edema.     Cervical back: Neck supple.     Right lower leg: No edema.     Left lower leg: No edema.     Comments: Right hemiplegia   Lymphadenopathy:     Cervical: No cervical adenopathy.  Skin:    General: Skin is warm and dry.  Neurological:     Mental Status: He is alert. Mental status is at baseline.  Psychiatric:        Mood and Affect: Mood and affect and mood normal.    ASSESSMENT/ PLAN:  TODAY:  1. Simple bronchitis is stable 02 dependent will continue claritin 10 mg daily is unable to use inhalers; will continue to monitor his status.   2. Hypertension associated with type 2 diabetes mellitus is stable 130/86 will continue to monitor his status he is off beta blocker and ACE  3. Cerebrovascular accident (CVA) due to other mechanism / hemiplegia of right dominant side due to cerebrovascular disease is stable will continue eliquis 5 mg twice daily     PREVIOUS    4. Hyperlipidemia associated with type 2 diabetes mellitus: is stable LDL 93 will continue to monitor his status.   5. Hyperthyroidism: Is stable tsh 2.340; free t4: 0.66 will continue tapazole 2.5 mg daily   6. Deep vein thrombosis (DVT) of left lower extremity unspecified vein/other pulmonary embolism  without acuate cor pulmonale is on long term eliquis 5 mg twice daily   7. uncontrolled type 2 diabetes mellitus with neurologic complication with long term current use of insulin: hgb a1c 7.5 will continue basaglar 31 units nightly novolog 15 units with lunch and supper; is no longer on ace and statin.  8. Elevated PSA: is stable 10.21 will monitor  9. Dementia without behavioral disturbance unspecified dementia type:  without change weight is 237 will monitor   10. 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/ large bowel obstruction: is stable has mild abdominal distention; will continue lenzess 145 mcg daily cannot take miralax due to thickened liquids.   11. 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   12. Aortic atherosclerosis: (ct 05-09-19) is stable is on long term anticoagulation therapy and blood pressure medications.       Synthia Innocenteborah Dion Sibal NP [facility]  Contact [phone] Monday through Friday 8am- 5pm  After hours call [phone]

## 2020-08-27 ENCOUNTER — Non-Acute Institutional Stay (SKILLED_NURSING_FACILITY): Payer: Medicare Other | Admitting: Adult Health

## 2020-08-27 ENCOUNTER — Encounter: Payer: Self-pay | Admitting: Adult Health

## 2020-08-27 DIAGNOSIS — J410 Simple chronic bronchitis: Secondary | ICD-10-CM

## 2020-08-27 DIAGNOSIS — I700 Atherosclerosis of aorta: Secondary | ICD-10-CM

## 2020-08-27 DIAGNOSIS — K562 Volvulus: Secondary | ICD-10-CM

## 2020-08-27 DIAGNOSIS — F0390 Unspecified dementia without behavioral disturbance: Secondary | ICD-10-CM | POA: Diagnosis not present

## 2020-08-27 NOTE — Progress Notes (Signed)
Location:  [facility] Room Number: 222 Place of Service:  SNF (31)   CODE STATUS: dnr   Allergies  Allergen Reactions  . Penicillin G   . Penicillins     Has patient had a PCN reaction causing immediate rash, facial/tongue/throat swelling, SOB or lightheadedness with hypotension: unknown Has patient had a PCN reaction causing severe rash involving mucus membranes or skin necrosis: unknown Has patient had a PCN reaction that required hospitalization: unknown Has patient had a PCN reaction occurring within the last 10 years: unknown If all of the above answers are "NO", then may proceed with Cephalosporin use. 5/3 Tolerated Cefepime x 1 dose in ED.     Chief Complaint  Patient presents with  . Acute Visit    Care plan meeting.     HPI:  We have come together for his care plan meeting. No BIMS mood 0/30. She is extensive assist to dependent with his adls he requires assist with meals. He is incontinent of bladder and bowel. He has had 2 falls from bed 07-05-20: eye redness 08-22-20 no injury he was found face down each time. His cbg readings are under control. Weight is 242.6 pounds; and is on nectar thick liquids.   He continues to be followed for his chronic illnesses including: Aortic atherosclerosis  Simple chronic bronchitis  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Dementia without behavioral disturbance unspecified dementia type    Past Medical History:  Diagnosis Date  . Acute respiratory failure with hypoxia [facility])    Per records at [facility] EMR system, [facility]   . Constipation    Per records at [facility] EMR system, [facility]   . Dependence on supplemental oxygen    Per records at [facility] EMR system, [employer]   . DVT (deep venous thrombosis) (HCC) 09/26/2012  . Dysphasia   . Elevated WBC count    Per records at [facility] EMR system, Matrix   . Fatty liver   . Fever, unspecified    Per records at [facility] EMR system,  Matrix   . Hemiplegia (HCC)   . Hemorrhoids    Per records at [facility] EMR system, Matrix   . Hyperlipidemia    Per records at [facility] EMR system, Matrix   . Hyperosmolality and hypernatremia    Per records at [facility] EMR system, Matrix   . Hypertension   . Lack of coordination    Per records at [facility] EMR system, Matrix   . Muscle weakness    Per records at [facility] EMR system, Matrix   . Nasal congestion    Per records at [facility] EMR system, Matrix   . Need for assistance with personal care    Per records at [facility] EMR system, Matrix   . 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 Pneumonia   . Pulmonary embolism (HCC)   . Sepsis, unspecified organism [facility])    Per records at [facility] EMR system, Matrix   . 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(HCC)   . Stroke (HCC)   . Type 2 diabetes mellitus with hyperglycemia (HCC)    Per records at [facility] EMR system, Matrix   . Unspecified corneal scar and opacity    Per records at [facility] EMR system, Matrix   . UTI (  urinary tract infection)    [facility] EMR [facility], [facility]     Past Surgical History:  Procedure Laterality Date  . ESOPHAGOGASTRODUODENOSCOPY (EGD) WITH PROPOFOL N/A 05/15/2019   Procedure: ESOPHAGOGASTRODUODENOSCOPY (EGD) WITH PROPOFOL;  Surgeon: Lucretia RoersBridges, Lindsay C, MD;  Location: [facility] ORS;  Service: General;  Laterality: N/A;  . FLEXIBLE SIGMOIDOSCOPY N/A 10/21/2016   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with successful decompression, few divertiula at sigmoid  . FLEXIBLE SIGMOIDOSCOPY N/A 10/25/2016   sigmoid volvulus with successful decompression, dilated sigmoid colon.   Marland Kitchen. PEG PLACEMENT N/A 05/15/2019   Procedure: PERCUTANEOUS ENDOSCOPIC GASTROSTOMY (PEG) PLACEMENT;  Surgeon: Lucretia RoersBridges, Lindsay C, MD;  Location: [facility];  Service: General;  Laterality:  N/A;  . unable      Social History   Socioeconomic History  . Marital status: Single    Spouse name: Not on file  . Number of children: Not on file  . Years of education: Not on file  . Highest education level: Not on file  Occupational History  . Not on file  Tobacco Use  . Smoking status: Never Smoker  . Smokeless tobacco: Never Used  Vaping Use  . Vaping Use: Never used  Substance and Sexual Activity  . Alcohol use: No    Alcohol/week: 0.0 standard drinks  . Drug use: No  . Sexual activity: Not on file  Other Topics Concern  . Not on file  Social History Narrative  . Not on file   Social Determinants of Health   Financial Resource Strain: Not on file  Food Insecurity: Not on file  Transportation Needs: Not on file  Physical Activity: Not on file  Stress: Not on file  Social Connections: Not on file  Intimate Partner Violence: Not on file   History reviewed. No pertinent family history.    VITAL SIGNS BP (!) 118/56   Pulse 88   Temp (!) 97.2 F (36.2 C)   Resp 20   Ht 5' 8.5" (1.74 m)   Wt 237 lb 9.6 oz (107.8 kg)   SpO2 96%   BMI 35.60 kg/m   Outpatient Encounter Medications as of 08/27/2020  Medication Sig  . acetaminophen (TYLENOL) 325 MG tablet Take 650 mg by mouth every 6 (six) hours as needed for moderate pain.   Marland Kitchen. apixaban (ELIQUIS) 5 MG TABS tablet Take 5 mg by mouth 2 (two) times daily. For dvt/pe/cva po  . atropine 1 % ophthalmic solution Place 1 drop under the tongue every 6 (six) hours as needed (excessive oral secretions).   Lucilla Lame. Balsam [place]-Castor Oil (VENELEX) OINT Apply 1 application topically as directed. to left buttock qshift for prevention Day, Evening, Night  . bisacodyl (DULCOLAX) 10 MG suppository Place 1 suppository (10 mg total) rectally at bedtime.  . carboxymethylcellulose (REFRESH PLUS) 0.5 % SOLN Place 1 drop into the right eye in the morning, at noon, in the evening, and at bedtime.   Marland Kitchen. erythromycin ophthalmic ointment Place 1  application into the right eye 2 (two) times daily. Also apply to right eye - dx corneal scar - continue until next ophthalmology visit At Bedtime  . insulin aspart (NOVOLOG FLEXPEN) 100 UNIT/ML FlexPen Inject 15 Units into the skin 2 (two) times daily with a meal. With Lunch and Supper  . Insulin Glargine (BASAGLAR KWIKPEN) 100 UNIT/ML SOPN Inject 31 Units into the skin at bedtime.   Marland Kitchen. ipratropium-albuterol (DUONEB) 0.5-2.5 (3) MG/3ML SOLN Take 3 mLs by nebulization every 6 (six) hours.   Marland Kitchen. linaclotide Karlene Einstein(LINZESS)  145 MCG CAPS capsule Take 145 mcg by mouth daily in the afternoon. Special Instructions: Give on an empty stomach at least before lunch. Do not crush. May open capsule and sprinkle contents on applesauce if unable to swallow capsule.  . loratadine (CLARITIN) 10 MG tablet Take 10 mg by mouth daily.  . magnesium hydroxide (MILK OF MAGNESIA) 400 MG/5ML suspension Take 30 mLs by mouth daily as needed for mild constipation.  . methimazole (TAPAZOLE) 5 MG tablet Take 2.5 mg by mouth daily.  . NON FORMULARY Diet- Liquids: Nectar Thick Liquids  Diet:Dysphagia 1 Puree  . OXYGEN Inhale 3 L into the lungs continuous. Every shift to maintain satuation >90%.  . Sodium Phosphates (FLEET ENEMA RE) Place 1 application rectally daily as needed.  . Vitamins A & D (VITAMIN A & D) ointment Apply 1 application topically in the morning, at noon, and at bedtime. To bilateral heels   No facility-administered encounter medications on file as of 08/27/2020.     SIGNIFICANT DIAGNOSTIC EXAMS  PREVIOUS;   05-10-20 ct angio of chest:  1. No definite evidence of acute pulmonary embolism. Low-attenuation over the proximal right lower lobar artery as well as a posterior right upper lobe segmental pulmonary artery likely artifactual. Concern still remains for pulmonary embolism, . 2. Stable bibasilar consolidation likely atelectasis and not significant changed from the prior exam. Severe AP narrowing of the  trachea at the level of the carina with severe narrowing of the proximal mainstem bronchi bilaterally. 1.3 cm right subcarinal lymph node likely reactive. 3. Cardiomegaly and atherosclerotic coronary artery disease. 4. Aortic atherosclerosis. Ectasia of the ascending thoracic aorta measuring 3.9 cm in AP diameter  05-10-20: ct of abdomen and pelvis:  1. Findings consistent with fecal impaction within the distal sigmoid colon with subsequent distal large bowel obstruction. 2. Stable moderate severity areas of bibasilar consolidation. 3. Cholelithiasis. 4. Sigmoid diverticulosis. Aortic Atherosclerosis  05-21-20: KUB:  Persistent sigmoid colonic distention again noted. Remainder of the colon is nondistended. Stool noted throughout the colon. No small bowel distention. No free air. Exam unchanged from prior exam.   NO NEW EXAMS   LABS REVIEWED: PREVIOUS   10-11-19: hgb a1c 7.2   11-05-19: tsh 2.340 01-31-20: wbc 8.3; hgb 13.2; hct 43.7; mcv 100.9 plt 271; glucose 122; bun 24; creat 1.10 ;k+ 4.0; na++ 141; ca 8.8 liver norm albumin 3.1 hgb a1c 7.9; chol 138; ldl 93; trig 57; hdl 34  05-08-20: hgb a1c 7.9; PSA  10.21 05-10-20: wbc 9.6; hgb 12.4; hct 43.6; mcv 106.3 plt 235; glucose 294; bun 36; creat 1.45; k+ 4.8; na++ 145; ca 8.7 liver normal albumin 3.2 urine culture: no growth: blood culture: staphylococcus capitis 05-12-20: wbc 15.7; hgb 11.5; hct 41.2; mcv 107.9 plt 191; glucose 175; bun 23; creat 1.25; k+ 3.1; na++ 147 ca 7.8 liver normal albumin 2.8 05-18-20: wbc 10.2; hgb 11.4; hct 41.9; mcv 108.8 plt 219; glucose 147; bun 25; creat 1.17; k+ 4.6; na++ 147; ca 8.1  06/09/2020: wbc 7.6; hgb 11.4; hct 38.6; mcv 102.7; plt 284; glucose 111; bun 15; creat 0.90; k+ 3.5; na++ 140; ca 8.1 mag 2.0  06-19-20: tsh 0.795 free t4: 0.71; free t3: 2.1 08-08-20: glucose 83; bun 25; creat 0.92; k+ 4.2; n++ 143; ca 9.0; GFR >60  NO NEW LABS.    Review of Systems  Unable to perform ROS: Dementia (unable to  participate )    Physical Exam Constitutional:      General: He is not in  acute distress.    Appearance: He is well-developed and well-nourished. He is obese. He is not diaphoretic.  Neck:     Thyroid: No thyromegaly.  Cardiovascular:     Rate and Rhythm: Normal rate and regular rhythm.     Pulses: Normal pulses and intact distal pulses.     Heart sounds: Normal heart sounds.  Pulmonary:     Effort: Pulmonary effort is normal. No respiratory distress.     Breath sounds: Wheezing present.     Comments: Few scattered 02 dependent  Abdominal:     General: Bowel sounds are normal. There is no distension.     Palpations: Abdomen is soft.     Tenderness: There is no abdominal tenderness.  Musculoskeletal:        General: No edema.     Cervical back: Neck supple.     Right lower leg: No edema.     Left lower leg: No edema.     Comments: Right hemiplegia   Lymphadenopathy:     Cervical: No cervical adenopathy.  Skin:    General: Skin is warm and dry.  Neurological:     Mental Status: He is alert. Mental status is at baseline.  Psychiatric:        Mood and Affect: Mood and affect and mood normal.       ASSESSMENT/ PLAN:  TODAY  1. Aortic atherosclerosis  2. Simple chronic bronchitis 3. 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4. Dementia without behavioral disturbance unspecified dementia type  Will continue current medications Will continue current plan of care Will continue to monitor his status.    Synthia Innocent NP [facility] Adult [facility]  Contact [phone] Monday through Friday 8am- 5pm  After hours call [phone]

## 2020-09-01 ENCOUNTER — Non-Acute Institutional Stay (SKILLED_NURSING_FACILITY): Payer: Medicare Other | Admitting: Adult Health

## 2020-09-01 ENCOUNTER — Encounter: Payer: Self-pay | Admitting: Adult Health

## 2020-09-01 DIAGNOSIS — Z794 Long term (current) use of insulin: Secondary | ICD-10-CM | POA: Diagnosis not present

## 2020-09-01 DIAGNOSIS — E1149 Type 2 diabetes mellitus with other diabetic neurological complication: Secondary | ICD-10-CM | POA: Diagnosis not present

## 2020-09-01 DIAGNOSIS — E1165 Type 2 diabetes mellitus with hyperglycemia: Secondary | ICD-10-CM

## 2020-09-01 DIAGNOSIS — IMO0002 Reserved for concepts with insufficient information to code with codable children: Secondary | ICD-10-CM

## 2020-09-01 NOTE — Progress Notes (Signed)
Location:  [facility] Room Number: 222 Place of Service:  SNF (31)   CODE STATUS: dnr  Allergies  Allergen Reactions  . Penicillin G   . Penicillins     Has patient had a PCN reaction causing immediate rash, facial/tongue/throat swelling, SOB or lightheadedness with hypotension: unknown Has patient had a PCN reaction causing severe rash involving mucus membranes or skin necrosis: unknown Has patient had a PCN reaction that required hospitalization: unknown Has patient had a PCN reaction occurring within the last 10 years: unknown If all of the above answers are "NO", then may proceed with Cephalosporin use. 5/3 Tolerated Cefepime x 1 dose in ED.     Chief Complaint  Patient presents with  . Acute Visit    Diabetes     HPI:  He has had several low cbg readings in the past couple of weeks with the lowest at 55. Nursing did treat the 55 with food intake. There are no reports of changes in appetite; no reports of missed doses. There are no reports of diaphoresis or agitation.   Past Medical History:  Diagnosis Date  . Acute respiratory failure with hypoxia [facility](HCC)    [facility] EMR system, [employer]   . Constipation    [facility], [facility]   . Dependence on supplemental oxygen    [facility] E[facility], [facility]   . DVT (deep venous thrombosis) (HCC) 09/26/2012  . Dysphasia   . Elevated WBC count    [facility], [facility]   . Fatty liver   . Fever, unspecified    [facility] EMR [facility], [facility]   . Hemiplegia (HCC)   . Hemorrhoids    [facility], [facility]   . Hyperlipidemia    [facility] EMR [facility], [facility]   . Hyperosmolality and hypernatremia    [facility], [facility]   . Hypertension   . Lack of coordination    [facility] EMR [facility], [facility]   . Muscle weakness    [facility] EMR [facility], [facility]   . Nasal congestion    [facility] EMR [facility], [facility]   . Need for assistance with personal care    [facility] EMR [facility], [employer]   . 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 Pneumonia   . Pulmonary embolism (HCC)   . Sepsis, unspecified organism [facility](HCC)    [facility] EMR [facility], [facility]   . 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(HCC)   . Stroke (HCC)   . Type 2 diabetes mellitus with hyperglycemia (HCC)    [facility], [facility]   . Unspecified corneal scar and opacity    [facility], [facility]   . UTI (urinary tract infection)    [facility], [facility]     Past Surgical History:  Procedure Laterality Date  . ESOPHAGOGASTRODUODENOSCOPY (EGD) WITH PROPOFOL N/A 05/15/2019   Procedure: ESOPHAGOGASTRODUODENOSCOPY (EGD) WITH PROPOFOL;  Surgeon: Lucretia RoersBridges, Lindsay C, MD;  Location: [facility];  Service: General;  Laterality:  N/A;  . FLEXIBLE SIGMOIDOSCOPY N/A 10/21/2016   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with successful decompression, few divertiula at sigmoid  . FLEXIBLE SIGMOIDOSCOPY N/A 10/25/2016   sigmoid volvulus with successful decompression, dilated sigmoid colon.   Marland Kitchen PEG PLACEMENT N/A 05/15/2019   Procedure: PERCUTANEOUS ENDOSCOPIC GASTROSTOMY (PEG) PLACEMENT;  Surgeon: Lucretia Roers, MD;  Location: [facility];  Service: General;  Laterality: N/A;  . unable      Social History   Socioeconomic History  . Marital status: Single    Spouse name: Not on file  . Number of children: Not on file  . Years of education: Not on file  . Highest education level: Not on file  Occupational History  . Not on file  Tobacco Use  . Smoking status: Never Smoker  . Smokeless tobacco:  Never Used  Vaping Use  . Vaping Use: Never used  Substance and Sexual Activity  . Alcohol use: No    Alcohol/week: 0.0 standard drinks  . Drug use: No  . Sexual activity: Not on file  Other Topics Concern  . Not on file  Social History Narrative  . Not on file   Social Determinants of Health   Financial Resource Strain: Not on file  Food Insecurity: Not on file  Transportation Needs: Not on file  Physical Activity: Not on file  Stress: Not on file  Social Connections: Not on file  Intimate Partner Violence: Not on file   History reviewed. No pertinent family history.    VITAL SIGNS BP 133/75   Pulse 87   Temp 98.6 F (37 C)   Resp 20   Ht 5' 8.5" (1.74 m)   Wt 237 lb 9.6 oz (107.8 kg)   SpO2 94%   BMI 35.60 kg/m   Outpatient Encounter Medications as of 09/01/2020  Medication Sig  . acetaminophen (TYLENOL) 325 MG tablet Take 650 mg by mouth every 6 (six) hours as needed for moderate pain.   Marland Kitchen apixaban (ELIQUIS) 5 MG TABS tablet Take 5 mg by mouth 2 (two) times daily. For dvt/pe/cva po  . atropine 1 % ophthalmic solution Place 1 drop under the tongue every 6 (six) hours as needed (excessive oral secretions).   Lucilla Lame [place]-Castor Oil (VENELEX) OINT Apply 1 application topically as directed. to left buttock qshift for prevention Day, Evening, Night  . bisacodyl (DULCOLAX) 10 MG suppository Place 1 suppository (10 mg total) rectally at bedtime.  . carboxymethylcellulose (REFRESH PLUS) 0.5 % SOLN Place 1 drop into the right eye in the morning, at noon, in the evening, and at bedtime.   Marland Kitchen erythromycin ophthalmic ointment Place 1 application into the right eye 2 (two) times daily. Also apply to right eye - dx corneal scar - continue until next ophthalmology visit At Bedtime  . insulin aspart (NOVOLOG FLEXPEN) 100 UNIT/ML FlexPen Inject 15 Units into the skin 2 (two) times daily with a meal. With Lunch and Supper  . Insulin Glargine (BASAGLAR KWIKPEN) 100 UNIT/ML SOPN  Inject 31 Units into the skin at bedtime.   Marland Kitchen ipratropium-albuterol (DUONEB) 0.5-2.5 (3) MG/3ML SOLN Take 3 mLs by nebulization every 6 (six) hours.   Marland Kitchen linaclotide (LINZESS) 145 MCG CAPS capsule Take 145 mcg by mouth daily in the afternoon. Special Instructions: Give on an empty stomach at least before lunch. Do not crush. May open capsule and sprinkle contents on applesauce if unable to swallow capsule.  . loratadine (CLARITIN) 10 MG tablet Take 10 mg by mouth daily.  . magnesium hydroxide (  MILK OF MAGNESIA) 400 MG/5ML suspension Take 30 mLs by mouth daily as needed for mild constipation.  . methimazole (TAPAZOLE) 5 MG tablet Take 2.5 mg by mouth daily.  . NON FORMULARY Diet- Liquids: Nectar Thick Liquids  Diet:Dysphagia 1 Puree  . OXYGEN Inhale 3 L into the lungs continuous. Every shift to maintain satuation >90%.  . Sodium Phosphates (FLEET ENEMA RE) Place 1 application rectally daily as needed.  . Vitamins A & D (VITAMIN A & D) ointment Apply 1 application topically in the morning, at noon, and at bedtime. To bilateral heels   No facility-administered encounter medications on file as of 09/01/2020.     SIGNIFICANT DIAGNOSTIC EXAMS   PREVIOUS;   05-10-20 ct angio of chest:  1. No definite evidence of acute pulmonary embolism. Low-attenuation over the proximal right lower lobar artery as well as a posterior right upper lobe segmental pulmonary artery likely artifactual. Concern still remains for pulmonary embolism, . 2. Stable bibasilar consolidation likely atelectasis and not significant changed from the prior exam. Severe AP narrowing of the trachea at the level of the carina with severe narrowing of the proximal mainstem bronchi bilaterally. 1.3 cm right subcarinal lymph node likely reactive. 3. Cardiomegaly and atherosclerotic coronary artery disease. 4. Aortic atherosclerosis. Ectasia of the ascending thoracic aorta measuring 3.9 cm in AP diameter  05-10-20: ct of abdomen  and pelvis:  1. Findings consistent with fecal impaction within the distal sigmoid colon with subsequent distal large bowel obstruction. 2. Stable moderate severity areas of bibasilar consolidation. 3. Cholelithiasis. 4. Sigmoid diverticulosis. Aortic Atherosclerosis  05-21-20: KUB:  Persistent sigmoid colonic distention again noted. Remainder of the colon is nondistended. Stool noted throughout the colon. No small bowel distention. No free air. Exam unchanged from prior exam.   NO NEW EXAMS   LABS REVIEWED: PREVIOUS   10-11-19: hgb a1c 7.2   11-05-19: tsh 2.340 01-31-20: wbc 8.3; hgb 13.2; hct 43.7; mcv 100.9 plt 271; glucose 122; bun 24; creat 1.10 ;k+ 4.0; na++ 141; ca 8.8 liver norm albumin 3.1 hgb a1c 7.9; chol 138; ldl 93; trig 57; hdl 34  05-08-20: hgb a1c 7.9; PSA  10.21 05-10-20: wbc 9.6; hgb 12.4; hct 43.6; mcv 106.3 plt 235; glucose 294; bun 36; creat 1.45; k+ 4.8; na++ 145; ca 8.7 liver normal albumin 3.2 urine culture: no growth: blood culture: staphylococcus capitis 05-12-20: wbc 15.7; hgb 11.5; hct 41.2; mcv 107.9 plt 191; glucose 175; bun 23; creat 1.25; k+ 3.1; na++ 147 ca 7.8 liver normal albumin 2.8 05-18-20: wbc 10.2; hgb 11.4; hct 41.9; mcv 108.8 plt 219; glucose 147; bun 25; creat 1.17; k+ 4.6; na++ 147; ca 8.1  06/03/2020: wbc 7.6; hgb 11.4; hct 38.6; mcv 102.7; plt 284; glucose 111; bun 15; creat 0.90; k+ 3.5; na++ 140; ca 8.1 mag 2.0  06-19-20: tsh 0.795 free t4: 0.71; free t3: 2.1 08-08-20: glucose 83; bun 25; creat 0.92; k+ 4.2; n++ 143; ca 9.0; GFR >60 hgb a1c 7.5   NO NEW LABS.   Review of Systems  Unable to perform ROS: Dementia (unable to participate )    Physical Exam Constitutional:      General: He is not in acute distress.    Appearance: He is well-developed. He is obese. He is not diaphoretic.  Neck:     Thyroid: No thyromegaly.  Cardiovascular:     Rate and Rhythm: Normal rate and regular rhythm.     Heart sounds: Normal heart sounds.  Pulmonary:  Effort: Pulmonary effort is normal. No respiratory distress.     Comments: 02 few scattered  Abdominal:     General: Bowel sounds are normal. There is no distension.     Palpations: Abdomen is soft.     Tenderness: There is no abdominal tenderness.  Musculoskeletal:     Cervical back: Neck supple.     Right lower leg: No edema.     Left lower leg: No edema.     Comments: Right hemiplegia with contractures   Lymphadenopathy:     Cervical: No cervical adenopathy.  Skin:    General: Skin is warm and dry.  Neurological:     Mental Status: He is alert. Mental status is at baseline.  Psychiatric:        Mood and Affect: Mood normal.       ASSESSMENT/ PLAN:  TODAY  1. 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

## 2020-09-15 ENCOUNTER — Non-Acute Institutional Stay (SKILLED_NURSING_FACILITY): Payer: Medicare Other | Admitting: Adult Health

## 2020-09-15 DIAGNOSIS — I69391 Dysphagia following cerebral infarction: Secondary | ICD-10-CM | POA: Diagnosis not present

## 2020-09-15 DIAGNOSIS — Z0000 Encounter for general adult medical examination without abnormal findings: Secondary | ICD-10-CM | POA: Diagnosis not present

## 2020-09-15 DIAGNOSIS — I69351 Hemiplegia and hemiparesis following cerebral infarction affecting right dominant side: Secondary | ICD-10-CM | POA: Diagnosis not present

## 2020-09-15 DIAGNOSIS — I639 Cerebral infarction, unspecified: Secondary | ICD-10-CM | POA: Diagnosis not present

## 2020-09-15 DIAGNOSIS — Z515 Encounter for palliative care: Secondary | ICD-10-CM | POA: Diagnosis not present

## 2020-09-15 NOTE — Progress Notes (Signed)
Subjective:   [name] [name] is a 75 y.o. male who presents for Medicare Annual/Subsequent preventive examination.  Review of Systems    Review of Systems  Unable to perform ROS: Dementia (unable to participate )   Cardiac Risk Factors include: advanced age (>26men, >68 women);hypertension;diabetes mellitus;male gender;sedentary lifestyle     Objective:    Today's Vitals   09/15/20 1248 09/15/20 1259  BP: 138/88   Pulse: 80   Resp: 20   Temp: 97.6 F (36.4 C)   SpO2: 94%   Weight: 237 lb 9.6 oz (107.8 kg)   Height: 5' 8.5" (1.74 m)   PainSc:  0-No pain   Body mass index is 35.6 kg/m.  Advanced Directives 08/06/2020 07/30/2020 07/29/2020 07/01/2020 06/12/2020 06/06/2020 06/05/2020  Does Patient Have a Medical Advance Directive? Yes Yes Yes Yes Yes Yes Yes  Type of Advance Directive Out of facility DNR (pink MOST or yellow form) Out of facility DNR (pink MOST or yellow form) Out of facility DNR (pink MOST or yellow form) Out of facility DNR (pink MOST or yellow form) Out of facility DNR (pink MOST or yellow form) Out of facility DNR (pink MOST or yellow form) Out of facility DNR (pink MOST or yellow form)  Does patient want to make changes to medical advance directive? No - Patient declined No - Patient declined No - Patient declined No - Patient declined No - Patient declined No - Patient declined No - Patient declined  Copy of Healthcare Power of Attorney in Chart? - - - - - - -  Would patient like information on creating a medical advance directive? - - - - - - -  Pre-existing out of facility DNR order (yellow form or pink MOST form) - - - - - - -    Current Medications (verified) Outpatient Encounter Medications as of 09/15/2020  Medication Sig  . acetaminophen (TYLENOL) 325 MG tablet Take 650 mg by mouth every 6 (six) hours as needed for moderate pain.   Marland Kitchen apixaban (ELIQUIS) 5 MG TABS tablet Take 5 mg by mouth 2 (two) times daily. For dvt/pe/cva po  . atropine 1 %  ophthalmic solution Place 1 drop under the tongue every 6 (six) hours as needed (excessive oral secretions).   Lucilla Lame [place]-Castor Oil (VENELEX) OINT Apply 1 application topically as directed. to left buttock qshift for prevention Day, Evening, Night  . bisacodyl (DULCOLAX) 10 MG suppository Place 1 suppository (10 mg total) rectally at bedtime.  . carboxymethylcellulose (REFRESH PLUS) 0.5 % SOLN Place 1 drop into the right eye in the morning, at noon, in the evening, and at bedtime.   Marland Kitchen erythromycin ophthalmic ointment Place 1 application into the right eye 2 (two) times daily. Also apply to right eye - dx corneal scar - continue until next ophthalmology visit At Bedtime  . insulin aspart (NOVOLOG FLEXPEN) 100 UNIT/ML FlexPen Inject 15 Units into the skin 2 (two) times daily with a meal. With Lunch and Supper  . Insulin Glargine (BASAGLAR KWIKPEN) 100 UNIT/ML SOPN Inject 31 Units into the skin at bedtime.   Marland Kitchen ipratropium-albuterol (DUONEB) 0.5-2.5 (3) MG/3ML SOLN Take 3 mLs by nebulization every 6 (six) hours.   Marland Kitchen linaclotide (LINZESS) 145 MCG CAPS capsule Take 145 mcg by mouth daily in the afternoon. Special Instructions: Give on an empty stomach at least before lunch. Do not crush. May open capsule and sprinkle contents on applesauce if unable to swallow capsule.  . loratadine (CLARITIN) 10 MG tablet  Take 10 mg by mouth daily.  . magnesium hydroxide (MILK OF MAGNESIA) 400 MG/5ML suspension Take 30 mLs by mouth daily as needed for mild constipation.  . methimazole (TAPAZOLE) 5 MG tablet Take 2.5 mg by mouth daily.  . NON FORMULARY Diet- Liquids: Nectar Thick Liquids  Diet:Dysphagia 1 Puree  . OXYGEN Inhale 3 L into the lungs continuous. Every shift to maintain satuation >90%.  . Sodium Phosphates (FLEET ENEMA RE) Place 1 application rectally daily as needed.  . Vitamins A & D (VITAMIN A & D) ointment Apply 1 application topically in the morning, at noon, and at bedtime. To bilateral  heels   No facility-administered encounter medications on file as of 09/15/2020.    Allergies (verified) Penicillin g and Penicillins   History: Past Medical History:  Diagnosis Date  . Acute respiratory failure with hypoxia [facility])    Per records at [facility] EMR system, [employer]   . Constipation    Per records at [facility] EMR system, [employer]   . Dependence on supplemental oxygen    Per records at [facility] EMR system, [employer]   . DVT (deep venous thrombosis) (HCC) 09/26/2012  . Dysphasia   . Elevated WBC count    Per records at [facility] EMR system, [employer]   . Fatty liver   . Fever, unspecified    Per records at [facility] EMR system, [employer]   . Hemiplegia (HCC)   . Hemorrhoids    Per records at [facility] EMR system, Matrix   . Hyperlipidemia    Per records at [facility] EMR system, Matrix   . Hyperosmolality and hypernatremia    Per records at [facility] EMR system, Matrix   . Hypertension   . Lack of coordination    Per records at [facility] EMR system, Matrix   . Muscle weakness    Per records at [facility] EMR system, Matrix   . Nasal congestion    Per records at [facility] EMR system, Matrix   . Need for assistance with personal care    Per records at [facility] EMR system, Matrix   . 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 Pneumonia   . Pulmonary embolism (HCC)   . Sepsis, unspecified organism [facility])    Per records at [facility] EMR system, [employer]   . 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(HCC)   . Stroke (HCC)   . Type 2 diabetes mellitus with hyperglycemia (HCC)    Per records at [facility] EMR system, Matrix   . Unspecified corneal scar and opacity    Per records at [facility] EMR system, Matrix   . UTI (urinary tract infection)    Per records at [facility] EMR  system, [employer]    Past Surgical History:  Procedure Laterality Date  . ESOPHAGOGASTRODUODENOSCOPY (EGD) WITH PROPOFOL N/A 05/15/2019   Procedure: ESOPHAGOGASTRODUODENOSCOPY (EGD) WITH PROPOFOL;  Surgeon: Lucretia Roers, MD;  Location: [facility] ORS;  Service: General;  Laterality: N/A;  . FLEXIBLE SIGMOIDOSCOPY N/A 10/21/2016   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with successful decompression, few divertiula at sigmoid  . FLEXIBLE SIGMOIDOSCOPY N/A 10/25/2016   sigmoid volvulus with successful decompression, dilated sigmoid colon.   Marland Kitchen PEG PLACEMENT N/A 05/15/2019   Procedure: PERCUTANEOUS ENDOSCOPIC GASTROSTOMY (PEG) PLACEMENT;  Surgeon: Lucretia Roers, MD;  Location: [facility]  ORS;  Service: General;  Laterality: N/A;  . unable     No family history on file. Social History   Socioeconomic History  . Marital status: Single    Spouse name: Not on file  . Number of children: Not on file  . Years of education: Not on file  . Highest education level: Not on file  Occupational History  . Not on file  Tobacco Use  . Smoking status: Never Smoker  . Smokeless tobacco: Never Used  Vaping Use  . Vaping Use: Never used  Substance and Sexual Activity  . Alcohol use: No    Alcohol/week: 0.0 standard drinks  . Drug use: No  . Sexual activity: Not on file  Other Topics Concern  . Not on file  Social History Narrative  . Not on file   Social Determinants of Health   Financial Resource Strain: Not on file  Food Insecurity: Not on file  Transportation Needs: Not on file  Physical Activity: Not on file  Stress: Not on file  Social Connections: Not on file    Tobacco Counseling Counseling given: Not Answered   Clinical Intake:  Pre-visit preparation completed: Yes  Pain : No/denies pain Pain Score: 0-No pain Faces Pain Scale: No hurt  Faces Pain Scale: No hurt  BMI - recorded: 35.6 Nutritional Status: BMI > 30  Obese Nutritional Risks: Unintentional weight gain Diabetes: Yes CBG done?: Yes CBG  resulted in Enter/ Edit results?: Yes Did pt. bring in CBG monitor from home?: No  How often do you need to have someone help you when you read instructions, pamphlets, or other written materials from your doctor or pharmacy?: 5 - Always  Diabetic?yes  Interpreter Needed?: No      Activities of Daily Living In your present state of health, do you have any difficulty performing the following activities: 09/15/2020 05/30/2020  Hearing? Y -  Vision? Y -  Difficulty concentrating or making decisions? Y -  Walking or climbing stairs? Y -  Dressing or bathing? Y -  Doing errands, shopping? Y N  Preparing Food and eating ? Y -  Using the Toilet? Y -  In the past six months, have you accidently leaked urine? Y -  Do you have problems with loss of bowel control? Y -  Managing your Medications? Y -  Managing your Finances? Y -  Housekeeping or managing your Housekeeping? Y -  Some recent data might be hidden    Patient Care Team: Sharee HolsterGreen, Deborah S, NP as PCP - General (Geriatric Medicine) [facility]  Indicate any recent Medical Services you may have received from other than [facility] providers in the past year (date may be approximate).     Assessment:   This is a routine wellness examination for Nemiah.  Hearing/Vision screen No exam data present  Dietary issues and exercise activities discussed: Current Exercise Habits: The patient does not participate in regular exercise at present, Exercise limited by: neurologic condition(s)  Goals    . Absence of Fall and Fall-Related Injury     Evidence-based guidance:   Assess fall risk using a validated tool when available. Consider balance and gait impairment, muscle weakness, diminished vision or hearing, environmental hazards, presence of urinary or bowel urgency and/or incontinence.   Communicate fall injury risk to interprofessional healthcare team.   Develop a fall prevention plan with the  patient and family.   Promote use of personal vision and auditory aids.   Promote  reorientation, appropriate sensory stimulation, and routines to decrease risk of fall when changes in mental status are present.   Assess assistance level required for safe and effective self-care; consider referral for home care.   Encourage physical activity, such as performance of self-care at highest level of ability, strength and balance exercise program, and provision of appropriate assistive devices; refer to rehabilitation therapy.   Refer to community-based fall prevention program where available.   If fall occurs, determine the cause and revise fall injury prevention plan.   Regularly review medication contribution to fall risk; consider risk related to polypharmacy and age.   Refer to pharmacist for consultation when concerns about medications are revealed.   Balance adequate pain management with potential for oversedation.   Provide guidance related to environmental modifications.   Consider supplementation with Vitamin D.   Notes:     . Follow up with Primary Care Provider    . General - Client will not be readmitted within 30 days ([facility])      Depression Screen PHQ 2/9 Scores 09/15/2020 09/14/2019 02/05/2019 03/27/2018 04/05/2017 03/17/2017  PHQ - 2 Score - 0 0 0 0 0  Exception Documentation Other- indicate reason in comment box - - - - -  Not completed unable to participate - - - - -    Fall Risk Fall Risk  09/15/2020 09/14/2019 02/05/2019 03/27/2018 04/05/2017  Falls in the past year? 1 0 0 No No  Number falls in past yr: 1 - - - -  Injury with Fall? 0 - - - -  Risk for fall due to : History of fall(s);Impaired balance/gait;Impaired mobility - Impaired balance/gait;Impaired mobility - Impaired balance/gait;Impaired mobility;Medication side effect;Impaired vision;History of fall(s)  Follow up Falls evaluation completed - - - -    FALL RISK PREVENTION PERTAINING TO THE HOME:  Any  stairs in or around the home? no If so, are there any without handrails? n/a Home free of loose throw rugs in walkways, pet beds, electrical cords, etc? yes Adequate lighting in your home to reduce risk of falls? Yes   ASSISTIVE DEVICES UTILIZED TO PREVENT FALLS:  Life alert? n/a Use of a cane, Jian or w/c?bedridden  Grab bars in the bathroom? Yes  Shower chair or bench in shower? Yes  Elevated toilet seat or a handicapped toilet? Yes   TIMED UP AND GO:  Was the test performed? no patient is bed ridden   Cognitive Function: MMSE - Mini Mental State Exam 09/15/2020  Not completed: Unable to complete     6CIT Screen 09/14/2019 03/27/2018 03/17/2017  What Year? (No Data) 0 points 4 points  What month? - 0 points 0 points  What time? - 0 points 0 points  Count back from 20 - 0 points 0 points  Months in reverse - 4 points 4 points  Repeat phrase - 6 points 6 points  Total Score - 10 14    Immunizations Immunization History  Administered Date(s) Administered  . Influenza-Unspecified 03/28/2014, 03/23/2016, 03/25/2017, 03/23/2018, 03/26/2019, 03/28/2020  . Moderna Sars-Covid-2 Vaccination 06/27/2019, 07/25/2019, 04/24/2020  . Pneumococcal Conjugate-13 04/21/2015  . Pneumococcal-Unspecified 11/01/2009, 03/30/2016  . Tdap 03/17/2017   Vaccines per facility  Screening Tests Health Maintenance  Topic Date Due  . COLONOSCOPY (Pts 45-76yrs Insurance coverage will need to be confirmed)  10/30/2025 (Originally [birth year]1990)  . HEMOGLOBIN A1C  02/05/2021  . FOOT EXAM  02/14/2021  . TETANUS/TDAP  03/18/2027  . INFLUENZA VACCINE  Completed  . COVID-19 Vaccine  Completed  .  Hepatitis C Screening  Completed  . PNA vac Low Risk Adult  Completed  . HPV VACCINES  Aged Out  . OPHTHALMOLOGY EXAM  Discontinued  . URINE MICROALBUMIN  Discontinued    Health Maintenance  There are no preventive care reminders to display for this patient.   Lung Cancer Screening: (Low Dose CT Chest  recommended if Age 60-80 years, 30 pack-year currently smoking OR have quit w/in 15years.) does not  qualify.   Lung Cancer Screening Referral: n/a   Additional Screening:  Hepatitis C Screening: doe snot qualify   Vision Screening: Recommended annual ophthalmology exams for early detection of glaucoma and other disorders of the eye. Is the patient up to date with their annual eye exam? yes  Who is the provider or what is the name of the office in which the patient attends annual eye exams? Per facility  If pt is not established with a provider, would they like to be referred to a provider to establish care? N/a .   Dental Screening: Recommended annual dental exams for proper oral hygiene  Community Resource Referral / Chronic Care Management: CRR required this visit?  no CCM required this visit?  No      Plan:     I have personally reviewed and noted the following in the patient's chart:   . Medical and social history . Use of alcohol, tobacco or illicit drugs  . Current medications and supplements . Functional ability and status . Nutritional status . Physical activity . Advanced directives . List of other physicians . Hospitalizations, surgeries, and ER visits in previous 12 months . Vitals . Screenings to include cognitive, depression, and falls . Referrals and appointments  In addition, I have reviewed and discussed with patient certain preventive protocols, quality metrics, and best practice recommendations. A written personalized care plan for preventive services as well as general preventive health recommendations were provided to patient.     Sharee Holster, NP   09/15/2020

## 2020-09-15 NOTE — Patient Instructions (Signed)
  [name] , Thank you for taking time to come for your Medicare Wellness Visit. I appreciate your ongoing commitment to your health goals. Please review the following plan we discussed and let me know if I can assist you in the future.   These are the goals we discussed: Goals    . Absence of Fall and Fall-Related Injury     Evidence-based guidance:   Assess fall risk using a validated tool when available. Consider balance and gait impairment, muscle weakness, diminished vision or hearing, environmental hazards, presence of urinary or bowel urgency and/or incontinence.   Communicate fall injury risk to interprofessional healthcare team.   Develop a fall prevention plan with the patient and family.   Promote use of personal vision and auditory aids.   Promote reorientation, appropriate sensory stimulation, and routines to decrease risk of fall when changes in mental status are present.   Assess assistance level required for safe and effective self-care; consider referral for home care.   Encourage physical activity, such as performance of self-care at highest level of ability, strength and balance exercise program, and provision of appropriate assistive devices; refer to rehabilitation therapy.   Refer to community-based fall prevention program where available.   If fall occurs, determine the cause and revise fall injury prevention plan.   Regularly review medication contribution to fall risk; consider risk related to polypharmacy and age.   Refer to pharmacist for consultation when concerns about medications are revealed.   Balance adequate pain management with potential for oversedation.   Provide guidance related to environmental modifications.   Consider supplementation with Vitamin D.   Notes:     . Follow up with Primary Care Provider    . General - Client will not be readmitted within 30 days ([facility])       This is a list of the screening recommended for you and due  dates:  Health Maintenance  Topic Date Due  . Colon Cancer Screening  10/30/2025*  . Hemoglobin A1C  02/05/2021  . Complete foot exam   02/14/2021  . Tetanus Vaccine  03/18/2027  . Flu Shot  Completed  . COVID-19 Vaccine  Completed  .  Hepatitis C: One time screening is recommended by Center for Disease Control  (CDC) for  adults born from [birth year] through 1965.   Completed  . Pneumonia vaccines  Completed  . HPV Vaccine  Aged Out  . Eye exam for diabetics  Discontinued  . Urine Protein Check  Discontinued  *Topic was postponed. The date shown is not the original due date.

## 2020-10-19 DEATH — deceased
